# Patient Record
Sex: Female | Born: 1975
Health system: Western US, Academic
[De-identification: ages and names within clinical notes are randomized; demographics above are authoritative.]

## PROBLEM LIST (undated history)

## (undated) DIAGNOSIS — I8501 Esophageal varices with bleeding: Principal | ICD-10-CM

## (undated) DIAGNOSIS — Z5189 Encounter for other specified aftercare: Secondary | ICD-10-CM

## (undated) DIAGNOSIS — K219 Gastro-esophageal reflux disease without esophagitis: Secondary | ICD-10-CM

## (undated) DIAGNOSIS — Z09 Encounter for follow-up examination after completed treatment for conditions other than malignant neoplasm: Secondary | ICD-10-CM

## (undated) DIAGNOSIS — F32A Depression, unspecified: Secondary | ICD-10-CM

## (undated) DIAGNOSIS — F419 Anxiety disorder, unspecified: Secondary | ICD-10-CM

## (undated) DIAGNOSIS — F1020 Alcohol dependence, uncomplicated: Secondary | ICD-10-CM

## (undated) DIAGNOSIS — N920 Excessive and frequent menstruation with regular cycle: Secondary | ICD-10-CM

## (undated) DIAGNOSIS — F1010 Alcohol abuse, uncomplicated: Secondary | ICD-10-CM

## (undated) DIAGNOSIS — R011 Cardiac murmur, unspecified: Secondary | ICD-10-CM

## (undated) DIAGNOSIS — J189 Pneumonia, unspecified organism: Secondary | ICD-10-CM

## (undated) DIAGNOSIS — D649 Anemia, unspecified: Secondary | ICD-10-CM

## (undated) DIAGNOSIS — F329 Major depressive disorder, single episode, unspecified: Secondary | ICD-10-CM

## (undated) DIAGNOSIS — D61818 Other pancytopenia: Secondary | ICD-10-CM

## (undated) DIAGNOSIS — K922 Gastrointestinal hemorrhage, unspecified: Secondary | ICD-10-CM

## (undated) DIAGNOSIS — Z9289 Personal history of other medical treatment: Secondary | ICD-10-CM

## (undated) DIAGNOSIS — K766 Portal hypertension: Secondary | ICD-10-CM

## (undated) DIAGNOSIS — K7460 Unspecified cirrhosis of liver: Secondary | ICD-10-CM

## (undated) HISTORY — DX: Alcohol dependence, uncomplicated: F10.20

## (undated) HISTORY — DX: Encounter for other specified aftercare: Z51.89

## (undated) HISTORY — DX: Cardiac murmur, unspecified: R01.1

## (undated) HISTORY — DX: Major depressive disorder, single episode, unspecified: F32.9

## (undated) HISTORY — DX: Pneumonia, unspecified organism: J18.9

## (undated) HISTORY — DX: Anxiety disorder, unspecified: F41.9

## (undated) HISTORY — PX: CHOLECYSTECTOMY: SHX55

## (undated) HISTORY — DX: Depression, unspecified: F32.A

## (undated) NOTE — Progress Notes (Signed)
 Formatting of this note is different from the original. CASE MANAGEMENT ACUTE TRANSFER NOTE   Patient stable for transfer: Stable for ED Transfer?: Yes (++) Stabillity Determined by ED Physician (Time): 1537 Name of ED Physician: Dr. Hodges Diagnosis/PMH: Pneumonia Reason for ED Transfer: Reason for ED Transfer : Non-Member Type of bed requested: Type of Bed Requested: Telemetry Financial Counselor informed for payer identification: Artist Informed for Payer Identification: Yes (++) Health Plan Name and Phone Number: No insurance found according to the financial counselor   Patient has Medicare FFS?:  (No)    1727 Call to the financial counselor, spoke with Claud who communicated he has not been able to verify any insurance via [employer] site and nothing showed via pt's social security number.  Provided above information regarding patient stability and request for transfer.  Per Dr. Hodges  1730 Call to the [facility] at [phone] s/w coordinator #96 Elsie who communicated they are closed to transfers due to capacity.   1813 Call from the financial counselor who communicated pt has been confirmed homeless so will provide homeless medical assistance for pt.    Updated primary MD : Name:Dr. Boyarsky to above plan.   Please call case manager for any questions/changes in patient's status.   Case manager to remain available for any emerging needs/issues.   -------------------------------------------------------------------------------------------------------------------  In HOSPITAL CHART ONLY: Click here to launch flowsheet to view complete UM CM Assessment/Discharge Plan.  This link is not intended to be used in Chart Review.   Electronically signed by: OMAR GORMAN GRIFFIN RN Case Manager 01/06/2023 5:26 PM  Electronically signed by Griffin Omar GORMAN (R.N.), R.N. at 01/06/2023  6:14 PM PST
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
## (undated) NOTE — Progress Notes (Signed)
 Formatting of this note might be different from the original. SUMMARY OF PATIENT PROGRESS:  STATUS: Improving  Primary Problem: Covid PNE  Key findings related to primary problem: Pt is alert and oriented x 4   Other key findings: Pt ambulates around room independently   Pt is stating she feels fine without oxygen, 92% RA . Nurse provided education Pt will be d/c to family house , need for oxygen CM and MD working on d/c and making sure she has everything she needs  Discharge will have to be tomorrow due to her personal insurance being closed today    Problem: Adult Inpatient Plan of Care Goal: Plan of Care Review Outcome: Progressing Goal: Patient-Specific Goal (Individualized) Outcome: Progressing Goal: Absence of Hospital-Acquired Illness or Injury Outcome: Progressing Goal: Optimal Comfort and Wellbeing Outcome: Progressing  Problem: Alcohol  Withdrawal Goal: Alcohol  Withdrawal Symptom Control Outcome: Progressing Goal: Optimal Neurologic Function Outcome: Progressing Goal: Readiness for Change Identified Outcome: Progressing  Problem: Sepsis/Septic Shock Goal: Optimal Coping Outcome: Progressing Goal: Absence of Bleeding Outcome: Progressing Goal: Blood Glucose Level Within Targeted Range Outcome: Progressing Goal: Absence of Infection Signs and Symptoms Outcome: Progressing  Problem: Infection Goal: Absence of Infection Signs and Symptoms Outcome: Progressing  Problem: Fall Injury Risk Goal: Absence of Fall and Fall-Related Injury Outcome: Progressing  Problem: Confusion Acute Goal: Optimal Cognitive Function Outcome: Progressing  Problem: Pneumonia Goal: Fluid Balance Outcome: Progressing Goal: Resolution of Infection Signs and Symptoms Outcome: Progressing Goal: Effective Oxygenation and Ventilation Outcome: Progressing  Problem: HD Fall Risk Goal: Patient will remain as independent as possible. Patient will have lower fall risk, lower injury  risk, and remain safe from falls and injury Outcome: Progressing  Electronically signed by Fleeta Moulder, Shona HERO (R.N.), R.N. at 01/15/2023  4:30 PM PST Electronically signed by Fleeta Moulder, Tianna M (R.N.), R.N. at 01/15/2023  6:47 PM PST

## (undated) NOTE — ED Provider Notes (Signed)
 Associated Order(s): Paracentesis Formatting of this note is different from the original. HPI  Review of Systems  Physical Exam  Paracentesis  Date/Time: 01/26/2024 12:18 AM  Performed by: Siani, Avi Johnatan (M.D.), M.D. Authorized by: Siani, Avi Johnatan (M.D.), M.D.   Consent:    Consent obtained:  Verbal   Consent given by:  Patient   Risks discussed:  Bleeding, bowel perforation, infection and pain Universal protocol:    Patient identity confirmed:  Verbally with patient Pre-procedure details:    Procedure purpose:  Diagnostic   Preparation: Patient was prepped and draped in usual sterile fashion   Anesthesia:    Anesthesia method:  Local infiltration   Local anesthetic:  Lidocaine  1% w/o epi Procedure details:    Needle gauge:  22   Ultrasound guidance: yes     Puncture site:  R lower quadrant   Fluid appearance:  Serous   Dressing:  Adhesive bandage Post-procedure details:    Procedure completion:  Tolerated well, no immediate complications  ED Course as of 01/26/24 0018  Thu Jan 25, 2024  2010 ECG: NSR at 85 bmp, nonspecific ST abnormality, no STE or depressions , no signs of acute ischemia or life threatening arrhythmia [KB]  2150 CTA PULMONARY W CONTRAST Wet Read From the [employer] Application: Radiologist's Findings: No pulmonary embolism, consolidation, or pleural effusion. For findings regarding the abdomen, please refer to the CT abdomen and pelvis report. Radiologist: Lenton Lose [KB]  2151 CT ABD AND PELVIS W CONTRAST Wet Read From the [employer] Application: Radiologist's Findings: Cirrhotic liver. Moderate free fluid in the abdomen and pelvis. Recanalized paraumbilical vein. Left splenorenal shunt. Thickening of small bowel loops in the upper pelvis and lower abdomen. Inflammation versus findings related to hypoproteinemia. Radiologist: Lenton Lose [KB]  2152 Patient persistently hypoxic, on chart review has history of respiratory failure, no PE on CTA, possible  hypoxia secondary to worsening fluid overload [KB]  2250 US  ABD LTD IMPRESSION:  * HEPATOMEGALY.  * CIRRHOSIS.  * REVERSE FLOW IN THE MAIN PORTAL VEIN.  * THE COMMON BILE DUCT IS DILATED.  * ASCITES. Preliminary prepared by Oneil RAMAN. Fructuoso, DITSonographer-LA on  01/25/2024 10:29:06 PM. [KB]  2251 MD to MD: [place] Community: Dr. Melvern: accepts the patient [KB]  2311 MD to MD Dr. Saunders: accepts the patient [KB]  2316 [place] community has no beds so patient will be transferred to [facility] [KB]   ED Course User Index [KB] Badashova, Ksenya Konstantinovna (M.D.), M.D.    Electronically signed by Siani, Avi Johnatan (M.D.), M.D. at 01/26/2024 12:19 AM PST

## (undated) NOTE — Progress Notes (Signed)
 Formatting of this note might be different from the original. MID-SHIFT CARE PLAN  UPDATE:  Patient Problem: Covid PNA , chemical dependency  Key findings related to problem: Patient is calm, VSS, no distress noted throughout shift. Psychiatric consult completed via video. Patient is agreeable to rehabilatation. COVID isolation in place at this time. Patient is being monitored for CIWA scale, no need for ativan  throughout shift. Other medications on board to deal with the withdrawal symptoms. Will cont to monitor. Electronically signed by Alphonso Alm Kenning (R.N.), R.N. at 01/10/2023  4:21 PM PST

## (undated) NOTE — ED Notes (Signed)
 Formatting of this note might be different from the original. US  tech at pt's bedside Electronically signed by Mevelyn Pierce (R.N.), R.N. at 01/25/2024  9:34 PM PST
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
## (undated) NOTE — Progress Notes (Signed)
 Formatting of this note is different from the original. MEDICAL SOCIAL WORKER PROGRESS / FOLLOW-UP NOTE:  PRESENTING ISSUE:  30 year old female with past medical history of bipolar, depression,homelessness,history of etoh cirrhosis, esophageal varices, past pancreatitis, etoh abusepresents with etoh intoxication.  ASSESSMENT:  IA 01/09/2023.  INTERVENTION:  Phone call from [place] From [place], patient has [employer]. LCSW updated IP CM on coverage and for her to be aware as well.   PLAN: LCSW to follow   Jackolyn Kays, LCSW  Department of Social Medicine [facility] [place]  [phone]  Date: 01/10/2023 Time: 11:06 AM  Electronically signed by Kays Jackolyn Macintosh (L.C.S.W.), L.C.S.W at 01/10/2023 11:08 AM PST

## (undated) NOTE — Progress Notes (Signed)
 Formatting of this note might be different from the original. Pt axox4. CIWA, see flowsheet. On supp O2, see flowsheet. BPs soft, MD notified and aware, see orders, NS @ 100. K repleted w PO K. Call light within reach. Care continues Problem: Alcohol  Withdrawal Goal: Alcohol  Withdrawal Symptom Control Outcome: Progressing  Problem: Adult Inpatient Plan of Care Goal: Absence of Hospital-Acquired Illness or Injury Outcome: Progressing  Electronically signed by Roetta Maw (R.N.), R.N. at 01/11/2023  7:08 PM PST

## (undated) NOTE — ED Notes (Signed)
 Formatting of this note might be different from the original. PT leaving AMA; pt refused to sign the AMA form; left the room and walked out of the ER;  pt was instructed by Dr. Effie that it's dangerous for her to leave due to her chronic low oxygen rate; she was 86% when she came in; and she is on oxygen at home.  Electronically signed by Costella Quintin CROME (R.N.), R.N. at 04/05/2023 11:12 PM PDT
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
## (undated) NOTE — Progress Notes (Signed)
 Formatting of this note is different from the original. MEDICAL SOCIAL WORKER PROGRESS / FOLLOW-UP NOTE:  PRESENTING ISSUE: 88 year old female with past medical history of bipolar, depression,homelessness,history of etoh cirrhosis, esophageal varices, past pancreatitis, etoh abusepresents with etoh intoxication.   ASSESSMENT:  IA conducted 01/09/23. Patient is homeless with COVID and on 02. Spoke to sister re: dcp.   INTERVENTION: LCSW conferred with Team SW for prior interventions. Team SW to fu   PLAN: Soc med team to follow   Jackolyn Kays, LCSW  Department of Social Medicine [facility] [place]  [phone]  Date: 01/11/2023 Time: 9:15 AM  Electronically signed by Kays Jackolyn Macintosh (L.C.S.W.), L.C.S.W at 01/11/2023  9:15 AM PST

## (undated) NOTE — Progress Notes (Signed)
 Formatting of this note might be different from the original. Glucose 217 Electronically signed by Prescott Garre (R.N.), R.N. at 01/09/2023  6:55 PM PST
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
## (undated) NOTE — Discharge Summary (Signed)
 Formatting of this note is different from the original. Case Management Discharge Summary Note    DISCHARGE TO HOME  Team 11  Chart reviewed / case discussed with rounding MD.    Patient Address: [address] [place] [address] Phone:   Home Phone     [phone] Work Phone     Data Not Entered Address/Phone confirmed: Yes Transportation: Discharge Transportation: Other, specify [place]),   Medicare IM Letter Issued On: N/A   Discharge Plan: Disposition / Placement: Discharge Disposition Plan: Other, Specify (++)   Discharge Referrals: Discharge Referrals [facility]): Social Work, specify Infusion Center:   Discharge DME:    LONG VIEW OF CARE PLAN   Nutrition Resources:Nutrition: Yes (++)  Resource Provided:  ,  , Discharge Nutrition Resources: [employer] for [employer];Food Pantry,  . Details:     Exercise/Socialization: Exercise/Socialization: No Needs Identified Resource Provided:  ,  ,   Details:    Caregiver Resources: CareGiver: No Needs Identified  Resource Provided:  ,  ,   Details:    Transportation Resources: Transportation: No Needs Identified  Resource Provided:  ,  ,   Details:    AD/POLST: Advance Directive: No Needs Identified / POLST: No Needs Identified Resource Provided:   /   Details:   /    RESOURCES (other resources) :   Resource Provided:  ,  ,   Details:     CONCLUSION    01/16/2023-  CM called [employer] at [phone]. spoke with [employer] Supervisor Tania. Per Supervisor patient can self pay for oxygen.   CM spoke with patient who stated she will self pay for oxygen concentrator and portable tank.   CM provided patient with portable oxygen tank and concentrator after getting confirmation from [employer] that patient had paid co-pay.  Patient stated she will be discharging to her mother-in-law's home. Address [address] [place] [place] [address].  Discharge Disposition- Home via [facility] transportation  with DME.   Discharge plan developed with: Discharge plan developed with: Patient. Patient's post-discharge goals of care and treatment preferences considered in discharge plan.  Patient/family/caregiver in agreement and aware of plans.  Above plans communicated with DC plan communicated with: MD (++),       Name of MD: Dr. Theresa  . Case manager to remain available for any emerging discharge planning needs/issues. Please call case manager for any questions/changes in patient's status. ------------------------------------------------------------------------------------------------------------ In HOSPITAL CHART ONLY: Click here to launch flowsheet to view complete UM CM Assessment/Discharge Plan.  This link is not intended to be used in Chart Review.   Electronically signed by: RAGUEL PARKINSON RN Case Manager 01/16/2023 1:52 PM  Electronically signed by Parkinson Raguel (R.N.), R.N. at 01/16/2023  3:14 PM PST

## (undated) NOTE — Progress Notes (Signed)
 Formatting of this note might be different from the original. 1600: Called [employer] health plan [phone]) to follow up on oxygen DME orders. Confirmed patient has no out of network benefits.   Sherleen ID #: [id number] Dx code: Problem:COVID-19 PNEUMONIA [U07.1,J12.82] CPT Oxygen Codes. Procedure Description. Z8609. OXYGEN CONCENTRATOR. Z9575.   [employer] rep, Con  then assisted with getting patient an emergency inpatient authorization. Once emergency authorization has been given the oxygen can potentially be approved within 24-48 hrs once a nurse has reviewed clinicals.   Pending case #: [id number] Clincials and DMD orders faxed to: [phone]  Electronically signed by: KENNYTH LAMAR FLEETA DERECK RN Inpatient Case Manager Desk: [phone] Vocera: [phone] 01/10/2023 4:41 PM     Electronically signed by Fleeta Dereck, Kennyth Lamar (R.N.), R.N. at 01/10/2023  4:41 PM PST
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
## (undated) NOTE — Progress Notes (Signed)
 Formatting of this note might be different from the original. Glucose 173 Electronically signed by Prescott Garre (R.N.), R.N. at 01/09/2023  4:49 PM PST

## (undated) NOTE — Progress Notes (Signed)
 Formatting of this note might be different from the original. Discharge education given to patient. Questions answered. DME oxygen tank and concentrator delivered to bedside. PIV removed. Awaiting butterfly transportation   Electronically signed by Gerrit Longs (R.N.), R.N. at 01/16/2023  3:09 PM PST

## (undated) NOTE — Progress Notes (Signed)
 Formatting of this note might be different from the original. SUMMARY OF PATIENT PROGRESS:  STATUS: Improving  Primary Problem: Respiratory distress, Alcohol  Intoxication  Key findings related to primary problem: A&Ox4, VSS, afebrile, No resp distress noted, NSR, [place] 2L>94%, CIWA=4-5. Denies n/v/d/chest pain. Ambulating independently to/from bathroom, Tolerates PO regular diet and voiding spontaneously w/o difficulties.  Other key findings: Awaiting Physical therapy consultation, and DC to home.  Problem: Adult Inpatient Plan of Care Goal: Plan of Care Review Outcome: Progressing Goal: Patient-Specific Goal (Individualized) Outcome: Progressing Goal: Absence of Hospital-Acquired Illness or Injury Outcome: Progressing Goal: Optimal Comfort and Wellbeing Outcome: Progressing  Problem: Alcohol  Withdrawal Goal: Alcohol  Withdrawal Symptom Control Outcome: Progressing Goal: Optimal Neurologic Function Outcome: Progressing Goal: Readiness for Change Identified Outcome: Progressing  Problem: Sepsis/Septic Shock Goal: Optimal Coping Outcome: Progressing Goal: Absence of Bleeding Outcome: Progressing Goal: Blood Glucose Level Within Targeted Range Outcome: Progressing Goal: Absence of Infection Signs and Symptoms Outcome: Progressing  Problem: Infection Goal: Absence of Infection Signs and Symptoms Outcome: Progressing  Problem: Fall Injury Risk Goal: Absence of Fall and Fall-Related Injury Outcome: Progressing  Problem: Confusion Acute Goal: Optimal Cognitive Function Outcome: Progressing  Problem: Pneumonia Goal: Fluid Balance Outcome: Progressing Goal: Resolution of Infection Signs and Symptoms Outcome: Progressing Goal: Effective Oxygenation and Ventilation Outcome: Progressing  Problem: HD Fall Risk Goal: Patient will remain as independent as possible. Patient will have lower fall risk, lower injury risk, and remain safe from falls and injury Outcome:  Progressing  Electronically signed by Leontine Coy (R.N.), R.N. at 01/15/2023  6:35 AM PST

## (undated) NOTE — ED Provider Notes (Signed)
 Formatting of this note is different from the original. [facility] Emergency Department Physician Note  Please Note that this electronic medical record is being dictated and as such may contain transcription and typographical errors due to the limitations of the dictation/transcription software.  ASSESSMENT ALCOHOLIC CIRRHOSIS W ASCITES  (primary encounter diagnosis) GENERALIZED ABDOMINAL PAIN NAUSEA AND VOMITING SHORTNESS OF BREATH HYPOXIA  PLAN -Diagnostic Paracentesis -Pain management -O2 -Transfer  MDM 73 year old female with MHx severe alcohol  use disorder, cirrhosis, esophageal varices, HTN, thrombocytopenia, bipolar disorder, anxiety; presents with 2 weeks of worsening lower extremity and abdominal swelling and distension associated with intermittent daily emesis and exertional shortness of breath as well as worsening yellowing of skin and eyes. On exam patient with scleral icterus, jaundice, bilateral nonpitting lower extremity edema, abdomen is nondistended, soft nontender without rebound or guarding, no right upper quadrant tenderness, no CVA tenderness, cardiac and lung exam is nonfocal.  Basic labs were obtained and were remarkable for worsening direct and total bilirubin with elevation to 5.1 and 11.4 respectively. Elevated AST ALT and lipase which are stable when compared to prior. Findings are consistent with chronic alcohol  use. Patient has history of chronic respiratory failure and presents with worsening shortness of breath, here in the ED patient is hypoxic to 91% on room air, on chart review this is lower than her baseline, patient comes up on 2 L nasal cannula.  D-dimer was elevated however CTA showed no focal infiltrate, effusion or PE.  Given abdominal pain diagnostic paracentesis was performed.  Patient will require admission, Dr. Melvern from [facility] kindly accepts the patient.  Discussed with her in detail her diagnosis(es), required testing (if  needed), follow up appointments (if needed), treatment options, treatment plan and risks, benefits, and potential side effects of treatment options. ____________________________________________________________________________________  ED Course as of 01/25/24 2318  Thu Jan 25, 2024  2010 ECG: NSR at 85 bmp, nonspecific ST abnormality, no STE or depressions , no signs of acute ischemia or life threatening arrhythmia [KB]  2150 CTA PULMONARY W CONTRAST Wet Read From the [employer] Application: Radiologist's Findings: No pulmonary embolism, consolidation, or pleural effusion. For findings regarding the abdomen, please refer to the CT abdomen and pelvis report. Radiologist: Lenton Lose [KB]  2151 CT ABD AND PELVIS W CONTRAST Wet Read From the [employer] Application: Radiologist's Findings: Cirrhotic liver. Moderate free fluid in the abdomen and pelvis. Recanalized paraumbilical vein. Left splenorenal shunt. Thickening of small bowel loops in the upper pelvis and lower abdomen. Inflammation versus findings related to hypoproteinemia. Radiologist: Lenton Lose [KB]  2152 Patient persistently hypoxic, on chart review has history of respiratory failure, no PE on CTA, possible hypoxia secondary to worsening fluid overload [KB]  2250 US  ABD LTD IMPRESSION:  * HEPATOMEGALY.  * CIRRHOSIS.  * REVERSE FLOW IN THE MAIN PORTAL VEIN.  * THE COMMON BILE DUCT IS DILATED.  * ASCITES. Preliminary prepared by Oneil RAMAN. Fructuoso, DITSonographer-[place] on  01/25/2024 10:29:06 PM. [KB]  2251 MD to MD: [place] [facility]: Dr. Melvern: accepts the patient [KB]  2311 MD to MD Dr. Saunders: accepts the patient [KB]  2316 [place] [facility] has no beds so patient will be transferred to [facility] [KB]   ED Course User Index [KB] Badashova, Ksenya Konstantinovna (M.D.), M.D.   No results found. Procedures _______________________________________________________________________________________ SUBJECTIVE, OBJECTIVE, & PRESENTING INFORMATION    TRIAGE NURSE PRESENTING HISTORY Information provided by: Patient History of Present Illness: 23 y/o F here for eval for 2 weeks of chest  pain and abd distention w/ n/v, progressively getting worse, no blood in stool or emesis, afebrile.  Pt w/ low saturation, no resp distress. General Appearance-DistressLevel: MODERATE Industrial Related: NO   Sara [name] is a 74 year old female who presents with a chief complaint of CHEST PAIN and ABDOMINAL PAIN  History obtained from the patient.  If patient had a preferred language, a translator was offered If an EPRP transfer occured the note was reviewed. Any pertinent information was copied into this note.   6 year old female with MHx severe alcohol  use disorder, esophageal varices, HTN, thrombocytopenia, bipolar disorder, anxiety; presents with 2 weeks of worsening lower extremity and abdominal swelling and distension associated with intermittent daily emesis and exertional shortness of breath as well as worsening yellowing of skin and eyes. Denies hematemesis, melena, hematochezia. Denies chest pain. Denies URI symptoms, cough. Patient reports daily alcohol  use for 6 months and almost daily alcohol  use for years. Reports intermittent cocaine use when I can get it, last was weeks ago. Denies, fever, chills.   Patient Active Problem List  Diagnosis  ? SEVERE ALCOHOL  USE DISORDER  ? HX OF DEPRESSION  ? ESOPHAGEAL VARICES  ? BIPOLAR 1 DISORDER, UNSPECIFIED EPISODE  ? GENERALIZED ANXIETY DISORDER  ? HOMELESSNESS, UNSPECIFIED  ? PORTAL HTN  ? ELEVATED TRANSAMINASE  ? ALCOHOL  INTOXICATION  ? BIPOLAR 2 DISORDER, DEPRESSED EPISODE W ASSOCIATED FEATURES  ? HX OF THROMBOCYTOPENIA   I have reviewed the Past Medical History, past surgical history, medications, and social history as noted in this record.  ---------------- Review of Systems Vitals:   01/25/24 1942 01/25/24 1943 01/25/24 2024  BP: 117/69  110/79  BP Location: RA-RIGHT ARM   RA-RIGHT ARM  Pulse: 97  83  Resp: 18  17  Temp: 37.2 C (98.9 F)    SpO2: (!) 91% 92% 92%  Weight: 69.7 kg (153 lb 9.6 oz)    Height: 1.6 m (5' 3)     Physical Exam Vitals reviewed.  Constitutional:      General: She is not in acute distress.    Appearance: She is well-developed and normal weight. She is not ill-appearing, toxic-appearing or diaphoretic.  HENT:     Head: Atraumatic.  Eyes:     Extraocular Movements: Extraocular movements intact.     Pupils: Pupils are equal, round, and reactive to light.  Neck:     Vascular: No JVD.  Cardiovascular:     Rate and Rhythm: Normal rate and regular rhythm. No extrasystoles are present.    Heart sounds: Normal heart sounds. Heart sounds not distant. No murmur heard.    No friction rub. No gallop. No S3 or S4 sounds.  Pulmonary:     Effort: Pulmonary effort is normal. No tachypnea, accessory muscle usage or respiratory distress.     Breath sounds: No stridor. No decreased breath sounds, wheezing, rhonchi or rales.  Chest:     Chest wall: No tenderness or edema.  Abdominal:     Tenderness: There is no abdominal tenderness. There is no guarding or rebound.  Musculoskeletal:     Cervical back: Normal range of motion and neck supple.     Right lower leg: No tenderness. Edema (2+ bilat) present.     Left lower leg: No tenderness. Edema present.  Skin:    General: Skin is warm and dry.     Capillary Refill: Capillary refill takes less than 2 seconds.  Neurological:     General: No focal deficit present.  Mental Status: She is alert and oriented to person, place, and time.  Psychiatric:        Mood and Affect: Mood normal.        Behavior: Behavior normal.   ED COURSE Orders placed for this ED visit are as follows: Orders Placed This Encounter  ? XR CHEST 1 VIEW  ? US  ABD LTD  ? CTA PULMONARY W CONTRAST  ? CT ABD AND PELVIS W CONTRAST  ? CBC W AUTOMATED DIFFERENTIAL  ? ELECTROLYTE PANEL (NA, K, CL, CO2, ANION GAP)  ? BUN   ? CREATININE  ? GLUCOSE  ? TROPONIN I, HIGH SENSITIVITY  ? B-TYPE NATRIURETIC PEPTIDE (BNP)  ? LIPASE  ? ALT  ? AST  ? BILIRUBIN, TOTAL  ? BILIRUBIN, DIRECT  ? ALKALINE PHOSPHATASE  ? CALCIUM   ? MAGNESIUM   ? HCG, PREGNANCY, SERUM  ? URINALYSIS, AUTOMATED W REFLEX TO MICROSCOPY AND CULTURE  ? ALCOHOL  (ETOH) LEVEL, BLOOD  ? INR  ? WBC AUTO DIFF  ? D-DIMER FOR PULMONARY EMBOLISM, QUANTITATIVE  ? SARS-COV-2 (COVID-19), INFLUENZA A + B, MULTIPLEX NAA  ? LDH, PERITONEAL FLUID  ? AMYLASE, PERITONEAL FLUID  ? PROTEIN, PERITONEAL FLUID  ? CELL COUNT W DIFFERENTIAL, BODY FLUID  ? STERILE SITE CULTURE  ? GRAM STAIN  ? ALBUMIN , PERITONEAL FLUID  ? Iohexol  Inj 100 mL (OMNIPAQUE  350)  ? Lidocaine  10 mg/mL (1 %) Inj 50 mg (Xylocaine )  ? Morphine  Inj Syg 2 mg   RESULTS EKG: See progress notes CXR: See progress notes  Laboratory CBC Prior Results  Recent Labs    01/25/24 1948  WBC 4.8  NEUT 55.1  ANC 2.63  HGB 12.8  HCT 39.3  PLT 67*   WBC (x1000/mcL)  Date Value  01/25/2024 4.8  05/23/2023 3.1 (L)  04/05/2023 2.5 (L)  01/13/2023 4.2  01/12/2023 3.4 (L)   HGB (g/dL)  Date Value  96/93/7974 12.8  05/23/2023 12.5  04/05/2023 12.4  01/13/2023 13.0  01/12/2023 13.3    Electrolytes Prior Results  Recent Labs    01/25/24 1948  NA 132*  K 4.2  CL 104  CO2 20*  BUN <1  CR 0.63  GFR 111  RBS 102  CA 7.7*   NA (mEq/L)  Date Value  01/25/2024 132 (L)  05/23/2023 141  04/05/2023 140  01/13/2023 135  01/12/2023 137   K (mEq/L)  Date Value  01/25/2024 4.2  05/23/2023 3.0 (L)  04/05/2023 3.7  01/13/2023 3.9  01/12/2023 3.6   CR (mg/dL)  Date Value  96/93/7974 0.63  05/23/2023 0.60  04/05/2023 0.51  01/13/2023 0.52  01/12/2023 0.53    Liver Function Prior Results  Recent Labs    01/25/24 1948  TBILI 11.4*  ALKP 124  ALT 60*  AST 200*  LIPASE 101*   TBILI (mg/dL)  Date Value  96/93/7974 11.4 (H)  05/23/2023 6.7 (H)  01/07/2023 1.3 (H)   01/06/2023 1.1 (H)   ALT (U/L)  Date Value  01/25/2024 60 (H)  05/23/2023 80 (H)  01/07/2023 18  01/06/2023 25   AST (U/L)  Date Value  01/25/2024 200 (H)  05/23/2023 299 (H)  01/07/2023 34 (H)  01/06/2023 51 (H)   LIPASE (U/L)  Date Value  01/25/2024 101 (H)  05/23/2023 113 (H)  01/06/2023 75 (H)    Cardiac Testing Prior Results  Recent Labs    01/25/24 1948  TROP 4  BNP 49   TROP (pg/mL)  Date Value  01/25/2024 4  04/05/2023 <  2   BNP (pg/mL)  Date Value  01/25/2024 49  04/05/2023 28  01/06/2023 14     Other Prior Results  Recent Labs    01/25/24 1948  INR 1.6*   No results for input(s): LACTATE in the last 72 hours. Recent Labs    01/25/24 1948  DDIMER 3.16*   INR (no units)  Date Value  01/25/2024 1.6 (H)  05/23/2023 1.2  01/07/2023 1.2  01/06/2023 1.1    COVID  COVID19  Date Value Ref Range Status  01/25/2024 Not Detected Not Detected Final  05/23/2023 Not Detected Not Detected Final  04/05/2023 Not Detected Not Detected Final  01/06/2023 Detected (A) Not Detected Final    Other Radiologic Studies See progress notes  I have reviewed the labs and imaging with the patient and/or family they are aware of the results   Electronically signed by: MARLA MARLA BLEND MD 01/25/2024 10:57 PM  Electronically signed by Badashova, Ksenya Konstantinovna (M.D.), M.D. at 01/25/2024 10:57 PM PST Electronically signed by Badashova, Ksenya Konstantinovna (M.D.), M.D. at 01/26/2024  2:56 AM PST

## (undated) NOTE — Progress Notes (Signed)
 Formatting of this note might be different from the original. SUMMARY OF PATIENT PROGRESS:  STATUS: Improving  Primary Problem: Covid PNA, ETOH withdrawal   Key findings related to primary problem: COVID +, requiring oxygen supplement  Other key findings:  Hx of ETOH withdrawal, bipolar disorder, depression   Problem: Alcohol  Withdrawal Goal: Alcohol  Withdrawal Symptom Control Outcome: Progressing Intervention: Minimize or Manage Alcohol  Withdrawal Symptoms Flowsheets Taken 01/10/2023 0000 Aspiration Precautions:  upright posture maintained  awake/alert before oral intake Seizure Precautions:  emergency equipment at bedside  clutter-free environment maintained Taken 01/09/2023 2000 Sensory Stimulation Regulation: television on Goal: Optimal Neurologic Function Outcome: Progressing Intervention: Minimize or Manage Acute Neurologic Symptoms Flowsheets Taken 01/09/2023 2000 by Sharyl Needle (R.N.), R.N. Sensory Stimulation Regulation: television on Cerebral Perfusion Promotion: blood pressure monitored Taken 01/08/2023 0800 by Jose, Jinol (R.N.), R.N. Airway/Ventilation Management:  airway patency maintained  pulmonary hygiene promoted Intervention: Prevent Seizure-Related Injury Flowsheets Taken 01/10/2023 0000 by Sharyl Needle (R.N.), R.N. Seizure Precautions:  emergency equipment at bedside  clutter-free environment maintained Taken 01/08/2023 0800 by Jose, Jinol (R.N.), R.N. Aspiration Prevention During Seizure: positioned on side during seizure Goal: Readiness for Change Identified Outcome: Progressing Intervention: Promote Psychosocial Wellbeing Flowsheets (Taken 01/10/2023 0000) Family/Support System Care: presence promoted Intervention: Partner to Facilitate Behavior Change Flowsheets (Taken 01/10/2023 0000) Supportive Measures: active listening utilized  Problem: Fall Injury Risk Goal: Absence of Fall and Fall-Related Injury Outcome:  Progressing Intervention: Identify and Manage Contributors Flowsheets Taken 01/10/2023 0000 Medication Review/Management: medications reviewed Taken 01/09/2023 2000 Self-Care Promotion: independence encouraged Intervention: Promote Injury-Free Environment Flowsheets (Taken 01/10/2023 0000) Safety Promotion/Fall Prevention:  fall prevention program maintained  safety round/check completed  Problem: Pneumonia Goal: Fluid Balance Outcome: Progressing Intervention: Monitor and Manage Fluid Balance Flowsheets (Taken 01/09/2023 2000) Fluid/Electrolyte Management: electrolyte supplement initiated Goal: Resolution of Infection Signs and Symptoms Outcome: Progressing Intervention: Prevent Infection Progression Flowsheets Taken 01/10/2023 0000 Isolation: Enhanced Respiratory and Contact Precautions Infection Management: aseptic technique maintained Taken 01/09/2023 2000 Fever Reduction/Comfort Measures: lightweight bedding Goal: Effective Oxygenation and Ventilation Outcome: Progressing Intervention: Promote Airway Secretion Clearance Flowsheets Taken 01/09/2023 2000 by Sharyl Needle (R.N.), R.N. Cough And Deep Breathing: done independently per patient Taken 01/06/2023 2000 by Bertina Gilles Raoul Leandro (R.N.), R.N. Breathing Techniques/Airway Clearance: deep/controlled cough encouraged Intervention: Optimize Oxygenation and Ventilation Flowsheets Taken 01/10/2023 0000 by Sharyl Needle (R.N.), R.N. Head of Bed [facility]) Positioning: HOB at 30 degrees Taken 01/08/2023 0800 by Jose, Jinol (R.N.), R.N. Airway/Ventilation Management:  airway patency maintained  pulmonary hygiene promoted  Electronically signed by Sharyl Needle (R.N.), R.N. at 01/10/2023  1:32 AM PST

## (undated) NOTE — ED Notes (Signed)
 Formatting of this note might be different from the original. Patient eating at this time. No signs of nausea or vomiting. Still wanted ondansetron  which was given. Electronically signed by Arana, Norberto (R.N.), R.N. at 01/06/2023  2:52 PM PST
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
## (undated) NOTE — Progress Notes (Signed)
 Formatting of this note might be different from the original. Discharge Note:   Pt is non-kaiser and CM is waiting to hear back from her health plan about covering cost of oxygen, however the [place] office is closed today.   Conditional d/c will not yet be released until this is sorted  Electronically signed by Fleeta Moulder, Tianna M (R.N.), R.N. at 01/15/2023  4:12 PM PST

## (undated) NOTE — Progress Notes (Signed)
 Formatting of this note might be different from the original.  Problem: Adult Inpatient Plan of Care Goal: Plan of Care Review Outcome: Progressing Goal: Patient-Specific Goal (Individualized) Outcome: Progressing Goal: Absence of Hospital-Acquired Illness or Injury Outcome: Progressing Goal: Optimal Comfort and Wellbeing Outcome: Progressing  Problem: Alcohol  Withdrawal Goal: Alcohol  Withdrawal Symptom Control Outcome: Progressing Goal: Optimal Neurologic Function Outcome: Progressing Goal: Readiness for Change Identified Outcome: Progressing  Problem: Sepsis/Septic Shock Goal: Optimal Coping Outcome: Progressing Goal: Absence of Bleeding Outcome: Progressing Goal: Blood Glucose Level Within Targeted Range Outcome: Progressing Goal: Absence of Infection Signs and Symptoms Outcome: Progressing  Problem: Infection Goal: Absence of Infection Signs and Symptoms Outcome: Progressing  Problem: Fall Injury Risk Goal: Absence of Fall and Fall-Related Injury Outcome: Progressing  Problem: Confusion Acute Goal: Optimal Cognitive Function Outcome: Progressing  Problem: Pneumonia Goal: Fluid Balance Outcome: Progressing Goal: Resolution of Infection Signs and Symptoms Outcome: Progressing Goal: Effective Oxygenation and Ventilation Outcome: Progressing  Problem: HD Fall Risk Goal: Patient will remain as independent as possible. Patient will have lower fall risk, lower injury risk, and remain safe from falls and injury Outcome: Progressing  Electronically signed by Elberta Leach (R.N.), R.N. at 01/13/2023  7:01 AM PST

## (undated) NOTE — Progress Notes (Signed)
 Formatting of this note is different from the original. MEDICAL SOCIAL WORKER PROGRESS / FOLLOW-UP NOTE:  PRESENTING ISSUE:  Per chart, pt is a 23 year old female who was admitted on 01/06/2023 forCOVID-19 PNEUMONIA.  ASSESSMENT:  SW initial assessment completed on 01/09/2023.  INTERVENTION:  MSW conferred with clinical supervisor, Jacki S., regarding social work interventions provided and future interventions pt could benefit from. MSW will remain available as further needs arise.  PLAN: MSW will remain available as needed.  Sherran Bal, MSW  Department of Social Medicine [facility] [place]  [phone]  Date: 01/26/2023 Time: 11:50 AM  Electronically signed by Bal Sherran A (Msw) at 01/26/2023 11:52 AM PST

## (undated) NOTE — Progress Notes (Signed)
 Formatting of this note might be different from the original.  Problem: Adult Inpatient Plan of Care Goal: Plan of Care Review Outcome: Progressing Goal: Patient-Specific Goal (Individualized) Outcome: Progressing Goal: Absence of Hospital-Acquired Illness or Injury Outcome: Progressing Goal: Optimal Comfort and Wellbeing Outcome: Progressing  Problem: Sepsis/Septic Shock Goal: Optimal Coping Outcome: Progressing Goal: Absence of Bleeding Outcome: Progressing Goal: Blood Glucose Level Within Targeted Range Outcome: Progressing Goal: Absence of Infection Signs and Symptoms Outcome: Progressing  Problem: Infection Goal: Absence of Infection Signs and Symptoms  Electronically signed by Dichoso, Beverly (R.N.), R.N. at 01/13/2023  6:37 PM PST

## (undated) NOTE — Progress Notes (Signed)
 Formatting of this note is different from the original. MID-SHIFT CARE PLAN  UPDATE:  Patient Problem: COVID  Key findings related to problem:  0700-1200   Received report from [facility]. Skin check done. Please see the wound flow sheet.  Patient is OOB to chair and ambulated.  1200-1900  Patent desat to 85 when she walks and when she stop walking for a minutes saturation increased to 91.  Dr. Tina aware. Plan to keep her one more day before discharge home.  1700: Patient got anxious an wanted to go home, or go AMA. As per patent's sister patient is homeless and mother lives in [place]  and Mother visiting her now since she was visiting patient's aunt who is also admitted here in 2nd floor. Patient's sister Ihor said she want the patient to be placed in a rehab center if possible. Griselda's phone number added in the contact list.   Dr.Elperin talked to the patient and patient agreed to stay now.   0.5 mg ativan  given for anxiety.  Report given to [facility].   Problem: Adult Inpatient Plan of Care Goal: Plan of Care Review Outcome: Progressing Goal: Patient-Specific Goal (Individualized) Outcome: Progressing Goal: Absence of Hospital-Acquired Illness or Injury Outcome: Progressing Goal: Optimal Comfort and Wellbeing Outcome: Progressing Intervention: Monitor Pain and Promote Comfort Flowsheets (Taken 01/08/2023 1140) Pain Intervention: Positioning  Problem: Alcohol  Withdrawal Goal: Alcohol  Withdrawal Symptom Control Outcome: Progressing Goal: Optimal Neurologic Function Outcome: Progressing Intervention: Minimize or Manage Acute Neurologic Symptoms Flowsheets (Taken 01/08/2023 0800) Sensory Stimulation Regulation: care clustered Airway/Ventilation Management: ? airway patency maintained ? pulmonary hygiene promoted Cerebral Perfusion Promotion: blood pressure monitored Goal: Readiness for Change Identified Outcome: Progressing  Problem: Skin Injury Risk  Increased Goal: Skin Health and Integrity Outcome: Progressing  Problem: Sepsis/Septic Shock Goal: Optimal Coping Outcome: Progressing Goal: Absence of Bleeding Outcome: Progressing Goal: Blood Glucose Level Within Targeted Range Outcome: Progressing Goal: Absence of Infection Signs and Symptoms Outcome: Progressing Goal: Optimal Nutrition Intake Outcome: Progressing  Problem: Infection Goal: Absence of Infection Signs and Symptoms Outcome: Progressing  Problem: Fall Injury Risk Goal: Absence of Fall and Fall-Related Injury Outcome: Progressing Intervention: Promote Injury-Free Environment Flowsheets (Taken 01/08/2023 0800) Safety Promotion/Fall Prevention: ? muscle strengthening facilitated ? safety round/check completed  Problem: Confusion Acute Goal: Optimal Cognitive Function Outcome: Progressing  Problem: Pneumonia Goal: Fluid Balance Outcome: Progressing Intervention: Monitor and Manage Fluid Balance Flowsheets (Taken 01/08/2023 0800) Fluid/Electrolyte Management: fluids provided Goal: Resolution of Infection Signs and Symptoms Outcome: Progressing Goal: Effective Oxygenation and Ventilation Outcome: Progressing    Electronically signed by Jose, Jinol (R.N.), R.N. at 01/08/2023 11:41 AM PST Electronically signed by Jose, Jinol (R.N.), R.N. at 01/08/2023 11:45 AM PST Electronically signed by Jose, Jinol (R.N.), R.N. at 01/08/2023  6:34 PM PST Electronically signed by Jose, Jinol (R.N.), R.N. at 01/08/2023  6:35 PM PST Electronically signed by Jose, Jinol (R.N.), R.N. at 01/08/2023  7:29 PM PST

## (undated) NOTE — Progress Notes (Signed)
 Formatting of this note might be different from the original.  Problem: Adult Inpatient Plan of Care Goal: Plan of Care Review Outcome: Progressing Goal: Patient-Specific Goal (Individualized) Outcome: Progressing Goal: Absence of Hospital-Acquired Illness or Injury Outcome: Progressing Goal: Optimal Comfort and Wellbeing Outcome: Progressing  Problem: Alcohol  Withdrawal Goal: Alcohol  Withdrawal Symptom Control Outcome: Progressing Goal: Optimal Neurologic Function Outcome: Progressing Goal: Readiness for Change Identified Outcome: Progressing  Problem: Skin Injury Risk Increased Goal: Skin Health and Integrity Outcome: Progressing  Problem: Sepsis/Septic Shock Goal: Optimal Coping Outcome: Progressing Goal: Absence of Bleeding Outcome: Progressing Goal: Blood Glucose Level Within Targeted Range Outcome: Progressing Goal: Absence of Infection Signs and Symptoms Outcome: Progressing Goal: Optimal Nutrition Intake Outcome: Progressing  Problem: Infection Goal: Absence of Infection Signs and Symptoms Outcome: Progressing  Problem: Fall Injury Risk Goal: Absence of Fall and Fall-Related Injury Outcome: Progressing  Problem: Confusion Acute Goal: Optimal Cognitive Function Outcome: Progressing  Problem: Pneumonia Goal: Fluid Balance Outcome: Progressing Goal: Resolution of Infection Signs and Symptoms Outcome: Progressing Goal: Effective Oxygenation and Ventilation Outcome: Progressing  Problem: HD Fall Risk Goal: Patient will remain as independent as possible. Patient will have lower fall risk, lower injury risk, and remain safe from falls and injury Outcome: Progressing  Electronically signed by Prescott Garre (R.N.), R.N. at 01/09/2023 10:45 AM PST
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
## (undated) NOTE — ED Provider Notes (Signed)
 Formatting of this note is different from the original. According to established policy and procedure for this Emergency Department, I have performed a screening exam on this patient in Triage and have ordered: Orders Placed This Encounter   XR CHEST 1 VIEW   US  ABD LTD   CBC W AUTOMATED DIFFERENTIAL   ELECTROLYTE PANEL (NA, K, CL, CO2, ANION GAP)   BUN   CREATININE   GLUCOSE   TROPONIN I, HIGH SENSITIVITY   B-TYPE NATRIURETIC PEPTIDE (BNP)   LIPASE   ALT   AST   BILIRUBIN, TOTAL   BILIRUBIN, DIRECT   ALKALINE PHOSPHATASE   CALCIUM    MAGNESIUM    HCG, PREGNANCY, SERUM   URINALYSIS, AUTOMATED W REFLEX TO MICROSCOPY AND CULTURE   ALCOHOL  (ETOH) LEVEL, BLOOD   INR   These orders, and their associated results, are to be reviewed, documented, and acted upon by one of my colleagues currently working in the ED Treatment Area, who will be assigned to assess this patient hereafter as the formal evaluating and responsible physician.  CORDELLA DEWARD RUTH MD 01/25/2024 7:48 PM  Electronically signed by Ruth Cordella Deward (M.D.), M.D. at 01/25/2024  7:48 PM PST

## (undated) NOTE — Consults (Signed)
 Associated Order(s): INPATIENT SOCIAL SERVICES CONSULT Formatting of this note might be different from the original. Medical Social Worker Progress Note   Reason for Visit:  [employer]  Progress Note/Summary of Contact: SWer acknowledged consult, reviewed pt's chart and and will address consult after reviewing pt's chart.   Services/ Interventions Provided:  CSA Resources for homeless housing, food pantries and medical care provided to pt.  Plan of Care: MD and Nursing Staff will continue monitoring pt's progress.   Electronically signed by Lonna Ade B (Msw) at 01/07/2023  4:13 PM PST
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
## (undated) NOTE — Consults (Signed)
 Associated Order(s): INPATIENT WOUND THERAPY CONSULT Formatting of this note is different from the original. Images from the original note were not included.  [facility] Wound and Ostomy Nurse  WOUND Progress Notes  Admitting Diagnosis etoh intox, resp failure ?pna Room#: 3832/A  Code Status Code Status  Status Date  Full Code Fri Jan 06, 2023  4:40 PM   Length of Stay: LOS: 1 days    Reason for Consult redness on left buttock  Chart H&P  Past Medical History:  Diagnosis Date  ? HX OF DEPRESSION 04/30/2017   recurrent severe without psychosis  ? PORTAL HTN 11/07/2017  ? BIPOLAR 1 DISORDER, UNSPECIFIED EPISODE 04/19/2019  ? ESOPHAGEAL VARICES 11/07/2020  ? HOMELESSNESS, UNSPECIFIED 10/07/2022  ? SEVERE ALCOHOL  USE DISORDER 12/21/2022   Last Assessment & Plan:  Formatting of this note might be different from the original. Presented with alcohol  level over 300 Endorse drinking a bottle of vodka on 12/21/2022 Has history of withdrawal without seizures PAWSS score of 4.  S/p Librium  taper (finished on 2/5) and CIWA protocol with as needed Ativan  Daily B12 and Thiamine .  Add MVI and folic acid  daily for 5 days.  Elevated 78 and mildly  ? ACUTE RESPIRATORY FAILURE 01/06/2023  ? ELEVATED TRANSAMINASE 01/06/2023   Allergies  Allergen Reactions  ? Morphine      Problem List Principal Problem:   COVID-19 PNEUMONIA Active Problems:   SEVERE ALCOHOL  USE DISORDER     Overview: Last Assessment & Plan:      Formatting of this note might be different from the original.     Presented with alcohol  level over 300     Endorse drinking a bottle of vodka on 12/21/2022     Has history of withdrawal without seizures     PAWSS score of 4.  S/p Librium  taper (finished on 2/5) and CIWA protocol      with as needed Ativan      Daily B12 and Thiamine .      Add MVI and folic acid  daily for 5 days.      Elevated 78 and mildly confused per RN. Lactulose  10g once with improved      mentation.     Addiciton medicine  consulted and patient was started on Acamprosate  666 mg      three times a day.       Recommended the book, Alcohol  Explained by Elsie Candy.   HX OF DEPRESSION     Overview: recurrent severe without psychosis   ESOPHAGEAL VARICES   BIPOLAR 1 DISORDER, UNSPECIFIED EPISODE   HOMELESSNESS, UNSPECIFIED   PORTAL HTN   ACUTE RESPIRATORY FAILURE   ELEVATED TRANSAMINASE   ALCOHOL  INTOXICATION  Pertinent Labs  WBC'S AUTO   2.0 (L)      01/07/2023 HGB          11.1 (L)      01/07/2023 HCT AUTO     31.7 (L)      01/07/2023 PLT'S AUTO   64 (L)      01/07/2023  No results found for this basename:  HGA1C  No results found for this basename:  PREALB  ALB      3.5      01/07/2023   [place]  Sensory Perception: VERY LIMITED (01/07/23 0800) Moisture: OCCASIONALLY MOIST (01/07/23 0800) Activity: BEDFAST (01/07/23 0800) Mobility: SLIGHTLY LIMITED (01/07/23 0800) Nutrition: PROBABLY INADEQUATE (01/07/23 0800) Friction and Shear: POTENTIAL PROBLEM (01/07/23 0800) Braden Scale Total: 13 (01/07/23 0800)  Wound(s)     Media  Information  Document Information    Consulted for Left buttock erythema. Inappropriate consult. No significant wound issues seen. Left buttock not a pressure injury and erythema not on bony prominence. Erythema appears to be a rash or scratch. Right lateral foot bunion with blanchable erythema. Patient stated she is able to walk to the bathroom. Bilateral upper and lower extremities able to move without difficulty. Patient is alert, awake and oriented. No need for any wound care interventions at this time. Implement SKINN bundle prevention measures per policy.     Treatment    SKKIN Bundle Recommendations  S- Surface:    [x]  Ensure bed with low air loss therapy (has internal or external air pump) for microclimate control and pressure redistribution if patient has pressure injuries (any stage)   [x]  Use waffle seat cushion when patient is on chair   [x]   Protect patient?s skin against adverse effects of external mechanical forces such as friction, shear, and pressure. Use lift sheet or transfer device to move patient and prevent lateral sheer   [x]  Minimize layers of linen under the patient. Have no more than 2 layers of linen (excluding AirTap System slider)    K - Keep Turning:  [x]  Continue to reposition side-to-side at least every two hours (regardless of bed/ mattress type) and document refusal   [x]  Utilize 30-degree side-lying position alternating left side to right side. Position patient to avoid direct pressure over trochanter and / or directly over a pressure injury (if patient?s condition permits)   [x]  Keep HOB below 30 degrees unless contraindicated   K - Keep Protected:    [x]  Stabilize and position tubes to prevent them from creating pressure   [x]  Apply Mepilex border dressing  on sacrococcyx area, on bilateral heels and other patient-specific areas potential for skin breakdown and change every 3 days and PRN   [x]  Elevate heels off surfaces at all times, utilizing pillows to bridge calves or use TruVue boots   I - Incontinence Management:    [x]  Cleanse with EasiCleanse system wipe gently   [x]  Apply moisture barrier cream for minor/moderate skin irritation, use critic-aid clear ointment   [x]  Apply moisture barrier cream for severe skin irritation, use thin layer of Triad dressing   [x]  For worsening moisture issues, contact physician    N- Nutrition:    [x]  Encourage adequate nutrition and hydration   [x]  Consult nutritionist for wound healing as appropriate   Notification  Plan of care discussed with patient and verbalize understanding  Primary RN informed [x]  Yes RN Name_________________ Physician notified []  Yes             Name________________  Additional notes  Primary RN to report to physician if s/s wound infection are present and re-consult wound care nurse. Contact wound care RN with  questions/concerns/changes in wound condition via [facility] Wound Care Nurse    REMONIA BRIANT COWER RN [facility] 01/07/2023 3:51 PM  Electronically signed by Cower Remonia Briant (R.N.), R.N. at 01/07/2023  4:00 PM PST

## (undated) NOTE — Progress Notes (Signed)
 Formatting of this note is different from the original. CASE MANAGEMENT PROGRESS NOTE   Team: 11    Chart Reviewed/Case discussed with rounding MD.  DISCHARGE PLANNING   Updated Clinicals faxed to: [phone] with confirmation.  -------------------------------------------------------------------------------------------------------  In HOSPITAL CHART ONLY: Click here to launch flowsheet to view complete [facility] CM Assessment/Discharge Plan.  This link is not intended to be used in Chart Review.   Electronically signed by: ELROY HIGHLAND RN Case Manager 01/12/2023 8:42 PM   Electronically signed by Highland Elroy (R.N.), R.N. at 01/12/2023  9:06 PM PST

## (undated) NOTE — Initial Assessments (Signed)
 Formatting of this note is different from the original. MEDICAL NUTRITION THERAPY ASSESSMENT Malnutrition Evaluation:  Sara [name] does NOT qualify for malnutrition at this time.  . - Nutrition Focused Physical Exam:  - no significant fat or muscle loss to facial and upper body regions  Malnutrition Screening Tool Have you recently lost weight without trying?: Unsure MST Score: 2  Nutrition Recommendations:  --  Continue with current diet order  _________________________________________________________________________  NUTRITION ASSESSMENT: Reason for visit: MST score of 2  (2/19) Pt sleeping, did not wake upon visit. Chart reviewed, 11 y.o. F admitted for etoh intoxication. Pt his homeless per SW note. PO intakes are adequate (100% avg).   Admission Date:  01/06/2023 PMH: bipolar 1 disorder, esophageal varices, hx depression, severe etoh use disorder Food Allergies: No known food allergies, intolerances or religious restrictions   Nutrition-related medications: decadron , folic acid , multivitamin with folic acid , thiamine , s/p lasix  Skin: No pressure injuries noted in CWON note 2/17  GI:  Patient Vitals for the past 72 hrs:  Stool Characteristics  01/07/23 1100 Brown;Soft;Formed   Estimated body mass index is 29.09 kg/m as calculated from the following:   Height as of this encounter: 1.6 m (5' 3).   Weight as of this encounter: 74.5 kg (164 lb 3.9 oz). IBW: 52.3kg  Weight History:  Wt Readings from Last 20 Encounters:  01/06/23 74.5 kg (164 lb 3.9 oz)   Edema: 1 BLE Labs: reviewed   Current Diet Orders  (From admission to next 48h)       Start     Ordered   01/07/23 0945  Regular  NOW       Comments: WARNING: THIS IS THE COMMENTS SECTION. DIET ORDERS ARE NOT ACCEPTED IN THIS AREA.   01/07/23 0938     PO intake likely adequate  Patient Vitals for the past 72 hrs:  Diet % Taken  01/09/23 1400 100 %  01/09/23 0900 100 %  01/08/23 1800 100 %  01/08/23  1156 100 %  01/08/23 0800 100 %  01/07/23 1100 100 %  01/07/23 0900 100 %   Estimated daily nutrition needs based on ideal body weight  74.5kg Calories (Kcal/Day): 1300-1560   Calorie Based On: 25-30 KCAL/KG;IDEAL/DESIRED BODY WEIGHT Protein (Gm/Day): 63-78   Protein Based On: 1.2 - 1.5 GM/KG;IDEAL/DESIRED BODY WEIGHT Fluids (ML/Day): 1300-1560 or per MD\   Fluids Based On: 1 ML/KCAL    NUTRITION-RELATED DIAGNOSIS: No nutrition diagnosis at this time  NUTRITION INTERVENTION: Education: Not indicated at this time Coordination of care: s/w RN  MONITORING AND EVALUATION: Goal(s): 2/19 Oral intake > 50% of meal and/or tolerate supplement  Discharge planning: PO diet   Electronically signed by: Rosaline Acton, RD 01/09/2023 Ext: [id number]   Electronically signed by Acton Rosaline May (R.D.), R.D. at 01/09/2023  3:41 PM PST

## (undated) NOTE — Progress Notes (Signed)
 Formatting of this note might be different from the original. Called to bedside by RN, pt feeling anxious and wanting to leave.  Nurse notified me that pt desat to 80's when [place] removed, when [place] placed back on sats in the 90's.  Pt seen at bedside, NAD, breathing comfortably on [place], gets tearful when talking about her social situation. States she feels anxious. Agrees to ativan , prefers IV since feeling nauseas.  adivsed its not safe for her to go home since she is still requiring oxygen, she expressed understanding and willingness to stay.  Selinda Pu, DO Hospitalist [facility]    Electronically signed by Pu Selinda Sharper (D.O.), D.O. at 01/08/2023  5:27 PM PST
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
## (undated) NOTE — Progress Notes (Signed)
 Formatting of this note might be different from the original. Dr Jama here to see pt. Updated on status Electronically signed by Prescott Garre (R.N.), R.N. at 01/09/2023  1:21 PM PST
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
## (undated) NOTE — Progress Notes (Signed)
 Formatting of this note is different from the original. CASE MANAGEMENT PROGRESS NOTE   Team: 11  Chart Reviewed/Case discussed with rounding MD.   01/10/2023  Patient admitted on 01/06/2023 for Alcohol  intoxication and GI bleed, found to have Covid Pneumonia,  LOS 3 days  CM called [employer] at [phone] to inquire on obtaining portable oxygen tank and concentrator for patient.  Per staff member, Jude S. patient does not have any out of network benefits and oxygen cannot be authorized.   CM then transferred to Benefits Department, staff member informed CM patient has no out of network coverage and oxygen will not be covered.   CM met with patient at bedside to explore if she can afford to pay out of pocket for portable oxygen tank and oxygen concentrator. Patient states she currently has no income and no funds and is unable to pay out of pocket for oxygen equipment.  Patient states she is going to call her sister and explore if she can stay at her home upon hospital d/c.  Discharge Disposition- Back to community vs. Sister's home.   DISCHARGE PLANNING    Discharge Plan Status:    Explained transportation is not usually a covered benefit, unless determined medically necessary by a Physician or may be covered under your benefit package.  LONG VIEW OF CARE PLAN   Nutrition Resources:   Resource Provided:  ,  ,  ,   Details:     Exercise/Socialization:   Resource Provided:  ,  ,   Details:     Caregiver Resources:    Resource Provided:  ,  ,   Details:    Transportation Resources:    Resource Provided:  ,  ,   Details:    AD/POLST:   /   Resource Provided:   /   Details:   /    RESOURCES (other resources):   Resource Provided:  ,  ,   Details:     Medicare IM letter issued on: N/A   Discharge needs that require follow up:    CONCLUSION    Discharge plan developed with:  . Patient's post-discharge goals of care and treatment preferences considered in  discharge plan.  Patient/family/caregiver in agreement and aware of plans.  Above plans communicated with:  ,          .         Case manager to remain available for any emerging discharge planning needs/issues. Please call case manager for any questions/changes in patient's status.   -------------------------------------------------------------------------------------------------------  In HOSPITAL CHART ONLY: Click here to launch flowsheet to view complete [facility] CM Assessment/Discharge Plan.  This link is not intended to be used in Chart Review.   Electronically signed by: RAGUEL PARKINSON RN Case Manager 01/10/2023 3:25 PM  Electronically signed by Parkinson Raguel (R.N.), R.N. at 01/10/2023  4:30 PM PST

## (undated) NOTE — Progress Notes (Signed)
 Formatting of this note is different from the original. SUMMARY OF PATIENT PROGRESS:  STATUS: Improving  Primary Problem: refractory hypotension (resolved)  Key findings related to primary problem: Etoh intoxication, levophed  off 0500.   Other key findings: PMHx bipolar d/o depression,homelessness,history of etoh cirrhosis, esophageal varices, past pancreatitis, alcohol  abuse.  1100: independent with ADLs. Consumed 100% of breakfast and snack. Afebrile. CIWA-A 0  1130. Patient able to ambulate to and from [facility] with/o difficulty.  BP stable.  Patient has order for Step down.  Report given to J. McDuffie RN  Electronically signed by: DORRINE CLEOTIS LEOPARD RN 01/07/2023 11:32 AM    Electronically signed by Leopard Dorrine Cleotis (R.N.), R.N. at 01/07/2023  1:33 PM PST
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
## (undated) NOTE — ED Notes (Signed)
 Formatting of this note might be different from the original. Paged xray for cd Electronically signed by Lenox Ensign at 01/25/2024 11:11 PM PST

## (undated) NOTE — Progress Notes (Signed)
 Formatting of this note is different from the original. CASE MANAGEMENT PROGRESS NOTE   Team: 11   Chart Reviewed/Case discussed with rounding MD.  1537: Called [employer] health plan [phone]) to follow up on Emergency inpatient authorization and oxygen DME orders.   Sherleen ID #: [id number] Dx code: Problem:COVID-19 PNEUMONIA [U07.1,J12.82] CPT Oxygen Codes. Procedure Description. Z8609. OXYGEN CONCENTRATOR. Z9575.   [employer] rep, Levy R. stated clinicals received and emergency authorization is currently pending.  CM will continue to follow up with [employer] on status of request.   Pending case #: [id number]   Electronically signed by Hezzie Bleacher (R.N.), R.N. at 01/11/2023  3:39 PM PST Electronically signed by Hezzie Bleacher (R.N.), R.N. at 01/11/2023  3:48 PM PST

## (undated) NOTE — ED Notes (Signed)
 Formatting of this note might be different from the original. Report received by Lauren RN who will assume pt care to assigned hospital. Pt aox4, responsive. Shows no signs of distress. Equal chest rise and fall. Verbalizes no needs at this time. Pt aware of transfer to another facility. Plan of care ongoing.  Electronically signed by Melene Maryl Clarine Mardeen (R.N.), R.N. at 01/26/2024  1:03 AM PST

## (undated) NOTE — Progress Notes (Signed)
 Formatting of this note might be different from the original. Pt axox4. CIWA. On supp o2, see flowsheet. Chest x ray, see results. Isol precautions. Call light within reach. Care continues.  Problem: Alcohol  Withdrawal Goal: Optimal Neurologic Function Outcome: Progressing  Problem: Alcohol  Withdrawal Goal: Readiness for Change Identified Outcome: Progressing  Problem: Adult Inpatient Plan of Care Goal: Plan of Care Review Outcome: Progressing  Problem: Alcohol  Withdrawal Goal: Optimal Neurologic Function Outcome: Progressing Goal: Readiness for Change Identified Outcome: Progressing  Electronically signed by Roetta Maw (R.N.), R.N. at 01/12/2023  6:48 PM PST

## (undated) NOTE — Progress Notes (Signed)
 Formatting of this note is different from the original.   TRANSFER INFORMATION NOTE   Bed secured:    yes  Consent to transfer:    Yes  Accepting hospital name: [facility] Address: [address] [place] [address] Room/Bed #: [phone] Phone number for report: [phone]                   Accepting MD: Dr. Saunders               Ambulance:   ALS, cardiac monitor  arranged through: [facility] Phone #: [phone] (AUTH for transportation from [facility]a [phone])  Pick up time: 0130 Bed confirmed with: Steffan   CASE MANAGEMENT ACUTE TRANSFER NOTE   Patient stable for transfer: Stable for ED Transfer?: Yes (++) Stabillity Determined by ED Physician (Time): 2203 Name of ED Physician: Sheldon Garlon Plant (M.D. Diagnosis/PMH: Hypoxia, alcoholic cirrhosis, ascites  Reason for ED Transfer: Reason for ED Transfer : Non-Member Type of bed requested: Type of Bed Requested: Telemetry Financial Counselor informed for payer identification: Artist Informed for Payer Identification: Yes (++)        [facility] Medi-Cal; IPA: [facility]; Hosp: [facility]; Member ID: [id number] G  2205 Stable for transfer; financial clearance in progress.   2220 [facility] paged.    2250 Received call from [employer]; transfer acknowledged. Pending peer to peer with Dr. Clotilda.   2251 MD to MD: [place] [facility]: Dr. Melvern: accepts the patient .   2311 [facility] no longer has a female bed available per Steffan. Patient is [facility] being [facility] to [facility]; pending peer to peer.   2316 Dr. Saunders accepts at [facility].   2329 Patient agrees to transfer.   Please call case manager for any questions/changes in patient's status.  Case manager to remain available for any emerging needs/issues.  Case Manager discussed transfer to hospital with patient/family, and  agreement/consent to transfer granted.  Patient's goals of care and treatment preferences considered in transfer. Patient/family updated with above information. Transfer form signed and completed.  MD to MD communication done.  Above plans communicated with          . Per MD, patient remains stable for transfer at this time. Defer to assigned RN/ward clerk to provide copies of patient's medical records including imaging studies if appropriate.  RN advised to give RN report.  Please call Case Manager for any questions/changes in patient's status. Case manager to remain available for any emerging needs/issues.   -------------------------------------------------------------------------------------------------------------------  In HOSPITAL CHART ONLY: Click here to launch flowsheet to view complete [facility] CM Assessment/Discharge Plan.  This link is not intended to be used in Chart Review.   Electronically signed by: ALDEAN CHRISTELLA RHEA RN Case Manager 01/25/2024 10:04 PM     Electronically signed by Rhea Aldean CHRISTELLA (R.N.), R.N. at 01/26/2024 12:16 AM PST Electronically signed by Rhea Aldean CHRISTELLA (R.N.), R.N. at 01/26/2024 12:23 AM PST

## (undated) NOTE — ED Notes (Signed)
 Formatting of this note might be different from the original. Pt taken to CT by ERA Electronically signed by Mevelyn Pierce (R.N.), R.N. at 01/25/2024  9:17 PM PST

## (undated) NOTE — Progress Notes (Signed)
 Formatting of this note might be different from the original. MEDICAL SOCIAL WORKER PROGRESS / FOLLOW-UP NOTE:  PRESENTING ISSUE:  Per chart, pt is a 41 year old female who was admitted on 01/06/2023 for COVID-19 PNEUMONIA.  ASSESSMENT:  SW initial assessment completed on 01/09/2023.  INTERVENTION:  MSW received call from RN in order to confer on pt's case for discharge. Per RN, was informed pt was homeless but pt provided address to sister's home to be transported to. MSW informed RN that homeless discharge checklist is not needed if pt will be going back home with family. Per RN, denied additional SW needs at this time and appreciated efforts made. Per RN, pt will be discharged today and transportation will be arranged to address provided by patient.  PLAN: MSW will remain available as needed.  Sherran Bal, MSW  Department of Social Medicine [facility] [place]  [phone]  Date: 01/16/2023 Time: 4:04 PM  Electronically signed by Bal Sherran A (Msw) at 01/16/2023  4:12 PM PST

## (undated) NOTE — ED Provider Notes (Signed)
 Formatting of this note is different from the original. Images from the original note were not included.   [facility] EMERGENCY DEPARTMENT NOTE  Name: Sara [name] MRN: [id number] DOB: Dec 06, 1975 PCP:: No primary care provider on file.  CC: ABDOMINAL PAIN  Triage: History of Present Illness: Pt walk in c/o mid abd pain with diarrhea x5 days. denies vomiting. last alcohol  intake was last night  HISTORY OF PRESENT ILLNESS   Sara [name] is a 84 year old female EtOH cirrhosis c/b esophageal varices, AUD (last drinking last evening), portal HTN who presents for an evaluation for abdominal pain.   Mid abdominal pain with non-bloody non-dark diarrhea x 5 days. Non-radiating, no medications tried.  No fever, vomiting, urinary symptoms, back pain, vaginal bleeding/discharge.   ROS otherwise negative.   PAST MEDICAL HISTORY   Past Medical History: Past Medical History:  Diagnosis Date   HX OF DEPRESSION 04/30/2017   recurrent severe without psychosis   PORTAL HTN 11/07/2017   BIPOLAR 1 DISORDER, UNSPECIFIED EPISODE 04/19/2019   ESOPHAGEAL VARICES 11/07/2020   HOMELESSNESS, UNSPECIFIED 10/07/2022   SEVERE ALCOHOL  USE DISORDER 12/21/2022   Last Assessment & Plan:  Formatting of this note might be different from the original. Presented with alcohol  level over 300 Endorse drinking a bottle of vodka on 12/21/2022 Has history of withdrawal without seizures PAWSS score of 4.  S/p Librium  taper (finished on 2/5) and CIWA protocol with as needed Ativan  Daily B12 and Thiamine .  Add MVI and folic acid  daily for 5 days.  Elevated 78 and mildly   ACUTE RESPIRATORY FAILURE 01/06/2023   ELEVATED TRANSAMINASE 01/06/2023   COVID-19 PNEUMONIA 01/06/2023   ACUTE RESPIRATORY FAILURE 01/06/2023   Current Medications: Current Facility-Administered Medications  Medication Route Frequency Provider   chlordiazePOXIDE  Cap 50 mg Oral X1 Billah, Tausif B (M.D.), M.D.   Current Outpatient Medications   Medication Sig Dispense Refill   chlordiazePOXIDE  25 mg Oral Cap Take 2 capsules by mouth every 6 hours for 4 doses, then 1 capsule by mouth every 6 hours for 8 doses. Do not mix with alcohol . May reduce dose if too sedating 16 capsule 0   Dicyclomine (BENTYL) 20 mg Oral Tab Take 1 tablet by mouth 4 times a day as needed for abdominal pain or cramps 30 tablet 0   Ibuprofen  (MOTRIN ) 600 mg Oral Tab Take 1 tablet by mouth every 8 hours as needed for pain . Take with food 30 tablet 0   Acamprosate  (CAMPRAL ) 333 mg Oral TBEC DR Tab Take 2 tablets by mouth 3 times a day 180 tablet 0   Escitalopram  (LEXAPRO ) 10 mg Oral Tab Take 2 tablets by mouth daily 30 tablet 0   Gabapentin  (NEURONTIN ) 100 mg Oral Cap Take 4 capsules by mouth 3 times a day 360 capsule 0   Lactulose  10 gram/15 mL Oral Soln Take 15 mL by mouth 3 times a day as needed for constipation 90 mL 0   Topiramate  (TOPAMAX ) 25 mg Oral Tab Take 2 tablets by mouth 2 times a day 120 tablet 0   Multivitamin (DAILY-VITE) Oral Tab Take 1 tablet by mouth daily 100 tablet 0   traZODone  (DESYREL ) 50 mg Oral Tab Take one-half tablet by mouth at bedtime as needed for insomnia 30 tablet 0   Famotidine  (PEPCID ) 20 mg Oral Tab Take 2 tablets by mouth 2 times a day 100 tablet 0   Ibuprofen  (MOTRIN ) 400 mg Oral Tab Take 1 tablet by mouth every 6 hours  as needed for pain . Take with food 30 tablet 0   Famotidine  (PEPCID  AC MAXIMUM STRENGTH) 20 mg Oral Tab Take 2 tablets by mouth 2 times a day 100 tablet 0   thiamine  mononitrate, vit B1, 50 mg Oral Tab Take 100 mg by mouth daily     Albuterol  (PROAIR /PROVENTIL /VENTOLIN ) 90 mcg/actuation Inhl HFAA Inhale 2 Puffs by mouth every 6 hours as needed     Fludrocortisone  (FLORINEF ) 0.1 mg Oral Tab Take 1 tablet by mouth daily (Patient not taking: Reported on 01/06/2023)     FLUoxetine  (PROZAC ) 20 mg Oral Cap Take 1 capsule by mouth daily (Patient not taking: Reported on 01/06/2023) 30 capsule 0   Folic Acid  1 mg Oral Tab  Take 1 tablet by mouth daily (Patient not taking: Reported on 01/06/2023)     Midodrine  (PROAMATINE ) 10 mg Oral Tab Take 1 tablet by mouth 3 times a day (Patient not taking: Reported on 05/23/2023)     OLANZapine (ZYPREXA) 2.5 mg Oral Tab Take 1 tablet by mouth daily as needed     Pantoprazole  (PROTONIX ) 40 mg Oral TBEC DR Tab Take 1 tablet by mouth daily 30 minutes before breakfast (Patient not taking: Reported on 01/06/2023)     QUEtiapine  (SEROQUEL ) 100 mg Oral Tab Take 1 tablet by mouth every evening     Allergies: Allergies  Allergen Reactions   Morphine     Social History: Social History   Socioeconomic History   Marital status: Separated    Spouse name: No social history on file   Number of children: No social history on file   Years of education: No social history on file   Highest education level: No social history on file  Occupational History   No social history on file  Tobacco Use   Smoking status: No social history on file   Smokeless tobacco: No social history on file  Vaping Use   Vaping status: No social history on file  Substance and Sexual Activity   Alcohol  use: Yes    Comment: PER PT CHART. PT DRINKS DAYLI. UNKNOWN WHAT DRINKS   Drug use: No social history on file    Comment: uto. pt lethargic. non verbal. no family at the bedside.   Sexual activity: No social history on file    Comment: uto. pt lethargic. non verbal. no family at the bedside.  Other Topics Concern   No social history on file  Social History Narrative   No social history on file   REVIEW OF SYSTEMS   Review of Systems  Constitutional:  Negative for activity change, appetite change, chills and fever.  HENT:  Negative for ear pain, rhinorrhea, sore throat, tinnitus and voice change.   Eyes:  Negative for pain, discharge and visual disturbance.  Respiratory:  Negative for cough, chest tightness, shortness of breath and wheezing.   Cardiovascular:  Negative for chest pain, palpitations and  leg swelling.  Gastrointestinal:  Positive for abdominal pain and diarrhea. Negative for blood in stool, nausea and vomiting.  Genitourinary:  Negative for dysuria, flank pain, frequency and urgency.  Musculoskeletal:  Negative for back pain, neck pain and neck stiffness.  Skin:  Negative for color change, rash and wound.  Neurological:  Negative for dizziness, syncope, weakness, light-headedness, numbness and headaches.   PHYSICAL EXAM   Vitals:   07/03/24 0626  BP: 102/73  BP Location: RA-RIGHT ARM  Pulse: 99  Resp: 18  Temp: 36.7 C (98 F)  SpO2: 95%  Weight: 69.9  kg (154 lb)  Height: 1.6 m (5' 3)   Physical Exam Constitutional:      General: She is not in acute distress.    Appearance: She is not ill-appearing.  HENT:     Head: Normocephalic and atraumatic.     Right Ear: External ear normal.     Left Ear: External ear normal.     Nose: Nose normal.     Mouth/Throat:     Mouth: Mucous membranes are moist.     Pharynx: No oropharyngeal exudate or posterior oropharyngeal erythema.  Eyes:     General:        Right eye: No discharge.        Left eye: No discharge.     Extraocular Movements: Extraocular movements intact.     Pupils: Pupils are equal, round, and reactive to light.  Cardiovascular:     Rate and Rhythm: Normal rate and regular rhythm.     Pulses: Normal pulses.     Heart sounds: Normal heart sounds.  Pulmonary:     Effort: Pulmonary effort is normal. No respiratory distress.     Breath sounds: Normal breath sounds. No stridor. No wheezing, rhonchi or rales.  Abdominal:     General: Abdomen is flat. There is no distension.     Palpations: Abdomen is soft.     Tenderness: There is abdominal tenderness. There is no right CVA tenderness or left CVA tenderness.  Musculoskeletal:        General: No swelling, tenderness or deformity. Normal range of motion.     Cervical back: Normal range of motion and neck supple. No rigidity.     Right lower leg: No  edema.     Left lower leg: No edema.  Skin:    General: Skin is warm.     Capillary Refill: Capillary refill takes less than 2 seconds.     Findings: No lesion or rash.  Neurological:     General: No focal deficit present.     Mental Status: She is alert and oriented to person, place, and time.     Cranial Nerves: No cranial nerve deficit.     Sensory: No sensory deficit.     Motor: No weakness.     Coordination: Coordination normal.     Gait: Gait normal.  Psychiatric:        Mood and Affect: Mood normal.        Behavior: Behavior normal.        Thought Content: Thought content normal.     Procedures  DIAGNOSTIC TESTING   Orders Placed This Encounter   CT ABD AND PELVIS W CONTRAST   CBC W AUTOMATED DIFFERENTIAL   ELECTROLYTE PANEL (NA, K, CL, CO2, ANION GAP)   BUN   CREATININE   GLUCOSE   LIPASE   ALT   AST   ALKALINE PHOSPHATASE   BILIRUBIN, DIRECT   BILIRUBIN, TOTAL   HCG, PREGNANCY, SERUM   WBC AUTO DIFF   Ketorolac  Inj 15 mg (TORADOL )   Potassium Chloride  Oral Pack 40 mEq (K-LOR/KLOR-CON )   Iohexol  Inj 75 mL (OMNIPAQUE  300)   chlordiazePOXIDE  25 mg Oral Cap   Dicyclomine (BENTYL) 20 mg Oral Tab   chlordiazePOXIDE  Cap 50 mg   Labs: CBC Prior Results  Recent Labs    07/03/24 0630  WBC 5.4  NEUT 20.4  HGB 12.3  HCT 37.9  PLT 48*   WBC (x1000/mcL)  Date Value  07/03/2024 5.4  01/25/2024 4.8  05/23/2023  3.1 (L)  04/05/2023 2.5 (L)  01/13/2023 4.2   HGB (g/dL)  Date Value  91/86/7974 12.3  01/26/2024 10.4 (L)  01/25/2024 12.8  05/23/2023 12.5  04/05/2023 12.4    Electrolytes Prior Results  Recent Labs    07/03/24 0630  NA 141  K 3.0*  CL 111  CO2 22  BUN 4  CR 0.46  GFR 119  RBS 96   NA (mEq/L)  Date Value  07/03/2024 141  01/25/2024 132 (L)  05/23/2023 141  04/05/2023 140  01/13/2023 135   K (mEq/L)  Date Value  07/03/2024 3.0 (L)  01/25/2024 4.2  05/23/2023 3.0 (L)  04/05/2023 3.7  01/13/2023 3.9   CR (mg/dL)   Date Value  91/86/7974 0.46  01/25/2024 0.63  05/23/2023 0.60  04/05/2023 0.51  01/13/2023 0.52    Liver Function Prior Results  Recent Labs    07/03/24 0630  TBILI 4.7*  ALKP 134*  ALT 29  AST 87*  LIPASE 92*   TBILI (mg/dL)  Date Value  91/86/7974 4.7 (H)  01/25/2024 11.4 (H)  05/23/2023 6.7 (H)  01/07/2023 1.3 (H)  01/06/2023 1.1 (H)   ALT (U/L)  Date Value  07/03/2024 29  01/25/2024 60 (H)  05/23/2023 80 (H)  01/07/2023 18  01/06/2023 25   AST (U/L)  Date Value  07/03/2024 87 (H)  01/25/2024 200 (H)  05/23/2023 299 (H)  01/07/2023 34 (H)  01/06/2023 51 (H)   LIPASE (U/L)  Date Value  07/03/2024 92 (H)  01/25/2024 101 (H)  05/23/2023 113 (H)  01/06/2023 75 (H)    Cardiac Testing Prior Results  No results for input(s): TROP, BNP in the last 72 hours. TROP (pg/mL)  Date Value  01/25/2024 4  04/05/2023 <2   BNP (pg/mL)  Date Value  01/25/2024 49  04/05/2023 28  01/06/2023 14     Other Prior Results  No results for input(s): INR in the last 72 hours. No results for input(s): LACTATE in the last 72 hours. No results for input(s): DDIMER in the last 72 hours. INR (no units)  Date Value  01/25/2024 1.6 (H)  05/23/2023 1.2  01/07/2023 1.2  01/06/2023 1.1    Urinalysis  No results for input(s): USG, ULEUKESTER, UNITRITE, UPROTEIN in the last 72 hours. No results for input(s): UAGLU, UKET, UROBILINOGEN, UABILI in the last 72 hours. No results for input(s): UAHGB, UWBC, URBC, UEPITH, UBACT, UPH in the last 72 hours.   No data found.  Imaging: CT Abdomen Pelvis: IMPRESSION  1.  Cirrhosis and portal hypertension. No drainable ascites. 2.  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 3.  Prior cholecystectomy. 4.  Normal appendix.    This report electronically signed by ALM ALICE on 07/03/2024 7:56 AM  ED COURSE & MEDICAL  DECISION MAKING   56 year old female with abdominal pain in setting of alcohol  use complicated by cirrhosis and varices. No vomiting, only non-bloody, non-dark diarrhea. With abdominal TTP but no acute findings on CTAP, RUQ US  not obtained as patient has prior cholecystectomy. Elevated Bilirubin, AST, ALT, Alk Phos and Lipase baseline elevated, likely c/w patients known EtOH cirrhosis. Potassium of 3 repleted with oral potassium. Pt. Comfortable, well appearing after medications. Started on Chloradiazepoxide for AUD treatment. Instructed to follow-up PCP for EtOH management.    Sara [name] was evaluated for the possibility of Alcohol  Use Disorder. Based on DSM-5 Criteria, she has demonstrated the following Use Disorder characteristics: Tolerance, Withdrawal,  and Physical health problems from substance use.   I discussed concerns for harm resulting from continued alcohol  use, and we discussed the following treatment options: Prescription(s) given: Chlordiazepoxide . Follow up after Emergency Department discharge: Patient's Primary Care Provider.   Assessment & Plan: 1. GENERALIZED ABDOMINAL PAIN   2. MILD ALCOHOL  USE DISORDER    New Prescriptions   CHLORDIAZEPOXIDE  25 MG ORAL CAP    Take 2 capsules by mouth every 6 hours for 4 doses, then 1 capsule by mouth every 6 hours for 8 doses. Do not mix with alcohol . May reduce dose if too sedating   DICYCLOMINE (BENTYL) 20 MG ORAL TAB    Take 1 tablet by mouth 4 times a day as needed for abdominal pain or cramps    Electronically signed by: GEORGEANNE KATHEE REVERING MD 07/03/2024 8:11 AM Electronically signed by Revering Georgeanne KATHEE (M.D.), M.D. at 07/03/2024  8:11 AM PDT
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
## (undated) NOTE — ED Notes (Signed)
 Formatting of this note might be different from the original. Apple sauce and water to the patient  Electronically signed by Lucien Saltness (R.N.), R.N. at 04/05/2023  9:37 PM PDT

## (undated) NOTE — ED Notes (Signed)
 Formatting of this note might be different from the original. Ambulance arrived for pt transportation services to assigned facility. Pt aox4, responsive and ambulatory. Verbalizes no needs at this time. Equal chest rise and fall. Shows no signs of distress. EMS received report and transfer papers. Pt states belongings are present and are with them. Plan of care ongoing.  Electronically signed by Melene Maryl Clarine Mardeen (R.N.), R.N. at 01/26/2024  2:31 AM PST
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
## (undated) NOTE — Progress Notes (Signed)
 Formatting of this note might be different from the original. SUMMARY OF PATIENT PROGRESS:  STATUS: Improving  Primary Problem: covid-19, PNA, etoh withdrawal  A/O, ambulatory. On RA 92%, on 3L  >96%. CIWA. Possible D/C tomorrow. Continue plan of care.  1550: O2 readings have been >94% on RA. 1606: paged MD (Team 11) to update about pts current status on RA. Family is wondering about whether pt is being discharged today or not. 1609: Dr. Theresa returned page and stated that we want to monitor patient at night to see if her saturations remain above 90% because she desaturated to the 80s at night previously. Updated family, Griselda (sister), and patient on this.   Problem: Adult Inpatient Plan of Care Goal: Plan of Care Review Outcome: Progressing Goal: Patient-Specific Goal (Individualized) Outcome: Progressing Goal: Absence of Hospital-Acquired Illness or Injury Outcome: Progressing Goal: Optimal Comfort and Wellbeing Outcome: Progressing  Problem: Alcohol  Withdrawal Goal: Alcohol  Withdrawal Symptom Control Outcome: Progressing Goal: Optimal Neurologic Function Outcome: Progressing Goal: Readiness for Change Identified Outcome: Progressing  Problem: Sepsis/Septic Shock Goal: Optimal Coping Outcome: Progressing Goal: Absence of Bleeding Outcome: Progressing Goal: Blood Glucose Level Within Targeted Range Outcome: Progressing Goal: Absence of Infection Signs and Symptoms Outcome: Progressing  Problem: Infection Goal: Absence of Infection Signs and Symptoms Outcome: Progressing  Problem: Fall Injury Risk Goal: Absence of Fall and Fall-Related Injury Outcome: Progressing  Problem: Confusion Acute Goal: Optimal Cognitive Function Outcome: Progressing  Problem: Pneumonia Goal: Fluid Balance Outcome: Progressing Goal: Resolution of Infection Signs and Symptoms Outcome: Progressing Goal: Effective Oxygenation and Ventilation Outcome: Progressing  Problem: HD  Fall Risk Goal: Patient will remain as independent as possible. Patient will have lower fall risk, lower injury risk, and remain safe from falls and injury Outcome: Progressing  Electronically signed by Lima, Kristalyn C (R.N.), R.N. at 01/14/2023  6:29 PM PST
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
## (undated) NOTE — Progress Notes (Signed)
 Formatting of this note is different from the original. CASE MANAGEMENT PROGRESS NOTE   Team: 11  Chart Reviewed/Case discussed with rounding MD.   01/15/2023- CM called [employer] at [phone], left message for Case Manager Sarah at [phone] ext [phone]) to follow up on O2.Office closed for the weekend .CM left voicemail with call back information.  Updated clinicals faxed to [phone]  CM met with patient at bedside. Patient states she can discharge to her mother-in-law's house.   CM informed patient her Health plan is closed for today for follow up on O2 authorization.   Patient states she does not have the funds to pay for portable oxygen and concentrator.   Discharge Disposition- Family's home upon receiving DME.  DISCHARGE PLANNING    Discharge Plan Status:  Transition/Discharge Plan of Care Acute care required?: Yes (++) Patient requires continued acute care due to:: ETOH intoxication, [facility] Referrals/Interventions: Other, Specify;Social Medicine (addiction medicine) Transition or Discharge Plan Status: Anticipated Discharge Disposition Plan: Homeless Shelter (++) Discharge Referrals [facility]): Social Work, specify Discharge Transportation: Other, specify [place]) Potential Barriers to Discharge Planning: None Identified at this Time Patient Appealed?: No Discharge Needs that Require Follow-Up: homeless resources   Explained transportation is not usually a covered benefit, unless determined medically necessary by a Physician or may be covered under your benefit package.  LONG VIEW OF CARE PLAN   Nutrition Resources:Nutrition: Yes (++)  Resource Provided:  ,  , Discharge Nutrition Resources: [employer] for [employer];Programme Researcher, Broadcasting/film/video,   Details:     Exercise/Socialization: Exercise/Socialization: No Needs Identified Resource Provided:  ,  ,   Details:     Caregiver Resources: CareGiver: No Needs Identified  Resource Provided:  ,  ,   Details:     Transportation Resources: Transportation: No Needs Identified  Resource Provided:  ,  ,   Details:    AD/POLST: Advance Directive: No Needs Identified / POLST: No Needs Identified Resource Provided:   /   Details:   /    RESOURCES (other resources):   Resource Provided:  ,  ,   Details:     Medicare IM letter issued on:   Discharge needs that require follow up: Discharge Needs that Require Follow-Up: homeless resources  CONCLUSION    Discharge plan developed with: Discharge plan developed with: Patient. Patient's post-discharge goals of care and treatment preferences considered in discharge plan.  Patient/family/caregiver in agreement and aware of plans.  Above plans communicated with: DC plan communicated with: MD (++),       Name of MD: Dr. Brett  .         Case manager to remain available for any emerging discharge planning needs/issues. Please call case manager for any questions/changes in patient's status.   -------------------------------------------------------------------------------------------------------  In HOSPITAL CHART ONLY: Click here to launch flowsheet to view complete [facility] CM Assessment/Discharge Plan.  This link is not intended to be used in Chart Review.   Electronically signed by: RAGUEL PARKINSON RN Case Manager 01/15/2023 11:47 AM  Electronically signed by Parkinson Raguel (R.N.), R.N. at 01/15/2023  4:28 PM PST
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
## (undated) NOTE — ED Notes (Signed)
 Formatting of this note might be different from the original. Pt bib ems for shortness of breath; pt off o2 desaturated to 87%; now on 3 L O2 and at 97%; no apparent distress at this time. Electronically signed by Costella Quintin CROME (R.N.), R.N. at 04/05/2023  8:57 PM PDT
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
## (undated) NOTE — Progress Notes (Signed)
 Formatting of this note might be different from the original. Received call from [facility] inquiring about discharge date and reason for delayed dc. Informed her that patient needed oxygen, was able to pay copay for oxygen and discharged to mother in laws home. Updated Clinicals faxed to [phone] with confirmation. Electronically signed by Delaine Foreman (R.N.), R.N. at 01/18/2023  3:56 PM PST

## (undated) NOTE — Progress Notes (Signed)
 Formatting of this note is different from the original. CASE MANAGEMENT INITIAL ASSESSMENT / DISCHARGE PLANNING NOTE   TEAM : ICU  Chart reviewed/Case discussed with rounding MD.  Home Medical Center: Member Residence Area: Other, Specify (MFA) Admission Source: Admission Source: Homeless Diagnosis: etoh intox, resp failure ?pna Readmission:  ;   Readmission Score (LACE)     Readmission Score (LACE) Total:  4    L-LENGTH OF STAY SCORE:  1   A-EMERGENT ADMISSION SCORE:  3   C-CHARLSON COMORBIDITY SCORE:  0   E-NUMBER OF ED VISITS SCORE:  0    PATIENT INFORMATION   Sara [name] is a 76 year old female  Language: Preferred Spoken Language: [place] Manager Used: Interpreter Services Used: NO - Patient preferred to use own English skills,    Legal information: Legal Information: None  Address: [address]  [place] [address]  Phone:    Home Phone     [phone]  Work Phone     Data Not Entered  Information Source/ Informant (relationship, name, phone): Information provided by: Patient  BENEFITS/FINANCIAL INFO    Primary Insurance Coverage(s):Payor: MEDICAL FINANCIAL ASSISTANCE - 100 / Plan: [id number]-100% HOSPITAL AND PROFESSIONAL / Product Type: *No Product type* /   Secondary Insurance Coverage(s): None  Is the Patient a Veteran? Is the Patient a Veteran?: No,  ,     Insurance Issues: Insurance Issues: Yes, specify, Specific Insurance Issue: MFA Tefl Teacher) expiration, specify,  ,    MENTAL STATUS/ORIENTATION    Prior: Mental Status Level: Other, specify (intoxicated),    Current: Mental Status Level: Alert and Oriented, Alert and Oriented: Person;Time;Place;Event  FUNCTIONAL STATUS  Prior:   Prior Level of Functioning (ADLs): Prior ADLS Functioning: Independent  Prior ADLs Needs Assistance with:  ,    Prior DME:  Pre-admission DME: None  Prior IADLS Functioning: Prior IADLS Functioning: Independent  Prior  IADLs Needs Assistance with:    Prior Financial Concerns Identified: Financial Concerns Identified: Unable to pay rent/mortgage;Unable to pay for transportation;Unable to pay utilities;Unable to pay telephone bills;Unable to pay for medication;Unable to pay for food Current:  ? Current Level of Functioning (ADLs):  Current ADLS Functioning: Assistance ? Current ADLs Needs Assistance with: ADLS - Need Assistance With: Transferring;Dressing;Bathing,    ? Current IADLS Functioning: Current IADLS Functioning: Assistance ? Current IADLs Needs Assistance with: IADLS - Needs Assistance With: Medication Management  Current Financial Concerns Identified: Financial Concerns Identified: Unable to pay for medication;Unable to pay for food;Unable to pay rent/mortgage;Unable to pay telephone bills;Unable to pay for transportation;Unable to pay utilities  PSYCHOSOCIAL    Living Arrangements: Living Arrangements: homeless  Home Accessibility:    Lives with: Lives With: alone  PreAdmission NECTAR   Pre-admit Benefited Services:    Nutrition Resources  Prior to admission:  Nutrition Resources Prior to Admission: None  Meal assistance: Nutrition Resources Prior to Admission: None   Exercise and Socialization Resources  Prior to admission:  Exercise and Socialization Resources Prior to Admission: None   Caregiver Resources   Patient currently has:  Tefl Teacher Prior to Admission: None   Transportation Resources  Patient utilizes the following for transportationHealth Visitor Prior to Admission: None   AD/POLST  Advanced Directive Prior to admission: Advance Directives: NO  POLST: POLST: No      DISCHARGE PLANNING   DISCHARGE PLAN:      Discharge Plan Status:  ? Spoke to patient at bedside. Address on facesheet is that of  her mother?s in [place] . Pt flew out to [place]  2 days ago. Pt is homeless. ? At baseline patient independent and doesn?t use DME. ?  Offered financial counselor referral to see Patient can qualify for Medi-cal to get extra [facility] support at home. Daughter declined. ? Dispo: Pt expressed interest in shelter resources, SW consulted. Transition/Discharge Plan of Care Acute care required?: Yes (++) Patient requires continued acute care due to:: ETOH intoxication, [facility] Referrals/Interventions: Other, Specify;Social Medicine (addiction medicine) Transition or Discharge Plan Status: Anticipated Discharge Disposition Plan: Homeless Shelter (++) Discharge Referrals [facility]): Social Work, specify Discharge Transportation: Other, specify [place]) Potential Barriers to Discharge Planning: None Identified at this Time Patient Appealed?: No Discharge Needs that Require Follow-Up: homeless resources   Explained transportation is not usually a covered benefit, unless determined medically necessary by a Physician or may be covered under your benefit package.  LONG VIEW OF CARE PLAN   Nutrition Resources:Nutrition: Yes (++)  Resource Provided:  ,  , Discharge Nutrition Resources: [employer] for [employer];Food Pantry    Exercise/Socialization: Exercise/Socialization: No Needs Identified  Caregiver Resources: CareGiver: No Needs Identified   Transportation Resources: Transportation: No Needs Identified   AD/POLST: Advance Directive: No Needs Identified / POLST: No Needs Identified   CONCLUSION    Discharge plan developed with:Discharge plan developed with: Patient. Patient's post-discharge goals of care and treatment preferences considered in discharge plan.  Patient/family/caregiver in agreement and aware of plans.  Above plans communicated with DC plan communicated with: MD (++),       Name of MD: Dr. Brett  .     Case manager to remain available for any emerging discharge planning needs/issues. Please call case manager for any questions/changes in patient's status.    ---------------------------------------------------------------------------------------------------------------  In HOSPITAL CHART ONLY: Click here to launch flowsheet to view complete [facility] CM Assessment/Discharge Plan.  This link is not intended to be used in Chart Review.   Electronically signed by: DOMENICA CORONEL RN Case Manager 01/07/2023 3:14 PM Electronically signed by Delaine Foreman (R.N.), R.N. at 01/07/2023  3:19 PM PST

## (undated) NOTE — Progress Notes (Signed)
 Formatting of this note might be different from the original. Brief MOD cross cover note - rapid response  Notified by RN that patient was hypotensive.  Seen at bedside: vitals HR 93, BP 50's/30's (cuff was on top of clothing), Sara [name] lethargic but arousable I had RN call rapid response  Of note cuff was on multiple layers of clothing, clothing removed and rechecked BP, 80's/60's MAP 61 Sara [name] arousable to noxious stimuli, would awaken and become frustrated then fall back asleep  Sara [name] on 3L [place], PERRL, abdomen soft non tender to palpation, no ascites appreciated, obese. Equal chest rise. with RN chaperone, no blood noted on chucks.    Assessment:  Refractory hypotension could be in setting of cirrhosis and/or sepsis, less likely GI bleed - midodrine  was on old med list in care everywhere but on my initial ED assessment Sara [name] was not able to confirm this  Plan: - ABG, ammonia level, lactate, repeat CBC to check Hg  - already on antibiotics  - IV albumin  ordered, Sara [name] had 2 crysterolid bolus in ED, a 3rd infusing at time of rapid response - reminded RN to obtain urine for drug screen - transfer to ICU, TCC intensivist notified   Addendum Called lab, cbc not collected Asked to have electrolytes and cr added on  Selinda Pu, DO Hospitalist [facility]  Electronically signed by Pu Selinda Sharper (D.O.), D.O. at 01/06/2023  7:53 PM PST Electronically signed by Pu Selinda Sharper (D.O.), D.O. at 01/06/2023  7:54 PM PST Electronically signed by Pu Selinda Sharper (D.O.), D.O. at 01/06/2023  8:00 PM PST

---

## 1998-08-22 ENCOUNTER — Emergency Department (HOSPITAL_COMMUNITY): Admission: EM | Admit: 1998-08-22 | Discharge: 1998-08-22 | Payer: Self-pay | Admitting: Emergency Medicine

## 1998-08-24 ENCOUNTER — Ambulatory Visit (HOSPITAL_COMMUNITY): Admission: RE | Admit: 1998-08-24 | Discharge: 1998-08-24 | Payer: Self-pay | Admitting: Emergency Medicine

## 1998-08-24 ENCOUNTER — Encounter: Payer: Self-pay | Admitting: Emergency Medicine

## 2008-03-15 ENCOUNTER — Emergency Department (HOSPITAL_COMMUNITY): Admission: EM | Admit: 2008-03-15 | Discharge: 2008-03-15 | Payer: Self-pay | Admitting: Emergency Medicine

## 2008-07-07 ENCOUNTER — Encounter: Admission: RE | Admit: 2008-07-07 | Discharge: 2008-07-07 | Payer: Self-pay | Admitting: Obstetrics

## 2009-01-14 ENCOUNTER — Emergency Department (HOSPITAL_COMMUNITY): Admission: EM | Admit: 2009-01-14 | Discharge: 2009-01-15 | Payer: Self-pay | Admitting: Emergency Medicine

## 2009-03-04 ENCOUNTER — Emergency Department (HOSPITAL_COMMUNITY): Admission: EM | Admit: 2009-03-04 | Discharge: 2009-03-05 | Payer: Self-pay | Admitting: Emergency Medicine

## 2009-03-05 ENCOUNTER — Ambulatory Visit: Payer: Self-pay | Admitting: *Deleted

## 2009-03-05 ENCOUNTER — Inpatient Hospital Stay (HOSPITAL_COMMUNITY): Admission: RE | Admit: 2009-03-05 | Discharge: 2009-03-08 | Payer: Self-pay | Admitting: *Deleted

## 2010-05-11 ENCOUNTER — Emergency Department (HOSPITAL_COMMUNITY): Admission: EM | Admit: 2010-05-11 | Discharge: 2010-05-11 | Payer: Self-pay | Admitting: Emergency Medicine

## 2010-06-13 ENCOUNTER — Ambulatory Visit: Payer: Self-pay | Admitting: Diagnostic Radiology

## 2010-06-13 ENCOUNTER — Emergency Department (HOSPITAL_BASED_OUTPATIENT_CLINIC_OR_DEPARTMENT_OTHER): Admission: EM | Admit: 2010-06-13 | Discharge: 2010-06-13 | Payer: Self-pay | Admitting: Emergency Medicine

## 2010-12-07 ENCOUNTER — Encounter (INDEPENDENT_AMBULATORY_CARE_PROVIDER_SITE_OTHER): Payer: Self-pay | Admitting: *Deleted

## 2010-12-07 ENCOUNTER — Emergency Department (HOSPITAL_COMMUNITY)
Admission: EM | Admit: 2010-12-07 | Discharge: 2010-12-07 | Payer: Self-pay | Source: Home / Self Care | Admitting: Emergency Medicine

## 2010-12-08 ENCOUNTER — Encounter (INDEPENDENT_AMBULATORY_CARE_PROVIDER_SITE_OTHER): Payer: Self-pay | Admitting: *Deleted

## 2010-12-08 LAB — URINALYSIS, ROUTINE W REFLEX MICROSCOPIC
Bilirubin Urine: NEGATIVE
Hgb urine dipstick: NEGATIVE
Ketones, ur: 15 mg/dL — AB
Nitrite: NEGATIVE
Protein, ur: NEGATIVE mg/dL
Specific Gravity, Urine: 1.018 (ref 1.005–1.030)
Urine Glucose, Fasting: NEGATIVE mg/dL
Urobilinogen, UA: 0.2 mg/dL (ref 0.0–1.0)
pH: 5 (ref 5.0–8.0)

## 2010-12-08 LAB — CBC
HCT: 39.4 % (ref 36.0–46.0)
Hemoglobin: 14.2 g/dL (ref 12.0–15.0)
MCH: 34.3 pg — ABNORMAL HIGH (ref 26.0–34.0)
MCHC: 36 g/dL (ref 30.0–36.0)
MCV: 95.2 fL (ref 78.0–100.0)
Platelets: 219 10*3/uL (ref 150–400)
RBC: 4.14 MIL/uL (ref 3.87–5.11)
RDW: 12 % (ref 11.5–15.5)
WBC: 6 10*3/uL (ref 4.0–10.5)

## 2010-12-08 LAB — OCCULT BLOOD, POC DEVICE: Fecal Occult Bld: NEGATIVE

## 2010-12-08 LAB — DIFFERENTIAL
Basophils Absolute: 0 10*3/uL (ref 0.0–0.1)
Basophils Relative: 0 % (ref 0–1)
Eosinophils Absolute: 0 10*3/uL (ref 0.0–0.7)
Eosinophils Relative: 1 % (ref 0–5)
Lymphocytes Relative: 43 % (ref 12–46)
Lymphs Abs: 2.6 10*3/uL (ref 0.7–4.0)
Monocytes Absolute: 0.4 10*3/uL (ref 0.1–1.0)
Monocytes Relative: 7 % (ref 3–12)
Neutro Abs: 3 10*3/uL (ref 1.7–7.7)
Neutrophils Relative %: 49 % (ref 43–77)

## 2010-12-08 LAB — COMPREHENSIVE METABOLIC PANEL
ALT: 200 U/L — ABNORMAL HIGH (ref 0–35)
AST: 164 U/L — ABNORMAL HIGH (ref 0–37)
Albumin: 4.2 g/dL (ref 3.5–5.2)
Alkaline Phosphatase: 69 U/L (ref 39–117)
BUN: 9 mg/dL (ref 6–23)
CO2: 25 mEq/L (ref 19–32)
Calcium: 9.7 mg/dL (ref 8.4–10.5)
Chloride: 102 mEq/L (ref 96–112)
Creatinine, Ser: 0.68 mg/dL (ref 0.4–1.2)
GFR calc Af Amer: >60 mL/min (ref >60)
GFR calc non Af Amer: >60 mL/min (ref >60)
Glucose, Bld: 107 mg/dL — ABNORMAL HIGH (ref 70–99)
Potassium: 4.2 mEq/L (ref 3.5–5.1)
Sodium: 139 mEq/L (ref 135–145)
Total Bilirubin: 0.5 mg/dL (ref 0.3–1.2)
Total Protein: 7.9 g/dL (ref 6.0–8.3)

## 2010-12-08 LAB — LIPASE, BLOOD: Lipase: 32 U/L (ref 11–59)

## 2010-12-08 LAB — POCT PREGNANCY, URINE: Preg Test, Ur: NEGATIVE

## 2010-12-23 NOTE — Letter (Signed)
Summary: New Patient letter  [facility]  [address] [place], [place] [address]   Phone: [phone]  Fax: [phone]       12/08/2010 MRN: [id number]  Sara [name] [address] [place], [place]  [address]  Dear Ms. Sara [name],  Welcome to the Gastroenterology Division at [facility].    You are scheduled to see Dr.  Russella Dar on 01-13-11 at 9:30a.m. on the 3rd floor at [facility], [address]  We ask that you try to arrive at our office 15 minutes prior to your appointment time to allow for check-in.  We would like you to complete the enclosed self-administered evaluation form prior to your visit and bring it with you on the day of your appointment.  We will review it with you.  Also, please bring a complete list of all your medications or, if you prefer, bring the medication bottles and we will list them.  Please bring your insurance card so that we may make a copy of it.  If your insurance requires a referral to see a specialist, please bring your referral form from your primary care physician.  Co-payments are due at the time of your visit and may be paid by cash, check or credit card.     Your office visit will consist of a consult with your physician (includes a physical exam), any laboratory testing he/she may order, scheduling of any necessary diagnostic testing (e.g. x-ray, ultrasound, CT-scan), and scheduling of a procedure (e.g. Endoscopy, Colonoscopy) if required.  Please allow enough time on your schedule to allow for any/all of these possibilities.    If you cannot keep your appointment, please call [phone] to cancel or reschedule prior to your appointment date.  This allows [place] the opportunity to schedule an appointment for another patient in need of care.  If you do not cancel or reschedule by 5 p.m. the business day prior to your appointment date, you will be charged a $50.00 late cancellation/no-show fee.    Thank you for choosing  [facility] for your medical needs.  We appreciate the opportunity to care for you.  Please visit [place] at our website  to learn more about our practice.                     Sincerely,                                                             The Gastroenterology Division

## 2011-01-07 ENCOUNTER — Ambulatory Visit (INDEPENDENT_AMBULATORY_CARE_PROVIDER_SITE_OTHER): Payer: BC Managed Care – PPO | Admitting: Gastroenterology

## 2011-01-07 ENCOUNTER — Other Ambulatory Visit: Payer: Self-pay | Admitting: Gastroenterology

## 2011-01-07 ENCOUNTER — Encounter: Payer: Self-pay | Admitting: Gastroenterology

## 2011-01-07 DIAGNOSIS — R945 Abnormal results of liver function studies: Secondary | ICD-10-CM

## 2011-01-07 DIAGNOSIS — F1010 Alcohol abuse, uncomplicated: Secondary | ICD-10-CM | POA: Insufficient documentation

## 2011-01-07 DIAGNOSIS — R112 Nausea with vomiting, unspecified: Secondary | ICD-10-CM | POA: Insufficient documentation

## 2011-01-07 DIAGNOSIS — R7989 Other specified abnormal findings of blood chemistry: Secondary | ICD-10-CM

## 2011-01-11 ENCOUNTER — Encounter: Payer: Self-pay | Admitting: Gastroenterology

## 2011-01-11 ENCOUNTER — Other Ambulatory Visit (AMBULATORY_SURGERY_CENTER): Payer: BC Managed Care – PPO | Admitting: Gastroenterology

## 2011-01-11 ENCOUNTER — Other Ambulatory Visit: Payer: Self-pay | Admitting: Gastroenterology

## 2011-01-11 DIAGNOSIS — K2990 Gastroduodenitis, unspecified, without bleeding: Secondary | ICD-10-CM

## 2011-01-11 DIAGNOSIS — K2970 Gastritis, unspecified, without bleeding: Secondary | ICD-10-CM

## 2011-01-11 DIAGNOSIS — R112 Nausea with vomiting, unspecified: Secondary | ICD-10-CM

## 2011-01-11 DIAGNOSIS — K2940 Chronic atrophic gastritis without bleeding: Secondary | ICD-10-CM

## 2011-01-12 ENCOUNTER — Encounter: Payer: Self-pay | Admitting: Gastroenterology

## 2011-01-12 ENCOUNTER — Ambulatory Visit (HOSPITAL_COMMUNITY)
Admission: RE | Admit: 2011-01-12 | Discharge: 2011-01-12 | Disposition: A | Discharge status: Home / Self Care | Payer: BC Managed Care – PPO | Source: Ambulatory Visit | Attending: Gastroenterology | Admitting: Gastroenterology

## 2011-01-12 DIAGNOSIS — R160 Hepatomegaly, not elsewhere classified: Secondary | ICD-10-CM | POA: Insufficient documentation

## 2011-01-12 DIAGNOSIS — R7401 Elevation of levels of liver transaminase levels: Secondary | ICD-10-CM | POA: Insufficient documentation

## 2011-01-12 DIAGNOSIS — R7402 Elevation of levels of lactic acid dehydrogenase (LDH): Secondary | ICD-10-CM | POA: Insufficient documentation

## 2011-01-12 DIAGNOSIS — R109 Unspecified abdominal pain: Secondary | ICD-10-CM | POA: Insufficient documentation

## 2011-01-12 DIAGNOSIS — R7989 Other specified abnormal findings of blood chemistry: Secondary | ICD-10-CM

## 2011-01-12 DIAGNOSIS — Z9089 Acquired absence of other organs: Secondary | ICD-10-CM | POA: Insufficient documentation

## 2011-01-12 NOTE — Assessment & Plan Note (Signed)
Summary: ELEVATED LFTS'.[facility]//MEDLIST//CX POLICY ADVISED/...   History of Present Illness Visit Type: Initial Consult Primary GI MD: Melvia Heaps MD [facility] Primary Provider: Dr Ellamae Sia, Requesting Provider: n/a Chief Complaint: Patient here for further evaluation of increased LFT's. She also c/o years intermittent LUQ abdominal discomfort. She has also started having vomiting episodes over the last 2 months. History of Present Illness:   Ms. Sara [name]  is a 35 year old Hispanic female referred from the emergency room for evaluation of abnormal liver tests. For the past 2 months she has been complaining of anorexia , nausea and frequent vomiting. This often occurs in the early morning but  also occusr throughout the day. She has not lost weight. She is complaining of upper epigastric pain as well.  Patient has a history of heavy alcohol use and over the past few months has been drinking up to a bottle of wine or a pint of Vodka daily. She has been jittery she does not use alcohol. She began Celexa in September, 2011. In January, 2012 AST was 120 and ALT 142. Other liver tests were normal.   Liipase was 150.   GI Review of Systems    Reports abdominal pain, acid reflux, belching, bloating, heartburn, loss of appetite, nausea, vomiting, and  vomiting blood.     Location of  Abdominal pain: LUQ.    Denies chest pain, dysphagia with liquids, dysphagia with solids, weight loss, and  weight gain.        Denies anal fissure, black tarry stools, change in bowel habit, constipation, diarrhea, diverticulosis, fecal incontinence, heme positive stool, hemorrhoids, irritable bowel syndrome, jaundice, light color stool, liver problems, rectal bleeding, and  rectal pain. Preventive Screening-Counseling & Management  Alcohol-Tobacco     Smoking Status: quit      Drug Use:  no.      Current Medications (verified): 1)  Klonopin 0.5 Mg Tabs (Clonazepam) .... Take 1 Tablet By Mouth Once  Daily 2)  Celexa 40 Mg Tabs (Citalopram Hydrobromide) .... Take 1 Tablet By Mouth Once A Day  Allergies (verified): No Known Drug Allergies  Past History:  Past Medical History: Alcoholism Anxiety Depression Hx Pneumonia  Past Surgical History: Cholecystectomy  Family History: No FH of Colon Cancer: Family History of Colon Polyps:Mother  Social History: Occupation: IT consultant Patient is a former smoker. -stopped 10 years ago Alcohol Use - yes-about 1 pint daily Illicit Drug Use - no Smoking Status:  quit Drug Use:  no  Review of Systems       The patient complains of anxiety-new, fatigue, sleeping problems, and thirst - excessive.  The patient denies allergy/sinus, anemia, arthritis/joint pain, back pain, blood in urine, breast changes/lumps, change in vision, confusion, cough, coughing up blood, depression-new, fainting, fever, headaches-new, hearing problems, heart murmur, heart rhythm changes, itching, menstrual pain, muscle pains/cramps, night sweats, nosebleeds, pregnancy symptoms, shortness of breath, skin rash, sore throat, swelling of feet/legs, swollen lymph glands, thirst - excessive , urination - excessive , urination changes/pain, urine leakage, vision changes, and voice change.         All other systems were reviewed and were negative   Vital Signs:  Patient profile:   35 year old female Height:      64 inches Weight:      176.25 pounds BMI:     30.36 BSA:     1.86 Pulse rate:   84 / minute Pulse rhythm:   regular BP sitting:   122 / 90  (left  arm)  Vitals Entered By: Lamona Curl CMA Duncan Dull) (January 07, 2011 9:19 AM)  Physical Exam  Additional Exam:   On physical exam she is a mildly obese female   Physical Exam: General:   WDWN HEENT:   anicteric.  No pharyngeal abnormalities Neck:   No masses, thyroidmegaly Nodes:   No cervical, axillary, inguinal adenopathy Chest:    Clear to auscultation Cardiac:   No murmurs, gallops,  rubs Abdomen:   BS active.  No abd masses,  organomegaly;  There is mild tenderness to palpation in the midepigastrium Rectal:   Deferred Extremities:   No cyanosis, clubbing, edema Skeletal:   No deformities Neuro:   Alert, oriented x3.  No focal abnormalities     Impression & Recommendations:  Problem # 1:  NAUSEA WITH VOMITING (ICD-787.01)   Anorexia, nausea and vomiting may be due to her alcoholism. Ulcer or nonulcer dyspepsia are possibilities as well as drug effect from Celexa.   She also may have low-grade pancreatitis as evidenced by her elevated lipase , due  to alcohol use  Recommendations #1 begin omeprazole 20 mg daily #2 abstain from alcohol #3 upper endoscopy  #4 to consider holding Celexa pending results of the above  Orders: EGD (EGD) Ultrasound Abdomen (UAS)  Problem # 2:  NONSPECIFIC ABNORMAL RESULTS LIVR FUNCTION STUDY (ICD-794.8)   Liver function abnormalities are probably due to hepatic steatosis..  Alcoholic hepatitis is also a possibility.   She does not have risk factors for viral hepatitis.  Recommendations #1 abdominal ultrasound #2 patient will attempt to abstain from alcohol  #3 repeat LFTs in one month the patient does abstain from alcohol  Orders: EGD (EGD) Ultrasound Abdomen (UAS)  Problem # 3:  ALCOHOL ABUSE (ICD-305.00)   We had a lengthy discussion about her alcohol abuse and medical ramifications. Patient is willing to try to abstain from alcohol. She is fearful of alcohol withdrawal symptoms. Ativan will be prescribed  Orders: EGD (EGD) Ultrasound Abdomen (UAS) #1 patient was instructed to attempt to  Patient Instructions: 1)  Copy sent to : Dr Ellamae Sia, 2)   begin Ativan within 12 hours of stopping alcohol.   Use one tablet every 6 hours thereafter  for 2-3 days. If you develop jitteriness or agitation you may increase Ativan to 2 tablets at a time. Call the office or seek medical assistance if any questions arise  during the time that you have stopped your alcohol consumption. 3)  The medication list was reviewed and reconciled.  All changed / newly prescribed medications were explained.  A complete medication list was provided to the patient / caregiver. Prescriptions: PRILOSEC 20 MG CPDR (OMEPRAZOLE) take one tab before breakfast daily  #30 x 2   Entered by:   Lamona Curl CMA (AAMA)   Authorized by:   Louis Meckel MD   Signed by:   Lamona Curl CMA (AAMA) on 01/07/2011   Method used:   Reprint   RxID:   [id number] ATIVAN 1 MG TABS (LORAZEPAM) take one tab every  hours as needed  wall abstaining from alcohol  #30 x 1   Entered by:   Lamona Curl CMA (AAMA)   Authorized by:   Louis Meckel MD   Signed by:   Lamona Curl CMA (AAMA) on 01/07/2011   Method used:   Reprint   RxID:   [id number] ATIVAN 1 MG TABS (LORAZEPAM) take one tab every  hours as needed  wall abstaining from alcohol  #30 x  1   Entered and Authorized by:   Louis Meckel MD   Signed by:   Louis Meckel MD on 01/07/2011   Method used:   Print then Give to Patient   RxID:   [id number] PRILOSEC 20 MG CPDR (OMEPRAZOLE) take one tab before breakfast daily  #30 x 2   Entered and Authorized by:   Louis Meckel MD   Signed by:   Louis Meckel MD on 01/07/2011   Method used:   Print then Give to Patient   RxID:   [id number]

## 2011-01-12 NOTE — Letter (Signed)
Summary: EGD Instructions  [facility]  [address] [place], [place] [address]   Phone: [phone]  Fax: [phone]       Sara [name]    17-Mar-1976    MRN: [id number]       Procedure Day /Date:TUESDAY 01/11/2011     Arrival Time: 2PM     Procedure Time:3PM     Location of Procedure:                    X  [facility] (4th Floor)    PREPARATION FOR ENDOSCOPY   On 01/11/2011  THE DAY OF THE PROCEDURE:  1.   No solid foods, milk or milk products are allowed after midnight the night before your procedure.  2.   Do not drink anything colored red or purple.  Avoid juices with pulp.  No orange juice.  3.  You may drink clear liquids until1PM , which is 2 hours before your procedure.                                                                                                CLEAR LIQUIDS INCLUDE: Water Jello Ice Popsicles Tea (sugar ok, no milk/cream) Powdered fruit flavored drinks Coffee (sugar ok, no milk/cream) Gatorade Juice: apple, white grape, white cranberry  Lemonade Clear bullion, consomm, broth Carbonated beverages (any kind) Strained chicken noodle soup Hard Candy   MEDICATION INSTRUCTIONS  Unless otherwise instructed, you should take regular prescription medications with a small sip of water as early as possible the morning of your procedure           OTHER INSTRUCTIONS  You will need a responsible adult at least 34 years of age to accompany you and drive you home.   This person must remain in the waiting room during your procedure.  Wear loose fitting clothing that is easily removed.  Leave jewelry and other valuables at home.  However, you may wish to bring a book to read or an iPod/MP3 player to listen to music as you wait for your procedure to start.  Remove all body piercing jewelry and leave at home.  Total time from sign-in until discharge is approximately 2-3 hours.  You should go home directly after  your procedure and rest.  You can resume normal activities the day after your procedure.  The day of your procedure you should not:   Drive   Make legal decisions   Operate machinery   Drink alcohol   Return to work  You will receive specific instructions about eating, activities and medications before you leave.    The above instructions have been reviewed and explained to me by   _______________________    I fully understand and can verbalize these instructions _____________________________ Date _________

## 2011-01-13 ENCOUNTER — Ambulatory Visit: Payer: Self-pay | Admitting: Gastroenterology

## 2011-01-18 ENCOUNTER — Encounter: Payer: Self-pay | Admitting: Gastroenterology

## 2011-01-18 NOTE — Procedures (Addendum)
Summary: Upper Endoscopy  Patient: Sara [name] Note: All result statuses are Final unless otherwise noted.  Tests: (1) Upper Endoscopy (EGD)   EGD Upper Endoscopy       DONE (C)     [facility]     [address]     [place], [place]  [address]          ENDOSCOPY PROCEDURE REPORT          PATIENT:  Sara [name], Sara [name]  MR#:  [id number]     BIRTHDATE:  1976/08/22, 34 yrs. old  GENDER:  female          ENDOSCOPIST:  Barbette Hair. Arlyce Dice, MD     Referred by:  Ellamae Sia, M.D.          PROCEDURE DATE:  01/11/2011     PROCEDURE:  EGD with biopsy, [id number]     ASA CLASS:  Class II     INDICATIONS:  nausea and vomiting          MEDICATIONS:   Fentanyl 75 mcg IV, Versed 8 mg IV, Benadryl 50 mg     IV, glycopyrrolate (Robinal) 0.2 mg IV, 0.6cc simethancone 0.6 cc     PO     TOPICAL ANESTHETIC:  Exactacain Spray          DESCRIPTION OF PROCEDURE:   After the risks benefits and     alternatives of the procedure were thoroughly explained, informed     consent was obtained.  The [facility] [id number] endoscope was     introduced through the mouth and advanced to the third portion of     the duodenum, without limitations.  The instrument was slowly     withdrawn as the mucosa was fully examined.     <<PROCEDUREIMAGES>>          Moderate gastritis was found in the cardia. Areas of submucosal     hemorrhagic mucosa. Bxs taken (see image6).  Otherwise the     examination was normal (see image3, image4, image5, image10, and     image11).    Retroflexed views revealed no abnormalities.    The     scope was then withdrawn from the patient and the procedure     completed.          COMPLICATIONS:  None          ENDOSCOPIC IMPRESSION:     1) Moderate gastritis in the cardia     2) Otherwise normal examination     RECOMMENDATIONS:     1) Prilosec 20 mg po q am; phenergan  prn nausea     2) Call office next 2-3 days to schedule an office appointment     for 3-4 weeks     3) MAC  sedation for future procedures          REPEAT EXAM:  No          ______________________________     Barbette Hair. Arlyce Dice, MD          CC:          n.     REVISED:  01/11/2011 03:27 PM     eSIGNED:   Barbette Hair. Brayleigh Rybacki at 01/11/2011 03:27 PM          Marcos Eke, [id number]  Note: An exclamation mark (!) indicates a result that was not dispersed into the flowsheet. Document Creation Date: 01/11/2011 3:28 PM _______________________________________________________________________  (1) Order result status: Final Collection or observation  date-time: 01/11/2011 15:17 Requested date-time:  Receipt date-time:  Reported date-time:  Referring Physician:   Ordering Physician: Melvia Heaps [phone]) Specimen Source:  Source: Launa Grill Order Number: [id number] Lab site:

## 2011-01-18 NOTE — Miscellaneous (Signed)
  Clinical Lists Changes  Medications: Added new medication of PROMETHAZINE HCL 25 MG TABS (PROMETHAZINE HCL) take 1 tab q6h as needed nausea - Signed Rx of PROMETHAZINE HCL 25 MG TABS (PROMETHAZINE HCL) take 1 tab q6h as needed nausea;  #25 x 1;  Signed;  Entered by: Louis Meckel MD;  Authorized by: Louis Meckel MD;  Method used: Electronically to [facility] # [address], [place], [place]  [address], Ph: [phone], Fax: [phone]    Prescriptions: PROMETHAZINE HCL 25 MG TABS (PROMETHAZINE HCL) take 1 tab q6h as needed nausea  #25 x 1   Entered and Authorized by:   Louis Meckel MD   Signed by:   Louis Meckel MD on 01/11/2011   Method used:   Electronically to        [facility] # [address] (retail)       [address]       [place], [place]  [address]       Ph: [phone]       Fax: [phone]   RxID:   [id number]

## 2011-01-18 NOTE — Letter (Signed)
Summary: Appt Reminder 2  [facility]  [address]   [place], [place] [address]   Phone: [phone]  Fax: [phone]        January 12, 2011 MRN: [id number]    [facility] [address] [place], [place]  [address]    Dear [name],   You have a return appointment with Dr. Arlyce Dice on 02/14/11 at 3pm.  Please remember to bring a complete list of the medicines you are taking, your insurance card and your co-pay.  If you have to cancel or reschedule this appointment, please call before 5:00 pm the evening before to avoid a cancellation fee.  If you have any questions or concerns, please call [phone].    Sincerely,    Selinda Michaels RN  Appended Document: Appt Reminder 2 Letter is mailed to the patient's home address

## 2011-01-27 NOTE — Letter (Signed)
Summary: Results Letter  [facility]  [address] [place], [place] [address]   Phone: [phone]  Fax: [phone]        January 18, 2011 MRN: [id number]    Sara [name] [address] [place], [place]  [address]    Dear [name],   Your biopsies demonstrated inflammatory changes only.    Please follow the recommendations previously discussed.  Should you have any immediate concerns or questions, feel free to contact me at the office.    Sincerely,  Barbette Hair. Arlyce Dice, M.D., [facility]          Sincerely,  Louis Meckel MD  This letter has been electronically signed by your physician.  Appended Document: Results Letter letter mailed

## 2011-01-31 ENCOUNTER — Telehealth: Payer: Self-pay | Admitting: Gastroenterology

## 2011-02-05 LAB — CBC
HCT: 38.3 % (ref 36.0–46.0)
Hemoglobin: 13.3 g/dL (ref 12.0–15.0)
MCH: 33.7 pg (ref 26.0–34.0)
MCHC: 34.8 g/dL (ref 30.0–36.0)
MCV: 96.7 fL (ref 78.0–100.0)
Platelets: 247 10*3/uL (ref 150–400)
RBC: 3.96 MIL/uL (ref 3.87–5.11)
RDW: 12 % (ref 11.5–15.5)
WBC: 10.1 10*3/uL (ref 4.0–10.5)

## 2011-02-05 LAB — BASIC METABOLIC PANEL
BUN: 11 mg/dL (ref 6–23)
CO2: 21 mEq/L (ref 19–32)
Calcium: 10 mg/dL (ref 8.4–10.5)
Chloride: 107 mEq/L (ref 96–112)
Creatinine, Ser: 0.6 mg/dL (ref 0.4–1.2)
GFR calc Af Amer: >60 mL/min (ref >60)
GFR calc non Af Amer: >60 mL/min (ref >60)
Glucose, Bld: 114 mg/dL — ABNORMAL HIGH (ref 70–99)
Potassium: 3.9 mEq/L (ref 3.5–5.1)
Sodium: 144 mEq/L (ref 135–145)

## 2011-02-05 LAB — POCT CARDIAC MARKERS
CKMB, poc: <1 ng/mL — ABNORMAL LOW (ref 1.0–8.0)
Myoglobin, poc: 35.1 ng/mL (ref 12–200)
Troponin i, poc: <0.05 ng/mL (ref 0.00–0.09)

## 2011-02-05 LAB — GLUCOSE, CAPILLARY: Glucose-Capillary: 96 mg/dL (ref 70–99)

## 2011-02-05 LAB — D-DIMER, QUANTITATIVE: D-Dimer, Quant: <0.22 ug/mL-FEU (ref 0.00–0.48)

## 2011-02-06 LAB — DIFFERENTIAL
Basophils Absolute: 0.1 10*3/uL (ref 0.0–0.1)
Basophils Relative: 1 % (ref 0–1)
Lymphocytes Relative: 28 % (ref 12–46)
Monocytes Absolute: 0.4 10*3/uL (ref 0.1–1.0)
Monocytes Relative: 4 % (ref 3–12)
Neutro Abs: 6.1 10*3/uL (ref 1.7–7.7)
Neutrophils Relative %: 67 % (ref 43–77)

## 2011-02-06 LAB — HEPATIC FUNCTION PANEL
ALT: 34 U/L (ref 0–35)
AST: 29 U/L (ref 0–37)
Albumin: 4.3 g/dL (ref 3.5–5.2)

## 2011-02-06 LAB — BASIC METABOLIC PANEL
BUN: 12 mg/dL (ref 6–23)
CO2: 27 mEq/L (ref 19–32)
Calcium: 9.9 mg/dL (ref 8.4–10.5)
Chloride: 99 mEq/L (ref 96–112)
Creatinine, Ser: 0.73 mg/dL (ref 0.4–1.2)
GFR calc Af Amer: >60 mL/min (ref >60)
GFR calc non Af Amer: >60 mL/min (ref >60)
Glucose, Bld: 138 mg/dL — ABNORMAL HIGH (ref 70–99)
Potassium: 4.8 mEq/L (ref 3.5–5.1)
Sodium: 135 mEq/L (ref 135–145)

## 2011-02-06 LAB — CBC
Hemoglobin: 15.2 g/dL — ABNORMAL HIGH (ref 12.0–15.0)
MCHC: 35.1 g/dL (ref 30.0–36.0)
RBC: 4.42 MIL/uL (ref 3.87–5.11)
RDW: 12.6 % (ref 11.5–15.5)

## 2011-02-06 LAB — ETHANOL: Alcohol, Ethyl (B): <5 mg/dL (ref 0–10)

## 2011-02-09 ENCOUNTER — Other Ambulatory Visit: Payer: BC Managed Care – PPO

## 2011-02-14 ENCOUNTER — Ambulatory Visit: Payer: BC Managed Care – PPO | Admitting: Gastroenterology

## 2011-02-17 NOTE — Progress Notes (Signed)
Summary: Ultrasound results  Phone Note Call from Patient Call back at Home Phone [phone]   Call For: Dr Arlyce Dice Reason for Call: Lab or Test Results Initial call taken by: Leanor Kail [facility],  January 31, 2011 1:25 PM  Follow-up for Phone Call        Patient is calling asking about her ultrasound results. Informed patient that Dr. Arlyce Dice is out of the office and that I would check with him when he returns and get back with her regarding the results. Follow-up by: Selinda Michaels RN,  January 31, 2011 2:05 PM  Additional Follow-up for Phone Call Additional follow up Details #1::        Dr. Arlyce Dice patient would like to know her Ultrasound results, please advise. Additional Follow-up by: Selinda Michaels RN,  February 07, 2011 8:18 AM    Additional Follow-up for Phone Call Additional follow up Details #2::    Fatty liver which can be seen with Etoh use. Follow-up by: Louis Meckel MD,  February 07, 2011 9:41 AM  Additional Follow-up for Phone Call Additional follow up Details #3:: Details for Additional Follow-up Action Taken: Left message to call back Selinda Michaels RN  February 07, 2011 10:53 AM  Patient aware. Additional Follow-up by: Selinda Michaels RN,  February 07, 2011 12:06 PM

## 2011-03-02 LAB — CBC
HCT: 39.9 % (ref 36.0–46.0)
Hemoglobin: 14.3 g/dL (ref 12.0–15.0)
Hemoglobin: 14.6 g/dL (ref 12.0–15.0)
MCHC: 35.5 g/dL (ref 30.0–36.0)
MCV: 98.3 fL (ref 78.0–100.0)
Platelets: 203 10*3/uL (ref 150–400)
RBC: 4.1 MIL/uL (ref 3.87–5.11)
RBC: 4.1 MIL/uL (ref 3.87–5.11)
RDW: 12.3 % (ref 11.5–15.5)
WBC: 7.8 10*3/uL (ref 4.0–10.5)

## 2011-03-02 LAB — DIFFERENTIAL
Eosinophils Relative: 1 % (ref 0–5)
Lymphocytes Relative: 47 % — ABNORMAL HIGH (ref 12–46)
Lymphs Abs: 3.6 10*3/uL (ref 0.7–4.0)
Monocytes Relative: 5 % (ref 3–12)

## 2011-03-02 LAB — POCT I-STAT, CHEM 8
BUN: 10 mg/dL (ref 6–23)
Creatinine, Ser: 0.7 mg/dL (ref 0.4–1.2)
Glucose, Bld: 123 mg/dL — ABNORMAL HIGH (ref 70–99)
Sodium: 139 mEq/L (ref 135–145)
TCO2: 21 mmol/L (ref 0–100)

## 2011-03-02 LAB — HEPATIC FUNCTION PANEL
ALT: 337 U/L — ABNORMAL HIGH (ref 0–35)
Alkaline Phosphatase: 77 U/L (ref 39–117)
Bilirubin, Direct: 0.1 mg/dL (ref 0.0–0.3)
Indirect Bilirubin: 0.5 mg/dL (ref 0.3–0.9)
Total Bilirubin: 0.6 mg/dL (ref 0.3–1.2)

## 2011-03-02 LAB — ETHANOL: Alcohol, Ethyl (B): 164 mg/dL — ABNORMAL HIGH (ref 0–10)

## 2011-03-02 LAB — RAPID URINE DRUG SCREEN, HOSP PERFORMED
Barbiturates: NOT DETECTED
Benzodiazepines: NOT DETECTED

## 2011-03-08 LAB — CBC
HCT: 40.7 % (ref 36.0–46.0)
Hemoglobin: 14.5 g/dL (ref 12.0–15.0)
MCHC: 35.7 g/dL (ref 30.0–36.0)
MCV: 97.6 fL (ref 78.0–100.0)
Platelets: 223 10*3/uL (ref 150–400)
RBC: 4.17 MIL/uL (ref 3.87–5.11)
RDW: 11.9 % (ref 11.5–15.5)
WBC: 8.4 10*3/uL (ref 4.0–10.5)

## 2011-03-08 LAB — COMPREHENSIVE METABOLIC PANEL
ALT: 317 U/L — ABNORMAL HIGH (ref 0–35)
AST: 255 U/L — ABNORMAL HIGH (ref 0–37)
Albumin: 4.3 g/dL (ref 3.5–5.2)
Alkaline Phosphatase: 77 U/L (ref 39–117)
BUN: 10 mg/dL (ref 6–23)
CO2: 19 mEq/L (ref 19–32)
Calcium: 8.8 mg/dL (ref 8.4–10.5)
Chloride: 106 mEq/L (ref 96–112)
Creatinine, Ser: 0.64 mg/dL (ref 0.4–1.2)
GFR calc Af Amer: >60 mL/min (ref >60)
GFR calc non Af Amer: >60 mL/min (ref >60)
Glucose, Bld: 123 mg/dL — ABNORMAL HIGH (ref 70–99)
Potassium: 3.9 mEq/L (ref 3.5–5.1)
Sodium: 137 mEq/L (ref 135–145)
Total Bilirubin: 0.6 mg/dL (ref 0.3–1.2)
Total Protein: 7.8 g/dL (ref 6.0–8.3)

## 2011-03-08 LAB — DIFFERENTIAL
Eosinophils Relative: 1 % (ref 0–5)
Lymphocytes Relative: 54 % — ABNORMAL HIGH (ref 12–46)
Lymphs Abs: 4.5 10*3/uL — ABNORMAL HIGH (ref 0.7–4.0)
Monocytes Absolute: 0.4 10*3/uL (ref 0.1–1.0)

## 2011-03-08 LAB — RAPID URINE DRUG SCREEN, HOSP PERFORMED
Amphetamines: NOT DETECTED
Cocaine: NOT DETECTED
Opiates: NOT DETECTED
Tetrahydrocannabinol: NOT DETECTED

## 2011-03-08 LAB — ETHANOL: Alcohol, Ethyl (B): 282 mg/dL — ABNORMAL HIGH (ref 0–10)

## 2011-04-08 NOTE — Discharge Summary (Signed)
Sara [name], Sara [name] NO.:  [id number]   MEDICAL RECORD NO.:  [id number]          PATIENT TYPE:  IPS   LOCATION:  0303                          FACILITY:  [facility]   PHYSICIAN:  Jasmine Pang, M.D. DATE OF BIRTH:  [birth year]   DATE OF ADMISSION:  03/05/2009  DATE OF DISCHARGE:  03/08/2009                               DISCHARGE SUMMARY   IDENTIFICATION:  This is a 35 year old female who was admitted on a  voluntary basis on March 05, 2009.   HISTORY OF PRESENT ILLNESS:  The patient states she was drinking a lot  of alcohol, 1 to 2 bottles of wine, almost everyday, plus beer.  She had  been drinking daily for the past year.  She gets shaky and anxious when  she tries to quit.  She denies any suicidal thoughts.  For further  admission information, see psychiatric admission assessment.   PHYSICAL FINDING:  The patient's physical exam was done in the ED.  There were no acute physical or medical problems noted.   ADMISSION LABORATORIES:  Alcohol level was 164.   HOSPITAL COURSE:  Upon admission, the patient was started on Librium 25  mg p.o. q.4 h. p.r.n. withdrawal and thiamine 100 mg daily.  She was  also started on Librium detox protocol.  In individual sessions, the  patient presented as a casually dressed young woman with fair eye  contact.  There was psychomotor retardation, but speech was normal rate  and flow.  She was alert and oriented x4.  She was somewhat depressed  and anxious.  Affect consistent with mood.  There was no evidence of  psychosis or thought disorder.  She states she had been in therapy last  year, but stopped.  She has no psychiatrist now.  She has also been in  an outpatient rehab in 2003.  She says her husband is supportive.  She  has 2 children, ages 9 and 44 years old.  Mother and father are  supportive.  There is a positive family history of alcohol dependence.  As hospitalization progressed, mood improved somewhat though she still  felt block at times.  Her family visited on March 06, 2009, and this  had gone well.  She had a family session on March 08, 2009.  She  acknowledges she continues to struggle with alcohol abuse.  She states  she uses it to deal with her anxiety.  Husband acknowledges that there  was a lot of conflict in the relationship.  The patient asked husband  not to drink around her at all.  He agreed to this.  Counselor gave the  list of AL-Anon meetings to husband, who thought they would be very  helpful.  Husband does not feel the patient was a danger to herself or  anyone else.  They both felt she was ready for discharge.  She was  encouraged to participate in AA and follow up at the [facility].  The patient understands.  The patient was  recommended not to resume Klonopin and to seek SSRI treatment for  anxiety.  The  patient understands, but seems to have poor insight.  There was no suicidal or homicidal ideation.  No thoughts of self-  injurious behavior.  No psychosis.  No thought disorder.  It was felt  the patient was safe for discharge after the family session since this  went so well.   DISCHARGE DIAGNOSES:  Axis I:  Alcohol dependence, anxiety disorder, not  otherwise specified.  Axis II:  None.  Axis III:  No diagnosis.  Axis IV:  Moderate (problems with primary support group, burden of  chemical dependence and psychiatric illness).  Axis V:  Global assessment of functioning was 55 upon discharge.  GAF  was 49 upon admission.  GAF highest past year was 64.   DISCHARGE PLANS:  There was no specific activity level or dietary  restrictions.   POSTHOSPITAL CARE PLANS:  The patient will go to the [facility] for followup psychiatric treatment.   DISCHARGE MEDICATIONS:  The patient was continued on Librium detox  protocol so as to avoid any withdrawal seizures.  She was to take 1  capsule today at noon and 5:00 p.m., then tomorrow 1 at 8:00 a.m.  and  5:00 p.m., then the following day, 1 capsule at 2:00 p.m., then  discontinue and her detox protocol will be complete.      Jasmine Pang, M.D.  Electronically Signed     [facility]/MEDQ  D:  03/23/2009  T:  03/24/2009  Job:  [id number]

## 2011-08-16 LAB — DIFFERENTIAL
Basophils Absolute: 0
Basophils Relative: 1
Eosinophils Absolute: 0.1
Eosinophils Relative: 1
Lymphocytes Relative: 49 — ABNORMAL HIGH
Lymphs Abs: 4.1 — ABNORMAL HIGH
Monocytes Absolute: 0.3
Monocytes Relative: 4
Neutro Abs: 3.8
Neutrophils Relative %: 46

## 2011-08-16 LAB — URINALYSIS, ROUTINE W REFLEX MICROSCOPIC
Bilirubin Urine: NEGATIVE
Glucose, UA: NEGATIVE
Hgb urine dipstick: NEGATIVE
Nitrite: NEGATIVE
Protein, ur: NEGATIVE
Specific Gravity, Urine: 1.009
Urobilinogen, UA: 0.2
pH: 5

## 2011-08-16 LAB — ETHANOL: Alcohol, Ethyl (B): 196 — ABNORMAL HIGH

## 2011-08-16 LAB — COMPREHENSIVE METABOLIC PANEL
Albumin: 4.2
Alkaline Phosphatase: 67
BUN: 8
Calcium: 9.3
Creatinine, Ser: 0.65
Potassium: 3.6
Total Protein: 7.9

## 2011-08-16 LAB — RAPID URINE DRUG SCREEN, HOSP PERFORMED
Benzodiazepines: NOT DETECTED
Cocaine: NOT DETECTED

## 2011-08-16 LAB — CBC
HCT: 38.9
MCHC: 36.2 — ABNORMAL HIGH
Platelets: 270
RDW: 12.2

## 2011-11-10 ENCOUNTER — Emergency Department (HOSPITAL_COMMUNITY)
Admission: EM | Admit: 2011-11-10 | Discharge: 2011-11-11 | Discharge status: Against Medical Advice | Payer: BC Managed Care – PPO

## 2011-11-10 NOTE — ED Notes (Signed)
Pt left with family member. Advised [place] that she did not want to stay.

## 2011-12-01 ENCOUNTER — Ambulatory Visit (INDEPENDENT_AMBULATORY_CARE_PROVIDER_SITE_OTHER): Payer: BC Managed Care – PPO

## 2011-12-01 DIAGNOSIS — J019 Acute sinusitis, unspecified: Secondary | ICD-10-CM

## 2011-12-01 DIAGNOSIS — M542 Cervicalgia: Secondary | ICD-10-CM

## 2012-02-02 ENCOUNTER — Other Ambulatory Visit: Payer: Self-pay | Admitting: Family Medicine

## 2012-02-02 MED ORDER — CLONAZEPAM 0.5 MG PO TABS: ORAL_TABLET | ORAL | Status: DC

## 2012-06-10 ENCOUNTER — Other Ambulatory Visit: Payer: Self-pay | Admitting: Family Medicine

## 2012-06-10 MED ORDER — CLONAZEPAM 0.5 MG PO TABS: ORAL_TABLET | ORAL | Status: DC

## 2012-08-30 ENCOUNTER — Other Ambulatory Visit: Payer: Self-pay | Admitting: *Deleted

## 2012-08-30 MED ORDER — CLONAZEPAM 0.5 MG PO TABS: ORAL_TABLET | ORAL | Status: DC

## 2012-11-10 ENCOUNTER — Ambulatory Visit (INDEPENDENT_AMBULATORY_CARE_PROVIDER_SITE_OTHER): Payer: BC Managed Care – PPO | Admitting: Physician Assistant

## 2012-11-10 ENCOUNTER — Ambulatory Visit: Payer: BC Managed Care – PPO

## 2012-11-10 VITALS — BP 130/83 | HR 98 | Temp 98.4°F | Resp 18 | Ht 63.5 in | Wt 188.0 lb

## 2012-11-10 DIAGNOSIS — R400 Somnolence: Secondary | ICD-10-CM

## 2012-11-10 DIAGNOSIS — F411 Generalized anxiety disorder: Secondary | ICD-10-CM

## 2012-11-10 DIAGNOSIS — E669 Obesity, unspecified: Secondary | ICD-10-CM

## 2012-11-10 DIAGNOSIS — G4710 Hypersomnia, unspecified: Secondary | ICD-10-CM

## 2012-11-10 DIAGNOSIS — J329 Chronic sinusitis, unspecified: Secondary | ICD-10-CM

## 2012-11-10 MED ORDER — AMOXICILLIN 500 MG PO CAPS: ORAL_CAPSULE | ORAL | Status: DC

## 2012-11-10 MED ORDER — FLUTICASONE PROPIONATE 50 MCG/ACT NA SUSP: 2.0000 | Freq: Every day | NASAL | Status: DC

## 2012-11-10 MED ORDER — CLONAZEPAM 0.5 MG PO TABS: ORAL_TABLET | ORAL | Status: DC

## 2012-11-10 MED ORDER — ESCITALOPRAM OXALATE 10 MG PO TABS: 10.0000 mg | ORAL_TABLET | Freq: Every day | ORAL | Status: DC

## 2012-11-10 NOTE — Progress Notes (Signed)
  Subjective:    Patient ID: Sara [name], female    DOB: Jan 09, 1976, 36 y.o.   MRN: [id number]  HPI 36 yr old CF presents with 3 week history of sinus congestion and pain. Green mucus.  She is sleeping with a humidifier. She has not had fever.  She feels very congestion and hearing is muffled.  She is having anxiety and is stable with her clonazepam, but isn't sleeping a full nights sleep.  She would like to start lexapro.  She has taken it previously with success.  She is going through a separation from her husband.  She hasn't been taking the citalopram.  She does snore at night. She has never had sleep apnea testing. She experiences daytime somnolence.  Review of Systems  All other systems reviewed and are negative.       Objective:   Physical Exam  Nursing note and vitals reviewed. Constitutional: She is oriented to person, place, and time. She appears well-developed and well-nourished.  HENT:  Head: Normocephalic and atraumatic.  Right Ear: External ear normal.  Left Ear: External ear normal.  Mouth/Throat: Oropharynx is clear and moist. No oropharyngeal exudate (mallampati 3).       B TM full and congested, no erythema.  Neck: Normal range of motion. Neck supple.  Cardiovascular: Normal rate, regular rhythm and normal heart sounds.   Pulmonary/Chest: Effort normal and breath sounds normal.  Neurological: She is alert and oriented to person, place, and time.  Skin: Skin is warm and dry.  Psychiatric: She has a normal mood and affect. Her behavior is normal.      Assessment & Plan:  Sinusitis-add mucinex D, continue saline and humidifier. Likely sleep apnea-set up testing Anxiety/depression-restart lexapro.

## 2012-11-11 ENCOUNTER — Emergency Department (HOSPITAL_COMMUNITY): Payer: BC Managed Care – PPO

## 2012-11-11 ENCOUNTER — Emergency Department (HOSPITAL_COMMUNITY)
Admission: EM | Admit: 2012-11-11 | Discharge: 2012-11-11 | Disposition: A | Discharge status: Home / Self Care | Payer: BC Managed Care – PPO | Attending: Emergency Medicine | Admitting: Emergency Medicine

## 2012-11-11 ENCOUNTER — Encounter (HOSPITAL_COMMUNITY): Payer: Self-pay | Admitting: *Deleted

## 2012-11-11 DIAGNOSIS — Z87891 Personal history of nicotine dependence: Secondary | ICD-10-CM | POA: Insufficient documentation

## 2012-11-11 DIAGNOSIS — Y9289 Other specified places as the place of occurrence of the external cause: Secondary | ICD-10-CM | POA: Insufficient documentation

## 2012-11-11 DIAGNOSIS — Z23 Encounter for immunization: Secondary | ICD-10-CM | POA: Insufficient documentation

## 2012-11-11 DIAGNOSIS — Z8659 Personal history of other mental and behavioral disorders: Secondary | ICD-10-CM | POA: Insufficient documentation

## 2012-11-11 DIAGNOSIS — W010XXA Fall on same level from slipping, tripping and stumbling without subsequent striking against object, initial encounter: Secondary | ICD-10-CM | POA: Insufficient documentation

## 2012-11-11 DIAGNOSIS — S80212A Abrasion, left knee, initial encounter: Secondary | ICD-10-CM

## 2012-11-11 DIAGNOSIS — IMO0002 Reserved for concepts with insufficient information to code with codable children: Secondary | ICD-10-CM | POA: Insufficient documentation

## 2012-11-11 DIAGNOSIS — F329 Major depressive disorder, single episode, unspecified: Secondary | ICD-10-CM | POA: Insufficient documentation

## 2012-11-11 DIAGNOSIS — F1010 Alcohol abuse, uncomplicated: Secondary | ICD-10-CM | POA: Insufficient documentation

## 2012-11-11 DIAGNOSIS — F3289 Other specified depressive episodes: Secondary | ICD-10-CM | POA: Insufficient documentation

## 2012-11-11 DIAGNOSIS — Z8701 Personal history of pneumonia (recurrent): Secondary | ICD-10-CM | POA: Insufficient documentation

## 2012-11-11 DIAGNOSIS — F10929 Alcohol use, unspecified with intoxication, unspecified: Secondary | ICD-10-CM

## 2012-11-11 DIAGNOSIS — Y9389 Activity, other specified: Secondary | ICD-10-CM | POA: Insufficient documentation

## 2012-11-11 MED ORDER — TETANUS-DIPHTH-ACELL PERTUSSIS 5-2.5-18.5 LF-MCG/0.5 IM SUSP
0.5000 mL | Freq: Once | INTRAMUSCULAR | Status: AC
  Administered 2012-11-11: 0.5 mL via INTRAMUSCULAR
  Filled 2012-11-11: qty 0.5

## 2012-11-11 NOTE — ED Notes (Signed)
Writer cleaned pt knee with saline, dry gauze, bacitracin, and wrapped it up with kerlex.

## 2012-11-11 NOTE — ED Notes (Addendum)
Pt from home with c/o fall. Left knee abrasion and bilateral ankle swelling. Patient able to stand with assistance. Sts she was at a wedding and consuming ETOH when she tripped and fell. Bleeding controlled from abrasion to left knee. Pt able to move all extremities. Pulses palpable bilaterally.
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
## 2012-11-11 NOTE — ED Notes (Signed)
Pt is ambulatory.  

## 2012-12-01 ENCOUNTER — Ambulatory Visit (INDEPENDENT_AMBULATORY_CARE_PROVIDER_SITE_OTHER): Payer: BC Managed Care – PPO | Admitting: Emergency Medicine

## 2012-12-01 ENCOUNTER — Ambulatory Visit: Payer: BC Managed Care – PPO

## 2012-12-01 VITALS — BP 124/80 | HR 106 | Temp 97.9°F | Resp 18 | Ht 63.5 in | Wt 188.0 lb

## 2012-12-01 DIAGNOSIS — J018 Other acute sinusitis: Secondary | ICD-10-CM

## 2012-12-01 DIAGNOSIS — K2970 Gastritis, unspecified, without bleeding: Secondary | ICD-10-CM

## 2012-12-01 DIAGNOSIS — J209 Acute bronchitis, unspecified: Secondary | ICD-10-CM

## 2012-12-01 DIAGNOSIS — R05 Cough: Secondary | ICD-10-CM

## 2012-12-01 DIAGNOSIS — R059 Cough, unspecified: Secondary | ICD-10-CM

## 2012-12-01 MED ORDER — LANSOPRAZOLE 30 MG PO CPDR: 30.0000 mg | DELAYED_RELEASE_CAPSULE | Freq: Every day | ORAL | Status: DC

## 2012-12-01 MED ORDER — PSEUDOEPHEDRINE-GUAIFENESIN ER 60-600 MG PO TB12: 1.0000 | ORAL_TABLET | Freq: Two times a day (BID) | ORAL | Status: DC

## 2012-12-01 MED ORDER — LEVOFLOXACIN 500 MG PO TABS: 500.0000 mg | ORAL_TABLET | Freq: Every day | ORAL | Status: AC

## 2012-12-01 NOTE — Progress Notes (Signed)
  Subjective:    Patient ID: Sara [name], female    DOB: 01/08/1976, 37 y.o.   MRN: [id number]  HPI    Review of Systems     Objective:   Physical Exam        Assessment & Plan:    [facility] reading (PRIMARY) by  Dr. Dareen Piano.  Negative chest.

## 2012-12-01 NOTE — Progress Notes (Signed)
[facility] [address] [place] [address] [phone]  Date:  12/01/2012   Name:  Sara [name]   DOB:  [birth year]   MRN:  [id number]  PCP:  Tonye Pearson, MD    Chief Complaint: Cough, Sore Throat and Wheezing   History of Present Illness:  Sara [name] is a 37 y.o. very pleasant female patient who presents with the following:  Ill since before Christmas with cough that is largely non productive. Some clear nasal drainage and congestion.  No wheezing or shortness of breath.  No fever or chills. No nausea or vomiting. No improvement with OTC medications.  Says not experiencing symptoms suggestive of reflux and her gastritis symptoms are controlled largely with OTC zantac.  No hemoptysis or hematemesis.  Patient Active Problem List  Diagnosis  . ALCOHOL ABUSE  . NAUSEA WITH VOMITING  . NONSPECIFIC ABNORMAL RESULTS LIVR FUNCTION STUDY    Past Medical History  Diagnosis Date  . Alcoholism   . Anxiety   . Depression   . Pneumonia     Past Surgical History  Procedure Date  . Cholecystectomy     History  Substance Use Topics  . Smoking status: Former Games developer  . Smokeless tobacco: Not on file  . Alcohol Use: 0.0 oz/week    Family History  Problem Relation Age of Onset  . Colon polyps Mother   . Hypertension Mother   . Thyroid disease Mother     No Known Allergies  Medication list has been reviewed and updated.  Current Outpatient Prescriptions on File Prior to Visit  Medication Sig Dispense Refill  . escitalopram (LEXAPRO) 10 MG tablet Take 1 tablet (10 mg total) by mouth daily.  90 tablet  1  . etonogestrel-ethinyl estradiol (NUVARING) 0.12-0.015 MG/24HR vaginal ring Place 1 each vaginally every 28 (twenty-eight) days. Insert vaginally and leave in place for 3 consecutive weeks, then remove for 1 week.      . fluticasone (FLONASE) 50 MCG/ACT nasal spray Place 2 sprays into the nose daily.  16 g  6  . medroxyPROGESTERone  (DEPO-PROVERA) 150 MG/ML injection Inject 150 mg into the muscle every 3 (three) months.        Review of Systems:  As per HPI, otherwise negative.    Physical Examination: Filed Vitals:   12/01/12 1201  BP: 124/80  Pulse: 106  Temp: 97.9 F (36.6 C)  Resp: 18   Filed Vitals:   12/01/12 1201  Height: 5' 3.5" (1.613 m)  Weight: 188 lb (85.276 kg)   Body mass index is 32.78 kg/(m^2). Ideal Body Weight: Weight in (lb) to have BMI = 25: 143.1   GEN: WDWN, NAD, Non-toxic, A & O x 3 HEENT: Atraumatic, Normocephalic. Neck supple. No masses, No LAD. Ears and Nose: No external deformity.  Nasal congestion and inflammation CV: RRR, No M/G/R. No JVD. No thrill. No extra heart sounds. PULM: CTA B, no wheezes, crackles, rhonchi. No retractions. No resp. distress. No accessory muscle use. ABD: S, NT, ND, +BS. No rebound. No HSM. EXTR: No c/c/e NEURO Normal gait.  PSYCH: Normally interactive. Conversant. Not depressed or anxious appearing.  Calm demeanor.    Assessment and Plan: Sinusitis Bronchitis Gastritis levaquin mucinex d Prevacid Follow up as needed  Carmelina Dane, MD

## 2012-12-28 ENCOUNTER — Emergency Department (HOSPITAL_COMMUNITY)
Admission: EM | Admit: 2012-12-28 | Discharge: 2012-12-28 | Disposition: A | Discharge status: Home / Self Care | Payer: BC Managed Care – PPO | Attending: Emergency Medicine | Admitting: Emergency Medicine

## 2012-12-28 ENCOUNTER — Emergency Department (HOSPITAL_COMMUNITY): Payer: BC Managed Care – PPO

## 2012-12-28 ENCOUNTER — Encounter (HOSPITAL_COMMUNITY): Payer: Self-pay | Admitting: Emergency Medicine

## 2012-12-28 DIAGNOSIS — F411 Generalized anxiety disorder: Secondary | ICD-10-CM | POA: Insufficient documentation

## 2012-12-28 DIAGNOSIS — S6990XA Unspecified injury of unspecified wrist, hand and finger(s), initial encounter: Secondary | ICD-10-CM | POA: Insufficient documentation

## 2012-12-28 DIAGNOSIS — Z79899 Other long term (current) drug therapy: Secondary | ICD-10-CM | POA: Insufficient documentation

## 2012-12-28 DIAGNOSIS — S59919A Unspecified injury of unspecified forearm, initial encounter: Secondary | ICD-10-CM | POA: Insufficient documentation

## 2012-12-28 DIAGNOSIS — S298XXA Other specified injuries of thorax, initial encounter: Secondary | ICD-10-CM | POA: Insufficient documentation

## 2012-12-28 DIAGNOSIS — Z8701 Personal history of pneumonia (recurrent): Secondary | ICD-10-CM | POA: Insufficient documentation

## 2012-12-28 DIAGNOSIS — S4991XA Unspecified injury of right shoulder and upper arm, initial encounter: Secondary | ICD-10-CM

## 2012-12-28 DIAGNOSIS — F1020 Alcohol dependence, uncomplicated: Secondary | ICD-10-CM | POA: Insufficient documentation

## 2012-12-28 DIAGNOSIS — F329 Major depressive disorder, single episode, unspecified: Secondary | ICD-10-CM | POA: Insufficient documentation

## 2012-12-28 DIAGNOSIS — Z87891 Personal history of nicotine dependence: Secondary | ICD-10-CM | POA: Insufficient documentation

## 2012-12-28 DIAGNOSIS — F3289 Other specified depressive episodes: Secondary | ICD-10-CM | POA: Insufficient documentation

## 2012-12-28 DIAGNOSIS — S59909A Unspecified injury of unspecified elbow, initial encounter: Secondary | ICD-10-CM | POA: Insufficient documentation

## 2012-12-28 DIAGNOSIS — R0789 Other chest pain: Secondary | ICD-10-CM

## 2012-12-28 LAB — BASIC METABOLIC PANEL
BUN: 5 mg/dL — ABNORMAL LOW (ref 6–23)
Chloride: 101 mEq/L (ref 96–112)
Glucose, Bld: 118 mg/dL — ABNORMAL HIGH (ref 70–99)
Potassium: 3.3 mEq/L — ABNORMAL LOW (ref 3.5–5.1)

## 2012-12-28 LAB — CBC
HCT: 34.9 % — ABNORMAL LOW (ref 36.0–46.0)
Hemoglobin: 11.8 g/dL — ABNORMAL LOW (ref 12.0–15.0)
MCHC: 33.8 g/dL (ref 30.0–36.0)

## 2012-12-28 MED ORDER — OXYCODONE-ACETAMINOPHEN 5-325 MG PO TABS: 2.0000 | ORAL_TABLET | ORAL | Status: DC | PRN

## 2012-12-28 MED ORDER — HYDROCODONE-ACETAMINOPHEN 5-325 MG PO TABS
1.0000 | ORAL_TABLET | Freq: Once | ORAL | Status: AC
  Administered 2012-12-28: 1 via ORAL
  Filled 2012-12-28: qty 1

## 2012-12-28 NOTE — ED Notes (Signed)
Rx x 1.  Pt voiced understanding to f/u with PCP and return for worsening condition.  
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
## 2012-12-28 NOTE — ED Notes (Signed)
Patient transported to X-ray 
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
## 2013-01-07 ENCOUNTER — Telehealth: Payer: Self-pay | Admitting: *Deleted

## 2013-01-07 NOTE — Telephone Encounter (Signed)
[facility] requesting refill on clonazepam 0.5mg . Last fill on 11/10/12

## 2013-01-08 MED ORDER — CLONAZEPAM 0.5 MG PO TABS: ORAL_TABLET | ORAL | Status: DC

## 2013-01-08 NOTE — Telephone Encounter (Signed)
rx signed and ready to be faxed

## 2013-01-08 NOTE — Telephone Encounter (Signed)
Faxed,

## 2013-01-23 ENCOUNTER — Other Ambulatory Visit: Payer: Self-pay | Admitting: Otolaryngology

## 2013-02-16 ENCOUNTER — Encounter (HOSPITAL_COMMUNITY): Payer: Self-pay | Admitting: Emergency Medicine

## 2013-02-16 ENCOUNTER — Emergency Department (HOSPITAL_COMMUNITY): Payer: BC Managed Care – PPO

## 2013-02-16 ENCOUNTER — Emergency Department (HOSPITAL_COMMUNITY)
Admission: EM | Admit: 2013-02-16 | Discharge: 2013-02-16 | Disposition: A | Discharge status: Home / Self Care | Payer: BC Managed Care – PPO | Attending: Emergency Medicine | Admitting: Emergency Medicine

## 2013-02-16 DIAGNOSIS — Z3202 Encounter for pregnancy test, result negative: Secondary | ICD-10-CM | POA: Insufficient documentation

## 2013-02-16 DIAGNOSIS — K859 Acute pancreatitis without necrosis or infection, unspecified: Secondary | ICD-10-CM

## 2013-02-16 DIAGNOSIS — K7460 Unspecified cirrhosis of liver: Secondary | ICD-10-CM

## 2013-02-16 DIAGNOSIS — F329 Major depressive disorder, single episode, unspecified: Secondary | ICD-10-CM | POA: Insufficient documentation

## 2013-02-16 DIAGNOSIS — F1020 Alcohol dependence, uncomplicated: Secondary | ICD-10-CM

## 2013-02-16 DIAGNOSIS — F3289 Other specified depressive episodes: Secondary | ICD-10-CM | POA: Insufficient documentation

## 2013-02-16 DIAGNOSIS — Z79899 Other long term (current) drug therapy: Secondary | ICD-10-CM | POA: Insufficient documentation

## 2013-02-16 DIAGNOSIS — F411 Generalized anxiety disorder: Secondary | ICD-10-CM | POA: Insufficient documentation

## 2013-02-16 DIAGNOSIS — K7030 Alcoholic cirrhosis of liver without ascites: Secondary | ICD-10-CM | POA: Insufficient documentation

## 2013-02-16 DIAGNOSIS — Z87891 Personal history of nicotine dependence: Secondary | ICD-10-CM | POA: Insufficient documentation

## 2013-02-16 DIAGNOSIS — K219 Gastro-esophageal reflux disease without esophagitis: Secondary | ICD-10-CM | POA: Insufficient documentation

## 2013-02-16 DIAGNOSIS — Z8701 Personal history of pneumonia (recurrent): Secondary | ICD-10-CM | POA: Insufficient documentation

## 2013-02-16 DIAGNOSIS — R6883 Chills (without fever): Secondary | ICD-10-CM | POA: Insufficient documentation

## 2013-02-16 DIAGNOSIS — Z9089 Acquired absence of other organs: Secondary | ICD-10-CM | POA: Insufficient documentation

## 2013-02-16 HISTORY — DX: Gastro-esophageal reflux disease without esophagitis: K21.9

## 2013-02-16 LAB — URINALYSIS, ROUTINE W REFLEX MICROSCOPIC
Ketones, ur: NEGATIVE mg/dL
Protein, ur: 30 mg/dL — AB
Urobilinogen, UA: 1 mg/dL (ref 0.0–1.0)

## 2013-02-16 LAB — CBC WITH DIFFERENTIAL/PLATELET
Eosinophils Relative: 2 % (ref 0–5)
HCT: 31.1 % — ABNORMAL LOW (ref 36.0–46.0)
Hemoglobin: 10.3 g/dL — ABNORMAL LOW (ref 12.0–15.0)
Lymphocytes Relative: 34 % (ref 12–46)
Lymphs Abs: 2.3 10*3/uL (ref 0.7–4.0)
MCV: 96 fL (ref 78.0–100.0)
Monocytes Absolute: 0.4 10*3/uL (ref 0.1–1.0)
RBC: 3.24 MIL/uL — ABNORMAL LOW (ref 3.87–5.11)
WBC: 6.8 10*3/uL (ref 4.0–10.5)

## 2013-02-16 LAB — COMPREHENSIVE METABOLIC PANEL
ALT: 43 U/L — ABNORMAL HIGH (ref 0–35)
CO2: 22 mEq/L (ref 19–32)
Calcium: 8.4 mg/dL (ref 8.4–10.5)
Creatinine, Ser: 0.49 mg/dL — ABNORMAL LOW (ref 0.50–1.10)
GFR calc Af Amer: >90 mL/min (ref >90)
GFR calc non Af Amer: >90 mL/min (ref >90)
Glucose, Bld: 155 mg/dL — ABNORMAL HIGH (ref 70–99)
Total Bilirubin: 0.9 mg/dL (ref 0.3–1.2)

## 2013-02-16 LAB — URINE MICROSCOPIC-ADD ON

## 2013-02-16 MED ORDER — SODIUM CHLORIDE 0.9 % IV BOLUS (SEPSIS)
1000.0000 mL | Freq: Once | INTRAVENOUS | Status: AC
  Administered 2013-02-16: 1000 mL via INTRAVENOUS

## 2013-02-16 MED ORDER — OXYCODONE HCL 5 MG PO TABS
5.0000 mg | ORAL_TABLET | Freq: Once | ORAL | Status: AC
  Administered 2013-02-16: 5 mg via ORAL
  Filled 2013-02-16: qty 1

## 2013-02-16 MED ORDER — ONDANSETRON HCL 4 MG/2ML IJ SOLN
4.0000 mg | Freq: Once | INTRAMUSCULAR | Status: AC
  Administered 2013-02-16: 4 mg via INTRAVENOUS
  Filled 2013-02-16: qty 2

## 2013-02-16 MED ORDER — MORPHINE SULFATE 4 MG/ML IJ SOLN
4.0000 mg | Freq: Once | INTRAMUSCULAR | Status: AC
  Administered 2013-02-16: 4 mg via INTRAVENOUS
  Filled 2013-02-16: qty 1

## 2013-02-16 MED ORDER — IOHEXOL 300 MG/ML  SOLN
100.0000 mL | Freq: Once | INTRAMUSCULAR | Status: AC | PRN
  Administered 2013-02-16: 100 mL via INTRAVENOUS

## 2013-02-16 MED ORDER — OXYCODONE HCL 5 MG PO TABS: 5.0000 mg | ORAL_TABLET | ORAL | Status: DC | PRN

## 2013-02-16 MED ORDER — HYDROMORPHONE HCL PF 1 MG/ML IJ SOLN: 1.0000 mg | Freq: Once | INTRAMUSCULAR | Status: DC

## 2013-02-16 MED ORDER — IOHEXOL 300 MG/ML  SOLN
50.0000 mL | Freq: Once | INTRAMUSCULAR | Status: AC | PRN
  Administered 2013-02-16: 50 mL via ORAL

## 2013-02-16 MED ORDER — PANTOPRAZOLE SODIUM 40 MG IV SOLR
40.0000 mg | Freq: Once | INTRAVENOUS | Status: AC
  Administered 2013-02-16: 40 mg via INTRAVENOUS
  Filled 2013-02-16: qty 40

## 2013-02-16 MED ORDER — GI COCKTAIL ~~LOC~~
30.0000 mL | Freq: Once | ORAL | Status: AC
  Administered 2013-02-16: 30 mL via ORAL
  Filled 2013-02-16: qty 30

## 2013-02-16 NOTE — ED Provider Notes (Signed)
Medical screening examination/treatment/procedure(s) were performed by non-physician practitioner and as supervising physician I was immediately available for consultation/collaboration.  John-Adam Alby Schwabe, M.D.   John-Adam Diahann Guajardo, MD 02/16/13 0806 

## 2013-02-16 NOTE — ED Notes (Signed)
Report received, airway intact, no s/s's of distress-will continue to monitor 

## 2013-02-16 NOTE — ED Provider Notes (Signed)
History     CSN: [id number]  Arrival date & time 02/16/13  0214   First MD Initiated Contact with Patient 02/16/13 0222      Chief Complaint  Patient presents with  . Abdominal Pain   HPI  History provided by the patient and significant other. Patient is a 37 year old female with history of anxiety, depression, GERD and alcoholism who presents with complaints of persistent and worsening upper abdominal pain. Symptoms first began Thursday afternoon shortly after leaving her doctor's office. Patient states that she tried to how her pain has continued to have significant pain and discomfort. If symptoms have not been associated with any nausea, vomiting or diarrhea. She has continued to have a normal appetite his symptoms do not seem worse with eating or drinking. Pain is also unchanged with any movements. She denies any pleuritic or chest pain. No heart palpitations or shortness of breath. Denies any recent fever, chills or sweats. No diarrhea or constipation. No urinary complaints. No irregular menstrual bleeding. Patient is currently menstruating. No unusual vaginal discharge. She denies any other aggravating or alleviating factors. No other symptoms.     Past Medical History  Diagnosis Date  . Alcoholism   . Anxiety   . Depression   . Pneumonia   . GERD (gastroesophageal reflux disease)     Past Surgical History  Procedure Laterality Date  . Cholecystectomy      Family History  Problem Relation Age of Onset  . Colon polyps Mother   . Hypertension Mother   . Thyroid disease Mother     History  Substance Use Topics  . Smoking status: Former Games developer  . Smokeless tobacco: Not on file  . Alcohol Use: 0.0 oz/week    OB History   Grav Para Term Preterm Abortions TAB SAB Ect Mult Living                  Review of Systems  Constitutional: Positive for chills. Negative for fever and diaphoresis.  Respiratory: Negative for shortness of breath.   Gastrointestinal: Positive  for abdominal pain. Negative for nausea, vomiting, diarrhea and constipation.  All other systems reviewed and are negative.    Allergies  Review of patient's allergies indicates no known allergies.  Home Medications   Current Outpatient Rx  Name  Route  Sig  Dispense  Refill  . clonazePAM (KLONOPIN) 0.5 MG tablet      1 in am, 2 hs prn   90 tablet   0   . escitalopram (LEXAPRO) 10 MG tablet   Oral   Take 5 mg by mouth daily.         [name] etonogestrel-ethinyl estradiol (NUVARING) 0.12-0.015 MG/24HR vaginal ring   Vaginal   Place 1 each vaginally every 28 (twenty-eight) days. Insert vaginally and leave in place for 3 consecutive weeks, then remove for 1 week.         . lansoprazole (PREVACID) 30 MG capsule   Oral   Take 1 capsule (30 mg total) by mouth daily.   30 capsule   2   . oxyCODONE-acetaminophen (PERCOCET/ROXICET) 5-325 MG per tablet   Oral   Take 2 tablets by mouth every 4 (four) hours as needed for pain.   6 tablet   0   . pseudoephedrine-guaifenesin (MUCINEX D) 60-600 MG per tablet   Oral   Take 1 tablet by mouth 2 (two) times daily as needed. For chest congestion           BP  124/77  Pulse 100  Temp(Src) 99.4 F (37.4 C) (Oral)  Resp 18  SpO2 96%  Physical Exam  Nursing note and vitals reviewed. Constitutional: She is oriented to person, place, and time. She appears well-developed and well-nourished. No distress.  HENT:  Head: Normocephalic.  Cardiovascular: Normal rate and regular rhythm.   No murmur heard. Pulmonary/Chest: Effort normal and breath sounds normal. No respiratory distress.  Abdominal: Soft. She exhibits distension. There is hepatosplenomegaly. There is tenderness in the right upper quadrant, epigastric area and left upper quadrant. There is guarding. There is no rebound, no CVA tenderness and negative Murphy's sign.  Obese  Musculoskeletal: Normal range of motion. She exhibits no edema and no tenderness.  Neurological: She is  alert and oriented to person, place, and time.  Skin: Skin is warm and dry. No rash noted.  Psychiatric: She has a normal mood and affect. Her behavior is normal.    ED Course  Procedures   Results for orders placed during the hospital encounter of 02/16/13  CBC WITH DIFFERENTIAL      Result Value Range   WBC 6.8  4.0 - 10.5 K/uL   RBC 3.24 (*) 3.87 - 5.11 MIL/uL   Hemoglobin 10.3 (*) 12.0 - 15.0 g/dL   HCT 45.4 (*) 09.8 - 11.9 %   MCV 96.0  78.0 - 100.0 fL   MCH 31.8  26.0 - 34.0 pg   MCHC 33.1  30.0 - 36.0 g/dL   RDW 14.7  82.9 - 56.2 %   Platelets 131 (*) 150 - 400 K/uL   Neutrophils Relative 58  43 - 77 %   Neutro Abs 3.9  1.7 - 7.7 K/uL   Lymphocytes Relative 34  12 - 46 %   Lymphs Abs 2.3  0.7 - 4.0 K/uL   Monocytes Relative 6  3 - 12 %   Monocytes Absolute 0.4  0.1 - 1.0 K/uL   Eosinophils Relative 2  0 - 5 %   Eosinophils Absolute 0.1  0.0 - 0.7 K/uL   Basophils Relative 0  0 - 1 %   Basophils Absolute 0.0  0.0 - 0.1 K/uL  COMPREHENSIVE METABOLIC PANEL      Result Value Range   Sodium 134 (*) 135 - 145 mEq/L   Potassium 3.5  3.5 - 5.1 mEq/L   Chloride 100  96 - 112 mEq/L   CO2 22  19 - 32 mEq/L   Glucose, Bld 155 (*) 70 - 99 mg/dL   BUN 5 (*) 6 - 23 mg/dL   Creatinine, Ser 1.30 (*) 0.50 - 1.10 mg/dL   Calcium 8.4  8.4 - 86.5 mg/dL   Total Protein 8.1  6.0 - 8.3 g/dL   Albumin 2.6 (*) 3.5 - 5.2 g/dL   AST 784 (*) 0 - 37 U/L   ALT 43 (*) 0 - 35 U/L   Alkaline Phosphatase 125 (*) 39 - 117 U/L   Total Bilirubin 0.9  0.3 - 1.2 mg/dL   GFR calc non Af Amer >90  >90 mL/min   GFR calc Af Amer >90  >90 mL/min  LIPASE, BLOOD      Result Value Range   Lipase 173 (*) 11 - 59 U/L       Ct Abdomen Pelvis W Contrast  02/16/2013  *RADIOLOGY REPORT*  Clinical Data: Abdominal pain.  Evaluate for pancreatitis.  CT ABDOMEN AND PELVIS WITH CONTRAST  Technique:  Multidetector CT imaging of the abdomen and pelvis was performed following the  standard protocol during bolus  administration of intravenous contrast.  Contrast: 50mL OMNIPAQUE IOHEXOL 300 MG/ML  SOLN, OMNIPAQUE IOHEXOL 300 MG/ML  SOLN  Comparison: No priors.  Findings:  Lung Bases: Unremarkable.  Abdomen/Pelvis:  The liver is massively enlarged measuring approximately 27.8 cm in craniocaudal span.  The liver parenchyma is diffusely heterogeneous with multi focal ill-defined low attenuation areas, which are nonspecific. Liver contour is very nodular.  No definite focal hepatic mass is identified at this time. Recanalized paraumbilical vein. A prominent portosystemic collateral pathway is also noted between the greater curvature of the stomach, spleen and adjacent colonic vessels.  Status post cholecystectomy.  There is some haziness adjacent to the head of the pancreas.  Pancreatic parenchyma itself is normal in appearance.  Specifically, the pancreas is enhancing normally.  The spleen is enlarged measuring 14.3 x 7.2 x 15.5 cm.  Portal vein is mildly dilated measuring 16 mm.  The appearance of the adrenal glands and bilateral kidneys is unremarkable.  Normal appendix.  No significant volume of ascites.  No pneumoperitoneum.  No pathologic distension of small bowel.  No definite pathologic lymphadenopathy identified within the abdomen or pelvis on today's CT examination.  Uterus and bilateral ovaries are unremarkable in appearance.  Urinary bladder is normal in appearance.  Musculoskeletal: There are no aggressive appearing lytic or blastic lesions noted in the visualized portions of the skeleton.  IMPRESSION: 1.  Very subtle haziness adjacent to the head of the pancreas may suggest early pancreatitis.  Clinical correlation is recommended. 2.  Severe hepatomegaly with irregular liver contour and multi focal ill-defined low attenuation hepatic lesions likely to represent regenerative nodules in the setting of cirrhosis. 3.  Stigmata of portal hypertension, as above, including splenomegaly and portosystemic collateral  pathways.   Original Report Authenticated By: Trudie Reed, M.D.    Dg Abd Acute W/chest  02/16/2013  *RADIOLOGY REPORT*  Clinical Data: Upper abdominal pain.  Nausea.  ACUTE ABDOMEN SERIES (ABDOMEN 2 VIEW & CHEST 1 VIEW)  Comparison: Chest x-ray 12/28/2012.  Findings: Lung volumes are normal.  No consolidative airspace disease.  No pleural effusions.  No pneumothorax.  No pulmonary nodule or mass noted.  Pulmonary vasculature and the cardiomediastinal silhouette are within normal limits. Atherosclerosis in the thoracic aorta.  There is a paucity of bowel gas throughout the central abdomen. There does appear to be a small amount of colonic and rectal gas and stool.  A few nondilated loops of gas-filled small bowel are noted.  No gross evidence of pneumoperitoneum.  Surgical clips are present adjacent to the lumbar spine at the level of L3.  IMPRESSION: 1.  Nonspecific, nonobstructive bowel gas pattern, as above. 2.  No pneumoperitoneum. 3.  No radiographic evidence of acute cardiopulmonary disease.   Original Report Authenticated By: Trudie Reed, M.D.      1. Pancreatitis   2. Alcoholism   3. Cirrhosis       MDM  2:25 AM patient seen and evaluated. Patient appears in moderate discomfort but no acute distress.Given patient's history of daily alcohol use concerns for possible acute pancreatitis. Will begin workup with lab testing and symptomatic treatment. Patient also has significant history for GERD and peptic ulcer or even perforated ulcer may be possible consideration.  Patient feeling much better after medications. She did have a drop in her systolic blood pressure and IV fluid boluses are being given with improvements. CT without any complicating features of pancreatitis. I've discussed with patient and significant other findings on CT  scan and lab testing. They're comfortable with returning home for outpatient treatment. Patient does not wish to have any help with her alcoholism at this  time but she was given strong recommendations to seek help.    Patient discussed in sign out with Fayrene Helper PA-C. He will reevaluate patient after IV fluid treatments.      Date: 02/16/2013  Rate: 102  Rhythm: sinus tachycardia  QRS Axis: normal  Intervals: normal  ST/T Wave abnormalities: normal  Conduction Disutrbances:none  Narrative Interpretation:   Old EKG Reviewed: unchanged    Angus Seller, PA-C 02/16/13 [phone]

## 2013-02-16 NOTE — ED Provider Notes (Signed)
Patient report received that end of shift. Patient with history of alcoholism and presents with abdominal pain which was diagnosed as pancreatitis here in the ED. Patient has been receiving IV hydration and is able to tolerates by mouth. Her vital signs stable. She continues to endorse LUQ and epigastric abdominal pain.  On exam, pt appears to be in NAD, VSS, a febrile.  Heart S1 S2, Lung CTAB, abd soft, with evidence of hepatomegaly but without ascites or caput medusae.  Tenderness to epigastric and LUQ without rebound tenderness, extremities non edematous.  Hgb were found in her urine, likely secondary to her current menstruation.  BP 120/56  Pulse 90  Temp(Src) 99.4 F (37.4 C) (Oral)  Resp 16  SpO2 95%   Fayrene Helper, PA-C 02/16/13 0740

## 2013-02-16 NOTE — ED Provider Notes (Signed)
Medical screening examination/treatment/procedure(s) were performed by non-physician practitioner and as supervising physician I was immediately available for consultation/collaboration.  John-Adam Advaith Lamarque, M.D.   John-Adam Rosalba Totty, MD 02/16/13 0737 

## 2013-02-16 NOTE — ED Notes (Signed)
Pt reported feeling nauseated and lightheaded post morphine; vitals taken, BP low.  Pt placed in Trendelenburg and pressure bag applied to fluids.  Will continue to monitor BP and notify PA.

## 2013-02-16 NOTE — ED Notes (Signed)
Pt states chest and abdominal pain began Thursday after visiting her PCP.  Pt states she drinks heavily every day, and has a hx of GERD.  Pt took antacids without relief.

## 2013-02-16 NOTE — ED Notes (Signed)
PA aware of pt's drop in BP stated to hold any more pain medication and give another bolus.

## 2013-02-18 LAB — POCT PREGNANCY, URINE: Preg Test, Ur: NEGATIVE

## 2013-02-28 ENCOUNTER — Telehealth: Payer: Self-pay

## 2013-02-28 ENCOUNTER — Ambulatory Visit: Payer: BC Managed Care – PPO | Admitting: Obstetrics & Gynecology

## 2013-02-28 NOTE — Telephone Encounter (Signed)
Pharmacy requests RF on clonazepam 0.5 mg.

## 2013-03-02 MED ORDER — CLONAZEPAM 0.5 MG PO TABS: 0.5000 mg | ORAL_TABLET | Freq: Every evening | ORAL | Status: DC | PRN

## 2013-03-02 NOTE — Telephone Encounter (Signed)
Faxed

## 2013-03-02 NOTE — Telephone Encounter (Signed)
Rx  is ready to fax  

## 2013-03-11 ENCOUNTER — Encounter (HOSPITAL_COMMUNITY): Payer: Self-pay | Admitting: *Deleted

## 2013-03-11 ENCOUNTER — Emergency Department (HOSPITAL_COMMUNITY)
Admission: EM | Admit: 2013-03-11 | Discharge: 2013-03-12 | Disposition: A | Discharge status: Home / Self Care | Payer: BC Managed Care – PPO | Attending: Emergency Medicine | Admitting: Emergency Medicine

## 2013-03-11 DIAGNOSIS — F329 Major depressive disorder, single episode, unspecified: Secondary | ICD-10-CM | POA: Insufficient documentation

## 2013-03-11 DIAGNOSIS — K219 Gastro-esophageal reflux disease without esophagitis: Secondary | ICD-10-CM | POA: Insufficient documentation

## 2013-03-11 DIAGNOSIS — Z8701 Personal history of pneumonia (recurrent): Secondary | ICD-10-CM | POA: Insufficient documentation

## 2013-03-11 DIAGNOSIS — Z87891 Personal history of nicotine dependence: Secondary | ICD-10-CM | POA: Insufficient documentation

## 2013-03-11 DIAGNOSIS — D696 Thrombocytopenia, unspecified: Secondary | ICD-10-CM | POA: Insufficient documentation

## 2013-03-11 DIAGNOSIS — Z79899 Other long term (current) drug therapy: Secondary | ICD-10-CM | POA: Insufficient documentation

## 2013-03-11 DIAGNOSIS — F411 Generalized anxiety disorder: Secondary | ICD-10-CM | POA: Insufficient documentation

## 2013-03-11 DIAGNOSIS — D649 Anemia, unspecified: Secondary | ICD-10-CM | POA: Insufficient documentation

## 2013-03-11 DIAGNOSIS — F1020 Alcohol dependence, uncomplicated: Secondary | ICD-10-CM | POA: Insufficient documentation

## 2013-03-11 DIAGNOSIS — F1010 Alcohol abuse, uncomplicated: Secondary | ICD-10-CM

## 2013-03-11 DIAGNOSIS — F3289 Other specified depressive episodes: Secondary | ICD-10-CM | POA: Insufficient documentation

## 2013-03-11 LAB — CBC WITH DIFFERENTIAL/PLATELET
Basophils Absolute: 0 10*3/uL (ref 0.0–0.1)
Basophils Relative: 0 % (ref 0–1)
Eosinophils Relative: 2 % (ref 0–5)
HCT: 30.2 % — ABNORMAL LOW (ref 36.0–46.0)
Hemoglobin: 9.8 g/dL — ABNORMAL LOW (ref 12.0–15.0)
MCH: 30.8 pg (ref 26.0–34.0)
MCHC: 32.5 g/dL (ref 30.0–36.0)
MCV: 95 fL (ref 78.0–100.0)
Monocytes Absolute: 0.4 10*3/uL (ref 0.1–1.0)
Monocytes Relative: 4 % (ref 3–12)
Neutro Abs: 4.7 10*3/uL (ref 1.7–7.7)
RDW: 14.2 % (ref 11.5–15.5)

## 2013-03-11 LAB — PROTIME-INR
INR: 1.2 (ref 0.00–1.49)
Prothrombin Time: 15 seconds (ref 11.6–15.2)

## 2013-03-11 LAB — TYPE AND SCREEN

## 2013-03-11 LAB — COMPREHENSIVE METABOLIC PANEL
BUN: 6 mg/dL (ref 6–23)
CO2: 21 mEq/L (ref 19–32)
Calcium: 8.6 mg/dL (ref 8.4–10.5)
Chloride: 103 mEq/L (ref 96–112)
Creatinine, Ser: 0.43 mg/dL — ABNORMAL LOW (ref 0.50–1.10)
GFR calc Af Amer: >90 mL/min (ref >90)
GFR calc non Af Amer: >90 mL/min (ref >90)
Total Bilirubin: 0.7 mg/dL (ref 0.3–1.2)

## 2013-03-11 LAB — APTT: aPTT: 43 seconds — ABNORMAL HIGH (ref 24–37)

## 2013-03-11 MED ORDER — SODIUM CHLORIDE 0.9 % IV SOLN
1000.0000 mL | INTRAVENOUS | Status: DC
  Administered 2013-03-11: 1000 mL via INTRAVENOUS

## 2013-03-11 NOTE — ED Notes (Signed)
Pt admits to heavy daily etoh use

## 2013-03-11 NOTE — ED Notes (Signed)
Pt states all day she's had a noise bleed, then this evening her bottom lip started bleeding w/ no injury to lip, states has had nose bleeds before but not her lip bleeding, denies pain, denies being on blood thinners, denies dizziness/weakness or lightheadedness.

## 2013-03-11 NOTE — ED Notes (Signed)
Pt reports spontaneous nosebleed at 0500 today until around 1900- not currently bleeding from nose- pt reports that her bottom lip started bleeding around 4pm and is still bleeding- denies injury, states she is not on blood thinners and is not aware of any bleeding disorders- states "my nose bleeds all the time"

## 2013-03-11 NOTE — ED Provider Notes (Signed)
History     CSN: [id number] Arrival date & time 03/11/13  1909 First MD Initiated Contact with Patient 03/11/13 2115      Chief Complaint  Patient presents with  . Bleeding/Bruising    HPI Pt noticed bleeding from her nose today although she has had some episodes off and on over the last couple of weeks.  Pt then noticed some bleeding from her lips.  All of the symptoms have stopped at this point.  The patient became concerned so she decided to come to the emergency room. Currently she is not having any active bleeding. He denies feeling lightheaded or short of breath. Patient does history of alcoholism and recent pancreatitis. She was told that she has some liver inflammation but has never had issues with gastrointestinal bleeding. The patient does continue to drink alcohol though she is trying to cut back.  Past Medical History  Diagnosis Date  . Alcoholism   . Anxiety   . Depression   . Pneumonia   . GERD (gastroesophageal reflux disease)     Past Surgical History  Procedure Laterality Date  . Cholecystectomy      Family History  Problem Relation Age of Onset  . Colon polyps Mother   . Hypertension Mother   . Thyroid disease Mother     History  Substance Use Topics  . Smoking status: Former Games developer  . Smokeless tobacco: Never Used  . Alcohol Use: 0.0 oz/week     Comment: reports 1 bottle of wine and pint of or more of vodka daily    OB History   Grav Para Term Preterm Abortions TAB SAB Ect Mult Living                  Review of Systems  All other systems reviewed and are negative.    Allergies  Morphine and related  Home Medications   Current Outpatient Rx  Name  Route  Sig  Dispense  Refill  . clonazePAM (KLONOPIN) 0.5 MG tablet   Oral   Take 1 tablet (0.5 mg total) by mouth at bedtime as needed for anxiety. For sleep   30 tablet   0   . lansoprazole (PREVACID) 30 MG capsule   Oral   Take 1 capsule (30 mg total) by mouth daily.   30 capsule  2   . Multiple Vitamin (MULTIVITAMIN WITH MINERALS) TABS   Oral   Take 1 tablet by mouth daily.         . naproxen sodium (ANAPROX) 220 MG tablet   Oral   Take 220 mg by mouth 2 (two) times daily as needed (for pain).         [name] oxyCODONE (ROXICODONE) 5 MG immediate release tablet   Oral   Take 1 tablet (5 mg total) by mouth every 4 (four) hours as needed for pain.   30 tablet   0     BP 108/64  Pulse 101  Temp(Src) 98.4 F (36.9 C) (Oral)  Resp 18  SpO2 94%  LMP 03/11/2013  Physical Exam  Nursing note and vitals reviewed. Constitutional: She appears well-developed and well-nourished. No distress.  HENT:  Head: Normocephalic and atraumatic.  Right Ear: External ear normal.  Left Ear: External ear normal.  Eyes: Conjunctivae are normal. Right eye exhibits no discharge. Left eye exhibits no discharge. No scleral icterus.  Neck: Neck supple. No tracheal deviation present.  Cardiovascular: Normal rate, regular rhythm and intact distal pulses.   Pulmonary/Chest: Effort normal  and breath sounds normal. No stridor. No respiratory distress. She has no wheezes. She has no rales.  Abdominal: Soft. Bowel sounds are normal. She exhibits no distension. There is no tenderness. There is no rebound and no guarding.  Musculoskeletal: She exhibits no edema and no tenderness.  Neurological: She is alert. She has normal strength. No sensory deficit. Cranial nerve deficit:  no gross defecits noted. She exhibits normal muscle tone. She displays no seizure activity. Coordination normal.  Skin: Skin is warm and dry. No rash noted.  Spider telangectasias  Psychiatric: She has a normal mood and affect.    ED Course  Procedures (including critical care time)  Labs Reviewed  COMPREHENSIVE METABOLIC PANEL - Abnormal; Notable for the following:    Glucose, Bld 126 (*)    Creatinine, Ser 0.43 (*)    Total Protein 8.5 (*)    Albumin 3.2 (*)    AST 156 (*)    ALT 54 (*)    Alkaline  Phosphatase 126 (*)    All other components within normal limits  APTT - Abnormal; Notable for the following:    aPTT 43 (*)    All other components within normal limits  CBC WITH DIFFERENTIAL - Abnormal; Notable for the following:    RBC 3.18 (*)    Hemoglobin 9.8 (*)    HCT 30.2 (*)    Platelets 134 (*)    All other components within normal limits  ETHANOL - Abnormal; Notable for the following:    Alcohol, Ethyl (B) 195 (*)    All other components within normal limits  PROTIME-INR  TYPE AND SCREEN  ABO/RH   No results found.   1. ALCOHOL ABUSE   2. Anemia   3. Thrombocytopenia       MDM  Patient's labs appear to be at baseline. She has mild thrombocytopenia but this is no different than usual. Her hemoglobin is 9.8 which is not significantly lower than her most recent hemoglobin. The patient is not currently having any active bleeding. Once again counseled her on the dangers of her alcohol abuse. Patient understands at this time she is stable for discharge.        Celene Kras, MD 03/11/13 [phone]

## 2013-03-14 ENCOUNTER — Ambulatory Visit: Payer: BC Managed Care – PPO | Admitting: Obstetrics & Gynecology

## 2013-03-29 ENCOUNTER — Other Ambulatory Visit: Payer: Self-pay | Admitting: Emergency Medicine

## 2013-04-16 ENCOUNTER — Ambulatory Visit (INDEPENDENT_AMBULATORY_CARE_PROVIDER_SITE_OTHER): Payer: BC Managed Care – PPO | Admitting: Internal Medicine

## 2013-04-16 VITALS — BP 142/72 | HR 136 | Temp 98.3°F | Resp 20 | Ht 62.5 in | Wt 188.0 lb

## 2013-04-16 DIAGNOSIS — T783XXA Angioneurotic edema, initial encounter: Secondary | ICD-10-CM

## 2013-04-16 DIAGNOSIS — F411 Generalized anxiety disorder: Secondary | ICD-10-CM

## 2013-04-16 DIAGNOSIS — L500 Allergic urticaria: Secondary | ICD-10-CM

## 2013-04-16 MED ORDER — METHYLPREDNISOLONE SODIUM SUCC 125 MG IJ SOLR
125.0000 mg | Freq: Once | INTRAMUSCULAR | Status: AC
  Administered 2013-04-16: 125 mg via INTRAMUSCULAR

## 2013-04-16 MED ORDER — CETIRIZINE HCL 10 MG PO TABS: 10.0000 mg | ORAL_TABLET | Freq: Every day | ORAL | Status: DC

## 2013-04-16 MED ORDER — RANITIDINE HCL 300 MG PO TABS: 300.0000 mg | ORAL_TABLET | Freq: Every day | ORAL | Status: DC

## 2013-04-16 MED ORDER — CLONAZEPAM 0.5 MG PO TABS: 0.5000 mg | ORAL_TABLET | Freq: Two times a day (BID) | ORAL | Status: DC | PRN

## 2013-04-16 NOTE — Progress Notes (Signed)
   [address], [place] [address]   Phone [phone]  Subjective:    Patient ID: Sara [name], female    DOB: 1976/09/03, 37 y.o.   MRN: [id number]  HPI  Pt presents to clinic with swollen lips and hives that started this am when she was taking her daughter the school.  It was a normal morning for the patient.  She has changed no lotions, soaps or detergents.  No new foods.  She has has this several times in the past but this is the worst yet.  She has no SOB or tongue swelling.  She has h/o generalized anxiety d/o and been on several daily meds but none really help her - the last meds she was on was the Klonopin but she ran out.  She mainly used that to sleep.  Lately she has been under increased stress. She has not noticed that these episodes are related to increased stress.  Review of Systems  Skin: Positive for rash.       Objective:   Physical Exam  Vitals reviewed. Constitutional: She is oriented to person, place, and time. She appears well-developed and well-nourished.  HENT:  Head: Normocephalic and atraumatic.  Right Ear: External ear normal.  Left Ear: External ear normal.  Cardiovascular: Normal rate, regular rhythm and normal heart sounds.   No murmur heard. Pulmonary/Chest: Effort normal and breath sounds normal.  Neurological: She is alert and oriented to person, place, and time.  Skin: Skin is warm and dry.  Urticarial rash on back and arms.  Bottom lip is swollen.  Tongue is not swollen.    Psychiatric: She has a normal mood and affect. Her behavior is normal. Judgment and thought content normal.       Assessment & Plan:  Angioedema of lips, initial encounter - unsure of exact etiology of reaction but I question whether is may be partially a stress reaction.  - Plan: cetirizine (ZYRTEC) 10 MG tablet, ranitidine (ZANTAC) 300 MG tablet, Ambulatory referral to Allergy, methylPREDNISolone sodium succinate (SOLU-MEDROL) 125 mg/2 mL injection 125 mg (Zyrtec 10 mg  and Zantac 300mg  given in the office)  Anxiety state, unspecified -seems like patient might needs a daily medication - we will restarted her Klonopin and she will f/u with Dr Merla Riches to determine the next step.- Plan: clonazePAM (KLONOPIN) 0.5 MG tablet  Allergic urticaria - Plan: cetirizine (ZYRTEC) 10 MG tablet, ranitidine (ZANTAC) 300 MG tablet, Ambulatory referral to Allergy, methylPREDNISolone sodium succinate (SOLU-MEDROL) 125 mg/2 mL injection 125 mg  She is starts to have worsening symptoms esp tongue/throat swelling or SOB - To ED.  D/w Dr Perrin Maltese.   Benny Lennert PA-C 04/16/2013 1:53 PM

## 2013-04-16 NOTE — Patient Instructions (Addendum)
Pt to make an appt with Dr Merla Riches - take the meds for 5 days and then as needed for recurrent hives until she sees the allergist.  Take the klonopin for anxiety.

## 2013-05-27 ENCOUNTER — Emergency Department (HOSPITAL_COMMUNITY)
Admission: EM | Admit: 2013-05-27 | Discharge: 2013-05-27 | Disposition: A | Discharge status: Home / Self Care | Payer: BC Managed Care – PPO | Attending: Emergency Medicine | Admitting: Emergency Medicine

## 2013-05-27 ENCOUNTER — Encounter (HOSPITAL_COMMUNITY): Payer: Self-pay

## 2013-05-27 DIAGNOSIS — K219 Gastro-esophageal reflux disease without esophagitis: Secondary | ICD-10-CM | POA: Insufficient documentation

## 2013-05-27 DIAGNOSIS — F1010 Alcohol abuse, uncomplicated: Secondary | ICD-10-CM | POA: Insufficient documentation

## 2013-05-27 DIAGNOSIS — Z87891 Personal history of nicotine dependence: Secondary | ICD-10-CM | POA: Insufficient documentation

## 2013-05-27 DIAGNOSIS — Z8701 Personal history of pneumonia (recurrent): Secondary | ICD-10-CM | POA: Insufficient documentation

## 2013-05-27 DIAGNOSIS — K7460 Unspecified cirrhosis of liver: Secondary | ICD-10-CM

## 2013-05-27 DIAGNOSIS — N925 Other specified irregular menstruation: Secondary | ICD-10-CM | POA: Insufficient documentation

## 2013-05-27 DIAGNOSIS — F3289 Other specified depressive episodes: Secondary | ICD-10-CM | POA: Insufficient documentation

## 2013-05-27 DIAGNOSIS — N938 Other specified abnormal uterine and vaginal bleeding: Secondary | ICD-10-CM | POA: Insufficient documentation

## 2013-05-27 DIAGNOSIS — F329 Major depressive disorder, single episode, unspecified: Secondary | ICD-10-CM | POA: Insufficient documentation

## 2013-05-27 DIAGNOSIS — F411 Generalized anxiety disorder: Secondary | ICD-10-CM | POA: Insufficient documentation

## 2013-05-27 DIAGNOSIS — D696 Thrombocytopenia, unspecified: Secondary | ICD-10-CM

## 2013-05-27 DIAGNOSIS — K7030 Alcoholic cirrhosis of liver without ascites: Secondary | ICD-10-CM | POA: Insufficient documentation

## 2013-05-27 DIAGNOSIS — N949 Unspecified condition associated with female genital organs and menstrual cycle: Secondary | ICD-10-CM | POA: Insufficient documentation

## 2013-05-27 DIAGNOSIS — R110 Nausea: Secondary | ICD-10-CM | POA: Insufficient documentation

## 2013-05-27 DIAGNOSIS — Z3202 Encounter for pregnancy test, result negative: Secondary | ICD-10-CM | POA: Insufficient documentation

## 2013-05-27 LAB — COMPREHENSIVE METABOLIC PANEL
Alkaline Phosphatase: 112 U/L (ref 39–117)
BUN: 4 mg/dL — ABNORMAL LOW (ref 6–23)
GFR calc Af Amer: >90 mL/min (ref >90)
Glucose, Bld: 146 mg/dL — ABNORMAL HIGH (ref 70–99)
Potassium: 3.6 mEq/L (ref 3.5–5.1)
Total Protein: 8.5 g/dL — ABNORMAL HIGH (ref 6.0–8.3)

## 2013-05-27 LAB — CBC
HCT: 31.2 % — ABNORMAL LOW (ref 36.0–46.0)
MCH: 31.2 pg (ref 26.0–34.0)
MCV: 94.5 fL (ref 78.0–100.0)
RDW: 16.7 % — ABNORMAL HIGH (ref 11.5–15.5)
WBC: 6 10*3/uL (ref 4.0–10.5)

## 2013-05-27 LAB — WET PREP, GENITAL
Trich, Wet Prep: NONE SEEN
Yeast Wet Prep HPF POC: NONE SEEN

## 2013-05-27 LAB — POCT PREGNANCY, URINE: Preg Test, Ur: NEGATIVE

## 2013-05-27 LAB — LIPASE, BLOOD: Lipase: 77 U/L — ABNORMAL HIGH (ref 11–59)

## 2013-05-27 LAB — TYPE AND SCREEN: ABO/RH(D): A POS

## 2013-05-27 MED ORDER — SODIUM CHLORIDE 0.9 % IV BOLUS (SEPSIS)
1000.0000 mL | Freq: Once | INTRAVENOUS | Status: AC
  Administered 2013-05-27: 1000 mL via INTRAVENOUS

## 2013-05-27 MED ORDER — ONDANSETRON HCL 4 MG/2ML IJ SOLN
4.0000 mg | Freq: Once | INTRAMUSCULAR | Status: AC
  Administered 2013-05-27: 4 mg via INTRAVENOUS
  Filled 2013-05-27: qty 2

## 2013-05-27 MED ORDER — MEDROXYPROGESTERONE ACETATE 10 MG PO TABS: 10.0000 mg | ORAL_TABLET | Freq: Every day | ORAL | Status: DC

## 2013-05-27 NOTE — ED Notes (Signed)
Patient reports that she began having vaginal bleeding with clots and a nose bleed yesterday. Patient states she is still having vaginal bleeding and a small amount of blood from her nose when she blows. Patient denies any unusual bruising .

## 2013-05-27 NOTE — ED Provider Notes (Signed)
History    CSN: [id number] Arrival date & time 05/27/13  0718  First MD Initiated Contact with Patient 05/27/13 [phone]     Chief Complaint  Patient presents with  . Vaginal Bleeding   (Consider location/radiation/quality/duration/timing/severity/associated sxs/prior Treatment) The history is provided by the patient.  pt reports heavy vaginal bleeding since last night. Hx of DUB. Does not know  When last normal menstrual period was. Reports blood clots. No abd pain. Nausea without vomiting. No fever. No diarrhea. No vaginal complaints. No flank pain. No dysuria or urinary symptoms Past Medical History  Diagnosis Date  . Alcoholism   . Anxiety   . Depression   . Pneumonia   . GERD (gastroesophageal reflux disease)    Past Surgical History  Procedure Laterality Date  . Cholecystectomy     Family History  Problem Relation Age of Onset  . Colon polyps Mother   . Hypertension Mother   . Thyroid disease Mother    History  Substance Use Topics  . Smoking status: Former Games developer  . Smokeless tobacco: Never Used  . Alcohol Use: 0.0 oz/week     Comment: reports 1 bottle of wine and pint of or more of vodka daily   OB History   Grav Para Term Preterm Abortions TAB SAB Ect Mult Living                 Review of Systems  Genitourinary: Positive for vaginal bleeding.  All other systems reviewed and are negative.    Allergies  Morphine and related  Home Medications   Current Outpatient Rx  Name  Route  Sig  Dispense  Refill  . cetirizine (ZYRTEC) 10 MG tablet   Oral   Take 1 tablet (10 mg total) by mouth daily.   30 tablet   0   . Multiple Vitamin (MULTIVITAMIN WITH MINERALS) TABS   Oral   Take 1 tablet by mouth daily.         . ranitidine (ZANTAC) 300 MG tablet   Oral   Take 1 tablet (300 mg total) by mouth at bedtime.   30 tablet   0    BP 145/64  Pulse 105  Temp(Src) 98.9 F (37.2 C) (Oral)  Resp 16  SpO2 96%  LMP 05/27/2013 Physical Exam  Nursing  note and vitals reviewed. Constitutional: She is oriented to person, place, and time. She appears well-developed and well-nourished. No distress.  HENT:  Head: Normocephalic and atraumatic.  Stigmata of recent nosebleeds in the right nare without active bleeding.  No obvious posterior nosebleed noted.  No blood in posterior pharynx  Eyes: EOM are normal.  Neck: Normal range of motion.  Cardiovascular: Normal rate, regular rhythm and normal heart sounds.   Pulmonary/Chest: Effort normal and breath sounds normal.  Abdominal: Soft. She exhibits no distension. There is no tenderness.  Genitourinary:  Normal external genitalia.  Small amount of bleeding from her cervix.  No cervical lesions noted.  Small clots noted in her vaginal vault.  Musculoskeletal: Normal range of motion.  Neurological: She is alert and oriented to person, place, and time.  Skin: Skin is warm and dry.  Psychiatric: She has a normal mood and affect. Judgment normal.    ED Course  Procedures (including critical care time) Labs Reviewed  CBC - Abnormal; Notable for the following:    RBC 3.30 (*)    Hemoglobin 10.3 (*)    HCT 31.2 (*)    RDW 16.7 (*)  Platelets 129 (*)    All other components within normal limits  COMPREHENSIVE METABOLIC PANEL - Abnormal; Notable for the following:    Glucose, Bld 146 (*)    BUN 4 (*)    Creatinine, Ser 0.40 (*)    Total Protein 8.5 (*)    Albumin 3.2 (*)    AST 174 (*)    ALT 62 (*)    All other components within normal limits  LIPASE, BLOOD - Abnormal; Notable for the following:    Lipase 77 (*)    All other components within normal limits  GC/CHLAMYDIA PROBE AMP  WET PREP, GENITAL  POCT PREGNANCY, URINE  TYPE AND SCREEN   No results found. 1. DUB (dysfunctional uterine bleeding)   2. Alcohol abuse   3. Cirrhosis   4. Thrombocytopenia     MDM  This is dysfunctional uterine bleeding.  She be placed on a ten-day course of medroxyprogesterone acetate.  GYN  followup.  She understands to followup at [facility] hospital for worsening vaginal bleeding.  Her hemoglobin her vital signs are without significant abnormality here.  I along discussion with her regarding her alcohol use/abuse.  Her CT scan earlier this year demonstrated evidence of cirrhosis and portal hypertension.  I informed her that her ongoing alcohol use is likely exacerbating her symptoms given her thrombocytopenia and probably coagulopathy.  Outpatient resources for alcohol abuse were given.  She understands the importance of followup with her primary care physician and OB/GYN.  Lyanne Co, MD 05/27/13 [phone]

## 2013-05-28 LAB — GC/CHLAMYDIA PROBE AMP: CT Probe RNA: NEGATIVE

## 2013-05-31 ENCOUNTER — Ambulatory Visit (INDEPENDENT_AMBULATORY_CARE_PROVIDER_SITE_OTHER): Payer: BC Managed Care – PPO | Admitting: Obstetrics & Gynecology

## 2013-05-31 ENCOUNTER — Encounter: Payer: Self-pay | Admitting: Obstetrics & Gynecology

## 2013-05-31 VITALS — BP 104/72 | HR 105 | Temp 98.3°F | Ht 64.0 in | Wt 188.0 lb

## 2013-05-31 DIAGNOSIS — N939 Abnormal uterine and vaginal bleeding, unspecified: Secondary | ICD-10-CM

## 2013-05-31 DIAGNOSIS — Z8742 Personal history of other diseases of the female genital tract: Secondary | ICD-10-CM | POA: Insufficient documentation

## 2013-05-31 DIAGNOSIS — N926 Irregular menstruation, unspecified: Secondary | ICD-10-CM

## 2013-05-31 DIAGNOSIS — Z3202 Encounter for pregnancy test, result negative: Secondary | ICD-10-CM

## 2013-05-31 MED ORDER — ETONOGESTREL-ETHINYL ESTRADIOL 0.12-0.015 MG/24HR VA RING: VAGINAL_RING | VAGINAL | Status: DC

## 2013-05-31 MED ORDER — MEDROXYPROGESTERONE ACETATE 10 MG PO TABS: 20.0000 mg | ORAL_TABLET | Freq: Three times a day (TID) | ORAL | Status: DC

## 2013-05-31 NOTE — Progress Notes (Signed)
.   Subjective:     Sara [name] is a 37 y.o. female here for a problem exam.  Current complaints: She has been bleeding very heavy with large clots since 05/26/13.  Some days she is changing her pad every 30 minutes. She was using depo provera for a couple of months - last injection was in January 2014.  Personal health questionnaire reviewed: no.   Gynecologic History Patient's last menstrual period was 05/26/2013. Contraception: none Last Pap:05/2012. Results were: normal Last mammogram: 2010. Results were: normal  Obstetric History OB History   Grav Para Term Preterm Abortions TAB SAB Ect Mult Living                   The following portions of the patient's history were reviewed and updated as appropriate: allergies, current medications, past family history, past medical history, past social history, past surgical history and problem list.  Review of Systems Pertinent items are noted in HPI.    Objective:   General:  alert     Abdomen: soft, non-tender; bowel sounds normal; no masses,  no organomegaly   Vulva:  normal  Vagina: Moderate heme  Cervix:  no lesions  Corpus: normal size, contour, position, consistency, mobility, non-tender  Adnexa:  normal adnexa    Assessment:   Episode of prolonged, heavy bleeding in setting of Depo provera  Plan:   High dose provera orally x 7d-->Nuva ring Fe/VIt Keep menstrual calendar Return prn or in 3 mths

## 2013-05-31 NOTE — Patient Instructions (Addendum)
Abnormal Uterine Bleeding  Abnormal uterine bleeding can have many causes. Some cases are simply treated, while others are more serious. There are several kinds of bleeding that is considered abnormal, including:  · Bleeding between periods.  · Bleeding after sexual intercourse.  · Spotting anytime in the menstrual cycle.  · Bleeding heavier or more than normal.  · Bleeding after menopause.  CAUSES   There are many causes of abnormal uterine bleeding. It can be present in teenagers, pregnant women, women during their reproductive years, and women who have reached menopause. Your caregiver will look for the more common causes depending on your age, signs, symptoms and your particular circumstance. Most cases are not serious and can be treated. Even the more serious causes, like cancer of the female organs, can be treated adequately if found in the early stages. That is why all types of bleeding should be evaluated and treated as soon as possible.  DIAGNOSIS   Diagnosing the cause may take several kinds of tests. Your caregiver may:  · Take a complete history of the type of bleeding.  · Perform a complete physical exam and Pap smear.  · Take an ultrasound on the abdomen showing a picture of the female organs and the pelvis.  · Inject dye into the uterus and Fallopian tubes and X-ray them (hysterosalpingogram).  · Place fluid in the uterus and do an ultrasound (sonohysterogrqphy).  · Take a CT scan to examine the female organs and pelvis.  · Take an MRI to examine the female organs and pelvis. There is no X-ray involved with this procedure.  · Look inside the uterus with a telescope that has a light at the end (hysteroscopy).  · Scrap the inside of the uterus to get tissue to examine (Dilatation and Curettage, D&C).  · Look into the pelvis with a telescope that has a light at the end (laparoscopy). This is done through a very small cut (incision) in the abdomen.  TREATMENT   Treatment will depend on the cause of the  abnormal bleeding. It can include:  · Doing nothing to allow the problem to take care of itself over time.  · Hormone treatment.  · Birth control pills.  · Treating the medical condition causing the problem.  · Laparoscopy.  · Major or minor surgery  · Destroying the lining of the uterus with electrical currant, laser, freezing or heat (uterine ablation).  HOME CARE INSTRUCTIONS   · Follow your caregiver's recommendation on how to treat your problem.  · See your caregiver if you missed a menstrual period and think you may be pregnant.  · If you are bleeding heavily, count the number of pads/tampons you use and how often you have to change them. Tell this to your caregiver.  · Avoid sexual intercourse until the problem is controlled.  SEEK MEDICAL CARE IF:   · You have any kind of abnormal bleeding mentioned above.  · You feel dizzy at times.  · You are 37 years old and have not had a menstrual period yet.  SEEK IMMEDIATE MEDICAL CARE IF:   · You pass out.  · You are changing pads/tampons every 15 to 30 minutes.  · You have belly (abdominal) pain.  · You have a temperature of 100° F (37.8° C) or higher.  · You become sweaty or weak.  · You are passing large blood clots from the vagina.  · You start to feel sick to your stomach (nauseous) and throw up (vomit).    Document Released: 11/07/2005 Document Revised: 01/30/2012 Document Reviewed: 04/02/2009  ExitCare® Patient Information ©2014 [employer].

## 2013-06-30 ENCOUNTER — Inpatient Hospital Stay (HOSPITAL_COMMUNITY): Payer: BC Managed Care – PPO

## 2013-06-30 ENCOUNTER — Inpatient Hospital Stay (HOSPITAL_COMMUNITY)
Admission: AD | Admit: 2013-06-30 | Discharge: 2013-07-02 | DRG: 202 | Disposition: A | Discharge status: Home / Self Care | Payer: BC Managed Care – PPO | Source: Ambulatory Visit | Attending: Family Medicine | Admitting: Family Medicine

## 2013-06-30 ENCOUNTER — Ambulatory Visit (INDEPENDENT_AMBULATORY_CARE_PROVIDER_SITE_OTHER): Payer: BC Managed Care – PPO | Admitting: Family Medicine

## 2013-06-30 ENCOUNTER — Encounter (HOSPITAL_COMMUNITY): Payer: Self-pay | Admitting: Family Medicine

## 2013-06-30 VITALS — BP 112/68 | HR 106 | Temp 98.0°F | Resp 16 | Ht 64.0 in | Wt 189.0 lb

## 2013-06-30 DIAGNOSIS — R011 Cardiac murmur, unspecified: Secondary | ICD-10-CM

## 2013-06-30 DIAGNOSIS — F32A Depression, unspecified: Secondary | ICD-10-CM | POA: Diagnosis present

## 2013-06-30 DIAGNOSIS — F419 Anxiety disorder, unspecified: Secondary | ICD-10-CM | POA: Insufficient documentation

## 2013-06-30 DIAGNOSIS — R109 Unspecified abdominal pain: Secondary | ICD-10-CM

## 2013-06-30 DIAGNOSIS — F410 Panic disorder [episodic paroxysmal anxiety] without agoraphobia: Secondary | ICD-10-CM

## 2013-06-30 DIAGNOSIS — K859 Acute pancreatitis without necrosis or infection, unspecified: Secondary | ICD-10-CM

## 2013-06-30 DIAGNOSIS — R1013 Epigastric pain: Secondary | ICD-10-CM

## 2013-06-30 DIAGNOSIS — F411 Generalized anxiety disorder: Secondary | ICD-10-CM | POA: Diagnosis present

## 2013-06-30 DIAGNOSIS — R002 Palpitations: Secondary | ICD-10-CM | POA: Diagnosis present

## 2013-06-30 DIAGNOSIS — R000 Tachycardia, unspecified: Secondary | ICD-10-CM | POA: Diagnosis present

## 2013-06-30 DIAGNOSIS — D649 Anemia, unspecified: Secondary | ICD-10-CM | POA: Diagnosis present

## 2013-06-30 DIAGNOSIS — Z87891 Personal history of nicotine dependence: Secondary | ICD-10-CM

## 2013-06-30 DIAGNOSIS — F329 Major depressive disorder, single episode, unspecified: Secondary | ICD-10-CM | POA: Diagnosis present

## 2013-06-30 DIAGNOSIS — Z885 Allergy status to narcotic agent status: Secondary | ICD-10-CM

## 2013-06-30 DIAGNOSIS — R112 Nausea with vomiting, unspecified: Secondary | ICD-10-CM

## 2013-06-30 DIAGNOSIS — F3289 Other specified depressive episodes: Secondary | ICD-10-CM | POA: Diagnosis present

## 2013-06-30 DIAGNOSIS — Z9089 Acquired absence of other organs: Secondary | ICD-10-CM

## 2013-06-30 DIAGNOSIS — F1010 Alcohol abuse, uncomplicated: Secondary | ICD-10-CM

## 2013-06-30 DIAGNOSIS — K7030 Alcoholic cirrhosis of liver without ascites: Principal | ICD-10-CM | POA: Diagnosis present

## 2013-06-30 DIAGNOSIS — K219 Gastro-esophageal reflux disease without esophagitis: Secondary | ICD-10-CM | POA: Diagnosis present

## 2013-06-30 DIAGNOSIS — F1020 Alcohol dependence, uncomplicated: Secondary | ICD-10-CM | POA: Diagnosis present

## 2013-06-30 DIAGNOSIS — K766 Portal hypertension: Secondary | ICD-10-CM | POA: Diagnosis present

## 2013-06-30 HISTORY — DX: Unspecified cirrhosis of liver: K74.60

## 2013-06-30 HISTORY — DX: Portal hypertension: K76.6

## 2013-06-30 HISTORY — DX: Personal history of other medical treatment: Z92.89

## 2013-06-30 HISTORY — DX: Encounter for follow-up examination after completed treatment for conditions other than malignant neoplasm: Z09

## 2013-06-30 LAB — POCT CBC
Granulocyte percent: 64.7 %G (ref 37–80)
HCT, POC: 30.3 % — AB (ref 37.7–47.9)
Hemoglobin: 9 g/dL — AB (ref 12.2–16.2)
Lymph, poc: 1.7 (ref 0.6–3.4)
POC Granulocyte: 3.8 (ref 2–6.9)
RBC: 3.04 M/uL — AB (ref 4.04–5.48)

## 2013-06-30 LAB — RAPID URINE DRUG SCREEN, HOSP PERFORMED
Barbiturates: NOT DETECTED
Cocaine: NOT DETECTED
Opiates: NOT DETECTED

## 2013-06-30 LAB — COMPREHENSIVE METABOLIC PANEL
Albumin: 3.6 g/dL (ref 3.5–5.2)
CO2: 25 mEq/L (ref 19–32)
Chloride: 103 mEq/L (ref 96–112)
Glucose, Bld: 157 mg/dL — ABNORMAL HIGH (ref 70–99)
Potassium: 3.8 mEq/L (ref 3.5–5.3)
Sodium: 137 mEq/L (ref 135–145)
Total Protein: 8.1 g/dL (ref 6.0–8.3)

## 2013-06-30 LAB — LIPASE: Lipase: 54 U/L (ref 0–75)

## 2013-06-30 LAB — PROTIME-INR: INR: 1.33 (ref 0.00–1.49)

## 2013-06-30 MED ORDER — FOLIC ACID 1 MG PO TABS
1.0000 mg | ORAL_TABLET | Freq: Every day | ORAL | Status: DC
  Administered 2013-06-30 – 2013-07-02 (×3): 1 mg via ORAL
  Filled 2013-06-30 (×3): qty 1

## 2013-06-30 MED ORDER — LORAZEPAM 1 MG PO TABS
1.0000 mg | ORAL_TABLET | Freq: Four times a day (QID) | ORAL | Status: DC | PRN
  Administered 2013-07-02 (×2): 1 mg via ORAL
  Filled 2013-06-30 (×3): qty 1

## 2013-06-30 MED ORDER — SODIUM CHLORIDE 0.9 % IJ SOLN
3.0000 mL | Freq: Two times a day (BID) | INTRAMUSCULAR | Status: DC
  Administered 2013-06-30 – 2013-07-02 (×3): 3 mL via INTRAVENOUS

## 2013-06-30 MED ORDER — ADULT MULTIVITAMIN W/MINERALS CH
1.0000 | ORAL_TABLET | Freq: Every day | ORAL | Status: DC
  Administered 2013-07-01 – 2013-07-02 (×2): 1 via ORAL
  Filled 2013-06-30 (×2): qty 1

## 2013-06-30 MED ORDER — VITAMIN B-1 100 MG PO TABS
100.0000 mg | ORAL_TABLET | Freq: Every day | ORAL | Status: DC
  Administered 2013-06-30 – 2013-07-02 (×3): 100 mg via ORAL
  Filled 2013-06-30 (×3): qty 1

## 2013-06-30 MED ORDER — ALUM & MAG HYDROXIDE-SIMETH 200-200-20 MG/5ML PO SUSP: 30.0000 mL | Freq: Four times a day (QID) | ORAL | Status: DC | PRN

## 2013-06-30 MED ORDER — ONDANSETRON HCL 4 MG PO TABS: 4.0000 mg | ORAL_TABLET | Freq: Four times a day (QID) | ORAL | Status: DC | PRN

## 2013-06-30 MED ORDER — LORAZEPAM 1 MG PO TABS
1.0000 mg | ORAL_TABLET | Freq: Once | ORAL | Status: AC
  Administered 2013-06-30: 1 mg via ORAL

## 2013-06-30 MED ORDER — ONDANSETRON 4 MG PO TBDP
4.0000 mg | ORAL_TABLET | Freq: Once | ORAL | Status: AC
  Administered 2013-06-30: 4 mg via ORAL

## 2013-06-30 MED ORDER — THIAMINE HCL 100 MG/ML IJ SOLN
Freq: Once | INTRAVENOUS | Status: AC
  Administered 2013-06-30: 17:00:00 via INTRAVENOUS
  Filled 2013-06-30: qty 1000

## 2013-06-30 MED ORDER — ONDANSETRON HCL 4 MG/2ML IJ SOLN: 4.0000 mg | Freq: Four times a day (QID) | INTRAMUSCULAR | Status: DC | PRN

## 2013-06-30 MED ORDER — PANTOPRAZOLE SODIUM 40 MG IV SOLR
40.0000 mg | INTRAVENOUS | Status: DC
  Administered 2013-06-30 – 2013-07-01 (×2): 40 mg via INTRAVENOUS
  Filled 2013-06-30 (×3): qty 40

## 2013-06-30 MED ORDER — LORAZEPAM 2 MG/ML IJ SOLN
1.0000 mg | Freq: Four times a day (QID) | INTRAMUSCULAR | Status: DC | PRN
  Administered 2013-06-30 – 2013-07-02 (×6): 1 mg via INTRAVENOUS
  Filled 2013-06-30 (×7): qty 1

## 2013-06-30 NOTE — H&P (Signed)
Family Medicine Teaching [facility] Admission History and Physical Service Pager: [phone]  Patient name: Sara [name] Medical record number: [id number] Date of birth: Nov 08, 1976 Age: 37 y.o. Gender: female  Primary Care Provider: Tonye Pearson, MD Consultants: None Code Status: Full Code  Chief Complaint: Alcohol Abuse, Anxiety, Abdominal Pain   Assessment and Plan: Sara [name] is a 37 y.o. year old female presenting with alcohol abuse, anxiety, and abdominal pain . PMH is significant for alcohol abuse, cirrhosis, history of alcoholic pancreatitis in March 2013  #Abdominal Pain in patient with known alcoholic cirrhosis (portal hypertension also previously noted). Unclear etiology. Lipase not elevated. Known cirrhotic. Evaluate as below. Patient appears clinically well but exhibits guarding on exam. No rebound. Afebrile. No WBC elevation, confusion, fever to suggest SBP. No urinary symptoms to suggest UTI. S/p cholecystectomy. No vomiting and regular BM so doubt SBO. No KUB at this time. Diffuse pain unlikely to be GERD.  -NPO until u/s, consider mechanical soft diet afterwards -PPI IV for GERD [ ] f/u abdominal exams if pain worsens.  -as well appearing, hold off on pain medicine for now [ ]  f/u complete abdominal u/s (evaluate for ascites) -consider therapeutic paracentesis (although would prefer medical management) [ ]  f/u upreg [ ]  FOBT. But no hematochezia, melena, BRBPR to suggest esophageal varices.  -needs outpatient f/u with PCP regarding cirrhosis including appropriate vaccinations and HCC monitoring. Patient appears to have had EGD before but will likely need screening again in future for development of varices.  -transaminases appear stable from previous/slightly lower.  [ ] check PT/INR  #Alcohol abuse/Depression/anxiety -CIWA protocol with 1x ativan to start [ ]  obtain UDS d/t history alcohol abuse and dilated pupils [ ] f/u SW consult for alchol detox  (states doesn't want to go to behavioral health and has appt on Wednesday with pscyh so possible outpatient treatment) -will need additional support for anxiety through psychiatry regardless in order to be successful -could consider medication such as librium as bridge  -thiamine, folate, banana bag (100 ml/hr for now) [ ] f/u TSH [ ]  f/u alcohol level.  -last drink last night   # Tachycardia/previous palpitations -TSH as above { [ ]  EKG -appears mildly dehdyrate-banana bag x 1 for now at 100 cc/hr  #Anemia -monitor daily CBC -FOBT as above -seen 05/31/13 by ob/gyn for heavy menstrual bleeding-most likely etiology  -check pt/inr  FEN/GI: NPO for now, mechanical soft if no ascites. Hydrate at 100 ml/hr for now though with admission that if has ascites, may need to stop fluid and start diuresis Prophylaxis: SCDs due to anemia   Disposition: admit to inpatient Dr. Armen Pickup and Dr. Leveda Anna attending. Pending further workup   History of Present Illness: Sara [name] is a 37 y.o. year old female presenting with Alcohol Abuse, Anxiety, Abdominal Pain.   Patient states overall she just "hasn't been feeling well" for last month so went to [facility] today. States she has been more anxious over last month and tries to self medicate with alcohol drinking 2 bottles of wine per day and a pint of liquor (up from 1 bottle per day and sometimes with pint of liquor). States she will feel palpitations occasionally along with her feelings of anxiety. Anxiety has not been controlled for years. Has been on celexa and lexapro in the past. States she would like detox from alcohol but has been unsuccessful int he past and prefers not to go back to behavioral health. Does have psych appt on Wednesday arranged already.  States over last 4-5 days has developed diffuse abdominal fullness and pain though pain localizes most just left of umbilicus. Pain up to 6/10. No fevers/chills/vomiting. Does feel nauseous  during this time with decreased PO. Denies/dysuria/polyuria. Normal LMP within last month (though records show heavy period early July). Using nuvaring for birth control. No constipation (regular BM). No hematochezia or melena or BRBPR. Pain feels a lot like case of pancreatitis she has in march.     Review Of Systems: Per HPI  Otherwise 12 point review of systems was performed and was unremarkable.  Patient Active Problem List   Diagnosis Date Noted  . Anxiety state, unspecified 06/30/2013  . Panic attack 06/30/2013  . Cirrhosis with alcoholism 06/30/2013  . Alcoholism   . Depression   . GERD (gastroesophageal reflux disease)   . Portal hypertension   . Abnormal uterine bleeding 05/31/2013  . ALCOHOL ABUSE 01/07/2011   Past Medical History: Past Medical History  Diagnosis Date  . Alcoholism   . Anxiety   . Depression   . Pneumonia   . GERD (gastroesophageal reflux disease)   . Cirrhosis   . Portal hypertension   . S/P alcohol detoxification     2-3 days at behavioral health previously   Past Surgical History: Past Surgical History  Procedure Laterality Date  . Cholecystectomy     Social History: History  Substance Use Topics  . Smoking status: Former Games developer  . Smokeless tobacco: Never Used  . Alcohol Use: 0.0 oz/week     Comment: reports 1 bottle of wine and pint of or more of vodka daily. 2 bottles of wine currently and pint of vodka some days for at least 4-5 days.a month.     Additional social history:  History   Social History Narrative   Lives with husband and 2 children. 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  Please also refer to relevant sections of EMR.  Family History: Family History  Problem Relation Age of Onset  . Colon polyps Mother   . Hypertension Mother   . Thyroid disease Mother   . Alcoholism Mother   . Alcoholism Father    Allergies and Medications: Allergies  Allergen Reactions  . Morphine And Related     Slowed HR, lowered BP   No current  facility-administered medications on file prior to encounter.   Current Outpatient Prescriptions on File Prior to Encounter  PATIENT STATES ONLY USING NUVARING  Medication Sig Dispense Refill  . etonogestrel-ethinyl estradiol (NUVARING) 0.12-0.015 MG/24HR vaginal ring Insert vaginally and leave in place for 3 consecutive weeks, then remove for 1 week.  1 each  12  . Multiple Vitamin (MULTIVITAMIN WITH MINERALS) TABS Take 1 tablet by mouth daily.      . ranitidine (ZANTAC) 300 MG tablet Take 1 tablet (300 mg total) by mouth at bedtime.  30 tablet  0    Objective: BP 134/73  Pulse 109  Temp(Src) 98.7 F (37.1 C) (Oral)  Resp 16  Ht 5\' 4"  (1.626 m)  Wt 181 lb 14.4 oz (82.509 kg)  BMI 31.21 kg/m2  LMP 06/05/2013 Exam: General: sitting in chair looking out the window in no distress whatsoever when entering room, patient becomes tearful when discussing her history  HEENT: mildly dry mucus membranes, pupils nearly 10mm dilated but reactive and equal  Cardiovascular: slightly tachycardic, 2/6 SEM best heard RUSB worsened with lying flat Respiratory: CTAB Abdomen: protuberant abdomen but morbidly obese. Liver noted 4 fingerbreadth's below costal margin, diffusely tender to palpation but  localizes pain to left of umbilicus primarily. Patient does exhibit some voluntary guarding. No rebound. No pain with motion of the bed or walking. Abdominal exam is out of proportion to patient appearance.  Extremities: no edema Skin: warm, dry Neuro: grossly normal, moves all extremities, speech not pressured.   Labs and Imaging: CBC BMET   Recent Labs Lab 06/30/13 1050  WBC 5.8  HGB 9.0* (baseline 10)   HCT 30.3*    Recent Labs Lab 06/30/13 1047  NA 137  K 3.8  CL 103  CO2 25  BUN 4*  CREATININE 0.52  GLUCOSE 157*  CALCIUM 8.6     Results for orders placed in visit on 06/30/13 (from the past 24 hour(s))  COMPREHENSIVE METABOLIC PANEL     Status: Abnormal   Collection Time    06/30/13  10:47 AM      Result Value Range   Sodium 137  135 - 145 mEq/L   Potassium 3.8  3.5 - 5.3 mEq/L   Chloride 103  96 - 112 mEq/L   CO2 25  19 - 32 mEq/L   Glucose, Bld 157 (*) 70 - 99 mg/dL   BUN 4 (*) 6 - 23 mg/dL   Creat 6.21  3.08 - 6.57 mg/dL   Total Bilirubin 1.0  0.3 - 1.2 mg/dL   Alkaline Phosphatase 113  39 - 117 U/L   AST 136 (*) 0 - 37 U/L   ALT 40 (*) 0 - 35 U/L   Total Protein 8.1  6.0 - 8.3 g/dL   Albumin 3.6  3.5 - 5.2 g/dL   Calcium 8.6  8.4 - 84.6 mg/dL   Narrative:    Performed at:  [facility]                [address]                [place], [place] [address]  LIPASE     Status: None   Collection Time    06/30/13 10:47 AM      Result Value Range   Lipase 54  0 - 75 U/L   Narrative:    Performed at:  [facility]                [address], [address]                [place], [place] [address]  ETHANOL     Status: None   Collection Time    06/30/13 10:47 AM      Result Value Range   Alcohol, Ethyl (B)   pending 0 - 10 mg/dL   Narrative:    Performed at:  [facility]                [address], [address]                [place], [place] [address]   Pending abdominal ultrasound  Shelva Majestic, MD 06/30/2013, 2:03 PM PGY-3, [facility] Family Medicine FPTS Intern pager: [phone], text pages welcome

## 2013-06-30 NOTE — H&P (Signed)
Attending Addendum  I examined the patient and discussed the assessment and plan with Dr. Durene Cal. I have reviewed the note and agree.  Patient has 10 year history of alcohol abuse to self treat chronic anxiety. No history of alcohol withdrawal seizures. Has sequelae of alcohol abuse including history of hepatitis, pancreatitis, epistaxis, and upper chest teleangiectasia . I agree with CIWA protocol and ABUS to evaluate for ascites. Agree with TSH. Patient has outpatient psychiatry follow up scheduled and maybe safe to undertake a medically supervised withdrawal program.     Dessa Phi, MD FAMILY MEDICINE TEACHING SERVICE

## 2013-06-30 NOTE — Progress Notes (Signed)
Subjective:    Patient ID: Sara [name], female    DOB: 10/01/1976, 37 y.o.   MRN: [id number] Chief Complaint  Patient presents with  . Abdominal Pain    x 2 day  . Nausea  . panick attack    x 1 mth    HPI   For the past 2-3 wks, has been waking multiple times/night - often around 2-3 a.m. - wakes up with extreme fear, panic - her heart is racing - like she was having a nightmare - sometimes does remember her dreams but sometimes was having a nightmare. Since Thursday (4d) she has not been to work due to epigastric pain - h/o pancreatitis in March - treated in the ER with pain med and IV hydration -  and this feels similar - feels very bloated and now pain is keeping her from sleep.    Has been having some nausea - esp in the morning - but no vomiting. Has had normal BM. Appetite decreased.  Has not eaten anything today but did drink some water. Does not remember if she ate anything yesterday but did drink a fair amount of wine.  For many years has been drinking way to much alcohol in effort to treat her mood symptoms which medication really hasn't worked for.  Has never seen a psychiatrist before. Has panic attacks daily which take her family hours to calm her down after.  Has an appointment on Wed with [facility] - with Dr. Tomasa Rand who is a MD so will be able to hopefully rx her medication.  Has never considered treatment for alcoholism before - always thought it would resolve on it's own if her panic/anxiety/depression was more effectively treated.  She occ tries to stop drinking - last time was 2 wks ago - but whenever she does, she feels weak, shaky, low grade fever and then has to start drinking again to feel better. No h/o severe tremor or seizure during alcohol w/d.  She was on the klonopin in the past but was prescribed 1 in the morning and 2 at night but 2 left her feeling hung-over so was taking 1 tab bid but just stopped taking it - her husband felt like it was losing  it's effectiveness.  Doesn't really know why she stopped taking it - but just ran out and didn't come back to the doctor.  When she had the klonopin, she would sometime take it during the nighttime awakenings (after drinking alcohol at night to fall asleep.)  She would often drink alcohol on the same days as she would take the klonopin but not  Has been on various antidepressants in the past as well which didn't seem to help.     Past Medical History  Diagnosis Date  . Alcoholism   . Anxiety   . Depression   . Pneumonia   . GERD (gastroesophageal reflux disease)    Current Outpatient Prescriptions on File Prior to Visit  Medication Sig Dispense Refill  . cetirizine (ZYRTEC) 10 MG tablet Take 1 tablet (10 mg total) by mouth daily.  30 tablet  0  . etonogestrel-ethinyl estradiol (NUVARING) 0.12-0.015 MG/24HR vaginal ring Insert vaginally and leave in place for 3 consecutive weeks, then remove for 1 week.  1 each  12  . Multiple Vitamin (MULTIVITAMIN WITH MINERALS) TABS Take 1 tablet by mouth daily.      . ranitidine (ZANTAC) 300 MG tablet Take 1 tablet (300 mg total) by mouth at bedtime.  30  tablet  0  . medroxyPROGESTERone (PROVERA) 10 MG tablet Take 2 tablets (20 mg total) by mouth 3 (three) times daily.  42 tablet  0   No current facility-administered medications on file prior to visit.   Allergies  Allergen Reactions  . Morphine And Related     Slowed HR, lowered BP     Review of Systems  Constitutional: Positive for fever, activity change, appetite change and fatigue. Negative for chills, diaphoresis and unexpected weight change.  HENT: Positive for nosebleeds.   Respiratory: Negative for shortness of breath.   Cardiovascular: Positive for palpitations. Negative for chest pain and leg swelling.  Gastrointestinal: Positive for nausea, abdominal pain and abdominal distention. Negative for vomiting, diarrhea, constipation, blood in stool and anal bleeding.  Genitourinary: Negative  for dysuria, decreased urine volume and difficulty urinating.  Musculoskeletal: Negative for gait problem.  Skin: Negative for rash.  Neurological: Positive for tremors, weakness and light-headedness. Negative for seizures, syncope and headaches.  Hematological: Negative for adenopathy.  Psychiatric/Behavioral: Positive for sleep disturbance and dysphoric mood. The patient is nervous/anxious.       BP 112/68  Pulse 106  Temp(Src) 98 F (36.7 C) (Oral)  Resp 16  Ht 5\' 4"  (1.626 m)  Wt 189 lb (85.73 kg)  BMI 32.43 kg/m2  SpO2 96%  LMP 06/05/2013 Objective:   Physical Exam  Constitutional: She is oriented to person, place, and time. She appears well-developed and well-nourished. No distress.  HENT:  Head: Normocephalic and atraumatic.  Neck: Normal range of motion. Neck supple. No thyromegaly present.  Cardiovascular: Regular rhythm and intact distal pulses.  Tachycardia present.   Murmur heard.  Decrescendo systolic murmur is present with a grade of 2/6  Pulmonary/Chest: Effort normal and breath sounds normal. No respiratory distress.  Abdominal: She exhibits distension. Bowel sounds are increased. There is hepatomegaly. There is generalized tenderness. There is CVA tenderness. There is no rebound and no guarding.  Severe abdominal tenderness diffusely but worst in epigastric and RUQ with rebound, guarding, and peritoneal signs  Musculoskeletal: She exhibits no edema.  Lymphadenopathy:    She has no cervical adenopathy.  Neurological: She is alert and oriented to person, place, and time.  Skin: Skin is warm and dry. She is not diaphoretic. No erythema.  Psychiatric: She has a normal mood and affect. Her behavior is normal.      Results for orders placed in visit on 06/30/13  POCT CBC      Result Value Range   WBC 5.8  4.6 - 10.2 K/uL   Lymph, poc 1.7  0.6 - 3.4   POC LYMPH PERCENT 28.9  10 - 50 %L   MID (cbc) 0.4  0 - 0.9   POC MID % 6.4  0 - 12 %M   POC Granulocyte 3.8  2  - 6.9   Granulocyte percent 64.7  37 - 80 %G   RBC 3.04 (*) 4.04 - 5.48 M/uL   Hemoglobin 9.0 (*) 12.2 - 16.2 g/dL   HCT, POC 19.1 (*) 47.8 - 47.9 %   MCV 99.7 (*) 80 - 97 fL   MCH, POC 29.6  27 - 31.2 pg   MCHC 29.7 (*) 31.8 - 35.4 g/dL   RDW, POC 29.5     Platelet Count, POC 115 (*) 142 - 424 K/uL   MPV 8.4  0 - 99.8 fL    Assessment & Plan:  Pancreatitis, acute - Plan: POCT CBC, Comprehensive metabolic panel, Lipase, Ethanol - Unknown severity -  pt states pain much less then when treated for pancreatitis as an o/p in March - however, pain on exam much worse than reported sxs so rec direct inpt admission for serial labs and exams, and npo with IV hydration and pain medicine. Consider imaging.  Also very concerned that if pt is sent home she will continue to drink alcohol as in the past reports she was only able to stop when she had pain medicine for her pancreatitis - has not stopped in the past when prescribed benzos - just not used at the same time.  Will order stat labs now in hopes that they will be available upon pt arrival for hosp admission.  Anxiety state, unspecified  Panic attack - Suspect insomnia with awakenings is due to more alcohol withdrawal than panic. Hopefully is she is treated for alcohol w/d this should cover her panic/anxiety sxs as well.  Alcohol abuse - Plan: Ethanol - needs w/d protocol likely.  Consider transition to inpt behavioral health once stable for treatment of long-standing continuing alcohol abuse in the setting of known cirrhosis   Cirrhosis with alcoholism with portal HTN - Plan: Comprehensive metabolic panel, Lipase, Ethanol - Pt also with signs of coagulopathy due to thrombocytopenia, liver disease, and freq nose bleeds - likely needs coag studies in hosp  Abdominal pain, epigastric - Plan: POCT CBC, Comprehensive metabolic panel, Lipase  Undiagnosed cardiac murmurs - could be due to anemia and dehydration but if does not resolve after IVF consider  echo to look for alcoholic cardiomyopathy.  Case discussed with Dr. Verne Spurr who arranged direct admission for pt to [facility] on [address] - Admitting at the [facility] became pre-syncopal followed by dry-heaves after blood draw - very tremulous. HR 110, BP 114/70.  Drank a cup of water with 4mg  SL zofran with some relief.

## 2013-06-30 NOTE — Patient Instructions (Addendum)
We have arranged direct admission to [facility] through Dr. Annice Pih on the [facility].  Go to the [facility] for a room on [address] - try to be there about noon.

## 2013-07-01 DIAGNOSIS — D649 Anemia, unspecified: Secondary | ICD-10-CM | POA: Diagnosis present

## 2013-07-01 DIAGNOSIS — R109 Unspecified abdominal pain: Secondary | ICD-10-CM | POA: Diagnosis present

## 2013-07-01 LAB — COMPREHENSIVE METABOLIC PANEL
ALT: 32 U/L (ref 0–35)
AST: 107 U/L — ABNORMAL HIGH (ref 0–37)
CO2: 24 mEq/L (ref 19–32)
Calcium: 8.8 mg/dL (ref 8.4–10.5)
GFR calc non Af Amer: >90 mL/min (ref >90)
Sodium: 136 mEq/L (ref 135–145)

## 2013-07-01 LAB — CBC
HCT: 25.5 % — ABNORMAL LOW (ref 36.0–46.0)
Hemoglobin: 7.9 g/dL — ABNORMAL LOW (ref 12.0–15.0)
MCH: 30.4 pg (ref 26.0–34.0)
MCH: 31.2 pg (ref 26.0–34.0)
MCHC: 31 g/dL (ref 30.0–36.0)
MCHC: 32.3 g/dL (ref 30.0–36.0)
MCV: 98.1 fL (ref 78.0–100.0)
MCV: 96.6 fL (ref 78.0–100.0)
Platelets: 71 10*3/uL — ABNORMAL LOW (ref 150–400)
RDW: 16 % — ABNORMAL HIGH (ref 11.5–15.5)

## 2013-07-01 LAB — FERRITIN: Ferritin: 64 ng/mL (ref 10–291)

## 2013-07-01 LAB — RETICULOCYTES
Retic Count, Absolute: 89.4 10*3/uL (ref 19.0–186.0)
Retic Ct Pct: 3.4 % — ABNORMAL HIGH (ref 0.4–3.1)

## 2013-07-01 NOTE — Progress Notes (Signed)
Family Medicine Teaching Service Daily Progress Note Intern Pager: [phone]  Patient name: Sara [name] Medical record number: [id number] Date of birth: [birth year] Age: 37 y.o. Gender: female  Primary Care Provider: Tonye Pearson, MD Consultants: none  Code Status: Full   Pt Overview and Major Events to Date:  8/10 -  8/11 -  Assessment and Plan: Sara [name] is a 37 y.o. year old female presenting with alcohol abuse, anxiety, and abdominal pain . PMH is significant for alcohol abuse, cirrhosis, history of alcoholic pancreatitis in March 2013  #Abdominal Pain in patient with known alcoholic cirrhosis (portal hypertension also previously noted). Unclear etiology. Lipase not elevated. Known cirrhotic. Evaluate as below. Patient appears clinically well but exhibits guarding on exam. No rebound. Afebrile. No WBC elevation, confusion, fever to suggest SBP. No urinary symptoms to suggest UTI. S/p cholecystectomy. No vomiting and regular BM so doubt SBO. No KUB at this time. Diffuse pain unlikely to be GERD.  - consider mechanical soft diet afterwards  -PPI IV for GERD   -as well appearing, hold off on pain medicine for now  -consider therapeutic paracentesis (although would prefer medical management)  [ ]  FOBT. But no hematochezia, melena, BRBPR to suggest esophageal varices.  -needs outpatient f/u with PCP regarding cirrhosis including appropriate vaccinations and HCC monitoring. Patient appears to have had EGD before but will likely need screening again in future for development of varices.  -transaminases appear stable from previous/slightly lower.    #Alcohol abuse/Depression/anxiety  -CIWA protocol with 1x ativan to start  [ ] f/u SW consult for alchol detox (states doesn't want to go to behavioral health and has appt on Wednesday with pscyh so possible outpatient treatment)  -will need additional support for anxiety through psychiatry regardless in order to be successful   -could consider medication such as librium as bridge  -thiamine, folate, banana bag (100 ml/hr for now)  [ ]  f/u alcohol level.   # Tachycardia/previous palpitations  -TSH as above  -appears mildly dehdyrate-banana bag x 1 for now at 100 cc/hr   #Anemia  -monitor daily CBC  -FOBT as above  -seen 05/31/13 by ob/gyn for heavy menstrual bleeding-most likely etiology  -check pt/inr   FEN/GI: NPO for now, mechanical soft if no ascites. Hydrate at 100 ml/hr for now though with admission that if has ascites, may need to stop fluid and start diuresis  Prophylaxis: SCDs due to anemia   Disposition: admit to inpatient . Pending further workup   Subjective: Patient walking around in room. Had no events overnight. She thinks the ativan is helping with her anxiety. She didn't get much sleep last night. She doesn't like to discuss her feelings but is willing to open up today. She wants to stop drinking as it is adversely affecting her health and her social life. She feels an increased anxiety state for the last month. She doesn't attribute it to anything acute.   Objective: Temp:  [98.3 F (36.8 C)-98.7 F (37.1 C)] 98.3 F (36.8 C) (08/11 0615) Pulse Rate:  [101-109] 102 (08/11 0615) Resp:  [16-18] 18 (08/11 0615) BP: (116-134)/(64-73) 119/72 mmHg (08/11 0615) SpO2:  [96 %-97 %] 96 % (08/11 0615) Weight:  [181 lb 14.4 oz (82.509 kg)] 181 lb 14.4 oz (82.509 kg) (08/10 1313) Physical Exam: General: NAD, walking around in room, obese female  Cardiovascular: slightly tachycardic, 2/6 SEM  Respiratory: CTAB Abdomen: protuberant abdomen but morbidly obese. Liver noted 4 fingerbreadth's below costal margin, No rebound. No pain with  motion of the bed or walking. No pain on palpation. Soft, +BS   Extremities: no edema Neuro: grossly normal, moves all extremities, speech not pressured.   Laboratory:  Recent Labs Lab 06/30/13 1050 07/01/13 0643  WBC 5.8 4.1  HGB 9.0* 7.9*  HCT 30.3* 25.5*   PLT  --  80*    Recent Labs Lab 06/30/13 1047 07/01/13 0643  NA 137 136  K 3.8 3.5  CL 103 101  CO2 25 24  BUN 4* 6  CREATININE 0.52 0.48*  CALCIUM 8.6 8.8  PROT 8.1 7.3  BILITOT 1.0 2.0*  ALKPHOS 113 107  ALT 40* 32  AST 136* 107*  GLUCOSE 157* 125*   Lipase     Component Value Date/Time   LIPASE 54 06/30/2013 1047   Preg: negative UDS: negative  TSH: 3.704 nml INR: 1.33 nml Prothrombine 16.2 (15.0 normal)   GC/chlamydia: negative   Imaging/Diagnostic Tests: EKG:   Ab u/s: Comparison: 02/16/2013 CT IMPRESSION: No new acute finding. Echogenic enlarged liver suggestive of fatty liver with possible superimposed cirrhosis as per clinical history.  Clare Gandy, MD 07/01/2013, 9:49 AM PGY-1,  Family Medicine FPTS Intern pager: [phone], text pages welcome

## 2013-07-01 NOTE — Progress Notes (Signed)
I discussed with  Dr Schmitz.  I agree with their plans documented in their progress note for today. 

## 2013-07-02 DIAGNOSIS — K219 Gastro-esophageal reflux disease without esophagitis: Secondary | ICD-10-CM

## 2013-07-02 DIAGNOSIS — D649 Anemia, unspecified: Secondary | ICD-10-CM

## 2013-07-02 DIAGNOSIS — R109 Unspecified abdominal pain: Secondary | ICD-10-CM

## 2013-07-02 LAB — OCCULT BLOOD X 1 CARD TO LAB, STOOL: Fecal Occult Bld: NEGATIVE

## 2013-07-02 LAB — CBC
Hemoglobin: 8.9 g/dL — ABNORMAL LOW (ref 12.0–15.0)
MCH: 30.3 pg (ref 26.0–34.0)
MCHC: 31.2 g/dL (ref 30.0–36.0)

## 2013-07-02 MED ORDER — LORAZEPAM 1 MG PO TABS: 1.0000 mg | ORAL_TABLET | ORAL | Status: DC | PRN

## 2013-07-02 MED ORDER — PANTOPRAZOLE SODIUM 40 MG PO TBEC: 40.0000 mg | DELAYED_RELEASE_TABLET | Freq: Every day | ORAL | Status: DC

## 2013-07-02 MED ORDER — PANTOPRAZOLE SODIUM 40 MG PO TBEC
40.0000 mg | DELAYED_RELEASE_TABLET | Freq: Every day | ORAL | Status: DC
  Administered 2013-07-02: 40 mg via ORAL
  Filled 2013-07-02: qty 1

## 2013-07-02 MED ORDER — SENNOSIDES-DOCUSATE SODIUM 8.6-50 MG PO TABS
2.0000 | ORAL_TABLET | Freq: Once | ORAL | Status: AC
  Administered 2013-07-02: 2 via ORAL
  Filled 2013-07-02: qty 2

## 2013-07-02 MED ORDER — LORAZEPAM 1 MG PO TABS: 1.0000 mg | ORAL_TABLET | Freq: Three times a day (TID) | ORAL | Status: DC | PRN

## 2013-07-02 MED ORDER — SENNOSIDES-DOCUSATE SODIUM 8.6-50 MG PO TABS: 2.0000 | ORAL_TABLET | Freq: Every evening | ORAL | Status: DC | PRN

## 2013-07-02 NOTE — Progress Notes (Signed)
Family Medicine Teaching Service Daily Progress Note Intern Pager: [phone]  Patient name: Sara [name] Medical record number: [id number] Date of birth: 02-13-1976 Age: 37 y.o. Gender: female  Primary Care Provider: Tonye Pearson, MD Consultants: none  Code Status: Full   Pt Overview and Major Events to Date:    Assessment and Plan: Sara [name] is a 37 y.o. year old female presenting with alcohol abuse, anxiety, and abdominal pain . PMH is significant for alcohol abuse, cirrhosis, history of alcoholic pancreatitis in March 2013  #Abdominal Pain in patient with known alcoholic cirrhosis (portal hypertension also previously noted). Unclear etiology. Lipase not elevated. Known cirrhotic. Evaluate as below. Patient appears clinically well but exhibits guarding on exam. No rebound. Afebrile. No WBC elevation, confusion, fever to suggest SBP. No urinary symptoms to suggest UTI. S/p cholecystectomy. No vomiting and regular BM so doubt SBO. No KUB at this time. Diffuse pain unlikely to be GERD.  - consider mechanical soft diet afterwards  -PPI IV for GERD, will d/c with prescription  -as well appearing, hold off on pain medicine for now  -consider therapeutic paracentesis (although would prefer medical management)  [ ]  FOBT. But no hematochezia, melena, BRBPR to suggest esophageal varices.  -needs outpatient f/u with PCP regarding cirrhosis including appropriate vaccinations and HCC monitoring. Patient appears to have had EGD before but will likely need screening again in future for development of varices.  -transaminases appear stable from previous/slightly lower.    #Alcohol abuse/Depression/anxiety  -CIWA protocol with 1x ativan to start  [ ] f/u SW consult for alchol detox (states doesn't want to go to behavioral health and has appt on Wednesday with pscyh so possible outpatient treatment)  -will need additional support for anxiety through psychiatry regardless in order to be  successful  -could consider medication such as librium as bridge -thiamine, folate, banana bag (100 ml/hr for now)  [ ]  f/u alcohol level   # Tachycardia/previous palpitations - most likely caused by anxiety -TSH as above  -appears mildly dehdyrate-banana bag x 1 for now at 100 cc/hr   #Anemia, normocytic, normochromic. Hb: 8.9 and stable. Iron Panel (8/12): Iron: 42, TIBC:445, Ferritin: 64, Folate >20, B-12: 776. Retics high, which suggests possible bleeding cause. -monitor daily CBC  -FOBT (8/12): negative  -seen 05/31/13 by ob/gyn for heavy menstrual bleeding - INR WNL [ ]  set up outpatient follow-up with GI for possible bleed  FEN/GI: full liquid Prophylaxis: SCDs due to anemia   Disposition: pending CSW visit for possible options for continued detox outpatient  Subjective: No problems overnight. Has slight anxiety and irritability but otherwise no hallucinations, some nausea but no vomiting, no chest pain or shortness of breath.  Objective: Temp:  [98.3 F (36.8 C)-98.8 F (37.1 C)] 98.8 F (37.1 C) (08/12 1333) Pulse Rate:  [97-102] 102 (08/12 1333) Resp:  [18-20] 20 (08/12 1333) BP: (96-111)/(49-65) 96/49 mmHg (08/12 1333) SpO2:  [96 %-97 %] 97 % (08/12 1333)  Physical Exam: General: sitting in chair, in no acute distress Cardiovascular: tachycardic with regular rhythm Respiratory: CTAB, no wheezes Abdomen: obese, soft, non-tender   Extremities: no edema Neuro: grossly normal, moves all extremities, speech not pressured   Laboratory:  Recent Labs Lab 07/01/13 0643 07/01/13 1509 07/02/13 0912  WBC 4.1 4.1 4.6  HGB 7.9* 8.2* 8.9*  HCT 25.5* 25.4* 28.5*  PLT 80* 71* 81*    Recent Labs Lab 06/30/13 1047 07/01/13 0643  NA 137 136  K 3.8 3.5  CL 103 101  CO2 25 24  BUN 4* 6  CREATININE 0.52 0.48*  CALCIUM 8.6 8.8  PROT 8.1 7.3  BILITOT 1.0 2.0*  ALKPHOS 113 107  ALT 40* 32  AST 136* 107*  GLUCOSE 157* 125*   Lipase     Component Value  Date/Time   LIPASE 54 06/30/2013 1047   Preg: negative UDS: negative  TSH: 3.704 nml INR: 1.33 nml Prothrombine 16.2 (15.0 normal)   GC/chlamydia: negative   Imaging/Diagnostic Tests: EKG:   Ab u/s: Comparison: 02/16/2013 CT IMPRESSION: No new acute finding. Echogenic enlarged liver suggestive of fatty liver with possible superimposed cirrhosis as per clinical history.  Sara Hawking, MD 07/02/2013, 2:26 PM PGY-1, [facility] Family Medicine FPTS Intern pager: [phone], text pages welcome

## 2013-07-02 NOTE — Progress Notes (Signed)
I discussed with  Dr Nettey.  I agree with their plans documented in their progress note for today.  

## 2013-07-02 NOTE — Progress Notes (Signed)
Discharge instructions reviewed with patient and significant. Questions answered [id number] and timing of med administration. Verbalizes understanding. Reminded to call M.D. Offices in am to schedule f/u appointment. Prescription and printed AVS given to patient.

## 2013-07-02 NOTE — Clinical Social Work Note (Signed)
Clinical Social Work Department BRIEF PSYCHOSOCIAL ASSESSMENT 07/02/2013  Patient:  Sara [name], Sara [name]     Account Number:  [id number]     Admit date:  06/30/2013  Clinical Social Worker:  Hulan Fray  Date/Time:  07/02/2013 04:40 PM  Referred by:  Physician  Date Referred:  06/30/2013 Referred for  Substance Abuse   Other Referral:   Interview type:  Patient Other interview type:    PSYCHOSOCIAL DATA Living Status:  HUSBAND Admitted from facility:   Level of care:   Primary support name:  Sara [name] Primary support relationship to patient:  SPOUSE Degree of support available:   supportive    CURRENT CONCERNS Current Concerns  Substance Abuse   Other Concerns:    SOCIAL WORK ASSESSMENT / PLAN Clinical Social Worker received referral for patient needing outpatient substance abuse treatment facilities. CSW introduced self and explained reason for visit. Patient reported that she was interested in receiving resources. CSW provided list of substance abuse treatment facilities both inpatient and outpatient. Patient reported no further concerns.   Assessment/plan status:  No Further Intervention Required Other assessment/ plan:   Information/referral to community resources:   Substance abuse treatment facility list    PATIENT'S/FAMILY'S RESPONSE TO PLAN OF CARE: Patient was agreeable to receiving resource for inpatient and outpatient substance abuse treatment facilities. Patient was appreciative of resource given.

## 2013-07-03 NOTE — Discharge Summary (Signed)
Family Medicine Teaching [facility] Discharge Summary  Patient name: Sara [name] Medical record number: [id number] Date of birth: 08-Aug-1976 Age: 37 y.o. Gender: female Date of Admission: 06/30/2013  Date of Discharge: 07/02/13 Admitting Physician: Sanjuana Letters, MD  Primary Care Provider: Tonye Pearson, MD Consultants: none  Indication for Hospitalization:abdominal pain   Discharge Diagnoses/Problem List:  Abdominal pain  Alcoholic cirrhosis  Alcohol abuse  Depression  Anxiety  Anemia   Disposition: home   Discharge Condition: stable   Brief Hospital Course:  Sara [name] is a 37 y.o. year old female presenting with alcohol abuse, anxiety, and abdominal pain . PMH is significant for alcohol abuse, cirrhosis, history of alcoholic pancreatitis in March 2013   #Abdominal Pain in patient with known alcoholic cirrhosis (portal hypertension also previously noted). Unclear etiology. Lipase not elevated. Known cirrhotic. Evaluate as below. Patient appears clinically well but exhibits guarding on exam. No rebound. Afebrile. No WBC elevation, confusion, fever to suggest SBP. No urinary symptoms to suggest UTI. S/p cholecystectomy. No vomiting and regular BM so doubt SBO. No KUB at this time. Diffuse pain unlikely to be GERD.  -d/c with pantoprazole -->GERD  - But no hematochezia, melena, BRBPR to suggest esophageal varices.  -needs outpatient f/u with PCP regarding cirrhosis including appropriate vaccinations and HCC monitoring. Patient appears to have had EGD before but will likely need screening again in future for development of varices.  -transaminases appear stable from previous/slightly lower.   #Alcohol abuse/Depression/anxiety  - CSW provided list of substance abuse treatment facilities both inpatient and outpatient land has appt on Wednesday (8/13) with pscyh so possible outpatient treatment)  -will need additional support for anxiety through psychiatry  regardless in order to be successful   # Tachycardia/previous palpitations - most likely caused by anxiety  -TSH: 3.704  #Anemia, normocytic, normochromic. Hb: 8.9 and stable. Iron Panel (8/12): Iron: 42, TIBC:445, Ferritin: 64, Folate >20, B-12: 776. Retics high, which suggests possible bleeding cause.  -FOBT (8/12): negative  -seen 05/31/13 by ob/gyn for heavy menstrual bleeding  - INR WNL   Issues for Follow Up:  1. Cirrhosis: needs f/u for appropriate vaccinations and HCC monitoring. Reestablish with GI, likely needs screening again for development of varices 2. Alcohol abuse/Depression/Alnxiety: scheduled for appt on Wednesday, August 13 with Psych  3. Anemia: stable at d/c, previously seen by OB/GYN for heavy bleeding.   Significant Procedures:  Significant Labs and Imaging:   Recent Labs Lab 07/01/13 0643 07/01/13 1509 07/02/13 0912  WBC 4.1 4.1 4.6  HGB 7.9* 8.2* 8.9*  HCT 25.5* 25.4* 28.5*  PLT 80* 71* 81*    Recent Labs Lab 06/30/13 1047 07/01/13 0643  NA 137 136  K 3.8 3.5  CL 103 101  CO2 25 24  GLUCOSE 157* 125*  BUN 4* 6  CREATININE 0.52 0.48*  CALCIUM 8.6 8.8  ALKPHOS 113 107  AST 136* 107*  ALT 40* 32  ALBUMIN 3.6 2.8*     Outstanding Results: none   Discharge Medications:    Medication List    STOP taking these medications       cetirizine 10 MG tablet  Commonly known as:  ZYRTEC     ranitidine 300 MG tablet  Commonly known as:  ZANTAC      TAKE these medications       etonogestrel-ethinyl estradiol 0.12-0.015 MG/24HR vaginal ring  Commonly known as:  NUVARING  Insert vaginally and leave in place for 3 consecutive weeks, then remove for  1 week.     LORazepam 1 MG tablet  Commonly known as:  ATIVAN  Take 1 tablet (1 mg total) by mouth every 8 (eight) hours as needed for anxiety.     LORazepam 1 MG tablet  Commonly known as:  ATIVAN  Take 1 tablet (1 mg total) by mouth as needed for anxiety.  Start taking on:  07/04/2013      multivitamin with minerals Tabs tablet  Take 1 tablet by mouth daily.     pantoprazole 40 MG tablet  Commonly known as:  PROTONIX  Take 1 tablet (40 mg total) by mouth daily.        Discharge Instructions: Please refer to Patient Instructions section of EMR for full details.  Patient was counseled important signs and symptoms that should prompt return to medical care, changes in medications, dietary instructions, activity restrictions, and follow up appointments.   Follow-Up Appointments:     Follow-up Information   Follow up with DOOLITTLE, Harrel Lemon, MD. Schedule an appointment as soon as possible for a visit in 1 week. [facility] follow-up)    Specialty:  Pediatrics   Contact information:   [address] [place] [address] [phone]       Follow up with SCHOOLER,VINCENT C., MD. Schedule an appointment as soon as possible for a visit in 1 week. (Possible bleed)    Specialty:  Gastroenterology   Contact information:   [address], [address] Jaynie Crumble [place] [address] [phone]       Clare Gandy, MD 07/03/2013, 10:38 PM PGY-1, [facility]

## 2013-07-04 NOTE — Discharge Summary (Signed)
I discussed with  Dr Jordan Likes.  I agree with their plans documented in their discharge note for today.

## 2013-07-06 LAB — ETHANOL: Alcohol, Ethyl (B): 126 mg/dL — ABNORMAL HIGH (ref 0–10)

## 2013-08-20 ENCOUNTER — Ambulatory Visit (INDEPENDENT_AMBULATORY_CARE_PROVIDER_SITE_OTHER): Payer: BC Managed Care – PPO | Admitting: Emergency Medicine

## 2013-08-20 VITALS — BP 138/74 | HR 98 | Temp 98.3°F | Resp 18 | Ht 63.0 in | Wt 154.6 lb

## 2013-08-20 DIAGNOSIS — F1010 Alcohol abuse, uncomplicated: Secondary | ICD-10-CM

## 2013-08-20 DIAGNOSIS — K219 Gastro-esophageal reflux disease without esophagitis: Secondary | ICD-10-CM

## 2013-08-20 DIAGNOSIS — K7030 Alcoholic cirrhosis of liver without ascites: Secondary | ICD-10-CM

## 2013-08-20 DIAGNOSIS — F411 Generalized anxiety disorder: Secondary | ICD-10-CM

## 2013-08-20 LAB — LIPASE: Lipase: 43 U/L (ref 0–75)

## 2013-08-20 LAB — POCT CBC
Granulocyte percent: 68.6 %G (ref 37–80)
Hemoglobin: 10.1 g/dL — AB (ref 12.2–16.2)
MCH, POC: 28.9 pg (ref 27–31.2)
MPV: 7.7 fL (ref 0–99.8)
POC Granulocyte: 4 (ref 2–6.9)
POC MID %: 7.3 %M (ref 0–12)
RBC: 3.49 M/uL — AB (ref 4.04–5.48)
WBC: 5.8 10*3/uL (ref 4.6–10.2)

## 2013-08-20 LAB — COMPREHENSIVE METABOLIC PANEL
ALT: 33 U/L (ref 0–35)
Albumin: 3.7 g/dL (ref 3.5–5.2)
CO2: 25 mEq/L (ref 19–32)
Calcium: 9.3 mg/dL (ref 8.4–10.5)
Chloride: 97 mEq/L (ref 96–112)
Potassium: 3.8 mEq/L (ref 3.5–5.3)
Sodium: 132 mEq/L — ABNORMAL LOW (ref 135–145)
Total Protein: 8.3 g/dL (ref 6.0–8.3)

## 2013-08-20 LAB — AMYLASE: Amylase: 45 U/L (ref 0–105)

## 2013-08-20 LAB — LIPID PANEL: Cholesterol: 219 mg/dL — ABNORMAL HIGH (ref 0–200)

## 2013-08-20 MED ORDER — ESOMEPRAZOLE MAGNESIUM 40 MG PO CPDR: 40.0000 mg | DELAYED_RELEASE_CAPSULE | Freq: Every day | ORAL | Status: DC

## 2013-08-20 MED ORDER — SUCRALFATE 1 G PO TABS: ORAL_TABLET | ORAL | Status: DC

## 2013-08-20 NOTE — Progress Notes (Addendum)
[facility] [address] [place] [address] [phone]  Date:  08/20/2013   Name:  Sara [name]   DOB:  05-09-1976   MRN:  [id number]  PCP:  Tonye Pearson, MD    Chief Complaint: Sore Throat and Cough   History of Present Illness:  Sara [name] is a 37 y.o. very pleasant female patient who presents with the following:  History of cirrhosis and alcoholism. Is a daily drinker. Concerned that she has reflux and is taking zantac every other day for heartburn.  Has frequent waterbrash.  No hematemesis or melena.  No vomiting blood.  Has sore throat and not productive cough.  No fever or chills.  Food intolerance includes tomato sauce, spices, fried food.  Resists discussion of rehab or AA.  Says that she has severe panic that prevents her sobriety.  Says it is resistant to "every treatment".  Unemployed  No improvement with over the counter medications or other home remedies. Denies other complaint or health concern today.  Husband relates that she has been admitted to the hospital three times recently and refuses to consider AA or other alcohol rehab programs.  Has no motivation to stop drinking.  Patient Active Problem List   Diagnosis Date Noted  . Anemia 07/01/2013  . Abdominal  pain, other specified site 07/01/2013  . Anxiety state, unspecified 06/30/2013  . Panic attack 06/30/2013  . Cirrhosis with alcoholism 06/30/2013  . Alcoholism   . Depression   . GERD (gastroesophageal reflux disease)   . Portal hypertension   . Abnormal uterine bleeding 05/31/2013  . ALCOHOL ABUSE 01/07/2011    Past Medical History  Diagnosis Date  . Alcoholism   . Anxiety   . Depression   . Pneumonia   . GERD (gastroesophageal reflux disease)   . Cirrhosis   . Portal hypertension   . S/P alcohol detoxification     2-3 days at behavioral health previously    Past Surgical History  Procedure Laterality Date  . Cholecystectomy      History  Substance Use  Topics  . Smoking status: Former Games developer  . Smokeless tobacco: Never Used  . Alcohol Use: 0.0 oz/week     Comment: reports 1 bottle of wine and pint of or more of vodka daily. 2 bottles of wine currently and pint of vodka some days for at least 4-5 days.a month.      Family History  Problem Relation Age of Onset  . Colon polyps Mother   . Hypertension Mother   . Thyroid disease Mother   . Alcoholism Mother   . Alcoholism Father     Allergies  Allergen Reactions  . Morphine And Related     Slowed HR, lowered BP    Medication list has been reviewed and updated.  Current Outpatient Prescriptions on File Prior to Visit  Medication Sig Dispense Refill  . etonogestrel-ethinyl estradiol (NUVARING) 0.12-0.015 MG/24HR vaginal ring Insert vaginally and leave in place for 3 consecutive weeks, then remove for 1 week.  1 each  12  . Multiple Vitamin (MULTIVITAMIN WITH MINERALS) TABS Take 1 tablet by mouth daily.      [name] LORazepam (ATIVAN) 1 MG tablet Take 1 tablet (1 mg total) by mouth every 8 (eight) hours as needed for anxiety.  2 tablet  0  . LORazepam (ATIVAN) 1 MG tablet Take 1 tablet (1 mg total) by mouth as needed for anxiety.  1 tablet  0  .  pantoprazole (PROTONIX) 40 MG tablet Take 1 tablet (40 mg total) by mouth daily.  120 tablet  0   No current facility-administered medications on file prior to visit.    Review of Systems:  As per HPI, otherwise negative.    Physical Examination: Filed Vitals:   08/20/13 1242  BP: 138/74  Pulse: 98  Temp: 98.3 F (36.8 C)  Resp: 18   Filed Vitals:   08/20/13 1242  Height: 5\' 3"  (1.6 m)  Weight: 154 lb 9.6 oz (70.126 kg)   Body mass index is 27.39 kg/(m^2). Ideal Body Weight: Weight in (lb) to have BMI = 25: 140.8  GEN: obese, NAD, Non-toxic, A & O x 3 HEENT: Atraumatic, Normocephalic. Neck supple. No masses, No LAD. Ears and Nose: No external deformity. CV: RRR, No M/G/R. No JVD. No thrill. No extra heart sounds. PULM: CTA B,  no wheezes, crackles, rhonchi. No retractions. No resp. distress. No accessory muscle use. ABD: S, NT, ND, +BS. No rebound. Five finger liver. EXTR: No c/c/e NEURO Normal gait.  PSYCH: Normally interactive. Conversant. Not depressed or anxious appearing.  Calm demeanor.    Assessment and Plan: Alcoholic cirrhosis GERD Anxiety Labs AA STOP DRINKING  Signed,  Phillips Odor, MD

## 2013-08-20 NOTE — Patient Instructions (Signed)
Gastroesophageal Reflux Disease, Adult  Gastroesophageal reflux disease (GERD) happens when acid from your stomach flows up into the esophagus. When acid comes in contact with the esophagus, the acid causes soreness (inflammation) in the esophagus. Over time, GERD may create small holes (ulcers) in the lining of the esophagus.  CAUSES   · Increased body weight. This puts pressure on the stomach, making acid rise from the stomach into the esophagus.  · Smoking. This increases acid production in the stomach.  · Drinking alcohol. This causes decreased pressure in the lower esophageal sphincter (valve or ring of muscle between the esophagus and stomach), allowing acid from the stomach into the esophagus.  · Late evening meals and a full stomach. This increases pressure and acid production in the stomach.  · A malformed lower esophageal sphincter.  Sometimes, no cause is found.  SYMPTOMS   · Burning pain in the lower part of the mid-chest behind the breastbone and in the mid-stomach area. This may occur twice a week or more often.  · Trouble swallowing.  · Sore throat.  · Dry cough.  · Asthma-like symptoms including chest tightness, shortness of breath, or wheezing.  DIAGNOSIS   Your caregiver may be able to diagnose GERD based on your symptoms. In some cases, X-rays and other tests may be done to check for complications or to check the condition of your stomach and esophagus.  TREATMENT   Your caregiver may recommend over-the-counter or prescription medicines to help decrease acid production. Ask your caregiver before starting or adding any new medicines.   HOME CARE INSTRUCTIONS   · Change the factors that you can control. Ask your caregiver for guidance concerning weight loss, quitting smoking, and alcohol consumption.  · Avoid foods and drinks that make your symptoms worse, such as:  · Caffeine or alcoholic drinks.  · Chocolate.  · Peppermint or mint flavorings.  · Garlic and onions.  · Spicy foods.  · Citrus fruits,  such as oranges, lemons, or limes.  · Tomato-based foods such as sauce, chili, salsa, and pizza.  · Fried and fatty foods.  · Avoid lying down for the 3 hours prior to your bedtime or prior to taking a nap.  · Eat small, frequent meals instead of large meals.  · Wear loose-fitting clothing. Do not wear anything tight around your waist that causes pressure on your stomach.  · Raise the head of your bed 6 to 8 inches with wood blocks to help you sleep. Extra pillows will not help.  · Only take over-the-counter or prescription medicines for pain, discomfort, or fever as directed by your caregiver.  · Do not take aspirin, ibuprofen, or other nonsteroidal anti-inflammatory drugs (NSAIDs).  SEEK IMMEDIATE MEDICAL CARE IF:   · You have pain in your arms, neck, jaw, teeth, or back.  · Your pain increases or changes in intensity or duration.  · You develop nausea, vomiting, or sweating (diaphoresis).  · You develop shortness of breath, or you faint.  · Your vomit is green, yellow, black, or looks like coffee grounds or blood.  · Your stool is red, bloody, or black.  These symptoms could be signs of other problems, such as heart disease, gastric bleeding, or esophageal bleeding.  MAKE SURE YOU:   · Understand these instructions.  · Will watch your condition.  · Will get help right away if you are not doing well or get worse.  Document Released: 08/17/2005 Document Revised: 01/30/2012 Document Reviewed: 05/27/2011  Exit[employer]® Patient   Information ©2014 [employer].

## 2013-08-21 LAB — H. PYLORI ANTIBODY, IGG: H Pylori IgG: 0.81 {ISR}

## 2013-09-05 ENCOUNTER — Ambulatory Visit: Payer: BC Managed Care – PPO | Admitting: Obstetrics & Gynecology

## 2013-09-10 ENCOUNTER — Ambulatory Visit (HOSPITAL_COMMUNITY)
Admission: RE | Admit: 2013-09-10 | Discharge: 2013-09-10 | Disposition: A | Discharge status: Home / Self Care | Payer: BC Managed Care – PPO | Attending: Psychiatry | Admitting: Psychiatry
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
## 2013-09-11 ENCOUNTER — Encounter (HOSPITAL_COMMUNITY): Payer: Self-pay | Admitting: Psychology

## 2013-09-11 ENCOUNTER — Other Ambulatory Visit (HOSPITAL_COMMUNITY): Payer: BC Managed Care – PPO | Attending: Psychiatry | Admitting: Psychology

## 2013-09-11 DIAGNOSIS — F1010 Alcohol abuse, uncomplicated: Secondary | ICD-10-CM | POA: Insufficient documentation

## 2013-09-11 DIAGNOSIS — F32A Depression, unspecified: Secondary | ICD-10-CM

## 2013-09-11 DIAGNOSIS — F411 Generalized anxiety disorder: Secondary | ICD-10-CM

## 2013-09-11 DIAGNOSIS — F329 Major depressive disorder, single episode, unspecified: Secondary | ICD-10-CM

## 2013-09-11 DIAGNOSIS — F1020 Alcohol dependence, uncomplicated: Secondary | ICD-10-CM

## 2013-09-13 ENCOUNTER — Encounter (HOSPITAL_COMMUNITY): Payer: Self-pay | Admitting: Psychology

## 2013-09-13 ENCOUNTER — Other Ambulatory Visit (HOSPITAL_COMMUNITY): Payer: BC Managed Care – PPO | Admitting: Psychology

## 2013-09-13 DIAGNOSIS — F1020 Alcohol dependence, uncomplicated: Secondary | ICD-10-CM

## 2013-09-13 DIAGNOSIS — F32A Depression, unspecified: Secondary | ICD-10-CM

## 2013-09-13 DIAGNOSIS — F329 Major depressive disorder, single episode, unspecified: Secondary | ICD-10-CM

## 2013-09-13 DIAGNOSIS — F411 Generalized anxiety disorder: Secondary | ICD-10-CM

## 2013-09-13 DIAGNOSIS — K7030 Alcoholic cirrhosis of liver without ascites: Secondary | ICD-10-CM

## 2013-09-13 MED ORDER — SERTRALINE HCL 50 MG PO TABS: 50.0000 mg | ORAL_TABLET | Freq: Every day | ORAL | Status: DC

## 2013-09-13 MED ORDER — TRAZODONE HCL 50 MG PO TABS: 50.0000 mg | ORAL_TABLET | Freq: Every day | ORAL | Status: DC

## 2013-09-13 NOTE — Progress Notes (Signed)
    Daily Group Progress Note  Program: CD-IOP   Group Time: 1-2:30 pm  Participation Level: Minimal  Behavioral Response: Sharing  Type of Therapy: Process Group  Topic: Group Process; The first part of group was spent in process. Members shared about current issues and concerns. Two new group members were present and they shared a little about themselves. There was good disclosure and the new members welcomed by their new group members.  Group Time: 2:45- 4pm  Participation Level: Minimal  Behavioral Response: Sharing  Type of Therapy: Psycho-education Group  Topic: Psycho-Ed/Activity: first part of second half of group was spent discussing 'Denial". Included was a handout on this issue. Members shared reading the handout and provided examples of how they had sought or fell back into denial with families, friends, or themselves. In the second part of this session, members participated in "Family Sculpture". One member 'sculpted' her family and it proved very revealing and painful. Her Genogram was also drawn on the board and both she and her husband have the genetic predisposition on both sides. The session proved effective   The session proved effective and provided the group with a view of this member's life previously unseen or examined.   Summary: The patient was new to the group today. She checked-in, but shared little else. The other new group member noted they had worked together in their previous employment, but neither works there now or intends to return later. The patient shared a little more after being invited to tell the group about herself. She admitted she had been drinking for quite a long time, but it had increased and she had finally been compelled to do something about it. The patient admitted she had been diagnosed with cirrhosis and had pancreatitis. It had frightened her and her family. She seemed more than a little worried about how she would stop drinking after  having drank for so long. They group provided good feedback and I questioned her previous attempts at stopping and whether she had experienced withdrawal. The patient made some good comments in the family sculpture. Her sobriety date is today, 10/22. She was encouraged to attend the women's meeting tomorrow morning at the [facility].   Family Program: Family present? No   Name of family member(s):   UDS collected: No Results:   AA/NA attended?: No , patient new to the group  Sponsor?: No   Damiyah Ditmars, LCAS

## 2013-09-13 NOTE — Progress Notes (Unsigned)
Patient ID: Sara [name], female   DOB: 17-Dec-1975, 37 y.o.   MRN: [id number] Orientation to CD-IOP: The patient is a 37 yo married, Hispanic, female seeking treatment to address her alcohol dependence. The patient lives with her husband and two children in [place]. The patient reported a long history of alcohol use that first began in her teens. She has been diagnosed with Cirrhosis and reported she has been treated for pancreatitis in the past. The patient reported she has been instructed to schedule an appointment with a gastroenterologist, but had not yet done so. Despite her health complications, the patient has continued to drink. She reported drinking approximately 2 bottles of wine per day. The patient reported she had recently tried to stop drinking on her own and had reached 14 days of sobriety before relapsing. She insisted she had not experienced any withdrawal symptoms that were noteworthy. The patient admitted that her family is worried about her. Her husband and children, as well as her parents and siblings, of which 2 live in the area, are also very concerned about her drinking and health issues.  The patient reported she drinks to address her anxiety. In addition to anxiety, she also struggles with depression. The patient was [place] but spent most of her childhood in [place]. She reported she received treatment for her alcohol use when she was 37 yo. She has addiction on both sides of her family, including a paternal grandfather, uncle and aunt, along with her maternal grandfather. 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 She reported she recently quit her job and does not intend to return. Despite previous medications to address her depression and anxiety, the patient is currently taking only Nexium. The documentation was reviewed and the orientation completed accordingly. She will return later today and begin the  CD-IOP.

## 2013-09-16 ENCOUNTER — Encounter (HOSPITAL_COMMUNITY): Payer: Self-pay | Admitting: Psychology

## 2013-09-16 ENCOUNTER — Other Ambulatory Visit (HOSPITAL_COMMUNITY): Payer: BC Managed Care – PPO

## 2013-09-17 ENCOUNTER — Encounter (HOSPITAL_COMMUNITY): Payer: Self-pay | Admitting: Psychology

## 2013-09-17 ENCOUNTER — Telehealth (HOSPITAL_COMMUNITY): Payer: Self-pay | Admitting: Psychology

## 2013-09-17 NOTE — Progress Notes (Signed)
    Daily Group Progress Note  Program: CD-IOP   Group Time: 1-2:30 pm  Participation Level: Minimal  Behavioral Response: Sharing  Type of Therapy: Process Group  Topic:  Counselor opened the group session by having group members introduce themselves and share their sobriety dates.  Group members discussed a reading on having faith in their own recovery process.  They were then invited to share and several group members discussed relationship issues, specifically significant others who were not supportive or did not understand recovery.  Two new group members also shared about their personal histories.  The group helped one member process a recent relapse, which she disclosed on her daily inventory, and encouraged her to have a friend help her remove all remaining drugs from her home.  The session included good feedback and support among the group.   Group Time: 2:45- 4pm  Participation Level: Minimal  Behavioral Response: Resistant and closed  Type of Therapy: Psycho-education Group/Graduation  Topic: Counselor provided materials on family roles and dysfunctional families.  Group members shared about what their roles were growing up, and how those roles had changed in adulthood.  Several group members shared their family sculptures, in which they directed other group members to pose as their family members in a way that represented their relationships.  Counselors and group members offered reflections and interpretations of the family sculptures. At the end of the session, counselor led a graduation ceremony for a group member who has completed the program.  Summary: Patient reported a sobriety date of 10/22.  Counselor affirmed that she had remained sober since her orientation session, which patient had previously indicated would be difficult for her. She nodded and reported she had not drunk anything.  During the process group discussion on faith, she shared that caring for her  children gives her hope for herself.  During the psychoeducation group, she participated in several other members' family sculptures.  She presented as very reserved during the group session and reported that she felt anxious.   Family Program: Family present? No   Name of family member(s):   UDS collected: Yes Results: positive for alcohol  AA/NA attended?: No , but she is new to the group  Sponsor?: No   Lanisha Stepanian, LCAS

## 2013-09-17 NOTE — Progress Notes (Unsigned)
Patient ID: Sara [name], female   DOB: 07/17/1976, 37 y.o.   MRN: [id number] CD-IOP: Treatment Plan. I met with this patient this morning for our first individual session. I explained the need to identify her goals for treatment in the program. She was very cooperative during the session. When asked about the goal of continued sobriety, the patient was in agreement. She admitted she had drunk yesterday and previous to that, on Friday morning before group. The patient described the events that led up to her drinking yesterday. Despite her husband, 19 yo daughter, and 29 yo son being in the house the entire time, she still drank 2 bottles of wine. As she talked, it sounded as if the family is very distant and there is little to no real communication or disclosures among family members. She had not attended any AA meetings since beginning the program. We discussed the importance of changing old patterns and routines. She admitted it was 'afraid' of change, but I noted that if she continues on this path, she will eventually die a miserable death from cirrhosis. Support for her sobriety was also recognized as a treatment goal. The patient identified addressing her 'anxiety' and learning how to live a healthy daily life that allows her to keep her anxiety reduced or absent was also a goal she identified. The patient seemed very uncertain whether she could put any small changes into her daily life and it was confusing that she seemed to 'paralyzed' to institute change. I also emphasized the importance of her family becoming involved and supportive of her recovery and the patient seemed surprised by this. The documentation was completed and the patient stated she would return this afternoon for the group session.

## 2013-09-18 ENCOUNTER — Other Ambulatory Visit (HOSPITAL_COMMUNITY): Payer: BC Managed Care – PPO | Admitting: Psychology

## 2013-09-20 ENCOUNTER — Other Ambulatory Visit (HOSPITAL_COMMUNITY): Payer: BC Managed Care – PPO | Admitting: Psychology

## 2013-09-20 DIAGNOSIS — F411 Generalized anxiety disorder: Secondary | ICD-10-CM

## 2013-09-20 DIAGNOSIS — K7030 Alcoholic cirrhosis of liver without ascites: Secondary | ICD-10-CM

## 2013-09-20 DIAGNOSIS — F1020 Alcohol dependence, uncomplicated: Secondary | ICD-10-CM

## 2013-09-20 DIAGNOSIS — F329 Major depressive disorder, single episode, unspecified: Secondary | ICD-10-CM

## 2013-09-20 DIAGNOSIS — F32A Depression, unspecified: Secondary | ICD-10-CM

## 2013-09-20 MED ORDER — NALTREXONE HCL 50 MG PO TABS: 50.0000 mg | ORAL_TABLET | Freq: Every day | ORAL | Status: DC

## 2013-09-20 NOTE — Progress Notes (Signed)
Patient ID: [name], female   DOB: May 04, 1976, 37 y.o.   MRN: [id number]  Subjective: I met with Sara [name] to assess her progress. She reports that she feels less depressed than she was a week ago. She has noticed that she feels more motivated and less sad than she was. Although she continues to have some anxiety, she reports that it also is improving. She is sleeping better, and states that she goes to sleep around 10 PM and gets up around 5:30 AM. She does report that his sleep is somewhat broken, but does not want to increase the dose of trazodone as she needs to be over to wake up early and the trazodone already makes her feel somewhat groggy through the day. She reports that she has been sober since 1027, and she is attending meetings. She reports that she can't L. that the fall he is lifting somewhat. She does endorse frequent cravings for alcohol, and is open to the idea of taking an anti-craving medication.  Objective: Well-nourished well-developed Hispanic female in no acute distress, fully alert and oriented, good eye contact, mildly anxious with a congruent affect, cognitive functioning is within normal limits, memory and concentration are intact, speech is clear and coherent.  Assessment and Plan: Anxiety and depression improving. Patient experiencing cravings for alcohol.  Will initiate naltrexone 50 mg each morning for cravings. We'll give Zoloft more time to become effective, but if in 2 more weeks depression and anxiety have not improved significantly will increase Zoloft to 100 mg. Encourage patient to continue attending 12 step meetings and build her support network.

## 2013-09-21 ENCOUNTER — Encounter (HOSPITAL_COMMUNITY): Payer: Self-pay | Admitting: Psychology

## 2013-09-21 NOTE — Progress Notes (Signed)
    Daily Group Progress Note  Program: CD-IOP   Group Time: 1-2:30 pm  Participation Level: Active  Behavioral Response: Appropriate and Sharing  Type of Therapy: Activity Group  Topic:Mindfulness: first part of group was spent in an activity practicing mindfulness. Saltine crackers were provided to each group member and they were instructed to eat the cracker as slowly as possible and watch themselves and experience the taste, texture and entire cracker. Most members admitted they eat really fast - essentially, inhaling their food. Later in this session, the group practiced mindfulness with a walk around the [place]. Again, members admitted they felt awkward walking so slowly, but agreed that by walking so much slower than usual, they really "saw the trees" in their rich fall colors and were more aware of the sound of the wind blowing through the branches and leaves.    Group Time: 2:45- 4pm  Participation Level: Minimal  Behavioral Response: Sharing  Type of Therapy: Psycho-education Group  Topic: Family Sculpture/Graduation: Members were invited to 'sculpt' their families in this second half of group group. Three members engaged in this psycho-ed session and by sharing about their families, they revealed much more about themselves to the rest of the group. The session proved very compelling. It ended with a graduation for a departing member. He thanked the group and admitted he wished he had been more open from the beginning and hared more of himself. He expressed his gratitude and hopes for his fellow group members. Group members responded very positively and almost everyone noted they had seen a big change in him from the first day he entered the program. It was very powerful and heartfelt farewell.    Summary: The patient checked-in with a sobriety date of 10/27. She had not drunk anything since prior to the last group session. I noted she looked much better and more  relaxed. The patient admitted she is feeling much less anxiety and had slept much better last night. She had also attended the AA meeting this morning and noted she had gotten a lot of phone numbers and had enjoyed the meeting. She was quiet, but attentive in the mindfulness exercises. In the second half of group, the patient served in the family sculptures and made some good comments. She disclosed more today than she has in the past and she is getting more comfortable with the group. I will continue to invite her to share more of her self in the sessions ahead.    Family Program: Family present? No   Name of family member(s):   UDS collected: No Results:  AA/NA attended?: YesWednesday  Sponsor?: No, but she is very new to recovery and will need to attend some more meetings   Jahmez Bily, LCAS

## 2013-09-23 ENCOUNTER — Other Ambulatory Visit (HOSPITAL_COMMUNITY): Payer: BC Managed Care – PPO | Attending: Psychiatry

## 2013-09-24 ENCOUNTER — Encounter (HOSPITAL_COMMUNITY): Payer: Self-pay | Admitting: Psychology

## 2013-09-24 NOTE — Progress Notes (Signed)
    Daily Group Progress Note  Program: CD-IOP   Group Time: 1-2:30 pm  Participation Level: Active  Behavioral Response: Appropriate and Sharing  Type of Therapy: Process Group  Topic: Members introduced themselves and shared their sobriety dates, then shared one thing they had done since the previous group to support their recovery.  Counselor led group members in 5 minutes of Heart Math before starting the process time.  Two new group members and one  more established group member shared their stories about what brought them to the group, as well as what they wanted to gain from being a part of the program.  Other group members connected with them by pointing out things they had in common and providing encouragement.  At the end of group, counselor asked all group members to share their weekend plans to insure that everyone had safe activities to engage in over the holiday weekend.  Group Time: 2:45- 4pm  Participation Level: Minimal  Behavioral Response: Sharing  Type of Therapy: Psycho-education Group  Topic: Counselor provided a handout with information on the progression of relapse and early warning signs, highlighting that the decision to use drugs or drink is the completion of the relapse process, not the beginning.  Group members discussed changes in thoughts/attitudes, feelings/moods, and behaviors that might indicate movement toward relapse, and shared about their personal warning signs.  Group members also completed a "green light/yellow light/red light" activity, in which they discussed behaviors that indicate health and well-being (green light), behaviors that lead directly to relapse (red light), and warning sign behaviors that indicate movement toward relapse (yellow light).  They discussed the importance of recognizing the "yellow light" signs early and moving back toward "green light" behaviors  Summary: Patient reported a sobriety date of 10/27 and shared that she had  attended a women's AA meeting since the last group session.  She shared that she had enjoyed the meeting and got several phone numbers from other women in recovery.  During the process time she shared about her personal history, her drug use history, and what led her to seek treatment.  She emphasized her medical problems and her family's concern about her.  Group members connected with her about their own similar experiences.  During the psychoeducation group she was quiet but attentive, and identified anxiety as a "yellow light" sign. During part of this second session, the patient was out of the room meeting with the medical director. She made some good comments and members noted she looked more relaxed today. The patient responded well to this intervention.    Family Program: Family present? No   Name of family member(s):   UDS collected: No Results:   AA/NA attended?: Yes Wednesday morning  Sponsor?: No, she attended her first meeting today   Sible Straley, LCAS

## 2013-09-25 ENCOUNTER — Other Ambulatory Visit (HOSPITAL_COMMUNITY): Payer: BC Managed Care – PPO

## 2013-09-26 ENCOUNTER — Telehealth (HOSPITAL_COMMUNITY): Payer: Self-pay | Admitting: Psychology

## 2013-09-26 ENCOUNTER — Other Ambulatory Visit: Payer: Self-pay

## 2013-09-27 ENCOUNTER — Inpatient Hospital Stay (HOSPITAL_COMMUNITY)
Admission: EM | Admit: 2013-09-27 | Discharge: 2013-10-03 | DRG: 897 | Disposition: A | Discharge status: Home / Self Care | Payer: BC Managed Care – PPO | Source: Intra-hospital | Attending: Psychiatry | Admitting: Psychiatry

## 2013-09-27 ENCOUNTER — Encounter (HOSPITAL_COMMUNITY): Payer: Self-pay | Admitting: Emergency Medicine

## 2013-09-27 ENCOUNTER — Emergency Department (HOSPITAL_COMMUNITY)
Admission: EM | Admit: 2013-09-27 | Discharge: 2013-09-27 | Disposition: A | Discharge status: Other Acute Inpatient Hospital | Payer: BC Managed Care – PPO | Attending: Emergency Medicine | Admitting: Emergency Medicine

## 2013-09-27 ENCOUNTER — Encounter (HOSPITAL_COMMUNITY): Payer: Self-pay | Admitting: *Deleted

## 2013-09-27 ENCOUNTER — Other Ambulatory Visit (HOSPITAL_COMMUNITY): Payer: BC Managed Care – PPO

## 2013-09-27 DIAGNOSIS — N939 Abnormal uterine and vaginal bleeding, unspecified: Secondary | ICD-10-CM

## 2013-09-27 DIAGNOSIS — F329 Major depressive disorder, single episode, unspecified: Secondary | ICD-10-CM | POA: Diagnosis present

## 2013-09-27 DIAGNOSIS — F39 Unspecified mood [affective] disorder: Secondary | ICD-10-CM | POA: Insufficient documentation

## 2013-09-27 DIAGNOSIS — F10939 Alcohol use, unspecified with withdrawal, unspecified: Principal | ICD-10-CM

## 2013-09-27 DIAGNOSIS — F1020 Alcohol dependence, uncomplicated: Secondary | ICD-10-CM | POA: Insufficient documentation

## 2013-09-27 DIAGNOSIS — F411 Generalized anxiety disorder: Secondary | ICD-10-CM | POA: Insufficient documentation

## 2013-09-27 DIAGNOSIS — F1010 Alcohol abuse, uncomplicated: Secondary | ICD-10-CM

## 2013-09-27 DIAGNOSIS — K7030 Alcoholic cirrhosis of liver without ascites: Secondary | ICD-10-CM

## 2013-09-27 DIAGNOSIS — F1994 Other psychoactive substance use, unspecified with psychoactive substance-induced mood disorder: Secondary | ICD-10-CM

## 2013-09-27 DIAGNOSIS — F3289 Other specified depressive episodes: Secondary | ICD-10-CM | POA: Insufficient documentation

## 2013-09-27 DIAGNOSIS — Z8701 Personal history of pneumonia (recurrent): Secondary | ICD-10-CM | POA: Insufficient documentation

## 2013-09-27 DIAGNOSIS — F32A Depression, unspecified: Secondary | ICD-10-CM

## 2013-09-27 DIAGNOSIS — F410 Panic disorder [episodic paroxysmal anxiety] without agoraphobia: Secondary | ICD-10-CM

## 2013-09-27 DIAGNOSIS — Z87891 Personal history of nicotine dependence: Secondary | ICD-10-CM | POA: Insufficient documentation

## 2013-09-27 DIAGNOSIS — Z79899 Other long term (current) drug therapy: Secondary | ICD-10-CM

## 2013-09-27 DIAGNOSIS — Z3202 Encounter for pregnancy test, result negative: Secondary | ICD-10-CM | POA: Insufficient documentation

## 2013-09-27 DIAGNOSIS — F10239 Alcohol dependence with withdrawal, unspecified: Principal | ICD-10-CM | POA: Diagnosis present

## 2013-09-27 DIAGNOSIS — K219 Gastro-esophageal reflux disease without esophagitis: Secondary | ICD-10-CM | POA: Insufficient documentation

## 2013-09-27 DIAGNOSIS — K766 Portal hypertension: Secondary | ICD-10-CM | POA: Insufficient documentation

## 2013-09-27 DIAGNOSIS — F10129 Alcohol abuse with intoxication, unspecified: Secondary | ICD-10-CM | POA: Diagnosis present

## 2013-09-27 DIAGNOSIS — R110 Nausea: Secondary | ICD-10-CM | POA: Insufficient documentation

## 2013-09-27 DIAGNOSIS — K7460 Unspecified cirrhosis of liver: Secondary | ICD-10-CM | POA: Diagnosis present

## 2013-09-27 DIAGNOSIS — R109 Unspecified abdominal pain: Secondary | ICD-10-CM

## 2013-09-27 DIAGNOSIS — D649 Anemia, unspecified: Secondary | ICD-10-CM

## 2013-09-27 LAB — COMPREHENSIVE METABOLIC PANEL
ALT: 55 U/L — ABNORMAL HIGH (ref 0–35)
Albumin: 3.6 g/dL (ref 3.5–5.2)
Alkaline Phosphatase: 123 U/L — ABNORMAL HIGH (ref 39–117)
BUN: 6 mg/dL (ref 6–23)
Chloride: 101 mEq/L (ref 96–112)
Potassium: 3.9 mEq/L (ref 3.5–5.1)
Sodium: 136 mEq/L (ref 135–145)
Total Bilirubin: 0.8 mg/dL (ref 0.3–1.2)
Total Protein: 9.1 g/dL — ABNORMAL HIGH (ref 6.0–8.3)

## 2013-09-27 LAB — URINALYSIS, ROUTINE W REFLEX MICROSCOPIC
Glucose, UA: NEGATIVE mg/dL
Ketones, ur: NEGATIVE mg/dL
Nitrite: NEGATIVE
Protein, ur: NEGATIVE mg/dL
Urobilinogen, UA: 1 mg/dL (ref 0.0–1.0)

## 2013-09-27 LAB — CBC WITH DIFFERENTIAL/PLATELET
Basophils Relative: 1 % (ref 0–1)
Hemoglobin: 11.9 g/dL — ABNORMAL LOW (ref 12.0–15.0)
Lymphs Abs: 3.3 10*3/uL (ref 0.7–4.0)
Monocytes Relative: 5 % (ref 3–12)
Neutro Abs: 4.3 10*3/uL (ref 1.7–7.7)
Neutrophils Relative %: 53 % (ref 43–77)
Platelets: 138 10*3/uL — ABNORMAL LOW (ref 150–400)
RBC: 3.84 MIL/uL — ABNORMAL LOW (ref 3.87–5.11)

## 2013-09-27 LAB — POCT PREGNANCY, URINE: Preg Test, Ur: NEGATIVE

## 2013-09-27 LAB — URINE MICROSCOPIC-ADD ON

## 2013-09-27 LAB — ETHANOL: Alcohol, Ethyl (B): 254 mg/dL — ABNORMAL HIGH (ref 0–11)

## 2013-09-27 LAB — RAPID URINE DRUG SCREEN, HOSP PERFORMED
Amphetamines: NOT DETECTED
Barbiturates: NOT DETECTED
Benzodiazepines: NOT DETECTED
Tetrahydrocannabinol: NOT DETECTED

## 2013-09-27 MED ORDER — LORAZEPAM 1 MG PO TABS
0.0000 mg | ORAL_TABLET | Freq: Four times a day (QID) | ORAL | Status: DC
  Administered 2013-09-27 (×2): 1 mg via ORAL
  Filled 2013-09-27 (×2): qty 1

## 2013-09-27 MED ORDER — ACETAMINOPHEN 325 MG PO TABS: 650.0000 mg | ORAL_TABLET | Freq: Four times a day (QID) | ORAL | Status: DC | PRN

## 2013-09-27 MED ORDER — ALUM & MAG HYDROXIDE-SIMETH 200-200-20 MG/5ML PO SUSP: 30.0000 mL | ORAL | Status: DC | PRN

## 2013-09-27 MED ORDER — ONDANSETRON 4 MG PO TBDP
4.0000 mg | ORAL_TABLET | Freq: Once | ORAL | Status: AC
  Administered 2013-09-27: 4 mg via ORAL
  Filled 2013-09-27: qty 1

## 2013-09-27 MED ORDER — ONDANSETRON 4 MG PO TBDP
2.0000 mg | ORAL_TABLET | Freq: Once | ORAL | Status: AC
  Administered 2013-09-27: 2 mg via ORAL
  Filled 2013-09-27: qty 1

## 2013-09-27 MED ORDER — MAGNESIUM HYDROXIDE 400 MG/5ML PO SUSP: 30.0000 mL | Freq: Every day | ORAL | Status: DC | PRN

## 2013-09-27 MED ORDER — LORAZEPAM 1 MG PO TABS
0.0000 mg | ORAL_TABLET | Freq: Four times a day (QID) | ORAL | Status: DC
  Administered 2013-09-28: 1 mg via ORAL
  Filled 2013-09-27: qty 2
  Filled 2013-09-27: qty 1

## 2013-09-27 MED ORDER — SERTRALINE HCL 50 MG PO TABS
50.0000 mg | ORAL_TABLET | Freq: Every day | ORAL | Status: DC
  Administered 2013-09-27: 50 mg via ORAL
  Filled 2013-09-27: qty 1

## 2013-09-27 MED ORDER — LORAZEPAM 1 MG PO TABS: 0.0000 mg | ORAL_TABLET | Freq: Two times a day (BID) | ORAL | Status: DC

## 2013-09-27 MED ORDER — LORAZEPAM 1 MG PO TABS
0.0000 mg | ORAL_TABLET | Freq: Two times a day (BID) | ORAL | Status: DC
  Administered 2013-09-27: 2 mg via ORAL

## 2013-09-27 MED ORDER — SERTRALINE HCL 50 MG PO TABS
50.0000 mg | ORAL_TABLET | Freq: Every day | ORAL | Status: DC
  Administered 2013-09-28 – 2013-09-29 (×2): 50 mg via ORAL
  Filled 2013-09-27 (×4): qty 1

## 2013-09-27 NOTE — ED Notes (Signed)
Pt belongings placed into locker 38 x1 belongings bag

## 2013-09-27 NOTE — BH Assessment (Signed)
Consulted with Gladis Riffle who is requesting a TTS consult for patient who reports that patient is requesting Alcohol inpatient detox treatment due to excessive etoh consumption.  Notified CSW Belenda Cruise that patient needs a TTS consult. Belenda Cruise reports that she will consult with Extender to ensure pt is evaluated.   Glorious Peach, MS, LCASA Assessment Counselor

## 2013-09-27 NOTE — Progress Notes (Signed)
37 year old female pt admitted on voluntary basis. Pt is here for ETOH detox. Pt reports regular use and has been using for many years and feels that she needs help and needs to stop drinking. Pt does report that she goes to IOP and was recently started on Zoloft for depression. Pt reports that this past week she has not taken her medications as prescribed. Pt is married and states the relationship is good and will discharge back there. Pt denies any SI on admission and able to contract for safety on the unit.  Pt was oriented to the unit and safety maintained.

## 2013-09-27 NOTE — ED Provider Notes (Signed)
CSN: [id number]     Arrival date & time 09/27/13  1191 History   First MD Initiated Contact with Patient 09/27/13 1013     Chief Complaint  Patient presents with  . ETOH detox    (Consider location/radiation/quality/duration/timing/severity/associated sxs/prior Treatment) The history is provided by the patient. No language interpreter was used.  Arlyne Brandes is a 37 year-old female with past medical history of anxiety, depression, portal hypertension, alcohol abuse presenting to emergency department with request for alcohol detox. Patient reports that she was in intensive outpatient therapy regarding alcohol abuse, reports that she cannot stop drinking and wants medical detox. Patient reports she's been drinking alcohol for approximately 11 years. Reports that she has been drinking at least every day, approximately 3-4 bottles of wine every day. Patient denied cigarette use, marijuana, cocaine, heroin. Patient reports that she gets hot and sweaty, as well as in shaking when she does not drink. Patient reports that her last drink was this morning, approximately 7:00 AM, reported that she drank one bottle of wine. Reported mild nausea. Patient reports that her depression has been pretty stable, dysphoric mood-reported that she has not been experiencing suicidal ideation, homicidal ideation, hallucinations. Denied abdominal pain, chest pain, shortness of breath, difficulty breathing, diarrhea, vomiting, neck pain, neck stiffness, headaches, numbness, tingling, loss of sensation, blurred vision, visual distortions, weakness. PCP Dr. Merla Riches  Past Medical History  Diagnosis Date  . Alcoholism   . Anxiety   . Depression   . Pneumonia   . GERD (gastroesophageal reflux disease)   . Cirrhosis   . Portal hypertension   . S/P alcohol detoxification     2-3 days at behavioral health previously   Past Surgical History  Procedure Laterality Date  . Cholecystectomy     Family History  Problem  Relation Age of Onset  . Colon polyps Mother   . Hypertension Mother   . Thyroid disease Mother   . Alcoholism Mother   . Alcoholism Father   . Alcohol abuse Maternal Grandfather   . Alcohol abuse Paternal Grandfather   . Alcohol abuse Paternal Aunt   . Alcohol abuse Maternal Uncle    History  Substance Use Topics  . Smoking status: Former Games developer  . Smokeless tobacco: Never Used  . Alcohol Use: 42.0 oz/week    70 Glasses of wine per week     Comment: reports 1 bottle of wine and pint of or more of vodka daily. 2 bottles of wine currently and pint of vodka some days for at least 4-5 days.a month.     OB History   Grav Para Term Preterm Abortions TAB SAB Ect Mult Living                 Review of Systems  Constitutional: Negative for fever.  HENT: Negative for trouble swallowing.   Eyes: Negative for visual disturbance.  Respiratory: Negative for chest tightness and shortness of breath.   Cardiovascular: Negative for chest pain.  Gastrointestinal: Positive for nausea. Negative for vomiting, abdominal pain and diarrhea.  Neurological: Negative for dizziness, weakness and headaches.  Psychiatric/Behavioral: Positive for dysphoric mood.       Alcohol abuse  All other systems reviewed and are negative.    Allergies  Morphine and related  Home Medications   Current Outpatient Rx  Name  Route  Sig  Dispense  Refill  . cetirizine (ZYRTEC) 10 MG tablet   Oral   Take 10 mg by mouth daily as needed for allergies.         [name]  esomeprazole (NEXIUM) 40 MG capsule   Oral   Take 40 mg by mouth daily.         . Multiple Vitamin (MULTIVITAMIN WITH MINERALS) TABS   Oral   Take 1 tablet by mouth daily.         . sertraline (ZOLOFT) 50 MG tablet   Oral   Take 50 mg by mouth daily.         . traZODone (DESYREL) 50 MG tablet   Oral   Take 1 tablet (50 mg total) by mouth at bedtime.   30 tablet   0    LMP 05/27/2013 Physical Exam  Nursing note and vitals  reviewed. Constitutional: She is oriented to person, place, and time. She appears well-developed and well-nourished. No distress.  HENT:  Head: Normocephalic and atraumatic.  Mouth/Throat: Oropharynx is clear and moist. No oropharyngeal exudate.  Eyes: Conjunctivae and EOM are normal. Pupils are equal, round, and reactive to light. Right eye exhibits no discharge. Left eye exhibits no discharge.  Negative nystagmus  Neck: Normal range of motion. Neck supple.  Negative neck stiffness Negative nuchal rigidity Negative cervical lymphadenopathy Negative pain upon palpation to C-spine  Cardiovascular: Normal rate, regular rhythm and normal heart sounds.  Exam reveals no friction rub.   No murmur heard. Pulses:      Radial pulses are 2+ on the right side, and 2+ on the left side.  Pulmonary/Chest: Effort normal and breath sounds normal. No respiratory distress. She has no wheezes. She has no rales.  Abdominal: Soft. Bowel sounds are normal. There is no tenderness. There is no guarding.  Musculoskeletal: Normal range of motion.  Lymphadenopathy:    She has no cervical adenopathy.  Neurological: She is alert and oriented to person, place, and time. No cranial nerve deficit. She exhibits normal muscle tone. Coordination normal.  Cranial nerves III through XII grossly intact Strength 5+5+ upper and lower extremities bilaterally with resistance applied, equal distribution noted Negative asterixis  Skin: Skin is warm and dry. No rash noted. She is not diaphoretic. No erythema.  Psychiatric: Her behavior is normal. Thought content normal.  Flat affect Appears goal oriented    ED Course  Procedures (including critical care time) Labs Review Labs Reviewed  CBC WITH DIFFERENTIAL  COMPREHENSIVE METABOLIC PANEL  ETHANOL  URINE RAPID DRUG SCREEN (HOSP PERFORMED)  URINALYSIS, ROUTINE W REFLEX MICROSCOPIC   Imaging Review No results found.  EKG Interpretation   None       MDM  No  diagnosis found.  Filed Vitals:   09/27/13 1608 09/27/13 1737 09/27/13 1757 09/27/13 2009  BP: 135/77 148/78 158/89 145/83  Pulse: 109 88 114 108  Temp:   98 F (36.7 C) 98.1 F (36.7 C)  TempSrc:   Oral Oral  Resp:   16 18  SpO2:   97% 94%   Patient presenting to the ED with request for alcohol detox. Patient currently has been in intensive outpatient therapy for alcohol - not working for her, patient wants medical detox. Reported that she drinks at least 4-5 bottles of wine per day, last drink 4:00AM this morning with one bottle of wine. Reported that she is depressed - dysphoric mood that has been stable. Denied SI, HI, hallucinations. Alert and oriented. Flat affect. Heart rate and rhythm normal. Lungs clear to auscultation. Full ROM to upper and lower extremities bilaterally. Strength intact. Negative neurological deficits noted. Negative asterixis.  Urine pregnancy negative. Urine drug screen negative. UA noted large leukocytes, WBC  7-10, many squamous - unclean catch. CBC trend for low Hgb and Hct when compared to previous labs. CMP noted elevated AST and ALT and alk phos when compared to one month ago - doubt hepatitis at this time, but patient at risk for alcohol induced hepatitis. Elevated ethanol os 254. Patient not currently in DTs. Elevated alcohol, last drink this morning, a bottle of wine. Patient medically cleared. Psych orders placed. Patient move to psych ED. CIWA protocol ordered.   Raymon Mutton, PA-C 09/29/13 1510

## 2013-09-27 NOTE — BH Assessment (Signed)
Patient accepted to [facility] by [facility]. Attending MD Jonalagadda, bed assigned 500-1.

## 2013-09-27 NOTE — BH Assessment (Signed)
Consulted with Psych extender Julieanne Cotton who reports that she is making rounds and evaluating patients. Jospehine agreed that she would evaluate patient.   Glorious Peach, MS, LCASA Assessment Counselor

## 2013-09-27 NOTE — ED Notes (Signed)
Per pt has been drinking daily for 4 years-has been in outpatient program-states she can't stop drinking

## 2013-09-27 NOTE — Tx Team (Signed)
Initial Interdisciplinary Treatment Plan  PATIENT STRENGTHS: (choose at least two) Ability for insight Average or above average intelligence General fund of knowledge Supportive family/friends  PATIENT STRESSORS: Substance abuse   PROBLEM LIST: Problem List/Patient Goals Date to be addressed Date deferred Reason deferred Estimated date of resolution  ETOH abuse 09/27/13     Depression 09/27/13                                                DISCHARGE CRITERIA:  Ability to meet basic life and health needs Improved stabilization in mood, thinking, and/or behavior Verbal commitment to aftercare and medication compliance Withdrawal symptoms are absent or subacute and managed without 24-hour nursing intervention  PRELIMINARY DISCHARGE PLAN: Attend aftercare/continuing care group Return to previous living arrangement  PATIENT/FAMIILY INVOLVEMENT: This treatment plan has been presented to and reviewed with the patient, Sara [name], and/or family member, .  The patient and family have been given the opportunity to ask questions and make suggestions.  [name], [place] 09/27/2013, 10:30 PM

## 2013-09-27 NOTE — Consult Note (Signed)
[facility] Face-to-Face Psychiatry Consult   Reason for Consult:  ALCOHOL DEPENDENCE Referring Physician:   Yesika [name] is an 37 y.o. female.  Assessment: AXIS I:  alcohol dependence AXIS II:  Deferred AXIS III:   Past Medical History  Diagnosis Date  . Alcoholism   . Anxiety   . Depression   . Pneumonia   . GERD (gastroesophageal reflux disease)   . Cirrhosis   . Portal hypertension   . S/P alcohol detoxification     2-3 days at behavioral health previously   AXIS IV:  other psychosocial or environmental problems and problems related to social environment AXIS V:  41-50 serious symptoms  Plan:  Recommend psychiatric Inpatient admission when medically cleared.  Subjective:   Sara [name] is a 37 y.o. female patient admitted with Alcohol dependence, Recurrent Major depressive d/o.  HPI:  Patient is here with c/o alcohol dependence, unable to stop drinking alcohol.  She is a patient at our outpatient clinic and sees Rae Halsted for depression.  Patient was last admitted for detox in our inpatient Psychiatric unit 5 years ago.  Patient states she started drinking alcohol at age 6 but have been drinking heavily since then.  Patient drinks  Unknown amount of wine to get her intoxicated.  She also drinks 1/2 pint Vodka occassionally.  She reports drinking  Alcohol daily for 11 years.  She denies alcohol withdrawal symptoms but drinks occasionally until she passes out .  She reports poor sleep and appetite but states her weight remains steady.  She denies SI/SE/HI/AVH and she does not exhibits any paranoia.  Patient denies use of any illicit drug.  We have accepted patient to our inpatient chemical dependency unit for alcohol detox.  We will also resume her antidepressant Zoloft while in our unit. HPI Elements:   Location:  [facility]. Quality:  MODERATE. Severity:  MODERATE. Duration:  25 YEARS.  Past Psychiatric History: Past Medical History  Diagnosis Date  . Alcoholism   . Anxiety   .  Depression   . Pneumonia   . GERD (gastroesophageal reflux disease)   . Cirrhosis   . Portal hypertension   . S/P alcohol detoxification     2-3 days at behavioral health previously    reports that she has quit smoking. She has never used smokeless tobacco. She reports that she drinks about 42.0 ounces of alcohol per week. She reports that she does not use illicit drugs. Family History  Problem Relation Age of Onset  . Colon polyps Mother   . Hypertension Mother   . Thyroid disease Mother   . Alcoholism Mother   . Alcoholism Father   . Alcohol abuse Maternal Grandfather   . Alcohol abuse Paternal Grandfather   . Alcohol abuse Paternal Aunt   . Alcohol abuse Maternal Uncle            Allergies:   Allergies  Allergen Reactions  . Morphine And Related     Slowed HR, lowered BP    ACT Assessment Complete:  No:   Past Psychiatric History: Diagnosis:  Alcohol dependence  Hospitalizations:  5 years ago  Outpatient Care:  [facility]  Substance Abuse Care:  denies  Self-Mutilation:  denies  Suicidal Attempts:  denies  Homicidal Behaviors:  denies   Violent Behaviors:  denies   Place of Residence:  [place] Marital Status:  married Employed/Unemployed:  unemployed Education:  unknown Family Supports:  yes Objective: Blood pressure 129/76, pulse 102, temperature 98.7 F (37.1 C), temperature  source Oral, resp. rate 18, last menstrual period 05/27/2013, SpO2 96.00%.There is no weight on file to calculate BMI. Results for orders placed during the hospital encounter of 09/27/13 (from the past 72 hour(s))  CBC WITH DIFFERENTIAL     Status: Abnormal   Collection Time    09/27/13 10:15 AM      Result Value Range   WBC 8.1  4.0 - 10.5 K/uL   RBC 3.84 (*) 3.87 - 5.11 MIL/uL   Hemoglobin 11.9 (*) 12.0 - 15.0 g/dL   HCT 40.9 (*) 81.1 - 91.4 %   MCV 92.7  78.0 - 100.0 fL   MCH 31.0  26.0 - 34.0 pg   MCHC 33.4  30.0 - 36.0 g/dL   RDW 78.2 (*) 95.6 - 21.3 %    Platelets 138 (*) 150 - 400 K/uL   Neutrophils Relative % 53  43 - 77 %   Neutro Abs 4.3  1.7 - 7.7 K/uL   Lymphocytes Relative 41  12 - 46 %   Lymphs Abs 3.3  0.7 - 4.0 K/uL   Monocytes Relative 5  3 - 12 %   Monocytes Absolute 0.4  0.1 - 1.0 K/uL   Eosinophils Relative 1  0 - 5 %   Eosinophils Absolute 0.1  0.0 - 0.7 K/uL   Basophils Relative 1  0 - 1 %   Basophils Absolute 0.0  0.0 - 0.1 K/uL  COMPREHENSIVE METABOLIC PANEL     Status: Abnormal   Collection Time    09/27/13 10:15 AM      Result Value Range   Sodium 136  135 - 145 mEq/L   Potassium 3.9  3.5 - 5.1 mEq/L   Chloride 101  96 - 112 mEq/L   CO2 23  19 - 32 mEq/L   Glucose, Bld 143 (*) 70 - 99 mg/dL   BUN 6  6 - 23 mg/dL   Creatinine, Ser 0.86 (*) 0.50 - 1.10 mg/dL   Calcium 9.0  8.4 - 57.8 mg/dL   Total Protein 9.1 (*) 6.0 - 8.3 g/dL   Albumin 3.6  3.5 - 5.2 g/dL   AST 469 (*) 0 - 37 U/L   ALT 55 (*) 0 - 35 U/L   Alkaline Phosphatase 123 (*) 39 - 117 U/L   Total Bilirubin 0.8  0.3 - 1.2 mg/dL   GFR calc non Af Amer >90  >90 mL/min   GFR calc Af Amer >90  >90 mL/min   Comment: (NOTE)     The eGFR has been calculated using the CKD EPI equation.     This calculation has not been validated in all clinical situations.     eGFR's persistently <90 mL/min signify possible Chronic Kidney     Disease.  ETHANOL     Status: Abnormal   Collection Time    09/27/13 10:15 AM      Result Value Range   Alcohol, Ethyl (B) 254 (*) 0 - 11 mg/dL   Comment:            LOWEST DETECTABLE LIMIT FOR     SERUM ALCOHOL IS 11 mg/dL     FOR MEDICAL PURPOSES ONLY  URINE RAPID DRUG SCREEN (HOSP PERFORMED)     Status: None   Collection Time    09/27/13 11:20 AM      Result Value Range   Opiates NONE DETECTED  NONE DETECTED   Cocaine NONE DETECTED  NONE DETECTED   Benzodiazepines NONE DETECTED  NONE  DETECTED   Amphetamines NONE DETECTED  NONE DETECTED   Tetrahydrocannabinol NONE DETECTED  NONE DETECTED   Barbiturates NONE DETECTED   NONE DETECTED   Comment:            DRUG SCREEN FOR MEDICAL PURPOSES     ONLY.  IF CONFIRMATION IS NEEDED     FOR ANY PURPOSE, NOTIFY LAB     WITHIN 5 DAYS.                LOWEST DETECTABLE LIMITS     FOR URINE DRUG SCREEN     Drug Class       Cutoff (ng/mL)     Amphetamine      1000     Barbiturate      200     Benzodiazepine   200     Tricyclics       300     Opiates          300     Cocaine          300     THC              50  URINALYSIS, ROUTINE W REFLEX MICROSCOPIC     Status: Abnormal   Collection Time    09/27/13 11:20 AM      Result Value Range   Color, Urine YELLOW  YELLOW   APPearance CLOUDY (*) CLEAR   Specific Gravity, Urine 1.023  1.005 - 1.030   pH 5.5  5.0 - 8.0   Glucose, UA NEGATIVE  NEGATIVE mg/dL   Hgb urine dipstick NEGATIVE  NEGATIVE   Bilirubin Urine NEGATIVE  NEGATIVE   Ketones, ur NEGATIVE  NEGATIVE mg/dL   Protein, ur NEGATIVE  NEGATIVE mg/dL   Urobilinogen, UA 1.0  0.0 - 1.0 mg/dL   Nitrite NEGATIVE  NEGATIVE   Leukocytes, UA LARGE (*) NEGATIVE  URINE MICROSCOPIC-ADD ON     Status: Abnormal   Collection Time    09/27/13 11:20 AM      Result Value Range   Squamous Epithelial / LPF MANY (*) RARE   WBC, UA 7-10  <3 WBC/hpf   Bacteria, UA MANY (*) RARE   Urine-Other MUCOUS PRESENT    POCT PREGNANCY, URINE     Status: None   Collection Time    09/27/13 11:31 AM      Result Value Range   Preg Test, Ur NEGATIVE  NEGATIVE   Comment:            THE SENSITIVITY OF THIS     METHODOLOGY IS >24 mIU/mL   Labs are reviewed and are pertinent for Alcohol level 254,  Elevated LFT rest of labs are unremarkable..  Current Facility-Administered Medications  Medication Dose Route Frequency Provider Last Rate Last Dose  . LORazepam (ATIVAN) tablet 0-4 mg  0-4 mg Oral Q6H Marissa Sciacca, PA-C   1 mg at 09/27/13 1243   Followed by  . [START ON 09/29/2013] LORazepam (ATIVAN) tablet 0-4 mg  0-4 mg Oral Q12H Marissa Sciacca, PA-C       Current Outpatient  Prescriptions  Medication Sig Dispense Refill  . cetirizine (ZYRTEC) 10 MG tablet Take 10 mg by mouth daily as needed for allergies.      [name] esomeprazole (NEXIUM) 40 MG capsule Take 40 mg by mouth daily.      . Multiple Vitamin (MULTIVITAMIN WITH MINERALS) TABS Take 1 tablet by mouth daily.      . sertraline (ZOLOFT) 50  MG tablet Take 50 mg by mouth daily.      . traZODone (DESYREL) 50 MG tablet Take 1 tablet (50 mg total) by mouth at bedtime.  30 tablet  0    Psychiatric Specialty Exam:     Blood pressure 129/76, pulse 102, temperature 98.7 F (37.1 C), temperature source Oral, resp. rate 18, last menstrual period 05/27/2013, SpO2 96.00%.There is no weight on file to calculate BMI.  General Appearance: Casual  Eye Contact::  Good  Speech:  Clear and Coherent and Normal Rate  Volume:  Normal  Mood:  Anxious, Depressed and Dysphoric  Affect:  Congruent, Depressed, Flat and Labile  Thought Process:  Coherent, Goal Directed and Intact  Orientation:  Full (Time, Place, and Person)  Thought Content:  NA  Suicidal Thoughts:  No  Homicidal Thoughts:  No  Memory:  Immediate;   Good Recent;   Good Remote;   Good  Judgement:  Fair  Insight:  Fair  Psychomotor Activity:  Normal  Concentration:  Good  Recall:  NA  Akathisia:  NA  Handed:  Right  AIMS (if indicated):     Assets:  Desire for Improvement  Sleep:      Treatment Plan Summary:  Consult and face to face interview with Dr Tawni Carnes We have accepted patient for detox in our inpatient unit We will use our Ativan protocol due to her elevated LFT We will also resume her Zoloft for depression. Daily contact with patient to assess and evaluate symptoms and progress in treatment Medication management  Earney Navy   PMHNP-BC  09/27/2013 2:50 PM

## 2013-09-28 DIAGNOSIS — F1020 Alcohol dependence, uncomplicated: Secondary | ICD-10-CM | POA: Diagnosis present

## 2013-09-28 DIAGNOSIS — F10939 Alcohol use, unspecified with withdrawal, unspecified: Principal | ICD-10-CM | POA: Diagnosis present

## 2013-09-28 DIAGNOSIS — F10239 Alcohol dependence with withdrawal, unspecified: Principal | ICD-10-CM | POA: Diagnosis present

## 2013-09-28 DIAGNOSIS — F1994 Other psychoactive substance use, unspecified with psychoactive substance-induced mood disorder: Secondary | ICD-10-CM | POA: Diagnosis present

## 2013-09-28 DIAGNOSIS — F10129 Alcohol abuse with intoxication, unspecified: Secondary | ICD-10-CM | POA: Diagnosis present

## 2013-09-28 LAB — URINE CULTURE: Colony Count: 60000

## 2013-09-28 MED ORDER — ADULT MULTIVITAMIN W/MINERALS CH
1.0000 | ORAL_TABLET | Freq: Every day | ORAL | Status: DC
  Administered 2013-09-28 – 2013-10-03 (×6): 1 via ORAL
  Filled 2013-09-28 (×8): qty 1

## 2013-09-28 MED ORDER — CHLORDIAZEPOXIDE HCL 25 MG PO CAPS
25.0000 mg | ORAL_CAPSULE | Freq: Four times a day (QID) | ORAL | Status: AC
  Administered 2013-09-28 (×4): 25 mg via ORAL
  Filled 2013-09-28 (×4): qty 1

## 2013-09-28 MED ORDER — CHLORDIAZEPOXIDE HCL 25 MG PO CAPS
25.0000 mg | ORAL_CAPSULE | ORAL | Status: AC
  Administered 2013-09-29 – 2013-09-30 (×2): 25 mg via ORAL
  Filled 2013-09-28: qty 1

## 2013-09-28 MED ORDER — CHLORDIAZEPOXIDE HCL 25 MG PO CAPS
25.0000 mg | ORAL_CAPSULE | Freq: Every day | ORAL | Status: AC
  Administered 2013-10-01: 25 mg via ORAL
  Filled 2013-09-28 (×2): qty 1

## 2013-09-28 MED ORDER — CHLORDIAZEPOXIDE HCL 25 MG PO CAPS
25.0000 mg | ORAL_CAPSULE | Freq: Four times a day (QID) | ORAL | Status: AC | PRN
  Administered 2013-09-29 – 2013-09-30 (×3): 25 mg via ORAL
  Filled 2013-09-28 (×2): qty 1

## 2013-09-28 MED ORDER — TRAZODONE HCL 50 MG PO TABS
50.0000 mg | ORAL_TABLET | Freq: Every day | ORAL | Status: DC
  Administered 2013-09-28 – 2013-10-02 (×5): 50 mg via ORAL
  Filled 2013-09-28 (×7): qty 1

## 2013-09-28 MED ORDER — HYDROXYZINE HCL 25 MG PO TABS
25.0000 mg | ORAL_TABLET | Freq: Four times a day (QID) | ORAL | Status: AC | PRN
  Administered 2013-09-29 – 2013-09-30 (×5): 25 mg via ORAL
  Filled 2013-09-28 (×5): qty 1

## 2013-09-28 MED ORDER — VITAMIN B-1 100 MG PO TABS
100.0000 mg | ORAL_TABLET | Freq: Every day | ORAL | Status: DC
  Administered 2013-09-29 – 2013-10-03 (×5): 100 mg via ORAL
  Filled 2013-09-28 (×6): qty 1

## 2013-09-28 MED ORDER — CHLORDIAZEPOXIDE HCL 25 MG PO CAPS
25.0000 mg | ORAL_CAPSULE | Freq: Three times a day (TID) | ORAL | Status: AC
  Administered 2013-09-29 (×2): 25 mg via ORAL
  Filled 2013-09-28 (×3): qty 1

## 2013-09-28 MED ORDER — THIAMINE HCL 100 MG/ML IJ SOLN
100.0000 mg | Freq: Once | INTRAMUSCULAR | Status: AC
  Administered 2013-09-28: 100 mg via INTRAMUSCULAR
  Filled 2013-09-28: qty 2

## 2013-09-28 MED ORDER — LOPERAMIDE HCL 2 MG PO CAPS: 2.0000 mg | ORAL_CAPSULE | ORAL | Status: AC | PRN

## 2013-09-28 MED ORDER — ONDANSETRON 4 MG PO TBDP
4.0000 mg | ORAL_TABLET | Freq: Four times a day (QID) | ORAL | Status: AC | PRN
  Administered 2013-09-28: 4 mg via ORAL
  Filled 2013-09-28: qty 1

## 2013-09-28 NOTE — Progress Notes (Signed)
Psychoeducational Group Note  Date: 09/28/2013 Time:  1015  Group Topic/Focus:  Identifying Needs:   The focus of this group is to help patients identify their personal needs that have been historically problematic and identify healthy behaviors to address their needs.  Participation Level:  Active  Participation Quality:  Appropriate  Affect:  Appropriate  Cognitive:  Oriented  Insight:  Improving  Engagement in Group:  Engaged  Additional Comments:  Participated in the group.   Threasa Kinch A 

## 2013-09-28 NOTE — Progress Notes (Signed)
Report received from B. McNichol RN. Writer observed patient sitting in her room with sad affect. Patient reported that she felt nauseated and received ginger ale to sip b/c she had received zofran in the ED. Support offered, Clinical research associate assisted patient with use of the phone. Safety maintained on unit with 15 min checks.

## 2013-09-28 NOTE — H&P (Signed)
Psychiatric Admission Assessment Adult  Patient Identification:  Sara [name] Date of Evaluation:  09/28/2013 Chief Complaint:  Alcohol Dependence History of Present Illness: Patient admitted voluntarily, emergently from [facility] for substance induced mood disorder and alcohol intoxication, withdrawal and dependence. Reportedly she has failed to stop drinking alcohol due to withdrawal symptoms. She has been nauseated, shaky, tremors, vomiting, increased anxiety and heart racing and night sweats. She is a patient at our outpatient clinic and sees Rae Halsted for depression. Patient has interested in [facility] which she failed prior to admission due to continues to drinking in between the sessions. Patient was last admitted for detox in our inpatient psychiatric unit 5 years ago. Patient started drinking alcohol at age 37 but have been drinking heavily for the last five years. Patient drinks unknown amount of wine to get her intoxicated daily and has neglected personal health and care of the home. She also drinks 1/2 pint Vodka occassionally. She drinks occasionally until she passes out. She has denied public drinking and DWI's. She has poor sleep and appetite and no changes in her weight.  Patient denies use of any illicit drug. She is married, lives with her family, has two children (17,12) and husband who is supportive. Her sister drinks socially.   Elements:  Location:  [facility] adult unit. Quality:  depression, anxiety. Severity:  withdrawal symptoms'. Timing:  strained marriage, relationship and health problems. Duration:  four weeks. Context:  wants to be detoxed and sister is supportive.. Associated Signs/Synptoms: Depression Symptoms:  depressed mood, anhedonia, psychomotor retardation, fatigue, feelings of worthlessness/guilt, hopelessness, impaired memory, anxiety, disturbed sleep, decreased labido, decreased appetite, (Hypo) Manic Symptoms:  Impulsivity, Irritable Mood, Anxiety Symptoms:   Excessive Worry, Psychotic Symptoms:  denied PTSD Symptoms: NA  Psychiatric Specialty Exam: Physical Exam  ROS  Blood pressure 145/93, pulse 111, temperature 98.2 F (36.8 C), temperature source Oral, resp. rate 16, height 5\' 1"  (1.549 m), weight 81.647 kg (180 lb), last menstrual period 05/27/2013.Body mass index is 34.03 kg/(m^2).  General Appearance: Disheveled and Guarded  Eye Solicitor::  Fair  Speech:  Clear and Coherent, Normal Rate and Slow  Volume:  Decreased  Mood:  Anxious, Depressed, Hopeless and Worthless  Affect:  Depressed, Flat and Restricted  Thought Process:  Goal Directed and Intact  Orientation:  Full (Time, Place, and Person)  Thought Content:  WDL  Suicidal Thoughts:  No  Homicidal Thoughts:  No  Memory:  Immediate;   Fair  Judgement:  Impaired  Insight:  Lacking  Psychomotor Activity:  Psychomotor Retardation and Restlessness  Concentration:  Fair  Recall:  Fair  Akathisia:  NA  Handed:  Right  AIMS (if indicated):     Assets:  Communication Skills Desire for Improvement Financial Resources/Insurance Housing Resilience Social Support Transportation  Sleep:  Number of Hours: 3.25    Past Psychiatric History: Diagnosis: [facility] for depression and alcohol  Hospitalizations: [facility]  Outpatient Care: [facility]  Substance Abuse Care: [facility]IOP  Self-Mutilation: NO  Suicidal Attempts:NO  Violent Behaviors: NO   Past Medical History:   Past Medical History  Diagnosis Date  . Alcoholism   . Anxiety   . Depression   . Pneumonia   . GERD (gastroesophageal reflux disease)   . Cirrhosis   . Portal hypertension   . S/P alcohol detoxification     2-3 days at behavioral health previously   None. Allergies:   Allergies  Allergen Reactions  . Morphine And Related     Slowed HR, lowered BP  PTA Medications: Prescriptions prior to admission  Medication Sig Dispense Refill  . cetirizine (ZYRTEC) 10 MG tablet Take 10 mg by mouth daily as needed for  allergies.      [name] esomeprazole (NEXIUM) 40 MG capsule Take 40 mg by mouth daily.      . Multiple Vitamin (MULTIVITAMIN WITH MINERALS) TABS Take 1 tablet by mouth daily.      . sertraline (ZOLOFT) 50 MG tablet Take 50 mg by mouth daily.      . traZODone (DESYREL) 50 MG tablet Take 1 tablet (50 mg total) by mouth at bedtime.  30 tablet  0    Previous Psychotropic Medications:  Medication/Dose  zoloft  trazodone             Substance Abuse History in the last 12 months:  yes  Consequences of Substance Abuse: Medical Consequences:  cirrhosis Family Consequences:  strained relations with husband and children Withdrawal Symptoms:   Cramps Diaphoresis Nausea Tremors Vomiting  Social History:  reports that she has quit smoking. She has never used smokeless tobacco. She reports that she drinks about 42.0 ounces of alcohol per week. She reports that she does not use illicit drugs. Additional Social History:                      Current Place of Residence:   Place of Birth:   Family Members: Marital Status:  Married Children:  Sons:  Daughters: Relationships: Education:  Corporate treasurer Problems/Performance: Religious Beliefs/Practices: History of Abuse (Emotional/Phsycial/Sexual) Teacher, music History:  None. Legal History: Hobbies/Interests:  Family History:   Family History  Problem Relation Age of Onset  . Colon polyps Mother   . Hypertension Mother   . Thyroid disease Mother   . Alcoholism Mother   . Alcoholism Father   . Alcohol abuse Maternal Grandfather   . Alcohol abuse Paternal Grandfather   . Alcohol abuse Paternal Aunt   . Alcohol abuse Maternal Uncle     Results for orders placed during the hospital encounter of 09/27/13 (from the past 72 hour(s))  CBC WITH DIFFERENTIAL     Status: Abnormal   Collection Time    09/27/13 10:15 AM      Result Value Range   WBC 8.1  4.0 - 10.5 K/uL   RBC 3.84 (*) 3.87 - 5.11  MIL/uL   Hemoglobin 11.9 (*) 12.0 - 15.0 g/dL   HCT 16.1 (*) 09.6 - 04.5 %   MCV 92.7  78.0 - 100.0 fL   MCH 31.0  26.0 - 34.0 pg   MCHC 33.4  30.0 - 36.0 g/dL   RDW 40.9 (*) 81.1 - 91.4 %   Platelets 138 (*) 150 - 400 K/uL   Neutrophils Relative % 53  43 - 77 %   Neutro Abs 4.3  1.7 - 7.7 K/uL   Lymphocytes Relative 41  12 - 46 %   Lymphs Abs 3.3  0.7 - 4.0 K/uL   Monocytes Relative 5  3 - 12 %   Monocytes Absolute 0.4  0.1 - 1.0 K/uL   Eosinophils Relative 1  0 - 5 %   Eosinophils Absolute 0.1  0.0 - 0.7 K/uL   Basophils Relative 1  0 - 1 %   Basophils Absolute 0.0  0.0 - 0.1 K/uL  COMPREHENSIVE METABOLIC PANEL     Status: Abnormal   Collection Time    09/27/13 10:15 AM      Result Value Range   Sodium  136  135 - 145 mEq/L   Potassium 3.9  3.5 - 5.1 mEq/L   Chloride 101  96 - 112 mEq/L   CO2 23  19 - 32 mEq/L   Glucose, Bld 143 (*) 70 - 99 mg/dL   BUN 6  6 - 23 mg/dL   Creatinine, Ser 1.61 (*) 0.50 - 1.10 mg/dL   Calcium 9.0  8.4 - 09.6 mg/dL   Total Protein 9.1 (*) 6.0 - 8.3 g/dL   Albumin 3.6  3.5 - 5.2 g/dL   AST 045 (*) 0 - 37 U/L   ALT 55 (*) 0 - 35 U/L   Alkaline Phosphatase 123 (*) 39 - 117 U/L   Total Bilirubin 0.8  0.3 - 1.2 mg/dL   GFR calc non Af Amer >90  >90 mL/min   GFR calc Af Amer >90  >90 mL/min   Comment: (NOTE)     The eGFR has been calculated using the CKD EPI equation.     This calculation has not been validated in all clinical situations.     eGFR's persistently <90 mL/min signify possible Chronic Kidney     Disease.  ETHANOL     Status: Abnormal   Collection Time    09/27/13 10:15 AM      Result Value Range   Alcohol, Ethyl (B) 254 (*) 0 - 11 mg/dL   Comment:            LOWEST DETECTABLE LIMIT FOR     SERUM ALCOHOL IS 11 mg/dL     FOR MEDICAL PURPOSES ONLY  URINE RAPID DRUG SCREEN (HOSP PERFORMED)     Status: None   Collection Time    09/27/13 11:20 AM      Result Value Range   Opiates NONE DETECTED  NONE DETECTED   Cocaine NONE  DETECTED  NONE DETECTED   Benzodiazepines NONE DETECTED  NONE DETECTED   Amphetamines NONE DETECTED  NONE DETECTED   Tetrahydrocannabinol NONE DETECTED  NONE DETECTED   Barbiturates NONE DETECTED  NONE DETECTED   Comment:            DRUG SCREEN FOR MEDICAL PURPOSES     ONLY.  IF CONFIRMATION IS NEEDED     FOR ANY PURPOSE, NOTIFY LAB     WITHIN 5 DAYS.                LOWEST DETECTABLE LIMITS     FOR URINE DRUG SCREEN     Drug Class       Cutoff (ng/mL)     Amphetamine      1000     Barbiturate      200     Benzodiazepine   200     Tricyclics       300     Opiates          300     Cocaine          300     THC              50  URINALYSIS, ROUTINE W REFLEX MICROSCOPIC     Status: Abnormal   Collection Time    09/27/13 11:20 AM      Result Value Range   Color, Urine YELLOW  YELLOW   APPearance CLOUDY (*) CLEAR   Specific Gravity, Urine 1.023  1.005 - 1.030   pH 5.5  5.0 - 8.0   Glucose, UA NEGATIVE  NEGATIVE mg/dL   Hgb urine dipstick  NEGATIVE  NEGATIVE   Bilirubin Urine NEGATIVE  NEGATIVE   Ketones, ur NEGATIVE  NEGATIVE mg/dL   Protein, ur NEGATIVE  NEGATIVE mg/dL   Urobilinogen, UA 1.0  0.0 - 1.0 mg/dL   Nitrite NEGATIVE  NEGATIVE   Leukocytes, UA LARGE (*) NEGATIVE  URINE MICROSCOPIC-ADD ON     Status: Abnormal   Collection Time    09/27/13 11:20 AM      Result Value Range   Squamous Epithelial / LPF MANY (*) RARE   WBC, UA 7-10  <3 WBC/hpf   Bacteria, UA MANY (*) RARE   Urine-Other MUCOUS PRESENT    POCT PREGNANCY, URINE     Status: None   Collection Time    09/27/13 11:31 AM      Result Value Range   Preg Test, Ur NEGATIVE  NEGATIVE   Comment:            THE SENSITIVITY OF THIS     METHODOLOGY IS >24 mIU/mL   Psychological Evaluations:  Assessment:   DSM5:  Schizophrenia Disorders:   Obsessive-Compulsive Disorders:   Trauma-Stressor Disorders:   Substance/Addictive Disorders:  Alcohol Intoxication with Use Disorder - Severe (F10.229) and Alcohol  Withdrawal (291.81) Depressive Disorders:  Major Depressive Disorder - Unspecified (296.20)  AXIS I:  Substance Induced Mood Disorder and Alcohol intoxication, withdrawal and dependence AXIS II:  Deferred AXIS III:   Past Medical History  Diagnosis Date  . Alcoholism   . Anxiety   . Depression   . Pneumonia   . GERD (gastroesophageal reflux disease)   . Cirrhosis   . Portal hypertension   . S/P alcohol detoxification     2-3 days at behavioral health previously   AXIS IV:  occupational problems, other psychosocial or environmental problems, problems related to social environment and problems with primary support group AXIS V:  41-50 serious symptoms  Treatment Plan/Recommendations:  Admit for alcohol detox treatment.  Treatment Plan Summary: Daily contact with patient to assess and evaluate symptoms and progress in treatment Medication management Current Medications:  Current Facility-Administered Medications  Medication Dose Route Frequency Provider Last Rate Last Dose  . acetaminophen (TYLENOL) tablet 650 mg  650 mg Oral Q6H PRN Earney Navy, NP      . alum & mag hydroxide-simeth (MAALOX/MYLANTA) 200-200-20 MG/5ML suspension 30 mL  30 mL Oral Q4H PRN Earney Navy, NP      . LORazepam (ATIVAN) tablet 0-4 mg  0-4 mg Oral Q6H Earney Navy, NP   1 mg at 09/28/13 7829   Followed by  . [START ON 09/30/2013] LORazepam (ATIVAN) tablet 0-4 mg  0-4 mg Oral Q12H Earney Navy, NP   2 mg at 09/27/13 2311  . magnesium hydroxide (MILK OF MAGNESIA) suspension 30 mL  30 mL Oral Daily PRN Earney Navy, NP      . sertraline (ZOLOFT) tablet 50 mg  50 mg Oral Daily Earney Navy, NP   50 mg at 09/28/13 0825    Observation Level/Precautions:  15 minute checks  Laboratory:  Reviewed labs  Psychotherapy:  Substance abuse counseling, supportive therapy, group and milieu therapy  Medications:  Librium protocol, zoloft for depression  Consultations:  none   Discharge Concerns:  Withdrawal symptoms  Estimated LOS: 4 days  Other:     I certify that inpatient services furnished can reasonably be expected to improve the patient's condition.   Dennys Guin,JANARDHAHA R. 11/8/20148:45 AM

## 2013-09-28 NOTE — Progress Notes (Signed)
Adult Psychoeducational Group Note  Date:  09/28/2013 Time:  9:27 PM  Group Topic/Focus:  Wrap-Up Group:   The focus of this group is to help patients review their daily goal of treatment and discuss progress on daily workbooks.  Participation Level:  Active  Participation Quality:  Appropriate and Sharing  Affect:  Appropriate  Cognitive:  Appropriate  Insight: Good and Improving  Engagement in Group:  Engaged  Modes of Intervention:  Clarification, Discussion and Exploration  Additional Comments:    Lorin Mercy 09/28/2013, 9:27 PM

## 2013-09-28 NOTE — Progress Notes (Signed)
Writer observed patient sitting up in the dayroom watching tv and minimal interaction with peers. Patient reports that her parents and husband visited her today. Her affect is flat but brightens on approach. Patient reports that she plans to attend IOP here. Writer praised patient for her decision to attend outpatient. Patient currently denies si/hi/a/v hallucinations. Writer informed patient of her protocol change and what medications were scheduled for hs. Safety maintained with 15 min checks. Patient compliant with POC.

## 2013-09-28 NOTE — Progress Notes (Signed)
D:  Patient up and present in the milieu, but quiet with a flat affect.  She denies feelings of depression or hopelessness.  She also denies thoughts of self harm.  She does not that her anxiety level is quite high much of the time, but when she tells me this she is lying quietly on her bed reading a book.  She spoke with Dr. Elsie Saas who took her off of the Ativan protocol and started her on Librium.  States her plan it to attend IOP when she is discharged from here.  She also complained of mild nausea after lunch.   A:  Medications given as prescribed.  Explained the Librium protocol to her and how it is ordered.  Explained all other medications she has ordered as well.  She was given Zofran once for the nausea.  She was given support and encouragement.   R:  She has been pleasant today.  She is a bit sad, but very cooperative.  Verbalizes understanding of all medications.  Librium and Zofran have been effective thus far.  Safety is maintained with 15 minute checks.

## 2013-09-28 NOTE — [facility] Group Notes (Signed)
[facility] Group Notes:  (Clinical Social Work)  09/28/2013   3:00-4:00PM  Summary of Progress/Problems:   The main focus of today's process group was for the patient to identify ways in which they have sabotaged their own mental health wellness/recovery.  Motivational interviewing was used to explore the reasons they engage in this behavior, and reasons they may have for wanting to change.  The Stages of Change were explained to the group using a handout, and patients identified where they are with regard to changing self-defeating behaviors.  The patient expressed that she drinks to relieve her anxiety, but as a result has cirrhosis.  She is in the Contemplation Stage of Change.  Type of Therapy:  Process Group  Participation Level:  Active  Participation Quality:  Attentive  Affect:  Anxious, Blunted and Depressed  Cognitive:  Alert and Appropriate  Insight:  Developing/Improving  Engagement in Therapy:  Engaged  Modes of Intervention:  Education, Motivational Interviewing   Ambrose Mantle, LCSW 09/28/2013, 12:35 PM

## 2013-09-28 NOTE — Progress Notes (Signed)
 .  Psychoeducational Group Note    Date: 09/28/2013 Time:  0930  Goal Setting Purpose of Group: To be able to set a goal that is measurable and that can be accomplished in one day Participation Level:  Active  Participation Quality:  Attentive  Affect:  Appropriate  Cognitive:  Oriented  Insight:  Improving  Engagement in Group:  Engaged  Additional Comments:    Dione Housekeeper

## 2013-09-28 NOTE — [facility] Suicide Risk Assessment (Signed)
Suicide Risk Assessment  Admission Assessment     Nursing information obtained from:    Demographic factors:    Current Mental Status:    Loss Factors:    Historical Factors:    Risk Reduction Factors:     CLINICAL FACTORS:   Severe Anxiety and/or Agitation Depression:   Anhedonia Comorbid alcohol abuse/dependence Hopelessness Impulsivity Insomnia Recent sense of peace/wellbeing Severe Alcohol/Substance Abuse/Dependencies Unstable or Poor Therapeutic Relationship Previous Psychiatric Diagnoses and Treatments Medical Diagnoses and Treatments/Surgeries  COGNITIVE FEATURES THAT CONTRIBUTE TO RISK:  Closed-mindedness Loss of executive function Polarized thinking Thought constriction (tunnel vision)    SUICIDE RISK:   Moderate:  Frequent suicidal ideation with limited intensity, and duration, some specificity in terms of plans, no associated intent, good self-control, limited dysphoria/symptomatology, some risk factors present, and identifiable protective factors, including available and accessible social support.  PLAN OF CARE: Patient admitted voluntarily, emergently for alcohol detox treatment and substance induced depression.   I certify that inpatient services furnished can reasonably be expected to improve the patient's condition.  Shaletha Humble,JANARDHAHA R. 09/28/2013, 8:41 AM

## 2013-09-29 MED ORDER — SERTRALINE HCL 100 MG PO TABS
100.0000 mg | ORAL_TABLET | Freq: Every day | ORAL | Status: DC
  Administered 2013-09-30 – 2013-10-03 (×4): 100 mg via ORAL
  Filled 2013-09-29 (×5): qty 1

## 2013-09-29 NOTE — Progress Notes (Signed)
D   Pt is depressed and anxious  She reports sleeping well and appetite improving   She reports a low energy level and her ability to pay attention is good   She denies depression and her hopelessness is a 1  She does appear depressed but put N/A on her self inventory  She reports craving and agitaion and stomach cramps as some of her withdrawal symptoms  She plans to attend IOP and AA meetings to better take care of herself upon discharge   A   Verbal support given  Medications administered and effectiveness monitored   Q 15 min checks R   Pt safe at present

## 2013-09-29 NOTE — [facility] Group Notes (Signed)
[facility] Group Notes:  (Clinical Social Work)  09/29/2013   1:15-2:20PM  Summary of Progress/Problems:   The main focus of today's process group was to   identify the patient's current support system and decide on other supports that can be put in place.  The picture on workbook was used to discuss why additional supports are needed, then used to talk about how patients have given and received all different kinds of support.  An emphasis was placed on using counselor, doctor, therapy groups, 12-step groups, and problem-specific support groups to expand supports.  The patient expressed full comprehension of the concepts presented, and agreed that there is a need to add more supports.  The patient stated the current supports in place are her husband, mother, father, and sisters.  She stated she has gone several times to a women's AA group, and is willing to go back.  Type of Therapy:  Process Group  Participation Level:  Active  Participation Quality:  Attentive  Affect:  Anxious and Blunted  Cognitive:  Appropriate and Oriented  Insight:  Developing/Improving  Engagement in Therapy:  Engaged  Modes of Intervention:  Education,  Support and [employer], LCSW 09/29/2013, 4:00pm

## 2013-09-29 NOTE — Progress Notes (Signed)
  Psychoeducational Group Note  Date: 09/29/2013 Time:  0930  Group Topic/Focus:  Gratefulness:  The focus of this group is to help patients identify what two things they are most grateful for in their lives. What helps ground them and to center them on their work to their recovery.  Participation Level:  Active  Participation Quality:  Appropriate  Affect:  Appropriate  Cognitive:  Oriented  Insight:  Improving  Engagement in Group:  Engaged  Additional Comments:    Dione Housekeeper

## 2013-09-29 NOTE — Progress Notes (Signed)
Adult Psychoeducational Group Note  Date:  09/29/2013 Time:  8:00am  Group Topic/Focus:  Wrap-Up Group:   The focus of this group is to help patients review their daily goal of treatment and discuss progress on daily workbooks.  Participation Level:  Active  Participation Quality:  Appropriate and Attentive  Affect:  Appropriate  Cognitive:  Alert and Appropriate  Insight: Appropriate  Engagement in Group:  Engaged  Modes of Intervention:  Discussion and Education  Additional Comments:  Wrap up group we discussed 15 minute checks,enviromental checks, falls precautions and each patient checked in on how their day went. Pt stated she had a good day but is going thru withdrawals but she did get a visit from her children which made her day better.  Shelly Bombard D 09/29/2013, 10:06 PM

## 2013-09-29 NOTE — Progress Notes (Signed)
Adult Psychoeducational Group Note  Date:  09/29/2013 Time:  3:42 PM  Group Topic/Focus:  Therapeutic Activity  Participation Level:  Active  Participation Quality:  Appropriate  Affect:  Appropriate  Cognitive:  Appropriate  Insight: Appropriate  Engagement in Group:  Engaged  Modes of Intervention:  Activity  Additional Comments:  Pts played "Signs of Stress Bingo". Each pt was given a card with different signs and symptoms of stress on them (Loss of appetite, weight loss or gain etc) . Wining pts where rewarded with a piece of candy.   Tora Perches N 09/29/2013, 3:42 PM
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
## 2013-09-29 NOTE — Progress Notes (Signed)
Psychoeducational Group Note  Date:  09/29/2013 Time:  1015  Group Topic/Focus:  Making Healthy Choices:   The focus of this group is to help patients identify negative/unhealthy choices they were using prior to admission and identify positive/healthier coping strategies to replace them upon discharge.  Participation Level:  Active  Participation Quality:  Appropriate  Affect:  Appropriate  Cognitive:  Oriented  Insight:  Improving  Engagement in Group:  Engaged  Additional Comments:    Sara [name] A 09/29/2013 

## 2013-09-29 NOTE — Progress Notes (Signed)
[facility] MD Progress Note  09/29/2013 1:39 PM Sara [name]  MRN:  [id number]  Subjective:  Patient complained of feeling anxious, irritable, agitated and jumpy. She also has cramps in her stomach. She has been taking medications as ordered and has no side effects. She reportedly slept fine last night and appetite is poor and eating only half of the plate. She has contracted for safety.  She is able to attend groups and states they are helpful and learning cope with her stressed.   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 rate 18, height 5\' 1"  (1.549 m), weight 81.647 kg (180 lb), last menstrual period 05/27/2013.Body mass index is 34.03 kg/(m^2).  General Appearance: Fairly Groomed  Patent attorney::  Minimal  Speech:  Clear and Coherent and Slow  Volume:  Decreased  Mood:  Angry, Anxious, Depressed, Hopeless and Worthless  Affect:  Depressed and Flat  Thought Process:  Goal Directed and Intact  Orientation:  Full (Time, Place, and Person)  Thought Content:  WDL  Suicidal Thoughts:  No  Homicidal Thoughts:  No  Memory:  Immediate;   Fair  Judgement:  Impaired  Insight:  Lacking  Psychomotor Activity:  Psychomotor Retardation  Concentration:  Fair  Recall:  Fair  Akathisia:  NA  Handed:  Right  AIMS (if indicated):     Assets:  Communication Skills Desire for Improvement Financial  Resources/Insurance Housing Physical Health Resilience Social Support Transportation  Sleep:  Number of Hours: 3.25   Current Medications: Current Facility-Administered Medications  Medication Dose Route Frequency Provider Last Rate Last Dose  . chlordiazePOXIDE (LIBRIUM) capsule 25 mg  25 mg Oral Q6H PRN Nehemiah Settle, MD   25 mg at 09/29/13 0921  . chlordiazePOXIDE (LIBRIUM) capsule 25 mg  25 mg Oral TID Nehemiah Settle, MD   25 mg at 09/29/13 1157   Followed by  . [START ON 09/30/2013] chlordiazePOXIDE (LIBRIUM) capsule 25 mg  25 mg Oral BH-qamhs Nehemiah Settle, MD   25 mg at 09/29/13 0804   Followed by  . [START ON 10/01/2013] chlordiazePOXIDE (LIBRIUM) capsule 25 mg  25 mg Oral Daily Nehemiah Settle, MD      . hydrOXYzine (ATARAX/VISTARIL) tablet 25 mg  25 mg Oral Q6H PRN Nehemiah Settle, MD   25 mg at 09/29/13 0921  . loperamide (IMODIUM) capsule 2-4 mg  2-4 mg Oral PRN Nehemiah Settle, MD      . multivitamin with minerals tablet 1 tablet  1 tablet Oral Daily Nehemiah Settle, MD   1 tablet at 09/29/13 0804  . ondansetron (ZOFRAN-ODT) disintegrating tablet 4 mg  4 mg Oral Q6H PRN Nehemiah Settle, MD   4 mg at 09/28/13 1445  . [START ON 09/30/2013] sertraline (ZOLOFT) tablet 100 mg  100 mg Oral Daily Nehemiah Settle, MD      . thiamine (VITAMIN B-1) tablet 100 mg  100 mg Oral Daily Nehemiah Settle, MD   100 mg at  09/29/13 0804  . traZODone (DESYREL) tablet 50 mg  50 mg Oral QHS Nehemiah Settle, MD   50 mg at 09/28/13 2105    Lab Results: No results found for this or any previous visit (from the past 48 hour(s)).  Physical Findings: AIMS: Facial and Oral Movements Muscles of Facial Expression: None, normal Lips and Perioral Area: None, normal Jaw: None, normal Tongue: None, normal,Extremity Movements Upper (arms, wrists, hands, fingers): None, normal Lower (legs,  knees, ankles, toes): None, normal, Trunk Movements Neck, shoulders, hips: None, normal, Overall Severity Severity of abnormal movements (highest score from questions above): None, normal Incapacitation due to abnormal movements: None, normal Patient's awareness of abnormal movements (rate only patient's report): No Awareness, Dental Status Current problems with teeth and/or dentures?: No Does patient usually wear dentures?: No  CIWA:  CIWA-Ar Total: 2 COWS:     Treatment Plan Summary: Daily contact with patient to assess and evaluate symptoms and progress in treatment Medication management  Plan: Treatment Plan/Recommendations:   1. Admit for crisis management and stabilization. 2. Medication management to reduce current symptoms to base line and improve the patient's overall level of functioning. 3. Treat health problems as indicated. 4. Develop treatment plan to decrease risk of relapse upon discharge and to reduce the need for readmission. 5. Psycho-social education regarding relapse prevention and self care. 6. Health care follow up as needed for medical problems. 7. Restart home medications where appropriate. 8. Will discuss in am regarding disposition plans. May be discharged in three days if she gets detox treatment as planned and show clinical improvement.    Medical Decision Making Problem Points:  Established problem, worsening (2), Review of last therapy session (1) and Review of psycho-social stressors (1) Data Points:  Review or order clinical lab tests (1) Review or order medicine tests (1) Review of medication regiment & side effects (2) Review of new medications or change in dosage (2)  I certify that inpatient services furnished can reasonably be expected to improve the patient's condition.   Nehemiah Settle., MD 09/29/2013, 1:39 PM

## 2013-09-29 NOTE — Progress Notes (Signed)
Patient has been up and active on the unit. Patient attended group this evening and participated. Writer spoke with patient 1:1 and she c/o of feeling anxious and a little agitated. Patient was medicated for withdrawal symptoms. Writer encouraged patient to find something that would keep her active away from home in order to distract her from drinking. Patient reports that she normally drinks at home and has also drank at work. Patient was receptive. Safety maintained with 15 min checks. Patient denies si/hi/a/v hallucinations.

## 2013-09-30 ENCOUNTER — Other Ambulatory Visit (HOSPITAL_COMMUNITY): Payer: BC Managed Care – PPO

## 2013-09-30 NOTE — Progress Notes (Signed)
Patient ID: Sara [name], female   DOB: Mar 26, 1976, 37 y.o.   MRN: [id number] [facility] MD Progress Note  09/30/2013 12:37 PM Sara [name]  MRN:  [id number]  Subjective: Met with [name] 1:1 to discuss her progress. She states she is "Not doing well." But is rather vague about her symptoms. She reports chills, body aches, but states her tremors have resolved.  She also states her depression is low at 1/10 but her anxiety is 9/10.  Her CIWA is a 3.  She is taking her medication as noted and reports no side effects.  She states she is attending groups.  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 Negative for fever, weight loss, malaise/fatigue and diaphoresis.  HENT: Negative for congestion and sore throat.   Eyes: Negative for blurred vision, double vision and photophobia.  Respiratory: Negative for cough, shortness of breath and wheezing.   Cardiovascular: Negative for chest pain, palpitations and PND.  Gastrointestinal: Negative for heartburn, nausea, vomiting, abdominal pain, diarrhea and constipation.  Musculoskeletal: Negative for falls, joint pain and myalgias.  Neurological: Negative for dizziness, tingling, tremors, sensory change, speech change, focal weakness, seizures, loss of consciousness, weakness and headaches.  Endo/Heme/Allergies: Negative for polydipsia. Does not bruise/bleed easily.  Psychiatric/Behavioral: Negative for  depression, suicidal ideas, hallucinations, memory loss and substance abuse. The patient is not nervous/anxious and does not have insomnia.     Blood pressure 100/66, pulse 78, temperature 98.4 F (36.9 C), temperature source Oral, resp. rate 20, height 5\' 1"  (1.549 m), weight 81.647 kg (180 lb), last menstrual period 05/27/2013.Body mass index is 34.03 kg/(m^2).  General Appearance: Fairly Groomed  Patent attorney::  Minimal  Speech:  Clear and Coherent and Slow  Volume:  Decreased  Mood:  Angry, Anxious, Depressed, Hopeless and Worthless  Affect:  Depressed and Flat  Thought Process:  Goal Directed and Intact  Orientation:  Full (Time, Place, and Person)  Thought Content:  WDL  Suicidal Thoughts:  No  Homicidal Thoughts:  No  Memory:  Immediate;   Fair  Judgement:  Impaired  Insight:  Lacking  Psychomotor Activity:  Psychomotor Retardation  Concentration:  Fair  Recall:  Fair  Akathisia:  NA  Handed:  Right  AIMS (if indicated):     Assets:  Communication Skills Desire for Improvement Financial Resources/Insurance Housing Physical Health Resilience Social Support Transportation  Sleep:  Number of Hours: 6.75   Current Medications: Current Facility-Administered Medications  Medication Dose Route Frequency Provider Last Rate Last Dose  . chlordiazePOXIDE (LIBRIUM) capsule 25 mg  25 mg Oral Q6H PRN Nehemiah Settle, MD   25 mg at 09/29/13 2130  . [START ON 10/01/2013] chlordiazePOXIDE (LIBRIUM) capsule 25 mg  25 mg Oral Daily Nehemiah Settle, MD      . hydrOXYzine (ATARAX/VISTARIL) tablet 25 mg  25 mg Oral Q6H PRN Nehemiah Settle, MD   25 mg at 09/30/13 0835  . loperamide (IMODIUM) capsule 2-4 mg  2-4 mg Oral PRN Nehemiah Settle, MD      .  multivitamin with minerals tablet 1 tablet  1 tablet Oral Daily Nehemiah Settle, MD   1 tablet at 09/30/13 0831  . ondansetron (ZOFRAN-ODT) disintegrating tablet 4 mg  4 mg Oral Q6H PRN  Nehemiah Settle, MD   4 mg at 09/28/13 1445  . sertraline (ZOLOFT) tablet 100 mg  100 mg Oral Daily Nehemiah Settle, MD   100 mg at 09/30/13 0831  . thiamine (VITAMIN B-1) tablet 100 mg  100 mg Oral Daily Nehemiah Settle, MD   100 mg at 09/30/13 0831  . traZODone (DESYREL) tablet 50 mg  50 mg Oral QHS Nehemiah Settle, MD   50 mg at 09/29/13 2130    Lab Results: No results found for this or any previous visit (from the past 48 hour(s)).  Physical Findings: AIMS: Facial and Oral Movements Muscles of Facial Expression: None, normal Lips and Perioral Area: None, normal Jaw: None, normal Tongue: None, normal,Extremity Movements Upper (arms, wrists, hands, fingers): None, normal Lower (legs, knees, ankles, toes): None, normal, Trunk Movements Neck, shoulders, hips: None, normal, Overall Severity Severity of abnormal movements (highest score from questions above): None, normal Incapacitation due to abnormal movements: None, normal Patient's awareness of abnormal movements (rate only patient's report): No Awareness, Dental Status Current problems with teeth and/or dentures?: No Does patient usually wear dentures?: No  CIWA:  CIWA-Ar Total: 3 COWS:     Treatment Plan Summary: Daily contact with patient to assess and evaluate symptoms and progress in treatment Medication management  Plan: Treatment Plan/Recommendations:   1. Admit for crisis management and stabilization. 2. Medication management to reduce current symptoms to base line and improve the patient's overall level of functioning. 3. Treat health problems as indicated. 4. Develop treatment plan to decrease risk of relapse upon discharge and to reduce the need for readmission. 5. Psycho-social education regarding relapse prevention and self care. 6. Health care follow up as needed for medical problems. 7. Restart home medications where appropriate. 8. Will discuss in am regarding disposition  plans. May be discharged in three days if she gets detox treatment as planned and show clinical improvement.  9. ELOS: D/C in AM if clinically stable.  Medical Decision Making Problem Points:  Established problem, worsening (2), Review of last therapy session (1) and Review of psycho-social stressors (1) Data Points:  Review or order clinical lab tests (1) Review or order medicine tests (1) Review of medication regiment & side effects (2) Review of new medications or change in dosage (2)  I certify that inpatient services furnished can reasonably be expected to improve the patient's condition.  Rona Ravens. Mashburn RPAC 12:38 PM 09/30/2013  Reviewed the information documented and agree with the treatment plan.  Keyonda Bickle,JANARDHAHA R. 10/01/2013 4:31 PM

## 2013-09-30 NOTE — Progress Notes (Signed)
Adult Psychoeducational Group Note  Date:  09/30/2013 Time:  8:59 PM  Group Topic/Focus:  Wrap-Up Group:   The focus of this group is to help patients review their daily goal of treatment and discuss progress on daily workbooks.  Participation Level:  Active  Participation Quality:  Appropriate  Affect:  Appropriate  Cognitive:  Appropriate  Insight: Appropriate  Engagement in Group:  Engaged  Modes of Intervention:  Support  Additional Comments:  Pt stated that she has a good night rest and that her withdrawal symptoms are getting better and she got to see her younger sister who is also her support system. Pt was given support.   Aadil Sur 09/30/2013, 8:59 PM

## 2013-09-30 NOTE — Tx Team (Signed)
Interdisciplinary Treatment Plan Update (Adult)  Date: 09/30/2013  Time Reviewed:  9:45 AM  Progress in Treatment: Attending groups: Yes Participating in groups:  Yes Taking medication as prescribed:  Yes Tolerating medication:  Yes Family/Significant othe contact made: CSW assessing  Patient understands diagnosis:  Yes Discussing patient identified problems/goals with staff:  Yes Medical problems stabilized or resolved:  Yes Denies suicidal/homicidal ideation: Yes Issues/concerns per patient self-inventory:  Yes Other:  New problem(s) identified: N/A  Discharge Plan or Barriers: CSW assessing for appropriate referrals.  Reason for Continuation of Hospitalization: Anxiety Depression Detox Medication Stabilization  Comments: N/A  Estimated length of stay: 3-5 days  For review of initial/current patient goals, please see plan of care.  Attendees: Patient:    Family:     Physician:  Dr. Javier Glazier 09/30/2013 9:51 AM   Nursing:   Quintella Reichert, RN 09/30/2013 9:51 AM   Clinical Social Worker:  Reyes Ivan, LCSW 09/30/2013 9:51 AM   Other: Verne Spurr, PA 09/30/2013 9:51 AM   Other:  Frankey Shown, MA care coordination 09/30/2013 9:51 AM   Other:  Juline Patch, LCSW 09/30/2013 9:51 AM   Other:  Marzetta Board, RN 09/30/2013 9:51 AM   Other: Neill Loft, RN 09/30/2013 9:51 AM   Other:    Other:    Other:    Other:      Scribe for Treatment Team:   Carmina Miller, 09/30/2013 , 9:51 AM

## 2013-09-30 NOTE — [facility] Suicide Risk Assessment (Signed)
[facility] INPATIENT: Family/Significant Other Suicide Prevention Education  Suicide Prevention Education:  Education Completed; No one has been identified by the patient as the family member/significant other with whom the patient will be residing, and identified as the person(s) who will aid the patient in the event of a mental health crisis (suicidal ideations/suicide attempt). With written consent from the patient, the family member/significant other has been provided the following suicide prevention education, prior to the and/or following the discharge of the patient.  The suicide prevention education provided includes the following:  Suicide risk factors  Suicide prevention and interventions  National Suicide Hotline telephone number  [facility] assessment telephone number  [facility] Emergency Assistance 911  [facility] and/or Residential Mobile Crisis Unit telephone number Request made of family/significant other to:  Remove weapons (e.g., guns, rifles, knives), all items previously/currently identified as safety concern.  Remove drugs/medications (over-the-counter, prescriptions, illicit drugs), all items previously/currently identified as a safety concern. The family member/significant other verbalizes understanding of the suicide prevention education information provided. The family member/significant other agrees to remove the items of safety concern listed above. Pt did not c/o SI at admission, nor have they endorsed SI during their stay here. SPE not required.   Reyes Ivan, LCSW 09/30/2013  2:07 PM

## 2013-09-30 NOTE — Progress Notes (Signed)
Recreation Therapy Notes  Date: 11.10.2014 Time: 3:00pm Location: [address] Dayroom  Group Topic: Communication, Team Building, Problem Solving  Goal Area(s) Addresses:  Patient will effectively work with peer towards shared goal.  Patient will identify skill used to make activity successful.  Patient will identify how skills used during activity can be used to reach post d/c goals.   Behavioral Response: Engaged, Attentive, Appropriate  Intervention: Problem Solving Activitiy  Activity: Life Boat. Patients were given a scenario about being on a sinking yacht. Patients were informed the yacht included 15 guest, 8 of which could be placed on the life boat, along with all group members. Individuals on guest list were of varying socioeconomic classes such as a Education officer, museum, Materials engineer, Midwife, Tree surgeon.   Education: Customer service manager, Discharge Planning   Education Outcome: Acknowledges understanding  Clinical Observations/Feedback: Patient actively engaged in group activity, voicing her opinion and debating with peers appropriately. Patient made no contributions to group discussion, but appeared to actively listen as she maintained appropriate eye contact with speaker.   Marykay Lex Alvar Malinoski, LRT/CTRS  Phelan Goers L 09/30/2013 4:00 PM

## 2013-09-30 NOTE — [facility] Group Notes (Signed)
[facility] LCSW Aftercare Discharge Planning Group Note   09/30/2013 8:45 AM  Participation Quality:  Alert and Appropriate   Mood/Affect:  Appropriate, Fat and Depressed  Depression Rating:  1  Anxiety Rating:  8  Thoughts of Suicide:  Pt denies SI/HI  Will you contract for safety?   Yes  Current AVH:  Pt denies  Plan for Discharge/Comments:  Pt attended discharge planning group and actively participated in group.  CSW provided pt with today's workbook.  Pt states that she was in [facility] for 1.5 week before coming here.  Pt states that she feels she needs further inpatient treatment.  CSW will assess for appropriate referrals.  Pt lives in [place].  No further needs voiced by pt at this time.    Transportation Means: Pt reports access to transportation  Supports: No supports mentioned at this time  Reyes Ivan, LCSW 09/30/2013 9:27 AM

## 2013-09-30 NOTE — [facility] Group Notes (Signed)
[facility] LCSW Group Therapy  09/30/2013  1:15 PM   Type of Therapy:  Group Therapy  Participation Level:  Active  Participation Quality:  Appropriate and Attentive  Affect:  Appropriate, Flat and Depressed  Cognitive:  Alert and Appropriate  Insight:  Developing/Improving and Engaged  Engagement in Therapy:  Developing/Improving and Engaged  Modes of Intervention:  Clarification, Confrontation, Discussion, Education, Exploration, Limit-setting, Orientation, Problem-solving, Rapport Building, Dance movement psychotherapist, Socialization and Support  Summary of Progress/Problems: Pt identified obstacles faced currently and processed barriers involved in overcoming these obstacles. Pt identified steps necessary for overcoming these obstacles and explored motivation (internal and external) for facing these difficulties head on. Pt further identified one area of concern in their lives and chose a goal to focus on for today.  Pt shared that her biggest obstacle is overcoming her alcohol use.  Pt states that she was alone at home, not working, her kids were at school and her husband was at work and she would drink to cope.  Pt states that she's been drinking for 11 years and doesn't understand why she doesn't have the desire to stop drinking, despite having cirrhosis.  Pt states that she does want to stop drinking for her kids but knows she needs further inpatient treatment after this.  PT actively participated and was engaged in group discussion.    Sara Ivan, LCSW 09/30/2013 3:03 PM

## 2013-09-30 NOTE — Progress Notes (Signed)
Patient ID: Sara [name], female   DOB: [birth year], 37 y.o.   MRN: [id number]  D: Patient has flat affect upon approach and depressed mood. Patient denies pain but complains of anxiety and agitation related to patient's withdrawal symptoms. Patient denies SI/HI and A/V hallucinations. Patient rated hers depression and hopelessness at 1/10 (1-10/10 scale). Writer asked patient if she had ever detoxed from alcohol and patient stated no. Patient stated that she felt "jittery."  A: Encouragement and support were given to patient. Patient was given scheduled medication and PRN Vistaril. Patient is seen in the milieu and attending groups. Patient filled out daily pt inventory sheet.  R: Patient states that the Vistaril helped her in relation to her anxiety. Patient is pleasant and cooperative with Clinical research associate. Q15 minute safety checks are maintained and patient is safe at this time.

## 2013-09-30 NOTE — Consult Note (Signed)
Pt was interviewed with NP. Agree with above assessment and plan.  Ancil Linsey, MD

## 2013-10-01 MED ORDER — HYDROXYZINE HCL 50 MG PO TABS
50.0000 mg | ORAL_TABLET | Freq: Three times a day (TID) | ORAL | Status: AC | PRN
  Administered 2013-10-01 – 2013-10-03 (×5): 50 mg via ORAL
  Filled 2013-10-01 (×5): qty 1

## 2013-10-01 MED ORDER — ACAMPROSATE CALCIUM 333 MG PO TBEC
666.0000 mg | DELAYED_RELEASE_TABLET | Freq: Three times a day (TID) | ORAL | Status: DC
  Administered 2013-10-01 – 2013-10-03 (×6): 666 mg via ORAL
  Filled 2013-10-01 (×8): qty 2

## 2013-10-01 NOTE — Progress Notes (Signed)
Patient ID: Sara [name], female   DOB: [birth year], 37 y.o.   MRN: [id number] [facility] MD Progress Note  10/01/2013 3:01 PM Sara [name]  MRN:  [id number]  Subjective: Met with Sara [name] 1:1 to discuss her progress. Sara [name] has finished her Librium protocol but continues to note increased anxiety and alcohol cravings. She has never tried Campral. She is also stating that she is interested in residential treatment and [facility] in [place]. She states she has no SI/HI but her anxiety is a 9/10. She reports no depression and is reporting only anxiety and tremors for withdrawal symptoms.   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 Negative for fever, weight loss, malaise/fatigue and diaphoresis.  HENT: Negative for congestion and sore throat.   Eyes: Negative for blurred vision, double vision and photophobia.  Respiratory: Negative for cough, shortness of breath and wheezing.   Cardiovascular: Negative for chest pain, palpitations and PND.  Gastrointestinal: Negative for heartburn, nausea, vomiting, abdominal pain, diarrhea and constipation.  Musculoskeletal: Negative for falls, joint pain and myalgias.  Neurological: Negative for dizziness, tingling, tremors, sensory change, speech change, focal weakness, seizures, loss of consciousness, weakness and headaches.  Endo/Heme/Allergies: Negative for  polydipsia. Does not bruise/bleed easily.  Psychiatric/Behavioral: Negative for depression, suicidal ideas, hallucinations, memory loss and substance abuse. The patient is not nervous/anxious and does not have insomnia.     Blood pressure 101/66, pulse 88, temperature 98.2 F (36.8 C), temperature source Oral, resp. rate 16, height 5\' 1"  (1.549 m), weight 81.647 kg (180 lb), last menstrual period 05/27/2013.Body mass index is 34.03 kg/(m^2).  General Appearance: Fairly Groomed  Patent attorney::  Minimal  Speech:  Clear and Coherent and Slow  Volume:  Decreased  Mood:  Angry, Anxious, Depressed, Hopeless and Worthless  Affect:  Depressed and Flat  Thought Process:  Goal Directed and Intact  Orientation:  Full (Time, Place, and Person)  Thought Content:  WDL  Suicidal Thoughts:  No  Homicidal Thoughts:  No  Memory:  Immediate;   Fair  Judgement:  Impaired  Insight:  Lacking  Psychomotor Activity:  Psychomotor Retardation  Concentration:  Fair  Recall:  Fair  Akathisia:  NA  Handed:  Right  AIMS (if indicated):     Assets:  Communication Skills Desire for Improvement Financial Resources/Insurance Housing Physical Health Resilience Social Support Transportation  Sleep:  Number of Hours: 6.25   Current Medications: Current Facility-Administered Medications  Medication Dose Route Frequency Provider Last Rate Last Dose  . acamprosate (CAMPRAL) tablet 666 mg  666 mg Oral TID WC Verne Spurr, PA-C      . hydrOXYzine (ATARAX/VISTARIL) tablet 50 mg  50 mg Oral TID PRN Verne Spurr, PA-C      . multivitamin with minerals tablet 1 tablet  1 tablet Oral Daily Nehemiah Settle, MD   1 tablet at 10/01/13 0800  . sertraline (ZOLOFT) tablet 100 mg  100 mg Oral Daily Nehemiah Settle, MD   100 mg at 10/01/13 0800  .  thiamine (VITAMIN B-1) tablet 100 mg  100 mg Oral Daily Nehemiah Settle, MD   100 mg at 10/01/13 0800  . traZODone (DESYREL) tablet 50 mg  50 mg Oral  QHS Nehemiah Settle, MD   50 mg at 09/30/13 2123    Lab Results: No results found for this or any previous visit (from the past 48 hour(s)).  Physical Findings: AIMS: Facial and Oral Movements Muscles of Facial Expression: None, normal Lips and Perioral Area: None, normal Jaw: None, normal Tongue: None, normal,Extremity Movements Upper (arms, wrists, hands, fingers): None, normal Lower (legs, knees, ankles, toes): None, normal, Trunk Movements Neck, shoulders, hips: None, normal, Overall Severity Severity of abnormal movements (highest score from questions above): None, normal Incapacitation due to abnormal movements: None, normal Patient's awareness of abnormal movements (rate only patient's report): No Awareness, Dental Status Current problems with teeth and/or dentures?: No Does patient usually wear dentures?: No  CIWA:  CIWA-Ar Total: 1 COWS:     Treatment Plan Summary: Daily contact with patient to assess and evaluate symptoms and progress in treatment Medication management  Plan: Treatment Plan/Recommendations:   1.crisis management and stabilization. 2. Medication management to reduce current symptoms to base line and improve the patient's overall level of functioning. 3. Treat health problems as indicated. 4. Develop treatment plan to decrease risk of relapse upon discharge and to reduce the need for readmission. 5. Psycho-social education regarding relapse prevention and self care. 6. Health care follow up as needed for medical problems. 7. Restart home medications where appropriate. 8. Will discuss in am regarding disposition plans. May be discharged in three days if she gets detox treatment as planned and show clinical improvement.  9.l Added Campral 333mg  po TID for cravings. 10. Added Vistaril 50mg  po TID prn for 5 doses. [name] ELOS: D/C in AM if clinically stable.  Medical Decision Making Problem Points:  Established problem, worsening (2), Review of last  therapy session (1) and Review of psycho-social stressors (1) Data Points:  Review or order clinical lab tests (1) Review or order medicine tests (1) Review of medication regiment & side effects (2) Review of new medications or change in dosage (2)  I certify that inpatient services furnished can reasonably be expected to improve the patient's condition.  Rona Ravens. Mashburn RPAC 3:01 PM 10/01/2013 Agree with assessment and plan Madie Reno A. Dub Mikes, M.D.

## 2013-10-01 NOTE — Progress Notes (Signed)
Adult Psychoeducational Group Note  Date:  10/01/2013 Time:  11:00am Group Topic/Focus:  Recovery Goals:   The focus of this group is to identify appropriate goals for recovery and establish a plan to achieve them.  Participation Level:  Active  Participation Quality:  Appropriate and Attentive  Affect:  Appropriate  Cognitive:  Alert and Appropriate  Insight: Appropriate  Engagement in Group:  Engaged  Modes of Intervention:  Discussion and Education  Additional Comments:  Pt attended and participated in group. Discussion today was on Recovery. The question was asked what does recovery mean to you? Pt stated recovery means having goals and a positive support group.  Shelly Bombard D 10/01/2013, 12:55 PM

## 2013-10-01 NOTE — [facility] Group Notes (Signed)
[facility] LCSW Group Therapy  10/01/2013  1:15 PM   Type of Therapy:  Group Therapy  Participation Level:  Active  Participation Quality:  Appropriate and Attentive  Affect:  Appropriate  Cognitive:  Alert and Appropriate  Insight:  Developing/Improving and Engaged  Engagement in Therapy:  Developing/Improving and Engaged  Modes of Intervention:  Activity, Clarification, Confrontation, Discussion, Education, Exploration, Limit-setting, Orientation, Problem-solving, Rapport Building, Dance movement psychotherapist, Socialization and Support  Summary of Progress/Problems: MHA speaker did not come to group today. CSW educated patients about various community resources Conservator, museum/gallery, upcoming dental clinic, and [employer]. CSW reviewed various groups and services offered by [facility]. Pt was attentive to group discussion on how these support groups and resources are beneficial wrap around services, in addition to their follow up.  Pt invited to ask questions or discuss any experiences with these services and share this information with other group members.   Reyes Ivan, LCSW 10/01/2013 2:25 PM

## 2013-10-01 NOTE — Progress Notes (Addendum)
The focus of this group is to educate the patient on the purpose and policies of crisis stabilization and provide a format to answer questions about their admission.  The group details unit policies and expectations of patients while admitted.  Patient attended 0900 nurse education orientation group this morning.  Patient actively participated, appropriate affect, alert, appropriate insight and engagement.  Today patient will work on 3 goals for discharge.  

## 2013-10-01 NOTE — Progress Notes (Signed)
Adult Psychoeducational Group Note  Date:  10/01/2013 Time:  8:00pm  Group Topic/Focus:  Wrap-Up Group:   The focus of this group is to help patients review their daily goal of treatment and discuss progress on daily workbooks.  Participation Level:  Active  Participation Quality:  Appropriate and Sharing  Affect:  Appropriate  Cognitive:  Appropriate  Insight: Appropriate  Engagement in Group:  Engaged  Modes of Intervention:  Discussion, Education, Socialization and Support  Additional Comments:  Pt stated that she is in the hospital for alcohol detox. Pt stated that she is optimistic and detailed oriented.   Laural Benes, Garvey Westcott 10/01/2013, 11:57 PM

## 2013-10-01 NOTE — Progress Notes (Signed)
Recreation Therapy Notes  Date: 11.11.2014 Time: 2:45pm Location: [address] Dayroom  Group Topic: Animal Assisted Activities (AAA)  Behavioral Response: Did not attend.   Rhegan Trunnell L Scottie Stanish, LRT/CTRS  Ronica Vivian L 10/01/2013 5:19 PM 

## 2013-10-01 NOTE — Progress Notes (Signed)
D: Pt mood is depressed. Eye contact is fair. She has been attending groups and but interacts minimally with peers. A: Support given. Verbalization encouraged. Medications given as prescribed. Pt encouraged to come to nurse with any concerns.  R: Pt is receptive. No complaints of pain or discomfort at this time. Q15 min safety checks maintained. Will continue to monitor pt.

## 2013-10-01 NOTE — Progress Notes (Addendum)
D: Pt is appropriate in affect and mood. She reports some ongoing anxiety related to . Pt attended group this evening. Pt observed interacting appropriately within the milieu. Pt is reporting chills and anxiety related to her anxiety.  A: Writer administered scheduled and prn medications to pt. Writer informed about vistaril indications and the one relative to her regimen. Continued support and availability as needed was extended to this pt. Staff continue to monitor pt with q19min checks.  R: No adverse drug reactions noted. Pt receptive to treatment. Pt remains safe at this time.

## 2013-10-01 NOTE — Progress Notes (Signed)
D: Patient denies SI/HI and A/V hallucinations; patient reports sleep is well; reports appetite is improving ; reports energy level is normal ; reports ability to pay attention is poor; rates depression as -1/10; rates hopelessness 1/10; rates anxiety as 7/10; patient is reporting some tremors still  A: Monitored q 15 minutes; patient encouraged to attend groups; patient educated about medications; patient given medications per physician orders; patient encouraged to express feelings and/or concerns  R: Patient is flat in affect but appropriate to circumstances; patient's interaction with staff and peers is appropriate; patient was able to set goal to talk with staff 1:1 when having feelings of SI; patient is taking medications as prescribed and tolerating medications; patient is attending all groups

## 2013-10-02 ENCOUNTER — Other Ambulatory Visit (HOSPITAL_COMMUNITY): Payer: BC Managed Care – PPO

## 2013-10-02 NOTE — Progress Notes (Signed)
Adult Psychoeducational Group Note  Date:  10/02/2013 Time:  10:00am Group Topic/Focus:  Therapeutic Activity  Participation Level:  Minimal  Participation Quality:  Appropriate and Attentive  Affect:  Appropriate  Cognitive:  Alert and Appropriate  Insight: Appropriate and Good  Engagement in Group:  Engaged  Modes of Intervention:  Discussion and Education  Additional Comments:  Pt attended and participated in group. Discussion was on telling of a happy time in your life. Pt stated her happy time was a family vacation on [facility].  Shelly Bombard D 10/02/2013, 11:41 AM

## 2013-10-02 NOTE — BHH Group Notes (Signed)
[facility] LCSW Aftercare Discharge Planning Group Note   10/02/2013 8:45 AM  Participation Quality:  Alert and Appropriate   Mood/Affect:  Appropriate, Flat and Depressed  Depression Rating:  0  Anxiety Rating:  10  Thoughts of Suicide:  Pt denies SI/HI  Will you contract for safety?   Yes  Current AVH:  Pt denies  Plan for Discharge/Comments:  Pt attended discharge planning group and actively participated in group.  CSW provided pt with today's workbook.  Pt is still interested in going straight to inpatient treatment from here.  CSW is working on the referral to [facility], but a bed may not be available until 11/22.  Pt agreed to the referral to [facility] [place] for further inpatient referral.  CSW will make this referral today.  Pt admits that she is afraid if she goes home after here she will relapse.  No further needs voiced by pt at this time.    Transportation Means: Pt reports access to transportation  Supports: No supports mentioned at this time  Reyes Ivan, LCSW 10/02/2013 9:52 AM

## 2013-10-02 NOTE — Progress Notes (Signed)
Adult Psychoeducational Group Note  Date:  10/02/2013 Time:  2:07 PM  Group Topic/Focus:  Crisis Planning:   The purpose of this group is to help patients create a crisis plan for use upon discharge or in the future, as needed.  Participation Level:  Minimal  Participation Quality:  Appropriate and Attentive  Affect:  Flat  Cognitive:  Alert, Appropriate and Oriented  Insight: Appropriate  Engagement in Group:  Engaged and Supportive  Modes of Intervention:  Discussion and Education  Additional Comments:  Pt attended morning Psychoeducational skills group with peers. Goal of group was to outline the aspects of an effective crisis plan. Pt was able to identify need to have support in the event of a crisis to help her enact her safety plan.  Orma Render 10/02/2013, 2:07 PM

## 2013-10-02 NOTE — Progress Notes (Signed)
D: Patient denies SI/HI and A/V hallucinations; patient reports sleep is fair; reports appetite is improving ; reports energy level is normal; reports ability to pay attention is poor; rates depression as 1/10; rates hopelessness 1/10;   A: Monitored q 15 minutes; patient encouraged to attend groups; patient educated about medications; patient given medications per physician orders; patient encouraged to express feelings and/or concerns  R: Patient still having complaints of anxiety and reporting moderate relief; patient's interaction with staff and peers is appropriate; patient is quiet but assertive; patient was able to set goal to talk with staff 1:1 when having feelings of SI; patient is taking medications as prescribed and tolerating medications; patient is attending all groups and engaging

## 2013-10-02 NOTE — Progress Notes (Signed)
Adult Psychoeducational Group Note  Date:  10/02/2013 Time:  9:22 PM  Group Topic/Focus:  Wrap-Up Group:   The focus of this group is to help patients review their daily goal of treatment and discuss progress on daily workbooks.  Participation Level:  Active  Participation Quality:  Appropriate  Affect:  Appropriate  Cognitive:  Appropriate  Insight: Appropriate  Engagement in Group:  Engaged  Modes of Intervention:  Support  Additional Comments:  Pt stated that one good thing tthat happened today is that she learned that she is going home tomorrow and that she then [facility]. Pt was given support and encouragement.   Letecia Arps 10/02/2013, 9:22 PM

## 2013-10-02 NOTE — Tx Team (Signed)
Interdisciplinary Treatment Plan Update (Adult)  Date: 10/02/2013  Time Reviewed:  9:45 AM  Progress in Treatment: Attending groups: Yes Participating in groups:  Yes Taking medication as prescribed:  Yes Tolerating medication:  Yes Family/Significant othe contact made: No, n/a Patient understands diagnosis:  Yes Discussing patient identified problems/goals with staff:  Yes Medical problems stabilized or resolved:  Yes Denies suicidal/homicidal ideation: Yes Issues/concerns per patient self-inventory:  Yes Other:  New problem(s) identified: N/A  Discharge Plan or Barriers: CSW is working on referral for further inpatient treatment to [facility] or [facility] [place]  Reason for Continuation of Hospitalization: Anxiety Depression Medication Stabilization Referral  Comments: N/A  Estimated length of stay: 1-2 days  For review of initial/current patient goals, please see plan of care.  Attendees: Patient:     Family:     Physician:  Dr. Javier Glazier 10/02/2013 10:11 AM   Nursing:   Harold Barban, RN 10/02/2013 10:11 AM   Clinical Social Worker:  Reyes Ivan, LCSW 10/02/2013 10:11 AM   Other: Verne Spurr, PA 10/02/2013 10:11 AM   Other:  Frankey Shown, MA care coordination 10/02/2013 10:11 AM   Other:  Juline Patch, LCSW 10/02/2013 10:11 AM   Other:  Burnetta Sabin, RN 10/02/2013 10:11 AM   Other: Marzetta Board, RN 10/02/2013 10:11 AM   Other: Onnie Boer, RN case manager 10/02/2013 10:11 AM   Other:    Other:    Other:    Other:     Scribe for Treatment Team:   Carmina Miller, 10/02/2013 10:11 AM

## 2013-10-02 NOTE — Progress Notes (Signed)
Patient ID: Sara [name], female   DOB: 09-11-1976, 37 y.o.   MRN: [id number] [facility] MD Progress Note  10/02/2013 4:08 PM Terrel Manalo  MRN:  [id number]  Subjective: Evelena has complained feeling nervousness and anxious about going to the rehabilitation treatment and living her children and family members at home. Patient reported her family and herself wants to be in rehabilitation treatment center. Patient has successfully completed Librium protocol but continues to note increased anxiety and alcohol cravings. She is also stating that she is interested in residential treatment and would be [facility] in [place]. She has no SI/HI but her anxiety is a 9/10 and asking for medication to help. She reports no depression. 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 Negative for fever, weight loss, malaise/fatigue and diaphoresis.  HENT: Negative for congestion and sore throat.   Eyes: Negative for blurred vision, double vision and photophobia.  Respiratory: Negative for cough, shortness of breath and wheezing.   Cardiovascular: Negative for chest pain, palpitations and PND.  Gastrointestinal: Negative for heartburn, nausea, vomiting, abdominal pain, diarrhea and constipation.  Musculoskeletal: Negative for falls, joint pain and myalgias.  Neurological:  Negative for dizziness, tingling, tremors, sensory change, speech change, focal weakness, seizures, loss of consciousness, weakness and headaches.  Endo/Heme/Allergies: Negative for polydipsia. Does not bruise/bleed easily.  Psychiatric/Behavioral: Negative for depression, suicidal ideas, hallucinations, memory loss and substance abuse. The patient is not nervous/anxious and does not have insomnia.     Blood pressure 106/70, pulse 92, temperature 98 F (36.7 C), temperature source Oral, resp. rate 16, height 5\' 1"  (1.549 m), weight 81.647 kg (180 lb), last menstrual period 05/27/2013.Body mass index is 34.03 kg/(m^2).  General Appearance: Fairly Groomed  Patent attorney::  Minimal  Speech:  Clear and Coherent and Slow  Volume:  Decreased  Mood:  Angry, Anxious, Depressed, Hopeless and Worthless  Affect:  Depressed and Flat  Thought Process:  Goal Directed and Intact  Orientation:  Full (Time, Place, and Person)  Thought Content:  WDL  Suicidal Thoughts:  No  Homicidal Thoughts:  No  Memory:  Immediate;   Fair  Judgement:  Impaired  Insight:  Lacking  Psychomotor Activity:  Psychomotor Retardation  Concentration:  Fair  Recall:  Fair  Akathisia:  NA  Handed:  Right  AIMS (if indicated):     Assets:  Communication Skills Desire for Improvement Financial Resources/Insurance Housing Physical Health Resilience Social Support Transportation  Sleep:  Number of Hours: 6.25   Current Medications: Current Facility-Administered Medications  Medication Dose Route Frequency Provider Last Rate Last Dose  . acamprosate (CAMPRAL) tablet 666 mg  666 mg Oral TID WC Verne Spurr, PA-C   666 mg at 10/02/13 1201  . hydrOXYzine (ATARAX/VISTARIL) tablet 50 mg  50 mg Oral TID PRN Verne Spurr, PA-C   50 mg at 10/02/13 0810  . multivitamin with minerals tablet 1 tablet  1 tablet Oral Daily Nehemiah Settle, MD  1 tablet at 10/02/13 0808  . sertraline (ZOLOFT) tablet 100 mg  100 mg Oral Daily  Nehemiah Settle, MD   100 mg at 10/02/13 1610  . thiamine (VITAMIN B-1) tablet 100 mg  100 mg Oral Daily Nehemiah Settle, MD   100 mg at 10/02/13 9604  . traZODone (DESYREL) tablet 50 mg  50 mg Oral QHS Nehemiah Settle, MD   50 mg at 10/01/13 2116    Lab Results: No results found for this or any previous visit (from the past 48 hour(s)).  Physical Findings: AIMS: Facial and Oral Movements Muscles of Facial Expression: None, normal Lips and Perioral Area: None, normal Jaw: None, normal Tongue: None, normal,Extremity Movements Upper (arms, wrists, hands, fingers): None, normal Lower (legs, knees, ankles, toes): None, normal, Trunk Movements Neck, shoulders, hips: None, normal, Overall Severity Severity of abnormal movements (highest score from questions above): None, normal Incapacitation due to abnormal movements: None, normal Patient's awareness of abnormal movements (rate only patient's report): No Awareness, Dental Status Current problems with teeth and/or dentures?: No Does patient usually wear dentures?: No  CIWA:  CIWA-Ar Total: 0 COWS:     Treatment Plan Summary: Daily contact with patient to assess and evaluate symptoms and progress in treatment Medication management  Plan: Treatment Plan/Recommendations:  1. Crisis management and stabilization. 2. Medication management to reduce current symptoms to base line and improve the patient's overall level of functioning. 3. Treat health problems as indicated. 4. Develop treatment plan to decrease risk of relapse upon discharge and to reduce the need for readmission. 5. Psycho-social education regarding relapse prevention and self care. 6. Health care follow up as needed for medical problems. 7. Restart home medications where appropriate. 8. Will discuss in am regarding disposition plans. May be discharged in three days if she gets detox treatment as planned and show clinical improvement.  9.  continue Campral 333mg  po TID for cravings. 10. Continue Vistaril 50mg  po TID prn for 5 doses. 11. ELOS: D/[facility].  Medical Decision Making Problem Points:  Established problem, worsening (2), Review of last therapy session (1) and Review of psycho-social stressors (1) Data Points:  Review or order clinical lab tests (1) Review or order medicine tests (1) Review of medication regiment & side effects (2) Review of new medications or change in dosage (2)  I certify that inpatient services furnished can reasonably be expected to improve the patient's condition.    Ezekiah Massie,JANARDHAHA R. 10/02/2013 4:12 PM

## 2013-10-02 NOTE — Progress Notes (Signed)
Recreation Therapy Notes  Date: 11.12.2014 Time: 3:00pm Location: [address] Dayroom    Group Topic: Communication, Team Building, Problem Solving  Goal Area(s) Addresses:  Patient will effectively work with peer towards shared goal.  Patient will identify skill used to make activity successful.  Patient will identify how skills used during activity can be used to reach post d/c goals.   Behavioral Response: Observation  Intervention: Problem Solving Activity  Activity: Landing Pad. In teams patients were given 12 plastic drinking straws and a length of masking tape. Using the materials provided patients were asked to build a landing pad to catch a golf ball dropped from approximately 6 feet in the air.   Education: Special educational needs teacher, Team Work, Building control surveyor, Journalist, newspaper.   Education Outcome: Acknowledges understanding   Clinical Observations/Feedback: Patient actively engaged in group activity, offering suggestions and assisting with construction of team's landing pad. Patient identified team work as a Marketing executive used during activity, patient related team work to working within her support system post d/c. Patient additionally defined personal development as bettering ones self and related all three skills to making improvements in her life.   Marykay Lex Joplin Canty, LRT/CTRS  Jearl Klinefelter 10/02/2013 5:17 PM

## 2013-10-02 NOTE — B[facility] Group Notes (Signed)
[facility] LCSW Group Therapy  10/02/2013  1:15 PM   Type of Therapy:  Group Therapy  Participation Level:  Active  Participation Quality:  Appropriate and Attentive  Affect:  Appropriate, Flat and Depressed  Cognitive:  Alert and Appropriate  Insight:  Developing/Improving and Engaged  Engagement in Therapy:  Developing/Improving and Engaged  Modes of Intervention:  Clarification, Confrontation, Discussion, Education, Exploration, Limit-setting, Orientation, Problem-solving, Rapport Building, Dance movement psychotherapist, Socialization and Support  Summary of Progress/Problems: The topic for group today was emotional regulation.  This group focused on both positive and negative emotion identification and allowed group members to process ways to identify feelings, regulate negative emotions, and find healthy ways to manage internal/external emotions. Group members were asked to reflect on a time when their reaction to an emotion led to a negative outcome and explored how alternative responses using emotion regulation would have benefited them. Group members were also asked to discuss a time when emotion regulation was utilized when a negative emotion was experienced.  Pt shared that she drank alcohol to cope with depression and anxiety symptoms.  Pt states that she hears everything in groups and it makes sense to her but when she is at home she drinks anyways.  Pt is hopeful going to further treatment will help her stay clean.  Pt actively participated and was engaged in group discussion.    Reyes Ivan, LCSW 10/02/2013 2:07 PM

## 2013-10-03 DIAGNOSIS — F10239 Alcohol dependence with withdrawal, unspecified: Principal | ICD-10-CM

## 2013-10-03 DIAGNOSIS — F10229 Alcohol dependence with intoxication, unspecified: Secondary | ICD-10-CM

## 2013-10-03 DIAGNOSIS — F1020 Alcohol dependence, uncomplicated: Secondary | ICD-10-CM

## 2013-10-03 DIAGNOSIS — F10939 Alcohol use, unspecified with withdrawal, unspecified: Principal | ICD-10-CM

## 2013-10-03 DIAGNOSIS — F1994 Other psychoactive substance use, unspecified with psychoactive substance-induced mood disorder: Secondary | ICD-10-CM

## 2013-10-03 MED ORDER — ACAMPROSATE CALCIUM 333 MG PO TBEC: 666.0000 mg | DELAYED_RELEASE_TABLET | Freq: Three times a day (TID) | ORAL | Status: DC

## 2013-10-03 MED ORDER — TRAZODONE HCL 50 MG PO TABS: ORAL_TABLET | ORAL | Status: DC

## 2013-10-03 MED ORDER — HYDROXYZINE HCL 50 MG PO TABS
50.0000 mg | ORAL_TABLET | Freq: Once | ORAL | Status: AC
  Administered 2013-10-03: 50 mg via ORAL
  Filled 2013-10-03: qty 1

## 2013-10-03 MED ORDER — SERTRALINE HCL 100 MG PO TABS: ORAL_TABLET | ORAL | Status: DC

## 2013-10-03 MED ORDER — ESOMEPRAZOLE MAGNESIUM 40 MG PO CPDR: 40.0000 mg | DELAYED_RELEASE_CAPSULE | Freq: Every day | ORAL | Status: DC

## 2013-10-03 NOTE — BHH Suicide Risk Assessment (Signed)
Suicide Risk Assessment  Discharge Assessment     Demographic Factors:  Adolescent or young adult, Caucasian, Low socioeconomic status and Unemployed  Mental Status Per Nursing Assessment::   On Admission:     Current Mental Status by Physician: Patient is anxious and cooperative. She has normal psychomotor activity. Her stated mood is anxious and affect is constricted. She has normal speech and thought process. She has denied suicidal or homicidal ideation. She has fine insight, judgment and impulse control.   Loss Factors: Decline in physical health  Historical Factors: Family history of mental illness or substance abuse and Impulsivity  Risk Reduction Factors:   Responsible for children under 50 years of age, Sense of responsibility to family, Religious beliefs about death, Living with another person, especially a relative, Positive social support, Positive therapeutic relationship and Positive coping skills or problem solving skills  Continued Clinical Symptoms:  Alcohol/Substance Abuse/Dependencies  Cognitive Features That Contribute To Risk:  Polarized thinking    Suicide Risk:  Minimal: No identifiable suicidal ideation.  Patients presenting with no risk factors but with morbid ruminations; may be classified as minimal risk based on the severity of the depressive symptoms  Discharge Diagnoses:   AXIS I:  Substance Induced Mood Disorder and alcohol dependence AXIS II:  Deferred AXIS III:   Past Medical History  Diagnosis Date  . Alcoholism   . Anxiety   . Depression   . Pneumonia   . GERD (gastroesophageal reflux disease)   . Cirrhosis   . Portal hypertension   . S/P alcohol detoxification     2-3 days at behavioral health previously   AXIS IV:  occupational problems, other psychosocial or environmental problems, problems related to social environment and problems with primary support group AXIS V:  61-70 mild symptoms  Plan Of Care/Follow-up recommendations:   Activity:  As tolerated Diet:  Regular  Is patient on multiple antipsychotic therapies at discharge:  No   Has Patient had three or more failed trials of antipsychotic monotherapy by history:  No  Recommended Plan for Multiple Antipsychotic Therapies: NA  Matej Sappenfield,JANARDHAHA R., MD 10/03/2013, 9:59 AM

## 2013-10-03 NOTE — Progress Notes (Signed)
Patient ID: Sara [name], female   DOB: 17-Dec-1975, 37 y.o.   MRN: [id number] Discharge Note:  Patient discharged with family members who will take her to [facility].  Denied SI and HI.   Denied A/V hallucinations.  Denied pain.  Suicide prevention information given to patient and discussed with patient who stated she understood and had no questions.  Patient stated she received all her belongings, clothing, miscellaneous items, toiletries, prescriptions, pocketbook, 2 cellphones/charger, nook, pants with string.  Patient stated she appreciated all assistance received from [facility] staff.

## 2013-10-03 NOTE — Progress Notes (Signed)
D: Pt mood is depressed. She rates her depression a 4/10. She has been attending groups and interacts well with her peers. Pt is pleasant and cooperative. A: Support given. Verbalization encouraged. Medications given as prescribed. Pt encouraged to come to nurse with any concerns.  R: Pt is receptive. No complaints of pain or discomfort at this time. Q15 min safety checks maintained. Will continue to monitor pt.

## 2013-10-03 NOTE — Progress Notes (Signed)
[facility] Adult Case Management Discharge Plan :  Will you be returning to the same living situation after discharge: Yes,  can return home after treatment At discharge, do you have transportation home?:Yes,  family will pick pt up and transport to [facility] today Do you have the ability to pay for your medications:Yes,  access to meds  Release of information consent forms completed and in the chart;  Patient's signature needed at discharge.  Patient to Follow up at: Follow-up Information   Follow up with [facility] On 10/03/2013. (Arrive at 1:00 pm on this date for further inpatient treatment)    Contact information:   [address] [place], [place] [address] Phone: [phone] Fax: [phone]      Patient denies SI/HI:   Yes,  denies SI/HI    Safety Planning and Suicide Prevention discussed:  Yes,  discussed with pt, n/a to contact family or friends due to no SI on admission.  See suicide prevention education note.    Carmina Miller 10/03/2013, 9:07 AM

## 2013-10-03 NOTE — Discharge Summary (Signed)
Physician Discharge Summary Note  Patient:  Sara [name] is an 37 y.o., female MRN:  [id number] DOB:  [birth year] Patient phone:  [phone] (home)  Patient address:   [address] [place] [address],   Date of Admission:  09/27/2013 Date of Discharge: 10/03/2013   Reason for Admission:  Alcohol dependence with MDD  Discharge Diagnoses: Principal Problem:   Alcohol withdrawal Active Problems:   Substance induced mood disorder   Alcohol abuse with intoxication   Alcohol dependence  ROS  DSM5: Schizophrenia Disorders:  Obsessive-Compulsive Disorders:  Trauma-Stressor Disorders:  Substance/Addictive Disorders: Alcohol Intoxication with Use Disorder - Severe (F10.229) and Alcohol Withdrawal (291.81)  Depressive Disorders: Major Depressive Disorder - Unspecified (296.20)  AXIS I: Substance Induced Mood Disorder and Alcohol intoxication, withdrawal and dependence  AXIS II: Deferred  AXIS III:  Past Medical History   Diagnosis  Date   .  Alcoholism    .  Anxiety    .  Depression    .  Pneumonia    .  GERD (gastroesophageal reflux disease)    .  Cirrhosis    .  Portal hypertension    .  S/P alcohol detoxification      2-3 days at behavioral health previously    AXIS IV: occupational problems, other psychosocial or environmental problems, problems related to social environment and problems with primary support group  AXIS V: 41-50 serious symptoms   Level of Care:  RTC  Hospital Course:  Sara [name] is a 37 year old WF admitted voluntarily, emergently from [facility] for substance induced mood disorder and alcohol intoxication, withdrawal and dependence. Reportedly she has failed to stop drinking alcohol due to withdrawal symptoms. She has been nauseated, shaky, tremors, vomiting, increased anxiety and heart racing and night sweats.       She was treated with the standard Librium protocol for detox.  Medical problems were identified and treated.  Home medications was  restarted as appropriate.  Improvement was monitored by CIWA scores and patient's daily report of withdrawal symptom reduction.  Emotional and mental status was monitored by daily self inventory reports completed by the patient and clinical staff.        Sara [name] was evaluated by the treatment team for stability and plans for continued recovery upon discharge.  She was offered further treatment options upon discharge including Residential, Intensive Outpatient and Outpatient treatment.        The patient's motivation was an integral factor for scheduling further treatment.  She chose to go to [facility] and a bed was available on the day of discharge.  Sara [name] responded well to medication management and a supportive therapeutic milieu. She was motivated to continue her recovery and eager to pursue the next phase of care.        Upon completion of detox the patient was both and mentally and medically stable for discharge.     Consults:  psychiatry  Significant Diagnostic Studies:    Discharge Vitals:   Blood pressure 100/67, pulse 97, temperature 98.5 F (36.9 C), temperature source Oral, resp. rate 16, height 5\' 1"  (1.549 m), weight 81.647 kg (180 lb), last menstrual period 05/27/2013. Body mass index is 34.03 kg/(m^2). Lab Results:   No results found for this or any previous visit (from the past 72 hour(s)).  Physical Findings: AIMS: Facial and Oral Movements Muscles of Facial Expression: None, normal Lips and Perioral Area: None, normal Jaw: None, normal Tongue: None, normal,Extremity Movements Upper (arms, wrists, hands, fingers): None, normal  Lower (legs, knees, ankles, toes): None, normal, Trunk Movements Neck, shoulders, hips: None, normal, Overall Severity Severity of abnormal movements (highest score from questions above): None, normal Incapacitation due to abnormal movements: None, normal Patient's awareness of abnormal movements (rate only patient's report): No Awareness, Dental  Status Current problems with teeth and/or dentures?: No Does patient usually wear dentures?: No  CIWA:  CIWA-Ar Total: 0 COWS:     Psychiatric Specialty Exam: See Psychiatric Specialty Exam and Suicide Risk Assessment completed by Attending Physician prior to discharge.  Discharge destination:  RTC  Is patient on multiple antipsychotic therapies at discharge:  No   Has Patient had three or more failed trials of antipsychotic monotherapy by history:  No  Recommended Plan for Multiple Antipsychotic Therapies: NA  Discharge Orders   Future Appointments Provider Department Dept Phone   10/28/2013 10:45 AM Antionette Char, MD [facility] [phone]   Future Orders Complete By Expires   Diet - low sodium heart healthy  As directed    Discharge instructions  As directed    Comments:     Take all of your medications as directed. Be sure to keep all of your follow up appointments.  If you are unable to keep your follow up appointment, call your Doctor's office to let them know, and reschedule.  Make sure that you have enough medication to last until your appointment. Be sure to get plenty of rest. Going to bed at the same time each night will help. Try to avoid sleeping during the day.  Increase your activity as tolerated. Regular exercise will help you to sleep better and improve your mental health. Eating a heart healthy diet is recommended. Try to avoid salty or fried foods. Be sure to avoid all alcohol and illegal drugs.   Increase activity slowly  As directed        Medication List    STOP taking these medications       multivitamin with minerals Tabs tablet      TAKE these medications     Indication   acamprosate 333 MG tablet  Commonly known as:  CAMPRAL  Take 2 tablets (666 mg total) by mouth 3 (three) times daily with meals. For alcohol cravings.   Indication:  Excessive Use of Alcohol     cetirizine 10 MG tablet  Commonly known as:  ZYRTEC  Take 10 mg by  mouth daily as needed for allergies.      esomeprazole 40 MG capsule  Commonly known as:  NEXIUM  Take 1 capsule (40 mg total) by mouth daily. For esophagitis   Indication:  Gastroesophageal Reflux Disease with Current Symptoms     sertraline 100 MG tablet  Commonly known as:  ZOLOFT  Take one tablet (100mg ) a day for anxiety and depression.   Indication:  Major Depressive Disorder     traZODone 50 MG tablet  Commonly known as:  DESYREL  Take one tablet by mouth (50mg ) at bedtime for insomnia.   Indication:  Trouble Sleeping           Follow-up Information   Follow up with [facility] On 10/03/2013. (Arrive at 1:00 pm on this date for further inpatient treatment)    Contact information:   [address] [place], [place] [address] Phone: [phone] Fax: [phone]      Follow-up recommendations:   Activities: Resume activity as tolerated. Diet: Heart healthy low sodium diet Tests: Follow up testing will be determined by your out patient provider. Comments:  Total Discharge Time:  Greater than 30 minutes.  Signed: Rona Ravens. Mashburn RPAC 10:29 AM 10/03/2013  This patient was seen personally for psychiatric evaluation, suicide risk assessment, made treatment plan and case discussed with the treatment team and physician extender. Patient was discharged with the discharge plan.Reviewed the information documented and agree with the treatment plan.   Sara [name],JANARDHAHA R. 10/03/2013 5:49 PM

## 2013-10-03 NOTE — Progress Notes (Signed)
D:  Patient's self inventory sheet, patient has poor sleep, good appetite, normal energy level, poor attention span.  Rated depression and hopeless #1.  Denied withdrawals.  Denied SI.  Denied physical problems.  Worst pain #1.   "[facility] after [facility].  Very nervous and anxious."  Does have discharge plans.  No problems taking meds after discharge. A:  Medications administered per MD orders.  Emotional support and encouragement given patient. R:  Denied SI and HI.  Denied A/V hallucinations.  Denied pain.  Will continue 15 minute checks for safety.  Safety maintained.

## 2013-10-03 NOTE — Progress Notes (Signed)
The focus of this group is to educate the patient on the purpose and policies of crisis stabilization and provide a format to answer questions about their admission.  The group details unit policies and expectations of patients while admitted.  Patient attended 0900 nurse education orientation group this morning.  Patient listened, appropriate affect, alert, appropriate insight and engagement.  Today patient will work on goals for discharge.

## 2013-10-04 ENCOUNTER — Other Ambulatory Visit (HOSPITAL_COMMUNITY): Payer: BC Managed Care – PPO

## 2013-10-06 NOTE — ED Provider Notes (Signed)
Medical screening examination/treatment/procedure(s) were performed by non-physician practitioner and as supervising physician I was immediately available for consultation/collaboration.  EKG Interpretation   None        Henry Demeritt T Yoon Barca, MD 10/06/13 2123 

## 2013-10-07 ENCOUNTER — Other Ambulatory Visit (HOSPITAL_COMMUNITY): Payer: BC Managed Care – PPO

## 2013-10-08 ENCOUNTER — Other Ambulatory Visit: Payer: Self-pay | Admitting: *Deleted

## 2013-10-08 ENCOUNTER — Ambulatory Visit
Admission: RE | Admit: 2013-10-08 | Discharge: 2013-10-08 | Disposition: A | Discharge status: Home / Self Care | Payer: No Typology Code available for payment source | Source: Ambulatory Visit | Attending: *Deleted | Admitting: *Deleted

## 2013-10-08 DIAGNOSIS — R7611 Nonspecific reaction to tuberculin skin test without active tuberculosis: Secondary | ICD-10-CM

## 2013-10-08 NOTE — Progress Notes (Signed)
Patient Discharge Instructions:  After Visit Summary (AVS):   Faxed to:  10/08/13 Discharge Summary Note:   Faxed to:  10/08/13 Psychiatric Admission Assessment Note:   Faxed to:  10/08/13 Suicide Risk Assessment - Discharge Assessment:   Faxed to:  10/08/13 Faxed/Sent to the Next Level Care provider:  10/08/13 Faxed to [facility] @ [phone]  Jerelene Redden, 10/08/2013, 4:11 PM

## 2013-10-09 ENCOUNTER — Other Ambulatory Visit (HOSPITAL_COMMUNITY): Payer: BC Managed Care – PPO

## 2013-10-11 ENCOUNTER — Other Ambulatory Visit (HOSPITAL_COMMUNITY): Payer: BC Managed Care – PPO

## 2013-10-14 ENCOUNTER — Other Ambulatory Visit (HOSPITAL_COMMUNITY): Payer: BC Managed Care – PPO

## 2013-10-16 ENCOUNTER — Other Ambulatory Visit (HOSPITAL_COMMUNITY): Payer: BC Managed Care – PPO

## 2013-10-18 ENCOUNTER — Other Ambulatory Visit (HOSPITAL_COMMUNITY): Payer: BC Managed Care – PPO

## 2013-10-21 ENCOUNTER — Other Ambulatory Visit (HOSPITAL_COMMUNITY): Payer: BC Managed Care – PPO | Attending: Psychiatry

## 2013-10-23 ENCOUNTER — Other Ambulatory Visit (HOSPITAL_COMMUNITY): Payer: BC Managed Care – PPO

## 2013-10-25 ENCOUNTER — Other Ambulatory Visit (HOSPITAL_COMMUNITY): Payer: BC Managed Care – PPO

## 2013-10-28 ENCOUNTER — Other Ambulatory Visit (HOSPITAL_COMMUNITY): Payer: BC Managed Care – PPO

## 2013-10-28 ENCOUNTER — Ambulatory Visit: Payer: BC Managed Care – PPO | Admitting: Obstetrics & Gynecology

## 2013-10-30 ENCOUNTER — Other Ambulatory Visit (HOSPITAL_COMMUNITY): Payer: BC Managed Care – PPO

## 2013-11-01 ENCOUNTER — Other Ambulatory Visit (HOSPITAL_COMMUNITY): Payer: BC Managed Care – PPO

## 2013-11-04 ENCOUNTER — Other Ambulatory Visit (HOSPITAL_COMMUNITY): Payer: BC Managed Care – PPO

## 2013-11-06 ENCOUNTER — Other Ambulatory Visit (HOSPITAL_COMMUNITY): Payer: BC Managed Care – PPO

## 2013-11-08 ENCOUNTER — Other Ambulatory Visit (HOSPITAL_COMMUNITY): Payer: BC Managed Care – PPO | Attending: Psychiatry

## 2013-11-08 ENCOUNTER — Other Ambulatory Visit (HOSPITAL_COMMUNITY): Payer: BC Managed Care – PPO

## 2013-11-11 ENCOUNTER — Other Ambulatory Visit (HOSPITAL_COMMUNITY): Payer: BC Managed Care – PPO

## 2013-11-11 ENCOUNTER — Encounter (HOSPITAL_COMMUNITY): Payer: Self-pay | Admitting: Psychology

## 2013-11-13 ENCOUNTER — Other Ambulatory Visit (HOSPITAL_COMMUNITY): Payer: BC Managed Care – PPO

## 2013-11-15 ENCOUNTER — Other Ambulatory Visit (HOSPITAL_COMMUNITY): Payer: BC Managed Care – PPO

## 2013-11-18 ENCOUNTER — Other Ambulatory Visit (HOSPITAL_COMMUNITY): Payer: BC Managed Care – PPO

## 2013-11-20 ENCOUNTER — Other Ambulatory Visit (HOSPITAL_COMMUNITY): Payer: BC Managed Care – PPO

## 2013-11-22 ENCOUNTER — Other Ambulatory Visit (HOSPITAL_COMMUNITY): Payer: BC Managed Care – PPO | Attending: Psychiatry

## 2013-11-25 ENCOUNTER — Other Ambulatory Visit (HOSPITAL_COMMUNITY): Payer: BC Managed Care – PPO

## 2013-11-27 ENCOUNTER — Other Ambulatory Visit (HOSPITAL_COMMUNITY): Payer: BC Managed Care – PPO

## 2013-11-27 NOTE — Progress Notes (Unsigned)
Patient ID: Sara [name], female   DOB: May 25, 1976, 38 y.o.   MRN: [id number] CD-IOP. The patient was scheduled to appear on Friday, the 19th, to begin the CD-IOP program. She had been discharged from [facility] earlier in the week and was recommended to enter a step-down program. She did not appear for group on Friday, nor did she return phone messages. She did not phone or appear for group today. This patient never followed up with the referral to this program. There is no official discharge since she never began the program.

## 2013-11-29 ENCOUNTER — Other Ambulatory Visit (HOSPITAL_COMMUNITY): Payer: BC Managed Care – PPO

## 2013-12-02 ENCOUNTER — Other Ambulatory Visit (HOSPITAL_COMMUNITY): Payer: BC Managed Care – PPO

## 2013-12-04 ENCOUNTER — Other Ambulatory Visit (HOSPITAL_COMMUNITY): Payer: BC Managed Care – PPO

## 2013-12-06 ENCOUNTER — Other Ambulatory Visit (HOSPITAL_COMMUNITY): Payer: BC Managed Care – PPO

## 2013-12-09 ENCOUNTER — Other Ambulatory Visit (HOSPITAL_COMMUNITY): Payer: BC Managed Care – PPO

## 2013-12-11 ENCOUNTER — Other Ambulatory Visit (HOSPITAL_COMMUNITY): Payer: BC Managed Care – PPO

## 2013-12-13 ENCOUNTER — Other Ambulatory Visit (HOSPITAL_COMMUNITY): Payer: BC Managed Care – PPO

## 2013-12-16 ENCOUNTER — Other Ambulatory Visit (HOSPITAL_COMMUNITY): Payer: BC Managed Care – PPO

## 2013-12-18 ENCOUNTER — Other Ambulatory Visit (HOSPITAL_COMMUNITY): Payer: BC Managed Care – PPO

## 2013-12-20 ENCOUNTER — Other Ambulatory Visit (HOSPITAL_COMMUNITY): Payer: BC Managed Care – PPO

## 2013-12-23 ENCOUNTER — Other Ambulatory Visit (HOSPITAL_COMMUNITY): Payer: BC Managed Care – PPO

## 2013-12-25 ENCOUNTER — Other Ambulatory Visit (HOSPITAL_COMMUNITY): Payer: BC Managed Care – PPO

## 2013-12-27 ENCOUNTER — Other Ambulatory Visit (HOSPITAL_COMMUNITY): Payer: BC Managed Care – PPO

## 2013-12-30 ENCOUNTER — Other Ambulatory Visit (HOSPITAL_COMMUNITY): Payer: BC Managed Care – PPO

## 2014-01-01 ENCOUNTER — Other Ambulatory Visit (HOSPITAL_COMMUNITY): Payer: BC Managed Care – PPO

## 2014-01-03 ENCOUNTER — Other Ambulatory Visit (HOSPITAL_COMMUNITY): Payer: BC Managed Care – PPO

## 2014-01-06 ENCOUNTER — Other Ambulatory Visit (HOSPITAL_COMMUNITY): Payer: BC Managed Care – PPO

## 2014-01-08 ENCOUNTER — Other Ambulatory Visit (HOSPITAL_COMMUNITY): Payer: BC Managed Care – PPO

## 2014-01-10 ENCOUNTER — Other Ambulatory Visit (HOSPITAL_COMMUNITY): Payer: BC Managed Care – PPO

## 2014-01-13 ENCOUNTER — Other Ambulatory Visit (HOSPITAL_COMMUNITY): Payer: BC Managed Care – PPO

## 2014-01-14 ENCOUNTER — Encounter (HOSPITAL_COMMUNITY): Payer: Self-pay | Admitting: Emergency Medicine

## 2014-01-14 ENCOUNTER — Inpatient Hospital Stay (HOSPITAL_COMMUNITY)
Admission: EM | Admit: 2014-01-14 | Discharge: 2014-01-16 | DRG: 438 | Disposition: A | Discharge status: Home / Self Care | Payer: BC Managed Care – PPO | Attending: Internal Medicine | Admitting: Internal Medicine

## 2014-01-14 DIAGNOSIS — F10129 Alcohol abuse with intoxication, unspecified: Secondary | ICD-10-CM | POA: Diagnosis present

## 2014-01-14 DIAGNOSIS — K766 Portal hypertension: Secondary | ICD-10-CM

## 2014-01-14 DIAGNOSIS — F411 Generalized anxiety disorder: Secondary | ICD-10-CM

## 2014-01-14 DIAGNOSIS — R7402 Elevation of levels of lactic acid dehydrogenase (LDH): Secondary | ICD-10-CM | POA: Diagnosis present

## 2014-01-14 DIAGNOSIS — D649 Anemia, unspecified: Secondary | ICD-10-CM

## 2014-01-14 DIAGNOSIS — K219 Gastro-esophageal reflux disease without esophagitis: Secondary | ICD-10-CM

## 2014-01-14 DIAGNOSIS — R740 Nonspecific elevation of levels of transaminase and lactic acid dehydrogenase [LDH]: Secondary | ICD-10-CM

## 2014-01-14 DIAGNOSIS — R748 Abnormal levels of other serum enzymes: Secondary | ICD-10-CM | POA: Diagnosis present

## 2014-01-14 DIAGNOSIS — F1020 Alcohol dependence, uncomplicated: Secondary | ICD-10-CM

## 2014-01-14 DIAGNOSIS — Z8249 Family history of ischemic heart disease and other diseases of the circulatory system: Secondary | ICD-10-CM

## 2014-01-14 DIAGNOSIS — F329 Major depressive disorder, single episode, unspecified: Secondary | ICD-10-CM | POA: Diagnosis present

## 2014-01-14 DIAGNOSIS — N926 Irregular menstruation, unspecified: Secondary | ICD-10-CM

## 2014-01-14 DIAGNOSIS — F10939 Alcohol use, unspecified with withdrawal, unspecified: Secondary | ICD-10-CM

## 2014-01-14 DIAGNOSIS — K852 Alcohol induced acute pancreatitis without necrosis or infection: Secondary | ICD-10-CM

## 2014-01-14 DIAGNOSIS — Z87891 Personal history of nicotine dependence: Secondary | ICD-10-CM

## 2014-01-14 DIAGNOSIS — D696 Thrombocytopenia, unspecified: Secondary | ICD-10-CM

## 2014-01-14 DIAGNOSIS — R1013 Epigastric pain: Secondary | ICD-10-CM | POA: Diagnosis present

## 2014-01-14 DIAGNOSIS — K7030 Alcoholic cirrhosis of liver without ascites: Secondary | ICD-10-CM

## 2014-01-14 DIAGNOSIS — F10229 Alcohol dependence with intoxication, unspecified: Secondary | ICD-10-CM | POA: Diagnosis present

## 2014-01-14 DIAGNOSIS — N939 Abnormal uterine and vaginal bleeding, unspecified: Secondary | ICD-10-CM

## 2014-01-14 DIAGNOSIS — F1010 Alcohol abuse, uncomplicated: Secondary | ICD-10-CM

## 2014-01-14 DIAGNOSIS — K859 Acute pancreatitis without necrosis or infection, unspecified: Principal | ICD-10-CM

## 2014-01-14 DIAGNOSIS — R7401 Elevation of levels of liver transaminase levels: Secondary | ICD-10-CM | POA: Diagnosis present

## 2014-01-14 DIAGNOSIS — F10929 Alcohol use, unspecified with intoxication, unspecified: Secondary | ICD-10-CM

## 2014-01-14 DIAGNOSIS — D61818 Other pancytopenia: Secondary | ICD-10-CM

## 2014-01-14 DIAGNOSIS — F410 Panic disorder [episodic paroxysmal anxiety] without agoraphobia: Secondary | ICD-10-CM

## 2014-01-14 DIAGNOSIS — F3289 Other specified depressive episodes: Secondary | ICD-10-CM | POA: Diagnosis present

## 2014-01-14 DIAGNOSIS — G9340 Encephalopathy, unspecified: Secondary | ICD-10-CM | POA: Diagnosis present

## 2014-01-14 DIAGNOSIS — R109 Unspecified abdominal pain: Secondary | ICD-10-CM

## 2014-01-14 DIAGNOSIS — F10239 Alcohol dependence with withdrawal, unspecified: Secondary | ICD-10-CM

## 2014-01-14 DIAGNOSIS — F32A Depression, unspecified: Secondary | ICD-10-CM | POA: Diagnosis present

## 2014-01-14 DIAGNOSIS — K2920 Alcoholic gastritis without bleeding: Secondary | ICD-10-CM | POA: Diagnosis present

## 2014-01-14 DIAGNOSIS — K7010 Alcoholic hepatitis without ascites: Secondary | ICD-10-CM

## 2014-01-14 DIAGNOSIS — F419 Anxiety disorder, unspecified: Secondary | ICD-10-CM | POA: Diagnosis present

## 2014-01-14 DIAGNOSIS — F1994 Other psychoactive substance use, unspecified with psychoactive substance-induced mood disorder: Secondary | ICD-10-CM

## 2014-01-14 LAB — BASIC METABOLIC PANEL
BUN: 5 mg/dL — AB (ref 6–23)
CO2: 26 mEq/L (ref 19–32)
CREATININE: 0.43 mg/dL — AB (ref 0.50–1.10)
Calcium: 9.3 mg/dL (ref 8.4–10.5)
Chloride: 100 mEq/L (ref 96–112)
Glucose, Bld: 108 mg/dL — ABNORMAL HIGH (ref 70–99)
Potassium: 3.9 mEq/L (ref 3.7–5.3)
Sodium: 141 mEq/L (ref 137–147)

## 2014-01-14 LAB — HEPATIC FUNCTION PANEL
ALT: 51 U/L — ABNORMAL HIGH (ref 0–35)
AST: 207 U/L — ABNORMAL HIGH (ref 0–37)
Albumin: 3.4 g/dL — ABNORMAL LOW (ref 3.5–5.2)
Alkaline Phosphatase: 118 U/L — ABNORMAL HIGH (ref 39–117)
BILIRUBIN DIRECT: 0.7 mg/dL — AB (ref 0.0–0.3)
BILIRUBIN TOTAL: 1.6 mg/dL — AB (ref 0.3–1.2)
Indirect Bilirubin: 0.9 mg/dL (ref 0.3–0.9)
Total Protein: 7.8 g/dL (ref 6.0–8.3)

## 2014-01-14 LAB — I-STAT TROPONIN, ED: Troponin i, poc: 0 ng/mL (ref 0.00–0.08)

## 2014-01-14 LAB — CBC
HEMATOCRIT: 32.7 % — AB (ref 36.0–46.0)
Hemoglobin: 10.5 g/dL — ABNORMAL LOW (ref 12.0–15.0)
MCH: 29.7 pg (ref 26.0–34.0)
MCHC: 32.1 g/dL (ref 30.0–36.0)
MCV: 92.4 fL (ref 78.0–100.0)
PLATELETS: 63 10*3/uL — AB (ref 150–400)
RBC: 3.54 MIL/uL — ABNORMAL LOW (ref 3.87–5.11)
RDW: 17.6 % — ABNORMAL HIGH (ref 11.5–15.5)
WBC: 3.4 10*3/uL — ABNORMAL LOW (ref 4.0–10.5)

## 2014-01-14 LAB — RAPID URINE DRUG SCREEN, HOSP PERFORMED
Amphetamines: NOT DETECTED
Barbiturates: POSITIVE — AB
Benzodiazepines: NOT DETECTED
Cocaine: NOT DETECTED
OPIATES: NOT DETECTED
Tetrahydrocannabinol: NOT DETECTED

## 2014-01-14 LAB — ETHANOL: ALCOHOL ETHYL (B): 200 mg/dL — AB (ref 0–11)

## 2014-01-14 LAB — LIPASE, BLOOD: LIPASE: 136 U/L — AB (ref 11–59)

## 2014-01-14 MED ORDER — SODIUM CHLORIDE 0.9 % IV SOLN
1000.0000 mL | Freq: Once | INTRAVENOUS | Status: AC
  Administered 2014-01-14: 1000 mL via INTRAVENOUS

## 2014-01-14 MED ORDER — SODIUM CHLORIDE 0.9 % IV SOLN
1000.0000 mL | INTRAVENOUS | Status: DC
  Administered 2014-01-14: 1000 mL via INTRAVENOUS

## 2014-01-14 MED ORDER — FENTANYL CITRATE 0.05 MG/ML IJ SOLN
50.0000 ug | Freq: Once | INTRAMUSCULAR | Status: AC
  Administered 2014-01-14: 50 ug via INTRAVENOUS
  Filled 2014-01-14: qty 2

## 2014-01-14 MED ORDER — ONDANSETRON HCL 4 MG/2ML IJ SOLN
4.0000 mg | Freq: Once | INTRAMUSCULAR | Status: AC
  Administered 2014-01-14: 4 mg via INTRAVENOUS
  Filled 2014-01-14: qty 2

## 2014-01-14 NOTE — ED Notes (Signed)
Pt is unable to void at this time.  

## 2014-01-14 NOTE — H&P (Signed)
[facility] History and Physical  Sara [name] QJJ:[id number] DOB: 17-Apr-1976 DOA: 01/14/2014  Referring physician: ED physician PCP: Leandrew Koyanagi, MD   Chief Complaint: altered mental status   HPI:  Pt is 38 yo female with history of alcohol abuse who [facility], started to drink about 2 bottles of wine daily for two weeks now. Currently with epigastric abdominal pain, radiating to the back, 10/10 in severity, constant and sharp, no specific alleviating factors, associated with nausea and non blood vomiting, poor oral intake. She denies chest pain, no fevers, chills, no shortness of breath.  In ED, pt intoxicated, alcohol level > 200, elevated lipase. TR[facility] asked to admit to SDU for further evaluation.   Assessment and Plan: Active Problems: Acute encephalopathy - secondary to alcohol intoxication - will also ask for UDS - admit to SDU - place on IVF, supportive care with antiemetics as needed - keep NPO Acute alcohol intoxication - IVF as noted above and repeat alcohol level in AM - monitor for withdrawal symptoms - place on CIWA  - provide consultation on cessation once medically more stable Acute pancreatitis - secondary to alcohol use - will keep NPO, IVF, analgesia and antiemetics as needed - repeat lipase in AM Transaminitis - alcoholic hepatitis - repeat CMET In AM Pancytopenia - possibly alcohol induced bone marrow damage - no signs of active bleeding - CBC in AM   Code Status: Full Family Communication: Pt at bedside Disposition Plan: Admit to SDU    Review of Systems:  Unable to obtain due to AMS     Past Medical History  Diagnosis Date  . Alcoholism   . Anxiety   . Depression   . Pneumonia   . GERD (gastroesophageal reflux disease)   . Cirrhosis   . Portal hypertension   . S/P alcohol detoxification     2-3 days at behavioral health previously    Past Surgical History  Procedure Laterality Date   . Cholecystectomy      Social History:  reports that she has quit smoking. She has never used smokeless tobacco. She reports that she drinks about 42.0 ounces of alcohol per week. She reports that she does not use illicit drugs.  Allergies  Allergen Reactions  . Morphine And Related     Slowed HR, lowered BP    Family History  Problem Relation Age of Onset  . Colon polyps Mother   . Hypertension Mother   . Thyroid disease Mother   . Alcoholism Mother   . Alcoholism Father   . Alcohol abuse Maternal Grandfather   . Alcohol abuse Paternal Grandfather   . Alcohol abuse Paternal Aunt   . Alcohol abuse Maternal Uncle     Prior to Admission medications   Medication Sig Start Date End Date Taking? Authorizing Provider  Alum & Mag Hydroxide-Simeth (GI COCKTAIL) SUSP suspension Take 30 mLs by mouth 2 (two) times daily as needed for indigestion. Shake well.   Yes Historical Provider, MD  ibuprofen (ADVIL,MOTRIN) 200 MG tablet Take 400 mg by mouth every 6 (six) hours as needed for moderate pain.   Yes Historical Provider, MD  ranitidine (ZANTAC) 150 MG tablet Take 300 mg by mouth 2 (two) times daily.   Yes Historical Provider, MD    Physical Exam: Filed Vitals:   01/14/14 1831 01/14/14 2238 01/14/14 2334  BP: 131/71 126/74 117/69  Pulse: 78 85 80  Temp: 98.7 F (37.1 C) 98.5 F (36.9 C)  TempSrc: Oral    Resp: 20 18   SpO2: 96% 96% 95%    Physical Exam  Constitutional: Appears confused and intoxicated No distress.  HENT: Normocephalic. External right and left ear normal. Dry MM Eyes: Conjunctivae and EOM are normal. PERRLA, no scleral icterus.  Neck: Normal ROM. Neck supple. No JVD. No tracheal deviation. No thyromegaly.  CVS: RRR, S1/S2 +, no murmurs, no gallops, no carotid bruit.  Pulmonary: Effort and breath sounds normal, no stridor, rhonchi, wheezes, rales.  Abdominal: Soft. BS +,  no distension, tenderness in upper abd quadrants, no rebound or guarding.   Musculoskeletal: Normal range of motion. No edema and no tenderness.  Lymphadenopathy: No lymphadenopathy noted, cervical, inguinal. Neuro: Alert. Normal reflexes, muscle tone coordination. No cranial nerve deficit. Skin: Skin is warm and dry. No rash noted. Not diaphoretic. No erythema. No pallor.  Psychiatric: Unable to assess due to AMS   Labs on Admission:  Basic Metabolic Panel:  Recent Labs Lab 01/14/14 1921  NA 141  K 3.9  CL 100  CO2 26  GLUCOSE 108*  BUN 5*  CREATININE 0.43*  CALCIUM 9.3   Liver Function Tests:  Recent Labs Lab 01/14/14 2233  AST 207*  ALT 51*  ALKPHOS 118*  BILITOT 1.6*  PROT 7.8  ALBUMIN 3.4*    Recent Labs Lab 01/14/14 1921  LIPASE 136*   CBC:  Recent Labs Lab 01/14/14 1921  WBC 3.4*  HGB 10.5*  HCT 32.7*  MCV 92.4  PLT 63*   Radiological Exams on Admission: No results found.  EKG: Normal sinus rhythm, no ST/T wave changes  Faye Ramsay, MD  [facility] Pager [phone]  If 7PM-7AM, please contact night-coverage [email or web address] Password [facility] 01/14/2014, 11:53 PM

## 2014-01-14 NOTE — ED Notes (Signed)
Pt c/o central chest pain that started yesterday that radiates to back, along with nausea. Pt also states she has blurred vision.

## 2014-01-14 NOTE — ED Provider Notes (Signed)
CSN: [id number]     Arrival date & time 01/14/14  1827 History   First MD Initiated Contact with Patient 01/14/14 2059     Chief Complaint  Patient presents with  . Chest Pain     (Consider location/radiation/quality/duration/timing/severity/associated sxs/prior Treatment) HPI Patient reports about 2 days ago she started getting epigastric abdominal pain that radiates into her back. She states the pain is constant. She has had nausea without vomiting, fever or diarrhea. She describes the pain as grabbing. She states nothing she does makes it feel worse, nothing she does makes it feel better. She states she was seen at an urgent care this morning and they gave her a GI cocktail without improvement of her pain. She was advised to come to the ER if she did not improve. She reports she had this pain before last March when she had pancreatitis. Patient states she spent a month and [facility] and got out the middle of December. She started restarting drinking about one to 2 weeks ago. She is currently drinking 2 bottles of wine a day. She states she did also drink today "to help the pain"  PCP Dr Laney Pastor  Past Medical History  Diagnosis Date  . Alcoholism   . Anxiety   . Depression   . Pneumonia   . GERD (gastroesophageal reflux disease)   . Cirrhosis   . Portal hypertension   . S/P alcohol detoxification     2-3 days at behavioral health previously   Past Surgical History  Procedure Laterality Date  . Cholecystectomy     Family History  Problem Relation Age of Onset  . Colon polyps Mother   . Hypertension Mother   . Thyroid disease Mother   . Alcoholism Mother   . Alcoholism Father   . Alcohol abuse Maternal Grandfather   . Alcohol abuse Paternal Grandfather   . Alcohol abuse Paternal Aunt   . Alcohol abuse Maternal Uncle    History  Substance Use Topics  . Smoking status: Former Research scientist (life sciences)  . Smokeless tobacco: Never Used  . Alcohol Use: 42.0 oz/week    70 Glasses of  wine per week     Comment: reports 1 bottle of wine and pint of or more of vodka daily. 2 bottles of wine currently and pint of vodka some days for at least 4-5 days.a month.     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      Allergies  Morphine and related  Home Medications   Current Outpatient Rx  Name  Route  Sig  Dispense  Refill  . Alum & Mag Hydroxide-Simeth (GI COCKTAIL) SUSP suspension   Oral   Take 30 mLs by mouth 2 (two) times daily as needed for indigestion. Shake well.         . ibuprofen (ADVIL,MOTRIN) 200 MG tablet   Oral   Take 400 mg by mouth every 6 (six) hours as needed for moderate pain.         . ranitidine (ZANTAC) 150 MG tablet   Oral   Take 300 mg by mouth 2 (two) times daily.          BP 131/71  Pulse 78  Temp(Src) 98.7 F (37.1 C) (Oral)  Resp 20  SpO2 96%  LMP 09/23/2013  Vital signs normal   Physical Exam  Nursing note and vitals reviewed. Constitutional: She is oriented to person, place, and time. She appears well-developed and well-nourished.  Non-toxic appearance. She does not appear ill. No distress.  HENT:  Head: Normocephalic and atraumatic.  Right Ear: External ear normal.  Left Ear: External ear normal.  Nose: Nose normal. No mucosal edema or rhinorrhea.  Mouth/Throat: Oropharynx is clear and moist and mucous membranes are normal. No dental abscesses or uvula swelling.  Eyes: Conjunctivae and EOM are normal. Pupils are equal, round, and reactive to light.  Neck: Normal range of motion and full passive range of motion without pain. Neck supple.  Cardiovascular: Normal rate, regular rhythm and normal heart sounds.  Exam reveals no gallop and no friction rub.   No murmur heard. Pulmonary/Chest: Effort normal and breath sounds normal. No respiratory distress.  She has no wheezes. She has no rhonchi. She has no rales. She exhibits no tenderness and no crepitus.  Abdominal: Soft. Normal appearance and bowel sounds are normal. She exhibits no distension. There is tenderness. There is no rebound and no guarding.    Musculoskeletal: Normal range of motion. She exhibits no edema and no tenderness.  Moves all extremities well.   Neurological: She is alert and oriented to person, place, and time. She has normal strength. No cranial nerve deficit.  Skin: Skin is warm, dry and intact. No rash noted. No erythema. No pallor.  Psychiatric: She has a normal mood and affect. Her speech is normal and behavior is normal. Her mood appears not anxious.    ED Course  Procedures (including critical care time)  Medications  0.9 %  sodium chloride infusion (0 mLs Intravenous Stopped 01/14/14 2334)    Followed by  0.9 %  sodium chloride infusion (0 mLs Intravenous Stopped 01/14/14 2231)    Followed by  0.9 %  sodium chloride infusion (1,000 mLs Intravenous New Bag/Given 01/14/14 2334)  fentaNYL (SUBLIMAZE) injection 50 mcg (50 mcg Intravenous Given 01/14/14 2218)  ondansetron (ZOFRAN) injection 4 mg (4 mg Intravenous Given 01/14/14 2218)  fentaNYL (SUBLIMAZE) injection 50 mcg (50 mcg Intravenous Given 01/14/14 2332)   Discussed results with patient. She is agreeable to hospital medical admission  23:33 Dr Garwin Brothers, admit to step down, team 8  Labs Review Results for orders placed during the hospital encounter of 01/14/14  CBC      Result Value Ref Range   WBC 3.4 (*) 4.0 - 10.5 K/uL   RBC 3.54 (*) 3.87 - 5.11 MIL/uL   Hemoglobin 10.5 (*) 12.0 - 15.0 g/dL   HCT 32.7 (*) 36.0 - 46.0 %   MCV 92.4  78.0 - 100.0 fL   MCH 29.7  26.0 - 34.0 pg   MCHC 32.1  30.0 - 36.0 g/dL   RDW 17.6 (*) 11.5 - 15.5 %   Platelets 63 (*) 150 - 400 K/uL  BASIC METABOLIC PANEL      Result Value Ref Range   Sodium 141  137 - 147 mEq/L   Potassium 3.9  3.7 - 5.3 mEq/L   Chloride 100  96  - 112 mEq/L   CO2 26  19 - 32 mEq/L   Glucose, Bld 108 (*) 70 - 99 mg/dL   BUN 5 (*) 6 - 23 mg/dL   Creatinine, Ser 0.43 (*) 0.50 - 1.10 mg/dL   Calcium 9.3  8.4 - 10.5 mg/dL   GFR calc non  Af Amer >90  >90 mL/min   GFR calc Af Amer >90  >90 mL/min  LIPASE, BLOOD      Result Value Ref Range   Lipase 136 (*) 11 - 59 U/L  HEPATIC FUNCTION PANEL      Result Value Ref Range   Total Protein 7.8  6.0 - 8.3 g/dL   Albumin 3.4 (*) 3.5 - 5.2 g/dL   AST 207 (*) 0 - 37 U/L   ALT 51 (*) 0 - 35 U/L   Alkaline Phosphatase 118 (*) 39 - 117 U/L   Total Bilirubin 1.6 (*) 0.3 - 1.2 mg/dL   Bilirubin, Direct 0.7 (*) 0.0 - 0.3 mg/dL   Indirect Bilirubin 0.9  0.3 - 0.9 mg/dL  ETHANOL      Result Value Ref Range   Alcohol, Ethyl (B) 200 (*) 0 - 11 mg/dL  URINE RAPID DRUG SCREEN (HOSP PERFORMED)      Result Value Ref Range   Opiates NONE DETECTED  NONE DETECTED   Cocaine NONE DETECTED  NONE DETECTED   Benzodiazepines NONE DETECTED  NONE DETECTED   Amphetamines NONE DETECTED  NONE DETECTED   Tetrahydrocannabinol NONE DETECTED  NONE DETECTED   Barbiturates POSITIVE (*) NONE DETECTED  I-STAT TROPOININ, ED      Result Value Ref Range   Troponin i, poc 0.00  0.00 - 0.08 ng/mL   Comment 3            Laboratory interpretation all normal except elevated lipase consistent with pancreatitis, anemia, alcohol intoxication    Imaging Review No results found.  EKG Interpretation    Date/Time:  Tuesday January 14 2014 18:35:26 EST Ventricular Rate:  72 PR Interval:  173 QRS Duration: 88 QT Interval:  444 QTC Calculation: 486 R Axis:   48 Text Interpretation:  Sinus rhythm Borderline prolonged QT interval Baseline wander in lead(s) V5 V6 No significant change since last tracing 30 Jun 2013 Confirmed by [facility]  MD-I, Talyssa Gibas ([id number]) on 01/14/2014 9:12:18 PM            MDM   patient is status post cholecystectomy. Patient has history of alcoholism. She presents today with pain consistent with  alcoholic pancreatitis with alcoholic hepatitis and thrombocytopenia. Patient is being admitted for medical treatment and then she can have psychiatric evaluation to decide if she needs inpatient or outpatient psychiatric treatment for her alcoholism.    Final diagnoses:  Alcoholism  Alcoholic pancreatitis  Alcoholic hepatitis  Thrombocytopenia    Plan admission   Rolland Porter, MD, Alanson Aly, MD 01/14/14 [phone]

## 2014-01-15 ENCOUNTER — Other Ambulatory Visit (HOSPITAL_COMMUNITY): Payer: BC Managed Care – PPO

## 2014-01-15 DIAGNOSIS — Z8719 Personal history of other diseases of the digestive system: Secondary | ICD-10-CM | POA: Insufficient documentation

## 2014-01-15 DIAGNOSIS — F411 Generalized anxiety disorder: Secondary | ICD-10-CM

## 2014-01-15 DIAGNOSIS — D61818 Other pancytopenia: Secondary | ICD-10-CM

## 2014-01-15 DIAGNOSIS — F10929 Alcohol use, unspecified with intoxication, unspecified: Secondary | ICD-10-CM | POA: Insufficient documentation

## 2014-01-15 DIAGNOSIS — R109 Unspecified abdominal pain: Secondary | ICD-10-CM | POA: Diagnosis present

## 2014-01-15 DIAGNOSIS — F10229 Alcohol dependence with intoxication, unspecified: Secondary | ICD-10-CM

## 2014-01-15 DIAGNOSIS — K859 Acute pancreatitis without necrosis or infection, unspecified: Secondary | ICD-10-CM | POA: Diagnosis present

## 2014-01-15 HISTORY — DX: Other pancytopenia: D61.818

## 2014-01-15 LAB — CBC
HCT: 28.8 % — ABNORMAL LOW (ref 36.0–46.0)
Hemoglobin: 9.3 g/dL — ABNORMAL LOW (ref 12.0–15.0)
MCH: 30.2 pg (ref 26.0–34.0)
MCHC: 32.3 g/dL (ref 30.0–36.0)
MCV: 93.5 fL (ref 78.0–100.0)
PLATELETS: 50 10*3/uL — AB (ref 150–400)
RBC: 3.08 MIL/uL — AB (ref 3.87–5.11)
RDW: 17.9 % — AB (ref 11.5–15.5)
WBC: 3.5 10*3/uL — ABNORMAL LOW (ref 4.0–10.5)

## 2014-01-15 LAB — COMPREHENSIVE METABOLIC PANEL
ALK PHOS: 111 U/L (ref 39–117)
ALT: 49 U/L — ABNORMAL HIGH (ref 0–35)
AST: 203 U/L — ABNORMAL HIGH (ref 0–37)
Albumin: 3.3 g/dL — ABNORMAL LOW (ref 3.5–5.2)
BILIRUBIN TOTAL: 1.6 mg/dL — AB (ref 0.3–1.2)
BUN: 4 mg/dL — AB (ref 6–23)
CHLORIDE: 101 meq/L (ref 96–112)
CO2: 21 meq/L (ref 19–32)
Calcium: 7.8 mg/dL — ABNORMAL LOW (ref 8.4–10.5)
Creatinine, Ser: 0.36 mg/dL — ABNORMAL LOW (ref 0.50–1.10)
GFR calc non Af Amer: >90 mL/min (ref >90)
GLUCOSE: 124 mg/dL — AB (ref 70–99)
POTASSIUM: 3.9 meq/L (ref 3.7–5.3)
Sodium: 138 mEq/L (ref 137–147)
Total Protein: 7.8 g/dL (ref 6.0–8.3)

## 2014-01-15 LAB — ETHANOL: Alcohol, Ethyl (B): 74 mg/dL — ABNORMAL HIGH (ref 0–11)

## 2014-01-15 LAB — PHOSPHORUS: Phosphorus: 3.3 mg/dL (ref 2.3–4.6)

## 2014-01-15 LAB — PROTIME-INR
INR: 1.27 (ref 0.00–1.49)
Prothrombin Time: 15.6 seconds — ABNORMAL HIGH (ref 11.6–15.2)

## 2014-01-15 LAB — LIPASE, BLOOD: Lipase: 85 U/L — ABNORMAL HIGH (ref 11–59)

## 2014-01-15 LAB — TSH: TSH: 2.374 u[IU]/mL (ref 0.350–4.500)

## 2014-01-15 LAB — MAGNESIUM: Magnesium: 1.5 mg/dL (ref 1.5–2.5)

## 2014-01-15 MED ORDER — LORAZEPAM 2 MG/ML IJ SOLN: 0.0000 mg | Freq: Two times a day (BID) | INTRAMUSCULAR | Status: DC

## 2014-01-15 MED ORDER — PANTOPRAZOLE SODIUM 40 MG IV SOLR
40.0000 mg | Freq: Two times a day (BID) | INTRAVENOUS | Status: DC
  Administered 2014-01-15 (×2): 40 mg via INTRAVENOUS
  Filled 2014-01-15 (×4): qty 40

## 2014-01-15 MED ORDER — LORAZEPAM 2 MG/ML IJ SOLN
1.0000 mg | Freq: Three times a day (TID) | INTRAMUSCULAR | Status: DC
  Administered 2014-01-15: 1 mg via INTRAVENOUS
  Filled 2014-01-15: qty 1

## 2014-01-15 MED ORDER — HYDROMORPHONE HCL PF 1 MG/ML IJ SOLN
0.5000 mg | INTRAMUSCULAR | Status: DC | PRN
  Administered 2014-01-15 – 2014-01-16 (×2): 0.5 mg via INTRAVENOUS
  Filled 2014-01-15 (×4): qty 1

## 2014-01-15 MED ORDER — VITAMIN B-1 100 MG PO TABS
100.0000 mg | ORAL_TABLET | Freq: Every day | ORAL | Status: DC
  Filled 2014-01-15: qty 1

## 2014-01-15 MED ORDER — ACETAMINOPHEN 325 MG PO TABS: 650.0000 mg | ORAL_TABLET | Freq: Four times a day (QID) | ORAL | Status: DC | PRN

## 2014-01-15 MED ORDER — ONDANSETRON HCL 4 MG PO TABS: 4.0000 mg | ORAL_TABLET | Freq: Four times a day (QID) | ORAL | Status: DC | PRN

## 2014-01-15 MED ORDER — LORAZEPAM 1 MG PO TABS
0.0000 mg | ORAL_TABLET | Freq: Two times a day (BID) | ORAL | Status: DC
  Filled 2014-01-15: qty 2

## 2014-01-15 MED ORDER — LORAZEPAM 1 MG PO TABS
1.0000 mg | ORAL_TABLET | Freq: Four times a day (QID) | ORAL | Status: DC | PRN
  Administered 2014-01-15: 1 mg via ORAL
  Filled 2014-01-15: qty 1

## 2014-01-15 MED ORDER — LORAZEPAM 2 MG/ML IJ SOLN: 0.0000 mg | Freq: Four times a day (QID) | INTRAMUSCULAR | Status: DC

## 2014-01-15 MED ORDER — FOLIC ACID 1 MG PO TABS
1.0000 mg | ORAL_TABLET | Freq: Every day | ORAL | Status: DC
  Administered 2014-01-15: 1 mg via ORAL
  Filled 2014-01-15 (×2): qty 1

## 2014-01-15 MED ORDER — ONDANSETRON HCL 4 MG/2ML IJ SOLN
4.0000 mg | Freq: Four times a day (QID) | INTRAMUSCULAR | Status: DC | PRN
  Administered 2014-01-15 (×3): 4 mg via INTRAVENOUS
  Filled 2014-01-15 (×3): qty 2

## 2014-01-15 MED ORDER — SODIUM CHLORIDE 0.9 % IV SOLN
INTRAVENOUS | Status: DC
  Administered 2014-01-15 – 2014-01-16 (×4): via INTRAVENOUS

## 2014-01-15 MED ORDER — LORAZEPAM 2 MG/ML IJ SOLN
1.0000 mg | Freq: Four times a day (QID) | INTRAMUSCULAR | Status: DC | PRN
  Administered 2014-01-15 – 2014-01-16 (×2): 1 mg via INTRAVENOUS
  Filled 2014-01-15 (×3): qty 1

## 2014-01-15 MED ORDER — PROMETHAZINE HCL 25 MG/ML IJ SOLN
12.5000 mg | Freq: Four times a day (QID) | INTRAMUSCULAR | Status: DC | PRN
  Administered 2014-01-15: 12.5 mg via INTRAVENOUS
  Filled 2014-01-15: qty 1

## 2014-01-15 MED ORDER — LORAZEPAM 1 MG PO TABS
0.0000 mg | ORAL_TABLET | Freq: Four times a day (QID) | ORAL | Status: DC
  Administered 2014-01-15: 1 mg via ORAL
  Administered 2014-01-15: 2 mg via ORAL
  Filled 2014-01-15: qty 1

## 2014-01-15 MED ORDER — VITAMIN B-1 100 MG PO TABS
100.0000 mg | ORAL_TABLET | Freq: Every day | ORAL | Status: DC
  Administered 2014-01-15: 100 mg via ORAL
  Filled 2014-01-15: qty 1

## 2014-01-15 MED ORDER — OXYCODONE HCL 5 MG PO TABS
5.0000 mg | ORAL_TABLET | ORAL | Status: DC | PRN
  Administered 2014-01-15 (×3): 5 mg via ORAL
  Filled 2014-01-15 (×3): qty 1

## 2014-01-15 MED ORDER — THIAMINE HCL 100 MG/ML IJ SOLN
100.0000 mg | Freq: Every day | INTRAMUSCULAR | Status: DC
  Filled 2014-01-15: qty 1

## 2014-01-15 MED ORDER — ADULT MULTIVITAMIN W/MINERALS CH
1.0000 | ORAL_TABLET | Freq: Every day | ORAL | Status: DC
  Administered 2014-01-15: 1 via ORAL
  Filled 2014-01-15 (×2): qty 1

## 2014-01-15 NOTE — Progress Notes (Signed)
Ur completed     

## 2014-01-15 NOTE — Progress Notes (Addendum)
PROGRESS NOTE    Sara [name] CHE:[id number] DOB: [birth year] DOA: 01/14/2014 PCP: Leandrew Koyanagi, MD  HPI/Brief narrative 38 year old female with history of alcohol dependence, recently admitted to [facility] in November 2014 for detox, has been drinking 2 bottles of white wine daily for approximately 2 months, presented with epigastric abdominal pain radiating to back, nausea and nonbloody emesis associated with poor oral intake. She was seen in an urgent care and apparently abdominal pain improved after GI cocktail. In the ED, blood alcohol levels greater than 200, mildly elevated lipase. Hospitalist admission requested.   Assessment/Plan:  1. Abdominal pain: DD-alcohol related gastritis/esophagitis/GERD/alcoholic hepatitis/?? Pancreatitis-seems less likely given minimally elevated lipase. Start on oral clear liquids and advance diet as tolerated. IV PPI and when necessary antiemetics. Counseled extensively regarding abstinence from alcohol abuse. She denies NSAID use but is listed on home medication list. She states that she underwent EGD by Dr. Erskine Emery approximately 3 years ago and was told to have alcohol-related gastritis. If not improving, may consider GI consultation. Check urine pregnancy test. 2. Acute alcoholic intoxication/alcohol dependence/at risk for severe withdrawal: Admit to telemetry. Apparently had some altered mental status earlier during admission which has since resolved. No overt features of withdrawal currently. Continue Ativan by CIWA protocol and vitamins. Patient states that she drinks alcohol to relieve anxiety. Denies depression, suicidal or homicidal ideations. 3. Pancytopenia: Likely secondary to alcohol abuse. No bleeding. Follow CBCs. INR: 1.27 4. Anxiety and? Depression: Patient denies feeling sad or suicidal or homicidal ideations. May consider psychiatric consultation at some point. 5. Possible alcoholic cirrhosis: By ultrasound  06/30/2013   Code Status: Full Family Communication: None at bedside Disposition Plan: Home when medically stable.   Consultants:  None  Procedures:  None  Antibiotics:  None   Subjective: Nausea and episode of nonbloody emesis this morning. Epigastric/upper abdominal pain. No diarrhea. Denies chest pain or dyspnea. Feels about the same compared to last night.  Objective: Filed Vitals:   01/15/14 0558 01/15/14 0600 01/15/14 0630 01/15/14 1007  BP: 108/58 122/79 122/77 127/66  Pulse: 105 94 103 98  Temp:    98.9 F (37.2 C)  TempSrc:    Oral  Resp:  23 18 18   SpO2:  97% 95% 96%    Intake/Output Summary (Last 24 hours) at 01/15/14 1029 Last data filed at 01/14/14 2231  Gross per 24 hour  Intake   1000 ml  Output      0 ml  Net   1000 ml   There were no vitals filed for this visit.   Exam:  General exam: Moderately built and obese female lying comfortably supine on the gurney. Respiratory system: Clear. No increased work of breathing. Cardiovascular system: S1 & S2 heard, RRR. No JVD, murmurs, gallops, clicks or pedal edema. Gastrointestinal system: Abdomen is nondistended, soft and mild tenderness in the epigastric region without peritoneal signs. Normal bowel sounds heard. Central nervous system: Alert and oriented. No focal neurological deficits. Extremities: Symmetric 5 x 5 power. Not tremulous or anxious at this time. Psychiatry: Pleasant and cooperative. Does not appear anxious   Data Reviewed: Basic Metabolic Panel:  Recent Labs Lab 01/14/14 1921 01/15/14 0430  NA 141 138  K 3.9 3.9  CL 100 101  CO2 26 21  GLUCOSE 108* 124*  BUN 5* 4*  CREATININE 0.43* 0.36*  CALCIUM 9.3 7.8*  MG  --  1.5  PHOS  --  3.3   Liver Function Tests:  Recent Labs Lab 01/14/14  2233 01/15/14 0430  AST 207* 203*  ALT 51* 49*  ALKPHOS 118* 111  BILITOT 1.6* 1.6*  PROT 7.8 7.8  ALBUMIN 3.4* 3.3*    Recent Labs Lab 01/14/14 1921 01/15/14 0430  LIPASE  136* 85*   No results found for this basename: AMMONIA,  in the last 168 hours CBC:  Recent Labs Lab 01/14/14 1921 01/15/14 0430  WBC 3.4* 3.5*  HGB 10.5* 9.3*  HCT 32.7* 28.8*  MCV 92.4 93.5  PLT 63* 50*   Cardiac Enzymes: No results found for this basename: CKTOTAL, CKMB, CKMBINDEX, TROPONINI,  in the last 168 hours BNP (last 3 results) No results found for this basename: PROBNP,  in the last 8760 hours CBG: No results found for this basename: GLUCAP,  in the last 168 hours  No results found for this or any previous visit (from the past 240 hour(s)).    Additional labs: 1. TSH: 2.374 2. UDS: Positive for barbiturates. 3. Blood alcohol level: 200 > 74     Studies: No results found.      Scheduled Meds: . folic acid  1 mg Oral Daily  . LORazepam  0-4 mg Intravenous 4 times per day  . LORazepam  0-4 mg Intravenous Q12H  . LORazepam  0-4 mg Oral 4 times per day  . LORazepam  0-4 mg Oral Q12H  . multivitamin with minerals  1 tablet Oral Daily  . thiamine  100 mg Intravenous Daily  . thiamine  100 mg Oral Daily  . thiamine  100 mg Oral Daily   Continuous Infusions: . sodium chloride 75 mL/hr at 01/15/14 0438    Active Problems:   Alcohol intoxication   Pancreatitis    Time spent: 57 minutes    Constantino Starace, MD, FACP, FHM. [facility] Pager [phone]  If 7PM-7AM, please contact night-coverage [email or web address] Password [email or web address] 01/15/2014, 10:29 AM    LOS: 1 day

## 2014-01-15 NOTE — ED Notes (Signed)
Hold lab draw at this time per RN. 

## 2014-01-15 NOTE — Progress Notes (Signed)
Agree with the previous RN's assessment. Will continue to monitor patient.Sara [name], [name]  

## 2014-01-15 NOTE — Care Management Note (Addendum)
    Page 1 of 1   01/15/2014     1:16:06 PM   CARE MANAGEMENT NOTE 01/15/2014  Patient:  Sara [name], Sara [name]   Account Number:  [id number]  Date Initiated:  01/15/2014  Documentation initiated by:  Dessa Phi  Subjective/Objective Assessment:   38 Y/O F ADMITTED W/ETOH INTOXICATION.SV:XBLT ABUSE.     Action/Plan:   FROM HOME.HAS PCP,PHARMACY.   Anticipated DC Date:  01/17/2014   Anticipated DC Plan:  [place]  CM consult      Choice offered to / List presented to:             Status of service:  In process, will continue to follow Medicare Important Message given?   (If response is "NO", the following Medicare IM given date fields will be blank) Date Medicare IM given:   Date Additional Medicare IM given:    Discharge Disposition:    Per UR Regulation:  Reviewed for med. necessity/level of care/duration of stay  If discussed at [place] of Stay Meetings, dates discussed:    Comments:  01/15/14 [facility] RN,BSN NCM [phone]

## 2014-01-15 NOTE — Progress Notes (Signed)
Addendum  Patient apparently continued to vomit despite when necessary IV Zofran. Added low dose IV Phenergan when necessary for intractable nausea and vomiting. Also added scheduled Ativan 1 mg IV 3 times a day for alcohol withdrawal which can be tapered down or stopped once she starts improving.  Vernell Leep, MD, FACP, FHM. [facility] Pager [phone]  If 7PM-7AM, please contact night-coverage [email or web address] Password [facility] 01/15/2014, 6:39 PM

## 2014-01-16 DIAGNOSIS — F1010 Alcohol abuse, uncomplicated: Secondary | ICD-10-CM

## 2014-01-16 LAB — COMPREHENSIVE METABOLIC PANEL
ALT: 40 U/L — AB (ref 0–35)
AST: 143 U/L — ABNORMAL HIGH (ref 0–37)
Albumin: 3.1 g/dL — ABNORMAL LOW (ref 3.5–5.2)
Alkaline Phosphatase: 108 U/L (ref 39–117)
BILIRUBIN TOTAL: 2.9 mg/dL — AB (ref 0.3–1.2)
BUN: 7 mg/dL (ref 6–23)
CALCIUM: 8.8 mg/dL (ref 8.4–10.5)
CHLORIDE: 97 meq/L (ref 96–112)
CO2: 25 meq/L (ref 19–32)
CREATININE: 0.48 mg/dL — AB (ref 0.50–1.10)
GFR calc Af Amer: >90 mL/min (ref >90)
GLUCOSE: 81 mg/dL (ref 70–99)
Potassium: 3.4 mEq/L — ABNORMAL LOW (ref 3.7–5.3)
Sodium: 135 mEq/L — ABNORMAL LOW (ref 137–147)
Total Protein: 7.2 g/dL (ref 6.0–8.3)

## 2014-01-16 LAB — CBC
HCT: 28.8 % — ABNORMAL LOW (ref 36.0–46.0)
HEMOGLOBIN: 9.1 g/dL — AB (ref 12.0–15.0)
MCH: 29.6 pg (ref 26.0–34.0)
MCHC: 31.6 g/dL (ref 30.0–36.0)
MCV: 93.8 fL (ref 78.0–100.0)
Platelets: 40 10*3/uL — ABNORMAL LOW (ref 150–400)
RBC: 3.07 MIL/uL — AB (ref 3.87–5.11)
RDW: 17.2 % — ABNORMAL HIGH (ref 11.5–15.5)
WBC: 3.1 10*3/uL — AB (ref 4.0–10.5)

## 2014-01-16 LAB — LIPASE, BLOOD: Lipase: 60 U/L — ABNORMAL HIGH (ref 11–59)

## 2014-01-16 MED ORDER — ADULT MULTIVITAMIN W/MINERALS CH: 1.0000 | ORAL_TABLET | Freq: Every day | ORAL | Status: DC

## 2014-01-16 MED ORDER — OXYCODONE HCL 5 MG PO TABS: 5.0000 mg | ORAL_TABLET | ORAL | Status: DC | PRN

## 2014-01-16 MED ORDER — PANTOPRAZOLE SODIUM 40 MG PO TBEC: 40.0000 mg | DELAYED_RELEASE_TABLET | Freq: Every day | ORAL | Status: DC

## 2014-01-16 MED ORDER — THIAMINE HCL 100 MG PO TABS: 100.0000 mg | ORAL_TABLET | Freq: Every day | ORAL | Status: DC

## 2014-01-16 MED ORDER — FOLIC ACID 1 MG PO TABS: 1.0000 mg | ORAL_TABLET | Freq: Every day | ORAL | Status: DC

## 2014-01-16 MED ORDER — PROMETHAZINE HCL 25 MG/ML IJ SOLN: 6.2500 mg | Freq: Four times a day (QID) | INTRAMUSCULAR | Status: DC | PRN

## 2014-01-16 MED ORDER — ONDANSETRON HCL 4 MG PO TABS: 4.0000 mg | ORAL_TABLET | Freq: Four times a day (QID) | ORAL | Status: DC | PRN

## 2014-01-16 NOTE — Discharge Summary (Signed)
Physician Discharge Summary  Sara [name] VHQ:[id number] DOB: 19-May-1976 DOA: 01/14/2014  PCP: Leandrew Koyanagi, MD  Admit date: 01/14/2014 Discharge date: 01/16/2014  Time spent: 45 minutes  Recommendations for Outpatient Follow-up:  -Will be discharged home today in stable condition. -Advised to followup with PCP in 2 weeks.   Discharge Diagnoses:  Principal Problem:   Abdominal pain Active Problems:   Anxiety state, unspecified   Depression   GERD (gastroesophageal reflux disease)   Alcohol abuse with intoxication   Pancreatitis   Pancytopenia   Discharge Condition: Stable and improved  Filed Weights   01/15/14 1210  Weight: 84 kg (185 lb 3 oz)    History of present illness:  Pt is 38 yo female with history of alcohol abuse [facility], started to drink about 2 bottles of wine daily for two weeks now. Currently with epigastric abdominal pain, radiating to the back, 10/10 in severity, constant and sharp, no specific alleviating factors, associated with nausea and non blood vomiting, poor oral intake. She denies chest pain, no fevers, chills, no shortness of breath.  In ED, pt intoxicated, alcohol level > 200, elevated lipase. Hospitalist admission was requested.   Hospital Course:   Abdominal Pain/N/V -Resolved. -?mild acute pancreatitis vs alcoholic gastritis. -Tolerating solid diet. -Continue daily PPI.  Acute Alcohol Intoxication -She drinks about 2 bottles of wine every night. -No signs of DTs. -Has a substance abuse counselor in the OP setting. -Not interested in further ETOH resources at this time.  Pancytopenia -2/2 ETOH abuse.  Transaminitis -2/2 ETOH abuse. -Typical AST:ALT ration.  Procedures:  none   Consultations:  None  Discharge Instructions  Discharge Orders   Future Orders Complete By Expires   Discontinue IV  As directed    Increase activity slowly  As directed        Medication List     STOP taking these medications       ibuprofen 200 MG tablet  Commonly known as:  ADVIL,MOTRIN     ranitidine 150 MG tablet  Commonly known as:  ZANTAC      TAKE these medications       folic acid 1 MG tablet  Commonly known as:  FOLVITE  Take 1 tablet (1 mg total) by mouth daily.     gi cocktail Susp suspension  Take 30 mLs by mouth 2 (two) times daily as needed for indigestion. Shake well.     multivitamin with minerals Tabs tablet  Take 1 tablet by mouth daily.     ondansetron 4 MG tablet  Commonly known as:  ZOFRAN  Take 1 tablet (4 mg total) by mouth every 6 (six) hours as needed for nausea.     oxyCODONE 5 MG immediate release tablet  Commonly known as:  Oxy IR/ROXICODONE  Take 1 tablet (5 mg total) by mouth every 4 (four) hours as needed for moderate pain.     pantoprazole 40 MG tablet  Commonly known as:  PROTONIX  Take 1 tablet (40 mg total) by mouth daily.     thiamine 100 MG tablet  Take 1 tablet (100 mg total) by mouth daily.       Allergies  Allergen Reactions  . Morphine And Related     Slowed HR, lowered BP       Follow-up Information   Follow up with DOOLITTLE, Linton Ham, MD. Schedule an appointment as soon as possible for a visit in 2 weeks.   Specialties:  Internal Medicine, Adolescent Medicine   Contact information:   [place] [address] [phone]        The results of significant diagnostics from this hospitalization (including imaging, microbiology, ancillary and laboratory) are listed below for reference.    Significant Diagnostic Studies: No results found.  Microbiology: No results found for this or any previous visit (from the past 240 hour(s)).   Labs: Basic Metabolic Panel:  Recent Labs Lab 01/14/14 1921 01/15/14 0430 01/16/14 0430  NA 141 138 135*  K 3.9 3.9 3.4*  CL 100 101 97  CO2 26 21 25   GLUCOSE 108* 124* 81  BUN 5* 4* 7  CREATININE 0.43* 0.36* 0.48*  CALCIUM 9.3 7.8* 8.8  MG  --  1.5   --   PHOS  --  3.3  --    Liver Function Tests:  Recent Labs Lab 01/14/14 2233 01/15/14 0430 01/16/14 0430  AST 207* 203* 143*  ALT 51* 49* 40*  ALKPHOS 118* 111 108  BILITOT 1.6* 1.6* 2.9*  PROT 7.8 7.8 7.2  ALBUMIN 3.4* 3.3* 3.1*    Recent Labs Lab 01/14/14 1921 01/15/14 0430 01/16/14 0430  LIPASE 136* 85* 60*   No results found for this basename: AMMONIA,  in the last 168 hours CBC:  Recent Labs Lab 01/14/14 1921 01/15/14 0430 01/16/14 0430  WBC 3.4* 3.5* 3.1*  HGB 10.5* 9.3* 9.1*  HCT 32.7* 28.8* 28.8*  MCV 92.4 93.5 93.8  PLT 63* 50* 40*   Cardiac Enzymes: No results found for this basename: CKTOTAL, CKMB, CKMBINDEX, TROPONINI,  in the last 168 hours BNP: BNP (last 3 results) No results found for this basename: PROBNP,  in the last 8760 hours CBG: No results found for this basename: GLUCAP,  in the last 168 hours     Signed:  Lelon Frohlich  [facility] Pager: [phone] 01/16/2014, 10:16 AM

## 2014-02-01 ENCOUNTER — Emergency Department (HOSPITAL_COMMUNITY)
Admission: EM | Admit: 2014-02-01 | Discharge: 2014-02-02 | Disposition: A | Discharge status: Other Acute Inpatient Hospital | Payer: BC Managed Care – PPO | Attending: Emergency Medicine | Admitting: Emergency Medicine

## 2014-02-01 ENCOUNTER — Encounter (HOSPITAL_COMMUNITY): Payer: Self-pay | Admitting: Emergency Medicine

## 2014-02-01 ENCOUNTER — Emergency Department (HOSPITAL_COMMUNITY)
Admission: EM | Admit: 2014-02-01 | Discharge: 2014-02-01 | Disposition: A | Discharge status: Home / Self Care | Payer: BC Managed Care – PPO | Attending: Emergency Medicine | Admitting: Emergency Medicine

## 2014-02-01 DIAGNOSIS — F1020 Alcohol dependence, uncomplicated: Secondary | ICD-10-CM | POA: Diagnosis present

## 2014-02-01 DIAGNOSIS — Z87891 Personal history of nicotine dependence: Secondary | ICD-10-CM | POA: Insufficient documentation

## 2014-02-01 DIAGNOSIS — F10929 Alcohol use, unspecified with intoxication, unspecified: Secondary | ICD-10-CM

## 2014-02-01 DIAGNOSIS — G479 Sleep disorder, unspecified: Secondary | ICD-10-CM

## 2014-02-01 DIAGNOSIS — F3289 Other specified depressive episodes: Secondary | ICD-10-CM | POA: Insufficient documentation

## 2014-02-01 DIAGNOSIS — K219 Gastro-esophageal reflux disease without esophagitis: Secondary | ICD-10-CM | POA: Insufficient documentation

## 2014-02-01 DIAGNOSIS — F411 Generalized anxiety disorder: Secondary | ICD-10-CM | POA: Insufficient documentation

## 2014-02-01 DIAGNOSIS — Z0289 Encounter for other administrative examinations: Secondary | ICD-10-CM | POA: Insufficient documentation

## 2014-02-01 DIAGNOSIS — D696 Thrombocytopenia, unspecified: Secondary | ICD-10-CM

## 2014-02-01 DIAGNOSIS — K766 Portal hypertension: Secondary | ICD-10-CM | POA: Insufficient documentation

## 2014-02-01 DIAGNOSIS — F10229 Alcohol dependence with intoxication, unspecified: Secondary | ICD-10-CM | POA: Insufficient documentation

## 2014-02-01 DIAGNOSIS — Z79899 Other long term (current) drug therapy: Secondary | ICD-10-CM | POA: Insufficient documentation

## 2014-02-01 DIAGNOSIS — F329 Major depressive disorder, single episode, unspecified: Secondary | ICD-10-CM | POA: Insufficient documentation

## 2014-02-01 DIAGNOSIS — K7460 Unspecified cirrhosis of liver: Secondary | ICD-10-CM | POA: Insufficient documentation

## 2014-02-01 DIAGNOSIS — F4310 Post-traumatic stress disorder, unspecified: Secondary | ICD-10-CM | POA: Diagnosis present

## 2014-02-01 DIAGNOSIS — F341 Dysthymic disorder: Secondary | ICD-10-CM

## 2014-02-01 LAB — COMPREHENSIVE METABOLIC PANEL
ALBUMIN: 3.5 g/dL (ref 3.5–5.2)
ALK PHOS: 124 U/L — AB (ref 39–117)
ALT: 55 U/L — AB (ref 0–35)
AST: 232 U/L — ABNORMAL HIGH (ref 0–37)
BUN: 5 mg/dL — AB (ref 6–23)
CO2: 23 mEq/L (ref 19–32)
Calcium: 8.7 mg/dL (ref 8.4–10.5)
Chloride: 97 mEq/L (ref 96–112)
Creatinine, Ser: 0.4 mg/dL — ABNORMAL LOW (ref 0.50–1.10)
GFR calc Af Amer: >90 mL/min (ref >90)
GFR calc non Af Amer: >90 mL/min (ref >90)
Glucose, Bld: 103 mg/dL — ABNORMAL HIGH (ref 70–99)
POTASSIUM: 4 meq/L (ref 3.7–5.3)
SODIUM: 137 meq/L (ref 137–147)
TOTAL PROTEIN: 8.5 g/dL — AB (ref 6.0–8.3)
Total Bilirubin: 1.6 mg/dL — ABNORMAL HIGH (ref 0.3–1.2)

## 2014-02-01 LAB — URINALYSIS, ROUTINE W REFLEX MICROSCOPIC
BILIRUBIN URINE: NEGATIVE
GLUCOSE, UA: NEGATIVE mg/dL
Hgb urine dipstick: NEGATIVE
KETONES UR: NEGATIVE mg/dL
NITRITE: NEGATIVE
Protein, ur: NEGATIVE mg/dL
Specific Gravity, Urine: 1.01 (ref 1.005–1.030)
Urobilinogen, UA: 1 mg/dL (ref 0.0–1.0)
pH: 6 (ref 5.0–8.0)

## 2014-02-01 LAB — CBC
HCT: 31.7 % — ABNORMAL LOW (ref 36.0–46.0)
Hemoglobin: 10.4 g/dL — ABNORMAL LOW (ref 12.0–15.0)
MCH: 30.6 pg (ref 26.0–34.0)
MCHC: 32.8 g/dL (ref 30.0–36.0)
MCV: 93.2 fL (ref 78.0–100.0)
Platelets: 56 10*3/uL — ABNORMAL LOW (ref 150–400)
RBC: 3.4 MIL/uL — ABNORMAL LOW (ref 3.87–5.11)
RDW: 17.6 % — AB (ref 11.5–15.5)
WBC: 5.6 10*3/uL (ref 4.0–10.5)

## 2014-02-01 LAB — SALICYLATE LEVEL: Salicylate Lvl: <2 mg/dL — ABNORMAL LOW (ref 2.8–20.0)

## 2014-02-01 LAB — URINE MICROSCOPIC-ADD ON

## 2014-02-01 LAB — RAPID URINE DRUG SCREEN, HOSP PERFORMED
Amphetamines: NOT DETECTED
BARBITURATES: NOT DETECTED
BENZODIAZEPINES: NOT DETECTED
Cocaine: POSITIVE — AB
OPIATES: NOT DETECTED
Tetrahydrocannabinol: NOT DETECTED

## 2014-02-01 LAB — ETHANOL: ALCOHOL ETHYL (B): 325 mg/dL — AB (ref 0–11)

## 2014-02-01 LAB — ACETAMINOPHEN LEVEL

## 2014-02-01 MED ORDER — THIAMINE HCL 100 MG/ML IJ SOLN: 100.0000 mg | Freq: Every day | INTRAMUSCULAR | Status: DC

## 2014-02-01 MED ORDER — ZOLPIDEM TARTRATE 5 MG PO TABS: 5.0000 mg | ORAL_TABLET | Freq: Every evening | ORAL | Status: DC | PRN

## 2014-02-01 MED ORDER — LORAZEPAM 1 MG PO TABS
0.0000 mg | ORAL_TABLET | Freq: Four times a day (QID) | ORAL | Status: DC
  Administered 2014-02-02: 2 mg via ORAL
  Filled 2014-02-01: qty 2

## 2014-02-01 MED ORDER — LORAZEPAM 1 MG PO TABS
1.0000 mg | ORAL_TABLET | Freq: Once | ORAL | Status: AC
  Administered 2014-02-01: 1 mg via ORAL
  Filled 2014-02-01: qty 1

## 2014-02-01 MED ORDER — ONDANSETRON HCL 4 MG PO TABS: 4.0000 mg | ORAL_TABLET | Freq: Three times a day (TID) | ORAL | Status: DC | PRN

## 2014-02-01 MED ORDER — LORAZEPAM 1 MG PO TABS: 0.0000 mg | ORAL_TABLET | Freq: Two times a day (BID) | ORAL | Status: DC

## 2014-02-01 MED ORDER — IBUPROFEN 200 MG PO TABS: 600.0000 mg | ORAL_TABLET | Freq: Three times a day (TID) | ORAL | Status: DC | PRN

## 2014-02-01 MED ORDER — NICOTINE 21 MG/24HR TD PT24: 21.0000 mg | MEDICATED_PATCH | Freq: Every day | TRANSDERMAL | Status: DC

## 2014-02-01 MED ORDER — VITAMIN B-1 100 MG PO TABS: 100.0000 mg | ORAL_TABLET | Freq: Every day | ORAL | Status: DC

## 2014-02-01 NOTE — ED Notes (Signed)
Pt states she wants detox from alcohol,  [facility] went back into the environment she was living and began drinking heavily again,  Patient states she has been drinking for 12 years,  Her family moved her out from her husband and kids today into their house,  Also admits to depression and says she is working with a therapist for depression presently,  Pt is very evasive when ask about SI and HI but states no .

## 2014-02-01 NOTE — ED Provider Notes (Signed)
Medical screening examination/treatment/procedure(s) were performed by non-physician practitioner and as supervising physician I was immediately available for consultation/collaboration.   EKG Interpretation None       Ojas Coone M Shadow Schedler, MD 02/01/14 0724 

## 2014-02-01 NOTE — Discharge Instructions (Signed)
Alcohol Intoxication °Alcohol intoxication occurs when the amount of alcohol that a person has consumed impairs his or her ability to mentally and physically function. Alcohol directly impairs the normal chemical activity of the brain. Drinking large amounts of alcohol can lead to changes in mental function and behavior, and it can cause many physical effects that can be harmful.  °Alcohol intoxication can range in severity from mild to very severe. Various factors can affect the level of intoxication that occurs, such as the person's age, gender, weight, frequency of alcohol consumption, and the presence of other medical conditions (such as diabetes, seizures, or heart conditions). Dangerous levels of alcohol intoxication may occur when people drink large amounts of alcohol in a short period (binge drinking). Alcohol can also be especially dangerous when combined with certain prescription medicines or "recreational" drugs. °SIGNS AND SYMPTOMS °Some common signs and symptoms of mild alcohol intoxication include: °· Loss of coordination. °· Changes in mood and behavior. °· Impaired judgment. °· Slurred speech. °As alcohol intoxication progresses to more severe levels, other signs and symptoms will appear. These may include: °· Vomiting. °· Confusion and impaired memory. °· Slowed breathing. °· Seizures. °· Loss of consciousness. °DIAGNOSIS  °Your health care provider will take a medical history and perform a physical exam. You will be asked about the amount and type of alcohol you have consumed. Blood tests will be done to measure the concentration of alcohol in your blood. In many places, your blood alcohol level must be lower than 80 mg/dL (0.08%) to legally drive. However, many dangerous effects of alcohol can occur at much lower levels.  °TREATMENT  °People with alcohol intoxication often do not require treatment. Most of the effects of alcohol intoxication are temporary, and they go away as the alcohol naturally  leaves the body. Your health care provider will monitor your condition until you are stable enough to go home. Fluids are sometimes given through an IV access tube to help prevent dehydration.  °HOME CARE INSTRUCTIONS °· Do not drive after drinking alcohol. °· Stay hydrated. Drink enough water and fluids to keep your urine clear or pale yellow. Avoid caffeine.   °· Only take over-the-counter or prescription medicines as directed by your health care provider.   °SEEK MEDICAL CARE IF:  °· You have persistent vomiting.   °· You do not feel better after a few days. °· You have frequent alcohol intoxication. Your health care provider can help determine if you should see a substance use treatment counselor. °SEEK IMMEDIATE MEDICAL CARE IF:  °· You become shaky or tremble when you try to stop drinking.   °· You shake uncontrollably (seizure).   °· You throw up (vomit) blood. This may be bright red or may look like black coffee grounds.   °· You have blood in your stool. This may be bright red or may appear as a black, tarry, bad smelling stool.   °· You become lightheaded or faint.   °MAKE SURE YOU:  °· Understand these instructions. °· Will watch your condition. °· Will get help right away if you are not doing well or get worse. °Document Released: 08/17/2005 Document Revised: 07/10/2013 Document Reviewed: 04/12/2013 °[employer]® Patient Information ©2014 [employer]. ° °
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
## 2014-02-01 NOTE — BH Assessment (Signed)
Contacted Dr. Vanita Panda who reported PA has seen pt. Riki Rusk, PA to gather report.  Assessment to be initiated.   Fredonia Highland, MSW, LCSW Triage Specialist [phone]

## 2014-02-01 NOTE — ED Notes (Signed)
Pt arrived to unit, drowsy but able to carry on conversation. Pt denies SI/HI and states she is here for detox for alcohol. No distress noted.
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
## 2014-02-01 NOTE — ED Provider Notes (Signed)
CSN: [id number]     Arrival date & time 02/01/14  1932 History  This chart was scribed for non-physician practitioner Hazel Sams, PA-C working with Carmin Muskrat, MD by Eston Mould, ED Scribe. This patient was seen in room WTR4/WLPT4 and the patient's care was started at 9:28 PM .   Chief Complaint  Patient presents with  . Medical Clearance   The history is provided by the patient. No language interpreter was used.  HPI Comments: Sara [name] is a 38 y.o. female with a hx of alcoholism, anxiety, depression, cirrhosis of the liver, and easy bruising who presents to the Emergency Department requesting alcohol detox. Pt was here in the ED this morning (1am) due to intoxication and is now requesting detox. Pt states she has been an alcohol drinker for the past 12 years. She states she drinks about 2-3 bottles of wine. Pt states the last time she drank was about 6 hours ago. She states she is slightly depressed. Denies any other alcohol use such as beer or alcohols. She reports using cocaine last night but denies this being normal. Pt reports having allergies with morphine (BP). Pt denies SI and HI. Pt denies feeling violent. Pt denies fever, chills, cough, and CP.   Past Medical History  Diagnosis Date  . Alcoholism   . Anxiety   . Depression   . Pneumonia   . GERD (gastroesophageal reflux disease)   . Cirrhosis   . Portal hypertension   . S/P alcohol detoxification     2-3 days at behavioral health previously   Past Surgical History  Procedure Laterality Date  . Cholecystectomy     Family History  Problem Relation Age of Onset  . Colon polyps Mother   . Hypertension Mother   . Thyroid disease Mother   . Alcoholism Mother   . Alcoholism Father   . Alcohol abuse Maternal Grandfather   . Alcohol abuse Paternal Grandfather   . Alcohol abuse Paternal Aunt   . Alcohol abuse Maternal Uncle    History  Substance Use Topics  . Smoking status: Former Research scientist (life sciences)  .  Smokeless tobacco: Never Used  . Alcohol Use: 42.0 oz/week    70 Glasses of wine per week     Comment: reports 3 bottle of wine and pint of or more of vodka daily. 2 bottles of wine currently and pint of vodka some days for at least 4-5 days.a month.     OB History   Grav Para Term Preterm Abortions TAB SAB Ect Mult Living            2     Review of Systems  Constitutional: Negative for fever and chills.  Respiratory: Negative for cough.   Cardiovascular: Negative for chest pain.  Psychiatric/Behavioral: Negative for suicidal ideas.    Allergies  Morphine and related  Home Medications   Current Outpatient Rx  Name  Route  Sig  Dispense  Refill  . folic acid (FOLVITE) 1 MG tablet   Oral   Take 1 tablet (1 mg total) by mouth daily.         . Multiple Vitamin (MULTIVITAMIN WITH MINERALS) TABS tablet   Oral   Take 1 tablet by mouth daily.         . ondansetron (ZOFRAN) 4 MG tablet   Oral   Take 1 tablet (4 mg total) by mouth every 6 (six) hours as needed for nausea.   20 tablet   0   . oxyCODONE (OXY  IR/ROXICODONE) 5 MG immediate release tablet   Oral   Take 1 tablet (5 mg total) by mouth every 4 (four) hours as needed for moderate pain.   30 tablet   0   . pantoprazole (PROTONIX) 40 MG tablet   Oral   Take 1 tablet (40 mg total) by mouth daily.   30 tablet   1    BP 124/81  Pulse 93  Temp(Src) 98.4 F (36.9 C)  Resp 20  SpO2 94%  LMP 01/22/2014  Physical Exam  Nursing note and vitals reviewed. Constitutional: She is oriented to person, place, and time. She appears well-developed and well-nourished. No distress.  HENT:  Head: Normocephalic and atraumatic.  Eyes: Pupils are equal, round, and reactive to light.  Neck: Normal range of motion.  Cardiovascular: Normal rate, regular rhythm and normal heart sounds.   Pulmonary/Chest: Effort normal and breath sounds normal. No respiratory distress.  Abdominal: Soft. There is no tenderness.   Musculoskeletal: Normal range of motion.  Neurological: She is alert and oriented to person, place, and time.  Skin: Skin is warm and dry.  Psychiatric: She has a normal mood and affect. Her behavior is normal.    ED Course  Procedures  DIAGNOSTIC STUDIES: Oxygen Saturation is 94% on RA, adequate by my interpretation.    COORDINATION OF CARE: 9:33 PM-patient seen and evaluated. Patient well-appearing no acute distress. Denies SI or HI. Discussed treatment plan which includes medical screening labs and TTS consult. Pt agreed to plan.   Psychiatric holding orders in place.  TTS consult placed. The patient is medically cleared for further evaluation.  11:00 PM patient has been accepted at [facility] pending her UDS drug screen and alcohol level less than 200. Will plan to recheck alcohol level at 3 AM.  4:40 AM alcohol level now below 200 and patient medically cleared for further addiction treatment at [facility]. Dr. Sabra Heck is excepting physician.   Results for orders placed during the hospital encounter of 02/01/14  ACETAMINOPHEN LEVEL      Result Value Ref Range   Acetaminophen (Tylenol), Serum <15.0  10 - 30 ug/mL  CBC      Result Value Ref Range   WBC 5.6  4.0 - 10.5 K/uL   RBC 3.40 (*) 3.87 - 5.11 MIL/uL   Hemoglobin 10.4 (*) 12.0 - 15.0 g/dL   HCT 31.7 (*) 36.0 - 46.0 %   MCV 93.2  78.0 - 100.0 fL   MCH 30.6  26.0 - 34.0 pg   MCHC 32.8  30.0 - 36.0 g/dL   RDW 17.6 (*) 11.5 - 15.5 %   Platelets 56 (*) 150 - 400 K/uL  COMPREHENSIVE METABOLIC PANEL      Result Value Ref Range   Sodium 137  137 - 147 mEq/L   Potassium 4.0  3.7 - 5.3 mEq/L   Chloride 97  96 - 112 mEq/L   CO2 23  19 - 32 mEq/L   Glucose, Bld 103 (*) 70 - 99 mg/dL   BUN 5 (*) 6 - 23 mg/dL   Creatinine, Ser 0.40 (*) 0.50 - 1.10 mg/dL   Calcium 8.7  8.4 - 10.5 mg/dL   Total Protein 8.5 (*) 6.0 - 8.3 g/dL   Albumin 3.5  3.5 - 5.2 g/dL   AST 232 (*) 0 - 37 U/L   ALT 55 (*) 0 - 35 U/L   Alkaline Phosphatase 124 (*) 39 -  117 U/L   Total Bilirubin 1.6 (*) 0.3 - 1.2  mg/dL   GFR calc non Af Amer >90  >90 mL/min   GFR calc Af Amer >90  >90 mL/min  ETHANOL      Result Value Ref Range   Alcohol, Ethyl (B) 325 (*) 0 - 11 mg/dL  SALICYLATE LEVEL      Result Value Ref Range   Salicylate Lvl <4.9 (*) 2.8 - 20.0 mg/dL  URINE RAPID DRUG SCREEN (HOSP PERFORMED)      Result Value Ref Range   Opiates NONE DETECTED  NONE DETECTED   Cocaine POSITIVE (*) NONE DETECTED   Benzodiazepines NONE DETECTED  NONE DETECTED   Amphetamines NONE DETECTED  NONE DETECTED   Tetrahydrocannabinol NONE DETECTED  NONE DETECTED   Barbiturates NONE DETECTED  NONE DETECTED  URINALYSIS, ROUTINE W REFLEX MICROSCOPIC      Result Value Ref Range   Color, Urine YELLOW  YELLOW   APPearance CLEAR  CLEAR   Specific Gravity, Urine 1.010  1.005 - 1.030   pH 6.0  5.0 - 8.0   Glucose, UA NEGATIVE  NEGATIVE mg/dL   Hgb urine dipstick NEGATIVE  NEGATIVE   Bilirubin Urine NEGATIVE  NEGATIVE   Ketones, ur NEGATIVE  NEGATIVE mg/dL   Protein, ur NEGATIVE  NEGATIVE mg/dL   Urobilinogen, UA 1.0  0.0 - 1.0 mg/dL   Nitrite NEGATIVE  NEGATIVE   Leukocytes, UA MODERATE (*) NEGATIVE  ETHANOL      Result Value Ref Range   Alcohol, Ethyl (B) 148 (*) 0 - 11 mg/dL  URINE MICROSCOPIC-ADD ON      Result Value Ref Range   Squamous Epithelial / LPF FEW (*) RARE   WBC, UA 3-6  <3 WBC/hpf   RBC / HPF 0-2  <3 RBC/hpf   Bacteria, UA RARE  RARE    MDM   Final diagnoses:  Alcohol dependence  Alcohol intoxication  Thrombocytopenia   I personally performed the services described in this documentation, which was scribed in my presence. The recorded information has been reviewed and is accurate.      Martie Lee, PA-C 02/02/14 [phone]
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
## 2014-02-01 NOTE — BH Assessment (Signed)
Clinician consulted with Darlyne Russian, PA who recommends pt for inpatient detox pending medical clearance.  Pt BAL at 325 at 2043. Per Juanda Crumble pt will be available for transport after 0500 on 02/02/14. UDS also needed for medical clearance. Maudie Mercury, [facility] reported available bed at [facility].  Pt assigned to bed 301-1 to Dr. Sabra Heck.  Fredonia Highland, MSW, LCSW Triage Specialist [phone]

## 2014-02-01 NOTE — ED Notes (Signed)
Colletta Maryland, scales (sister)  [phone]
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
## 2014-02-02 ENCOUNTER — Inpatient Hospital Stay (HOSPITAL_COMMUNITY)
Admission: AD | Admit: 2014-02-02 | Discharge: 2014-02-05 | DRG: 897 | Disposition: A | Discharge status: Home / Self Care | Payer: BC Managed Care – PPO | Source: Intra-hospital | Attending: Psychiatry | Admitting: Psychiatry

## 2014-02-02 ENCOUNTER — Encounter (HOSPITAL_COMMUNITY): Payer: Self-pay

## 2014-02-02 DIAGNOSIS — F32A Depression, unspecified: Secondary | ICD-10-CM

## 2014-02-02 DIAGNOSIS — K7460 Unspecified cirrhosis of liver: Secondary | ICD-10-CM | POA: Diagnosis present

## 2014-02-02 DIAGNOSIS — K219 Gastro-esophageal reflux disease without esophagitis: Secondary | ICD-10-CM | POA: Diagnosis present

## 2014-02-02 DIAGNOSIS — Z87891 Personal history of nicotine dependence: Secondary | ICD-10-CM

## 2014-02-02 DIAGNOSIS — F329 Major depressive disorder, single episode, unspecified: Secondary | ICD-10-CM | POA: Diagnosis present

## 2014-02-02 DIAGNOSIS — F411 Generalized anxiety disorder: Secondary | ICD-10-CM | POA: Diagnosis present

## 2014-02-02 DIAGNOSIS — Z83719 Family history of colon polyps, unspecified: Secondary | ICD-10-CM

## 2014-02-02 DIAGNOSIS — F1020 Alcohol dependence, uncomplicated: Secondary | ICD-10-CM | POA: Diagnosis present

## 2014-02-02 DIAGNOSIS — F4310 Post-traumatic stress disorder, unspecified: Secondary | ICD-10-CM | POA: Diagnosis present

## 2014-02-02 DIAGNOSIS — F419 Anxiety disorder, unspecified: Secondary | ICD-10-CM | POA: Diagnosis present

## 2014-02-02 DIAGNOSIS — Z8371 Family history of colonic polyps: Secondary | ICD-10-CM

## 2014-02-02 DIAGNOSIS — Z8249 Family history of ischemic heart disease and other diseases of the circulatory system: Secondary | ICD-10-CM

## 2014-02-02 DIAGNOSIS — F1994 Other psychoactive substance use, unspecified with psychoactive substance-induced mood disorder: Secondary | ICD-10-CM | POA: Diagnosis present

## 2014-02-02 LAB — ETHANOL: Alcohol, Ethyl (B): 148 mg/dL — ABNORMAL HIGH (ref 0–11)

## 2014-02-02 MED ORDER — FOLIC ACID 1 MG PO TABS
1.0000 mg | ORAL_TABLET | Freq: Every day | ORAL | Status: DC
  Administered 2014-02-02 – 2014-02-05 (×4): 1 mg via ORAL
  Filled 2014-02-02 (×5): qty 1

## 2014-02-02 MED ORDER — ALUM & MAG HYDROXIDE-SIMETH 200-200-20 MG/5ML PO SUSP: 30.0000 mL | ORAL | Status: DC | PRN

## 2014-02-02 MED ORDER — ADULT MULTIVITAMIN W/MINERALS CH
1.0000 | ORAL_TABLET | Freq: Every day | ORAL | Status: DC
  Administered 2014-02-02 – 2014-02-05 (×4): 1 via ORAL
  Filled 2014-02-02 (×5): qty 1

## 2014-02-02 MED ORDER — FLUOXETINE HCL 20 MG PO CAPS
20.0000 mg | ORAL_CAPSULE | Freq: Every day | ORAL | Status: DC
  Administered 2014-02-02 – 2014-02-03 (×2): 20 mg via ORAL
  Filled 2014-02-02 (×4): qty 1

## 2014-02-02 MED ORDER — ADULT MULTIVITAMIN W/MINERALS CH
1.0000 | ORAL_TABLET | Freq: Every day | ORAL | Status: DC
  Filled 2014-02-02: qty 1

## 2014-02-02 MED ORDER — HYDROXYZINE HCL 50 MG PO TABS
50.0000 mg | ORAL_TABLET | Freq: Every evening | ORAL | Status: DC | PRN
  Administered 2014-02-02 – 2014-02-04 (×3): 50 mg via ORAL
  Filled 2014-02-02 (×3): qty 1

## 2014-02-02 MED ORDER — THIAMINE HCL 100 MG/ML IJ SOLN: 100.0000 mg | Freq: Once | INTRAMUSCULAR | Status: DC

## 2014-02-02 MED ORDER — IBUPROFEN 200 MG PO TABS
200.0000 mg | ORAL_TABLET | Freq: Four times a day (QID) | ORAL | Status: DC | PRN
  Administered 2014-02-03 (×2): 200 mg via ORAL
  Filled 2014-02-02 (×2): qty 1

## 2014-02-02 MED ORDER — HYDROXYZINE HCL 25 MG PO TABS
25.0000 mg | ORAL_TABLET | Freq: Four times a day (QID) | ORAL | Status: AC | PRN
  Administered 2014-02-02 (×2): 25 mg via ORAL
  Filled 2014-02-02: qty 1

## 2014-02-02 MED ORDER — ACETAMINOPHEN 325 MG PO TABS: 650.0000 mg | ORAL_TABLET | Freq: Four times a day (QID) | ORAL | Status: DC | PRN

## 2014-02-02 MED ORDER — LORAZEPAM 1 MG PO TABS
1.0000 mg | ORAL_TABLET | Freq: Four times a day (QID) | ORAL | Status: DC | PRN
  Administered 2014-02-03: 1 mg via ORAL
  Filled 2014-02-02: qty 1

## 2014-02-02 MED ORDER — PANTOPRAZOLE SODIUM 40 MG PO TBEC
40.0000 mg | DELAYED_RELEASE_TABLET | Freq: Every day | ORAL | Status: DC
  Administered 2014-02-02 – 2014-02-05 (×4): 40 mg via ORAL
  Filled 2014-02-02 (×6): qty 1

## 2014-02-02 MED ORDER — LOPERAMIDE HCL 2 MG PO CAPS: 2.0000 mg | ORAL_CAPSULE | ORAL | Status: AC | PRN

## 2014-02-02 MED ORDER — MAGNESIUM HYDROXIDE 400 MG/5ML PO SUSP: 30.0000 mL | Freq: Every day | ORAL | Status: DC | PRN

## 2014-02-02 MED ORDER — LORAZEPAM 1 MG PO TABS
2.0000 mg | ORAL_TABLET | Freq: Four times a day (QID) | ORAL | Status: DC | PRN
  Administered 2014-02-02 – 2014-02-03 (×3): 2 mg via ORAL
  Filled 2014-02-02 (×2): qty 2

## 2014-02-02 MED ORDER — LORAZEPAM 1 MG PO TABS
ORAL_TABLET | ORAL | Status: AC
  Filled 2014-02-02: qty 2

## 2014-02-02 MED ORDER — VITAMIN B-1 100 MG PO TABS
100.0000 mg | ORAL_TABLET | Freq: Every day | ORAL | Status: DC
  Administered 2014-02-03 – 2014-02-05 (×3): 100 mg via ORAL
  Filled 2014-02-02 (×4): qty 1

## 2014-02-02 MED ORDER — HYDROXYZINE HCL 25 MG PO TABS
ORAL_TABLET | ORAL | Status: AC
  Filled 2014-02-02: qty 1

## 2014-02-02 MED ORDER — ONDANSETRON 4 MG PO TBDP
8.0000 mg | ORAL_TABLET | Freq: Three times a day (TID) | ORAL | Status: DC | PRN
  Administered 2014-02-02 – 2014-02-04 (×3): 8 mg via ORAL
  Filled 2014-02-02 (×4): qty 2

## 2014-02-02 NOTE — ED Provider Notes (Signed)
Medical screening examination/treatment/procedure(s) were performed by non-physician practitioner and as supervising physician I was immediately available for consultation/collaboration.  Carmin Muskrat, MD 02/02/14 [phone]
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
## 2014-02-02 NOTE — Progress Notes (Signed)
[facility] Group Notes:  (Nursing/MHT/Case Management/Adjunct)  Date:  02/02/2014  Time:  6:18 PM  Type of Therapy:  Psychoeducational Skills  Participation Level:  Did Not Attend  Summary of Progress/Problems: Pt was in bed at the time of group.  Henya Aguallo C 02/02/2014, 6:18 PM 

## 2014-02-02 NOTE — BH Assessment (Signed)
Clinician consulted with Darlyne Russian, PA to review pt's UDS . He reported pt could be transported to [facility] after BAL is under 200. Lurlean Horns, RN to update.  Fredonia Highland, MSW, LCSW Triage Specialist [phone]
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
## 2014-02-02 NOTE — Progress Notes (Signed)
D: Pt denies SI/HI/AVH. Pt is pleasant and cooperative. Pt stated she feels better today than she felt yesterday. Pt spent a lot of time in the bed/room.  A: Pt was offered support and encouragement. Pt was given scheduled medications. Pt was encourage to attend groups. Q 15 minute checks were done for safety.   R:Pt attends groups and interacts well with peers and staff. Pt is taking medication. Pt has no complaints at this time.Pt receptive to treatment and safety maintained on unit.
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
## 2014-02-02 NOTE — BH Assessment (Signed)
D: Pt has been very flat and depressed on the unit today, she has been very isolative and has been in the bed all day. Pt reported that she was having severe withdrawal symptoms, prn medication given. Pt was also seen by Mickel Baas NP, new orders noted. Pt did not attend any groups and has not engaged in treatment. Pt reported being negative SI/HI, no AH/VH noted. A: 15 min checks continued for patient safety. R: Pt safety maintained.
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
## 2014-02-02 NOTE — Progress Notes (Signed)
Patient did attend the evening speaker AA meeting.  

## 2014-02-02 NOTE — BHH Group Notes (Signed)
[facility] Group Notes: (Clinical Social Work)   02/02/2014      Type of Therapy:  Group Therapy   Participation Level:  Did Not Attend    Selmer Dominion, LCSW 02/02/2014, 12:27 PM

## 2014-02-02 NOTE — BHH Group Notes (Signed)
Psychoeducational Group Note  Date:  02/02/2014 Time:  0900  Group Topic/Focus:  Orientation:   The focus of this group is to educate the patient on the purpose and policies of crisis stabilization and provide a format to answer questions about their admission.  The group details unit policies and expectations of patients while admitted.  Participation Level: Did Not Attend  Participation Quality:  Not Applicable  Affect:  Not Applicable  Cognitive:  Not Applicable  Insight:  Not Applicable  Engagement in Group: Not Applicable  Additional Comments:    Karel Jarvis 02/02/2014, 10:35 AM

## 2014-02-03 DIAGNOSIS — F411 Generalized anxiety disorder: Secondary | ICD-10-CM

## 2014-02-03 MED ORDER — GABAPENTIN 100 MG PO CAPS
100.0000 mg | ORAL_CAPSULE | Freq: Every day | ORAL | Status: DC
  Administered 2014-02-03 – 2014-02-04 (×2): 100 mg via ORAL
  Filled 2014-02-03 (×4): qty 1

## 2014-02-03 MED ORDER — GABAPENTIN 100 MG PO CAPS
100.0000 mg | ORAL_CAPSULE | Freq: Three times a day (TID) | ORAL | Status: DC
  Administered 2014-02-03 – 2014-02-05 (×7): 100 mg via ORAL
  Filled 2014-02-03 (×11): qty 1

## 2014-02-03 MED ORDER — ESCITALOPRAM OXALATE 10 MG PO TABS
10.0000 mg | ORAL_TABLET | Freq: Every day | ORAL | Status: DC
  Administered 2014-02-03 – 2014-02-05 (×3): 10 mg via ORAL
  Filled 2014-02-03 (×5): qty 1

## 2014-02-03 NOTE — BHH Group Notes (Signed)
[facility] LCSW Aftercare Discharge Planning Group Note   02/03/2014 10:29 AM  Participation Quality:  Appropriate   Mood/Affect:  Flat  Depression Rating:  0  Anxiety Rating:  4  Thoughts of Suicide:  No Will you contract for safety?   NA  Current AVH:  No  Plan for Discharge/Comments:  Pt reports that she plans to return home with her husband. She attended [facility] in November 2014 and stayed sober two months before relasping. Pt stated that she is set up with an addiction specialist and plans to continue with this service. CSW assessing. Pt reports that she does not have a psychiatrist and was not taking medications. Pt reports moderate withdrawals today.   Transportation Means: husband   Supports: husband/family supports identified.   [employer]
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
## 2014-02-03 NOTE — BHH Group Notes (Signed)
[place] LCSW Group Therapy  02/03/2014 2:55 PM  Type of Therapy:  Group Therapy  Participation Level:  Minimal  Participation Quality:  Attentive  Affect:  Depressed and Flat  Cognitive:  Oriented  Insight:  Limited  Engagement in Therapy:  Limited  Modes of Intervention:  Confrontation, Discussion, Education, Exploration, Problem-solving, Rapport Building, Socialization and Support  Summary of Progress/Problems:Today's Topic: Overcoming Obstacles. Pt identified obstacles faced currently and processed barriers involved in overcoming these obstacles. Pt identified steps necessary for overcoming these obstacles and explored motivation (internal and external) for facing these difficulties head on. Pt further identified one area of concern in their lives and chose a skill of focus pulled from their "toolbox." Janeisha shared that her biggest obstacle involves following through with plans and remaining sober. Tennyson actively listened during group but only participated when called upon by CSW. Angy stated that her family is now looking into a treatment center in [place], where pt is from originally. She shared that she is interested in this program as well. CSW encouraged pt to have family call CSW to work together on this plan. Rodnesha shows some progress in the group setting AEB her ability to actively listen and remain on topic/problem solve with CSW when called upon, but demonstrates some anxiety in the group setting.    Smart, [name] LCSWA  02/03/2014, 2:55 PM

## 2014-02-03 NOTE — Progress Notes (Signed)
Patient ID: Sara [name], female   DOB: [birth year], 39 y.o.   MRN: [id number] She has been up and to groups interacting a little with staff and peers tends to keep to herself. She did not fill out her self inventory today.  Has requested and received PRN if zofran and motrin today that has been effective.

## 2014-02-03 NOTE — Tx Team (Signed)
Interdisciplinary Treatment Plan Update (Adult)  Date: 02/03/2014   Time Reviewed: 10:32 AM  Progress in Treatment:  Attending groups: Yes  Participating in groups: Yes   Taking medication as prescribed: Yes  Tolerating medication: Yes  Family/Significant othe contact made: No, SPE not required for this pt.   Patient understands diagnosis: Yes, AEB seeking treatment for ETOH detox, cocaine abuse, mood stabilization/anxiety.  Discussing patient identified problems/goals with staff: Yes  Medical problems stabilized or resolved: Yes  Denies suicidal/homicidal ideation: Yes during admission/self report/group.  Patient has not harmed self or Others: Yes  New problem(s) identified:  Discharge Plan or Barriers: Pt plans to return home and follow up with her current addiction specialist. Pt reports that she does not take daily mental health meds and does not have psychiatrist. CSW assessing for appropriate referrals.  Additional comments: Sara [name] is a 38 year old female who presented voluntarily to [facility] requesting alcohol detox. She reports symptoms of depression, anxiety, and anxiety attacks. Sara [name] also has not been sleeping or eating well. Her UDS is positive for cocaine reporting that she recently started using illicit drugs. Patient states  during her psychiatric assessment "I was going to [place] groups. Doing pretty well. The groups brought up some of my past trauma. I guess I started drinking again to cope. I have been drinking two to three bottles of wine per day. I really want to stop. I have a substance abuse counselor to help me learn new coping skills.I know that my behavior negatively effects my family." She reports that her withdrawal symptoms improved after receiving a prn dose of ativan. [name] does not admit to recent cocaine use when asked about illicit drug use but drug screen tested positive for cocaine upon admission.  Reason for Continuation of Hospitalization: Medication  management Mood stabilization Withdrawals-not on detox protocol at this time.  Estimated length of stay: 2-3 days  For review of initial/current patient goals, please see plan of care.  Attendees:  Patient:    Family:    Physician: Carlton Adam MD 02/03/2014 10:31 AM   Nursing: Butch Penny RN 02/03/2014 10:31 AM   Clinical Social Worker [place], [place]  02/03/2014 10:31 AM   Other: Hardie Pulley. PA  02/03/2014 10:31 AM   Other:    Other: Gerline Legacy Nurse CM  02/03/2014 10:31 AM   Other:    Scribe for Treatment Team:  [place] LCSWA 02/03/2014 10:31 AM

## 2014-02-03 NOTE — Progress Notes (Signed)
Adult Psychoeducational Group Note  Date:  02/03/2014 Time:  10:00 am  Group Topic/Focus:  Wellness Toolbox:   The focus of this group is to discuss various aspects of wellness, balancing those aspects and exploring ways to increase the ability to experience wellness.  Patients will create a wellness toolbox for use upon discharge.  Participation Level:  Active  Participation Quality:  Appropriate, Sharing and Supportive  Affect:  Appropriate  Cognitive:  Appropriate  Insight: Appropriate  Engagement in Group:  Engaged  Modes of Intervention:  Discussion, Education, Socialization and Support  Additional Comments:  Pt stated that if she goes to [employer] and gets a sponsor that she can promote health and wellness in her personal life. Pt stated that isolation and seeing things out in the community that trigger her to use alcohol and drugs are the barrier to her progress.   Sadonna Kotara 02/03/2014, 10:38 AM

## 2014-02-03 NOTE — Progress Notes (Signed)
Adult Psychoeducational Group Note  Date:  02/03/2014 Time:  8:00PM Group Topic/Focus:  Wrap-Up Group:   The focus of this group is to help patients review their daily goal of treatment and discuss progress on daily workbooks.  Participation Level:  Active  Participation Quality:  Appropriate  Affect:  Appropriate  Cognitive:  Appropriate  Insight: Appropriate  Engagement in Group:  Engaged  Modes of Intervention:  Discussion  Additional Comments:  Pt attended AA  Theodoro Grist D 02/03/2014, 8:09 PM

## 2014-02-03 NOTE — Progress Notes (Signed)
NUTRITION ASSESSMENT  Pt identified as at risk on the Malnutrition Screen Tool  INTERVENTION: 1. Educated patient on the importance of nutrition and encouraged intake of food and beverages.  Discussed importance of regular meals, and high iron choices.  Provided handout on anemia and diet and sobriety nutrition. 2. Discussed weight goals. 3. Supplements: MVI, thiamine and folic acid daily  NUTRITION DIAGNOSIS: Unintentional weight loss related to sub-optimal intake as evidenced by pt report.   Goal: Pt to meet >/= 90% of their estimated nutrition needs.  Monitor:  PO intake  Assessment:  Patient admitted with etoh and cocaine abuse.  Major depression and anxiety disorder.  Patient reports a poor appetite for the past month secondary to anxiety.  States that she would just eat a sandwich when she got hungry.  Patient is concerned about her low iron.  Reports a recent 8 lb weight loss in 2 weeks secondary to a GI illness.  38 y.o. female  Height: Ht Readings from Last 1 Encounters:  02/02/14 5\' 3"  (1.6 m)    Weight: Wt Readings from Last 1 Encounters:  02/02/14 177 lb (80.287 kg)    Weight Hx: Wt Readings from Last 10 Encounters:  02/02/14 177 lb (80.287 kg)  01/15/14 185 lb 3 oz (84 kg)  09/27/13 180 lb (81.647 kg)  08/20/13 154 lb 9.6 oz (70.126 kg)  06/30/13 181 lb 14.4 oz (82.509 kg)  06/30/13 189 lb (85.73 kg)  05/31/13 188 lb (85.276 kg)  04/16/13 188 lb (85.276 kg)  12/01/12 188 lb (85.276 kg)  11/11/12 185 lb (83.915 kg)    BMI:  Body mass index is 31.36 kg/(m^2). Pt meets criteria for obesity grade 1 based on current BMI.  Estimated Nutritional Needs: Kcal: 25-30 kcal/kg Protein: > 1 gram protein/kg Fluid: 1 ml/kcal  Diet Order: General Pt is also offered choice of unit snacks mid-morning and mid-afternoon.  Pt is eating as desired.   Lab results and medications reviewed.   Antonieta Iba, RD, LDN Clinical Inpatient Dietitian Pager:  [phone] Weekend  and after hours pager:  [phone]

## 2014-02-03 NOTE — BHH Suicide Risk Assessment (Signed)
[facility] INPATIENT: Family/Significant Other Suicide Prevention Education  Suicide Prevention Education:  Education Completed; No one has been identified by the patient as the family member/significant other with whom the patient will be residing, and identified as the person(s) who will aid the patient in the event of a mental health crisis (suicidal ideations/suicide attempt).   Pt did not c/o SI at admission, nor have they endorsed SI during their stay here. SPE not required. SPI pamphlet provided to pt and she was encouraged to share information with support network, ask questions, and talk about any concerns relating to SPE.  [place], LCSWA 02/03/2014 1:01 PM

## 2014-02-04 NOTE — Progress Notes (Signed)
Patient ID: Sara [name], female   DOB: [birth year], 38 y.o.   MRN: [id number] She has been up a nd to more groups today and has been interacting with peers and staff more. He affect is brighter. Self inventory:depression 1, hopelessness 1,denies SI thoughts. She said that at night she would often hear her mothers voice and she would talk back to her. She said that she was feeling better today.

## 2014-02-04 NOTE — BHH Suicide Risk Assessment (Signed)
Suicide Risk Assessment  Discharge Assessment     Demographic Factors:  NA  Total Time spent with patient: 45 minutes  Psychiatric Specialty Exam:     Blood pressure 106/74, pulse 105, temperature 98.1 F (36.7 C), temperature source Oral, resp. rate 18, height 5\' 3"  (1.6 m), weight 80.287 kg (177 lb), last menstrual period 01/22/2014.Body mass index is 31.36 kg/(m^2).  General Appearance: Fairly Groomed  Engineer, water::  Fair  Speech:  Clear and Coherent  Volume:  Normal  Mood:  Euthymic  Affect:  Appropriate  Thought Process:  Coherent and Goal Directed  Orientation:  Full (Time, Place, and Person)  Thought Content:  relapse prevention plan  Suicidal Thoughts:  No  Homicidal Thoughts:  No  Memory:  Immediate;   Fair Recent;   Fair Remote;   Fair  Judgement:  Fair  Insight:  Present  Psychomotor Activity:  Normal  Concentration:  Fair  Recall:  [employer] of [place]: Fair  Akathisia:  No  Handed:    AIMS (if indicated):     Assets:  Desire for Improvement Housing Resilience Social Support  Sleep:  Number of Hours: 5.25    Musculoskeletal: Strength & Muscle Tone: within normal limits Gait & Station: normal Patient leans: N/A   Mental Status Per Nursing Assessment::   On Admission:  NA  Current Mental Status by Physician: In full contact with reality. There are no active S/S of withdrawal   Loss Factors: NA  Historical Factors: NA  Risk Reduction Factors:   Positive social support  Continued Clinical Symptoms:  Depression:   Comorbid alcohol abuse/dependence Alcohol/Substance Abuse/Dependencies  Cognitive Features That Contribute To Risk:  Closed-mindedness Polarized thinking Thought constriction (tunnel vision)    Suicide Risk:  Minimal: No identifiable suicidal ideation.  Patients presenting with no risk factors but with morbid ruminations; may be classified as minimal risk based on the severity of the depressive  symptoms  Discharge Diagnoses:   AXIS I:  Alcohol dependence, PTSD, Depressive Disorder NOS AXIS II:  Deferred AXIS III:   Past Medical History  Diagnosis Date  . Alcoholism   . Anxiety   . Depression   . Pneumonia   . GERD (gastroesophageal reflux disease)   . Cirrhosis   . Portal hypertension   . S/P alcohol detoxification     2-3 days at behavioral health previously   AXIS IV:  other psychosocial or environmental problems AXIS V:  61-70 mild symptoms  Plan Of Care/Follow-up recommendations:  Activity:  as tolerated Diet:  regular Follow up [facility] in [place] Is patient on multiple antipsychotic therapies at discharge:  No   Has Patient had three or more failed trials of antipsychotic monotherapy by history:  No  Recommended Plan for Multiple Antipsychotic Therapies: NA    Merced Hanners A 02/04/2014, 9:59 PM

## 2014-02-04 NOTE — Progress Notes (Signed)
[facility] MD Progress Note  02/04/2014 9:46 PM Sara [name]  MRN:  [id number] Subjective:  Sara [name] continues to get more stable. She is aware of the concerns with her liver. She is committed to abstinence. Her family is arranging for her to go to [place] to a treatment center. It is a 52 day program where she can address the alcohol as well as the PTSD. She is still having problems with sleep but does not think she will be able to sleep in this environment.  Diagnosis:   DSM5: Schizophrenia Disorders:  none Obsessive-Compulsive Disorders:  none Trauma-Stressor Disorders:  Posttraumatic Stress Disorder (309.81) Substance/Addictive Disorders:  Alcohol Related Disorder - Severe (303.90) Depressive Disorders:  Major Depressive Disorder - Moderate (296.22) Total Time spent with patient: 30 minutes  Axis I: Generalized Anxiety Disorder  ADL's:  Intact  Sleep: Poor  Appetite:  Fair  Suicidal Ideation:  Plan:  denies Intent:  denies Means:  denies Homicidal Ideation:  Plan:  denies Intent:  denies Means:  denies AEB (as evidenced by):  Psychiatric Specialty Exam: Physical Exam  Review of Systems  Constitutional: Negative.   HENT: Negative.   Eyes: Negative.   Respiratory: Negative.   Cardiovascular: Negative.   Gastrointestinal: Negative.   Genitourinary: Negative.   Musculoskeletal: Negative.   Skin: Negative.   Neurological: Negative.   Endo/Heme/Allergies: Negative.   Psychiatric/Behavioral: Positive for substance abuse. The patient is nervous/anxious and has insomnia.     Blood pressure 106/74, pulse 105, temperature 98.1 F (36.7 C), temperature source Oral, resp. rate 18, height 5\' 3"  (1.6 m), weight 80.287 kg (177 lb), last menstrual period 01/22/2014.Body mass index is 31.36 kg/(m^2).  General Appearance: Fairly Groomed  Engineer, water::  Fair  Speech:  Clear and Coherent  Volume:  Normal  Mood:  Anxious and worried  Affect:  anxious  Thought Process:  Coherent and  Goal Directed  Orientation:  Full (Time, Place, and Person)  Thought Content:  sympotms, worries, concerns  Suicidal Thoughts:  No  Homicidal Thoughts:  No  Memory:  Immediate;   Fair Recent;   Fair Remote;   Fair  Judgement:  Fair  Insight:  Present  Psychomotor Activity:  Normal  Concentration:  Fair  Recall:  [employer] of Knowledge:NA  Language: Fair  Akathisia:  No  Handed:    AIMS (if indicated):     Assets:  Desire for Improvement  Sleep:  Number of Hours: 5.25   Musculoskeletal: Strength & Muscle Tone: within normal limits Gait & Station: normal Patient leans: N/A  Current Medications: Current Facility-Administered Medications  Medication Dose Route Frequency Provider Last Rate Last Dose  . alum & mag hydroxide-simeth (MAALOX/MYLANTA) 200-200-20 MG/5ML suspension 30 mL  30 mL Oral Q4H PRN Dara Hoyer, PA-C      . escitalopram (LEXAPRO) tablet 10 mg  10 mg Oral Daily Nicholaus Bloom, MD   10 mg at 02/04/14 0827  . folic acid (FOLVITE) tablet 1 mg  1 mg Oral Daily Dara Hoyer, PA-C   1 mg at 02/04/14 0827  . gabapentin (NEURONTIN) capsule 100 mg  100 mg Oral TID Nicholaus Bloom, MD   100 mg at 02/04/14 1659  . gabapentin (NEURONTIN) capsule 100 mg  100 mg Oral QHS Nicholaus Bloom, MD   100 mg at 02/04/14 2113  . hydrOXYzine (ATARAX/VISTARIL) tablet 25 mg  25 mg Oral Q6H PRN Dara Hoyer, PA-C   25 mg at 02/02/14 1701  . hydrOXYzine (ATARAX/VISTARIL) tablet  50 mg  50 mg Oral QHS PRN,MR X 1 Dara Hoyer, PA-C   50 mg at 02/04/14 2114  . ibuprofen (ADVIL,MOTRIN) tablet 200 mg  200 mg Oral Q6H PRN Mojeed Akintayo   200 mg at 02/03/14 2326  . loperamide (IMODIUM) capsule 2-4 mg  2-4 mg Oral PRN Dara Hoyer, PA-C      . LORazepam (ATIVAN) tablet 1 mg  1 mg Oral Q6H PRN Dara Hoyer, PA-C   1 mg at 02/03/14 2135  . magnesium hydroxide (MILK OF MAGNESIA) suspension 30 mL  30 mL Oral Daily PRN Dara Hoyer, PA-C      . multivitamin with minerals tablet 1 tablet   1 tablet Oral Daily Dara Hoyer, PA-C   1 tablet at 02/04/14 0827  . ondansetron (ZOFRAN-ODT) disintegrating tablet 8 mg  8 mg Oral Q8H PRN Dara Hoyer, PA-C   8 mg at 02/04/14 2115  . pantoprazole (PROTONIX) EC tablet 40 mg  40 mg Oral Daily Dara Hoyer, PA-C   40 mg at 02/04/14 3154  . thiamine (B-1) injection 100 mg  100 mg Intramuscular Once Dara Hoyer, PA-C      . thiamine (VITAMIN B-1) tablet 100 mg  100 mg Oral Daily Dara Hoyer, PA-C   100 mg at 02/04/14 0086    Lab Results: No results found for this or any previous visit (from the past 41 hour(s)).  Physical Findings: AIMS:  , ,  ,  ,    CIWA:  CIWA-Ar Total: 0 COWS:     Treatment Plan Summary: Daily contact with patient to assess and evaluate symptoms and progress in treatment Medication management  Plan: Supportive approach/coping skills/relapse prevention           CBT;mindfulness           Continue medications  Medical Decision Making Problem Points:  Review of psycho-social stressors (1) Data Points:  Review of medication regiment & side effects (2)  I certify that inpatient services furnished can reasonably be expected to improve the patient's condition.   Sara [name] A 02/04/2014, 9:46 PM

## 2014-02-04 NOTE — Progress Notes (Signed)
D   Pt is depressed and sad   She isolates but is active in group  She complains of headache and trouble sleeping which she received medications for    A   Verbal support given   Medications administered and effectiveness monitored   Q 15 min checks R   Pt safe at present

## 2014-02-04 NOTE — [facility] Group Notes (Signed)
[facility] LCSW Group Therapy  02/04/2014 2:55 PM  Type of Therapy:  Group Therapy  Participation Level:  Minimal  Participation Quality:  Drowsy  Affect:  Depressed, Flat and Lethargic  Cognitive:  Lacking  Insight:  Limited  Engagement in Therapy:  Limited  Modes of Intervention:  Discussion, Education, Exploration, Problem-solving, Rapport Building, Socialization and Support  Summary of Progress/Problems: [facility] Speaker came to talk about his personal journey with substance abuse and addiction. The pt processed ways by which to relate to the speaker. [place] speaker provided handouts and educational information pertaining to groups and services offered by the [facility]. Machele was minimally engaged throughout today's therapy session. She appeared lethargic during group and had difficulty remaining attentive due to drowsiness. At this time, Kalley appears to be making minimal progress in the group setting due to drowsiness.     Smart, Devery Odwyer LCSWA  02/04/2014, 2:55 PM

## 2014-02-04 NOTE — [facility] Group Notes (Signed)
Adult Psychoeducational Group Note  Date:  02/04/2014 Time:  0900am  Group Topic/Focus:  Goals Group:   The focus of this group is to help patients establish daily goals to achieve during treatment and discuss how the patient can incorporate goal setting into their daily lives to aide in recovery. Orientation:   The focus of this group is to educate the patient on the purpose and policies of crisis stabilization and provide a format to answer questions about their admission.  The group details unit policies and expectations of patients while admitted.  Participation Level:  Minimal  Participation Quality:  Appropriate  Affect:  Flat  Cognitive:  Appropriate  Insight: Appropriate  Engagement in Group:  Engaged  Modes of Intervention:  Discussion, Education and Support  Additional Comments:  Pt sat quietly and attentively during group however did not share verbally during group other than to introduce herself.  Wynn Banker 02/04/2014, 9:30 AM

## 2014-02-04 NOTE — Progress Notes (Signed)
Patient resting quietly with eyes closed. Respirations even and unlabored. No distress noted . Q 15 minute check continues as ordered to maintain safety. 

## 2014-02-04 NOTE — Progress Notes (Signed)
Adult Psychoeducational Group Note  Date:  02/04/2014 Time:  10:00 am  Group Topic/Focus:  Recovery Goals:   The focus of this group is to identify appropriate goals for recovery and establish a plan to achieve them.  Participation Level:  Active  Participation Quality:  Appropriate, Sharing and Supportive  Affect:  Appropriate  Cognitive:  Appropriate  Insight: Appropriate  Engagement in Group:  Engaged  Modes of Intervention:  Discussion, Education, Socialization and Support  Additional Comments:  Pt stated that she wants to complete a 60 day inpatient program for her short term goal. Pt identified her long term goal is to work in a [employer].   Sara [name] 02/04/2014, 1:28 PM

## 2014-02-04 NOTE — Progress Notes (Signed)
Recreation Therapy Notes  Animal-Assisted Activity/Therapy (AAA/T) Program Checklist/Progress Notes Patient Eligibility Criteria Checklist & Daily Group note for Rec Tx Intervention  Date: 03.17.2015 Time: 2:45am Location: [address]   AAA/T Program Assumption of Risk Form signed by Patient/ or Parent Legal Guardian yes  Patient is free of allergies or sever asthma yes  Patient reports no fear of animals yes  Patient reports no history of cruelty to animals yes   Patient understands his/her participation is voluntary yes  Patient washes hands before animal contact yes  Patient washes hands after animal contact yes  Behavioral Response: Engaged, Appropriate   Education: Contractor, Appropriate Animal Interaction   Education Outcome: Acknowledges understanding   Clinical Observations/Feedback: Patient interacted appropriately with therapy dog team.   Lane Hacker, LRT/CTRS  Sho Salguero L 02/04/2014 4:16 PM

## 2014-02-05 DIAGNOSIS — F1994 Other psychoactive substance use, unspecified with psychoactive substance-induced mood disorder: Secondary | ICD-10-CM

## 2014-02-05 MED ORDER — ESCITALOPRAM OXALATE 10 MG PO TABS: 10.0000 mg | ORAL_TABLET | Freq: Every day | ORAL | Status: DC

## 2014-02-05 MED ORDER — FOLIC ACID 1 MG PO TABS: 1.0000 mg | ORAL_TABLET | Freq: Every day | ORAL | Status: DC

## 2014-02-05 MED ORDER — ADULT MULTIVITAMIN W/MINERALS CH: 1.0000 | ORAL_TABLET | Freq: Every day | ORAL | Status: DC

## 2014-02-05 MED ORDER — ONDANSETRON HCL 4 MG PO TABS: 4.0000 mg | ORAL_TABLET | Freq: Four times a day (QID) | ORAL | Status: DC | PRN

## 2014-02-05 MED ORDER — GABAPENTIN 100 MG PO CAPS: ORAL_CAPSULE | ORAL | Status: DC

## 2014-02-05 MED ORDER — PANTOPRAZOLE SODIUM 40 MG PO TBEC: 40.0000 mg | DELAYED_RELEASE_TABLET | Freq: Every day | ORAL | Status: DC

## 2014-02-05 NOTE — Discharge Summary (Signed)
Physician Discharge Summary Note  Patient:  Sara [name] is an 38 y.o., female MRN:  [id number] DOB:  09/23/1976 Patient phone:  [phone] (home)  Patient address:   [address] [place] [address],  Total Time spent with patient: Greater than 30 minutes  Date of Admission:  02/02/2014 Date of Discharge: 02/05/14  Reason for Admission:  Alcohol detox  Discharge Diagnoses: Principal Problem:   Alcohol dependence syndrome Active Problems:   Anxiety state, unspecified   Psychiatric Specialty Exam: Physical Exam  Constitutional: She is oriented to person, place, and time. She appears well-developed.  HENT:  Head: Normocephalic.  Eyes: Pupils are equal, round, and reactive to light.  Neck: Normal range of motion.  Cardiovascular: Normal rate.   Respiratory: Effort normal.  GI: Soft.  Genitourinary:  Denies any issues in this areas  Musculoskeletal: Normal range of motion.  Neurological: She is alert and oriented to person, place, and time.  Skin: Skin is warm and dry.  Psychiatric: Her speech is normal and behavior is normal. Judgment and thought content normal. Her mood appears not anxious. Her affect is not angry, not blunt, not labile and not inappropriate. Cognition and memory are normal. She does not exhibit a depressed mood.    Review of Systems  Constitutional: Negative.   HENT: Negative.   Eyes: Negative.   Respiratory: Negative.   Cardiovascular: Negative.   Gastrointestinal: Negative.   Genitourinary: Negative.   Musculoskeletal: Negative.   Skin: Negative.   Neurological: Negative.   Endo/Heme/Allergies: Negative.   Psychiatric/Behavioral: Positive for depression (Stabilized with medication prior to discharge) and substance abuse (Alcoholism, chronic). Negative for suicidal ideas, hallucinations and memory loss. The patient is not nervous/anxious and does not have insomnia.     Blood pressure 110/73, pulse 92, temperature 98.7 F (37.1 C),  temperature source Oral, resp. rate 18, height 5' 3" (1.6 m), weight 80.287 kg (177 lb), last menstrual period 01/22/2014.Body mass index is 31.36 kg/(m^2).  General Appearance: Casual and Fairly Groomed  Eye Contact::  Good  Speech:  Clear and Coherent  Volume:  Normal  Mood:  Stable  Affect:  Appropriate and Congruent  Thought Process:  Coherent and Goal Directed  Orientation:  Full (Time, Place, and Person)  Thought Content:  Denies any psychotic symptoms  Suicidal Thoughts:  No  Homicidal Thoughts:  No  Memory:  Immediate;   Good Recent;   Good Remote;   Good  Judgement:  Good  Insight:  Present  Psychomotor Activity:  Normal  Concentration:  Good  Recall:  Good  Fund of Knowledge:Fair  Language: Good  Akathisia:  No  Handed:  Right  AIMS (if indicated):     Assets:  Desire for Improvement  Sleep:  Number of Hours: 6    Past Psychiatric History: Diagnosis: Alcohol Related Disorder - Severe (303.90), Substance induced mood disorder  Hospitalizations: [facility] Adult unit  Outpatient Care: [facility]  Substance Abuse Care: [facility]  Self-Mutilation: NA  Suicidal Attempts: NA  Violent Behaviors: NA   Musculoskeletal: Strength & Muscle Tone: within normal limits Gait & Station: normal Patient leans: N/A  DSM5:  Schizophrenia Disorders:  NA Obsessive-Compulsive Disorders:  NA Trauma-Stressor Disorders: PTSD Substance/Addictive Disorders:  Alcohol Related Disorder - Severe (303.90) Depressive Disorders: Substance induced mood disorder  Axis Diagnosis:   AXIS I:  Alcohol Related Disorder - Severe (303.90), Substance induced mood disorder AXIS II:  Deferred AXIS III:   Past Medical History  Diagnosis Date  .  Alcoholism   . Anxiety   . Depression   . Pneumonia   . GERD (gastroesophageal reflux disease)   . Cirrhosis   . Portal hypertension   . S/P alcohol detoxification     2-3 days at behavioral health previously   AXIS IV:   Alcoholism, chronic AXIS V:  52  Level of Care:  RTC  Hospital Course:  Sara [name] is a 37 year old female who presented voluntarily to [facility] requesting alcohol detox. She reports symptoms of depression, anxiety, and anxiety attacks. Sara [name] also has not been sleeping or eating well. Her UDS is positive for cocaine reporting that she recently started using illicit drugs. Patient states today during her psychiatric assessment "I was going to AA groups. Doing pretty well. The groups brought up some of my past trauma. I guess I started drinking again to cope. I have been drinking two to three bottles of wine per day. I really want to stop.  Sara [name] was admitted the hospital with blood alcohol level of 325 per toxicology reports and UDS positive for cocaine. She was intoxicated needing detox treatment. She also has elevated liver functions. As a result, unable to detox using Librium detoxification treatment protocols. She was then started on lorazepam (Ativan) 1 mg treatment protocol on a tapering dose for her alcohol detoxification. This was chosen in place of Librium treatment protocol because her liver has already be compromised from alcoholism.  Sara [name] is receiving a much cleaner detoxification treatment without endangering her liver functions any further.  Besides the detoxification treatment protocol, Sara [name] also received medication management for her mood symptoms and alcoholism. She was ordered and received Lexapro 10 mg daily for depression and Gabapentin 100 mg three times daily for alcohol related withdrawal syndrome. She was also enrolled in group counseling sessions and activities to learn coping skills that should help her after discharge to cope better and manage her substance abuse issues for a much sustainable sobriety.   Sara [name] was also enrolled/attended AA/NA meetings being offered and held on this unit. She tolerated her detoxification treatment regimen without any significant adverse effects  and or reactions reported. Patient attended treatment team meeting this am and met with the treatment team members. Her reason for admission, substance abuse issues, response to treatment and discharge plans discussed. Patient endorsed that she is doing well and stable for discharge to pursue the next phase of her substance abuse treatment. She will continue treatment at the [facility] in [place].   Upon discharge, patient adamantly denies suicidal, homicidal ideations, auditory, visual hallucinations, delusional thinking and or withdrawal symptoms. Patient left [facility] with all personal belongings in no apparent distress. She received 2 weeks worth supply samples of her discharge medications. Transportation to [place] per airplane. Her sister will meet her at the airport.  Consults:  psychiatry  Significant Diagnostic Studies:  labs: CBC with diff, CMP, UDS, Toxicology tests, U/A  Discharge Vitals:   Blood pressure 110/73, pulse 92, temperature 98.7 F (37.1 C), temperature source Oral, resp. rate 18, height 5' 3" (1.6 m), weight 80.287 kg (177 lb), last menstrual period 01/22/2014. Body mass index is 31.36 kg/(m^2). Lab Results:   No results found for this or any previous visit (from the past 72 hour(s)).  Physical Findings: AIMS:  , ,  ,  ,    CIWA:  CIWA-Ar Total: 0 COWS:     Psychiatric Specialty Exam: See Psychiatric Specialty Exam and Suicide Risk Assessment completed by Attending Physician prior to discharge.    Discharge destination:  RTC  Is patient on multiple antipsychotic therapies at discharge:  No   Has Patient had three or more failed trials of antipsychotic monotherapy by history:  No  Recommended Plan for Multiple Antipsychotic Therapies: NA     Medication List    STOP taking these medications       oxyCODONE 5 MG immediate release tablet  Commonly known as:  Oxy IR/ROXICODONE      TAKE these medications     Indication   escitalopram 10  MG tablet  Commonly known as:  LEXAPRO  Take 1 tablet (10 mg total) by mouth daily. For depression   Indication:  Depression     folic acid 1 MG tablet  Commonly known as:  FOLVITE  Take 1 tablet (1 mg total) by mouth daily. For low folate   Indication:  Deficiency of Folic Acid in the Diet     gabapentin 100 MG capsule  Commonly known as:  NEURONTIN  Take 1 capsule (100 mg) three times daily and at Bedtime: For substance withdrawal syndrome   Indication:  Alcohol Withdrawal Syndrome, Pain     multivitamin with minerals Tabs tablet  Take 1 tablet by mouth daily. For low vitamin   Indication:  Vitamin supplement     ondansetron 4 MG tablet  Commonly known as:  ZOFRAN  Take 1 tablet (4 mg total) by mouth every 6 (six) hours as needed for nausea.   Indication:  For Nausea     pantoprazole 40 MG tablet  Commonly known as:  PROTONIX  Take 1 tablet (40 mg total) by mouth daily. For acid reflux   Indication:  Gastroesophageal Reflux Disease       Follow-up Information   Follow up with [facility]  On 02/05/2014. (Arrive this evening for admission. )    Contact information:   [address] [place] [address] Phone: [phone] Fax: [phone]     Follow-up recommendations:  Activity:  As tolerated Diet: As recommended by your primary care doctor. Keep all scheduled follow-up appointments as recommended.  Comments: Take all your medications as prescribed by your mental healthcare provider. Report any adverse effects and or reactions from your medicines to your outpatient provider promptly. Patient is instructed and cautioned to not engage in alcohol and or illegal drug use while on prescription medicines. In the event of worsening symptoms, patient is instructed to call the crisis hotline, 911 and or go to the nearest ED for appropriate evaluation and treatment of symptoms. Follow-up with your primary care provider for your other medical issues, concerns and or  health care needs.    Total Discharge Time:  Greater than 30 minutes.  Signed: Nwoko, Agnes I, PMHNP-BC 02/05/2014, 11:39 AM  Patient was seen face to face for psych evaluation including suicide risk assessment, case discussed with physician extender and made disposition plan. Reviewed the information documented and agree with the treatment plan.  ,JANARDHAHA R. 02/07/2014 6:27 PM 

## 2014-02-05 NOTE — Progress Notes (Signed)
[facility] Adult Case Management Discharge Plan :  Will you be returning to the same living situation after discharge: No.Flight to [place].  At discharge, do you have transportation home?:Yes,  sister taking pt to airport. Treatment center picking up pt when she arrives in [place] this evening.  Do you have the ability to pay for your medications:Yes,  BCBS insurance  Release of information consent forms completed and submitted to Medical Records by CSW. Patient to Follow up at: Follow-up Information   Follow up with [facility]  On 02/05/2014. (Arrive this evening for admission. )    Contact information:   [place] [address] Phone: [phone] Fax: [phone]      Patient denies SI/HI:   Yes,  during admission, group, and self report.     Safety Planning and Suicide Prevention discussed:  Yes,  SPE not required for this pt. SPI pamphlet provided to pt and she was encouraged to share information with support network, ask questions, and talk about any concerns relating to SPE.  Smart, Dali Kraner LCSWA  02/05/2014, 10:36 AM
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
## 2014-02-05 NOTE — Progress Notes (Signed)
Patient ID: Sara [name], female   DOB: 1976/01/10, 38 y.o.   MRN: [id number] She has been up and about and to groups interacting with peers and staff. She spoke of leaving today and going to [place] for long term treatment.  Self inventory: depressed and hopeless at 1, denies SI  Thoughts. Denies withdrawal  systomes  And discomfort.

## 2014-02-05 NOTE — Progress Notes (Signed)
Patient ID: Sara [name], female   DOB: [birth year], 39 y.o.   MRN: [id number] She has been discharged home and was picked up be her daughter. She voiced understanding of discharge instruction and of the follow up plan.  She denies thoughts of SI and all her belonging taken home with her. Stated she was going home to pack and go to treatment in [place].

## 2014-02-05 NOTE — BHH Group Notes (Signed)
[facility] LCSW Aftercare Discharge Planning Group Note   02/05/2014 10:32 AM  Participation Quality:  Appropriate   Mood/Affect:  Appropriate  Depression Rating:  0  Anxiety Rating:  5  Thoughts of Suicide:  No Will you contract for safety?   NA  Current AVH:  No  Plan for Discharge/Comments:  Pt reports that she was accepted into facility in [place]. Pt has flight booked for 6pm today and will be admitted this evening. Pt family to transport pt to airport.   Transportation Means: family   Supports: Estate agent, Research officer, trade union

## 2014-02-05 NOTE — Tx Team (Signed)
Interdisciplinary Treatment Plan Update (Adult)  Date: 02/05/2014   Time Reviewed: 10:38 AM  Progress in Treatment:  Attending groups: Yes  Participating in groups: Yes   Taking medication as prescribed: Yes  Tolerating medication: Yes  Family/Significant othe contact made: No, SPE not required for this pt.   Patient understands diagnosis: Yes, AEB seeking treatment for ETOH detox, cocaine abuse, mood stabilization/anxiety.  Discussing patient identified problems/goals with staff: Yes  Medical problems stabilized or resolved: Yes  Denies suicidal/homicidal ideation: Yes during admission/self report/group.  Patient has not harmed self or Others: Yes  New problem(s) identified:  Discharge Plan or Barriers: Pt accepted into [facility] in [place]. Flight leaves at 6pm this evening. 3 month program.  Additional comments: n/a Reason for Continuation of Hospitalization: d/c today Estimated length of stay: d/c today For review of initial/current patient goals, please see plan of care.  Attendees:  Patient:    Family:    Physician: none  02/05/2014 10:38 AM   Nursing: Butch Penny RN 02/05/2014 10:38 AM   Clinical Social Worker [place], [place]  02/05/2014 10:38 AM   Other: Hardie Pulley. PA  02/05/2014 10:38 AM   Other: Adonis Huguenin RN 02/05/2014 10:39 AM   Other: Gerline Legacy Nurse CM  02/05/2014 10:38 AM   Other:    Scribe for Treatment Team:  [place] LCSWA 02/05/2014 10:38 AM

## 2014-02-06 NOTE — Consult Note (Signed)
Agree with plan 

## 2014-02-07 NOTE — Progress Notes (Addendum)
Patient Discharge Instructions:  After Visit Summary (AVS):   Faxed to:  02/11/14 Discharge Summary Note:   Faxed to:  02/11/14 Psychiatric Admission Assessment Note:   Faxed to:  02/11/14 Suicide Risk Assessment - Discharge Assessment:   Faxed to:  02/11/14 Faxed/Sent to the Next Level Care provider:  02/11/14 Faxed to [place] @ [phone] No documentation was faxed on 02/07/14 to [facility] treatment for HBIPs.  Unable to confirm fax number or address.  Patsey Berthold, 02/07/2014, 4:23 PM

## 2014-04-18 ENCOUNTER — Encounter: Payer: BC Managed Care – PPO | Admitting: Family Medicine

## 2014-04-21 ENCOUNTER — Encounter (HOSPITAL_COMMUNITY): Payer: Self-pay | Admitting: Emergency Medicine

## 2014-04-21 ENCOUNTER — Emergency Department (HOSPITAL_COMMUNITY)
Admission: EM | Admit: 2014-04-21 | Discharge: 2014-04-22 | Disposition: A | Discharge status: Home / Self Care | Payer: PRIVATE HEALTH INSURANCE | Attending: Emergency Medicine | Admitting: Emergency Medicine

## 2014-04-21 DIAGNOSIS — Z79899 Other long term (current) drug therapy: Secondary | ICD-10-CM | POA: Insufficient documentation

## 2014-04-21 DIAGNOSIS — F10929 Alcohol use, unspecified with intoxication, unspecified: Secondary | ICD-10-CM

## 2014-04-21 DIAGNOSIS — K219 Gastro-esophageal reflux disease without esophagitis: Secondary | ICD-10-CM | POA: Insufficient documentation

## 2014-04-21 DIAGNOSIS — F411 Generalized anxiety disorder: Secondary | ICD-10-CM | POA: Insufficient documentation

## 2014-04-21 DIAGNOSIS — F1010 Alcohol abuse, uncomplicated: Secondary | ICD-10-CM | POA: Insufficient documentation

## 2014-04-21 NOTE — ED Notes (Signed)
Bed: WA17 Expected date:  Expected time:  Means of arrival:  Comments: EMS/Combative

## 2014-04-21 NOTE — ED Notes (Signed)
Per EMS, Patient family came home and found patient to be intoxicated, non cooperative and non compliant. Patient has hx of bipolar and alcoholism. Patient conscious and alert, combative, flailing feet, attempting to get off stretcher. Patient arrives with EMS and [employer]. Patient handcuffed with legs restrained on arrival. Patient refuses to answer questions related to orientation, states "all I want to do is sleep" repeatedly. Patient reports drinking less than one bottle of wine tonight, typical intake 2-3 bottles of wine per night. Patient is able to follow commands and answer questions r/t med hx.

## 2014-04-22 ENCOUNTER — Encounter (HOSPITAL_COMMUNITY): Payer: Self-pay

## 2014-04-22 LAB — COMPREHENSIVE METABOLIC PANEL
ALT: 22 U/L (ref 0–35)
AST: 53 U/L — ABNORMAL HIGH (ref 0–37)
Albumin: 3.5 g/dL (ref 3.5–5.2)
Alkaline Phosphatase: 109 U/L (ref 39–117)
BUN: 7 mg/dL (ref 6–23)
CALCIUM: 8.5 mg/dL (ref 8.4–10.5)
CO2: 23 meq/L (ref 19–32)
Chloride: 102 mEq/L (ref 96–112)
Creatinine, Ser: 0.34 mg/dL — ABNORMAL LOW (ref 0.50–1.10)
Glucose, Bld: 103 mg/dL — ABNORMAL HIGH (ref 70–99)
Potassium: 3.5 mEq/L — ABNORMAL LOW (ref 3.7–5.3)
Sodium: 140 mEq/L (ref 137–147)
Total Bilirubin: 1 mg/dL (ref 0.3–1.2)
Total Protein: 7.9 g/dL (ref 6.0–8.3)

## 2014-04-22 LAB — CBC
HEMATOCRIT: 32.8 % — AB (ref 36.0–46.0)
Hemoglobin: 11.2 g/dL — ABNORMAL LOW (ref 12.0–15.0)
MCH: 30.9 pg (ref 26.0–34.0)
MCHC: 34.1 g/dL (ref 30.0–36.0)
MCV: 90.6 fL (ref 78.0–100.0)
Platelets: 72 10*3/uL — ABNORMAL LOW (ref 150–400)
RBC: 3.62 MIL/uL — ABNORMAL LOW (ref 3.87–5.11)
RDW: 19.6 % — AB (ref 11.5–15.5)
WBC: 6.8 10*3/uL (ref 4.0–10.5)

## 2014-04-22 LAB — ETHANOL: ALCOHOL ETHYL (B): 289 mg/dL — AB (ref 0–11)

## 2014-04-22 NOTE — ED Provider Notes (Signed)
CSN: [id number]     Arrival date & time 04/21/14  2348 History   First MD Initiated Contact with Patient 04/22/14 0020     Chief Complaint  Patient presents with  . Alcohol Intoxication      HPI Patient presents emergency department after being found intoxicated and a non-cooperative by family.  Patient has a history of bipolar disorder and alcoholism.  Patient was agitated on arrival of emergency department and required restraints on arrival.  By the time I saw and evaluated the patient she is doing much better.  She agreed to drinking alcohol tonight.  She has no suicidal or homicidal thoughts.  She states she was just drinking to feel the effects of alcohol.  No other complaints   Past Medical History  Diagnosis Date  . Alcoholism   . Anxiety   . GERD (gastroesophageal reflux disease)    Past Surgical History  Procedure Laterality Date  . Cholecystectomy     No family history on file. History  Substance Use Topics  . Smoking status: Never Smoker   . Smokeless tobacco: Not on file  . Alcohol Use: Yes     Comment: 2-3 bottles of wine per night   OB History   Grav Para Term Preterm Abortions TAB SAB Ect Mult Living                 Review of Systems  All other systems reviewed and are negative.     Allergies  Morphine and related  Home Medications   Prior to Admission medications   Medication Sig Start Date End Date Taking? Authorizing Provider  escitalopram (LEXAPRO) 10 MG tablet Take 10 mg by mouth daily.   Yes Historical Provider, MD  folic acid (FOLVITE) 1 MG tablet Take 1 mg by mouth daily.   Yes Historical Provider, MD  Multiple Vitamin (MULTIVITAMIN WITH MINERALS) TABS tablet Take 1 tablet by mouth daily.   Yes Historical Provider, MD  pantoprazole (PROTONIX) 40 MG tablet Take 40 mg by mouth daily.   Yes Historical Provider, MD  QUEtiapine (SEROQUEL) 50 MG tablet Take 50-150 mg by mouth at bedtime.   Yes Historical Provider, MD   BP 98/58  Pulse 97   Temp(Src) 97.9 F (36.6 C) (Oral)  Resp 18  SpO2 96%  LMP 11/22/2013 Physical Exam  Nursing note and vitals reviewed. Constitutional: She is oriented to person, place, and time. She appears well-developed and well-nourished. No distress.  HENT:  Head: Normocephalic and atraumatic.  Eyes: EOM are normal.  Neck: Normal range of motion.  Cardiovascular: Normal rate, regular rhythm and normal heart sounds.   Pulmonary/Chest: Effort normal and breath sounds normal.  Abdominal: Soft. She exhibits no distension. There is no tenderness.  Musculoskeletal: Normal range of motion.  Neurological: She is alert and oriented to person, place, and time.  Skin: Skin is warm and dry.  Psychiatric: She has a normal mood and affect. Judgment normal.    ED Course  Procedures (including critical care time) Labs Review Labs Reviewed  CBC - Abnormal; Notable for the following:    RBC 3.62 (*)    Hemoglobin 11.2 (*)    HCT 32.8 (*)    RDW 19.6 (*)    Platelets 72 (*)    All other components within normal limits  ETHANOL - Abnormal; Notable for the following:    Alcohol, Ethyl (B) 289 (*)    All other components within normal limits  COMPREHENSIVE METABOLIC PANEL - Abnormal; Notable for  the following:    Potassium 3.5 (*)    Glucose, Bld 103 (*)    Creatinine, Ser 0.34 (*)    AST 53 (*)    All other components within normal limits    Imaging Review No results found.   EKG Interpretation None      MDM   Final diagnoses:  Alcohol intoxication    Patient allowed to sober in the emergency department.  She is doing much better.  She's been ambulatory in ER.  Discharge home in good condition.  Alcohol intoxication.    Hoy Morn, MD 04/22/14 [phone]

## 2014-04-22 NOTE — ED Notes (Signed)
Patient states tonight she was drinking wine "not much", reports she routinely drinks 2-3 bottles of wine per night. Patient states tonight she was feeling sad. Patient states tonight she just wanted to sleep, family was concerned and called 911. Patient repeatedly denies SI. States she is sad about life, and the way things have turned out.

## 2014-04-22 NOTE — ED Notes (Signed)
Patient states she wants to leave. Patient was advised she will need a sober ride home before being discharged. Patient gave permission to call her husband "Sara [name]" at the phone number listed on her emergency contact sheet. Called [phone] as listed in chart, a female answered the phone and states that it is a wrong number.

## 2014-04-22 NOTE — ED Notes (Signed)
Patient with scattered bruises to bilateral arms at discharge.

## 2014-04-22 NOTE — Discharge Instructions (Signed)
Alcohol Intoxication °Alcohol intoxication occurs when the amount of alcohol that a person has consumed impairs his or her ability to mentally and physically function. Alcohol directly impairs the normal chemical activity of the brain. Drinking large amounts of alcohol can lead to changes in mental function and behavior, and it can cause many physical effects that can be harmful.  °Alcohol intoxication can range in severity from mild to very severe. Various factors can affect the level of intoxication that occurs, such as the person's age, gender, weight, frequency of alcohol consumption, and the presence of other medical conditions (such as diabetes, seizures, or heart conditions). Dangerous levels of alcohol intoxication may occur when people drink large amounts of alcohol in a short period (binge drinking). Alcohol can also be especially dangerous when combined with certain prescription medicines or "recreational" drugs. °SIGNS AND SYMPTOMS °Some common signs and symptoms of mild alcohol intoxication include: °· Loss of coordination. °· Changes in mood and behavior. °· Impaired judgment. °· Slurred speech. °As alcohol intoxication progresses to more severe levels, other signs and symptoms will appear. These may include: °· Vomiting. °· Confusion and impaired memory. °· Slowed breathing. °· Seizures. °· Loss of consciousness. °DIAGNOSIS  °Your health care provider will take a medical history and perform a physical exam. You will be asked about the amount and type of alcohol you have consumed. Blood tests will be done to measure the concentration of alcohol in your blood. In many places, your blood alcohol level must be lower than 80 mg/dL (0.08%) to legally drive. However, many dangerous effects of alcohol can occur at much lower levels.  °TREATMENT  °People with alcohol intoxication often do not require treatment. Most of the effects of alcohol intoxication are temporary, and they go away as the alcohol naturally  leaves the body. Your health care provider will monitor your condition until you are stable enough to go home. Fluids are sometimes given through an IV access tube to help prevent dehydration.  °HOME CARE INSTRUCTIONS °· Do not drive after drinking alcohol. °· Stay hydrated. Drink enough water and fluids to keep your urine clear or pale yellow. Avoid caffeine.   °· Only take over-the-counter or prescription medicines as directed by your health care provider.   °SEEK MEDICAL CARE IF:  °· You have persistent vomiting.   °· You do not feel better after a few days. °· You have frequent alcohol intoxication. Your health care provider can help determine if you should see a substance use treatment counselor. °SEEK IMMEDIATE MEDICAL CARE IF:  °· You become shaky or tremble when you try to stop drinking.   °· You shake uncontrollably (seizure).   °· You throw up (vomit) blood. This may be bright red or may look like black coffee grounds.   °· You have blood in your stool. This may be bright red or may appear as a black, tarry, bad smelling stool.   °· You become lightheaded or faint.   °MAKE SURE YOU:  °· Understand these instructions. °· Will watch your condition. °· Will get help right away if you are not doing well or get worse. °Document Released: 08/17/2005 Document Revised: 07/10/2013 Document Reviewed: 04/12/2013 °[employer]® Patient Information ©2014 [employer]. ° °

## 2014-04-22 NOTE — ED Notes (Signed)
Patient resting quietly, laying on her side, eyes closed. Denies needs at this time.

## 2014-04-22 NOTE — ED Notes (Signed)
EMT Santiago Glad and I attempted to help Pt use a bed pan after she said she had to use the bathroom. She refused the bed pan and said she would like to speak to the doctor because she wants to leave AMA. EMT's told Pt we would notify the nurse. RN Amie and RN Terri aware of Pt's requests.

## 2014-04-22 NOTE — ED Notes (Signed)
Spoke to patient husband [name]) at [phone], states he is unable to pick up the patient. He suggests the patient's parents may be able to pick her up, their number is [phone]. Will request permission from patient prior to calling.

## 2014-04-22 NOTE — ED Notes (Signed)
Patient declines to have her parents called at this time. Patient agrees to remain until she is clinically sober. Patient ambulated to restroom independently. Patient given ice water. Patient agrees to blood draw at this time.

## 2014-05-10 ENCOUNTER — Encounter (HOSPITAL_COMMUNITY): Payer: Self-pay | Admitting: Emergency Medicine

## 2014-05-10 ENCOUNTER — Emergency Department (HOSPITAL_COMMUNITY): Payer: 59

## 2014-05-10 ENCOUNTER — Emergency Department (HOSPITAL_COMMUNITY)
Admission: EM | Admit: 2014-05-10 | Discharge: 2014-05-10 | Disposition: A | Discharge status: Home / Self Care | Payer: 59 | Attending: Emergency Medicine | Admitting: Emergency Medicine

## 2014-05-10 DIAGNOSIS — W108XXA Fall (on) (from) other stairs and steps, initial encounter: Secondary | ICD-10-CM | POA: Diagnosis not present

## 2014-05-10 DIAGNOSIS — S59919A Unspecified injury of unspecified forearm, initial encounter: Secondary | ICD-10-CM | POA: Diagnosis present

## 2014-05-10 DIAGNOSIS — S63501A Unspecified sprain of right wrist, initial encounter: Secondary | ICD-10-CM

## 2014-05-10 DIAGNOSIS — Y939 Activity, unspecified: Secondary | ICD-10-CM | POA: Insufficient documentation

## 2014-05-10 DIAGNOSIS — Z8701 Personal history of pneumonia (recurrent): Secondary | ICD-10-CM | POA: Insufficient documentation

## 2014-05-10 DIAGNOSIS — Y929 Unspecified place or not applicable: Secondary | ICD-10-CM | POA: Insufficient documentation

## 2014-05-10 DIAGNOSIS — S63509A Unspecified sprain of unspecified wrist, initial encounter: Secondary | ICD-10-CM | POA: Diagnosis not present

## 2014-05-10 DIAGNOSIS — Z791 Long term (current) use of non-steroidal anti-inflammatories (NSAID): Secondary | ICD-10-CM | POA: Diagnosis not present

## 2014-05-10 DIAGNOSIS — S60219A Contusion of unspecified wrist, initial encounter: Secondary | ICD-10-CM | POA: Insufficient documentation

## 2014-05-10 DIAGNOSIS — Z8719 Personal history of other diseases of the digestive system: Secondary | ICD-10-CM | POA: Insufficient documentation

## 2014-05-10 DIAGNOSIS — Z8659 Personal history of other mental and behavioral disorders: Secondary | ICD-10-CM | POA: Insufficient documentation

## 2014-05-10 DIAGNOSIS — S6990XA Unspecified injury of unspecified wrist, hand and finger(s), initial encounter: Secondary | ICD-10-CM | POA: Diagnosis present

## 2014-05-10 DIAGNOSIS — S59909A Unspecified injury of unspecified elbow, initial encounter: Secondary | ICD-10-CM | POA: Diagnosis present

## 2014-05-10 MED ORDER — IBUPROFEN 800 MG PO TABS: 800.0000 mg | ORAL_TABLET | Freq: Three times a day (TID) | ORAL | Status: DC

## 2014-05-10 MED ORDER — IBUPROFEN 800 MG PO TABS
800.0000 mg | ORAL_TABLET | Freq: Once | ORAL | Status: AC
  Administered 2014-05-10: 800 mg via ORAL
  Filled 2014-05-10: qty 1

## 2014-05-10 NOTE — ED Provider Notes (Signed)
CSN: [id number]     Arrival date & time 05/10/14  0008 History   First MD Initiated Contact with Patient 05/10/14 0053     Chief Complaint  Patient presents with  . Wrist Pain     (Consider location/radiation/quality/duration/timing/severity/associated sxs/prior Treatment) Patient is a 38 y.o. female presenting with wrist pain. The history is provided by the patient and medical records. No language interpreter was used.  Wrist Pain Associated symptoms include arthralgias and joint swelling. Pertinent negatives include no chills, fever, nausea, neck pain, numbness or vomiting.    Sara [name] is a 38 y.o. female  with a hx of depression, cirrhosis, EtOH abuse presents to the Emergency Department c/o acute, persistent right wrist pain onset 7pm after reportedly falling down the stairs today.  She reports she was able to catch herself but her right wrist has been hurting since that time.  Associated symptoms include swelling to the right wrist.  Pt reports no treatments PTA.  .  No alleviating factors, movement and palpation makes it worse.  Pt denies fever, chills, neck pain, CP, SOB, back pain, numbness, tingling.  Pt reports no EtOH today.      Past Medical History  Diagnosis Date  . Depression   . Pneumonia   . Cirrhosis   . Portal hypertension   . S/P alcohol detoxification     2-3 days at behavioral health previously  . Alcoholism   . Anxiety   . GERD (gastroesophageal reflux disease)    Past Surgical History  Procedure Laterality Date  . Cholecystectomy     Family History  Problem Relation Age of Onset  . Colon polyps Mother   . Hypertension Mother   . Thyroid disease Mother   . Alcoholism Mother   . Alcoholism Father   . Alcohol abuse Maternal Grandfather   . Alcohol abuse Paternal Grandfather   . Alcohol abuse Paternal Aunt   . Alcohol abuse Maternal Uncle    History  Substance Use Topics  . Smoking status: Never Smoker   . Smokeless tobacco: Not on file  .  Alcohol Use: 42.0 oz/week    70 Glasses of wine per week     Comment: reports 3 bottle of wine and pint of or more of vodka daily. 2 bottles of wine currently and pint of vodka some days for at least 4-5 days.a month.     OB History   Grav Para Term Preterm Abortions TAB SAB Ect Mult Living            2     Review of Systems  Constitutional: Negative for fever and chills.  Gastrointestinal: Negative for nausea and vomiting.  Musculoskeletal: Positive for arthralgias and joint swelling. Negative for back pain, neck pain and neck stiffness.  Skin: Negative for wound.  Neurological: Negative for numbness.  Hematological: Does not bruise/bleed easily.  Psychiatric/Behavioral: The patient is not nervous/anxious.   All other systems reviewed and are negative.     Allergies  Morphine and related and Morphine and related  Home Medications   Prior to Admission medications   Medication Sig Start Date End Date Taking? Authorizing Provider  Multiple Vitamin (MULTIVITAMIN WITH MINERALS) TABS tablet Take 1 tablet by mouth daily. For low vitamin 02/05/14  Yes Encarnacion Slates, NP  ibuprofen (ADVIL,MOTRIN) 800 MG tablet Take 1 tablet (800 mg total) by mouth 3 (three) times daily. 05/10/14   Hannah Muthersbaugh, PA-C   BP 135/75  Pulse 100  Temp(Src) 97.9 F (36.6 C) (  Oral)  Resp 16  SpO2 96%  LMP 11/22/2013 Physical Exam  Nursing note and vitals reviewed. Constitutional: She appears well-developed and well-nourished. No distress.  HENT:  Head: Normocephalic and atraumatic.  Eyes: Conjunctivae are normal.  Neck: Normal range of motion.  Cardiovascular: Normal rate, regular rhythm, normal heart sounds and intact distal pulses.   No murmur heard. Pulses:      Radial pulses are 2+ on the right side, and 2+ on the left side.  Capillary refill <3 sec  Pulmonary/Chest: Effort normal and breath sounds normal.  Musculoskeletal: She exhibits tenderness. She exhibits no edema.       Right wrist:  She exhibits tenderness.  ROM: Full ROM of the right shoulder, right elbow and all fingers of the right hand.    Mildly limited extension of the right wrist 2/2 pain; mild contusion noted to the ulnar side of the wrist with minimal pain to palpation.    Neurological: She is alert. Coordination normal.  Sensation sensation intact to dull and sharp Strength 5/5 in the RUE including resisted flexion and extension of the wrist  Skin: Skin is warm and dry. She is not diaphoretic. No erythema.  No tenting of the skin  Psychiatric: She has a normal mood and affect.    ED Course  Procedures (including critical care time) Labs Review Labs Reviewed - No data to display  Imaging Review Dg Wrist Complete Right  05/10/2014   CLINICAL DATA:  Fall, wrist pain  EXAM: RIGHT WRIST - COMPLETE 3+ VIEW  COMPARISON:  None.  FINDINGS: No distal radius or ulnar fracture. Radiocarpal joint is intact. No carpal fracture. No soft tissue abnormality.  IMPRESSION: No acute osseous abnormality.   Electronically Signed   By: Suzy Bouchard M.D.   On: 05/10/2014 01:34     EKG Interpretation None      MDM   Final diagnoses:  Right wrist sprain, initial encounter    Sara [name] presents with right wrist pain after fall.  Patient X-Ray negative for obvious fracture or dislocation. Pain managed in ED. Pt advised to follow up with orthopedics if symptoms persist for possibility of missed fracture diagnosis. Patient given brace while in ED, conservative therapy recommended and discussed. Patient will be dc home & is agreeable with above plan.      Jarrett Soho Muthersbaugh, PA-C 05/10/14 0225

## 2014-05-10 NOTE — Discharge Instructions (Signed)
1. Medications: ibuprofen, usual home medications 2. Treatment: rest, drink plenty of fluids, use splint, elevate, ice 3. Follow Up: Please followup with orthopedics for further evaluation of pain persists  Wrist Sprain with Rehab A sprain is an injury in which a ligament that maintains the proper alignment of a joint is partially or completely torn. The ligaments of the wrist are susceptible to sprains. Sprains are classified into three categories. Grade 1 sprains cause pain, but the tendon is not lengthened. Grade 2 sprains include a lengthened ligament because the ligament is stretched or partially ruptured. With grade 2 sprains there is still function, although the function may be diminished. Grade 3 sprains are characterized by a complete tear of the tendon or muscle, and function is usually impaired. SYMPTOMS   Pain tenderness, inflammation, and/or bruising (contusion) of the injury.  A "pop" or tear felt and/or heard at the time of injury.  Decreased wrist function. CAUSES  A wrist sprain occurs when a force is placed on one or more ligaments that is greater than it/they can withstand. Common mechanisms of injury include:  Catching a ball with you hands.  Repetitive and/ or strenuous extension or flexion of the wrist. RISK INCREASES WITH:  Previous wrist injury.  Contact sports (boxing or wrestling).  Activities in which falling is common.  Poor strength and flexibility.  Improperly fitted or padded protective equipment. PREVENTION  Warm up and stretch properly before activity.  Allow for adequate recovery between workouts.  Maintain physical fitness:  Strength, flexibility, and endurance.  Cardiovascular fitness.  Protect the wrist joint by limiting its motion with the use of taping, braces, or splints.  Protect the wrist after injury for 6 to 12 months. PROGNOSIS  The prognosis for wrist sprains depends on the degree of injury. Grade 1 sprains require 2 to 6  weeks of treatment. Grade 2 sprains require 6 to 8 weeks of treatment, and grade 3 sprains require up to 12 weeks.  RELATED COMPLICATIONS   Prolonged healing time, if improperly treated or re-injured.  Recurrent symptoms that result in a chronic problem.  Injury to nearby structures (bone, cartilage, nerves, or tendons).  Arthritis of the wrist.  Inability to compete in athletics at a high level.  Wrist stiffness or weakness.  Progression to a complete rupture of the ligament. TREATMENT  Treatment initially involves resting from any activities that aggravate the symptoms, and the use of ice and medications to help reduce pain and inflammation. Your caregiver may recommend immobilizing the wrist for a period of time in order to reduce stress on the ligament and allow for healing. After immobilization it is important to perform strengthening and stretching exercises to help regain strength and a full range of motion. These exercises may be completed at home or with a therapist. Surgery is not usually required for wrist sprains, unless the ligament has been ruptured (grade 3 sprain). MEDICATION   If pain medication is necessary, then nonsteroidal anti-inflammatory medications, such as aspirin and ibuprofen, or other minor pain relievers, such as acetaminophen, are often recommended.  Do not take pain medication for 7 days before surgery.  Prescription pain relievers may be given if deemed necessary by your caregiver. Use only as directed and only as much as you need. HEAT AND COLD  Cold treatment (icing) relieves pain and reduces inflammation. Cold treatment should be applied for 10 to 15 minutes every 2 to 3 hours for inflammation and pain and immediately after any activity that aggravates your symptoms.  Use ice packs or massage the area with a piece of ice (ice massage).  Heat treatment may be used prior to performing the stretching and strengthening activities prescribed by your  caregiver, physical therapist, or athletic trainer. Use a heat pack or soak your injury in warm water. SEEK MEDICAL CARE IF:  Treatment seems to offer no benefit, or the condition worsens.  Any medications produce adverse side effects. EXERCISES RANGE OF MOTION (ROM) AND STRETCHING EXERCISES - Wrist Sprain  These exercises may help you when beginning to rehabilitate your injury. Your symptoms may resolve with or without further involvement from your physician, physical therapist or athletic trainer. While completing these exercises, remember:   Restoring tissue flexibility helps normal motion to return to the joints. This allows healthier, less painful movement and activity.  An effective stretch should be held for at least 30 seconds.  A stretch should never be painful. You should only feel a gentle lengthening or release in the stretched tissue. RANGE OF MOTION - Wrist Flexion, Active-Assisted  Extend your right / left elbow with your fingers pointing down.*  Gently pull the back of your hand towards you until you feel a gentle stretch on the top of your forearm.  Hold this position for __________ seconds. Repeat __________ times. Complete this exercise __________ times per day.  *If directed by your physician, physical therapist or athletic trainer, complete this stretch with your elbow bent rather than extended. RANGE OF MOTION - Wrist Extension, Active-Assisted  Extend your right / left elbow and turn your palm upwards.*  Gently pull your palm/fingertips back so your wrist extends and your fingers point more toward the ground.  You should feel a gentle stretch on the inside of your forearm.  Hold this position for __________ seconds. Repeat __________ times. Complete this exercise __________ times per day. *If directed by your physician, physical therapist or athletic trainer, complete this stretch with your elbow bent, rather than extended. RANGE OF MOTION - Supination,  Active  Stand or sit with your elbows at your side. Bend your right / left elbow to 90 degrees.  Turn your palm upward until you feel a gentle stretch on the inside of your forearm.  Hold this position for __________ seconds. Slowly release and return to the starting position. Repeat __________ times. Complete this stretch __________ times per day.  RANGE OF MOTION - Pronation, Active  Stand or sit with your elbows at your side. Bend your right / left elbow to 90 degrees.  Turn your palm downward until you feel a gentle stretch on the top of your forearm.  Hold this position for __________ seconds. Slowly release and return to the starting position. Repeat __________ times. Complete this stretch __________ times per day.  STRETCH - Wrist Flexion  Place the back of your right / left hand on a tabletop leaving your elbow slightly bent. Your fingers should point away from your body.  Gently press the back of your hand down onto the table by straightening your elbow. You should feel a stretch on the top of your forearm.  Hold this position for __________ seconds. Repeat __________ times. Complete this stretch __________ times per day.  STRETCH - Wrist Extension  Place your right / left fingertips on a tabletop leaving your elbow slightly bent. Your fingers should point backwards.  Gently press your fingers and palm down onto the table by straightening your elbow. You should feel a stretch on the inside of your forearm.  Hold this  position for __________ seconds. Repeat __________ times. Complete this stretch __________ times per day.  STRENGTHENING EXERCISES - Wrist Sprain These exercises may help you when beginning to rehabilitate your injury. They may resolve your symptoms with or without further involvement from your physician, physical therapist or athletic trainer. While completing these exercises, remember:   Muscles can gain both the endurance and the strength needed for  everyday activities through controlled exercises.  Complete these exercises as instructed by your physician, physical therapist or athletic trainer. Progress with the resistance and repetition exercises only as your caregiver advises. STRENGTH - Wrist Flexors  Sit with your right / left forearm palm-up and fully supported. Your elbow should be resting below the height of your shoulder. Allow your wrist to extend over the edge of the surface.  Loosely holding a __________ weight or a piece of rubber exercise band/tubing, slowly curl your hand up toward your forearm.  Hold this position for __________ seconds. Slowly lower the wrist back to the starting position in a controlled manner. Repeat __________ times. Complete this exercise __________ times per day.  STRENGTH - Wrist Extensors  Sit with your right / left forearm palm-down and fully supported. Your elbow should be resting below the height of your shoulder. Allow your wrist to extend over the edge of the surface.  Loosely holding a __________ weight or a piece of rubber exercise band/tubing, slowly curl your hand up toward your forearm.  Hold this position for __________ seconds. Slowly lower the wrist back to the starting position in a controlled manner. Repeat __________ times. Complete this exercise __________ times per day.  STRENGTH - Ulnar Deviators  Stand with a ____________________ weight in your right / left hand, or sit holding on to the rubber exercise band/tubing with your opposite arm supported.  Move your wrist so that your pinkie travels toward your forearm and your thumb moves away from your forearm.  Hold this position for __________ seconds and then slowly lower the wrist back to the starting position. Repeat __________ times. Complete this exercise __________ times per day STRENGTH - Radial Deviators  Stand with a ____________________ weight in your  right / left hand, or sit holding on to the rubber exercise  band/tubing with your arm supported.  Raise your hand upward in front of you or pull up on the rubber tubing.  Hold this position for __________ seconds and then slowly lower the wrist back to the starting position. Repeat __________ times. Complete this exercise __________ times per day. STRENGTH - Forearm Supinators  Sit with your right / left forearm supported on a table, keeping your elbow below shoulder height. Rest your hand over the edge, palm down.  Gently grip a hammer or a soup ladle.  Without moving your elbow, slowly turn your palm and hand upward to a "thumbs-up" position.  Hold this position for __________ seconds. Slowly return to the starting position. Repeat __________ times. Complete this exercise __________ times per day.  STRENGTH - Forearm Pronators  Sit with your right / left forearm supported on a table, keeping your elbow below shoulder height. Rest your hand over the edge, palm up.  Gently grip a hammer or a soup ladle.  Without moving your elbow, slowly turn your palm and hand upward to a "thumbs-up" position.  Hold this position for __________ seconds. Slowly return to the starting position. Repeat __________ times. Complete this exercise __________ times per day.  STRENGTH - Grip  Grasp a tennis ball, a dense  sponge, or a large, rolled sock in your hand.  Squeeze as hard as you can without increasing any pain.  Hold this position for __________ seconds. Release your grip slowly. Repeat __________ times. Complete this exercise __________ times per day.  Document Released: 11/07/2005 Document Revised: 01/30/2012 Document Reviewed: 02/19/2009 [facility] Patient Information 2015 [place], [place]. This information is not intended to replace advice given to you by your health care provider. Make sure you discuss any questions you have with your health care provider.

## 2014-05-10 NOTE — ED Notes (Signed)
Patient states that she feel down that stairs today and she is hurt. The patient has pain to the right wrist.

## 2014-05-10 NOTE — ED Notes (Signed)
Bed: WA02 Expected date:  Expected time:  Means of arrival:  Comments: 

## 2014-05-10 NOTE — ED Provider Notes (Signed)
Medical screening examination/treatment/procedure(s) were conducted as a shared visit with non-physician practitioner(s) or resident  and myself.  I personally evaluated the patient during the encounter and agree with the findings and plan unless otherwise indicated.    I have personally reviewed any xrays and/ or EKG's with the provider and I agree with interpretation.   Patient with acute right wrist pain after falling down a few stairs and catching herself. Patient denies head injury loss of consciousness or other injuries. Pain with range of motion. X-ray no acute fracture and patient well-appearing otherwise. Patient has mild lateral wrist pain with palpation specifically ulnar styloid, no scaphoid tenderness, full range of motion with mild discomfort. Followup outpatient.  Mariea Clonts, MD 05/10/14 [phone]

## 2014-06-08 NOTE — Progress Notes (Signed)
This encounter was created in error - please disregard.

## 2014-06-10 ENCOUNTER — Emergency Department (HOSPITAL_COMMUNITY)
Admission: EM | Admit: 2014-06-10 | Discharge: 2014-06-10 | Discharge status: Against Medical Advice | Payer: 59 | Attending: Emergency Medicine | Admitting: Emergency Medicine

## 2014-06-10 ENCOUNTER — Encounter (HOSPITAL_COMMUNITY): Payer: Self-pay | Admitting: Emergency Medicine

## 2014-06-10 DIAGNOSIS — F1010 Alcohol abuse, uncomplicated: Secondary | ICD-10-CM | POA: Diagnosis present

## 2014-06-10 NOTE — ED Notes (Signed)
Patient reports hx of vomiting blood for several days and back pain and requesting detox from alcohol. Reports last drink this AM and drinks approx 3 bottles of wine per day.

## 2014-06-10 NOTE — ED Notes (Signed)
Husband came to desk and states they have a family emergency and must leave. He states they will come back later. Encouraged pt to stay for medical screening but states they must leave now

## 2014-06-11 ENCOUNTER — Encounter (HOSPITAL_COMMUNITY): Payer: Self-pay | Admitting: Emergency Medicine

## 2014-06-11 ENCOUNTER — Emergency Department (HOSPITAL_COMMUNITY): Payer: 59

## 2014-06-11 ENCOUNTER — Inpatient Hospital Stay (HOSPITAL_COMMUNITY)
Admission: EM | Admit: 2014-06-11 | Discharge: 2014-06-16 | DRG: 368 | Disposition: A | Discharge status: Home / Self Care | Payer: 59 | Attending: Internal Medicine | Admitting: Internal Medicine

## 2014-06-11 DIAGNOSIS — F3289 Other specified depressive episodes: Secondary | ICD-10-CM | POA: Diagnosis present

## 2014-06-11 DIAGNOSIS — Z8249 Family history of ischemic heart disease and other diseases of the circulatory system: Secondary | ICD-10-CM

## 2014-06-11 DIAGNOSIS — F1020 Alcohol dependence, uncomplicated: Secondary | ICD-10-CM | POA: Diagnosis present

## 2014-06-11 DIAGNOSIS — K861 Other chronic pancreatitis: Secondary | ICD-10-CM | POA: Diagnosis present

## 2014-06-11 DIAGNOSIS — R079 Chest pain, unspecified: Secondary | ICD-10-CM | POA: Diagnosis present

## 2014-06-11 DIAGNOSIS — K7030 Alcoholic cirrhosis of liver without ascites: Secondary | ICD-10-CM | POA: Diagnosis present

## 2014-06-11 DIAGNOSIS — Z8371 Family history of colonic polyps: Secondary | ICD-10-CM

## 2014-06-11 DIAGNOSIS — I8501 Esophageal varices with bleeding: Secondary | ICD-10-CM | POA: Diagnosis not present

## 2014-06-11 DIAGNOSIS — R7402 Elevation of levels of lactic acid dehydrogenase (LDH): Secondary | ICD-10-CM | POA: Diagnosis present

## 2014-06-11 DIAGNOSIS — R1013 Epigastric pain: Secondary | ICD-10-CM | POA: Diagnosis not present

## 2014-06-11 DIAGNOSIS — K2920 Alcoholic gastritis without bleeding: Secondary | ICD-10-CM | POA: Diagnosis present

## 2014-06-11 DIAGNOSIS — F411 Generalized anxiety disorder: Secondary | ICD-10-CM | POA: Diagnosis present

## 2014-06-11 DIAGNOSIS — D62 Acute posthemorrhagic anemia: Secondary | ICD-10-CM | POA: Diagnosis present

## 2014-06-11 DIAGNOSIS — F419 Anxiety disorder, unspecified: Secondary | ICD-10-CM | POA: Diagnosis present

## 2014-06-11 DIAGNOSIS — K766 Portal hypertension: Secondary | ICD-10-CM | POA: Diagnosis present

## 2014-06-11 DIAGNOSIS — Z83719 Family history of colon polyps, unspecified: Secondary | ICD-10-CM

## 2014-06-11 DIAGNOSIS — K922 Gastrointestinal hemorrhage, unspecified: Secondary | ICD-10-CM

## 2014-06-11 DIAGNOSIS — K7010 Alcoholic hepatitis without ascites: Secondary | ICD-10-CM | POA: Diagnosis present

## 2014-06-11 DIAGNOSIS — K859 Acute pancreatitis without necrosis or infection, unspecified: Secondary | ICD-10-CM | POA: Diagnosis present

## 2014-06-11 DIAGNOSIS — R7401 Elevation of levels of liver transaminase levels: Secondary | ICD-10-CM | POA: Diagnosis present

## 2014-06-11 DIAGNOSIS — F1010 Alcohol abuse, uncomplicated: Secondary | ICD-10-CM

## 2014-06-11 DIAGNOSIS — Z6379 Other stressful life events affecting family and household: Secondary | ICD-10-CM

## 2014-06-11 DIAGNOSIS — F329 Major depressive disorder, single episode, unspecified: Secondary | ICD-10-CM | POA: Diagnosis present

## 2014-06-11 DIAGNOSIS — K219 Gastro-esophageal reflux disease without esophagitis: Secondary | ICD-10-CM | POA: Diagnosis present

## 2014-06-11 DIAGNOSIS — R161 Splenomegaly, not elsewhere classified: Secondary | ICD-10-CM | POA: Diagnosis present

## 2014-06-11 DIAGNOSIS — K920 Hematemesis: Secondary | ICD-10-CM

## 2014-06-11 DIAGNOSIS — D696 Thrombocytopenia, unspecified: Secondary | ICD-10-CM | POA: Diagnosis present

## 2014-06-11 DIAGNOSIS — R109 Unspecified abdominal pain: Secondary | ICD-10-CM

## 2014-06-11 DIAGNOSIS — F10129 Alcohol abuse with intoxication, unspecified: Secondary | ICD-10-CM

## 2014-06-11 DIAGNOSIS — D500 Iron deficiency anemia secondary to blood loss (chronic): Secondary | ICD-10-CM | POA: Diagnosis present

## 2014-06-11 DIAGNOSIS — D509 Iron deficiency anemia, unspecified: Secondary | ICD-10-CM

## 2014-06-11 DIAGNOSIS — E440 Moderate protein-calorie malnutrition: Secondary | ICD-10-CM | POA: Diagnosis present

## 2014-06-11 DIAGNOSIS — D649 Anemia, unspecified: Secondary | ICD-10-CM | POA: Diagnosis present

## 2014-06-11 DIAGNOSIS — F1994 Other psychoactive substance use, unspecified with psychoactive substance-induced mood disorder: Secondary | ICD-10-CM | POA: Diagnosis present

## 2014-06-11 DIAGNOSIS — R740 Nonspecific elevation of levels of transaminase and lactic acid dehydrogenase [LDH]: Secondary | ICD-10-CM

## 2014-06-11 HISTORY — DX: Esophageal varices with bleeding: I85.01

## 2014-06-11 LAB — URINALYSIS, ROUTINE W REFLEX MICROSCOPIC
BILIRUBIN URINE: NEGATIVE
Glucose, UA: NEGATIVE mg/dL
Hgb urine dipstick: NEGATIVE
Ketones, ur: NEGATIVE mg/dL
Leukocytes, UA: NEGATIVE
Nitrite: NEGATIVE
PROTEIN: NEGATIVE mg/dL
Specific Gravity, Urine: 1.005 (ref 1.005–1.030)
UROBILINOGEN UA: 1 mg/dL (ref 0.0–1.0)
pH: 6.5 (ref 5.0–8.0)

## 2014-06-11 LAB — CBC
HCT: 35.9 % — ABNORMAL LOW (ref 36.0–46.0)
Hemoglobin: 11.6 g/dL — ABNORMAL LOW (ref 12.0–15.0)
MCH: 31.1 pg (ref 26.0–34.0)
MCHC: 32.3 g/dL (ref 30.0–36.0)
MCV: 96.2 fL (ref 78.0–100.0)
Platelets: 59 10*3/uL — ABNORMAL LOW (ref 150–400)
RBC: 3.73 MIL/uL — AB (ref 3.87–5.11)
RDW: 14.3 % (ref 11.5–15.5)
WBC: 6.1 10*3/uL (ref 4.0–10.5)

## 2014-06-11 LAB — RAPID URINE DRUG SCREEN, HOSP PERFORMED
AMPHETAMINES: NOT DETECTED
Barbiturates: NOT DETECTED
Benzodiazepines: NOT DETECTED
Cocaine: NOT DETECTED
Opiates: NOT DETECTED
TETRAHYDROCANNABINOL: NOT DETECTED

## 2014-06-11 LAB — BASIC METABOLIC PANEL
ANION GAP: 16 — AB (ref 5–15)
BUN: 5 mg/dL — AB (ref 6–23)
CO2: 24 meq/L (ref 19–32)
CREATININE: 0.55 mg/dL (ref 0.50–1.10)
Calcium: 8.9 mg/dL (ref 8.4–10.5)
Chloride: 100 mEq/L (ref 96–112)
GFR calc Af Amer: >90 mL/min (ref >90)
GFR calc non Af Amer: >90 mL/min (ref >90)
Glucose, Bld: 139 mg/dL — ABNORMAL HIGH (ref 70–99)
Potassium: 4.3 mEq/L (ref 3.7–5.3)
Sodium: 140 mEq/L (ref 137–147)

## 2014-06-11 LAB — HEPATIC FUNCTION PANEL
ALBUMIN: 3.5 g/dL (ref 3.5–5.2)
ALK PHOS: 137 U/L — AB (ref 39–117)
ALT: 53 U/L — ABNORMAL HIGH (ref 0–35)
AST: 190 U/L — AB (ref 0–37)
Bilirubin, Direct: 0.6 mg/dL — ABNORMAL HIGH (ref 0.0–0.3)
Indirect Bilirubin: 0.7 mg/dL (ref 0.3–0.9)
Total Bilirubin: 1.3 mg/dL — ABNORMAL HIGH (ref 0.3–1.2)
Total Protein: 8.5 g/dL — ABNORMAL HIGH (ref 6.0–8.3)

## 2014-06-11 LAB — POC URINE PREG, ED: PREG TEST UR: NEGATIVE

## 2014-06-11 LAB — POC OCCULT BLOOD, ED: FECAL OCCULT BLD: NEGATIVE

## 2014-06-11 LAB — I-STAT TROPONIN, ED: Troponin i, poc: 0 ng/mL (ref 0.00–0.08)

## 2014-06-11 LAB — PRO B NATRIURETIC PEPTIDE: Pro B Natriuretic peptide (BNP): 5 pg/mL (ref 0–125)

## 2014-06-11 LAB — ETHANOL: Alcohol, Ethyl (B): 356 mg/dL — ABNORMAL HIGH (ref 0–11)

## 2014-06-11 LAB — I-STAT CG4 LACTIC ACID, ED: LACTIC ACID, VENOUS: 1.46 mmol/L (ref 0.5–2.2)

## 2014-06-11 MED ORDER — LORAZEPAM 2 MG/ML IJ SOLN
1.0000 mg | Freq: Once | INTRAMUSCULAR | Status: AC
  Administered 2014-06-11: 1 mg via INTRAVENOUS
  Filled 2014-06-11: qty 1

## 2014-06-11 MED ORDER — ASPIRIN 325 MG PO TABS
325.0000 mg | ORAL_TABLET | ORAL | Status: AC
  Administered 2014-06-11: 325 mg via ORAL
  Filled 2014-06-11: qty 1

## 2014-06-11 NOTE — ED Notes (Signed)
Pt states she has been having lower central chest pain. Pt states it started yesterday and has been intermittent. Denies any n/v/or diaphoresis but has had weakness and dizziness. Pt also an alcoholic and is requesting detox. Drank two bottles of wine today. Pt is A&O x4 and calm/cooperative.

## 2014-06-11 NOTE — ED Notes (Signed)
PA at bedside.

## 2014-06-11 NOTE — ED Provider Notes (Signed)
CSN: [id number]     Arrival date & time 06/11/14  2139 History   First MD Initiated Contact with Patient 06/11/14 2223     Chief Complaint  Patient presents with  . Alcohol Intoxication  . Chest Pain     (Consider location/radiation/quality/duration/timing/severity/associated sxs/prior Treatment) HPI Comments: Patient is a 38 year old female with history of alcoholism, portal hypertension, cirrhosis, depression, anxiety, esophageal varices who presents today for chest pain and hematemesis. She reports that she has been vomiting bright red blood for the past 2 days. She describes her chest pain is sharp and crushing. Her chest pain radiates to her back. When the pain comes on and off for seconds at a time. Nothing seems to trigger this pain. Nothing improves the pain. She denies any melena or hematochezia. She was diagnosed with esophageal varices during an endoscopy 2 years ago. She is unsure who her gastroenterologist was. She has not followed up with them in the past 2 years. She continues to drink alcohol. She drinks approximately 2 bottles of wine a day which is down from 3 bottles of wine a day. Her last drink was at 7 PM tonight. She denies any other drug use. She reports last using cocaine in February. She denies any history of prior heart disease no family history of early heart disease. She does not smoke cigarettes. She has recently tried detox at behavioral health [facility]. She reports that soon after leaving the hospital she began drinking again. She did not use her outpatient support system.  Patient is a 38 y.o. female presenting with intoxication and chest pain. The history is provided by the patient. No language interpreter was used.  Alcohol Intoxication Associated symptoms include abdominal pain, chest pain, nausea and vomiting. Pertinent negatives include no chills or fever.  Chest Pain Associated symptoms: abdominal pain, nausea, shortness of breath and vomiting   Associated  symptoms: no fever     Past Medical History  Diagnosis Date  . Depression   . Pneumonia   . Cirrhosis   . Portal hypertension   . S/P alcohol detoxification     2-3 days at behavioral health previously  . Alcoholism   . Anxiety   . GERD (gastroesophageal reflux disease)    Past Surgical History  Procedure Laterality Date  . Cholecystectomy     Family History  Problem Relation Age of Onset  . Colon polyps Mother   . Hypertension Mother   . Thyroid disease Mother   . Alcoholism Mother   . Alcoholism Father   . Alcohol abuse Maternal Grandfather   . Alcohol abuse Paternal Grandfather   . Alcohol abuse Paternal Aunt   . Alcohol abuse Maternal Uncle    History  Substance Use Topics  . Smoking status: Never Smoker   . Smokeless tobacco: Never Used  . Alcohol Use: 42.0 oz/week    70 Glasses of wine per week     Comment: reports 3 bottle of wine and pint of or more of vodka daily. 2 bottles of wine currently and pint of vodka some days for at least 4-5 days.a month.     OB History   Grav Para Term Preterm Abortions TAB SAB Ect Mult Living            2     Review of Systems  Constitutional: Negative for fever and chills.  Respiratory: Positive for shortness of breath.   Cardiovascular: Positive for chest pain. Negative for leg swelling.  Gastrointestinal: Positive for nausea, vomiting and  abdominal pain. Negative for diarrhea, constipation, blood in stool and anal bleeding.  All other systems reviewed and are negative.     Allergies  Morphine and related and Morphine and related  Home Medications   Prior to Admission medications   Medication Sig Start Date End Date Taking? Authorizing Provider  folic acid (FOLVITE) 1 MG tablet Take 1 mg by mouth daily.   Yes Historical Provider, MD  ibuprofen (ADVIL,MOTRIN) 800 MG tablet Take 1 tablet (800 mg total) by mouth 3 (three) times daily. 05/10/14  Yes Latesha Chesney Muthersbaugh, PA-C  Multiple Vitamin (MULTIVITAMIN WITH  MINERALS) TABS tablet Take 1 tablet by mouth daily. For low vitamin 02/05/14  Yes Encarnacion Slates, NP  pantoprazole (PROTONIX) 40 MG tablet Take 40 mg by mouth daily.   Yes Historical Provider, MD   BP 109/61  Pulse 84  Temp(Src) 98.6 F (37 C) (Oral)  Resp 13  Ht 5\' 4"  (1.626 m)  Wt 170 lb (77.111 kg)  BMI 29.17 kg/m2  SpO2 92% Physical Exam  Nursing note and vitals reviewed. Constitutional: She is oriented to person, place, and time. She appears well-developed and well-nourished. No distress.  HENT:  Head: Normocephalic and atraumatic.  Right Ear: External ear normal.  Left Ear: External ear normal.  Nose: Nose normal.  Mouth/Throat: Oropharynx is clear and moist.  Eyes: Conjunctivae are normal.  Neck: Normal range of motion.  Cardiovascular: Normal rate, regular rhythm, normal heart sounds, intact distal pulses and normal pulses.   Pulses:      Radial pulses are 2+ on the right side, and 2+ on the left side.       Posterior tibial pulses are 2+ on the right side, and 2+ on the left side.  Pulmonary/Chest: Effort normal and breath sounds normal. No stridor. No respiratory distress. She has no wheezes. She has no rales.  Abdominal: Soft. She exhibits no distension. There is tenderness in the epigastric area.  Mild tenderness to palpation  Musculoskeletal: Normal range of motion.  Neurological: She is alert and oriented to person, place, and time. She has normal strength.  Skin: Skin is warm and dry. She is not diaphoretic. No erythema.  Psychiatric: She has a normal mood and affect. Her behavior is normal.    ED Course  Procedures (including critical care time) Labs Review Labs Reviewed  CBC - Abnormal; Notable for the following:    RBC 3.73 (*)    Hemoglobin 11.6 (*)    HCT 35.9 (*)    Platelets 59 (*)    All other components within normal limits  BASIC METABOLIC PANEL - Abnormal; Notable for the following:    Glucose, Bld 139 (*)    BUN 5 (*)    Anion gap 16 (*)     All other components within normal limits  HEPATIC FUNCTION PANEL - Abnormal; Notable for the following:    Total Protein 8.5 (*)    AST 190 (*)    ALT 53 (*)    Alkaline Phosphatase 137 (*)    Total Bilirubin 1.3 (*)    Bilirubin, Direct 0.6 (*)    All other components within normal limits  ETHANOL - Abnormal; Notable for the following:    Alcohol, Ethyl (B) 356 (*)    All other components within normal limits  URINE CULTURE  URINALYSIS, ROUTINE W REFLEX MICROSCOPIC  URINE RAPID DRUG SCREEN (HOSP PERFORMED)  PRO B NATRIURETIC PEPTIDE  HEMOGLOBIN AND HEMATOCRIT, BLOOD  HEMOGLOBIN AND HEMATOCRIT, BLOOD  HEMOGLOBIN AND  HEMATOCRIT, BLOOD  HEMOGLOBIN AND HEMATOCRIT, BLOOD  PROTIME-INR  LIPASE, BLOOD  I-STAT TROPOININ, ED  POC URINE PREG, ED  I-STAT CG4 LACTIC ACID, ED  POC OCCULT BLOOD, ED  TYPE AND SCREEN    Imaging Review Dg Chest Port 1 View  06/11/2014   CLINICAL DATA:  Chest pain, radiating to the back.  EXAM: PORTABLE CHEST - 1 VIEW  COMPARISON:  Chest radiograph performed 10/08/2013  FINDINGS: The lungs are well-aerated. Pulmonary vascularity is at the upper limits of normal. There is no evidence of focal opacification, pleural effusion or pneumothorax.  The cardiomediastinal silhouette is borderline enlarged. No acute osseous abnormalities are seen.  IMPRESSION: Borderline cardiomegaly.  No acute cardiopulmonary process seen.   Electronically Signed   By: Garald Balding M.D.   On: 06/11/2014 22:40     EKG Interpretation None        MDM   Final diagnoses:  UGI bleed  Abdominal pain, other specified site  Alcohol abuse with intoxication  Iron deficiency anemia  Alcoholic cirrhosis of liver without ascites  Portal hypertension  Substance induced mood disorder    1:10 AM Discussed case with Dr. Amedeo Plenty of [facility] who is aware of patient. He will consult in am, or sooner should patient begin to become unstable.   Patient presents to ED with complaints of  hematemesis, alcohol intoxication, and chest pain. Patient with alcohol of 356 in ED. Given hx of hematemesis and esophageal varices I believe it is in the best interest of the patient to be seen by GI and observed overnight. Discussed case with Dr. Candiss Norse who agrees to admission. Dr. Amedeo Plenty was consulted and will see patient in am unless patient starts to decompensate. Admission and consultation are appreciated. Patient would like detox. Given this information I cannot medically clear her until she is seen by GI and hematemesis has resolved. Patient is currently hemodynamically stable. Discussed case with Dr. Sharol Given who agrees with plan. Patient / Family / Caregiver informed of clinical course, understand medical decision-making process, and agree with plan.    Elwyn Lade, PA-C 06/12/14 [place], PA-C 06/12/14 [phone]

## 2014-06-12 ENCOUNTER — Encounter (HOSPITAL_COMMUNITY)
Admission: EM | Disposition: A | Discharge status: Home / Self Care | Payer: Self-pay | Source: Home / Self Care | Attending: Internal Medicine

## 2014-06-12 DIAGNOSIS — F1994 Other psychoactive substance use, unspecified with psychoactive substance-induced mood disorder: Secondary | ICD-10-CM

## 2014-06-12 DIAGNOSIS — Z6379 Other stressful life events affecting family and household: Secondary | ICD-10-CM | POA: Diagnosis not present

## 2014-06-12 DIAGNOSIS — K7030 Alcoholic cirrhosis of liver without ascites: Secondary | ICD-10-CM

## 2014-06-12 DIAGNOSIS — D62 Acute posthemorrhagic anemia: Secondary | ICD-10-CM | POA: Diagnosis present

## 2014-06-12 DIAGNOSIS — Z8249 Family history of ischemic heart disease and other diseases of the circulatory system: Secondary | ICD-10-CM | POA: Diagnosis not present

## 2014-06-12 DIAGNOSIS — E440 Moderate protein-calorie malnutrition: Secondary | ICD-10-CM | POA: Diagnosis present

## 2014-06-12 DIAGNOSIS — F1020 Alcohol dependence, uncomplicated: Secondary | ICD-10-CM | POA: Diagnosis present

## 2014-06-12 DIAGNOSIS — F3289 Other specified depressive episodes: Secondary | ICD-10-CM | POA: Diagnosis present

## 2014-06-12 DIAGNOSIS — R7402 Elevation of levels of lactic acid dehydrogenase (LDH): Secondary | ICD-10-CM | POA: Diagnosis present

## 2014-06-12 DIAGNOSIS — R079 Chest pain, unspecified: Secondary | ICD-10-CM | POA: Diagnosis present

## 2014-06-12 DIAGNOSIS — I8501 Esophageal varices with bleeding: Secondary | ICD-10-CM | POA: Diagnosis present

## 2014-06-12 DIAGNOSIS — D509 Iron deficiency anemia, unspecified: Secondary | ICD-10-CM

## 2014-06-12 DIAGNOSIS — K922 Gastrointestinal hemorrhage, unspecified: Secondary | ICD-10-CM

## 2014-06-12 DIAGNOSIS — K920 Hematemesis: Secondary | ICD-10-CM

## 2014-06-12 DIAGNOSIS — K766 Portal hypertension: Secondary | ICD-10-CM | POA: Diagnosis present

## 2014-06-12 DIAGNOSIS — R161 Splenomegaly, not elsewhere classified: Secondary | ICD-10-CM | POA: Diagnosis present

## 2014-06-12 DIAGNOSIS — R110 Nausea: Secondary | ICD-10-CM

## 2014-06-12 DIAGNOSIS — R7401 Elevation of levels of liver transaminase levels: Secondary | ICD-10-CM | POA: Diagnosis present

## 2014-06-12 DIAGNOSIS — K2920 Alcoholic gastritis without bleeding: Secondary | ICD-10-CM | POA: Diagnosis present

## 2014-06-12 DIAGNOSIS — K7010 Alcoholic hepatitis without ascites: Secondary | ICD-10-CM | POA: Diagnosis present

## 2014-06-12 DIAGNOSIS — K219 Gastro-esophageal reflux disease without esophagitis: Secondary | ICD-10-CM | POA: Diagnosis present

## 2014-06-12 DIAGNOSIS — D696 Thrombocytopenia, unspecified: Secondary | ICD-10-CM | POA: Diagnosis present

## 2014-06-12 DIAGNOSIS — D500 Iron deficiency anemia secondary to blood loss (chronic): Secondary | ICD-10-CM | POA: Diagnosis present

## 2014-06-12 DIAGNOSIS — K861 Other chronic pancreatitis: Secondary | ICD-10-CM | POA: Diagnosis present

## 2014-06-12 DIAGNOSIS — F329 Major depressive disorder, single episode, unspecified: Secondary | ICD-10-CM | POA: Diagnosis present

## 2014-06-12 DIAGNOSIS — F411 Generalized anxiety disorder: Secondary | ICD-10-CM | POA: Diagnosis present

## 2014-06-12 DIAGNOSIS — R109 Unspecified abdominal pain: Secondary | ICD-10-CM

## 2014-06-12 DIAGNOSIS — R1013 Epigastric pain: Secondary | ICD-10-CM | POA: Diagnosis present

## 2014-06-12 DIAGNOSIS — K859 Acute pancreatitis without necrosis or infection, unspecified: Secondary | ICD-10-CM | POA: Diagnosis present

## 2014-06-12 DIAGNOSIS — Z8371 Family history of colonic polyps: Secondary | ICD-10-CM | POA: Diagnosis not present

## 2014-06-12 HISTORY — PX: ESOPHAGOGASTRODUODENOSCOPY: SHX5428

## 2014-06-12 HISTORY — DX: Gastrointestinal hemorrhage, unspecified: K92.2

## 2014-06-12 LAB — MRSA PCR SCREENING: MRSA by PCR: NEGATIVE

## 2014-06-12 LAB — BASIC METABOLIC PANEL
Anion gap: 13 (ref 5–15)
BUN: 8 mg/dL (ref 6–23)
CO2: 26 mEq/L (ref 19–32)
CREATININE: 0.52 mg/dL (ref 0.50–1.10)
Calcium: 8.2 mg/dL — ABNORMAL LOW (ref 8.4–10.5)
Chloride: 105 mEq/L (ref 96–112)
GFR calc Af Amer: >90 mL/min (ref >90)
Glucose, Bld: 102 mg/dL — ABNORMAL HIGH (ref 70–99)
POTASSIUM: 3.8 meq/L (ref 3.7–5.3)
Sodium: 144 mEq/L (ref 137–147)

## 2014-06-12 LAB — HEMOGLOBIN AND HEMATOCRIT, BLOOD
HCT: 33.2 % — ABNORMAL LOW (ref 36.0–46.0)
HCT: 32.1 % — ABNORMAL LOW (ref 36.0–46.0)
HCT: 32.2 % — ABNORMAL LOW (ref 36.0–46.0)
HEMOGLOBIN: 10.5 g/dL — AB (ref 12.0–15.0)
Hemoglobin: 10.8 g/dL — ABNORMAL LOW (ref 12.0–15.0)
Hemoglobin: 10.6 g/dL — ABNORMAL LOW (ref 12.0–15.0)

## 2014-06-12 LAB — PROTIME-INR
INR: 1.25 (ref 0.00–1.49)
Prothrombin Time: 15.7 seconds — ABNORMAL HIGH (ref 11.6–15.2)

## 2014-06-12 LAB — CBC
HCT: 30.2 % — ABNORMAL LOW (ref 36.0–46.0)
Hemoglobin: 9.8 g/dL — ABNORMAL LOW (ref 12.0–15.0)
MCH: 31.5 pg (ref 26.0–34.0)
MCHC: 32.5 g/dL (ref 30.0–36.0)
MCV: 97.1 fL (ref 78.0–100.0)
PLATELETS: 48 10*3/uL — AB (ref 150–400)
RBC: 3.11 MIL/uL — AB (ref 3.87–5.11)
RDW: 14.5 % (ref 11.5–15.5)
WBC: 3.7 10*3/uL — ABNORMAL LOW (ref 4.0–10.5)

## 2014-06-12 LAB — TYPE AND SCREEN
ABO/RH(D): A POS
Antibody Screen: NEGATIVE

## 2014-06-12 LAB — LIPASE, BLOOD: Lipase: 150 U/L — ABNORMAL HIGH (ref 11–59)

## 2014-06-12 SURGERY — EGD (ESOPHAGOGASTRODUODENOSCOPY)
Anesthesia: Moderate Sedation

## 2014-06-12 MED ORDER — LORAZEPAM 2 MG/ML IJ SOLN
1.0000 mg | Freq: Once | INTRAMUSCULAR | Status: AC
  Administered 2014-06-12: 1 mg via INTRAVENOUS
  Filled 2014-06-12: qty 1

## 2014-06-12 MED ORDER — LORAZEPAM 2 MG/ML IJ SOLN
1.0000 mg | Freq: Four times a day (QID) | INTRAMUSCULAR | Status: AC | PRN
  Administered 2014-06-12 – 2014-06-13 (×2): 1 mg via INTRAVENOUS
  Filled 2014-06-12 (×2): qty 1

## 2014-06-12 MED ORDER — FENTANYL CITRATE 0.05 MG/ML IJ SOLN
INTRAMUSCULAR | Status: AC
  Filled 2014-06-12: qty 2

## 2014-06-12 MED ORDER — MIDAZOLAM HCL 10 MG/2ML IJ SOLN
INTRAMUSCULAR | Status: DC | PRN
  Administered 2014-06-12 (×6): 2 mg via INTRAVENOUS

## 2014-06-12 MED ORDER — ONDANSETRON 8 MG/NS 50 ML IVPB
8.0000 mg | Freq: Four times a day (QID) | INTRAVENOUS | Status: DC
  Administered 2014-06-12 – 2014-06-16 (×16): 8 mg via INTRAVENOUS
  Filled 2014-06-12 (×17): qty 8

## 2014-06-12 MED ORDER — GUAIFENESIN-DM 100-10 MG/5ML PO SYRP: 5.0000 mL | ORAL_SOLUTION | ORAL | Status: DC | PRN

## 2014-06-12 MED ORDER — VITAMIN B-1 100 MG PO TABS
100.0000 mg | ORAL_TABLET | Freq: Every day | ORAL | Status: DC
Start: 2014-06-12 — End: 2014-06-12

## 2014-06-12 MED ORDER — MIDAZOLAM HCL 2 MG/2ML IJ SOLN
INTRAMUSCULAR | Status: AC
  Filled 2014-06-12: qty 2

## 2014-06-12 MED ORDER — SODIUM CHLORIDE 0.9 % IV SOLN
INTRAVENOUS | Status: AC
  Administered 2014-06-12: 01:00:00 via INTRAVENOUS

## 2014-06-12 MED ORDER — FOLIC ACID 1 MG PO TABS
1.0000 mg | ORAL_TABLET | Freq: Every day | ORAL | Status: DC
  Administered 2014-06-13 – 2014-06-16 (×4): 1 mg via ORAL
  Filled 2014-06-12 (×4): qty 1

## 2014-06-12 MED ORDER — SODIUM CHLORIDE 0.9 % IV SOLN
25.0000 ug/h | INTRAVENOUS | Status: DC
  Administered 2014-06-12: 50 ug/h via INTRAVENOUS
  Administered 2014-06-12 – 2014-06-16 (×5): 25 ug/h via INTRAVENOUS
  Filled 2014-06-12 (×15): qty 1

## 2014-06-12 MED ORDER — POLYETHYLENE GLYCOL 3350 17 G PO PACK
17.0000 g | PACK | Freq: Every day | ORAL | Status: DC | PRN
  Filled 2014-06-12: qty 1

## 2014-06-12 MED ORDER — MIDAZOLAM HCL 10 MG/2ML IJ SOLN
INTRAMUSCULAR | Status: AC
  Filled 2014-06-12: qty 2

## 2014-06-12 MED ORDER — DEXTROSE 5 % IV SOLN
2.0000 g | INTRAVENOUS | Status: DC
  Administered 2014-06-12 – 2014-06-15 (×4): 2 g via INTRAVENOUS
  Filled 2014-06-12 (×6): qty 2

## 2014-06-12 MED ORDER — METOCLOPRAMIDE HCL 5 MG/ML IJ SOLN
5.0000 mg | Freq: Four times a day (QID) | INTRAMUSCULAR | Status: DC
  Administered 2014-06-12 – 2014-06-16 (×16): 5 mg via INTRAVENOUS
  Filled 2014-06-12: qty 1
  Filled 2014-06-12: qty 2
  Filled 2014-06-12 (×7): qty 1
  Filled 2014-06-12: qty 2
  Filled 2014-06-12: qty 1
  Filled 2014-06-12 (×3): qty 2
  Filled 2014-06-12 (×2): qty 1
  Filled 2014-06-12 (×2): qty 2
  Filled 2014-06-12: qty 1

## 2014-06-12 MED ORDER — BUTAMBEN-TETRACAINE-BENZOCAINE 2-2-14 % EX AERO
INHALATION_SPRAY | CUTANEOUS | Status: DC | PRN
  Administered 2014-06-12: 2 via TOPICAL

## 2014-06-12 MED ORDER — LORAZEPAM 2 MG/ML IJ SOLN
0.0000 mg | Freq: Four times a day (QID) | INTRAMUSCULAR | Status: AC
  Administered 2014-06-12 – 2014-06-13 (×3): 2 mg via INTRAVENOUS
  Administered 2014-06-13: 1 mg via INTRAVENOUS
  Filled 2014-06-12 (×4): qty 1

## 2014-06-12 MED ORDER — DIPHENHYDRAMINE HCL 50 MG/ML IJ SOLN
INTRAMUSCULAR | Status: AC
  Filled 2014-06-12: qty 1

## 2014-06-12 MED ORDER — ALUM & MAG HYDROXIDE-SIMETH 200-200-20 MG/5ML PO SUSP: 30.0000 mL | Freq: Four times a day (QID) | ORAL | Status: DC | PRN

## 2014-06-12 MED ORDER — SODIUM CHLORIDE 0.9 % IV SOLN
80.0000 mg | Freq: Once | INTRAVENOUS | Status: AC
  Administered 2014-06-12: 80 mg via INTRAVENOUS
  Filled 2014-06-12: qty 80

## 2014-06-12 MED ORDER — OCTREOTIDE LOAD VIA INFUSION
50.0000 ug | Freq: Once | INTRAVENOUS | Status: AC
  Administered 2014-06-12: 50 ug via INTRAVENOUS
  Filled 2014-06-12: qty 25

## 2014-06-12 MED ORDER — PANTOPRAZOLE SODIUM 40 MG IV SOLR
40.0000 mg | INTRAVENOUS | Status: DC
  Administered 2014-06-12 – 2014-06-15 (×4): 40 mg via INTRAVENOUS
  Filled 2014-06-12 (×5): qty 40

## 2014-06-12 MED ORDER — SODIUM CHLORIDE 0.9 % IJ SOLN
3.0000 mL | Freq: Two times a day (BID) | INTRAMUSCULAR | Status: DC
  Administered 2014-06-12 – 2014-06-16 (×5): 3 mL via INTRAVENOUS

## 2014-06-12 MED ORDER — ONDANSETRON HCL 4 MG/2ML IJ SOLN
4.0000 mg | Freq: Four times a day (QID) | INTRAMUSCULAR | Status: DC | PRN
  Administered 2014-06-12: 4 mg via INTRAVENOUS
  Filled 2014-06-12: qty 2

## 2014-06-12 MED ORDER — DIPHENHYDRAMINE HCL 50 MG/ML IJ SOLN
INTRAMUSCULAR | Status: DC | PRN
  Administered 2014-06-12 (×2): 25 mg via INTRAVENOUS

## 2014-06-12 MED ORDER — PANTOPRAZOLE SODIUM 40 MG IV SOLR
40.0000 mg | Freq: Two times a day (BID) | INTRAVENOUS | Status: DC
  Administered 2014-06-12: 40 mg via INTRAVENOUS
  Filled 2014-06-12: qty 40

## 2014-06-12 MED ORDER — LORAZEPAM 1 MG PO TABS: 0.0000 mg | ORAL_TABLET | Freq: Two times a day (BID) | ORAL | Status: DC

## 2014-06-12 MED ORDER — FENTANYL CITRATE 0.05 MG/ML IJ SOLN
INTRAMUSCULAR | Status: DC | PRN
  Administered 2014-06-12 (×5): 25 ug via INTRAVENOUS

## 2014-06-12 MED ORDER — VITAMIN B-1 100 MG PO TABS
100.0000 mg | ORAL_TABLET | Freq: Every day | ORAL | Status: DC
  Administered 2014-06-13 – 2014-06-16 (×4): 100 mg via ORAL
  Filled 2014-06-12 (×4): qty 1

## 2014-06-12 MED ORDER — CHLORHEXIDINE GLUCONATE 0.12 % MT SOLN
15.0000 mL | Freq: Two times a day (BID) | OROMUCOSAL | Status: DC
  Administered 2014-06-12 – 2014-06-16 (×7): 15 mL via OROMUCOSAL
  Filled 2014-06-12 (×10): qty 15

## 2014-06-12 MED ORDER — LORAZEPAM 1 MG PO TABS: 0.0000 mg | ORAL_TABLET | Freq: Four times a day (QID) | ORAL | Status: DC

## 2014-06-12 MED ORDER — LORAZEPAM 2 MG/ML IJ SOLN: 0.0000 mg | Freq: Four times a day (QID) | INTRAMUSCULAR | Status: DC

## 2014-06-12 MED ORDER — SODIUM CHLORIDE 0.9 % IV SOLN: INTRAVENOUS | Status: DC

## 2014-06-12 MED ORDER — LORAZEPAM 2 MG/ML IJ SOLN: 0.0000 mg | Freq: Two times a day (BID) | INTRAMUSCULAR | Status: DC

## 2014-06-12 MED ORDER — LORAZEPAM 1 MG PO TABS: 1.0000 mg | ORAL_TABLET | Freq: Four times a day (QID) | ORAL | Status: AC | PRN

## 2014-06-12 MED ORDER — LORAZEPAM 2 MG/ML IJ SOLN: 0.0000 mg | Freq: Two times a day (BID) | INTRAMUSCULAR | Status: AC

## 2014-06-12 MED ORDER — MORPHINE SULFATE 2 MG/ML IJ SOLN
2.0000 mg | INTRAMUSCULAR | Status: DC | PRN
  Filled 2014-06-12: qty 1

## 2014-06-12 MED ORDER — THIAMINE HCL 100 MG/ML IJ SOLN
100.0000 mg | Freq: Every day | INTRAMUSCULAR | Status: DC
  Administered 2014-06-12: 100 mg via INTRAVENOUS
  Filled 2014-06-12 (×2): qty 1
  Filled 2014-06-12: qty 2
  Filled 2014-06-12: qty 1
  Filled 2014-06-12: qty 2

## 2014-06-12 MED ORDER — ONDANSETRON HCL 4 MG PO TABS: 4.0000 mg | ORAL_TABLET | Freq: Four times a day (QID) | ORAL | Status: DC | PRN

## 2014-06-12 MED ORDER — ADULT MULTIVITAMIN W/MINERALS CH
1.0000 | ORAL_TABLET | Freq: Every day | ORAL | Status: DC
  Administered 2014-06-13 – 2014-06-16 (×4): 1 via ORAL
  Filled 2014-06-12 (×4): qty 1

## 2014-06-12 MED ORDER — CHLORDIAZEPOXIDE HCL 10 MG PO CAPS
10.0000 mg | ORAL_CAPSULE | Freq: Three times a day (TID) | ORAL | Status: DC
  Administered 2014-06-12 – 2014-06-16 (×11): 10 mg via ORAL
  Filled 2014-06-12 (×11): qty 1

## 2014-06-12 MED ORDER — ADULT MULTIVITAMIN W/MINERALS CH: 1.0000 | ORAL_TABLET | Freq: Every day | ORAL | Status: DC

## 2014-06-12 MED ORDER — SODIUM CHLORIDE 0.9 % IV SOLN
8.0000 mg/h | INTRAVENOUS | Status: DC
  Administered 2014-06-12: 8 mg/h via INTRAVENOUS
  Filled 2014-06-12 (×4): qty 80

## 2014-06-12 MED ORDER — THIAMINE HCL 100 MG/ML IJ SOLN: 100.0000 mg | Freq: Every day | INTRAMUSCULAR | Status: DC

## 2014-06-12 MED ORDER — BIOTENE DRY MOUTH MT LIQD
15.0000 mL | Freq: Two times a day (BID) | OROMUCOSAL | Status: DC
  Administered 2014-06-12 – 2014-06-16 (×9): 15 mL via OROMUCOSAL

## 2014-06-12 NOTE — ED Provider Notes (Signed)
Medical screening examination/treatment/procedure(s) were performed by non-physician practitioner and as supervising physician I was immediately available for consultation/collaboration.   EKG Interpretation None       Kalman Drape, MD 06/12/14 [phone]

## 2014-06-12 NOTE — Progress Notes (Signed)
Two episodes of red vomit with blood clots between 0700 and 1 pm.  Notified Alonza Bogus, PA, for GI.

## 2014-06-12 NOTE — Progress Notes (Signed)
Patient ID: Sara [name], female   DOB: 1976/08/08, 38 y.o.   MRN: [id number]  [facility] PROGRESS NOTE  Sara [name] ELF:[id number] DOB: [birth year] DOA: 06/11/2014 PCP: No PCP Per Patient  Brief narrative: 38 y.o. female, history of ongoing alcohol abuse counseled to quit, alcoholic cirrhosis with evidence of portal hypertension on recent CT scan, history of gastritis with upper GI bleed noted on EGD in 2012, chronic pancreatitis, portal hypertension induced thrombocytopenia, anxiety and depression, GERD who quit alcohol but started drinking again a few months ago presented to the ER after being on alcoholic binge for the last 2 days consuming multiple bottles of wine. She started to experience epigastric abdominal pain radiating to her back about 36 hours ago, she also started to experience nausea and vomiting with 2 episodes of emesis last one having some blood mixed in her gastric contents, she in fact never had chest pain pain is mostly epigastric and some heartburn after her emesis.   Principal Problem:   UGI bleed - still with vomiting this AM - GI consulted and will follow upon recommendations - continue to keep NPO, continue protonix drip  Active Problems:   Anxiety state, unspecified - clinically stable this AM   Cirrhosis with alcoholism - will provide alcohol cessation counseling once pt is more medically stable   GERD (gastroesophageal reflux disease) - Protonix drip for now    Portal hypertension - octreotide infusion    Acute on chronic blood loss anemia - secondary to vomiting and alcoholic gastritis most likely - follow upon GI recommendations - no indication for transfusion at this time - repeat CBC in AM   Pancreatitis, alcoholic - symptomatic management, analgesia and antiemetics as needed - repeat lipase in AM   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  No acute cardiopulmonary process seen.     Antibiotics:  None   Code Status: Full Family Communication: Pt at bedside Disposition Plan: Remains inpatient   HPI/Subjective: No events overnight.   Objective: Filed Vitals:   06/12/14 0948 06/12/14 1000 06/12/14 1100 06/12/14 1145  BP:  122/74 122/67   Pulse: 100 96 93   Temp:    98.2 F (36.8 C)  TempSrc:    Oral  Resp: 19 22 16    Height:      Weight:      SpO2: 95% 92% 96%     Intake/Output Summary (Last 24 hours) at 06/12/14 1225 Last data filed at 06/12/14 1100  Gross per 24 hour  Intake 842.08 ml  Output     45 ml  Net 797.08 ml    Exam:   General:  Pt is alert, follows commands appropriately, not in acute distress  Cardiovascular: Regular rate and rhythm, S1/S2, no murmurs, no rubs, no gallops  Respiratory: Clear to auscultation bilaterally, no wheezing, no crackles, no rhonchi  Abdomen: Soft, tender in epigastric area, non distended, bowel sounds present, no guarding  Data Reviewed: Basic Metabolic Panel:  Recent Labs Lab 06/11/14 2218 06/12/14 0530  NA 140 144  K 4.3 3.8  CL 100 105  CO2 24 26  GLUCOSE 139* 102*  BUN 5* 8  CREATININE  0.55 0.52  CALCIUM 8.9 8.2*   Liver Function Tests:  Recent Labs Lab 06/11/14 2314  AST 190*  ALT 53*  ALKPHOS 137*  BILITOT 1.3*  PROT 8.5*  ALBUMIN 3.5    Recent Labs Lab 06/12/14 0048  LIPASE 150*   CBC:  Recent Labs Lab 06/11/14 2218 06/12/14 0047 06/12/14 0530 06/12/14 0810  WBC 6.1  --  3.7*  --   HGB 11.6* 10.8* 9.8* 10.6*  HCT 35.9* 33.2* 30.2* 32.1*  MCV 96.2  --  97.1  --   PLT 59*  --  48*  --      Recent Results (from the past 240 hour(s))  MRSA PCR SCREENING     Status: None   Collection Time     06/12/14  2:26 AM      Result Value Ref Range Status   MRSA by PCR NEGATIVE  NEGATIVE Final   Comment:            The GeneXpert MRSA Assay (FDA     approved for NASAL specimens     only), is one component of a     comprehensive MRSA colonization     surveillance program. It is not     intended to diagnose MRSA     infection nor to guide or     monitor treatment for     MRSA infections.     Scheduled Meds: . chlordiazePOXIDE  10 mg Oral TID  . folic acid  1 mg Oral Daily  . LORazepam  0-4 mg Intravenous Q6H   Followed by  . [START ON 06/14/2014] LORazepam  0-4 mg Intravenous Q12H  . multivitamin with minerals  1 tablet Oral Daily  . octreotide  50 mcg Intravenous Once  . sodium chloride  3 mL Intravenous Q12H  . thiamine  100 mg Oral Daily   Or  . thiamine  100 mg Intravenous Daily   Continuous Infusions: . sodium chloride 100 mL/hr at 06/12/14 0121  . octreotide (SANDOSTATIN) infusion    . pantoprozole (PROTONIX) infusion 8 mg/hr (06/12/14 1100)   Faye Ramsay, MD  [facility] Pager [phone]  If 7PM-7AM, please contact night-coverage [email or web address] Password [facility] 06/12/2014, 12:25 PM   LOS: 1 day

## 2014-06-12 NOTE — Progress Notes (Signed)
Vanleer, RN, BSN, CCM  [phone]  Chart Reviewed for discharge and hospital needs.  Discharge needs at time of review: None present will follow for needs.  Review of patient progress due on [id number].

## 2014-06-12 NOTE — Consult Note (Signed)
Referring Provider: No ref. provider found Primary Care Physician:  No PCP Per Patient Primary Gastroenterologist:  Dr. Deatra Ina  Reason for Consultation:  GI bleed  HPI: Sara [name] is a 38 y.o. female with history of ongoing alcohol abuse, alcoholic cirrhosis with evidence of portal hypertension on CT scan last year, anxiety and depression, and GERD.  She says that she went through 2 months of treatment but started drinking again about one month ago.  She presented to the ER last night after being on alcoholic binge for the last 2 days consuming multiple bottles of wine. She started to experience epigastric abdominal pain radiating to her back, as well as nausea and vomiting with 2 episodes of emesis last one having some blood mixed in her gastric contents.  Hgb has been stable with 10.6 gram Hgb this morning.  BUN not elevated.  Lipase slightly elevated at 150.  INR 1.25.  FOBT was negative in the ED.  Platelets 48.  She was placed on PPI gtt.  This morning she vomited 45 cc of frothy frank red blood.  Still has some epigastric abdominal pain but describes it as just being sore.  EGD 12/2010 by Dr. Deatra Ina showed only moderate gastritis.   Past Medical History  Diagnosis Date  . Depression   . Pneumonia   . Cirrhosis   . Portal hypertension   . S/P alcohol detoxification     2-3 days at behavioral health previously  . Alcoholism   . Anxiety   . GERD (gastroesophageal reflux disease)     Past Surgical History  Procedure Laterality Date  . Cholecystectomy      Prior to Admission medications   Medication Sig Start Date End Date Taking? Authorizing Provider  folic acid (FOLVITE) 1 MG tablet Take 1 mg by mouth daily.   Yes Historical Provider, MD  ibuprofen (ADVIL,MOTRIN) 800 MG tablet Take 1 tablet (800 mg total) by mouth 3 (three) times daily. 05/10/14  Yes Hannah Muthersbaugh, PA-C  Multiple Vitamin (MULTIVITAMIN WITH MINERALS) TABS tablet Take 1 tablet by mouth daily. For low  vitamin 02/05/14  Yes Encarnacion Slates, NP  pantoprazole (PROTONIX) 40 MG tablet Take 40 mg by mouth daily.   Yes Historical Provider, MD    Current Facility-Administered Medications  Medication Dose Route Frequency Provider Last Rate Last Dose  . 0.9 %  sodium chloride infusion   Intravenous Continuous Thurnell Lose, MD 100 mL/hr at 06/12/14 0121    . alum & mag hydroxide-simeth (MAALOX/MYLANTA) 200-200-20 MG/5ML suspension 30 mL  30 mL Oral Q6H PRN Thurnell Lose, MD      . chlordiazePOXIDE (LIBRIUM) capsule 10 mg  10 mg Oral TID Thurnell Lose, MD      . folic acid (FOLVITE) tablet 1 mg  1 mg Oral Daily Thurnell Lose, MD      . guaiFENesin-dextromethorphan (ROBITUSSIN DM) 100-10 MG/5ML syrup 5 mL  5 mL Oral Q4H PRN Thurnell Lose, MD      . LORazepam (ATIVAN) injection 0-4 mg  0-4 mg Intravenous Q6H Thurnell Lose, MD       Followed by  . [START ON 06/14/2014] LORazepam (ATIVAN) injection 0-4 mg  0-4 mg Intravenous Q12H Thurnell Lose, MD      . LORazepam (ATIVAN) tablet 1 mg  1 mg Oral Q6H PRN Thurnell Lose, MD       Or  . LORazepam (ATIVAN) injection 1 mg  1 mg Intravenous Q6H PRN Prashant K  Candiss Norse, MD      . morphine 2 MG/ML injection 2 mg  2 mg Intravenous Q4H PRN Thurnell Lose, MD      . multivitamin with minerals tablet 1 tablet  1 tablet Oral Daily Thurnell Lose, MD      . ondansetron (ZOFRAN) tablet 4 mg  4 mg Oral Q6H PRN Thurnell Lose, MD       Or  . ondansetron (ZOFRAN) injection 4 mg  4 mg Intravenous Q6H PRN Thurnell Lose, MD   4 mg at 06/12/14 0921  . pantoprazole (PROTONIX) 80 mg in sodium chloride 0.9 % 250 mL infusion  8 mg/hr Intravenous Continuous Thurnell Lose, MD 25 mL/hr at 06/12/14 0900 8 mg/hr at 06/12/14 0900  . polyethylene glycol (MIRALAX / GLYCOLAX) packet 17 g  17 g Oral Daily PRN Thurnell Lose, MD      . sodium chloride 0.9 % injection 3 mL  3 mL Intravenous Q12H Thurnell Lose, MD   3 mL at 06/12/14 0924  . thiamine (VITAMIN  B-1) tablet 100 mg  100 mg Oral Daily Thurnell Lose, MD       Or  . thiamine (B-1) injection 100 mg  100 mg Intravenous Daily Thurnell Lose, MD   100 mg at 06/12/14 0920    Allergies as of 06/11/2014 - Review Complete 06/11/2014  Allergen Reaction Noted  . Morphine and related  03/11/2013  . Morphine and related Other (See Comments) 04/22/2014    Family History  Problem Relation Age of Onset  . Colon polyps Mother   . Hypertension Mother   . Thyroid disease Mother   . Alcoholism Mother   . Alcoholism Father   . Alcohol abuse Maternal Grandfather   . Alcohol abuse Paternal Grandfather   . Alcohol abuse Paternal Aunt   . Alcohol abuse Maternal Uncle     History   Social History  . Marital Status: Married    Spouse Name: N/A    Number of Children: N/A  . Years of Education: N/A   Occupational History  . paralegal    Social History Main Topics  . Smoking status: Never Smoker   . Smokeless tobacco: Never Used  . Alcohol Use: 42.0 oz/week    70 Glasses of wine per week     Comment: reports 3 bottle of wine and pint of or more of vodka daily. 2 bottles of wine currently and pint of vodka some days for at least 4-5 days.a month.    . Drug Use: Yes    Special: Cocaine, Marijuana   Social History Narrative   ** Merged History Encounter **       Lives with husband and 2 children. Works at a [employer].    Review of Systems: Ten point ROS is O/W negative except as mentioned in HPI.  Physical Exam: Vital signs in last 24 hours: Temp:  [97.7 F (36.5 C)-98.6 F (37 C)] 98.2 F (36.8 C) (07/23 0750) Pulse Rate:  [74-148] 100 (07/23 0948) Resp:  [13-24] 19 (07/23 0948) BP: (103-135)/(59-79) 135/79 mmHg (07/23 0943) SpO2:  [89 %-99 %] 95 % (07/23 0948) Weight:  [170 lb (77.111 kg)] 170 lb (77.111 kg) (07/22 2156)   General:  Alert, Well-developed, well-nourished, pleasant and cooperative in NAD; tearful and anxious Head:  Normocephalic and atraumatic. Eyes:   Sclera clear, no icterus.   Conjunctiva pink. Ears:  Normal auditory acuity. Mouth:  No deformity or lesions.  Lungs:  Clear throughout to auscultation.   No wheezes, crackles, or rhonchi.  Heart:  Regular rate and rhythm; no murmurs, clicks, rubs, or gallops. Abdomen:  Soft, non-distended.  BS present.  Mild epigastric TTP without R/R/G.   Rectal:  Deferred.  Heme negative upon arrival.  Msk:  Symmetrical without gross deformities. Pulses:  Normal pulses noted. Extremities:  Without clubbing or edema. Neurologic:  Alert and oriented x4;  grossly normal neurologically. Skin:  Intact without significant lesions or rashes. Psych:  Alert and cooperative.  Tearful.  Intake/Output from previous day: 07/22 0701 - 07/23 0700 In: 442.1 [I.V.:442.1] Out: -  Intake/Output this shift: Total I/O In: 250 [I.V.:250] Out: 45 [Emesis/NG output:45]  Lab Results:  Recent Labs  06/11/14 2218 06/12/14 0047 06/12/14 0530 06/12/14 0810  WBC 6.1  --  3.7*  --   HGB 11.6* 10.8* 9.8* 10.6*  HCT 35.9* 33.2* 30.2* 32.1*  PLT 59*  --  48*  --    BMET  Recent Labs  06/11/14 2218 06/12/14 0530  NA 140 144  K 4.3 3.8  CL 100 105  CO2 24 26  GLUCOSE 139* 102*  BUN 5* 8  CREATININE 0.55 0.52  CALCIUM 8.9 8.2*   LFT  Recent Labs  06/11/14 2314  PROT 8.5*  ALBUMIN 3.5  AST 190*  ALT 53*  ALKPHOS 137*  BILITOT 1.3*  BILIDIR 0.6*  IBILI 0.7   PT/INR  Recent Labs  06/12/14 0047  LABPROT 15.7*  INR 1.25   Studies/Results: Dg Chest Port 1 View  06/11/2014   CLINICAL DATA:  Chest pain, radiating to the back.  EXAM: PORTABLE CHEST - 1 VIEW  COMPARISON:  Chest radiograph performed 10/08/2013  FINDINGS: The lungs are well-aerated. Pulmonary vascularity is at the upper limits of normal. There is no evidence of focal opacification, pleural effusion or pneumothorax.  The cardiomediastinal silhouette is borderline enlarged. No acute osseous abnormalities are seen.  IMPRESSION: Borderline  cardiomegaly.  No acute cardiopulmonary process seen.   Electronically Signed   By: Garald Balding M.D.   On: 06/11/2014 22:40    IMPRESSION/PLAN:  -Hematemesis:  Vomited 45 cc of frank red blood this AM.  Rule out esophageal varices vs MWT from recent retching/vomiting at home PTA vs ulcer disease vs portal hypertensive gastropathy, etc.  She is on PPI gtt and I will start octreotide gtt for now as well.  EGD later today.  Monitor Hgb. -ETOH cirrhosis:  With thrombocytopenia and splenomegaly/portosystemic collateral pathways indicating portal HTN on imaging last year.  LFT's elevated in ETOH hepatitis pattern. -ETOH abuse   ZEHR, JESSICA D.  06/12/2014, 10:25 AM  Pager number [phone]  [facility] Attending  I have also seen and assessed the patient and agree with the above note. Seriously ill with alcoholic liver disease, hematemesis. Will evaluate with egd  Gatha Mayer, MD, [facility] [phone] (pager) 06/12/2014 4:22 PM

## 2014-06-12 NOTE — Brief Op Note (Signed)
06/11/2014 - 06/12/2014  4:41 PM  PATIENT:  Sara [name]  38 y.o. female  PRE-OPERATIVE DIAGNOSIS:  Upper GI bleeding  POST-OPERATIVE DIAGNOSIS:  Esophageal varices with bleeding  PROCEDURE:  Procedure(s): ESOPHAGOGASTRODUODENOSCOPY (EGD) (N/A)  SURGEON:  Surgeon(s) and Role:    * Gatha Mayer, MD - Primary  ANESTHESIA:   IV sedation fentanyl 125 mcg, Versed 12 mg, Benadryl 50 mg IV  EBL:  Total I/O In: 1050 [I.V.:1050] Out: 61 [Emesis/NG output:45]   LOCAL MEDICATIONS USED:  Topical cetacaine  1) 2 columns of esophageal varices with fresh blood in the esophagus and stomach 2) limited views of stomach and duodenum due to blood and retching - sedation not very effective  3) 4 bands placed on esophageal varices   Plan:  1) Octreotide 2) dc pantoprazole infusion 3)  IV ceftriaxone  4) running anti-emetic 5) NPO except sips

## 2014-06-12 NOTE — H&P (Signed)
Patient Demographics  Sara [name], is a 38 y.o. female  MRN: [id number]   DOB - Sep 16, 1976  Admit Date - 06/11/2014  Outpatient Primary MD for the patient is No PCP Per Patient   With History of -  Past Medical History  Diagnosis Date  . Depression   . Pneumonia   . Cirrhosis   . Portal hypertension   . S/P alcohol detoxification     2-3 days at behavioral health previously  . Alcoholism   . Anxiety   . GERD (gastroesophageal reflux disease)       Past Surgical History  Procedure Laterality Date  . Cholecystectomy      in for   Chief Complaint  Patient presents with  . Alcohol Intoxication  . Chest Pain     HPI  Sara [name]  is a 38 y.o. female, history of ongoing alcohol abuse counseled to quit, alcoholic cirrhosis with evidence of portal hypertension on recent CT scan, history of gastritis with upper GI bleed noted on EGD in 2012, chronic pancreatitis, portal hypertension induced thrombocytopenia, anxiety and depression, GERD who quit alcohol but started drinking again a few months ago presents to the ER after being on alcoholic binge for the last 2 days consuming multiple bottles of wine. She started to experience epigastric abdominal pain radiating to her back about 36 hours ago, she also started to experience nausea and vomiting with 2 episodes of emesis last one having some blood mixed in her gastric contents, she in fact never had chest pain pain is mostly epigastric and some heartburn after her emesis.   Came to the ER where workup was consistent with possible upper GI bleed, patient also wished to get detoxed, I was called to admit the patient. Patient currently having mild epigastric pain radiating to her back, worse by eating food better with bowel rest, associated with nausea vomiting, last  emesis about 20 hours ago, denies any palpitations cough phlegm shortness of breath, no headache fever chills, no blood in stool or urine, no dysuria. She does have some spontaneous bruising from time to time, no focal weakness.    Review of Systems    In addition to the HPI above,   No Fever-chills, No Headache, No changes with Vision or hearing, No problems swallowing food or Liquids, No Chest pain, Cough or Shortness of Breath, + Abdominal pain, + Nausea & Vommitting, Bowel movements are regular, No Blood in stool or Urine, No dysuria, No new skin rashes . + bruises, No new joints pains-aches,  No new weakness, tingling, numbness in any extremity, No recent weight gain or loss, No polyuria, polydypsia or polyphagia, No significant Mental Stressors.  A full 10 point Review of Systems was done, except as stated above, all other Review of Systems were negative.   Social History History  Substance Use Topics  . Smoking status: Never Smoker   . Smokeless tobacco: Never Used  . Alcohol Use: 42.0  oz/week    70 Glasses of wine per week     Comment: reports 3 bottle of wine and pint of or more of vodka daily. 2 bottles of wine currently and pint of vodka some days for at least 4-5 days.a month.        Family History Family History  Problem Relation Age of Onset  . Colon polyps Mother   . Hypertension Mother   . Thyroid disease Mother   . Alcoholism Mother   . Alcoholism Father   . Alcohol abuse Maternal Grandfather   . Alcohol abuse Paternal Grandfather   . Alcohol abuse Paternal Aunt   . Alcohol abuse Maternal Uncle       Prior to Admission medications   Medication Sig Start Date End Date Taking? Authorizing Provider  folic acid (FOLVITE) 1 MG tablet Take 1 mg by mouth daily.   Yes Historical Provider, MD  ibuprofen (ADVIL,MOTRIN) 800 MG tablet Take 1 tablet (800 mg total) by mouth 3 (three) times daily. 05/10/14  Yes Hannah Muthersbaugh, PA-C  Multiple Vitamin  (MULTIVITAMIN WITH MINERALS) TABS tablet Take 1 tablet by mouth daily. For low vitamin 02/05/14  Yes Encarnacion Slates, NP  pantoprazole (PROTONIX) 40 MG tablet Take 40 mg by mouth daily.   Yes Historical Provider, MD    Allergies  Allergen Reactions  . Morphine And Related     Slowed HR, lowered BP  . Morphine And Related Other (See Comments)    hypotension    Physical Exam  Vitals  Blood pressure 103/59, pulse 90, temperature 97.7 F (36.5 C), temperature source Oral, resp. rate 18, height 5\' 4"  (1.626 m), weight 77.111 kg (170 lb), SpO2 94.00%.   1. General middle-aged obese Hispanic female lying in bed in NAD,     2. Normal affect and insight, Not Suicidal or Homicidal, Awake Alert, Oriented X 3.  3. No F.N deficits, ALL C.Nerves Intact, Strength 5/5 all 4 extremities, Sensation intact all 4 extremities, Plantars down going.  4. Ears and Eyes appear Normal, Conjunctivae clear, PERRLA. Moist Oral Mucosa.  5. Supple Neck, No JVD, No cervical lymphadenopathy appriciated, No Carotid Bruits.  6. Symmetrical Chest wall movement, Good air movement bilaterally, CTAB.  7. RRR, No Gallops, Rubs or Murmurs, No Parasternal Heave.  8. Positive Bowel Sounds, Abdomen Soft, mild epigastric tenderness, No organomegaly appriciated,No rebound -guarding or rigidity.  9.  No Cyanosis, Normal Skin Turgor, No Skin Rash , few lower extremity bruises noted  10. Good muscle tone,  joints appear normal , no effusions, Normal ROM.  11. No Palpable Lymph Nodes in Neck or Axillae     Data Review  CBC  Recent Labs Lab 06/11/14 2218  WBC 6.1  HGB 11.6*  HCT 35.9*  PLT 59*  MCV 96.2  MCH 31.1  MCHC 32.3  RDW 14.3   ------------------------------------------------------------------------------------------------------------------  Chemistries   Recent Labs Lab 06/11/14 2218 06/11/14 2314  NA 140  --   K 4.3  --   CL 100  --   CO2 24  --   GLUCOSE 139*  --   BUN 5*  --     CREATININE 0.55  --   CALCIUM 8.9  --   AST  --  190*  ALT  --  53*  ALKPHOS  --  137*  BILITOT  --  1.3*   ------------------------------------------------------------------------------------------------------------------ estimated creatinine clearance is 96.8 ml/min (by C-G formula based on Cr of 0.55). ------------------------------------------------------------------------------------------------------------------ No results found for this basename: TSH,  Cherlynn Perches, T3FREE, THYROIDAB,  in the last 72 hours  Results for LATANGELA, MCCOMAS (MRN [id number]) as of 06/12/2014 00:50  Ref. Range 06/11/2014 22:27  Troponin i, poc Latest Range: 0.00-0.08 ng/mL 0.00    Coagulation profile No results found for this basename: INR, PROTIME,  in the last 168 hours ------------------------------------------------------------------------------------------------------------------- No results found for this basename: DDIMER,  in the last 72 hours -------------------------------------------------------------------------------------------------------------------  Cardiac Enzymes No results found for this basename: CK, CKMB, TROPONINI, MYOGLOBIN,  in the last 168 hours ------------------------------------------------------------------------------------------------------------------ No components found with this basename: POCBNP,    ---------------------------------------------------------------------------------------------------------------  Urinalysis    Component Value Date/Time   COLORURINE YELLOW 06/11/2014 2248   APPEARANCEUR CLEAR 06/11/2014 2248   LABSPEC 1.005 06/11/2014 2248   PHURINE 6.5 06/11/2014 2248   GLUCOSEU NEGATIVE 06/11/2014 2248   HGBUR NEGATIVE 06/11/2014 2248   BILIRUBINUR NEGATIVE 06/11/2014 2248   KETONESUR NEGATIVE 06/11/2014 2248   PROTEINUR NEGATIVE 06/11/2014 2248   UROBILINOGEN 1.0 06/11/2014 2248   NITRITE NEGATIVE 06/11/2014 2248   LEUKOCYTESUR NEGATIVE 06/11/2014 2248     ----------------------------------------------------------------------------------------------------------------  Imaging results:   Dg Chest Port 1 View  06/11/2014   CLINICAL DATA:  Chest pain, radiating to the back.  EXAM: PORTABLE CHEST - 1 VIEW  COMPARISON:  Chest radiograph performed 10/08/2013  FINDINGS: The lungs are well-aerated. Pulmonary vascularity is at the upper limits of normal. There is no evidence of focal opacification, pleural effusion or pneumothorax.  The cardiomediastinal silhouette is borderline enlarged. No acute osseous abnormalities are seen.  IMPRESSION: Borderline cardiomegaly.  No acute cardiopulmonary process seen.   Electronically Signed   By: Garald Balding M.D.   On: 06/11/2014 22:40    My personal review of EKG: Rhythm NSR, Rate  88 /min,  no Acute ST changes    Assessment & Plan  1. Epigastric abdominal pain associated with nausea vomiting and upper GI bleed. She appears fairly stable at this point, however she will require close monitoring in step down unit as there is potential of upper GI bleed and severe DTs.  Will put her in step down, n.p.o. except medications, H&H currently stable will monitor H&H, type and screen, IV PPI drip as she has history of gastritis, her upper GI bleed could be due to gastritis versus Mallory-Weiss tear, there is evidence of portal hypertension on CT scan which was done one year ago however EGD from 2012 did not reveal any esophageal varices, [facility] will be informed by ER they have scoped the patient in the past. Will check baseline INR.  I will check a lipase as there is a strong possibility that her pain is coming from alcoholic pancreatitis versus gastritis. Last emesis was around 20 hours ago. Currently stable from nausea vomiting and upper GI standpoint.    2. Alcohol abuse with currently high alcohol levels. Counseled quit, placed on CIWA protocol, will place on scheduled Librium, folic acid thymine per  protocol.    3. Alcoholic cirrhosis with history of portal hypertension & thrombocytopenia. Avoid antiplatelet agents and blood thinners, there is some spontaneous bruising intermittently over the last several years but nothing acute. Monitor CBC closely. GI to follow.    4. Anxiety and depression. No acute issues currently not suicidal homicidal. Can follow with PCP and primary psychiatrist post discharge.     DVT Prophylaxis  SCDs    AM Labs Ordered, also please review Full Orders  Family Communication: Admission, patients condition and plan of care including tests being ordered have been  discussed with the patient who indicates understanding and agree with the plan and Code Status.  Code Status Full  Likely DC to  TBD  Condition GUARDED    Time spent in minutes : 35    Rakan Soffer K M.D on 06/12/2014 at 12:49 AM  Between 7am to 7pm - Pager - [phone]  After 7pm go to [email or web address] - password [email or web address]  And look for the night coverage person covering me after hours  [facility] Office  [phone]   **Disclaimer: This note may have been dictated with voice recognition software. Similar sounding words can inadvertently be transcribed and this note may contain transcription errors which may not have been corrected upon publication of note.**

## 2014-06-13 ENCOUNTER — Encounter (HOSPITAL_COMMUNITY): Payer: Self-pay | Admitting: Internal Medicine

## 2014-06-13 DIAGNOSIS — I8501 Esophageal varices with bleeding: Principal | ICD-10-CM

## 2014-06-13 HISTORY — DX: Esophageal varices with bleeding: I85.01

## 2014-06-13 LAB — COMPREHENSIVE METABOLIC PANEL
ALK PHOS: 114 U/L (ref 39–117)
ALT: 42 U/L — ABNORMAL HIGH (ref 0–35)
ANION GAP: 12 (ref 5–15)
AST: 146 U/L — ABNORMAL HIGH (ref 0–37)
Albumin: 3.2 g/dL — ABNORMAL LOW (ref 3.5–5.2)
BILIRUBIN TOTAL: 2.3 mg/dL — AB (ref 0.3–1.2)
BUN: 6 mg/dL (ref 6–23)
CHLORIDE: 98 meq/L (ref 96–112)
CO2: 26 meq/L (ref 19–32)
Calcium: 8.3 mg/dL — ABNORMAL LOW (ref 8.4–10.5)
Creatinine, Ser: 0.47 mg/dL — ABNORMAL LOW (ref 0.50–1.10)
GLUCOSE: 101 mg/dL — AB (ref 70–99)
POTASSIUM: 3.8 meq/L (ref 3.7–5.3)
Sodium: 136 mEq/L — ABNORMAL LOW (ref 137–147)
Total Protein: 7.5 g/dL (ref 6.0–8.3)

## 2014-06-13 LAB — URINE CULTURE
Colony Count: NO GROWTH
Culture: NO GROWTH

## 2014-06-13 LAB — CBC
HCT: 31.3 % — ABNORMAL LOW (ref 36.0–46.0)
Hemoglobin: 9.9 g/dL — ABNORMAL LOW (ref 12.0–15.0)
MCH: 30.9 pg (ref 26.0–34.0)
MCHC: 31.6 g/dL (ref 30.0–36.0)
MCV: 97.8 fL (ref 78.0–100.0)
Platelets: 40 10*3/uL — ABNORMAL LOW (ref 150–400)
RBC: 3.2 MIL/uL — ABNORMAL LOW (ref 3.87–5.11)
RDW: 14.2 % (ref 11.5–15.5)
WBC: 3 10*3/uL — AB (ref 4.0–10.5)

## 2014-06-13 LAB — LIPASE, BLOOD: Lipase: 82 U/L — ABNORMAL HIGH (ref 11–59)

## 2014-06-13 MED ORDER — LORAZEPAM 2 MG/ML IJ SOLN: 1.0000 mg | Freq: Four times a day (QID) | INTRAMUSCULAR | Status: DC | PRN

## 2014-06-13 MED ORDER — HYDROCODONE-ACETAMINOPHEN 5-325 MG PO TABS
1.0000 | ORAL_TABLET | ORAL | Status: DC | PRN
  Administered 2014-06-13: 1 via ORAL
  Filled 2014-06-13 (×2): qty 1

## 2014-06-13 NOTE — Progress Notes (Signed)
Received report from ED Nurse, Pt arrived unit accompanied by family. Alert and oriented, No distress, will continue with current plan of care.

## 2014-06-13 NOTE — Progress Notes (Signed)
Patient ID: Sara [name], female   DOB: [birth year], 38 y.o.   MRN: [id number]  [facility] PROGRESS NOTE  Langley Flatley ZLD:[id number] DOB: 22-Mar-1976 DOA: 06/11/2014 PCP: No PCP Per Patient  Brief narrative:  38 y.o. female, history of ongoing alcohol abuse counseled to quit, alcoholic cirrhosis with evidence of portal hypertension on recent CT scan, history of gastritis with upper GI bleed noted on EGD in 2012, chronic pancreatitis, portal hypertension induced thrombocytopenia, anxiety and depression, GERD who quit alcohol but started drinking again a few months ago presented to the ER after being on alcoholic binge for the last 2 days consuming multiple bottles of wine. She started to experience epigastric abdominal pain radiating to her back about 36 hours ago, she also started to experience nausea and vomiting with 2 episodes of emesis last one having some blood mixed in her gastric contents, she in fact never had chest pain pain is mostly epigastric and some heartburn after her emesis.   Principal Problem:  UGI bleed  - status post EGD --> 2 columns of esophageal varices with fresh blood in esophagus/stomach, 4 bands placed on esophageal varices  - continue Octreotide x 72 hours - placed on IV Rocephin 7/23, antiemetics as needed - continue Protonix 40 mg BID IV - Will need to be started on nadolol prior to discharge.  - advance diet to soft  Active Problems:  Anxiety state, unspecified  - clinically stable this AM  - transfer to medical floor  Cirrhosis with alcoholism  - will provide alcohol cessation counseling once pt is more medically stable  GERD (gastroesophageal reflux disease)  - Protonix IV BID Portal hypertension  - octreotide infusion x 72 hours  Acute on chronic blood loss anemia  - secondary to esophageal varices  - stable Hg and no sings of active bleeding  - repeat CBC in AM  Pancreatitis, alcoholic  - symptomatic management, analgesia and antiemetics as  needed  - lipase trending down 150 --> 82  Thrombocytopenia  - from alcohol induced bone marrow damage  - close monitoring of CBC  - SCD for DVT prophylaxis  Moderate malnutrition  - from acute illness and choric alcohol abuse  - advance diet as pt able to tolerate  Transaminitis  - from alcohol abuse  - LFT's trending down  - repeat CMET in AM  Consultants:  GI Procedures/Studies:  Dg Chest Port 1 View 06/11/2014 Borderline cardiomegaly. No acute cardiopulmonary process seen.  EGD 06/12/2014  Antibiotics:  None   Code Status: Full  Family Communication: Pt at bedside  Disposition Plan: Transfer to medical   HPI/Subjective: No events overnight.   Objective: Filed Vitals:   06/13/14 0100 06/13/14 0200 06/13/14 0300 06/13/14 0400  BP: 104/53 125/84 126/70 120/81  Pulse: 85 95 90 86  Temp:    98.5 F (36.9 C)  TempSrc:    Oral  Resp: 25 23 27 22   Height:      Weight:    81.8 kg (180 lb 5.4 oz)  SpO2: 93% 95% 93% 95%    Intake/Output Summary (Last 24 hours) at 06/13/14 0743 Last data filed at 06/13/14 0500  Gross per 24 hour  Intake   2200 ml  Output   1245 ml  Net    955 ml    Exam:   General:  Pt is alert, follows commands appropriately, not in acute distress  Cardiovascular: Regular rate and rhythm, S1/S2, no murmurs, no rubs, no gallops  Respiratory: Clear to auscultation bilaterally, no  wheezing, no crackles, no rhonchi  Abdomen: Soft, non tender, non distended, bowel sounds present, no guarding  Extremities: No edema, pulses DP and PT palpable bilaterally  Neuro: Grossly nonfocal  Data Reviewed: Basic Metabolic Panel:  Recent Labs Lab 06/11/14 2218 06/12/14 0530 06/13/14 0040  NA 140 144 136*  K 4.3 3.8 3.8  CL 100 105 98  CO2 24 26 26   GLUCOSE 139* 102* 101*  BUN 5* 8 6  CREATININE 0.55 0.52 0.47*  CALCIUM 8.9 8.2* 8.3*   Liver Function Tests:  Recent Labs Lab 06/11/14 2314 06/13/14 0040  AST 190* 146*  ALT 53* 42*  ALKPHOS  137* 114  BILITOT 1.3* 2.3*  PROT 8.5* 7.5  ALBUMIN 3.5 3.2*    Recent Labs Lab 06/12/14 0048 06/13/14 0040  LIPASE 150* 82*   CBC:  Recent Labs Lab 06/11/14 2218 06/12/14 0047 06/12/14 0530 06/12/14 0810 06/12/14 1211 06/13/14 0040  WBC 6.1  --  3.7*  --   --  3.0*  HGB 11.6* 10.8* 9.8* 10.6* 10.5* 9.9*  HCT 35.9* 33.2* 30.2* 32.1* 32.2* 31.3*  MCV 96.2  --  97.1  --   --  97.8  PLT 59*  --  48*  --   --  40*     Recent Results (from the past 240 hour(s))  URINE CULTURE     Status: None   Collection Time    06/11/14 10:48 PM      Result Value Ref Range Status   Specimen Description URINE, CLEAN CATCH   Final   Special Requests NONE   Final   Culture  Setup Time     Final   Value: 06/12/2014 04:47     Performed at [place]     Final   Value: NO GROWTH     Performed at [facility]   Culture     Final   Value: NO GROWTH     Performed at [facility]   Report Status 06/13/2014 FINAL   Final  MRSA PCR SCREENING     Status: None   Collection Time    06/12/14  2:26 AM      Result Value Ref Range Status   MRSA by PCR NEGATIVE  NEGATIVE Final   Comment:            The GeneXpert MRSA Assay (FDA     approved for NASAL specimens     only), is one component of a     comprehensive MRSA colonization     surveillance program. It is not     intended to diagnose MRSA     infection nor to guide or     monitor treatment for     MRSA infections.     Scheduled Meds: . antiseptic oral rinse  15 mL Mouth Rinse q12n4p  . cefTRIAXone (ROCEPHIN)  IV  2 g Intravenous Q24H  . chlordiazePOXIDE  10 mg Oral TID  . chlorhexidine  15 mL Mouth Rinse BID  . folic acid  1 mg Oral Daily  . LORazepam  0-4 mg Intravenous Q6H   Followed by  . [START ON 06/14/2014] LORazepam  0-4 mg Intravenous Q12H  . metoCLOPramide (REGLAN) injection  5 mg Intravenous 4 times per day  . multivitamin with minerals  1 tablet Oral Daily  . ondansetron  (ZOFRAN) IV  8 mg Intravenous 4 times per day  . pantoprazole (PROTONIX) IV  40 mg Intravenous Q24H  .  sodium chloride  3 mL Intravenous Q12H  . thiamine  100 mg Oral Daily   Or  . thiamine  100 mg Intravenous Daily   Continuous Infusions: . sodium chloride    . sodium chloride    . octreotide (SANDOSTATIN) infusion 25 mcg/hr (06/13/14 0500)    Faye Ramsay, MD  [facility] Pager [phone]  If 7PM-7AM, please contact night-coverage [email or web address] Password [facility] 06/13/2014, 7:43 AM   LOS: 2 days

## 2014-06-13 NOTE — Progress Notes (Signed)
     [facility] Progress Note  Subjective:  No more vomiting blood.  No BM since procedure.  Still with some epigastric abdominal pain.  Objective:  Vital signs in last 24 hours: Temp:  [98.2 F (36.8 C)-98.8 F (37.1 C)] 98.8 F (37.1 C) (07/24 0800) Pulse Rate:  [84-115] 84 (07/24 0800) Resp:  [16-28] 22 (07/24 0800) BP: (97-134)/(49-84) 104/59 mmHg (07/24 0800) SpO2:  [92 %-98 %] 96 % (07/24 0800) Weight:  [180 lb 5.4 oz (81.8 kg)] 180 lb 5.4 oz (81.8 kg) (07/24 0400)   General:  Alert, Well-developed, in NAD Heart:  Regular rate and rhythm; no murmurs Pulm:  CTAB.  No W/R/R. Abdomen:  Soft, non-distended. Normal bowel sounds.  Mild epigastric TTP without R/R/G. Extremities:  Without edema. Neurologic:  Alert and  oriented x4;  grossly normal neurologically. Psych:  Alert and cooperative. Normal mood and affect.  Lab Results:  Recent Labs  06/11/14 2218  06/12/14 0530 06/12/14 0810 06/12/14 1211 06/13/14 0040  WBC 6.1  --  3.7*  --   --  3.0*  HGB 11.6*  < > 9.8* 10.6* 10.5* 9.9*  HCT 35.9*  < > 30.2* 32.1* 32.2* 31.3*  PLT 59*  --  48*  --   --  40*  < > = values in this interval not displayed. BMET  Recent Labs  06/11/14 2218 06/12/14 0530 06/13/14 0040  NA 140 144 136*  K 4.3 3.8 3.8  CL 100 105 98  CO2 24 26 26   GLUCOSE 139* 102* 101*  BUN 5* 8 6  CREATININE 0.55 0.52 0.47*  CALCIUM 8.9 8.2* 8.3*   LFT  Recent Labs  06/11/14 2314 06/13/14 0040  PROT 8.5* 7.5  ALBUMIN 3.5 3.2*  AST 190* 146*  ALT 53* 42*  ALKPHOS 137* 114  BILITOT 1.3* 2.3*  BILIDIR 0.6*  --   IBILI 0.7  --     Assessment / Plan: -Esophageal varices, 2 columns with fresh blood and 4 bands placed during EGD 7/24 -UGI bleeding secondary to above.  Hgb fairly stable this morning. -ETOH cirrhosis: With thrombocytopenia, esophageal varices, splenomegaly (portal HTN).  LFT's elevated in ETOH hepatitis pattern.  -ETOH abuse  *Needs ETOH abstinence. *Continue  octreotide x 72 hours.  Pantoprazole 40 mg IV BID.  IV Rocephin 2 grams daily.  Will need to be started on nadolol prior to discharge. *Monitor labs.  Transfuse prn. *Ok for soft diet today.   LOS: 2 days   ZEHR, JESSICA D.  06/13/2014, 9:49 AM  Pager number [phone]   Agree w/ Ms. Alphia Kava note and plans. Dr. Benson Norway will check her in next 1-2 days.  Gatha Mayer, MD, [facility] [phone] (pager) 06/13/2014 3:32 PM

## 2014-06-14 LAB — COMPREHENSIVE METABOLIC PANEL
ALT: 40 U/L — AB (ref 0–35)
AST: 137 U/L — AB (ref 0–37)
Albumin: 3.3 g/dL — ABNORMAL LOW (ref 3.5–5.2)
Alkaline Phosphatase: 126 U/L — ABNORMAL HIGH (ref 39–117)
Anion gap: 12 (ref 5–15)
BUN: 7 mg/dL (ref 6–23)
CALCIUM: 8.8 mg/dL (ref 8.4–10.5)
CO2: 26 mEq/L (ref 19–32)
Chloride: 96 mEq/L (ref 96–112)
Creatinine, Ser: 0.55 mg/dL (ref 0.50–1.10)
GFR calc Af Amer: >90 mL/min (ref >90)
GFR calc non Af Amer: >90 mL/min (ref >90)
Glucose, Bld: 111 mg/dL — ABNORMAL HIGH (ref 70–99)
Potassium: 3.9 mEq/L (ref 3.7–5.3)
SODIUM: 134 meq/L — AB (ref 137–147)
TOTAL PROTEIN: 8 g/dL (ref 6.0–8.3)
Total Bilirubin: 2.1 mg/dL — ABNORMAL HIGH (ref 0.3–1.2)

## 2014-06-14 LAB — CBC
HCT: 34.7 % — ABNORMAL LOW (ref 36.0–46.0)
Hemoglobin: 11.5 g/dL — ABNORMAL LOW (ref 12.0–15.0)
MCH: 31.8 pg (ref 26.0–34.0)
MCHC: 33.1 g/dL (ref 30.0–36.0)
MCV: 95.9 fL (ref 78.0–100.0)
PLATELETS: 45 10*3/uL — AB (ref 150–400)
RBC: 3.62 MIL/uL — ABNORMAL LOW (ref 3.87–5.11)
RDW: 13.7 % (ref 11.5–15.5)
WBC: 3.6 10*3/uL — ABNORMAL LOW (ref 4.0–10.5)

## 2014-06-14 MED ORDER — ZOLPIDEM TARTRATE 5 MG PO TABS
5.0000 mg | ORAL_TABLET | Freq: Every evening | ORAL | Status: DC | PRN
  Administered 2014-06-15: 5 mg via ORAL
  Filled 2014-06-14: qty 1

## 2014-06-14 MED ORDER — HYDROMORPHONE HCL PF 1 MG/ML IJ SOLN
0.5000 mg | INTRAMUSCULAR | Status: DC | PRN
  Administered 2014-06-14 (×3): 0.5 mg via INTRAVENOUS
  Filled 2014-06-14 (×2): qty 1

## 2014-06-14 NOTE — Progress Notes (Signed)
Subjective: Feeling well.  No complaints.  Objective: Vital signs in last 24 hours: Temp:  [98.6 F (37 C)-99.1 F (37.3 C)] 98.6 F (37 C) (07/25 0516) Pulse Rate:  [83-94] 83 (07/25 0516) Resp:  [16-22] 18 (07/25 0516) BP: (104-124)/(49-79) 124/79 mmHg (07/25 0516) SpO2:  [93 %-100 %] 100 % (07/25 0516) Weight:  [171 lb 3.2 oz (77.656 kg)] 171 lb 3.2 oz (77.656 kg) (07/25 0724)    Intake/Output from previous day: 07/24 0701 - 07/25 0700 In: 370 [P.O.:120; I.V.:150; IV Piggyback:100] Out: 800 [Urine:800] Intake/Output this shift:    General appearance: alert and no distress GI: soft, non-tender; bowel sounds normal; no masses,  no organomegaly  Lab Results:  Recent Labs  06/12/14 0530  06/12/14 1211 06/13/14 0040 06/14/14 0547  WBC 3.7*  --   --  3.0* 3.6*  HGB 9.8*  < > 10.5* 9.9* 11.5*  HCT 30.2*  < > 32.2* 31.3* 34.7*  PLT 48*  --   --  40* 45*  < > = values in this interval not displayed. BMET  Recent Labs  06/12/14 0530 06/13/14 0040 06/14/14 0547  NA 144 136* 134*  K 3.8 3.8 3.9  CL 105 98 96  CO2 26 26 26   GLUCOSE 102* 101* 111*  BUN 8 6 7   CREATININE 0.52 0.47* 0.55  CALCIUM 8.2* 8.3* 8.8   LFT  Recent Labs  06/11/14 2314  06/14/14 0547  PROT 8.5*  < > 8.0  ALBUMIN 3.5  < > 3.3*  AST 190*  < > 137*  ALT 53*  < > 40*  ALKPHOS 137*  < > 126*  BILITOT 1.3*  < > 2.1*  BILIDIR 0.6*  --   --   IBILI 0.7  --   --   < > = values in this interval not displayed. PT/INR  Recent Labs  06/12/14 0047  LABPROT 15.7*  INR 1.25   Hepatitis Panel No results found for this basename: HEPBSAG, HCVAB, HEPAIGM, HEPBIGM,  in the last 72 hours C-Diff No results found for this basename: CDIFFTOX,  in the last 72 hours Fecal Lactopherrin No results found for this basename: FECLLACTOFRN,  in the last 72 hours  Studies/Results: No results found.  Medications:  Scheduled: . antiseptic oral rinse  15 mL Mouth Rinse q12n4p  . cefTRIAXone (ROCEPHIN)   IV  2 g Intravenous Q24H  . chlordiazePOXIDE  10 mg Oral TID  . chlorhexidine  15 mL Mouth Rinse BID  . folic acid  1 mg Oral Daily  . LORazepam  0-4 mg Intravenous Q12H  . metoCLOPramide (REGLAN) injection  5 mg Intravenous 4 times per day  . multivitamin with minerals  1 tablet Oral Daily  . ondansetron (ZOFRAN) IV  8 mg Intravenous 4 times per day  . pantoprazole (PROTONIX) IV  40 mg Intravenous Q24H  . sodium chloride  3 mL Intravenous Q12H  . thiamine  100 mg Oral Daily   Or  . thiamine  100 mg Intravenous Daily   Continuous: . octreotide (SANDOSTATIN) infusion 25 mcg/hr (06/14/14 5643)    Assessment/Plan: 1) S/p esophageal variceal bleed. 2) ETOH cirrhosis.   She is stable.  Complains of a tight sensation in her chest, which is expected from the banding.  She underwent four bands for bleeding esophageal varices.   Plan: 1) Continue with ceftriaxone and octreotide.  2) Monitor for withdrawal. 3) Follow HGB.  LOS: 3 days   Sara [name] 06/14/2014, 7:42 AM

## 2014-06-14 NOTE — Progress Notes (Signed)
Patient ID: Sara [name], female   DOB: [birth year], 38 y.o.   MRN: [id number]  [facility] PROGRESS NOTE  Sara [name] VXB:[id number] DOB: Feb 11, 1976 DOA: 06/11/2014 PCP: No PCP Per Patient  Brief narrative:  38 y.o. female, history of ongoing alcohol abuse counseled to quit, alcoholic cirrhosis with evidence of portal hypertension on recent CT scan, history of gastritis with upper GI bleed noted on EGD in 2012, chronic pancreatitis, portal hypertension induced thrombocytopenia, anxiety and depression, GERD who quit alcohol but started drinking again a few months ago presented to the ER after being on alcoholic binge for the last 2 days consuming multiple bottles of wine. She started to experience epigastric abdominal pain radiating to her back about 36 hours ago, she also started to experience nausea and vomiting with 2 episodes of emesis last one having some blood mixed in her gastric contents, she in fact never had chest pain pain is mostly epigastric and some heartburn after her emesis.   Principal Problem:  UGI bleed  - status post EGD --> 2 columns of esophageal varices with fresh blood in esophagus/stomach, 4 bands placed on esophageal varices  - continue Octreotide as per GI recommendations  - placed on IV Rocephin 7/23, antiemetics as needed  - continue Protonix 40 mg BID IV  - Will need to be started on nadolol prior to discharge.  Active Problems:  Anxiety state, unspecified  - clinically stable this AM  Cirrhosis with alcoholism  - will provide alcohol cessation counseling once pt is more medically stable  GERD (gastroesophageal reflux disease)  - Protonix IV BID  Portal hypertension  - octreotide infusion x 72 hours  Acute on chronic blood loss anemia  - secondary to esophageal varices  - stable Hg and no sings of active bleeding  - repeat CBC in AM  Pancreatitis, alcoholic  - symptomatic management, analgesia and antiemetics as needed  - lipase trending  down Thrombocytopenia  - from alcohol induced bone marrow damage  - close monitoring of CBC  - SCD for DVT prophylaxis  Moderate malnutrition  - from acute illness and choric alcohol abuse  - advance diet as pt able to tolerate  Transaminitis  - from alcohol abuse  - LFT's trending down  - repeat CMET in AM   Consultants:  GI Procedures/Studies:  Dg Chest Port 1 View 06/11/2014 Borderline cardiomegaly. No acute cardiopulmonary process seen.  EGD 06/12/2014  Antibiotics:  None   Code Status: Full  Family Communication: Pt at bedside  Disposition Plan: Remains inpatient    HPI/Subjective: No events overnight.   Objective: Filed Vitals:   06/13/14 2059 06/14/14 0516 06/14/14 0724 06/14/14 1226  BP: 105/70 124/79  114/80  Pulse: 87 83  85  Temp: 99.1 F (37.3 C) 98.6 F (37 C)  98.2 F (36.8 C)  TempSrc: Oral Oral  Oral  Resp: 18 18  16   Height:      Weight:   77.656 kg (171 lb 3.2 oz)   SpO2: 98% 100%  95%    Intake/Output Summary (Last 24 hours) at 06/14/14 1518 Last data filed at 06/13/14 1841  Gross per 24 hour  Intake    195 ml  Output      0 ml  Net    195 ml    Exam:   General:  Pt is alert, follows commands appropriately, not in acute distress  Cardiovascular: Regular rate and rhythm, S1/S2, no murmurs, no rubs, no gallops  Respiratory: Clear to auscultation bilaterally, no  wheezing, no crackles, no rhonchi  Abdomen: Soft, bowel sounds present, no guarding  Extremities: No edema, pulses DP and PT palpable bilaterally  Neuro: Grossly nonfocal  Data Reviewed: Basic Metabolic Panel:  Recent Labs Lab 06/11/14 2218 06/12/14 0530 06/13/14 0040 06/14/14 0547  NA 140 144 136* 134*  K 4.3 3.8 3.8 3.9  CL 100 105 98 96  CO2 24 26 26 26   GLUCOSE 139* 102* 101* 111*  BUN 5* 8 6 7   CREATININE 0.55 0.52 0.47* 0.55  CALCIUM 8.9 8.2* 8.3* 8.8   Liver Function Tests:  Recent Labs Lab 06/11/14 2314 06/13/14 0040 06/14/14 0547  AST 190* 146*  137*  ALT 53* 42* 40*  ALKPHOS 137* 114 126*  BILITOT 1.3* 2.3* 2.1*  PROT 8.5* 7.5 8.0  ALBUMIN 3.5 3.2* 3.3*    Recent Labs Lab 06/12/14 0048 06/13/14 0040  LIPASE 150* 82*   No results found for this basename: AMMONIA,  in the last 168 hours CBC:  Recent Labs Lab 06/11/14 2218  06/12/14 0530 06/12/14 0810 06/12/14 1211 06/13/14 0040 06/14/14 0547  WBC 6.1  --  3.7*  --   --  3.0* 3.6*  HGB 11.6*  < > 9.8* 10.6* 10.5* 9.9* 11.5*  HCT 35.9*  < > 30.2* 32.1* 32.2* 31.3* 34.7*  MCV 96.2  --  97.1  --   --  97.8 95.9  PLT 59*  --  48*  --   --  40* 45*  < > = values in this interval not displayed. Cardiac Enzymes: No results found for this basename: CKTOTAL, CKMB, CKMBINDEX, TROPONINI,  in the last 168 hours BNP: No components found with this basename: POCBNP,  CBG: No results found for this basename: GLUCAP,  in the last 168 hours  Recent Results (from the past 240 hour(s))  URINE CULTURE     Status: None   Collection Time    06/11/14 10:48 PM      Result Value Ref Range Status   Specimen Description URINE, CLEAN CATCH   Final   Special Requests NONE   Final   Culture  Setup Time     Final   Value: 06/12/2014 04:47     Performed at [facility]     Final   Value: NO GROWTH     Performed at [facility]   Culture     Final   Value: NO GROWTH     Performed at [facility]   Report Status 06/13/2014 FINAL   Final  MRSA PCR SCREENING     Status: None   Collection Time    06/12/14  2:26 AM      Result Value Ref Range Status   MRSA by PCR NEGATIVE  NEGATIVE Final   Comment:            The GeneXpert MRSA Assay (FDA     approved for NASAL specimens     only), is one component of a     comprehensive MRSA colonization     surveillance program. It is not     intended to diagnose MRSA     infection nor to guide or     monitor treatment for     MRSA infections.     Scheduled Meds: . antiseptic oral rinse  15 mL Mouth  Rinse q12n4p  . cefTRIAXone (ROCEPHIN)  IV  2 g Intravenous Q24H  . chlordiazePOXIDE  10 mg Oral TID  . chlorhexidine  15 mL Mouth Rinse BID  . folic acid  1 mg Oral Daily  . LORazepam  0-4 mg Intravenous Q12H  . metoCLOPramide (REGLAN) injection  5 mg Intravenous 4 times per day  . multivitamin with minerals  1 tablet Oral Daily  . ondansetron (ZOFRAN) IV  8 mg Intravenous 4 times per day  . pantoprazole (PROTONIX) IV  40 mg Intravenous Q24H  . sodium chloride  3 mL Intravenous Q12H  . thiamine  100 mg Oral Daily   Or  . thiamine  100 mg Intravenous Daily   Continuous Infusions: . octreotide (SANDOSTATIN) infusion 25 mcg/hr (06/14/14 0629)   Faye Ramsay, MD  [facility] Pager [phone]  If 7PM-7AM, please contact night-coverage [email or web address] Password [email or web address] 06/14/2014, 3:18 PM   LOS: 3 days

## 2014-06-15 LAB — COMPREHENSIVE METABOLIC PANEL
ALK PHOS: 116 U/L (ref 39–117)
ALT: 37 U/L — ABNORMAL HIGH (ref 0–35)
ANION GAP: 12 (ref 5–15)
AST: 121 U/L — ABNORMAL HIGH (ref 0–37)
Albumin: 3.2 g/dL — ABNORMAL LOW (ref 3.5–5.2)
BUN: 7 mg/dL (ref 6–23)
CALCIUM: 8.6 mg/dL (ref 8.4–10.5)
CO2: 26 meq/L (ref 19–32)
Chloride: 96 mEq/L (ref 96–112)
Creatinine, Ser: 0.56 mg/dL (ref 0.50–1.10)
GLUCOSE: 101 mg/dL — AB (ref 70–99)
Potassium: 3.7 mEq/L (ref 3.7–5.3)
SODIUM: 134 meq/L — AB (ref 137–147)
Total Bilirubin: 1.8 mg/dL — ABNORMAL HIGH (ref 0.3–1.2)
Total Protein: 7.6 g/dL (ref 6.0–8.3)

## 2014-06-15 LAB — CBC
HCT: 33.4 % — ABNORMAL LOW (ref 36.0–46.0)
HEMOGLOBIN: 11 g/dL — AB (ref 12.0–15.0)
MCH: 31.3 pg (ref 26.0–34.0)
MCHC: 32.9 g/dL (ref 30.0–36.0)
MCV: 94.9 fL (ref 78.0–100.0)
Platelets: 46 10*3/uL — ABNORMAL LOW (ref 150–400)
RBC: 3.52 MIL/uL — AB (ref 3.87–5.11)
RDW: 13.7 % (ref 11.5–15.5)
WBC: 4.1 10*3/uL (ref 4.0–10.5)

## 2014-06-15 LAB — LIPASE, BLOOD: Lipase: 44 U/L (ref 11–59)

## 2014-06-15 NOTE — Progress Notes (Signed)
Subjective: No acute events.  Feeling well.  Objective: Vital signs in last 24 hours: Temp:  [98.2 F (36.8 C)-98.5 F (36.9 C)] 98.3 F (36.8 C) (07/26 0530) Pulse Rate:  [72-85] 73 (07/26 0530) Resp:  [16] 16 (07/26 0530) BP: (107-128)/(64-80) 107/64 mmHg (07/26 0530) SpO2:  [95 %-99 %] 97 % (07/26 0530) Weight:  [170 lb 9.6 oz (77.384 kg)] 170 lb 9.6 oz (77.384 kg) (07/26 0609)    Intake/Output from previous day: 07/25 0701 - 07/26 0700 In: 720 [P.O.:720] Out: -  Intake/Output this shift:    General appearance: alert and no distress GI: soft, non-tender; bowel sounds normal; no masses,  no organomegaly  Lab Results:  Recent Labs  06/13/14 0040 06/14/14 0547 06/15/14 0500  WBC 3.0* 3.6* 4.1  HGB 9.9* 11.5* 11.0*  HCT 31.3* 34.7* 33.4*  PLT 40* 45* 46*   BMET  Recent Labs  06/13/14 0040 06/14/14 0547 06/15/14 0500  NA 136* 134* 134*  K 3.8 3.9 3.7  CL 98 96 96  CO2 26 26 26   GLUCOSE 101* 111* 101*  BUN 6 7 7   CREATININE 0.47* 0.55 0.56  CALCIUM 8.3* 8.8 8.6   LFT  Recent Labs  06/15/14 0500  PROT 7.6  ALBUMIN 3.2*  AST 121*  ALT 37*  ALKPHOS 116  BILITOT 1.8*   PT/INR No results found for this basename: LABPROT, INR,  in the last 72 hours Hepatitis Panel No results found for this basename: HEPBSAG, HCVAB, HEPAIGM, HEPBIGM,  in the last 72 hours C-Diff No results found for this basename: CDIFFTOX,  in the last 72 hours Fecal Lactopherrin No results found for this basename: FECLLACTOFRN,  in the last 72 hours  Studies/Results: No results found.  Medications:  Scheduled: . antiseptic oral rinse  15 mL Mouth Rinse q12n4p  . cefTRIAXone (ROCEPHIN)  IV  2 g Intravenous Q24H  . chlordiazePOXIDE  10 mg Oral TID  . chlorhexidine  15 mL Mouth Rinse BID  . folic acid  1 mg Oral Daily  . LORazepam  0-4 mg Intravenous Q12H  . metoCLOPramide (REGLAN) injection  5 mg Intravenous 4 times per day  . multivitamin with minerals  1 tablet Oral Daily   . ondansetron (ZOFRAN) IV  8 mg Intravenous 4 times per day  . pantoprazole (PROTONIX) IV  40 mg Intravenous Q24H  . sodium chloride  3 mL Intravenous Q12H  . thiamine  100 mg Oral Daily   Or  . thiamine  100 mg Intravenous Daily   Continuous: . octreotide (SANDOSTATIN) infusion 25 mcg/hr (06/14/14 1194)    Assessment/Plan: 1) S/p esophageal variceal bleed with banding. 2) ETOH cirrhosis. 3) ETOH abuse.   She remains stable.  HGB increased without any blood transfusion.  No evidence of ETOH withdrawal.  Plan: 1) Continue with octreotide for through Monday for a total of 5 days. 2) Continue with ceftriaxone. 3) [facility] to resume care in AM.  LOS: 4 days   Sara [name] D 06/15/2014, 10:36 AM

## 2014-06-15 NOTE — Progress Notes (Signed)
Patient ID: Sara [name], female   DOB: [birth year], 38 y.o.   MRN: [id number]  [facility] PROGRESS NOTE  Amarilys Lyles ION:[id number] DOB: 08/19/1976 DOA: 06/11/2014 PCP: No PCP Per Patient  Brief narrative:  38 y.o. female, history of ongoing alcohol abuse counseled to quit, alcoholic cirrhosis with evidence of portal hypertension on recent CT scan, history of gastritis with upper GI bleed noted on EGD in 2012, chronic pancreatitis, portal hypertension induced thrombocytopenia, anxiety and depression, GERD who quit alcohol but started drinking again a few months ago presented to the ER after being on alcoholic binge for the last 2 days consuming multiple bottles of wine. She started to experience epigastric abdominal pain radiating to her back about 36 hours ago, she also started to experience nausea and vomiting with 2 episodes of emesis last one having some blood mixed in her gastric contents, she in fact never had chest pain pain is mostly epigastric and some heartburn after her emesis.   Principal Problem:  UGI bleed  - status post EGD --> 2 columns of esophageal varices with fresh blood in esophagus/stomach, 4 bands placed on esophageal varices  - continue Octreotide as per GI recommendations  - placed on IV Rocephin 7/23, antiemetics as needed  - continue Protonix 40 mg BID IV  - Will need to be started on nadolol prior to discharge per GI recommendations  Active Problems:  Anxiety state, unspecified  - clinically stable this AM  - still on CIWA protocol  Cirrhosis with alcoholism  - provided alcohol cessation counseling  GERD (gastroesophageal reflux disease)  - Protonix IV BID  Portal hypertension  - octreotide infusion per GI recommendations Acute on chronic blood loss anemia  - secondary to esophageal varices  - stable Hg and no sings of active bleeding  - repeat CBC in AM  Pancreatitis, alcoholic  - symptomatic management, analgesia and antiemetics as needed  - lipase  trending down and is WNL this AM Thrombocytopenia  - from alcohol induced bone marrow damage  - close monitoring of CBC  - SCD for DVT prophylaxis  Moderate malnutrition  - from acute illness and choric alcohol abuse  - advance diet as pt able to tolerate  Transaminitis  - from alcohol abuse  - LFT's trending down  - repeat CMET in AM   Consultants:  GI Procedures/Studies:  Dg Chest Port 1 View 06/11/2014 Borderline cardiomegaly. No acute cardiopulmonary process seen.  EGD 06/12/2014  Antibiotics:  Rocephin 7/23 -->  Code Status: Full  Family Communication: Pt at bedside  Disposition Plan: Remains inpatient, possible d/c in 1-2 days   HPI/Subjective: No events overnight.   Objective: Filed Vitals:   06/14/14 2111 06/15/14 0006 06/15/14 0530 06/15/14 0609  BP: 114/67 128/70 107/64   Pulse: 72 75 73   Temp: 98.3 F (36.8 C) 98.4 F (36.9 C) 98.3 F (36.8 C)   TempSrc: Oral Oral Oral   Resp: 16 16 16    Height:      Weight:    77.384 kg (170 lb 9.6 oz)  SpO2: 99% 96% 97%     Intake/Output Summary (Last 24 hours) at 06/15/14 0730 Last data filed at 06/14/14 1700  Gross per 24 hour  Intake    720 ml  Output      0 ml  Net    720 ml    Exam:   General:  Pt is alert, follows commands appropriately, not in acute distress  Cardiovascular: Regular rate and rhythm, S1/S2, no murmurs,  no rubs, no gallops  Respiratory: Clear to auscultation bilaterally, no wheezing, no crackles, no rhonchi  Abdomen: Soft, non tender, non distended, bowel sounds present, no guarding  Extremities: No edema, pulses DP and PT palpable bilaterally  Neuro: Grossly nonfocal  Data Reviewed: Basic Metabolic Panel:  Recent Labs Lab 06/11/14 2218 06/12/14 0530 06/13/14 0040 06/14/14 0547 06/15/14 0500  NA 140 144 136* 134* 134*  K 4.3 3.8 3.8 3.9 3.7  CL 100 105 98 96 96  CO2 24 26 26 26 26   GLUCOSE 139* 102* 101* 111* 101*  BUN 5* 8 6 7 7   CREATININE 0.55 0.52 0.47* 0.55 0.56   CALCIUM 8.9 8.2* 8.3* 8.8 8.6   Liver Function Tests:  Recent Labs Lab 06/11/14 2314 06/13/14 0040 06/14/14 0547 06/15/14 0500  AST 190* 146* 137* 121*  ALT 53* 42* 40* 37*  ALKPHOS 137* 114 126* 116  BILITOT 1.3* 2.3* 2.1* 1.8*  PROT 8.5* 7.5 8.0 7.6  ALBUMIN 3.5 3.2* 3.3* 3.2*    Recent Labs Lab 06/12/14 0048 06/13/14 0040 06/15/14 0500  LIPASE 150* 82* 44   CBC:  Recent Labs Lab 06/11/14 2218  06/12/14 0530 06/12/14 0810 06/12/14 1211 06/13/14 0040 06/14/14 0547 06/15/14 0500  WBC 6.1  --  3.7*  --   --  3.0* 3.6* 4.1  HGB 11.6*  < > 9.8* 10.6* 10.5* 9.9* 11.5* 11.0*  HCT 35.9*  < > 30.2* 32.1* 32.2* 31.3* 34.7* 33.4*  MCV 96.2  --  97.1  --   --  97.8 95.9 94.9  PLT 59*  --  48*  --   --  40* 45* 46*  < > = values in this interval not displayed.   Recent Results (from the past 240 hour(s))  URINE CULTURE     Status: None   Collection Time    06/11/14 10:48 PM      Result Value Ref Range Status   Specimen Description URINE, CLEAN CATCH   Final   Special Requests NONE   Final   Culture  Setup Time     Final   Value: 06/12/2014 04:47     Performed at [facility]     Final   Value: NO GROWTH     Performed at [facility]   Culture     Final   Value: NO GROWTH     Performed at [facility]   Report Status 06/13/2014 FINAL   Final  MRSA PCR SCREENING     Status: None   Collection Time    06/12/14  2:26 AM      Result Value Ref Range Status   MRSA by PCR NEGATIVE  NEGATIVE Final   Comment:            The GeneXpert MRSA Assay (FDA     approved for NASAL specimens     only), is one component of a     comprehensive MRSA colonization     surveillance program. It is not     intended to diagnose MRSA     infection nor to guide or     monitor treatment for     MRSA infections.     Scheduled Meds: . antiseptic oral rinse  15 mL Mouth Rinse q12n4p  . cefTRIAXone (ROCEPHIN)  IV  2 g Intravenous Q24H  .  chlordiazePOXIDE  10 mg Oral TID  . chlorhexidine  15 mL Mouth Rinse BID  . folic acid  1 mg Oral Daily  . LORazepam  0-4 mg Intravenous Q12H  . metoCLOPramide (REGLAN) injection  5 mg Intravenous 4 times per day  . multivitamin with minerals  1 tablet Oral Daily  . ondansetron (ZOFRAN) IV  8 mg Intravenous 4 times per day  . pantoprazole (PROTONIX) IV  40 mg Intravenous Q24H  . sodium chloride  3 mL Intravenous Q12H  . thiamine  100 mg Oral Daily   Or  . thiamine  100 mg Intravenous Daily   Continuous Infusions: . octreotide (SANDOSTATIN) infusion 25 mcg/hr (06/14/14 0629)     Faye Ramsay, MD  [facility] Pager [phone]  If 7PM-7AM, please contact night-coverage [email or web address] Password [email or web address] 06/15/2014, 7:30 AM   LOS: 4 days

## 2014-06-16 ENCOUNTER — Encounter: Payer: Self-pay | Admitting: Gastroenterology

## 2014-06-16 DIAGNOSIS — F1010 Alcohol abuse, uncomplicated: Secondary | ICD-10-CM

## 2014-06-16 LAB — COMPREHENSIVE METABOLIC PANEL
ALBUMIN: 3.1 g/dL — AB (ref 3.5–5.2)
ALT: 34 U/L (ref 0–35)
AST: 101 U/L — ABNORMAL HIGH (ref 0–37)
Alkaline Phosphatase: 111 U/L (ref 39–117)
Anion gap: 14 (ref 5–15)
BUN: 8 mg/dL (ref 6–23)
CO2: 24 mEq/L (ref 19–32)
CREATININE: 0.56 mg/dL (ref 0.50–1.10)
Calcium: 8.6 mg/dL (ref 8.4–10.5)
Chloride: 97 mEq/L (ref 96–112)
GFR calc Af Amer: >90 mL/min (ref >90)
GFR calc non Af Amer: >90 mL/min (ref >90)
Glucose, Bld: 96 mg/dL (ref 70–99)
Potassium: 3.7 mEq/L (ref 3.7–5.3)
Sodium: 135 mEq/L — ABNORMAL LOW (ref 137–147)
TOTAL PROTEIN: 7.3 g/dL (ref 6.0–8.3)
Total Bilirubin: 1.4 mg/dL — ABNORMAL HIGH (ref 0.3–1.2)

## 2014-06-16 LAB — CBC
HCT: 32.6 % — ABNORMAL LOW (ref 36.0–46.0)
Hemoglobin: 10.6 g/dL — ABNORMAL LOW (ref 12.0–15.0)
MCH: 31.5 pg (ref 26.0–34.0)
MCHC: 32.5 g/dL (ref 30.0–36.0)
MCV: 96.7 fL (ref 78.0–100.0)
PLATELETS: 63 10*3/uL — AB (ref 150–400)
RBC: 3.37 MIL/uL — ABNORMAL LOW (ref 3.87–5.11)
RDW: 13.9 % (ref 11.5–15.5)
WBC: 4.6 10*3/uL (ref 4.0–10.5)

## 2014-06-16 MED ORDER — SUCRALFATE 1 GM/10ML PO SUSP
1.0000 g | Freq: Three times a day (TID) | ORAL | Status: DC
  Administered 2014-06-16: 1 g via ORAL
  Filled 2014-06-16 (×4): qty 10

## 2014-06-16 MED ORDER — SUCRALFATE 1 GM/10ML PO SUSP: 1.0000 g | Freq: Three times a day (TID) | ORAL | Status: DC

## 2014-06-16 MED ORDER — ALUM & MAG HYDROXIDE-SIMETH 200-200-20 MG/5ML PO SUSP: 30.0000 mL | Freq: Four times a day (QID) | ORAL | Status: DC | PRN

## 2014-06-16 MED ORDER — NADOLOL 40 MG PO TABS: 40.0000 mg | ORAL_TABLET | Freq: Every day | ORAL | Status: DC

## 2014-06-16 MED ORDER — CIPROFLOXACIN HCL 500 MG PO TABS: 500.0000 mg | ORAL_TABLET | Freq: Two times a day (BID) | ORAL | Status: AC

## 2014-06-16 MED ORDER — PANTOPRAZOLE SODIUM 40 MG PO TBEC
40.0000 mg | DELAYED_RELEASE_TABLET | Freq: Every morning | ORAL | Status: DC
  Administered 2014-06-16: 40 mg via ORAL
  Filled 2014-06-16 (×2): qty 1

## 2014-06-16 NOTE — Progress Notes (Signed)
Clinical Social Work Department BRIEF PSYCHOSOCIAL ASSESSMENT 06/16/2014  Patient:  Sara [name], Sara [name]     Account Number:  [id number]     Admit date:  06/11/2014  Clinical Social Worker:  Earlie Server  Date/Time:  06/16/2014 12:00 N  Referred by:  Physician  Date Referred:  06/16/2014 Referred for  Substance Abuse   Other Referral:   Interview type:  Patient Other interview type:    PSYCHOSOCIAL DATA Living Status:  FAMILY Admitted from facility:   Level of care:   Primary support name:  [name] Primary support relationship to patient:  SPOUSE Degree of support available:   Strong    CURRENT CONCERNS Current Concerns  Substance Abuse   Other Concerns:    SOCIAL WORK ASSESSMENT / PLAN CSW received referral to complete psychosocial assessment due to substance use. CSW reviewed chart and met with patient at bedside. CSW introduced myself and explained role.    Patient reports she lives at home with her husband and 71 and 32 year old children. Patient stopped working in August 2014 because she wanted to get sober and live a healthier life. Patient reports she has been to [facility] and spent 2 months at a rehab facility in [place] but they were not helpful. Patient reports she was sober for about 2 months but relapsed. Patient states that alcohol has affected her life and family wants to her quit drinking. Patient reports she has anxiety problems and learned to cope with feelings by drinking alcohol. Patient does not see a psychiatrist and is not interested in any outpatient follow up. Patient currently goes to weekly dance/drama therapy with an addiction specialist and reports it has been helpful. Patient reports she is not interested in any further treatment and does not feel that she needs further support. Patient reports with family support that she will stay sober.    CSW agreeable to continue to follow in order to offer assistance.   Assessment/plan status:  Psychosocial  Support/Ongoing Assessment of Needs Other assessment/ plan:   SBIRT   Information/referral to community resources:   Patient politely declined [facility] meetings, outpatient, intensive outpatient, and inpatient resources for SA treatment    PATIENT'S/FAMILY'S RESPONSE TO PLAN OF CARE: Patient alert and oriented. Patient reports she is fully aware of her alcohol consumption and need for treatment. Patient is upset with herself for relapsing but wants to stay sober for her family. Patient is hopeful to stay sober and be able to work again in order to contribute to the household needs. Patient reports she is trying to abide by therapy goals and stay busy so that she does not drink alcohol. Patient thanked CSW for visit but reports she does not want any further resources.       [place], [place] [phone]

## 2014-06-16 NOTE — Discharge Instructions (Addendum)
Finding Treatment for Alcohol and Drug Addiction It can be hard to find the right place to get professional treatment. Here are some important things to consider:  There are different types of treatment to choose from.  Some programs are live-in (residential) while others are not (outpatient). Sometimes a combination is offered.  No single type of program is right for everyone.  Most treatment programs involve a combination of education, counseling, and a 12-step, spiritually-based approach.  There are non-spiritually based programs (not 12-step).  Some treatment programs are government sponsored. They are geared for patients without private insurance.  Treatment programs can vary in many respects such as:  Cost and types of insurance accepted.  Types of on-site medical services offered.  Length of stay, setting, and size.  Overall philosophy of treatment. A person may need specialized treatment or have needs not addressed by all programs. For example, adolescents need treatment appropriate for their age. Other people have secondary disorders that must be managed as well. Secondary conditions can include mental illness, such as depression or diabetes. Often, a period of detoxification from alcohol or drugs is needed. This requires medical supervision and not all programs offer this. THINGS TO CONSIDER WHEN SELECTING A TREATMENT PROGRAM   Is the program certified by the appropriate government agency? Even private programs must be certified and employ certified professionals.  Does the program accept your insurance? If not, can a payment plan be set up?  Is the facility clean, organized, and well run? Do they allow you to speak with graduates who can share their treatment experience with you? Can you tour the facility? Can you meet with staff?  Does the program meet the full range of individual needs?  Does the treatment program address sexual orientation and physical disabilities?  Do they provide age, gender, and culturally appropriate treatment services?  Is treatment available in languages other than English?  Is long-term aftercare support or guidance encouraged and provided?  Is assessment of an individual's treatment plan ongoing to ensure it meets changing needs?  Does the program use strategies to encourage reluctant patients to remain in treatment long enough to increase the likelihood of success?  Does the program offer counseling (individual or group) and other behavioral therapies?  Does the program offer medicine as part of the treatment regimen, if needed?  Is there ongoing monitoring of possible relapse? Is there a defined relapse prevention program? Are services or referrals offered to family members to ensure they understand addiction and the recovery process? This would help them support the recovering individual.  Are 12-step meetings held at the center or is transport available for patients to attend outside meetings? In countries outside of the [place] and [place], [employer], [employer] for contact information for services in your area. Document Released: 10/06/2005 Document Revised: 01/30/2012 Document Reviewed: 04/17/2008 [facility] Patient Information 2015 [place], [place]. This information is not intended to replace advice given to you by your health care provider. Make sure you discuss any questions you have with your health care provider.   Gastrointestinal Bleeding Gastrointestinal bleeding is bleeding somewhere along the path that food travels through the body (digestive tract). This path is anywhere between the mouth and the opening of the butt (anus). You may have blood in your throw up (vomit) or in your poop (stools). If there is a lot of bleeding, you may need to stay in the hospital. [place]  Only take medicine as told by your doctor.  Eat foods with fiber such  as whole grains, fruits, and vegetables. You can also try eating 1 to 3  prunes a day.  Drink enough fluids to keep your pee (urine) clear or pale yellow. GET HELP RIGHT AWAY IF:   Your bleeding gets worse.  You feel dizzy, weak, or you pass out (faint).  You have bad cramps in your back or belly (abdomen).  You have large blood clumps (clots) in your poop.  Your problems are getting worse. MAKE SURE YOU:   Understand these instructions.  Will watch your condition.  Will get help right away if you are not doing well or get worse. Document Released: 08/16/2008 Document Revised: 10/24/2012 Document Reviewed: 10/17/2011 [facility] Patient Information 2015 [place], [place]. This information is not intended to replace advice given to you by your health care provider. Make sure you discuss any questions you have with your health care provider.

## 2014-06-16 NOTE — Discharge Summary (Signed)
Physician Discharge Summary  Sara [name] QTM:[id number] DOB: [birth year] DOA: 06/11/2014  PCP: Tami Lin,, MD  Admit date: 06/11/2014 Discharge date: 06/16/2014  Time spent: 40  minutes  Recommendations for Outpatient Follow-up:  1. Discharge home with outpt PCP and GI follow up 2.  patient will be discharged on oral ciprofloxacin to complete 7 days of antibiotics n 06/18/2014. Needs h&H monitored in 1 week after discharge. Has follow up with GI in 1 week. Patient will be scheduled for repeat EGD in 4 weeks.  Discharge Diagnoses:  Principal Problem:   Esophageal varices with bleeding  Active Problems:   Hematemesis   UGI bleed   Anxiety state, unspecified   Cirrhosis with alcoholism   GERD (gastroesophageal reflux disease)   Portal hypertension   Anemia   Pancreatitis   Alcohol dependence syndrome thrombocytopenia   Discharge Condition: fair  Diet recommendation: soft diet, advance to regular in 1-2 days  Filed Weights   06/14/14 0724 06/15/14 0609 06/16/14 0524  Weight: 77.656 kg (171 lb 3.2 oz) 77.384 kg (170 lb 9.6 oz) 77.656 kg (171 lb 3.2 oz)    History of present illness:  Please refer to admission H&P for details, but in brief, 38 y.o. female, history of ongoing alcohol abuse counseled to quit, alcoholic cirrhosis with evidence of portal hypertension on recent CT scan, history of gastritis with upper GI bleed noted on EGD in 2012, chronic pancreatitis, portal hypertension,  thrombocytopenia, anxiety and depression, GERD who quit alcohol but started drinking again a few months ago presented to the ER after being on alcoholic binge for the last 2 days consuming multiple bottles of wine. She started to experience epigastric abdominal pain radiating to her back about 36 hours prior to presentation.  she also started to experience nausea and vomiting with 2 episodes of emesis last one having some blood mixed in her gastric contents. Patient admitted to hospitalist  service for hematemesis and [facility] GI consulted. She was started on 5 days of octeotride drip and had EGD on 7/22.  Hospital Course:  UGI bleed due to  bleeding esophageal varices - started on octreotide drip. status post EGD on 7/22 showing 2 columns of esophageal varices with fresh blood in esophagus/stomach, 4 bands placed on esophageal varices . - completed 5 days of Octreotide drip today. No further hematemesis and hb stable at 10.6 today. - placed on IV Rocephin 7/23 for SBP prophylaxis, ( day 4) will discharge on oral ciprofloxacin to complete a 7 day course.  - continue Protonix 40 mg daily - added Carafate given symptoms of epigastric discomfort , possibly from banding. on nadolol  -will discharge her on nadolol 40 mg daily for portal hypertension. -counseled strongly on etoh cessation and also avoiding NSAIDs which she was taking for headaches as outpt -pt seen by GI and cleared for discharge. Has follow up on 06/24/2014. She will need repeat EGD in 4 weeks.  Active Problems:  Anxiety state, unspecified  -stable with no withdrawal symptoms. Does  not need further librium or BZD  Cirrhosis with alcoholism  - provided alcohol cessation counseling . SW provided community resources. She plans on quitting etoh  GERD (gastroesophageal reflux disease)  - continue daily PPI. added carafate   Portal hypertension  . discharge home on nadolol 40 mg daily  Acute on chronic blood loss anemia  - secondary to esophageal varices  - stable Hg and no sings of active bleeding  -f/up as outpt  Pancreatitis, alcoholic  - symptomatic management with  analgesia and antiemetics as needed . Now resolved. - lipase normal   Thrombocytopenia  -alcohol induced  -  monitor as outpt  Moderate malnutrition  - from acute illness and chroric alcohol abuse  -tolerating advanced diet  Transaminitis  - from alcohol abuse  - LFT's trending down , AST of 101 today, normal ALT and ALK  P   Consultants:  [facility]   Procedures/Studies:  EGD 06/12/2014   Antibiotics:  Rocephin 7/23 -->7/27, oral cipro until 7/29   Code Status: Full  Family Communication: none at bedside  Disposition Plan: d/c home with outpt follow up     Discharge Exam: Filed Vitals:   06/16/14 0524  BP: 102/61  Pulse: 70  Temp: 98 F (36.7 C)  Resp: 16    General: middle aged female in NAD HEENT:  pallor+, moist oral mucosa, no icterus Cardiovascular: NS1&S2, no Murmurs Chest: clear b/l  abd: soft, NT, ND, BS+ Ext: warm, no tremors CNS: alert and oreinted   Discharge Instructions You were cared for by a hospitalist during your hospital stay. If you have any questions about your discharge medications or the care you received while you were in the hospital after you are discharged, you can call the unit and asked to speak with the hospitalist on call if the hospitalist that took care of you is not available. Once you are discharged, your primary care physician will handle any further medical issues. Please note that NO REFILLS for any discharge medications will be authorized once you are discharged, as it is imperative that you return to your primary care physician (or establish a relationship with a primary care physician if you do not have one) for your aftercare needs so that they can reassess your need for medications and monitor your lab values.     Medication List    STOP taking these medications       ibuprofen 800 MG tablet  Commonly known as:  ADVIL,MOTRIN      TAKE these medications       alum & mag hydroxide-simeth 200-200-20 MG/5ML suspension  Commonly known as:  MAALOX/MYLANTA  Take 30 mLs by mouth every 6 (six) hours as needed for indigestion or heartburn (dyspepsia).     ciprofloxacin 500 MG tablet  Commonly known as:  CIPRO  Take 1 tablet (500 mg total) by mouth 2 (two) times daily. Until 6/96/2952     folic acid 1 MG tablet  Commonly known as:  FOLVITE   Take 1 mg by mouth daily.     multivitamin with minerals Tabs tablet  Take 1 tablet by mouth daily. For low vitamin     nadolol 40 MG tablet  Commonly known as:  CORGARD  Take 1 tablet (40 mg total) by mouth daily.     pantoprazole 40 MG tablet  Commonly known as:  PROTONIX  Take 40 mg by mouth daily.     sucralfate 1 GM/10ML suspension  Commonly known as:  CARAFATE  Take 10 mLs (1 g total) by mouth 4 (four) times daily -  with meals and at bedtime.       Allergies  Allergen Reactions  . Morphine And Related     Slowed HR, lowered BP  . Morphine And Related Other (See Comments)    hypotension       Follow-up Information   Follow up with Tye Savoy, NP On 06/27/2014. (at 10:00am)    Specialty:  Nurse Practitioner   Contact information:  [address] [place] [address] [phone]        The results of significant diagnostics from this hospitalization (including imaging, microbiology, ancillary and laboratory) are listed below for reference.    Significant Diagnostic Studies: Dg Chest Port 1 View  06/11/2014   CLINICAL DATA:  Chest pain, radiating to the back.  EXAM: PORTABLE CHEST - 1 VIEW  COMPARISON:  Chest radiograph performed 10/08/2013  FINDINGS: The lungs are well-aerated. Pulmonary vascularity is at the upper limits of normal. There is no evidence of focal opacification, pleural effusion or pneumothorax.  The cardiomediastinal silhouette is borderline enlarged. No acute osseous abnormalities are seen.  IMPRESSION: Borderline cardiomegaly.  No acute cardiopulmonary process seen.   Electronically Signed   By: Garald Balding M.D.   On: 06/11/2014 22:40    Microbiology: Recent Results (from the past 240 hour(s))  URINE CULTURE     Status: None   Collection Time    06/11/14 10:48 PM      Result Value Ref Range Status   Specimen Description URINE, CLEAN CATCH   Final   Special Requests NONE   Final   Culture  Setup Time     Final   Value:  06/12/2014 04:47     Performed at [facility]     Final   Value: NO GROWTH     Performed at [facility]   Culture     Final   Value: NO GROWTH     Performed at [facility]   Report Status 06/13/2014 FINAL   Final  MRSA PCR SCREENING     Status: None   Collection Time    06/12/14  2:26 AM      Result Value Ref Range Status   MRSA by PCR NEGATIVE  NEGATIVE Final   Comment:            The GeneXpert MRSA Assay (FDA     approved for NASAL specimens     only), is one component of a     comprehensive MRSA colonization     surveillance program. It is not     intended to diagnose MRSA     infection nor to guide or     monitor treatment for     MRSA infections.     Labs: Basic Metabolic Panel:  Recent Labs Lab 06/12/14 0530 06/13/14 0040 06/14/14 0547 06/15/14 0500 06/16/14 0432  NA 144 136* 134* 134* 135*  K 3.8 3.8 3.9 3.7 3.7  CL 105 98 96 96 97  CO2 '26 26 26 26 24  ' GLUCOSE 102* 101* 111* 101* 96  BUN '8 6 7 7 8  ' CREATININE 0.52 0.47* 0.55 0.56 0.56  CALCIUM 8.2* 8.3* 8.8 8.6 8.6   Liver Function Tests:  Recent Labs Lab 06/11/14 2314 06/13/14 0040 06/14/14 0547 06/15/14 0500 06/16/14 0432  AST 190* 146* 137* 121* 101*  ALT 53* 42* 40* 37* 34  ALKPHOS 137* 114 126* 116 111  BILITOT 1.3* 2.3* 2.1* 1.8* 1.4*  PROT 8.5* 7.5 8.0 7.6 7.3  ALBUMIN 3.5 3.2* 3.3* 3.2* 3.1*    Recent Labs Lab 06/12/14 0048 06/13/14 0040 06/15/14 0500  LIPASE 150* 82* 44   No results found for this basename: AMMONIA,  in the last 168 hours CBC:  Recent Labs Lab 06/12/14 0530  06/12/14 1211 06/13/14 0040 06/14/14 0547 06/15/14 0500 06/16/14 0432  WBC 3.7*  --   --  3.0* 3.6* 4.1 4.6  HGB 9.8*  < > 10.5* 9.9* 11.5* 11.0* 10.6*  HCT 30.2*  < > 32.2* 31.3* 34.7* 33.4* 32.6*  MCV 97.1  --   --  97.8 95.9 94.9 96.7  PLT 48*  --   --  40* 45* 46* 63*  < > = values in this interval not displayed. Cardiac Enzymes: No results found  for this basename: CKTOTAL, CKMB, CKMBINDEX, TROPONINI,  in the last 168 hours BNP: BNP (last 3 results)  Recent Labs  06/11/14 2315  PROBNP 5.0   CBG: No results found for this basename: GLUCAP,  in the last 168 hours     Signed:  Louellen Molder  [facility] 06/16/2014, 12:54 PM

## 2014-06-16 NOTE — Progress Notes (Signed)
    Progress Note   Subjective  intermittent chest discomfort, especially with meals.    Objective   Vital signs in last 24 hours: Temp:  [98 F (36.7 C)-98.9 F (37.2 C)] 98 F (36.7 C) (07/27 0524) Pulse Rate:  [70-78] 70 (07/27 0524) Resp:  [16-18] 16 (07/27 0524) BP: (102-119)/(50-68) 102/61 mmHg (07/27 0524) SpO2:  [96 %-99 %] 97 % (07/27 0524) Weight:  [171 lb 3.2 oz (77.656 kg)] 171 lb 3.2 oz (77.656 kg) (07/27 0524)   General:    Pleasant Hispanic female in NAD Heart:  Regular rate and rhythm Abdomen:  Soft, nontender and nondistended. Normal bowel sounds. Extremities:  Without edema. Neurologic:  Alert and oriented,  grossly normal neurologically. Psych:  Cooperative. Normal mood and affect.  Lab Results:  Recent Labs  06/14/14 0547 06/15/14 0500 06/16/14 0432  WBC 3.6* 4.1 4.6  HGB 11.5* 11.0* 10.6*  HCT 34.7* 33.4* 32.6*  PLT 45* 46* 63*   BMET  Recent Labs  06/14/14 0547 06/15/14 0500 06/16/14 0432  NA 134* 134* 135*  K 3.9 3.7 3.7  CL 96 96 97  CO2 26 26 24   GLUCOSE 111* 101* 96  BUN 7 7 8   CREATININE 0.55 0.56 0.56  CALCIUM 8.8 8.6 8.6   LFT  Recent Labs  06/16/14 0432  PROT 7.3  ALBUMIN 3.1*  AST 101*  ALT 34  ALKPHOS 111  BILITOT 1.4*     Assessment / Plan:   57. 38 year old Hispanic female with decompensated ETOH cirrhosis with variceal bleed, s/p banding 7/23. Will need repeat EGD with banding in a few weeks with Dr. Deatra Ina. On 4th day of Rocephin, should complete 7 days of antibiotics for SBP prophylaxis. Will d/c Octreotide (completed 3 days). Home on daily PPI. She continues to complain of chest discomfort with meals, probably related to banding. Will add TID Carafate. Follow up office appointment made with me for 06/24/14. She   2. Anemia of acute blood loss. No transfusion required.  Hgb stable at 10.6.    LOS: 5 days   Tye Savoy  06/16/2014, 8:12 AM      Attending physician's note   I have taken an interval  history, reviewed the chart and examined the patient. I agree with the Advanced Practitioner's note, impression and recommendations.  DC octreotide and plan for discharge. Outpatient follow up with Dr. Erskine Emery.  Pricilla Riffle. Fuller Plan, MD [facility]

## 2014-06-16 NOTE — Evaluation (Signed)
Physical Therapy One Time Evaluation Patient Details Name: Sara [name] MRN: [id number] DOB: 07-10-1976 Today's Date: 06/16/2014   History of Present Illness  38 y.o. female, history of ongoing alcohol abuse, alcoholic cirrhosis, portal hypertension, chronic pancreatitis, anxiety and depression, GERD admitted 06/11/14 with upper GI bleed status post EGD: 2 columns of esophageal varices with fresh blood in esophagus/stomach, 4 bands placed on esophageal varices    Clinical Impression  Patient evaluated by Physical Therapy with no further acute PT needs identified. All education has been completed and the patient has no further questions. See below for any follow-up Physial Therapy or equipment needs. PT is signing off. Thank you for this referral.     Follow Up Recommendations No PT follow up    Equipment Recommendations  None recommended by PT    Recommendations for Other Services       Precautions / Restrictions Precautions Precautions: Fall      Mobility  Bed Mobility               General bed mobility comments: sitting up in chair on arrival  Transfers Overall transfer level: Modified independent                  Ambulation/Gait Ambulation/Gait assistance: Modified independent (Device/Increase time) Ambulation Distance (Feet): 350 Feet Assistive device: None Gait Pattern/deviations: WFL(Within Functional Limits)     General Gait Details: no gait abnormalities observed, no unsteadiness or LOB observed, pt feels at baseline  Stairs            Wheelchair Mobility    Modified Rankin (Stroke Patients Only)       Balance                                             Pertinent Vitals/Pain No pain reported    Home Living Family/patient expects to be discharged to:: Private residence Living Arrangements: Spouse/significant other Available Help at Discharge: Family   Home Access: Stairs to enter   Technical brewer of  Steps:  a few          Prior Function Level of Independence: Independent         Comments: reports no difficulty with ADLs or mobility at home     Hand Dominance        Extremity/Trunk Assessment               Lower Extremity Assessment: Overall WFL for tasks assessed         Communication   Communication: No difficulties  Cognition Arousal/Alertness: Awake/alert Behavior During Therapy: WFL for tasks assessed/performed Overall Cognitive Status: Within Functional Limits for tasks assessed                      General Comments      Exercises        Assessment/Plan    PT Assessment Patent does not need any further PT services  PT Diagnosis     PT Problem List    PT Treatment Interventions     PT Goals (Current goals can be found in the Care Plan section) Acute Rehab PT Goals PT Goal Formulation: No goals set, d/c therapy    Frequency     Barriers to discharge        Co-evaluation  End of Session   Activity Tolerance: Patient tolerated treatment well Patient left: in chair;with call bell/phone within reach           Time: 2956-2130 PT Time Calculation (min): 4 min   Charges:   PT Evaluation $Initial PT Evaluation Tier I: 1 Procedure     PT G Codes:          Domanic Matusek,KATHrine E 06/16/2014, 12:34 PM Carmelia Bake, PT, DPT 06/16/2014 Pager: [phone]

## 2014-06-18 NOTE — Op Note (Signed)
[facility] [place], [address]   ENDOSCOPY PROCEDURE REPORT  PATIENT: Sara [name], Sara [name]  MR#: [id number] BIRTHDATE: [birth year] , 38  yrs. old GENDER: Female ENDOSCOPIST: Gatha Mayer, MD, [facility] PROCEDURE DATE:  06/12/2014 PROCEDURE:  EGD w/ band ligation of varices ASA CLASS:     Class III INDICATIONS:  Hematemesis. MEDICATIONS: Benadryl 50 mg IV, Fentanyl 125 mcg IV, and Versed 12 mg IV TOPICAL ANESTHETIC: Cetacaine Spray  DESCRIPTION OF PROCEDURE: After the risks benefits and alternatives of the procedure were thoroughly explained, informed consent was obtained.  The    endoscope was introduced through the mouth and advanced to the second portion of the duodenum. Without limitations.  The instrument was slowly withdrawn as the mucosa was fully examined.    ESOPHAGUS: There were 2 columns of medium size varices in the distal and middle third of the esophagus.  The varices were actively bleeding.  Band ligation x 4 was performed.  STOMACH: Blood was found in the stomach in the entire examined stomach.  The remainder of the upper endoscopy exam was otherwise normal. Retroflexed views revealed as above.     The scope was then withdrawn from the patient and the procedure completed.  COMPLICATIONS: There were no complications. ENDOSCOPIC IMPRESSION: 1.   There were 2 columns of medium size varices in the distal and middle third of the esophagus; The varices were; band ligation x 4 was performed 2.   Blood was found in the stomach in the entire examined stomach 3.   The remainder of the upper endoscopy exam was otherwise normal  RECOMMENDATIONS: support octreotide x 48 hrs-72 hrs antiemetics Ceftriaxone prophylaxis repeat EGD 2-4 weeks - Deatra Ina  eSigned:  Gatha Mayer, MD, [facility] 06/13/2014 2:36 PM

## 2014-06-27 ENCOUNTER — Ambulatory Visit: Payer: Self-pay | Admitting: Nurse Practitioner

## 2014-07-09 ENCOUNTER — Encounter: Payer: Self-pay | Admitting: Nurse Practitioner

## 2014-07-09 ENCOUNTER — Other Ambulatory Visit (INDEPENDENT_AMBULATORY_CARE_PROVIDER_SITE_OTHER): Payer: PRIVATE HEALTH INSURANCE

## 2014-07-09 ENCOUNTER — Ambulatory Visit (INDEPENDENT_AMBULATORY_CARE_PROVIDER_SITE_OTHER): Payer: PRIVATE HEALTH INSURANCE | Admitting: Nurse Practitioner

## 2014-07-09 VITALS — BP 108/68 | HR 80 | Ht 63.75 in | Wt 174.0 lb

## 2014-07-09 DIAGNOSIS — I8500 Esophageal varices without bleeding: Secondary | ICD-10-CM

## 2014-07-09 DIAGNOSIS — K7030 Alcoholic cirrhosis of liver without ascites: Secondary | ICD-10-CM

## 2014-07-09 LAB — CBC WITH DIFFERENTIAL/PLATELET
Basophils Absolute: 0 10*3/uL (ref 0.0–0.1)
Basophils Relative: 0.2 % (ref 0.0–3.0)
EOS ABS: 0.1 10*3/uL (ref 0.0–0.7)
Eosinophils Relative: 1.3 % (ref 0.0–5.0)
HCT: 37 % (ref 36.0–46.0)
HEMOGLOBIN: 12.3 g/dL (ref 12.0–15.0)
LYMPHS PCT: 47.4 % — AB (ref 12.0–46.0)
Lymphs Abs: 2.8 10*3/uL (ref 0.7–4.0)
MCHC: 33.3 g/dL (ref 30.0–36.0)
MCV: 92 fl (ref 78.0–100.0)
Monocytes Absolute: 0.3 10*3/uL (ref 0.1–1.0)
Monocytes Relative: 5.8 % (ref 3.0–12.0)
NEUTROS ABS: 2.7 10*3/uL (ref 1.4–7.7)
NEUTROS PCT: 45.3 % (ref 43.0–77.0)
Platelets: 133 10*3/uL — ABNORMAL LOW (ref 150.0–400.0)
RBC: 4.02 Mil/uL (ref 3.87–5.11)
RDW: 14.2 % (ref 11.5–15.5)
WBC: 5.9 10*3/uL (ref 4.0–10.5)

## 2014-07-09 LAB — PROTIME-INR
INR: 1.2 ratio — ABNORMAL HIGH (ref 0.8–1.0)
Prothrombin Time: 13 s (ref 9.6–13.1)

## 2014-07-09 LAB — HEPATITIS B SURFACE ANTIGEN: Hepatitis B Surface Ag: NEGATIVE

## 2014-07-09 LAB — COMPREHENSIVE METABOLIC PANEL
ALT: 49 U/L — AB (ref 0–35)
AST: 89 U/L — ABNORMAL HIGH (ref 0–37)
Albumin: 3.7 g/dL (ref 3.5–5.2)
Alkaline Phosphatase: 103 U/L (ref 39–117)
BUN: 5 mg/dL — ABNORMAL LOW (ref 6–23)
CALCIUM: 9 mg/dL (ref 8.4–10.5)
CHLORIDE: 108 meq/L (ref 96–112)
CO2: 24 mEq/L (ref 19–32)
Creatinine, Ser: 0.5 mg/dL (ref 0.4–1.2)
GFR: 147.4 mL/min (ref >60.00)
Glucose, Bld: 111 mg/dL — ABNORMAL HIGH (ref 70–99)
Potassium: 3.8 mEq/L (ref 3.5–5.1)
Sodium: 140 mEq/L (ref 135–145)
Total Bilirubin: 1.4 mg/dL — ABNORMAL HIGH (ref 0.2–1.2)
Total Protein: 8.4 g/dL — ABNORMAL HIGH (ref 6.0–8.3)

## 2014-07-09 LAB — HEPATITIS A ANTIBODY, TOTAL: Hep A Total Ab: NONREACTIVE

## 2014-07-09 LAB — FERRITIN: Ferritin: 17 ng/mL (ref 10.0–291.0)

## 2014-07-09 LAB — HEPATITIS C ANTIBODY: HCV AB: NEGATIVE

## 2014-07-09 LAB — HEPATITIS B SURFACE ANTIBODY,QUALITATIVE: HEP B S AB: NEGATIVE

## 2014-07-09 NOTE — Patient Instructions (Addendum)
Your physician has requested that you go to the basement for lab work before leaving today  Decrease your Corgard to 20mg .   Let us know if you cannot tolerate this.   You have been scheduled for an endoscopy with banding at [facility]; Please follow written instructions given to you at your visit today. If you use inhalers (even only as needed), please bring them with you on the day of your procedure.

## 2014-07-09 NOTE — Progress Notes (Signed)
     History of Present Illness:  Patient is a 38 year old female who was evaluated by [place] in 2012 for abnormal liver function studies in the setting of alcohol abuse. Patient Continued to drink alcohol, she did not return for followup  Patient was hospitalized in March of this year at which time a CT scan raised suspicion of early pancreatitis as well as cirrhosis. No gastrointestinal consult was called during that admission. Patient continued to drink and was readmitted a few weeks ago with decompensated cirrhosis/ variceal bleeding for which she underwent banding. She comes in today with husband for a hospital followup.  Overall Antonette feels okay. No overt gastrointestinal bleeding. No significant abdominal pain. Unfortunately patient continues to drink alcohol though it does sound like she has cut back the amount. Corgard is causing dizziness, slow heart rate. BP 90/50 when checked at [employer] [facility].   Current Medications, Allergies, Past Medical History, Past Surgical History, Family History and Social History were reviewed in [employer] record.  Physical Exam: General: Pleasant, well developed , white female in no acute distress Head: Normocephalic and atraumatic Eyes:  sclerae anicteric, conjunctiva pink  Ears: Normal auditory acuity Lungs: Clear throughout to auscultation Heart: Regular rate and rhythm Abdomen: Soft, non distended, non-tender. No masses, no hepatomegaly. Normal bowel sounds Musculoskeletal: Symmetrical with no gross deformities  Extremities: No edema  Neurological: Alert oriented x 4, grossly nonfocal Psychological:  Alert and cooperative. Normal mood and affect  Assessment and Recommendations:   11. 38 year old female with cirrhosis, recently decompensated with variceal bleed.   Though not impossible, it is unusual that she has ETOH cirrhosis at only 38 year of age. I do not see where other causes of liver disease have been excluded. To  start with will check viral hepatitis studies, iron studies for hemochromatosis, and an ANA and ASMA.   May require vaccinations for hepatitis A and B. depending on lab results.   Decrease nadolol to 20mg  daily as she is not tolerating 40 mg. .  Schedule for repeat EGD with possible banding.   Stop alcohol. Lengthy discussion with patient and husband about the detrimental effects of alcohol on the liver, especially in the setting of cirrhosis. Patient sees a Social worker, she has seen a psychiatrist in the past. I strongly advised she follow up with a psychiatrist to get help with anxiety   2. Alcohol abuse. Patient drinks to cope with her anxiety. I recommended she get back in touch with psychiatrist for help. Patient is young enough that she will likely need a liver transplant at some point. She knows that abstinence is paramount in order to be a transplant candidate

## 2014-07-10 ENCOUNTER — Encounter: Payer: Self-pay | Admitting: Nurse Practitioner

## 2014-07-10 LAB — ANA: Anti Nuclear Antibody(ANA): NEGATIVE

## 2014-07-10 LAB — ANTI-SMOOTH MUSCLE ANTIBODY, IGG: Smooth Muscle Ab: 15 U (ref <20)

## 2014-07-11 LAB — CERULOPLASMIN: Ceruloplasmin: 29 mg/dL (ref 18–53)

## 2014-07-11 NOTE — Progress Notes (Signed)
Reviewed and agree with management. Robert D. Kaplan, M.D., FACG  

## 2014-07-12 ENCOUNTER — Emergency Department (HOSPITAL_COMMUNITY)
Admission: EM | Admit: 2014-07-12 | Discharge: 2014-07-12 | Discharge status: Against Medical Advice | Payer: 59 | Attending: Emergency Medicine | Admitting: Emergency Medicine

## 2014-07-12 ENCOUNTER — Encounter (HOSPITAL_COMMUNITY): Payer: Self-pay | Admitting: Emergency Medicine

## 2014-07-12 DIAGNOSIS — F1020 Alcohol dependence, uncomplicated: Secondary | ICD-10-CM | POA: Insufficient documentation

## 2014-07-12 DIAGNOSIS — Z8701 Personal history of pneumonia (recurrent): Secondary | ICD-10-CM | POA: Diagnosis not present

## 2014-07-12 DIAGNOSIS — R079 Chest pain, unspecified: Secondary | ICD-10-CM | POA: Diagnosis present

## 2014-07-12 DIAGNOSIS — F411 Generalized anxiety disorder: Secondary | ICD-10-CM | POA: Insufficient documentation

## 2014-07-12 DIAGNOSIS — K219 Gastro-esophageal reflux disease without esophagitis: Secondary | ICD-10-CM | POA: Diagnosis not present

## 2014-07-12 DIAGNOSIS — Z3202 Encounter for pregnancy test, result negative: Secondary | ICD-10-CM | POA: Insufficient documentation

## 2014-07-12 DIAGNOSIS — Z79899 Other long term (current) drug therapy: Secondary | ICD-10-CM | POA: Diagnosis not present

## 2014-07-12 DIAGNOSIS — IMO0001 Reserved for inherently not codable concepts without codable children: Secondary | ICD-10-CM

## 2014-07-12 LAB — COMPREHENSIVE METABOLIC PANEL
ALBUMIN: 3.7 g/dL (ref 3.5–5.2)
ALK PHOS: 114 U/L (ref 39–117)
ALT: 49 U/L — ABNORMAL HIGH (ref 0–35)
ANION GAP: 17 — AB (ref 5–15)
AST: 108 U/L — ABNORMAL HIGH (ref 0–37)
BUN: 4 mg/dL — AB (ref 6–23)
CO2: 21 mEq/L (ref 19–32)
Calcium: 9.1 mg/dL (ref 8.4–10.5)
Chloride: 107 mEq/L (ref 96–112)
Creatinine, Ser: 0.43 mg/dL — ABNORMAL LOW (ref 0.50–1.10)
GFR calc Af Amer: >90 mL/min (ref >90)
GFR calc non Af Amer: >90 mL/min (ref >90)
Glucose, Bld: 114 mg/dL — ABNORMAL HIGH (ref 70–99)
Potassium: 4 mEq/L (ref 3.7–5.3)
SODIUM: 145 meq/L (ref 137–147)
TOTAL PROTEIN: 8.7 g/dL — AB (ref 6.0–8.3)
Total Bilirubin: 1.1 mg/dL (ref 0.3–1.2)

## 2014-07-12 LAB — RAPID URINE DRUG SCREEN, HOSP PERFORMED
AMPHETAMINES: NOT DETECTED
BARBITURATES: NOT DETECTED
Benzodiazepines: NOT DETECTED
COCAINE: NOT DETECTED
OPIATES: NOT DETECTED
TETRAHYDROCANNABINOL: NOT DETECTED

## 2014-07-12 LAB — CBC
HCT: 34.6 % — ABNORMAL LOW (ref 36.0–46.0)
HEMOGLOBIN: 11.6 g/dL — AB (ref 12.0–15.0)
MCH: 30.4 pg (ref 26.0–34.0)
MCHC: 33.5 g/dL (ref 30.0–36.0)
MCV: 90.6 fL (ref 78.0–100.0)
Platelets: 145 10*3/uL — ABNORMAL LOW (ref 150–400)
RBC: 3.82 MIL/uL — ABNORMAL LOW (ref 3.87–5.11)
RDW: 14 % (ref 11.5–15.5)
WBC: 9 10*3/uL (ref 4.0–10.5)

## 2014-07-12 LAB — I-STAT TROPONIN, ED: TROPONIN I, POC: 0 ng/mL (ref 0.00–0.08)

## 2014-07-12 LAB — POC URINE PREG, ED: Preg Test, Ur: NEGATIVE

## 2014-07-12 LAB — ETHANOL: Alcohol, Ethyl (B): 309 mg/dL — ABNORMAL HIGH (ref 0–11)

## 2014-07-12 MED ORDER — LOPERAMIDE HCL 2 MG PO CAPS: 2.0000 mg | ORAL_CAPSULE | ORAL | Status: DC | PRN

## 2014-07-12 MED ORDER — CHLORDIAZEPOXIDE HCL 25 MG PO CAPS: 25.0000 mg | ORAL_CAPSULE | ORAL | Status: DC

## 2014-07-12 MED ORDER — THIAMINE HCL 100 MG/ML IJ SOLN
100.0000 mg | Freq: Once | INTRAMUSCULAR | Status: AC
  Administered 2014-07-12: 100 mg via INTRAMUSCULAR
  Filled 2014-07-12: qty 2

## 2014-07-12 MED ORDER — HYDROXYZINE HCL 25 MG PO TABS: 25.0000 mg | ORAL_TABLET | Freq: Four times a day (QID) | ORAL | Status: DC | PRN

## 2014-07-12 MED ORDER — CHLORDIAZEPOXIDE HCL 25 MG PO CAPS: 25.0000 mg | ORAL_CAPSULE | Freq: Four times a day (QID) | ORAL | Status: DC | PRN

## 2014-07-12 MED ORDER — VITAMIN B-1 100 MG PO TABS: 100.0000 mg | ORAL_TABLET | Freq: Every day | ORAL | Status: DC

## 2014-07-12 MED ORDER — ONDANSETRON 4 MG PO TBDP: 4.0000 mg | ORAL_TABLET | Freq: Four times a day (QID) | ORAL | Status: DC | PRN

## 2014-07-12 MED ORDER — ADULT MULTIVITAMIN W/MINERALS CH: 1.0000 | ORAL_TABLET | Freq: Every day | ORAL | Status: DC

## 2014-07-12 MED ORDER — CHLORDIAZEPOXIDE HCL 25 MG PO CAPS: 25.0000 mg | ORAL_CAPSULE | Freq: Every day | ORAL | Status: DC

## 2014-07-12 MED ORDER — THIAMINE HCL 100 MG/ML IJ SOLN: 100.0000 mg | Freq: Every day | INTRAMUSCULAR | Status: DC

## 2014-07-12 MED ORDER — CHLORDIAZEPOXIDE HCL 25 MG PO CAPS
25.0000 mg | ORAL_CAPSULE | Freq: Four times a day (QID) | ORAL | Status: DC
  Filled 2014-07-12: qty 1

## 2014-07-12 MED ORDER — CHLORDIAZEPOXIDE HCL 25 MG PO CAPS: 25.0000 mg | ORAL_CAPSULE | Freq: Three times a day (TID) | ORAL | Status: DC

## 2014-07-12 NOTE — ED Notes (Signed)
Pt has not been in room for over an hour, believed to have left the hospital

## 2014-07-12 NOTE — ED Provider Notes (Signed)
CSN: [id number]     Arrival date & time 07/12/14  1732 History   First MD Initiated Contact with Patient 07/12/14 2024     Chief Complaint  Patient presents with  . Chest Pain  . Alcohol Problem     (Consider location/radiation/quality/duration/timing/severity/associated sxs/prior Treatment) HPI 38 year old female with alcoholism cirrhosis history of esophageal varices with esophageal variceal bleed within the last 2 months now presents with request for alcohol detox with no suicidal ideation no homicidal ideation no hallucinations the patient's last alcohol intake early this afternoon but the patient already feels jittery and shaky without vomiting without sweats without confusion and she has no vomiting no abdominal pain no bloody stools but for the last 5 days has had a very minimal intermittent lower chest discomfort lasting about an hour time nonexertional nonpleuritic pain without radiation or associated symptoms such as cough shortness of breath lightheadedness or other concerns. She is taking Protonix. Her last admission for detox was less than a year ago and she was sober for a few months but has now been drinking for a few months again. She states she did have some hallucinations when she had withdrawal previously but does not recall having a seizure.  Past Medical History  Diagnosis Date  . Depression   . Pneumonia   . Cirrhosis   . Portal hypertension   . S/P alcohol detoxification     2-3 days at behavioral health previously  . Alcoholism   . Anxiety   . GERD (gastroesophageal reflux disease)   . Esophageal varices with bleeding(456.0) 06/13/2014   Past Surgical History  Procedure Laterality Date  . Cholecystectomy    . Esophagogastroduodenoscopy N/A 06/12/2014    Procedure: ESOPHAGOGASTRODUODENOSCOPY (EGD);  Surgeon: Gatha Mayer, MD;  Location: [facility];  Service: Endoscopy;  Laterality: N/A;   Family History  Problem Relation Age of Onset  . Colon polyps Mother    . Hypertension Mother   . Thyroid disease Mother   . Alcoholism Mother   . Alcoholism Father   . Alcohol abuse Maternal Grandfather   . Alcohol abuse Paternal Grandfather   . Alcohol abuse Paternal Aunt   . Alcohol abuse Maternal Uncle    History  Substance Use Topics  . Smoking status: Never Smoker   . Smokeless tobacco: Never Used  . Alcohol Use: 42.0 oz/week    70 Glasses of wine per week     Comment: reports 3 bottle of wine and pint of or more of vodka daily. 2 bottles of wine currently and pint of vodka some days for at least 4-5 days.a month.     OB History   Grav Para Term Preterm Abortions TAB SAB Ect Mult Living            2     Review of Systems 10 Systems reviewed and are negative for acute change except as noted in the HPI.   Allergies  Morphine and related; Morphine and related; and Nsaids  Home Medications   Prior to Admission medications   Medication Sig Start Date End Date Taking? Authorizing Provider  folic acid (FOLVITE) 1 MG tablet Take 1 mg by mouth daily.   Yes Historical Provider, MD  Multiple Vitamin (MULTIVITAMIN WITH MINERALS) TABS tablet Take 1 tablet by mouth daily. For low vitamin 02/05/14  Yes Encarnacion Slates, NP  nadolol (CORGARD) 40 MG tablet Take 1 tablet (40 mg total) by mouth daily. 06/16/14  Yes Nishant Dhungel, MD  pantoprazole (PROTONIX) 40 MG tablet  Take 40 mg by mouth daily.   Yes Historical Provider, MD   BP 105/62  Pulse 78  Temp(Src) 99 F (37.2 C) (Oral)  Resp 16  SpO2 96% Physical Exam  Nursing note and vitals reviewed. Constitutional:  Awake, alert, nontoxic appearance.  HENT:  Head: Atraumatic.  Eyes: Right eye exhibits no discharge. Left eye exhibits no discharge.  Neck: Neck supple.  Cardiovascular: Normal rate and regular rhythm.   No murmur heard. Pulmonary/Chest: Effort normal and breath sounds normal. No respiratory distress. She has no wheezes. She has no rales. She exhibits no tenderness.  Abdominal: Soft.  Bowel sounds are normal. She exhibits no distension and no mass. There is no tenderness. There is no rebound and no guarding.  Musculoskeletal: She exhibits no tenderness.  Baseline ROM, no obvious new focal weakness.  Neurological: She is alert.  Mental status and motor strength appears baseline for patient and situation.  Skin: No rash noted.  Psychiatric: She has a normal mood and affect.    ED Course  Procedures (including critical care time) Patient clear speech upon arrival and does not appear intoxicated clinically. TTS recs inpatient psych detox; looking for Psych placement.  RN reports Pt eloped. [facility] Reviewed  COMPREHENSIVE METABOLIC PANEL - Abnormal; Notable for the following:    Glucose, Bld 114 (*)    BUN 4 (*)    Creatinine, Ser 0.43 (*)    Total Protein 8.7 (*)    AST 108 (*)    ALT 49 (*)    Anion gap 17 (*)    All other components within normal limits  CBC - Abnormal; Notable for the following:    RBC 3.82 (*)    Hemoglobin 11.6 (*)    HCT 34.6 (*)    Platelets 145 (*)    All other components within normal limits  ETHANOL - Abnormal; Notable for the following:    Alcohol, Ethyl (B) 309 (*)    All other components within normal limits  URINE RAPID DRUG SCREEN (HOSP PERFORMED)  I-STAT TROPOININ, ED  POC URINE PREG, ED    Imaging Review No results found.   EKG Interpretation   Date/Time:  Saturday July 12 2014 17:45:40 EDT Ventricular Rate:  93 PR Interval:  168 QRS Duration: 89 QT Interval:  399 QTC Calculation: 496 R Axis:   32 Text Interpretation:  Sinus rhythm Borderline prolonged QT interval ED  PHYSICIAN INTERPRETATION AVAILABLE IN [facility] Confirmed by TEST,  Record ([id number]) on 07/14/2014 7:29:32 AM      MDM   Final diagnoses:  Alcoholism /alcohol abuse   Pt eloped.    Babette Relic, MD 07/25/14 [phone]
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
## 2014-07-12 NOTE — ED Notes (Signed)
Per pt, has hx of esophageal varices and cirrhosis of liver.  Pt has large etoh hx.  Pt here today with chest discomfort in epigastric region.  Pt states no vomiting.  No black stools.  No abdominal pain.  Pt wants help with alcohol also.  Last drink today.  States she drinks 3 bottles of wine per day and has drank heavy for years.

## 2014-07-17 ENCOUNTER — Encounter: Payer: Self-pay | Admitting: *Deleted

## 2014-07-18 ENCOUNTER — Encounter (HOSPITAL_COMMUNITY): Payer: Self-pay | Admitting: Emergency Medicine

## 2014-07-18 ENCOUNTER — Emergency Department (HOSPITAL_COMMUNITY): Payer: PRIVATE HEALTH INSURANCE

## 2014-07-18 ENCOUNTER — Emergency Department (HOSPITAL_COMMUNITY)
Admission: EM | Admit: 2014-07-18 | Discharge: 2014-07-18 | Disposition: A | Discharge status: Home / Self Care | Payer: Self-pay | Attending: Emergency Medicine | Admitting: Emergency Medicine

## 2014-07-18 DIAGNOSIS — Y939 Activity, unspecified: Secondary | ICD-10-CM | POA: Insufficient documentation

## 2014-07-18 DIAGNOSIS — Z79899 Other long term (current) drug therapy: Secondary | ICD-10-CM | POA: Insufficient documentation

## 2014-07-18 DIAGNOSIS — Z8701 Personal history of pneumonia (recurrent): Secondary | ICD-10-CM | POA: Insufficient documentation

## 2014-07-18 DIAGNOSIS — D696 Thrombocytopenia, unspecified: Secondary | ICD-10-CM | POA: Insufficient documentation

## 2014-07-18 DIAGNOSIS — Y9289 Other specified places as the place of occurrence of the external cause: Secondary | ICD-10-CM | POA: Insufficient documentation

## 2014-07-18 DIAGNOSIS — R296 Repeated falls: Secondary | ICD-10-CM | POA: Insufficient documentation

## 2014-07-18 DIAGNOSIS — W19XXXA Unspecified fall, initial encounter: Secondary | ICD-10-CM

## 2014-07-18 DIAGNOSIS — R404 Transient alteration of awareness: Secondary | ICD-10-CM | POA: Insufficient documentation

## 2014-07-18 DIAGNOSIS — F411 Generalized anxiety disorder: Secondary | ICD-10-CM | POA: Insufficient documentation

## 2014-07-18 DIAGNOSIS — F10229 Alcohol dependence with intoxication, unspecified: Secondary | ICD-10-CM | POA: Insufficient documentation

## 2014-07-18 DIAGNOSIS — K219 Gastro-esophageal reflux disease without esophagitis: Secondary | ICD-10-CM | POA: Insufficient documentation

## 2014-07-18 DIAGNOSIS — Z8679 Personal history of other diseases of the circulatory system: Secondary | ICD-10-CM | POA: Insufficient documentation

## 2014-07-18 LAB — CBC WITH DIFFERENTIAL/PLATELET
Basophils Absolute: 0 10*3/uL (ref 0.0–0.1)
Basophils Relative: 0 % (ref 0–1)
Eosinophils Absolute: 0 10*3/uL (ref 0.0–0.7)
Eosinophils Relative: 0 % (ref 0–5)
HCT: 31.1 % — ABNORMAL LOW (ref 36.0–46.0)
Hemoglobin: 10.3 g/dL — ABNORMAL LOW (ref 12.0–15.0)
Lymphocytes Relative: 28 % (ref 12–46)
Lymphs Abs: 2.6 10*3/uL (ref 0.7–4.0)
MCH: 31.1 pg (ref 26.0–34.0)
MCHC: 33.1 g/dL (ref 30.0–36.0)
MCV: 94 fL (ref 78.0–100.0)
Monocytes Absolute: 0.6 10*3/uL (ref 0.1–1.0)
Monocytes Relative: 7 % (ref 3–12)
Neutro Abs: 5.8 10*3/uL (ref 1.7–7.7)
Neutrophils Relative %: 64 % (ref 43–77)
Platelets: 64 10*3/uL — ABNORMAL LOW (ref 150–400)
RBC: 3.31 MIL/uL — ABNORMAL LOW (ref 3.87–5.11)
RDW: 15.5 % (ref 11.5–15.5)
WBC: 9 10*3/uL (ref 4.0–10.5)

## 2014-07-18 LAB — URINALYSIS, ROUTINE W REFLEX MICROSCOPIC
Bilirubin Urine: NEGATIVE
Glucose, UA: NEGATIVE mg/dL
Ketones, ur: NEGATIVE mg/dL
Leukocytes, UA: NEGATIVE
Nitrite: NEGATIVE
Protein, ur: NEGATIVE mg/dL
Specific Gravity, Urine: 1.008 (ref 1.005–1.030)
Urobilinogen, UA: 1 mg/dL (ref 0.0–1.0)
pH: 6 (ref 5.0–8.0)

## 2014-07-18 LAB — COMPREHENSIVE METABOLIC PANEL
ALT: 33 U/L (ref 0–35)
AST: 84 U/L — ABNORMAL HIGH (ref 0–37)
Albumin: 3.3 g/dL — ABNORMAL LOW (ref 3.5–5.2)
Alkaline Phosphatase: 100 U/L (ref 39–117)
Anion gap: 15 (ref 5–15)
BUN: 4 mg/dL — ABNORMAL LOW (ref 6–23)
CO2: 22 mEq/L (ref 19–32)
Calcium: 8.2 mg/dL — ABNORMAL LOW (ref 8.4–10.5)
Chloride: 106 mEq/L (ref 96–112)
Creatinine, Ser: 0.37 mg/dL — ABNORMAL LOW (ref 0.50–1.10)
GFR calc Af Amer: >90 mL/min (ref >90)
GFR calc non Af Amer: >90 mL/min (ref >90)
Glucose, Bld: 105 mg/dL — ABNORMAL HIGH (ref 70–99)
Potassium: 3.9 mEq/L (ref 3.7–5.3)
Sodium: 143 mEq/L (ref 137–147)
Total Bilirubin: 1 mg/dL (ref 0.3–1.2)
Total Protein: 7.3 g/dL (ref 6.0–8.3)

## 2014-07-18 LAB — URINE MICROSCOPIC-ADD ON

## 2014-07-18 LAB — PROTIME-INR
INR: 1.37 (ref 0.00–1.49)
Prothrombin Time: 16.9 seconds — ABNORMAL HIGH (ref 11.6–15.2)

## 2014-07-18 MED ORDER — FENTANYL CITRATE 0.05 MG/ML IJ SOLN
25.0000 ug | Freq: Once | INTRAMUSCULAR | Status: AC
  Administered 2014-07-18: 25 ug via INTRAVENOUS
  Filled 2014-07-18: qty 2

## 2014-07-18 MED ORDER — SODIUM CHLORIDE 0.9 % IV BOLUS (SEPSIS)
1000.0000 mL | Freq: Once | INTRAVENOUS | Status: AC
  Administered 2014-07-18: 1000 mL via INTRAVENOUS

## 2014-07-18 MED ORDER — ONDANSETRON HCL 4 MG/2ML IJ SOLN
4.0000 mg | Freq: Once | INTRAMUSCULAR | Status: AC
  Administered 2014-07-18: 4 mg via INTRAVENOUS
  Filled 2014-07-18: qty 2

## 2014-07-18 NOTE — ED Notes (Signed)
Pt to ED via [facility] after reported being found on floor by her mother.  Pt has dried blood in left nostril.  No active bleeding at this time,  Pt admits to a bottle of wine today.  Pt denies any pain or injuries

## 2014-07-18 NOTE — Discharge Instructions (Signed)
Alcohol Intoxication °Alcohol intoxication occurs when the amount of alcohol that a person has consumed impairs his or her ability to mentally and physically function. Alcohol directly impairs the normal chemical activity of the brain. Drinking large amounts of alcohol can lead to changes in mental function and behavior, and it can cause many physical effects that can be harmful.  °Alcohol intoxication can range in severity from mild to very severe. Various factors can affect the level of intoxication that occurs, such as the person's age, gender, weight, frequency of alcohol consumption, and the presence of other medical conditions (such as diabetes, seizures, or heart conditions). Dangerous levels of alcohol intoxication may occur when people drink large amounts of alcohol in a short period (binge drinking). Alcohol can also be especially dangerous when combined with certain prescription medicines or "recreational" drugs. °SIGNS AND SYMPTOMS °Some common signs and symptoms of mild alcohol intoxication include: °· Loss of coordination. °· Changes in mood and behavior. °· Impaired judgment. °· Slurred speech. °As alcohol intoxication progresses to more severe levels, other signs and symptoms will appear. These may include: °· Vomiting. °· Confusion and impaired memory. °· Slowed breathing. °· Seizures. °· Loss of consciousness. °DIAGNOSIS  °Your health care provider will take a medical history and perform a physical exam. You will be asked about the amount and type of alcohol you have consumed. Blood tests will be done to measure the concentration of alcohol in your blood. In many places, your blood alcohol level must be lower than 80 mg/dL (0.08%) to legally drive. However, many dangerous effects of alcohol can occur at much lower levels.  °TREATMENT  °People with alcohol intoxication often do not require treatment. Most of the effects of alcohol intoxication are temporary, and they go away as the alcohol naturally  leaves the body. Your health care provider will monitor your condition until you are stable enough to go home. Fluids are sometimes given through an IV access tube to help prevent dehydration.  °HOME CARE INSTRUCTIONS °· Do not drive after drinking alcohol. °· Stay hydrated. Drink enough water and fluids to keep your urine clear or pale yellow. Avoid caffeine.   °· Only take over-the-counter or prescription medicines as directed by your health care provider.   °SEEK MEDICAL CARE IF:  °· You have persistent vomiting.   °· You do not feel better after a few days. °· You have frequent alcohol intoxication. Your health care provider can help determine if you should see a substance use treatment counselor. °SEEK IMMEDIATE MEDICAL CARE IF:  °· You become shaky or tremble when you try to stop drinking.   °· You shake uncontrollably (seizure).   °· You throw up (vomit) blood. This may be bright red or may look like black coffee grounds.   °· You have blood in your stool. This may be bright red or may appear as a black, tarry, bad smelling stool.   °· You become lightheaded or faint.   °MAKE SURE YOU:  °· Understand these instructions. °· Will watch your condition. °· Will get help right away if you are not doing well or get worse. °Document Released: 08/17/2005 Document Revised: 07/10/2013 Document Reviewed: 04/12/2013 °ExitCare® Patient Information ©2015 [employer]. This information is not intended to replace advice given to you by your health care provider. Make sure you discuss any questions you have with your health care provider. ° °Alcohol Use Disorder °Alcohol use disorder is a mental disorder. It is not a one-time incident of heavy drinking. Alcohol use disorder is the excessive and   uncontrollable use of alcohol over time that leads to problems with functioning in one or more areas of daily living. People with this disorder risk harming themselves and others when they drink to excess. Alcohol use disorder also  can cause other mental disorders, such as mood and anxiety disorders, and serious physical problems. People with alcohol use disorder often misuse other drugs.  Alcohol use disorder is common and widespread. Some people with this disorder drink alcohol to cope with or escape from negative life events. Others drink to relieve chronic pain or symptoms of mental illness. People with a family history of alcohol use disorder are at higher risk of losing control and using alcohol to excess.  SYMPTOMS  Signs and symptoms of alcohol use disorder may include the following:   Consumption ofalcohol inlarger amounts or over a longer period of time than intended.  Multiple unsuccessful attempts to cutdown or control alcohol use.   A great deal of time spent obtaining alcohol, using alcohol, or recovering from the effects of alcohol (hangover).  A strong desire or urge to use alcohol (cravings).   Continued use of alcohol despite problems at work, school, or home because of alcohol use.   Continued use of alcohol despite problems in relationships because of alcohol use.  Continued use of alcohol in situations when it is physically hazardous, such as driving a car.  Continued use of alcohol despite awareness of a physical or psychological problem that is likely related to alcohol use. Physical problems related to alcohol use can involve the brain, heart, liver, stomach, and intestines. Psychological problems related to alcohol use include intoxication, depression, anxiety, psychosis, delirium, and dementia.   The need for increased amounts of alcohol to achieve the same desired effect, or a decreased effect from the consumption of the same amount of alcohol (tolerance).  Withdrawal symptoms upon reducing or stopping alcohol use, or alcohol use to reduce or avoid withdrawal symptoms. Withdrawal symptoms include:  Racing heart.  Hand tremor.  Difficulty  sleeping.  Nausea.  Vomiting.  Hallucinations.  Restlessness.  Seizures. DIAGNOSIS Alcohol use disorder is diagnosed through an assessment by your health care provider. Your health care provider may start by asking three or four questions to screen for excessive or problematic alcohol use. To confirm a diagnosis of alcohol use disorder, at least two symptoms must be present within a 106-monthperiod. The severity of alcohol use disorder depends on the number of symptoms:  Mild--two or three.  Moderate--four or five.  Severe--six or more. Your health care provider may perform a physical exam or use results from lab tests to see if you have physical problems resulting from alcohol use. Your health care provider may refer you to a mental health professional for evaluation. TREATMENT  Some people with alcohol use disorder are able to reduce their alcohol use to low-risk levels. Some people with alcohol use disorder need to quit drinking alcohol. When necessary, mental health professionals with specialized training in substance use treatment can help. Your health care provider can help you decide how severe your alcohol use disorder is and what type of treatment you need. The following forms of treatment are available:   Detoxification. Detoxification involves the use of prescription medicines to prevent alcohol withdrawal symptoms in the first week after quitting. This is important for people with a history of symptoms of withdrawal and for heavy drinkers who are likely to have withdrawal symptoms. Alcohol withdrawal can be dangerous and, in severe cases, cause death. Detoxification  is usually provided in a hospital or in-patient substance use treatment facility.  Counseling or talk therapy. Talk therapy is provided by substance use treatment counselors. It addresses the reasons people use alcohol and ways to keep them from drinking again. The goals of talk therapy are to help people with alcohol  use disorder find healthy activities and ways to cope with life stress, to identify and avoid triggers for alcohol use, and to handle cravings, which can cause relapse.  Medicines.Different medicines can help treat alcohol use disorder through the following actions:  Decrease alcohol cravings.  Decrease the positive reward response felt from alcohol use.  Produce an uncomfortable physical reaction when alcohol is used (aversion therapy).  Support groups. Support groups are run by people who have quit drinking. They provide emotional support, advice, and guidance. These forms of treatment are often combined. Some people with alcohol use disorder benefit from intensive combination treatment provided by specialized substance use treatment centers. Both inpatient and outpatient treatment programs are available. Document Released: 12/15/2004 Document Revised: 03/24/2014 Document Reviewed: 02/14/2013 [facility] Patient Information 2015 [place], [place]. This information is not intended to replace advice given to you by your health care provider. Make sure you discuss any questions you have with your health care provider.  Thrombocytopenia Thrombocytopenia is a condition in which there is an abnormally small number of platelets in your blood. Platelets are also called thrombocytes. Platelets are needed for blood clotting. CAUSES Thrombocytopenia is caused by:   Decreased production of platelets. This can be caused by:  Aplastic anemia in which your bone marrow quits making blood cells.  Cancer in the bone marrow.  Use of certain medicines, including chemotherapy.  Infection in the bone marrow.  Heavy alcohol consumption.  Increased destruction of platelets. This can be caused by:  Certain immune diseases.  Use of certain drugs.  Certain blood clotting disorders.  Certain inherited disorders.  Certain bleeding disorders.  Pregnancy.  Having an enlarged spleen (hypersplenism). In  hypersplenism, the spleen gathers up platelets from circulation. This means the platelets are not available to help with blood clotting. The spleen can enlarge due to cirrhosis or other conditions. SYMPTOMS  The symptoms of thrombocytopenia are side effects of poor blood clotting. Some of these are:  Abnormal bleeding.  Nosebleeds.  Heavy menstrual periods.  Blood in the urine or stools.  Purpura. This is a purplish discoloration in the skin produced by small bleeding vessels near the surface of the skin.  Bruising.  A rash that may be petechial. This looks like pinpoint, purplish-red spots on the skin and mucous membranes. It is caused by bleeding from small blood vessels (capillaries). DIAGNOSIS  Your caregiver will make this diagnosis based on your exam and blood tests. Sometimes, a bone marrow study is done to look for the original cells (megakaryocytes) that make platelets. TREATMENT  Treatment depends on the cause of the condition.  Medicines may be given to help protect your platelets from being destroyed.  In some cases, a replacement (transfusion) of platelets may be required to stop or prevent bleeding.  Sometimes, the spleen must be surgically removed. HOME CARE INSTRUCTIONS   Check the skin and linings inside your mouth for bruising or bleeding as directed by your caregiver.  Check your sputum, urine, and stool for blood as directed by your caregiver.  Do not return to any activities that could cause bumps or bruises until your caregiver says it is okay.  Take extra care not to cut yourself when  shaving or when using scissors, needles, knives, and other tools.  Take extra care not to burn yourself when ironing or cooking.  Ask your caregiver if it is okay for you to drink alcohol.  Only take over-the-counter or prescription medicines as directed by your caregiver.  Notify all your caregivers, including dentists and eye doctors, about your condition. SEEK  IMMEDIATE MEDICAL CARE IF:   You develop active bleeding from anywhere in your body.  You develop unexplained bruising or bleeding.  You have blood in your sputum, urine, or stool. MAKE SURE YOU:  Understand these instructions.  Will watch your condition.  Will get help right away if you are not doing well or get worse. Document Released: 11/07/2005 Document Revised: 01/30/2012 Document Reviewed: 09/09/2011 [facility] Patient Information 2015 [place], [place]. This information is not intended to replace advice given to you by your health care provider. Make sure you discuss any questions you have with your health care provider.   Emergency Department Resource Guide 1) Find a Doctor and Pay Out of Pocket Although you won't have to find out who is covered by your insurance plan, it is a good idea to ask around and get recommendations. You will then need to call the office and see if the doctor you have chosen will accept you as a new patient and what types of options they offer for patients who are self-pay. Some doctors offer discounts or will set up payment plans for their patients who do not have insurance, but you will need to ask so you aren't surprised when you get to your appointment.  2) Contact Your Local Health Department Not all health departments have doctors that can see patients for sick visits, but many do, so it is worth a call to see if yours does. If you don't know where your local health department is, you can check in your phone book. The CDC also has a tool to help you locate your state's health department, and many state websites also have listings of all of their local health departments.  3) Find a House Clinic If your illness is not likely to be very severe or complicated, you may want to try a walk in clinic. These are popping up all over the country in pharmacies, drugstores, and shopping centers. They're usually staffed by nurse practitioners or physician assistants  that have been trained to treat common illnesses and complaints. They're usually fairly quick and inexpensive. However, if you have serious medical issues or chronic medical problems, these are probably not your best option.  No Primary Care Doctor: - Call Health Connect at  [phone] - they can help you locate a primary care doctor that  accepts your insurance, provides certain services, etc. - Physician Referral Service- [phone]  Chronic Pain Problems: Organization         Address  Phone   Notes  [facility]  [phone] Patients need to be referred by their primary care doctor.   Medication Assistance: Organization         Address  Phone   Notes  [facility] Medication [facility] [address] [place], [place] [address] [phone] --Must be a resident of [facility] -- Must have NO insurance coverage whatsoever (no Medicaid/ Medicare, etc.) -- The pt. MUST have a primary care doctor that directs their care regularly and follows them in the community   MedAssist  [phone]   [address]  [phone]  Agencies that provide inexpensive medical care: Organization         Address  Phone   Notes  [place]  [phone]   [facility]    [phone]   [facility] [place], [place] [address] [phone]   [place] [address] [place] [phone]   Planned Parenthood    [phone]   [facility]    [phone]   [place] [address], [place] Phone:  [phone], Fax:  [phone] Hours of Operation:  9 am - 6 pm, M-F.  Also accepts Medicaid/Medicare and self-pay.  [facility] for [place] [address], [address], [place] Phone: [phone], Fax: [phone]. Hours of Operation:  8:30 am - 5:30 pm, M-F.  Also accepts Medicaid  and self-pay.  [facility] [address], [place] Phone: [phone]   [place], [place], [place] [phone], Ext. [phone] Mondays & Thursdays: 7-9 AM.  First 15 patients are seen on a first come, first serve basis.    [place] Providers:  Organization         Address  Phone   Notes  [facility] [address], [place] [phone] Also accepts self-pay patients.  [facility] [address], [place]  [phone]   [place], [address], [place] [phone]   [facility] [address], [place] [phone]   Lucianne Lei [address], [place]   [phone] Only accepts [place] Access [place] patients after they have their name applied to their card.   Self-Pay (no insurance) in [facility]:  Organization         Address  Phone   Notes  Sickle Cell Patients, [facility] Internal Medicine [place] [phone]   [facility] Urgent Care [place] [phone]   [facility] [place]  [place], [place], [place] [phone]   [facility]/Dr. Osei-Bonsu  [address] [place] or [address], [place] [phone] Phone number for both [place] and [place] locations is the same.  [facility] [address] [place] [phone]   [facility] [address], [place] or [address] [phone]   [facility] [address] [place] [phone], phone; [phone], fax Sees patients 1st and 3rd Saturday of every month.  Must not qualify for public or private insurance (i.e. Medicaid, Medicare, [place] Health Choice, Veterans' Benefits)  Household income  should be no more than 200% of the poverty level The clinic cannot treat you if you are pregnant or think you are pregnant  Sexually transmitted diseases are not treated at the clinic.    Dental Care: Organization         Address  Phone  Notes  [facility] Department of [facility] [place] [phone] Accepts children up to age 64 who are enrolled in [place] or [place]; pregnant women with a Medicaid card; and children who have applied for Medicaid or  [place] Health Choice, but were declined, whose parents can pay a reduced fee at time of service.  [facility] Department of [facility]  [address], [place] [phone] Accepts children up to age 63 who are enrolled in [place] or [place]; pregnant women with a Medicaid card; and children who have applied for Medicaid or [place] Health Choice, but were declined, whose parents can pay a reduced fee at time of service.  [place] Adult Dental Access PROGRAM  [place] [phone] Patients are seen by appointment only. Walk-ins are not accepted. [place] will see patients 88 years of age and older. Monday - Tuesday (8am-5pm) Most Wednesdays (8:30-5pm) $30 per visit, cash only  [facility] Adult Dental Access PROGRAM  [address], [facility] [phone] Patients are seen by appointment only. Walk-ins are not accepted. [place] will see patients 57 years of age and older. One Wednesday Evening (Monthly: Volunteer Based).  $30 per visit, cash only  [place]  [phone] for adults; Children under age 32, call Graduate Pediatric Dentistry at [phone]. Children aged 32-14, please call [phone] to request a pediatric application.  Dental services are provided in all areas of dental care including fillings, crowns and bridges, complete and partial dentures, implants, gum treatment,  root canals, and extractions. Preventive care is also provided. Treatment is provided to both adults and children. Patients are selected via a lottery and there is often a waiting list.   [facility] [address] [place]  [phone] [email or web address]   [facility] [address] [place], [place] [phone], Ext. [phone] Second and Fourth Thursday of each month, opens at 6:30 AM; Clinic ends at 9 AM.  Patients are seen on a first-come first-served basis, and a limited number are seen during each clinic.   [facility]  [address] Hillard Danker [place], [place] [phone]   Eligibility Requirements You must have lived in [place], [place], or [place] counties for at least the last three months.   You cannot be eligible for state or federal sponsored [employer], including [employer], [place], or [employer].   You generally cannot be eligible for healthcare insurance through your employer.    How to apply: Eligibility screenings are held every Tuesday and Wednesday afternoon from 1:00 pm until 4:00 pm. You do not need an appointment for the interview!  [facility] [address] [place], [place]   [place]  [place] Department  [place]  [phone]    Behavioral Health Resources in the Community: Intensive Outpatient Programs Organization         Address  Phone  Notes  [place] [address], [place], [place] [phone]   [facility] Outpatient [address], [place], [place]   ADS: Alcohol [facility] [address] [place], [place]   [place] [address],  [place], [place] or [phone]   Substance Abuse Resources Organization         Address  Phone  Notes  Alcohol and Drug Services   [phone]   [facility]  [phone]   [facility] [place]  [phone]   [place]a Pester  [phone]   Residential & Outpatient Substance Abuse Program  [phone]  Psychological Services Organization         Address  Phone  Notes  [facility] [place]  [place]  [phone]   [place] [address] [place] or [phone]    Mobile Crisis Teams Organization         Address  Phone  Notes  [facility], Mobile Crisis Care Unit  [phone]   [facility]  [address] [place], [place]   [place] [address] [place] [phone]    Self-Help/Support Groups Organization         Address  Phone             Notes  [place] of [place] - variety of support groups  [place] Call for more information  Narcotics Anonymous (NA), [facility] [address], [employer] Welcome  2 meetings at this location   Special educational needs teacher         Address  Phone  Notes  [facility] [place],    [place]  [phone]   [facility]  [address] [place] [address]0109, [place], [place]   [place], [place] [phone] Admissions: 8am-3pm M-F  Incentives Substance [place] [address]    [place], [place] [phone]   [facility] [address] [place], [place], [place]   [facility] [address]  [place], [place]   [facility] - Intensive Outpatient [address]een [address], [place], [place]   [facility].,  [place], [place] [phone] or [phone]   [facility] ([facility]) [address] [place], [place] Accepts Medicaid  [place] [address]  [place] [phone] Substance Abuse/Addiction Treatment   [facility] Organization         Address  Phone  Notes  [facility]  [phone]   Domenic Schwab, PhD [address] Arlis [place], [place]   [phone] or [phone]   [place], [place] [phone]   [facility] [address] [place], [place] [phone] Insurance/Medicaid/sponsorship through [facility] and Families [address], [address] [place]                                    [place], [place] [phone] [place] [address], [place] [phone]    Dr. Adele Schilder  [phone]   [facility] Dept. 1) [address] [place] 2) [place] 3)  [place] [address], [place] [phone]  [phone]   [place] [phone] or [phone] (After Hours)

## 2014-07-18 NOTE — ED Notes (Signed)
Pt to CT at this time.

## 2014-07-18 NOTE — ED Notes (Signed)
Went into pt's room to give discharge papers, pt and husband were not there.

## 2014-07-23 NOTE — ED Provider Notes (Signed)
CSN: [id number]     Arrival date & time 07/18/14  1653 History   First MD Initiated Contact with Patient 07/18/14 1712     Chief Complaint  Patient presents with  . Loss of Consciousness     (Consider location/radiation/quality/duration/timing/severity/associated sxs/prior Treatment) HPI  38 year old female presenting after she was found laying on the floor by her family. She states that she lost her balance and fell. She has a history of heavy alcohol abuse. She is currently intoxicated. Complications a frontal abuse including cirrhosis and esophageal varices. Denies any hematemesis, bright red blood per rectum or melena. No abdominal pain. No vomiting. She's not interested in seeking help her a whole abuse at this time. She is requesting pain medication.  Past Medical History  Diagnosis Date  . Depression   . Pneumonia   . Cirrhosis   . Portal hypertension   . S/P alcohol detoxification     2-3 days at behavioral health previously  . Alcoholism   . Anxiety   . GERD (gastroesophageal reflux disease)   . Esophageal varices with bleeding(456.0) 06/13/2014   Past Surgical History  Procedure Laterality Date  . Cholecystectomy    . Esophagogastroduodenoscopy N/A 06/12/2014    Procedure: ESOPHAGOGASTRODUODENOSCOPY (EGD);  Surgeon: Gatha Mayer, MD;  Location: [facility];  Service: Endoscopy;  Laterality: N/A;   Family History  Problem Relation Age of Onset  . Colon polyps Mother   . Hypertension Mother   . Thyroid disease Mother   . Alcoholism Mother   . Alcoholism Father   . Alcohol abuse Maternal Grandfather   . Alcohol abuse Paternal Grandfather   . Alcohol abuse Paternal Aunt   . Alcohol abuse Maternal Uncle    History  Substance Use Topics  . Smoking status: Never Smoker   . Smokeless tobacco: Never Used  . Alcohol Use: 42.0 oz/week    70 Glasses of wine per week     Comment: reports 3 bottle of wine and pint of or more of vodka daily. 2 bottles of wine currently  and pint of vodka some days for at least 4-5 days.a month.     OB History   Grav Para Term Preterm Abortions TAB SAB Ect Mult Living            2     Review of Systems  All systems reviewed and negative, other than as noted in HPI.   Allergies  Morphine and related; Morphine and related; and Nsaids  Home Medications   Prior to Admission medications   Medication Sig Start Date End Date Taking? Authorizing Provider  folic acid (FOLVITE) 1 MG tablet Take 1 mg by mouth daily.   Yes Historical Provider, MD  Multiple Vitamin (MULTIVITAMIN WITH MINERALS) TABS tablet Take 1 tablet by mouth daily. For low vitamin 02/05/14  Yes Encarnacion Slates, NP  nadolol (CORGARD) 40 MG tablet Take 1 tablet (40 mg total) by mouth daily. 06/16/14  Yes Nishant Dhungel, MD  pantoprazole (PROTONIX) 40 MG tablet Take 40 mg by mouth daily.   Yes Historical Provider, MD   BP 100/60  Pulse 115  Resp 18  SpO2 97%  LMP 07/18/2014 Physical Exam  Nursing note and vitals reviewed. Constitutional: She appears well-developed and well-nourished. No distress.  HENT:  Head: Normocephalic and atraumatic.  No external signs of acute trauma.  Eyes: EOM are normal. Pupils are equal, round, and reactive to light. Right eye exhibits no discharge. Left eye exhibits no discharge.  Bilateral conjunctival injection.  No icterus.  Neck: Neck supple.  Cardiovascular: Normal rate, regular rhythm and normal heart sounds.  Exam reveals no gallop and no friction rub.   No murmur heard. Pulmonary/Chest: Effort normal and breath sounds normal. No respiratory distress.  Abdominal: Soft. She exhibits no distension. There is no tenderness.  Musculoskeletal: She exhibits no edema and no tenderness.  Neurological:  Patient is somewhat drowsy and appears intoxicated. Her speech is understandable though. She is responding to questions appropriately. She follows commands. Cranial nerves intact. Strength is 5 bilateral upper lower extremities.   Skin: Skin is warm and dry.  Psychiatric: She has a normal mood and affect. Her behavior is normal. Thought content normal.    ED Course  Procedures (including critical care time) Labs Review Labs Reviewed  CBC WITH DIFFERENTIAL - Abnormal; Notable for the following:    RBC 3.31 (*)    Hemoglobin 10.3 (*)    HCT 31.1 (*)    Platelets 64 (*)    All other components within normal limits  COMPREHENSIVE METABOLIC PANEL - Abnormal; Notable for the following:    Glucose, Bld 105 (*)    BUN 4 (*)    Creatinine, Ser 0.37 (*)    Calcium 8.2 (*)    Albumin 3.3 (*)    AST 84 (*)    All other components within normal limits  PROTIME-INR - Abnormal; Notable for the following:    Prothrombin Time 16.9 (*)    All other components within normal limits  URINALYSIS, ROUTINE W REFLEX MICROSCOPIC - Abnormal; Notable for the following:    Hgb urine dipstick LARGE (*)    All other components within normal limits  URINE MICROSCOPIC-ADD ON    Imaging Review No results found.   EKG Interpretation None      MDM   Final diagnoses:  Fall, initial encounter  Alcohol dependence with intoxication with complication  Thrombocytopenia    38 year old female with alcohol intoxication/dependence. Some irregularities on workup. Mild bump in AST and thrombocytopenia likely secondary to alcohol abuse. History of varices although no evidence of active variceal bleeding currently. Blood pressure is fine. Ambulated without assistance prior to discharge. CT did not show any acute abnormality. Encouraged to get help for alcohol abuse. Patient reports that they have attempted several times. Patient is not currently interested.    Virgel Manifold, MD 07/23/14 [phone]

## 2014-07-25 ENCOUNTER — Telehealth: Payer: Self-pay

## 2014-07-25 NOTE — Progress Notes (Signed)
07-25-14 1130 Spoke with "Vaughan Basta" of Dr. Kel[facility] Fam office- unable to make contact with pt , no instructions or history done preprocedure. Pt. Not returning calls- left message x5, now voice mail full.

## 2014-07-25 NOTE — Telephone Encounter (Signed)
Received call from [place] from [facility], states they have been unable to get in touch with pt regarding her upcoming procedure. States they are not sure if the pt will show for the appt. Dr. Deatra Ina notified.

## 2014-07-28 NOTE — Anesthesia Preprocedure Evaluation (Addendum)
Anesthesia Evaluation  Patient identified by MRN, date of birth, ID band Patient awake    Reviewed: Allergy & Precautions, H&P , NPO status , Patient's Chart, lab work & pertinent test results  History of Anesthesia Complications Negative for: history of anesthetic complications  Airway Mallampati: II TM Distance: >3 FB Neck ROM: Full    Dental no notable dental hx.    Pulmonary pneumonia -, resolved,  breath sounds clear to auscultation  Pulmonary exam normal       Cardiovascular Exercise Tolerance: Good negative cardio ROS  Rhythm:Regular Rate:Normal     Neuro/Psych Anxiety Depression Hx of ETOH abuse, denies any additional substance abusenegative neurological ROS     GI/Hepatic GERD-  Medicated and Controlled,(+) Cirrhosis -  Esophageal Varices    , Denies any current GERD symptoms   Endo/Other  negative endocrine ROS  Renal/GU negative Renal ROS  negative genitourinary   Musculoskeletal negative musculoskeletal ROS (+)   Abdominal   Peds negative pediatric ROS (+)  Hematology  (+) anemia , thrombocytopenia   Anesthesia Other Findings Patient reports that she is anxious. Her last ETOH was on Sunday, but she denies feeling any withdrawal symptoms. She is hemodynamically stable, no tachycardia.   Reproductive/Obstetrics negative OB ROS                          Anesthesia Physical Anesthesia Plan  ASA: III  Anesthesia Plan: MAC   Post-op Pain Management:    Induction: Intravenous  Airway Management Planned:   Additional Equipment:   Intra-op Plan:   Post-operative Plan:   Informed Consent: I have reviewed the patients History and Physical, chart, labs and discussed the procedure including the risks, benefits and alternatives for the proposed anesthesia with the patient or authorized representative who has indicated his/her understanding and acceptance.   Dental advisory  given  Plan Discussed with: CRNA  Anesthesia Plan Comments:         Anesthesia Quick Evaluation

## 2014-07-29 ENCOUNTER — Encounter (HOSPITAL_COMMUNITY)
Admission: RE | Disposition: A | Discharge status: Home / Self Care | Payer: Self-pay | Source: Ambulatory Visit | Attending: Gastroenterology

## 2014-07-29 ENCOUNTER — Encounter (HOSPITAL_COMMUNITY): Payer: 59 | Admitting: Anesthesiology

## 2014-07-29 ENCOUNTER — Encounter (HOSPITAL_COMMUNITY): Payer: Self-pay | Admitting: Registered Nurse

## 2014-07-29 ENCOUNTER — Ambulatory Visit (HOSPITAL_COMMUNITY): Payer: 59 | Admitting: Anesthesiology

## 2014-07-29 ENCOUNTER — Ambulatory Visit (HOSPITAL_COMMUNITY)
Admission: RE | Admit: 2014-07-29 | Discharge: 2014-07-29 | Disposition: A | Discharge status: Home / Self Care | Payer: 59 | Source: Ambulatory Visit | Attending: Gastroenterology | Admitting: Gastroenterology

## 2014-07-29 DIAGNOSIS — F411 Generalized anxiety disorder: Secondary | ICD-10-CM | POA: Diagnosis not present

## 2014-07-29 DIAGNOSIS — F3289 Other specified depressive episodes: Secondary | ICD-10-CM | POA: Insufficient documentation

## 2014-07-29 DIAGNOSIS — K7460 Unspecified cirrhosis of liver: Secondary | ICD-10-CM | POA: Diagnosis not present

## 2014-07-29 DIAGNOSIS — Z0389 Encounter for observation for other suspected diseases and conditions ruled out: Secondary | ICD-10-CM | POA: Diagnosis present

## 2014-07-29 DIAGNOSIS — I8500 Esophageal varices without bleeding: Secondary | ICD-10-CM

## 2014-07-29 DIAGNOSIS — K219 Gastro-esophageal reflux disease without esophagitis: Secondary | ICD-10-CM | POA: Diagnosis not present

## 2014-07-29 DIAGNOSIS — F329 Major depressive disorder, single episode, unspecified: Secondary | ICD-10-CM | POA: Insufficient documentation

## 2014-07-29 HISTORY — PX: ESOPHAGOGASTRODUODENOSCOPY (EGD) WITH PROPOFOL: SHX5813

## 2014-07-29 SURGERY — ESOPHAGOGASTRODUODENOSCOPY (EGD) WITH PROPOFOL
Anesthesia: Monitor Anesthesia Care

## 2014-07-29 MED ORDER — SODIUM CHLORIDE 0.9 % IV SOLN: INTRAVENOUS | Status: DC

## 2014-07-29 MED ORDER — LACTATED RINGERS IV SOLN
INTRAVENOUS | Status: DC
  Administered 2014-07-29: 1000 mL via INTRAVENOUS

## 2014-07-29 MED ORDER — GLYCOPYRROLATE 0.2 MG/ML IJ SOLN
INTRAMUSCULAR | Status: AC
  Filled 2014-07-29: qty 1

## 2014-07-29 MED ORDER — PROPOFOL 10 MG/ML IV BOLUS
INTRAVENOUS | Status: AC
  Filled 2014-07-29: qty 20

## 2014-07-29 MED ORDER — PANTOPRAZOLE SODIUM 40 MG IV SOLR
40.0000 mg | Freq: Once | INTRAVENOUS | Status: AC
  Administered 2014-07-29: 40 mg via INTRAVENOUS
  Filled 2014-07-29: qty 40

## 2014-07-29 MED ORDER — PROPOFOL INFUSION 10 MG/ML OPTIME
INTRAVENOUS | Status: DC | PRN
  Administered 2014-07-29: 200 ug/kg/min via INTRAVENOUS

## 2014-07-29 MED ORDER — LIDOCAINE HCL (CARDIAC) 20 MG/ML IV SOLN
INTRAVENOUS | Status: AC
  Filled 2014-07-29: qty 5

## 2014-07-29 MED ORDER — ONDANSETRON HCL 4 MG/2ML IJ SOLN
INTRAMUSCULAR | Status: DC | PRN
  Administered 2014-07-29: 4 mg via INTRAVENOUS

## 2014-07-29 MED ORDER — ONDANSETRON HCL 4 MG/2ML IJ SOLN
INTRAMUSCULAR | Status: AC
  Filled 2014-07-29: qty 2

## 2014-07-29 MED ORDER — MIDAZOLAM HCL 2 MG/2ML IJ SOLN
INTRAMUSCULAR | Status: AC
  Filled 2014-07-29: qty 2

## 2014-07-29 MED ORDER — KETAMINE HCL 10 MG/ML IJ SOLN: INTRAMUSCULAR | Status: DC | PRN

## 2014-07-29 MED ORDER — GLYCOPYRROLATE 0.2 MG/ML IJ SOLN
INTRAMUSCULAR | Status: DC | PRN
  Administered 2014-07-29: 0.2 mg via INTRAVENOUS

## 2014-07-29 MED ORDER — BUTAMBEN-TETRACAINE-BENZOCAINE 2-2-14 % EX AERO
INHALATION_SPRAY | CUTANEOUS | Status: DC | PRN
  Administered 2014-07-29: 2 via TOPICAL

## 2014-07-29 MED ORDER — SODIUM CHLORIDE 0.9 % IJ SOLN
INTRAMUSCULAR | Status: AC
  Filled 2014-07-29: qty 10

## 2014-07-29 MED ORDER — PROMETHAZINE HCL 25 MG/ML IJ SOLN: 6.2500 mg | INTRAMUSCULAR | Status: DC | PRN

## 2014-07-29 MED ORDER — MIDAZOLAM HCL 5 MG/5ML IJ SOLN
INTRAMUSCULAR | Status: DC | PRN
  Administered 2014-07-29: 1 mg via INTRAVENOUS
  Administered 2014-07-29: 2 mg via INTRAVENOUS

## 2014-07-29 SURGICAL SUPPLY — 15 items

## 2014-07-29 NOTE — Anesthesia Postprocedure Evaluation (Signed)
  Anesthesia Post-op Note  Patient: Sara [name]  Procedure(s) Performed: Procedure(s) (LRB): ESOPHAGOGASTRODUODENOSCOPY (EGD) WITH PROPOFOL (N/A)  Patient Location: PACU  Anesthesia Type: MAC  Level of Consciousness: awake and alert   Airway and Oxygen Therapy: Patient Spontanous Breathing  Post-op Pain: mild  Post-op Assessment: Post-op Vital signs reviewed, Patient's Cardiovascular Status Stable, Respiratory Function Stable, Patent Airway and No signs of Nausea or vomiting  Last Vitals:  Filed Vitals:   07/29/14 1200  BP:   Pulse:   Temp:   Resp: 24    Post-op Vital Signs: stable   Complications: No apparent anesthesia complications

## 2014-07-29 NOTE — Op Note (Signed)
[facility] [place], [address]   ENDOSCOPY PROCEDURE REPORT  PATIENT: Sara [name], Sara [name]  MR#: [id number] BIRTHDATE: 04/06/1976 , 38  yrs. old GENDER: Female ENDOSCOPIST: Inda Castle, MD REFERRED BY: PROCEDURE DATE:  07/29/2014 PROCEDURE:  EGD, diagnostic ASA CLASS:     Class III INDICATIONS:  Screening for varices.  status post band ligation of bleeding esophageal varices July, 2015 MEDICATIONS: MAC sedation, administered by CRNA TOPICAL ANESTHETIC:  DESCRIPTION OF PROCEDURE: After the risks benefits and alternatives of the procedure were thoroughly explained, informed consent was obtained.  The Pentax Gastroscope [id number] endoscope was introduced through the mouth and advanced to the third portion of the duodenum. Without limitations.  The instrument was slowly withdrawn as the mucosa was fully examined.      The upper, middle and distal third of the esophagus were carefully inspected and no abnormalities were noted.  The z-line was well seen at the GEJ.  The endoscope was pushed into the fundus which was normal including a retroflexed view.  The antrum, gastric body, first and second part of the duodenum were unremarkable. Retroflexed views revealed no abnormalities.     The scope was then withdrawn from the patient and the procedure completed.  COMPLICATIONS: There were no complications. ENDOSCOPIC IMPRESSION: Normal EGD  RECOMMENDATIONS: 1.  continue nadolol  REPEAT EXAM: 6 months  eSigned:  Inda Castle, MD 07/29/2014 11:17 AM   CC:

## 2014-07-29 NOTE — Transfer of Care (Signed)
Immediate Anesthesia Transfer of Care Note  Patient: Sara [name]  Procedure(s) Performed: Procedure(s): ESOPHAGOGASTRODUODENOSCOPY (EGD) WITH PROPOFOL (N/A)  Patient Location: PACU  Anesthesia Type:MAC  Level of Consciousness: awake, alert , oriented and patient cooperative  Airway & Oxygen Therapy: Patient Spontanous Breathing  Post-op Assessment: Report given to PACU RN, Post -op Vital signs reviewed and stable and Patient moving all extremities X 4  Post vital signs: stable  Complications: No apparent anesthesia complications

## 2014-07-29 NOTE — Addendum Note (Signed)
Addendum created 07/29/14 1515 by Lissa Morales, CRNA   Modules edited: Anesthesia Events

## 2014-07-29 NOTE — Interval H&P Note (Signed)
History and Physical Interval Note:  07/29/2014 10:13 AM  Sara [name]  has presented today for surgery, with the diagnosis of Esphageal varices  The various methods of treatment have been discussed with the patient and family. After consideration of risks, benefits and other options for treatment, the patient has consented to  Procedure(s): ESOPHAGOGASTRODUODENOSCOPY (EGD) WITH PROPOFOL (N/A) ESOPHAGEAL BANDING (N/A) as a surgical intervention .  The patient's history has been reviewed, patient examined, no change in status, stable for surgery.  I have reviewed the patient's chart and labs.  Questions were answered to the patient's satisfaction.     The recent H&P (dated **07/11/14*) was reviewed, the patient was examined and there is no change in the patients condition since that H&P was completed.   Erskine Emery  07/29/2014, 10:20 AM    Erskine Emery

## 2014-07-29 NOTE — H&P (View-Only) (Signed)
Reviewed and agree with management. Robert D. Kaplan, M.D., FACG  

## 2014-07-29 NOTE — Discharge Instructions (Signed)
Esophagogastroduodenoscopy °Care After °Refer to this sheet in the next few weeks. These instructions provide you with information on caring for yourself after your procedure. Your caregiver may also give you more specific instructions. Your treatment has been planned according to current medical practices, but problems sometimes occur. Call your caregiver if you have any problems or questions after your procedure.  °HOME CARE INSTRUCTIONS °· Do not eat or drink anything until the numbing medicine (local anesthetic) has worn off and your gag reflex has returned. You will know that the local anesthetic has worn off when you can swallow comfortably. °· Do not drive for 12 hours after the procedure or as directed by your caregiver. °· Only take medicines as directed by your caregiver. °SEEK MEDICAL CARE IF:  °· You cannot stop coughing. °· You are not urinating at all or less than usual. °SEEK IMMEDIATE MEDICAL CARE IF: °· You have difficulty swallowing. °· You cannot eat or drink. °· You have worsening throat or chest pain. °· You have dizziness, lightheadedness, or you faint. °· You have nausea or vomiting. °· You have chills. °· You have a fever. °· You have severe abdominal pain. °· You have black, tarry, or bloody stools. °Document Released: 10/24/2012 Document Reviewed: 10/24/2012 °ExitCare® Patient Information ©2015 [employer]. This information is not intended to replace advice given to you by your health care provider. Make sure you discuss any questions you have with your health care provider. ° ° °Conscious Sedation, Adult, Care After °Refer to this sheet in the next few weeks. These instructions provide you with information on caring for yourself after your procedure. Your health care provider may also give you more specific instructions. Your treatment has been planned according to current medical practices, but problems sometimes occur. Call your health care provider if you have any problems or  questions after your procedure. °WHAT TO EXPECT AFTER THE PROCEDURE  °After your procedure: °· You may feel sleepy, clumsy, and have poor balance for several hours. °· Vomiting may occur if you eat too soon after the procedure. °HOME CARE INSTRUCTIONS °· Do not participate in any activities where you could become injured for at least 24 hours. Do not: °¨ Drive. °¨ Swim. °¨ Ride a bicycle. °¨ Operate heavy machinery. °¨ Cook. °¨ Use power tools. °¨ Climb ladders. °¨ Work from a high place. °· Do not make important decisions or sign legal documents until you are improved. °· If you vomit, drink water, juice, or soup when you can drink without vomiting. Make sure you have little or no nausea before eating solid foods. °· Only take over-the-counter or prescription medicines for pain, discomfort, or fever as directed by your health care provider. °· Make sure you and your family fully understand everything about the medicines given to you, including what side effects may occur. °· You should not drink alcohol, take sleeping pills, or take medicines that cause drowsiness for at least 24 hours. °· If you smoke, do not smoke without supervision. °· If you are feeling better, you may resume normal activities 24 hours after you were sedated. °· Keep all appointments with your health care provider. °SEEK MEDICAL CARE IF: °· Your skin is pale or bluish in color. °· You continue to feel nauseous or vomit. °· Your pain is getting worse and is not helped by medicine. °· You have bleeding or swelling. °· You are still sleepy or feeling clumsy after 24 hours. °SEEK IMMEDIATE MEDICAL CARE IF: °· You develop a   rash. °· You have difficulty breathing. °· You develop any type of allergic problem. °· You have a fever. °MAKE SURE YOU: °· Understand these instructions. °· Will watch your condition. °· Will get help right away if you are not doing well or get worse. °Document Released: 08/28/2013 Document Reviewed: 08/28/2013 °ExitCare®  Patient Information ©2015 [employer]. This information is not intended to replace advice given to you by your health care provider. Make sure you discuss any questions you have with your health care provider. ° °

## 2014-07-30 ENCOUNTER — Encounter (HOSPITAL_COMMUNITY): Payer: Self-pay | Admitting: Gastroenterology

## 2014-08-14 ENCOUNTER — Telehealth: Payer: Self-pay | Admitting: *Deleted

## 2014-08-14 NOTE — Telephone Encounter (Signed)
This patient did not answer message left on phone and letter was mailed to patient requesting she call to schedule injections. She has not responded to this either.

## 2014-08-14 NOTE — Telephone Encounter (Signed)
Message copied by Hulan Saas on Thu Aug 14, 2014  9:15 AM ------      Message from: Hulan Saas      Created: Thu Jul 17, 2014  9:57 AM       Did patient call to set up hept A and B injections. ------

## 2014-08-18 ENCOUNTER — Encounter (HOSPITAL_BASED_OUTPATIENT_CLINIC_OR_DEPARTMENT_OTHER): Payer: Self-pay | Admitting: Emergency Medicine

## 2014-08-18 ENCOUNTER — Emergency Department (HOSPITAL_BASED_OUTPATIENT_CLINIC_OR_DEPARTMENT_OTHER)
Admission: EM | Admit: 2014-08-18 | Discharge: 2014-08-18 | Disposition: A | Discharge status: Home / Self Care | Payer: 59 | Attending: Emergency Medicine | Admitting: Emergency Medicine

## 2014-08-18 DIAGNOSIS — G8911 Acute pain due to trauma: Secondary | ICD-10-CM | POA: Insufficient documentation

## 2014-08-18 DIAGNOSIS — I8501 Esophageal varices with bleeding: Secondary | ICD-10-CM | POA: Insufficient documentation

## 2014-08-18 DIAGNOSIS — Z79899 Other long term (current) drug therapy: Secondary | ICD-10-CM | POA: Insufficient documentation

## 2014-08-18 DIAGNOSIS — K219 Gastro-esophageal reflux disease without esophagitis: Secondary | ICD-10-CM | POA: Diagnosis not present

## 2014-08-18 DIAGNOSIS — M545 Low back pain, unspecified: Secondary | ICD-10-CM | POA: Diagnosis not present

## 2014-08-18 DIAGNOSIS — F411 Generalized anxiety disorder: Secondary | ICD-10-CM | POA: Insufficient documentation

## 2014-08-18 DIAGNOSIS — Z8701 Personal history of pneumonia (recurrent): Secondary | ICD-10-CM | POA: Insufficient documentation

## 2014-08-18 MED ORDER — METHOCARBAMOL 500 MG PO TABS: 500.0000 mg | ORAL_TABLET | Freq: Two times a day (BID) | ORAL | Status: DC

## 2014-08-18 NOTE — ED Notes (Signed)
She tripped and fell down stairs a couple of weeks ago. C.o pain in her lower back.

## 2014-08-18 NOTE — ED Provider Notes (Signed)
CSN: [id number]     Arrival date & time 08/18/14  2113 History   First MD Initiated Contact with Patient 08/18/14 2143     Chief Complaint  Patient presents with  . Back Pain     (Consider location/radiation/quality/duration/timing/severity/associated sxs/prior Treatment) Patient is a 38 y.o. female presenting with back pain. The history is provided by the patient. No language interpreter was used.  Back Pain Location:  Lumbar spine Quality:  Aching Associated symptoms: no fever, no headaches and no numbness   Associated symptoms comment:  She complains of lower back pain since a fall 10 days ago. She was walking down a flight of stairs and fell backward to a sitting position. She was seen afterward and reports negative x-rays. She was given Skelaxin at the time of the initial evaluation but has been unable to fill the prescription due to insurance reasons. No new injury. No abdominal pain, urinary/bowel incontinence, or pain that radiates into lower extremities.    Past Medical History  Diagnosis Date  . Depression   . Pneumonia   . Cirrhosis   . Portal hypertension   . S/P alcohol detoxification     2-3 days at behavioral health previously  . Alcoholism   . Anxiety   . GERD (gastroesophageal reflux disease)   . Esophageal varices with bleeding(456.0) 06/13/2014   Past Surgical History  Procedure Laterality Date  . Cholecystectomy    . Esophagogastroduodenoscopy N/A 06/12/2014    Procedure: ESOPHAGOGASTRODUODENOSCOPY (EGD);  Surgeon: Gatha Mayer, MD;  Location: [facility];  Service: Endoscopy;  Laterality: N/A;  . Esophagogastroduodenoscopy (egd) with propofol N/A 07/29/2014    Procedure: ESOPHAGOGASTRODUODENOSCOPY (EGD) WITH PROPOFOL;  Surgeon: Inda Castle, MD;  Location: [facility];  Service: Endoscopy;  Laterality: N/A;   Family History  Problem Relation Age of Onset  . Colon polyps Mother   . Hypertension Mother   . Thyroid disease Mother   . Alcoholism Mother    . Alcoholism Father   . Alcohol abuse Maternal Grandfather   . Alcohol abuse Paternal Grandfather   . Alcohol abuse Paternal Aunt   . Alcohol abuse Maternal Uncle    History  Substance Use Topics  . Smoking status: Never Smoker   . Smokeless tobacco: Never Used  . Alcohol Use: 42.0 oz/week    70 Glasses of wine per week     Comment: reports 3 bottle of wine and pint of or more of vodka daily. 2 bottles of wine currently and pint of vodka some days for at least 4-5 days.a month.     OB History   Grav Para Term Preterm Abortions TAB SAB Ect Mult Living            2     Review of Systems  Constitutional: Negative for fever and chills.  Respiratory: Negative.   Cardiovascular: Negative.   Gastrointestinal: Negative.  Negative for nausea.  Genitourinary: Negative.  Negative for difficulty urinating.  Musculoskeletal: Positive for back pain.  Skin: Negative.   Neurological: Negative.  Negative for numbness and headaches.      Allergies  Morphine and related; Morphine and related; and Nsaids  Home Medications   Prior to Admission medications   Medication Sig Start Date End Date Taking? Authorizing Provider  folic acid (FOLVITE) 1 MG tablet Take 1 mg by mouth daily.    Historical Provider, MD  Multiple Vitamin (MULTIVITAMIN WITH MINERALS) TABS tablet Take 1 tablet by mouth daily. For low vitamin 02/05/14   Loleta Dicker  [place], NP  nadolol (CORGARD) 40 MG tablet Take 1 tablet (40 mg total) by mouth daily. 06/16/14   Nishant Dhungel, MD  pantoprazole (PROTONIX) 40 MG tablet Take 40 mg by mouth daily.    Historical Provider, MD   BP 117/61  Pulse 88  Temp(Src) 99 F (37.2 C) (Oral)  Resp 16  Ht 5' 3.5" (1.613 m)  Wt 170 lb (77.111 kg)  BMI 29.64 kg/m2  SpO2 98%  LMP 08/04/2014 Physical Exam  Constitutional: She is oriented to person, place, and time. She appears well-developed and well-nourished.  HENT:  Head: Normocephalic.  Neck: Normal range of motion. Neck supple.   Cardiovascular: Normal rate, regular rhythm and intact distal pulses.   Pulmonary/Chest: Effort normal and breath sounds normal.  Abdominal: Soft. Bowel sounds are normal. There is no tenderness. There is no rebound and no guarding.  Musculoskeletal: Normal range of motion.  Mild lumbar and paralumbar tenderness without swelling or discoloration.  Neurological: She is alert and oriented to person, place, and time. She has normal reflexes. She exhibits normal muscle tone. Coordination normal.  Skin: Skin is warm and dry. No rash noted.  Psychiatric: She has a normal mood and affect.    ED Course  Procedures (including critical care time) Labs Review Labs Reviewed - No data to display  Imaging Review No results found.   EKG Interpretation None      MDM   Final diagnoses:  None    1. Low back pain  She reports negative imaging at the time of the injury. Do not feel that repeat imaging would provide any benefit. Will provide rx for Robaxin as alternative muscle relaxer to Skelaxin. Encouraged PCP follow up this week for recheck.     Dewaine Oats, PA-C 08/18/14 2253

## 2014-08-18 NOTE — Discharge Instructions (Signed)
Back Injury Prevention Back injuries can be extremely painful and difficult to heal. After having one back injury, you are much more likely to experience another later on. It is important to learn how to avoid injuring or re-injuring your back. The following tips can help you to prevent a back injury. PHYSICAL FITNESS  Exercise regularly and try to develop good tone in your abdominal muscles. Your abdominal muscles provide a lot of the support needed by your back.  Do aerobic exercises (walking, jogging, biking, swimming) regularly.  Do exercises that increase balance and strength (tai chi, yoga) regularly. This can decrease your risk of falling and injuring your back.  Stretch before and after exercising.  Maintain a healthy weight. The more you weigh, the more stress is placed on your back. For every pound of weight, 10 times that amount of pressure is placed on the back. DIET  Talk to your caregiver about how much calcium and vitamin D you need per day. These nutrients help to prevent weakening of the bones (osteoporosis). Osteoporosis can cause broken (fractured) bones that lead to back pain.  Include good sources of calcium in your diet, such as dairy products, green, leafy vegetables, and products with calcium added (fortified).  Include good sources of vitamin D in your diet, such as milk and foods that are fortified with vitamin D.  Consider taking a nutritional supplement or a multivitamin if needed.  Stop smoking if you smoke. POSTURE  Sit and stand up straight. Avoid leaning forward when you sit or hunching over when you stand.  Choose chairs with good low back (lumbar) support.  If you work at a desk, sit close to your work so you do not need to lean over. Keep your chin tucked in. Keep your neck drawn back and elbows bent at a right angle. Your arms should look like the letter "L."  Sit high and close to the steering wheel when you drive. Add a lumbar support to your car  seat if needed.  Avoid sitting or standing in one position for too long. Take breaks to get up, stretch, and walk around at least once every hour. Take breaks if you are driving for long periods of time.  Sleep on your side with your knees slightly bent, or sleep on your back with a pillow under your knees. Do not sleep on your stomach. LIFTING, TWISTING, AND REACHING  Avoid heavy lifting, especially repetitive lifting. If you must do heavy lifting:  Stretch before lifting.  Work slowly.  Rest between lifts.  Use carts and dollies to move objects when possible.  Make several small trips instead of carrying 1 heavy load.  Ask for help when you need it.  Ask for help when moving big, awkward objects.  Follow these steps when lifting:  Stand with your feet shoulder-width apart.  Get as close to the object as you can. Do not try to pick up heavy objects that are far from your body.  Use handles or lifting straps if they are available.  Bend at your knees. Squat down, but keep your heels off the floor.  Keep your shoulders pulled back, your chin tucked in, and your back straight.  Lift the object slowly, tightening the muscles in your legs, abdomen, and buttocks. Keep the object as close to the center of your body as possible.  When you put a load down, use these same guidelines in reverse.  Do not:  Lift the object above your waist.  Twist at the waist while lifting or carrying a load. Move your feet if you need to turn, not your waist.  Bend over without bending at your knees.  Avoid reaching over your head, across a table, or for an object on a high surface. OTHER TIPS  Avoid wet floors and keep sidewalks clear of ice to prevent falls.  Do not sleep on a mattress that is too soft or too hard.  Keep items that are used frequently within easy reach.  Put heavier objects on shelves at waist level and lighter objects on lower or higher shelves.  Find ways to  decrease your stress, such as exercise, massage, or relaxation techniques. Stress can build up in your muscles. Tense muscles are more vulnerable to injury.  Seek treatment for depression or anxiety if needed. These conditions can increase your risk of developing back pain. SEEK MEDICAL CARE IF:  You injure your back.  You have questions about diet, exercise, or other ways to prevent back injuries. MAKE SURE YOU:  Understand these instructions.  Will watch your condition.  Will get help right away if you are not doing well or get worse. Document Released: 12/15/2004 Document Revised: 01/30/2012 Document Reviewed: 12/19/2011 Exit[employer] Patient Information 2015 [employer]. This information is not intended to replace advice given to you by your health care provider. Make sure you discuss any questions you have with your health care provider.  Back Pain, Adult Low back pain is very common. About 1 in 5 people have back pain.The cause of low back pain is rarely dangerous. The pain often gets better over time.About half of people with a sudden onset of back pain feel better in just 2 weeks. About 8 in 10 people feel better by 6 weeks.  CAUSES Some common causes of back pain include:  Strain of the muscles or ligaments supporting the spine.  Wear and tear (degeneration) of the spinal discs.  Arthritis.  Direct injury to the back. DIAGNOSIS Most of the time, the direct cause of low back pain is not known.However, back pain can be treated effectively even when the exact cause of the pain is unknown.Answering your caregiver's questions about your overall health and symptoms is one of the most accurate ways to make sure the cause of your pain is not dangerous. If your caregiver needs more information, he or she may order lab work or imaging tests (X-rays or MRIs).However, even if imaging tests show changes in your back, this usually does not require surgery. HOME CARE INSTRUCTIONS For  many people, back pain returns.Since low back pain is rarely dangerous, it is often a condition that people can learn to manageon their own.   Remain active. It is stressful on the back to sit or stand in one place. Do not sit, drive, or stand in one place for more than 30 minutes at a time. Take short walks on level surfaces as soon as pain allows.Try to increase the length of time you walk each day.  Do not stay in bed.Resting more than 1 or 2 days can delay your recovery.  Do not avoid exercise or work.Your body is made to move.It is not dangerous to be active, even though your back may hurt.Your back will likely heal faster if you return to being active before your pain is gone.  Pay attention to your body when you bend and lift. Many people have less discomfortwhen lifting if they bend their knees, keep the load close to their bodies,and   avoid twisting. Often, the most comfortable positions are those that put less stress on your recovering back.  Find a comfortable position to sleep. Use a firm mattress and lie on your side with your knees slightly bent. If you lie on your back, put a pillow under your knees.  Only take over-the-counter or prescription medicines as directed by your caregiver. Over-the-counter medicines to reduce pain and inflammation are often the most helpful.Your caregiver may prescribe muscle relaxant drugs.These medicines help dull your pain so you can more quickly return to your normal activities and healthy exercise.  Put ice on the injured area.  Put ice in a plastic bag.  Place a towel between your skin and the bag.  Leave the ice on for 15-20 minutes, 03-04 times a day for the first 2 to 3 days. After that, ice and heat may be alternated to reduce pain and spasms.  Ask your caregiver about trying back exercises and gentle massage. This may be of some benefit.  Avoid feeling anxious or stressed.Stress increases muscle tension and can worsen back  pain.It is important to recognize when you are anxious or stressed and learn ways to manage it.Exercise is a great option. SEEK MEDICAL CARE IF:  You have pain that is not relieved with rest or medicine.  You have pain that does not improve in 1 week.  You have new symptoms.  You are generally not feeling well. SEEK IMMEDIATE MEDICAL CARE IF:   You have pain that radiates from your back into your legs.  You develop new bowel or bladder control problems.  You have unusual weakness or numbness in your arms or legs.  You develop nausea or vomiting.  You develop abdominal pain.  You feel faint. Document Released: 11/07/2005 Document Revised: 05/08/2012 Document Reviewed: 03/11/2014 [employer] Patient Information 2015 [employer]. This information is not intended to replace advice given to you by your health care provider. Make sure you discuss any questions you have with your health care provider.   

## 2014-08-19 NOTE — ED Provider Notes (Signed)
Medical screening examination/treatment/procedure(s) were performed by non-physician practitioner and as supervising physician I was immediately available for consultation/collaboration.   EKG Interpretation None        Pamella Pert, MD 08/19/14 2306

## 2014-09-12 ENCOUNTER — Inpatient Hospital Stay (HOSPITAL_COMMUNITY)
Admission: EM | Admit: 2014-09-12 | Discharge: 2014-09-14 | DRG: 811 | Disposition: A | Discharge status: Home / Self Care | Payer: 59 | Attending: Internal Medicine | Admitting: Internal Medicine

## 2014-09-12 ENCOUNTER — Encounter (HOSPITAL_COMMUNITY): Payer: Self-pay | Admitting: Emergency Medicine

## 2014-09-12 DIAGNOSIS — E876 Hypokalemia: Secondary | ICD-10-CM | POA: Diagnosis present

## 2014-09-12 DIAGNOSIS — K219 Gastro-esophageal reflux disease without esophagitis: Secondary | ICD-10-CM | POA: Diagnosis present

## 2014-09-12 DIAGNOSIS — F10229 Alcohol dependence with intoxication, unspecified: Secondary | ICD-10-CM | POA: Diagnosis present

## 2014-09-12 DIAGNOSIS — D509 Iron deficiency anemia, unspecified: Principal | ICD-10-CM | POA: Diagnosis present

## 2014-09-12 DIAGNOSIS — R1010 Upper abdominal pain, unspecified: Secondary | ICD-10-CM

## 2014-09-12 DIAGNOSIS — K7030 Alcoholic cirrhosis of liver without ascites: Secondary | ICD-10-CM | POA: Diagnosis present

## 2014-09-12 DIAGNOSIS — K766 Portal hypertension: Secondary | ICD-10-CM | POA: Diagnosis present

## 2014-09-12 DIAGNOSIS — F329 Major depressive disorder, single episode, unspecified: Secondary | ICD-10-CM | POA: Diagnosis present

## 2014-09-12 DIAGNOSIS — I8500 Esophageal varices without bleeding: Secondary | ICD-10-CM | POA: Diagnosis present

## 2014-09-12 DIAGNOSIS — D72819 Decreased white blood cell count, unspecified: Secondary | ICD-10-CM | POA: Diagnosis present

## 2014-09-12 DIAGNOSIS — F419 Anxiety disorder, unspecified: Secondary | ICD-10-CM | POA: Diagnosis present

## 2014-09-12 DIAGNOSIS — F10920 Alcohol use, unspecified with intoxication, uncomplicated: Secondary | ICD-10-CM

## 2014-09-12 DIAGNOSIS — K7010 Alcoholic hepatitis without ascites: Secondary | ICD-10-CM | POA: Diagnosis present

## 2014-09-12 DIAGNOSIS — R109 Unspecified abdominal pain: Secondary | ICD-10-CM | POA: Diagnosis present

## 2014-09-12 DIAGNOSIS — K859 Acute pancreatitis, unspecified: Secondary | ICD-10-CM | POA: Diagnosis present

## 2014-09-12 DIAGNOSIS — Z79899 Other long term (current) drug therapy: Secondary | ICD-10-CM

## 2014-09-12 DIAGNOSIS — F10230 Alcohol dependence with withdrawal, uncomplicated: Secondary | ICD-10-CM

## 2014-09-12 DIAGNOSIS — K852 Alcohol induced acute pancreatitis without necrosis or infection: Secondary | ICD-10-CM

## 2014-09-12 DIAGNOSIS — F1994 Other psychoactive substance use, unspecified with psychoactive substance-induced mood disorder: Secondary | ICD-10-CM

## 2014-09-12 DIAGNOSIS — F1020 Alcohol dependence, uncomplicated: Secondary | ICD-10-CM | POA: Diagnosis present

## 2014-09-12 DIAGNOSIS — D696 Thrombocytopenia, unspecified: Secondary | ICD-10-CM | POA: Diagnosis present

## 2014-09-12 DIAGNOSIS — F1029 Alcohol dependence with unspecified alcohol-induced disorder: Secondary | ICD-10-CM

## 2014-09-12 DIAGNOSIS — D649 Anemia, unspecified: Secondary | ICD-10-CM

## 2014-09-12 DIAGNOSIS — D500 Iron deficiency anemia secondary to blood loss (chronic): Secondary | ICD-10-CM

## 2014-09-12 DIAGNOSIS — F1010 Alcohol abuse, uncomplicated: Secondary | ICD-10-CM

## 2014-09-12 HISTORY — DX: Gastrointestinal hemorrhage, unspecified: K92.2

## 2014-09-12 HISTORY — DX: Excessive and frequent menstruation with regular cycle: N92.0

## 2014-09-12 HISTORY — DX: Other pancytopenia: D61.818

## 2014-09-12 LAB — RETICULOCYTES
RBC.: 2.92 MIL/uL — AB (ref 3.87–5.11)
Retic Count, Absolute: 70.1 10*3/uL (ref 19.0–186.0)
Retic Ct Pct: 2.4 % (ref 0.4–3.1)

## 2014-09-12 LAB — URINALYSIS, ROUTINE W REFLEX MICROSCOPIC
Bilirubin Urine: NEGATIVE
GLUCOSE, UA: NEGATIVE mg/dL
Hgb urine dipstick: NEGATIVE
Ketones, ur: NEGATIVE mg/dL
LEUKOCYTES UA: NEGATIVE
NITRITE: NEGATIVE
PROTEIN: NEGATIVE mg/dL
Specific Gravity, Urine: 1.008 (ref 1.005–1.030)
UROBILINOGEN UA: 1 mg/dL (ref 0.0–1.0)
pH: 5.5 (ref 5.0–8.0)

## 2014-09-12 LAB — BASIC METABOLIC PANEL
Anion gap: 14 (ref 5–15)
BUN: 8 mg/dL (ref 6–23)
CALCIUM: 8.7 mg/dL (ref 8.4–10.5)
CO2: 22 mEq/L (ref 19–32)
Chloride: 103 mEq/L (ref 96–112)
Creatinine, Ser: 0.45 mg/dL — ABNORMAL LOW (ref 0.50–1.10)
GFR calc Af Amer: >90 mL/min (ref >90)
GFR calc non Af Amer: >90 mL/min (ref >90)
Glucose, Bld: 101 mg/dL — ABNORMAL HIGH (ref 70–99)
POTASSIUM: 4 meq/L (ref 3.7–5.3)
SODIUM: 139 meq/L (ref 137–147)

## 2014-09-12 LAB — CBC
HEMATOCRIT: 25.5 % — AB (ref 36.0–46.0)
Hemoglobin: 7.3 g/dL — ABNORMAL LOW (ref 12.0–15.0)
MCH: 24.6 pg — ABNORMAL LOW (ref 26.0–34.0)
MCHC: 28.6 g/dL — AB (ref 30.0–36.0)
MCV: 85.9 fL (ref 78.0–100.0)
Platelets: 56 10*3/uL — ABNORMAL LOW (ref 150–400)
RBC: 2.97 MIL/uL — ABNORMAL LOW (ref 3.87–5.11)
RDW: 18.1 % — AB (ref 11.5–15.5)
WBC: 4.2 10*3/uL (ref 4.0–10.5)

## 2014-09-12 LAB — HEMOGLOBIN AND HEMATOCRIT, BLOOD
HCT: 24.2 % — ABNORMAL LOW (ref 36.0–46.0)
Hemoglobin: 7 g/dL — ABNORMAL LOW (ref 12.0–15.0)

## 2014-09-12 LAB — I-STAT TROPONIN, ED: Troponin i, poc: 0 ng/mL (ref 0.00–0.08)

## 2014-09-12 LAB — ETHANOL: ALCOHOL ETHYL (B): 306 mg/dL — AB (ref 0–11)

## 2014-09-12 LAB — RAPID URINE DRUG SCREEN, HOSP PERFORMED
AMPHETAMINES: NOT DETECTED
BARBITURATES: NOT DETECTED
Benzodiazepines: NOT DETECTED
Cocaine: NOT DETECTED
Opiates: NOT DETECTED
Tetrahydrocannabinol: NOT DETECTED

## 2014-09-12 LAB — HEPATIC FUNCTION PANEL
ALT: 38 U/L — AB (ref 0–35)
AST: 151 U/L — AB (ref 0–37)
Albumin: 3.3 g/dL — ABNORMAL LOW (ref 3.5–5.2)
Alkaline Phosphatase: 105 U/L (ref 39–117)
BILIRUBIN INDIRECT: 0.5 mg/dL (ref 0.3–0.9)
BILIRUBIN TOTAL: 1.1 mg/dL (ref 0.3–1.2)
Bilirubin, Direct: 0.6 mg/dL — ABNORMAL HIGH (ref 0.0–0.3)
Total Protein: 7.9 g/dL (ref 6.0–8.3)

## 2014-09-12 LAB — PROTIME-INR
INR: 1.19 (ref 0.00–1.49)
Prothrombin Time: 15.3 seconds — ABNORMAL HIGH (ref 11.6–15.2)

## 2014-09-12 LAB — APTT: aPTT: 39 seconds — ABNORMAL HIGH (ref 24–37)

## 2014-09-12 LAB — IRON AND TIBC
Iron: 12 ug/dL — ABNORMAL LOW (ref 42–135)
Saturation Ratios: 2 % — ABNORMAL LOW (ref 20–55)
TIBC: 554 ug/dL — ABNORMAL HIGH (ref 250–470)
UIBC: 542 ug/dL — ABNORMAL HIGH (ref 125–400)

## 2014-09-12 LAB — LIPASE, BLOOD: Lipase: 138 U/L — ABNORMAL HIGH (ref 11–59)

## 2014-09-12 LAB — POC OCCULT BLOOD, ED: Fecal Occult Bld: NEGATIVE

## 2014-09-12 LAB — PREGNANCY, URINE: Preg Test, Ur: NEGATIVE

## 2014-09-12 MED ORDER — LORAZEPAM 2 MG/ML IJ SOLN: 0.0000 mg | Freq: Two times a day (BID) | INTRAMUSCULAR | Status: DC

## 2014-09-12 MED ORDER — SODIUM CHLORIDE 0.9 % IV SOLN
INTRAVENOUS | Status: AC
  Administered 2014-09-12: 20:00:00 via INTRAVENOUS

## 2014-09-12 MED ORDER — SODIUM CHLORIDE 0.9 % IV BOLUS (SEPSIS)
1000.0000 mL | Freq: Once | INTRAVENOUS | Status: AC
  Administered 2014-09-12: 1000 mL via INTRAVENOUS

## 2014-09-12 MED ORDER — LORAZEPAM 1 MG PO TABS
0.0000 mg | ORAL_TABLET | Freq: Four times a day (QID) | ORAL | Status: DC
  Administered 2014-09-12: 2 mg via ORAL
  Administered 2014-09-12 – 2014-09-13 (×2): 1 mg via ORAL
  Administered 2014-09-13: 2 mg via ORAL
  Administered 2014-09-13: 1 mg via ORAL
  Administered 2014-09-14: 2 mg via ORAL
  Administered 2014-09-14: 1 mg via ORAL
  Filled 2014-09-12: qty 1
  Filled 2014-09-12: qty 2
  Filled 2014-09-12 (×2): qty 1
  Filled 2014-09-12 (×3): qty 2

## 2014-09-12 MED ORDER — VITAMIN B-1 100 MG PO TABS
100.0000 mg | ORAL_TABLET | Freq: Every day | ORAL | Status: DC
  Administered 2014-09-13 – 2014-09-14 (×2): 100 mg via ORAL
  Filled 2014-09-12 (×2): qty 1

## 2014-09-12 MED ORDER — THIAMINE HCL 100 MG/ML IJ SOLN
100.0000 mg | Freq: Every day | INTRAMUSCULAR | Status: DC
  Filled 2014-09-12 (×2): qty 1

## 2014-09-12 MED ORDER — POLYETHYLENE GLYCOL 3350 17 G PO PACK
17.0000 g | PACK | Freq: Every day | ORAL | Status: DC | PRN
  Filled 2014-09-12: qty 1

## 2014-09-12 MED ORDER — ONDANSETRON HCL 4 MG/2ML IJ SOLN
4.0000 mg | Freq: Four times a day (QID) | INTRAMUSCULAR | Status: DC | PRN
  Administered 2014-09-13 (×2): 4 mg via INTRAVENOUS
  Filled 2014-09-12 (×2): qty 2

## 2014-09-12 MED ORDER — PANTOPRAZOLE SODIUM 40 MG PO TBEC
40.0000 mg | DELAYED_RELEASE_TABLET | Freq: Every day | ORAL | Status: DC
  Administered 2014-09-13 – 2014-09-14 (×2): 40 mg via ORAL
  Filled 2014-09-12 (×2): qty 1

## 2014-09-12 MED ORDER — LORAZEPAM 1 MG PO TABS: 0.0000 mg | ORAL_TABLET | Freq: Two times a day (BID) | ORAL | Status: DC

## 2014-09-12 MED ORDER — LORAZEPAM 2 MG/ML IJ SOLN: 0.0000 mg | Freq: Four times a day (QID) | INTRAMUSCULAR | Status: DC

## 2014-09-12 MED ORDER — ONDANSETRON HCL 4 MG/2ML IJ SOLN: 4.0000 mg | Freq: Three times a day (TID) | INTRAMUSCULAR | Status: DC | PRN

## 2014-09-12 MED ORDER — ADULT MULTIVITAMIN W/MINERALS CH
1.0000 | ORAL_TABLET | Freq: Every day | ORAL | Status: DC
  Administered 2014-09-13 – 2014-09-14 (×2): 1 via ORAL
  Filled 2014-09-12 (×2): qty 1

## 2014-09-12 MED ORDER — LORAZEPAM 1 MG PO TABS
0.0000 mg | ORAL_TABLET | Freq: Two times a day (BID) | ORAL | Status: DC
Start: 2014-09-12 — End: 2014-09-12

## 2014-09-12 MED ORDER — GI COCKTAIL ~~LOC~~
30.0000 mL | Freq: Once | ORAL | Status: AC
  Administered 2014-09-12: 30 mL via ORAL
  Filled 2014-09-12: qty 30

## 2014-09-12 MED ORDER — ONDANSETRON HCL 4 MG PO TABS
4.0000 mg | ORAL_TABLET | Freq: Four times a day (QID) | ORAL | Status: DC | PRN
Start: 2014-09-12 — End: 2014-09-14
  Filled 2014-09-12: qty 1

## 2014-09-12 MED ORDER — NADOLOL 40 MG PO TABS
40.0000 mg | ORAL_TABLET | Freq: Every day | ORAL | Status: DC
  Administered 2014-09-13 – 2014-09-14 (×2): 40 mg via ORAL
  Filled 2014-09-12 (×2): qty 1

## 2014-09-12 MED ORDER — THIAMINE HCL 100 MG/ML IJ SOLN
100.0000 mg | Freq: Every day | INTRAMUSCULAR | Status: DC
  Filled 2014-09-12: qty 1

## 2014-09-12 MED ORDER — SODIUM CHLORIDE 0.9 % IV SOLN
INTRAVENOUS | Status: DC
  Administered 2014-09-13 (×2): via INTRAVENOUS

## 2014-09-12 MED ORDER — ALUM & MAG HYDROXIDE-SIMETH 200-200-20 MG/5ML PO SUSP: 30.0000 mL | Freq: Four times a day (QID) | ORAL | Status: DC | PRN

## 2014-09-12 MED ORDER — VITAMIN B-1 100 MG PO TABS
100.0000 mg | ORAL_TABLET | Freq: Every day | ORAL | Status: DC
  Administered 2014-09-12: 100 mg via ORAL
  Filled 2014-09-12: qty 1

## 2014-09-12 MED ORDER — PANTOPRAZOLE SODIUM 40 MG PO TBEC
40.0000 mg | DELAYED_RELEASE_TABLET | Freq: Every day | ORAL | Status: DC
  Administered 2014-09-12: 40 mg via ORAL
  Filled 2014-09-12: qty 1

## 2014-09-12 MED ORDER — PANTOPRAZOLE SODIUM 40 MG IV SOLR
40.0000 mg | Freq: Once | INTRAVENOUS | Status: AC
  Administered 2014-09-12: 40 mg via INTRAVENOUS
  Filled 2014-09-12: qty 40

## 2014-09-12 NOTE — ED Notes (Signed)
Pt in waiting room.  Called 2x no answer.

## 2014-09-12 NOTE — ED Provider Notes (Signed)
CSN: [id number]     Arrival date & time 09/12/14  1258 History   First MD Initiated Contact with Patient 09/12/14 1537     Chief Complaint  Patient presents with  . ETOH detox   . Chest Pain    r/t anxiety per patient     (Consider location/radiation/quality/duration/timing/severity/associated sxs/prior Treatment) HPI Sara [name] is a 38 y.o. female who presents to emergency department requesting alcohol detox and complaining of epigastric abdominal pain. Patient reports daily drinking, for "many many years." States he drinks up to 3 bottles of wine a day. States epigastric pain started 3 days ago. Patient with history of liver cirrhosis, portal hypertension, and bleeding esophageal varices, with banding in July 2015. Patient denies any nausea or vomiting at this time. She has not seen any blood in her emesis she has not had any emesis. She denies any dark or black stools. No bright red blood in her stools. She currently has no complaints. She states her last drink was this morning.  Past Medical History  Diagnosis Date  . Depression   . Pneumonia   . Cirrhosis   . Portal hypertension   . S/P alcohol detoxification     2-3 days at behavioral health previously  . Alcoholism   . Anxiety   . GERD (gastroesophageal reflux disease)   . Esophageal varices with bleeding(456.0) 06/13/2014   Past Surgical History  Procedure Laterality Date  . Cholecystectomy    . Esophagogastroduodenoscopy N/A 06/12/2014    Procedure: ESOPHAGOGASTRODUODENOSCOPY (EGD);  Surgeon: Gatha Mayer, MD;  Location: [facility];  Service: Endoscopy;  Laterality: N/A;  . Esophagogastroduodenoscopy (egd) with propofol N/A 07/29/2014    Procedure: ESOPHAGOGASTRODUODENOSCOPY (EGD) WITH PROPOFOL;  Surgeon: Inda Castle, MD;  Location: [facility];  Service: Endoscopy;  Laterality: N/A;   Family History  Problem Relation Age of Onset  . Colon polyps Mother   . Hypertension Mother   . Thyroid disease Mother   .  Alcoholism Mother   . Alcoholism Father   . Alcohol abuse Maternal Grandfather   . Alcohol abuse Paternal Grandfather   . Alcohol abuse Paternal Aunt   . Alcohol abuse Maternal Uncle    History  Substance Use Topics  . Smoking status: Never Smoker   . Smokeless tobacco: Never Used  . Alcohol Use: 42.0 oz/week    70 Glasses of wine per week     Comment: reports 3 bottle of wine and pint of or more of vodka daily. 2 bottles of wine currently and pint of vodka some days for at least 4-5 days.a month.     OB History   Grav Para Term Preterm Abortions TAB SAB Ect Mult Living            2     Review of Systems  Constitutional: Negative for fever and chills.  Respiratory: Negative for cough, chest tightness and shortness of breath.   Cardiovascular: Negative for chest pain, palpitations and leg swelling.  Gastrointestinal: Positive for abdominal pain. Negative for nausea, vomiting, diarrhea and blood in stool.  Genitourinary: Negative for dysuria, flank pain, vaginal bleeding, vaginal discharge, vaginal pain and pelvic pain.  Musculoskeletal: Negative for myalgias, neck pain and neck stiffness.  Skin: Negative for rash.  Neurological: Negative for dizziness, weakness and headaches.  All other systems reviewed and are negative.     Allergies  Morphine and related; Morphine and related; and Nsaids  Home Medications   Prior to Admission medications   Medication Sig Start  Date End Date Taking? Authorizing Provider  folic acid (FOLVITE) 1 MG tablet Take 1 mg by mouth daily.    Historical Provider, MD  methocarbamol (ROBAXIN) 500 MG tablet Take 1 tablet (500 mg total) by mouth 2 (two) times daily. 08/18/14   Shari A Upstill, PA-C  Multiple Vitamin (MULTIVITAMIN WITH MINERALS) TABS tablet Take 1 tablet by mouth daily. For low vitamin 02/05/14   Encarnacion Slates, NP  nadolol (CORGARD) 40 MG tablet Take 1 tablet (40 mg total) by mouth daily. 06/16/14   Nishant Dhungel, MD  pantoprazole  (PROTONIX) 40 MG tablet Take 40 mg by mouth daily.    Historical Provider, MD   BP 109/65  Pulse 82  Temp(Src) 98.7 F (37.1 C) (Oral)  Resp 17  SpO2 98%  LMP 08/04/2014 Physical Exam  Nursing note and vitals reviewed. Constitutional: She appears well-developed and well-nourished. No distress.  HENT:  Head: Normocephalic.  Eyes: Conjunctivae are normal.  Neck: Neck supple.  Cardiovascular: Normal rate, regular rhythm and normal heart sounds.   Pulmonary/Chest: Effort normal and breath sounds normal. No respiratory distress. She has no wheezes. She has no rales.  Abdominal: Soft. Bowel sounds are normal. She exhibits no distension. There is tenderness. There is no rebound.  Epigastric tenderness  Musculoskeletal: She exhibits no edema.  Neurological: She is alert.  Skin: Skin is warm and dry.  Psychiatric: She has a normal mood and affect. Her behavior is normal.    ED Course  Procedures (including critical care time) Labs Review Labs Reviewed  CBC - Abnormal; Notable for the following:    RBC 2.97 (*)    Hemoglobin 7.3 (*)    HCT 25.5 (*)    MCH 24.6 (*)    MCHC 28.6 (*)    RDW 18.1 (*)    Platelets 56 (*)    All other components within normal limits  BASIC METABOLIC PANEL - Abnormal; Notable for the following:    Glucose, Bld 101 (*)    Creatinine, Ser 0.45 (*)    All other components within normal limits  ETHANOL - Abnormal; Notable for the following:    Alcohol, Ethyl (B) 306 (*)    All other components within normal limits  URINE RAPID DRUG SCREEN (HOSP PERFORMED)  URINALYSIS, ROUTINE W REFLEX MICROSCOPIC  PREGNANCY, URINE  I-STAT TROPOININ, ED  POC OCCULT BLOOD, ED    Imaging Review No results found.   EKG Interpretation   Date/Time:  Friday September 12 2014 13:12:15 EDT Ventricular Rate:  83 PR Interval:  180 QRS Duration: 88 QT Interval:  402 QTC Calculation: 472 R Axis:   33 Text Interpretation:  Normal sinus rhythm No significant change since  last  tracing Confirmed by [place] ([id number]) on 09/12/2014 3:38:00 PM      MDM   Final diagnoses:  Anemia, unspecified anemia type  Alcohol abuse  Alcohol intoxication, uncomplicated  Thrombocytopenia    Patient with alcoholism, history of portal hypertension, cirrhosis, and esophageal varices, here for alcohol detox. She did report several episodes of epigastric pain in the last 3 days. She denies any complaints at this time. Last drink was this morning. Medical clearance labs already obtained the time of triage, patient's hemoglobin is significantly lower from her baseline. She does have history of esophageal varices, with banding in July 2015. She also had a normal EGD one month ago. She denies seeing blood in her stools, she denies dark or tarry stools, she denies any hematemesis. Rectal exam  performed, Hemoccult negative. Patient does admit to have vaginal bleeding but she has not had a period in the last 2 months. Urine pregnancy test is negative.  Patient's vital signs are normal. No acute abdomen, no concern for perforated gastric ulcer. Given history of bleeding esophageal varices, and dropping hemoglobin, will admit for monitoring. Protonic, GI cocktail ordered. IV fluids ordered. Type and screen ordered.  At this time, no evidence of DTs or active bleeding.    Discussed with triad hospitalist who will admit patient.  Filed Vitals:   09/12/14 1645  BP: 109/65  Pulse: 82  Temp: 98.7 F (37.1 C)  TempSrc: Oral  Resp: 17  SpO2: 98%      Renold Genta, PA-C 09/12/14 1731  Renold Genta, PA-C 09/12/14 1731

## 2014-09-12 NOTE — Progress Notes (Signed)
Received report from Leodis Liverpool at [phone].  Pt arrived on 5E at 1850.  VSS.  Pt denies pain.  No signs of distress.  Irven Baltimore, RN

## 2014-09-12 NOTE — ED Notes (Addendum)
Per pt, states she drinks daily-drinks 3 bottles of wine a day-states central chest pain for 3 days-thinks it is related to anxiety-states last drink was at 1100-no history of seizures-2 months sobriety in March

## 2014-09-12 NOTE — Progress Notes (Signed)
MD informed of pt BP 92/48. Pt resting, asymptomatic, pulse 87. Will continue to monitor.

## 2014-09-12 NOTE — H&P (Addendum)
History and Physical:    Sara [name] NFA:[id number] DOB: 1976-05-02 DOA: 09/12/2014  Referring physician: Dr. Sherwood Gambler PCP: Leandrew Koyanagi, MD   Chief Complaint: Requesting detox.  History of Present Illness:   Sara [name] is an 38 y.o. female with a PMH of alcoholism, cirrhosis of the liver diagnosed 01/2014, portal hypertension on Nadolol, esophageal variceal bleeding status post banding 05/2014 who presented to the ER today requesting detox.  Her last alcoholic beverage was at 46:96 a.m.  (2 bottles of wine).  She has tried to D/C ETOH in the past, and has gone through withdrawal but no frank DTs or seizures.  Upon initial evaluation in the ED, the patient was noted to have a hemoglobin of 7.3 mg/dL. No reports of black or bloody stools.  She denies any hematemesis or coffee ground emesis.  She does endorse some abdominal and mid chest pain with no aggravating or alleviating factors.  She takes a PPI daily.  No ASA or NSAID use.  The patient had a normal EGD 07/29/14 done by Dr. Deatra Ina, which was done to assess her esophagus status post banding.  ROS:   Constitutional: + fever, + chills (2 days ago);  Appetite diminished; No weight loss, no weight gain, + fatigue.  HEENT: No blurry vision, no diplopia, no pharyngitis, no dysphagia, +nasal congestion CV: + mid chest pain, + palpitations, + PND, + 3 pillow orthopnea, no edema.  Resp: + SOB, no cough, no pleuritic pain. GI: + nausea, + vomiting, no diarrhea, no melena, no hematochezia, no constipation, + abdominal pain.  GU: No dysuria, no hematuria, no frequency, no urgency. MSK: no myalgias, + back arthralgias.  Neuro:  No headache, no focal neurological deficits, no history of seizures.  Psych: No depression, + anxiety.  Endo: No heat intolerance, no cold intolerance, no polyuria, no polydipsia, LMP 2-3 months ago  Skin: No rashes, no skin lesions.  Heme: + easy bruising.  Travel history: Traveled to [place] in March.   Past Medical  History:   Past Medical History  Diagnosis Date  . Depression   . Pneumonia   . Cirrhosis   . Portal hypertension   . S/P alcohol detoxification     2-3 days at behavioral health previously  . Alcoholism   . Anxiety   . GERD (gastroesophageal reflux disease)   . 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 Menorrhagia   . Pancytopenia 01/15/2014  . UGI bleed 06/12/2014    Past Surgical History:   Past Surgical History  Procedure Laterality Date  . Cholecystectomy    . Esophagogastroduodenoscopy N/A 06/12/2014    Procedure: ESOPHAGOGASTRODUODENOSCOPY (EGD);  Surgeon: Gatha Mayer, MD;  Location: [facility];  Service: Endoscopy;  Laterality: N/A;  . Esophagogastroduodenoscopy (egd) with propofol N/A 07/29/2014    Procedure: ESOPHAGOGASTRODUODENOSCOPY (EGD) WITH PROPOFOL;  Surgeon: Inda Castle, MD;  Location: [facility];  Service: Endoscopy;  Laterality: N/A;    Social History:   History   Social History  . Marital Status: Married    Spouse Name: N/A    Number of Children: 2  . Years of Education: N/A   Occupational History  . Paralegal    Social History Main Topics  . Smoking status: Never Smoker   . Smokeless tobacco: Never Used  . Alcohol Use: 42.0 oz/week    70 Glasses of wine per week     Comment: reports 3 bottle of wine and pint of or more of vodka daily. 2 bottles  of wine currently and pint of vodka some days for at least 4-5 days.a month.    . Drug Use: Yes    Special: Cocaine, Marijuana  . Sexual Activity: Not on file   Other Topics Concern  . Not on file   Social History Narrative   Lives with husband and 2 children. 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    Family history:   Family History  Problem Relation Age of Onset  . Colon polyps Mother   . Hypertension Mother   . Thyroid disease Mother   . Alcoholism Mother   . Alcoholism Father   . Alcohol abuse Maternal Grandfather   . Alcohol abuse Paternal  Grandfather   . Alcohol abuse Paternal Aunt   . Alcohol abuse Maternal Uncle     Allergies   Morphine and related; Morphine and related; and Nsaids  Current Medications:   Prior to Admission medications   Medication Sig Start Date End Date Taking? Authorizing Provider  folic acid (FOLVITE) 1 MG tablet Take 1 mg by mouth daily.    Historical Provider, MD  methocarbamol (ROBAXIN) 500 MG tablet Take 1 tablet (500 mg total) by mouth 2 (two) times daily. 08/18/14   Shari A Upstill, PA-C  Multiple Vitamin (MULTIVITAMIN WITH MINERALS) TABS tablet Take 1 tablet by mouth daily. For low vitamin 02/05/14   Encarnacion Slates, NP  nadolol (CORGARD) 40 MG tablet Take 1 tablet (40 mg total) by mouth daily. 06/16/14   Nishant Dhungel, MD  pantoprazole (PROTONIX) 40 MG tablet Take 40 mg by mouth daily.    Historical Provider, MD    Physical Exam:   Filed Vitals:   09/12/14 1645  BP: 109/65  Pulse: 82  Temp: 98.7 F (37.1 C)  TempSrc: Oral  Resp: 17  SpO2: 98%     Physical Exam: Blood pressure 109/65, pulse 82, temperature 98.7 F (37.1 C), temperature source Oral, resp. rate 17, last menstrual period 08/04/2014, SpO2 98.00%. Gen: No acute distress. Head: Normocephalic, atraumatic. Eyes: PERRL, EOMI, sclerae nonicteric. Mouth: Oropharynx clear with white coating to tongue. Neck: Supple, no thyromegaly, no lymphadenopathy, no jugular venous distention. Chest: Lungs clear to auscultation bilaterally.  CV: Heart sounds are regular and without murmur, rubs, or gallops. Abdomen: Soft, nontender, nondistended with normal active bowel sounds. Extremities: Extremities are without clubbing, edema, or cyanosis. Skin: Warm and dry. Neuro: Alert and oriented times 3; cranial nerves II through XII grossly intact. Psych: Mood and affect mildly anxious.   Data Review:    Labs: Basic Metabolic Panel:  Recent Labs Lab 09/12/14 1453  NA 139  K 4.0  CL 103  CO2 22  GLUCOSE 101*  BUN 8    CREATININE 0.45*  CALCIUM 8.7   Liver Function Tests: No results found for this basename: AST, ALT, ALKPHOS, BILITOT, PROT, ALBUMIN,  in the last 168 hours No results found for this basename: LIPASE, AMYLASE,  in the last 168 hours No results found for this basename: AMMONIA,  in the last 168 hours CBC:  Recent Labs Lab 09/12/14 1453  WBC 4.2  HGB 7.3*  HCT 25.5*  MCV 85.9  PLT 56*   Cardiac Enzymes: No results found for this basename: CKTOTAL, CKMB, CKMBINDEX, TROPONINI,  in the last 168 hours  BNP (last 3 results)  Recent Labs  06/11/14 2315  PROBNP 5.0   CBG: No results found for this basename: GLUCAP,  in the last 168 hours  Radiographic Studies: No results  found.  EKG: Independently reviewed. Sinus rhythm at 83 beats per minute with no ischemic changes.   Assessment/Plan:   Principal Problem:   Anemia in a patient with a history of bleeding esophageal varices and portal hypertension  The patient denies any recent GI bleeding, and has not had her menses in 2-3 months.  Check an anemia panel with reticulocyte count.  Serial hemoglobin checks with a low threshold to initiate a blood transfusion.  Continue GI protection with Protonix and nadolol to reduce risk of recurrent variceal bleeding.  Active Problems:   Cirrhosis with alcoholism / alcohol dependence syndrome  The patient was counseled about the importance of discontinuing alcohol use.  Check LFTs. Check coagulation studies.    GERD (gastroesophageal reflux disease)  Continue PPI therapy.    Abdominal pain  Maybe secondary to alcohol induced gastritis or recurrent pancreatitis.  Check lipase.  PPI therapy ordered.    Thrombocytopenia  Secondary to alcoholism.  Monitor for bleeding.    DVT prophylaxis  High risk of bleeding so SCDs ordered.   Code Status: Full. Family Communication: Husband Leonore Frankson ([phone]) is her emergency contact. Disposition Plan: Home when  stable.  Time spent: 65 minutes.  RAMA,CHRISTINA [facility] Pager [phone] Cell: [phone]   If 7PM-7AM, please contact night-coverage [email or web address] Password [facility] 09/12/2014, 5:43 PM

## 2014-09-12 NOTE — ED Notes (Signed)
Pt ambulatory to hall bed with steady independent gait.

## 2014-09-13 DIAGNOSIS — F1994 Other psychoactive substance use, unspecified with psychoactive substance-induced mood disorder: Secondary | ICD-10-CM

## 2014-09-13 DIAGNOSIS — K852 Alcohol induced acute pancreatitis: Secondary | ICD-10-CM

## 2014-09-13 DIAGNOSIS — D500 Iron deficiency anemia secondary to blood loss (chronic): Secondary | ICD-10-CM

## 2014-09-13 DIAGNOSIS — F10230 Alcohol dependence with withdrawal, uncomplicated: Secondary | ICD-10-CM

## 2014-09-13 LAB — BASIC METABOLIC PANEL
Anion gap: 11 (ref 5–15)
BUN: 9 mg/dL (ref 6–23)
CO2: 24 meq/L (ref 19–32)
CREATININE: 0.4 mg/dL — AB (ref 0.50–1.10)
Calcium: 8.1 mg/dL — ABNORMAL LOW (ref 8.4–10.5)
Chloride: 104 mEq/L (ref 96–112)
GFR calc Af Amer: >90 mL/min (ref >90)
GFR calc non Af Amer: >90 mL/min (ref >90)
Glucose, Bld: 101 mg/dL — ABNORMAL HIGH (ref 70–99)
Potassium: 3.6 mEq/L — ABNORMAL LOW (ref 3.7–5.3)
SODIUM: 139 meq/L (ref 137–147)

## 2014-09-13 LAB — CBC
HEMATOCRIT: 21.4 % — AB (ref 36.0–46.0)
Hemoglobin: 6.2 g/dL — CL (ref 12.0–15.0)
MCH: 24.9 pg — ABNORMAL LOW (ref 26.0–34.0)
MCHC: 29 g/dL — ABNORMAL LOW (ref 30.0–36.0)
MCV: 85.9 fL (ref 78.0–100.0)
Platelets: 41 10*3/uL — ABNORMAL LOW (ref 150–400)
RBC: 2.49 MIL/uL — AB (ref 3.87–5.11)
RDW: 18.1 % — ABNORMAL HIGH (ref 11.5–15.5)
WBC: 2.5 10*3/uL — AB (ref 4.0–10.5)

## 2014-09-13 LAB — VITAMIN B12: Vitamin B-12: 843 pg/mL (ref 211–911)

## 2014-09-13 LAB — FERRITIN: Ferritin: 14 ng/mL (ref 10–291)

## 2014-09-13 LAB — PREPARE RBC (CROSSMATCH)

## 2014-09-13 LAB — FOLATE: FOLATE: 15.8 ng/mL

## 2014-09-13 LAB — HEMOGLOBIN AND HEMATOCRIT, BLOOD
HCT: 25.9 % — ABNORMAL LOW (ref 36.0–46.0)
HEMOGLOBIN: 7.6 g/dL — AB (ref 12.0–15.0)

## 2014-09-13 MED ORDER — SODIUM CHLORIDE 0.9 % IV SOLN
125.0000 mg | Freq: Once | INTRAVENOUS | Status: AC
  Administered 2014-09-13: 125 mg via INTRAVENOUS
  Filled 2014-09-13: qty 10

## 2014-09-13 MED ORDER — LORAZEPAM 0.5 MG PO TABS
0.5000 mg | ORAL_TABLET | Freq: Once | ORAL | Status: AC
  Administered 2014-09-13: 0.5 mg via ORAL
  Filled 2014-09-13: qty 1

## 2014-09-13 MED ORDER — SODIUM CHLORIDE 0.9 % IV SOLN: 25.0000 mg | Freq: Once | INTRAVENOUS | Status: DC

## 2014-09-13 MED ORDER — SODIUM CHLORIDE 0.9 % IV SOLN: Freq: Once | INTRAVENOUS | Status: DC

## 2014-09-13 MED ORDER — POTASSIUM CHLORIDE CRYS ER 20 MEQ PO TBCR
40.0000 meq | EXTENDED_RELEASE_TABLET | Freq: Two times a day (BID) | ORAL | Status: DC
  Administered 2014-09-13 – 2014-09-14 (×3): 40 meq via ORAL
  Filled 2014-09-13 (×6): qty 2

## 2014-09-13 NOTE — Progress Notes (Addendum)
MEDICATION RELATED CONSULT NOTE - INITIAL   Pharmacy Consult for IV iron Indication: iron replacement  Allergies  Allergen Reactions  . Morphine And Related Other (See Comments)    Slowed HR, lowered BP  . Morphine And Related Other (See Comments)    hypotension  . Nsaids     bleeding    Patient Measurements: Height: 5\' 3"  (160 cm) Weight: 181 lb 12.8 oz (82.464 kg) IBW/kg (Calculated) : 52.4  Vital Signs: Temp: 98.4 F (36.9 C) (10/24 1530) Temp Source: Oral (10/24 1530) BP: 113/67 mmHg (10/24 1530) Pulse Rate: 71 (10/24 1530) Intake/Output from previous day: 10/23 0701 - 10/24 0700 In: 1550.4 [I.V.:1550.4] Out: -  Intake/Output from this shift: Total I/O In: 575 [P.O.:240; Blood:335] Out: -   Labs:  Recent Labs  09/12/14 1453 09/12/14 1805 09/13/14 0522 09/13/14 1610  WBC 4.2  --  2.5*  --   HGB 7.3* 7.0* 6.2* 7.6*  HCT 25.5* 24.2* 21.4* 25.9*  PLT 56*  --  41*  --   APTT  --  39*  --   --   CREATININE 0.45*  --  0.40*  --   ALBUMIN  --  3.3*  --   --   PROT  --  7.9  --   --   AST  --  151*  --   --   ALT  --  38*  --   --   ALKPHOS  --  105  --   --   BILITOT  --  1.1  --   --   BILIDIR  --  0.6*  --   --   IBILI  --  0.5  --   --    Estimated Creatinine Clearance: 97.9 ml/min (by C-G formula based on Cr of 0.4).   Microbiology: No results found for this or any previous visit (from the past 720 hour(s)).  Medical History: Past Medical History  Diagnosis Date  . Depression   . Pneumonia   . Cirrhosis   . Portal hypertension   . S/P alcohol detoxification     2-3 days at behavioral health previously  . Alcoholism   . Anxiety   . GERD (gastroesophageal reflux disease)   . 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 Menorrhagia   . Pancytopenia 01/15/2014  . UGI bleed 06/12/2014    Assessment: 10 yoF with PMH of alcoholism, cirrhosis of the liver, esophageal variceal bleeding s/p banding 05/2014 presenting for detox and admitted  for anemia with Hgb 7.3 mg/dL.  Anemia panel showed an iron of 12, TIBC of 554, UIBC of 542 and a ferritin of 14 consistent with iron deficiency.  Pharmacy consulted to dose IV iron.  Goal of Therapy:  Iron replacement  Plan:  1.  Nulecit (Ferric gluconate) 125 mg IV x 1. 2.  Consider ordering Ferrous sulfate 325 mg PO TID following IV replacement.  May also need bowel regimen with start of PO iron.  Hershal Coria 09/13/2014,5:07 PM

## 2014-09-13 NOTE — Progress Notes (Signed)
Progress Note   Sara [name] CBS:[id number] DOB: 01/21/1976 DOA: 09/12/2014 PCP: Leandrew Koyanagi, MD   Brief Narrative:   Sara [name] is an 38 y.o. female with a PMH of alcoholism, cirrhosis of the liver diagnosed 01/2014, portal hypertension on Nadolol, esophageal variceal bleeding status post banding 05/2014 who was admitted 09/12/14 with a chief complaint of requesting detox and she was incidentally found to be anemic with a hemoglobin of 7.3 mg/dL.    Assessment/Plan:   Principal Problem:  Anemia in a patient with a history of bleeding esophageal varices and portal hypertension  The patient denies any recent GI bleeding, and has not had her menses in 2-3 months.  Anemia panel showed an iron of 12, TIBC of 554, UIBC of 542 and a ferritin of 14 consistent with iron deficiency. Received a blood transfusion this morning secondary to drop in hemoglobin to 6.2, decreased to 7.6 post transfusion. We'll need a GI consultation if she continues to drop. Will give IV iron.  Continue GI protection with Protonix and nadolol to reduce risk of recurrent variceal bleeding.  Active Problems: Hypokalemia  Repleting.   Cirrhosis with alcoholism / alcohol dependence syndrome  The patient was counseled about the importance of discontinuing alcohol use.  Continue detox with CIWA protocol. LFTs show AST greater than ALT elevation, consistent with alcohol-induced hepatitis.  GERD (gastroesophageal reflux disease)  Continue PPI therapy.  Abdominal pain/pancreatitis  Maybe secondary to alcohol induced gastritis or recurrent pancreatitis.  Lipase elevation consistent with alcohol-induced pancreatitis. Bowel rest if she continues to have significant abdominal pain in the setting of discontinuation of alcohol  PPI therapy ordered.  Thrombocytopenia  Secondary to alcoholism.  Monitor for bleeding.  DVT prophylaxis  High risk of bleeding so SCDs ordered.  Code Status: Full.  Family  Communication: Husband Kamorah Nevils ([phone]) is her emergency contact.  Disposition Plan: Home when stable.    IV Access:    Peripheral IV   Procedures and diagnostic studies:    None.   Medical Consultants:    None.  Anti-Infectives:    None.  Subjective:   Sara [name] has had some nervousness, anxiety, and vomiting but no hematemesis.  No pain.  Objective:    Filed Vitals:   09/12/14 2204 09/12/14 2322 09/13/14 0427 09/13/14 0616  BP: 92/48 119/52 122/63 108/59  Pulse: 87 98 90 95  Temp: 98.7 F (37.1 C)   98.6 F (37 C)  TempSrc: Oral   Oral  Resp: 16   16  SpO2: 96%   98%    Intake/Output Summary (Last 24 hours) at 09/13/14 3570 Last data filed at 09/13/14 0648  Gross per 24 hour  Intake 1550.42 ml  Output      0 ml  Net 1550.42 ml    Exam: Gen:  NAD Cardiovascular:  RRR, No M/R/G Respiratory:  Lungs CTAB Gastrointestinal:  Abdomen soft, NT/ND, + BS Extremities:  No C/E/C   Data Reviewed:    Labs: Basic Metabolic Panel:  Recent Labs Lab 09/12/14 1453 09/13/14 0522  NA 139 139  K 4.0 3.6*  CL 103 104  CO2 22 24  GLUCOSE 101* 101*  BUN 8 9  CREATININE 0.45* 0.40*  CALCIUM 8.7 8.1*   GFR The CrCl is unknown because both a height and weight (above a minimum accepted value) are required for this calculation. Liver Function Tests:  Recent Labs Lab 09/12/14 1805  AST 151*  ALT 38*  ALKPHOS 105  BILITOT 1.1  PROT 7.9  ALBUMIN 3.3*    Recent Labs Lab 09/12/14 1805  LIPASE 138*   Coagulation profile  Recent Labs Lab 09/12/14 1805  INR 1.19    CBC:  Recent Labs Lab 09/12/14 1453 09/12/14 1805 09/13/14 0522  WBC 4.2  --  2.5*  HGB 7.3* 7.0* 6.2*  HCT 25.5* 24.2* 21.4*  MCV 85.9  --  85.9  PLT 56*  --  41*   Cardiac Enzymes: No results found for this basename: CKTOTAL, CKMB, CKMBINDEX, TROPONINI,  in the last 168 hours BNP (last 3 results)  Recent Labs  06/11/14 2315  PROBNP 5.0   Anemia  work up:  Recent Labs  09/12/14 1805  VITAMINB12 843  FOLATE 15.8  FERRITIN 14  TIBC 554*  IRON 12*  RETICCTPCT 2.4   Sepsis Labs:  Recent Labs Lab 09/12/14 1453 09/13/14 0522  WBC 4.2 2.5*   Microbiology No results found for this or any previous visit (from the past 240 hour(s)).   Medications:   . sodium chloride   Intravenous Once  . [START ON 09/14/2014] LORazepam  0-4 mg Intravenous 4 times per day  . [START ON 09/14/2014] LORazepam  0-4 mg Intravenous Q12H  . LORazepam  0-4 mg Oral 4 times per day  . [START ON 09/14/2014] LORazepam  0-4 mg Oral Q12H  . multivitamin with minerals  1 tablet Oral Daily  . nadolol  40 mg Oral Daily  . pantoprazole  40 mg Oral Daily  . potassium chloride  40 mEq Oral BID  . thiamine  100 mg Oral Daily   Or  . thiamine IV  100 mg Intravenous Daily   Continuous Infusions: . sodium chloride 75 mL/hr at 09/13/14 0426    Time spent: 25 minutes.   LOS: 1 day   Keiko Myricks  [facility] Pager [phone]. If unable to reach me by pager, please call my cell phone at [phone].  *Please refer to [email or web address], password [email or web address] to get updated schedule on who will round on this patient, as hospitalists switch teams weekly. If 7PM-7AM, please contact night-coverage at [email or web address], password [email or web address] for any overnight needs.  09/13/2014, 8:22 AM

## 2014-09-13 NOTE — Progress Notes (Signed)
CRITICAL VALUE ALERT  Critical value received:  hgb 6.2  Date of notification:  10/24  Time of notification:  0650  Critical value read back:Yes.    Nurse who received alert:  Tequan Redmon rn  MD notified (1st page):  york  Time of first page:  0654  MD notified (2nd page):  Time of second page:  Responding MD:  Allean Found  Time MD responded:  [phone]

## 2014-09-14 LAB — BASIC METABOLIC PANEL
Anion gap: 13 (ref 5–15)
BUN: 6 mg/dL (ref 6–23)
CO2: 23 meq/L (ref 19–32)
Calcium: 9.2 mg/dL (ref 8.4–10.5)
Chloride: 98 mEq/L (ref 96–112)
Creatinine, Ser: 0.45 mg/dL — ABNORMAL LOW (ref 0.50–1.10)
GFR calc Af Amer: >90 mL/min (ref >90)
GFR calc non Af Amer: >90 mL/min (ref >90)
GLUCOSE: 98 mg/dL (ref 70–99)
POTASSIUM: 4.3 meq/L (ref 3.7–5.3)
SODIUM: 134 meq/L — AB (ref 137–147)

## 2014-09-14 LAB — CBC
HEMATOCRIT: 27.2 % — AB (ref 36.0–46.0)
Hemoglobin: 8.1 g/dL — ABNORMAL LOW (ref 12.0–15.0)
MCH: 25.2 pg — ABNORMAL LOW (ref 26.0–34.0)
MCHC: 29.8 g/dL — ABNORMAL LOW (ref 30.0–36.0)
MCV: 84.7 fL (ref 78.0–100.0)
Platelets: 47 10*3/uL — ABNORMAL LOW (ref 150–400)
RBC: 3.21 MIL/uL — AB (ref 3.87–5.11)
RDW: 17.2 % — ABNORMAL HIGH (ref 11.5–15.5)
WBC: 3.2 10*3/uL — ABNORMAL LOW (ref 4.0–10.5)

## 2014-09-14 LAB — LIPASE, BLOOD: LIPASE: 116 U/L — AB (ref 11–59)

## 2014-09-14 LAB — MAGNESIUM: MAGNESIUM: 2 mg/dL (ref 1.5–2.5)

## 2014-09-14 NOTE — Discharge Summary (Signed)
Physician Discharge Summary  Sara [name] ZJI:[id number] DOB: [birth year] DOA: 09/12/2014  PCP: Leandrew Koyanagi, MD  Admit date: 09/12/2014 Discharge date: 09/14/2014   Recommendations for Outpatient Follow-Up:   1. F/U CBC in 2 weeks.  Given IV iron while in the hospital.   Discharge Diagnosis:   Principal Problem:    Iron deficiency anemia Active Problems:    Cirrhosis with alcoholism    GERD (gastroesophageal reflux disease)    Portal hypertension    Abdominal pain    Alcohol dependence syndrome    Esophageal varices    Thrombocytopenia    Leukopenia   Discharge Condition: Improved.  Diet recommendation:  Regular.   History of Present Illness:   Sara [name] is an 38 y.o. female with a PMH of alcoholism, cirrhosis of the liver diagnosed 01/2014, portal hypertension on Nadolol, esophageal variceal bleeding status post banding 05/2014 who was admitted 09/12/14 with a chief complaint of requesting detox and she was incidentally found to be anemic with a hemoglobin of 7.3 mg/dL.   Hospital Course by Problem:   Principal Problem:  Iron deficiency anemia in a patient with a history of bleeding esophageal varices and portal hypertension   The patient denied any recent GI bleeding, and had not had her menses in 2-3 months.   Anemia panel showed an iron of 12, TIBC of 554, UIBC of 542 and a ferritin of 14 consistent with iron deficiency.   Received 1 unit of PRBCs 09/13/14 secondary to a drop in her hemoglobin to 6.2, increased to 7.6 post transfusion and is 8.1 today.   Given IV iron replacement with ferric gluconate 125 mg 09/13/14.   Continue GI protection with Protonix and nadolol to reduce risk of recurrent variceal bleeding.  Active Problems:  Hypokalemia   Repleted.  Cirrhosis with alcoholism / alcohol dependence syndrome   The patient was counseled about the importance of discontinuing alcohol use.   She completed an Ativan detox with CIWA  protocol.   LFTs showed AST greater than ALT elevation, consistent with alcohol-induced hepatitis.  The patient plans to follow up with an outpatient alcohol rehab program post-discharge.  GERD (gastroesophageal reflux disease)   Continue PPI therapy.  Abdominal pain/pancreatitis   Pain secondary to alcohol induced gastritis or recurrent pancreatitis.   Lipase elevation consistent with alcohol-induced pancreatitis. Bowel rest if she continues to have significant abdominal pain in the setting of discontinuation of alcohol.  No pain at discharge, tolerating diet.  PPI therapy ordered.  Thrombocytopenia / leukopenia   Secondary to alcohol suppression of bone marrow.  No evidence of significant GI bleeding.   Medical Consultants:    None.   Discharge Exam:   Filed Vitals:   09/14/14 0537  BP: 104/52  Pulse: 84  Temp: 98.6 F (37 C)  Resp: 18   Filed Vitals:   09/13/14 1530 09/13/14 2140 09/14/14 0043 09/14/14 0537  BP: 113/67 113/55 107/66 104/52  Pulse: 71 75 75 84  Temp: 98.4 F (36.9 C) 98.7 F (37.1 C)  98.6 F (37 C)  TempSrc: Oral Oral  Oral  Resp: 16 16  18   Height: 5\' 3"  (1.6 m)     Weight: 82.464 kg (181 lb 12.8 oz)     SpO2: 98% 98%  95%    Gen:  NAD Cardiovascular:  RRR, No M/R/G Respiratory: Lungs CTAB Gastrointestinal: Abdomen soft, NT/ND with normal active bowel sounds. Extremities: No C/E/C   The results of significant diagnostics from this hospitalization (including imaging, microbiology,  ancillary and laboratory) are listed below for reference.     Procedures and Diagnostic Studies:    None.   Labs:   Basic Metabolic Panel:  Recent Labs Lab 09/12/14 1453 09/13/14 0522 09/14/14 0605  NA 139 139 134*  K 4.0 3.6* 4.3  CL 103 104 98  CO2 22 24 23   GLUCOSE 101* 101* 98  BUN 8 9 6   CREATININE 0.45* 0.40* 0.45*  CALCIUM 8.7 8.1* 9.2  MG  --   --  2.0   GFR Estimated Creatinine Clearance: 97.9 ml/min (by C-G formula  based on Cr of 0.45). Liver Function Tests:  Recent Labs Lab 09/12/14 1805  AST 151*  ALT 38*  ALKPHOS 105  BILITOT 1.1  PROT 7.9  ALBUMIN 3.3*    Recent Labs Lab 09/12/14 1805 09/14/14 0605  LIPASE 138* 116*   Coagulation profile  Recent Labs Lab 09/12/14 1805  INR 1.19    CBC:  Recent Labs Lab 09/12/14 1453 09/12/14 1805 09/13/14 0522 09/13/14 1610 09/14/14 0605  WBC 4.2  --  2.5*  --  3.2*  HGB 7.3* 7.0* 6.2* 7.6* 8.1*  HCT 25.5* 24.2* 21.4* 25.9* 27.2*  MCV 85.9  --  85.9  --  84.7  PLT 56*  --  41*  --  47*   Anemia work up  Recent Labs  09/12/14 1805  VITAMINB12 843  FOLATE 15.8  FERRITIN 14  TIBC 554*  IRON 12*  RETICCTPCT 2.4   Microbiology No results found for this or any previous visit (from the past 240 hour(s)).   Discharge Instructions:   Discharge Instructions   Call MD for:  extreme fatigue    Complete by:  As directed      Call MD for:  persistant dizziness or light-headedness    Complete by:  As directed      Call MD for:    Complete by:  As directed   Black or bloody stools.     Diet general    Complete by:  As directed      Discharge instructions    Complete by:  As directed   It is very important that you avoid any further alcohol use, as further use will increase your risk of further permanent liver damage and liver failure.  You were cared for by Dr. Jacquelynn Cree  (a hospitalist) during your hospital stay. If you have any questions about your discharge medications or the care you received while you were in the hospital after you are discharged, you can call the unit and ask to speak with the hospitalist on call if the hospitalist that took care of you is not available. Once you are discharged, your primary care physician will handle any further medical issues. Please note that NO REFILLS for any discharge medications will be authorized once you are discharged, as it is imperative that you return to your primary care  physician (or establish a relationship with a primary care physician if you do not have one) for your aftercare needs so that they can reassess your need for medications and monitor your lab values.  Any outstanding tests can be reviewed by your PCP at your follow up visit.  It is also important to review any medicine changes with your PCP.  Please bring these d/c instructions with you to your next visit so your physician can review these changes with you.     Increase activity slowly    Complete by:  As directed  Medication List         folic acid 1 MG tablet  Commonly known as:  FOLVITE  Take 1 mg by mouth daily.     multivitamin with minerals Tabs tablet  Take 1 tablet by mouth daily. For low vitamin     nadolol 40 MG tablet  Commonly known as:  CORGARD  Take 1 tablet (40 mg total) by mouth daily.     pantoprazole 40 MG tablet  Commonly known as:  PROTONIX  Take 40 mg by mouth daily.     VISINE OP  Apply 1 drop to eye 2 (two) times daily. Both eyes           Follow-up Information   Follow up with DOOLITTLE, Linton Ham, MD In 2 weeks. (To re-check your blood counts.)    Specialties:  Internal Medicine, Adolescent Medicine   Contact information:   [place] [address] [phone]        Time coordinating discharge: 25 minutes.  Signed:  RAMA,CHRISTINA  Pager [phone] [facility] 09/14/2014, 11:53 AM

## 2014-09-14 NOTE — Progress Notes (Signed)
Discharge instructions and medications reviewed with patient.  Patient verbalizes understanding and has no questions at this time. Patient confirms she has all personal belongings in her possession. Patient discharged home with family via private vehicle.

## 2014-09-14 NOTE — Progress Notes (Signed)
Pharmacy - S/P IV Iron  Assessment:  80 yoF with PMH of alcoholism, cirrhosis of the liver, esophageal variceal bleeding s/p banding admitted for detox with Hgb 7.3 mg/dL. Anemia panel consistent with iron deficiency. Pharmacy consulted to dose IV iron.   Plan:  Patient now s/p 125 mg IV iron x 1  MD - Please consider adding PO iron as appropriate or re-consulting for IV iron administration; hematological changes may not be evident for >1 wk.  Also, consider scheduled bowel regimen if giving PO iron  Will sign off  Thank you for this consult, Reuel Boom, PharmD Pager: [phone] 09/14/2014, 9:41 AM

## 2014-09-14 NOTE — Discharge Instructions (Signed)
Finding Treatment for Alcohol and Drug Addiction It can be hard to find the right place to get professional treatment. Here are some important things to consider:  There are different types of treatment to choose from.  Some programs are live-in (residential) while others are not (outpatient). Sometimes a combination is offered.  No single type of program is right for everyone.  Most treatment programs involve a combination of education, counseling, and a 12-step, spiritually-based approach.  There are non-spiritually based programs (not 12-step).  Some treatment programs are government sponsored. They are geared for patients without private insurance.  Treatment programs can vary in many respects such as:  Cost and types of insurance accepted.  Types of on-site medical services offered.  Length of stay, setting, and size.  Overall philosophy of treatment. A person may need specialized treatment or have needs not addressed by all programs. For example, adolescents need treatment appropriate for their age. Other people have secondary disorders that must be managed as well. Secondary conditions can include mental illness, such as depression or diabetes. Often, a period of detoxification from alcohol or drugs is needed. This requires medical supervision and not all programs offer this. THINGS TO CONSIDER WHEN SELECTING A TREATMENT PROGRAM   Is the program certified by the appropriate government agency? Even private programs must be certified and employ certified professionals.  Does the program accept your insurance? If not, can a payment plan be set up?  Is the facility clean, organized, and well run? Do they allow you to speak with graduates who can share their treatment experience with you? Can you tour the facility? Can you meet with staff?  Does the program meet the full range of individual needs?  Does the treatment program address sexual orientation and physical disabilities?  Do they provide age, gender, and culturally appropriate treatment services?  Is treatment available in languages other than English?  Is long-term aftercare support or guidance encouraged and provided?  Is assessment of an individual's treatment plan ongoing to ensure it meets changing needs?  Does the program use strategies to encourage reluctant patients to remain in treatment long enough to increase the likelihood of success?  Does the program offer counseling (individual or group) and other behavioral therapies?  Does the program offer medicine as part of the treatment regimen, if needed?  Is there ongoing monitoring of possible relapse? Is there a defined relapse prevention program? Are services or referrals offered to family members to ensure they understand addiction and the recovery process? This would help them support the recovering individual.  Are 12-step meetings held at the center or is transport available for patients to attend outside meetings? In countries outside of the [place], see local directories for contact information for services in your area. Document Released: 10/06/2005 Document Revised: 01/30/2012 Document Reviewed: 04/17/2008 ExitCare Patient Information 2015 [employer]. This information is not intended to replace advice given to you by your health care provider. Make sure you discuss any questions you have with your health care provider.  

## 2014-09-14 NOTE — ED Provider Notes (Signed)
Medical screening examination/treatment/procedure(s) were performed by non-physician practitioner and as supervising physician I was immediately available for consultation/collaboration.   EKG Interpretation   Date/Time:  Friday September 12 2014 13:12:15 EDT Ventricular Rate:  83 PR Interval:  180 QRS Duration: 88 QT Interval:  402 QTC Calculation: 472 R Axis:   33 Text Interpretation:  Normal sinus rhythm No significant change since last  tracing Confirmed by Luisfernando Brightwell  MD, Dashia Caldeira ([id number]) on 09/12/2014 3:38:00 PM        Ephraim Hamburger, MD 09/14/14 [id number]

## 2014-09-15 LAB — TYPE AND SCREEN
ABO/RH(D): A POS
Antibody Screen: NEGATIVE
UNIT DIVISION: 0

## 2014-09-22 ENCOUNTER — Other Ambulatory Visit: Payer: Self-pay | Admitting: Internal Medicine

## 2014-09-23 ENCOUNTER — Telehealth: Payer: Self-pay | Admitting: *Deleted

## 2014-09-23 NOTE — Telephone Encounter (Signed)
Fax from pharmacy requesting refill of Nadolol 40mg  daily #30

## 2014-09-25 ENCOUNTER — Other Ambulatory Visit: Payer: Self-pay | Admitting: Internal Medicine

## 2014-09-28 NOTE — Telephone Encounter (Signed)
Needs to be refilled by primary care giver.  Can offer 1 refill if she doesn't have a providerr

## 2014-10-03 ENCOUNTER — Encounter (HOSPITAL_COMMUNITY): Payer: Self-pay | Admitting: Emergency Medicine

## 2014-10-03 ENCOUNTER — Emergency Department (HOSPITAL_COMMUNITY)
Admission: EM | Admit: 2014-10-03 | Discharge: 2014-10-04 | Disposition: A | Discharge status: Home / Self Care | Payer: Commercial Managed Care - PPO | Attending: Emergency Medicine | Admitting: Emergency Medicine

## 2014-10-03 DIAGNOSIS — Z8742 Personal history of other diseases of the female genital tract: Secondary | ICD-10-CM | POA: Diagnosis not present

## 2014-10-03 DIAGNOSIS — K219 Gastro-esophageal reflux disease without esophagitis: Secondary | ICD-10-CM | POA: Insufficient documentation

## 2014-10-03 DIAGNOSIS — F1994 Other psychoactive substance use, unspecified with psychoactive substance-induced mood disorder: Secondary | ICD-10-CM

## 2014-10-03 DIAGNOSIS — Z8679 Personal history of other diseases of the circulatory system: Secondary | ICD-10-CM | POA: Diagnosis not present

## 2014-10-03 DIAGNOSIS — R45851 Suicidal ideations: Secondary | ICD-10-CM | POA: Diagnosis present

## 2014-10-03 DIAGNOSIS — Z79899 Other long term (current) drug therapy: Secondary | ICD-10-CM | POA: Insufficient documentation

## 2014-10-03 DIAGNOSIS — F1914 Other psychoactive substance abuse with psychoactive substance-induced mood disorder: Secondary | ICD-10-CM | POA: Insufficient documentation

## 2014-10-03 DIAGNOSIS — F10220 Alcohol dependence with intoxication, uncomplicated: Secondary | ICD-10-CM | POA: Insufficient documentation

## 2014-10-03 DIAGNOSIS — Z8701 Personal history of pneumonia (recurrent): Secondary | ICD-10-CM | POA: Diagnosis not present

## 2014-10-03 DIAGNOSIS — Z3202 Encounter for pregnancy test, result negative: Secondary | ICD-10-CM | POA: Insufficient documentation

## 2014-10-03 DIAGNOSIS — Z862 Personal history of diseases of the blood and blood-forming organs and certain disorders involving the immune mechanism: Secondary | ICD-10-CM | POA: Insufficient documentation

## 2014-10-03 DIAGNOSIS — F419 Anxiety disorder, unspecified: Secondary | ICD-10-CM | POA: Diagnosis not present

## 2014-10-03 LAB — ACETAMINOPHEN LEVEL: Acetaminophen (Tylenol), Serum: <15 ug/mL (ref 10–30)

## 2014-10-03 LAB — RAPID URINE DRUG SCREEN, HOSP PERFORMED
AMPHETAMINES: NOT DETECTED
BARBITURATES: NOT DETECTED
BENZODIAZEPINES: NOT DETECTED
Cocaine: NOT DETECTED
Opiates: NOT DETECTED
Tetrahydrocannabinol: NOT DETECTED

## 2014-10-03 LAB — COMPREHENSIVE METABOLIC PANEL
ALT: 64 U/L — AB (ref 0–35)
AST: 173 U/L — AB (ref 0–37)
Albumin: 3.6 g/dL (ref 3.5–5.2)
Alkaline Phosphatase: 113 U/L (ref 39–117)
Anion gap: 18 — ABNORMAL HIGH (ref 5–15)
BUN: 7 mg/dL (ref 6–23)
CO2: 22 meq/L (ref 19–32)
Calcium: 8.9 mg/dL (ref 8.4–10.5)
Chloride: 105 mEq/L (ref 96–112)
Creatinine, Ser: 0.58 mg/dL (ref 0.50–1.10)
GFR calc Af Amer: >90 mL/min (ref >90)
Glucose, Bld: 120 mg/dL — ABNORMAL HIGH (ref 70–99)
Potassium: 4.1 mEq/L (ref 3.7–5.3)
Sodium: 145 mEq/L (ref 137–147)
Total Bilirubin: 1.4 mg/dL — ABNORMAL HIGH (ref 0.3–1.2)
Total Protein: 8.9 g/dL — ABNORMAL HIGH (ref 6.0–8.3)

## 2014-10-03 LAB — ETHANOL: Alcohol, Ethyl (B): 378 mg/dL — ABNORMAL HIGH (ref 0–11)

## 2014-10-03 LAB — CBC
HCT: 33.8 % — ABNORMAL LOW (ref 36.0–46.0)
HEMOGLOBIN: 10.1 g/dL — AB (ref 12.0–15.0)
MCH: 25.6 pg — ABNORMAL LOW (ref 26.0–34.0)
MCHC: 29.9 g/dL — ABNORMAL LOW (ref 30.0–36.0)
MCV: 85.6 fL (ref 78.0–100.0)
PLATELETS: 129 10*3/uL — AB (ref 150–400)
RBC: 3.95 MIL/uL (ref 3.87–5.11)
RDW: 18.5 % — ABNORMAL HIGH (ref 11.5–15.5)
WBC: 6 10*3/uL (ref 4.0–10.5)

## 2014-10-03 LAB — SALICYLATE LEVEL: Salicylate Lvl: <2 mg/dL — ABNORMAL LOW (ref 2.8–20.0)

## 2014-10-03 LAB — PREGNANCY, URINE: PREG TEST UR: NEGATIVE

## 2014-10-03 MED ORDER — ALUM & MAG HYDROXIDE-SIMETH 200-200-20 MG/5ML PO SUSP
30.0000 mL | ORAL | Status: DC | PRN
Start: 2014-10-03 — End: 2014-10-04

## 2014-10-03 MED ORDER — LOPERAMIDE HCL 2 MG PO CAPS
2.0000 mg | ORAL_CAPSULE | ORAL | Status: DC | PRN
Start: 2014-10-03 — End: 2014-10-04

## 2014-10-03 MED ORDER — ONDANSETRON 4 MG PO TBDP
4.0000 mg | ORAL_TABLET | Freq: Four times a day (QID) | ORAL | Status: DC | PRN
  Administered 2014-10-04 (×2): 4 mg via ORAL
  Filled 2014-10-03 (×2): qty 1

## 2014-10-03 MED ORDER — LORAZEPAM 1 MG PO TABS
1.0000 mg | ORAL_TABLET | Freq: Once | ORAL | Status: AC
  Administered 2014-10-03: 1 mg via ORAL
  Filled 2014-10-03: qty 1

## 2014-10-03 MED ORDER — ONDANSETRON HCL 4 MG PO TABS: 4.0000 mg | ORAL_TABLET | Freq: Three times a day (TID) | ORAL | Status: DC | PRN

## 2014-10-03 MED ORDER — ADULT MULTIVITAMIN W/MINERALS CH
1.0000 | ORAL_TABLET | Freq: Every day | ORAL | Status: DC
  Administered 2014-10-04: 1 via ORAL
  Filled 2014-10-03: qty 1

## 2014-10-03 MED ORDER — THIAMINE HCL 100 MG/ML IJ SOLN: 100.0000 mg | Freq: Once | INTRAMUSCULAR | Status: AC

## 2014-10-03 MED ORDER — NICOTINE 21 MG/24HR TD PT24: 21.0000 mg | MEDICATED_PATCH | Freq: Every day | TRANSDERMAL | Status: DC

## 2014-10-03 MED ORDER — HYDROXYZINE HCL 25 MG PO TABS
25.0000 mg | ORAL_TABLET | Freq: Four times a day (QID) | ORAL | Status: DC | PRN
  Administered 2014-10-03 – 2014-10-04 (×2): 25 mg via ORAL
  Filled 2014-10-03 (×2): qty 1

## 2014-10-03 MED ORDER — LORAZEPAM 1 MG PO TABS
1.0000 mg | ORAL_TABLET | Freq: Four times a day (QID) | ORAL | Status: DC | PRN
  Administered 2014-10-03 – 2014-10-04 (×2): 1 mg via ORAL
  Filled 2014-10-03 (×2): qty 1

## 2014-10-03 MED ORDER — VITAMIN B-1 100 MG PO TABS
100.0000 mg | ORAL_TABLET | Freq: Every day | ORAL | Status: DC
  Administered 2014-10-04: 100 mg via ORAL
  Filled 2014-10-03: qty 1

## 2014-10-03 NOTE — ED Notes (Signed)
MD at bedside. 

## 2014-10-03 NOTE — ED Notes (Signed)
Bed: [facility] Expected date:  Expected time:  Means of arrival:  Comments: EMS-SI/ETOH

## 2014-10-03 NOTE — ED Provider Notes (Signed)
CSN: [id number]     Arrival date & time 10/03/14  1405 History   First MD Initiated Contact with Patient 10/03/14 1539     Chief Complaint  Patient presents with  . ETOH/sucide      (Consider location/radiation/quality/duration/timing/severity/associated sxs/prior Treatment) HPI  This is a 38 y/o female with a pmh of depression, chronic alcoholism, cirrhosis, portal htn, previous etoh detox who presents for depression, SI, and etoh abuse. The patient states that she drinks 2-3 bottles of wine daily. She states that she has been very depressed and is having thoughts of passive suicidal ideation without a plan. She has audio hallucinations when she has been drinking heavily. She denies HI. She has not used any other substances. Last etoh intake was this morning. She does get the shakes when she does not drink but denies having seizures.   Past Medical History  Diagnosis Date  . Depression   . Pneumonia   . Cirrhosis   . Portal hypertension   . S/P alcohol detoxification     2-3 days at behavioral health previously  . Alcoholism   . Anxiety   . GERD (gastroesophageal reflux disease)   . 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 Menorrhagia   . Pancytopenia 01/15/2014  . UGI bleed 06/12/2014   Past Surgical History  Procedure Laterality Date  . Cholecystectomy    . Esophagogastroduodenoscopy N/A 06/12/2014    Procedure: ESOPHAGOGASTRODUODENOSCOPY (EGD);  Surgeon: Gatha Mayer, MD;  Location: [facility];  Service: Endoscopy;  Laterality: N/A;  . Esophagogastroduodenoscopy (egd) with propofol N/A 07/29/2014    Procedure: ESOPHAGOGASTRODUODENOSCOPY (EGD) WITH PROPOFOL;  Surgeon: Inda Castle, MD;  Location: [facility];  Service: Endoscopy;  Laterality: N/A;   Family History  Problem Relation Age of Onset  . Colon polyps Mother   . Hypertension Mother   . Thyroid disease Mother   . Alcoholism Mother   . Alcoholism Father   . Alcohol abuse Maternal Grandfather    . Alcohol abuse Paternal Grandfather   . Alcohol abuse Paternal Aunt   . Alcohol abuse Maternal Uncle    History  Substance Use Topics  . Smoking status: Never Smoker   . Smokeless tobacco: Never Used  . Alcohol Use: 42.0 oz/week    70 Glasses of wine per week     Comment: reports 3 bottle of wine and pint of or more of vodka daily. 2 bottles of wine currently and pint of vodka some days for at least 4-5 days.a month.     OB History    Gravida Para Term Preterm AB TAB SAB Ectopic Multiple Living            2     Review of Systems  Ten systems reviewed and are negative for acute change, except as noted in the HPI.    Allergies  Morphine and related; Morphine and related; and Nsaids  Home Medications   Prior to Admission medications   Medication Sig Start Date End Date Taking? Authorizing Provider  folic acid (FOLVITE) 1 MG tablet Take 1 mg by mouth daily.   Yes Historical Provider, MD  Multiple Vitamin (MULTIVITAMIN WITH MINERALS) TABS tablet Take 1 tablet by mouth daily. For low vitamin 02/05/14  Yes Encarnacion Slates, NP  nadolol (CORGARD) 40 MG tablet Take 1 tablet (40 mg total) by mouth daily. 06/16/14  Yes Nishant Dhungel, MD  pantoprazole (PROTONIX) 40 MG tablet Take 40 mg by mouth daily.   Yes Historical Provider, MD  Tetrahydrozoline  HCl (VISINE OP) Place 1 drop into both eyes 2 (two) times daily.    Yes Historical Provider, MD   BP 126/75 mmHg  Pulse 83  Temp(Src) 98.2 F (36.8 C) (Oral)  Resp 20  SpO2 94%  LMP 08/20/2014 Physical Exam  Constitutional: She is oriented to person, place, and time. She appears well-developed and well-nourished. No distress.  Moon facies with telangectasia  HENT:  Head: Normocephalic and atraumatic.  Eyes: Conjunctivae are normal. No scleral icterus.  Neck: Normal range of motion.  Cardiovascular: Normal rate, regular rhythm and normal heart sounds.  Exam reveals no gallop and no friction rub.   No murmur heard. Pulmonary/Chest:  Effort normal and breath sounds normal. No respiratory distress.  Abdominal: Soft. Bowel sounds are normal. She exhibits distension. She exhibits no mass. There is no tenderness. There is no guarding.  Protuberant abdomen  Neurological: She is alert and oriented to person, place, and time.  Skin: Skin is warm and dry. She is not diaphoretic.  Nursing note and vitals reviewed.   ED Course  Procedures (including critical care time) Labs Review Labs Reviewed  CBC - Abnormal; Notable for the following:    Hemoglobin 10.1 (*)    HCT 33.8 (*)    MCH 25.6 (*)    MCHC 29.9 (*)    RDW 18.5 (*)    Platelets 129 (*)    All other components within normal limits  ACETAMINOPHEN LEVEL  COMPREHENSIVE METABOLIC PANEL  ETHANOL  SALICYLATE LEVEL  URINE RAPID DRUG SCREEN (HOSP PERFORMED)  PREGNANCY, URINE    Imaging Review No results found.   EKG Interpretation None      MDM   Final diagnoses:  Substance induced mood disorder  Alcohol dependence with acute alcoholic intoxication, uncomplicated    7:78 PM BP 126/75 mmHg  Pulse 83  Temp(Src) 98.2 F (36.8 C) (Oral)  Resp 20  SpO2 94%  LMP 08/20/2014 Patient with ETOH abuse, passive SI seeking detox. She appears stable. No tremors. Lab work pending.  5:44 PM BP 126/75 mmHg  Pulse 83  Temp(Src) 98.2 F (36.8 C) (Oral)  Resp 20  SpO2 94%  LMP 08/20/2014 Patient is clinically sober with ETOH of 378. I have ordered PO ativan   Patient stable on CIWA TTS consult complete and reccomends IP treatment.    Margarita Mail, PA-C 10/04/14 Pablo Pena, MD 10/04/14 [phone]

## 2014-10-03 NOTE — ED Notes (Signed)
[facility] RN MADE AWARE OF ETOH LEVEL 380 AND CIWA. ABIGAL EDPA AWARE OK FOR TRANSPORT
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
## 2014-10-03 NOTE — ED Notes (Signed)
Per EMS pt reported to have called husband while he was at work and saying things to him like "nobody loves me" and "i am just going to end it". Husband called 72 because wife sounded drunk and was threatening suicide. When EMS arrived she denied SI but after talking with husband she decided to go to the hospital. Pt is dening SI with EMS.

## 2014-10-03 NOTE — BH Assessment (Signed)
Assessment completed. Consulted Waylan Boga, NP who recommended inpatient treatment. No available beds at [facility]. TTS will contact other facilities for placement. Margarita Mail, PA-C has been informed of the recommendation.

## 2014-10-04 ENCOUNTER — Inpatient Hospital Stay (HOSPITAL_COMMUNITY): Admission: AD | Admit: 2014-10-04 | Payer: PRIVATE HEALTH INSURANCE | Source: Intra-hospital | Admitting: Psychiatry

## 2014-10-04 DIAGNOSIS — F1024 Alcohol dependence with alcohol-induced mood disorder: Secondary | ICD-10-CM | POA: Insufficient documentation

## 2014-10-04 MED ORDER — LORAZEPAM 1 MG PO TABS: 1.0000 mg | ORAL_TABLET | Freq: Three times a day (TID) | ORAL | Status: DC

## 2014-10-04 MED ORDER — ESCITALOPRAM OXALATE 5 MG PO TABS: 10.0000 mg | ORAL_TABLET | Freq: Every day | ORAL | Status: DC

## 2014-10-04 MED ORDER — LORAZEPAM 1 MG PO TABS
1.0000 mg | ORAL_TABLET | Freq: Four times a day (QID) | ORAL | Status: DC
  Administered 2014-10-04 (×2): 1 mg via ORAL
  Filled 2014-10-04 (×2): qty 1

## 2014-10-04 MED ORDER — ADULT MULTIVITAMIN W/MINERALS CH: 1.0000 | ORAL_TABLET | Freq: Every day | ORAL | Status: DC

## 2014-10-04 MED ORDER — ESCITALOPRAM OXALATE 10 MG PO TABS
5.0000 mg | ORAL_TABLET | Freq: Every day | ORAL | Status: DC
  Administered 2014-10-04: 5 mg via ORAL
  Filled 2014-10-04: qty 1

## 2014-10-04 MED ORDER — LORAZEPAM 1 MG PO TABS: 1.0000 mg | ORAL_TABLET | Freq: Every day | ORAL | Status: DC

## 2014-10-04 MED ORDER — LORAZEPAM 1 MG PO TABS: 1.0000 mg | ORAL_TABLET | Freq: Two times a day (BID) | ORAL | Status: DC
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
## 2014-10-04 NOTE — [facility] Suicide Risk Assessment (Signed)
Suicide Risk Assessment  Discharge Assessment     Demographic Factors:  NA  Total Time spent with patient: 30 minutes Psychiatric Specialty Exam:     Blood pressure 128/72, pulse 61, temperature 98.2 F (36.8 C), temperature source Oral, resp. rate 16, height 5\' 3"  (1.6 m), weight 82.101 kg (181 lb), last menstrual period 08/20/2014, SpO2 99 %.Body mass index is 32.07 kg/(m^2).  General Appearance: Fairly Groomed  Engineer, water:: Good  Speech: Normal Rate  Volume: Decreased  Mood: Depressed  Affect: Constricted  Thought Process: Goal Directed and Linear  Orientation: Other: fully alert and attentive  Thought Content: no hallucinations, no delusions  Suicidal Thoughts: No At this time denies any suicidal plan or intent and contracts for safety   Homicidal Thoughts: No  Memory: Recent and Remote grossly intact  Judgement: Fair  Insight: Fair  Psychomotor Activity: Normal  Concentration: Good  Recall: Good  Fund of Knowledge:Good  Language: Good  Akathisia: Negative  Handed: Right  AIMS (if indicated):    Assets: Desire for Improvement Housing Resilience  Sleep:     Musculoskeletal: Strength & Muscle Tone: within normal limits- vitals stable , mild distal tremors noted, not agitated  Gait & Station:gait not examined Patient leans: N/A     Mental Status Per Nursing Assessment::   On Admission:   acutely intoxicated bac 378  States SI thoughts   Current Mental Status by Physician: denies suicidal ideation now that she is sober.  states she just wants to go home and try sobriety  Loss Factors: NA  Historical Factors: NA  Risk Reduction Factors:   Sense of responsibility to family, Positive therapeutic relationship and Positive coping skills or problem solving skills  Continued Clinical Symptoms:  Alcohol/Substance Abuse/Dependencies  Cognitive Features That Contribute To Risk:  none  Suicide Risk:   Minimal: No identifiable suicidal ideation.  Patients presenting with no risk factors but with morbid ruminations; may be classified as minimal risk based on the severity of the depressive symptoms  Discharge Diagnoses:   AXIS I:  Substance Induced Mood Disorder AXIS II:  Deferred AXIS III:   Past Medical History  Diagnosis Date  . Depression   . Pneumonia   . Cirrhosis   . Portal hypertension   . S/P alcohol detoxification     2-3 days at behavioral health previously  . Alcoholism   . Anxiety   . GERD (gastroesophageal reflux disease)   . 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 Menorrhagia   . Pancytopenia 01/15/2014  . UGI bleed 06/12/2014   AXIS IV:  other psychosocial or environmental problems AXIS V:  61-70 mild symptoms  Plan Of Care/Follow-up recommendations:  Activity:  as tolerated Diet:  heart healthy well balanced meals  Is patient on multiple antipsychotic therapies at discharge:  No   Has Patient had three or more failed trials of antipsychotic monotherapy by history:  No  Recommended Plan for Multiple Antipsychotic Therapies: NA    Kennedy Bucker PMHNP-BC 10/04/2014, 2:00 PM   Patient discussed with me as above .

## 2014-10-04 NOTE — Progress Notes (Signed)
Per Shalita patient was accepted to [facility] 502-1.  CSW informed nursing staff of the updated disposition and call report to [phone].    Chesley Noon, MSW, [place] Clinical Social Worker [phone]

## 2014-10-04 NOTE — ED Notes (Signed)
D/C instructions and new prescription for Lexapro given and explained. Stated understanding. Encouraged to go straight to [facility] as she states this is her plan. Husband is picking her up to transport. Explained the seriousness of ETOH withdrawal and the importance of going for treatment today. States understanding. Denies pain. No complaints voiced. Gave Ativan 1 mg right before D/C per Darrick Penna NP recommendation. CIWA 2. Ambulatory without difficulty. Denies SI, HI, AVH. Belongings bag x1 returned. Escorted by mental health tech to front of hospital.

## 2014-10-04 NOTE — Consult Note (Signed)
  Sara [name],  Is a 38 year old african [place] female who came in acutely intoxicated.  Patient denies current suicidal ideation, homicidal ideation.  Denies Auditory or visual hallucinations.  Patient states that she no long wants to complete detoxification and would like to try to obtain sobriety on an outpatient basis.  Explained the risks of withdrawal to include the risk of seizures, dt's and hallucinations.  Patient is aware of these con sequences and requests to discharge.  As she is not currently suicidal, homicidal we have no grounds to retain her under IVC.  Encouraged her to promptly return to the emergency department is she experiences worsening depression or withdrawal symptoms.    Patient seen and evaluated with NP as above

## 2014-10-04 NOTE — ED Notes (Signed)
Patient complains of nausea. Respirations equal and unlabored, skin warm and dry, No acute distress noted. Patient will be medicated per MAR. Will continue to monitor patient.

## 2014-10-06 ENCOUNTER — Other Ambulatory Visit: Payer: Self-pay | Admitting: *Deleted

## 2014-10-06 DIAGNOSIS — K766 Portal hypertension: Secondary | ICD-10-CM

## 2014-10-06 MED ORDER — NADOLOL 40 MG PO TABS: 40.0000 mg | ORAL_TABLET | Freq: Every day | ORAL | Status: DC

## 2014-10-06 NOTE — Telephone Encounter (Signed)
Spoke with patient - she does want the refill and she will follow up with her primary care.

## 2014-11-04 ENCOUNTER — Emergency Department (HOSPITAL_COMMUNITY)
Admission: EM | Admit: 2014-11-04 | Discharge: 2014-11-05 | Disposition: A | Discharge status: Other Acute Inpatient Hospital | Payer: 59 | Attending: Emergency Medicine | Admitting: Emergency Medicine

## 2014-11-04 ENCOUNTER — Encounter (HOSPITAL_COMMUNITY): Payer: Self-pay | Admitting: Emergency Medicine

## 2014-11-04 ENCOUNTER — Emergency Department (HOSPITAL_COMMUNITY): Payer: 59

## 2014-11-04 DIAGNOSIS — Z8701 Personal history of pneumonia (recurrent): Secondary | ICD-10-CM | POA: Insufficient documentation

## 2014-11-04 DIAGNOSIS — Z862 Personal history of diseases of the blood and blood-forming organs and certain disorders involving the immune mechanism: Secondary | ICD-10-CM | POA: Diagnosis not present

## 2014-11-04 DIAGNOSIS — Z79899 Other long term (current) drug therapy: Secondary | ICD-10-CM | POA: Diagnosis not present

## 2014-11-04 DIAGNOSIS — F1024 Alcohol dependence with alcohol-induced mood disorder: Secondary | ICD-10-CM | POA: Diagnosis not present

## 2014-11-04 DIAGNOSIS — F419 Anxiety disorder, unspecified: Secondary | ICD-10-CM | POA: Diagnosis not present

## 2014-11-04 DIAGNOSIS — Z8719 Personal history of other diseases of the digestive system: Secondary | ICD-10-CM | POA: Insufficient documentation

## 2014-11-04 DIAGNOSIS — K219 Gastro-esophageal reflux disease without esophagitis: Secondary | ICD-10-CM | POA: Insufficient documentation

## 2014-11-04 DIAGNOSIS — F1010 Alcohol abuse, uncomplicated: Secondary | ICD-10-CM | POA: Diagnosis present

## 2014-11-04 DIAGNOSIS — R079 Chest pain, unspecified: Secondary | ICD-10-CM | POA: Diagnosis not present

## 2014-11-04 DIAGNOSIS — Z87448 Personal history of other diseases of urinary system: Secondary | ICD-10-CM | POA: Diagnosis not present

## 2014-11-04 LAB — COMPREHENSIVE METABOLIC PANEL
ALK PHOS: 115 U/L (ref 39–117)
ALT: 46 U/L — ABNORMAL HIGH (ref 0–35)
AST: 179 U/L — AB (ref 0–37)
Albumin: 3.5 g/dL (ref 3.5–5.2)
Anion gap: 15 (ref 5–15)
BILIRUBIN TOTAL: 1.3 mg/dL — AB (ref 0.3–1.2)
BUN: 6 mg/dL (ref 6–23)
CHLORIDE: 102 meq/L (ref 96–112)
CO2: 23 mEq/L (ref 19–32)
Calcium: 8.8 mg/dL (ref 8.4–10.5)
Creatinine, Ser: 0.39 mg/dL — ABNORMAL LOW (ref 0.50–1.10)
GFR calc Af Amer: >90 mL/min (ref >90)
GFR calc non Af Amer: >90 mL/min (ref >90)
Glucose, Bld: 111 mg/dL — ABNORMAL HIGH (ref 70–99)
POTASSIUM: 4.2 meq/L (ref 3.7–5.3)
Sodium: 140 mEq/L (ref 137–147)
TOTAL PROTEIN: 8.5 g/dL — AB (ref 6.0–8.3)

## 2014-11-04 LAB — CBC
HCT: 32.9 % — ABNORMAL LOW (ref 36.0–46.0)
Hemoglobin: 10 g/dL — ABNORMAL LOW (ref 12.0–15.0)
MCH: 27.2 pg (ref 26.0–34.0)
MCHC: 30.4 g/dL (ref 30.0–36.0)
MCV: 89.4 fL (ref 78.0–100.0)
PLATELETS: 53 10*3/uL — AB (ref 150–400)
RBC: 3.68 MIL/uL — ABNORMAL LOW (ref 3.87–5.11)
RDW: 20.5 % — AB (ref 11.5–15.5)
WBC: 4.9 10*3/uL (ref 4.0–10.5)

## 2014-11-04 LAB — RAPID URINE DRUG SCREEN, HOSP PERFORMED
Amphetamines: NOT DETECTED
BARBITURATES: NOT DETECTED
BENZODIAZEPINES: NOT DETECTED
COCAINE: NOT DETECTED
Opiates: NOT DETECTED
Tetrahydrocannabinol: NOT DETECTED

## 2014-11-04 LAB — ETHANOL: ALCOHOL ETHYL (B): 372 mg/dL — AB (ref 0–11)

## 2014-11-04 LAB — TROPONIN I: Troponin I: <0.3 ng/mL (ref <0.30)

## 2014-11-04 LAB — SALICYLATE LEVEL: Salicylate Lvl: <2 mg/dL — ABNORMAL LOW (ref 2.8–20.0)

## 2014-11-04 LAB — ACETAMINOPHEN LEVEL

## 2014-11-04 MED ORDER — TRAZODONE HCL 50 MG PO TABS
50.0000 mg | ORAL_TABLET | Freq: Every evening | ORAL | Status: DC | PRN
  Administered 2014-11-04: 50 mg via ORAL
  Filled 2014-11-04: qty 1

## 2014-11-04 MED ORDER — VITAMIN B-1 100 MG PO TABS
100.0000 mg | ORAL_TABLET | Freq: Every day | ORAL | Status: DC
Start: 2014-11-04 — End: 2014-11-05
  Administered 2014-11-04 – 2014-11-05 (×2): 100 mg via ORAL
  Filled 2014-11-04 (×2): qty 1

## 2014-11-04 MED ORDER — ONDANSETRON 4 MG PO TBDP
4.0000 mg | ORAL_TABLET | Freq: Three times a day (TID) | ORAL | Status: DC | PRN
  Administered 2014-11-04 – 2014-11-05 (×3): 4 mg via ORAL
  Filled 2014-11-04 (×3): qty 1

## 2014-11-04 MED ORDER — ZOLPIDEM TARTRATE 5 MG PO TABS: 5.0000 mg | ORAL_TABLET | Freq: Every evening | ORAL | Status: DC | PRN

## 2014-11-04 MED ORDER — LORAZEPAM 1 MG PO TABS
0.0000 mg | ORAL_TABLET | Freq: Four times a day (QID) | ORAL | Status: DC
  Administered 2014-11-04: 2 mg via ORAL
  Administered 2014-11-04 – 2014-11-05 (×3): 1 mg via ORAL
  Filled 2014-11-04: qty 1
  Filled 2014-11-04: qty 2
  Filled 2014-11-04 (×2): qty 1

## 2014-11-04 MED ORDER — THIAMINE HCL 100 MG/ML IJ SOLN: 100.0000 mg | Freq: Every day | INTRAMUSCULAR | Status: DC

## 2014-11-04 MED ORDER — LORAZEPAM 1 MG PO TABS: 0.0000 mg | ORAL_TABLET | Freq: Two times a day (BID) | ORAL | Status: DC

## 2014-11-04 NOTE — ED Notes (Signed)
Patient admits to drinking 2 bottles of wine today around 12pm and 1 mg of Kolonapin

## 2014-11-04 NOTE — ED Notes (Signed)
Patient and belongings have been wanded, belongings stored at nurses desk.

## 2014-11-04 NOTE — ED Notes (Signed)
Attempt to call report to Psych ED, RN is unavailable will contact triage when ready.

## 2014-11-04 NOTE — ED Notes (Signed)
Patient denies SI, HI, AVH. Rates feelings of anxiety and depression 8/10. Patient reports increasing anxiety and nausea. States last drink was approximately 1500 today.   Encouragement offered. Gatorade, Ativan, B1 given.  Q 15 safety checks in place on the unit.

## 2014-11-04 NOTE — ED Provider Notes (Signed)
CSN: [id number]     Arrival date & time 11/04/14  1650 History   First MD Initiated Contact with Patient 11/04/14 1738     Chief Complaint  Patient presents with  . Drug / Alcohol Assessment     (Consider location/radiation/quality/duration/timing/severity/associated sxs/prior Treatment) HPI  38 year old female presents with wanting detox from alcohol. She states she has been drinking daily for several months. She was sober for 4 months this year but then relapsed. She's been having a lot of issues from alcohol including cirrhosis and a history of esophageal varices with bleeding. Denies any recent illness including no hematemesis or melena or hematochezia. She drinks 2-3 bottles 1 per day. Has been waking up with tremors and shakes they go away when she drinks each morning. For the past 4 days has also been waking up with chest pain that she describes as a sharp stabbing pain. This also goes away with drinking alcohol. She denies any this pain now. No shortness of breath or cough. No fevers. She denies any SI or HI.  Past Medical History  Diagnosis Date  . Depression   . Pneumonia   . Cirrhosis   . Portal hypertension   . S/P alcohol detoxification     2-3 days at behavioral health previously  . Alcoholism   . Anxiety   . GERD (gastroesophageal reflux disease)   . 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 Menorrhagia   . Pancytopenia 01/15/2014  . UGI bleed 06/12/2014   Past Surgical History  Procedure Laterality Date  . Cholecystectomy    . Esophagogastroduodenoscopy N/A 06/12/2014    Procedure: ESOPHAGOGASTRODUODENOSCOPY (EGD);  Surgeon: Gatha Mayer, MD;  Location: [facility];  Service: Endoscopy;  Laterality: N/A;  . Esophagogastroduodenoscopy (egd) with propofol N/A 07/29/2014    Procedure: ESOPHAGOGASTRODUODENOSCOPY (EGD) WITH PROPOFOL;  Surgeon: Inda Castle, MD;  Location: [facility];  Service: Endoscopy;  Laterality: N/A;   Family History  Problem  Relation Age of Onset  . Colon polyps Mother   . Hypertension Mother   . Thyroid disease Mother   . Alcoholism Mother   . Alcoholism Father   . Alcohol abuse Maternal Grandfather   . Alcohol abuse Paternal Grandfather   . Alcohol abuse Paternal Aunt   . Alcohol abuse Maternal Uncle    History  Substance Use Topics  . Smoking status: Never Smoker   . Smokeless tobacco: Never Used  . Alcohol Use: 42.0 oz/week    70 Glasses of wine per week     Comment: reports 3 bottle of wine and pint of or more of vodka daily. 2 bottles of wine currently and pint of vodka some days for at least 4-5 days.a month.     OB History    Gravida Para Term Preterm AB TAB SAB Ectopic Multiple Living            2     Review of Systems  Respiratory: Negative for shortness of breath.   Cardiovascular: Positive for chest pain. Negative for leg swelling.  Gastrointestinal: Negative for vomiting and blood in stool.  Neurological: Positive for tremors.  Psychiatric/Behavioral: Negative for suicidal ideas and self-injury.  All other systems reviewed and are negative.     Allergies  Morphine and related; Morphine and related; and Nsaids  Home Medications   Prior to Admission medications   Medication Sig Start Date End Date Taking? Authorizing Provider  escitalopram (LEXAPRO) 5 MG tablet Take 2 tablets (10 mg total) by mouth daily. 10/04/14  Kennedy Bucker, NP  folic acid (FOLVITE) 1 MG tablet Take 1 mg by mouth daily.    Historical Provider, MD  Multiple Vitamin (MULTIVITAMIN WITH MINERALS) TABS tablet Take 1 tablet by mouth daily. For low vitamin 10/04/14   Kennedy Bucker, NP  nadolol (CORGARD) 40 MG tablet Take 1 tablet (40 mg total) by mouth daily. 10/06/14   Lahoma Crocker, MD  pantoprazole (PROTONIX) 40 MG tablet Take 40 mg by mouth daily.    Historical Provider, MD  Tetrahydrozoline HCl (VISINE OP) Place 1 drop into both eyes 2 (two) times daily.     Historical Provider, MD   BP 111/70 mmHg   Pulse 97  Temp(Src) 98.4 F (36.9 C) (Oral)  Resp 22  SpO2 95%  LMP 08/05/2014 (Approximate) Physical Exam  Constitutional: She is oriented to person, place, and time. She appears well-developed and well-nourished.  HENT:  Head: Normocephalic and atraumatic.  Right Ear: External ear normal.  Left Ear: External ear normal.  Nose: Nose normal.  Eyes: Right eye exhibits no discharge. Left eye exhibits no discharge.  Cardiovascular: Normal rate, regular rhythm and normal heart sounds.   Pulmonary/Chest: Effort normal and breath sounds normal. She exhibits no tenderness.  Abdominal: Soft. There is no tenderness.  Neurological: She is alert and oriented to person, place, and time. She displays no tremor.  Skin: Skin is warm and dry.  Psychiatric: Her mood appears not anxious. She is not agitated. She expresses no homicidal and no suicidal ideation.  Nursing note and vitals reviewed.   ED Course  Procedures (including critical care time) Labs Review Labs Reviewed  CBC - Abnormal; Notable for the following:    RBC 3.68 (*)    Hemoglobin 10.0 (*)    HCT 32.9 (*)    RDW 20.5 (*)    Platelets 53 (*)    All other components within normal limits  COMPREHENSIVE METABOLIC PANEL - Abnormal; Notable for the following:    Glucose, Bld 111 (*)    Creatinine, Ser 0.39 (*)    Total Protein 8.5 (*)    AST 179 (*)    ALT 46 (*)    Total Bilirubin 1.3 (*)    All other components within normal limits  ETHANOL - Abnormal; Notable for the following:    Alcohol, Ethyl (B) 372 (*)    All other components within normal limits  SALICYLATE LEVEL - Abnormal; Notable for the following:    Salicylate Lvl <8.0 (*)    All other components within normal limits  ACETAMINOPHEN LEVEL  URINE RAPID DRUG SCREEN (HOSP PERFORMED)  TROPONIN I  TROPONIN I    Imaging Review Dg Chest 2 View  11/04/2014   CLINICAL DATA:  Acute chest pain.  EXAM: CHEST  2 VIEW  COMPARISON:  June 11, 2014.  FINDINGS: The heart  size and mediastinal contours are within normal limits. Both lungs are clear. No pneumothorax or pleural effusion is noted. The visualized skeletal structures are unremarkable.  IMPRESSION: No acute cardiopulmonary abnormality seen.   Electronically Signed   By: Sabino Dick M.D.   On: 11/04/2014 19:16     EKG Interpretation   Date/Time:  Tuesday November 04 2014 17:07:37 EST Ventricular Rate:  94 PR Interval:  178 QRS Duration: 88 QT Interval:  377 QTC Calculation: 471 R Axis:   34 Text Interpretation:  Sinus rhythm No significant change since last  tracing Confirmed by HARRISON  MD, FORREST ([id number]) on 11/04/2014 5:14:28 PM      MDM  Final diagnoses:  Alcohol dependence with alcohol-induced mood disorder    Patient with alcohol abuse wanting voluntary detox. No suicidal or homicidal thoughts. Patient doesn't worse atypical chest pain that only occurs in the morning and goes away with taking alcohol. I have low suspicion this is related to PE, ACS, or dissection. EKG is unremarkable. Will do a delta troponin and consult psychiatry.    Ephraim Hamburger, MD 11/04/14 [id number]
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
## 2014-11-04 NOTE — ED Notes (Signed)
Patient reports anxiety and irritability along with nausea. Request medication to help her relax and get sleep.  Q 15 safety checks continue.

## 2014-11-05 ENCOUNTER — Encounter (HOSPITAL_COMMUNITY): Payer: Self-pay | Admitting: *Deleted

## 2014-11-05 ENCOUNTER — Emergency Department (HOSPITAL_COMMUNITY): Admit: 2014-11-05 | Discharge: 2014-11-12 | Discharge status: Absent without leave | Payer: 59

## 2014-11-05 ENCOUNTER — Observation Stay (HOSPITAL_COMMUNITY)
Admission: AD | Admit: 2014-11-05 | Discharge: 2014-11-06 | Disposition: A | Discharge status: Other Acute Inpatient Hospital | Payer: 59 | Source: Intra-hospital | Attending: Nurse Practitioner | Admitting: Nurse Practitioner

## 2014-11-05 DIAGNOSIS — D61818 Other pancytopenia: Secondary | ICD-10-CM | POA: Diagnosis present

## 2014-11-05 DIAGNOSIS — F1020 Alcohol dependence, uncomplicated: Secondary | ICD-10-CM | POA: Diagnosis not present

## 2014-11-05 DIAGNOSIS — K920 Hematemesis: Principal | ICD-10-CM | POA: Insufficient documentation

## 2014-11-05 DIAGNOSIS — Z79899 Other long term (current) drug therapy: Secondary | ICD-10-CM | POA: Insufficient documentation

## 2014-11-05 DIAGNOSIS — D696 Thrombocytopenia, unspecified: Secondary | ICD-10-CM | POA: Diagnosis present

## 2014-11-05 DIAGNOSIS — K7030 Alcoholic cirrhosis of liver without ascites: Secondary | ICD-10-CM

## 2014-11-05 DIAGNOSIS — F4310 Post-traumatic stress disorder, unspecified: Secondary | ICD-10-CM | POA: Diagnosis present

## 2014-11-05 DIAGNOSIS — F1010 Alcohol abuse, uncomplicated: Secondary | ICD-10-CM

## 2014-11-05 DIAGNOSIS — K210 Gastro-esophageal reflux disease with esophagitis: Secondary | ICD-10-CM

## 2014-11-05 DIAGNOSIS — K766 Portal hypertension: Secondary | ICD-10-CM | POA: Diagnosis not present

## 2014-11-05 DIAGNOSIS — I8500 Esophageal varices without bleeding: Secondary | ICD-10-CM | POA: Insufficient documentation

## 2014-11-05 DIAGNOSIS — F419 Anxiety disorder, unspecified: Secondary | ICD-10-CM | POA: Diagnosis not present

## 2014-11-05 DIAGNOSIS — F329 Major depressive disorder, single episode, unspecified: Secondary | ICD-10-CM | POA: Diagnosis not present

## 2014-11-05 DIAGNOSIS — Z886 Allergy status to analgesic agent status: Secondary | ICD-10-CM | POA: Insufficient documentation

## 2014-11-05 DIAGNOSIS — F1994 Other psychoactive substance use, unspecified with psychoactive substance-induced mood disorder: Secondary | ICD-10-CM | POA: Diagnosis present

## 2014-11-05 DIAGNOSIS — F332 Major depressive disorder, recurrent severe without psychotic features: Secondary | ICD-10-CM

## 2014-11-05 DIAGNOSIS — F32A Depression, unspecified: Secondary | ICD-10-CM | POA: Diagnosis present

## 2014-11-05 DIAGNOSIS — F1024 Alcohol dependence with alcohol-induced mood disorder: Secondary | ICD-10-CM

## 2014-11-05 DIAGNOSIS — D649 Anemia, unspecified: Secondary | ICD-10-CM | POA: Diagnosis present

## 2014-11-05 DIAGNOSIS — Z885 Allergy status to narcotic agent status: Secondary | ICD-10-CM | POA: Insufficient documentation

## 2014-11-05 DIAGNOSIS — K922 Gastrointestinal hemorrhage, unspecified: Secondary | ICD-10-CM | POA: Diagnosis present

## 2014-11-05 DIAGNOSIS — K219 Gastro-esophageal reflux disease without esophagitis: Secondary | ICD-10-CM | POA: Insufficient documentation

## 2014-11-05 DIAGNOSIS — K7460 Unspecified cirrhosis of liver: Secondary | ICD-10-CM | POA: Insufficient documentation

## 2014-11-05 DIAGNOSIS — R110 Nausea: Secondary | ICD-10-CM

## 2014-11-05 DIAGNOSIS — F411 Generalized anxiety disorder: Secondary | ICD-10-CM | POA: Diagnosis present

## 2014-11-05 LAB — BASIC METABOLIC PANEL
ANION GAP: 12 (ref 5–15)
BUN: 6 mg/dL (ref 6–23)
CHLORIDE: 95 meq/L — AB (ref 96–112)
CO2: 25 meq/L (ref 19–32)
CREATININE: 0.45 mg/dL — AB (ref 0.50–1.10)
Calcium: 9.2 mg/dL (ref 8.4–10.5)
GFR calc non Af Amer: >90 mL/min (ref >90)
Glucose, Bld: 122 mg/dL — ABNORMAL HIGH (ref 70–99)
Potassium: 3.7 mEq/L (ref 3.7–5.3)
Sodium: 132 mEq/L — ABNORMAL LOW (ref 137–147)

## 2014-11-05 LAB — CBC WITH DIFFERENTIAL/PLATELET
BASOS ABS: 0 10*3/uL (ref 0.0–0.1)
BASOS PCT: 0 % (ref 0–1)
Eosinophils Absolute: 0 10*3/uL (ref 0.0–0.7)
Eosinophils Relative: 0 % (ref 0–5)
HEMATOCRIT: 29.1 % — AB (ref 36.0–46.0)
Hemoglobin: 8.7 g/dL — ABNORMAL LOW (ref 12.0–15.0)
LYMPHS PCT: 30 % (ref 12–46)
Lymphs Abs: 0.8 10*3/uL (ref 0.7–4.0)
MCH: 26.9 pg (ref 26.0–34.0)
MCHC: 29.9 g/dL — AB (ref 30.0–36.0)
MCV: 89.8 fL (ref 78.0–100.0)
MONO ABS: 0.3 10*3/uL (ref 0.1–1.0)
Monocytes Relative: 11 % (ref 3–12)
NEUTROS ABS: 1.6 10*3/uL — AB (ref 1.7–7.7)
Neutrophils Relative %: 58 % (ref 43–77)
PLATELETS: 33 10*3/uL — AB (ref 150–400)
RBC: 3.24 MIL/uL — ABNORMAL LOW (ref 3.87–5.11)
RDW: 20.4 % — ABNORMAL HIGH (ref 11.5–15.5)
WBC: 2.7 10*3/uL — ABNORMAL LOW (ref 4.0–10.5)

## 2014-11-05 LAB — POC OCCULT BLOOD, ED: FECAL OCCULT BLD: NEGATIVE

## 2014-11-05 MED ORDER — LORAZEPAM 1 MG PO TABS
ORAL_TABLET | ORAL | Status: AC
Start: 2014-11-05 — End: 2014-11-05
  Administered 2014-11-05: 1 mg
  Filled 2014-11-05: qty 1

## 2014-11-05 MED ORDER — LORAZEPAM 1 MG PO TABS
1.0000 mg | ORAL_TABLET | ORAL | Status: AC
  Administered 2014-11-05: 1 mg via ORAL

## 2014-11-05 MED ORDER — ONDANSETRON 4 MG PO TBDP
4.0000 mg | ORAL_TABLET | Freq: Once | ORAL | Status: AC
  Administered 2014-11-05: 4 mg via ORAL

## 2014-11-05 MED ORDER — LORAZEPAM 1 MG PO TABS: 1.0000 mg | ORAL_TABLET | Freq: Three times a day (TID) | ORAL | Status: DC

## 2014-11-05 MED ORDER — TRAZODONE HCL 100 MG PO TABS
100.0000 mg | ORAL_TABLET | Freq: Every evening | ORAL | Status: DC | PRN
  Administered 2014-11-05: 100 mg via ORAL
  Filled 2014-11-05 (×6): qty 1

## 2014-11-05 MED ORDER — ESCITALOPRAM OXALATE 10 MG PO TABS
10.0000 mg | ORAL_TABLET | Freq: Every day | ORAL | Status: DC
  Filled 2014-11-05 (×2): qty 1

## 2014-11-05 MED ORDER — LORAZEPAM 1 MG PO TABS: 1.0000 mg | ORAL_TABLET | Freq: Two times a day (BID) | ORAL | Status: DC

## 2014-11-05 MED ORDER — ONDANSETRON 4 MG PO TBDP
4.0000 mg | ORAL_TABLET | Freq: Four times a day (QID) | ORAL | Status: DC | PRN
  Filled 2014-11-05: qty 1

## 2014-11-05 MED ORDER — VITAMIN B-1 100 MG PO TABS
100.0000 mg | ORAL_TABLET | Freq: Every day | ORAL | Status: DC
  Filled 2014-11-05 (×2): qty 1

## 2014-11-05 MED ORDER — VITAMIN B-1 100 MG PO TABS: 100.0000 mg | ORAL_TABLET | Freq: Every day | ORAL | Status: DC

## 2014-11-05 MED ORDER — LORAZEPAM 1 MG PO TABS: 0.0000 mg | ORAL_TABLET | Freq: Four times a day (QID) | ORAL | Status: DC

## 2014-11-05 MED ORDER — BUSPIRONE HCL 10 MG PO TABS
10.0000 mg | ORAL_TABLET | Freq: Two times a day (BID) | ORAL | Status: DC
Start: 2014-11-05 — End: 2014-11-06
  Filled 2014-11-05 (×4): qty 1

## 2014-11-05 MED ORDER — LORAZEPAM 1 MG PO TABS: 0.0000 mg | ORAL_TABLET | Freq: Two times a day (BID) | ORAL | Status: DC

## 2014-11-05 MED ORDER — ALUM & MAG HYDROXIDE-SIMETH 200-200-20 MG/5ML PO SUSP: 30.0000 mL | ORAL | Status: DC | PRN

## 2014-11-05 MED ORDER — MAGNESIUM HYDROXIDE 400 MG/5ML PO SUSP: 30.0000 mL | Freq: Every day | ORAL | Status: DC | PRN

## 2014-11-05 MED ORDER — LORAZEPAM 1 MG PO TABS
1.0000 mg | ORAL_TABLET | Freq: Four times a day (QID) | ORAL | Status: DC
  Administered 2014-11-05: 1 mg via ORAL
  Filled 2014-11-05: qty 1

## 2014-11-05 MED ORDER — ESCITALOPRAM OXALATE 10 MG PO TABS
20.0000 mg | ORAL_TABLET | Freq: Every day | ORAL | Status: DC
  Filled 2014-11-05 (×2): qty 1

## 2014-11-05 MED ORDER — THIAMINE HCL 100 MG/ML IJ SOLN: 100.0000 mg | Freq: Every day | INTRAMUSCULAR | Status: DC

## 2014-11-05 MED ORDER — ADULT MULTIVITAMIN W/MINERALS CH
1.0000 | ORAL_TABLET | Freq: Every day | ORAL | Status: DC
  Filled 2014-11-05 (×3): qty 1

## 2014-11-05 MED ORDER — ONDANSETRON 4 MG PO TBDP: 4.0000 mg | ORAL_TABLET | Freq: Three times a day (TID) | ORAL | Status: DC | PRN

## 2014-11-05 MED ORDER — ESCITALOPRAM OXALATE 10 MG PO TABS
20.0000 mg | ORAL_TABLET | Freq: Every day | ORAL | Status: DC
  Administered 2014-11-05: 20 mg via ORAL
  Filled 2014-11-05: qty 2

## 2014-11-05 MED ORDER — LORAZEPAM 1 MG PO TABS: 1.0000 mg | ORAL_TABLET | Freq: Every day | ORAL | Status: DC

## 2014-11-05 MED ORDER — TRAZODONE HCL 100 MG PO TABS: 100.0000 mg | ORAL_TABLET | Freq: Every evening | ORAL | Status: DC | PRN

## 2014-11-05 NOTE — Progress Notes (Signed)
[place] INPATIENT:  Family/Significant Other Suicide Prevention Education  Suicide Prevention Education:  Patient Refusal for Family/Significant Other Suicide Prevention Education: The patient Sara [name] has refused to provide written consent for family/significant other to be provided Family/Significant Other Suicide Prevention Education during admission and/or prior to discharge.  Physician notified.  Davina Poke 11/05/2014, 2:50 PM

## 2014-11-05 NOTE — ED Notes (Signed)
Pt BIB EMS. Pt has a room at [facility]. Pt was just d/c'ed from ED today at 1300 and then sent to ED. Pt ate lunch and had one episode of vomiting. Staff noticed some bright red blood in vomitus and called EMS. Pt has no pain. Pt alert, no acute distress. Skin warm and dry.

## 2014-11-05 NOTE — H&P (Signed)
Psychiatric Admission Assessment Adult  Patient Identification:  Sara [name] Date of Evaluation:  11/05/2014 Chief Complaint:  ALCOHOL USE DISORDER  History of Present Illness:: Ms Sara [name] is a 38 year old [place] female who presented to the observation unit from [facility] for Alcohol dependency and desire for detoxification and substance abuse recovery.  Patient has a long history of alcohol use and reports that she has been hospitalized for substance use in the past.  Last inpatient stay was at behavioral health in October of 2014.  From there patient completed substance recovery treatment at [facility] before attending a long term residential program in [place].  Patient reports that she completed her recovery program in May and maintained her sobriety for 2 1/2 months.  Patient notes escalation of use over the past few months, with current daily consumption between 3 - 4 bottles of wine per day. Patient reports that this is causing a strain on her marriage and worries that it has also had repercussion on her health.  Patient was hospitalized for a GI bleed in August 2015.  Patient Endorses many symptoms of depression including increased sadness, anhedonia, low energy, insomnia, and feelings of guilt with regard to her drinking.  Patient denies current suicidal ideation, denies homicidal ideation, or auditory or visual hallucinations.  Patient does not attend to internal stimuli.  NO evidence of delusions.  Patient is noted to be tremulous as we talk with a fine motor tremor noted to hands bilaterally.  Patient c/o nausea and then vomited during the assessment.  Noted to have small quantity approximately 4 quarter size spots oblood in emesis.  Patient sent to [facility] to further evaluate medical needs.  Patient HCT is noted to be 32.0 Hemoglobin is 10.7  BAC at time of admission was 372.    Elements:  Location:  generalized . Quality:  acute. Severity:  severe. Timing:   constant. Duration:  acute exacerbation of a chronic probelm. Context:  relationship difficlties and health problems . Associated Signs/Synptoms: Depression Symptoms:  depressed mood, anhedonia, insomnia, fatigue, feelings of worthlessness/guilt, anxiety, (Hypo) Manic Symptoms:  Denies  Anxiety Symptoms:  Excessive Worry, Psychotic Symptoms: denies  PTSD Symptoms: NA Total Time spent with patient: 45 minutes  Psychiatric Specialty Exam: Physical Exam  Review of Systems  Constitutional: Positive for malaise/fatigue.  HENT: Negative.   Eyes: Negative.   Respiratory: Negative.   Cardiovascular: Negative.   Gastrointestinal: Positive for nausea and vomiting.  Genitourinary: Negative.   Musculoskeletal: Negative.   Skin: Negative.   Neurological: Positive for tremors.  Endo/Heme/Allergies: Negative.   Psychiatric/Behavioral: Positive for depression and substance abuse. Negative for suicidal ideas. The patient is nervous/anxious and has insomnia.     Blood pressure 134/67, pulse 87, temperature 98.5 F (36.9 C), temperature source Oral, resp. rate 16, height _0  (1.6 m), weight 77.111 kg (170 lb), last menstrual period 08/05/2014.Body mass index is 30.12 kg/(m^2).  General Appearance: Casual  Eye Contact::  Good  Speech:  Clear and Coherent  Volume:  Normal  Mood:  Anxious and Depressed  Affect:  Congruent  Thought Process:  Coherent and Goal Directed  Orientation:  Full (Time, Place, and Person)  Thought Content:  Rumination  Suicidal Thoughts:  No  Homicidal Thoughts:  No  Memory:  Immediate;   Good Recent;   Good Remote;   Good  Judgement:  Impaired makes independent detrimental decisions   Insight:  Fair  Psychomotor Activity:  Normal  Concentration:  Fair  Recall:  Roel Cluck of Knowledge:Good  Language: Good  Akathisia:  No  Handed:  Right  AIMS (if indicated):     Assets:  Communication Skills Desire for Improvement Housing Social Support  Sleep:        Musculoskeletal: Strength & Muscle Tone: within normal limits Gait & Station: normal Patient leans: N/A  Past Psychiatric History: Diagnosis:  Depression   Hospitalizations: [facility] 2014   Outpatient Care:  Substance Abuse Care: [facility]   Self-Mutilation:  Suicidal Attempts: denies   Violent Behaviors: denies    Past Medical History:   Past Medical History  Diagnosis Date  . Depression   . Pneumonia   . Cirrhosis   . Portal hypertension   . S/P alcohol detoxification     2-3 days at behavioral health previously  . Alcoholism   . Anxiety   . GERD (gastroesophageal reflux disease)   . 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 Menorrhagia   . Pancytopenia 01/15/2014  . UGI bleed 06/12/2014   None. Allergies:   Allergies  Allergen Reactions  . Morphine And Related Other (See Comments)    Slowed HR, lowered BP  . Morphine And Related Other (See Comments)    hypotension  . Nsaids Other (See Comments)    bleeding   PTA Medications: Prescriptions prior to admission  Medication Sig Dispense Refill Last Dose  . escitalopram (LEXAPRO) 5 MG tablet Take 2 tablets (10 mg total) by mouth daily. (Patient not taking: Reported on 11/04/2014) 10 tablet 0 Not Taking at Unknown time  . folic acid (FOLVITE) 1 MG tablet Take 1 mg by mouth daily.   11/04/2014 at Unknown time  . Multiple Vitamin (MULTIVITAMIN WITH MINERALS) TABS tablet Take 1 tablet by mouth daily. For low vitamin   11/04/2014 at Unknown time  . nadolol (CORGARD) 40 MG tablet Take 1 tablet (40 mg total) by mouth daily. (Patient not taking: Reported on 11/04/2014) 30 tablet 0 Not Taking at Unknown time  . pantoprazole (PROTONIX) 40 MG tablet Take 40 mg by mouth daily.   11/04/2014 at Unknown time  . Tetrahydrozoline HCl (VISINE OP) Place 1 drop into both eyes 2 (two) times daily as needed (dry eyes).    11/03/2014 at Unknown time    Previous Psychotropic  Medications:  Medication/Dose  Zoloft, Lexapro, Prozac                Substance Abuse History in the last 12 months:  Yes.    Consequences of Substance Abuse: Medical Consequences:  history of cirrhosis, GI Bleed Family Consequences:  having relationship difficulties with husband  Withdrawal Symptoms:   Nausea Tremors Vomiting  Social History:  reports that she has never smoked. She has never used smokeless tobacco. She reports that she drinks about 42.0 oz of alcohol per week. She reports that she uses illicit drugs (Cocaine and Marijuana). Additional Social History: Pain Medications: Pt denies abuse.  Prescriptions: Pt denies abuse. Over the Counter: Pt denies abuse.  History of alcohol / drug use?: Yes Negative Consequences of Use: Personal relationships Withdrawal Symptoms: Anorexia, Tremors Name of Substance 1: Alcohol  1 - Age of First Use: 27 1 - Amount (size/oz): "2-3 bottles of wine" 1 - Frequency: daily  1 - Duration: ongoing  1 - Last Use / Amount: 11/04/14-2 bottles of wine                  Current Place of Residence:   Place of  Birth:   Family Members: Marital Status:  Married Patient reports she is currently unemployed as she stopped working to attend rehabilitation last august.    Family History:   Family History  Problem Relation Age of Onset  . Colon polyps Mother   . Hypertension Mother   . Thyroid disease Mother   . Alcoholism Mother   . Alcoholism Father   . Alcohol abuse Maternal Grandfather   . Alcohol abuse Paternal Grandfather   . Alcohol abuse Paternal Aunt   . Alcohol abuse Maternal Uncle     Results for orders placed or performed during the hospital encounter of 11/04/14 (from the past 72 hour(s))  Urine Drug Screen     Status: None   Collection Time: 11/04/14  5:27 PM  Result Value Ref Range   Opiates NONE DETECTED NONE DETECTED   Cocaine NONE DETECTED NONE DETECTED   Benzodiazepines NONE DETECTED NONE DETECTED    Amphetamines NONE DETECTED NONE DETECTED   Tetrahydrocannabinol NONE DETECTED NONE DETECTED   Barbiturates NONE DETECTED NONE DETECTED    Comment:        DRUG SCREEN FOR MEDICAL PURPOSES ONLY.  IF CONFIRMATION IS NEEDED FOR ANY PURPOSE, NOTIFY LAB WITHIN 5 DAYS.        LOWEST DETECTABLE LIMITS FOR URINE DRUG SCREEN Drug Class       Cutoff (ng/mL) Amphetamine      1000 Barbiturate      200 Benzodiazepine   794 Tricyclics       327 Opiates          300 Cocaine          300 THC              50   Acetaminophen level     Status: None   Collection Time: 11/04/14  5:48 PM  Result Value Ref Range   Acetaminophen (Tylenol), Serum <15.0 10 - 30 ug/mL    Comment:        THERAPEUTIC CONCENTRATIONS VARY SIGNIFICANTLY. A RANGE OF 10-30 ug/mL MAY BE AN EFFECTIVE CONCENTRATION FOR MANY PATIENTS. HOWEVER, SOME ARE BEST TREATED AT CONCENTRATIONS OUTSIDE THIS RANGE. ACETAMINOPHEN CONCENTRATIONS >150 ug/mL AT 4 HOURS AFTER INGESTION AND >50 ug/mL AT 12 HOURS AFTER INGESTION ARE OFTEN ASSOCIATED WITH TOXIC REACTIONS.   CBC     Status: Abnormal   Collection Time: 11/04/14  5:48 PM  Result Value Ref Range   WBC 4.9 4.0 - 10.5 K/uL   RBC 3.68 (L) 3.87 - 5.11 MIL/uL   Hemoglobin 10.0 (L) 12.0 - 15.0 g/dL   HCT 32.9 (L) 36.0 - 46.0 %   MCV 89.4 78.0 - 100.0 fL   MCH 27.2 26.0 - 34.0 pg   MCHC 30.4 30.0 - 36.0 g/dL   RDW 20.5 (H) 11.5 - 15.5 %   Platelets 53 (L) 150 - 400 K/uL    Comment: REPEATED TO VERIFY SPECIMEN CHECKED FOR CLOTS PLATELET COUNT CONFIRMED BY SMEAR   Comprehensive metabolic panel     Status: Abnormal   Collection Time: 11/04/14  5:48 PM  Result Value Ref Range   Sodium 140 137 - 147 mEq/L   Potassium 4.2 3.7 - 5.3 mEq/L   Chloride 102 96 - 112 mEq/L   CO2 23 19 - 32 mEq/L   Glucose, Bld 111 (H) 70 - 99 mg/dL   BUN 6 6 - 23 mg/dL   Creatinine, Ser 0.39 (L) 0.50 - 1.10 mg/dL   Calcium 8.8 8.4 - 10.5 mg/dL  Total Protein 8.5 (H) 6.0 - 8.3 g/dL   Albumin 3.5  3.5 - 5.2 g/dL   AST 179 (H) 0 - 37 U/L   ALT 46 (H) 0 - 35 U/L   Alkaline Phosphatase 115 39 - 117 U/L   Total Bilirubin 1.3 (H) 0.3 - 1.2 mg/dL   GFR calc non Af Amer >90 >90 mL/min   GFR calc Af Amer >90 >90 mL/min    Comment: (NOTE) The eGFR has been calculated using the CKD EPI equation. This calculation has not been validated in all clinical situations. eGFR's persistently <90 mL/min signify possible Chronic Kidney Disease.    Anion gap 15 5 - 15  Ethanol (ETOH)     Status: Abnormal   Collection Time: 11/04/14  5:48 PM  Result Value Ref Range   Alcohol, Ethyl (B) 372 (H) 0 - 11 mg/dL    Comment:        LOWEST DETECTABLE LIMIT FOR SERUM ALCOHOL IS 11 mg/dL FOR MEDICAL PURPOSES ONLY   Salicylate level     Status: Abnormal   Collection Time: 11/04/14  5:48 PM  Result Value Ref Range   Salicylate Lvl <0.9 (L) 2.8 - 20.0 mg/dL  Troponin I     Status: None   Collection Time: 11/04/14  6:14 PM  Result Value Ref Range   Troponin I <0.30 <0.30 ng/mL    Comment:        Due to the release kinetics of cTnI, a negative result within the first hours of the onset of symptoms does not rule out myocardial infarction with certainty. If myocardial infarction is still suspected, repeat the test at appropriate intervals.    Psychological Evaluations:  Assessment:   DSM5: Schizophrenia:  none Obsessive-Compulsive Disorders:  None  Trauma-Stressor Disorders:  None  Substance/Addictive Disorders:  Alcohol Related Disorder - Severe (303.90) Depressive Disorders:  Major Depressive Disorder - Moderate (296.22)  Versus Substance induced mood disorder   AXIS I:  Major Depression, Recurrent severe AXIS II:  Deferred AXIS III:   Past Medical History  Diagnosis Date  . Depression   . Pneumonia   . Cirrhosis   . Portal hypertension   . S/P alcohol detoxification     2-3 days at behavioral health previously  . Alcoholism   . Anxiety   . GERD (gastroesophageal reflux disease)   .  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 Menorrhagia   . Pancytopenia 01/15/2014  . UGI bleed 06/12/2014   AXIS IV:  other psychosocial or environmental problems and problems with primary support group AXIS V:  41-50 serious symptoms  Treatment Plan/Recommendations:  Given patient's change in medical status will have patient return to the emergency department to stabilize.  Will continue with ativan detox protocol.  ER triage called and apprised of patient transfer    [facility] ativan detox protocol.  Current Medications:  Current Facility-Administered Medications  Medication Dose Route Frequency Provider Last Rate Last Dose  . alum & mag hydroxide-simeth (MAALOX/MYLANTA) 200-200-20 MG/5ML suspension 30 mL  30 mL Oral Q4H PRN Delfin Gant, NP      . busPIRone (BUSPAR) tablet 10 mg  10 mg Oral BID Delfin Gant, NP      . escitalopram (LEXAPRO) tablet 10 mg  10 mg Oral Daily Delfin Gant, NP   10 mg at 11/05/14 1518  . [START ON 11/06/2014] escitalopram (LEXAPRO) tablet 20 mg  20 mg Oral Daily Delfin Gant, NP      . LORazepam (  ATIVAN) tablet 1 mg  1 mg Oral QID Delfin Gant, NP       Followed by  . [START ON 11/06/2014] LORazepam (ATIVAN) tablet 1 mg  1 mg Oral TID Delfin Gant, NP       Followed by  . [START ON 11/07/2014] LORazepam (ATIVAN) tablet 1 mg  1 mg Oral BID Delfin Gant, NP       Followed by  . [START ON 11/08/2014] LORazepam (ATIVAN) tablet 1 mg  1 mg Oral Daily Delfin Gant, NP      . LORazepam (ATIVAN) tablet 1 mg  1 mg Oral NOW Kennedy Bucker, NP      . magnesium hydroxide (MILK OF MAGNESIA) suspension 30 mL  30 mL Oral Daily PRN Delfin Gant, NP      . multivitamin with minerals tablet 1 tablet  1 tablet Oral Daily Delfin Gant, NP      . ondansetron (ZOFRAN-ODT) disintegrating tablet 4 mg  4 mg Oral Q6H PRN Delfin Gant, NP      . Derrill Memo ON 11/06/2014] thiamine (VITAMIN B-1) tablet 100 mg  100 mg  Oral Daily Delfin Gant, NP      . traZODone (DESYREL) tablet 100 mg  100 mg Oral QHS,MR X 1 Delfin Gant, NP         Kennedy Bucker PMH-NP  12/16/20153:32 PM   Case discussed with me as above

## 2014-11-05 NOTE — ED Notes (Signed)
Reports anxiety at 7/10 with nausea.  Q 15 safety checks continue.

## 2014-11-05 NOTE — ED Provider Notes (Signed)
CSN: [id number]     Arrival date & time 11/05/14  1633 History   First MD Initiated Contact with Patient 11/05/14 1639     Chief Complaint  Patient presents with  . Hematemesis     (Consider location/radiation/quality/duration/timing/severity/associated sxs/prior Treatment) Patient is a 38 y.o. female presenting with vomiting.  Emesis Severity:  Mild Duration: one time about two hours ago. Timing: once. Quality:  Stomach contents (a few small red spots totalling about to area of her hand) Progression:  Resolved Chronicity: pt has a history of varices.  Most recent EGD a few months ago showed normal esophagus without visible varices. Context comment:  Pt was evaluated in the ED yesterday for alcohol abuse and admitted to [facility] for detox today.  She ate lunch.  Later, she began to get nauseated, causing her to vomit once.  Relieved by:  Nothing Worsened by:  Nothing tried Associated symptoms: no diarrhea and no fever   Associated symptoms comment:  Nausea   Past Medical History  Diagnosis Date  . Depression   . Pneumonia   . Cirrhosis   . Portal hypertension   . S/P alcohol detoxification     2-3 days at behavioral health previously  . Alcoholism   . Anxiety   . GERD (gastroesophageal reflux disease)   . 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 Menorrhagia   . Pancytopenia 01/15/2014  . UGI bleed 06/12/2014   Past Surgical History  Procedure Laterality Date  . Cholecystectomy    . Esophagogastroduodenoscopy N/A 06/12/2014    Procedure: ESOPHAGOGASTRODUODENOSCOPY (EGD);  Surgeon: Gatha Mayer, MD;  Location: [facility];  Service: Endoscopy;  Laterality: N/A;  . Esophagogastroduodenoscopy (egd) with propofol N/A 07/29/2014    Procedure: ESOPHAGOGASTRODUODENOSCOPY (EGD) WITH PROPOFOL;  Surgeon: Inda Castle, MD;  Location: [facility];  Service: Endoscopy;  Laterality: N/A;   Family History  Problem Relation Age of Onset  . Colon polyps Mother   .  Hypertension Mother   . Thyroid disease Mother   . Alcoholism Mother   . Alcoholism Father   . Alcohol abuse Maternal Grandfather   . Alcohol abuse Paternal Grandfather   . Alcohol abuse Paternal Aunt   . Alcohol abuse Maternal Uncle    History  Substance Use Topics  . Smoking status: Never Smoker   . Smokeless tobacco: Never Used  . Alcohol Use: 42.0 oz/week    70 Glasses of wine per week     Comment: reports 3 bottle of wine and pint of or more of vodka daily. 2 bottles of wine currently and pint of vodka some days for at least 4-5 days.a month.     OB History    Gravida Para Term Preterm AB TAB SAB Ectopic Multiple Living            2     Review of Systems  Gastrointestinal: Positive for vomiting. Negative for diarrhea.  All other systems reviewed and are negative.     Allergies  Morphine and related; Morphine and related; and Nsaids  Home Medications   Prior to Admission medications   Medication Sig Start Date End Date Taking? Authorizing Provider  escitalopram (LEXAPRO) 5 MG tablet Take 2 tablets (10 mg total) by mouth daily. Patient not taking: Reported on 11/04/2014 10/04/14   Kennedy Bucker, NP  folic acid (FOLVITE) 1 MG tablet Take 1 mg by mouth daily.    Historical Provider, MD  Multiple Vitamin (MULTIVITAMIN WITH MINERALS) TABS tablet Take 1 tablet by mouth daily.  For low vitamin 10/04/14   Kennedy Bucker, NP  nadolol (CORGARD) 40 MG tablet Take 1 tablet (40 mg total) by mouth daily. Patient not taking: Reported on 11/04/2014 10/06/14   Lahoma Crocker, MD  pantoprazole (PROTONIX) 40 MG tablet Take 40 mg by mouth daily.    Historical Provider, MD  Tetrahydrozoline HCl (VISINE OP) Place 1 drop into both eyes 2 (two) times daily as needed (dry eyes).     Historical Provider, MD   BP 120/63 mmHg  Pulse 81  Temp(Src) 98.7 F (37.1 C) (Oral)  Resp 16  Ht 5\' 3"  (1.6 m)  Wt 170 lb (77.111 kg)  BMI 30.12 kg/m2  SpO2 98%  LMP 08/05/2014  (Approximate) Physical Exam  Constitutional: She is oriented to person, place, and time. She appears well-developed and well-nourished. No distress.  HENT:  Head: Normocephalic and atraumatic.  Mouth/Throat: Oropharynx is clear and moist.  Eyes: Conjunctivae are normal. Pupils are equal, round, and reactive to light. No scleral icterus.  Neck: Neck supple.  Cardiovascular: Normal rate, regular rhythm, normal heart sounds and intact distal pulses.   No murmur heard. Pulmonary/Chest: Effort normal and breath sounds normal. No stridor. No respiratory distress. She has no rales.  Abdominal: Soft. Bowel sounds are normal. She exhibits no distension. There is no tenderness.  Genitourinary: Rectal exam shows no external hemorrhoid, no mass, no tenderness and anal tone normal. Guaiac stool: brown stool.  Musculoskeletal: Normal range of motion.  Neurological: She is alert and oriented to person, place, and time.  Skin: Skin is warm and dry. No rash noted.  Psychiatric: She has a normal mood and affect. Her behavior is normal.  Nursing note and vitals reviewed.   ED Course  Procedures (including critical care time) Labs Review Labs Reviewed  CBC WITH DIFFERENTIAL - Abnormal; Notable for the following:    WBC 2.7 (*)    RBC 3.24 (*)    Hemoglobin 8.7 (*)    HCT 29.1 (*)    MCHC 29.9 (*)    RDW 20.4 (*)    Platelets 33 (*)    Neutro Abs 1.6 (*)    All other components within normal limits  BASIC METABOLIC PANEL - Abnormal; Notable for the following:    Sodium 132 (*)    Chloride 95 (*)    Glucose, Bld 122 (*)    Creatinine, Ser 0.45 (*)    All other components within normal limits  CULTURE, BLOOD (ROUTINE X 2)  CULTURE, BLOOD (ROUTINE X 2)  CBC  CBC  CBC  CBC  PROTIME-INR  APTT  COMPREHENSIVE METABOLIC PANEL  PROTIME-INR  LACTIC ACID, PLASMA  POC OCCULT BLOOD, ED  TYPE AND SCREEN    Imaging Review Dg Chest 2 View  11/04/2014   CLINICAL DATA:  Acute chest pain.  EXAM:  CHEST  2 VIEW  COMPARISON:  June 11, 2014.  FINDINGS: The heart size and mediastinal contours are within normal limits. Both lungs are clear. No pneumothorax or pleural effusion is noted. The visualized skeletal structures are unremarkable.  IMPRESSION: No acute cardiopulmonary abnormality seen.   Electronically Signed   By: Sabino Dick M.D.   On: 11/04/2014 19:16  .   EKG Interpretation None      MDM   Final diagnoses:  Alcohol dependence with alcohol-induced mood disorder  Hematemesis with nausea    38 yo female presenting from [facility] where she was in detox from alcohol for an episode of hematemesis.  She has a history of  esophageal varices, one of which was banded earlier this year.  Hg dropped 1.3 points in 24 hours.  She also has low platelets.  Discussed with GI and Internal Medicine for admission to medical unit.      Artis Delay, MD 11/06/14 [phone]

## 2014-11-05 NOTE — BH Assessment (Signed)
[place] Assessment Progress Note  Pt accepted to [facility] Observation Unit by Corena Pilgrim, MD.  Per [name], RN, [facility], pt is assigned to Bed 6.  Pt has signed Voluntary Admission and Consent for Treatment, as well as Consent to Release Information to her husband.  Signed forms have been faxed to [facility].  Pt's nurse has been notified.  She agrees to send original paperwork with the pt via [employer], and to call report to [phone] or [phone].  Jalene Mullet, MA Triage Specialist 11/05/2014 @ 15:43

## 2014-11-05 NOTE — Progress Notes (Signed)
Patient ID: Sara [name], female   DOB: 14-Nov-1976, 38 y.o.   MRN: [id number] [facility] Eric K. Notified Probation officer that client would be going across the street to [facility] for evaluation of a GI bleed. 911 called for non emergency transport. She called her husband to let him know of the events. Shelly NP called the ED to notify them of her impending arrival.EMS transported to [facility]. Property left here in locker #13 until know if she is going to be staying across the street.

## 2014-11-05 NOTE — ED Notes (Signed)
Bed: WA06 Expected date:  Expected time:  Means of arrival:  Comments: EMS-vomiting blood
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
## 2014-11-05 NOTE — Progress Notes (Signed)
Patient ID: Sara [name], female   DOB: October 25, 1976, 38 y.o.   MRN: [id number] Being evaluated by Darrick Penna NP when she ran to bathroom to vomit. Showed writer contents in toilet and there were red tinged mucous among the food contents.She has a history of a GI bleed in Aug 2015 and is scared she has another one. Consulted with the NP and she ordered a hosptialist to come and evaluate this concern. Client given Ativan 1mg  now and ginger ale to sip on.

## 2014-11-05 NOTE — Progress Notes (Signed)
Patient ID: Sara [name], female   DOB: [birth year], 38 y.o.   MRN: [id number] Admission Note-Sent over to observation from [facility] to continue detox. She is interested in rehab after the holidays. She is pleasant and cooperative with the admission process. She is soft spoken and minimal verbally.She denies any other drug use. She denies being suicidal or homicidal. She has been drinking since her early 20's and is aware she has liver problems related to it.She has gastritis but generally in good health.She lives with her husband and her 67 yo son. She is not employed. She acknowledges she has been a bit depressed and is currently anxious. She reports a history of poor sleep and a decreased appetite which has been present for months probably related to drinking alcohol. Her alcohol level on admission to the ED was 372. UDS negative. Her only complaint of withdrawal sx now is tremors and some nausea. She had Zofran in the ED just before transferring her to OBS. Property locked up. Used phone to notify family of her where abouts. Offered and given water to drink once settled in.

## 2014-11-06 ENCOUNTER — Other Ambulatory Visit: Payer: Self-pay

## 2014-11-06 ENCOUNTER — Inpatient Hospital Stay (HOSPITAL_COMMUNITY)
Admission: EM | Admit: 2014-11-06 | Discharge: 2014-11-06 | DRG: 378 | Discharge status: Against Medical Advice | Payer: Commercial Managed Care - PPO | Source: Ambulatory Visit | Attending: Internal Medicine | Admitting: Internal Medicine

## 2014-11-06 ENCOUNTER — Encounter (HOSPITAL_COMMUNITY): Payer: Self-pay | Admitting: *Deleted

## 2014-11-06 DIAGNOSIS — D6959 Other secondary thrombocytopenia: Secondary | ICD-10-CM | POA: Diagnosis present

## 2014-11-06 DIAGNOSIS — D61818 Other pancytopenia: Secondary | ICD-10-CM | POA: Diagnosis present

## 2014-11-06 DIAGNOSIS — I8500 Esophageal varices without bleeding: Secondary | ICD-10-CM | POA: Diagnosis present

## 2014-11-06 DIAGNOSIS — F1020 Alcohol dependence, uncomplicated: Secondary | ICD-10-CM | POA: Diagnosis present

## 2014-11-06 DIAGNOSIS — F4310 Post-traumatic stress disorder, unspecified: Secondary | ICD-10-CM | POA: Diagnosis present

## 2014-11-06 DIAGNOSIS — F32A Depression, unspecified: Secondary | ICD-10-CM | POA: Diagnosis present

## 2014-11-06 DIAGNOSIS — K766 Portal hypertension: Secondary | ICD-10-CM | POA: Diagnosis present

## 2014-11-06 DIAGNOSIS — K7030 Alcoholic cirrhosis of liver without ascites: Secondary | ICD-10-CM | POA: Diagnosis present

## 2014-11-06 DIAGNOSIS — F419 Anxiety disorder, unspecified: Secondary | ICD-10-CM | POA: Diagnosis present

## 2014-11-06 DIAGNOSIS — Z79899 Other long term (current) drug therapy: Secondary | ICD-10-CM | POA: Diagnosis not present

## 2014-11-06 DIAGNOSIS — K219 Gastro-esophageal reflux disease without esophagitis: Secondary | ICD-10-CM | POA: Diagnosis present

## 2014-11-06 DIAGNOSIS — K2920 Alcoholic gastritis without bleeding: Secondary | ICD-10-CM | POA: Diagnosis present

## 2014-11-06 DIAGNOSIS — F411 Generalized anxiety disorder: Secondary | ICD-10-CM | POA: Diagnosis present

## 2014-11-06 DIAGNOSIS — I959 Hypotension, unspecified: Secondary | ICD-10-CM | POA: Diagnosis present

## 2014-11-06 DIAGNOSIS — F329 Major depressive disorder, single episode, unspecified: Secondary | ICD-10-CM | POA: Diagnosis present

## 2014-11-06 DIAGNOSIS — K920 Hematemesis: Secondary | ICD-10-CM | POA: Diagnosis present

## 2014-11-06 LAB — CBC
HCT: 27.9 % — ABNORMAL LOW (ref 36.0–46.0)
HCT: 28 % — ABNORMAL LOW (ref 36.0–46.0)
Hemoglobin: 8.5 g/dL — ABNORMAL LOW (ref 12.0–15.0)
Hemoglobin: 8.6 g/dL — ABNORMAL LOW (ref 12.0–15.0)
MCH: 27.4 pg (ref 26.0–34.0)
MCH: 27.7 pg (ref 26.0–34.0)
MCHC: 30.5 g/dL (ref 30.0–36.0)
MCHC: 30.7 g/dL (ref 30.0–36.0)
MCV: 90 fL (ref 78.0–100.0)
MCV: 90 fL (ref 78.0–100.0)
Platelets: 32 10*3/uL — ABNORMAL LOW (ref 150–400)
Platelets: 28 10*3/uL — CL (ref 150–400)
RBC: 3.1 MIL/uL — ABNORMAL LOW (ref 3.87–5.11)
RBC: 3.11 MIL/uL — ABNORMAL LOW (ref 3.87–5.11)
RDW: 20.4 % — AB (ref 11.5–15.5)
RDW: 20.4 % — ABNORMAL HIGH (ref 11.5–15.5)
WBC: 2.7 10*3/uL — ABNORMAL LOW (ref 4.0–10.5)
WBC: 2.3 10*3/uL — ABNORMAL LOW (ref 4.0–10.5)

## 2014-11-06 LAB — COMPREHENSIVE METABOLIC PANEL
ALBUMIN: 3 g/dL — AB (ref 3.5–5.2)
ALK PHOS: 93 U/L (ref 39–117)
ALT: 34 U/L (ref 0–35)
ANION GAP: 13 (ref 5–15)
AST: 124 U/L — ABNORMAL HIGH (ref 0–37)
BILIRUBIN TOTAL: 2.2 mg/dL — AB (ref 0.3–1.2)
BUN: 6 mg/dL (ref 6–23)
CHLORIDE: 98 meq/L (ref 96–112)
CO2: 25 meq/L (ref 19–32)
Calcium: 9.2 mg/dL (ref 8.4–10.5)
Creatinine, Ser: 0.49 mg/dL — ABNORMAL LOW (ref 0.50–1.10)
GFR calc Af Amer: >90 mL/min (ref >90)
GFR calc non Af Amer: >90 mL/min (ref >90)
GLUCOSE: 98 mg/dL (ref 70–99)
POTASSIUM: 3.5 meq/L — AB (ref 3.7–5.3)
Sodium: 136 mEq/L — ABNORMAL LOW (ref 137–147)
Total Protein: 7.5 g/dL (ref 6.0–8.3)

## 2014-11-06 LAB — GLUCOSE, CAPILLARY: Glucose-Capillary: 102 mg/dL — ABNORMAL HIGH (ref 70–99)

## 2014-11-06 LAB — PROTIME-INR
INR: 1.3 (ref 0.00–1.49)
PROTHROMBIN TIME: 16.4 s — AB (ref 11.6–15.2)

## 2014-11-06 LAB — APTT: aPTT: 40 seconds — ABNORMAL HIGH (ref 24–37)

## 2014-11-06 LAB — TROPONIN I: Troponin I: <0.3 ng/mL (ref <0.30)

## 2014-11-06 LAB — TYPE AND SCREEN
ABO/RH(D): A POS
Antibody Screen: NEGATIVE

## 2014-11-06 MED ORDER — TETRAHYDROZOLINE HCL 0.05 % OP SOLN: 1.0000 [drp] | Freq: Two times a day (BID) | OPHTHALMIC | Status: DC | PRN

## 2014-11-06 MED ORDER — ADULT MULTIVITAMIN W/MINERALS CH: 1.0000 | ORAL_TABLET | Freq: Every day | ORAL | Status: DC

## 2014-11-06 MED ORDER — LORAZEPAM 2 MG/ML IJ SOLN: 0.0000 mg | Freq: Four times a day (QID) | INTRAMUSCULAR | Status: DC

## 2014-11-06 MED ORDER — MAGNESIUM HYDROXIDE 400 MG/5ML PO SUSP: 30.0000 mL | Freq: Every day | ORAL | Status: DC | PRN

## 2014-11-06 MED ORDER — FOLIC ACID 1 MG PO TABS
1.0000 mg | ORAL_TABLET | Freq: Every day | ORAL | Status: DC
  Administered 2014-11-06: 1 mg via ORAL
  Filled 2014-11-06: qty 1

## 2014-11-06 MED ORDER — NADOLOL 40 MG PO TABS: 40.0000 mg | ORAL_TABLET | Freq: Every day | ORAL | Status: DC

## 2014-11-06 MED ORDER — TRAZODONE HCL 50 MG PO TABS
50.0000 mg | ORAL_TABLET | Freq: Every day | ORAL | Status: DC
  Filled 2014-11-06: qty 1

## 2014-11-06 MED ORDER — CEFTRIAXONE SODIUM IN DEXTROSE 20 MG/ML IV SOLN
1.0000 g | INTRAVENOUS | Status: DC
  Administered 2014-11-06: 1 g via INTRAVENOUS
  Filled 2014-11-06 (×2): qty 50

## 2014-11-06 MED ORDER — THIAMINE HCL 100 MG/ML IJ SOLN: 100.0000 mg | Freq: Every day | INTRAMUSCULAR | Status: DC

## 2014-11-06 MED ORDER — LORAZEPAM 2 MG/ML IJ SOLN: 0.0000 mg | Freq: Two times a day (BID) | INTRAMUSCULAR | Status: DC

## 2014-11-06 MED ORDER — VITAMIN B-1 100 MG PO TABS
100.0000 mg | ORAL_TABLET | Freq: Every day | ORAL | Status: DC
  Administered 2014-11-06: 100 mg via ORAL
  Filled 2014-11-06: qty 1

## 2014-11-06 MED ORDER — SODIUM CHLORIDE 0.9 % IJ SOLN
3.0000 mL | Freq: Two times a day (BID) | INTRAMUSCULAR | Status: DC
  Administered 2014-11-06: 3 mL via INTRAVENOUS

## 2014-11-06 MED ORDER — LORAZEPAM 1 MG PO TABS
1.0000 mg | ORAL_TABLET | Freq: Four times a day (QID) | ORAL | Status: DC
Start: 2014-11-06 — End: 2014-11-06

## 2014-11-06 MED ORDER — DEXTROSE 5 % IV SOLN: 1.0000 g | INTRAVENOUS | Status: DC

## 2014-11-06 MED ORDER — FOLIC ACID 1 MG PO TABS: 1.0000 mg | ORAL_TABLET | Freq: Every day | ORAL | Status: DC

## 2014-11-06 MED ORDER — BUSPIRONE HCL 15 MG PO TABS
7.5000 mg | ORAL_TABLET | Freq: Two times a day (BID) | ORAL | Status: DC
  Administered 2014-11-06 (×2): 7.5 mg via ORAL
  Filled 2014-11-06 (×3): qty 1

## 2014-11-06 MED ORDER — LORAZEPAM 2 MG/ML IJ SOLN
0.0000 mg | Freq: Two times a day (BID) | INTRAMUSCULAR | Status: DC
Start: 2014-11-08 — End: 2014-11-06

## 2014-11-06 MED ORDER — SODIUM CHLORIDE 0.9 % IJ SOLN: 3.0000 mL | Freq: Two times a day (BID) | INTRAMUSCULAR | Status: DC

## 2014-11-06 MED ORDER — LORAZEPAM 1 MG PO TABS: 1.0000 mg | ORAL_TABLET | Freq: Every day | ORAL | Status: DC

## 2014-11-06 MED ORDER — LORAZEPAM 2 MG/ML IJ SOLN
0.0000 mg | Freq: Four times a day (QID) | INTRAMUSCULAR | Status: DC
  Administered 2014-11-06 (×2): 2 mg via INTRAVENOUS
  Filled 2014-11-06 (×2): qty 1

## 2014-11-06 MED ORDER — ESCITALOPRAM OXALATE 10 MG PO TABS
10.0000 mg | ORAL_TABLET | Freq: Every day | ORAL | Status: DC
  Administered 2014-11-06: 10 mg via ORAL
  Filled 2014-11-06: qty 1

## 2014-11-06 MED ORDER — TRAZODONE HCL 100 MG PO TABS: 100.0000 mg | ORAL_TABLET | Freq: Every evening | ORAL | Status: DC | PRN

## 2014-11-06 MED ORDER — ESCITALOPRAM OXALATE 10 MG PO TABS: 20.0000 mg | ORAL_TABLET | Freq: Every day | ORAL | Status: DC

## 2014-11-06 MED ORDER — ONDANSETRON HCL 4 MG/2ML IJ SOLN
4.0000 mg | Freq: Three times a day (TID) | INTRAMUSCULAR | Status: DC | PRN
  Administered 2014-11-06: 4 mg via INTRAVENOUS
  Filled 2014-11-06: qty 2

## 2014-11-06 MED ORDER — THIAMINE HCL 100 MG/ML IJ SOLN
100.0000 mg | Freq: Every day | INTRAMUSCULAR | Status: DC
  Filled 2014-11-06: qty 1

## 2014-11-06 MED ORDER — SODIUM CHLORIDE 0.9 % IV SOLN: INTRAVENOUS | Status: DC

## 2014-11-06 MED ORDER — ONDANSETRON 4 MG PO TBDP: 4.0000 mg | ORAL_TABLET | Freq: Four times a day (QID) | ORAL | Status: DC | PRN

## 2014-11-06 MED ORDER — LORAZEPAM 2 MG/ML IJ SOLN: 1.0000 mg | Freq: Four times a day (QID) | INTRAMUSCULAR | Status: DC | PRN

## 2014-11-06 MED ORDER — ONDANSETRON HCL 4 MG/2ML IJ SOLN: 4.0000 mg | Freq: Three times a day (TID) | INTRAMUSCULAR | Status: DC | PRN

## 2014-11-06 MED ORDER — LORAZEPAM 1 MG PO TABS: 1.0000 mg | ORAL_TABLET | Freq: Three times a day (TID) | ORAL | Status: DC

## 2014-11-06 MED ORDER — LORAZEPAM 1 MG PO TABS: 1.0000 mg | ORAL_TABLET | Freq: Four times a day (QID) | ORAL | Status: DC | PRN

## 2014-11-06 MED ORDER — PANTOPRAZOLE SODIUM 40 MG IV SOLR
40.0000 mg | Freq: Two times a day (BID) | INTRAVENOUS | Status: DC
  Administered 2014-11-06: 40 mg via INTRAVENOUS
  Filled 2014-11-06 (×3): qty 40

## 2014-11-06 MED ORDER — VITAMIN B-1 100 MG PO TABS: 100.0000 mg | ORAL_TABLET | Freq: Every day | ORAL | Status: DC

## 2014-11-06 MED ORDER — NADOLOL 40 MG PO TABS
40.0000 mg | ORAL_TABLET | Freq: Every day | ORAL | Status: DC
  Administered 2014-11-06: 40 mg via ORAL
  Filled 2014-11-06: qty 1

## 2014-11-06 MED ORDER — ADULT MULTIVITAMIN W/MINERALS CH
1.0000 | ORAL_TABLET | Freq: Every day | ORAL | Status: DC
  Administered 2014-11-06: 1 via ORAL
  Filled 2014-11-06: qty 1

## 2014-11-06 MED ORDER — ESCITALOPRAM OXALATE 10 MG PO TABS: 10.0000 mg | ORAL_TABLET | Freq: Every day | ORAL | Status: DC

## 2014-11-06 MED ORDER — LORAZEPAM 1 MG PO TABS: 1.0000 mg | ORAL_TABLET | Freq: Two times a day (BID) | ORAL | Status: DC

## 2014-11-06 MED ORDER — ALUM & MAG HYDROXIDE-SIMETH 200-200-20 MG/5ML PO SUSP: 30.0000 mL | ORAL | Status: DC | PRN

## 2014-11-06 MED ORDER — PANTOPRAZOLE SODIUM 40 MG IV SOLR: 40.0000 mg | Freq: Two times a day (BID) | INTRAVENOUS | Status: DC

## 2014-11-06 MED ORDER — BUSPIRONE HCL 10 MG PO TABS: 10.0000 mg | ORAL_TABLET | Freq: Two times a day (BID) | ORAL | Status: DC

## 2014-11-06 MED ORDER — PANTOPRAZOLE SODIUM 40 MG PO TBEC: 40.0000 mg | DELAYED_RELEASE_TABLET | Freq: Every day | ORAL | Status: DC

## 2014-11-06 MED ORDER — SODIUM CHLORIDE 0.9 % IV SOLN
INTRAVENOUS | Status: DC
  Administered 2014-11-06: 02:00:00 via INTRAVENOUS

## 2014-11-06 NOTE — Progress Notes (Signed)
Clinical Social Work  Patient left AMA prior to CSW completing assessment.  [place], [place] [phone]

## 2014-11-06 NOTE — Discharge Instructions (Signed)
Hematemesis °This condition is the vomiting of blood. °CAUSES  °This can happen if you have a peptic ulcer or an irritation of the throat, stomach, or small bowel. Vomiting over and over again or swallowing blood from a nosebleed, coughing or facial injury can also result in bloody vomit. Anti-inflammatory pain medicines are a common cause of this potentially dangerous condition. The most serious causes of vomiting blood include: °· Ulcers (a bacteria called H. pylori is common cause of ulcers). °· Clotting problems. °· Alcoholism. °· Cirrhosis. °TREATMENT  °Treatment depends on the cause and the severity of the bleeding. Small amounts of blood streaks in the vomit is not the same as vomiting large amounts of bloody or dark, coffee grounds-like material. Weakness, fainting, dehydration, anemia, and continued alcohol or drug use increase the risk. Examination may include blood, vomit, or stool tests. The presence of bloody or dark stool that tests positive for blood (Hemoccult) means the bleeding has been going on for some time. Endoscopy and imaging studies may be done. Emergency treatment may include: °· IV medicines or fluids. °· Blood transfusions. °· Surgery. °Hospital care is required for high risk patients or when IV fluids or blood is needed. Upper GI bleeding can cause shock and death if not controlled. °HOME CARE INSTRUCTIONS  °· Your treatment does not require hospital care at this time. °· Remain at rest until your condition improves. °· Drink clear liquids as tolerated. °· Avoid: °¨ Alcohol. °¨ Nicotine. °¨ Aspirin. °¨ Any other anti-inflammatory medicine (ibuprofen, naproxen, and many others). °· Medications to suppress stomach acid or vomiting may be needed. Take all your medicine as prescribed. °· Be sure to see your caregiver for follow-up as recommended. °SEEK IMMEDIATE MEDICAL CARE IF:  °· You have repeated vomiting, dehydration, fainting, or extreme weakness. °· You are vomiting large amounts of  bloody or dark material. °· You pass large, dark or bloody stools. °Document Released: 12/15/2004 Document Revised: 01/30/2012 Document Reviewed: 12/31/2008 °Exit[employer]® Patient Information ©2015 [employer]. This information is not intended to replace advice given to you by your health care provider. Make sure you discuss any questions you have with your health care provider. ° °

## 2014-11-06 NOTE — Progress Notes (Signed)
Abnormal ekg (asymptomatic).  On call aware, troponin levels ordered

## 2014-11-06 NOTE — Consult Note (Signed)
Consultation  Referring Provider:   [facility]  Primary Care Physician:  Leandrew Koyanagi, MD Primary Gastroenterologist:  Erskine Emery, MD        Reason for Consultation: hematemesis             HPI:   Sara [name] is a 38 y.o. female with a history of ETOH cirrhosis complicated by portal HTN. She was admitted in the summer for variceal bleeding and underwent EGD with banding. Follow up EGD in Sept was normal. Patient was unable to tolerate Nadolol secondary to bradycardia / hypotension.   Unfortunately [name] has continued to drink though has been trying to cut back. She checked herself in to [facility] on Monday for detox. While there she had an episode of vomiting (non-bloody), followed later back some pink tinged emesis after eating some tomatoes. No NSAID use. No abdominal pain. No melena  Past Medical History  Diagnosis Date  . Depression   . Pneumonia   . Cirrhosis   . Portal hypertension   . S/P alcohol detoxification     2-3 days at behavioral health previously  . Alcoholism   . Anxiety   . GERD (gastroesophageal reflux disease)   . 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 Menorrhagia   . Pancytopenia 01/15/2014  . UGI bleed 06/12/2014    Past Surgical History  Procedure Laterality Date  . Cholecystectomy    . Esophagogastroduodenoscopy N/A 06/12/2014    Procedure: ESOPHAGOGASTRODUODENOSCOPY (EGD);  Surgeon: Gatha Mayer, MD;  Location: [facility];  Service: Endoscopy;  Laterality: N/A;  . Esophagogastroduodenoscopy (egd) with propofol N/A 07/29/2014    Procedure: ESOPHAGOGASTRODUODENOSCOPY (EGD) WITH PROPOFOL;  Surgeon: Inda Castle, MD;  Location: [facility];  Service: Endoscopy;  Laterality: N/A;    Family History  Problem Relation Age of Onset  . Colon polyps Mother   . Hypertension Mother   . Thyroid disease Mother   . Alcoholism Mother   . Alcoholism Father   . Alcohol abuse Maternal Grandfather   . Alcohol abuse  Paternal Grandfather   . Alcohol abuse Paternal Aunt   . Alcohol abuse Maternal Uncle      History  Substance Use Topics  . Smoking status: Never Smoker   . Smokeless tobacco: Never Used  . Alcohol Use: 42.0 oz/week    70 Glasses of wine per week     Comment: reports 3 bottle of wine and pint of or more of vodka daily. 2 bottles of wine currently and pint of vodka some days for at least 4-5 days.a month.      Prior to Admission medications   Medication Sig Start Date End Date Taking? Authorizing Provider  folic acid (FOLVITE) 1 MG tablet Take 1 mg by mouth daily.   Yes Historical Provider, MD  Multiple Vitamin (MULTIVITAMIN WITH MINERALS) TABS tablet Take 1 tablet by mouth daily. For low vitamin 10/04/14  Yes Kennedy Bucker, NP  pantoprazole (PROTONIX) 40 MG tablet Take 40 mg by mouth daily.   Yes Historical Provider, MD    Current Facility-Administered Medications  Medication Dose Route Frequency Provider Last Rate Last Dose  . 0.9 %  sodium chloride infusion   Intravenous Continuous Ivor Costa, MD 100 mL/hr at 11/06/14 0149    . busPIRone (BUSPAR) tablet 7.5 mg  7.5 mg Oral BID Ivor Costa, MD   7.5 mg at 11/06/14 0150  . cefTRIAXone (ROCEPHIN) 1 g in dextrose 5 % 50 mL IVPB - Premix  1 g  Intravenous Q24H Ivor Costa, MD   1 g at 11/06/14 0200  . escitalopram (LEXAPRO) tablet 10 mg  10 mg Oral Daily Ivor Costa, MD      . folic acid (FOLVITE) tablet 1 mg  1 mg Oral Daily Ivor Costa, MD      . LORazepam (ATIVAN) injection 0-4 mg  0-4 mg Intravenous Q6H Ivor Costa, MD   2 mg at 11/06/14 7846   Followed by  . [START ON 11/08/2014] LORazepam (ATIVAN) injection 0-4 mg  0-4 mg Intravenous Q12H Ivor Costa, MD      . LORazepam (ATIVAN) tablet 1 mg  1 mg Oral Q6H PRN Ivor Costa, MD       Or  . LORazepam (ATIVAN) injection 1 mg  1 mg Intravenous Q6H PRN Ivor Costa, MD      . multivitamin with minerals tablet 1 tablet  1 tablet Oral Daily Ivor Costa, MD      . nadolol (CORGARD) tablet 40 mg  40 mg Oral  Daily Ivor Costa, MD      . ondansetron [facility]) injection 4 mg  4 mg Intravenous Q8H PRN Ivor Costa, MD   4 mg at 11/06/14 0819  . pantoprazole (PROTONIX) injection 40 mg  40 mg Intravenous Q12H Ivor Costa, MD   40 mg at 11/06/14 0150  . sodium chloride 0.9 % injection 3 mL  3 mL Intravenous Q12H Ivor Costa, MD   3 mL at 11/06/14 0153  . thiamine (VITAMIN B-1) tablet 100 mg  100 mg Oral Daily Ivor Costa, MD       Or  . thiamine (B-1) injection 100 mg  100 mg Intravenous Daily Ivor Costa, MD      . traZODone (DESYREL) tablet 50 mg  50 mg Oral QHS Ivor Costa, MD   50 mg at 11/06/14 0152    Allergies as of 11/05/2014 - Review Complete 11/05/2014  Allergen Reaction Noted  . Morphine and related Other (See Comments) 03/11/2013  . Morphine and related Other (See Comments) 04/22/2014  . Nsaids Other (See Comments) 07/18/2014    Review of Systems:    All systems reviewed and negative except where noted in HPI.    Physical Exam:  Vital signs in last 24 hours: Temp:  [98.4 F (36.9 C)-98.7 F (37.1 C)] 98.6 F (37 C) (12/17 0530) Pulse Rate:  [77-101] 77 (12/17 0530) Resp:  [16-18] 16 (12/17 0530) BP: (105-127)/(59-66) 105/63 mmHg (12/17 0530) SpO2:  [97 %-100 %] 97 % (12/17 0530) Weight:  [179 lb 10.8 oz (81.5 kg)] 179 lb 10.8 oz (81.5 kg) (12/17 0020) Last BM Date: 11/05/14 General:   Pleasant Hispanic female in NAD Head:  Normocephalic and atraumatic. Eyes:   No icterus.   Conjunctiva pink. Ears:  Normal auditory acuity. Neck:  Supple; no masses felt Lungs:  Respirations even and unlabored. Lungs clear to auscultation bilaterally.     Heart:  Regular rate and rhythm Abdomen:  Soft, nondistended, nontender. Normal bowel sounds. No appreciable masses or hepatomegaly.  Msk:  Symmetrical without gross deformities.  Extremities:  Without edema. Neurologic:  Alert and  oriented x4;  grossly normal neurologically. Skin:  Intact without significant lesions or rashes. Cervical Nodes:  No  significant cervical adenopathy. Psych:  Alert and cooperative. Normal affect.  LAB RESULTS:  Recent Labs  11/05/14 1712 11/06/14 0150 11/06/14 0710  WBC 2.7* 2.7* 2.3*  HGB 8.7* 8.5* 8.6*  HCT 29.1* 27.9* 28.0*  PLT 33* 32* 28*   BMET  Recent  Labs  11/04/14 1748 11/05/14 1712 11/06/14 0150  NA 140 132* 136*  K 4.2 3.7 3.5*  CL 102 95* 98  CO2 23 25 25   GLUCOSE 111* 122* 98  BUN 6 6 6   CREATININE 0.39* 0.45* 0.49*  CALCIUM 8.8 9.2 9.2   LFT  Recent Labs  11/06/14 0150  PROT 7.5  ALBUMIN 3.0*  AST 124*  ALT 34  ALKPHOS 93  BILITOT 2.2*   PT/INR  Recent Labs  11/06/14 0150  LABPROT 16.4*  INR 1.30    STUDIES: Dg Chest 2 View  11/04/2014   CLINICAL DATA:  Acute chest pain.  EXAM: CHEST  2 VIEW  COMPARISON:  June 11, 2014.  FINDINGS: The heart size and mediastinal contours are within normal limits. Both lungs are clear. No pneumothorax or pleural effusion is noted. The visualized skeletal structures are unremarkable.  IMPRESSION: No acute cardiopulmonary abnormality seen.   Electronically Signed   By: Sabino Dick M.D.   On: 11/04/2014 19:16    PREVIOUS ENDOSCOPIES:            Last EGD Sept 2015 was normal (post-banding follow up)   Impression / Plan:   9. 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 Mildly coagulopathic. Currently admitted from [facility] for vomiting x2. Second episode pinkish color, possibly blood tinged material but had eaten tomatoes. If true hematemesis then seems to be low volume bleed. BUN normal. Hgb at about baseline.   Continue bid PPI  Continue Rocephin for precautionary measures against SBP  Monitor for further vomiting   Monitor hgb  Fine to continue nadolol as ordered though may not tolerate it  +/- EGD. Last one in Sept was normal but she has continued to drink and not on nadolol at home.   Keep NPO for now.  2. ETOH abuse, she was trying to detox when acute events occurred.   3.  Pancytopenia with significant thrombocytopenia secondary to cirrhosis. Platelets in the 20's.   Thanks   LOS: 0 days   Tye Savoy  11/06/2014, 9:14 AM

## 2014-11-06 NOTE — Progress Notes (Signed)
CRITICAL VALUE ALERT  Critical value received:  Platelets 28   Date of notification:  11/06/14  Time of notification:  0109  Critical value read back:Yes.    Nurse who received alert:  C. Young  MD notified (1st page):  Dhungel  Time of first page:  0729  MD notified (2nd page):  Time of second page:  Responding MD:    Time MD responded:

## 2014-11-06 NOTE — H&P (Signed)
[facility] History and Physical  Adilen Pavelko HYW:[id number] DOB: 12-23-1975 DOA: 11/05/2014  Referring physician: ED physician PCP: Leandrew Koyanagi, MD  Specialists:   Chief Complaint: Hematemesis  HPI: Sara [name] is a 38 y.o. female with past medical history of alcohol abuse, alcohol induced cirrhosis, portal hypertension, history of GI bleeding (s/p of banding of esophageal varices in July), depression, anxiety, who presents with hematemesis.  Patient is heavy alcohol drinker. She drinks everyday, 3 bottles of wine daily. Patient was evaluated in the ED yesterday for alcohol abuse and admitted to [facility] for detox today. After having lunch today, she began to get nauseated, causing her to vomit once with bright red blood in the vomitus. She reports feeling anxious, but denies fever, chills, dizziness, headaches, cough, chest pain, SOB, abdominal pain, diarrhea, constipation, dysuria, urgency, frequency, hematuria, skin rashes, joint pain or leg swelling. Of note, most recent EGD on 07/29/14 showed normal esophagus without visible varices.  Work up in the ED demonstrates hgb dropped from 10.0 on 11/04/14 to 8.7 on this admission. She had negative troponin, negative UDS, negative pregnancy test yesterday. Her alcohol level was 372 yesterday. Patient has negative hepatitis panel on 07/04/14. Patient is admitted to inpatient for further evaluation and treatment. GI was consulted by ED.  Review of Systems: As presented in the history of presenting illness, rest negative.  Where does patient live?  At home Can patient participate in ADLs? Yes  Allergy:   Allergies   Allergen  Reactions   .  Morphine And Related  Other (See Comments)       Slowed HR, lowered BP   .  Morphine And Related  Other (See Comments)       hypotension   .  Nsaids  Other (See Comments)       bleeding       Past Medical History   Diagnosis  Date   .  Depression     .  Pneumonia     .  Cirrhosis     .   Portal hypertension     .  S/P alcohol detoxification         2-3 days at behavioral health previously   .  Alcoholism     .  Anxiety     .  GERD (gastroesophageal reflux disease)     .  Esophageal varices with bleeding(456.0)  06/13/2014   .  Menorrhagia     .  Pancytopenia  01/15/2014   .  UGI bleed  06/12/2014       Past Surgical History   Procedure  Laterality  Date   .  Cholecystectomy       .  Esophagogastroduodenoscopy  N/A  06/12/2014       Procedure: ESOPHAGOGASTRODUODENOSCOPY (EGD);  Surgeon: Gatha Mayer, MD;  Location: [facility];  Service: Endoscopy;  Laterality: N/A;   .  Esophagogastroduodenoscopy (egd) with propofol  N/A  07/29/2014       Procedure: ESOPHAGOGASTRODUODENOSCOPY (EGD) WITH PROPOFOL;  Surgeon: Inda Castle, MD;  Location: [facility];  Service: Endoscopy;  Laterality: N/A;     Social History:  reports that she has never smoked. She has never used smokeless tobacco. She reports that she drinks about 42.0 oz of alcohol per week. She reports that she uses illicit drugs (Cocaine and Marijuana).  Family History:  Family History   Problem  Relation  Age of Onset   .  Colon polyps  Mother     .  Hypertension  Mother     .  Thyroid disease  Mother     .  Alcoholism  Mother     .  Alcoholism  Father     .  Alcohol abuse  Maternal Grandfather     .  Alcohol abuse  Paternal Grandfather     .  Alcohol abuse  Paternal Aunt     .  Alcohol abuse  Maternal Uncle          Prior to Admission medications    Medication  Sig  Start Date  End Date  Taking?  Authorizing Provider   escitalopram (LEXAPRO) 5 MG tablet  Take 2 tablets (10 mg total) by mouth daily.  Patient not taking: Reported on 11/04/2014  10/04/14      Kennedy Bucker, NP   folic acid (FOLVITE) 1 MG tablet  Take 1 mg by mouth daily.        Historical Provider, MD   Multiple Vitamin (MULTIVITAMIN WITH MINERALS) TABS tablet  Take 1 tablet by mouth daily. For low vitamin  10/04/14      Kennedy Bucker, NP    nadolol (CORGARD) 40 MG tablet  Take 1 tablet (40 mg total) by mouth daily.  Patient not taking: Reported on 11/04/2014  10/06/14      Lahoma Crocker, MD   pantoprazole (PROTONIX) 40 MG tablet  Take 40 mg by mouth daily.        Historical Provider, MD   Tetrahydrozoline HCl (VISINE OP)  Place 1 drop into both eyes 2 (two) times daily as needed (dry eyes).         Historical Provider, MD     Physical Exam: Filed Vitals:     11/05/14 1423  11/05/14 1647   BP:  134/67  120/63   Pulse:  87  81   Temp:  98.5 F (36.9 C)  98.7 F (37.1 C)   TempSrc:  Oral  Oral   Resp:  16  16   Height:  5\' 3"  (1.6 m)     Weight:  77.111 kg (170 lb)     SpO2:    98%    General: Not in acute distress. Anxious, mildly tremulous. HEENT:       Eyes: PERRL, EOMI, no scleral icterus       ENT: No discharge from the ears and nose, no pharynx injection, no tonsillar enlargement.         Neck: No JVD, no bruit, no mass felt. Cardiac: S1/S2, RRR, No murmurs, No gallops or rubs Pulm: Good air movement bilaterally. Clear to auscultation bilaterally. No rales, wheezing, rhonchi or rubs. Abd: Soft, nondistended, nontender, no rebound pain, no organomegaly, BS present Ext: No edema bilaterally. 2+DP/PT pulse bilaterally Musculoskeletal: No joint deformities, erythema, or stiffness, ROM full Skin: No rashes.   Neuro: Alert and oriented X3, cranial nerves II-XII grossly intact, muscle strength 5/5 in all extremeties, sensation to light touch intact. Brachial reflex 2+ bilaterally. Knee reflex 1+ bilaterally. Negative Babinski's sign. Normal finger to nose test. Psych: Patient is not psychotic, no suicidal or hemocidal ideation.  Labs on Admission:  Basic Metabolic Panel:  Last Labs      Recent Labs Lab  11/04/14 1748  11/05/14 1712   NA  140  132*   K  4.2  3.7   CL  102  95*   CO2  23  25   GLUCOSE  111*  122*   BUN  6  6  CREATININE  0.39*  0.45*   CALCIUM  8.8  9.2      Liver Function Tests:   Last Labs      Recent Labs Lab  11/04/14 1748   AST  179*   ALT  46*   ALKPHOS  115   BILITOT  1.3*   PROT  8.5*   ALBUMIN  3.5       Last Labs     No results for input(s): LIPASE, AMYLASE in the last 168 hours.    Last Labs     No results for input(s): AMMONIA in the last 168 hours.   CBC:  Last Labs      Recent Labs Lab  11/04/14 1748  11/05/14 1712   WBC  4.9  2.7*   NEUTROABS   --   1.6*   HGB  10.0*  8.7*   HCT  32.9*  29.1*   MCV  89.4  89.8   PLT  53*  33*      Cardiac Enzymes:  Last Labs      Recent Labs Lab  11/04/14 1814   TROPONINI  <0.30       BNP (last 3 results)  Recent Labs (within last 365 days)     Recent Labs   06/11/14 2315   PROBNP  5.0      CBG:  Last Labs     No results for input(s): GLUCAP in the last 168 hours.    Radiological Exams on Admission:  Imaging Results (Last 48 hours)    Dg Chest 2 View  11/04/2014   CLINICAL DATA:  Acute chest pain.  EXAM: CHEST  2 VIEW  COMPARISON:  June 11, 2014.  FINDINGS: The heart size and mediastinal contours are within normal limits. Both lungs are clear. No pneumothorax or pleural effusion is noted. The visualized skeletal structures are unremarkable.  IMPRESSION: No acute cardiopulmonary abnormality seen.   Electronically Signed   By: Sabino Dick M.D.   On: 11/04/2014 19:16      EKG: not done yet, will get one.  Assessment/Plan Principal Problem:   Hematemesis Active Problems:   Anxiety state   Cirrhosis with alcoholism   Depression   GERD (gastroesophageal reflux disease)   Portal hypertension   Anemia   Substance induced mood disorder   Post traumatic stress disorder (PTSD)   Esophageal varices   Thrombocytopenia   Alcohol dependence  Hematemesis: DD include alcoholic gastritis and esophageal varices bleeding. patient had banding of esophageal varus is in July, but  most recent EGD on 07/29/14 showed normal esophagus without visible varices, making alcoholic gastritis more  likely the diagnosis. - will admit to tele bed - GI consulted by Ed - NPO for possible EGD - NS at 100 mL/hr - Start IV pantoprazole 40 mg bib - Zofran IV for nausea - IV Rocephin 1 g X 7 days for PPx   - Avoid NSAIDs and SQ heparin - Monitor closely and follow q6h cbc, transfuse as necessary. - LaB: INR, PTT, Lactate, FOBT  Alcoholic cirrhosis and esophageal varices -start IV rocephin for SBP ppx in the setting of acute GIB. -continue Nadolol  Pancytopenia: Likely due to alcohol induced bone marrow suppression. Anemia panel showed iron deficiency on 09/12/14. -cbc q6h -may start Iron supplement after stabilized at discharge  Alcohol abuse: -Started detoxification yesterday, may go back to detx at discharge -CIWA  Pychiatric issues: -continue buspirone, Lexapro, trazodone which was started yesterday by psych  Abnormal EKG:  Patient initial EKG did not have significant abnormalities. The repeated EKG in the morning showed early R-wave progression with very high voltage. Unclear etiology. patient has no chest pain.  -will get stat trop and followed by trop q6h   DVT ppx: scd  Code Status: Full code Family Communication: None at bed side.  Disposition Plan: Admit to inpatient   Date of Service 11/05/2014     Ivor Costa [facility] Pager [phone]  If 7PM-7AM, please contact night-coverage [email or web address] Password [email or web address] 11/05/2014, 7:17 PM

## 2014-11-06 NOTE — Discharge Summary (Signed)
Physician Discharge Summary  Claude Swendsen NWG:[id number] DOB: 15-Dec-1975 DOA: 11/06/2014  PCP: Leandrew Koyanagi, MD  Admit date: 11/06/2014 Discharge date: 11/06/2014  Time spent: 30 minutes  Recommendations for Outpatient Follow-up:  1. Signed out AMA 2. Patient plans to go to [place] for 2-3 months tomorrow morning. Also plans on checking in to alcohol rehab program there. 3. Follow up with GI as scheduled  Discharge Diagnoses:  Principal Problem:   Hematemesis  Active Problems:   Anxiety state   Cirrhosis with alcoholism   Depression   GERD (gastroesophageal reflux disease)   Portal hypertension   Alcohol dependence syndrome   Esophageal varices   Pancytopenia   Discharge Condition: signed out AMA  Diet recommendation: regular  Filed Weights   11/06/14 0020  Weight: 81.5 kg (179 lb 10.8 oz)    History of present illness:  38 y.o. female with past medical history of alcohol abuse, alcoholic cirrhosis, portal hypertension, history of GI bleeding (s/p of banding of esophageal varices in July), depression, anxiety, who presented to ED  with hematemesis.  Patient had been sober for few months but again picked up heavy drinking. She drinks 3 bottles of wine daily. Patient was evaluated in the ED yesterday for alcohol abuse and admitted to [facility] for detox After having lunch on 12/16,  she was nauseous with one episode of hematemesis containing bright red blood. She reported feeling anxious, but denied fever, chills, dizziness, headaches, cough, chest pain, SOB, abdominal pain, diarrhea, constipation, dysuria, urgency, frequency, hematuria, skin rashes, joint pain or leg swelling. Of note, most recent EGD on 07/29/14 showed normal esophagus without visible varices.  Work up in the ED demonstrates hgb dropped from 10.0 on 11/04/14 to 8.7 . She had negative troponin, negative UDS, negative pregnancy test .Her alcohol level was 372 on 12/15. admitted to inpatient for further  evaluation and treatment.  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 patient had banding of esophageal varus is in July, but most recent EGD on 07/29/14 showed normal esophagus without visible varices, making alcoholic gastritis more likely GI consulted, who were planning on repeat EGD. Agreed with keeping NPO, empiric rocephin for SBP prophylaxis, nadolol and BID PPI. -H&h stable and no further hematemesis. -patient during rounds did not wish to stay further and wanted to go home. She stated she was flying out to [place] tomorrow morning on vacation. She would be staying [place] for 2-3 months and planned on checking into rehab there ( she reports going to rehab there in the past) .  i told her she was not stable to be discharged home and needed further monitoring and w/up. Patient however did not wish to stay back and wanted to sign herself out.  pt explained in detail of the  health risk including death on signing herself out and understands it well.  i have encouraged her to continue taking nadolol and PPI. Written Prescriptions given for both with refills.   Alcoholic cirrhosis and esophageal varices -started on  IV rocephin for SBP ppx in the setting of acute GIB. 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 Anemia panel showed iron deficiency. -low Hb and platelets . No need vfor trasnfusion  Alcohol abuse: -started o. detox few days back. No signs of withdrawal. counseled on cessation. i strongly feel she is goignt o be drinking again.  Anxiety and depression -started  on  buspirone, Lexapro, trazodone by psych on 12/15. i will not write any prescriptions for her at this time as she is not going to see a psychiatrist for next 2-3 months at least.  Abnormal EKG:  showed early R-wave progression with very high voltage. Unclear  etiology. No chest pain, stable on monitor. Serial troponins are negative.    Code Status: Full code Family Communication: None at bed side.  Disposition Plan: signed out AMA    Discharge Exam: Filed Vitals:   11/06/14 0530  BP: 105/63  Pulse: 77  Temp: 98.6 F (37 C)  Resp: 16    General: middle aged female in NAD HEENT: pallor+, moist mucosa, no icterus Chest: clear b/l  CVS: NS1&S2, no murmurs  abd: soft, NT, ND BS+ Ext: warm, no edema  CNS: AAOX3, no tremors   Discharge Instructions You were cared for by a hospitalist during your hospital stay. If you have any questions about your discharge medications or the care you received while you were in the hospital after you are discharged, you can call the unit and asked to speak with the hospitalist on call if the hospitalist that took care of you is not available. Once you are discharged, your primary care physician will handle any further medical issues. Please note that NO REFILLS for any discharge medications will be authorized once you are discharged, as it is imperative that you return to your primary care physician (or establish a relationship with a primary care physician if you do not have one) for your aftercare needs so that they can reassess your need for medications and monitor your lab values.   Current Discharge Medication List    CONTINUE these medications which have CHANGED   Details  nadolol (CORGARD) 40 MG tablet Take 1 tablet (40 mg total) by mouth daily. Qty: 30 tablet, Refills: 2    pantoprazole (PROTONIX) 40 MG tablet Take 1 tablet (40 mg total) by mouth daily. Qty: 30 tablet, Refills: 2      CONTINUE these medications which have NOT CHANGED   Details  folic acid (FOLVITE) 1 MG tablet Take 1 mg by mouth daily.    Multiple Vitamin (MULTIVITAMIN WITH MINERALS) TABS tablet Take 1 tablet by mouth daily. For low vitamin      STOP taking these medications     escitalopram (LEXAPRO) 5 MG tablet         Allergies  Allergen Reactions  . Morphine And Related Other (See Comments)    Slowed HR, lowered BP  . Morphine And Related Other (See Comments)    hypotension  . Nsaids Other (See Comments)    bleeding      The results of significant diagnostics from this hospitalization (including imaging, microbiology, ancillary and laboratory) are listed below for reference.    Significant Diagnostic Studies: Dg Chest 2 View  11/04/2014   CLINICAL DATA:  Acute chest pain.  EXAM: CHEST  2 VIEW  COMPARISON:  June 11, 2014.  FINDINGS: The heart size and mediastinal contours are within normal limits. Both lungs are clear. No pneumothorax or pleural effusion is noted. The visualized skeletal structures are unremarkable.  IMPRESSION: No acute cardiopulmonary abnormality seen.   Electronically Signed   By: Sabino Dick M.D.   On: 11/04/2014 19:16    Microbiology: No results found for this or any previous visit (from the past 240 hour(s)).   Labs: Basic Metabolic Panel:  Recent Labs Lab 11/04/14 1748 11/05/14 1712 11/06/14 0150  NA  140 132* 136*  K 4.2 3.7 3.5*  CL 102 95* 98  CO2 23 25 25   GLUCOSE 111* 122* 98  BUN 6 6 6   CREATININE 0.39* 0.45* 0.49*  CALCIUM 8.8 9.2 9.2   Liver Function Tests:  Recent Labs Lab 11/04/14 1748 11/06/14 0150  AST 179* 124*  ALT 46* 34  ALKPHOS 115 93  BILITOT 1.3* 2.2*  PROT 8.5* 7.5  ALBUMIN 3.5 3.0*   No results for input(s): LIPASE, AMYLASE in the last 168 hours. No results for input(s): AMMONIA in the last 168 hours. CBC:  Recent Labs Lab 11/04/14 1748 11/05/14 1712 11/06/14 0150 11/06/14 0710  WBC 4.9 2.7* 2.7* 2.3*  NEUTROABS  --  1.6*  --   --   HGB 10.0* 8.7* 8.5* 8.6*  HCT 32.9* 29.1* 27.9* 28.0*  MCV 89.4 89.8 90.0 90.0  PLT 53* 33* 32* 28*   Cardiac Enzymes:  Recent Labs Lab 11/04/14 1814 11/06/14 0710  TROPONINI <0.30 <0.30   BNP: BNP (last 3 results)  Recent Labs  06/11/14 2315  PROBNP 5.0    CBG:  Recent Labs Lab 11/06/14 0905  GLUCAP 102*       Signed:  Maiya Kates  [facility] 11/06/2014, 1:14 PM

## 2014-11-08 ENCOUNTER — Ambulatory Visit (INDEPENDENT_AMBULATORY_CARE_PROVIDER_SITE_OTHER): Payer: 59 | Admitting: Family Medicine

## 2014-11-08 ENCOUNTER — Ambulatory Visit (INDEPENDENT_AMBULATORY_CARE_PROVIDER_SITE_OTHER): Payer: 59

## 2014-11-08 VITALS — BP 94/56 | HR 82 | Temp 98.2°F | Resp 16 | Ht 63.5 in | Wt 176.0 lb

## 2014-11-08 DIAGNOSIS — M79671 Pain in right foot: Secondary | ICD-10-CM

## 2014-11-08 DIAGNOSIS — W108XXA Fall (on) (from) other stairs and steps, initial encounter: Secondary | ICD-10-CM

## 2014-11-08 NOTE — Patient Instructions (Signed)
Wear Cam Walker for the next week or 2.   Minimize time on that foot  Ice the heel for 5 times daily for about 10 or 15 minutes  Tylenol 500 mg 2 pills maximum 3 times daily but after 3 or 4 days decreased to only twice daily  Return if not improving

## 2014-11-08 NOTE — Progress Notes (Signed)
Subjective: 38 year old lady who fell approximately a week ago down a couple of stairs. Apparently her dog bumped her and helped to cause fall. Since that time she was hospitalized for couple of days with alcohol-related issues, but denies that she was intoxicated at the time of the fall. She has not had any major foot problems in the past. She has been having trouble walking on the right heel. She also has been doing some exercise program of late with [employer]. She wears ordinary shoes, some padded cold weather shoes currently.  Objective: Tender in the calcaneus of the right heel. It seems to hurt more on the sole of the calcaneus toward the distal portion, but not quite as far forward as typical with plantar fasciitis. She has fair range of motion of the ankle, and dorsi flexion or plantar flexion did not seem to bring on the bad pain. Patient walks on her tiptoes.  Assessment: Fall on stairs Right heel and foot pain  Plan: X-rays right heel  [facility] reading (PRIMARY) by  Dr. Linna Darner Normal heel  Short Cam Walker  Tylenol for pain.

## 2014-11-10 ENCOUNTER — Telehealth: Payer: Self-pay

## 2014-11-10 ENCOUNTER — Other Ambulatory Visit: Payer: Self-pay

## 2014-11-10 NOTE — Telephone Encounter (Signed)
Letter mailed

## 2014-11-10 NOTE — Telephone Encounter (Signed)
-----   Message from Inda Castle, MD sent at 11/10/2014  9:15 AM EST ----- Please discharge this pt from our practice. ----- Message -----    From: Ladene Artist, MD    Sent: 11/06/2014   3:06 PM      To: Inda Castle, MD  Patient left AMA from [place] saw her in hospital. Linna Hoff didn't see her before she signed out. We leave to you to decide about discharge from our practice.

## 2014-11-12 LAB — CULTURE, BLOOD (ROUTINE X 2)
CULTURE: NO GROWTH
Culture: NO GROWTH

## 2014-11-12 NOTE — Progress Notes (Signed)
  CARE MANAGEMENT ED NOTE 11/12/2014  Patient:  CHONTE, RICKE   Account Number:  [id number]  Date Initiated:  11/12/2014  Documentation initiated by:  Jackelyn Poling  Subjective/Objective Assessment:   38 yr old [place] [facility] pt came through [facility] ED on 11/05/14 presenting from [facility] where she was in detox from alcohol and had an episode of hematemesis, therefore sent to [facility] ED GI consult ordered. never seen by GI r/t AMA     Subjective/Objective Assessment Detail:   pt left ama from [facility]E on 11/06/14  pcp dr Tami Lin     Action/Plan:   pt with 6 ED visits and 4 admissions in last 6 months for etoh and emesis _ noncompliance issue noted left AMA on 11/06/14 prior to GI consult after bloody emesis -completed [facility] stay before last ED visit for bloody emesis   Action/Plan Detail:   Anticipated DC Date:  11/06/2014     Status Recommendation to Physician:   Result of Recommendation:    Other ED [place]  Other  Outpatient Services - Pt will follow up  PCP issues    Choice offered to / List presented to:            Status of service:  Completed, signed off  ED Comments:   ED Comments Detail:

## 2014-11-17 ENCOUNTER — Encounter: Payer: Self-pay | Admitting: *Deleted

## 2014-11-27 DIAGNOSIS — F1010 Alcohol abuse, uncomplicated: Secondary | ICD-10-CM | POA: Insufficient documentation

## 2014-12-20 ENCOUNTER — Encounter (HOSPITAL_COMMUNITY): Payer: Self-pay

## 2014-12-20 ENCOUNTER — Emergency Department (HOSPITAL_COMMUNITY)
Admission: EM | Admit: 2014-12-20 | Discharge: 2014-12-22 | Disposition: A | Discharge status: Other Acute Inpatient Hospital | Payer: 59 | Attending: Emergency Medicine | Admitting: Emergency Medicine

## 2014-12-20 DIAGNOSIS — F1010 Alcohol abuse, uncomplicated: Secondary | ICD-10-CM | POA: Diagnosis present

## 2014-12-20 DIAGNOSIS — Z8701 Personal history of pneumonia (recurrent): Secondary | ICD-10-CM | POA: Diagnosis not present

## 2014-12-20 DIAGNOSIS — F1024 Alcohol dependence with alcohol-induced mood disorder: Secondary | ICD-10-CM | POA: Diagnosis not present

## 2014-12-20 DIAGNOSIS — Z862 Personal history of diseases of the blood and blood-forming organs and certain disorders involving the immune mechanism: Secondary | ICD-10-CM | POA: Insufficient documentation

## 2014-12-20 DIAGNOSIS — Z8719 Personal history of other diseases of the digestive system: Secondary | ICD-10-CM | POA: Diagnosis not present

## 2014-12-20 DIAGNOSIS — F1310 Sedative, hypnotic or anxiolytic abuse, uncomplicated: Secondary | ICD-10-CM | POA: Insufficient documentation

## 2014-12-20 DIAGNOSIS — F10230 Alcohol dependence with withdrawal, uncomplicated: Secondary | ICD-10-CM | POA: Diagnosis not present

## 2014-12-20 DIAGNOSIS — Z8679 Personal history of other diseases of the circulatory system: Secondary | ICD-10-CM | POA: Diagnosis not present

## 2014-12-20 DIAGNOSIS — Z87448 Personal history of other diseases of urinary system: Secondary | ICD-10-CM | POA: Diagnosis not present

## 2014-12-20 DIAGNOSIS — F1020 Alcohol dependence, uncomplicated: Secondary | ICD-10-CM | POA: Diagnosis present

## 2014-12-20 DIAGNOSIS — F10239 Alcohol dependence with withdrawal, unspecified: Secondary | ICD-10-CM | POA: Diagnosis not present

## 2014-12-20 LAB — CBC WITH DIFFERENTIAL/PLATELET
BASOS ABS: 0 10*3/uL (ref 0.0–0.1)
BASOS PCT: 0 % (ref 0–1)
Eosinophils Absolute: 0.1 10*3/uL (ref 0.0–0.7)
Eosinophils Relative: 1 % (ref 0–5)
HEMATOCRIT: 31.5 % — AB (ref 36.0–46.0)
Hemoglobin: 9.7 g/dL — ABNORMAL LOW (ref 12.0–15.0)
Lymphocytes Relative: 62 % — ABNORMAL HIGH (ref 12–46)
Lymphs Abs: 3.3 10*3/uL (ref 0.7–4.0)
MCH: 27.9 pg (ref 26.0–34.0)
MCHC: 30.8 g/dL (ref 30.0–36.0)
MCV: 90.5 fL (ref 78.0–100.0)
MONO ABS: 0.2 10*3/uL (ref 0.1–1.0)
Monocytes Relative: 4 % (ref 3–12)
Neutro Abs: 1.8 10*3/uL (ref 1.7–7.7)
Neutrophils Relative %: 33 % — ABNORMAL LOW (ref 43–77)
PLATELETS: 73 10*3/uL — AB (ref 150–400)
RBC: 3.48 MIL/uL — ABNORMAL LOW (ref 3.87–5.11)
RDW: 20 % — ABNORMAL HIGH (ref 11.5–15.5)
WBC: 5.4 10*3/uL (ref 4.0–10.5)

## 2014-12-20 LAB — COMPREHENSIVE METABOLIC PANEL
ALK PHOS: 105 U/L (ref 39–117)
ALT: 39 U/L — ABNORMAL HIGH (ref 0–35)
ANION GAP: 11 (ref 5–15)
AST: 127 U/L — ABNORMAL HIGH (ref 0–37)
Albumin: 3.5 g/dL (ref 3.5–5.2)
BILIRUBIN TOTAL: 2 mg/dL — AB (ref 0.3–1.2)
BUN: <5 mg/dL — ABNORMAL LOW (ref 6–23)
CALCIUM: 8.6 mg/dL (ref 8.4–10.5)
CO2: 24 mmol/L (ref 19–32)
Chloride: 108 mmol/L (ref 96–112)
Creatinine, Ser: 0.39 mg/dL — ABNORMAL LOW (ref 0.50–1.10)
GFR calc Af Amer: >90 mL/min (ref >90)
GFR calc non Af Amer: >90 mL/min (ref >90)
GLUCOSE: 111 mg/dL — AB (ref 70–99)
Potassium: 3.8 mmol/L (ref 3.5–5.1)
SODIUM: 143 mmol/L (ref 135–145)
Total Protein: 8.1 g/dL (ref 6.0–8.3)

## 2014-12-20 LAB — PROTIME-INR
INR: 1.29 (ref 0.00–1.49)
Prothrombin Time: 16.2 seconds — ABNORMAL HIGH (ref 11.6–15.2)

## 2014-12-20 LAB — LIPASE, BLOOD: LIPASE: 58 U/L (ref 11–59)

## 2014-12-20 LAB — TYPE AND SCREEN
ABO/RH(D): A POS
Antibody Screen: NEGATIVE

## 2014-12-20 LAB — ETHANOL: ALCOHOL ETHYL (B): 420 mg/dL — AB (ref 0–9)

## 2014-12-20 LAB — APTT: aPTT: 42 seconds — ABNORMAL HIGH (ref 24–37)

## 2014-12-20 MED ORDER — LORAZEPAM 1 MG PO TABS
0.0000 mg | ORAL_TABLET | Freq: Four times a day (QID) | ORAL | Status: DC
  Administered 2014-12-20 – 2014-12-21 (×3): 1 mg via ORAL
  Filled 2014-12-20 (×3): qty 1

## 2014-12-20 MED ORDER — LORAZEPAM 1 MG PO TABS: 0.0000 mg | ORAL_TABLET | Freq: Two times a day (BID) | ORAL | Status: DC

## 2014-12-20 MED ORDER — ACETAMINOPHEN 325 MG PO TABS: 650.0000 mg | ORAL_TABLET | ORAL | Status: DC | PRN

## 2014-12-20 MED ORDER — LORAZEPAM 1 MG PO TABS
2.0000 mg | ORAL_TABLET | Freq: Once | ORAL | Status: AC
  Administered 2014-12-20: 2 mg via ORAL
  Filled 2014-12-20: qty 2

## 2014-12-20 MED ORDER — SODIUM CHLORIDE 0.9 % IV SOLN: INTRAVENOUS | Status: DC

## 2014-12-20 MED ORDER — ONDANSETRON HCL 4 MG PO TABS
8.0000 mg | ORAL_TABLET | Freq: Three times a day (TID) | ORAL | Status: DC | PRN
  Administered 2014-12-20 – 2014-12-21 (×2): 8 mg via ORAL
  Filled 2014-12-20 (×2): qty 2

## 2014-12-20 NOTE — ED Notes (Signed)
Pt. To [facility] from ED ambulatory without difficulty, to room 37 . Report from [employer]. Pt. Is alert and oriented, warm and dry in no distress. Pt. Denies SI, HI, and AVH. Pt. Calm and cooperative. Pt. Made aware of security cameras and Q15 minute rounds. Pt. Encouraged to let Nursing staff know of any concerns or needs. Pt. C/o nausea.

## 2014-12-20 NOTE — ED Provider Notes (Signed)
CSN: [id number]     Arrival date & time 12/20/14  1621 History   First MD Initiated Contact with Patient 12/20/14 1650     Chief Complaint  Patient presents with  . alcohol detox   . Epistaxis     (Consider location/radiation/quality/duration/timing/severity/associated sxs/prior Treatment) HPI Comments: Patient here requesting detox from alcohol. States that for the last month she has used approximately 3 bottles of wine per day. Denies any suicidal homicidal ideations. Has had some nosebleeds which were controlled with direct pressure. Review the old records show that patient does have a history of thrombocytopenia as well as anemia. She was admitted to the hospital for possible gastric bleeding but left AMA. Patient just completed according to her an inpatient rehabilitation stay in [place] within the last month. Denies any current abdominal pain. No black stools. No sick appearance and be. Last use of alcohol was today. No command hallucinations.  Patient is a 39 y.o. female presenting with nosebleeds. The history is provided by the patient.  Epistaxis   Past Medical History  Diagnosis Date  . Depression   . Pneumonia   . Cirrhosis   . Portal hypertension   . S/P alcohol detoxification     2-3 days at behavioral health previously  . Alcoholism   . Anxiety   . GERD (gastroesophageal reflux disease)   . 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 Menorrhagia   . Pancytopenia 01/15/2014  . UGI bleed 06/12/2014   Past Surgical History  Procedure Laterality Date  . Cholecystectomy    . Esophagogastroduodenoscopy N/A 06/12/2014    Procedure: ESOPHAGOGASTRODUODENOSCOPY (EGD);  Surgeon: Gatha Mayer, MD;  Location: [facility];  Service: Endoscopy;  Laterality: N/A;  . Esophagogastroduodenoscopy (egd) with propofol N/A 07/29/2014    Procedure: ESOPHAGOGASTRODUODENOSCOPY (EGD) WITH PROPOFOL;  Surgeon: Inda Castle, MD;  Location: [facility] END[facility]COPY;  Service: Endoscopy;   Laterality: N/A;   Family History  Problem Relation Age of Onset  . Colon polyps Mother   . Hypertension Mother   . Thyroid disease Mother   . Alcoholism Mother   . Alcoholism Father   . Alcohol abuse Maternal Grandfather   . Alcohol abuse Paternal Grandfather   . Alcohol abuse Paternal Aunt   . Alcohol abuse Maternal Uncle    History  Substance Use Topics  . Smoking status: Never Smoker   . Smokeless tobacco: Never Used  . Alcohol Use: 42.0 oz/week    70 Glasses of wine per week     Comment: reports 3 bottle of wine and pint of or more of vodka daily. 2 bottles of wine currently and pint of vodka some days for at least 4-5 days.a month.     OB History    Gravida Para Term Preterm AB TAB SAB Ectopic Multiple Living            2     Review of Systems  HENT: Positive for nosebleeds.   All other systems reviewed and are negative.     Allergies  Morphine and related; Morphine and related; and Nsaids  Home Medications   Prior to Admission medications   Medication Sig Start Date End Date Taking? Authorizing Provider  folic acid (FOLVITE) 1 MG tablet Take 1 mg by mouth daily.    Historical Provider, MD  Multiple Vitamin (MULTIVITAMIN WITH MINERALS) TABS tablet Take 1 tablet by mouth daily. For low vitamin 10/04/14   Kennedy Bucker, NP  nadolol (CORGARD) 40 MG tablet Take 1 tablet (40 mg total)  by mouth daily. 11/06/14   Nishant Dhungel, MD  pantoprazole (PROTONIX) 40 MG tablet Take 1 tablet (40 mg total) by mouth daily. 11/06/14   Nishant Dhungel, MD   BP 130/71 mmHg  Pulse 96  Temp(Src) 98 F (36.7 C) (Oral)  Resp 16  SpO2 95%  LMP 08/20/2014 Physical Exam  Constitutional: She is oriented to person, place, and time. She appears well-developed and well-nourished.  Non-toxic appearance. No distress.  HENT:  Head: Normocephalic and atraumatic.  Dry blood noted in both nares without active bleeding  Eyes: Conjunctivae, EOM and lids are normal. Pupils are equal, round,  and reactive to light.  Neck: Normal range of motion. Neck supple. No tracheal deviation present. No thyroid mass present.  Cardiovascular: Normal rate, regular rhythm and normal heart sounds.  Exam reveals no gallop.   No murmur heard. Pulmonary/Chest: Effort normal and breath sounds normal. No stridor. No respiratory distress. She has no decreased breath sounds. She has no wheezes. She has no rhonchi. She has no rales.  Abdominal: Soft. Normal appearance and bowel sounds are normal. She exhibits no distension. There is no tenderness. There is no rebound and no CVA tenderness.  Musculoskeletal: Normal range of motion. She exhibits no edema or tenderness.  Neurological: She is alert and oriented to person, place, and time. She has normal strength. No cranial nerve deficit or sensory deficit. GCS eye subscore is 4. GCS verbal subscore is 5. GCS motor subscore is 6.  Skin: Skin is warm and dry. No abrasion and no rash noted.  Psychiatric: She has a normal mood and affect. Her speech is normal and behavior is normal. She expresses no suicidal plans and no homicidal plans.  Nursing note and vitals reviewed.   ED Course  Procedures (including critical care time) Labs Review Labs Reviewed  CBC WITH DIFFERENTIAL/PLATELET  COMPREHENSIVE METABOLIC PANEL  ETHANOL  URINE RAPID DRUG SCREEN (HOSP PERFORMED)  PROTIME-INR  APTT  LIPASE, BLOOD  TYPE AND SCREEN    Imaging Review No results found.   EKG Interpretation None      MDM   Final diagnoses:  None    Pt without evidence of GI bleeding, medically cleared for disposition by [facility]    Leota Jacobsen, MD 12/26/14 1032

## 2014-12-20 NOTE — ED Notes (Signed)
Patient is made aware that an urine sample is needed. Patient states that she is unable to void at this time. Patient is encouraged to void when able.
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
## 2014-12-20 NOTE — ED Notes (Signed)
Patient reports that she has been having nosebleeds lasting a long time with clots and perfuse bleeding. Patient is also requesting detox from alcohol. Patient states she last drank at 1130 this AM. Patient states she drank 1/2 bottle wine at that time. Patient denies Si/HI or visual hallucinations. Patient states she is hearing voices and feels like she is dreaming.

## 2014-12-21 DIAGNOSIS — F10239 Alcohol dependence with withdrawal, unspecified: Secondary | ICD-10-CM

## 2014-12-21 DIAGNOSIS — F1020 Alcohol dependence, uncomplicated: Secondary | ICD-10-CM | POA: Diagnosis present

## 2014-12-21 LAB — RAPID URINE DRUG SCREEN, HOSP PERFORMED
AMPHETAMINES: NOT DETECTED
BARBITURATES: NOT DETECTED
Benzodiazepines: POSITIVE — AB
Cocaine: NOT DETECTED
Opiates: NOT DETECTED
TETRAHYDROCANNABINOL: NOT DETECTED

## 2014-12-21 MED ORDER — LORAZEPAM 1 MG PO TABS: 0.0000 mg | ORAL_TABLET | Freq: Two times a day (BID) | ORAL | Status: DC

## 2014-12-21 MED ORDER — LORAZEPAM 1 MG PO TABS
1.0000 mg | ORAL_TABLET | Freq: Once | ORAL | Status: AC
  Administered 2014-12-21: 1 mg via ORAL
  Filled 2014-12-21: qty 1

## 2014-12-21 MED ORDER — FOLIC ACID 1 MG PO TABS
1.0000 mg | ORAL_TABLET | Freq: Every day | ORAL | Status: DC
  Administered 2014-12-21: 1 mg via ORAL
  Filled 2014-12-21: qty 1

## 2014-12-21 MED ORDER — LORAZEPAM 1 MG PO TABS
0.0000 mg | ORAL_TABLET | Freq: Four times a day (QID) | ORAL | Status: DC
  Administered 2014-12-21 (×2): 1 mg via ORAL
  Filled 2014-12-21 (×2): qty 1

## 2014-12-21 MED ORDER — FOLIC ACID 1 MG PO TABS: 1.0000 mg | ORAL_TABLET | Freq: Every day | ORAL | Status: DC

## 2014-12-21 MED ORDER — ADULT MULTIVITAMIN W/MINERALS CH
1.0000 | ORAL_TABLET | Freq: Every day | ORAL | Status: DC
  Administered 2014-12-21: 1 via ORAL
  Filled 2014-12-21: qty 1

## 2014-12-21 MED ORDER — NADOLOL 40 MG PO TABS
40.0000 mg | ORAL_TABLET | Freq: Every day | ORAL | Status: DC
  Filled 2014-12-21: qty 1

## 2014-12-21 MED ORDER — LORAZEPAM 1 MG PO TABS: 1.0000 mg | ORAL_TABLET | Freq: Four times a day (QID) | ORAL | Status: DC | PRN

## 2014-12-21 MED ORDER — LORAZEPAM 2 MG/ML IJ SOLN: 1.0000 mg | Freq: Four times a day (QID) | INTRAMUSCULAR | Status: DC | PRN

## 2014-12-21 MED ORDER — VITAMIN B-1 100 MG PO TABS
100.0000 mg | ORAL_TABLET | Freq: Every day | ORAL | Status: DC
  Administered 2014-12-21: 100 mg via ORAL
  Filled 2014-12-21: qty 1

## 2014-12-21 MED ORDER — THIAMINE HCL 100 MG/ML IJ SOLN: 100.0000 mg | Freq: Every day | INTRAMUSCULAR | Status: DC

## 2014-12-21 NOTE — ED Notes (Signed)
Pt up to the bathroom and nose started bleeding again.   Pt back to room and instructed to hold pressure and not to blow her nose.

## 2014-12-21 NOTE — ED Notes (Signed)
Pt reports that she does not take corgard. NP aware, discontinue Ocie Doyne NP

## 2014-12-21 NOTE — ED Notes (Signed)
Reading in room, feeling better

## 2014-12-21 NOTE — ED Notes (Signed)
Husband at bedside.  

## 2014-12-21 NOTE — ED Notes (Signed)
Report received from Janie Rambo RN. Pt. Alert and oriented in no distress denies SI, HI, AVH and pain.  Pt. Instructed to come to me with problems or concerns.Will continue to monitor for safety via security cameras and Q 15 minute checks. 

## 2014-12-21 NOTE — Progress Notes (Signed)
Per Otila Kluver, [facility] patient accepted to [facility] [id number] and can be transferred after 7pm.  CSW informed nursing staff of the updated disposition.      Chesley Noon, MSW, LCSW, LCAS-A Evening Clinical Social Worker [phone]

## 2014-12-21 NOTE — ED Notes (Signed)
Up to the bathroom, feeling better

## 2014-12-21 NOTE — ED Notes (Addendum)
Sitting on the bed eating, bleeding almost resolved, still w/ some drainage

## 2014-12-21 NOTE — ED Notes (Signed)
Sitting on the bed, PO fluids encouraged, no bleeding noted

## 2014-12-21 NOTE — ED Notes (Signed)
TTS into see 

## 2014-12-21 NOTE — ED Notes (Signed)
Pt has been accepted to [facility] and can transport after 1900 per Otila Kluver AC-room [id number]

## 2014-12-21 NOTE — ED Notes (Signed)
Pt's husband into see 

## 2014-12-21 NOTE — ED Notes (Signed)
Dr C in w/ pt

## 2014-12-21 NOTE — ED Notes (Signed)
On the phone 
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
## 2014-12-22 ENCOUNTER — Inpatient Hospital Stay (HOSPITAL_COMMUNITY)
Admission: AD | Admit: 2014-12-22 | Discharge: 2014-12-25 | DRG: 897 | Disposition: A | Discharge status: Home / Self Care | Payer: Commercial Managed Care - PPO | Source: Intra-hospital | Attending: Psychiatry | Admitting: Psychiatry

## 2014-12-22 ENCOUNTER — Encounter (HOSPITAL_COMMUNITY): Payer: Self-pay | Admitting: *Deleted

## 2014-12-22 DIAGNOSIS — F332 Major depressive disorder, recurrent severe without psychotic features: Secondary | ICD-10-CM | POA: Diagnosis present

## 2014-12-22 DIAGNOSIS — G4700 Insomnia, unspecified: Secondary | ICD-10-CM | POA: Diagnosis present

## 2014-12-22 DIAGNOSIS — K7030 Alcoholic cirrhosis of liver without ascites: Secondary | ICD-10-CM | POA: Diagnosis present

## 2014-12-22 DIAGNOSIS — F10239 Alcohol dependence with withdrawal, unspecified: Secondary | ICD-10-CM | POA: Diagnosis present

## 2014-12-22 DIAGNOSIS — E611 Iron deficiency: Secondary | ICD-10-CM | POA: Diagnosis present

## 2014-12-22 DIAGNOSIS — D649 Anemia, unspecified: Secondary | ICD-10-CM | POA: Diagnosis present

## 2014-12-22 DIAGNOSIS — K219 Gastro-esophageal reflux disease without esophagitis: Secondary | ICD-10-CM | POA: Diagnosis present

## 2014-12-22 DIAGNOSIS — F1020 Alcohol dependence, uncomplicated: Secondary | ICD-10-CM | POA: Diagnosis present

## 2014-12-22 DIAGNOSIS — Y908 Blood alcohol level of 240 mg/100 ml or more: Secondary | ICD-10-CM | POA: Diagnosis present

## 2014-12-22 DIAGNOSIS — F4310 Post-traumatic stress disorder, unspecified: Secondary | ICD-10-CM | POA: Diagnosis present

## 2014-12-22 DIAGNOSIS — Z8249 Family history of ischemic heart disease and other diseases of the circulatory system: Secondary | ICD-10-CM | POA: Diagnosis not present

## 2014-12-22 DIAGNOSIS — K766 Portal hypertension: Secondary | ICD-10-CM | POA: Diagnosis present

## 2014-12-22 DIAGNOSIS — Z811 Family history of alcohol abuse and dependence: Secondary | ICD-10-CM | POA: Diagnosis not present

## 2014-12-22 MED ORDER — VITAMIN B-1 100 MG PO TABS
100.0000 mg | ORAL_TABLET | Freq: Every day | ORAL | Status: DC
  Administered 2014-12-22 – 2014-12-25 (×4): 100 mg via ORAL
  Filled 2014-12-22 (×7): qty 1

## 2014-12-22 MED ORDER — LORAZEPAM 1 MG PO TABS
1.0000 mg | ORAL_TABLET | Freq: Two times a day (BID) | ORAL | Status: AC
  Administered 2014-12-24 (×2): 1 mg via ORAL
  Filled 2014-12-22 (×2): qty 1

## 2014-12-22 MED ORDER — MAGNESIUM HYDROXIDE 400 MG/5ML PO SUSP: 30.0000 mL | Freq: Every day | ORAL | Status: DC | PRN

## 2014-12-22 MED ORDER — ONDANSETRON 4 MG PO TBDP: 4.0000 mg | ORAL_TABLET | Freq: Four times a day (QID) | ORAL | Status: AC | PRN

## 2014-12-22 MED ORDER — SENNOSIDES-DOCUSATE SODIUM 8.6-50 MG PO TABS
1.0000 | ORAL_TABLET | Freq: Two times a day (BID) | ORAL | Status: DC
  Administered 2014-12-22 – 2014-12-25 (×6): 1 via ORAL
  Filled 2014-12-22 (×10): qty 1

## 2014-12-22 MED ORDER — LORAZEPAM 1 MG PO TABS
0.0000 mg | ORAL_TABLET | Freq: Two times a day (BID) | ORAL | Status: DC
Start: 2014-12-23 — End: 2014-12-22

## 2014-12-22 MED ORDER — LOPERAMIDE HCL 2 MG PO CAPS: 2.0000 mg | ORAL_CAPSULE | ORAL | Status: AC | PRN

## 2014-12-22 MED ORDER — LORAZEPAM 1 MG PO TABS
1.0000 mg | ORAL_TABLET | Freq: Every day | ORAL | Status: AC
  Administered 2014-12-25: 1 mg via ORAL
  Filled 2014-12-22: qty 1

## 2014-12-22 MED ORDER — THIAMINE HCL 100 MG/ML IJ SOLN: 100.0000 mg | Freq: Every day | INTRAMUSCULAR | Status: DC

## 2014-12-22 MED ORDER — LORAZEPAM 1 MG PO TABS
1.0000 mg | ORAL_TABLET | Freq: Three times a day (TID) | ORAL | Status: AC
  Administered 2014-12-23 (×3): 1 mg via ORAL
  Filled 2014-12-22 (×3): qty 1

## 2014-12-22 MED ORDER — LORAZEPAM 1 MG PO TABS: 1.0000 mg | ORAL_TABLET | Freq: Four times a day (QID) | ORAL | Status: DC | PRN

## 2014-12-22 MED ORDER — THIAMINE HCL 100 MG/ML IJ SOLN: 100.0000 mg | Freq: Once | INTRAMUSCULAR | Status: DC

## 2014-12-22 MED ORDER — PANTOPRAZOLE SODIUM 40 MG PO TBEC
40.0000 mg | DELAYED_RELEASE_TABLET | Freq: Two times a day (BID) | ORAL | Status: DC
  Administered 2014-12-22 – 2014-12-25 (×7): 40 mg via ORAL
  Filled 2014-12-22 (×12): qty 1

## 2014-12-22 MED ORDER — LORAZEPAM 1 MG PO TABS
1.0000 mg | ORAL_TABLET | Freq: Four times a day (QID) | ORAL | Status: AC | PRN
  Administered 2014-12-23 – 2014-12-24 (×2): 1 mg via ORAL
  Filled 2014-12-22 (×2): qty 1

## 2014-12-22 MED ORDER — ALUM & MAG HYDROXIDE-SIMETH 200-200-20 MG/5ML PO SUSP
30.0000 mL | ORAL | Status: DC | PRN
  Administered 2014-12-22: 30 mL via ORAL
  Filled 2014-12-22: qty 30

## 2014-12-22 MED ORDER — TRAZODONE HCL 50 MG PO TABS
50.0000 mg | ORAL_TABLET | Freq: Every evening | ORAL | Status: DC | PRN
  Administered 2014-12-22 – 2014-12-24 (×4): 50 mg via ORAL
  Filled 2014-12-22: qty 1
  Filled 2014-12-22: qty 28
  Filled 2014-12-22 (×2): qty 1

## 2014-12-22 MED ORDER — LORAZEPAM 1 MG PO TABS: 0.0000 mg | ORAL_TABLET | Freq: Four times a day (QID) | ORAL | Status: DC

## 2014-12-22 MED ORDER — TRAZODONE HCL 50 MG PO TABS
ORAL_TABLET | ORAL | Status: AC
  Filled 2014-12-22: qty 1

## 2014-12-22 MED ORDER — ACETAMINOPHEN 325 MG PO TABS: 650.0000 mg | ORAL_TABLET | Freq: Four times a day (QID) | ORAL | Status: DC | PRN

## 2014-12-22 MED ORDER — ADULT MULTIVITAMIN W/MINERALS CH
1.0000 | ORAL_TABLET | Freq: Every day | ORAL | Status: DC
Start: 2014-12-22 — End: 2014-12-23
  Filled 2014-12-22 (×2): qty 1

## 2014-12-22 MED ORDER — HYDROXYZINE HCL 25 MG PO TABS: 25.0000 mg | ORAL_TABLET | Freq: Four times a day (QID) | ORAL | Status: AC | PRN

## 2014-12-22 MED ORDER — VITAMIN B-1 100 MG PO TABS
100.0000 mg | ORAL_TABLET | Freq: Every day | ORAL | Status: DC
  Filled 2014-12-22 (×2): qty 1

## 2014-12-22 MED ORDER — ADULT MULTIVITAMIN W/MINERALS CH
1.0000 | ORAL_TABLET | Freq: Every day | ORAL | Status: DC
  Administered 2014-12-22 – 2014-12-25 (×4): 1 via ORAL
  Filled 2014-12-22 (×7): qty 1

## 2014-12-22 MED ORDER — LORAZEPAM 1 MG PO TABS
1.0000 mg | ORAL_TABLET | Freq: Four times a day (QID) | ORAL | Status: AC
  Administered 2014-12-22 (×2): 1 mg via ORAL
  Filled 2014-12-22 (×2): qty 1

## 2014-12-22 MED ORDER — ONDANSETRON HCL 4 MG PO TABS: 8.0000 mg | ORAL_TABLET | Freq: Three times a day (TID) | ORAL | Status: DC | PRN

## 2014-12-22 MED ORDER — FOLIC ACID 1 MG PO TABS
1.0000 mg | ORAL_TABLET | Freq: Every day | ORAL | Status: DC
  Administered 2014-12-22 – 2014-12-25 (×4): 1 mg via ORAL
  Filled 2014-12-22: qty 14
  Filled 2014-12-22 (×6): qty 1

## 2014-12-22 MED ORDER — FERROUS SULFATE 325 (65 FE) MG PO TABS
325.0000 mg | ORAL_TABLET | Freq: Two times a day (BID) | ORAL | Status: DC
  Administered 2014-12-22 – 2014-12-23 (×3): 325 mg via ORAL
  Filled 2014-12-22 (×6): qty 1

## 2014-12-22 NOTE — Tx Team (Signed)
Initial Interdisciplinary Treatment Plan   PATIENT STRESSORS: Medication change or noncompliance Substance abuse   PATIENT STRENGTHS: Ability for insight Average or above average intelligence Capable of independent living Motivation for treatment/growth Supportive family/friends   PROBLEM LIST: Problem List/Patient Goals Date to be addressed Date deferred Reason deferred Estimated date of resolution  Anxiety 12/22/14     Substance abuse 12/22/14     " I want to work on my drinking and anxiety 12/22/14                                          DISCHARGE CRITERIA:  Ability to meet basic life and health needs Improved stabilization in mood, thinking, and/or behavior Verbal commitment to aftercare and medication compliance Withdrawal symptoms are absent or subacute and managed without 24-hour nursing intervention  PRELIMINARY DISCHARGE PLAN: Attend aftercare/continuing care group Return to previous living arrangement  PATIENT/FAMIILY INVOLVEMENT: This treatment plan has been presented to and reviewed with the patient, Sara [name], and/or family member, .  The patient and family have been given the opportunity to ask questions and make suggestions.  [place], [place] 12/22/2014, 1:43 AM

## 2014-12-22 NOTE — [facility] Group Notes (Signed)
[facility] LCSW Group Therapy 12/22/2014  1:15 pm  Type of Therapy: Group Therapy Participation Level: Active  Participation Quality: Attentive, Sharing and Supportive  Affect: Appropriate  Cognitive: Alert and Oriented  Insight: Developing/Improving and Engaged  Engagement in Therapy: Developing/Improving and Engaged  Modes of Intervention: Clarification, Confrontation, Discussion, Education, Exploration,  Limit-setting, Orientation, Problem-solving, Rapport Building, Art therapist, Socialization and Support  Summary of Progress/Problems: Pt identified obstacles faced currently and processed barriers involved in overcoming these obstacles. Pt identified steps necessary for overcoming these obstacles and explored motivation (internal and external) for facing these difficulties head on. Pt further identified one area of concern in their lives and chose a goal to focus on for today. Patient identified relapse as an obstacle. Patient discussed that she needs to attend AA meetings, rely on her positive social supports, and engaging in meaningful activities as relapse prevention. CSW and other group members provided emotional support and encouragement.  Tilden Fossa, MSW, [place] Worker [facility] [phone]

## 2014-12-22 NOTE — Clinical Social Work Note (Signed)
CSW made referral to [facility] for residential treatment- file [phone]. [facility] to verify patient's insurance and contact CSW back to discuss possible admission.   Tilden Fossa, MSW, [place] Worker [facility] [phone]

## 2014-12-22 NOTE — [facility] Group Notes (Signed)
   [facility] LCSW Aftercare Discharge Planning Group Note  12/22/2014  8:45 AM   Participation Quality: Alert, Appropriate and Oriented  Mood/Affect: Anxious  Depression Rating: 2  Anxiety Rating: 7  Thoughts of Suicide: Pt denies SI/HI  Will you contract for safety? Yes  Current AVH: Pt denies  Plan for Discharge/Comments: Pt attended discharge planning group and actively participated in group. CSW provided pt with today's workbook. Patient reports feeling "a little foggy" today. She reports being hospitalized due to alcohol abuse, reports that she is drinking 3-4 bottles of wine a day. She has been to [facility] and [facility] previously for residential treatment and is requesting further residential treatment again at discharge.   Transportation Means: Pt reports access to transportation  Supports: No supports mentioned at this time  Tilden Fossa, MSW, [place] Social Worker [employer] [phone]

## 2014-12-22 NOTE — [facility] Suicide Risk Assessment (Signed)
[facility] Admission Suicide Risk Assessment   Nursing information obtained from:    Demographic factors:   39 year old female, married, currently not employed, lives at home with family Current Mental Status:   See below Loss Factors:   difficulty functioning in daily activities related to ETOH dependence Historical Factors:   ETOH Dependence  Risk Reduction Factors:   sense of responsibility to family Total Time spent with patient: 30 minutes Principal Problem: Alcohol Dependence  Diagnosis:   Patient Active Problem List   Diagnosis Date Noted  . Alcohol dependence with withdrawal with complication [R51.884] 16/60/6301  . Alcohol dependence [F10.20] 11/05/2014  . Hematemesis [K92.0] 11/05/2014  . GIB (gastrointestinal bleeding) [K92.2] 11/05/2014  . Pancytopenia [D61.818] 11/05/2014  . Alcohol dependence with alcohol-induced mood disorder [F10.24]   . Esophageal varices [I85.00] 09/12/2014  . Thrombocytopenia [D69.6] 09/12/2014  . Alcohol dependence syndrome [F10.20] 02/01/2014  . Post traumatic stress disorder (PTSD) [F43.10] 02/01/2014  . Pancreatitis [K85.9] 01/15/2014  . Abdominal pain [R10.9] 01/15/2014  . Substance induced mood disorder [F19.94] 09/28/2013  . Anemia [D64.9] 07/01/2013  . Anxiety state [F41.1] 06/30/2013  . 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 Depression [F32.9]   . GERD (gastroesophageal reflux disease) [K21.9]   . Portal hypertension [K76.6]   . Abnormal uterine bleeding [N93.9] 05/31/2013     Continued Clinical Symptoms:  Alcohol Use Disorder Identification Test Final Score (AUDIT): 29 The "Alcohol Use Disorders Identification Test", Guidelines for Use in Primary Care, Second Edition.  World Pharmacologist [facility]). Score between 0-7:  no or low risk or alcohol related problems. Score between 8-15:  moderate risk of alcohol related problems. Score between 16-19:  high risk of alcohol related problems. Score 20 or above:  warrants further  diagnostic evaluation for alcohol dependence and treatment.   CLINICAL FACTORS:  History of Alcohol Dependence- has been drinking heavily and daily over recent weeks to months.    Musculoskeletal: Strength & Muscle Tone: within normal limits- some tremulousness, mild facial flushing, no psychomotor restlessness  Gait & Station: normal Patient leans: N/A  Psychiatric Specialty Exam: Physical Exam  Review of Systems  Constitutional: Negative for fever and chills.  Respiratory: Negative for cough and shortness of breath.   Cardiovascular: Negative for chest pain.  Gastrointestinal: Negative for vomiting, blood in stool and melena.  Genitourinary: Negative for dysuria, urgency and frequency.  Skin: Negative for rash.  Neurological: Negative for seizures and headaches.  Psychiatric/Behavioral: Positive for substance abuse.    Blood pressure 122/65, pulse 96, temperature 99.1 F (37.3 C), temperature source Oral, resp. rate 16, height 5\' 2"  (1.575 m), weight 177 lb (80.287 kg), last menstrual period 08/20/2014.Body mass index is 32.37 kg/(m^2).  General Appearance: Fairly Groomed  Engineer, water::  Good  Speech:  Normal Rate  Volume:  Normal  Mood:  milldy depressed, but affect reactive  Affect:  Appropriate  Thought Process:  Goal Directed and Linear  Orientation:  Full (Time, Place, and Person)  Thought Content:  denies hallucinations, no delusions, does not appear internally preoccupied   Suicidal Thoughts:  No at this time denies any thoughts of hurting self and  contracts for safety on unit   Homicidal Thoughts:  No  Memory:  Recent and Remote grossly intact  Judgement:  Fair  Insight:  Present  Psychomotor Activity:  Tremor- mild   Concentration:  Good  Recall:  Good  Fund of Knowledge:Good  Language: Good  Akathisia:  Negative  Handed:  Right  AIMS (if indicated):  Assets:  Desire for Improvement Housing Social Support  Sleep:  Number of Hours: 4  Cognition: WNL   ADL's:  fair     COGNITIVE FEATURES THAT CONTRIBUTE TO RISK:  Closed-mindedness    SUICIDE RISK:   Moderate:  Frequent suicidal ideation with limited intensity, and duration, some specificity in terms of plans, no associated intent, good self-control, limited dysphoria/symptomatology, some risk factors present, and identifiable protective factors, including available and accessible social support.  PLAN OF CARE: Patient will be admitted to inpatient psychiatric unit for stabilization and safety. Will provide and encourage milieu participation. Provide medication management and maked adjustments as needed. Will also provide medication management to minimize risk of alcohol withdrawal. Will follow daily.    Medical Decision Making:  Review of Psycho-Social Stressors (1), Review or order clinical lab tests (1), Established Problem, Worsening (2) and Review of Medication Regimen & Side Effects (2)  I certify that inpatient services furnished can reasonably be expected to improve the patient's condition.   COBOS, FERNANDO 12/22/2014, 1:37 PM

## 2014-12-22 NOTE — Progress Notes (Signed)
39 year old female pt admitted for alcohol detox. Pt reports that she drinks at least 3 bottles of wine daily and that she drinks because her anxiety is not under control. Pt reports that she has been on klonopin and lexapro in the past and reports that it helped with her anxiety but when she ran out of medications she did not get a refill. Pt denies any SI and is able to contract for safety on the unit. Pt reports that she would like to get into [facility] when she discharges from here. On admission, pt is sweaty and tremulous and appears to be in active withdrawal at this time. Pt was oriented to the unit and safety maintained.

## 2014-12-22 NOTE — H&P (Signed)
Psychiatric Admission Assessment Adult  Patient Identification: Sara [name] MRN:  [id number] Date of Evaluation:  12/22/2014 Chief Complaint:  ANXIETY DISORDER NOS MAJOR DEPRESSION,RECURRENT SEVERE ALCOHOL INTOXICATION WITH MODERATE OR SEVERE USE DISORDER Principal Diagnosis: <principal problem not specified> Diagnosis:   Patient Active Problem List   Diagnosis Date Noted  . Alcohol dependence with withdrawal with complication [N39.767] 34/19/3790  . Alcohol dependence [F10.20] 11/05/2014  . Hematemesis [K92.0] 11/05/2014  . GIB (gastrointestinal bleeding) [K92.2] 11/05/2014  . Pancytopenia [D61.818] 11/05/2014  . Alcohol dependence with alcohol-induced mood disorder [F10.24]   . Esophageal varices [I85.00] 09/12/2014  . Thrombocytopenia [D69.6] 09/12/2014  . Alcohol dependence syndrome [F10.20] 02/01/2014  . Post traumatic stress disorder (PTSD) [F43.10] 02/01/2014  . Pancreatitis [K85.9] 01/15/2014  . Abdominal pain [R10.9] 01/15/2014  . Substance induced mood disorder [F19.94] 09/28/2013  . Anemia [D64.9] 07/01/2013  . Anxiety state [F41.1] 06/30/2013  . 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 Depression [F32.9]   . GERD (gastroesophageal reflux disease) [K21.9]   . Portal hypertension [K76.6]   . Abnormal uterine bleeding [N93.9] 05/31/2013   History of Present Illness: Sara [name] is a 39 year old female. Admitted from the [facility] because of alcoholism. She reports, "I have been drinking heavily x 6 weeks (a month & half), 2-3 bottles of wine daily. I realized that my drinking is getting out of hand, I decided to seek help. I'm tired of drinking too much. I'm not depressed, rather saddened at a time when I left [place] . I'm no longer sad. I have no hallucinations or psychosis. I'm not suicidal. I have been abusing alcohol off & on x many years. My longest sobriety was 65 days. I had been to a rehab center in the past. I have not experienced withdrawal  seizures and or black-outs. I have had no DUIs. No drug use or abuse, no weed. I had used Klonopin in the past due to my high anxiety. I have no psychiatrist, no suicide attempts. I had depression during my teen years. I was tried on Zoloft, Lexapro, Prozac. I don't smoke. I'm interested in a rehab treatment after discharge"  Elements:  Location:  Alcohol dependence. Quality:  Guilt, Insomnia, high anxiety, excessive drinking. Severity:  Severe, "drinking 2-3 bottles of wine daily x 6 weeks". Timing:  Been drinking heavily x a month & 1/2. Duration:  Chronic. Context:  "I drink excessively because of high anxiety, low self esteem, I solate myself from others because I drink too much".  Associated Signs/Symptoms:  Depression Symptoms:  depressed mood, insomnia, feelings of worthlessness/guilt,  (Hypo) Manic Symptoms:  Denies   Anxiety Symptoms:  Excessive Worry,  Psychotic Symptoms:  Denies  PTSD Symptoms: NA  Total Time spent with patient: 1 hour  Past Medical History:  Past Medical History  Diagnosis Date  . Depression   . Pneumonia   . Cirrhosis   . Portal hypertension   . S/P alcohol detoxification     2-3 days at behavioral health previously  . Alcoholism   . Anxiety   . GERD (gastroesophageal reflux disease)   . 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 Menorrhagia   . Pancytopenia 01/15/2014  . UGI bleed 06/12/2014    Past Surgical History  Procedure Laterality Date  . Cholecystectomy    . Esophagogastroduodenoscopy N/A 06/12/2014    Procedure: ESOPHAGOGASTRODUODENOSCOPY (EGD);  Surgeon: Gatha Mayer, MD;  Location: [facility];  Service: Endoscopy;  Laterality: N/A;  . Esophagogastroduodenoscopy (egd) with propofol N/A 07/29/2014  Procedure: ESOPHAGOGASTRODUODENOSCOPY (EGD) WITH PROPOFOL;  Surgeon: Inda Castle, MD;  Location: [facility];  Service: Endoscopy;  Laterality: N/A;   Family History:  Family History  Problem Relation Age of Onset   . Colon polyps Mother   . Hypertension Mother   . Thyroid disease Mother   . Alcoholism Mother   . Alcoholism Father   . Alcohol abuse Maternal Grandfather   . Alcohol abuse Paternal Grandfather   . Alcohol abuse Paternal Aunt   . Alcohol abuse Maternal Uncle    Social History:  History  Alcohol Use  . 42.0 oz/week  . 70 Glasses of wine per week    Comment: reports 3 bottle of wine and pint of or more of vodka daily. 2 bottles of wine currently and pint of vodka some days for at least 4-5 days.a month.       History  Drug Use No    Comment: 11/05/14 - pt states she did this approx. March 2014    History   Social History  . Marital Status: Married    Spouse Name: N/A    Number of Children: 2  . Years of Education: N/A   Occupational History  . Paralegal    Social History Main Topics  . Smoking status: Never Smoker   . Smokeless tobacco: Never Used  . Alcohol Use: 42.0 oz/week    70 Glasses of wine per week     Comment: reports 3 bottle of wine and pint of or more of vodka daily. 2 bottles of wine currently and pint of vodka some days for at least 4-5 days.a month.    . Drug Use: No     Comment: 11/05/14 - pt states she did this approx. March 2014  . Sexual Activity: None   Other Topics Concern  . None   Social History Narrative   Lives with husband and 2 children. 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   Additional Social History:  Musculoskeletal: Strength & Muscle Tone: within normal limits Gait & Station: normal Patient leans: N/A  Psychiatric Specialty Exam: Physical Exam  Constitutional: She is oriented to person, place, and time. She appears well-developed.  HENT:  Head: Normocephalic.  Eyes: Pupils are equal, round, and reactive to light.  Neck: Normal range of motion.  Cardiovascular: Normal rate.   Respiratory: Effort normal.  GI: Soft.  Genitourinary:  Denies any issues  Musculoskeletal: Normal range of motion.   Neurological: She is alert and oriented to person, place, and time.  Skin: Skin is warm and dry.  Psychiatric: Her speech is normal and behavior is normal. Thought content normal. Her mood appears anxious. Her affect is not angry, not blunt, not labile and not inappropriate. Cognition and memory are normal. She expresses impulsivity. She exhibits a depressed mood.    Review of Systems  Constitutional: Positive for chills and diaphoresis.  HENT: Negative.   Eyes: Negative.   Respiratory: Negative.   Cardiovascular: Negative.   Gastrointestinal: Negative.   Genitourinary: Negative.   Musculoskeletal: Negative.   Skin: Negative.   Neurological: Positive for tremors.  Endo/Heme/Allergies: Negative.   Psychiatric/Behavioral: Positive for depression and substance abuse (Alcoholism, chronic). Negative for suicidal ideas, hallucinations and memory loss. The patient is nervous/anxious and has insomnia.     Blood pressure 116/82, pulse 86, temperature 99.1 F (37.3 C), temperature source Oral, resp. rate 16, height '5\' 2"'  (1.575 m), weight 80.287 kg (177 lb), last menstrual period 08/20/2014.Body mass index is  32.37 kg/(m^2).  General Appearance: Casual  Eye Contact::  Good  Speech:  Clear and Coherent  Volume:  Normal  Mood:  Anxious, Depressed and guilt  Affect:  Congruent and Flat  Thought Process:  Coherent and Intact  Orientation:  Full (Time, Place, and Person)  Thought Content:  Denies any hallucinations and or psychosis  Suicidal Thoughts:  No  Homicidal Thoughts:  No  Memory:  Immediate;   Good Recent;   Good Remote;   Good  Judgement:  Fair  Insight:  Present  Psychomotor Activity:  Mild tremors  Concentration:  Good  Recall:  Good  Fund of Knowledge:Fair  Language: Fair  Akathisia:  No  Handed:  Right  AIMS (if indicated):     Assets:  Desire for Improvement  ADL's:  Intact  Cognition: WNL  Sleep:  Number of Hours: 4   Risk to Self: Is patient at risk for suicide?:  No  Risk to Others: No   Prior Inpatient Therapy: No  Prior Outpatient Therapy: Yes  Alcohol Screening: 1. How often do you have a drink containing alcohol?: 4 or more times a week 2. How many drinks containing alcohol do you have on a typical day when you are drinking?: 10 or more 3. How often do you have six or more drinks on one occasion?: Daily or almost daily Preliminary Score: 8 4. How often during the last year have you found that you were not able to stop drinking once you had started?: Weekly 5. How often during the last year have you failed to do what was normally expected from you becasue of drinking?: Less than monthly 6. How often during the last year have you needed a first drink in the morning to get yourself going after a heavy drinking session?: Daily or almost daily 7. How often during the last year have you had a feeling of guilt of remorse after drinking?: Weekly 8. How often during the last year have you been unable to remember what happened the night before because you had been drinking?: Never 9. Have you or someone else been injured as a result of your drinking?: Yes, but not in the last year 10. Has a relative or friend or a doctor or another health worker been concerned about your drinking or suggested you cut down?: Yes, during the last year Alcohol Use Disorder Identification Test Final Score (AUDIT): 29 Brief Intervention: Yes  Allergies:   Allergies  Allergen Reactions  . Morphine And Related Other (See Comments)    Slowed HR, lowered BP  . Morphine And Related Other (See Comments)    hypotension  . Nsaids Other (See Comments)    bleeding   Lab Results:  Results for orders placed or performed during the hospital encounter of 12/20/14 (from the past 48 hour(s))  Type and screen     Status: None   Collection Time: 12/20/14  5:11 PM  Result Value Ref Range   ABO/RH(D) A POS    Antibody Screen NEG    Sample Expiration 12/23/2014   CBC with  Differential/Platelet     Status: Abnormal   Collection Time: 12/20/14  5:11 PM  Result Value Ref Range   WBC 5.4 4.0 - 10.5 K/uL   RBC 3.48 (L) 3.87 - 5.11 MIL/uL   Hemoglobin 9.7 (L) 12.0 - 15.0 g/dL   HCT 31.5 (L) 36.0 - 46.0 %   MCV 90.5 78.0 - 100.0 fL   MCH 27.9 26.0 - 34.0 pg  MCHC 30.8 30.0 - 36.0 g/dL   RDW 20.0 (H) 11.5 - 15.5 %   Platelets 73 (L) 150 - 400 K/uL    Comment: REPEATED TO VERIFY SPECIMEN CHECKED FOR CLOTS PLATELET COUNT CONFIRMED BY SMEAR    Neutrophils Relative % 33 (L) 43 - 77 %   Lymphocytes Relative 62 (H) 12 - 46 %   Monocytes Relative 4 3 - 12 %   Eosinophils Relative 1 0 - 5 %   Basophils Relative 0 0 - 1 %   Neutro Abs 1.8 1.7 - 7.7 K/uL   Lymphs Abs 3.3 0.7 - 4.0 K/uL   Monocytes Absolute 0.2 0.1 - 1.0 K/uL   Eosinophils Absolute 0.1 0.0 - 0.7 K/uL   Basophils Absolute 0.0 0.0 - 0.1 K/uL   Smear Review MORPHOLOGY UNREMARKABLE   Comprehensive metabolic panel     Status: Abnormal   Collection Time: 12/20/14  5:11 PM  Result Value Ref Range   Sodium 143 135 - 145 mmol/L   Potassium 3.8 3.5 - 5.1 mmol/L   Chloride 108 96 - 112 mmol/L   CO2 24 19 - 32 mmol/L   Glucose, Bld 111 (H) 70 - 99 mg/dL   BUN <5 (L) 6 - 23 mg/dL   Creatinine, Ser 0.39 (L) 0.50 - 1.10 mg/dL   Calcium 8.6 8.4 - 10.5 mg/dL   Total Protein 8.1 6.0 - 8.3 g/dL   Albumin 3.5 3.5 - 5.2 g/dL   AST 127 (H) 0 - 37 U/L   ALT 39 (H) 0 - 35 U/L   Alkaline Phosphatase 105 39 - 117 U/L   Total Bilirubin 2.0 (H) 0.3 - 1.2 mg/dL   GFR calc non Af Amer >90 >90 mL/min   GFR calc Af Amer >90 >90 mL/min    Comment: (NOTE) The eGFR has been calculated using the CKD EPI equation. This calculation has not been validated in all clinical situations. eGFR's persistently <90 mL/min signify possible Chronic Kidney Disease.    Anion gap 11 5 - 15  Ethanol     Status: Abnormal   Collection Time: 12/20/14  5:11 PM  Result Value Ref Range   Alcohol, Ethyl (B) 420 (HH) 0 - 9 mg/dL     Comment: CRITICAL RESULT CALLED TO, READ BACK BY AND VERIFIED WITH: BINGHAM,A AT 5:55PM ON 12/20/14 BY FESTERMAN,C        LOWEST DETECTABLE LIMIT FOR SERUM ALCOHOL IS 11 mg/dL FOR MEDICAL PURPOSES ONLY   Protime-INR     Status: Abnormal   Collection Time: 12/20/14  5:11 PM  Result Value Ref Range   Prothrombin Time 16.2 (H) 11.6 - 15.2 seconds   INR 1.29 0.00 - 1.49  APTT     Status: Abnormal   Collection Time: 12/20/14  5:11 PM  Result Value Ref Range   aPTT 42 (H) 24 - 37 seconds    Comment:        IF BASELINE aPTT IS ELEVATED, SUGGEST PATIENT RISK ASSESSMENT BE USED TO DETERMINE APPROPRIATE ANTICOAGULANT THERAPY.   Lipase, blood     Status: None   Collection Time: 12/20/14  5:11 PM  Result Value Ref Range   Lipase 58 11 - 59 U/L  Urine rapid drug screen (hosp performed)     Status: Abnormal   Collection Time: 12/21/14  6:41 AM  Result Value Ref Range   Opiates NONE DETECTED NONE DETECTED   Cocaine NONE DETECTED NONE DETECTED   Benzodiazepines POSITIVE (A) NONE DETECTED   Amphetamines  NONE DETECTED NONE DETECTED   Tetrahydrocannabinol NONE DETECTED NONE DETECTED   Barbiturates NONE DETECTED NONE DETECTED    Comment:        DRUG SCREEN FOR MEDICAL PURPOSES ONLY.  IF CONFIRMATION IS NEEDED FOR ANY PURPOSE, NOTIFY LAB WITHIN 5 DAYS.        LOWEST DETECTABLE LIMITS FOR URINE DRUG SCREEN Drug Class       Cutoff (ng/mL) Amphetamine      1000 Barbiturate      200 Benzodiazepine   945 Tricyclics       038 Opiates          300 Cocaine          300 THC              50    Current Medications: Current Facility-Administered Medications  Medication Dose Route Frequency Provider Last Rate Last Dose  . acetaminophen (TYLENOL) tablet 650 mg  650 mg Oral Q6H PRN Waylan Boga, NP      . alum & mag hydroxide-simeth (MAALOX/MYLANTA) 200-200-20 MG/5ML suspension 30 mL  30 mL Oral Q4H PRN Waylan Boga, NP      . folic acid (FOLVITE) tablet 1 mg  1 mg Oral Daily Waylan Boga, NP    1 mg at 12/22/14 0746  . LORazepam (ATIVAN) tablet 0-4 mg  0-4 mg Oral Q6H Waylan Boga, NP   0 mg at 12/22/14 8828   Followed by  . [START ON 12/23/2014] LORazepam (ATIVAN) tablet 0-4 mg  0-4 mg Oral Q12H Waylan Boga, NP      . LORazepam (ATIVAN) tablet 1 mg  1 mg Oral Q6H PRN Waylan Boga, NP      . magnesium hydroxide (MILK OF MAGNESIA) suspension 30 mL  30 mL Oral Daily PRN Waylan Boga, NP      . multivitamin with minerals tablet 1 tablet  1 tablet Oral Daily Waylan Boga, NP   1 tablet at 12/22/14 0746  . ondansetron (ZOFRAN) tablet 8 mg  8 mg Oral Q8H PRN Waylan Boga, NP      . pantoprazole (PROTONIX) EC tablet 40 mg  40 mg Oral BID AC Waylan Boga, NP   40 mg at 12/22/14 0700  . thiamine (VITAMIN B-1) tablet 100 mg  100 mg Oral Daily Waylan Boga, NP   100 mg at 12/22/14 0034   Or  . thiamine (B-1) injection 100 mg  100 mg Intravenous Daily Waylan Boga, NP      . traZODone (DESYREL) tablet 50 mg  50 mg Oral QHS PRN,MR X 1 Evanna Cori Burkett, NP   50 mg at 12/22/14 0115   PTA Medications: Prescriptions prior to admission  Medication Sig Dispense Refill Last Dose  . folic acid (FOLVITE) 1 MG tablet Take 1 mg by mouth daily.   12/19/2014 at Unknown time  . Multiple Vitamin (MULTIVITAMIN WITH MINERALS) TABS tablet Take 1 tablet by mouth daily. For low vitamin   12/19/2014 at Unknown time  . nadolol (CORGARD) 40 MG tablet Take 1 tablet (40 mg total) by mouth daily. (Patient not taking: Reported on 12/20/2014) 30 tablet 2 Completed Course at Unknown time  . pantoprazole (PROTONIX) 40 MG tablet Take 1 tablet (40 mg total) by mouth daily. 30 tablet 2 12/19/2014 at Unknown time   Previous Psychotropic Medications: No   Substance Abuse History in the last 12 months:  Yes.    Consequences of Substance Abuse: Medical Consequences:  Liver damage, Possible death by overdose Legal Consequences:  Arrests, jail time, Loss of driving privilege. Family Consequences:  Family discord, divorce and or  separation.  Results for orders placed or performed during the hospital encounter of 12/20/14 (from the past 72 hour(s))  Type and screen     Status: None   Collection Time: 12/20/14  5:11 PM  Result Value Ref Range   ABO/RH(D) A POS    Antibody Screen NEG    Sample Expiration 12/23/2014   CBC with Differential/Platelet     Status: Abnormal   Collection Time: 12/20/14  5:11 PM  Result Value Ref Range   WBC 5.4 4.0 - 10.5 K/uL   RBC 3.48 (L) 3.87 - 5.11 MIL/uL   Hemoglobin 9.7 (L) 12.0 - 15.0 g/dL   HCT 31.5 (L) 36.0 - 46.0 %   MCV 90.5 78.0 - 100.0 fL   MCH 27.9 26.0 - 34.0 pg   MCHC 30.8 30.0 - 36.0 g/dL   RDW 20.0 (H) 11.5 - 15.5 %   Platelets 73 (L) 150 - 400 K/uL    Comment: REPEATED TO VERIFY SPECIMEN CHECKED FOR CLOTS PLATELET COUNT CONFIRMED BY SMEAR    Neutrophils Relative % 33 (L) 43 - 77 %   Lymphocytes Relative 62 (H) 12 - 46 %   Monocytes Relative 4 3 - 12 %   Eosinophils Relative 1 0 - 5 %   Basophils Relative 0 0 - 1 %   Neutro Abs 1.8 1.7 - 7.7 K/uL   Lymphs Abs 3.3 0.7 - 4.0 K/uL   Monocytes Absolute 0.2 0.1 - 1.0 K/uL   Eosinophils Absolute 0.1 0.0 - 0.7 K/uL   Basophils Absolute 0.0 0.0 - 0.1 K/uL   Smear Review MORPHOLOGY UNREMARKABLE   Comprehensive metabolic panel     Status: Abnormal   Collection Time: 12/20/14  5:11 PM  Result Value Ref Range   Sodium 143 135 - 145 mmol/L   Potassium 3.8 3.5 - 5.1 mmol/L   Chloride 108 96 - 112 mmol/L   CO2 24 19 - 32 mmol/L   Glucose, Bld 111 (H) 70 - 99 mg/dL   BUN <5 (L) 6 - 23 mg/dL   Creatinine, Ser 0.39 (L) 0.50 - 1.10 mg/dL   Calcium 8.6 8.4 - 10.5 mg/dL   Total Protein 8.1 6.0 - 8.3 g/dL   Albumin 3.5 3.5 - 5.2 g/dL   AST 127 (H) 0 - 37 U/L   ALT 39 (H) 0 - 35 U/L   Alkaline Phosphatase 105 39 - 117 U/L   Total Bilirubin 2.0 (H) 0.3 - 1.2 mg/dL   GFR calc non Af Amer >90 >90 mL/min   GFR calc Af Amer >90 >90 mL/min    Comment: (NOTE) The eGFR has been calculated using the CKD EPI equation. This  calculation has not been validated in all clinical situations. eGFR's persistently <90 mL/min signify possible Chronic Kidney Disease.    Anion gap 11 5 - 15  Ethanol     Status: Abnormal   Collection Time: 12/20/14  5:11 PM  Result Value Ref Range   Alcohol, Ethyl (B) 420 (HH) 0 - 9 mg/dL    Comment: CRITICAL RESULT CALLED TO, READ BACK BY AND VERIFIED WITH: BINGHAM,A AT 5:55PM ON 12/20/14 BY FESTERMAN,C        LOWEST DETECTABLE LIMIT FOR SERUM ALCOHOL IS 11 mg/dL FOR MEDICAL PURPOSES ONLY   Protime-INR     Status: Abnormal   Collection Time: 12/20/14  5:11 PM  Result Value Ref Range   Prothrombin  Time 16.2 (H) 11.6 - 15.2 seconds   INR 1.29 0.00 - 1.49  APTT     Status: Abnormal   Collection Time: 12/20/14  5:11 PM  Result Value Ref Range   aPTT 42 (H) 24 - 37 seconds    Comment:        IF BASELINE aPTT IS ELEVATED, SUGGEST PATIENT RISK ASSESSMENT BE USED TO DETERMINE APPROPRIATE ANTICOAGULANT THERAPY.   Lipase, blood     Status: None   Collection Time: 12/20/14  5:11 PM  Result Value Ref Range   Lipase 58 11 - 59 U/L  Urine rapid drug screen (hosp performed)     Status: Abnormal   Collection Time: 12/21/14  6:41 AM  Result Value Ref Range   Opiates NONE DETECTED NONE DETECTED   Cocaine NONE DETECTED NONE DETECTED   Benzodiazepines POSITIVE (A) NONE DETECTED   Amphetamines NONE DETECTED NONE DETECTED   Tetrahydrocannabinol NONE DETECTED NONE DETECTED   Barbiturates NONE DETECTED NONE DETECTED    Comment:        DRUG SCREEN FOR MEDICAL PURPOSES ONLY.  IF CONFIRMATION IS NEEDED FOR ANY PURPOSE, NOTIFY LAB WITHIN 5 DAYS.        LOWEST DETECTABLE LIMITS FOR URINE DRUG SCREEN Drug Class       Cutoff (ng/mL) Amphetamine      1000 Barbiturate      200 Benzodiazepine   629 Tricyclics       528 Opiates          300 Cocaine          300 THC              50     Observation Level/Precautions:  15 minute checks  Laboratory:  Per ED  Psychotherapy: Group sessions,  AA/NA meetings   Medications: See medication lists   Consultations: As needed   Discharge Concerns:  Maintaining sobriety  Estimated LOS: 2-4 days  Other:     Psychological Evaluations: Yes   Treatment Plan Summary: Daily contact with patient to assess and evaluate symptoms and progress in treatment and Medication management: Treatment Plan/Recommendations: 1. Admit for crisis management and stabilization, estimated length of stay 3-5 days.  2. Medication management to reduce current symptoms to base line and improve the patient's overall level of functioning; continue Ativan detox regimen.  3. Treat health problems as indicated.  4. Develop treatment plan to decrease risk of relapse upon discharge and the need for readmission.  5. Psycho-social education regarding relapse prevention and self care.  6. Health care follow up as needed for medical problems.  7. Review, reconcile, and reinstate any pertinent home medications for other health issues where appropriate. 8. Call for consults with hospitalist for any additional specialty patient care services as needed.  Medical Decision Making:  New problem, with additional work up planned, Review of Psycho-Social Stressors (1), Review or order clinical lab tests (1), Review and summation of old records (2), Review of Medication Regimen & Side Effects (2) and Review of New Medication or Change in Dosage (2)  I certify that inpatient services furnished can reasonably be expected to improve the patient's condition.   Sara [name], PMHNP-BC 2/1/201612:31 PM   I have discussed patient case as above with NP and have met with patient. Agree with NP's Note, Assessment, Plan. Patient is a 39 year old married female, long history of alcohol dependence. She has been drinking daily and heavily, up to two bottles of wine per day.  States she is tired of drinking and wants help to stop. At this time vitals are stable, but does present with some tremors,  slight facial flushing. She is not endorsing any history of withdrawal seizures. She reports mild depression and anxiety, but no SI. No psychotic symptoms. Of note, she is chronically anemic.  Will manage with standing CIWA detox protocol.

## 2014-12-22 NOTE — [facility] Group Notes (Signed)
Adult Psychoeducational Group Note  Date:  12/22/2014 Time:  10:06 PM  Group Topic/Focus:  Wrap-Up Group:   The focus of this group is to help patients review their daily goal of treatment and discuss progress on daily workbooks.  Participation Level:  Minimal  Participation Quality:  Appropriate  Affect:  Appropriate  Cognitive:  Appropriate  Insight: Good  Engagement in Group:  Limited  Modes of Intervention:  Discussion  Additional Comments:  Sara [name] stated her day was all right.  She was tired and nauseous.  Her goal was to eat because she expressed she hadn't been eating much.  She has no discharge plans yet but is looking forward to going to a 30 day placement and seeing her kids.  [name] A 12/22/2014, 10:06 PM

## 2014-12-22 NOTE — Progress Notes (Signed)
Patient ID: Sara [name], female   DOB: 05-23-1976, 39 y.o.   MRN: [id number]  DAR: Pt. Denies SI/HI and A/V Hallucinations. Patient reports pain 5/10 and received a heat pack for this. Patient reports she slept poorly last night, appetite is poor, energy level is normal, and concentration is good. Patient reports depression 0/10, hopelessness 3/10, and anxiety is 8/10. Support and encouragement provided to the patient. Scheduled medications administered to patient per physician's orders. Due to patient's level of CIWA she does not require Ativan at this time. Will continue to evaluate withdrawal symptoms. Patient is receptive and cooperative but minimal. Q15 minute checks are maintained for safety.

## 2014-12-22 NOTE — Progress Notes (Signed)
D: client visible on the unit, in dayroom watching TV. Client reports decreases tremors, some nausea and fatigue throughout the day. Goal: "eat more" notes goal met "I ate today". A: Writer introduced self to client provided emotional support, reviewed medications administered as ordered. Staff will monitor q38mn for safety. R: Client currently denies nausea, refused medication for nausea. Client is safe on the unit, attended group.

## 2014-12-23 LAB — IRON AND TIBC
IRON: 314 ug/dL — AB (ref 42–145)
SATURATION RATIOS: 67 % — AB (ref 20–55)
TIBC: 466 ug/dL (ref 250–470)
UIBC: 152 ug/dL (ref 125–400)

## 2014-12-23 LAB — FERRITIN: Ferritin: 52 ng/mL (ref 10–291)

## 2014-12-23 LAB — OCCULT BLOOD X 1 CARD TO LAB, STOOL: Fecal Occult Bld: NEGATIVE

## 2014-12-23 MED ORDER — ACAMPROSATE CALCIUM 333 MG PO TBEC
666.0000 mg | DELAYED_RELEASE_TABLET | Freq: Three times a day (TID) | ORAL | Status: DC
  Administered 2014-12-23 – 2014-12-25 (×7): 666 mg via ORAL
  Filled 2014-12-23 (×4): qty 2
  Filled 2014-12-23: qty 84
  Filled 2014-12-23 (×4): qty 2
  Filled 2014-12-23 (×2): qty 84
  Filled 2014-12-23 (×2): qty 2

## 2014-12-23 MED ORDER — FERROUS SULFATE 325 (65 FE) MG PO TABS
325.0000 mg | ORAL_TABLET | Freq: Every day | ORAL | Status: DC
  Administered 2014-12-24 – 2014-12-25 (×2): 325 mg via ORAL
  Filled 2014-12-23 (×3): qty 1
  Filled 2014-12-23: qty 14

## 2014-12-23 NOTE — Progress Notes (Signed)
Patient ID: Sara [name], female   DOB: [birth year], 39 y.o.   MRN: [id number]  DAR: Pt. Denies SI/HI and A/V Hallucinations to this Probation officer. Patient reports mild withdrawal symptoms and reports she feels fatigued today. Patient does not report any pain or discomfort at this time. Patient gave stool sample and occult test was completed and lab processed this. NP Deloria Lair. Notified of the results. Support and encouragement provided to the patient to come to writer with questions or concerns. Scheduled medications administered to patient per physician's orders. Patient is receptive and cooperative with Probation officer but minimal. Patient is seen in the milieu speaking with peers and staff. Patient is also attending groups. Q15 minute checks are maintained for safety.

## 2014-12-23 NOTE — BHH Group Notes (Signed)
[facility]00 Nursing Group  The focus of this group is to educate the patient on the purpose and policies of crisis stabilization and provide a format to answer questions about their admission.  The group details unit policies and expectations of patients while admitted.  Patient was invited to group but came late. Patient was engaged after arriving to group and had no questions to address in this group.

## 2014-12-23 NOTE — Progress Notes (Signed)
Patient ID: Sara [name], female   DOB: [birth year], 39 y.o.   MRN: [id number] D: Client reports feeling "better" rates depression as "5" of 10, noted decreased tremors. Client discharge plans is to program at [facility] treatment center. Client reports she wants to complete treatment to better self and family, she has an 50 yo daughter and a 9 yo son. Client reports ready to complete program. A: Writer provided emotional support, administered medications as ordered. Staff will monitor q37min for safety. R: Client is safe on the unit, attended group.

## 2014-12-23 NOTE — BHH Suicide Risk Assessment (Signed)
[place] INPATIENT:  Family/Significant Other Suicide Prevention Education  Suicide Prevention Education:  Patient Refusal for Family/Significant Other Suicide Prevention Education: The patient Sara [name] has refused to provide written consent for family/significant other to be provided Family/Significant Other Suicide Prevention Education during admission and/or prior to discharge.  Physician notified. SPE reviewed with patient and brochure provided.  Tema Alire, Casimiro Needle 12/23/2014, 10:10 AM

## 2014-12-23 NOTE — Plan of Care (Signed)
Problem: Alteration in mood & ability to function due to Goal: STG: Patient verbalizes decreases in signs of withdrawal Outcome: Progressing Client report "tremors decreased"

## 2014-12-23 NOTE — Tx Team (Signed)
Interdisciplinary Treatment Plan Update (Adult) Date: 12/23/2014   Time Reviewed: 9:30 AM  Progress in Treatment: Attending groups: Yes Participating in groups: Yes Taking medication as prescribed: Yes Tolerating medication: Yes Family/Significant other contact made: No, CSW continuing to assess for appropriate contacts Patient understands diagnosis: Yes Discussing patient identified problems/goals with staff: Yes Medical problems stabilized or resolved: Yes Denies suicidal/homicidal ideation: Yes Issues/concerns per patient self-inventory: Yes Other:  New problem(s) identified: N/A  Discharge Plan or Barriers:   2/2: Patient requesting residential treatment at discharge. Referral has been made to [facility]. Patient has a telephone interview at 58 am with admissions staff.  Reason for Continuation of Hospitalization:  Depression Anxiety Medication Stabilization   Comments: N/A  Estimated length of stay: 2-3 days  For review of initial/current patient goals, please see plan of care. Patient is a 39 year old female admitted for alcohol detox. She reports experiencing symptoms of depression and anxiety. Patient lives in [place] with her husband and 2 year old child. She has been to previous residential treatment programs in the past and is requesting to go to treatment center at discharge. She identifies her family as supportive. Patient will benefit from crisis stabilization, medication evaluation, group therapy, and psycho education in addition to case management for discharge planning. Patient and CSW reviewed pt's identified goals and treatment plan. Pt verbalized understanding and agreed to treatment plan.   Attendees: Patient:    Family:    Physician: Dr. Parke Poisson; Dr. Sabra Heck 12/23/2014 9:30 AM  Nursing:Beverly Danelle Earthly; Desma Paganini; [place], RN 12/23/2014 9:30 AM  Clinical Social Worker: Erasmo Downer Aloura Matsuoka,  [place] 12/23/2014 9:30 AM  Other: Joette Catching, LCSW  12/23/2014 9:30 AM  Other: Lucinda Dell, Beverly Sessions Liaison 12/23/2014 9:30 AM  Other: Lars Pinks, Case Manager 12/23/2014 9:30 AM  Other: Agustina Caroli, NP 12/23/2014 9:30 AM  Other:  Maxie Better, LCSWA 12/23/2014 9:30 AM  Other:    Other:    Other:    Other:      Scribe for Treatment Team:  Tilden Fossa, MSW, [employer] [phone]

## 2014-12-23 NOTE — Plan of Care (Signed)
Problem: Ineffective individual coping Goal: STG: Patient will remain free from self harm Outcome: Completed/Met Date Met:  12/23/14 Patient remains free from self harm. Goal met.
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
## 2014-12-23 NOTE — Progress Notes (Signed)
[facility] MD Progress Note  12/23/2014 4:42 PM Sara [name]  MRN:  [id number] Subjective: patient states she is feeling better, compared to her admission status. She does state she continues to feel somewhat jittery and shaky, but not to the degree she had been feeling. Denies any medication side effects. Objective: I have discussed case with treatment team and have met with patient. Patient is partially improved compared to initial presentation. Mood seems improved, withj improved range of affect. Less tremulous, not diaphoretic, and presenting calm, not restless or in any acute distress. Of note, patient has had chronic anemia, thrombocytopenia,  and in reviewing prior CBC s ( prior to this admission) , also had decreased WBCs. ( Now improved). Patient states she has been aware of being anemic and states " when I stop drinking it gets better". Of note, she denies any GI bleed or heavy menses. A guaiac was reported negative. She states she is motivated in maintaining sobriety and that she is aware of the negative impact that alcohol has had on her physical health. We discussed medication options to decrease risk of relapse such as naltrexone, campral, antabuse. She expressed interest in Campral. Denies medication side effects. No behavioral problems on unit, visible in groups, day room.  Principal Problem: Alcohol Dependence, Alcohol Withdrawal Diagnosis:   Patient Active Problem List   Diagnosis Date Noted  . Alcohol dependence with withdrawal with complication [Y86.578] 46/96/2952  . Alcohol dependence [F10.20] 11/05/2014  . Hematemesis [K92.0] 11/05/2014  . GIB (gastrointestinal bleeding) [K92.2] 11/05/2014  . Pancytopenia [D61.818] 11/05/2014  . Alcohol dependence with alcohol-induced mood disorder [F10.24]   . Esophageal varices [I85.00] 09/12/2014  . Thrombocytopenia [D69.6] 09/12/2014  . Alcohol dependence syndrome [F10.20] 02/01/2014  . Post traumatic stress disorder (PTSD) [F43.10]  02/01/2014  . Pancreatitis [K85.9] 01/15/2014  . Abdominal pain [R10.9] 01/15/2014  . Substance induced mood disorder [F19.94] 09/28/2013  . Anemia [D64.9] 07/01/2013  . Anxiety state [F41.1] 06/30/2013  . 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 Depression [F32.9]   . GERD (gastroesophageal reflux disease) [K21.9]   . Portal hypertension [K76.6]   . Abnormal uterine bleeding [N93.9] 05/31/2013   Total Time spent with patient: 25 minutes    Past Medical History:  Past Medical History  Diagnosis Date  . Depression   . Pneumonia   . Cirrhosis   . Portal hypertension   . S/P alcohol detoxification     2-3 days at behavioral health previously  . Alcoholism   . Anxiety   . GERD (gastroesophageal reflux disease)   . 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 Menorrhagia   . Pancytopenia 01/15/2014  . UGI bleed 06/12/2014    Past Surgical History  Procedure Laterality Date  . Cholecystectomy    . Esophagogastroduodenoscopy N/A 06/12/2014    Procedure: ESOPHAGOGASTRODUODENOSCOPY (EGD);  Surgeon: Gatha Mayer, MD;  Location: [facility];  Service: Endoscopy;  Laterality: N/A;  . Esophagogastroduodenoscopy (egd) with propofol N/A 07/29/2014    Procedure: ESOPHAGOGASTRODUODENOSCOPY (EGD) WITH PROPOFOL;  Surgeon: Inda Castle, MD;  Location: [facility];  Service: Endoscopy;  Laterality: N/A;   Family History:  Family History  Problem Relation Age of Onset  . Colon polyps Mother   . Hypertension Mother   . Thyroid disease Mother   . Alcoholism Mother   . Alcoholism Father   . Alcohol abuse Maternal Grandfather   . Alcohol abuse Paternal Grandfather   . Alcohol abuse Paternal Aunt   . Alcohol abuse Maternal Uncle  Social History:  History  Alcohol Use  . 42.0 oz/week  . 70 Glasses of wine per week    Comment: reports 3 bottle of wine and pint of or more of vodka daily. 2 bottles of wine currently and pint of vodka some days for at least 4-5 days.a  month.       History  Drug Use No    Comment: 11/05/14 - pt states she did this approx. March 2014    History   Social History  . Marital Status: Married    Spouse Name: N/A    Number of Children: 2  . Years of Education: N/A   Occupational History  . Paralegal    Social History Main Topics  . Smoking status: Never Smoker   . Smokeless tobacco: Never Used  . Alcohol Use: 42.0 oz/week    70 Glasses of wine per week     Comment: reports 3 bottle of wine and pint of or more of vodka daily. 2 bottles of wine currently and pint of vodka some days for at least 4-5 days.a month.    . Drug Use: No     Comment: 11/05/14 - pt states she did this approx. March 2014  . Sexual Activity: None   Other Topics Concern  . None   Social History Narrative   Lives with husband and 2 children. 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   Additional History:    Sleep: improved  Appetite:  Improved    Assessment:   Musculoskeletal: Strength & Muscle Tone: within normal limits- less tremulous  Gait & Station: normal Patient leans: N/A   Psychiatric Specialty Exam: Physical Exam  Review of Systems  Constitutional: Negative for fever and chills.  Respiratory: Negative for cough and shortness of breath.   Cardiovascular: Negative for chest pain.  Gastrointestinal: Negative for vomiting and blood in stool.  Skin: Negative for rash.  Neurological: Positive for headaches.  Psychiatric/Behavioral: Positive for substance abuse. Negative for suicidal ideas.    Blood pressure 111/74, pulse 97, temperature 98.6 F (37 C), temperature source Oral, resp. rate 16, height '5\' 2"'  (1.575 m), weight 177 lb (80.287 kg), last menstrual period 08/20/2014.Body mass index is 32.37 kg/(m^2).  General Appearance: Fairly Groomed  Engineer, water::  Good  Speech:  Normal Rate  Volume:  Normal  Mood:  Mildly depressed, but states mood is better  Affect:  Constricted and but fully reactive    Thought Process:  Goal Directed and Linear  Orientation:  Full (Time, Place, and Person)- no delirium/confusion  Thought Content:  no hallucinations, no delusions  Suicidal Thoughts:  No  Homicidal Thoughts:  No  Memory:  recent and remote grossly intact   Judgement:  Fair  Insight:  Present  Psychomotor Activity:  Normal- not restless or agitated at this time  Concentration:  Good  Recall:  Good  Fund of Knowledge:Good  Language: Good  Akathisia:  Negative  Handed:  Right  AIMS (if indicated):     Assets:  Communication Skills Housing Resilience  ADL's:  Fair   Cognition: WNL  Sleep:  Number of Hours: 6     Current Medications: Current Facility-Administered Medications  Medication Dose Route Frequency Provider Last Rate Last Dose  . acamprosate (CAMPRAL) tablet 666 mg  666 mg Oral TID WC Jenne Campus, MD   666 mg at 12/23/14 1215  . acetaminophen (TYLENOL) tablet 650 mg  650 mg Oral Q6H PRN Waylan Boga, NP      .  alum & mag hydroxide-simeth (MAALOX/MYLANTA) 200-200-20 MG/5ML suspension 30 mL  30 mL Oral Q4H PRN Waylan Boga, NP   30 mL at 12/22/14 1634  . ferrous sulfate tablet 325 mg  325 mg Oral BID WC Encarnacion Slates, NP   325 mg at 12/23/14 0754  . folic acid (FOLVITE) tablet 1 mg  1 mg Oral Daily Waylan Boga, NP   1 mg at 12/23/14 0753  . hydrOXYzine (ATARAX/VISTARIL) tablet 25 mg  25 mg Oral Q6H PRN Encarnacion Slates, NP      . loperamide (IMODIUM) capsule 2-4 mg  2-4 mg Oral PRN Encarnacion Slates, NP      . LORazepam (ATIVAN) tablet 1 mg  1 mg Oral Q6H PRN Encarnacion Slates, NP      . LORazepam (ATIVAN) tablet 1 mg  1 mg Oral TID Encarnacion Slates, NP   1 mg at 12/23/14 1215   Followed by  . [START ON 12/24/2014] LORazepam (ATIVAN) tablet 1 mg  1 mg Oral BID Encarnacion Slates, NP       Followed by  . [START ON 12/25/2014] LORazepam (ATIVAN) tablet 1 mg  1 mg Oral Daily Encarnacion Slates, NP      . magnesium hydroxide (MILK OF MAGNESIA) suspension 30 mL  30 mL Oral Daily PRN Waylan Boga,  NP      . multivitamin with minerals tablet 1 tablet  1 tablet Oral Daily Waylan Boga, NP   1 tablet at 12/23/14 0753  . ondansetron (ZOFRAN) tablet 8 mg  8 mg Oral Q8H PRN Waylan Boga, NP      . ondansetron (ZOFRAN-ODT) disintegrating tablet 4 mg  4 mg Oral Q6H PRN Encarnacion Slates, NP      . pantoprazole (PROTONIX) EC tablet 40 mg  40 mg Oral BID AC Waylan Boga, NP   40 mg at 12/23/14 0630  . senna-docusate (Senokot-S) tablet 1 tablet  1 tablet Oral BID Encarnacion Slates, NP   1 tablet at 12/23/14 0753  . thiamine (VITAMIN B-1) tablet 100 mg  100 mg Oral Daily Waylan Boga, NP   100 mg at 12/23/14 2637   Or  . thiamine (B-1) injection 100 mg  100 mg Intravenous Daily Waylan Boga, NP      . thiamine (B-1) injection 100 mg  100 mg Intramuscular Once Encarnacion Slates, NP   100 mg at 12/22/14 1352  . traZODone (DESYREL) tablet 50 mg  50 mg Oral QHS PRN,MR X 1 Evanna Glenda Chroman, NP   50 mg at 12/22/14 2149    Lab Results:  Results for orders placed or performed during the hospital encounter of 12/22/14 (from the past 48 hour(s))  Iron and TIBC     Status: Abnormal   Collection Time: 12/23/14  6:16 AM  Result Value Ref Range   Iron 314 (H) 42 - 145 ug/dL   TIBC 466 250 - 470 ug/dL   Saturation Ratios 67 (H) 20 - 55 %   UIBC 152 125 - 400 ug/dL    Comment: Performed at [facility]  Ferritin     Status: None   Collection Time: 12/23/14  6:16 AM  Result Value Ref Range   Ferritin 52 10 - 291 ng/mL    Comment: Performed at [facility]  Occult blood card to lab, stool RN will collect     Status: None   Collection Time: 12/23/14 10:52 AM  Result Value Ref Range  Fecal Occult Bld NEGATIVE NEGATIVE    Comment: Performed at [facility]    Physical Findings: AIMS: Facial and Oral Movements Muscles of Facial Expression: None, normal Lips and Perioral Area: None, normal Jaw: None, normal Tongue: None, normal,Extremity Movements Upper (arms, wrists, hands,  fingers): None, normal Lower (legs, knees, ankles, toes): None, normal, Trunk Movements Neck, shoulders, hips: None, normal, Overall Severity Severity of abnormal movements (highest score from questions above): None, normal Incapacitation due to abnormal movements: None, normal Patient's awareness of abnormal movements (rate only patient's report): No Awareness, Dental Status Current problems with teeth and/or dentures?: No Does patient usually wear dentures?: No  CIWA:  CIWA-Ar Total: 2 COWS:      Assessment- at this time patient is partially improved compared to admission. Mood and affect are improved, and symptoms of alcohol withdrawal have abated. Still somewhat tremulous, but no diaphoresis and vitals stable. Tolerating BZD taper well. Of note, chronic anemia, thrombocytopenia, improved WBC- consider alcohol induced .   Treatment Plan Summary: Daily contact with patient to assess and evaluate symptoms and progress in treatment, Medication management, Plan continue inpatient treatment and continue medications as below  Ativan detox protocol Start Campral 666 mgrs TID Patient interested in going to inpatient Rehab after discharge    Medical Decision Making:  Established Problem, Stable/Improving (1), Review of Psycho-Social Stressors (1), Review or order clinical lab tests (1), Review of Medication Regimen & Side Effects (2) and Review of New Medication or Change in Dosage (2)     Khai Arrona 12/23/2014, 4:42 PM

## 2014-12-23 NOTE — BHH Group Notes (Signed)
Adult Psychoeducational Group Note  Date:  12/23/2014 Time:  9:48 PM  Group Topic/Focus:  Wrap-Up Group:   The focus of this group is to help patients review their daily goal of treatment and discuss progress on daily workbooks.  Participation Level:  Minimal  Participation Quality:  Attentive  Affect:  Flat  Cognitive:  Appropriate  Insight: Limited  Engagement in Group:  Limited  Modes of Intervention:  Discussion  Additional Comments:  Rian said her day was good.  She expressed she slept well last night and spoke with her son today.  She is still waiting on discharge plans.  [name] A 12/23/2014, 9:48 PM

## 2014-12-24 NOTE — Plan of Care (Signed)
Problem: Ineffective individual coping Goal: STG-Increase in ability to manage activities of daily living Outcome: Completed/Met Date Met:  12/24/14 Patient able to do ADL's as necessary

## 2014-12-24 NOTE — Progress Notes (Signed)
Pt did attend NA group.

## 2014-12-24 NOTE — Progress Notes (Signed)
D   Pt is appropriate and pleasant   She interacts well with others and is compliant with treatment   Pt reports she is ready for discharge and is looking forward to treatment in [place]   Verbal support given   Medications administered and effectiveness monitored   Q 15 min checks R  Pt safe at present

## 2014-12-24 NOTE — Progress Notes (Signed)
Patient ID: Sara [name], female   DOB: Nov 01, 1976, 39 y.o.   MRN: [id number] [facility] MD Progress Note  12/24/2014 11:23 AM Sara [name]  MRN:  [id number] Subjective: Patient is better than upon admission. She is starting to focus more on discharge. She states she is wanting to go to [facility] in [place].  She is not currently endorsing significant tremors or diaphoresis.. Objective: I have discussed case with treatment team and have met with patient. Although improved compared to admission, she does continue to present with some milder symptoms of withdrawal. Not grossly tremulous or diaphoretic. She continues to present with slight tachycardia but is not hypertensive at present. She is denying any cravings . She was started on Campral yesterday and has had no side effects thus far. She minimizes depression or neuro-vegetative symptoms of depression. She does present slightly constricted in affect. She is visible on unit and going to groups.  Principal Problem: Alcohol Dependence, Alcohol Withdrawal Diagnosis:   Patient Active Problem List   Diagnosis Date Noted  . Alcohol dependence with withdrawal with complication [Z66.063] 01/60/1093  . Alcohol dependence [F10.20] 11/05/2014  . Hematemesis [K92.0] 11/05/2014  . GIB (gastrointestinal bleeding) [K92.2] 11/05/2014  . Pancytopenia [D61.818] 11/05/2014  . Alcohol dependence with alcohol-induced mood disorder [F10.24]   . Esophageal varices [I85.00] 09/12/2014  . Thrombocytopenia [D69.6] 09/12/2014  . Alcohol dependence syndrome [F10.20] 02/01/2014  . Post traumatic stress disorder (PTSD) [F43.10] 02/01/2014  . Pancreatitis [K85.9] 01/15/2014  . Abdominal pain [R10.9] 01/15/2014  . Substance induced mood disorder [F19.94] 09/28/2013  . Anemia [D64.9] 07/01/2013  . Anxiety state [F41.1] 06/30/2013  . 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 Depression [F32.9]   . GERD (gastroesophageal reflux disease) [K21.9]   . Portal  hypertension [K76.6]   . Abnormal uterine bleeding [N93.9] 05/31/2013   Total Time spent with patient: 25 minutes    Past Medical History:  Past Medical History  Diagnosis Date  . Depression   . Pneumonia   . Cirrhosis   . Portal hypertension   . S/P alcohol detoxification     2-3 days at behavioral health previously  . Alcoholism   . Anxiety   . GERD (gastroesophageal reflux disease)   . 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 Menorrhagia   . Pancytopenia 01/15/2014  . UGI bleed 06/12/2014    Past Surgical History  Procedure Laterality Date  . Cholecystectomy    . Esophagogastroduodenoscopy N/A 06/12/2014    Procedure: ESOPHAGOGASTRODUODENOSCOPY (EGD);  Surgeon: Gatha Mayer, MD;  Location: [facility];  Service: Endoscopy;  Laterality: N/A;  . Esophagogastroduodenoscopy (egd) with propofol N/A 07/29/2014    Procedure: ESOPHAGOGASTRODUODENOSCOPY (EGD) WITH PROPOFOL;  Surgeon: Inda Castle, MD;  Location: [facility];  Service: Endoscopy;  Laterality: N/A;   Family History:  Family History  Problem Relation Age of Onset  . Colon polyps Mother   . Hypertension Mother   . Thyroid disease Mother   . Alcoholism Mother   . Alcoholism Father   . Alcohol abuse Maternal Grandfather   . Alcohol abuse Paternal Grandfather   . Alcohol abuse Paternal Aunt   . Alcohol abuse Maternal Uncle    Social History:  History  Alcohol Use  . 42.0 oz/week  . 70 Glasses of wine per week    Comment: reports 3 bottle of wine and pint of or more of vodka daily. 2 bottles of wine currently and pint of vodka some days for at least 4-5 days.a month.  History  Drug Use No    Comment: 11/05/14 - pt states she did this approx. March 2014    History   Social History  . Marital Status: Married    Spouse Name: N/A    Number of Children: 2  . Years of Education: N/A   Occupational History  . Paralegal    Social History Main Topics  . Smoking status: Never Smoker   .  Smokeless tobacco: Never Used  . Alcohol Use: 42.0 oz/week    70 Glasses of wine per week     Comment: reports 3 bottle of wine and pint of or more of vodka daily. 2 bottles of wine currently and pint of vodka some days for at least 4-5 days.a month.    . Drug Use: No     Comment: 11/05/14 - pt states she did this approx. March 2014  . Sexual Activity: None   Other Topics Concern  . None   Social History Narrative   Lives with husband and 2 children. 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   Additional History:    Sleep: improved  Appetite:  Improved    Assessment:   Musculoskeletal: Strength & Muscle Tone: within normal limits- less tremulous  Gait & Station: normal Patient leans: N/A   Psychiatric Specialty Exam: Physical Exam  Review of Systems  Constitutional: Negative for fever and chills.  Respiratory: Negative for cough and shortness of breath.   Cardiovascular: Negative for chest pain.  Gastrointestinal: Negative for vomiting and blood in stool.  Skin: Negative for rash.  Neurological: Negative for seizures and loss of consciousness.  Psychiatric/Behavioral: Positive for substance abuse.    Blood pressure 104/62, pulse 101, temperature 98.3 F (36.8 C), temperature source Oral, resp. rate 16, height '5\' 2"'  (1.575 m), weight 177 lb (80.287 kg), last menstrual period 08/20/2014.Body mass index is 32.37 kg/(m^2).  General Appearance: Fairly Groomed and improved grooming  Eye Contact::  Good  Speech:  Normal Rate  Volume:  Normal  Mood:  Appears mildly depressed but denies depression  Affect:  more reactive  Thought Process:  Goal Directed and Linear  Orientation:  Full (Time, Place, and Person)-   Thought Content:  no hallucinations, no delusions  Suicidal Thoughts:  No  Homicidal Thoughts:  No  Memory:  recent and remote grossly intact   Judgement:  Fair  Insight:  Present  Psychomotor Activity:  Normal  Concentration:  Good   Recall:  Good  Fund of Knowledge:Good  Language: Good  Akathisia:  Negative  Handed:  Right  AIMS (if indicated):     Assets:  Communication Skills Housing Resilience  ADL's:  Fair   Cognition: WNL  Sleep:  Number of Hours: 6.75     Current Medications: Current Facility-Administered Medications  Medication Dose Route Frequency Provider Last Rate Last Dose  . acamprosate (CAMPRAL) tablet 666 mg  666 mg Oral TID WC Jenne Campus, MD   666 mg at 12/24/14 0620  . acetaminophen (TYLENOL) tablet 650 mg  650 mg Oral Q6H PRN Waylan Boga, NP      . alum & mag hydroxide-simeth (MAALOX/MYLANTA) 200-200-20 MG/5ML suspension 30 mL  30 mL Oral Q4H PRN Waylan Boga, NP   30 mL at 12/22/14 1634  . ferrous sulfate tablet 325 mg  325 mg Oral Daily Encarnacion Slates, NP   325 mg at 12/24/14 0813  . folic acid (FOLVITE) tablet 1 mg  1 mg Oral Daily Waylan Boga,  NP   1 mg at 12/24/14 0813  . hydrOXYzine (ATARAX/VISTARIL) tablet 25 mg  25 mg Oral Q6H PRN Encarnacion Slates, NP      . loperamide (IMODIUM) capsule 2-4 mg  2-4 mg Oral PRN Encarnacion Slates, NP      . LORazepam (ATIVAN) tablet 1 mg  1 mg Oral Q6H PRN Encarnacion Slates, NP   1 mg at 12/23/14 2139  . LORazepam (ATIVAN) tablet 1 mg  1 mg Oral BID Encarnacion Slates, NP   1 mg at 12/24/14 0813   Followed by  . [START ON 12/25/2014] LORazepam (ATIVAN) tablet 1 mg  1 mg Oral Daily Encarnacion Slates, NP      . magnesium hydroxide (MILK OF MAGNESIA) suspension 30 mL  30 mL Oral Daily PRN Waylan Boga, NP      . multivitamin with minerals tablet 1 tablet  1 tablet Oral Daily Waylan Boga, NP   1 tablet at 12/24/14 0813  . ondansetron (ZOFRAN) tablet 8 mg  8 mg Oral Q8H PRN Waylan Boga, NP      . ondansetron (ZOFRAN-ODT) disintegrating tablet 4 mg  4 mg Oral Q6H PRN Encarnacion Slates, NP      . pantoprazole (PROTONIX) EC tablet 40 mg  40 mg Oral BID AC Waylan Boga, NP   40 mg at 12/24/14 0620  . senna-docusate (Senokot-S) tablet 1 tablet  1 tablet Oral BID Encarnacion Slates, NP    1 tablet at 12/24/14 0813  . thiamine (VITAMIN B-1) tablet 100 mg  100 mg Oral Daily Waylan Boga, NP   100 mg at 12/24/14 0813   Or  . thiamine (B-1) injection 100 mg  100 mg Intravenous Daily Waylan Boga, NP      . thiamine (B-1) injection 100 mg  100 mg Intramuscular Once Encarnacion Slates, NP   100 mg at 12/22/14 1352  . traZODone (DESYREL) tablet 50 mg  50 mg Oral QHS PRN,MR X 1 Evanna Glenda Chroman, NP   50 mg at 12/23/14 2137    Lab Results:  Results for orders placed or performed during the hospital encounter of 12/22/14 (from the past 48 hour(s))  Iron and TIBC     Status: Abnormal   Collection Time: 12/23/14  6:16 AM  Result Value Ref Range   Iron 314 (H) 42 - 145 ug/dL   TIBC 466 250 - 470 ug/dL   Saturation Ratios 67 (H) 20 - 55 %   UIBC 152 125 - 400 ug/dL    Comment: Performed at [facility]  Ferritin     Status: None   Collection Time: 12/23/14  6:16 AM  Result Value Ref Range   Ferritin 52 10 - 291 ng/mL    Comment: Performed at [facility]  Occult blood card to lab, stool RN will collect     Status: None   Collection Time: 12/23/14 10:52 AM  Result Value Ref Range   Fecal Occult Bld NEGATIVE NEGATIVE    Comment: Performed at [facility]    Physical Findings: AIMS: Facial and Oral Movements Muscles of Facial Expression: None, normal Lips and Perioral Area: None, normal Jaw: None, normal Tongue: None, normal,Extremity Movements Upper (arms, wrists, hands, fingers): None, normal Lower (legs, knees, ankles, toes): None, normal, Trunk Movements Neck, shoulders, hips: None, normal, Overall Severity Severity of abnormal movements (highest score from questions above): None, normal Incapacitation due to abnormal movements: None, normal Patient's awareness of abnormal  movements (rate only patient's report): No Awareness, Dental Status Current problems with teeth and/or dentures?: No Does patient usually wear dentures?: No  CIWA:   CIWA-Ar Total: 1 COWS:      Assessment-  Patient is improving and today has mild/residual symptoms of alcohol withdrawal but is not in any acute distress. She is planning to go to a Rehab setting upon discharge. Although presents slightly constricted in affect, denies any depression at present. She is gradually gaining insight regarding the negative impact that alcohol has had on her physical health. Motivated to work on recovery, at this time. .   Treatment Plan Summary: Daily contact with patient to assess and evaluate symptoms and progress in treatment, Medication management, Plan continue inpatient treatment and continue medications as below  Completes Ativan Detox protocol tomorrow Continue Campral 666 mgrs TID Patient  Planning to go to [place] for ongoing treatment after discharge.  Medical Decision Making:  Established Problem, Stable/Improving (1), Review of Psycho-Social Stressors (1), Review or order clinical lab tests (1) and Review of Medication Regimen & Side Effects (2)     COBOS, FERNANDO 12/24/2014, 11:23 AM

## 2014-12-24 NOTE — BHH Group Notes (Signed)
   [facility] LCSW Aftercare Discharge Planning Group Note  12/24/2014  8:45 AM   Participation Quality: Alert, Appropriate and Oriented  Mood/Affect: Appropriate  Depression Rating: 0  Anxiety Rating: 3  Thoughts of Suicide: Pt denies SI/HI  Will you contract for safety? Yes  Current AVH: Pt denies  Plan for Discharge/Comments: Pt attended discharge planning group and actively participated in group. CSW provided pt with today's workbook. Patient reports feeling "good" today and plans to discharge to [facility] to continue treatment.  Transportation Means: Pt reports access to transportation  Supports: Patient has identified her family as supportive.  Tilden Fossa, MSW, [place] Worker [facility] [phone]

## 2014-12-24 NOTE — BHH Group Notes (Signed)
[place] LCSW Group Therapy 12/24/2014  1:15 PM Type of Therapy: Group Therapy Participation Level: Active  Participation Quality: Attentive, Sharing and Supportive  Affect: Depressed and Flat, tearful  Cognitive: Alert and Oriented  Insight: Developing/Improving and Engaged  Engagement in Therapy: Developing/Improving and Engaged  Modes of Intervention: Clarification, Confrontation, Discussion, Education, Exploration, Limit-setting, Orientation, Problem-solving, Rapport Building, Art therapist, Socialization and Support  Summary of Progress/Problems: The topic for group today was emotional regulation. This group focused on both positive and negative emotion identification and allowed group members to process ways to identify feelings, regulate negative emotions, and find healthy ways to manage internal/external emotions. Group members were asked to reflect on a time when their reaction to an emotion led to a negative outcome and explored how alternative responses using emotion regulation would have benefited them. Group members were also asked to discuss a time when emotion regulation was utilized when a negative emotion was experienced. Patient reports experiencing anxiety and fear related to being away from her family to go to treatment. Patient became tearful during discussion and CSW assisted patient to reframe her transition to treatment as a positive experience. CSW and other group members provided emotional support and encouragement.   Tilden Fossa, MSW, [place] Worker [facility] [phone]

## 2014-12-24 NOTE — Progress Notes (Signed)
Patient ID: Sara [name], female   DOB: May 20, 1976, 39 y.o.   MRN: [id number]  DAR: Pt. Denies SI/HI and A/V Hallucinations. Patient reports sleep is good, appetite is good, energy level is normal and concentration is good. Patient rates depression 1/10, hopelessness 0/10, and anxiety 2/10 for the day. Patient does not report any side effects today other than very mild tremors. Patient reports she is tolerating her medication well. Patient does not report any pain or discomfort at this time. Support and encouragement provided to the patient. Patient requested handout for Campral and this was provided. Scheduled medications administered to patient per physician's orders. Patient is receptive and cooperative with Probation officer but minimal. Patient is seen in the milieu and is attending groups. Q15 minute checks are maintained for safety.

## 2014-12-24 NOTE — Progress Notes (Signed)
Recreation Therapy Notes   Animal-Assisted Activity/Therapy (AAA/T) Program Checklist/Progress Notes Patient Eligibility Criteria Checklist & Daily Group note for Rec Tx Intervention  Date: 02.02.2016 Time: 2:45pm Location: [address]   AAA/T Program Assumption of Risk Form signed by Patient/ or Parent Legal Guardian yes  Patient is free of allergies or sever asthma yes  Patient reports no fear of animals yes  Patient reports no history of cruelty to animals yes  Patient understands his/her participation is voluntary yes  Patient washes hands before animal contact yes  Patient washes hands after animal contact yes  Behavioral Response: Appropriate   Education: Hand Washing, Appropriate Animal Interaction   Education Outcome: Acknowledges education.   Clinical Observations/Feedback: Patient interacted appropriately with therapy dog and peers during session.   Laureen Ochs Jeremiah Curci, LRT/CTRS  Rana Hochstein L 12/24/2014 7:51 AM

## 2014-12-25 ENCOUNTER — Encounter (HOSPITAL_COMMUNITY): Payer: Self-pay | Admitting: Registered Nurse

## 2014-12-25 MED ORDER — TRAZODONE HCL 50 MG PO TABS: 50.0000 mg | ORAL_TABLET | Freq: Every evening | ORAL | Status: DC | PRN

## 2014-12-25 MED ORDER — HYDROXYZINE HCL 25 MG PO TABS
25.0000 mg | ORAL_TABLET | Freq: Three times a day (TID) | ORAL | Status: DC | PRN
  Filled 2014-12-25: qty 20

## 2014-12-25 MED ORDER — NADOLOL 40 MG PO TABS: 40.0000 mg | ORAL_TABLET | Freq: Every day | ORAL | Status: DC

## 2014-12-25 MED ORDER — PANTOPRAZOLE SODIUM 40 MG PO TBEC
40.0000 mg | DELAYED_RELEASE_TABLET | Freq: Every day | ORAL | Status: DC
  Filled 2014-12-25: qty 1

## 2014-12-25 MED ORDER — HYDROXYZINE HCL 25 MG PO TABS: 25.0000 mg | ORAL_TABLET | Freq: Three times a day (TID) | ORAL | Status: DC | PRN

## 2014-12-25 MED ORDER — FERROUS SULFATE 325 (65 FE) MG PO TABS: 325.0000 mg | ORAL_TABLET | Freq: Every day | ORAL | Status: DC

## 2014-12-25 MED ORDER — ADULT MULTIVITAMIN W/MINERALS CH: 1.0000 | ORAL_TABLET | Freq: Every day | ORAL | Status: DC

## 2014-12-25 MED ORDER — ACAMPROSATE CALCIUM 333 MG PO TBEC: 666.0000 mg | DELAYED_RELEASE_TABLET | Freq: Three times a day (TID) | ORAL | Status: DC

## 2014-12-25 MED ORDER — FOLIC ACID 1 MG PO TABS: 1.0000 mg | ORAL_TABLET | Freq: Every day | ORAL | Status: DC

## 2014-12-25 MED ORDER — PANTOPRAZOLE SODIUM 40 MG PO TBEC: 40.0000 mg | DELAYED_RELEASE_TABLET | Freq: Every day | ORAL | Status: DC

## 2014-12-25 NOTE — BHH Group Notes (Signed)
[place] LCSW Group Therapy 12/25/2014 1:15 PM Type of Therapy: Group Therapy Participation Level: Active  Participation Quality: Attentive, Sharing and Supportive  Affect: Appropriate  Cognitive: Alert and Oriented  Insight: Developing/Improving and Engaged  Engagement in Therapy: Developing/Improving and Engaged  Modes of Intervention: Activity, Clarification, Confrontation, Discussion, Education, Exploration, Limit-setting, Orientation, Problem-solving, Rapport Building, Art therapist, Socialization and Support  Summary of Progress/Problems: Patient was attentive and engaged with speaker from [place]. Patient was attentive to speaker while they shared their story of dealing with mental health and overcoming it. Patient expressed interest in their programs and services and received information on their agency. Patient processed ways they can relate to the speaker.   Tilden Fossa, MSW, [place] Worker [facility] [phone]

## 2014-12-25 NOTE — BHH Suicide Risk Assessment (Addendum)
[facility] Discharge Suicide Risk Assessment   Demographic Factors:  39 year old married female   Total Time spent with patient: 30 minutes  Musculoskeletal: Strength & Muscle Tone: within normal limits- at this time no significant tremors or restlessness  Gait & Station: normal Patient leans: N/A  Psychiatric Specialty Exam: Physical Exam  ROS  Blood pressure 106/67, pulse 97, temperature 98.1 F (36.7 C), temperature source Oral, resp. rate 16, height 5\' 2"  (1.575 m), weight 177 lb (80.287 kg), last menstrual period 08/20/2014.Body mass index is 32.37 kg/(m^2).  General Appearance: Well Groomed  Engineer, water::  Good  Speech:  Normal Rate  Volume:  Normal  Mood:  improved, denies depression at present  Affect:  more reactive, less constricted  Thought Process:  Goal Directed and Linear  Orientation:  Full (Time, Place, and Person)  Thought Content:  no hallucinations,no delusions  Suicidal Thoughts:  No  Homicidal Thoughts:  No  Memory:  Recent and Remote grossly intact  Judgement:  Other:  improved   Insight:  Present  Psychomotor Activity:  Normal  Concentration:  Good  Recall:  Good  Fund of Knowledge:Good  Language: Good  Akathisia:  Negative  Handed:  Right  AIMS (if indicated):     Assets:  Communication Skills Desire for Improvement Resilience Social Support  Sleep:  Number of Hours: 6  Cognition: WNL  ADL's:  Improved    Have you used any form of tobacco in the last 30 days? (Cigarettes, Smokeless Tobacco, Cigars, and/or Pipes): No  Has this patient used any form of tobacco in the last 30 days? (Cigarettes, Smokeless Tobacco, Cigars, and/or Pipes) No  Mental Status Per Nursing Assessment::   On Admission:     Current Mental Status by Physician: At this time patient much improved compared to admission- she is not currently presenting with any ongoing withdrawal symptoms. No tremors, no diaphoresis, no acute distress or agitation. She is reporting improved mood,  denies depression, and affect is less constricted. She is not thought disordered, no SI or HI, no psychosis, future oriented    Loss Factors: Alcohol related family tension/stress   Historical Factors: Long history of alcohol dependence  Risk Reduction Factors:   Responsible for children under 26 years of age, Sense of responsibility to family, Positive social support and Positive coping skills or problem solving skills  Continued Clinical Symptoms:  As noted above, at this time much improved, with full range of affect. No SI or HI, no psychotic symptoms. She is future oriented and motivated in recovery/ relapse prevention efforts . No withdrawal symptoms at this time.  Cognitive Features That Contribute To Risk:  Fully alert and attentive , oriented x 3   Suicide Risk:  Mild:  Suicidal ideation of limited frequency, intensity, duration, and specificity.  There are no identifiable plans, no associated intent, mild dysphoria and related symptoms, good self-control (both objective and subjective assessment), few other risk factors, and identifiable protective factors, including available and accessible social support.  Principal Problem: Alcohol dependence with withdrawal with complication Discharge Diagnoses:  Patient Active Problem List   Diagnosis Date Noted  . Alcohol dependence with withdrawal with complication [H47.425] 95/63/8756  . Alcohol dependence [F10.20] 11/05/2014  . Hematemesis [K92.0] 11/05/2014  . GIB (gastrointestinal bleeding) [K92.2] 11/05/2014  . Pancytopenia [D61.818] 11/05/2014  . Alcohol dependence with alcohol-induced mood disorder [F10.24]   . Esophageal varices [I85.00] 09/12/2014  . Thrombocytopenia [D69.6] 09/12/2014  . Alcohol dependence syndrome [F10.20] 02/01/2014  . Post traumatic stress  disorder (PTSD) [F43.10] 02/01/2014  . Pancreatitis [K85.9] 01/15/2014  . Abdominal pain [R10.9] 01/15/2014  . Substance induced mood disorder [F19.94] 09/28/2013   . Anemia [D64.9] 07/01/2013  . Anxiety state [F41.1] 06/30/2013  . 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 Depression [F32.9]   . GERD (gastroesophageal reflux disease) [K21.9]   . Portal hypertension [K76.6]   . Abnormal uterine bleeding [N93.9] 05/31/2013    Follow-up Information    Follow up with [facility] On 12/25/2014.   Why:  Please present to [facility] for continued treatment by 9 pm. Please call if you need to reschedule.    Contact information:   [address] [place] [address] [phone]      Plan Of Care/Follow-up recommendations:  Activity:  As tolerated Diet:  Regular Tests:  NA Other:  see below  Is patient on multiple antipsychotic therapies at discharge:  No   Has Patient had three or more failed trials of antipsychotic monotherapy by history:  No  Recommended Plan for Multiple Antipsychotic Therapies: NA  Patient is being discharged in good spirits. Reports high degree of motivation in continuing to work on recovery/relapse prevention efforts . Plans to follow up at [facility]) which is a residential rehabilitation setting. Patient encouraged to follow up with her PCP for ongoing management /monitoring of CBC/Anemia   COBOS, FERNANDO 12/25/2014, 2:03 PM

## 2014-12-25 NOTE — Progress Notes (Signed)
Pt was discharged today to follow-up at [facility] [place]. She denied any S/I H/I or A/V hallucinations. She was given f/u appointment, rx, sample medications, and hotline info booklet. She voiced understanding to all instructions provided. She declined the need for smoking cessation materials.

## 2014-12-25 NOTE — Progress Notes (Signed)
  [facility] Adult Case Management Discharge Plan :  Will you be returning to the same living situation after discharge:  No. Patient plans to discharge to [facility]. At discharge, do you have transportation home?: Yes,  patient reports access to transportation via family Do you have the ability to pay for your medications: Yes,  patient will be provided with necessary prescriptions at discharge and verbalizes her ability to obtain medications  Release of information consent forms completed and in the chart;  Patient's signature needed at discharge.  Patient to Follow up at: Follow-up Information    Follow up with [facility] On 12/25/2014.   Why:  Please present to [facility] [place] for continued treatment by 9 pm. Please call if you need to reschedule.    Contact information:   [address] [place] [address] [phone]      Patient denies SI/HI: Yes,  denies    Safety Planning and Suicide Prevention discussed: Yes,  with patient  Has patient been referred to the Quitline?: N/A patient is not a smoker  Sara [name], Casimiro Needle 12/25/2014, 9:51 AM

## 2014-12-25 NOTE — BHH Group Notes (Signed)
0900 nursing orientation group  The focus of this group is to educate the patient on the purpose and policies of crisis stabilization and provide a format to answer questions about their admission.  The group details unit policies and expectations of patients while admitted.  Pt was an active participant and appropriate in sharing with the group.

## 2014-12-25 NOTE — Discharge Summary (Signed)
Physician Discharge Summary Note  Patient:  Sara [name] is an 39 y.o., female MRN:  [id number] DOB:  August 23, 1976 Patient phone:  [phone] (home)  Patient address:   [address] [place] [address],  Total Time spent with patient: Greater than 30 minutes  Date of Admission:  12/22/2014 Date of Discharge: 12/25/2014  Reason for Admission:  Sara [name] is a 39 year old female. Admitted from the [facility] because of alcoholism. She reports, "I have been drinking heavily x 6 weeks (a month & half), 2-3 bottles of wine daily. I realized that my drinking is getting out of hand, I decided to seek help. I'm tired of drinking too much. I'm not depressed, rather saddened at a time when I left [place] . I'm no longer sad. I have no hallucinations or psychosis. I'm not suicidal. I have been abusing alcohol off & on x many years. My longest sobriety was 65 days. I had been to a rehab center in the past. I have not experienced withdrawal seizures and or black-outs. I have had no DUIs. No drug use or abuse, no weed. I had used Klonopin in the past due to my high anxiety. I have no psychiatrist, no suicide attempts. I had depression during my teen years. I was tried on Zoloft, Lexapro, Prozac. I don't smoke. I'm interested in a rehab treatment after discharge"   Principal Problem: Alcohol dependence with withdrawal with complication Discharge Diagnoses: Patient Active Problem List   Diagnosis Date Noted  . Alcohol dependence with withdrawal with complication [C62.376] 28/31/5176  . Alcohol dependence [F10.20] 11/05/2014  . Hematemesis [K92.0] 11/05/2014  . GIB (gastrointestinal bleeding) [K92.2] 11/05/2014  . Pancytopenia [D61.818] 11/05/2014  . Alcohol dependence with alcohol-induced mood disorder [F10.24]   . Esophageal varices [I85.00] 09/12/2014  . Thrombocytopenia [D69.6] 09/12/2014  . Alcohol dependence syndrome [F10.20] 02/01/2014  . Post traumatic stress disorder (PTSD)  [F43.10] 02/01/2014  . Pancreatitis [K85.9] 01/15/2014  . Abdominal pain [R10.9] 01/15/2014  . Substance induced mood disorder [F19.94] 09/28/2013  . Anemia [D64.9] 07/01/2013  . Anxiety state [F41.1] 06/30/2013  . 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 Depression [F32.9]   . GERD (gastroesophageal reflux disease) [K21.9]   . Portal hypertension [K76.6]   . Abnormal uterine bleeding [N93.9] 05/31/2013    Musculoskeletal: Strength & Muscle Tone: within normal limits Gait & Station: normal Patient leans: N/A  Psychiatric Specialty Exam:  See Suicide Risk Assessment Physical Exam  Review of Systems  Psychiatric/Behavioral: Positive for substance abuse. Negative for suicidal ideas, hallucinations and memory loss. Depression: Stable. Nervous/anxious: Stable. Insomnia: Stable.     Blood pressure 106/67, pulse 97, temperature 98.1 F (36.7 C), temperature source Oral, resp. rate 16, height 5\' 2"  (1.575 m), weight 80.287 kg (177 lb), last menstrual period 08/20/2014.Body mass index is 32.37 kg/(m^2).   Past Medical History:  Past Medical History  Diagnosis Date  . Depression   . Pneumonia   . Cirrhosis   . Portal hypertension   . S/P alcohol detoxification     2-3 days at behavioral health previously  . Alcoholism   . Anxiety   . GERD (gastroesophageal reflux disease)   . 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 Menorrhagia   . Pancytopenia 01/15/2014  . UGI bleed 06/12/2014    Past Surgical History  Procedure Laterality Date  . Cholecystectomy    . Esophagogastroduodenoscopy N/A 06/12/2014    Procedure: ESOPHAGOGASTRODUODENOSCOPY (EGD);  Surgeon: Gatha Mayer, MD;  Location: [facility];  Service: Endoscopy;  Laterality: N/A;  . Esophagogastroduodenoscopy (egd) with propofol N/A 07/29/2014    Procedure: ESOPHAGOGASTRODUODENOSCOPY (EGD) WITH PROPOFOL;  Surgeon: Inda Castle, MD;  Location: [facility];  Service: Endoscopy;  Laterality: N/A;   Family  History:  Family History  Problem Relation Age of Onset  . Colon polyps Mother   . Hypertension Mother   . Thyroid disease Mother   . Alcoholism Mother   . Alcoholism Father   . Alcohol abuse Maternal Grandfather   . Alcohol abuse Paternal Grandfather   . Alcohol abuse Paternal Aunt   . Alcohol abuse Maternal Uncle    Social History:  History  Alcohol Use  . 42.0 oz/week  . 70 Glasses of wine per week    Comment: reports 3 bottle of wine and pint of or more of vodka daily. 2 bottles of wine currently and pint of vodka some days for at least 4-5 days.a month.       History  Drug Use No    Comment: 11/05/14 - pt states she did this approx. March 2014    History   Social History  . Marital Status: Married    Spouse Name: N/A    Number of Children: 2  . Years of Education: N/A   Occupational History  . Paralegal    Social History Main Topics  . Smoking status: Never Smoker   . Smokeless tobacco: Never Used  . Alcohol Use: 42.0 oz/week    70 Glasses of wine per week     Comment: reports 3 bottle of wine and pint of or more of vodka daily. 2 bottles of wine currently and pint of vodka some days for at least 4-5 days.a month.    . Drug Use: No     Comment: 11/05/14 - pt states she did this approx. March 2014  . Sexual Activity: None   Other Topics Concern  . None   Social History Narrative   Lives with husband and 2 children. 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    Past Psychiatric History: Diagnosis: Alcohol Related Disorder - Severe (303.90), Substance induced mood disorder  Hospitalizations: [facility]  Outpatient Care: [facility]  Substance Abuse Care: [place]  Self-Mutilation: NA  Suicidal Attempts: NA  Violent Behaviors: NA     Risk to Self: Is patient at risk for suicide?: No What has been your use of drugs/alcohol within the last 12 months?: Drinking daily- approximately 2-4  bottles of wine Risk to Others:   Prior Inpatient Therapy:   Prior Outpatient Therapy:    Level of Care:  OP  Hospital Course:   Sara [name] was admitted for Alcohol dependence with withdrawal with complication and crisis management.  She was treated with Campral for alcoholism; Librium protocol for alcohol detox, Vistaril for anxiety; Trazodone for insomnia.  Medical problems were identified and treated as needed.  Home medications were restarted as appropriate.  Improvement was monitored by observation and Madison Hickman daily report of symptom reduction.  Emotional and mental status was monitored by daily self inventory reports completed by Madison Hickman and clinical staff.         Sara [name] was evaluated by the treatment team for stability and plans for continued recovery upon discharge.  She was offered further treatment options upon discharge including Residential, Intensive Outpatient and Outpatient treatment. She will follow up with [facility] inpatient rehab services.   Madison Hickman  motivation was an integral factor for scheduling further treatment.  Employment, transportation, bed availability, health status, family support, and any pending legal issues were also considered during her hospital stay.  Upon completion of this admission the patient was both mentally and medically stable for discharge denying suicidal/homicidal ideation, auditory/visual/tactile hallucinations, delusional thoughts and paranoia.      Consults:  psychiatry  Significant Diagnostic Studies:  labs: USD, ETOH, CBC/Diff, CMET, Urinialysis  Discharge Vitals:   Blood pressure 106/67, pulse 97, temperature 98.1 F (36.7 C), temperature source Oral, resp. rate 16, height 5\' 2"  (1.575 m), weight 80.287 kg (177 lb), last menstrual period 08/20/2014. Body mass index is 32.37 kg/(m^2). Lab Results:   Results for orders placed or performed during the hospital encounter of 12/22/14 (from the past 72  hour(s))  Iron and TIBC     Status: Abnormal   Collection Time: 12/23/14  6:16 AM  Result Value Ref Range   Iron 314 (H) 42 - 145 ug/dL   TIBC 466 250 - 470 ug/dL   Saturation Ratios 67 (H) 20 - 55 %   UIBC 152 125 - 400 ug/dL    Comment: Performed at [facility]  Ferritin     Status: None   Collection Time: 12/23/14  6:16 AM  Result Value Ref Range   Ferritin 52 10 - 291 ng/mL    Comment: Performed at [facility]  Occult blood card to lab, stool RN will collect     Status: None   Collection Time: 12/23/14 10:52 AM  Result Value Ref Range   Fecal Occult Bld NEGATIVE NEGATIVE    Comment: Performed at [facility]    Physical Findings: AIMS: Facial and Oral Movements Muscles of Facial Expression: None, normal Lips and Perioral Area: None, normal Jaw: None, normal Tongue: None, normal,Extremity Movements Upper (arms, wrists, hands, fingers): None, normal Lower (legs, knees, ankles, toes): None, normal, Trunk Movements Neck, shoulders, hips: None, normal, Overall Severity Severity of abnormal movements (highest score from questions above): None, normal Incapacitation due to abnormal movements: None, normal Patient's awareness of abnormal movements (rate only patient's report): No Awareness, Dental Status Current problems with teeth and/or dentures?: No Does patient usually wear dentures?: No  CIWA:  CIWA-Ar Total: 1 COWS:      See Psychiatric Specialty Exam and Suicide Risk Assessment completed by Attending Physician prior to discharge.  Discharge destination:  Home  Is patient on multiple antipsychotic therapies at discharge:  No   Has Patient had three or more failed trials of antipsychotic monotherapy by history:  No    Recommended Plan for Multiple Antipsychotic Therapies: NA  Discharge Instructions    Discharge instructions    Complete by:  As directed   Take all of you medications as prescribed by your mental healthcare  provider.  Report any adverse effects and reactions from your medications to your outpatient provider promptly. Do not engage in alcohol and or illegal drug use while on prescription medicines. In the event of worsening symptoms call the crisis hotline, 911, and or go to the nearest emergency department for appropriate evaluation and treatment of symptoms. Follow-up with your primary care provider for your medical issues, concerns and or health care needs.   Keep all scheduled appointments.  If you are unable to keep an appointment call to reschedule.  Let the nurse know if you will need medications before next scheduled appointment.            Medication List  TAKE these medications      Indication   acamprosate 333 MG tablet  Commonly known as:  CAMPRAL  Take 2 tablets (666 mg total) by mouth 3 (three) times daily with meals. For alcoholism   Indication:  Excessive Use of Alcohol     ferrous sulfate 325 (65 FE) MG tablet  Take 1 tablet (325 mg total) by mouth daily. For iron deficiency   Indication:  Iron Deficiency     folic acid 1 MG tablet  Commonly known as:  FOLVITE  Take 1 tablet (1 mg total) by mouth daily. For folate deficiency   Indication:  Deficiency of Folic Acid in the Diet     hydrOXYzine 25 MG tablet  Commonly known as:  ATARAX/VISTARIL  Take 1 tablet (25 mg total) by mouth 3 (three) times daily as needed for anxiety.   Indication:  anxiety     multivitamin with minerals Tabs tablet  Take 1 tablet by mouth daily. For nutritional supplement   Indication:  Vitamin supplement for nutritional supplementation     nadolol 40 MG tablet  Commonly known as:  CORGARD  Take 1 tablet (40 mg total) by mouth daily. For hypertension   Indication:  Hypertension in the Portal Veins of the Liver     pantoprazole 40 MG tablet  Commonly known as:  PROTONIX  Take 1 tablet (40 mg total) by mouth daily. For reflux      traZODone 50 MG tablet  Commonly known as:  DESYREL   Take 1 tablet (50 mg total) by mouth at bedtime as needed and may repeat dose one time if needed for sleep.   Indication:  Trouble Sleeping           Follow-up Information    Follow up with [facility] On 12/25/2014.   Why:  Please present to [facility] for continued treatment by 9 pm. Please call if you need to reschedule.    Contact information:   [address] [place] [address] [phone]      Follow-up recommendations:  Activity:  As tolerated Diet:  AS tolerated  Comments:   Patient has been instructed to take medications as prescribed; and report adverse effects to outpatient provider.  Follow up with primary doctor for any medical issues and If symptoms recur report to nearest emergency or crisis hot line.    Total Discharge Time: Greater than 30 minutes  Signed: Earleen Newport, FNP-BC 12/25/2014, 10:35 AM   Patient seen, Suicide Assessment Completed.  Disposition Plan Reviewed

## 2014-12-30 NOTE — Progress Notes (Signed)
Patient Discharge Instructions:  After Visit Summary (AVS):   Faxed to:  12/30/14 Discharge Summary Note:   Faxed to:  12/30/14 Psychiatric Admission Assessment Note:   Faxed to:  12/30/14 Suicide Risk Assessment - Discharge Assessment:   Faxed to:  12/30/14 Faxed/Sent to the Next Level Care provider:  12/30/14 Faxed to [facility] @ [place], 12/30/2014, 3:45 PM

## 2015-02-10 ENCOUNTER — Encounter (HOSPITAL_COMMUNITY): Payer: Self-pay | Admitting: Family Medicine

## 2015-02-10 ENCOUNTER — Emergency Department (HOSPITAL_COMMUNITY)
Admission: EM | Admit: 2015-02-10 | Discharge: 2015-02-11 | Disposition: A | Discharge status: Home / Self Care | Payer: 59 | Attending: Emergency Medicine | Admitting: Emergency Medicine

## 2015-02-10 DIAGNOSIS — Z3202 Encounter for pregnancy test, result negative: Secondary | ICD-10-CM | POA: Insufficient documentation

## 2015-02-10 DIAGNOSIS — F329 Major depressive disorder, single episode, unspecified: Secondary | ICD-10-CM | POA: Diagnosis not present

## 2015-02-10 DIAGNOSIS — Z8679 Personal history of other diseases of the circulatory system: Secondary | ICD-10-CM | POA: Diagnosis not present

## 2015-02-10 DIAGNOSIS — R109 Unspecified abdominal pain: Secondary | ICD-10-CM | POA: Diagnosis present

## 2015-02-10 DIAGNOSIS — Z8701 Personal history of pneumonia (recurrent): Secondary | ICD-10-CM | POA: Diagnosis not present

## 2015-02-10 DIAGNOSIS — R1013 Epigastric pain: Secondary | ICD-10-CM | POA: Diagnosis not present

## 2015-02-10 DIAGNOSIS — D61818 Other pancytopenia: Secondary | ICD-10-CM | POA: Insufficient documentation

## 2015-02-10 DIAGNOSIS — F419 Anxiety disorder, unspecified: Secondary | ICD-10-CM | POA: Diagnosis not present

## 2015-02-10 DIAGNOSIS — F10129 Alcohol abuse with intoxication, unspecified: Secondary | ICD-10-CM | POA: Insufficient documentation

## 2015-02-10 DIAGNOSIS — K219 Gastro-esophageal reflux disease without esophagitis: Secondary | ICD-10-CM | POA: Diagnosis not present

## 2015-02-10 DIAGNOSIS — Z79899 Other long term (current) drug therapy: Secondary | ICD-10-CM | POA: Insufficient documentation

## 2015-02-10 DIAGNOSIS — R011 Cardiac murmur, unspecified: Secondary | ICD-10-CM | POA: Insufficient documentation

## 2015-02-10 DIAGNOSIS — F1010 Alcohol abuse, uncomplicated: Secondary | ICD-10-CM

## 2015-02-10 LAB — COMPREHENSIVE METABOLIC PANEL
ALBUMIN: 3.5 g/dL (ref 3.5–5.2)
ALT: 35 U/L (ref 0–35)
ANION GAP: 13 (ref 5–15)
AST: 138 U/L — ABNORMAL HIGH (ref 0–37)
Alkaline Phosphatase: 116 U/L (ref 39–117)
BUN: 10 mg/dL (ref 6–23)
CHLORIDE: 103 mmol/L (ref 96–112)
CO2: 23 mmol/L (ref 19–32)
CREATININE: 0.42 mg/dL — AB (ref 0.50–1.10)
Calcium: 8.1 mg/dL — ABNORMAL LOW (ref 8.4–10.5)
GFR calc Af Amer: >90 mL/min (ref >90)
GFR calc non Af Amer: >90 mL/min (ref >90)
Glucose, Bld: 132 mg/dL — ABNORMAL HIGH (ref 70–99)
Potassium: 3.5 mmol/L (ref 3.5–5.1)
Sodium: 139 mmol/L (ref 135–145)
TOTAL PROTEIN: 8 g/dL (ref 6.0–8.3)
Total Bilirubin: 1.6 mg/dL — ABNORMAL HIGH (ref 0.3–1.2)

## 2015-02-10 LAB — CBC WITH DIFFERENTIAL/PLATELET
Basophils Absolute: 0 10*3/uL (ref 0.0–0.1)
Basophils Relative: 0 % (ref 0–1)
Eosinophils Absolute: 0 10*3/uL (ref 0.0–0.7)
Eosinophils Relative: 0 % (ref 0–5)
HCT: 33.9 % — ABNORMAL LOW (ref 36.0–46.0)
HEMOGLOBIN: 10.7 g/dL — AB (ref 12.0–15.0)
LYMPHS PCT: 55 % — AB (ref 12–46)
Lymphs Abs: 2.8 10*3/uL (ref 0.7–4.0)
MCH: 31.4 pg (ref 26.0–34.0)
MCHC: 31.6 g/dL (ref 30.0–36.0)
MCV: 99.4 fL (ref 78.0–100.0)
MONO ABS: 0.2 10*3/uL (ref 0.1–1.0)
MONOS PCT: 3 % (ref 3–12)
Neutro Abs: 2.1 10*3/uL (ref 1.7–7.7)
Neutrophils Relative %: 41 % — ABNORMAL LOW (ref 43–77)
PLATELETS: 41 10*3/uL — AB (ref 150–400)
RBC: 3.41 MIL/uL — AB (ref 3.87–5.11)
RDW: 17.5 % — ABNORMAL HIGH (ref 11.5–15.5)
WBC: 5.1 10*3/uL (ref 4.0–10.5)

## 2015-02-10 LAB — URINALYSIS, ROUTINE W REFLEX MICROSCOPIC
GLUCOSE, UA: NEGATIVE mg/dL
Hgb urine dipstick: NEGATIVE
Ketones, ur: 15 mg/dL — AB
Leukocytes, UA: NEGATIVE
Nitrite: NEGATIVE
PH: 6 (ref 5.0–8.0)
Protein, ur: 100 mg/dL — AB
Specific Gravity, Urine: 1.028 (ref 1.005–1.030)
Urobilinogen, UA: 1 mg/dL (ref 0.0–1.0)

## 2015-02-10 LAB — PREGNANCY, URINE: Preg Test, Ur: NEGATIVE

## 2015-02-10 LAB — URINE MICROSCOPIC-ADD ON

## 2015-02-10 LAB — ETHANOL: Alcohol, Ethyl (B): 385 mg/dL — ABNORMAL HIGH (ref 0–9)

## 2015-02-10 LAB — LIPASE, BLOOD: LIPASE: 80 U/L — AB (ref 11–59)

## 2015-02-10 MED ORDER — FAMOTIDINE 20 MG PO TABS: 20.0000 mg | ORAL_TABLET | Freq: Two times a day (BID) | ORAL | Status: DC

## 2015-02-10 MED ORDER — SODIUM CHLORIDE 0.9 % IV BOLUS (SEPSIS)
1000.0000 mL | Freq: Once | INTRAVENOUS | Status: AC
  Administered 2015-02-10: 1000 mL via INTRAVENOUS

## 2015-02-10 MED ORDER — GI COCKTAIL ~~LOC~~
30.0000 mL | Freq: Once | ORAL | Status: AC
  Administered 2015-02-10: 30 mL via ORAL
  Filled 2015-02-10: qty 30

## 2015-02-10 NOTE — Discharge Instructions (Signed)
Please call your doctor for a followup appointment within 24-48 hours. When you talk to your doctor please let them know that you were seen in the emergency department and have them acquire all of your records so that they can discuss the findings with you and formulate a treatment plan to fully care for your new and ongoing problems. ° ° °Emergency Department Resource Guide °1) Find a Doctor and Pay Out of Pocket °Although you won't have to find out who is covered by your insurance plan, it is a good idea to ask around and get recommendations. You will then need to call the office and see if the doctor you have chosen will accept you as a new patient and what types of options they offer for patients who are self-pay. Some doctors offer discounts or will set up payment plans for their patients who do not have insurance, but you will need to ask so you aren't surprised when you get to your appointment. ° °2) Contact Your Local Health Department °Not all health departments have doctors that can see patients for sick visits, but many do, so it is worth a call to see if yours does. If you don't know where your local health department is, you can check in your phone book. The CDC also has a tool to help you locate your state's health department, and many state websites also have listings of all of their local health departments. ° °3) Find a Walk-in Clinic °If your illness is not likely to be very severe or complicated, you may want to try a walk in clinic. These are popping up all over the country in pharmacies, drugstores, and shopping centers. They're usually staffed by nurse practitioners or physician assistants that have been trained to treat common illnesses and complaints. They're usually fairly quick and inexpensive. However, if you have serious medical issues or chronic medical problems, these are probably not your best option. ° °No Primary Care Doctor: °- Call Health Connect at  [phone] - they can help you  locate a primary care doctor that  accepts your insurance, provides certain services, etc. °- Physician Referral Service- 1-800-533-3463 ° °Chronic Pain Problems: °Organization         Address  Phone   Notes  °[facility]  ([phone] Patients need to be referred by their primary care doctor.  ° °Medication Assistance: °Organization         Address  Phone   Notes  °[place] [facility] [address] °[place], [place] [address] [phone]) [phone] --Must be a resident of [place] °-- Must have NO insurance coverage whatsoever (no Medicaid/ Medicare, etc.) °-- The pt. MUST have a primary care doctor that directs their care regularly and follows them in the community °  °[facility]  (866) 331-1348   °[employer]  (888) 892-1162   ° °Agencies that provide inexpensive medical care: °Organization         Address  Phone   Notes  °[facility]  ([phone]) [phone]   °[facility]    ([phone]) [phone]   °[facility] [address] °[place], [place] [address] ([phone]) [phone]   °[facility] [address], °[place] ([phone]) [phone]   °Planned Parenthood    (336) 373-0678   °[facility]    (336) 272-1050   °[facility] ° [address], [place] Phone:  ([phone])   [phone], Fax:  [phone] Hours of Operation:  9 am - 6 pm, M-F.  Also accepts Medicaid/Medicare and self-pay.  [facility] for [place] [place], [address], [place] Phone: [phone], Fax: [phone]. Hours of Operation:  8:30 am - 5:30 pm, M-F.  Also accepts Medicaid and self-pay.  [facility] [place] [address], [place] Phone: [phone]   [place], [place], [place] [phone], Ext. [phone] Mondays & Thursdays: 7-9 AM.  First 15 patients are seen on a first come, first serve basis.    [facility]  Providers:  Organization         Address  Phone   Notes  [facility] [address], [place] [phone] Also accepts self-pay patients.  [facility] [address], [place]  [phone]   [place], [address], [place] [phone]   [facility] [address] [place] [phone]   Lucianne Lei [address], [place]   [phone] Only accepts [place] Access [place] patients after they have their name applied to their card.   Self-Pay (no insurance) in [facility]:  Organization         Address  Phone   Notes  Sickle Cell Patients, [facility] [place] [phone]   [facility] [place] [phone]   [facility] [place]  [place], [place], [place] [phone]   [facility]/Dr. Osei-Bonsu  [address], [place] or [address], [place] [phone] Phone number for both [place] and [place] locations is the same.  [facility] [address], [place] [phone]   [facility] [address] [place] or [address] [phone]   [facility] [address], [place] [phone], phone; [phone], fax Sees patients 1st and 3rd Saturday of every month.  Must not qualify for public or private insurance (i.e. Medicaid, Medicare, [place] Health Choice, Veterans' Benefits)  Household income should be no more than 200% of the poverty level The clinic cannot treat you if you are pregnant or think you are pregnant  Sexually transmitted diseases are not treated at the clinic.    Dental Care: Organization         Address  Phone  Notes  [facility] [phone] Accepts children up to age 64 who are enrolled in [place] or [place]; pregnant women with a Medicaid card; and children who have applied for Medicaid or [place] Health Choice, but were declined, whose parents can pay a reduced fee at time of service.  [facility]  [address], [place] [phone] Accepts children up to age 31 who are enrolled in [place] or [place]; pregnant women with a Medicaid card; and children who have applied for Medicaid or [place] Health Choice, but were declined, whose parents can pay a reduced fee at time of service.  [facility]  [place] [phone] Patients are seen by appointment only. Walk-ins are  not accepted. [place] will see patients 62 years of age and older. Monday - Tuesday (8am-5pm) Most Wednesdays (8:30-5pm) $30 per visit, cash only  [facility] Adult Dental Access PRO[facility]  [address], [facility] [phone] Patients are seen by appointment only. Walk-ins are not accepted. [place] will see patients 62 years of age and older. One Wednesday Evening (Monthly: Volunteer Based).  $30 per visit, cash only  [place]  [phone] for adults; Children under age 67, call Graduate Pediatric Dentistry at [phone]. Children aged 11-14, please call [phone] to request a pediatric application.  Dental services are provided in all areas of dental care including fillings, crowns and bridges, complete and partial dentures, implants, gum treatment, root canals, and extractions. Preventive care is also provided. Treatment is provided to both adults and children. Patients are selected via a lottery and there is often a waiting list.   [facility] [address] [place]  [phone] [email or web address]   [facility]  [address] [place], [place] [phone], Ext. [phone] Second and Fourth Thursday of each month, opens at 6:30 AM; Clinic ends at 9 AM.  Patients are seen on a first-come first-served basis, and a limited number are seen during each clinic.   [facility]  [address] [place] D[place]er [place], [place] [phone]   Eligibility Requirements You must have lived in [place], [place], or [place] counties for at least the last three months.   You cannot be eligible for state or federal sponsored [employer], including [employer], [place], or [employer].   You generally cannot be eligible for healthcare insurance through your employer.    How to apply: Eligibility screenings are held every Tuesday and Wednesday afternoon from 1:00 pm until 4:00 pm. You do not need an appointment for the interview!  [facility] [address], [place], [place]   [place]  [place] Department  [place]  [phone]    Behavioral Health Resources in the Community: Intensive Outpatient Programs Organization         Address  Phone  Notes  [place]. [address] [place], [place] [phone]   [facility] Outpatient [address], [place], [place]   ADS: Alcohol & Drug Svcs [address], [place], [place]   [place] [address]  [place], [place] or [phone]   Substance Abuse Resources Organization         Address  Phone  Notes  Alcohol and Drug Services  [phone]   [place]  [phone]   [facility] [place]   Chinita Pester  [phone]   Residential & Outpatient Substance Abuse Program  [phone]   Psychological Services Organization         Address  Phone  Notes  [facility]  [place]  [phone]   [place] [address], [place] [phone] or [phone]    Mobile Crisis Teams Organization         Address  Phone  Notes  [facility], Mobile Crisis Care Unit  ([phone]   [facility]  [address] [place], [facility]   [facility] [address], [place] [phone]  Self-Help/Support Groups Organization         Address  Phone             Notes  [facility] of [place] - variety of support groups  [place] Call for more information  Narcotics Anonymous (NA), [facility] [address], [employer] [place]  2 meetings at this location   Special educational needs teacher         Address  Phone  Notes  [facility] Residential Treatment [place],    [place]  [phone]   [facility]  [address], [place] [phone], [place], [place]   [place], [place] [phone] Admissions: 8am-3pm M-F  Incentives Substance [place] [address]    [place], [place] [phone]   [facility] [address] [place], [place], [place]   [facility] [address]  [place], [place]   [facility] - Intensive Outpatient [address], [place], [place]   [facility] ([place].,  [place], [place] [phone] or [phone]   [facility] (RTS) [address], [place], [place] Accepts Medicaid  [facility] [address],  [place] [phone] Substance Abuse/Addiction Treatment   [facility] Organization         Address  Phone  Notes  [facility]  [phone]   Domenic Schwab, PhD [address] Arlis Porta [place], [place]   [phone] or [phone]    [place], [place] [phone]   [facility] [address], [place], [place] [phone] Insurance/Medicaid/sponsorship through [facility] [address], [address]                                    [place], [place] [phone] [place] [address] [place] [address], [place] [phone]    Dr. Adele Schilder  [phone]   [facility] Dept. 1) [address] [place] 2) [place] 3)  [place] [address], [place] [phone]  [phone]   [place] [phone] or [phone] (After Hours)

## 2015-02-10 NOTE — ED Notes (Signed)
Bed: WA06 Expected date:  Expected time:  Means of arrival:  Comments: EMS 27F AMS, ETOH

## 2015-02-10 NOTE — ED Notes (Signed)
Patient has not vomited since initial assessment. Pt is requesting something to eat and drink. Informed patient she could have any food and drink with prior episodes of vomiting.

## 2015-02-10 NOTE — ED Provider Notes (Signed)
CSN: [id number]     Arrival date & time 02/10/15  1943 History   First MD Initiated Contact with Patient 02/10/15 1952     Chief Complaint  Patient presents with  . Alcohol Intoxication  . Abdominal Pain     (Consider location/radiation/quality/duration/timing/severity/associated sxs/prior Treatment) HPI Comments: The patient is a 39 year old female, she presents from home by ambulance after she asked her husband to call the ambulance because of alcohol intoxication and abdominal pain. The patient is not a good historian, she is heavily intoxicated, the husband reports that she drank a full bottle of wine, she then became sick on her stomach. The patient was found on her couch, drowsy, alcohol on her breath, history of heavy alcohol use. The patient has not had vomiting diarrhea rashes swelling and no other complaints according to the paramedics.  Patient is a 39 y.o. female presenting with intoxication and abdominal pain. The history is provided by the patient.  Alcohol Intoxication Associated symptoms include abdominal pain.  Abdominal Pain   Past Medical History  Diagnosis Date  . Depression   . Pneumonia   . Cirrhosis   . Portal hypertension   . S/P alcohol detoxification     2-3 days at behavioral health previously  . Alcoholism   . Anxiety   . GERD (gastroesophageal reflux disease)   . 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 Menorrhagia   . Pancytopenia 01/15/2014  . UGI bleed 06/12/2014   Past Surgical History  Procedure Laterality Date  . Cholecystectomy    . Esophagogastroduodenoscopy N/A 06/12/2014    Procedure: ESOPHAGOGASTRODUODENOSCOPY (EGD);  Surgeon: Gatha Mayer, MD;  [facility];  Service: Endoscopy;  Laterality: N/A;  . Esophagogastroduodenoscopy (egd) with propofol N/A 07/29/2014    Procedure: ESOPHAGOGASTRODUODENOSCOPY (EGD) WITH PROPOFOL;  Surgeon: Inda Castle, MD;  Location[facility];  Service: Endoscopy;  Laterality: N/A;    Family History  Problem Relation Age of Onset  . Colon polyps Mother   . Hypertension Mother   . Thyroid disease Mother   . Alcoholism Mother   . Alcoholism Father   . Alcohol abuse Maternal Grandfather   . Alcohol abuse Paternal Grandfather   . Alcohol abuse Paternal Aunt   . Alcohol abuse Maternal Uncle    History  Substance Use Topics  . Smoking status: Never Smoker   . Smokeless tobacco: Never Used  . Alcohol Use: Yes     Comment: Reports drinking 3 times a week. Usually drinks one bottle of wine when drinking.    OB History    Gravida Para Term Preterm AB TAB SAB Ectopic Multiple Living            2     Review of Systems  Unable to perform ROS: Mental status change  Gastrointestinal: Positive for abdominal pain.      Allergies  Morphine and related; Morphine and related; and Nsaids  Home Medications   Prior to Admission medications   Medication Sig Start Date End Date Taking? Authorizing Provider  acamprosate (CAMPRAL) 333 MG tablet Take 2 tablets (666 mg total) by mouth 3 (three) times daily with meals. For alcoholism 12/25/14  Yes Shuvon B Rankin, NP  folic acid (FOLVITE) 1 MG tablet Take 1 tablet (1 mg total) by mouth daily. For folate deficiency 12/25/14  Yes Shuvon B Rankin, NP  Multiple Vitamin (MULTIVITAMIN WITH MINERALS) TABS tablet Take 1 tablet by mouth daily. For nutritional supplement 12/25/14  Yes Shuvon B Rankin, NP  pantoprazole (PROTONIX)  40 MG tablet Take 1 tablet (40 mg total) by mouth daily. For reflux 12/25/14  Yes Shuvon B Rankin, NP  famotidine (PEPCID) 20 MG tablet Take 1 tablet (20 mg total) by mouth 2 (two) times daily. 02/10/15   Noemi Chapel, MD  ferrous sulfate 325 (65 FE) MG tablet Take 1 tablet (325 mg total) by mouth daily. For iron deficiency Patient not taking: Reported on 02/10/2015 12/25/14   Shuvon B Rankin, NP  hydrOXYzine (ATARAX/VISTARIL) 25 MG tablet Take 1 tablet (25 mg total) by mouth 3 (three) times daily as needed for  anxiety. Patient not taking: Reported on 02/10/2015 12/25/14   Shuvon B Rankin, NP  nadolol (CORGARD) 40 MG tablet Take 1 tablet (40 mg total) by mouth daily. For hypertension Patient not taking: Reported on 02/10/2015 12/25/14   Shuvon B Rankin, NP  traZODone (DESYREL) 50 MG tablet Take 1 tablet (50 mg total) by mouth at bedtime as needed and may repeat dose one time if needed for sleep. Patient not taking: Reported on 02/10/2015 12/25/14   Shuvon B Rankin, NP   BP 102/59 mmHg  Pulse 98  Temp(Src) 98.2 F (36.8 C) (Oral)  Resp 18  Ht 5\' 4"  (1.626 m)  Wt 177 lb (80.287 kg)  BMI 30.37 kg/m2  SpO2 93%  LMP 02/02/2015 Physical Exam  Constitutional: She appears well-developed and well-nourished. No distress.  HENT:  Head: Normocephalic and atraumatic.  Mouth/Throat: Oropharynx is clear and moist. No oropharyngeal exudate.  Eyes: Conjunctivae and EOM are normal. Pupils are equal, round, and reactive to light. Right eye exhibits no discharge. Left eye exhibits no discharge. No scleral icterus.  Neck: Normal range of motion. Neck supple. No JVD present. No thyromegaly present.  Cardiovascular: Normal rate, regular rhythm, normal heart sounds and intact distal pulses.  Exam reveals no gallop and no friction rub.   No murmur heard. Pulmonary/Chest: Effort normal and breath sounds normal. No respiratory distress. She has no wheezes. She has no rales.  Abdominal: Soft. Bowel sounds are normal. She exhibits no distension and no mass. There is tenderness (minimal epigastric tenderness, no guarding, no Murphy sign, no tenderness at McBurney's point, no peritoneal signs).  Musculoskeletal: Normal range of motion. She exhibits no edema or tenderness.  Lymphadenopathy:    She has no cervical adenopathy.  Neurological: She is alert. Coordination normal.  Speech is slurred but understandable, follows all commands, normal coordination, able to sit up by herself in bed and take her coat off by herself.  Skin:  Skin is warm and dry. No rash noted. No erythema.  Psychiatric: She has a normal mood and affect. Her behavior is normal.  Nursing note and vitals reviewed.   ED Course  Procedures (including critical care time) Labs Review Labs Reviewed  ETHANOL - Abnormal; Notable for the following:    Alcohol, Ethyl (B) 385 (*)    All other components within normal limits  COMPREHENSIVE METABOLIC PANEL - Abnormal; Notable for the following:    Glucose, Bld 132 (*)    Creatinine, Ser 0.42 (*)    Calcium 8.1 (*)    AST 138 (*)    Total Bilirubin 1.6 (*)    All other components within normal limits  LIPASE, BLOOD - Abnormal; Notable for the following:    Lipase 80 (*)    All other components within normal limits  CBC WITH DIFFERENTIAL/PLATELET - Abnormal; Notable for the following:    RBC 3.41 (*)    Hemoglobin 10.7 (*)  HCT 33.9 (*)    RDW 17.5 (*)    Platelets 41 (*)    Neutrophils Relative % 41 (*)    Lymphocytes Relative 55 (*)    All other components within normal limits  URINALYSIS, ROUTINE W REFLEX MICROSCOPIC - Abnormal; Notable for the following:    Color, Urine AMBER (*)    Bilirubin Urine SMALL (*)    Ketones, ur 15 (*)    Protein, ur 100 (*)    All other components within normal limits  URINE MICROSCOPIC-ADD ON - Abnormal; Notable for the following:    Casts HYALINE CASTS (*)    All other components within normal limits  PREGNANCY, URINE    Imaging Review No results found.    MDM   Final diagnoses:  Alcohol abuse  Epigastric pain    The patient appears heavily intoxicated, she is sleepy but responsive to voice and follows commands, will need evaluation for possible pancreatitis, alcoholic gastritis, doubt surgical emergency, labs and vital signs, observation in the ED.  Pt reevaluated at 11:30 - minimal epigastric abd pain - no vomiting while here, fluids given, GI cocktail given, pt reassured that this is likely ETOH related - doubt pancreatitis - likely  alcoholic gastritic, pt states she doesn't want referral for detox =- resource list given.  Meds given in ED:  Medications  gi cocktail (Maalox,Lidocaine,Donnatal) (not administered)  sodium chloride 0.9 % bolus 1,000 mL (0 mLs Intravenous Stopped 02/10/15 2242)    New Prescriptions   FAMOTIDINE (PEPCID) 20 MG TABLET    Take 1 tablet (20 mg total) by mouth 2 (two) times daily.      Noemi Chapel, MD 02/10/15 2352

## 2015-02-10 NOTE — ED Notes (Signed)
Per EMS, patient's spouse returned home around 18:30-18:45 to find patient altered LOC. She wanted her spouse to call EMS since she felt sick. On arrival of EMS, patient was on the couch, drowsy, and ETOH on breath. Pt has as history of ETOH abuse. Today she drunk a bottle of wine and complaining of abd pain. She reported vomiting to EMS but would not answer how many times she had vomited.

## 2015-02-11 ENCOUNTER — Ambulatory Visit (INDEPENDENT_AMBULATORY_CARE_PROVIDER_SITE_OTHER): Payer: PRIVATE HEALTH INSURANCE | Admitting: Medical

## 2015-02-11 ENCOUNTER — Encounter: Payer: Self-pay | Admitting: Medical

## 2015-02-11 VITALS — BP 134/85 | HR 100 | Temp 98.2°F | Ht 63.0 in | Wt 178.8 lb

## 2015-02-11 DIAGNOSIS — K7030 Alcoholic cirrhosis of liver without ascites: Secondary | ICD-10-CM | POA: Diagnosis not present

## 2015-02-11 DIAGNOSIS — G4700 Insomnia, unspecified: Secondary | ICD-10-CM | POA: Insufficient documentation

## 2015-02-11 DIAGNOSIS — F32A Depression, unspecified: Secondary | ICD-10-CM

## 2015-02-11 DIAGNOSIS — F329 Major depressive disorder, single episode, unspecified: Secondary | ICD-10-CM | POA: Diagnosis not present

## 2015-02-11 DIAGNOSIS — F411 Generalized anxiety disorder: Secondary | ICD-10-CM | POA: Diagnosis not present

## 2015-02-11 MED ORDER — MIRTAZAPINE 7.5 MG PO TABS
7.5000 mg | ORAL_TABLET | Freq: Every day | ORAL | Status: DC
Start: 2015-02-11 — End: 2015-02-13

## 2015-02-11 MED ORDER — CLONAZEPAM 0.5 MG PO TABS: 0.5000 mg | ORAL_TABLET | Freq: Every day | ORAL | Status: DC

## 2015-02-11 NOTE — Assessment & Plan Note (Signed)
Pt will stop trazadone. Will rx Remeron.

## 2015-02-11 NOTE — Assessment & Plan Note (Signed)
Will get cmp,cbc, ggt, lipase today. Attend AA, see counselor card was given, and list of psychiatrist was also given. Please make appointments.

## 2015-02-11 NOTE — ED Notes (Signed)
Patient was awaiting for ride that she called while obtaining vital signs. When going back to bedside to check on patient she was no in the room. Went to lobby but didn't see patient. Pt is alert, oriented x 4, and appeared in distress.
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
## 2015-02-11 NOTE — Progress Notes (Signed)
Pre visit review using our clinic review tool, if applicable. No additional management support is needed unless otherwise documented below in the visit note. 

## 2015-02-11 NOTE — Patient Instructions (Addendum)
Cirrhosis with alcoholism Will get cmp,cbc, ggt, lipase today. Attend AA, see counselor card was given, and list of psychiatrist was also given. Please make appointments.   Depression Depression with anxiety and ptsd. Pt failed various meds.  Will rx very low dose clonzapam #4 only.(Anxiety at night or insomina.)  Will defer depression med tx to pshyciatrist.   Anxiety state Providing low dose and low number clonazapam. To use if needed for anxiety at night or insomnia.    Insomnia Pt will stop trazadone. Will rx Remeron.     Follow up in 2 weeks or as needed  Pt did not tell me she was in ED yesterday. Noticed this after she left reviewed notes. I stressed to her critical to stop alcohol. He pancrease could get more inflamed and she may end up being hospitlized. And the counseled again on liver failure as possible if she continue drinking alchohol.

## 2015-02-11 NOTE — Assessment & Plan Note (Signed)
Providing low dose and low number clonazapam. To use if needed for anxiety at night or insomnia.

## 2015-02-11 NOTE — Assessment & Plan Note (Signed)
Depression with anxiety and ptsd. Pt failed various meds.  Will rx very low dose clonzapam #4 only.(Anxiety at night or insomina.)  Will defer depression med tx to pshyciatrist.

## 2015-02-12 ENCOUNTER — Other Ambulatory Visit: Payer: Self-pay | Admitting: Family

## 2015-02-12 ENCOUNTER — Telehealth: Payer: Self-pay | Admitting: Hematology & Oncology

## 2015-02-12 ENCOUNTER — Telehealth: Payer: Self-pay | Admitting: Medical

## 2015-02-12 DIAGNOSIS — D696 Thrombocytopenia, unspecified: Secondary | ICD-10-CM

## 2015-02-12 DIAGNOSIS — K7030 Alcoholic cirrhosis of liver without ascites: Secondary | ICD-10-CM

## 2015-02-12 LAB — CBC WITH DIFFERENTIAL/PLATELET
BASOS PCT: 0.4 % (ref 0.0–3.0)
Basophils Absolute: 0 10*3/uL (ref 0.0–0.1)
Eosinophils Absolute: 0 10*3/uL (ref 0.0–0.7)
Eosinophils Relative: 0.4 % (ref 0.0–5.0)
HCT: 32.4 % — ABNORMAL LOW (ref 36.0–46.0)
HEMOGLOBIN: 10.9 g/dL — AB (ref 12.0–15.0)
LYMPHS PCT: 44.8 % (ref 12.0–46.0)
Lymphs Abs: 1.8 10*3/uL (ref 0.7–4.0)
MCHC: 33.7 g/dL (ref 30.0–36.0)
MCV: 94 fl (ref 78.0–100.0)
Monocytes Absolute: 0.2 10*3/uL (ref 0.1–1.0)
Monocytes Relative: 3.9 % (ref 3.0–12.0)
Neutro Abs: 2 10*3/uL (ref 1.4–7.7)
Neutrophils Relative %: 50.5 % (ref 43.0–77.0)
Platelets: 45 10*3/uL — CL (ref 150.0–400.0)
RBC: 3.45 Mil/uL — AB (ref 3.87–5.11)
RDW: 18.7 % — ABNORMAL HIGH (ref 11.5–15.5)
WBC: 4 10*3/uL (ref 4.0–10.5)

## 2015-02-12 LAB — COMPREHENSIVE METABOLIC PANEL
ALT: 34 U/L (ref 0–35)
AST: 145 U/L — ABNORMAL HIGH (ref 0–37)
Albumin: 3.6 g/dL (ref 3.5–5.2)
Alkaline Phosphatase: 109 U/L (ref 39–117)
BUN: 5 mg/dL — ABNORMAL LOW (ref 6–23)
CHLORIDE: 104 meq/L (ref 96–112)
CO2: 25 meq/L (ref 19–32)
Calcium: 8.6 mg/dL (ref 8.4–10.5)
Creatinine, Ser: 0.44 mg/dL (ref 0.40–1.20)
GFR: 170.28 mL/min (ref >60.00)
GLUCOSE: 94 mg/dL (ref 70–99)
Potassium: 3.9 mEq/L (ref 3.5–5.1)
Sodium: 139 mEq/L (ref 135–145)
Total Bilirubin: 2.1 mg/dL — ABNORMAL HIGH (ref 0.2–1.2)
Total Protein: 8.5 g/dL — ABNORMAL HIGH (ref 6.0–8.3)

## 2015-02-12 LAB — LIPASE: Lipase: 63 U/L — ABNORMAL HIGH (ref 11.0–59.0)

## 2015-02-12 LAB — GAMMA GT: GGT: 474 U/L — ABNORMAL HIGH (ref 7–51)

## 2015-02-12 NOTE — Telephone Encounter (Signed)
Patient called and was give apt date and time with instructions to bring medication bottles and current insurance

## 2015-02-12 NOTE — Telephone Encounter (Signed)
Claiborne Billings and I  both spoke w Sara [name] today to remind them of their appointment with Dr. Marin Olp. Also, advised them to bring all medication bottles and insurance card information.

## 2015-02-12 NOTE — Telephone Encounter (Signed)
Refer to Dr. Jonette Eva

## 2015-02-13 ENCOUNTER — Ambulatory Visit (HOSPITAL_BASED_OUTPATIENT_CLINIC_OR_DEPARTMENT_OTHER): Payer: PRIVATE HEALTH INSURANCE | Admitting: Family

## 2015-02-13 ENCOUNTER — Ambulatory Visit: Payer: PRIVATE HEALTH INSURANCE

## 2015-02-13 ENCOUNTER — Other Ambulatory Visit (HOSPITAL_BASED_OUTPATIENT_CLINIC_OR_DEPARTMENT_OTHER): Payer: PRIVATE HEALTH INSURANCE

## 2015-02-13 ENCOUNTER — Encounter: Payer: Self-pay | Admitting: Family

## 2015-02-13 VITALS — BP 133/80 | HR 99 | Temp 98.3°F | Resp 16 | Ht 63.0 in | Wt 183.0 lb

## 2015-02-13 DIAGNOSIS — K7030 Alcoholic cirrhosis of liver without ascites: Secondary | ICD-10-CM

## 2015-02-13 DIAGNOSIS — D6959 Other secondary thrombocytopenia: Secondary | ICD-10-CM | POA: Diagnosis not present

## 2015-02-13 DIAGNOSIS — D696 Thrombocytopenia, unspecified: Secondary | ICD-10-CM

## 2015-02-13 LAB — CBC WITH DIFFERENTIAL (CANCER CENTER ONLY)
BASO#: 0 10*3/uL (ref 0.0–0.2)
BASO%: 0.4 % (ref 0.0–2.0)
EOS%: 2.3 % (ref 0.0–7.0)
Eosinophils Absolute: 0.1 10*3/uL (ref 0.0–0.5)
HCT: 32.1 % — ABNORMAL LOW (ref 34.8–46.6)
HGB: 10.5 g/dL — ABNORMAL LOW (ref 11.6–15.9)
LYMPH#: 1.4 10*3/uL (ref 0.9–3.3)
LYMPH%: 50.8 % — ABNORMAL HIGH (ref 14.0–48.0)
MCH: 31.8 pg (ref 26.0–34.0)
MCHC: 32.7 g/dL (ref 32.0–36.0)
MCV: 97 fL (ref 81–101)
MONO#: 0.2 10*3/uL (ref 0.1–0.9)
MONO%: 7.1 % (ref 0.0–13.0)
NEUT#: 1.1 10*3/uL — ABNORMAL LOW (ref 1.5–6.5)
NEUT%: 39.4 % — ABNORMAL LOW (ref 39.6–80.0)
RBC: 3.3 10*6/uL — ABNORMAL LOW (ref 3.70–5.32)
RDW: 16.9 % — AB (ref 11.1–15.7)
WBC: 2.7 10*3/uL — AB (ref 3.9–10.0)

## 2015-02-13 LAB — LACTATE DEHYDROGENASE: LDH: 253 U/L — ABNORMAL HIGH (ref 94–250)

## 2015-02-13 LAB — CHCC SATELLITE - SMEAR

## 2015-02-13 MED ORDER — PHYTONADIONE 5 MG PO TABS: 2.5000 mg | ORAL_TABLET | Freq: Every day | ORAL | Status: DC
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
## 2015-02-19 ENCOUNTER — Telehealth: Payer: Self-pay | Admitting: *Deleted

## 2015-02-19 NOTE — Telephone Encounter (Signed)
PT SCHEDULED TO SEE DR Deatra Ina ON 5/27   IN THE OFFICE FIRST

## 2015-02-25 ENCOUNTER — Ambulatory Visit: Payer: PRIVATE HEALTH INSURANCE | Admitting: Medical

## 2015-02-25 DIAGNOSIS — Z0289 Encounter for other administrative examinations: Secondary | ICD-10-CM

## 2015-03-02 ENCOUNTER — Telehealth: Payer: Self-pay | Admitting: Medical

## 2015-03-02 ENCOUNTER — Encounter: Payer: Self-pay | Admitting: Medical

## 2015-03-02 NOTE — Telephone Encounter (Signed)
Pt was no show for appt on 02/25/15- letter sent- charge?

## 2015-03-02 NOTE — Telephone Encounter (Signed)
Can you call and see if has valid reason. Otherwise charge her.

## 2015-03-13 ENCOUNTER — Ambulatory Visit: Payer: PRIVATE HEALTH INSURANCE | Admitting: Hematology & Oncology

## 2015-03-13 ENCOUNTER — Other Ambulatory Visit: Payer: Self-pay

## 2015-03-16 ENCOUNTER — Telehealth: Payer: Self-pay | Admitting: Hematology & Oncology

## 2015-03-16 NOTE — Telephone Encounter (Signed)
Called to r/s 4-22 no show no answer and voice mail is full. I left message on husbands phone to have pt call.

## 2015-04-16 ENCOUNTER — Encounter: Payer: Self-pay | Admitting: Gastroenterology

## 2015-04-16 ENCOUNTER — Ambulatory Visit (INDEPENDENT_AMBULATORY_CARE_PROVIDER_SITE_OTHER): Payer: PRIVATE HEALTH INSURANCE | Admitting: Gastroenterology

## 2015-04-16 VITALS — BP 110/64 | HR 74 | Ht 63.0 in | Wt 164.8 lb

## 2015-04-16 DIAGNOSIS — Z23 Encounter for immunization: Secondary | ICD-10-CM | POA: Diagnosis not present

## 2015-04-16 DIAGNOSIS — I8500 Esophageal varices without bleeding: Secondary | ICD-10-CM | POA: Diagnosis not present

## 2015-04-16 DIAGNOSIS — K7030 Alcoholic cirrhosis of liver without ascites: Secondary | ICD-10-CM | POA: Diagnosis not present

## 2015-04-16 MED ORDER — PANTOPRAZOLE SODIUM 40 MG PO TBEC: 40.0000 mg | DELAYED_RELEASE_TABLET | Freq: Every day | ORAL | Status: DC

## 2015-04-16 NOTE — Patient Instructions (Signed)
You have been scheduled for your Endoscopy at [facility] instructions have been given  You was given your first Hep A/Hep B injection today You are scheduled for your 7 day injection on 04/23/2015 at 10am with a Nurse They will schedule you for your 21-30 day injection at that time

## 2015-04-16 NOTE — Assessment & Plan Note (Signed)
Patient has well established cirrhosis with hepatomegaly and portal hypertension.  She is alcohol-free for 60 days.  Plan on vaccinating for hepatitis a and B.

## 2015-04-16 NOTE — Progress Notes (Signed)
      History of Present Illness:  Sara [name] as returned for follow-up of cirrhosis and portal hypertension.  Last endoscopy in September, 2015 no varices were seen.  She went to alcohol rehabilitation in [place] but was dismissed because she had so diazepines in her system.  She has been alcohol-free for 60 days.  She has no GI complaints.    Review of Systems: Pertinent positive and negative review of systems were noted in the above HPI section. All other review of systems were otherwise negative.    Current Medications, Allergies, Past Medical History, Past Surgical History, Family History and Social History were reviewed in [facility] record  Vital signs were reviewed in today's medical record. Physical Exam: General: Well developed , well nourished, no acute distress Skin: anicteric.  There are multiple spiders on her chest Head: Normocephalic and atraumatic Eyes:  sclerae anicteric, EOMI Ears: Normal auditory acuity Mouth: No deformity or lesions Lymph Nodes: no lymphadenopathy Lungs: Clear throughout to auscultation Heart: Regular rate and rhythm; no murmurs, rubs or brui: Gastroinestinal:  Soft, non tender and non distended. No masses,hernias noted. Normal Bowel sounds.  Liver is palpable 5 fingerbreadths below the right costal margin Rectal:deferred Musculoskeletal: Symmetrical with no gross deformities  Pulses:  Normal pulses noted Extremities: No clubbing, cyanosis, edema or deformities noted Neurological: Alert oriented x 4, grossly nonfocal Psychological:  Alert and cooperative. Normal mood and affect  See Assessment and Plan under Problem List

## 2015-04-16 NOTE — Assessment & Plan Note (Signed)
Plan follow-up endoscopy.  The patient did not tolerate Corgard and discontinue this.

## 2015-04-23 ENCOUNTER — Ambulatory Visit (INDEPENDENT_AMBULATORY_CARE_PROVIDER_SITE_OTHER): Payer: PRIVATE HEALTH INSURANCE | Admitting: Gastroenterology

## 2015-04-23 DIAGNOSIS — I8500 Esophageal varices without bleeding: Secondary | ICD-10-CM | POA: Diagnosis not present

## 2015-04-23 DIAGNOSIS — Z23 Encounter for immunization: Secondary | ICD-10-CM | POA: Diagnosis not present

## 2015-04-23 DIAGNOSIS — K7030 Alcoholic cirrhosis of liver without ascites: Secondary | ICD-10-CM | POA: Diagnosis not present

## 2015-05-05 ENCOUNTER — Telehealth: Payer: Self-pay | Admitting: Gastroenterology

## 2015-05-05 NOTE — Telephone Encounter (Signed)
She is an accelarated schedule. Her first injection was 5/27, second injection was 6/2. Advised she is due her third injection no earlier than 6/16 and no later than 6/26. Patient is scheduled for 6/16.

## 2015-05-07 ENCOUNTER — Ambulatory Visit (INDEPENDENT_AMBULATORY_CARE_PROVIDER_SITE_OTHER): Payer: PRIVATE HEALTH INSURANCE | Admitting: Gastroenterology

## 2015-05-07 DIAGNOSIS — I8500 Esophageal varices without bleeding: Secondary | ICD-10-CM | POA: Diagnosis not present

## 2015-05-07 DIAGNOSIS — Z23 Encounter for immunization: Secondary | ICD-10-CM

## 2015-05-07 DIAGNOSIS — K7030 Alcoholic cirrhosis of liver without ascites: Secondary | ICD-10-CM | POA: Diagnosis not present

## 2015-05-08 ENCOUNTER — Ambulatory Visit (INDEPENDENT_AMBULATORY_CARE_PROVIDER_SITE_OTHER): Payer: PRIVATE HEALTH INSURANCE | Admitting: Medical

## 2015-05-08 ENCOUNTER — Other Ambulatory Visit (HOSPITAL_COMMUNITY)
Admission: RE | Admit: 2015-05-08 | Discharge: 2015-05-08 | Disposition: A | Discharge status: Home / Self Care | Payer: 59 | Source: Ambulatory Visit | Attending: Family Medicine | Admitting: Family Medicine

## 2015-05-08 ENCOUNTER — Encounter: Payer: Self-pay | Admitting: Medical

## 2015-05-08 VITALS — BP 123/73 | HR 91 | Temp 98.7°F | Ht 63.0 in | Wt 169.0 lb

## 2015-05-08 DIAGNOSIS — R82998 Other abnormal findings in urine: Secondary | ICD-10-CM

## 2015-05-08 DIAGNOSIS — Z124 Encounter for screening for malignant neoplasm of cervix: Secondary | ICD-10-CM

## 2015-05-08 DIAGNOSIS — Z87898 Personal history of other specified conditions: Secondary | ICD-10-CM | POA: Diagnosis not present

## 2015-05-08 DIAGNOSIS — N390 Urinary tract infection, site not specified: Secondary | ICD-10-CM

## 2015-05-08 DIAGNOSIS — Z0000 Encounter for general adult medical examination without abnormal findings: Secondary | ICD-10-CM

## 2015-05-08 DIAGNOSIS — Z01419 Encounter for gynecological examination (general) (routine) without abnormal findings: Secondary | ICD-10-CM | POA: Diagnosis present

## 2015-05-08 LAB — POCT URINALYSIS DIPSTICK
Bilirubin, UA: NEGATIVE
Glucose, UA: NEGATIVE
Ketones, UA: NEGATIVE
Nitrite, UA: NEGATIVE
Protein, UA: NEGATIVE
RBC UA: NEGATIVE
Spec Grav, UA: <=1.005
UROBILINOGEN UA: 0.2
pH, UA: 6.5

## 2015-05-08 LAB — COMPREHENSIVE METABOLIC PANEL
ALT: 25 U/L (ref 0–35)
AST: 75 U/L — ABNORMAL HIGH (ref 0–37)
Albumin: 3.8 g/dL (ref 3.5–5.2)
Alkaline Phosphatase: 132 U/L — ABNORMAL HIGH (ref 39–117)
BILIRUBIN TOTAL: 1.4 mg/dL — AB (ref 0.2–1.2)
BUN: 7 mg/dL (ref 6–23)
CO2: 26 meq/L (ref 19–32)
CREATININE: 0.38 mg/dL — AB (ref 0.40–1.20)
Calcium: 8.8 mg/dL (ref 8.4–10.5)
Chloride: 105 mEq/L (ref 96–112)
GFR: 201.42 mL/min (ref >60.00)
Glucose, Bld: 105 mg/dL — ABNORMAL HIGH (ref 70–99)
Potassium: 3.8 mEq/L (ref 3.5–5.1)
Sodium: 138 mEq/L (ref 135–145)
Total Protein: 8 g/dL (ref 6.0–8.3)

## 2015-05-08 LAB — CBC WITH DIFFERENTIAL/PLATELET
BASOS ABS: 0 10*3/uL (ref 0.0–0.1)
Basophils Relative: 0.6 % (ref 0.0–3.0)
Eosinophils Absolute: 0 10*3/uL (ref 0.0–0.7)
Eosinophils Relative: 0.6 % (ref 0.0–5.0)
HCT: 31.4 % — ABNORMAL LOW (ref 36.0–46.0)
Hemoglobin: 10.5 g/dL — ABNORMAL LOW (ref 12.0–15.0)
LYMPHS ABS: 1.6 10*3/uL (ref 0.7–4.0)
Lymphocytes Relative: 50.1 % — ABNORMAL HIGH (ref 12.0–46.0)
MCHC: 33.5 g/dL (ref 30.0–36.0)
MCV: 90.5 fl (ref 78.0–100.0)
Monocytes Absolute: 0.2 10*3/uL (ref 0.1–1.0)
Monocytes Relative: 7 % (ref 3.0–12.0)
NEUTROS ABS: 1.3 10*3/uL — AB (ref 1.4–7.7)
NEUTROS PCT: 41.7 % — AB (ref 43.0–77.0)
PLATELETS: 64 10*3/uL — AB (ref 150.0–400.0)
RBC: 3.48 Mil/uL — ABNORMAL LOW (ref 3.87–5.11)
RDW: 19.2 % — ABNORMAL HIGH (ref 11.5–15.5)
WBC: 3.2 10*3/uL — AB (ref 4.0–10.5)

## 2015-05-08 LAB — LIPID PANEL
CHOLESTEROL: 219 mg/dL — AB (ref 0–200)
HDL: 60.4 mg/dL (ref >39.00)
LDL CALC: 123 mg/dL — AB (ref 0–99)
NonHDL: 158.6
TRIGLYCERIDES: 176 mg/dL — AB (ref 0.0–149.0)
Total CHOL/HDL Ratio: 4
VLDL: 35.2 mg/dL (ref 0.0–40.0)

## 2015-05-08 LAB — TSH: TSH: 1.51 u[IU]/mL (ref 0.35–4.50)

## 2015-05-08 MED ORDER — FLUTICASONE PROPIONATE 50 MCG/ACT NA SUSP: 2.0000 | Freq: Every day | NASAL | Status: DC

## 2015-05-08 NOTE — Patient Instructions (Addendum)
Wellness examination Cbc, cmp, tsh, lipid, ua, papsmear done today. Future order mammogram for November.  For general health please stop drinking any alcohohol at all.    For your nasal congestion I will rx flonase since may have recent allergies. On ros. Regarding her faint subjective weakness of rt lower ext. I don't appreciate this on exam. No back pain associated. Would like you monitor this and schedule appointment for 3 wks or as needed if worsens prior.Any new associated and severe symptoms then ED evaluate. Decided not to address today for 2 reasons. Clinically hard to appreciate and second reason wellness exam today.  Follow up in 3 wk or as needed.  Preventive Care for Adults A healthy lifestyle and preventive care can promote health and wellness. Preventive health guidelines for women include the following key practices.  A routine yearly physical is a good way to check with your health care provider about your health and preventive screening. It is a chance to share any concerns and updates on your health and to receive a thorough exam.  Visit your dentist for a routine exam and preventive care every 6 months. Brush your teeth twice a day and floss once a day. Good oral hygiene prevents tooth decay and gum disease.  The frequency of eye exams is based on your age, health, family medical history, use of contact lenses, and other factors. Follow your health care provider's recommendations for frequency of eye exams.  Eat a healthy diet. Foods like vegetables, fruits, whole grains, low-fat dairy products, and lean protein foods contain the nutrients you need without too many calories. Decrease your intake of foods high in solid fats, added sugars, and salt. Eat the right amount of calories for you.Get information about a proper diet from your health care provider, if necessary.  Regular physical exercise is one of the most important things you can do for your health. Most adults  should get at least 150 minutes of moderate-intensity exercise (any activity that increases your heart rate and causes you to sweat) each week. In addition, most adults need muscle-strengthening exercises on 2 or more days a week.  Maintain a healthy weight. The body mass index (BMI) is a screening tool to identify possible weight problems. It provides an estimate of body fat based on height and weight. Your health care provider can find your BMI and can help you achieve or maintain a healthy weight.For adults 20 years and older:  A BMI below 18.5 is considered underweight.  A BMI of 18.5 to 24.9 is normal.  A BMI of 25 to 29.9 is considered overweight.  A BMI of 30 and above is considered obese.  Maintain normal blood lipids and cholesterol levels by exercising and minimizing your intake of saturated fat. Eat a balanced diet with plenty of fruit and vegetables. Blood tests for lipids and cholesterol should begin at age 53 and be repeated every 5 years. If your lipid or cholesterol levels are high, you are over 50, or you are at high risk for heart disease, you may need your cholesterol levels checked more frequently.Ongoing high lipid and cholesterol levels should be treated with medicines if diet and exercise are not working.  If you smoke, find out from your health care provider how to quit. If you do not use tobacco, do not start.  Lung cancer screening is recommended for adults aged 70-80 years who are at high risk for developing lung cancer because of a history of smoking. A yearly low-dose  CT scan of the lungs is recommended for people who have at least a 30-pack-year history of smoking and are a current smoker or have quit within the past 15 years. A pack year of smoking is smoking an average of 1 pack of cigarettes a day for 1 year (for example: 1 pack a day for 30 years or 2 packs a day for 15 years). Yearly screening should continue until the smoker has stopped smoking for at least 15  years. Yearly screening should be stopped for people who develop a health problem that would prevent them from having lung cancer treatment.  If you are pregnant, do not drink alcohol. If you are breastfeeding, be very cautious about drinking alcohol. If you are not pregnant and choose to drink alcohol, do not have more than 1 drink per day. One drink is considered to be 12 ounces (355 mL) of beer, 5 ounces (148 mL) of wine, or 1.5 ounces (44 mL) of liquor.  Avoid use of street drugs. Do not share needles with anyone. Ask for help if you need support or instructions about stopping the use of drugs.  High blood pressure causes heart disease and increases the risk of stroke. Your blood pressure should be checked at least every 1 to 2 years. Ongoing high blood pressure should be treated with medicines if weight loss and exercise do not work.  If you are 20-45 years old, ask your health care provider if you should take aspirin to prevent strokes.  Diabetes screening involves taking a blood sample to check your fasting blood sugar level. This should be done once every 3 years, after age 41, if you are within normal weight and without risk factors for diabetes. Testing should be considered at a younger age or be carried out more frequently if you are overweight and have at least 1 risk factor for diabetes.  Breast cancer screening is essential preventive care for women. You should practice "breast self-awareness." This means understanding the normal appearance and feel of your breasts and may include breast self-examination. Any changes detected, no matter how small, should be reported to a health care provider. Women in their 67s and 30s should have a clinical breast exam (CBE) by a health care provider as part of a regular health exam every 1 to 3 years. After age 77, women should have a CBE every year. Starting at age 92, women should consider having a mammogram (breast X-ray test) every year. Women who  have a family history of breast cancer should talk to their health care provider about genetic screening. Women at a high risk of breast cancer should talk to their health care providers about having an MRI and a mammogram every year.  Breast cancer gene (BRCA)-related cancer risk assessment is recommended for women who have family members with BRCA-related cancers. BRCA-related cancers include breast, ovarian, tubal, and peritoneal cancers. Having family members with these cancers may be associated with an increased risk for harmful changes (mutations) in the breast cancer genes BRCA1 and BRCA2. Results of the assessment will determine the need for genetic counseling and BRCA1 and BRCA2 testing.  Routine pelvic exams to screen for cancer are no longer recommended for nonpregnant women who are considered low risk for cancer of the pelvic organs (ovaries, uterus, and vagina) and who do not have symptoms. Ask your health care provider if a screening pelvic exam is right for you.  If you have had past treatment for cervical cancer or a condition that  could lead to cancer, you need Pap tests and screening for cancer for at least 20 years after your treatment. If Pap tests have been discontinued, your risk factors (such as having a new sexual partner) need to be reassessed to determine if screening should be resumed. Some women have medical problems that increase the chance of getting cervical cancer. In these cases, your health care provider may recommend more frequent screening and Pap tests.  The HPV test is an additional test that may be used for cervical cancer screening. The HPV test looks for the virus that can cause the cell changes on the cervix. The cells collected during the Pap test can be tested for HPV. The HPV test could be used to screen women aged 59 years and older, and should be used in women of any age who have unclear Pap test results. After the age of 27, women should have HPV testing at the  same frequency as a Pap test.  Colorectal cancer can be detected and often prevented. Most routine colorectal cancer screening begins at the age of 64 years and continues through age 29 years. However, your health care provider may recommend screening at an earlier age if you have risk factors for colon cancer. On a yearly basis, your health care provider may provide home test kits to check for hidden blood in the stool. Use of a small camera at the end of a tube, to directly examine the colon (sigmoidoscopy or colonoscopy), can detect the earliest forms of colorectal cancer. Talk to your health care provider about this at age 29, when routine screening begins. Direct exam of the colon should be repeated every 5-10 years through age 103 years, unless early forms of pre-cancerous polyps or small growths are found.  People who are at an increased risk for hepatitis B should be screened for this virus. You are considered at high risk for hepatitis B if:  You were born in a country where hepatitis B occurs often. Talk with your health care provider about which countries are considered high risk.  Your parents were born in a high-risk country and you have not received a shot to protect against hepatitis B (hepatitis B vaccine).  You have HIV or AIDS.  You use needles to inject street drugs.  You live with, or have sex with, someone who has hepatitis B.  You get hemodialysis treatment.  You take certain medicines for conditions like cancer, organ transplantation, and autoimmune conditions.  Hepatitis C blood testing is recommended for all people born from [birth year] through 1965 and any individual with known risks for hepatitis C.  Practice safe sex. Use condoms and avoid high-risk sexual practices to reduce the spread of sexually transmitted infections (STIs). STIs include gonorrhea, chlamydia, syphilis, trichomonas, herpes, HPV, and human immunodeficiency virus (HIV). Herpes, HIV, and HPV are viral  illnesses that have no cure. They can result in disability, cancer, and death.  You should be screened for sexually transmitted illnesses (STIs) including gonorrhea and chlamydia if:  You are sexually active and are younger than 24 years.  You are older than 24 years and your health care provider tells you that you are at risk for this type of infection.  Your sexual activity has changed since you were last screened and you are at an increased risk for chlamydia or gonorrhea. Ask your health care provider if you are at risk.  If you are at risk of being infected with HIV, it is recommended that you  take a prescription medicine daily to prevent HIV infection. This is called preexposure prophylaxis (PrEP). You are considered at risk if:  You are a heterosexual woman, are sexually active, and are at increased risk for HIV infection.  You take drugs by injection.  You are sexually active with a partner who has HIV.  Talk with your health care provider about whether you are at high risk of being infected with HIV. If you choose to begin PrEP, you should first be tested for HIV. You should then be tested every 3 months for as long as you are taking PrEP.  Osteoporosis is a disease in which the bones lose minerals and strength with aging. This can result in serious bone fractures or breaks. The risk of osteoporosis can be identified using a bone density scan. Women ages 39 years and over and women at risk for fractures or osteoporosis should discuss screening with their health care providers. Ask your health care provider whether you should take a calcium supplement or vitamin D to reduce the rate of osteoporosis.  Menopause can be associated with physical symptoms and risks. Hormone replacement therapy is available to decrease symptoms and risks. You should talk to your health care provider about whether hormone replacement therapy is right for you.  Use sunscreen. Apply sunscreen liberally and  repeatedly throughout the day. You should seek shade when your shadow is shorter than you. Protect yourself by wearing long sleeves, pants, a wide-brimmed hat, and sunglasses year round, whenever you are outdoors.  Once a month, do a whole body skin exam, using a mirror to look at the skin on your back. Tell your health care provider of new moles, moles that have irregular borders, moles that are larger than a pencil eraser, or moles that have changed in shape or color.  Stay current with required vaccines (immunizations).  Influenza vaccine. All adults should be immunized every year.  Tetanus, diphtheria, and acellular pertussis (Td, Tdap) vaccine. Pregnant women should receive 1 dose of Tdap vaccine during each pregnancy. The dose should be obtained regardless of the length of time since the last dose. Immunization is preferred during the 27th-36th week of gestation. An adult who has not previously received Tdap or who does not know her vaccine status should receive 1 dose of Tdap. This initial dose should be followed by tetanus and diphtheria toxoids (Td) booster doses every 10 years. Adults with an unknown or incomplete history of completing a 3-dose immunization series with Td-containing vaccines should begin or complete a primary immunization series including a Tdap dose. Adults should receive a Td booster every 10 years.  Varicella vaccine. An adult without evidence of immunity to varicella should receive 2 doses or a second dose if she has previously received 1 dose. Pregnant females who do not have evidence of immunity should receive the first dose after pregnancy. This first dose should be obtained before leaving the health care facility. The second dose should be obtained 4-8 weeks after the first dose.  Human papillomavirus (HPV) vaccine. Females aged 13-26 years who have not received the vaccine previously should obtain the 3-dose series. The vaccine is not recommended for use in pregnant  females. However, pregnancy testing is not needed before receiving a dose. If a female is found to be pregnant after receiving a dose, no treatment is needed. In that case, the remaining doses should be delayed until after the pregnancy. Immunization is recommended for any person with an immunocompromised condition through the age of  26 years if she did not get any or all doses earlier. During the 3-dose series, the second dose should be obtained 4-8 weeks after the first dose. The third dose should be obtained 24 weeks after the first dose and 16 weeks after the second dose.  Zoster vaccine. One dose is recommended for adults aged 61 years or older unless certain conditions are present.  Measles, mumps, and rubella (MMR) vaccine. Adults born before 18 generally are considered immune to measles and mumps. Adults born in [birth year] or later should have 1 or more doses of MMR vaccine unless there is a contraindication to the vaccine or there is laboratory evidence of immunity to each of the three diseases. A routine second dose of MMR vaccine should be obtained at least 28 days after the first dose for students attending postsecondary schools, health care workers, or international travelers. People who received inactivated measles vaccine or an unknown type of measles vaccine during 1963-1967 should receive 2 doses of MMR vaccine. People who received inactivated mumps vaccine or an unknown type of mumps vaccine before 1979 and are at high risk for mumps infection should consider immunization with 2 doses of MMR vaccine. For females of childbearing age, rubella immunity should be determined. If there is no evidence of immunity, females who are not pregnant should be vaccinated. If there is no evidence of immunity, females who are pregnant should delay immunization until after pregnancy. Unvaccinated health care workers born before [birth year] who lack laboratory evidence of measles, mumps, or rubella immunity or laboratory  confirmation of disease should consider measles and mumps immunization with 2 doses of MMR vaccine or rubella immunization with 1 dose of MMR vaccine.  Pneumococcal 13-valent conjugate (PCV13) vaccine. When indicated, a person who is uncertain of her immunization history and has no record of immunization should receive the PCV13 vaccine. An adult aged 46 years or older who has certain medical conditions and has not been previously immunized should receive 1 dose of PCV13 vaccine. This PCV13 should be followed with a dose of pneumococcal polysaccharide (PPSV23) vaccine. The PPSV23 vaccine dose should be obtained at least 8 weeks after the dose of PCV13 vaccine. An adult aged 43 years or older who has certain medical conditions and previously received 1 or more doses of PPSV23 vaccine should receive 1 dose of PCV13. The PCV13 vaccine dose should be obtained 1 or more years after the last PPSV23 vaccine dose.  Pneumococcal polysaccharide (PPSV23) vaccine. When PCV13 is also indicated, PCV13 should be obtained first. All adults aged 8 years and older should be immunized. An adult younger than age 44 years who has certain medical conditions should be immunized. Any person who resides in a nursing home or long-term care facility should be immunized. An adult smoker should be immunized. People with an immunocompromised condition and certain other conditions should receive both PCV13 and PPSV23 vaccines. People with human immunodeficiency virus (HIV) infection should be immunized as soon as possible after diagnosis. Immunization during chemotherapy or radiation therapy should be avoided. Routine use of PPSV23 vaccine is not recommended for American Indians, [place] Natives, or people younger than 65 years unless there are medical conditions that require PPSV23 vaccine. When indicated, people who have unknown immunization and have no record of immunization should receive PPSV23 vaccine. One-time revaccination 5 years  after the first dose of PPSV23 is recommended for people aged 19-64 years who have chronic kidney failure, nephrotic syndrome, asplenia, or immunocompromised conditions. People who received 1-2 doses of  PPSV23 before age 65 years should receive another dose of PPSV23 vaccine at age 35 years or later if at least 5 years have passed since the previous dose. Doses of PPSV23 are not needed for people immunized with PPSV23 at or after age 11 years.  Meningococcal vaccine. Adults with asplenia or persistent complement component deficiencies should receive 2 doses of quadrivalent meningococcal conjugate (MenACWY-D) vaccine. The doses should be obtained at least 2 months apart. Microbiologists working with certain meningococcal bacteria, [employer] recruits, people at risk during an outbreak, and people who travel to or live in countries with a high rate of meningitis should be immunized. A first-year college student up through age 22 years who is living in a residence hall should receive a dose if she did not receive a dose on or after her 16th birthday. Adults who have certain high-risk conditions should receive one or more doses of vaccine.  Hepatitis A vaccine. Adults who wish to be protected from this disease, have certain high-risk conditions, work with hepatitis A-infected animals, work in hepatitis A research labs, or travel to or work in countries with a high rate of hepatitis A should be immunized. Adults who were previously unvaccinated and who anticipate close contact with an international adoptee during the first 60 days after arrival in the [place] from a country with a high rate of hepatitis A should be immunized.  Hepatitis B vaccine. Adults who wish to be protected from this disease, have certain high-risk conditions, may be exposed to blood or other infectious body fluids, are household contacts or sex partners of hepatitis B positive people, are clients or workers in certain care facilities, or  travel to or work in countries with a high rate of hepatitis B should be immunized.  Haemophilus influenzae type b (Hib) vaccine. A previously unvaccinated person with asplenia or sickle cell disease or having a scheduled splenectomy should receive 1 dose of Hib vaccine. Regardless of previous immunization, a recipient of a hematopoietic stem cell transplant should receive a 3-dose series 6-12 months after her successful transplant. Hib vaccine is not recommended for adults with HIV infection. Preventive Services / Frequency Ages 80 to 10 years  Blood pressure check.** / Every 1 to 2 years.  Lipid and cholesterol check.** / Every 5 years beginning at age 38.  Clinical breast exam.** / Every 3 years for women in their 20s and 33s.  BRCA-related cancer risk assessment.** / For women who have family members with a BRCA-related cancer (breast, ovarian, tubal, or peritoneal cancers).  Pap test.** / Every 2 years from ages 56 through 55. Every 3 years starting at age 56 through age 33 or 43 with a history of 3 consecutive normal Pap tests.  HPV screening.** / Every 3 years from ages 61 through ages 40 to 53 with a history of 3 consecutive normal Pap tests.  Hepatitis C blood test.** / For any individual with known risks for hepatitis C.  Skin self-exam. / Monthly.  Influenza vaccine. / Every year.  Tetanus, diphtheria, and acellular pertussis (Tdap, Td) vaccine.** / Consult your health care provider. Pregnant women should receive 1 dose of Tdap vaccine during each pregnancy. 1 dose of Td every 10 years.  Varicella vaccine.** / Consult your health care provider. Pregnant females who do not have evidence of immunity should receive the first dose after pregnancy.  HPV vaccine. / 3 doses over 6 months, if 48 and younger. The vaccine is not recommended for use in pregnant females. However,  pregnancy testing is not needed before receiving a dose.  Measles, mumps, rubella (MMR) vaccine.** / You need  at least 1 dose of MMR if you were born in 1957 or later. You may also need a 2nd dose. For females of childbearing age, rubella immunity should be determined. If there is no evidence of immunity, females who are not pregnant should be vaccinated. If there is no evidence of immunity, females who are pregnant should delay immunization until after pregnancy.  Pneumococcal 13-valent conjugate (PCV13) vaccine.** / Consult your health care provider.  Pneumococcal polysaccharide (PPSV23) vaccine.** / 1 to 2 doses if you smoke cigarettes or if you have certain conditions.  Meningococcal vaccine.** / 1 dose if you are age 73 to 50 years and a Market researcher living in a residence hall, or have one of several medical conditions, you need to get vaccinated against meningococcal disease. You may also need additional booster doses.  Hepatitis A vaccine.** / Consult your health care provider.  Hepatitis B vaccine.** / Consult your health care provider.  Haemophilus influenzae type b (Hib) vaccine.** / Consult your health care provider. Ages 37 to 82 years  Blood pressure check.** / Every 1 to 2 years.  Lipid and cholesterol check.** / Every 5 years beginning at age 28 years.  Lung cancer screening. / Every year if you are aged 66-80 years and have a 30-pack-year history of smoking and currently smoke or have quit within the past 15 years. Yearly screening is stopped once you have quit smoking for at least 15 years or develop a health problem that would prevent you from having lung cancer treatment.  Clinical breast exam.** / Every year after age 27 years.  BRCA-related cancer risk assessment.** / For women who have family members with a BRCA-related cancer (breast, ovarian, tubal, or peritoneal cancers).  Mammogram.** / Every year beginning at age 38 years and continuing for as long as you are in good health. Consult with your health care provider.  Pap test.** / Every 3 years starting at  age 33 years through age 26 or 14 years with a history of 3 consecutive normal Pap tests.  HPV screening.** / Every 3 years from ages 2 years through ages 1 to 19 years with a history of 3 consecutive normal Pap tests.  Fecal occult blood test (FOBT) of stool. / Every year beginning at age 27 years and continuing until age 30 years. You may not need to do this test if you get a colonoscopy every 10 years.  Flexible sigmoidoscopy or colonoscopy.** / Every 5 years for a flexible sigmoidoscopy or every 10 years for a colonoscopy beginning at age 73 years and continuing until age 19 years.  Hepatitis C blood test.** / For all people born from [birth year] through 1964 and any individual with known risks for hepatitis C.  Skin self-exam. / Monthly.  Influenza vaccine. / Every year.  Tetanus, diphtheria, and acellular pertussis (Tdap/Td) vaccine.** / Consult your health care provider. Pregnant women should receive 1 dose of Tdap vaccine during each pregnancy. 1 dose of Td every 10 years.  Varicella vaccine.** / Consult your health care provider. Pregnant females who do not have evidence of immunity should receive the first dose after pregnancy.  Zoster vaccine.** / 1 dose for adults aged 75 years or older.  Measles, mumps, rubella (MMR) vaccine.** / You need at least 1 dose of MMR if you were born in 1957 or later. You may also need a 2nd dose. For  females of childbearing age, rubella immunity should be determined. If there is no evidence of immunity, females who are not pregnant should be vaccinated. If there is no evidence of immunity, females who are pregnant should delay immunization until after pregnancy.  Pneumococcal 13-valent conjugate (PCV13) vaccine.** / Consult your health care provider.  Pneumococcal polysaccharide (PPSV23) vaccine.** / 1 to 2 doses if you smoke cigarettes or if you have certain conditions.  Meningococcal vaccine.** / Consult your health care provider.  Hepatitis A  vaccine.** / Consult your health care provider.  Hepatitis B vaccine.** / Consult your health care provider.  Haemophilus influenzae type b (Hib) vaccine.** / Consult your health care provider. Ages 85 years and over  Blood pressure check.** / Every 1 to 2 years.  Lipid and cholesterol check.** / Every 5 years beginning at age 34 years.  Lung cancer screening. / Every year if you are aged 27-80 years and have a 30-pack-year history of smoking and currently smoke or have quit within the past 15 years. Yearly screening is stopped once you have quit smoking for at least 15 years or develop a health problem that would prevent you from having lung cancer treatment.  Clinical breast exam.** / Every year after age 36 years.  BRCA-related cancer risk assessment.** / For women who have family members with a BRCA-related cancer (breast, ovarian, tubal, or peritoneal cancers).  Mammogram.** / Every year beginning at age 4 years and continuing for as long as you are in good health. Consult with your health care provider.  Pap test.** / Every 3 years starting at age 46 years through age 55 or 6 years with 3 consecutive normal Pap tests. Testing can be stopped between 65 and 70 years with 3 consecutive normal Pap tests and no abnormal Pap or HPV tests in the past 10 years.  HPV screening.** / Every 3 years from ages 53 years through ages 56 or 41 years with a history of 3 consecutive normal Pap tests. Testing can be stopped between 65 and 70 years with 3 consecutive normal Pap tests and no abnormal Pap or HPV tests in the past 10 years.  Fecal occult blood test (FOBT) of stool. / Every year beginning at age 72 years and continuing until age 66 years. You may not need to do this test if you get a colonoscopy every 10 years.  Flexible sigmoidoscopy or colonoscopy.** / Every 5 years for a flexible sigmoidoscopy or every 10 years for a colonoscopy beginning at age 94 years and continuing until age 45  years.  Hepatitis C blood test.** / For all people born from [birth year] through 1964 and any individual with known risks for hepatitis C.  Osteoporosis screening.** / A one-time screening for women ages 70 years and over and women at risk for fractures or osteoporosis.  Skin self-exam. / Monthly.  Influenza vaccine. / Every year.  Tetanus, diphtheria, and acellular pertussis (Tdap/Td) vaccine.** / 1 dose of Td every 10 years.  Varicella vaccine.** / Consult your health care provider.  Zoster vaccine.** / 1 dose for adults aged 45 years or older.  Pneumococcal 13-valent conjugate (PCV13) vaccine.** / Consult your health care provider.  Pneumococcal polysaccharide (PPSV23) vaccine.** / 1 dose for all adults aged 95 years and older.  Meningococcal vaccine.** / Consult your health care provider.  Hepatitis A vaccine.** / Consult your health care provider.  Hepatitis B vaccine.** / Consult your health care provider.  Haemophilus influenzae type b (Hib) vaccine.** / Consult your health  your health care provider. ** Family history and personal history of risk and conditions may change your health care provider's recommendations. Document Released: 01/03/2002 Document Revised: 03/24/2014 Document Reviewed: 04/04/2011 ExitCare Patient Information 2015 [employer]. This information is not intended to replace advice given to you by your health care provider. Make sure you discuss any questions you have with your health care provider.   

## 2015-05-08 NOTE — Progress Notes (Signed)
Subjective:    Patient ID: Sara [name], female    DOB: Feb 22, 1976, 39 y.o.   MRN: [id number]  HPI  I have reviewed pt PMH, PSH, FH, Social History and Surgical History  Pt had stopped drinking for short time but she admits that she started again.  Pt has been doing some Yoga and Pilates except not recently when she had pnd., NO caffeine beverages.   Pt has had pap smear. She states less than 3 years but then also states it always come back abnormal. Pt ok with it getting done today.  Tdap. Up to date.  Pt recently had Hep A and Hep B vaccine.  No family history of breast cancer at young age. Pt had mammogram done May and biospy was done. Told to repeat that in 6 months.   Pt is fasting.  LMP- Apr 06, 2015. Indicates no chance of pregnancy.    Review of Systems  Constitutional: Negative for fever, chills and fatigue.  HENT: Positive for congestion and postnasal drip. Negative for ear discharge, ear pain, sinus pressure, sneezing, sore throat and trouble swallowing.   Eyes: Negative for pain, discharge and redness.  Respiratory: Positive for cough. Negative for chest tightness, shortness of breath and wheezing.   Cardiovascular: Negative for chest pain and palpitations.  Gastrointestinal: Negative for abdominal pain.  Musculoskeletal: Negative for back pain and neck pain.       States mild occasional sensation of weakness of rt lower leg. 3 days ago. No lower back pain.  Neurological: Negative for dizziness, speech difficulty, weakness and light-headedness.  Hematological: Negative for adenopathy. Does not bruise/bleed easily.       No report of any bleeding.  Psychiatric/Behavioral: Negative for suicidal ideas, behavioral problems, dysphoric mood and decreased concentration. The patient is not nervous/anxious.     Past Medical History  Diagnosis Date  . Depression   . Pneumonia   . Cirrhosis   . Portal hypertension   . S/P alcohol detoxification     2-3 days at  behavioral health previously  . Alcoholism   . Anxiety   . GERD (gastroesophageal reflux disease)   . 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 Menorrhagia   . Pancytopenia 01/15/2014  . UGI bleed 06/12/2014  . Blood transfusion without reported diagnosis   . Heart murmur     Patient states she may have    History   Social History  . Marital Status: Married    Spouse Name: N/A  . Number of Children: 2  . Years of Education: N/A   Occupational History  . Paralegal    Social History Main Topics  . Smoking status: Former Smoker -- 0.25 packs/day for .5 years    Types: Cigarettes  . Smokeless tobacco: Never Used     Comment: smokes 3 times a week  . Alcohol Use: 0.0 oz/week    0 Standard drinks or equivalent per week     Comment: Reports drinking 4 times a week. Usually drinks 2 bottle of wine when drinking.   . Drug Use: No     Comment: 11/05/14 - pt states she did this approx. March 2014  . Sexual Activity: Not on file   Other Topics Concern  . Not on file   Social History Narrative   Lives with husband and 2 children. 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    Past Surgical History  Procedure Laterality Date  . Cholecystectomy    . Esophagogastroduodenoscopy  N/A 06/12/2014    Procedure: ESOPHAGOGASTRODUODENOSCOPY (EGD);  Surgeon: Gatha Mayer, MD;  Location: [facility];  Service: Endoscopy;  Laterality: N/A;  . Esophagogastroduodenoscopy (egd) with propofol N/A 07/29/2014    Procedure: ESOPHAGOGASTRODUODENOSCOPY (EGD) WITH PROPOFOL;  Surgeon: Inda Castle, MD;  Location: [facility];  Service: Endoscopy;  Laterality: N/A;    Family History  Problem Relation Age of Onset  . Colon polyps Mother   . Hypertension Mother   . Thyroid disease Mother   . Alcoholism Mother   . Alcoholism Father   . Alcohol abuse Maternal Grandfather   . Alcohol abuse Paternal Grandfather   . Alcohol abuse Paternal Aunt   . Alcohol abuse Maternal  Uncle     Allergies  Allergen Reactions  . Morphine And Related Other (See Comments)    Slowed HR, lowered BP  . Morphine And Related Other (See Comments)    hypotension  . Nsaids Other (See Comments)    bleeding    Current Outpatient Prescriptions on File Prior to Visit  Medication Sig Dispense Refill  . clonazePAM (KLONOPIN) 0.5 MG tablet Take 1 tablet (0.5 mg total) by mouth at bedtime. 4 tablet 0  . folic acid (FOLVITE) 1 MG tablet Take 1 tablet (1 mg total) by mouth daily. For folate deficiency    . Multiple Vitamin (MULTIVITAMIN WITH MINERALS) TABS tablet Take 1 tablet by mouth daily. For nutritional supplement    . pantoprazole (PROTONIX) 40 MG tablet Take 1 tablet (40 mg total) by mouth daily. For reflux 30 tablet 6   No current facility-administered medications on file prior to visit.    BP 123/73 mmHg  Pulse 91  Temp(Src) 98.7 F (37.1 C) (Oral)  Ht 5\' 3"  (1.6 m)  Wt 169 lb (76.658 kg)  BMI 29.94 kg/m2  SpO2 96%  LMP 04/06/2015       Objective:   Physical Exam  General   Mental Status- Alert.  Orientation-Oriented x3. Build and Nutrition Well Nourished and Well Developed.  Skin General: Normal.  Color- Normal color. Moisture- Dry.Temperature warm. Lesions: No suspicious lesions  Head, Eyes, Ears, Nose, Thoat Ears-Normal. Auditory Canal-Bilateral-Normal. Tympanic Membrane- Bilateral-Normal. Eyes Fundi- Bilateral-Normal. Pupil- Bilateral- Direct reaction to light normal. Nose & Sinuses- Normal. Only boggy turbinates and some pnd. No sinus pressure. Nostril- Bilateral-Normal.  Neck Neck- No Bruits or Masses. Thyroid- Normal. No thyromegaly or nodules.  Breast No recent breast lumps reported since may studies. Pt defers exam today.  Chest and Lung Exam  Percussion: Quality and Intensity:-Percussion normal. Percussion of chest reveals- No Dullness. Palpation of the chest reveals- Non-tender. Auscultation: Breath sounds-Normal. Adventitious   Sounds:No adventitious   Vaginal External: Labia majora and minora normal/no lesions. Pelvic/Bimanual exam: Cervical OS not red or friable. No discharge. No cervical motion tenderness. No masses felt on palpation of adnexal regions. Cardiovascular Inspection: No Heaves. Auscultation: Heart Sounds- Normal sinus rhythm without murmur or gallop, S1 WNL and S2 WNL.  Abdomen Inspection:- Inspection Normal. Inspection of abdomen reveals- No Hernias. Palpation/Percussion: Palpation and Percussion of the abdomen reveal- Non Tender and No Palpable masses. Liver: Other Characteristics- No Hepatmegaly Spleen:Other Characteristics- No Splenomegaly. Auscultation: Auscultation of the abdomen reveals-Bowel sounds normal and No Abdominal bruits.    Neurologic Mental Status- Normal Cranial Nerves- Normal Bilaterally, Motor- Normal. Strength:5/5 normal muscle strength- All Muscles.  Gait- Normal. Meningeal Signs- None.  Musculoskeletal Global Assessment General- Joints show full range of motion without obvious deformity and Normal muscle mass. Strength 5/5 in upper and lower  extremities.  Lymphatic General lymphatics Description-No Generalized lymphadenopathy.         Assessment & Plan:

## 2015-05-08 NOTE — Progress Notes (Signed)
Pre visit review using our clinic review tool, if applicable. No additional management support is needed unless otherwise documented below in the visit note. 

## 2015-05-08 NOTE — Addendum Note (Signed)
Addended by: Bunnie Domino on: 05/08/2015 09:20 AM   Modules accepted: Orders

## 2015-05-08 NOTE — Assessment & Plan Note (Signed)
Cbc, cmp, tsh, lipid, ua, papsmear done today. Future order mammogram for November.  For general health please stop drinking any alcohohol at all.

## 2015-05-09 LAB — URINE CULTURE: Colony Count: >=100000

## 2015-05-10 ENCOUNTER — Telehealth: Payer: Self-pay | Admitting: Medical

## 2015-05-10 MED ORDER — CIPROFLOXACIN HCL 500 MG PO TABS: 500.0000 mg | ORAL_TABLET | Freq: Two times a day (BID) | ORAL | Status: DC

## 2015-05-10 NOTE — Telephone Encounter (Signed)
Uti. Call in rx.

## 2015-05-11 LAB — CYTOLOGY - PAP

## 2015-05-14 ENCOUNTER — Telehealth: Payer: Self-pay | Admitting: Gastroenterology

## 2015-05-14 ENCOUNTER — Other Ambulatory Visit: Payer: Self-pay

## 2015-05-14 NOTE — Telephone Encounter (Signed)
I have spoken with the patient and have her rescheduled to the next available endoscopy opening at [facility].

## 2015-06-02 ENCOUNTER — Ambulatory Visit (HOSPITAL_COMMUNITY)
Admission: RE | Admit: 2015-06-02 | Payer: PRIVATE HEALTH INSURANCE | Source: Ambulatory Visit | Admitting: Gastroenterology

## 2015-06-02 ENCOUNTER — Encounter (HOSPITAL_COMMUNITY): Admission: RE | Payer: Self-pay | Source: Ambulatory Visit

## 2015-06-02 SURGERY — ESOPHAGOGASTRODUODENOSCOPY (EGD) WITH PROPOFOL
Anesthesia: Monitor Anesthesia Care

## 2015-06-22 ENCOUNTER — Encounter (HOSPITAL_COMMUNITY): Payer: Self-pay | Admitting: Certified Registered Nurse Anesthetist

## 2015-06-22 NOTE — Progress Notes (Signed)
06-22-15 12 noon - spoke with "Dottie" of Dr. Kelby Fam office, unable to make contact with pt per phone- she would relay message to "Vaughan Basta and Shirlean Mylar".

## 2015-06-23 ENCOUNTER — Ambulatory Visit (HOSPITAL_COMMUNITY)
Admission: RE | Admit: 2015-06-23 | Payer: PRIVATE HEALTH INSURANCE | Source: Ambulatory Visit | Admitting: Gastroenterology

## 2015-06-23 ENCOUNTER — Encounter (HOSPITAL_COMMUNITY): Payer: Self-pay | Admitting: Anesthesiology

## 2015-06-23 ENCOUNTER — Encounter (HOSPITAL_COMMUNITY): Admission: RE | Payer: Self-pay | Source: Ambulatory Visit

## 2015-06-23 SURGERY — ESOPHAGOGASTRODUODENOSCOPY (EGD) WITH PROPOFOL
Anesthesia: Monitor Anesthesia Care

## 2015-06-23 MED ORDER — METOCLOPRAMIDE HCL 5 MG/ML IJ SOLN
INTRAMUSCULAR | Status: AC
  Filled 2015-06-23: qty 2

## 2015-06-23 MED ORDER — PROPOFOL 10 MG/ML IV BOLUS
INTRAVENOUS | Status: AC
  Filled 2015-06-23: qty 20

## 2015-06-23 MED ORDER — LIDOCAINE HCL 1 % IJ SOLN
INTRAMUSCULAR | Status: AC
  Filled 2015-06-23: qty 20

## 2015-06-23 MED ORDER — ONDANSETRON HCL 4 MG/2ML IJ SOLN
INTRAMUSCULAR | Status: AC
  Filled 2015-06-23: qty 2

## 2015-06-23 NOTE — Anesthesia Preprocedure Evaluation (Deleted)
Anesthesia Evaluation  Patient identified by MRN, date of birth, ID band Patient awake    Reviewed: Allergy & Precautions, NPO status , Patient's Chart, lab work & pertinent test results  Airway        Dental   Pulmonary pneumonia -, resolved, former smoker,          Cardiovascular negative cardio ROS  + Valvular Problems/Murmurs     Neuro/Psych PSYCHIATRIC DISORDERS Anxiety Depression negative neurological ROS     GI/Hepatic GERD-  Medicated,(+) Cirrhosis -  Esophageal Varices  substance abuse  alcohol use,   Endo/Other  negative endocrine ROS  Renal/GU negative Renal ROS  negative genitourinary   Musculoskeletal negative musculoskeletal ROS (+)   Abdominal   Peds negative pediatric ROS (+)  Hematology  (+) anemia ,   Anesthesia Other Findings   Reproductive/Obstetrics negative OB ROS                             Anesthesia Physical Anesthesia Plan Anesthesia Quick Evaluation

## 2015-07-21 ENCOUNTER — Encounter (HOSPITAL_COMMUNITY): Payer: Self-pay | Admitting: Psychology

## 2015-07-22 ENCOUNTER — Other Ambulatory Visit (HOSPITAL_COMMUNITY): Payer: PRIVATE HEALTH INSURANCE | Attending: Psychiatry

## 2015-07-23 ENCOUNTER — Telehealth (HOSPITAL_COMMUNITY): Payer: Self-pay | Admitting: Psychology

## 2015-07-23 ENCOUNTER — Other Ambulatory Visit (HOSPITAL_COMMUNITY): Payer: 59 | Attending: Psychiatry | Admitting: Licensed Clinical Social Worker

## 2015-07-23 DIAGNOSIS — F1020 Alcohol dependence, uncomplicated: Secondary | ICD-10-CM | POA: Diagnosis present
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
## 2015-07-24 ENCOUNTER — Other Ambulatory Visit (HOSPITAL_COMMUNITY): Payer: 59

## 2015-07-24 ENCOUNTER — Encounter (HOSPITAL_COMMUNITY): Payer: Self-pay | Admitting: Licensed Clinical Social Worker

## 2015-07-24 NOTE — Progress Notes (Signed)
    Daily Group Progress Note  Program: CD-IOP   Group Time: 1-2:30  Participation Level: Minimal  Behavioral Response: Appropriate  Type of Therapy: Process Group  Topic:  The first part of group was spent in process where group members shared about obstacles and challenges in early recovery. There was good disclosure and feedback among group members. Today is the beginning of a holiday weekend and group members shared their weekend plans of recovery. A new group member joined the program today. She was very tearful and reported she was feeling overwhelmed. She left the group shortly after beginning to share about herself. She eventually came back to the group but was quiet and tearful the rest of the group.   Group Time: 2:45-4  Participation Level: Minimal  Behavioral Response: Appropriate  Type of Therapy: Psycho-education Group  Topic: The second half of group focused on self-esteem. The last session which addressed core beliefs, today group members learned how to move forward from their negative core beliefs to working on their self-esteem. All group members shared their self-esteem was very low. Group members participated in a self-esteem guided meditation. Everyone said they enjoyed the meditation as well as the added positive affirmations they were able to repeat to themselves.      Summary: Today is the patient's first day in the program. Pt reported she went to a meeting this morning. She was very tearful and shared minimally. She left the group during process and said she was feeling overwhelmed. She eventually came back to group but was quiet and tearful. The pt was just completed 35 days of residential treatment and relapsed yesterday. Pt reported tearfully she has many roles: wife, mother, daughter but she really does not know who she is.  She did not participate in any of the intervention. Her sobriety date is today 9/1.       Family Program: Family present? No    Name of family member(s):   UDS collected: No Results:   AA/NA attended?: YesThursday  Sponsor?: No   MACKENZIE,LISBETH S, Licensed Cli

## 2015-07-28 ENCOUNTER — Encounter (HOSPITAL_COMMUNITY): Payer: Self-pay | Admitting: Emergency Medicine

## 2015-07-28 ENCOUNTER — Emergency Department (HOSPITAL_COMMUNITY): Payer: 59

## 2015-07-28 ENCOUNTER — Emergency Department (HOSPITAL_COMMUNITY)
Admission: EM | Admit: 2015-07-28 | Discharge: 2015-07-28 | Disposition: A | Discharge status: Home / Self Care | Payer: 59 | Attending: Emergency Medicine | Admitting: Emergency Medicine

## 2015-07-28 ENCOUNTER — Other Ambulatory Visit (HOSPITAL_COMMUNITY): Payer: 59

## 2015-07-28 DIAGNOSIS — Z3202 Encounter for pregnancy test, result negative: Secondary | ICD-10-CM | POA: Insufficient documentation

## 2015-07-28 DIAGNOSIS — F10929 Alcohol use, unspecified with intoxication, unspecified: Secondary | ICD-10-CM

## 2015-07-28 DIAGNOSIS — Z862 Personal history of diseases of the blood and blood-forming organs and certain disorders involving the immune mechanism: Secondary | ICD-10-CM | POA: Insufficient documentation

## 2015-07-28 DIAGNOSIS — Z79899 Other long term (current) drug therapy: Secondary | ICD-10-CM | POA: Insufficient documentation

## 2015-07-28 DIAGNOSIS — Z8742 Personal history of other diseases of the female genital tract: Secondary | ICD-10-CM | POA: Insufficient documentation

## 2015-07-28 DIAGNOSIS — F329 Major depressive disorder, single episode, unspecified: Secondary | ICD-10-CM | POA: Insufficient documentation

## 2015-07-28 DIAGNOSIS — F419 Anxiety disorder, unspecified: Secondary | ICD-10-CM | POA: Insufficient documentation

## 2015-07-28 DIAGNOSIS — Z7951 Long term (current) use of inhaled steroids: Secondary | ICD-10-CM | POA: Insufficient documentation

## 2015-07-28 DIAGNOSIS — Z8701 Personal history of pneumonia (recurrent): Secondary | ICD-10-CM | POA: Insufficient documentation

## 2015-07-28 DIAGNOSIS — F10129 Alcohol abuse with intoxication, unspecified: Secondary | ICD-10-CM | POA: Insufficient documentation

## 2015-07-28 DIAGNOSIS — K219 Gastro-esophageal reflux disease without esophagitis: Secondary | ICD-10-CM | POA: Insufficient documentation

## 2015-07-28 DIAGNOSIS — Z87891 Personal history of nicotine dependence: Secondary | ICD-10-CM | POA: Insufficient documentation

## 2015-07-28 DIAGNOSIS — R011 Cardiac murmur, unspecified: Secondary | ICD-10-CM | POA: Insufficient documentation

## 2015-07-28 DIAGNOSIS — I10 Essential (primary) hypertension: Secondary | ICD-10-CM | POA: Insufficient documentation

## 2015-07-28 LAB — RAPID URINE DRUG SCREEN, HOSP PERFORMED
AMPHETAMINES: NOT DETECTED
Barbiturates: NOT DETECTED
Benzodiazepines: NOT DETECTED
COCAINE: NOT DETECTED
Opiates: NOT DETECTED
TETRAHYDROCANNABINOL: NOT DETECTED

## 2015-07-28 LAB — COMPREHENSIVE METABOLIC PANEL
ALBUMIN: 3.6 g/dL (ref 3.5–5.0)
ALK PHOS: 132 U/L — AB (ref 38–126)
ALT: 42 U/L (ref 14–54)
AST: 95 U/L — AB (ref 15–41)
Anion gap: 6 (ref 5–15)
BILIRUBIN TOTAL: 1.7 mg/dL — AB (ref 0.3–1.2)
BUN: 5 mg/dL — AB (ref 6–20)
CALCIUM: 9.2 mg/dL (ref 8.9–10.3)
CO2: 28 mmol/L (ref 22–32)
Chloride: 111 mmol/L (ref 101–111)
Creatinine, Ser: 0.43 mg/dL — ABNORMAL LOW (ref 0.44–1.00)
GFR calc Af Amer: >60 mL/min (ref >60)
GFR calc non Af Amer: >60 mL/min (ref >60)
GLUCOSE: 97 mg/dL (ref 65–99)
Potassium: 3.5 mmol/L (ref 3.5–5.1)
SODIUM: 145 mmol/L (ref 135–145)
Total Protein: 8.2 g/dL — ABNORMAL HIGH (ref 6.5–8.1)

## 2015-07-28 LAB — CBC WITH DIFFERENTIAL/PLATELET
BASOS PCT: 0 % (ref 0–1)
Basophils Absolute: 0 10*3/uL (ref 0.0–0.1)
Eosinophils Absolute: 0 10*3/uL (ref 0.0–0.7)
Eosinophils Relative: 0 % (ref 0–5)
HEMATOCRIT: 37 % (ref 36.0–46.0)
Hemoglobin: 12.4 g/dL (ref 12.0–15.0)
LYMPHS PCT: 61 % — AB (ref 12–46)
Lymphs Abs: 3.1 10*3/uL (ref 0.7–4.0)
MCH: 30.5 pg (ref 26.0–34.0)
MCHC: 33.5 g/dL (ref 30.0–36.0)
MCV: 90.9 fL (ref 78.0–100.0)
MONOS PCT: 4 % (ref 3–12)
Monocytes Absolute: 0.2 10*3/uL (ref 0.1–1.0)
Neutro Abs: 1.8 10*3/uL (ref 1.7–7.7)
Neutrophils Relative %: 35 % — ABNORMAL LOW (ref 43–77)
PLATELETS: 71 10*3/uL — AB (ref 150–400)
RBC: 4.07 MIL/uL (ref 3.87–5.11)
RDW: 18.3 % — AB (ref 11.5–15.5)
WBC: 5.1 10*3/uL (ref 4.0–10.5)

## 2015-07-28 LAB — I-STAT CHEM 8, ED
CALCIUM ION: 1.14 mmol/L (ref 1.12–1.23)
CHLORIDE: 108 mmol/L (ref 101–111)
Creatinine, Ser: 0.9 mg/dL (ref 0.44–1.00)
Glucose, Bld: 95 mg/dL (ref 65–99)
HEMATOCRIT: 39 % (ref 36.0–46.0)
Hemoglobin: 13.3 g/dL (ref 12.0–15.0)
POTASSIUM: 3.6 mmol/L (ref 3.5–5.1)
SODIUM: 148 mmol/L — AB (ref 135–145)
TCO2: 23 mmol/L (ref 0–100)

## 2015-07-28 LAB — I-STAT CG4 LACTIC ACID, ED
Lactic Acid, Venous: 2.62 mmol/L (ref 0.5–2.0)
Lactic Acid, Venous: 2.46 mmol/L (ref 0.5–2.0)
Lactic Acid, Venous: 2.16 mmol/L (ref 0.5–2.0)

## 2015-07-28 LAB — AMMONIA: AMMONIA: 69 umol/L — AB (ref 9–35)

## 2015-07-28 LAB — URINALYSIS, ROUTINE W REFLEX MICROSCOPIC
BILIRUBIN URINE: NEGATIVE
Glucose, UA: NEGATIVE mg/dL
Hgb urine dipstick: NEGATIVE
Ketones, ur: NEGATIVE mg/dL
LEUKOCYTES UA: NEGATIVE
Nitrite: NEGATIVE
PH: 7.5 (ref 5.0–8.0)
Protein, ur: NEGATIVE mg/dL
Specific Gravity, Urine: 1.006 (ref 1.005–1.030)
UROBILINOGEN UA: 0.2 mg/dL (ref 0.0–1.0)

## 2015-07-28 LAB — ETHANOL: Alcohol, Ethyl (B): 321 mg/dL (ref <5)

## 2015-07-28 LAB — I-STAT BETA HCG BLOOD, ED (MC, WL, AP ONLY): I-stat hCG, quantitative: <5 m[IU]/mL (ref <5)

## 2015-07-28 LAB — CBG MONITORING, ED: Glucose-Capillary: 90 mg/dL (ref 65–99)

## 2015-07-28 MED ORDER — KETOROLAC TROMETHAMINE 60 MG/2ML IM SOLN
60.0000 mg | Freq: Once | INTRAMUSCULAR | Status: AC
  Administered 2015-07-28: 60 mg via INTRAMUSCULAR
  Filled 2015-07-28: qty 2

## 2015-07-28 MED ORDER — SODIUM CHLORIDE 0.9 % IV BOLUS (SEPSIS)
2000.0000 mL | Freq: Once | INTRAVENOUS | Status: AC
  Administered 2015-07-28: 2000 mL via INTRAVENOUS

## 2015-07-28 MED ORDER — VITAMIN B-1 100 MG PO TABS
100.0000 mg | ORAL_TABLET | Freq: Once | ORAL | Status: AC
  Administered 2015-07-28: 100 mg via ORAL
  Filled 2015-07-28: qty 1

## 2015-07-28 MED ORDER — IOHEXOL 300 MG/ML  SOLN
100.0000 mL | Freq: Once | INTRAMUSCULAR | Status: AC | PRN
  Administered 2015-07-28: 100 mL via INTRAVENOUS

## 2015-07-28 MED ORDER — SODIUM CHLORIDE 0.9 % IV SOLN
1000.0000 mL | Freq: Once | INTRAVENOUS | Status: AC
  Administered 2015-07-28: 1000 mL via INTRAVENOUS

## 2015-07-28 MED ORDER — IOHEXOL 300 MG/ML  SOLN
25.0000 mL | Freq: Once | INTRAMUSCULAR | Status: AC | PRN
  Administered 2015-07-28: 25 mL via ORAL

## 2015-07-28 NOTE — Discharge Instructions (Signed)
Alcohol Intoxication °Alcohol intoxication occurs when you drink enough alcohol that it affects your ability to function. It can be mild or very severe. Drinking a lot of alcohol in a short time is called binge drinking. This can be very harmful. Drinking alcohol can also be more dangerous if you are taking medicines or other drugs. Some of the effects caused by alcohol may include: °· Loss of coordination. °· Changes in mood and behavior. °· Unclear thinking. °· Trouble talking (slurred speech). °· Throwing up (vomiting). °· Confusion. °· Slowed breathing. °· Twitching and shaking (seizures). °· Loss of consciousness. °HOME CARE °· Do not drive after drinking alcohol. °· Drink enough water and fluids to keep your pee (urine) clear or pale yellow. Avoid caffeine. °· Only take medicine as told by your doctor. °GET HELP IF: °· You throw up (vomit) many times. °· You do not feel better after a few days. °· You frequently have alcohol intoxication. Your doctor can help decide if you should see a substance use treatment counselor. °GET HELP RIGHT AWAY IF: °· You become shaky when you stop drinking. °· You have twitching and shaking. °· You throw up blood. It may look bright red or like coffee grounds. °· You notice blood in your poop (bowel movements). °· You become lightheaded or pass out (faint). °MAKE SURE YOU:  °· Understand these instructions. °· Will watch your condition. °· Will get help right away if you are not doing well or get worse. °Document Released: 04/25/2008 Document Revised: 07/10/2013 Document Reviewed: 04/12/2013 °ExitCare® Patient Information ©2015 [employer]. This information is not intended to replace advice given to you by your health care provider. Make sure you discuss any questions you have with your health care provider. ° °

## 2015-07-28 NOTE — ED Notes (Signed)
Notified MD of pt's reported 8/10 pain.

## 2015-07-28 NOTE — ED Notes (Signed)
Per EMS-lower back pain and right into her hip-new, dull pain-no dysuria-no trauma

## 2015-07-28 NOTE — ED Notes (Signed)
Notified MD of pt's pain

## 2015-07-28 NOTE — ED Notes (Signed)
Bed: WA20 Expected date:  Expected time:  Means of arrival:  Comments: Triage 

## 2015-07-28 NOTE — ED Notes (Signed)
Per pt, states she is having problems urinating-patient is slow to repsond to questions-eyes ar red

## 2015-07-28 NOTE — ED Provider Notes (Signed)
CSN: [id number]     Arrival date & time 07/28/15  1225 History   First MD Initiated Contact with Patient 07/28/15 1534     Chief Complaint  Patient presents with  . Back Pain     (Consider location/radiation/quality/duration/timing/severity/associated sxs/prior Treatment) Patient is a 39 y.o. female presenting with back pain. The history is provided by the patient.  Back Pain Location:  Lumbar spine Quality:  Aching Radiates to:  Does not radiate Pain severity:  Moderate Pain is:  Same all the time Onset quality:  Gradual Duration:  2 days Timing:  Constant Progression:  Worsening Chronicity:  New Relieved by:  Nothing Worsened by:  Nothing tried Ineffective treatments:  None tried Associated symptoms: no chest pain, no dysuria, no fever and no headaches   Risk factors: lack of exercise    39 yo F the chief complaint of right sacroiliac pain. This been going on for couple days. Patient has tried patches without relief. Patient denies injury. Denies falls denies accidents. Patient denies fevers. Denies chills. Denies dysuria.  Past Medical History  Diagnosis Date  . Depression   . Pneumonia   . Cirrhosis   . Portal hypertension   . S/P alcohol detoxification     2-3 days at behavioral health previously  . Alcoholism   . Anxiety   . GERD (gastroesophageal reflux disease)   . 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 Menorrhagia   . Pancytopenia 01/15/2014  . UGI bleed 06/12/2014  . Blood transfusion without reported diagnosis   . Heart murmur     Patient states she may have   Past Surgical History  Procedure Laterality Date  . Cholecystectomy    . Esophagogastroduodenoscopy N/A 06/12/2014    Procedure: ESOPHAGOGASTRODUODENOSCOPY (EGD);  Surgeon: Gatha Mayer, MD;  Location: [facility];  Service: Endoscopy;  Laterality: N/A;  . Esophagogastroduodenoscopy (egd) with propofol N/A 07/29/2014    Procedure: ESOPHAGOGASTRODUODENOSCOPY (EGD) WITH PROPOFOL;   Surgeon: Inda Castle, MD;  Location: [facility];  Service: Endoscopy;  Laterality: N/A;   Family History  Problem Relation Age of Onset  . Colon polyps Mother   . Hypertension Mother   . Thyroid disease Mother   . Alcoholism Mother   . Alcoholism Father   . Alcohol abuse Maternal Grandfather   . Alcohol abuse Paternal Grandfather   . Alcohol abuse Paternal Aunt   . Alcohol abuse Maternal Uncle    Social History  Substance Use Topics  . Smoking status: Former Smoker -- 0.25 packs/day for .5 years    Types: Cigarettes  . Smokeless tobacco: Never Used     Comment: smokes 3 times a week  . Alcohol Use: 0.0 oz/week    0 Standard drinks or equivalent per week     Comment: Reports drinking 4 times a week. Usually drinks 2 bottle of wine when drinking.    OB History    Gravida Para Term Preterm AB TAB SAB Ectopic Multiple Living            2     Review of Systems  Constitutional: Negative for fever and chills.  HENT: Negative for congestion and rhinorrhea.   Eyes: Negative for redness and visual disturbance.  Respiratory: Negative for shortness of breath and wheezing.   Cardiovascular: Negative for chest pain and palpitations.  Gastrointestinal: Negative for nausea and vomiting.  Genitourinary: Negative for dysuria and urgency.  Musculoskeletal: Positive for myalgias and back pain. Negative for arthralgias.  Skin: Negative for pallor and wound.  Neurological: Negative for dizziness and headaches.      Allergies  Morphine and related; Morphine and related; and Nsaids  Home Medications   Prior to Admission medications   Medication Sig Start Date End Date Taking? Authorizing Provider  fluticasone (FLONASE) 50 MCG/ACT nasal spray Place 2 sprays into both nostrils daily. 05/08/15  Yes Edward Saguier, PA-C  Multiple Vitamin (MULTIVITAMIN WITH MINERALS) TABS tablet Take 1 tablet by mouth daily. For nutritional supplement 12/25/14  Yes Shuvon B Rankin, NP  Tetrahydrozoline HCl  (VISINE OP) Apply 1-2 drops to eye daily as needed (dry eyes.).   Yes Historical Provider, MD  ciprofloxacin (CIPRO) 500 MG tablet Take 1 tablet (500 mg total) by mouth 2 (two) times daily. Patient not taking: Reported on 06/05/2015 05/10/15   Mackie Pai, PA-C  clonazePAM (KLONOPIN) 0.5 MG tablet Take 1 tablet (0.5 mg total) by mouth at bedtime. Patient not taking: Reported on 05/13/2015 02/11/15   Mackie Pai, PA-C  folic acid (FOLVITE) 1 MG tablet Take 1 tablet (1 mg total) by mouth daily. For folate deficiency Patient not taking: Reported on 05/13/2015 12/25/14   Shuvon B Rankin, NP  pantoprazole (PROTONIX) 40 MG tablet Take 1 tablet (40 mg total) by mouth daily. For reflux Patient not taking: Reported on 05/13/2015 04/16/15   Inda Castle, MD   BP 108/64 mmHg  Pulse 62  Temp(Src) 98.6 F (37 C) (Oral)  Resp 18  SpO2 95%  LMP  Physical Exam  Constitutional: She is oriented to person, place, and time. She appears well-developed and well-nourished. No distress.  HENT:  Head: Normocephalic and atraumatic.  Eyes: EOM are normal. Pupils are equal, round, and reactive to light.  Neck: Normal range of motion. Neck supple.  Cardiovascular: Normal rate and regular rhythm.  Exam reveals no gallop and no friction rub.   No murmur heard. Pulmonary/Chest: Effort normal. She has no wheezes. She has no rales.  Abdominal: Soft. She exhibits no distension. There is no tenderness. There is no rebound.  Musculoskeletal: She exhibits tenderness (mild right SI joint). She exhibits no edema.  Neurological: She is alert and oriented to person, place, and time. She displays no Babinski's sign on the right side. She displays no Babinski's sign on the left side.  Reflex Scores:      Patellar reflexes are 2+ on the right side and 2+ on the left side.      Achilles reflexes are 2+ on the right side and 2+ on the left side. Noted clonus, able to ambulate without difficulty. Able to stand on her tiptoes.  Sensation intact in all nerve distributions distally. Pulses and motor intact. No noted weakness. Negative straight leg raise test bilaterally  Skin: Skin is warm and dry. She is not diaphoretic.  Psychiatric: She has a normal mood and affect. Her behavior is normal.    ED Course  Procedures (including critical care time) Labs Review Labs Reviewed  AMMONIA - Abnormal; Notable for the following:    Ammonia 69 (*)    All other components within normal limits  COMPREHENSIVE METABOLIC PANEL - Abnormal; Notable for the following:    BUN 5 (*)    Creatinine, Ser 0.43 (*)    Total Protein 8.2 (*)    AST 95 (*)    Alkaline Phosphatase 132 (*)    Total Bilirubin 1.7 (*)    All other components within normal limits  CBC WITH DIFFERENTIAL/PLATELET - Abnormal; Notable for the following:    RDW 18.3 (*)  Platelets 71 (*)    Neutrophils Relative % 35 (*)    Lymphocytes Relative 61 (*)    All other components within normal limits  ETHANOL - Abnormal; Notable for the following:    Alcohol, Ethyl (B) 321 (*)    All other components within normal limits  I-STAT CHEM 8, ED - Abnormal; Notable for the following:    Sodium 148 (*)    BUN <3 (*)    All other components within normal limits  I-STAT CG4 LACTIC ACID, ED - Abnormal; Notable for the following:    Lactic Acid, Venous 2.62 (*)    All other components within normal limits  I-STAT CG4 LACTIC ACID, ED - Abnormal; Notable for the following:    Lactic Acid, Venous 2.46 (*)    All other components within normal limits  I-STAT CG4 LACTIC ACID, ED - Abnormal; Notable for the following:    Lactic Acid, Venous 2.16 (*)    All other components within normal limits  URINE CULTURE  URINALYSIS, ROUTINE W REFLEX MICROSCOPIC (NOT AT [facility])  URINE RAPID DRUG SCREEN, HOSP PERFORMED  CBG MONITORING, ED  I-STAT BETA HCG BLOOD, ED ([facility], [facility], [facility] ONLY)  I-STAT CG4 LACTIC ACID, ED    Imaging Review Ct Head Wo Contrast  07/28/2015   CLINICAL DATA:  Back  pain, depression, anxiety  EXAM: CT HEAD WITHOUT CONTRAST  TECHNIQUE: Contiguous axial images were obtained from the base of the skull through the vertex without intravenous contrast.  COMPARISON:  07/18/2014  FINDINGS: Numerous intracranial calcified nodule stable. No hemorrhage or extra-axial fluid. No evidence of acute vascular territory infarct. No hydrocephalus. Stable atrophy. Calvarium intact. No significant sinus inflammatory change.  IMPRESSION: Numerous stable calcified dystrophic calcifications involving the brain as previously described. Possibility of neurocysticercosis not excluded.   Electronically Signed   By: Skipper Cliche M.D.   On: 07/28/2015 17:57   Ct Abdomen Pelvis W Contrast  07/28/2015   CLINICAL DATA:  Difficulty urinating and low back pain  EXAM: CT ABDOMEN AND PELVIS WITH CONTRAST  TECHNIQUE: Multidetector CT imaging of the abdomen and pelvis was performed using the standard protocol following bolus administration of intravenous contrast.  CONTRAST:  83mL OMNIPAQUE IOHEXOL 300 MG/ML SOLN, 151mL OMNIPAQUE IOHEXOL 300 MG/ML SOLN  COMPARISON:  02/16/2013  FINDINGS: Lung bases are free of acute infiltrate or sizable effusion.  The liver demonstrates mild nodularity similar to that seen on the prior exam. The multifocal small hypodense nodules are not well appreciated on this exam. There again noted changes of portal hypertension with recanalization of the paraumbilical vein and multiple abdominal wall varices. Multiple perisplenic varices are noted as well. Decompression into a large splenorenal shunt is noted. This is stable from the prior exam. The gallbladder has been surgically removed. Central biliary ductal dilatation is noted. The spleen, adrenal glands and pancreas are otherwise within normal limits.  The kidneys are well visualized bilaterally without renal calculi or urinary tract obstructive changes.  The appendix is within normal limits. The bladder is well distended. No pelvic  mass lesion is seen. No free pelvic fluid is noted. Mild cystic changes in the ovaries are noted bilaterally and stable from the prior exam. No acute bony abnormality is noted.  IMPRESSION: Changes consistent with portal hypertension with multiple portosystemic collaterals. Mild changes of hepatic cirrhosis are noted. The degree of hepatic enlargement has decreased slightly in the interval from the prior exam.  No new focal abnormality is seen.  No acute abnormality noted.  Electronically Signed   By: Inez Catalina M.D.   On: 07/28/2015 19:23   I have personally reviewed and evaluated these images and lab results as part of my medical decision-making.   EKG Interpretation None      MDM   Final diagnoses:  Alcohol intoxication, with unspecified complication    39 yo F with a chief complaint of right sacroiliac pain. Patient with no noted neuro deficits. No red flags. Patient however is somewhat withdrawn on exam. Has a history of significant alcohol use. Patient denies this. Able to walk without difficulty and so appears to be clinically sober. Shivering on exam as well. States that she is cold. Afebrile. No noted midline tenderness. Will treat with Toradol.  Approach via staff member told that patient was actually found wandering around the ICU was loss confused. Concern for altered mental status and was sent down to the emerge department. With this new history will obtain a laboratory evaluation CT of the head chest x-ray.   Alcohol level in the 300s. Patient observed in the ED for a few hours and given 3 L of fluid. Improvement of lactic acidosis. Patient clinically sober on reevaluation. CT of abdomen and pelvis was unremarkable for acute pathology. Ammonia was elevated at 69. With patient's improvement of mental status feel unlikely to be hepatic encephalopathy. Was likely secondary to alcohol intoxication.   I have discussed the diagnosis/risks/treatment options with the patient and believe  the pt to be eligible for discharge home to follow-up with PCP. We also discussed returning to the ED immediately if new or worsening sx occur. We discussed the sx which are most concerning (e.g., sudden worsening symptoms) that necessitate immediate return. Medications administered to the patient during their visit and any new prescriptions provided to the patient are listed below.  Medications given during this visit Medications  ketorolac (TORADOL) injection 60 mg (60 mg Intramuscular Given 07/28/15 1558)  0.9 %  sodium chloride infusion (0 mLs Intravenous Stopped 07/28/15 1830)  thiamine (VITAMIN B-1) tablet 100 mg (100 mg Oral Given 07/28/15 1915)  iohexol (OMNIPAQUE) 300 MG/ML solution 25 mL (25 mLs Oral Contrast Given 07/28/15 1740)  iohexol (OMNIPAQUE) 300 MG/ML solution 100 mL (100 mLs Intravenous Contrast Given 07/28/15 1859)  sodium chloride 0.9 % bolus 2,000 mL (0 mLs Intravenous Stopped 07/28/15 2258)    Discharge Medication List as of 07/28/2015 11:11 PM       The patient appears reasonably screen and/or stabilized for discharge and I doubt any other medical condition or other [facility] requiring further screening, evaluation, or treatment in the ED at this time prior to discharge.    Deno Etienne, DO 07/29/15 0004

## 2015-07-28 NOTE — ED Notes (Signed)
Critical  Lactic reported to RN and EDP

## 2015-07-28 NOTE — ED Notes (Signed)
Notified MD of pt's pain.

## 2015-07-28 NOTE — ED Notes (Signed)
LACTIC RESULTED, EDP FLYOD NOTIFIED.

## 2015-07-28 NOTE — ED Notes (Signed)
Tyrone Nine, DO made aware of patient CG4 Lactic result.

## 2015-07-29 ENCOUNTER — Other Ambulatory Visit (HOSPITAL_COMMUNITY): Payer: 59

## 2015-07-29 LAB — URINE CULTURE: Culture: NO GROWTH

## 2015-07-30 ENCOUNTER — Other Ambulatory Visit (HOSPITAL_COMMUNITY): Payer: 59

## 2015-07-31 ENCOUNTER — Other Ambulatory Visit (HOSPITAL_COMMUNITY): Payer: 59

## 2015-07-31 ENCOUNTER — Telehealth (HOSPITAL_COMMUNITY): Payer: Self-pay | Admitting: Psychology

## 2015-08-03 ENCOUNTER — Encounter (HOSPITAL_COMMUNITY): Payer: Self-pay | Admitting: Psychology

## 2015-08-03 ENCOUNTER — Other Ambulatory Visit (HOSPITAL_COMMUNITY): Payer: 59

## 2015-08-04 ENCOUNTER — Other Ambulatory Visit (HOSPITAL_COMMUNITY): Payer: 59

## 2015-08-05 ENCOUNTER — Other Ambulatory Visit (HOSPITAL_COMMUNITY): Payer: 59

## 2015-08-06 ENCOUNTER — Encounter (HOSPITAL_COMMUNITY): Payer: Self-pay | Admitting: *Deleted

## 2015-08-06 ENCOUNTER — Other Ambulatory Visit (HOSPITAL_COMMUNITY): Payer: 59

## 2015-08-06 ENCOUNTER — Emergency Department (HOSPITAL_COMMUNITY)
Admission: EM | Admit: 2015-08-06 | Discharge: 2015-08-06 | Disposition: A | Discharge status: Home / Self Care | Payer: 59 | Attending: Emergency Medicine | Admitting: Emergency Medicine

## 2015-08-06 DIAGNOSIS — S7012XA Contusion of left thigh, initial encounter: Secondary | ICD-10-CM | POA: Insufficient documentation

## 2015-08-06 DIAGNOSIS — F329 Major depressive disorder, single episode, unspecified: Secondary | ICD-10-CM | POA: Insufficient documentation

## 2015-08-06 DIAGNOSIS — Y998 Other external cause status: Secondary | ICD-10-CM | POA: Insufficient documentation

## 2015-08-06 DIAGNOSIS — K219 Gastro-esophageal reflux disease without esophagitis: Secondary | ICD-10-CM | POA: Insufficient documentation

## 2015-08-06 DIAGNOSIS — Z8742 Personal history of other diseases of the female genital tract: Secondary | ICD-10-CM | POA: Insufficient documentation

## 2015-08-06 DIAGNOSIS — F419 Anxiety disorder, unspecified: Secondary | ICD-10-CM | POA: Insufficient documentation

## 2015-08-06 DIAGNOSIS — IMO0002 Reserved for concepts with insufficient information to code with codable children: Secondary | ICD-10-CM

## 2015-08-06 DIAGNOSIS — F1310 Sedative, hypnotic or anxiolytic abuse, uncomplicated: Secondary | ICD-10-CM | POA: Insufficient documentation

## 2015-08-06 DIAGNOSIS — Z79899 Other long term (current) drug therapy: Secondary | ICD-10-CM | POA: Insufficient documentation

## 2015-08-06 DIAGNOSIS — Z862 Personal history of diseases of the blood and blood-forming organs and certain disorders involving the immune mechanism: Secondary | ICD-10-CM | POA: Insufficient documentation

## 2015-08-06 DIAGNOSIS — Z87891 Personal history of nicotine dependence: Secondary | ICD-10-CM | POA: Insufficient documentation

## 2015-08-06 DIAGNOSIS — S299XXA Unspecified injury of thorax, initial encounter: Secondary | ICD-10-CM | POA: Insufficient documentation

## 2015-08-06 DIAGNOSIS — Y9389 Activity, other specified: Secondary | ICD-10-CM | POA: Insufficient documentation

## 2015-08-06 DIAGNOSIS — S5012XA Contusion of left forearm, initial encounter: Secondary | ICD-10-CM | POA: Insufficient documentation

## 2015-08-06 DIAGNOSIS — I10 Essential (primary) hypertension: Secondary | ICD-10-CM | POA: Insufficient documentation

## 2015-08-06 DIAGNOSIS — Y9289 Other specified places as the place of occurrence of the external cause: Secondary | ICD-10-CM | POA: Insufficient documentation

## 2015-08-06 DIAGNOSIS — Z8701 Personal history of pneumonia (recurrent): Secondary | ICD-10-CM | POA: Insufficient documentation

## 2015-08-06 DIAGNOSIS — R011 Cardiac murmur, unspecified: Secondary | ICD-10-CM | POA: Insufficient documentation

## 2015-08-06 DIAGNOSIS — Z7951 Long term (current) use of inhaled steroids: Secondary | ICD-10-CM | POA: Insufficient documentation

## 2015-08-06 LAB — COMPREHENSIVE METABOLIC PANEL
ALBUMIN: 3.8 g/dL (ref 3.5–5.0)
ALT: 43 U/L (ref 14–54)
AST: 108 U/L — AB (ref 15–41)
Alkaline Phosphatase: 119 U/L (ref 38–126)
Anion gap: 14 (ref 5–15)
BUN: 10 mg/dL (ref 6–20)
CHLORIDE: 102 mmol/L (ref 101–111)
CO2: 22 mmol/L (ref 22–32)
Calcium: 8.9 mg/dL (ref 8.9–10.3)
Creatinine, Ser: 0.38 mg/dL — ABNORMAL LOW (ref 0.44–1.00)
GFR calc Af Amer: >60 mL/min (ref >60)
GFR calc non Af Amer: >60 mL/min (ref >60)
GLUCOSE: 148 mg/dL — AB (ref 65–99)
POTASSIUM: 3.7 mmol/L (ref 3.5–5.1)
SODIUM: 138 mmol/L (ref 135–145)
Total Bilirubin: 2.4 mg/dL — ABNORMAL HIGH (ref 0.3–1.2)
Total Protein: 8.5 g/dL — ABNORMAL HIGH (ref 6.5–8.1)

## 2015-08-06 LAB — CBC WITH DIFFERENTIAL/PLATELET
Basophils Absolute: 0 10*3/uL (ref 0.0–0.1)
Basophils Relative: 0 %
EOS PCT: 0 %
Eosinophils Absolute: 0 10*3/uL (ref 0.0–0.7)
HEMATOCRIT: 35.6 % — AB (ref 36.0–46.0)
HEMOGLOBIN: 11.7 g/dL — AB (ref 12.0–15.0)
LYMPHS ABS: 1.1 10*3/uL (ref 0.7–4.0)
LYMPHS PCT: 37 %
MCH: 30.7 pg (ref 26.0–34.0)
MCHC: 32.9 g/dL (ref 30.0–36.0)
MCV: 93.4 fL (ref 78.0–100.0)
MONO ABS: 0.2 10*3/uL (ref 0.1–1.0)
Monocytes Relative: 6 %
Neutro Abs: 1.7 10*3/uL (ref 1.7–7.7)
Neutrophils Relative %: 57 %
Platelets: 40 10*3/uL — ABNORMAL LOW (ref 150–400)
RBC: 3.81 MIL/uL — AB (ref 3.87–5.11)
RDW: 19.2 % — AB (ref 11.5–15.5)
WBC: 3 10*3/uL — AB (ref 4.0–10.5)

## 2015-08-06 LAB — ETHANOL: Alcohol, Ethyl (B): 268 mg/dL — ABNORMAL HIGH (ref <5)

## 2015-08-06 LAB — RAPID URINE DRUG SCREEN, HOSP PERFORMED
AMPHETAMINES: NOT DETECTED
BARBITURATES: NOT DETECTED
Benzodiazepines: POSITIVE — AB
COCAINE: NOT DETECTED
Opiates: NOT DETECTED
TETRAHYDROCANNABINOL: NOT DETECTED
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
## 2015-08-06 NOTE — ED Notes (Signed)
Pt out to desk requesting paperwork to be d/c, MD Jacubowitz made aware

## 2015-08-07 ENCOUNTER — Other Ambulatory Visit (HOSPITAL_COMMUNITY): Payer: 59

## 2015-08-10 ENCOUNTER — Other Ambulatory Visit (HOSPITAL_COMMUNITY): Payer: 59

## 2015-08-10 NOTE — Progress Notes (Signed)
Sara [name] is a 39 y.o. female patient. CD-IOP: Discharge, unsuccessful. Patient has not returned after first group session on September 1. Phone calls made by this counselor, inquiring about her well-being have not been received due to full voice mail.  She appears to lack motivation to do the work and make changes to support an abstinence-based lifestyle. The patient is discharged from the CD-IOP.         EVANS,ANN, LCAS

## 2015-08-11 ENCOUNTER — Other Ambulatory Visit (HOSPITAL_COMMUNITY): Payer: 59

## 2015-08-12 ENCOUNTER — Other Ambulatory Visit (HOSPITAL_COMMUNITY): Payer: 59

## 2015-08-13 ENCOUNTER — Other Ambulatory Visit (HOSPITAL_COMMUNITY): Payer: 59

## 2015-08-14 ENCOUNTER — Other Ambulatory Visit (HOSPITAL_COMMUNITY): Payer: 59

## 2015-08-17 ENCOUNTER — Other Ambulatory Visit (HOSPITAL_COMMUNITY): Payer: 59

## 2015-08-18 ENCOUNTER — Other Ambulatory Visit (HOSPITAL_COMMUNITY): Payer: 59

## 2015-08-19 ENCOUNTER — Other Ambulatory Visit (HOSPITAL_COMMUNITY): Payer: 59

## 2015-08-20 ENCOUNTER — Other Ambulatory Visit (HOSPITAL_COMMUNITY): Payer: 59

## 2015-08-21 ENCOUNTER — Other Ambulatory Visit (HOSPITAL_COMMUNITY): Payer: 59

## 2015-08-24 ENCOUNTER — Other Ambulatory Visit (HOSPITAL_COMMUNITY): Payer: PRIVATE HEALTH INSURANCE

## 2015-08-25 ENCOUNTER — Other Ambulatory Visit (HOSPITAL_COMMUNITY): Payer: PRIVATE HEALTH INSURANCE

## 2015-08-26 ENCOUNTER — Other Ambulatory Visit (HOSPITAL_COMMUNITY): Payer: PRIVATE HEALTH INSURANCE

## 2015-08-27 ENCOUNTER — Other Ambulatory Visit (HOSPITAL_COMMUNITY): Payer: PRIVATE HEALTH INSURANCE

## 2015-08-28 ENCOUNTER — Other Ambulatory Visit (HOSPITAL_COMMUNITY): Payer: PRIVATE HEALTH INSURANCE

## 2015-08-31 ENCOUNTER — Other Ambulatory Visit (HOSPITAL_COMMUNITY): Payer: PRIVATE HEALTH INSURANCE

## 2015-09-01 ENCOUNTER — Other Ambulatory Visit (HOSPITAL_COMMUNITY): Payer: PRIVATE HEALTH INSURANCE

## 2015-09-02 ENCOUNTER — Other Ambulatory Visit (HOSPITAL_COMMUNITY): Payer: PRIVATE HEALTH INSURANCE

## 2015-09-03 ENCOUNTER — Other Ambulatory Visit (HOSPITAL_COMMUNITY): Payer: PRIVATE HEALTH INSURANCE

## 2015-09-04 ENCOUNTER — Other Ambulatory Visit (HOSPITAL_COMMUNITY): Payer: PRIVATE HEALTH INSURANCE

## 2015-09-07 ENCOUNTER — Other Ambulatory Visit (HOSPITAL_COMMUNITY): Payer: PRIVATE HEALTH INSURANCE

## 2015-09-08 ENCOUNTER — Other Ambulatory Visit (HOSPITAL_COMMUNITY): Payer: PRIVATE HEALTH INSURANCE

## 2015-09-09 ENCOUNTER — Other Ambulatory Visit (HOSPITAL_COMMUNITY): Payer: PRIVATE HEALTH INSURANCE

## 2015-09-10 ENCOUNTER — Other Ambulatory Visit (HOSPITAL_COMMUNITY): Payer: PRIVATE HEALTH INSURANCE

## 2015-09-11 ENCOUNTER — Other Ambulatory Visit (HOSPITAL_COMMUNITY): Payer: PRIVATE HEALTH INSURANCE

## 2015-09-14 ENCOUNTER — Other Ambulatory Visit (HOSPITAL_COMMUNITY): Payer: PRIVATE HEALTH INSURANCE

## 2015-09-15 ENCOUNTER — Other Ambulatory Visit (HOSPITAL_COMMUNITY): Payer: PRIVATE HEALTH INSURANCE

## 2015-09-21 ENCOUNTER — Other Ambulatory Visit (HOSPITAL_COMMUNITY): Payer: PRIVATE HEALTH INSURANCE | Attending: Medical

## 2015-09-21 ENCOUNTER — Encounter (HOSPITAL_COMMUNITY): Payer: Self-pay | Admitting: Psychology

## 2015-09-21 ENCOUNTER — Other Ambulatory Visit (HOSPITAL_COMMUNITY): Payer: 59 | Attending: Psychiatry | Admitting: Psychology

## 2015-09-21 DIAGNOSIS — F1020 Alcohol dependence, uncomplicated: Secondary | ICD-10-CM | POA: Insufficient documentation

## 2015-09-21 DIAGNOSIS — F418 Other specified anxiety disorders: Secondary | ICD-10-CM | POA: Insufficient documentation

## 2015-09-21 DIAGNOSIS — F4312 Post-traumatic stress disorder, chronic: Secondary | ICD-10-CM | POA: Insufficient documentation

## 2015-09-21 DIAGNOSIS — K7030 Alcoholic cirrhosis of liver without ascites: Secondary | ICD-10-CM | POA: Insufficient documentation

## 2015-09-21 DIAGNOSIS — F1029 Alcohol dependence with unspecified alcohol-induced disorder: Secondary | ICD-10-CM

## 2015-09-21 DIAGNOSIS — I8500 Esophageal varices without bleeding: Secondary | ICD-10-CM

## 2015-09-22 ENCOUNTER — Other Ambulatory Visit (HOSPITAL_COMMUNITY): Payer: 59

## 2015-09-22 ENCOUNTER — Encounter (HOSPITAL_COMMUNITY): Payer: Self-pay | Admitting: Psychology

## 2015-09-22 DIAGNOSIS — K7030 Alcoholic cirrhosis of liver without ascites: Secondary | ICD-10-CM | POA: Insufficient documentation

## 2015-09-22 DIAGNOSIS — K7460 Unspecified cirrhosis of liver: Secondary | ICD-10-CM | POA: Insufficient documentation

## 2015-09-22 DIAGNOSIS — F4312 Post-traumatic stress disorder, chronic: Secondary | ICD-10-CM | POA: Insufficient documentation

## 2015-09-22 DIAGNOSIS — F419 Anxiety disorder, unspecified: Secondary | ICD-10-CM | POA: Insufficient documentation

## 2015-09-22 DIAGNOSIS — Z91410 Personal history of adult physical and sexual abuse: Secondary | ICD-10-CM | POA: Insufficient documentation

## 2015-09-22 DIAGNOSIS — F329 Major depressive disorder, single episode, unspecified: Secondary | ICD-10-CM | POA: Insufficient documentation

## 2015-09-22 DIAGNOSIS — F1020 Alcohol dependence, uncomplicated: Secondary | ICD-10-CM | POA: Insufficient documentation

## 2015-09-22 DIAGNOSIS — K219 Gastro-esophageal reflux disease without esophagitis: Secondary | ICD-10-CM | POA: Insufficient documentation

## 2015-09-23 ENCOUNTER — Encounter (HOSPITAL_COMMUNITY): Payer: Self-pay | Admitting: Medical

## 2015-09-23 ENCOUNTER — Encounter (HOSPITAL_COMMUNITY): Payer: Self-pay | Admitting: Psychology

## 2015-09-23 ENCOUNTER — Ambulatory Visit: Payer: Self-pay | Admitting: Medical

## 2015-09-23 ENCOUNTER — Other Ambulatory Visit (HOSPITAL_COMMUNITY): Payer: 59 | Attending: Medical | Admitting: Psychology

## 2015-09-23 ENCOUNTER — Other Ambulatory Visit (HOSPITAL_COMMUNITY): Payer: Self-pay | Admitting: Medical

## 2015-09-23 VITALS — BP 117/74 | HR 71 | Ht 63.5 in | Wt 157.8 lb

## 2015-09-23 DIAGNOSIS — I8500 Esophageal varices without bleeding: Secondary | ICD-10-CM

## 2015-09-23 DIAGNOSIS — K7030 Alcoholic cirrhosis of liver without ascites: Secondary | ICD-10-CM

## 2015-09-23 DIAGNOSIS — F10239 Alcohol dependence with withdrawal, unspecified: Secondary | ICD-10-CM

## 2015-09-23 DIAGNOSIS — IMO0002 Reserved for concepts with insufficient information to code with codable children: Secondary | ICD-10-CM

## 2015-09-23 DIAGNOSIS — F418 Other specified anxiety disorders: Secondary | ICD-10-CM

## 2015-09-23 DIAGNOSIS — F4312 Post-traumatic stress disorder, chronic: Secondary | ICD-10-CM
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
## 2015-09-23 NOTE — Progress Notes (Signed)
Sara [name] is a 39 y.o. female patient. CD-IOP: Treatment Planning Session. Counselor met with the patient this afternoon for her first individual session. She reported she had attended the 10 am AA meeting this morning. Counselor explained the importance of identifying goals for treatment. The patient agreed that staying sober was her primary goal of treatment. She also agreed that she cannot do this alone and understands that she must reach out to others and build a support network. When asked about what else she might want to work on while in this program, the patient reported she would like to become healthier and find a purpose or more intention in her daily life. When asked how she would go about achieving this goal, the patient identified staying sober, meeting other women in recovery, and improving her diet. The patient also identified getting enough sleep and going to the gym as additional ways to become healthier in her daily life. She is taking Seroquel at bedtime and has Vistaril as needed. On October 17th the patient received her first injection of Vivitrol and she returned from treatment in [place] with prescriptions for 1 year and was provided with directions to the medical center where she would be able to secure the injections. The patient admitted that she is not experiencing nearly the thoughts or preoccupation with alcohol as she has in the past. The patient displayed a degree of motivation and intention lacking in the times she has enrolled in this program in the past. Each time, she has dropped out after only a few sessions. The documentation was reviewed, signed and the treatment plan completed accordingly. This session proved effective in identifying goals and her degree of determination. We will continue to follow her closely in the days ahead. The patient's sobriety date remains 9/16.           Cadell Gabrielson, LCAS

## 2015-09-23 NOTE — Progress Notes (Signed)
Daily Group Progress Note  Program: CD-IOP   Group Time: 1-2:30 pm  Participation Level: Active  Behavioral Response: Appropriate and Sharing  Type of Therapy: Process Group  Topic: Process: The first half of group was spent in process. Prior to check-in, everyone present engaged in a 3 minute meditation. The meditation was led by a computer program called "calm.com". All of the members agreed that it was relaxing for them. During this half of group, group members shared about the past weekend and the various things they did to support and strengthen their recovery. Also present today was a new member. She introduced herself and shared about her struggles with addiction and recent return home from a residential treatment center in [place]. Random drug tests were collected from a few members.   Group Time: 2:45- 4pm  Participation Level: Active  Behavioral Response: Appropriate  Type of Therapy: Psycho-education Group  Topic: Walking Meditation/Psycho-ed; the second half of group started with a walking meditation outside in the [employer]. Members followed counselor around the garden, walking slowly in a line and not speaking to anyone. The group spent 10 minutes processing their experience and then returned to group room. A handout on Boredom was provided and members discussed some of the challenges in early recovery, including boredom. The handout included listing some activities that members were considering and these were discussed at length as they shared them with the group.    Summary: The patient was new to the program today, but shared openly and was engaged in the session. She agreed that the 3 minute meditation was relaxing. In process, she introduced herself and talked about her treatments for alcoholism. Most recently, the patient explained, she had returned on Saturday from [place] and 37 days in a residential treatment facility. Her family had welcomed her home and her  children and husband seemed pleased that she was back.  The patient reported there had been group homes at the facility and that one of her roommates had overdosed and died. It had really brought the dangers of addiction home to her and she admitted she hadn't really ever known anybody that had died, especially like that. The patient admitted that it was the first time she had ever flown sober and it seemed as if the flight was shorter than usual. When asked what she wanted to get from the program, the patient reported that she wants to be present for her family. Her father was recently diagnosed with ALS and her mother will need a lot of help in caring for him. The patient reported she also wanted to be there for her children and husband. She recognized she would have to regain his trust and that it would take some time. She is journaling, which she was taught in treatment, and making a gratitude list. In the second half of group, the patient identified hearing the birds sing and the wind in the trees as very clear to her. During the psycho-ed on Boredom, the patient identified the gym and getting back into photography as things she would like to do. She also admitted painting her office was something she had thought about. The patient was actively engaged in this first session and it is likely that having come directly from intensive residential treatment has taught her to share and disclose her feelings. She responded well to this intervention and her sobriety date is 9/16.  Family Program: Family present? No   Name of family member(s):   UDS  collected: No Results:   AA/NA attended?: No  Sponsor?: No   Marcus Schwandt, LCAS
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
## 2015-09-24 ENCOUNTER — Other Ambulatory Visit (HOSPITAL_COMMUNITY): Payer: 59 | Admitting: Licensed Clinical Social Worker

## 2015-09-24 DIAGNOSIS — F1020 Alcohol dependence, uncomplicated: Secondary | ICD-10-CM | POA: Diagnosis not present

## 2015-09-24 DIAGNOSIS — F10239 Alcohol dependence with withdrawal, unspecified: Secondary | ICD-10-CM

## 2015-09-24 DIAGNOSIS — F4312 Post-traumatic stress disorder, chronic: Secondary | ICD-10-CM | POA: Diagnosis not present

## 2015-09-24 DIAGNOSIS — F329 Major depressive disorder, single episode, unspecified: Secondary | ICD-10-CM | POA: Diagnosis not present

## 2015-09-24 DIAGNOSIS — K7460 Unspecified cirrhosis of liver: Secondary | ICD-10-CM | POA: Diagnosis not present

## 2015-09-24 DIAGNOSIS — K7030 Alcoholic cirrhosis of liver without ascites: Secondary | ICD-10-CM | POA: Diagnosis not present

## 2015-09-24 DIAGNOSIS — K219 Gastro-esophageal reflux disease without esophagitis: Secondary | ICD-10-CM | POA: Diagnosis not present

## 2015-09-24 DIAGNOSIS — Z91410 Personal history of adult physical and sexual abuse: Secondary | ICD-10-CM | POA: Diagnosis not present

## 2015-09-24 DIAGNOSIS — F419 Anxiety disorder, unspecified: Secondary | ICD-10-CM | POA: Diagnosis not present

## 2015-09-25 ENCOUNTER — Encounter (HOSPITAL_COMMUNITY): Payer: Self-pay | Admitting: Licensed Clinical Social Worker

## 2015-09-25 ENCOUNTER — Other Ambulatory Visit (HOSPITAL_COMMUNITY): Payer: 59

## 2015-09-25 NOTE — Progress Notes (Signed)
    Daily Group Progress Note  Program: CD-IOP   Group Time: 1-2:30  Participation Level: Active  Behavioral Response: Appropriate and Sharing  Type of Therapy: Process Group  Topic: The first part of group was spent in process where group members shared about obstacles and challenges since the last group. There was good disclosure and feedback among group members. Group members also shared any urges or cravings they had experienced. One patient had relapsed. The relapse was strategically discussed beginning with the trigger to the use. Patients also gave feedback and suggestions to the patient on alternatives to drinking. A passage was read by a group member on denial.   Group Time: 2:45-4  Participation Level: Active  Behavioral Response: Appropriate and Sharing  Type of Therapy: Psycho-education Group  Topic: The second part of group focused on "grieving the loss of addiction." Group members shared what they grieve in recovery. Patients also discussed the 5 stages of grief outlined by Anastasio Auerbach. Patents were actively engaged in the intervention.     Summary: Patient reported she did not go to a meeting last night but plans to go tonight. She went to [place] today and liked it. Patient talked a lot about her denial that she was an alcoholic. She has been in 5 treatment facilities in the last 2 years. She reports she is serious this time about her sobriety. She was actively engaged in the intervention today on grief and loss. She identified she is grieving the sense of calmness and relaxation from her use of alcohol. She now has to learn new techniques for centering herself through relaxation. Patient is new in the program. Her sobriety date is 9/16.    Family Program: Family present? No   Name of family member(s):   UDS collected: No Results:   AA/NA attended?: No  Sponsor?: No   Saraiya Kozma S, Licensed Cli

## 2015-09-28 ENCOUNTER — Encounter (HOSPITAL_COMMUNITY): Payer: Self-pay | Admitting: Psychology

## 2015-09-28 ENCOUNTER — Other Ambulatory Visit (HOSPITAL_COMMUNITY): Payer: Self-pay | Admitting: Medical

## 2015-09-28 ENCOUNTER — Other Ambulatory Visit (HOSPITAL_COMMUNITY): Payer: 59 | Admitting: Psychology

## 2015-09-28 DIAGNOSIS — F4312 Post-traumatic stress disorder, chronic: Secondary | ICD-10-CM

## 2015-09-28 DIAGNOSIS — F1020 Alcohol dependence, uncomplicated: Secondary | ICD-10-CM | POA: Diagnosis not present

## 2015-09-28 DIAGNOSIS — I8500 Esophageal varices without bleeding: Secondary | ICD-10-CM

## 2015-09-28 DIAGNOSIS — K7030 Alcoholic cirrhosis of liver without ascites: Secondary | ICD-10-CM

## 2015-09-28 DIAGNOSIS — F10239 Alcohol dependence with withdrawal, unspecified: Secondary | ICD-10-CM

## 2015-09-28 NOTE — Addendum Note (Signed)
Addended byAmalia Hailey, Ethelda Deangelo on: 09/28/2015 11:07 AM   Modules accepted: Medications

## 2015-09-28 NOTE — Progress Notes (Incomplete)
    Daily Group Progress Note  Program: CD-IOP   Group Time: 1-2:30 pm  Participation Level: {CHL AMB BH Group Participation:21022742}  Behavioral Response: {CHL AMB BH Group Behavior:21022743}  Type of Therapy: {CHL AMB BH Type of Therapy:21022741}  Topic: ***     Group Time: ***  Participation Level: {CHL AMB BH Group Participation:21022742}  Behavioral Response: {CHL AMB BH Group Behavior:21022743}  Type of Therapy: {CHL AMB BH Type of Therapy:21022741}  Topic: ***   Summary: ***   Family Program: Family present? {BHH YES OR NO:22294}   Name of family member(s): ***  UDS collected: {BHH YES OR NO:22294} Results: {Findings; urine drug screen:60936}  AA/NA attended?: {BHH YES OR NO:22294}{DAYS OF DXAJ:28786}  Sponsor?: {BHH YES OR NO:22294}   Jazleen Robeck, LCAS

## 2015-09-29 ENCOUNTER — Other Ambulatory Visit (HOSPITAL_COMMUNITY): Payer: 59

## 2015-09-29 ENCOUNTER — Encounter (HOSPITAL_COMMUNITY): Payer: Self-pay | Admitting: Psychology

## 2015-09-29 LAB — TOXASSURE SELECT,+ANTIDEPR,UR: PDF: 0

## 2015-09-30 ENCOUNTER — Other Ambulatory Visit (HOSPITAL_COMMUNITY): Payer: 59 | Admitting: Psychology

## 2015-09-30 ENCOUNTER — Encounter (HOSPITAL_COMMUNITY): Payer: Self-pay | Admitting: Psychology

## 2015-09-30 DIAGNOSIS — F10239 Alcohol dependence with withdrawal, unspecified: Secondary | ICD-10-CM

## 2015-09-30 DIAGNOSIS — F4312 Post-traumatic stress disorder, chronic: Secondary | ICD-10-CM

## 2015-09-30 DIAGNOSIS — F1020 Alcohol dependence, uncomplicated: Secondary | ICD-10-CM | POA: Diagnosis not present

## 2015-09-30 DIAGNOSIS — K7030 Alcoholic cirrhosis of liver without ascites: Secondary | ICD-10-CM

## 2015-09-30 NOTE — Progress Notes (Signed)
    Daily Group Progress Note  Program: CD-IOP   Group Time: 1-2:30 pm  Participation Level: Active  Behavioral Response: Appropriate and Sharing  Type of Therapy: Process Group  Topic: Counselor met with patient for group process session. Patients presented as active and engaged. Counselor linked patients' experiences to foster universality and a sense of hope. Patients shared about their journey in recovery and commitments to change behaviors. Counselor encouraged open sharing and dialogue. Drug tests were collected from each patient.   Group Time: 2:45- 4pm  Participation Level: Active  Behavioral Response: Sharing  Type of Therapy: Psycho-education Group  Topic: Counselor met with patient for group psycho-ed session. Counselor continued a discussion on "Grieving the Loss of Your Addiction" and asked patients to share about what they are giving up with sobriety. Counselor encouraged patients to explore their feelings of grief to help the transition to a sober lifestyle. Some patients met with the Program Director to discuss medication management.   Summary: Patient was open and sharing during group today. She reported that she is adjusting to being back in "normal" life after returning from in-patient treatment recently. She attended 4 AA meetings over the break and shows significant motivation to maintain her sobriety. Patient also reported that she secured an [place] sponsor and is in contact with her daily. Patient became tearful during session and reported that over the course of the weekend she did a lot of driving around town.  Her memories included thinking about all of the places that she had driven to while drinking. She felt emotional remembering that "drinking and driving felt like normal behavior". Counselor encouraged her for getting in touch with her grief and not minimizing her sadness. The patient was active and engaged in her recovery over the weekend and she responded well  to this intervention.  The patient's sobriety remains 9/16.   Family Program: Family present? No   Name of family member(s):   UDS collected: Yes Results: pending  AA/NA attended?: YesFriday, Saturday and Sunday  Sponsor?: Yes, she secured a sponsor over the weekend.    Joann Jorge, LCAS

## 2015-10-01 ENCOUNTER — Other Ambulatory Visit (HOSPITAL_COMMUNITY): Payer: 59 | Admitting: Licensed Clinical Social Worker

## 2015-10-01 ENCOUNTER — Encounter (HOSPITAL_COMMUNITY): Payer: Self-pay | Admitting: Psychology

## 2015-10-01 ENCOUNTER — Encounter (HOSPITAL_COMMUNITY): Payer: Self-pay | Admitting: Licensed Clinical Social Worker

## 2015-10-01 DIAGNOSIS — F1020 Alcohol dependence, uncomplicated: Secondary | ICD-10-CM | POA: Diagnosis not present

## 2015-10-01 DIAGNOSIS — F4312 Post-traumatic stress disorder, chronic: Secondary | ICD-10-CM

## 2015-10-01 DIAGNOSIS — F10239 Alcohol dependence with withdrawal, unspecified: Secondary | ICD-10-CM

## 2015-10-01 NOTE — Progress Notes (Signed)
    Daily Group Progress Note  Program: CD-IOP   Group Time: 1-2:30  Participation Level: Active  Behavioral Response: Appropriate and Sharing  Type of Therapy: Process Group  Topic: The first part of group was spent in process where group members shared about recovery-related activities since the last group. There was good disclosure and feedback among group members. Group members also shared any urges or cravings they had experienced.     Group Time: 2:45-4  Participation Level: Active  Behavioral Response: Appropriate and Sharing  Type of Therapy: Psycho-education Group  Topic: The second part of group focused on self-esteem and core beliefs. Patients participated in a "mask" activity where they wrote feelings they present on the front side of the mask and feelings they hide on the inside of the mask. They also participated in another activity where they taped index cards on their back and other group members wrote discretely how the patient presents in group. Also, a power point was presented on core beliefs and self esteem.     Summary: : Patient reported she did not go to a meeting last night. She did go to the gym and spoke to her sponsor. Patient was engaged in the intervention and participated in all activities. Patient reported her feelings on the outside of the mask: Confident, happy and quiet. On the inside of her mask she hides that she feels hopeless, inadequate and stupid. Patient reported her low self-esteem has negatively affected her life. Patient is opening up and becoming transparent in the group. Her sobriety date remains 9/16.   Family Program: Family present? No   Name of family member(s):   UDS collected: No Results:   AA/NA attended?: No  Sponsor?: No   Erin Obando S, Licensed Cli

## 2015-10-01 NOTE — Progress Notes (Signed)
    Daily Group Progress Note  Program: CD-IOP   Group Time: 1-2:30 pm  Participation Level: Active  Behavioral Response: Sharing  Type of Therapy: Psycho-education Group  Topic: Psycho-ed; the first part of group was spent in a psycho-ed with a visitor from the Chaplaincy program. Langley Adie arrived with the topic being "meditation". The group spent 10 minutes in a meditative practice, having been given a "mantra' to focus on while breathing diaphragmatically. Upon completion, members shared about their experiences during the meditation. The session proved very engaging with each member sharing openly about their experiences.   Group Time: 2:45-4 pm  Participation Level: Active  Behavioral Response: Appropriate  Type of Therapy: Process Group  Topic: Process: the second half of group was spent in process. Members completed the check-in and shared what they had done to support their recovery since the last session. All but one member had attended at least 1 AA meeting. The group shared about the struggles and challenges they were experiencing as well as the positive changes and behaviors they had embraced. As the session ended, members shared about their plans for the evening ahead.  Summary: The patient was attentive and engaged in group today. When asked to disclose her experience of the meditation, the patient reported she had found it relaxing. She admitted that at first, the ticking clock was disruptive to the process, but she just focused on the word give to her and she found it very helpful. When asked about acknowledging her progress in recovery, the patient admitted that she has a hard time feeling good about herself because she is afraid it may cause her to be complacent and overly confident. Her inability to applaud her accomplishments was discussed and other members agreed that this is also their struggle. In process, the patient reported she had attended 3 AA meetings and spoken  with her sponsor every day. She also went to the gym and took a sauna at the conclusion of her workout. The counselor praised her for these new and healthy behaviors she is engaging in and pointed out that her day is far different now than a few months back when she was drinking. The patient reported she feels much better and is sleeping well. She continues to practice new behaviors and is gradually extinquishing the old unhealthy behaviors. The patient responded well to this intervention and her sobriety date remains 9/16.    Family Program: Family present? No   Name of family member(s):   UDS collected: No Results:   AA/NA attended?: YesMonday, Tuesday and Wednesday  Sponsor?: No   Atheena Spano, LCAS

## 2015-10-02 ENCOUNTER — Other Ambulatory Visit (HOSPITAL_COMMUNITY): Payer: 59
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
## 2015-10-05 ENCOUNTER — Other Ambulatory Visit (HOSPITAL_COMMUNITY): Payer: 59 | Admitting: Psychology

## 2015-10-05 DIAGNOSIS — F10239 Alcohol dependence with withdrawal, unspecified: Secondary | ICD-10-CM

## 2015-10-05 DIAGNOSIS — F4312 Post-traumatic stress disorder, chronic: Secondary | ICD-10-CM

## 2015-10-05 DIAGNOSIS — K7030 Alcoholic cirrhosis of liver without ascites: Secondary | ICD-10-CM

## 2015-10-05 DIAGNOSIS — F1020 Alcohol dependence, uncomplicated: Secondary | ICD-10-CM | POA: Diagnosis not present

## 2015-10-06 ENCOUNTER — Telehealth: Payer: Self-pay | Admitting: Medical

## 2015-10-06 ENCOUNTER — Encounter (HOSPITAL_COMMUNITY): Payer: Self-pay | Admitting: Psychology

## 2015-10-06 ENCOUNTER — Ambulatory Visit: Payer: 59 | Admitting: Medical

## 2015-10-06 ENCOUNTER — Other Ambulatory Visit (HOSPITAL_COMMUNITY): Payer: 59

## 2015-10-06 LAB — TOXASSURE SELECT,+ANTIDEPR,UR: PDF: 0

## 2015-10-07 ENCOUNTER — Other Ambulatory Visit (HOSPITAL_COMMUNITY): Payer: Self-pay | Admitting: Medical

## 2015-10-07 ENCOUNTER — Other Ambulatory Visit (HOSPITAL_COMMUNITY): Payer: 59 | Admitting: Psychology

## 2015-10-07 DIAGNOSIS — F10239 Alcohol dependence with withdrawal, unspecified: Secondary | ICD-10-CM

## 2015-10-07 DIAGNOSIS — K7030 Alcoholic cirrhosis of liver without ascites: Secondary | ICD-10-CM

## 2015-10-07 DIAGNOSIS — F1020 Alcohol dependence, uncomplicated: Secondary | ICD-10-CM | POA: Diagnosis not present

## 2015-10-07 DIAGNOSIS — IMO0002 Reserved for concepts with insufficient information to code with codable children: Secondary | ICD-10-CM

## 2015-10-07 MED ORDER — QUETIAPINE FUMARATE ER 50 MG PO TB24: 50.0000 mg | ORAL_TABLET | Freq: Every day | ORAL | Status: DC

## 2015-10-07 NOTE — Telephone Encounter (Signed)
Disregard. Provider out of office.

## 2015-10-07 NOTE — Telephone Encounter (Signed)
Pt was no show 10/06/15 11:00am for follow up appt, pt has not rescheduled, charge or no charge?

## 2015-10-08 ENCOUNTER — Other Ambulatory Visit (HOSPITAL_COMMUNITY): Payer: 59 | Admitting: Licensed Clinical Social Worker

## 2015-10-08 DIAGNOSIS — F10239 Alcohol dependence with withdrawal, unspecified: Secondary | ICD-10-CM

## 2015-10-08 DIAGNOSIS — F1020 Alcohol dependence, uncomplicated: Secondary | ICD-10-CM | POA: Diagnosis not present

## 2015-10-08 DIAGNOSIS — F4312 Post-traumatic stress disorder, chronic: Secondary | ICD-10-CM

## 2015-10-08 NOTE — Telephone Encounter (Signed)
No charge. 

## 2015-10-09 ENCOUNTER — Encounter (HOSPITAL_COMMUNITY): Payer: Self-pay | Admitting: Licensed Clinical Social Worker

## 2015-10-09 ENCOUNTER — Encounter (HOSPITAL_COMMUNITY): Payer: Self-pay | Admitting: Psychology

## 2015-10-09 ENCOUNTER — Other Ambulatory Visit (HOSPITAL_COMMUNITY): Payer: 59

## 2015-10-09 NOTE — Progress Notes (Signed)
    Daily Group Progress Note  Program: CD-IOP   Group Time: 1-2:30  Participation Level: Active  Behavioral Response: Appropriate and Sharing  Type of Therapy: Process Group  Topic: The first part of group was spent in process where group members shared about recovery-related activities since the last group. There was good disclosure and feedback among group members. Group members also shared any thoughts or cravings they had experienced. A guided meditation was read to assist the patients in mindfulness. Patients were also given a calendar to identify 12 step meetings they will attend in the next week during the Thanksgiving holiday.    Group Time: 2:45-4  Participation Level: Active  Behavioral Response: Appropriate and Sharing  Type of Therapy: Process Group  Topic:The second part of group focused on pros and cons of addiction. Each patient participated in an activity "What do I enjoy/hate about my addiction." Patients identified specific examples of how their addiction affected them emotionally and physically. Patients were actively engaged in the intervention.       Summary: Patient reported she did not go to her normal meeting this morning. She slept in but is going to a 6:30 meeting tonight. Patient calls her sponsor daily. Patient was actively engaged in the intervention. She identified how alcohol helped her "escape from my life." She reports: "I was emotionally unavailable to my family and feelings." Patient is working hard on her recovery and making good progress. She reported no triggers or thoughts to use alcohol. Her sobriety date remains 9/16.   Family Program: Family present? No   Name of family member(s):   UDS collected: No Results:   AA/NA attended?: No  Sponsor?: Yes   MACKENZIE,LISBETH S, Licensed Cli
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
## 2015-10-12 ENCOUNTER — Telehealth (HOSPITAL_COMMUNITY): Payer: Self-pay | Admitting: Psychology

## 2015-10-12 ENCOUNTER — Encounter (HOSPITAL_COMMUNITY): Payer: Self-pay | Admitting: Psychology

## 2015-10-12 ENCOUNTER — Other Ambulatory Visit (HOSPITAL_COMMUNITY): Payer: 59

## 2015-10-12 NOTE — Progress Notes (Signed)
Sara [name] is a 39 y.o. female patient. CD-IOP: The patient did not appear for group today nor did she phone to explain her absence. A message  to her voice mail was left inquiring about her and asking her to contact me. Will wait to hear from the patient.        Rozena Fierro, LCAS

## 2015-10-13 ENCOUNTER — Other Ambulatory Visit (HOSPITAL_COMMUNITY): Payer: 59

## 2015-10-14 ENCOUNTER — Other Ambulatory Visit (HOSPITAL_COMMUNITY): Payer: 59 | Admitting: Medical

## 2015-10-14 ENCOUNTER — Encounter (HOSPITAL_COMMUNITY): Payer: Self-pay | Admitting: Medical

## 2015-10-14 DIAGNOSIS — Z62810 Personal history of physical and sexual abuse in childhood: Secondary | ICD-10-CM | POA: Insufficient documentation

## 2015-10-14 LAB — TOXASSURE SELECT,+ANTIDEPR,UR: PDF: 0
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
## 2015-10-16 ENCOUNTER — Other Ambulatory Visit (HOSPITAL_COMMUNITY): Payer: 59

## 2015-10-19 ENCOUNTER — Other Ambulatory Visit (HOSPITAL_COMMUNITY): Payer: 59

## 2015-10-19 ENCOUNTER — Telehealth (HOSPITAL_COMMUNITY): Payer: Self-pay | Admitting: Licensed Clinical Social Worker

## 2015-10-19 ENCOUNTER — Encounter (HOSPITAL_COMMUNITY): Payer: Self-pay | Admitting: Psychology

## 2015-10-19 NOTE — Telephone Encounter (Signed)
Called pt and pt's husband. N/A and vm full. Unable to leave a msg.

## 2015-10-20 ENCOUNTER — Other Ambulatory Visit (HOSPITAL_COMMUNITY): Payer: 59

## 2015-10-20 NOTE — Progress Notes (Signed)
Sara [name] is a 39 y.o. female patient. CD-IOP: Unsuccessful Discharge. The patient has not returned to group nor has she phoned to explalin her absence. She had made excellent progress in her recovery, including having picked up a 60-day chip, prior to relapsing on Sunday, November 20th. Despite her insistence that she would return to the program, the patient has not been seen again nor has her phone accepted any voice mail messages. Her husband's phone is the same. She is discharged from the program unsuccessful.         Mattia Liford, LCAS

## 2015-10-21 ENCOUNTER — Other Ambulatory Visit (HOSPITAL_COMMUNITY): Payer: 59

## 2015-10-22 ENCOUNTER — Other Ambulatory Visit (HOSPITAL_COMMUNITY): Payer: PRIVATE HEALTH INSURANCE

## 2015-10-23 ENCOUNTER — Other Ambulatory Visit (HOSPITAL_COMMUNITY): Payer: PRIVATE HEALTH INSURANCE

## 2015-10-24 ENCOUNTER — Emergency Department (HOSPITAL_COMMUNITY): Payer: No Typology Code available for payment source

## 2015-10-24 ENCOUNTER — Emergency Department (HOSPITAL_COMMUNITY)
Admission: EM | Admit: 2015-10-24 | Discharge: 2015-10-24 | Disposition: A | Discharge status: Home / Self Care | Payer: No Typology Code available for payment source | Attending: Emergency Medicine | Admitting: Emergency Medicine

## 2015-10-24 ENCOUNTER — Encounter (HOSPITAL_COMMUNITY): Payer: Self-pay | Admitting: Emergency Medicine

## 2015-10-24 DIAGNOSIS — R410 Disorientation, unspecified: Secondary | ICD-10-CM | POA: Insufficient documentation

## 2015-10-24 DIAGNOSIS — Z87891 Personal history of nicotine dependence: Secondary | ICD-10-CM | POA: Insufficient documentation

## 2015-10-24 DIAGNOSIS — R079 Chest pain, unspecified: Secondary | ICD-10-CM | POA: Diagnosis not present

## 2015-10-24 DIAGNOSIS — F419 Anxiety disorder, unspecified: Secondary | ICD-10-CM | POA: Insufficient documentation

## 2015-10-24 DIAGNOSIS — K219 Gastro-esophageal reflux disease without esophagitis: Secondary | ICD-10-CM | POA: Insufficient documentation

## 2015-10-24 DIAGNOSIS — Z8679 Personal history of other diseases of the circulatory system: Secondary | ICD-10-CM | POA: Insufficient documentation

## 2015-10-24 DIAGNOSIS — F329 Major depressive disorder, single episode, unspecified: Secondary | ICD-10-CM | POA: Diagnosis not present

## 2015-10-24 DIAGNOSIS — Z79899 Other long term (current) drug therapy: Secondary | ICD-10-CM | POA: Diagnosis not present

## 2015-10-24 DIAGNOSIS — R05 Cough: Secondary | ICD-10-CM | POA: Diagnosis not present

## 2015-10-24 DIAGNOSIS — Z8701 Personal history of pneumonia (recurrent): Secondary | ICD-10-CM | POA: Diagnosis not present

## 2015-10-24 DIAGNOSIS — F10120 Alcohol abuse with intoxication, uncomplicated: Secondary | ICD-10-CM | POA: Insufficient documentation

## 2015-10-24 DIAGNOSIS — Z3202 Encounter for pregnancy test, result negative: Secondary | ICD-10-CM | POA: Diagnosis not present

## 2015-10-24 DIAGNOSIS — Z862 Personal history of diseases of the blood and blood-forming organs and certain disorders involving the immune mechanism: Secondary | ICD-10-CM | POA: Diagnosis not present

## 2015-10-24 DIAGNOSIS — K921 Melena: Secondary | ICD-10-CM | POA: Insufficient documentation

## 2015-10-24 DIAGNOSIS — R4182 Altered mental status, unspecified: Secondary | ICD-10-CM | POA: Diagnosis present

## 2015-10-24 DIAGNOSIS — M549 Dorsalgia, unspecified: Secondary | ICD-10-CM | POA: Diagnosis not present

## 2015-10-24 DIAGNOSIS — F10920 Alcohol use, unspecified with intoxication, uncomplicated: Secondary | ICD-10-CM

## 2015-10-24 DIAGNOSIS — Z8742 Personal history of other diseases of the female genital tract: Secondary | ICD-10-CM | POA: Diagnosis not present

## 2015-10-24 DIAGNOSIS — R0602 Shortness of breath: Secondary | ICD-10-CM | POA: Insufficient documentation

## 2015-10-24 LAB — URINE MICROSCOPIC-ADD ON

## 2015-10-24 LAB — CBC
HCT: 37 % (ref 36.0–46.0)
Hemoglobin: 12.6 g/dL (ref 12.0–15.0)
MCH: 32.4 pg (ref 26.0–34.0)
MCHC: 34.1 g/dL (ref 30.0–36.0)
MCV: 95.1 fL (ref 78.0–100.0)
PLATELETS: 53 10*3/uL — AB (ref 150–400)
RBC: 3.89 MIL/uL (ref 3.87–5.11)
RDW: 16 % — AB (ref 11.5–15.5)
WBC: 3.8 10*3/uL — AB (ref 4.0–10.5)

## 2015-10-24 LAB — POC URINE PREG, ED: PREG TEST UR: NEGATIVE

## 2015-10-24 LAB — URINALYSIS, ROUTINE W REFLEX MICROSCOPIC
BILIRUBIN URINE: NEGATIVE
Glucose, UA: NEGATIVE mg/dL
HGB URINE DIPSTICK: NEGATIVE
KETONES UR: 15 mg/dL — AB
Nitrite: NEGATIVE
PH: 6.5 (ref 5.0–8.0)
PROTEIN: NEGATIVE mg/dL
Specific Gravity, Urine: 1.021 (ref 1.005–1.030)

## 2015-10-24 LAB — I-STAT TROPONIN, ED: TROPONIN I, POC: 0 ng/mL (ref 0.00–0.08)

## 2015-10-24 LAB — COMPREHENSIVE METABOLIC PANEL
ALBUMIN: 3.8 g/dL (ref 3.5–5.0)
ALT: 37 U/L (ref 14–54)
AST: 83 U/L — AB (ref 15–41)
Alkaline Phosphatase: 145 U/L — ABNORMAL HIGH (ref 38–126)
Anion gap: 11 (ref 5–15)
BILIRUBIN TOTAL: 2.3 mg/dL — AB (ref 0.3–1.2)
BUN: 9 mg/dL (ref 6–20)
CHLORIDE: 106 mmol/L (ref 101–111)
CO2: 23 mmol/L (ref 22–32)
CREATININE: 0.42 mg/dL — AB (ref 0.44–1.00)
Calcium: 8.5 mg/dL — ABNORMAL LOW (ref 8.9–10.3)
GFR calc Af Amer: >60 mL/min (ref >60)
GLUCOSE: 101 mg/dL — AB (ref 65–99)
Potassium: 3.7 mmol/L (ref 3.5–5.1)
Sodium: 140 mmol/L (ref 135–145)
Total Protein: 8.1 g/dL (ref 6.5–8.1)

## 2015-10-24 LAB — RAPID URINE DRUG SCREEN, HOSP PERFORMED
Amphetamines: NOT DETECTED
BARBITURATES: NOT DETECTED
Benzodiazepines: NOT DETECTED
Cocaine: NOT DETECTED
Opiates: NOT DETECTED
TETRAHYDROCANNABINOL: NOT DETECTED

## 2015-10-24 LAB — ACETAMINOPHEN LEVEL: Acetaminophen (Tylenol), Serum: <10 ug/mL — ABNORMAL LOW (ref 10–30)

## 2015-10-24 LAB — ETHANOL: Alcohol, Ethyl (B): 288 mg/dL — ABNORMAL HIGH (ref <5)

## 2015-10-24 LAB — SALICYLATE LEVEL

## 2015-10-24 LAB — CBG MONITORING, ED: GLUCOSE-CAPILLARY: 96 mg/dL (ref 65–99)

## 2015-10-24 LAB — LIPASE, BLOOD: LIPASE: 66 U/L — AB (ref 11–51)

## 2015-10-24 LAB — AMMONIA: Ammonia: 51 umol/L — ABNORMAL HIGH (ref 9–35)

## 2015-10-24 MED ORDER — ACETAMINOPHEN 500 MG PO TABS: 1000.0000 mg | ORAL_TABLET | Freq: Once | ORAL | Status: DC

## 2015-10-24 MED ORDER — SODIUM CHLORIDE 0.9 % IV BOLUS (SEPSIS)
1000.0000 mL | Freq: Once | INTRAVENOUS | Status: AC
  Administered 2015-10-24: 1000 mL via INTRAVENOUS

## 2015-10-24 MED ORDER — LORAZEPAM 1 MG PO TABS: 1.0000 mg | ORAL_TABLET | Freq: Four times a day (QID) | ORAL | Status: DC | PRN

## 2015-10-24 MED ORDER — THIAMINE HCL 100 MG/ML IJ SOLN
100.0000 mg | Freq: Once | INTRAMUSCULAR | Status: AC
  Administered 2015-10-24: 100 mg via INTRAVENOUS
  Filled 2015-10-24: qty 2

## 2015-10-24 MED ORDER — MULTI-VITAMIN/MINERALS PO TABS: 1.0000 | ORAL_TABLET | Freq: Every day | ORAL | Status: DC

## 2015-10-24 MED ORDER — LORAZEPAM 2 MG/ML IJ SOLN
1.0000 mg | Freq: Once | INTRAMUSCULAR | Status: AC
  Administered 2015-10-24: 1 mg via INTRAVENOUS
  Filled 2015-10-24: qty 1

## 2015-10-24 NOTE — ED Notes (Signed)
Per pt, states last drink was prior to coming to ED

## 2015-10-24 NOTE — ED Provider Notes (Signed)
CSN: [id number]     Arrival date & time 10/24/15  1544 History   First MD Initiated Contact with Patient 10/24/15 1713     Chief Complaint  Patient presents with  . Altered Mental Status     (Consider location/radiation/quality/duration/timing/severity/associated sxs/prior Treatment) HPI   Sara [name] is a 39 y.o. female with PMH significant for cirrhosis, portal HTN, alcoholism, anxiety, GERD, esophageal varices, pancytopenia who presents with gradual onset, moderate, constant altered mental status for the past couple of days.  She states she "feels like she's not able to think and that she has had periods of difficulty speaking like the words are mixed up", she also states "she feels shaky".  Denies fall or trauma.  Her last drink was this morning.  She reports she drinks 0.5 bottles of wine per day.    Past Medical History  Diagnosis Date  . Depression   . Pneumonia   . Cirrhosis ([place])   . Portal hypertension ([place])   . S/P alcohol detoxification     2-3 days at behavioral health previously  . Alcoholism ([place])   . Anxiety   . GERD (gastroesophageal reflux disease)   . 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 Menorrhagia   . Pancytopenia ([place]) 01/15/2014  . UGI bleed 06/12/2014  . Blood transfusion without reported diagnosis   . Heart murmur     Patient states she may have   Past Surgical History  Procedure Laterality Date  . Cholecystectomy    . Esophagogastroduodenoscopy N/A 06/12/2014    Procedure: ESOPHAGOGASTRODUODENOSCOPY (EGD);  Surgeon: Gatha Mayer, MD;  Location: [facility];  Service: Endoscopy;  Laterality: N/A;  . Esophagogastroduodenoscopy (egd) with propofol N/A 07/29/2014    Procedure: ESOPHAGOGASTRODUODENOSCOPY (EGD) WITH PROPOFOL;  Surgeon: Inda Castle, MD;  Location: [facility];  Service: Endoscopy;  Laterality: N/A;   Family History  Problem Relation Age of Onset  . Colon polyps Mother   . Hypertension Mother   . Thyroid disease  Mother   . Alcoholism Mother   . Alcoholism Father   . Alcohol abuse Maternal Grandfather   . Alcohol abuse Paternal Grandfather   . Alcohol abuse Paternal Aunt   . Alcohol abuse Maternal Uncle    Social History  Substance Use Topics  . Smoking status: Former Smoker -- 0.25 packs/day for .5 years    Types: Cigarettes  . Smokeless tobacco: Never Used     Comment: smokes 3 times a week  . Alcohol Use: 60.0 oz/week    0 Standard drinks or equivalent, 60 Glasses of wine, 40 Shots of liquor per week     Comment: Reports drinking 4 times a week. Usually drinks 2 bottle of wine when drinking.    OB History    Gravida Para Term Preterm AB TAB SAB Ectopic Multiple Living            2     Review of Systems  Constitutional: Negative for fever and chills.  Respiratory: Positive for cough and shortness of breath.   Cardiovascular: Positive for chest pain. Negative for palpitations.  Gastrointestinal: Positive for blood in stool. Negative for nausea, vomiting, abdominal pain, diarrhea and rectal pain.  Genitourinary: Negative for dysuria, frequency and hematuria.  Musculoskeletal: Positive for back pain.  Neurological: Negative for dizziness and headaches.  Psychiatric/Behavioral: Positive for confusion.  All other systems reviewed and are negative.  All other systems negative unless otherwise stated in HPI    Allergies  Morphine and related; Morphine and related;  and Nsaids  Home Medications   Prior to Admission medications   Medication Sig Start Date End Date Taking? Authorizing Provider  Naltrexone (VIVITROL) 380 MG SUSR Inject 380 mg into the muscle every 30 (thirty) days. 09/07/15  Yes Historical Provider, MD  ciprofloxacin (CIPRO) 500 MG tablet Take 1 tablet (500 mg total) by mouth 2 (two) times daily. Patient not taking: Reported on 06/05/2015 05/10/15   Mackie Pai, PA-C  clonazePAM (KLONOPIN) 0.5 MG tablet Take 1 tablet (0.5 mg total) by mouth at bedtime. Patient not  taking: Reported on 05/13/2015 02/11/15   Mackie Pai, PA-C  fluticasone [facility]) 50 MCG/ACT nasal spray Place 2 sprays into both nostrils daily. Patient not taking: Reported on 10/24/2015 05/08/15   Mackie Pai, PA-C  folic acid (FOLVITE) 1 MG tablet Take 1 tablet (1 mg total) by mouth daily. For folate deficiency Patient not taking: Reported on 05/13/2015 12/25/14   Shuvon B Rankin, NP  LORazepam (ATIVAN) 1 MG tablet Take 1 tablet (1 mg total) by mouth every 6 (six) hours as needed for anxiety (agitation and withdrawal symptoms). 10/24/15   Gloriann Loan, PA-C  Multiple Vitamin (MULTIVITAMIN WITH MINERALS) TABS tablet Take 1 tablet by mouth daily. For nutritional supplement Patient not taking: Reported on 10/24/2015 12/25/14   Shuvon B Rankin, NP  Multiple Vitamins-Minerals (MULTIVITAMIN WITH MINERALS) tablet Take 1 tablet by mouth daily. 10/24/15   Gloriann Loan, PA-C  pantoprazole (PROTONIX) 40 MG tablet Take 1 tablet (40 mg total) by mouth daily. For reflux Patient not taking: Reported on 05/13/2015 04/16/15   Inda Castle, MD  QUEtiapine (SEROQUEL XR) 50 MG TB24 24 hr tablet Take 1 tablet (50 mg total) by mouth daily. Patient not taking: Reported on 10/24/2015 10/07/15   Dara Hoyer, PA-C   BP 124/80 mmHg  Pulse 82  Temp(Src) 98.4 F (36.9 C) (Oral)  Resp 16  SpO2 98%  LMP 09/10/2015 Physical Exam  Constitutional: She is oriented to person, place, and time. She appears well-developed and well-nourished.  Disshelved appearing female  HENT:  Head: Normocephalic and atraumatic.  Mouth/Throat: Oropharynx is clear and moist.  Eyes: Conjunctivae are normal. Pupils are equal, round, and reactive to light. No scleral icterus.  Neck: Normal range of motion. Neck supple.  Cardiovascular: Normal rate, regular rhythm and normal heart sounds.   No murmur heard. Pulmonary/Chest: Effort normal and breath sounds normal. No accessory muscle usage or stridor. No respiratory distress. She has no wheezes.  She has no rhonchi. She has no rales.  Abdominal: Soft. Bowel sounds are normal. She exhibits no distension. There is no tenderness. There is no rebound and no guarding.  Musculoskeletal: Normal range of motion.  Lymphadenopathy:    She has no cervical adenopathy.  Neurological: She is alert and oriented to person, place, and time.  Speech clear without dysarthria.  Strength and sensation intact bilaterally throughout upper and lower extremities.  No asterixis.   Skin: Skin is warm and dry.  Psychiatric: She has a normal mood and affect. Her behavior is normal.    ED Course  Procedures (including critical care time) Labs Review Labs Reviewed  LIPASE, BLOOD - Abnormal; Notable for the following:    Lipase 66 (*)    All other components within normal limits  COMPREHENSIVE METABOLIC PANEL - Abnormal; Notable for the following:    Glucose, Bld 101 (*)    Creatinine, Ser 0.42 (*)    Calcium 8.5 (*)    AST 83 (*)    Alkaline  Phosphatase 145 (*)    Total Bilirubin 2.3 (*)    All other components within normal limits  CBC - Abnormal; Notable for the following:    WBC 3.8 (*)    RDW 16.0 (*)    Platelets 53 (*)    All other components within normal limits  URINALYSIS, ROUTINE W REFLEX MICROSCOPIC (NOT AT [facility]) - Abnormal; Notable for the following:    Color, Urine AMBER (*)    APPearance CLOUDY (*)    Ketones, ur 15 (*)    Leukocytes, UA SMALL (*)    All other components within normal limits  AMMONIA - Abnormal; Notable for the following:    Ammonia 51 (*)    All other components within normal limits  ACETAMINOPHEN LEVEL - Abnormal; Notable for the following:    Acetaminophen (Tylenol), Serum <10 (*)    All other components within normal limits  ETHANOL - Abnormal; Notable for the following:    Alcohol, Ethyl (B) 288 (*)    All other components within normal limits  URINE MICROSCOPIC-ADD ON - Abnormal; Notable for the following:    Squamous Epithelial / LPF 6-30 (*)     Bacteria, UA FEW (*)    All other components within normal limits  URINE RAPID DRUG SCREEN, HOSP PERFORMED  SALICYLATE LEVEL  POC URINE PREG, ED  CBG MONITORING, ED  Randolm Idol, ED    Imaging Review Dg Chest 2 View  10/24/2015  CLINICAL DATA:  Altered mental status EXAM: CHEST  2 VIEW COMPARISON:  10/2014 fifth chest radiograph. FINDINGS: Stable cardiomediastinal silhouette with normal heart size. No pneumothorax. No pleural effusion. Clear lungs, with no focal lung consolidation and no pulmonary edema. Right upper quadrant cholecystectomy clips. IMPRESSION: No active cardiopulmonary disease. Electronically Signed   By: Ilona Sorrel M.D.   On: 10/24/2015 18:45   I have personally reviewed and evaluated these images and lab results as part of my medical decision-making.   EKG Interpretation   Date/Time:  Saturday October 24 2015 18:19:27 EST Ventricular Rate:  73 PR Interval:  152 QRS Duration: 91 QT Interval:  462 QTC Calculation: 509 R Axis:   65 Text Interpretation:  Sinus rhythm Borderline prolonged QT interval No  significant change since last tracing Confirmed by NGUYEN, EMILY ([id number])  on 10/24/2015 6:24:42 PM      MDM   Final diagnoses:  Alcohol intoxication, uncomplicated ([place])  Confusion    Patient presents with confusion and "not being able to think" over the past couple of days.  Last ETOH intake today.  VSS, NAD.  On exam, heart RRR, lungs CTAB, abdomen soft and benign.  No neurological deficits.  No asterixis.  Will obtain labs.  EKG shows sinus rhythm, no acute changes.  Troponin 0.00. Lipase 66.  CMP shows ALP 145, AST 83, tbili 2.3 these appear to baseline.  Ammonia 51. CBC shows WBC 3.8 and PLT 53, which appear to be baseline Ethanol [id number], salicylate and acetaminophen levels unremarkable.  Suspect alcohol intoxication.  Doubt stroke.  Doubt trauma.  Doubt other drug intoxication.  Doubt infectious etiology.  Doubt cardiac etiology. CXR  unremarkable.  Will d/c home with ativan and multivitamin.  Patient has strong support group at home with her husband and will follow up with PCP on Monday.  Evaluation does not show pathology requring ongoing emergent intervention or admission. Pt is hemodynamically stable and mentating appropriately. Discussed findings/results and plan with patient/guardian, who agrees with plan. All questions answered. Return precautions discussed and  outpatient follow up given.   Case has been discussed with and seen by Dr. Alfonse Spruce who agrees with the above plan for discharge.     Gloriann Loan, PA-C 10/24/15 2257  Harvel Quale, MD 10/25/15 [phone]

## 2015-10-24 NOTE — Discharge Instructions (Signed)
Alcohol Withdrawal  Alcohol withdrawal is a group of symptoms that can develop when a person who drinks heavily and regularly stops drinking or drinks less.  CAUSES  Heavy and regular drinking can cause chemicals that send signals from the brain to the body (neurotransmitters) to deactivate. Alcohol withdrawal develops when deactivated neurotransmitters reactivate because a person stops drinking or drinks less.  RISK FACTORS  The more a person drinks and the longer he or she drinks, the greater the risk of alcohol withdrawal. Severe withdrawal is more likely to develop in someone who:  Had severe alcohol withdrawal in the past.  Had a seizure during a previous episode of alcohol withdrawal.  Is elderly.  Is pregnant.  Has been abusing drugs.  Has other medical problems, including:  Infection.  Heart, lung, or liver disease.  Seizures.  Mental health problems. SYMPTOMS  Symptoms of this condition can be mild to moderate, or they can be severe.  Mild to moderate symptoms may include:  Fatigue.  Nightmares.  Trouble sleeping.  Depression.  Anxiety.  Inability to think clearly.  Mood swings.  Irritability.  Loss of appetite.  Nausea or vomiting.  Clammy skin.  Extreme sweating.  Rapid heartbeat.  Shakiness.  Uncontrollable shaking (tremor). Severe symptoms may include:  Fever.  Seizures.  Severe confusion.  Feeling or seeing things that are not there (hallucinations). Symptoms usually begin within eight hours after a person stops drinking or drinks less. They can last for weeks.  DIAGNOSIS  Alcohol withdrawal is diagnosed with a medical history and physical exam. Sometimes, urine and blood tests are also done.  TREATMENT  Treatment may involve:  Monitoring blood pressure, pulse, and breathing.  Getting fluids through an IV tube.  Medicine to reduce anxiety.  Medicine to prevent or control seizures.  Multivitamins and B vitamins.  Having a health care provider check on you  daily. If symptoms are moderate to severe or if there is a risk of severe withdrawal, treatment may be done at a hospital or treatment center.  HOME CARE INSTRUCTIONS  Take medicines and vitamin supplements only as directed by your health care provider.  Do not drink alcohol.  Have someone stay with you or be available if you need help.  Drink enough fluid to keep your urine clear or pale yellow.  Consider joining a 12-step program or another alcohol support group. SEEK MEDICAL CARE IF:  Your symptoms get worse or do not go away.  You cannot keep food or water in your stomach.  You are struggling with not drinking alcohol.  You cannot stop drinking alcohol. SEEK IMMEDIATE MEDICAL CARE IF:  You have an irregular heartbeat.  You have chest pain.  You have trouble breathing.  You have symptoms of severe withdrawal, such as:  A fever.  Seizures.  Severe confusion.  Hallucinations. This information is not intended to replace advice given to you by your health care provider. Make sure you discuss any questions you have with your health care provider.  Document Released: 08/17/2005 Document Revised: 11/28/2014 Document Reviewed: 08/26/2014  Elsevier Interactive Patient Education 2016 [employer].    Emergency Department Resource Guide 1) Find a Doctor and Pay Out of Pocket Although you won't have to find out who is covered by your insurance plan, it is a good idea to ask around and get recommendations. You will then need to call the office and see if the doctor you have chosen will accept you as a new patient and what types of  options they offer for patients who are self-pay. Some doctors offer discounts or will set up payment plans for their patients who do not have insurance, but you will need to ask so you aren't surprised when you get to your appointment.  2) Contact Your Local Health Department Not all health departments have doctors that can see patients for sick visits, but many do,  so it is worth a call to see if yours does. If you don't know where your local health department is, you can check in your phone book. The CDC also has a tool to help you locate your state's health department, and many state websites also have listings of all of their local health departments.  3) Find a [facility] If your illness is not likely to be very severe or complicated, you may want to try a walk in clinic. These are popping up all over the country in pharmacies, drugstores, and shopping centers. They're usually staffed by nurse practitioners or physician assistants that have been trained to treat common illnesses and complaints. They're usually fairly quick and inexpensive. However, if you have serious medical issues or chronic medical problems, these are probably not your best option.  No Primary Care Doctor: - Call Health Connect at  [phone] - they can help you locate a primary care doctor that  accepts your insurance, provides certain services, etc. - Physician Referral Service- [phone]  Chronic Pain Problems: Organization         Address  Phone   Notes  [facility]  [phone] Patients need to be referred by their primary care doctor.   Medication Assistance: Organization         Address  Phone   Notes  [facility] [address] [place], [place] [address] [phone] --Must be a resident of [facility] -- Must have NO insurance coverage whatsoever (no Medicaid/ Medicare, etc.) -- The pt. MUST have a primary care doctor that directs their care regularly and follows them in the community   [facility]  [phone]   [employer]  [phone]    Agencies that provide inexpensive medical care: Organization         Address  Phone   Notes  [place]  [phone]   [facility]    [phone]   [facility] [place], [place] [address] [phone]   [place] [address] [place] [phone]   Planned Parenthood    [phone]   [facility]    [phone]   [address] [address], [place] Phone:  [phone], Fax:  [phone] Hours of Operation:  9 am - 6 pm, M-F.  Also accepts Medicaid/Medicare and self-pay.  [facility] [place], [address], [place] Phone: [phone], Fax: [phone]. Hours of Operation:  8:30 am - 5:30 pm, M-F.  Also accepts Medicaid and self-pay.  [facility] [place] [address], [place] Phone: [phone]   [place], [place], [place] [phone], Ext. [phone] Mondays & Thursdays: 7-9 AM.  First 15 patients are seen on a first come, first serve basis.    [place] Providers:  Organization         Address  Phone   Notes  [facility] [address], [place] [phone] Also accepts self-pay patients.  [facility] [id number] [place], [place]  [phone]   [place], [address], [place] [phone]   [facility] [address] [place] [phone]   Lucianne Lei [address], [place]   [phone] Only accepts [place] Access [place] patients after they have their name applied to their card.   Self-Pay (no insurance) in [facility]:  Organization         Address  Phone   Notes  Sickle Cell Patients, [facility] [place] [phone]   [facility] [place] [phone]   [facility] [place]  [place], [place], [place] [phone]   [facility]/Dr. Osei-Bonsu  [address], [place] or  [address], [place] [phone] Phone number for both [place] and [place] locations is the same.  Urgent Medical and [facility] [address] [place] [phone]   [facility] [address], [place] or [address] [phone]   [facility] [address] [place] [phone], phone; [phone], fax Sees patients 1st and 3rd Saturday of every month.  Must not qualify for public or private insurance (i.e. Medicaid, Medicare, [place] Health Choice, Veterans' Benefits)  Household income should be no more than 200% of the poverty level The clinic cannot treat you if you are pregnant or think you are pregnant  Sexually transmitted diseases are not treated at the clinic.    Dental Care: Organization         Address  Phone  Notes  [facility] Department of [facility] [place] [phone] Accepts children up to age 46 who are enrolled in [place] or [place]; pregnant women with a Medicaid card; and children who have applied for Medicaid or [place] Health Choice, but were declined, whose parents can pay a reduced fee at time of service.  [name] Department of [facility]  [address], [place] [phone] Accepts children up to age 8 who are enrolled in [place] or [place]; pregnant women with a Medicaid card; and children who have applied for Medicaid or [place] Health Choice, but were declined, whose parents can pay a reduced fee at time of service.  [place] Adult Dental Access PROGRAM  [employer] [phone] Patients are seen by appointment only. Walk-ins are not accepted. [place] will see patients 27 years of age and older. Monday - Tuesday (8am-5pm) Most Wednesdays (8:30-5pm) $30 per visit, cash only  [facility] Adult Dental Access PROGRAM  [address], [facility] [phone] Patients are seen by appointment only. Walk-ins are not accepted. [employer] will see patients 50 years of age and older. One Wednesday Evening (Monthly: Volunteer Based).  $30 per visit, cash only  [place]  [phone] for adults; Children under age 54, call Graduate Pediatric Dentistry at [phone]. Children aged 65-14, please call [phone] to request a pediatric application.  Dental services are provided in all areas of dental care including fillings, crowns and bridges, complete and partial  dentures, implants, gum treatment, root canals, and extractions. Preventive care is also provided. Treatment is provided to both adults and children. Patients are selected via a lottery and there is often a waiting list.   [facility] [address], [place]  [phone] [email or web address]   [facility] [address] [place], [place] [phone], Ext. [phone] Second and Fourth Thursday of each month, opens at 6:30 AM; Clinic ends at 9 AM.  Patients are seen on a first-come first-served basis, and a limited number are seen during each clinic.   [facility]  [address] Hillard Danker [place], [place] [phone]   Eligibility Requirements You must have lived in [place], [place], or [place] counties for at least the last three months.   You cannot be eligible for state or federal sponsored [employer], including [employer], [place], or [employer].   You generally cannot be eligible for healthcare insurance through your employer.    How to apply: Eligibility screenings are held every Tuesday and Wednesday afternoon from 1:00 pm until 4:00 pm. You do not need an appointment for the interview!  [facility] [address], [place], [place]   [place]  [place] Department  [place]  [phone]    Behavioral Health Resources in the Community: Intensive Outpatient Programs Organization         Address  Phone  Notes  [place]. [address] [place], [place] [phone]   [facility] [address] [place], [place]   [facility]: [facility] [address], [place], [place]   [place] [address]  [place], [place] or [phone]   Substance Abuse Resources Organization         Address  Phone  Notes  [facility] Drug [facility]  [phone]   [place]  [phone]   [facility] [place]   Chinita Pester  [phone]   Residential & Outpatient Substance Abuse Program  [phone]   Psychological Services Organization         Address  Phone  Notes  [facility] [place]  [place]  [phone]   [place] [address] [place] or [phone]    Mobile Crisis Teams Organization         Address  Phone  Notes  [facility], Mobile Crisis Care Unit  [phone]   [facility]  [address] [place], [place]   [place] [address] [place] [phone]    Self-Help/Support Groups Organization         Address  Phone             Notes  [place]. of [place] - variety of support groups  [place] Call for more information  Narcotics Anonymous (NA), [facility] [address], [employer] [place]  2 meetings at this location   Special educational needs teacher         Address  Phone  Notes  [facility] [place],    [place]  [place]  [address] [place] [id number], [place], [place]   [place], [place] [phone]  Admissions: 8am-3pm M-F  [facility] [address]    °[place], [place] [phone]   °[facility] [address], [place], [place] [phone]   °[facility] [address]  °[place], [place] [phone]   °[facility] - Intensive Outpatient [address], [place], [place] [phone]   °[facility] ([facility] [address]  °[place], [employer] [employer] [phone] or [phone]   °[facility] ([facility]) [address] [place], [place] [phone] Accepts Medicaid  °[facility] [address]  °[place] 1-800-659-3381 Substance Abuse/Addiction Treatment  ° °[place] Behavioral Health Resources °Organization         Address  Phone  Notes  °[facility]  ([phone]   °Julie Brannon, PhD [address] [place], [place]   ([phone]) [phone] or (336) [phone]   °[facility]   [address] °[place], [place] ([phone]   °[facility] [address], [place], [place] [phone]) [phone] Insurance/Medicaid/sponsorship through [facility]  °[facility] [address]                                    [place], [place] [phone] Therapy/tele-psych/case  °[facility] [address]  ° , [place] ([phone]) [phone]    °Dr. Arfeen  ([phone]) [phone]   °[facility] [place]  [employer] [place] [facility] 1) [address],  °2) [address], [place] °3)  [address], [place] ([phone]) [phone] °(336) [phone] ° °(336) [phone]   °[place] Child Abuse Hotline (336) 342-1394 or (336) [phone] (After Hours)    ° ° ° ° °

## 2015-10-24 NOTE — ED Notes (Signed)
Per husband, states disoriented and confused for a couple of days

## 2015-10-24 NOTE — ED Notes (Signed)
Pt made aware of needed urine specimen  

## 2015-10-26 ENCOUNTER — Other Ambulatory Visit (HOSPITAL_COMMUNITY): Payer: PRIVATE HEALTH INSURANCE

## 2015-10-27 ENCOUNTER — Other Ambulatory Visit (HOSPITAL_COMMUNITY): Payer: PRIVATE HEALTH INSURANCE

## 2015-10-28 ENCOUNTER — Other Ambulatory Visit (HOSPITAL_COMMUNITY): Payer: PRIVATE HEALTH INSURANCE

## 2015-10-29 ENCOUNTER — Other Ambulatory Visit (HOSPITAL_COMMUNITY): Payer: PRIVATE HEALTH INSURANCE

## 2015-10-30 ENCOUNTER — Other Ambulatory Visit (HOSPITAL_COMMUNITY): Payer: PRIVATE HEALTH INSURANCE

## 2015-11-02 ENCOUNTER — Other Ambulatory Visit (HOSPITAL_COMMUNITY): Payer: PRIVATE HEALTH INSURANCE

## 2015-11-02 NOTE — Telephone Encounter (Signed)
Only opened up to send note to MA. Pt has been seen various times by the ED but has never followed up here afterwards. Wanted to see how she is. Since not seen by me in 6 months clarify that our office is her pcp. If we are still pcp offer appointment to follow up on most recent ED visit.Give her 30 minute slot.

## 2015-11-02 NOTE — Telephone Encounter (Signed)
Spoke with patient and she has an appointment scheduled for 11/04/15.

## 2015-11-03 ENCOUNTER — Other Ambulatory Visit (HOSPITAL_COMMUNITY): Payer: PRIVATE HEALTH INSURANCE

## 2015-11-04 ENCOUNTER — Other Ambulatory Visit (HOSPITAL_COMMUNITY): Payer: PRIVATE HEALTH INSURANCE

## 2015-11-04 ENCOUNTER — Encounter: Payer: PRIVATE HEALTH INSURANCE | Admitting: Medical

## 2015-11-04 NOTE — Progress Notes (Signed)
This encounter was created in error - please disregard.

## 2015-11-05 ENCOUNTER — Other Ambulatory Visit (HOSPITAL_COMMUNITY): Payer: PRIVATE HEALTH INSURANCE

## 2015-11-05 ENCOUNTER — Encounter (HOSPITAL_COMMUNITY): Payer: Self-pay | Admitting: Emergency Medicine

## 2015-11-05 ENCOUNTER — Emergency Department (HOSPITAL_COMMUNITY)
Admission: EM | Admit: 2015-11-05 | Discharge: 2015-11-05 | Disposition: A | Discharge status: Home / Self Care | Payer: No Typology Code available for payment source | Attending: Emergency Medicine | Admitting: Emergency Medicine

## 2015-11-05 DIAGNOSIS — F419 Anxiety disorder, unspecified: Secondary | ICD-10-CM | POA: Diagnosis not present

## 2015-11-05 DIAGNOSIS — F1010 Alcohol abuse, uncomplicated: Secondary | ICD-10-CM | POA: Diagnosis present

## 2015-11-05 DIAGNOSIS — Z8679 Personal history of other diseases of the circulatory system: Secondary | ICD-10-CM | POA: Diagnosis not present

## 2015-11-05 DIAGNOSIS — Z8701 Personal history of pneumonia (recurrent): Secondary | ICD-10-CM | POA: Diagnosis not present

## 2015-11-05 DIAGNOSIS — Z87891 Personal history of nicotine dependence: Secondary | ICD-10-CM | POA: Diagnosis not present

## 2015-11-05 DIAGNOSIS — R160 Hepatomegaly, not elsewhere classified: Secondary | ICD-10-CM | POA: Diagnosis not present

## 2015-11-05 DIAGNOSIS — Z8719 Personal history of other diseases of the digestive system: Secondary | ICD-10-CM | POA: Diagnosis not present

## 2015-11-05 DIAGNOSIS — Z862 Personal history of diseases of the blood and blood-forming organs and certain disorders involving the immune mechanism: Secondary | ICD-10-CM | POA: Insufficient documentation

## 2015-11-05 DIAGNOSIS — Z8742 Personal history of other diseases of the female genital tract: Secondary | ICD-10-CM | POA: Diagnosis not present

## 2015-11-05 DIAGNOSIS — Z79899 Other long term (current) drug therapy: Secondary | ICD-10-CM | POA: Insufficient documentation

## 2015-11-05 HISTORY — DX: Anemia, unspecified: D64.9

## 2015-11-05 LAB — CBC WITH DIFFERENTIAL/PLATELET
Basophils Absolute: 0 10*3/uL (ref 0.0–0.1)
Basophils Relative: 0 %
Eosinophils Absolute: 0 10*3/uL (ref 0.0–0.7)
Eosinophils Relative: 0 %
HCT: 36.1 % (ref 36.0–46.0)
Hemoglobin: 12.1 g/dL (ref 12.0–15.0)
Lymphocytes Relative: 61 %
Lymphs Abs: 2.8 10*3/uL (ref 0.7–4.0)
MCH: 32.7 pg (ref 26.0–34.0)
MCHC: 33.5 g/dL (ref 30.0–36.0)
MCV: 97.6 fL (ref 78.0–100.0)
Monocytes Absolute: 0.3 10*3/uL (ref 0.1–1.0)
Monocytes Relative: 7 %
Neutro Abs: 1.5 10*3/uL — ABNORMAL LOW (ref 1.7–7.7)
Neutrophils Relative %: 32 %
Platelets: 39 10*3/uL — ABNORMAL LOW (ref 150–400)
RBC: 3.7 MIL/uL — ABNORMAL LOW (ref 3.87–5.11)
RDW: 16.3 % — ABNORMAL HIGH (ref 11.5–15.5)
WBC: 4.6 10*3/uL (ref 4.0–10.5)

## 2015-11-05 LAB — COMPREHENSIVE METABOLIC PANEL
ALT: 58 U/L — ABNORMAL HIGH (ref 14–54)
AST: 164 U/L — ABNORMAL HIGH (ref 15–41)
Albumin: 4.1 g/dL (ref 3.5–5.0)
Alkaline Phosphatase: 162 U/L — ABNORMAL HIGH (ref 38–126)
Anion gap: 11 (ref 5–15)
BUN: 10 mg/dL (ref 6–20)
CO2: 24 mmol/L (ref 22–32)
Calcium: 8.6 mg/dL — ABNORMAL LOW (ref 8.9–10.3)
Chloride: 108 mmol/L (ref 101–111)
Creatinine, Ser: 0.41 mg/dL — ABNORMAL LOW (ref 0.44–1.00)
GFR calc Af Amer: >60 mL/min (ref >60)
GFR calc non Af Amer: >60 mL/min (ref >60)
Glucose, Bld: 103 mg/dL — ABNORMAL HIGH (ref 65–99)
Potassium: 4 mmol/L (ref 3.5–5.1)
Sodium: 143 mmol/L (ref 135–145)
Total Bilirubin: 2.2 mg/dL — ABNORMAL HIGH (ref 0.3–1.2)
Total Protein: 8.6 g/dL — ABNORMAL HIGH (ref 6.5–8.1)

## 2015-11-05 LAB — ETHANOL: Alcohol, Ethyl (B): 483 mg/dL (ref <5)

## 2015-11-05 MED ORDER — CHLORDIAZEPOXIDE HCL 10 MG PO CAPS: 10.0000 mg | ORAL_CAPSULE | Freq: Three times a day (TID) | ORAL | Status: DC | PRN

## 2015-11-05 MED ORDER — BACLOFEN 10 MG PO TABS: 10.0000 mg | ORAL_TABLET | Freq: Three times a day (TID) | ORAL | Status: DC

## 2015-11-05 NOTE — Discharge Instructions (Signed)
Return here as needed.  Follow-up with an outpatient facility as needed

## 2015-11-05 NOTE — ED Notes (Signed)
Pt c/o rt sided chest pain; pt did not advise this earlier; pt states that she fell down the stairs and has had discomfort; pt c/o pressure to chest at present; pt with no obvious injury or deformity

## 2015-11-05 NOTE — ED Provider Notes (Signed)
CSN: [id number]     Arrival date & time 11/05/15  1911 History   First MD Initiated Contact with Patient 11/05/15 1934     Chief Complaint  Patient presents with  . detox    HPI   Patient is a 39 y/o F here because she states her "doctor sent her here for detox".  Her DOC is alcohol.  Her last detox was in October in [place].  She drinks 3-4 bottles of wine a day. States she hasn't had anything to drink since she had a bottle of wine yesterday.  She also shares she has been coughing up bright red blood x3-4 days. When asked to quantify she says "a lot".  Endorses confusion, jitteriness, and "hearing weird sounds".  Denies hearing voices, fever, headaches, chest pain, shortness of breath, weakness, dizziness, headache, blurred vision, back pain, neck pain, fever, dysuria, incontinence, near syncope or syncope.    Past Medical History  Diagnosis Date  . Depression   . Pneumonia   . Cirrhosis ([place])   . Portal hypertension ([place])   . S/P alcohol detoxification     2-3 days at behavioral health previously  . Alcoholism ([place])   . Anxiety   . GERD (gastroesophageal reflux disease)   . 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 Menorrhagia   . Pancytopenia ([place]) 01/15/2014  . UGI bleed 06/12/2014  . Blood transfusion without reported diagnosis   . Heart murmur     Patient states she may have  . Anemia    Past Surgical History  Procedure Laterality Date  . Cholecystectomy    . Esophagogastroduodenoscopy N/A 06/12/2014    Procedure: ESOPHAGOGASTRODUODENOSCOPY (EGD);  Surgeon: Gatha Mayer, MD;  [facility];  Service: Endoscopy;  Laterality: N/A;  . Esophagogastroduodenoscopy (egd) with propofol N/A 07/29/2014    Procedure: ESOPHAGOGASTRODUODENOSCOPY (EGD) WITH PROPOFOL;  Surgeon: Inda Castle, MD;  Location: [facility];  Service: Endoscopy;  Laterality: N/A;   Family History  Problem Relation Age of Onset  . Colon polyps Mother   . Hypertension Mother   .  Thyroid disease Mother   . Alcoholism Mother   . Alcoholism Father   . Alcohol abuse Maternal Grandfather   . Alcohol abuse Paternal Grandfather   . Alcohol abuse Paternal Aunt   . Alcohol abuse Maternal Uncle    Social History  Substance Use Topics  . Smoking status: Former Smoker -- 0.25 packs/day for .5 years    Types: Cigarettes  . Smokeless tobacco: Never Used     Comment: smokes 3 times a week  . Alcohol Use: 0.0 oz/week    0 Standard drinks or equivalent per week     Comment: . Usually drinks 2 bottle of wine when drinking.    OB History    Gravida Para Term Preterm AB TAB SAB Ectopic Multiple Living            2     Review of Systems All other systems negative except as documented in the HPI. All pertinent positives and negatives as reviewed in the HPI.    Allergies  Morphine and related; Morphine and related; and Nsaids  Home Medications   Prior to Admission medications   Medication Sig Start Date End Date Taking? Authorizing Provider  Multiple Vitamins-Minerals (MULTIVITAMIN WITH MINERALS) tablet Take 1 tablet by mouth daily. 10/24/15  Yes Gloriann Loan, PA-C  ciprofloxacin (CIPRO) 500 MG tablet Take 1 tablet (500 mg total) by mouth 2 (two) times daily. Patient not taking:  Reported on 06/05/2015 05/10/15   Mackie Pai, PA-C  clonazePAM (KLONOPIN) 0.5 MG tablet Take 1 tablet (0.5 mg total) by mouth at bedtime. Patient not taking: Reported on 05/13/2015 02/11/15   Mackie Pai, PA-C  fluticasone [facility]) 50 MCG/ACT nasal spray Place 2 sprays into both nostrils daily. Patient not taking: Reported on 10/24/2015 05/08/15   Mackie Pai, PA-C  folic acid (FOLVITE) 1 MG tablet Take 1 tablet (1 mg total) by mouth daily. For folate deficiency Patient not taking: Reported on 05/13/2015 12/25/14   Shuvon B Rankin, NP  LORazepam (ATIVAN) 1 MG tablet Take 1 tablet (1 mg total) by mouth every 6 (six) hours as needed for anxiety (agitation and withdrawal symptoms). 10/24/15   Gloriann Loan, PA-C  Multiple Vitamin (MULTIVITAMIN WITH MINERALS) TABS tablet Take 1 tablet by mouth daily. For nutritional supplement Patient not taking: Reported on 10/24/2015 12/25/14   Shuvon B Rankin, NP  Naltrexone (VIVITROL) 380 MG SUSR Inject 380 mg into the muscle every 30 (thirty) days. 09/07/15   Historical Provider, MD  pantoprazole (PROTONIX) 40 MG tablet Take 1 tablet (40 mg total) by mouth daily. For reflux Patient not taking: Reported on 05/13/2015 04/16/15   Inda Castle, MD  QUEtiapine (SEROQUEL XR) 50 MG TB24 24 hr tablet Take 1 tablet (50 mg total) by mouth daily. Patient not taking: Reported on 10/24/2015 10/07/15   Dara Hoyer, PA-C   BP 115/71 mmHg  Pulse 80  Temp(Src) 98.2 F (36.8 C) (Oral)  Resp 18  SpO2 95%  LMP 09/10/2015 Physical Exam  Constitutional: She appears well-developed and well-nourished. No distress.  HENT:  Head: Normocephalic and atraumatic.  Mouth/Throat: Oropharynx is clear and moist.  Eyes: Conjunctivae are normal. Pupils are equal, round, and reactive to light. Right eye exhibits no nystagmus. Left eye exhibits no nystagmus.  Uncooperative to EOMs   Pupils with delayed constriction to light bilaterally   Neck: Normal range of motion. Neck supple.  Cardiovascular: Normal rate, regular rhythm and normal heart sounds.   Pulmonary/Chest: Effort normal and breath sounds normal.  Abdominal: Soft. Normal appearance and bowel sounds are normal. She exhibits no distension and no ascites. There is hepatomegaly. There is no tenderness.  Musculoskeletal: She exhibits no edema.  Neurological: She is alert. She exhibits normal muscle tone. Coordination normal.  Skin: Skin is warm and dry. No rash noted. No erythema.  Psychiatric: She has a normal mood and affect. Her behavior is normal. Thought content normal.  Nursing note and vitals reviewed.   ED Course  Procedures (including critical care time) Labs Review Labs Reviewed  ETHANOL  COMPREHENSIVE  METABOLIC PANEL  CBC WITH DIFFERENTIAL/PLATELET  URINALYSIS, ROUTINE W REFLEX MICROSCOPIC (NOT AT [facility])  URINE RAPID DRUG SCREEN, HOSP PERFORMED    Imaging Review No results found. I have personally reviewed and evaluated these images and lab results as part of my medical decision-making.   EKG Interpretation None     Patient states she does not want detox she would like to go home and detox.  The patient is advised return here as needed.  Patient is showing no signs of DTs at this time.  She is walking about the room, talking on her cell phone     Dalia Heading, PA-C 11/05/15 2102  Leo Grosser, MD 11/06/15 1356

## 2015-11-05 NOTE — ED Notes (Signed)
Pt states she his here for alcohol detox  Pt states she saw her dr earlier today and was referred here for alcohol detox  Pt states she just started drinking about a week ago and drinks approximately 2 bottles of wine per day   Last time she drank was yesterday

## 2015-11-05 NOTE — ED Notes (Signed)
ETOH - [phone]  Notified primary nurse

## 2015-11-05 NOTE — ED Notes (Signed)
EKG normal per Irena Cords, PA; OK to discharge home

## 2015-11-06 ENCOUNTER — Other Ambulatory Visit (HOSPITAL_COMMUNITY): Payer: PRIVATE HEALTH INSURANCE

## 2015-11-09 ENCOUNTER — Telehealth: Payer: Self-pay | Admitting: Medical

## 2015-11-09 NOTE — Telephone Encounter (Signed)
Pt was no show 11/04/15 10:15am, er follow up appt, called pt and lm to reschedule, pt was in ER 11/05/15, 2nd no show, charge or no charge?

## 2015-11-11 NOTE — Telephone Encounter (Signed)
Charge or no charge? °

## 2015-11-11 NOTE — Telephone Encounter (Signed)
Charge. 

## 2015-11-12 ENCOUNTER — Telehealth: Payer: Self-pay | Admitting: Gastroenterology

## 2015-11-12 NOTE — Telephone Encounter (Signed)
She needs an appointment. She needs to establish with a new doctor.

## 2015-11-12 NOTE — Telephone Encounter (Signed)
OK to schedule follow up with either Dr. Havery Moros or Dr. Silverio Decamp to discuss EGD. Last ov with Dr. Deatra Ina was 03/2015. I spoke to patient and she will callback around the first of January to schedule an appointment for March.

## 2015-11-13 NOTE — Telephone Encounter (Signed)
charge 

## 2015-12-03 ENCOUNTER — Emergency Department (HOSPITAL_COMMUNITY)
Admission: EM | Admit: 2015-12-03 | Discharge: 2015-12-03 | Disposition: A | Discharge status: Home / Self Care | Payer: No Typology Code available for payment source | Attending: Emergency Medicine | Admitting: Emergency Medicine

## 2015-12-03 ENCOUNTER — Encounter (HOSPITAL_COMMUNITY): Payer: Self-pay

## 2015-12-03 DIAGNOSIS — R112 Nausea with vomiting, unspecified: Secondary | ICD-10-CM

## 2015-12-03 DIAGNOSIS — Z8742 Personal history of other diseases of the female genital tract: Secondary | ICD-10-CM | POA: Diagnosis not present

## 2015-12-03 DIAGNOSIS — Z7951 Long term (current) use of inhaled steroids: Secondary | ICD-10-CM | POA: Diagnosis not present

## 2015-12-03 DIAGNOSIS — R1013 Epigastric pain: Secondary | ICD-10-CM | POA: Insufficient documentation

## 2015-12-03 DIAGNOSIS — F1010 Alcohol abuse, uncomplicated: Secondary | ICD-10-CM | POA: Diagnosis not present

## 2015-12-03 DIAGNOSIS — Z8701 Personal history of pneumonia (recurrent): Secondary | ICD-10-CM | POA: Diagnosis not present

## 2015-12-03 DIAGNOSIS — F329 Major depressive disorder, single episode, unspecified: Secondary | ICD-10-CM | POA: Insufficient documentation

## 2015-12-03 DIAGNOSIS — I10 Essential (primary) hypertension: Secondary | ICD-10-CM | POA: Diagnosis not present

## 2015-12-03 DIAGNOSIS — Z79899 Other long term (current) drug therapy: Secondary | ICD-10-CM | POA: Diagnosis not present

## 2015-12-03 DIAGNOSIS — D649 Anemia, unspecified: Secondary | ICD-10-CM | POA: Diagnosis not present

## 2015-12-03 DIAGNOSIS — R011 Cardiac murmur, unspecified: Secondary | ICD-10-CM | POA: Insufficient documentation

## 2015-12-03 DIAGNOSIS — F419 Anxiety disorder, unspecified: Secondary | ICD-10-CM | POA: Insufficient documentation

## 2015-12-03 DIAGNOSIS — Z87891 Personal history of nicotine dependence: Secondary | ICD-10-CM | POA: Diagnosis not present

## 2015-12-03 DIAGNOSIS — K219 Gastro-esophageal reflux disease without esophagitis: Secondary | ICD-10-CM | POA: Diagnosis not present

## 2015-12-03 LAB — CBC WITH DIFFERENTIAL/PLATELET
Basophils Absolute: 0 10*3/uL (ref 0.0–0.1)
Basophils Relative: 1 %
Eosinophils Absolute: 0 10*3/uL (ref 0.0–0.7)
Eosinophils Relative: 0 %
HCT: 35 % — ABNORMAL LOW (ref 36.0–46.0)
Hemoglobin: 11.6 g/dL — ABNORMAL LOW (ref 12.0–15.0)
Lymphocytes Relative: 29 %
Lymphs Abs: 1.1 10*3/uL (ref 0.7–4.0)
MCH: 32.1 pg (ref 26.0–34.0)
MCHC: 33.1 g/dL (ref 30.0–36.0)
MCV: 97 fL (ref 78.0–100.0)
Monocytes Absolute: 0.3 10*3/uL (ref 0.1–1.0)
Monocytes Relative: 7 %
Neutro Abs: 2.4 10*3/uL (ref 1.7–7.7)
Neutrophils Relative %: 63 %
Platelets: 72 10*3/uL — ABNORMAL LOW (ref 150–400)
RBC: 3.61 MIL/uL — ABNORMAL LOW (ref 3.87–5.11)
RDW: 15.1 % (ref 11.5–15.5)
WBC: 3.7 10*3/uL — ABNORMAL LOW (ref 4.0–10.5)

## 2015-12-03 LAB — COMPREHENSIVE METABOLIC PANEL
ALT: 50 U/L (ref 14–54)
AST: 113 U/L — ABNORMAL HIGH (ref 15–41)
Albumin: 3.6 g/dL (ref 3.5–5.0)
Alkaline Phosphatase: 125 U/L (ref 38–126)
Anion gap: 20 — ABNORMAL HIGH (ref 5–15)
BUN: 6 mg/dL (ref 6–20)
CO2: 19 mmol/L — ABNORMAL LOW (ref 22–32)
Calcium: 8.8 mg/dL — ABNORMAL LOW (ref 8.9–10.3)
Chloride: 104 mmol/L (ref 101–111)
Creatinine, Ser: 0.37 mg/dL — ABNORMAL LOW (ref 0.44–1.00)
GFR calc Af Amer: >60 mL/min (ref >60)
GFR calc non Af Amer: >60 mL/min (ref >60)
Glucose, Bld: 91 mg/dL (ref 65–99)
Potassium: 3.3 mmol/L — ABNORMAL LOW (ref 3.5–5.1)
Sodium: 143 mmol/L (ref 135–145)
Total Bilirubin: 2.5 mg/dL — ABNORMAL HIGH (ref 0.3–1.2)
Total Protein: 7.4 g/dL (ref 6.5–8.1)

## 2015-12-03 LAB — LIPASE, BLOOD: Lipase: 50 U/L (ref 11–51)

## 2015-12-03 MED ORDER — SODIUM CHLORIDE 0.9 % IV BOLUS (SEPSIS)
1000.0000 mL | Freq: Once | INTRAVENOUS | Status: AC
  Administered 2015-12-03: 1000 mL via INTRAVENOUS

## 2015-12-03 MED ORDER — PROMETHAZINE HCL 25 MG/ML IJ SOLN
12.5000 mg | Freq: Once | INTRAMUSCULAR | Status: AC
  Administered 2015-12-03: 12.5 mg via INTRAVENOUS
  Filled 2015-12-03: qty 1

## 2015-12-03 MED ORDER — DIAZEPAM 5 MG/ML IJ SOLN
5.0000 mg | Freq: Once | INTRAMUSCULAR | Status: AC
  Administered 2015-12-03: 5 mg via INTRAVENOUS
  Filled 2015-12-03: qty 2

## 2015-12-03 MED ORDER — ONDANSETRON HCL 4 MG PO TABS: 4.0000 mg | ORAL_TABLET | Freq: Four times a day (QID) | ORAL | Status: DC

## 2015-12-03 NOTE — ED Provider Notes (Signed)
CSN: [id number]     Arrival date & time 12/03/15  1026 History   First MD Initiated Contact with Patient 12/03/15 1045     Chief Complaint  Patient presents with  . Emesis     (Consider location/radiation/quality/duration/timing/severity/associated sxs/prior Treatment) HPI   40 year old female with history of alcohol abuse for approximately 14 years, alcoholic cirrhosis with h/o esophageal varices and GI bleed. Last EGD appears to have been almost exactly 1 y ago at [facility].  "only revealed a variceal banding scar but no active bleeding." She began taking Glucotrol yesterday for alcohol abuse. Last night she began having nausea and vomiting. She did notice some streaking of bright red blood. She felt that she may be withdrawing so she subsequently began to drink alcohol. No further emesis since then. Generally feels achy/poor. She feels like she is going through withdrawal symptoms.  Past Medical History  Diagnosis Date  . Depression   . Pneumonia   . Cirrhosis ([place])   . Portal hypertension ([place])   . S/P alcohol detoxification     2-3 days at behavioral health previously  . Alcoholism ([place])   . Anxiety   . GERD (gastroesophageal reflux disease)   . 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 Menorrhagia   . Pancytopenia ([place]) 01/15/2014  . UGI bleed 06/12/2014  . Blood transfusion without reported diagnosis   . Heart murmur     Patient states she may have  . Anemia    Past Surgical History  Procedure Laterality Date  . Cholecystectomy    . Esophagogastroduodenoscopy N/A 06/12/2014    Procedure: ESOPHAGOGASTRODUODENOSCOPY (EGD);  Surgeon: Gatha Mayer, MD;  Location: [facility];  Service: Endoscopy;  Laterality: N/A;  . Esophagogastroduodenoscopy (egd) with propofol N/A 07/29/2014    Procedure: ESOPHAGOGASTRODUODENOSCOPY (EGD) WITH PROPOFOL;  Surgeon: Inda Castle, MD;  Location: [facility];  Service: Endoscopy;  Laterality: N/A;   Family History  Problem  Relation Age of Onset  . Colon polyps Mother   . Hypertension Mother   . Thyroid disease Mother   . Alcoholism Mother   . Alcoholism Father   . Alcohol abuse Maternal Grandfather   . Alcohol abuse Paternal Grandfather   . Alcohol abuse Paternal Aunt   . Alcohol abuse Maternal Uncle    Social History  Substance Use Topics  . Smoking status: Former Smoker -- 0.25 packs/day for .5 years    Types: Cigarettes  . Smokeless tobacco: Never Used     Comment: smokes 3 times a week  . Alcohol Use: 0.0 oz/week    0 Standard drinks or equivalent per week     Comment: . Usually drinks 2 bottle of wine when drinking.    OB History    Gravida Para Term Preterm AB TAB SAB Ectopic Multiple Living            2     Review of Systems  All systems reviewed and negative, other than as noted in HPI.   Allergies  Morphine and related; Morphine and related; and Nsaids  Home Medications   Prior to Admission medications   Medication Sig Start Date End Date Taking? Authorizing Provider  baclofen (LIORESAL) 10 MG tablet Take 1 tablet (10 mg total) by mouth 3 (three) times daily. 11/05/15   Dalia Heading, PA-C  chlordiazePOXIDE (LIBRIUM) 10 MG capsule Take 1 capsule (10 mg total) by mouth 3 (three) times daily as needed for anxiety. 11/05/15   Dalia Heading, PA-C  ciprofloxacin (CIPRO) 500 MG tablet  Take 1 tablet (500 mg total) by mouth 2 (two) times daily. Patient not taking: Reported on 06/05/2015 05/10/15   Mackie Pai, PA-C  clonazePAM (KLONOPIN) 0.5 MG tablet Take 1 tablet (0.5 mg total) by mouth at bedtime. Patient not taking: Reported on 05/13/2015 02/11/15   Mackie Pai, PA-C  fluticasone [facility]) 50 MCG/ACT nasal spray Place 2 sprays into both nostrils daily. Patient not taking: Reported on 10/24/2015 05/08/15   Mackie Pai, PA-C  folic acid (FOLVITE) 1 MG tablet Take 1 tablet (1 mg total) by mouth daily. For folate deficiency Patient not taking: Reported on 05/13/2015 12/25/14    Shuvon B Rankin, NP  LORazepam (ATIVAN) 1 MG tablet Take 1 tablet (1 mg total) by mouth every 6 (six) hours as needed for anxiety (agitation and withdrawal symptoms). 10/24/15   Gloriann Loan, PA-C  Multiple Vitamin (MULTIVITAMIN WITH MINERALS) TABS tablet Take 1 tablet by mouth daily. For nutritional supplement Patient not taking: Reported on 10/24/2015 12/25/14   Shuvon B Rankin, NP  Multiple Vitamins-Minerals (MULTIVITAMIN WITH MINERALS) tablet Take 1 tablet by mouth daily. 10/24/15   Gloriann Loan, PA-C  Naltrexone (VIVITROL) 380 MG SUSR Inject 380 mg into the muscle every 30 (thirty) days. 09/07/15   Historical Provider, MD  pantoprazole (PROTONIX) 40 MG tablet Take 1 tablet (40 mg total) by mouth daily. For reflux Patient not taking: Reported on 05/13/2015 04/16/15   Inda Castle, MD  QUEtiapine (SEROQUEL XR) 50 MG TB24 24 hr tablet Take 1 tablet (50 mg total) by mouth daily. Patient not taking: Reported on 10/24/2015 10/07/15   Dara Hoyer, PA-C   BP 105/59 mmHg  Pulse 66  Temp(Src) 98.2 F (36.8 C) (Oral)  Resp 17  SpO2 99% Physical Exam  Constitutional: She appears well-developed and well-nourished. No distress.  HENT:  Head: Normocephalic and atraumatic.  Oropharynx is clear. No obvious blood or dark appearing material  Eyes: Conjunctivae are normal. Right eye exhibits no discharge. Left eye exhibits no discharge.  Neck: Neck supple.  Cardiovascular: Normal rate, regular rhythm and normal heart sounds.  Exam reveals no gallop and no friction rub.   No murmur heard. Pulmonary/Chest: Effort normal and breath sounds normal. No respiratory distress.  Abdominal: Soft. She exhibits no distension. There is tenderness.  Mild epigastric tenderness.  Musculoskeletal: She exhibits no edema or tenderness.  Neurological: She is alert.  Skin: Skin is warm and dry.  Psychiatric: She has a normal mood and affect. Her behavior is normal. Thought content normal.  Nursing note and vitals  reviewed.   ED Course  Procedures (including critical care time) Labs Review Labs Reviewed - No data to display  Imaging Review No results found. I have personally reviewed and evaluated these images and lab results as part of my medical decision-making.   EKG Interpretation None      MDM   Final diagnoses:  Non-intractable vomiting with nausea, vomiting of unspecified type  Alcohol abuse    40 year old female with nausea and vomiting in setting of alcohol abuse. She reports a small amount of blood in her emesis earlier. She does have a history of known varices. She has had no further emesis while in the emergency room. The amount she describes earlier sounds relatively minimal. Her H&H is stable from recent labs. 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 Her platelets are actually higher than recently though. Although she is at high risk I feel she is appropriate for discharge at this time. She continues to  complain that she feels like she is withdrawing and requesting medications for this. She is not displaying any overtly increased autonomic symptoms such as tremor, tachycardia or hypertension. She was given dose of Valium in the emergency room but further treatment was deferred.  Virgel Manifold, MD 12/16/15 1256

## 2015-12-03 NOTE — ED Notes (Signed)
Bed: [id number] Expected date:  Expected time:  Means of arrival:  Comments: EMS- 40yo F, allergic reaction/emesis

## 2015-12-03 NOTE — ED Notes (Signed)
Per EMS, pt from home.  Pt c/o n/v.  Pt was given Vivitrol yesterday for ETOH abuse.  Pt started having n/v last night. No abdominal pain or fever.  Some diarrhea this morning.  Bright red blood in emesis per family.  No emesis in route.  IV lt hand 20 g.  zofran 4 mg in route.  Vitals:  106/62, hr 64, resp 18, 100% ra.  cbg 112.

## 2016-02-20 ENCOUNTER — Telehealth: Payer: Self-pay | Admitting: Medical

## 2016-02-20 NOTE — Telephone Encounter (Signed)
I saw pt 04-2015. She has been to ED various time but she has yet to ever see me for follow up. Will you call her and see does she have intention of ever following up. Encourage to follow up. Let me know her response. If does follow up needs 30 minutes.

## 2016-02-22 NOTE — Telephone Encounter (Signed)
Left message for pt to call back and set up a ER follow up in the next week.

## 2016-02-23 NOTE — Telephone Encounter (Signed)
Letter mailed to patient 02/23/16.

## 2016-03-02 ENCOUNTER — Encounter: Payer: No Typology Code available for payment source | Admitting: Medical

## 2016-03-02 ENCOUNTER — Telehealth: Payer: Self-pay | Admitting: Medical

## 2016-03-02 DIAGNOSIS — Z0289 Encounter for other administrative examinations: Secondary | ICD-10-CM

## 2016-03-02 NOTE — Progress Notes (Signed)
This encounter was created in error - please disregard.

## 2016-03-03 ENCOUNTER — Encounter: Payer: Self-pay | Admitting: Medical

## 2016-03-03 NOTE — Telephone Encounter (Signed)
Marked to charge and mailing no show letter °

## 2016-03-03 NOTE — Telephone Encounter (Signed)
Pt was no show 03/02/16 9:00am for f/u appt, pt has not rescheduled, 3rd no show w/in 12 months, charge or no charge?

## 2016-03-03 NOTE — Telephone Encounter (Signed)
charge 

## 2016-03-07 ENCOUNTER — Telehealth: Payer: Self-pay | Admitting: Medical

## 2016-03-07 NOTE — Telephone Encounter (Signed)
Patient dismissed from [facility] by Ila Mcgill PA-C, effective April 12. 2017. Dismissal letter sent out by certified / registered mail.  [facility]  Certified dismissal letter returned as undeliverable, unclaimed, return to sender after three attempts by [place]. Letter placed in another envelope and resent as 1st class mail which does not require a signature. Returned certified mail scanned under media tab 04/11/16 DAJ

## 2016-03-08 ENCOUNTER — Emergency Department (HOSPITAL_COMMUNITY)
Admission: EM | Admit: 2016-03-08 | Discharge: 2016-03-08 | Disposition: A | Discharge status: Home / Self Care | Payer: No Typology Code available for payment source | Attending: Emergency Medicine | Admitting: Emergency Medicine

## 2016-03-08 ENCOUNTER — Encounter (HOSPITAL_COMMUNITY): Payer: Self-pay

## 2016-03-08 DIAGNOSIS — F419 Anxiety disorder, unspecified: Secondary | ICD-10-CM | POA: Diagnosis not present

## 2016-03-08 DIAGNOSIS — Z8701 Personal history of pneumonia (recurrent): Secondary | ICD-10-CM | POA: Insufficient documentation

## 2016-03-08 DIAGNOSIS — D649 Anemia, unspecified: Secondary | ICD-10-CM | POA: Insufficient documentation

## 2016-03-08 DIAGNOSIS — M545 Low back pain: Secondary | ICD-10-CM | POA: Diagnosis present

## 2016-03-08 DIAGNOSIS — R011 Cardiac murmur, unspecified: Secondary | ICD-10-CM | POA: Insufficient documentation

## 2016-03-08 DIAGNOSIS — Z87891 Personal history of nicotine dependence: Secondary | ICD-10-CM | POA: Diagnosis not present

## 2016-03-08 DIAGNOSIS — Z8719 Personal history of other diseases of the digestive system: Secondary | ICD-10-CM | POA: Diagnosis not present

## 2016-03-08 DIAGNOSIS — M5416 Radiculopathy, lumbar region: Secondary | ICD-10-CM | POA: Diagnosis not present

## 2016-03-08 DIAGNOSIS — Z8742 Personal history of other diseases of the female genital tract: Secondary | ICD-10-CM | POA: Diagnosis not present

## 2016-03-08 DIAGNOSIS — F329 Major depressive disorder, single episode, unspecified: Secondary | ICD-10-CM | POA: Insufficient documentation

## 2016-03-08 MED ORDER — METHOCARBAMOL 500 MG PO TABS
500.0000 mg | ORAL_TABLET | Freq: Once | ORAL | Status: AC
  Administered 2016-03-08: 500 mg via ORAL
  Filled 2016-03-08: qty 1

## 2016-03-08 MED ORDER — DICLOFENAC SODIUM 1 % TD GEL: 2.0000 g | Freq: Four times a day (QID) | TRANSDERMAL | Status: DC

## 2016-03-08 MED ORDER — METHOCARBAMOL 500 MG PO TABS: 500.0000 mg | ORAL_TABLET | Freq: Two times a day (BID) | ORAL | Status: DC

## 2016-03-08 NOTE — ED Provider Notes (Signed)
CSN: [id number]     Arrival date & time 03/08/16  1732 History   First MD Initiated Contact with Patient 03/08/16 1742     No chief complaint on file.    (Consider location/radiation/quality/duration/timing/severity/associated sxs/prior Treatment) HPI  Blood pressure 125/89, pulse 96, temperature 97.9 F (36.6 C), temperature source Oral, resp. rate 18, SpO2 97 %.  Sara [name] is a 40 y.o. female complaining of Low back pain onset 2 days ago radiating down the right leg to the toes, exacerbated by movement, palpation and certain positions. Patient is been taking Aleve at home with little relief. No prior history of trauma or back pain. Denies fever, chills, change in bowel or bladder habits, h/o IDVU or cancer, numbness or weakness.    Past Medical History  Diagnosis Date  . Depression   . Pneumonia   . Cirrhosis ([place])   . Portal hypertension ([place])   . S/P alcohol detoxification     2-3 days at behavioral health previously  . Alcoholism ([place])   . Anxiety   . GERD (gastroesophageal reflux disease)   . 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 Menorrhagia   . Pancytopenia ([place]) 01/15/2014  . UGI bleed 06/12/2014  . Blood transfusion without reported diagnosis   . Heart murmur     Patient states she may have  . Anemia    Past Surgical History  Procedure Laterality Date  . Cholecystectomy    . Esophagogastroduodenoscopy N/A 06/12/2014    Procedure: ESOPHAGOGASTRODUODENOSCOPY (EGD);  Surgeon: Gatha Mayer, MD;  Location: [facility];  Service: Endoscopy;  Laterality: N/A;  . Esophagogastroduodenoscopy (egd) with propofol N/A 07/29/2014    Procedure: ESOPHAGOGASTRODUODENOSCOPY (EGD) WITH PROPOFOL;  Surgeon: Inda Castle, MD;  Location: [facility];  Service: Endoscopy;  Laterality: N/A;   Family History  Problem Relation Age of Onset  . Colon polyps Mother   . Hypertension Mother   . Thyroid disease Mother   . Alcoholism Mother   . Alcoholism Father   .  Alcohol abuse Maternal Grandfather   . Alcohol abuse Paternal Grandfather   . Alcohol abuse Paternal Aunt   . Alcohol abuse Maternal Uncle    Social History  Substance Use Topics  . Smoking status: Former Smoker -- 0.25 packs/day for .5 years    Types: Cigarettes  . Smokeless tobacco: Never Used     Comment: smokes 3 times a week  . Alcohol Use: 0.0 oz/week    0 Standard drinks or equivalent per week     Comment: . Usually drinks 2 bottle of wine when drinking.    OB History    Gravida Para Term Preterm AB TAB SAB Ectopic Multiple Living            2     Review of Systems  10 systems reviewed and found to be negative, except as noted in the HPI.  Allergies  Morphine and related; Morphine and related; and Nsaids  Home Medications   Prior to Admission medications   Medication Sig Start Date End Date Taking? Authorizing Provider  baclofen (LIORESAL) 10 MG tablet Take 1 tablet (10 mg total) by mouth 3 (three) times daily. Patient not taking: Reported on 03/08/2016 11/05/15   Dalia Heading, PA-C  chlordiazePOXIDE (LIBRIUM) 10 MG capsule Take 1 capsule (10 mg total) by mouth 3 (three) times daily as needed for anxiety. Patient not taking: Reported on 03/08/2016 11/05/15   Dalia Heading, PA-C  clonazePAM (KLONOPIN) 0.5 MG tablet Take 1 tablet (0.5 mg  total) by mouth at bedtime. Patient not taking: Reported on 05/13/2015 02/11/15   Mackie Pai, PA-C  diclofenac sodium (VOLTAREN) 1 % GEL Apply 2 g topically 4 (four) times daily. 03/08/16   Azariah Latendresse, PA-C  fluticasone (FLONASE) 50 MCG/ACT nasal spray Place 2 sprays into both nostrils daily. Patient not taking: Reported on 10/24/2015 05/08/15   Mackie Pai, PA-C  folic acid (FOLVITE) 1 MG tablet Take 1 tablet (1 mg total) by mouth daily. For folate deficiency Patient not taking: Reported on 03/08/2016 12/25/14   Shuvon B Rankin, NP  LORazepam (ATIVAN) 1 MG tablet Take 1 tablet (1 mg total) by mouth every 6 (six) hours as  needed for anxiety (agitation and withdrawal symptoms). Patient not taking: Reported on 03/08/2016 10/24/15   Gloriann Loan, PA-C  methocarbamol (ROBAXIN) 500 MG tablet Take 1 tablet (500 mg total) by mouth 2 (two) times daily. 03/08/16   Ailea Rhatigan, PA-C  Multiple Vitamins-Minerals (MULTIVITAMIN WITH MINERALS) tablet Take 1 tablet by mouth daily. Patient not taking: Reported on 03/08/2016 10/24/15   Gloriann Loan, PA-C  ondansetron (ZOFRAN) 4 MG tablet Take 1 tablet (4 mg total) by mouth every 6 (six) hours. Patient not taking: Reported on 03/08/2016 12/03/15   Virgel Manifold, MD  pantoprazole (PROTONIX) 40 MG tablet Take 1 tablet (40 mg total) by mouth daily. For reflux Patient not taking: Reported on 05/13/2015 04/16/15   Inda Castle, MD  QUEtiapine (SEROQUEL XR) 50 MG TB24 24 hr tablet Take 1 tablet (50 mg total) by mouth daily. Patient not taking: Reported on 10/24/2015 10/07/15   Dara Hoyer, PA-C   BP 125/89 mmHg  Pulse 96  Temp(Src) 97.9 F (36.6 C) (Oral)  Resp 18  SpO2 97% Physical Exam  Constitutional: She appears well-developed and well-nourished.  HENT:  Head: Normocephalic.  Eyes: Conjunctivae are normal.  Neck: Normal range of motion.  Cardiovascular: Normal rate, regular rhythm and intact distal pulses.   Pulmonary/Chest: Effort normal.  Abdominal: Soft. There is no tenderness.  Neurological: She is alert.  No point tenderness to percussion of lumbar spinal processes.  No TTP or paraspinal muscular spasm. Strength is 5 out of 5 to bilateral lower extremities at hip and knee; extensor hallucis longus 5 out of 5. Ankle strength 5 out of 5, no clonus, neurovascularly intact. No saddle anaesthesia. Patellar reflexes are 2+ bilaterally.    Trait leg raise is positive on the right at 45, negative on the left.   Psychiatric: She has a normal mood and affect.  Nursing note and vitals reviewed.   ED Course  Procedures (including critical care time) Labs Review Labs  Reviewed - No data to display  Imaging Review No results found. I have personally reviewed and evaluated these images and lab results as part of my medical decision-making.   EKG Interpretation None      MDM   Final diagnoses:  Lumbar radiculopathy, acute    Filed Vitals:   03/08/16 1735  BP: 125/89  Pulse: 96  Temp: 97.9 F (36.6 C)  TempSrc: Oral  Resp: 18  SpO2: 97%    Medications  methocarbamol (ROBAXIN) tablet 500 mg (not administered)    Sara [name] is 40 y.o. female presenting with  back pain.  No neurological deficits and normal neuro exam.  Patient can walk but states is painful.  No loss of bowel or bladder control.  No concern for cauda equina.  No fever, night sweats, weight loss, h/o cancer, IVDU. Extensive discussion of options  for pain control, patient has history of GI bleed requiring transfusions, alcoholic cirrhosis, has history of hypotensive reactions to narcotics. Will try Voltaren gel and Robaxin. Patient will follow with primary care.  Evaluation does not show pathology that would require ongoing emergent intervention or inpatient treatment. Pt is hemodynamically stable and mentating appropriately. Discussed findings and plan with patient/guardian, who agrees with care plan. All questions answered. Return precautions discussed and outpatient follow up given.   New Prescriptions   DICLOFENAC SODIUM (VOLTAREN) 1 % GEL    Apply 2 g topically 4 (four) times daily.   METHOCARBAMOL (ROBAXIN) 500 MG TABLET    Take 1 tablet (500 mg total) by mouth 2 (two) times daily.         Monico Blitz, PA-C 03/08/16 [place], MD 03/08/16 [phone]

## 2016-03-08 NOTE — ED Notes (Signed)
Per family and previous providers, pt has hx of ETOH abuse with cirrhosis of the liver.  Pt transferred to acute side.  Family member states pt has probably been drinking today.

## 2016-03-08 NOTE — Discharge Instructions (Signed)
For breakthrough pain you may take Robaxin. Do not drink alcohol, drive or operate heavy machinery when taking Robaxin.  Please follow with your primary care doctor in the next 2 days for a check-up. They must obtain records for further management.   Do not hesitate to return to the Emergency Department for any new, worsening or concerning symptoms.    Lumbosacral Radiculopathy Lumbosacral radiculopathy is a condition that involves the spinal nerves and nerve roots in the low back and bottom of the spine. The condition develops when these nerves and nerve roots move out of place or become inflamed and cause symptoms. CAUSES This condition may be caused by:  Pressure from a disk that bulges out of place (herniated disk). A disk is a plate of cartilage that separates bones in the spine.  Disk degeneration.  A narrowing of the bones of the lower back (spinal stenosis).  A tumor.  An infection.  An injury that places sudden pressure on the disks that cushion the bones of your lower spine. RISK FACTORS This condition is more likely to develop in:  Males aged 30-50 years.  Females aged 29-60 years.  People who lift improperly.  People who are overweight or live a sedentary lifestyle.  People who smoke.  People who perform repetitive activities that strain the spine. SYMPTOMS Symptoms of this condition include:  Pain that goes down from the back into the legs (sciatica). This is the most common symptom. The pain may be worse with sitting, coughing, or sneezing.  Pain and numbness in the arms and legs.  Muscle weakness.  Tingling.  Loss of bladder control or bowel control. DIAGNOSIS This condition is diagnosed with a physical exam and medical history. If the pain is lasting, you may have tests, such as:  MRI scan.  X-ray.  CT scan.  Myelogram.  Nerve conduction study. TREATMENT This condition is often treated with:  Hot packs and ice applied to affected  areas.  Stretches to improve flexibility.  Exercises to strengthen back muscles.  Physical therapy.  Pain medicine.  A steroid injection in the spine. In some cases, no treatment is needed. If the condition is long-lasting (chronic), or if symptoms are severe, treatment may involve surgery or lifestyle changes, such as following a weight loss plan. HOME CARE INSTRUCTIONS Medicines  Take medicines only as directed by your health care provider.  Do not drive or operate heavy machinery while taking pain medicine. Injury Care  Apply a heat pack to the injured area as directed by your health care provider.  Apply ice to the affected area:  Put ice in a plastic bag.  Place a towel between your skin and the bag.  Leave the ice on for 20-30 minutes, every 2 hours while you are awake or as needed. Or, leave the ice on for as long as directed by your health care provider. Other Instructions  If you were shown how to do any exercises or stretches, do them as directed by your health care provider.  If your health care provider prescribed a diet or exercise program, follow it as directed.  Keep all follow-up visits as directed by your health care provider. This is important. SEEK MEDICAL CARE IF:  Your pain does not improve over time even when taking pain medicines. SEEK IMMEDIATE MEDICAL CARE IF:  Your develop severe pain.  Your pain suddenly gets worse.  You develop increasing weakness in your legs.  You lose the ability to control your bladder or bowel.  You have difficulty walking or balancing.  You have a fever.   This information is not intended to replace advice given to you by your health care provider. Make sure you discuss any questions you have with your health care provider.   Document Released: 11/07/2005 Document Revised: 03/24/2015 Document Reviewed: 11/03/2014 Elsevier Interactive Patient Education [employer].

## 2016-03-08 NOTE — ED Notes (Signed)
Pt states back radiating down right leg.  Started x 2 days ago.  No fall or injury.  No change in urination or increased physical activity.

## 2016-03-08 NOTE — ED Notes (Signed)
Patient was alert, oriented and stable upon discharge. RN went over AVS and patient had no further questions.  

## 2016-03-15 ENCOUNTER — Emergency Department (HOSPITAL_BASED_OUTPATIENT_CLINIC_OR_DEPARTMENT_OTHER)
Admission: EM | Admit: 2016-03-15 | Discharge: 2016-03-15 | Disposition: A | Discharge status: Home / Self Care | Payer: No Typology Code available for payment source | Attending: Emergency Medicine | Admitting: Emergency Medicine

## 2016-03-15 ENCOUNTER — Encounter (HOSPITAL_BASED_OUTPATIENT_CLINIC_OR_DEPARTMENT_OTHER): Payer: Self-pay | Admitting: *Deleted

## 2016-03-15 DIAGNOSIS — Z87891 Personal history of nicotine dependence: Secondary | ICD-10-CM | POA: Insufficient documentation

## 2016-03-15 DIAGNOSIS — Z862 Personal history of diseases of the blood and blood-forming organs and certain disorders involving the immune mechanism: Secondary | ICD-10-CM | POA: Diagnosis not present

## 2016-03-15 DIAGNOSIS — Z8719 Personal history of other diseases of the digestive system: Secondary | ICD-10-CM | POA: Diagnosis not present

## 2016-03-15 DIAGNOSIS — Z8742 Personal history of other diseases of the female genital tract: Secondary | ICD-10-CM | POA: Insufficient documentation

## 2016-03-15 DIAGNOSIS — F10120 Alcohol abuse with intoxication, uncomplicated: Secondary | ICD-10-CM | POA: Diagnosis not present

## 2016-03-15 DIAGNOSIS — Z791 Long term (current) use of non-steroidal anti-inflammatories (NSAID): Secondary | ICD-10-CM | POA: Insufficient documentation

## 2016-03-15 DIAGNOSIS — R000 Tachycardia, unspecified: Secondary | ICD-10-CM | POA: Diagnosis not present

## 2016-03-15 DIAGNOSIS — F10920 Alcohol use, unspecified with intoxication, uncomplicated: Secondary | ICD-10-CM

## 2016-03-15 DIAGNOSIS — Z8701 Personal history of pneumonia (recurrent): Secondary | ICD-10-CM | POA: Insufficient documentation

## 2016-03-15 DIAGNOSIS — R011 Cardiac murmur, unspecified: Secondary | ICD-10-CM | POA: Insufficient documentation

## 2016-03-15 DIAGNOSIS — F10129 Alcohol abuse with intoxication, unspecified: Secondary | ICD-10-CM | POA: Diagnosis present

## 2016-03-15 DIAGNOSIS — Z79899 Other long term (current) drug therapy: Secondary | ICD-10-CM | POA: Diagnosis not present

## 2016-03-15 DIAGNOSIS — R140 Abdominal distension (gaseous): Secondary | ICD-10-CM | POA: Diagnosis not present

## 2016-03-15 LAB — COMPREHENSIVE METABOLIC PANEL
ALK PHOS: 134 U/L — AB (ref 38–126)
ALT: 84 U/L — ABNORMAL HIGH (ref 14–54)
AST: 202 U/L — AB (ref 15–41)
Albumin: 4.2 g/dL (ref 3.5–5.0)
Anion gap: 16 — ABNORMAL HIGH (ref 5–15)
BILIRUBIN TOTAL: 2.4 mg/dL — AB (ref 0.3–1.2)
BUN: 6 mg/dL (ref 6–20)
CALCIUM: 8.5 mg/dL — AB (ref 8.9–10.3)
CO2: 18 mmol/L — ABNORMAL LOW (ref 22–32)
CREATININE: 0.37 mg/dL — AB (ref 0.44–1.00)
Chloride: 106 mmol/L (ref 101–111)
GFR calc Af Amer: >60 mL/min (ref >60)
GLUCOSE: 110 mg/dL — AB (ref 65–99)
POTASSIUM: 3.7 mmol/L (ref 3.5–5.1)
Sodium: 140 mmol/L (ref 135–145)
TOTAL PROTEIN: 8.6 g/dL — AB (ref 6.5–8.1)

## 2016-03-15 LAB — CBC WITH DIFFERENTIAL/PLATELET
BASOS ABS: 0 10*3/uL (ref 0.0–0.1)
BASOS PCT: 0 %
Eosinophils Absolute: 0 10*3/uL (ref 0.0–0.7)
Eosinophils Relative: 0 %
HEMATOCRIT: 38.3 % (ref 36.0–46.0)
HEMOGLOBIN: 13.5 g/dL (ref 12.0–15.0)
LYMPHS ABS: 2.4 10*3/uL (ref 0.7–4.0)
LYMPHS PCT: 47 %
MCH: 33.1 pg (ref 26.0–34.0)
MCHC: 35.2 g/dL (ref 30.0–36.0)
MCV: 93.9 fL (ref 78.0–100.0)
MONOS PCT: 6 %
Monocytes Absolute: 0.3 10*3/uL (ref 0.1–1.0)
NEUTROS ABS: 2.4 10*3/uL (ref 1.7–7.7)
Neutrophils Relative %: 47 %
Platelets: 42 10*3/uL — ABNORMAL LOW (ref 150–400)
RBC: 4.08 MIL/uL (ref 3.87–5.11)
RDW: 16 % — AB (ref 11.5–15.5)
WBC: 5.1 10*3/uL (ref 4.0–10.5)

## 2016-03-15 LAB — SALICYLATE LEVEL: Salicylate Lvl: <4 mg/dL (ref 2.8–30.0)

## 2016-03-15 LAB — ETHANOL: ALCOHOL ETHYL (B): 444 mg/dL — AB (ref <5)

## 2016-03-15 LAB — ACETAMINOPHEN LEVEL: Acetaminophen (Tylenol), Serum: <10 ug/mL — ABNORMAL LOW (ref 10–30)

## 2016-03-15 MED ORDER — ZIPRASIDONE MESYLATE 20 MG IM SOLR
20.0000 mg | Freq: Once | INTRAMUSCULAR | Status: AC
  Administered 2016-03-15: 20 mg via INTRAMUSCULAR
  Filled 2016-03-15: qty 20

## 2016-03-15 MED ORDER — CHLORPROMAZINE HCL 25 MG PO TABS: 25.0000 mg | ORAL_TABLET | Freq: Once | ORAL | Status: DC

## 2016-03-15 MED ORDER — METOCLOPRAMIDE HCL 5 MG/ML IJ SOLN: 10.0000 mg | Freq: Once | INTRAMUSCULAR | Status: DC

## 2016-03-15 MED ORDER — HALOPERIDOL LACTATE 5 MG/ML IJ SOLN: 5.0000 mg | Freq: Once | INTRAMUSCULAR | Status: DC

## 2016-03-15 NOTE — ED Notes (Addendum)
Pt taken to room 14 via wheelchair with staff x 4 and assisted to stretcher. Pt stating that she "wants to leave". When asked why she was brought to ED pt states "for detox". Pt smells strongly of etoh. Explained to pt she could not leave the ED at this time for her safety due to intoxication. Pt's family not present in ED at this time. Pt denies SI/HI when asked. Dr Maryan Rued at bedside to eval

## 2016-03-15 NOTE — ED Notes (Signed)
States she wants to detox from alcohol. Her speech is slurred. Unable to stand. She is sleeping and unable to communicate verbally to give hx.

## 2016-03-15 NOTE — ED Notes (Signed)
Sitter at bedside for safety.

## 2016-03-15 NOTE — ED Notes (Signed)
Patient is resting. Sitter at bedside for safety

## 2016-03-15 NOTE — ED Notes (Signed)
While triaging pt she jumped up out of the wheelchair and states she is not staying. Security was called and pt was assisted back into the w/c by staff and taken to treatment room.

## 2016-03-15 NOTE — ED Notes (Addendum)
When pt was informed that the doctor had ordered a dose of haldol to help her rest she stated "that doesn't work for me but there is another medicine that does". [place] notified

## 2016-03-15 NOTE — ED Notes (Signed)
Pt is repeatedly attempting to climb over bed rails to get out of bed, but is easily redirected and calmed. Kayla EMT and Security officer sitting at bedside for pt safety.

## 2016-03-15 NOTE — ED Notes (Signed)
Pt gave verbal consent and allowed blood draw for labs

## 2016-03-15 NOTE — ED Notes (Signed)
Pt ambulated around dept with steady gait with stand-by assist. Pt able to sit up in bed and eat. Phone given to pt to arrange for transportation home, states her husband will come get her.

## 2016-03-15 NOTE — Discharge Instructions (Signed)
Alcohol Intoxication Alcohol intoxication occurs when you drink enough alcohol that it affects your ability to function. It can be mild or very severe. Drinking a lot of alcohol in a short time is called binge drinking. This can be very harmful. Drinking alcohol can also be more dangerous if you are taking medicines or other drugs. Some of the effects caused by alcohol may include:  Loss of coordination.  Changes in mood and behavior.  Unclear thinking.  Trouble talking (slurred speech).  Throwing up (vomiting).  Confusion.  Slowed breathing.  Twitching and shaking (seizures).  Loss of consciousness. HOME CARE  Do not drive after drinking alcohol.  Drink enough water and fluids to keep your pee (urine) clear or pale yellow. Avoid caffeine.  Only take medicine as told by your doctor. GET HELP IF:  You throw up (vomit) many times.  You do not feel better after a few days.  You frequently have alcohol intoxication. Your doctor can help decide if you should see a substance use treatment counselor. GET HELP RIGHT AWAY IF:  You become shaky when you stop drinking.  You have twitching and shaking.  You throw up blood. It may look bright red or like coffee grounds.  You notice blood in your poop (bowel movements).  You become lightheaded or pass out (faint). MAKE SURE YOU:   Understand these instructions.  Will watch your condition.  Will get help right away if you are not doing well or get worse.   This information is not intended to replace advice given to you by your health care provider. Make sure you discuss any questions you have with your health care provider.   Document Released: 04/25/2008 Document Revised: 07/10/2013 Document Reviewed: 04/12/2013 Elsevier Interactive Patient Education 2016 [facility]/Residential  Substance Abuse Treatment Adults The United Ways 211 is a great source of  information about community services available.  Access by dialing 2-1-1 from anywhere in [place], or by website -  CustodianSupply.fi.   (Updated 11/2015)  Crisis Assistance 24 hours a day   [place]  24-hour crisis assistance: [place], [place]   [facility]  24-hour crisis [place], [place]   24-hour crisis assistance: [place], [place] Access to Care Line  24-hour crisis assistance; [phone] All   [facility]  24-hour crisis response line: [phone] All   Other Local Resources (Updated 11/2015)  Inpatient Behavioral Health/Residential Substance Abuse Treatment Programs   Services      Address and Phone Number  [place] ([place])   14-day residential rehabilitation  [phone] [address] [place], [place] ([place])    Detox - private pay only  14-day residential rehabilitation -  Medicaid, insurance, private pay only [phone], or [place], [place], [place] [address]   [place] only  Multiple facilities [phone] admissions   [facility] ([place])   90-day program  Must be homeless to participate  [phone], or [place], [place] only [phone], or  [place], [place] [address]  [place]     Must make an appointment  Transportation is offered from [place] on [employer].  Accepts private pay, [place] [facility] [phone]  [place]  [address] [place], [place] [address]   [employer]  Females only  Associated with the [place] [phone] [place], [place]  [address]  [facility]   Private insurance only [phone], or [phone] [address] [place], [id number]  [place]    Detox  Residential rehabilitation  Private insurance only  Multiple locations [phone] admissions  [place] of [place] pay  Private insurance [phone] [address] [place] [address]  [facility]    Males only  Fee required at time of admission [place], [place] [address]  [facility] [place] pay only  [phone] [address] [place], [place] (Residential Treatment Services)    Detox - private pay, Medicaid  Residential rehabilitation for males  - Medicare, Medicaid, insurance, private pay [phone] [place], [place] interviews Monday - Saturday from 8 am - 4 pm  Individuals with legal charges are not eligible [phone] [address] [place], [place] [address]  [facility]   Must be willing to work  Must attend Alcoholics Anonymous meetings [phone] [place], [place] program  Private pay only [phone] [place], [place] Counseling/Substance Abuse Adult The United Ways 211 is a great source of information about community services available.  Access by dialing 2-1-1 from anywhere in [place], or by website -  CustodianSupply.fi.   Other Local Resources (Updated 11/2015)  [facility] [facility]  Crisis Hotline, available 24 hours a day, 7 days a week: [place], [place]   [facility]  Crisis Hotline, available 24 hours a day, 7 days a week: [place], [place]  [facility]  Suicide Prevention Hotline, available 24 hours a day, 7 days a week:  [place], [place], available 24 hours a day, 7 days a week: [place], [place] Access to [employer], available 24 hours a day, 7 days a week: [phone] All   [facility]  Crisis Hotline, available 24 hours a day, 7 days a week: [phone] All   Other Local Resources (Updated 11/2015)  Outpatient Counseling/ Substance Abuse Programs  Services     Address and Phone Number  ADS (Alcohol and Drug Services)   Options include Individual counseling, group counseling, intensive outpatient program (several hours a day, several days a week)  Offers depression assessments  Provides methadone maintenance program [phone] [address], [place], [place] partial hospitalization/day treatment and DUI/DWI programs  [employer], private insurance [phone] [phone] [place], [place] [address]  [place] include intensive outpatient program (several hours a day, several days a week), outpatient treatment, DUI/DWI services, family education  Also has some services specifically for [employer] transitional housing  [phone] [address] [place], [place] [address]     [place] Medicare, private pay, and private insurance [phone] [address] [place], [place] [address]  [facility]  Services include individual counseling, substance abuse intensive outpatient program (several hours a day, several days a  week), day treatment  Accepts Medicare, Medicaid, private insurance [phone] [address], [place], [place] [address]  [facility]   Offers substance abuse intensive outpatient program (several hours a day, several days a week), partial  hospitalization program [phone] [address] [place], [place] [address]  [phone] [address] [place], [place] [address]  [phone] [place], [place] [address]  [phone], [place], [place] [address]  [facility]  Individual counseling only  Accepts private insurance only [phone] [address], [place], [place] [address]  [facility]: Methadone Clinic  Methadone maintenance program [phone] N. [place], [place] [address]  [facility] providing substance abuse and mental health counseling  Accepts Medicaid, Medicare, private insurance  Offers sliding scale for uninsured [phone] [place], [place] in [place] individual counseling, and intensive in-home services [phone] [address], [place], [place] [address]  [facility] [place] individual counseling, family counseling, group therapy, domestic violence counseling, consumer credit counseling  Accepts Medicare, Medicaid, private insurance  Offers sliding scale for uninsured [phone] [address] [place], [place] [address]  [phone] [facility], [address] [place], [place]  [facility]  Offers individual, family and group counseling  3 locations - [place], [place], and [place]  [place] [address] [place], [place] [address]  [address] [place], [place] [address]  [place], [place] [address]  [facility] [place] [facility]    Offers psychiatric assessment, 8-week Intensive Outpatient Program (several hours a day, several times a week, daytime or evenings), early recovery group, family Program, medication management  Private pay or private insurance only [phone], or  [phone] [address] [place], [place] [address]  [facility]  Offers  individual, couples and family counseling  Accepts Medicaid, private insurance, and sliding scale for uninsured [phone] [address] [place], [place] [address]  Launa Flight, MD  Individual counseling  Private insurance [phone] [place], [place] [address]  [facility]   Offers assessment, substance abuse treatment, and behavioral health treatment [phone] N. [place], [place] [address]  [place]  Individual counseling  Accepts private insurance [phone] [place], [place] [address]  [facility]  Individual counseling  Accepts Medicare, private insurance [phone] [place], [place] [address]  [place]    Offers intensive outpatient program (several hours a day, several times a week)  Private pay, private insurance [place], [place]  Individual counseling  Medicare, private insurance [phone] [address], [place], [place] [address]  [place]    Offers intensive outpatient program (several hours a day, several times a week) and partial hospitalization program [phone] [place], [place] [address]  Letta Moynahan, MD  Individual counseling [phone] [address] [place], [place] [address]  [place] counseling to individuals, couples, and families  Accepts Medicare and private insurance; offers sliding scale for uninsured [phone] [place], [place] [address]  [facility]  Christian counseling [phone] [address], [place],  [address]  [facility] [employer] crisis counseling, individual counseling, group therapy, in-home therapy, domestic violence  services,  day treatment, DWI services, Conservation officer, nature (CST), Assertive Community Treatment Team (ACTT), substance abuse Intensive Outpatient Program (several hours a day, several times a week)  2 locations - [place] and [place], [place] [address]  [phone] [address] [place], [place] [address]  [place] counseling and group therapy  [employer], [place], [place] [phone] [address] [place], [place]  [facility]  Offers individual and family counseling  Offers LGBTQ services  Accepts private insurance and private pay [phone] [place], [place] [address]  [facility]    Offers individual counseling, group therapy, and outpatient detox  Accepts private insurance [phone] [place], [place] Medicare, private insurance [phone] [address] [place], [place] [address]  [employer]  Individual counseling  Accepts Medicare, private insurance [phone] 2716 [place], [place] [address]  [facility]per[facility] Sheets [place] substance abuse Intensive Outpatient Program (several hours a day, several times a week) [phone], or [phone] [place], [place]

## 2016-03-15 NOTE — ED Notes (Signed)
Walked Pt. Around [employer].  Pt. Did not hiccup at all and no hiccup once Pt. Sitting up in the bed.

## 2016-03-15 NOTE — ED Provider Notes (Signed)
CSN: [id number]     Arrival date & time 03/15/16  1332 History   First MD Initiated Contact with Patient 03/15/16 1351     Chief Complaint  Patient presents with  . Medical Clearance     (Consider location/radiation/quality/duration/timing/severity/associated sxs/prior Treatment) HPI Comments: Patient is a 40 year old female with a history of alcohol abuse, cirrhosis, depression, esophageal varices and anemia presenting today intoxicated. No one is with the patient so is hard to obtain a history but her daughter dropped her off at the front door and left. Patient admits to drinking today but cannot quantify. She has no complaints at this time. She denies any drug use or trauma. She has no pain.  The history is provided by the patient. The history is limited by the condition of the patient.    Past Medical History  Diagnosis Date  . Depression   . Pneumonia   . Cirrhosis ([place])   . Portal hypertension ([place])   . S/P alcohol detoxification     2-3 days at behavioral health previously  . Alcoholism ([place])   . Anxiety   . GERD (gastroesophageal reflux disease)   . 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 Menorrhagia   . Pancytopenia ([place]) 01/15/2014  . UGI bleed 06/12/2014  . Blood transfusion without reported diagnosis   . Heart murmur     Patient states she may have  . Anemia    Past Surgical History  Procedure Laterality Date  . Cholecystectomy    . Esophagogastroduodenoscopy N/A 06/12/2014    Procedure: ESOPHAGOGASTRODUODENOSCOPY (EGD);  Surgeon: Gatha Mayer, MD;  [facility];  Service: Endoscopy;  Laterality: N/A;  . Esophagogastroduodenoscopy (egd) with propofol N/A 07/29/2014    Procedure: ESOPHAGOGASTRODUODENOSCOPY (EGD) WITH PROPOFOL;  Surgeon: Inda Castle, MD;  Location: [facility];  Service: Endoscopy;  Laterality: N/A;   Family History  Problem Relation Age of Onset  . Colon polyps Mother   . Hypertension Mother   . Thyroid disease Mother    . Alcoholism Mother   . Alcoholism Father   . Alcohol abuse Maternal Grandfather   . Alcohol abuse Paternal Grandfather   . Alcohol abuse Paternal Aunt   . Alcohol abuse Maternal Uncle    Social History  Substance Use Topics  . Smoking status: Former Smoker -- 0.25 packs/day for .5 years    Types: Cigarettes  . Smokeless tobacco: Never Used     Comment: smokes 3 times a week  . Alcohol Use: 0.0 oz/week    0 Standard drinks or equivalent per week     Comment: . Usually drinks 2 bottle of wine when drinking.    OB History    Gravida Para Term Preterm AB TAB SAB Ectopic Multiple Living            2     Review of Systems  Unable to perform ROS: Mental status change      Allergies  Morphine and related; Morphine and related; and Nsaids  Home Medications   Prior to Admission medications   Medication Sig Start Date End Date Taking? Authorizing Provider  baclofen (LIORESAL) 10 MG tablet Take 1 tablet (10 mg total) by mouth 3 (three) times daily. Patient not taking: Reported on 03/08/2016 11/05/15   Dalia Heading, PA-C  chlordiazePOXIDE (LIBRIUM) 10 MG capsule Take 1 capsule (10 mg total) by mouth 3 (three) times daily as needed for anxiety. Patient not taking: Reported on 03/08/2016 11/05/15   Dalia Heading, PA-C  clonazePAM [place]bye [place]) 0.5  MG tablet Take 1 tablet (0.5 mg total) by mouth at bedtime. Patient not taking: Reported on 05/13/2015 02/11/15   Mackie Pai, PA-C  diclofenac sodium (VOLTAREN) 1 % GEL Apply 2 g topically 4 (four) times daily. 03/08/16   Nicole Pisciotta, PA-C  fluticasone (FLONASE) 50 MCG/ACT nasal spray Place 2 sprays into both nostrils daily. Patient not taking: Reported on 10/24/2015 05/08/15   Mackie Pai, PA-C  folic acid (FOLVITE) 1 MG tablet Take 1 tablet (1 mg total) by mouth daily. For folate deficiency Patient not taking: Reported on 03/08/2016 12/25/14   Shuvon B Rankin, NP  LORazepam (ATIVAN) 1 MG tablet Take 1 tablet (1 mg total) by  mouth every 6 (six) hours as needed for anxiety (agitation and withdrawal symptoms). Patient not taking: Reported on 03/08/2016 10/24/15   Gloriann Loan, PA-C  methocarbamol (ROBAXIN) 500 MG tablet Take 1 tablet (500 mg total) by mouth 2 (two) times daily. 03/08/16   Nicole Pisciotta, PA-C  Multiple Vitamins-Minerals (MULTIVITAMIN WITH MINERALS) tablet Take 1 tablet by mouth daily. Patient not taking: Reported on 03/08/2016 10/24/15   Gloriann Loan, PA-C  ondansetron (ZOFRAN) 4 MG tablet Take 1 tablet (4 mg total) by mouth every 6 (six) hours. Patient not taking: Reported on 03/08/2016 12/03/15   Virgel Manifold, MD  pantoprazole (PROTONIX) 40 MG tablet Take 1 tablet (40 mg total) by mouth daily. For reflux Patient not taking: Reported on 05/13/2015 04/16/15   Inda Castle, MD  QUEtiapine (SEROQUEL XR) 50 MG TB24 24 hr tablet Take 1 tablet (50 mg total) by mouth daily. Patient not taking: Reported on 10/24/2015 10/07/15   Dara Hoyer, PA-C   BP 133/80 mmHg  Pulse 114  Temp(Src)   Resp 16  Wt 157 lb (71.215 kg)  SpO2 100% Physical Exam  Constitutional: She appears well-developed and well-nourished. No distress.  Smells of alcohol  HENT:  Head: Normocephalic and atraumatic.  Mouth/Throat: Oropharynx is clear and moist.  No evidence of head trauma.    Eyes: Conjunctivae and EOM are normal. Pupils are equal, round, and reactive to light.  Neck: Normal range of motion. Neck supple.  Cardiovascular: Regular rhythm and intact distal pulses.  Tachycardia present.   No murmur heard. Pulmonary/Chest: Effort normal and breath sounds normal. No respiratory distress. She has no wheezes. She has no rales.  Abdominal: Soft. She exhibits distension. There is no tenderness. There is no rebound and no guarding.  Musculoskeletal: Normal range of motion. She exhibits no edema or tenderness.  Neurological:  Groggy but can answer some questions.  Slurred speech. Staggering with ataxia and unable to ambulate  unassisted.  Skin: Skin is warm and dry. No rash noted. No erythema.  Psychiatric:  intoxicated  Nursing note and vitals reviewed.   ED Course  Procedures (including critical care time) Labs Review Labs Reviewed  CBC WITH DIFFERENTIAL/PLATELET  COMPREHENSIVE METABOLIC PANEL  ETHANOL  ACETAMINOPHEN LEVEL  SALICYLATE LEVEL    Imaging Review No results found. I have personally reviewed and evaluated these images and lab results as part of my medical decision-making.   EKG Interpretation None      MDM   Final diagnoses:  None     Patient presenting today after her daughter dropped her off in the waiting room with alcohol intoxication. Unclear why patient is here but she is obviously intoxicated. She is unable to stand independently and is slurring her words and smells of alcohol. She has no obvious signs of trauma. Patient is initially requesting  detox however she is so intoxicated at this time she will need to sober up. She is not displaying any signs of stroke. There is no sign of head trauma. She has a long history of alcohol abuse and cirrhosis. Currently hemodynamically stable but is tachycardic. She denies any drug use and does not have a history of similar. She currently denies SI or HI.  Medical clearance labs pending. Because patient would not stay in the bed and was a danger to herself with concern for a fall she was given Geodon.    Blanchie Dessert, MD 03/19/16 1642

## 2016-04-02 ENCOUNTER — Ambulatory Visit (INDEPENDENT_AMBULATORY_CARE_PROVIDER_SITE_OTHER): Payer: No Typology Code available for payment source | Admitting: Internal Medicine

## 2016-04-02 VITALS — BP 98/62 | HR 79 | Temp 98.2°F | Resp 16 | Ht 63.5 in | Wt 156.2 lb

## 2016-04-02 DIAGNOSIS — F418 Other specified anxiety disorders: Secondary | ICD-10-CM

## 2016-04-02 DIAGNOSIS — F329 Major depressive disorder, single episode, unspecified: Secondary | ICD-10-CM

## 2016-04-02 DIAGNOSIS — F5102 Adjustment insomnia: Secondary | ICD-10-CM | POA: Diagnosis not present

## 2016-04-02 DIAGNOSIS — F419 Anxiety disorder, unspecified: Secondary | ICD-10-CM

## 2016-04-02 DIAGNOSIS — F32A Depression, unspecified: Secondary | ICD-10-CM

## 2016-04-02 MED ORDER — CLONAZEPAM 0.5 MG PO TABS: 0.5000 mg | ORAL_TABLET | Freq: Every day | ORAL | Status: DC
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
## 2016-04-02 NOTE — Patient Instructions (Addendum)
  Please keep the therapy appointment that you have next week. Please also Contact your primary care provider on Monday to schedule follow-up there.   IF you received an x-ray today, you will receive an invoice from [facility]. Please contact [facility] at [phone] with questions or concerns regarding your invoice.   IF you received labwork today, you will receive an invoice from [employer]. Please contact [facility] at [phone] with questions or concerns regarding your invoice.   Our billing staff will not be able to assist you with questions regarding bills from these companies.  You will be contacted with the lab results as soon as they are available. The fastest way to get your results is to activate your My Chart account. Instructions are located on the last page of this paperwork. If you have not heard from [place] regarding the results in 2 weeks, please contact this office.

## 2016-09-15 IMAGING — CT CT ABD-PELV W/ CM
2 of 4 series · 16 of 46 positions shown, 18 images · IV contrast (OMNIPAQUE 300)
Comparison: 02/16/2013

CLINICAL DATA: Difficulty urinating and low back pain

EXAM:
CT ABDOMEN AND PELVIS WITH CONTRAST
TECHNIQUE: Multidetector CT imaging of the abdomen and pelvis was performed
using the standard protocol following bolus administration of
intravenous contrast.
CONTRAST:  25mL OMNIPAQUE IOHEXOL 300 MG/ML SOLN, 100mL OMNIPAQUE
IOHEXOL 300 MG/ML SOLN

[Series 2: abd/pel with · axial · 0.74mm/px · z∈[-672,-292]mm · 13 of 84 slices shown, 15 images]
[im 4/84  soft-tissue]
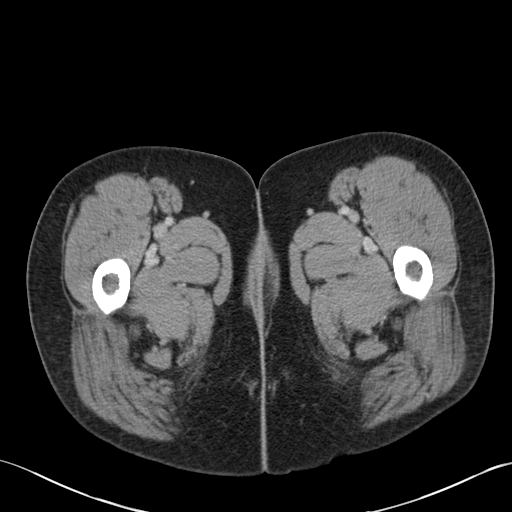
[im 4/84  bone]
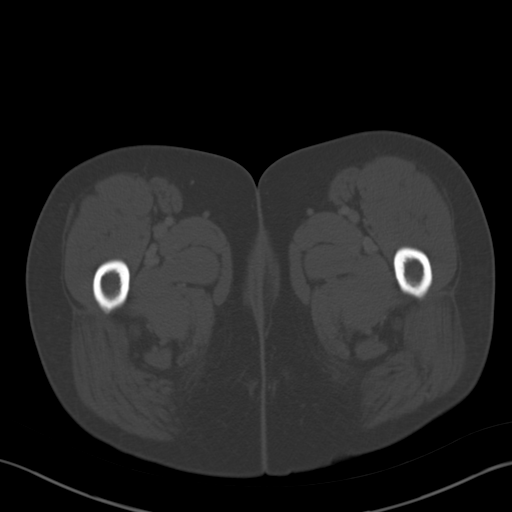
[im 10/84  soft-tissue]
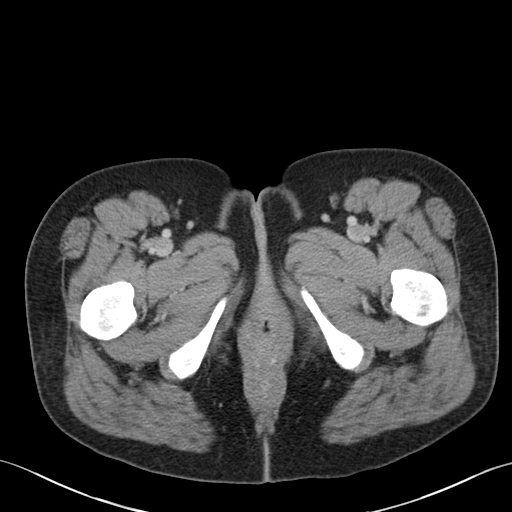
[im 16/84  soft-tissue]
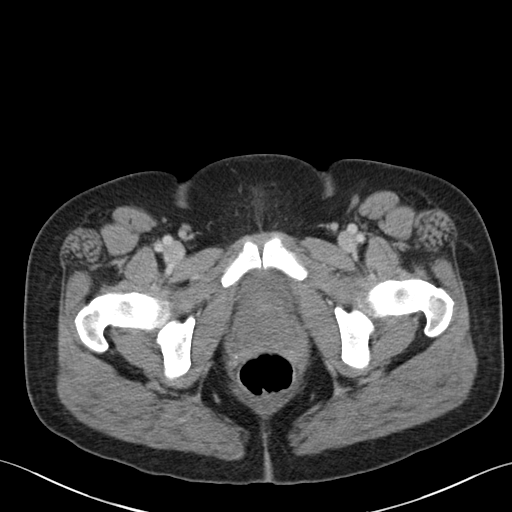
[im 23/84  soft-tissue]
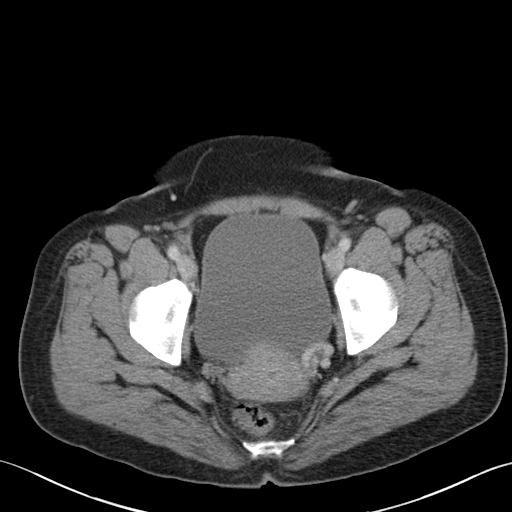
[im 29/84  soft-tissue]
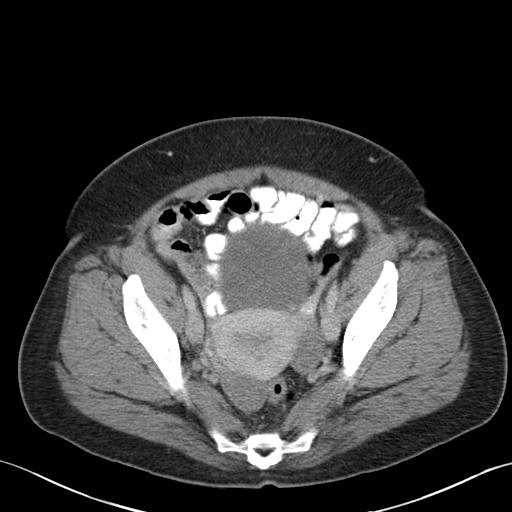
[im 36/84  soft-tissue]
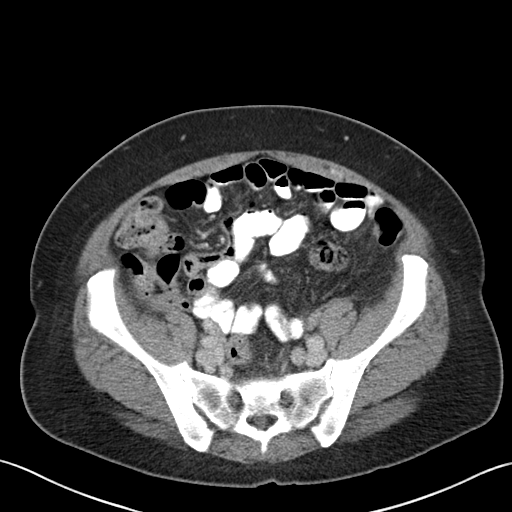
[im 42/84  soft-tissue]
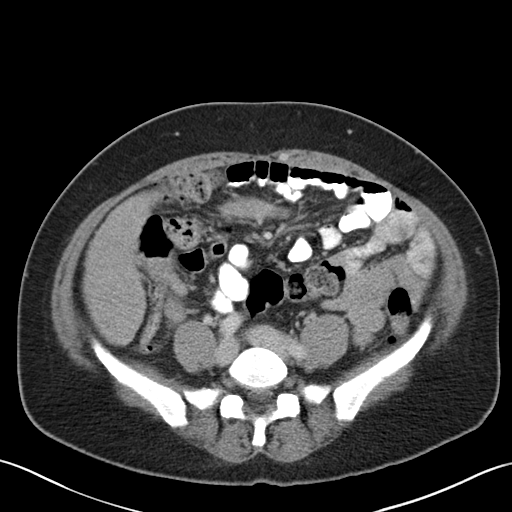
[im 48/84  soft-tissue]
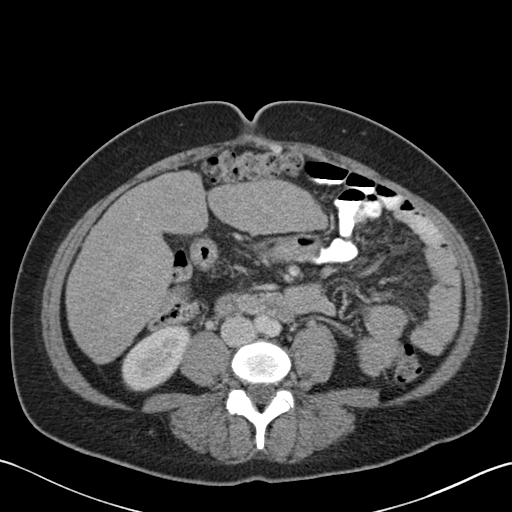
[im 55/84  soft-tissue]
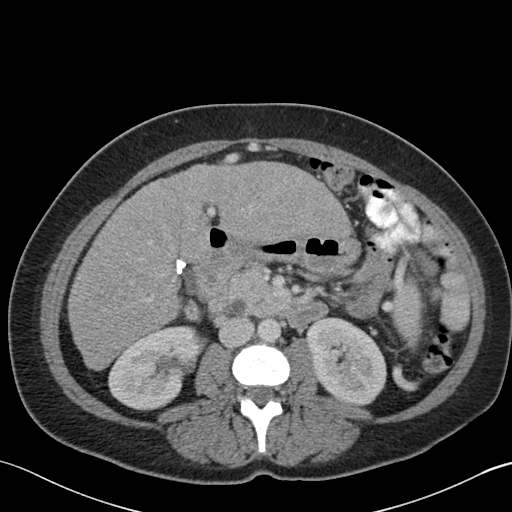
[im 55/84  bone]
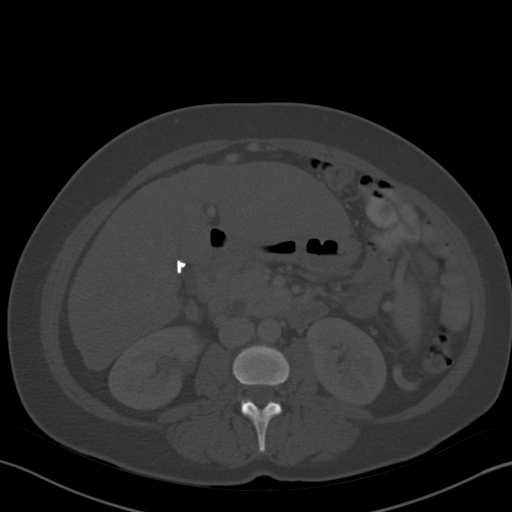
[im 61/84  soft-tissue]
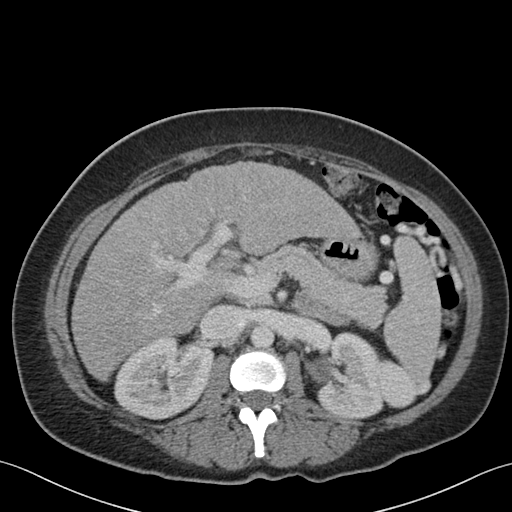
[im 68/84  soft-tissue]
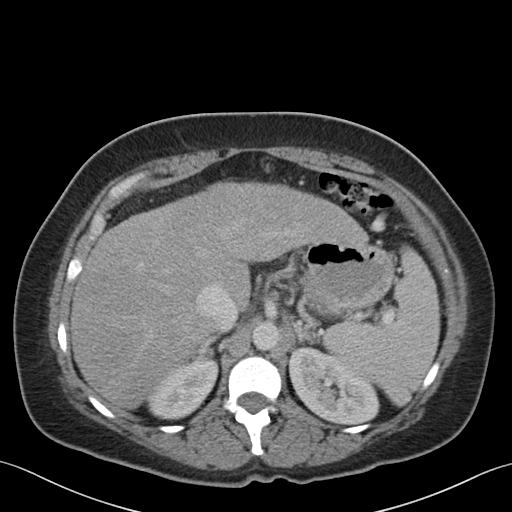
[im 74/84  soft-tissue]
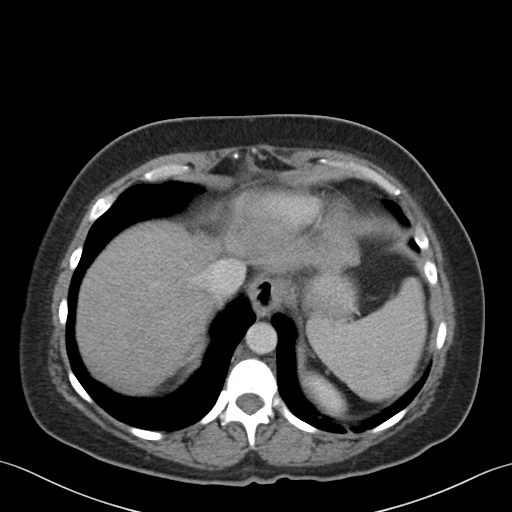
[im 80/84  soft-tissue]
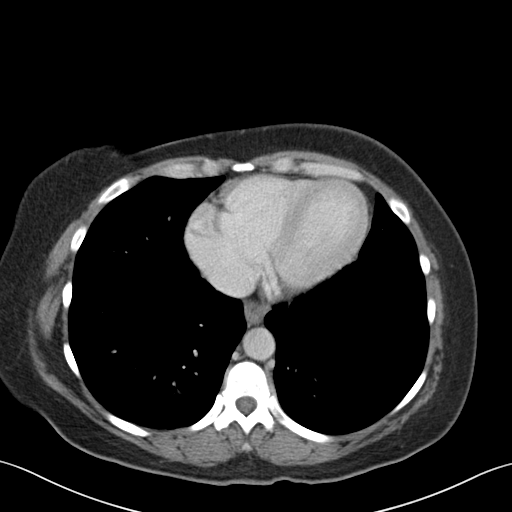

[Series 5: coronal a/|p · coronal · 0.73mm/px · 3 of 97 slices shown]
[im 33/97  soft-tissue]
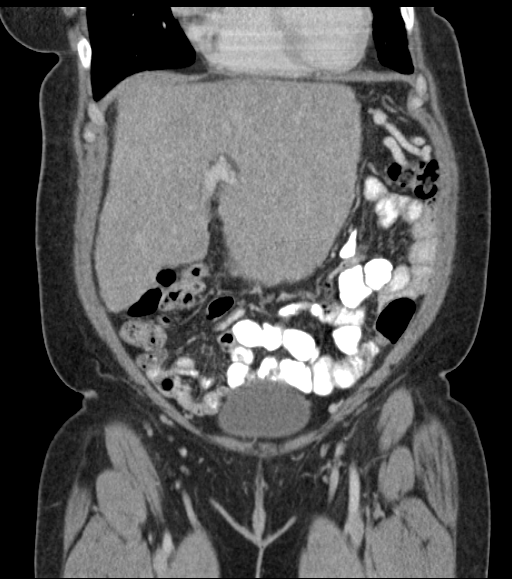
[im 43/97  soft-tissue]
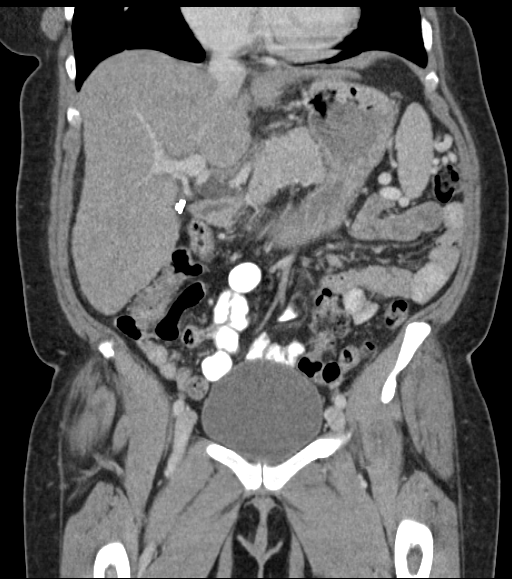
[im 54/97  soft-tissue]
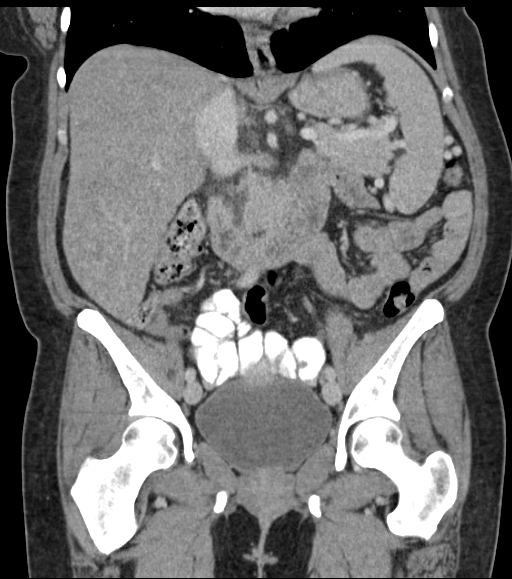

[16 of 46 positions shown; findings below may reference images not displayed]

FINDINGS: Lung bases are free of acute infiltrate or sizable effusion.

The liver demonstrates mild nodularity similar to that seen on the
prior exam. The multifocal small hypodense nodules are not well
appreciated on this exam. There again noted changes of portal
hypertension with recanalization of the paraumbilical vein and
multiple abdominal wall varices. Multiple perisplenic varices are
noted as well. Decompression into a large splenorenal shunt is
noted. This is stable from the prior exam. The gallbladder has been
surgically removed. Central biliary ductal dilatation is noted. The
spleen, adrenal glands and pancreas are otherwise within normal
limits.

The kidneys are well visualized bilaterally without renal calculi or
urinary tract obstructive changes.

The appendix is within normal limits. The bladder is well distended.
No pelvic mass lesion is seen. No free pelvic fluid is noted. Mild
cystic changes in the ovaries are noted bilaterally and stable from
the prior exam. No acute bony abnormality is noted.
IMPRESSION: Changes consistent with portal hypertension with multiple
portosystemic collaterals. Mild changes of hepatic cirrhosis are
noted. The degree of hepatic enlargement has decreased slightly in
the interval from the prior exam.

No new focal abnormality is seen.  No acute abnormality noted.

## 2017-01-13 DIAGNOSIS — I781 Nevus, non-neoplastic: Secondary | ICD-10-CM | POA: Insufficient documentation

## 2017-01-25 ENCOUNTER — Emergency Department (HOSPITAL_COMMUNITY): Payer: Self-pay

## 2017-01-25 ENCOUNTER — Encounter (HOSPITAL_COMMUNITY): Payer: Self-pay

## 2017-01-25 ENCOUNTER — Emergency Department (HOSPITAL_COMMUNITY)
Admission: EM | Admit: 2017-01-25 | Discharge: 2017-01-27 | Disposition: A | Discharge status: Home / Self Care | Payer: PRIVATE HEALTH INSURANCE

## 2017-01-25 DIAGNOSIS — F1020 Alcohol dependence, uncomplicated: Secondary | ICD-10-CM | POA: Diagnosis present

## 2017-01-25 DIAGNOSIS — F1024 Alcohol dependence with alcohol-induced mood disorder: Secondary | ICD-10-CM | POA: Diagnosis present

## 2017-01-25 DIAGNOSIS — W19XXXA Unspecified fall, initial encounter: Secondary | ICD-10-CM | POA: Insufficient documentation

## 2017-01-25 DIAGNOSIS — Y939 Activity, unspecified: Secondary | ICD-10-CM | POA: Insufficient documentation

## 2017-01-25 DIAGNOSIS — Y929 Unspecified place or not applicable: Secondary | ICD-10-CM | POA: Insufficient documentation

## 2017-01-25 DIAGNOSIS — M545 Low back pain: Secondary | ICD-10-CM | POA: Insufficient documentation

## 2017-01-25 DIAGNOSIS — F339 Major depressive disorder, recurrent, unspecified: Secondary | ICD-10-CM

## 2017-01-25 DIAGNOSIS — F10239 Alcohol dependence with withdrawal, unspecified: Secondary | ICD-10-CM

## 2017-01-25 DIAGNOSIS — Z87891 Personal history of nicotine dependence: Secondary | ICD-10-CM | POA: Insufficient documentation

## 2017-01-25 DIAGNOSIS — F1010 Alcohol abuse, uncomplicated: Secondary | ICD-10-CM

## 2017-01-25 DIAGNOSIS — R45851 Suicidal ideations: Secondary | ICD-10-CM | POA: Insufficient documentation

## 2017-01-25 DIAGNOSIS — Z79899 Other long term (current) drug therapy: Secondary | ICD-10-CM | POA: Insufficient documentation

## 2017-01-25 DIAGNOSIS — Y999 Unspecified external cause status: Secondary | ICD-10-CM | POA: Insufficient documentation

## 2017-01-25 LAB — CBC
HEMATOCRIT: 34.1 % — AB (ref 36.0–46.0)
Hemoglobin: 10.8 g/dL — ABNORMAL LOW (ref 12.0–15.0)
MCH: 27.6 pg (ref 26.0–34.0)
MCHC: 31.7 g/dL (ref 30.0–36.0)
MCV: 87 fL (ref 78.0–100.0)
PLATELETS: 46 10*3/uL — AB (ref 150–400)
RBC: 3.92 MIL/uL (ref 3.87–5.11)
RDW: 19.5 % — AB (ref 11.5–15.5)
WBC: 4.3 10*3/uL (ref 4.0–10.5)

## 2017-01-25 LAB — COMPREHENSIVE METABOLIC PANEL
ALBUMIN: 3.8 g/dL (ref 3.5–5.0)
ALT: 56 U/L — ABNORMAL HIGH (ref 14–54)
ANION GAP: 8 (ref 5–15)
AST: 175 U/L — ABNORMAL HIGH (ref 15–41)
Alkaline Phosphatase: 138 U/L — ABNORMAL HIGH (ref 38–126)
BILIRUBIN TOTAL: 1.6 mg/dL — AB (ref 0.3–1.2)
BUN: 10 mg/dL (ref 6–20)
CALCIUM: 8.9 mg/dL (ref 8.9–10.3)
CO2: 30 mmol/L (ref 22–32)
Chloride: 110 mmol/L (ref 101–111)
Creatinine, Ser: 0.51 mg/dL (ref 0.44–1.00)
GFR calc Af Amer: >60 mL/min (ref >60)
GLUCOSE: 105 mg/dL — AB (ref 65–99)
POTASSIUM: 3.8 mmol/L (ref 3.5–5.1)
Sodium: 148 mmol/L — ABNORMAL HIGH (ref 135–145)
TOTAL PROTEIN: 8.5 g/dL — AB (ref 6.5–8.1)

## 2017-01-25 LAB — RAPID URINE DRUG SCREEN, HOSP PERFORMED
Amphetamines: NOT DETECTED
BARBITURATES: NOT DETECTED
Benzodiazepines: POSITIVE — AB
Cocaine: NOT DETECTED
Opiates: NOT DETECTED
Tetrahydrocannabinol: NOT DETECTED

## 2017-01-25 LAB — ACETAMINOPHEN LEVEL

## 2017-01-25 LAB — PREGNANCY, URINE: PREG TEST UR: NEGATIVE

## 2017-01-25 LAB — SALICYLATE LEVEL: Salicylate Lvl: <7 mg/dL (ref 2.8–30.0)

## 2017-01-25 LAB — ETHANOL: ALCOHOL ETHYL (B): 398 mg/dL — AB (ref <5)

## 2017-01-25 MED ORDER — ACETAMINOPHEN 325 MG PO TABS
650.0000 mg | ORAL_TABLET | ORAL | Status: DC | PRN
  Administered 2017-01-25 – 2017-01-26 (×2): 650 mg via ORAL
  Filled 2017-01-25 (×2): qty 2

## 2017-01-25 MED ORDER — LORAZEPAM 1 MG PO TABS: 0.0000 mg | ORAL_TABLET | Freq: Two times a day (BID) | ORAL | Status: DC

## 2017-01-25 MED ORDER — LORAZEPAM 1 MG PO TABS
0.0000 mg | ORAL_TABLET | Freq: Four times a day (QID) | ORAL | Status: DC
  Administered 2017-01-25 – 2017-01-26 (×3): 1 mg via ORAL
  Administered 2017-01-26: 2 mg via ORAL
  Administered 2017-01-26 (×3): 1 mg via ORAL
  Filled 2017-01-25: qty 1
  Filled 2017-01-25: qty 2
  Filled 2017-01-25: qty 1
  Filled 2017-01-25: qty 2
  Filled 2017-01-25 (×2): qty 1
  Filled 2017-01-25: qty 2
  Filled 2017-01-25: qty 1

## 2017-01-25 NOTE — BH Assessment (Signed)
Assessment completed. Consulted Patriciaann Clan, [facility] who recommended inpatient treatment. TTS to seek placement.

## 2017-01-25 NOTE — ED Triage Notes (Addendum)
Pt with alcohol problem.  Drinks 3-4 bottles of wine a day.  When she stops drinking she goes into Detox tremors and has seizures.  Bib sister.  Wanting inpatient treatment.  At end of interview, pt is stating she does want to hurt herself and wants to die.
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
## 2017-01-26 DIAGNOSIS — F339 Major depressive disorder, recurrent, unspecified: Secondary | ICD-10-CM

## 2017-01-26 DIAGNOSIS — Z87891 Personal history of nicotine dependence: Secondary | ICD-10-CM | POA: Diagnosis not present

## 2017-01-26 DIAGNOSIS — R45851 Suicidal ideations: Secondary | ICD-10-CM | POA: Diagnosis not present

## 2017-01-26 DIAGNOSIS — Z888 Allergy status to other drugs, medicaments and biological substances status: Secondary | ICD-10-CM

## 2017-01-26 DIAGNOSIS — Z79899 Other long term (current) drug therapy: Secondary | ICD-10-CM | POA: Diagnosis not present

## 2017-01-26 DIAGNOSIS — Z811 Family history of alcohol abuse and dependence: Secondary | ICD-10-CM | POA: Diagnosis not present

## 2017-01-26 MED ORDER — ONDANSETRON 4 MG PO TBDP
4.0000 mg | ORAL_TABLET | Freq: Three times a day (TID) | ORAL | Status: DC | PRN
  Administered 2017-01-26 – 2017-01-27 (×2): 4 mg via ORAL
  Filled 2017-01-26 (×2): qty 1

## 2017-01-26 MED ORDER — QUETIAPINE FUMARATE 50 MG PO TABS: 50.0000 mg | ORAL_TABLET | Freq: Every evening | ORAL | Status: DC | PRN

## 2017-01-26 MED ORDER — TRAZODONE HCL 100 MG PO TABS
100.0000 mg | ORAL_TABLET | Freq: Every day | ORAL | Status: DC
  Administered 2017-01-26: 100 mg via ORAL
  Filled 2017-01-26: qty 1

## 2017-01-26 MED ORDER — ONDANSETRON 4 MG PO TBDP
4.0000 mg | ORAL_TABLET | Freq: Once | ORAL | Status: AC
  Administered 2017-01-26: 4 mg via ORAL
  Filled 2017-01-26: qty 1

## 2017-01-26 MED ORDER — SODIUM CHLORIDE 0.9 % IV BOLUS (SEPSIS)
2000.0000 mL | Freq: Once | INTRAVENOUS | Status: AC
  Administered 2017-01-26: 2000 mL via INTRAVENOUS
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
## 2017-01-26 NOTE — ED Notes (Signed)
Sitter at bedside.

## 2017-01-26 NOTE — ED Notes (Signed)
Pt with sitter and visitor at the bedside.  Pt states that she is not feeling well.  Pt is nauseated and has dry heaves, reports some headache.  Will medicate per CIWA

## 2017-01-26 NOTE — ED Notes (Signed)
Pt denies SI/HI.  Pt stated "drink 3-4 bottles of wine every day.  I've drank for 15 years.  I'm a paralegal."  Pt is oriented to date, time, place.

## 2017-01-26 NOTE — ED Notes (Signed)
Pt reports nausea 

## 2017-01-26 NOTE — ED Notes (Addendum)
Since I met pt at noon I noted that she has a nosebleed.  Light bleeding.  Pt has tissues.  EDP Dr. Roderic Palau notified of pt nosebleed and he came to bedside to see pt.  Per pt she has had this nosebleed intermittently her whole life.

## 2017-01-26 NOTE — BH Assessment (Signed)
Pt has been faxed to the following inpt facilities for review: [facility], [facility], [facility], [facility], [facility], [facility], [facility], [facility].  Lind Covert, MSW, Latanya Presser

## 2017-01-26 NOTE — ED Notes (Signed)
Bed: WA28 Expected date:  Expected time:  Means of arrival:  Comments: 

## 2017-01-26 NOTE — BH Assessment (Signed)
[place] Assessment Progress Note  Per Corena Pilgrim, MD, this pt will require psychiatric hospitalization once she is medically cleared.  This Probation officer spoke to EDP Milton Ferguson, MD, who reports that pt is, in fact, medically cleared at this time.  I reported this to Leonia Reader, RN, [facility], who has additional concerns about pt's medical stability, and further adds they [facility] does not have an appropriate bed to accommodate the pt at this time.  This was staffed with Waylan Boga, DNP, who recommends that pt remain in ED overnight for further observation, with a view toward completing disposition tomorrow, 01/27/2017.  She notes that pt is currently under voluntary status, and that she does not meet criteria for IVC.  Dr Roderic Palau agrees to keep pt in the ED overnight, as long as pt concurs with this plan.  Jalene Mullet, [employer] Triage Specialist [phone]

## 2017-01-26 NOTE — ED Notes (Signed)
SBAR Report received from previous nurse. Pt received calm and visible on unit. Pt denies current SI/ HI, A/V H, depression, anxiety, or pain at this time, and appears otherwise stable and free of distress. Pt reminded of camera surveillance, q 15 min rounds, and rules of the milieu. Will continue to assess. 

## 2017-01-27 NOTE — ED Notes (Signed)
Pt is requesting to be discharged and follow up outpatient. She denies si and hi. Pt reports that she needs to return to work and is not interested in IP treatment.

## 2017-01-27 NOTE — ED Notes (Signed)
Pt d/c from the hospital.  All items returned. D/C instructions given. Pt denies si and hi.  

## 2017-01-27 NOTE — BH Assessment (Signed)
Reassessment:   Writer met with patient face to face to complete a reassessment. She sts, "I feel much better than I did when I came here". She explains feeling depressed due to the recent death of her father. She admits that she tried to self medicate by drinking an extensive amount of alcohol. Sts, "I just had a bed day". She admits to suicidal thoughts but denies ever having a plan or intent. Patient has no history of suicide. No HI. No AVH's. She does admit that she has a drinking problem and has sought out help on several occasions. She received INPT treatment at a facility in January. She is currently participating in services with AA. Patient is calm and cooperative. Dressed in scrubs. Speech is normal. Affect is appropriate.

## 2017-01-27 NOTE — Discharge Instructions (Signed)
To help you maintain a sober lifestyle, a substance abuse treatment program may be beneficial to you.  Contact [facility] at your earliest opportunity to ask about enrolling in their program: ° °     [facility] ([facility] °     [address] °     [place], [place] [address] °     [phone] °     New patients are seen at the walk-in clinic every Tuesday from 9:00 am - 12:00 pm. °

## 2017-01-27 NOTE — ED Notes (Signed)
Pt is alert in her room and c/o nausea this morning not eating her breakfast. Gave prn zofran and a cup of Gingerale. Pt is pleasant and cooperative. She reports that her longest sobriety has been 4-5 months. She first started having a problem with alcohol at age 41. Pt denies si and hi. Safety maintained on the unit.

## 2017-02-10 ENCOUNTER — Encounter (HOSPITAL_COMMUNITY): Payer: Self-pay | Admitting: *Deleted

## 2017-02-10 ENCOUNTER — Emergency Department (HOSPITAL_COMMUNITY)
Admission: EM | Admit: 2017-02-10 | Discharge: 2017-02-10 | Disposition: A | Discharge status: Home / Self Care | Payer: No Typology Code available for payment source | Attending: Emergency Medicine | Admitting: Emergency Medicine

## 2017-02-10 DIAGNOSIS — F10220 Alcohol dependence with intoxication, uncomplicated: Secondary | ICD-10-CM | POA: Insufficient documentation

## 2017-02-10 DIAGNOSIS — Z5181 Encounter for therapeutic drug level monitoring: Secondary | ICD-10-CM | POA: Insufficient documentation

## 2017-02-10 DIAGNOSIS — F10920 Alcohol use, unspecified with intoxication, uncomplicated: Secondary | ICD-10-CM

## 2017-02-10 DIAGNOSIS — Z87891 Personal history of nicotine dependence: Secondary | ICD-10-CM | POA: Insufficient documentation

## 2017-02-10 LAB — I-STAT BETA HCG BLOOD, ED (MC, WL, AP ONLY)

## 2017-02-10 LAB — COMPREHENSIVE METABOLIC PANEL
ALBUMIN: 3.6 g/dL (ref 3.5–5.0)
ALT: 77 U/L — AB (ref 14–54)
AST: 270 U/L — AB (ref 15–41)
Alkaline Phosphatase: 135 U/L — ABNORMAL HIGH (ref 38–126)
Anion gap: 10 (ref 5–15)
BUN: 7 mg/dL (ref 6–20)
CHLORIDE: 105 mmol/L (ref 101–111)
CO2: 24 mmol/L (ref 22–32)
CREATININE: 0.44 mg/dL (ref 0.44–1.00)
Calcium: 8.3 mg/dL — ABNORMAL LOW (ref 8.9–10.3)
GFR calc Af Amer: >60 mL/min (ref >60)
GLUCOSE: 112 mg/dL — AB (ref 65–99)
Potassium: 3.7 mmol/L (ref 3.5–5.1)
SODIUM: 139 mmol/L (ref 135–145)
Total Bilirubin: 2.1 mg/dL — ABNORMAL HIGH (ref 0.3–1.2)
Total Protein: 8.1 g/dL (ref 6.5–8.1)

## 2017-02-10 LAB — CBC
HEMATOCRIT: 31.7 % — AB (ref 36.0–46.0)
Hemoglobin: 10.1 g/dL — ABNORMAL LOW (ref 12.0–15.0)
MCH: 27.7 pg (ref 26.0–34.0)
MCHC: 31.9 g/dL (ref 30.0–36.0)
MCV: 87.1 fL (ref 78.0–100.0)
Platelets: 35 10*3/uL — ABNORMAL LOW (ref 150–400)
RBC: 3.64 MIL/uL — ABNORMAL LOW (ref 3.87–5.11)
RDW: 19.9 % — ABNORMAL HIGH (ref 11.5–15.5)
WBC: 9.2 10*3/uL (ref 4.0–10.5)

## 2017-02-10 LAB — DIFFERENTIAL
Basophils Absolute: 0 10*3/uL (ref 0.0–0.1)
Basophils Relative: 0 %
EOS ABS: 0 10*3/uL (ref 0.0–0.7)
EOS PCT: 0 %
LYMPHS ABS: 2.2 10*3/uL (ref 0.7–4.0)
LYMPHS PCT: 24 %
MONOS PCT: 4 %
Monocytes Absolute: 0.4 10*3/uL (ref 0.1–1.0)
Neutro Abs: 6.6 10*3/uL (ref 1.7–7.7)
Neutrophils Relative %: 71 %

## 2017-02-10 LAB — RAPID URINE DRUG SCREEN, HOSP PERFORMED
Amphetamines: NOT DETECTED
BARBITURATES: NOT DETECTED
Benzodiazepines: NOT DETECTED
Cocaine: NOT DETECTED
Opiates: NOT DETECTED
TETRAHYDROCANNABINOL: NOT DETECTED

## 2017-02-10 LAB — ETHANOL: Alcohol, Ethyl (B): 474 mg/dL (ref <5)

## 2017-02-10 LAB — PROTIME-INR
INR: 1.23
PROTHROMBIN TIME: 15.6 s — AB (ref 11.4–15.2)

## 2017-02-10 LAB — POC OCCULT BLOOD, ED: Fecal Occult Bld: NEGATIVE

## 2017-02-10 MED ORDER — SODIUM CHLORIDE 0.9 % IV BOLUS (SEPSIS)
1000.0000 mL | Freq: Once | INTRAVENOUS | Status: AC
  Administered 2017-02-10: 1000 mL via INTRAVENOUS

## 2017-02-10 MED ORDER — LORAZEPAM 2 MG/ML IJ SOLN: 0.0000 mg | Freq: Two times a day (BID) | INTRAMUSCULAR | Status: DC

## 2017-02-10 MED ORDER — FOLIC ACID 1 MG PO TABS: 1.0000 mg | ORAL_TABLET | Freq: Every day | ORAL | Status: DC

## 2017-02-10 MED ORDER — METOCLOPRAMIDE HCL 5 MG/ML IJ SOLN
10.0000 mg | Freq: Once | INTRAMUSCULAR | Status: AC
  Administered 2017-02-10: 10 mg via INTRAVENOUS
  Filled 2017-02-10: qty 2

## 2017-02-10 MED ORDER — CHLORDIAZEPOXIDE HCL 25 MG PO CAPS: ORAL_CAPSULE | ORAL | 0 refills | Status: DC

## 2017-02-10 MED ORDER — THIAMINE HCL 100 MG/ML IJ SOLN: 100.0000 mg | Freq: Every day | INTRAMUSCULAR | Status: DC

## 2017-02-10 MED ORDER — LORAZEPAM 2 MG/ML IJ SOLN: 0.0000 mg | Freq: Four times a day (QID) | INTRAMUSCULAR | Status: DC

## 2017-02-10 MED ORDER — ADULT MULTIVITAMIN W/MINERALS CH: 1.0000 | ORAL_TABLET | Freq: Every day | ORAL | Status: DC

## 2017-02-10 MED ORDER — VITAMIN B-1 100 MG PO TABS: 100.0000 mg | ORAL_TABLET | Freq: Every day | ORAL | Status: DC

## 2017-02-10 NOTE — Discharge Instructions (Signed)
Some detox resources are provided. You can take librium as you are coming down off of alcohol.   Return for worsening symptoms, including seizures, intractable vomiting, concerns about withdrawal or any other symptoms concerning to you.

## 2017-02-10 NOTE — ED Triage Notes (Signed)
Pt states she is going through alcohol withdrawal and would like detox. Pt states she drinks 3-4 bottles of wine a day. Pt last drank alcohol yesterday. Pt states she has been vomiting since yesterday.   Pt states she noticed bleeding from her nose and mouth yesterday. Pt has hx of esophogeal varices.
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
## 2017-04-18 ENCOUNTER — Encounter (HOSPITAL_COMMUNITY): Payer: Self-pay

## 2017-04-18 ENCOUNTER — Emergency Department (HOSPITAL_COMMUNITY)
Admission: EM | Admit: 2017-04-18 | Discharge: 2017-04-18 | Disposition: A | Discharge status: Home / Self Care | Payer: Self-pay | Attending: Emergency Medicine | Admitting: Emergency Medicine

## 2017-04-18 DIAGNOSIS — I10 Essential (primary) hypertension: Secondary | ICD-10-CM | POA: Insufficient documentation

## 2017-04-18 DIAGNOSIS — K047 Periapical abscess without sinus: Secondary | ICD-10-CM

## 2017-04-18 DIAGNOSIS — Z87891 Personal history of nicotine dependence: Secondary | ICD-10-CM | POA: Insufficient documentation

## 2017-04-18 DIAGNOSIS — K0889 Other specified disorders of teeth and supporting structures: Secondary | ICD-10-CM | POA: Insufficient documentation

## 2017-04-18 MED ORDER — NAPROXEN 500 MG PO TABS: 500.0000 mg | ORAL_TABLET | Freq: Two times a day (BID) | ORAL | 0 refills | Status: DC

## 2017-04-18 MED ORDER — PENICILLIN V POTASSIUM 500 MG PO TABS: 500.0000 mg | ORAL_TABLET | Freq: Four times a day (QID) | ORAL | 0 refills | Status: AC

## 2017-04-18 MED ORDER — HYDROCODONE-ACETAMINOPHEN 5-325 MG PO TABS
1.0000 | ORAL_TABLET | Freq: Once | ORAL | Status: AC
  Administered 2017-04-18: 1 via ORAL
  Filled 2017-04-18: qty 1

## 2017-04-18 MED ORDER — NAPROXEN 500 MG PO TABS
500.0000 mg | ORAL_TABLET | Freq: Once | ORAL | Status: AC
  Administered 2017-04-18: 500 mg via ORAL
  Filled 2017-04-18: qty 1

## 2017-04-18 NOTE — ED Triage Notes (Signed)
Pt has had swelling in throat, pain on left lower side of face for the past hour. Pt had a root canal done on the side in February. Pt is feeling like able to breathe but not getting full breaths.

## 2017-04-18 NOTE — ED Notes (Signed)
Patient was eating soups and was not really eating food. Patient had an onset of pain and having difficulty swelling. Patient has some swelling on the inside of left jaw.

## 2017-04-18 NOTE — Discharge Instructions (Signed)
You was seen today for dental pain. ED to follow-up with a dentist. You will be given antibiotics. If you develop increased swelling, pain, difficulty swallowing or any new or worsening symptoms she needs to be reevaluated.
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
## 2017-05-01 ENCOUNTER — Emergency Department (HOSPITAL_COMMUNITY)
Admission: EM | Admit: 2017-05-01 | Discharge: 2017-05-02 | Disposition: A | Discharge status: Other Acute Inpatient Hospital | Payer: Self-pay | Attending: Emergency Medicine | Admitting: Emergency Medicine

## 2017-05-01 ENCOUNTER — Encounter (HOSPITAL_COMMUNITY): Payer: Self-pay | Admitting: Emergency Medicine

## 2017-05-01 DIAGNOSIS — Z87891 Personal history of nicotine dependence: Secondary | ICD-10-CM | POA: Insufficient documentation

## 2017-05-01 DIAGNOSIS — F1024 Alcohol dependence with alcohol-induced mood disorder: Secondary | ICD-10-CM | POA: Insufficient documentation

## 2017-05-01 DIAGNOSIS — R45851 Suicidal ideations: Secondary | ICD-10-CM

## 2017-05-01 DIAGNOSIS — F10239 Alcohol dependence with withdrawal, unspecified: Secondary | ICD-10-CM

## 2017-05-01 DIAGNOSIS — F339 Major depressive disorder, recurrent, unspecified: Secondary | ICD-10-CM | POA: Diagnosis present

## 2017-05-01 DIAGNOSIS — F332 Major depressive disorder, recurrent severe without psychotic features: Secondary | ICD-10-CM | POA: Insufficient documentation

## 2017-05-01 DIAGNOSIS — Z79899 Other long term (current) drug therapy: Secondary | ICD-10-CM | POA: Insufficient documentation

## 2017-05-01 DIAGNOSIS — F1020 Alcohol dependence, uncomplicated: Secondary | ICD-10-CM | POA: Diagnosis present

## 2017-05-01 LAB — RAPID URINE DRUG SCREEN, HOSP PERFORMED
Amphetamines: NOT DETECTED
BARBITURATES: NOT DETECTED
BENZODIAZEPINES: NOT DETECTED
COCAINE: NOT DETECTED
Opiates: NOT DETECTED
TETRAHYDROCANNABINOL: NOT DETECTED

## 2017-05-01 LAB — COMPREHENSIVE METABOLIC PANEL
ALBUMIN: 3.6 g/dL (ref 3.5–5.0)
ALK PHOS: 101 U/L (ref 38–126)
ALT: 32 U/L (ref 14–54)
AST: 119 U/L — ABNORMAL HIGH (ref 15–41)
Anion gap: 6 (ref 5–15)
BUN: <5 mg/dL — ABNORMAL LOW (ref 6–20)
CALCIUM: 8.5 mg/dL — AB (ref 8.9–10.3)
CO2: 26 mmol/L (ref 22–32)
Chloride: 114 mmol/L — ABNORMAL HIGH (ref 101–111)
Creatinine, Ser: 0.36 mg/dL — ABNORMAL LOW (ref 0.44–1.00)
GFR calc Af Amer: >60 mL/min (ref >60)
GFR calc non Af Amer: >60 mL/min (ref >60)
GLUCOSE: 101 mg/dL — AB (ref 65–99)
POTASSIUM: 3.8 mmol/L (ref 3.5–5.1)
SODIUM: 146 mmol/L — AB (ref 135–145)
Total Bilirubin: 1.4 mg/dL — ABNORMAL HIGH (ref 0.3–1.2)
Total Protein: 8 g/dL (ref 6.5–8.1)

## 2017-05-01 LAB — CBC WITH DIFFERENTIAL/PLATELET
BASOS ABS: 0 10*3/uL (ref 0.0–0.1)
BASOS PCT: 1 %
EOS ABS: 0 10*3/uL (ref 0.0–0.7)
EOS PCT: 0 %
HCT: 33.7 % — ABNORMAL LOW (ref 36.0–46.0)
Hemoglobin: 10.6 g/dL — ABNORMAL LOW (ref 12.0–15.0)
Lymphocytes Relative: 57 %
Lymphs Abs: 2.4 10*3/uL (ref 0.7–4.0)
MCH: 28.2 pg (ref 26.0–34.0)
MCHC: 31.5 g/dL (ref 30.0–36.0)
MCV: 89.6 fL (ref 78.0–100.0)
MONO ABS: 0.1 10*3/uL (ref 0.1–1.0)
Monocytes Relative: 2 %
Neutro Abs: 1.7 10*3/uL (ref 1.7–7.7)
Neutrophils Relative %: 41 %
PLATELETS: 54 10*3/uL — AB (ref 150–400)
RBC: 3.76 MIL/uL — ABNORMAL LOW (ref 3.87–5.11)
RDW: 19.4 % — ABNORMAL HIGH (ref 11.5–15.5)
WBC: 4.2 10*3/uL (ref 4.0–10.5)

## 2017-05-01 LAB — ETHANOL: Alcohol, Ethyl (B): 498 mg/dL (ref <5)

## 2017-05-01 LAB — I-STAT BETA HCG BLOOD, ED (MC, WL, AP ONLY): I-stat hCG, quantitative: <5 m[IU]/mL (ref <5)

## 2017-05-01 MED ORDER — LORAZEPAM 1 MG PO TABS: 0.0000 mg | ORAL_TABLET | Freq: Two times a day (BID) | ORAL | Status: DC

## 2017-05-01 MED ORDER — LORAZEPAM 1 MG PO TABS
0.0000 mg | ORAL_TABLET | Freq: Four times a day (QID) | ORAL | Status: DC
  Administered 2017-05-02: 1 mg via ORAL
  Filled 2017-05-01: qty 1

## 2017-05-01 MED ORDER — LORAZEPAM 2 MG/ML IJ SOLN: 0.0000 mg | Freq: Four times a day (QID) | INTRAMUSCULAR | Status: DC

## 2017-05-01 MED ORDER — ONDANSETRON 4 MG PO TBDP
ORAL_TABLET | ORAL | Status: AC
  Filled 2017-05-01: qty 1

## 2017-05-01 MED ORDER — LORAZEPAM 2 MG/ML IJ SOLN: 0.0000 mg | Freq: Two times a day (BID) | INTRAMUSCULAR | Status: DC

## 2017-05-01 MED ORDER — VITAMIN B-1 100 MG PO TABS
100.0000 mg | ORAL_TABLET | Freq: Every day | ORAL | Status: DC
  Administered 2017-05-01 – 2017-05-02 (×2): 100 mg via ORAL
  Filled 2017-05-01 (×2): qty 1

## 2017-05-01 MED ORDER — THIAMINE HCL 100 MG/ML IJ SOLN: 100.0000 mg | Freq: Every day | INTRAMUSCULAR | Status: DC

## 2017-05-01 NOTE — ED Notes (Signed)
TTS at bedside. 

## 2017-05-01 NOTE — ED Notes (Signed)
Pt ambulatory to restroom.  Sitter at bedside

## 2017-05-01 NOTE — ED Triage Notes (Signed)
Per pt, states she has been drinking today-states her father passed away yesterday-states having suicidal thoughts, no plan-patient is intoxicated-patient has a history of esophageal varices
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
## 2017-05-01 NOTE — Progress Notes (Signed)
Per Patriciaann Clan PA meets inpatient criteria All[facility] Napoleon. Nash Shearer, LPC-A, [facility]  Counselor 05/01/2017 10:44 PM

## 2017-05-01 NOTE — ED Notes (Signed)
Date and time results received: 05/01/17 8:23 PM  (use smartphrase ".now" to insert current time)  Test: ETOH Critical Value: 498  Name of Provider Notified: DELO  Orders Received? Or Actions Taken?:

## 2017-05-01 NOTE — ED Notes (Signed)
ED Provider at bedside. 

## 2017-05-02 ENCOUNTER — Inpatient Hospital Stay (HOSPITAL_COMMUNITY)
Admission: AD | Admit: 2017-05-02 | Discharge: 2017-05-08 | DRG: 885 | Disposition: A | Discharge status: Home / Self Care | Payer: No Typology Code available for payment source | Source: Intra-hospital | Attending: Psychiatry | Admitting: Psychiatry

## 2017-05-02 ENCOUNTER — Encounter (HOSPITAL_COMMUNITY): Payer: Self-pay | Admitting: Physician Assistant

## 2017-05-02 ENCOUNTER — Encounter (HOSPITAL_COMMUNITY): Payer: Self-pay

## 2017-05-02 DIAGNOSIS — F332 Major depressive disorder, recurrent severe without psychotic features: Principal | ICD-10-CM | POA: Diagnosis present

## 2017-05-02 DIAGNOSIS — Z886 Allergy status to analgesic agent status: Secondary | ICD-10-CM

## 2017-05-02 DIAGNOSIS — Z62810 Personal history of physical and sexual abuse in childhood: Secondary | ICD-10-CM | POA: Diagnosis present

## 2017-05-02 DIAGNOSIS — R45851 Suicidal ideations: Secondary | ICD-10-CM

## 2017-05-02 DIAGNOSIS — Z885 Allergy status to narcotic agent status: Secondary | ICD-10-CM | POA: Diagnosis not present

## 2017-05-02 DIAGNOSIS — Z813 Family history of other psychoactive substance abuse and dependence: Secondary | ICD-10-CM

## 2017-05-02 DIAGNOSIS — F4310 Post-traumatic stress disorder, unspecified: Secondary | ICD-10-CM | POA: Diagnosis present

## 2017-05-02 DIAGNOSIS — Z811 Family history of alcohol abuse and dependence: Secondary | ICD-10-CM

## 2017-05-02 DIAGNOSIS — Z87891 Personal history of nicotine dependence: Secondary | ICD-10-CM | POA: Diagnosis not present

## 2017-05-02 DIAGNOSIS — F1990 Other psychoactive substance use, unspecified, uncomplicated: Secondary | ICD-10-CM | POA: Diagnosis not present

## 2017-05-02 DIAGNOSIS — F39 Unspecified mood [affective] disorder: Secondary | ICD-10-CM | POA: Diagnosis not present

## 2017-05-02 DIAGNOSIS — Z79899 Other long term (current) drug therapy: Secondary | ICD-10-CM

## 2017-05-02 DIAGNOSIS — Y908 Blood alcohol level of 240 mg/100 ml or more: Secondary | ICD-10-CM | POA: Diagnosis present

## 2017-05-02 DIAGNOSIS — F411 Generalized anxiety disorder: Secondary | ICD-10-CM | POA: Diagnosis present

## 2017-05-02 DIAGNOSIS — D649 Anemia, unspecified: Secondary | ICD-10-CM | POA: Diagnosis present

## 2017-05-02 DIAGNOSIS — F1094 Alcohol use, unspecified with alcohol-induced mood disorder: Secondary | ICD-10-CM | POA: Diagnosis not present

## 2017-05-02 DIAGNOSIS — G4700 Insomnia, unspecified: Secondary | ICD-10-CM | POA: Diagnosis not present

## 2017-05-02 DIAGNOSIS — K766 Portal hypertension: Secondary | ICD-10-CM | POA: Diagnosis present

## 2017-05-02 DIAGNOSIS — F10239 Alcohol dependence with withdrawal, unspecified: Secondary | ICD-10-CM | POA: Diagnosis present

## 2017-05-02 DIAGNOSIS — Y909 Presence of alcohol in blood, level not specified: Secondary | ICD-10-CM

## 2017-05-02 DIAGNOSIS — F1099 Alcohol use, unspecified with unspecified alcohol-induced disorder: Secondary | ICD-10-CM

## 2017-05-02 DIAGNOSIS — F1024 Alcohol dependence with alcohol-induced mood disorder: Secondary | ICD-10-CM | POA: Diagnosis present

## 2017-05-02 DIAGNOSIS — Z889 Allergy status to unspecified drugs, medicaments and biological substances status: Secondary | ICD-10-CM

## 2017-05-02 DIAGNOSIS — R197 Diarrhea, unspecified: Secondary | ICD-10-CM | POA: Diagnosis not present

## 2017-05-02 DIAGNOSIS — R110 Nausea: Secondary | ICD-10-CM | POA: Diagnosis not present

## 2017-05-02 LAB — ETHANOL
Alcohol, Ethyl (B): 216 mg/dL — ABNORMAL HIGH (ref <5)
Alcohol, Ethyl (B): 138 mg/dL — ABNORMAL HIGH (ref <5)

## 2017-05-02 MED ORDER — LORAZEPAM 1 MG PO TABS
0.0000 mg | ORAL_TABLET | Freq: Four times a day (QID) | ORAL | Status: DC
  Administered 2017-05-02: 1 mg via ORAL

## 2017-05-02 MED ORDER — THIAMINE HCL 100 MG/ML IJ SOLN
100.0000 mg | Freq: Every day | INTRAMUSCULAR | Status: DC
  Administered 2017-05-05: 100 mg via INTRAVENOUS
  Filled 2017-05-02: qty 2

## 2017-05-02 MED ORDER — THIAMINE HCL 100 MG/ML IJ SOLN: 100.0000 mg | Freq: Once | INTRAMUSCULAR | Status: DC

## 2017-05-02 MED ORDER — LOPERAMIDE HCL 2 MG PO CAPS: 2.0000 mg | ORAL_CAPSULE | ORAL | Status: AC | PRN

## 2017-05-02 MED ORDER — TOPIRAMATE 100 MG PO TABS
100.0000 mg | ORAL_TABLET | Freq: Two times a day (BID) | ORAL | Status: DC
  Administered 2017-05-02: 100 mg via ORAL
  Filled 2017-05-02: qty 1

## 2017-05-02 MED ORDER — VITAMIN B-1 100 MG PO TABS: 100.0000 mg | ORAL_TABLET | Freq: Every day | ORAL | Status: DC

## 2017-05-02 MED ORDER — CITALOPRAM HYDROBROMIDE 10 MG PO TABS
20.0000 mg | ORAL_TABLET | Freq: Every day | ORAL | Status: DC
  Administered 2017-05-02: 20 mg via ORAL
  Filled 2017-05-02: qty 2

## 2017-05-02 MED ORDER — ADULT MULTIVITAMIN W/MINERALS CH
1.0000 | ORAL_TABLET | Freq: Every day | ORAL | Status: DC
  Administered 2017-05-02 – 2017-05-08 (×7): 1 via ORAL
  Filled 2017-05-02 (×10): qty 1

## 2017-05-02 MED ORDER — MAGNESIUM HYDROXIDE 400 MG/5ML PO SUSP: 30.0000 mL | Freq: Every day | ORAL | Status: DC | PRN

## 2017-05-02 MED ORDER — CITALOPRAM HYDROBROMIDE 20 MG PO TABS
20.0000 mg | ORAL_TABLET | Freq: Every day | ORAL | Status: DC
  Administered 2017-05-03 – 2017-05-04 (×2): 20 mg via ORAL
  Filled 2017-05-02 (×4): qty 1

## 2017-05-02 MED ORDER — LORAZEPAM 1 MG PO TABS
1.0000 mg | ORAL_TABLET | Freq: Three times a day (TID) | ORAL | Status: AC
  Administered 2017-05-03 – 2017-05-04 (×3): 1 mg via ORAL
  Filled 2017-05-02 (×3): qty 1

## 2017-05-02 MED ORDER — LORAZEPAM 1 MG PO TABS
1.0000 mg | ORAL_TABLET | Freq: Four times a day (QID) | ORAL | Status: DC | PRN
  Filled 2017-05-02: qty 1

## 2017-05-02 MED ORDER — TRAZODONE HCL 100 MG PO TABS
100.0000 mg | ORAL_TABLET | Freq: Every day | ORAL | Status: DC
  Administered 2017-05-02 – 2017-05-03 (×2): 100 mg via ORAL
  Filled 2017-05-02 (×5): qty 1

## 2017-05-02 MED ORDER — LORAZEPAM 1 MG PO TABS
1.0000 mg | ORAL_TABLET | Freq: Four times a day (QID) | ORAL | Status: AC | PRN
  Administered 2017-05-02: 1 mg via ORAL
  Filled 2017-05-02: qty 1

## 2017-05-02 MED ORDER — ADULT MULTIVITAMIN W/MINERALS CH
1.0000 | ORAL_TABLET | Freq: Every day | ORAL | Status: DC
  Filled 2017-05-02: qty 1

## 2017-05-02 MED ORDER — THIAMINE HCL 100 MG/ML IJ SOLN: 100.0000 mg | Freq: Every day | INTRAMUSCULAR | Status: DC

## 2017-05-02 MED ORDER — VITAMIN B-1 100 MG PO TABS
100.0000 mg | ORAL_TABLET | Freq: Every day | ORAL | Status: DC
Start: 2017-05-03 — End: 2017-05-08
  Administered 2017-05-03 – 2017-05-08 (×6): 100 mg via ORAL
  Filled 2017-05-02 (×8): qty 1

## 2017-05-02 MED ORDER — FOLIC ACID 1 MG PO TABS
1.0000 mg | ORAL_TABLET | Freq: Every day | ORAL | Status: DC
  Administered 2017-05-03 – 2017-05-08 (×6): 1 mg via ORAL
  Filled 2017-05-02 (×8): qty 1

## 2017-05-02 MED ORDER — LORAZEPAM 1 MG PO TABS
1.0000 mg | ORAL_TABLET | Freq: Two times a day (BID) | ORAL | Status: AC
  Administered 2017-05-04 – 2017-05-05 (×2): 1 mg via ORAL
  Filled 2017-05-02 (×2): qty 1

## 2017-05-02 MED ORDER — LORAZEPAM 2 MG/ML IJ SOLN: 1.0000 mg | Freq: Four times a day (QID) | INTRAMUSCULAR | Status: DC | PRN

## 2017-05-02 MED ORDER — LORAZEPAM 1 MG PO TABS
1.0000 mg | ORAL_TABLET | Freq: Four times a day (QID) | ORAL | Status: AC
  Administered 2017-05-02 – 2017-05-03 (×4): 1 mg via ORAL
  Filled 2017-05-02 (×4): qty 1

## 2017-05-02 MED ORDER — LORAZEPAM 1 MG PO TABS: 0.0000 mg | ORAL_TABLET | Freq: Two times a day (BID) | ORAL | Status: DC

## 2017-05-02 MED ORDER — ALUM & MAG HYDROXIDE-SIMETH 200-200-20 MG/5ML PO SUSP
30.0000 mL | ORAL | Status: DC | PRN
  Administered 2017-05-07: 30 mL via ORAL
  Filled 2017-05-02: qty 30

## 2017-05-02 MED ORDER — ADULT MULTIVITAMIN W/MINERALS CH
1.0000 | ORAL_TABLET | Freq: Every day | ORAL | Status: DC
  Administered 2017-05-02: 1 via ORAL
  Filled 2017-05-02: qty 1

## 2017-05-02 MED ORDER — VITAMIN B-1 100 MG PO TABS
100.0000 mg | ORAL_TABLET | Freq: Every day | ORAL | Status: DC
Start: 2017-05-02 — End: 2017-05-02
  Filled 2017-05-02 (×2): qty 1

## 2017-05-02 MED ORDER — HYDROXYZINE HCL 25 MG PO TABS: 25.0000 mg | ORAL_TABLET | Freq: Four times a day (QID) | ORAL | Status: AC | PRN

## 2017-05-02 MED ORDER — ONDANSETRON 4 MG PO TBDP
4.0000 mg | ORAL_TABLET | Freq: Four times a day (QID) | ORAL | Status: AC | PRN
  Administered 2017-05-02 – 2017-05-03 (×2): 4 mg via ORAL
  Filled 2017-05-02 (×2): qty 1

## 2017-05-02 MED ORDER — FOLIC ACID 1 MG PO TABS
1.0000 mg | ORAL_TABLET | Freq: Every day | ORAL | Status: DC
  Administered 2017-05-02: 1 mg via ORAL
  Filled 2017-05-02: qty 1

## 2017-05-02 MED ORDER — LORAZEPAM 1 MG PO TABS
1.0000 mg | ORAL_TABLET | Freq: Every day | ORAL | Status: AC
  Administered 2017-05-06: 1 mg via ORAL
  Filled 2017-05-02: qty 1

## 2017-05-02 MED ORDER — LORAZEPAM 1 MG PO TABS: 1.0000 mg | ORAL_TABLET | Freq: Four times a day (QID) | ORAL | Status: DC | PRN

## 2017-05-02 MED ORDER — ACETAMINOPHEN 325 MG PO TABS: 650.0000 mg | ORAL_TABLET | Freq: Four times a day (QID) | ORAL | Status: DC | PRN

## 2017-05-02 MED ORDER — ONDANSETRON 4 MG PO TBDP
4.0000 mg | ORAL_TABLET | Freq: Once | ORAL | Status: AC
  Administered 2017-05-01: 21:00:00 via ORAL
  Filled 2017-05-02: qty 1

## 2017-05-02 MED ORDER — TOPIRAMATE 100 MG PO TABS
100.0000 mg | ORAL_TABLET | Freq: Two times a day (BID) | ORAL | Status: DC
  Administered 2017-05-02 – 2017-05-04 (×5): 100 mg via ORAL
  Filled 2017-05-02 (×9): qty 1

## 2017-05-02 NOTE — Progress Notes (Signed)
Pt reports no more emesis since admission. She said she was able to go to the cafeteria for dinner but was not able to eat anything substantial.

## 2017-05-02 NOTE — Tx Team (Signed)
Initial Treatment Plan 05/02/2017 3:46 PM Madison Hickman [id number]    PATIENT STRESSORS: Health problems Substance abuse   PATIENT STRENGTHS: Capable of independent living Communication skills Motivation for treatment/growth Physical Health   PATIENT IDENTIFIED PROBLEMS: Depression  Suicidal ideation  Substance abuse  "I want help with my depression"  "I want to get clean from alcohol"             DISCHARGE CRITERIA:  Improved stabilization in mood, thinking, and/or behavior Verbal commitment to aftercare and medication compliance Withdrawal symptoms are absent or subacute and managed without 24-hour nursing intervention  PRELIMINARY DISCHARGE PLAN: Outpatient therapy Medication management  PATIENT/FAMILY INVOLVEMENT: This treatment plan has been presented to and reviewed with the patient, Sara [name].  The patient and family have been given the opportunity to ask questions and make suggestions.  Windell Moment, RN 05/02/2017, 3:46 PM

## 2017-05-02 NOTE — Progress Notes (Signed)
Sara [name] is a 41 year old female being [facility] from [facility].  She came to the Ed via ambulance for alcohol intoxication and suicidal ideation.  She reported drinking a bottle of wine daily.  She has history of cirrhosis, portal HTN and GI bleeding.  She denied HI or A/V hallucinations.  During [facility] admission, she denied SI/HI or A/V hallucinations.  She denied any pain or discomfort.  She did report that she has been taking Pen VK 500mg  qid for tooth infection.  During admission she became nauseated and was vomiting.  Oriented her to the unit.  Admission paperwork completed and signed.  Belongings searched and secured in locker # 7.  Skin assessment completed and noted rash area on face/arms/back (pt reported no treatment per dermatologist).  Q 15 minute checks initiated for safety.  We will monitor the progress towards her goals.

## 2017-05-02 NOTE — Progress Notes (Signed)
D    Pt reports some mild nausea and sweats and chills   And increased anxiety    She is pleasant on approach and cooperative   She is appropriate with others but has limited interaction A    Verbal support and encouragement given   Medications administered and effectiveness monitored    Q 15 min checks    Will continue to monitor for withdrawal R    Pt is safe at present time

## 2017-05-02 NOTE — BH Assessment (Signed)
[facility] Assessment Progress Note  Per Corena Pilgrim, MD, this pt requires psychiatric hospitalization at this time.  [name] RN, [facility] has assigned pt to [facility] Rm 306-2; they will be ready to receive pt at 13:00.  Pt has signed Voluntary Admission and Consent for Treatment, as well as Consent to Release Information to no one, and signed forms have been faxed to [facility].  Pt's nurse, Lala Lund, has been notified, and agrees to send original paperwork along with pt via [place] [place], and to call report to [phone].  Jalene Mullet, [facility] Triage Specialist [phone]

## 2017-05-02 NOTE — ED Notes (Signed)
Patient A&O, sitting on the side of the bed laughing and talking to sitter. Ambulated to the bathroom without difficulty.
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
## 2017-05-02 NOTE — Progress Notes (Signed)
Recreation Therapy Notes  Animal-Assisted Activity (AAA) Program Checklist/Progress Notes Patient Eligibility Criteria Checklist & Daily Group note for Rec TxIntervention  Date: 05/02/2017 Time: 2:55pm Location: 68 hall dayroom  AAA/T Program Assumption of Risk Form signed by Patient/ or Parent Legal Guardian Yes  Patient signed the AAA/T Program Assumption of Risk form, but declined the offer to participate in Animal Assisted Activity upon admission.  Donovan Kail, Recreation Therapy Intern

## 2017-05-02 NOTE — Progress Notes (Signed)
Introduced self to pt after receiving report from [employer]. Pt verbalized feelings of nausea, tremors, headache, and anxiety. She appeared ill at ease and dysphoric but was pleasant. PRN Zofran, Ativan and scheduled multivitamin given. She denied SI, HI, and AVH. She said she had vomited three times. She contracted for safety. Pt remains safe with 15-minute checks and hourly checks in place. Will continue to monitor for needs/safety.

## 2017-05-03 DIAGNOSIS — F332 Major depressive disorder, recurrent severe without psychotic features: Principal | ICD-10-CM

## 2017-05-03 DIAGNOSIS — Z87891 Personal history of nicotine dependence: Secondary | ICD-10-CM

## 2017-05-03 DIAGNOSIS — R197 Diarrhea, unspecified: Secondary | ICD-10-CM

## 2017-05-03 DIAGNOSIS — F1990 Other psychoactive substance use, unspecified, uncomplicated: Secondary | ICD-10-CM

## 2017-05-03 DIAGNOSIS — F1024 Alcohol dependence with alcohol-induced mood disorder: Secondary | ICD-10-CM

## 2017-05-03 DIAGNOSIS — R110 Nausea: Secondary | ICD-10-CM

## 2017-05-03 DIAGNOSIS — Z811 Family history of alcohol abuse and dependence: Secondary | ICD-10-CM
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
## 2017-05-03 NOTE — Progress Notes (Signed)
Recreation Therapy Notes  Date: 05/03/17 Time: 0930 Location: 300 Hall Dayroom  Group Topic: Stress Management  Goal Area(s) Addresses:  Patient will verbalize importance of using healthy stress management.  Patient will identify positive emotions associated with healthy stress management.   Intervention: Stress Managemen  Activity :  LRT introduced the stress management technique of guided imagery.  LRT read a script to allow patients to engage in the activity.  Patients were to follow along as LRT read script to participate in activity.  Education:  Stress Management, Discharge Planning.   Education Outcome: Acknowledges edcuation/In group clarification offered/Needs additional education  Clinical Observations/Feedback: Pt did not attend group.   [name], LRT/CTRS         [name] A 05/03/2017 2:36 PM

## 2017-05-03 NOTE — Progress Notes (Signed)
Adult Psychoeducational Group Note  Date:  05/03/2017 Time:  10:30 AM  Group Topic/Focus:  Personal Choices and Values:   The focus of this group is to help patients assess and explore the importance of values in their lives, how their values affect their decisions, how they express their values and what opposes their expression.  Participation Level:  Active  Participation Quality:  Appropriate  Affect:  Appropriate  Cognitive:  Alert  Insight: Good  Engagement in Group:  Engaged  Modes of Intervention:  Discussion and Education  Additional Comments:  Pt attended group and participated well.  Netanel Yannuzzi E 05/03/2017, 10:30 AM
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
## 2017-05-03 NOTE — Plan of Care (Signed)
Problem: Medication: Goal: Compliance with prescribed medication regimen will improve Outcome: Progressing Pt is taking meds as prescribed.    

## 2017-05-03 NOTE — Progress Notes (Signed)
D: Spoke with patient 1:1 who is quiet, cautious. Experiencing withdrawal symptoms - sweats, nausea, tremors, anxiety, crawling skin. Patient's affect flat, sad and anxious with congruent mood. Rating depression at a 0/10, hopelessness at a 0/10 and anxiety at a 7/10. Rates sleep as fair, appetite as poor, energy as low and concentration as poor. States goal for today is to "try to increase my appetite and eat a little more." Denies pain.   A: Medicated per orders, prn zofran given this AM. Emotional support offered and self inventory reviewed. Encouraged completion of Suicide Safety Plan as patient did report SI in the ED. Discussed POC with MD, SW.  R: Patient verbalizes understanding of POC. On reassess, patient reports some decrease in nausea. Was able to eat some lunch. Patient denies SI/HI and remains safe on level III obs. Will continue POC, encourage programming participation.

## 2017-05-03 NOTE — [facility] Suicide Risk Assessment (Signed)
[name] INPATIENT:  Family/Significant Other Suicide Prevention Education  Suicide Prevention Education:  Education Completed; Sara [name] (pt's husband) [phone] has been identified by the patient as the family member/significant other with whom the patient will be residing, and identified as the person(s) who will aid the patient in the event of a mental health crisis (suicidal ideations/suicide attempt).  With written consent from the patient, the family member/significant other has been provided the following suicide prevention education, prior to the and/or following the discharge of the patient.  The suicide prevention education provided includes the following:  Suicide risk factors  Suicide prevention and interventions  National Suicide Hotline telephone number  [facility] assessment telephone number  [facility] Emergency Assistance [place] and/or Residential Mobile Crisis Unit telephone number  Request made of family/significant other to:  Remove weapons (e.g., guns, rifles, knives), all items previously/currently identified as safety concern.    Remove drugs/medications (over-the-counter, prescriptions, illicit drugs), all items previously/currently identified as a safety concern.  The family member/significant other verbalizes understanding of the suicide prevention education information provided.  The family member/significant other agrees to remove the items of safety concern listed above.  Pt's husband sahred that pt's bipolar symptoms have intensified over the past several months. He reports that she is rapidly cycling--every 2-3 weeks between manic highs and lows. He states that she is resistant to taking psychiatric medication and attending AA. Patient drinks heavily regardless of what mood she is in. No guns/weapons in the home. No concerns regarding SI/HI per pt's husband.   Sara [name] Smart LCSW 05/03/2017, 3:48 PM

## 2017-05-03 NOTE — Progress Notes (Signed)
  DATA ACTION RESPONSE  Objective- Pt. is visible in the dayroom, seen eating a snack quietly. Presents with a flat/depressed/anxious   affect and mood. Mild tremors in bilat. hands. Pt. appears not satisfied with sleep regimen. Was encourage to talk to MD tomorrow. Pt. states vistaril has the opposite effect for managing anxiety.  Subjective- Denies having any SI/HI/AVH/Pain at this time. Is cooperative and remain safe on the unit.  1:1 interaction in private to establish rapport. Encouragement, education, & support given from staff.     Safety maintained with Q 15 checks. Continue with POC.

## 2017-05-03 NOTE — Plan of Care (Signed)
Problem: Safety: Goal: Periods of time without injury will increase Outcome: Progressing Patient has not engaged in self harm, denies SI.  Problem: Medication: Goal: Compliance with prescribed medication regimen will improve Outcome: Progressing Patient is med compliant.

## 2017-05-03 NOTE — Tx Team (Signed)
Interdisciplinary Treatment and Diagnostic Plan Update  05/03/2017 Time of Session: [place] MRN: [id number]  Principal Diagnosis: Bipolar Disorder  Secondary Diagnoses: Active Problems:   Major depressive disorder, recurrent severe without psychotic features ([place])   Current Medications:  Current Facility-Administered Medications  Medication Dose Route Frequency Provider Last Rate Last Dose  . acetaminophen (TYLENOL) tablet 650 mg  650 mg Oral Q6H PRN Patrecia Pour, NP      . alum & mag hydroxide-simeth (MAALOX/MYLANTA) 200-200-20 MG/5ML suspension 30 mL  30 mL Oral Q4H PRN Patrecia Pour, NP      . citalopram (CELEXA) tablet 20 mg  20 mg Oral Daily Patrecia Pour, NP   20 mg at 05/03/17 0757  . folic acid (FOLVITE) tablet 1 mg  1 mg Oral Daily Patrecia Pour, NP   1 mg at 05/03/17 0757  . hydrOXYzine (ATARAX/VISTARIL) tablet 25 mg  25 mg Oral Q6H PRN Lindell Spar I, NP      . loperamide (IMODIUM) capsule 2-4 mg  2-4 mg Oral PRN Lindell Spar I, NP      . LORazepam (ATIVAN) tablet 1 mg  1 mg Oral Q6H PRN Lindell Spar I, NP   1 mg at 05/02/17 1456  . LORazepam (ATIVAN) tablet 1 mg  1 mg Oral QID Lindell Spar I, NP   1 mg at 05/03/17 0757   Followed by  . LORazepam (ATIVAN) tablet 1 mg  1 mg Oral TID Lindell Spar I, NP       Followed by  . [START ON 05/04/2017] LORazepam (ATIVAN) tablet 1 mg  1 mg Oral BID Lindell Spar I, NP       Followed by  . [START ON 05/06/2017] LORazepam (ATIVAN) tablet 1 mg  1 mg Oral Daily Nwoko, Agnes I, NP      . magnesium hydroxide (MILK OF MAGNESIA) suspension 30 mL  30 mL Oral Daily PRN Patrecia Pour, NP      . multivitamin with minerals tablet 1 tablet  1 tablet Oral Daily Lindell Spar I, NP   1 tablet at 05/03/17 0757  . ondansetron (ZOFRAN-ODT) disintegrating tablet 4 mg  4 mg Oral Q6H PRN Lindell Spar I, NP   4 mg at 05/03/17 0757  . thiamine (B-1) injection 100 mg  100 mg Intravenous Daily Waylan Boga Y, NP      . thiamine (VITAMIN B-1)  tablet 100 mg  100 mg Oral Daily Lindell Spar I, NP   100 mg at 05/03/17 0757  . topiramate (TOPAMAX) tablet 100 mg  100 mg Oral BID Patrecia Pour, NP   100 mg at 05/03/17 0757  . traZODone (DESYREL) tablet 100 mg  100 mg Oral QHS Lindell Spar I, NP   100 mg at 05/02/17 2147   PTA Medications: Prescriptions Prior to Admission  Medication Sig Dispense Refill Last Dose  . naproxen (NAPROSYN) 500 MG tablet Take 1 tablet (500 mg total) by mouth 2 (two) times daily. (Patient not taking: Reported on 05/01/2017) 30 tablet 0 Completed Course at Unknown time  . penicillin v potassium (VEETID) 500 MG tablet Take 500 mg by mouth 4 (four) times daily.   Unknown at Unknown time    Patient Stressors: Health problems Substance abuse  Patient Strengths: Capable of independent living Agricultural engineer for treatment/growth Physical Health  Treatment Modalities: Medication Management, Group therapy, Case management,  1 to 1 session with clinician, Psychoeducation, Recreational therapy.   Physician Treatment Plan for Primary  Diagnosis: Bipolar Disorder  Medication Management: Evaluate patient's response, side effects, and tolerance of medication regimen.  Therapeutic Interventions: 1 to 1 sessions, Unit Group sessions and Medication administration.  Evaluation of Outcomes: Progressing  Physician Treatment Plan for Secondary Diagnosis: Active Problems:   Major depressive disorder, recurrent severe without psychotic features ([place])  Long Term Goal(s):     Short Term Goals:       Medication Management: Evaluate patient's response, side effects, and tolerance of medication regimen.  Therapeutic Interventions: 1 to 1 sessions, Unit Group sessions and Medication administration.  Evaluation of Outcomes: Progressing   RN Treatment Plan for Primary Diagnosis: Bipolar Disorder Long Term Goal(s): Knowledge of disease and therapeutic regimen to maintain health will improve  Short Term  Goals: Ability to remain free from injury will improve, Ability to verbalize feelings will improve and Ability to disclose and discuss suicidal ideas  Medication Management: RN will administer medications as ordered by provider, will assess and evaluate patient's response and provide education to patient for prescribed medication. RN will report any adverse and/or side effects to prescribing provider.  Therapeutic Interventions: 1 on 1 counseling sessions, Psychoeducation, Medication administration, Evaluate responses to treatment, Monitor vital signs and CBGs as ordered, Perform/monitor CIWA, COWS, AIMS and Fall Risk screenings as ordered, Perform wound care treatments as ordered.  Evaluation of Outcomes: Progressing   LCSW Treatment Plan for Primary Diagnosis: Bipolar Disorder Long Term Goal(s): Safe transition to appropriate next level of care at discharge, Engage patient in therapeutic group addressing interpersonal concerns.  Short Term Goals: Engage patient in aftercare planning with referrals and resources, Facilitate patient progression through stages of change regarding substance use diagnoses and concerns and Identify triggers associated with mental health/substance abuse issues  Therapeutic Interventions: Assess for all discharge needs, 1 to 1 time with Social worker, Explore available resources and support systems, Assess for adequacy in community support network, Educate family and significant other(s) on suicide prevention, Complete Psychosocial Assessment, Interpersonal group therapy.  Evaluation of Outcomes: Progressing   Progress in Treatment: Attending groups: Yes. Participating in groups: Yes. Taking medication as prescribed: Yes. Toleration medication: Yes. Family/Significant other contact made: No, will contact:  pt's husband Patient understands diagnosis: Yes. Discussing patient identified problems/goals with staff: Yes. Medical problems stabilized or resolved:  Yes. Denies suicidal/homicidal ideation: Yes. Issues/concerns per patient self-inventory: No. Other: n/a   New problem(s) identified: No, Describe:  n/a  New Short Term/Long Term Goal(s): alcohol detox; medication stabilization, development of comprehensive mental wellness/sobriety plan.   Discharge Plan or Barriers: CSW assessing--pt plans to return home; follow[facility]up at [facility] for medication management. [facility] information provided. AA list provided and pt is active in [place].   Reason for Continuation of Hospitalization: Depression Medication stabilization Withdrawal symptoms  Estimated Length of Stay: 3-5 days   Attendees: Patient: 05/03/2017 11:22 AM  Physician: Dr. Parke Poisson MD 05/03/2017 11:22 AM  Nursing: Samul Dada RN 05/03/2017 11:22 AM  RN Care Manager: Lars Pinks CM 05/03/2017 11:22 AM  Social Worker: Press photographer, LCSW 05/03/2017 11:22 AM  Recreational Therapist: x 05/03/2017 11:22 AM  Other: Lindell Spar NP 05/03/2017 11:22 AM  Other:  05/03/2017 11:22 AM  Other: 05/03/2017 11:22 AM    Scribe for Treatment Team: Kimber Relic Smart, LCSW 05/03/2017 11:22 AM

## 2017-05-03 NOTE — BHH Group Notes (Signed)
[address] LCSW Group Therapy 05/03/2017 1:15 PM  Type of Therapy: Group Therapy- Emotion Regulation  Participation Level: Reserved  Participation Quality:  Appropriate  Affect: Appropriate  Cognitive: Alert and Oriented   Insight:  Developing/Improving  Engagement in Therapy: Developing/Improving and Engaged   Modes of Intervention: Clarification, Confrontation, Discussion, Education, Exploration, Limit-setting, Orientation, Problem-solving, Rapport Building, Art therapist, Socialization and Support  Summary of Progress/Problems: The topic for group today was emotional regulation. This group focused on both positive and negative emotion identification and allowed group members to process ways to identify feelings, regulate negative emotions, and find healthy ways to manage internal/external emotions. Group members were asked to reflect on a time when their reaction to an emotion led to a negative outcome and explored how alternative responses using emotion regulation would have benefited them. Group members were also asked to discuss a time when emotion regulation was utilized when a negative emotion was experienced. Pt participated appropriately, identifying grief as an emotion she has difficulty regulating. Pt reports that she often self-medicates with alcohol. Pt reports that she often takes a walk or calls her friends to help regulate sadness and grief.   Adriana Reams, LCSW 05/03/2017 3:04 PM
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
## 2017-05-04 DIAGNOSIS — G4700 Insomnia, unspecified: Secondary | ICD-10-CM

## 2017-05-04 LAB — LIPID PANEL
CHOL/HDL RATIO: 3 ratio
CHOLESTEROL: 188 mg/dL (ref 0–200)
HDL: 63 mg/dL (ref >40)
LDL Cholesterol: 110 mg/dL — ABNORMAL HIGH (ref 0–99)
Triglycerides: 73 mg/dL (ref <150)
VLDL: 15 mg/dL (ref 0–40)

## 2017-05-04 LAB — PROTIME-INR
INR: 1.28
Prothrombin Time: 16 seconds — ABNORMAL HIGH (ref 11.4–15.2)

## 2017-05-04 LAB — AMMONIA: Ammonia: 54 umol/L — ABNORMAL HIGH (ref 9–35)

## 2017-05-04 MED ORDER — ESCITALOPRAM OXALATE 5 MG PO TABS
5.0000 mg | ORAL_TABLET | Freq: Every day | ORAL | Status: DC
  Administered 2017-05-05 – 2017-05-08 (×4): 5 mg via ORAL
  Filled 2017-05-04: qty 1
  Filled 2017-05-04: qty 7
  Filled 2017-05-04 (×4): qty 1

## 2017-05-04 MED ORDER — TRAZODONE HCL 50 MG PO TABS
50.0000 mg | ORAL_TABLET | Freq: Every evening | ORAL | Status: DC | PRN
  Administered 2017-05-04: 50 mg via ORAL
  Filled 2017-05-04: qty 1

## 2017-05-04 MED ORDER — ACAMPROSATE CALCIUM 333 MG PO TBEC
666.0000 mg | DELAYED_RELEASE_TABLET | Freq: Three times a day (TID) | ORAL | Status: DC
Start: 2017-05-05 — End: 2017-05-08
  Administered 2017-05-05 – 2017-05-08 (×11): 666 mg via ORAL
  Filled 2017-05-04 (×2): qty 2
  Filled 2017-05-04: qty 42
  Filled 2017-05-04 (×5): qty 2
  Filled 2017-05-04: qty 42
  Filled 2017-05-04 (×2): qty 2
  Filled 2017-05-04: qty 42
  Filled 2017-05-04 (×5): qty 2

## 2017-05-04 MED ORDER — TOPIRAMATE 25 MG PO TABS
25.0000 mg | ORAL_TABLET | Freq: Two times a day (BID) | ORAL | Status: DC
  Administered 2017-05-05 – 2017-05-08 (×7): 25 mg via ORAL
  Filled 2017-05-04 (×9): qty 1
  Filled 2017-05-04: qty 14
  Filled 2017-05-04 (×2): qty 1
  Filled 2017-05-04: qty 14

## 2017-05-04 MED ORDER — CITALOPRAM HYDROBROMIDE 10 MG PO TABS
10.0000 mg | ORAL_TABLET | Freq: Every day | ORAL | Status: DC
  Filled 2017-05-04: qty 1

## 2017-05-04 NOTE — Progress Notes (Signed)
Patient attended NA group meeting. 
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
## 2017-05-05 LAB — HEMOGLOBIN A1C
Hgb A1c MFr Bld: 4.5 % — ABNORMAL LOW (ref 4.8–5.6)
Mean Plasma Glucose: 82 mg/dL

## 2017-05-05 LAB — PROLACTIN: Prolactin: 28.7 ng/mL — ABNORMAL HIGH (ref 4.8–23.3)

## 2017-05-05 MED ORDER — QUETIAPINE FUMARATE 25 MG PO TABS
25.0000 mg | ORAL_TABLET | Freq: Every evening | ORAL | Status: DC | PRN
  Administered 2017-05-05 – 2017-05-07 (×3): 25 mg via ORAL
  Filled 2017-05-05 (×3): qty 1
  Filled 2017-05-05: qty 7

## 2017-05-05 NOTE — [facility] Group Notes (Signed)
[facility] LCSW Group Therapy  05/05/2017 1:26 PM  Type of Therapy:  Group Therapy  Participation Level:  Minimal  Participation Quality:  Attentive  Affect:  Blunted  Cognitive:  Oriented  Insight:  Limited  Engagement in Therapy:  Limited  Modes of Intervention:  Confrontation, Discussion, Education, Problem-solving, Socialization and Support  Summary of Progress/Problems: Feelings around Relapse. Group members discussed the meaning of relapse and shared personal stories of relapse, how it affected them and others, and how they perceived themselves during this time. Group members were encouraged to identify triggers, warning signs and coping skills used when facing the possibility of relapse. Social supports were discussed and explored in detail. Post Acute Withdrawal Syndrome (handout provided) was introduced and examined. Pt's were encouraged to ask questions, talk about key points associated with PAWS, and process this information in terms of relapse prevention.   Demetris Meinhardt N Smart LCSW 05/05/2017, 1:26 PM

## 2017-05-05 NOTE — Progress Notes (Signed)
D: Pt was in the hallway upon initial approach.  Pt presents with depressed affect and mood.  Her goal is to "eat a little bit more."  Pt reports she met goal.  Pt denies SI/HI, denies hallucinations, denies pain, reports withdrawal symptom of tremor and anxiety.  Pt has been visible in milieu interacting with peers and staff appropriately.  Pt attended evening group.    A: Introduced self to pt.  Actively listened to pt and offered support and encouragement. PRN medication administered for sleep.  Q15 minute safety checks maintained.  R: Pt is safe on the unit.  Pt is compliant with medication.  Pt verbally contracts for safety.  Will continue to monitor and assess.

## 2017-05-05 NOTE — Plan of Care (Signed)
Problem: Education: Goal: Knowledge of the prescribed therapeutic regimen will improve Outcome: Progressing Patient has taken her medications as prescribed this shift.
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
## 2017-05-05 NOTE — Progress Notes (Signed)
Data. Patient denies SI/HI/AVH. Patient interacting minimally with staff and other patients. Patient has been sitting in the day room, but by herself and only interacts when others initiate. Affect is flat, but does brighten minimally with interaction. Patient hs had very low BPs this shift. Fluids have been pushed. BP by 5pm is WDL. Patient's goal for today is, "Being more active." Action. Emotional support and encouragement offered. Education provided on medication, indications and side effect. Q 15 minute checks done for safety. Response. Safety on the unit maintained through 15 minute checks.  Medications taken as prescribed. Attended groups. Remained calm and appropriate through out shift.

## 2017-05-05 NOTE — Progress Notes (Signed)
Patient attended AA group meeting.  

## 2017-05-06 DIAGNOSIS — F1094 Alcohol use, unspecified with alcohol-induced mood disorder: Secondary | ICD-10-CM

## 2017-05-06 NOTE — Progress Notes (Signed)
Data. Patient denies SI/HI/AVH. Patient interacting well with staff and other patients, but she is very quiet in the day room and does not initiate interactions. Patient only gives very minimal responses to questions.  On her self assessment patient reports her goal as, "Steady my pace and stance." Action. Emotional support and encouragement offered. Education provided on medication, indications and side effect. Q 15 minute checks done for safety. Response. Safety on the unit maintained through 15 minute checks.  Medications taken as prescribed. Attended groups. Remained calm and appropriate through out shift.

## 2017-05-06 NOTE — Progress Notes (Signed)
D: Pt was in the dayroom upon initial approach.  Pt presents with depressed affect and mood.  Her goal is to "sleep well without waking up."  Pt denies SI/HI, denies hallucinations, denies pain, reports withdrawal symptom of tremor.  Pt has been visible in milieu with few peer interactions.  She forwards little information to Probation officer.  Pt attended evening group.    A:  Actively listened to pt and offered support and encouragement.  PRN medication administered for sleep.  Q15 minute safety checks maintained.  R: Pt is safe on the unit.  Pt is compliant with medication.  Pt verbally contracts for safety.  Will continue to monitor and assess.

## 2017-05-06 NOTE — [facility] Group Notes (Signed)
[facility] LCSW Group Therapy Note  05/06/2017  and  10:15 AM  Type of Therapy and Topic:  Group Therapy: Avoiding Self-Sabotaging and Enabling Behaviors  Participation Level:  Did Not Attend; invited to participate yet did not despite overhead announcement and encouragement by staff  Sheilah Pigeon, LCSW

## 2017-05-06 NOTE — [facility] Group Notes (Signed)
[facility] Group Notes:  (Nursing/MHT/Case Management/Adjunct)  Date:  05/06/2017  Time:  4:29 PM  Type of Therapy:  Nurse Education  Participation Level:  Active  Participation Quality:  Appropriate  Affect:  Appropriate  Cognitive:  Appropriate  Insight:  Appropriate  Engagement in Group:  Engaged  Modes of Intervention:  Activity  Summary of Progress/Problems:  Patients wrote positive attributes on pertaining to their peers, then accepted positive attributes written about them.   Cheri Kearns 05/06/2017, 4:29 PM

## 2017-05-06 NOTE — Progress Notes (Signed)
Patient did attend the evening speaker AA meeting.  
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
## 2017-05-07 DIAGNOSIS — F39 Unspecified mood [affective] disorder: Secondary | ICD-10-CM
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
## 2017-05-07 NOTE — Plan of Care (Signed)
Problem: Coping: Goal: Ability to demonstrate self-control will improve Outcome: Progressing Pt has maintained control of her behavior tonight.

## 2017-05-07 NOTE — Progress Notes (Signed)
D    Pt is pleasant on approach   She attends and participates in groups   She reports much improvement in her mood since hospitalization   She denies any withdrawal symptoms  A    Verbal support given    Medications administered and effectiveness monitored    Q 15 min checks  R  Pt is safe at present time

## 2017-05-07 NOTE — BHH Group Notes (Signed)
[facility] Group Notes:  (Nursing/MHT/Case Management/Adjunct)  Date:  05/07/2017  Time:  4:43 PM  Type of Therapy:  Nurse Education  Participation Level:  Active  Participation Quality:  Appropriate  Affect:  Appropriate  Cognitive:  Appropriate  Insight:  Appropriate  Engagement in Group:  Engaged  Modes of Intervention:  Activity, Discussion and Education  Summary of Progress/Problems:  Group was focused on changing negative self talk to positive ans choosing an active, vs passive recovery.   Cheri Kearns 05/07/2017, 4:43 PM

## 2017-05-07 NOTE — Plan of Care (Signed)
Problem: Safety: Goal: Periods of time without injury will increase Outcome: Progressing Patient remained safe on the unit, this shift.

## 2017-05-07 NOTE — BHH Group Notes (Signed)
[facility] LCSW Group Therapy  05/07/2017 10 AM  Type of Therapy:  Group Therapy  Participation Level:  Active  Participation Quality:  Attentive and Sharing  Affect:  Appropriate  Cognitive:  Alert and Oriented  Insight:  Limited  Engagement in Therapy:  Limited  Modes of Intervention:  Discussion, Education, Exploration, Problem-solving, Socialization and Support  Summary of Progress/Problems: Topic for today was thoughts and feelings regarding discharge. We discussed fears of upcoming changes including judgements, expectations and stigma of mental health issues. We then discussed supports: what constitutes a supportive framework, identification of supports and what to do when others are not supportive. Patients then discussed a specific coping tool to use when others are not available. Patient shared she will add AA groups to her current list of supports.    Sheilah Pigeon, LCSW

## 2017-05-07 NOTE — Progress Notes (Signed)
D: Pt was in the dayroom upon initial approach.  Pt presents with depressed affect and mood.  She cautiously interacts with others, but has been more visible in milieu tonight compared to past 2 nights.  Her goal was to "raise my blood pressure by drinking more water and not sleeping as much."  She reports she has been interacting more with peers and going to groups.  Pt denies SI/HI, denies hallucinations, denies pain.  Pt attended evening group.    A: Actively listened to pt and offered support and encouragement. PO fluids encouraged.  PRN medication administered for sleep.  Q15 minute safety checks maintained.  R: Pt is safe on the unit.  Pt is compliant with medication.  Pt verbally contracts for safety.  Will continue to monitor and assess.

## 2017-05-07 NOTE — Progress Notes (Signed)
Data. Patient denies SI/HI/AVH.   Patient interacting well with staff and other patients.  Action. Emotional support and encouragement offered. Education provided on medication, indications and side effect. Q 15 minute checks done for safety. Response. Safety on the unit maintained through 15 minute checks.  Medications taken as prescribed. Attended groups. Remained calm and appropriate through out shift.

## 2017-05-08 MED ORDER — ACAMPROSATE CALCIUM 333 MG PO TBEC: 666.0000 mg | DELAYED_RELEASE_TABLET | Freq: Three times a day (TID) | ORAL | 0 refills | Status: DC

## 2017-05-08 MED ORDER — FOLIC ACID 1 MG PO TABS: 1.0000 mg | ORAL_TABLET | Freq: Every day | ORAL | Status: DC

## 2017-05-08 MED ORDER — QUETIAPINE FUMARATE 25 MG PO TABS: 25.0000 mg | ORAL_TABLET | Freq: Every evening | ORAL | 0 refills | Status: DC | PRN

## 2017-05-08 MED ORDER — TOPIRAMATE 25 MG PO TABS: 25.0000 mg | ORAL_TABLET | Freq: Two times a day (BID) | ORAL | 0 refills | Status: DC

## 2017-05-08 MED ORDER — ESCITALOPRAM OXALATE 5 MG PO TABS: 5.0000 mg | ORAL_TABLET | Freq: Every day | ORAL | 0 refills | Status: DC

## 2017-05-08 NOTE — Progress Notes (Signed)
Pt discharged home with her husband. Pt was ambulatory, stable and appreciative at that time. All papers and prescriptions were given and valuables returned. Verbal understanding expressed. Denies SI/HI and A/VH. Pt given opportunity to express concerns and ask questions.  

## 2017-05-08 NOTE — Progress Notes (Signed)
  [facility] Adult Case Management Discharge Plan :  Will you be returning to the same living situation after discharge:  Yes,  home At discharge, do you have transportation home?: Yes,  husband Do you have the ability to pay for your medications: Yes,  mental health  Release of information consent forms completed and submitted to medical records by CSW.  Patient to Follow up at: Follow-up Information    [facility] Follow up.   Specialty:  Behavioral Health Why:  Please use [facility] to establish for medications management and therapy services, hours are Mon-Fri 8:30-3.  Arrive early for prompt service.   Contact information: [place] [address] [phone]           Next level of care provider has access to [place] and Suicide Prevention discussed: Yes,  SPE completed with both pt and her husband. SPI pamphlet and Mobile Crisis information provided to pt.  Have you used any form of tobacco in the last 30 days? (Cigarettes, Smokeless Tobacco, Cigars, and/or Pipes): Yes  Has patient been referred to the Quitline?: Patient refused referral  Patient has been referred for addiction treatment: Yes  Khadar Monger N Smart LCSW 05/08/2017, 9:34 AM

## 2017-05-08 NOTE — Progress Notes (Signed)
Recreation Therapy Notes  Date: 05/08/17 Time: 0930 Location: 300 Hall Group Room  Group Topic: Stress Management  Goal Area(s) Addresses:  Patient will verbalize importance of using healthy stress management.  Patient will identify positive emotions associated with healthy stress management.   Intervention: Stress Management  Activity :  Guided Imagery.  LRT introduced the stress management technique of guided imagery.  LRT read a script to allow patients to engage in the technique.  Patients were to follow along as the script was read to fully participate.  Education:  Stress Management, Discharge Planning.   Education Outcome: Acknowledges edcuation/In group clarification offered/Needs additional education  Clinical Observations/Feedback: Pt did not attend group.   [name], LRT/CTRS         Ria Comment, Tenesha Garza A 05/08/2017 2:28 PM
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
## 2017-05-08 NOTE — Tx Team (Signed)
Interdisciplinary Treatment and Diagnostic Plan Update  05/08/2017 Time of Session: [facility] MRN: [id number]  Principal Diagnosis: Bipolar Disorder  Secondary Diagnoses: Active Problems:   Major depressive disorder, recurrent severe without psychotic features ([place])   Current Medications:  Current Facility-Administered Medications  Medication Dose Route Frequency Provider Last Rate Last Dose  . acamprosate (CAMPRAL) tablet 666 mg  666 mg Oral TID WC Cobos, Myer Peer, MD   666 mg at 05/08/17 3329  . alum & mag hydroxide-simeth (MAALOX/MYLANTA) 200-200-20 MG/5ML suspension 30 mL  30 mL Oral Q4H PRN Patrecia Pour, NP   30 mL at 05/07/17 2208  . escitalopram (LEXAPRO) tablet 5 mg  5 mg Oral Daily Cobos, Myer Peer, MD   5 mg at 05/08/17 0818  . folic acid (FOLVITE) tablet 1 mg  1 mg Oral Daily Patrecia Pour, NP   1 mg at 05/08/17 0818  . magnesium hydroxide (MILK OF MAGNESIA) suspension 30 mL  30 mL Oral Daily PRN Patrecia Pour, NP      . multivitamin with minerals tablet 1 tablet  1 tablet Oral Daily Lindell Spar I, NP   1 tablet at 05/08/17 0818  . QUEtiapine (SEROQUEL) tablet 25 mg  25 mg Oral QHS PRN Cobos, Myer Peer, MD   25 mg at 05/07/17 2207  . thiamine (B-1) injection 100 mg  100 mg Intravenous Daily Patrecia Pour, NP   100 mg at 05/05/17 1211  . thiamine (VITAMIN B-1) tablet 100 mg  100 mg Oral Daily Lindell Spar I, NP   100 mg at 05/08/17 0818  . topiramate (TOPAMAX) tablet 25 mg  25 mg Oral BID Cobos, Myer Peer, MD   25 mg at 05/08/17 0818   PTA Medications: Prescriptions Prior to Admission  Medication Sig Dispense Refill Last Dose  . naproxen (NAPROSYN) 500 MG tablet Take 1 tablet (500 mg total) by mouth 2 (two) times daily. (Patient not taking: Reported on 05/01/2017) 30 tablet 0 Completed Course at Unknown time  . penicillin v potassium (VEETID) 500 MG tablet Take 500 mg by mouth 4 (four) times daily.   Unknown at Unknown time    Patient Stressors: Health  problems Substance abuse  Patient Strengths: Capable of independent living Agricultural engineer for treatment/growth Physical Health  Treatment Modalities: Medication Management, Group therapy, Case management,  1 to 1 session with clinician, Psychoeducation, Recreational therapy.   Physician Treatment Plan for Primary Diagnosis: Bipolar Disorder  Medication Management: Evaluate patient's response, side effects, and tolerance of medication regimen.  Therapeutic Interventions: 1 to 1 sessions, Unit Group sessions and Medication administration.  Evaluation of Outcomes: Met  Physician Treatment Plan for Secondary Diagnosis: Active Problems:   Major depressive disorder, recurrent severe without psychotic features ([place])  Long Term Goal(s): Improvement in symptoms so as ready for discharge Improvement in symptoms so as ready for discharge   Short Term Goals: Ability to identify triggers associated with substance abuse/mental health issues will improve Ability to verbalize feelings will improve Ability to disclose and discuss suicidal ideas Ability to demonstrate self-control will improve Ability to identify and develop effective coping behaviors will improve Ability to maintain clinical measurements within normal limits will improve     Medication Management: Evaluate patient's response, side effects, and tolerance of medication regimen.  Therapeutic Interventions: 1 to 1 sessions, Unit Group sessions and Medication administration.  Evaluation of Outcomes: Met   RN Treatment Plan for Primary Diagnosis: Bipolar Disorder Long Term Goal(s): Knowledge of disease and  therapeutic regimen to maintain health will improve  Short Term Goals: Ability to remain free from injury will improve, Ability to verbalize feelings will improve and Ability to disclose and discuss suicidal ideas  Medication Management: RN will administer medications as ordered by provider, will assess and  evaluate patient's response and provide education to patient for prescribed medication. RN will report any adverse and/or side effects to prescribing provider.  Therapeutic Interventions: 1 on 1 counseling sessions, Psychoeducation, Medication administration, Evaluate responses to treatment, Monitor vital signs and CBGs as ordered, Perform/monitor CIWA, COWS, AIMS and Fall Risk screenings as ordered, Perform wound care treatments as ordered.  Evaluation of Outcomes: Met   LCSW Treatment Plan for Primary Diagnosis: Bipolar Disorder Long Term Goal(s): Safe transition to appropriate next level of care at discharge, Engage patient in therapeutic group addressing interpersonal concerns.  Short Term Goals: Engage patient in aftercare planning with referrals and resources, Facilitate patient progression through stages of change regarding substance use diagnoses and concerns and Identify triggers associated with mental health/substance abuse issues  Therapeutic Interventions: Assess for all discharge needs, 1 to 1 time with Social worker, Explore available resources and support systems, Assess for adequacy in community support network, Educate family and significant other(s) on suicide prevention, Complete Psychosocial Assessment, Interpersonal group therapy.  Evaluation of Outcomes: Met   Progress in Treatment: Attending groups: Yes. Participating in groups: Yes. Taking medication as prescribed: Yes. Toleration medication: Yes. Family/Significant other contact made:SPE completed with pt's husband.  Patient understands diagnosis: Yes. Discussing patient identified problems/goals with staff: Yes. Medical problems stabilized or resolved: Yes. Denies suicidal/homicidal ideation: Yes. Issues/concerns per patient self-inventory: No. Other: n/a   New problem(s) identified: No, Describe:  n/a  New Short Term/Long Term Goal(s): alcohol detox; medication stabilization, development of comprehensive  mental wellness/sobriety plan.   Discharge Plan or Barriers: CSW assessing--pt plans to return home; follow-up at [facility] for medication management. [facility] information provided. AA list provided and pt is active in [place].   Reason for Continuation of Hospitalization: none  Estimated Length of Stay: discharge today   Attendees: Patient: 05/08/2017 9:35 AM  Physician: Dr. Parke Poisson MD 05/08/2017 9:35 AM  Nursing: Jonni Sanger RN 05/08/2017 9:35 AM  RN Care Manager: Lars Pinks CM 05/08/2017 9:35 AM  Social Worker: Maxie Better, LCSW 05/08/2017 9:35 AM  Recreational Therapist: x 05/08/2017 9:35 AM  Other: Lindell Spar NP; Ricky Ala NP 05/08/2017 9:35 AM  Other:  05/08/2017 9:35 AM  Other: 05/08/2017 9:35 AM    Scribe for Treatment Team: [place], LCSW 05/08/2017 9:35 AM
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
## 2017-07-31 ENCOUNTER — Encounter: Payer: Self-pay | Admitting: Hematology and Oncology

## 2017-07-31 ENCOUNTER — Telehealth: Payer: Self-pay | Admitting: Hematology and Oncology

## 2017-07-31 NOTE — Progress Notes (Signed)
Pt called with billing questions because she no longer has health ins.  I informed her for self-pay pts she will automatically receive a 55% discount and after that she can set up payment arrangements with the billing department.  She verbalized understanding and will keep her appt on 08/15/17.

## 2017-07-31 NOTE — Telephone Encounter (Signed)
Appt has been scheduled for the pt to see Dr. Lindi Adie on 9/25 at 1pm. Pt aware to arrive 30 minutes early. Gave the patient the phone to a financial counselor since she is self-pay. Letter mailed to the pt.

## 2017-08-15 ENCOUNTER — Ambulatory Visit (HOSPITAL_BASED_OUTPATIENT_CLINIC_OR_DEPARTMENT_OTHER): Payer: Self-pay

## 2017-08-15 ENCOUNTER — Ambulatory Visit (HOSPITAL_BASED_OUTPATIENT_CLINIC_OR_DEPARTMENT_OTHER): Payer: Self-pay | Admitting: Hematology and Oncology

## 2017-08-15 ENCOUNTER — Other Ambulatory Visit (HOSPITAL_COMMUNITY)
Admission: RE | Admit: 2017-08-15 | Discharge: 2017-08-15 | Disposition: A | Discharge status: Home / Self Care | Payer: Self-pay | Source: Ambulatory Visit | Attending: Hematology and Oncology | Admitting: Hematology and Oncology

## 2017-08-15 ENCOUNTER — Telehealth: Payer: Self-pay

## 2017-08-15 VITALS — BP 104/59 | HR 65 | Temp 98.5°F | Resp 18 | Ht 64.0 in | Wt 151.1 lb

## 2017-08-15 DIAGNOSIS — D709 Neutropenia, unspecified: Secondary | ICD-10-CM

## 2017-08-15 DIAGNOSIS — D61818 Other pancytopenia: Secondary | ICD-10-CM

## 2017-08-15 DIAGNOSIS — D696 Thrombocytopenia, unspecified: Secondary | ICD-10-CM

## 2017-08-15 DIAGNOSIS — D649 Anemia, unspecified: Secondary | ICD-10-CM

## 2017-08-15 LAB — CBC WITH DIFFERENTIAL/PLATELET
BASO%: 0.2 % (ref 0.0–2.0)
BASOS ABS: 0 10*3/uL (ref 0.0–0.1)
EOS%: 1 % (ref 0.0–7.0)
Eosinophils Absolute: 0 10*3/uL (ref 0.0–0.5)
HEMATOCRIT: 30.2 % — AB (ref 34.8–46.6)
HEMOGLOBIN: 10 g/dL — AB (ref 11.6–15.9)
LYMPH%: 34.6 % (ref 14.0–49.7)
MCH: 30.1 pg (ref 25.1–34.0)
MCHC: 33.1 g/dL (ref 31.5–36.0)
MCV: 90.9 fL (ref 79.5–101.0)
MONO#: 0.2 10*3/uL (ref 0.1–0.9)
MONO%: 6.5 % (ref 0.0–14.0)
NEUT#: 1.5 10*3/uL (ref 1.5–6.5)
NEUT%: 57.7 % (ref 38.4–76.8)
Platelets: 69 10*3/uL — ABNORMAL LOW (ref 145–400)
RBC: 3.32 10*6/uL — ABNORMAL LOW (ref 3.70–5.45)
RDW: 17.7 % — AB (ref 11.2–14.5)
WBC: 2.7 10*3/uL — ABNORMAL LOW (ref 3.9–10.3)
lymph#: 0.9 10*3/uL (ref 0.9–3.3)

## 2017-08-15 LAB — LACTATE DEHYDROGENASE: LDH: 146 U/L (ref 125–245)

## 2017-08-15 NOTE — Assessment & Plan Note (Signed)
Most likely related to splenic sequestration versus decreased production of the result of prior alcohol abuse. Her platelet count has improved compared to previous levels. I suspect that that is related to decrease in alcohol use. Ultrasound of the liver and spleen will be ordered.

## 2017-08-15 NOTE — Telephone Encounter (Signed)
Printed avs and calender for upcoming appointment. Per 9/25 los

## 2017-08-15 NOTE — Assessment & Plan Note (Signed)
Pancytopenia multifactorial etiology Leukopenia: Primarily neutropenia Differential diagnosis: 1. Medications 2. viral illnesses 3. Autoimmune conditions like lupus 4. J-15 or folic acid deficiencies 5. Bone marrow disorders 6. Cyclical neutropenia 7. Ethnicity related    Recommendation: 1. Recheck CBC with differential 2. N-53 and folic acid levels 3. ANA 4. Flow cytometry  If she has persistent leukopenia and all above tests are normal then we may have to perform a bone marrow biopsy.

## 2017-08-15 NOTE — Progress Notes (Signed)
[facility] CONSULT NOTE  Patient Care Team: Adams-Doolittle, [name], MD as PCP - General (Family Medicine) Leandrew Koyanagi, MD (Inactive) (Family Medicine)  CHIEF COMPLAINTS/PURPOSE OF CONSULTATION:  Pancytopenia  HISTORY OF PRESENTING ILLNESS:  Sara [name] 41 y.o. female is here because of recent diagnosis of pancytopenia. Patient was sent to [place] for evaluation of chronic pancytopenia. Patient had long-standing history of thrombocytopenia going back to at least 2014. This is most likely related to cirrhosis of the liver and portal hypertension. For the course of several years of platelet function had went up and down. Most recent platelet count was 76. This is much better than before. Seen previously. She was also noted to have file mild anemia. Her baseline hemoglobin is 10 g. Patient denies any fevers or chills. Denies any nausea or vomiting.  I reviewed her records extensively and collaborated the history with the patient.  MEDICAL HISTORY:  Past Medical History:  Diagnosis Date  . Alcoholism ([place])   . Anemia   . Anxiety   . Blood transfusion without reported diagnosis   . Cirrhosis ([place])   . Depression   . 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 GERD (gastroesophageal reflux disease)   . Heart murmur    Patient states she may have  . Menorrhagia   . Pancytopenia ([place]) 01/15/2014  . Pneumonia   . Portal hypertension ([place])   . S/P alcohol detoxification    2-3 days at behavioral health previously  . UGI bleed 06/12/2014    SURGICAL HISTORY: Past Surgical History:  Procedure Laterality Date  . CHOLECYSTECTOMY    . ESOPHAGOGASTRODUODENOSCOPY N/A 06/12/2014   Procedure: ESOPHAGOGASTRODUODENOSCOPY (EGD);  Surgeon: Gatha Mayer, MD;  Location: [facility];  Service: Endoscopy;  Laterality: N/A;  . ESOPHAGOGASTRODUODENOSCOPY (EGD) WITH PROPOFOL N/A 07/29/2014   Procedure: ESOPHAGOGASTRODUODENOSCOPY (EGD) WITH PROPOFOL;  Surgeon: Inda Castle, MD;  Location: [facility];  Service: Endoscopy;  Laterality: N/A;    SOCIAL HISTORY: Social History   Social History  . Marital status: Married    Spouse name: [name]  . Number of children: 2  . Years of education: Associates   Occupational History  . Paralegal    Social History Main Topics  . Smoking status: Former Smoker    Packs/day: 0.25    Years: 0.50    Types: Cigarettes  . Smokeless tobacco: Never Used  . Alcohol use 0.0 oz/week     Comment: Usually drinks 2-3 bottles of wine daily when drinking.   . Drug use: No     Comment: 11/05/14 - pt states she did this approx. March 2014  . Sexual activity: Not on file   Other Topics Concern  . Not on file   Social History Narrative   Lives with husband and son. Daughter lives nearby. 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    FAMILY HISTORY: Family History  Problem Relation Age of Onset  . Colon polyps Mother   . Hypertension Mother   . Thyroid disease Mother   . Alcoholism Mother   . Alcoholism Father   . Alcohol abuse Maternal Grandfather   . Alcohol abuse Paternal Grandfather   . Alcohol abuse Paternal Aunt   . Alcohol abuse Maternal Uncle     ALLERGIES:  is allergic to morphine and related; morphine and related; and nsaids.  MEDICATIONS:  Current Outpatient Prescriptions  Medication Sig Dispense Refill  . acamprosate (CAMPRAL) 333 MG tablet Take 2 tablets (666 mg total) by  mouth 3 (three) times daily with meals. For alcoholism 180 tablet 0  . escitalopram (LEXAPRO) 5 MG tablet Take 1 tablet (5 mg total) by mouth daily. For depression 30 tablet 0  . folic acid (FOLVITE) 1 MG tablet Take 1 tablet (1 mg total) by mouth daily. (May purchase from over the counter at the pharmacy): For Folic acid replacement.    . QUEtiapine (SEROQUEL) 25 MG tablet Take 1 tablet (25 mg total) by mouth at bedtime as needed (insomnia). (Mood control/sleep) 30 tablet 0  . topiramate (TOPAMAX) 25 MG  tablet Take 1 tablet (25 mg total) by mouth 2 (two) times daily. For mood stabilization 60 tablet 0   No current facility-administered medications for this visit.     REVIEW OF SYSTEMS:   Constitutional: Denies fevers, chills or abnormal night sweats Eyes: Denies blurriness of vision, double vision or watery eyes Ears, nose, mouth, throat, and face: Denies mucositis or sore throat Respiratory: Denies cough, dyspnea or wheezes Cardiovascular: Denies palpitation, chest discomfort or lower extremity swelling Gastrointestinal:  Denies nausea, heartburn or change in bowel habits Skin: Denies abnormal skin rashes Lymphatics: Denies new lymphadenopathy or easy bruising Neurological:Denies numbness, tingling or new weaknesses Behavioral/Psych: Mood is stable, no new changes  Breast:  Denies any palpable lumps or discharge All other systems were reviewed with the patient and are negative.  PHYSICAL EXAMINATION: ECOG PERFORMANCE STATUS: 1 - Symptomatic but completely ambulatory  Vitals:   08/15/17 1256  BP: (!) 104/59  Pulse: 65  Resp: 18  Temp: 98.5 F (36.9 C)  SpO2: 100%   Filed Weights   08/15/17 1256  Weight: 151 lb 1.6 oz (68.5 kg)    GENERAL:alert, no distress and comfortable SKIN: skin color, texture, turgor are normal, no rashes or significant lesions EYES: normal, conjunctiva are pink and non-injected, sclera clear OROPHARYNX:no exudate, no erythema and lips, buccal mucosa, and tongue normal  NECK: supple, thyroid normal size, non-tender, without nodularity LYMPH:  no palpable lymphadenopathy in the cervical, axillary or inguinal LUNGS: clear to auscultation and percussion with normal breathing effort HEART: regular rate & rhythm and no murmurs and no lower extremity edema ABDOMEN:abdomen soft, non-tender and normal bowel sounds Musculoskeletal:no cyanosis of digits and no clubbing  PSYCH: alert & oriented x 3 with fluent speech NEURO: no focal motor/sensory  deficits BREAST: No palpable nodules in breast. No palpable axillary or supraclavicular lymphadenopathy (exam performed in the presence of a chaperone)   LABORATORY DATA:  I have reviewed the data as listed Lab Results  Component Value Date   WBC 4.2 05/01/2017   HGB 10.6 (L) 05/01/2017   HCT 33.7 (L) 05/01/2017   MCV 89.6 05/01/2017   PLT 54 (L) 05/01/2017   Lab Results  Component Value Date   NA 146 (H) 05/01/2017   K 3.8 05/01/2017   CL 114 (H) 05/01/2017   CO2 26 05/01/2017    RADIOGRAPHIC STUDIES: I have personally reviewed the radiological reports and agreed with the findings in the report.  ASSESSMENT AND PLAN:  Pancytopenia Pancytopenia multifactorial etiology Leukopenia: Primarily neutropenia Differential diagnosis: 1. Medications 2. viral illnesses 3. Autoimmune conditions like lupus 4. E-33 or folic acid deficiencies 5. Bone marrow disorders 6. Cyclical neutropenia 7. Ethnicity related    Recommendation: 1. Recheck CBC with differential 2. I-95 and folic acid levels 3. ANA 4. Flow cytometry  If she has persistent leukopenia and all above tests are normal then we may have to perform a bone marrow biopsy.  Thrombocytopenia Most likely related to splenic sequestration versus decreased production of the result of prior alcohol abuse. Her platelet count has improved compared to previous levels. I suspect that that is related to decrease in alcohol use. Ultrasound of the liver and spleen will be ordered.   All questions were answered. The patient knows to call the clinic with any problems, questions or concerns.    Rulon Eisenmenger, MD 08/15/17

## 2017-08-16 ENCOUNTER — Other Ambulatory Visit: Payer: Self-pay | Admitting: Hematology and Oncology

## 2017-08-16 DIAGNOSIS — D500 Iron deficiency anemia secondary to blood loss (chronic): Secondary | ICD-10-CM

## 2017-08-16 LAB — IRON AND TIBC
%SAT: 6 % — ABNORMAL LOW (ref 21–57)
IRON: 29 ug/dL — AB (ref 41–142)
TIBC: 510 ug/dL — ABNORMAL HIGH (ref 236–444)
UIBC: 481 ug/dL — ABNORMAL HIGH (ref 120–384)

## 2017-08-16 LAB — FERRITIN: Ferritin: 9 ng/ml (ref 9–269)

## 2017-08-16 LAB — FOLATE

## 2017-08-16 LAB — ANTINUCLEAR ANTIBODIES, IFA: ANA Titer 1: NEGATIVE

## 2017-08-16 LAB — VITAMIN B12: Vitamin B12: 1098 pg/mL (ref 232–1245)

## 2017-08-17 ENCOUNTER — Telehealth: Payer: Self-pay | Admitting: Hematology and Oncology

## 2017-08-17 LAB — FLOW CYTOMETRY

## 2017-08-17 NOTE — Telephone Encounter (Signed)
Attempted to tell patient about her appts but her phone was not acting correctly. I am sending her a confirmation in the mail. Added per 9/26 sch msg.

## 2017-08-21 ENCOUNTER — Other Ambulatory Visit: Payer: Self-pay

## 2017-08-21 DIAGNOSIS — D649 Anemia, unspecified: Secondary | ICD-10-CM

## 2017-08-22 ENCOUNTER — Other Ambulatory Visit (HOSPITAL_BASED_OUTPATIENT_CLINIC_OR_DEPARTMENT_OTHER): Payer: Self-pay

## 2017-08-22 ENCOUNTER — Telehealth: Payer: Self-pay

## 2017-08-22 ENCOUNTER — Ambulatory Visit (HOSPITAL_BASED_OUTPATIENT_CLINIC_OR_DEPARTMENT_OTHER): Payer: Self-pay

## 2017-08-22 ENCOUNTER — Ambulatory Visit (HOSPITAL_BASED_OUTPATIENT_CLINIC_OR_DEPARTMENT_OTHER): Payer: Self-pay | Admitting: Hematology and Oncology

## 2017-08-22 ENCOUNTER — Encounter: Payer: Self-pay | Admitting: Hematology and Oncology

## 2017-08-22 VITALS — BP 106/54 | HR 82 | Temp 98.2°F | Resp 18 | Ht 64.0 in | Wt 150.7 lb

## 2017-08-22 VITALS — BP 94/54 | HR 67 | Temp 98.5°F | Resp 18

## 2017-08-22 DIAGNOSIS — D649 Anemia, unspecified: Secondary | ICD-10-CM

## 2017-08-22 DIAGNOSIS — F1029 Alcohol dependence with unspecified alcohol-induced disorder: Secondary | ICD-10-CM

## 2017-08-22 DIAGNOSIS — D708 Other neutropenia: Secondary | ICD-10-CM

## 2017-08-22 DIAGNOSIS — D500 Iron deficiency anemia secondary to blood loss (chronic): Secondary | ICD-10-CM

## 2017-08-22 DIAGNOSIS — D61818 Other pancytopenia: Secondary | ICD-10-CM

## 2017-08-22 LAB — CBC WITH DIFFERENTIAL/PLATELET
BASO%: 0 % (ref 0.0–2.0)
Basophils Absolute: 0 10*3/uL (ref 0.0–0.1)
EOS%: 1 % (ref 0.0–7.0)
Eosinophils Absolute: 0 10*3/uL (ref 0.0–0.5)
HEMATOCRIT: 27.5 % — AB (ref 34.8–46.6)
HEMOGLOBIN: 9.1 g/dL — AB (ref 11.6–15.9)
LYMPH#: 0.8 10*3/uL — AB (ref 0.9–3.3)
LYMPH%: 36.6 % (ref 14.0–49.7)
MCH: 29.4 pg (ref 25.1–34.0)
MCHC: 33.1 g/dL (ref 31.5–36.0)
MCV: 88.7 fL (ref 79.5–101.0)
MONO#: 0.2 10*3/uL (ref 0.1–0.9)
MONO%: 7.3 % (ref 0.0–14.0)
NEUT%: 55.1 % (ref 38.4–76.8)
NEUTROS ABS: 1.1 10*3/uL — AB (ref 1.5–6.5)
Platelets: 51 10*3/uL — ABNORMAL LOW (ref 145–400)
RBC: 3.1 10*6/uL — ABNORMAL LOW (ref 3.70–5.45)
RDW: 15.7 % — ABNORMAL HIGH (ref 11.2–14.5)
WBC: 2.1 10*3/uL — AB (ref 3.9–10.3)

## 2017-08-22 MED ORDER — FERUMOXYTOL INJECTION 510 MG/17 ML
510.0000 mg | Freq: Once | INTRAVENOUS | Status: AC
  Administered 2017-08-22: 510 mg via INTRAVENOUS
  Filled 2017-08-22: qty 17

## 2017-08-22 MED ORDER — SODIUM CHLORIDE 0.9 % IV SOLN
Freq: Once | INTRAVENOUS | Status: AC
  Administered 2017-08-22: 15:00:00 via INTRAVENOUS

## 2017-08-22 NOTE — Progress Notes (Signed)
1632: Pt tolerated infusion well. Pt monitored 30 minutes post infusion. Pt and VS stable at discharge.

## 2017-08-22 NOTE — Telephone Encounter (Signed)
Printed avs and calender for upcoming appointment. Per 10/2 los

## 2017-08-22 NOTE — Assessment & Plan Note (Signed)
Pancytopenia multifactorial etiology Leukopenia: Primarily neutropenia  Workup: 1. Recheck CBC with differential 08/15/2017: WBC 2.7, ANC 1.5, hemoglobin 10, platelets 69 2. H-57 and folic acid levels: Normal 3. ANA: Negative 4. Flow cytometry: No monoclonal B or T-cell aberrant populations identified 5. Iron studies iron saturation 6%, TIBC 510, ferritin 95.  Recommendation: 1. IV iron infusions 2. recheck blood work in one month and conduct bone marrow biopsy if necessary

## 2017-08-22 NOTE — Progress Notes (Signed)
Patient Care Team: Adams-Doolittle, [name], MD (Inactive) as PCP - General (Family Medicine) Leandrew Koyanagi, MD (Inactive) (Family Medicine)  DIAGNOSIS:  Encounter Diagnoses  Name Primary?  . Alcohol dependence with unspecified alcohol-induced disorder ([place]) Yes  . Pancytopenia ([place])    CHIEF COMPLIANT: Follow-up to evaluate pancytopenia and to review the blood work results  INTERVAL HISTORY: Sara [name] is a 41 year old with above-mentioned history of alcoholism related cirrhosis and pancytopenia. We performed extensive blood work and she is here today to discuss the results. On the blood work she was noted to be profoundly iron deficient. The remained low blood work was negative in terms of flow cytometry, P-53 or folic acid and ANA. She feels extremely fatigued and occasionally dizzy and weak.  REVIEW OF SYSTEMS:   Constitutional: Denies fevers, chills or abnormal weight loss Eyes: Denies blurriness of vision Ears, nose, mouth, throat, and face: Denies mucositis or sore throat Respiratory: Denies cough, dyspnea or wheezes Cardiovascular: Denies palpitation, chest discomfort Gastrointestinal:  Denies nausea, heartburn or change in bowel habits Skin: Denies abnormal skin rashes Lymphatics: Denies new lymphadenopathy or easy bruising Neurological:Denies numbness, tingling or new weaknesses Behavioral/Psych: Mood is stable, no new changes  Extremities: No lower extremity edema All other systems were reviewed with the patient and are negative.  I have reviewed the past medical history, past surgical history, social history and family history with the patient and they are unchanged from previous note.  ALLERGIES:  is allergic to morphine and related; morphine and related; and nsaids.  MEDICATIONS:  Current Outpatient Prescriptions  Medication Sig Dispense Refill  . acamprosate (CAMPRAL) 333 MG tablet Take 2 tablets (666 mg total) by mouth 3 (three) times daily with meals.  For alcoholism 180 tablet 0  . escitalopram (LEXAPRO) 5 MG tablet Take 1 tablet (5 mg total) by mouth daily. For depression 30 tablet 0  . folic acid (FOLVITE) 1 MG tablet Take 1 tablet (1 mg total) by mouth daily. (May purchase from over the counter at the pharmacy): For Folic acid replacement.    . QUEtiapine (SEROQUEL) 25 MG tablet Take 1 tablet (25 mg total) by mouth at bedtime as needed (insomnia). (Mood control/sleep) 30 tablet 0  . topiramate (TOPAMAX) 25 MG tablet Take 1 tablet (25 mg total) by mouth 2 (two) times daily. For mood stabilization 60 tablet 0   No current facility-administered medications for this visit.     PHYSICAL EXAMINATION: ECOG PERFORMANCE STATUS: 1 - Symptomatic but completely ambulatory  Vitals:   08/22/17 1335  BP: (!) 106/54  Pulse: 82  Resp: 18  Temp: 98.2 F (36.8 C)  SpO2: 100%   Filed Weights   08/22/17 1335  Weight: 150 lb 11.2 oz (68.4 kg)    GENERAL:alert, no distress and comfortable SKIN: skin color, texture, turgor are normal, no rashes or significant lesions EYES: normal, Conjunctiva are pink and non-injected, sclera clear OROPHARYNX:no exudate, no erythema and lips, buccal mucosa, and tongue normal  NECK: supple, thyroid normal size, non-tender, without nodularity LYMPH:  no palpable lymphadenopathy in the cervical, axillary or inguinal LUNGS: clear to auscultation and percussion with normal breathing effort HEART: regular rate & rhythm and no murmurs and no lower extremity edema ABDOMEN:abdomen soft, non-tender and normal bowel sounds MUSCULOSKELETAL:no cyanosis of digits and no clubbing  NEURO: alert & oriented x 3 with fluent speech, no focal motor/sensory deficits EXTREMITIES: No lower extremity edema  LABORATORY DATA:  I have reviewed the data as listed   Chemistry  Component Value Date/Time   NA 146 (H) 05/01/2017 1830   K 3.8 05/01/2017 1830   CL 114 (H) 05/01/2017 1830   CO2 26 05/01/2017 1830   BUN <5 (L)  05/01/2017 1830   CREATININE 0.36 (L) 05/01/2017 1830   CREATININE 0.47 (L) 08/20/2013 1431      Component Value Date/Time   CALCIUM 8.5 (L) 05/01/2017 1830   ALKPHOS 101 05/01/2017 1830   AST 119 (H) 05/01/2017 1830   ALT 32 05/01/2017 1830   BILITOT 1.4 (H) 05/01/2017 1830       Lab Results  Component Value Date   WBC 2.1 (L) 08/22/2017   HGB 9.1 (L) 08/22/2017   HCT 27.5 (L) 08/22/2017   MCV 88.7 08/22/2017   PLT 51 (L) 08/22/2017   NEUTROABS 1.1 (L) 08/22/2017    ASSESSMENT & PLAN:  Pancytopenia Pancytopenia multifactorial etiology Leukopenia: Primarily neutropenia  Workup: 1. Recheck CBC with differential 08/15/2017: WBC 2.7, ANC 1.5, hemoglobin 10, platelets 69 2. X-93 and folic acid levels: Normal 3. ANA: Negative 4. Flow cytometry: No monoclonal B or T-cell aberrant populations identified 5. Iron studies iron saturation 6%, TIBC 510, ferritin 95.  Recommendation: 1. IV iron infusions 2. recheck blood work in 3 months and conduct bone marrow biopsy if necessary   I spent 25 minutes talking to the patient of which more than half was spent in counseling and coordination of care.  No orders of the defined types were placed in this encounter.  The patient has a good understanding of the overall plan. she agrees with it. she will call with any problems that may develop before the next visit here.   Rulon Eisenmenger, MD 08/22/17

## 2017-08-22 NOTE — Patient Instructions (Signed)

## 2017-08-29 ENCOUNTER — Ambulatory Visit (HOSPITAL_BASED_OUTPATIENT_CLINIC_OR_DEPARTMENT_OTHER): Payer: Self-pay

## 2017-08-29 VITALS — BP 95/60 | HR 69 | Temp 98.8°F | Resp 16

## 2017-08-29 DIAGNOSIS — D500 Iron deficiency anemia secondary to blood loss (chronic): Secondary | ICD-10-CM

## 2017-08-29 MED ORDER — SODIUM CHLORIDE 0.9 % IV SOLN
510.0000 mg | Freq: Once | INTRAVENOUS | Status: AC
  Administered 2017-08-29: 510 mg via INTRAVENOUS
  Filled 2017-08-29: qty 17

## 2017-08-29 MED ORDER — SODIUM CHLORIDE 0.9 % IV SOLN
Freq: Once | INTRAVENOUS | Status: AC
  Administered 2017-08-29: 15:00:00 via INTRAVENOUS

## 2017-08-29 NOTE — Patient Instructions (Signed)

## 2017-08-30 ENCOUNTER — Ambulatory Visit (HOSPITAL_COMMUNITY)
Admission: RE | Admit: 2017-08-30 | Discharge: 2017-08-30 | Disposition: A | Discharge status: Home / Self Care | Payer: Self-pay | Source: Ambulatory Visit | Attending: Hematology and Oncology | Admitting: Hematology and Oncology

## 2017-08-30 DIAGNOSIS — R161 Splenomegaly, not elsewhere classified: Secondary | ICD-10-CM | POA: Insufficient documentation

## 2017-08-30 DIAGNOSIS — K7460 Unspecified cirrhosis of liver: Secondary | ICD-10-CM | POA: Insufficient documentation

## 2017-08-30 DIAGNOSIS — D649 Anemia, unspecified: Secondary | ICD-10-CM | POA: Insufficient documentation

## 2017-08-30 DIAGNOSIS — D696 Thrombocytopenia, unspecified: Secondary | ICD-10-CM | POA: Insufficient documentation

## 2017-09-11 ENCOUNTER — Emergency Department (HOSPITAL_COMMUNITY)
Admission: EM | Admit: 2017-09-11 | Discharge: 2017-09-11 | Disposition: A | Discharge status: Home / Self Care | Payer: Self-pay | Attending: Emergency Medicine | Admitting: Emergency Medicine

## 2017-09-11 ENCOUNTER — Encounter (HOSPITAL_COMMUNITY): Payer: Self-pay | Admitting: Internal Medicine

## 2017-09-11 DIAGNOSIS — Z79899 Other long term (current) drug therapy: Secondary | ICD-10-CM | POA: Insufficient documentation

## 2017-09-11 DIAGNOSIS — F10920 Alcohol use, unspecified with intoxication, uncomplicated: Secondary | ICD-10-CM | POA: Insufficient documentation

## 2017-09-11 DIAGNOSIS — Z87891 Personal history of nicotine dependence: Secondary | ICD-10-CM | POA: Insufficient documentation

## 2017-09-11 LAB — COMPREHENSIVE METABOLIC PANEL
ALK PHOS: 110 U/L (ref 38–126)
ALT: 50 U/L (ref 14–54)
AST: 82 U/L — AB (ref 15–41)
Albumin: 3.4 g/dL — ABNORMAL LOW (ref 3.5–5.0)
Anion gap: 11 (ref 5–15)
BILIRUBIN TOTAL: 1.4 mg/dL — AB (ref 0.3–1.2)
CALCIUM: 8.3 mg/dL — AB (ref 8.9–10.3)
CHLORIDE: 109 mmol/L (ref 101–111)
CO2: 24 mmol/L (ref 22–32)
CREATININE: 0.44 mg/dL (ref 0.44–1.00)
GFR calc Af Amer: >60 mL/min (ref >60)
Glucose, Bld: 113 mg/dL — ABNORMAL HIGH (ref 65–99)
Potassium: 3.3 mmol/L — ABNORMAL LOW (ref 3.5–5.1)
Sodium: 144 mmol/L (ref 135–145)
Total Protein: 7.1 g/dL (ref 6.5–8.1)

## 2017-09-11 LAB — CBC WITH DIFFERENTIAL/PLATELET
BASOS ABS: 0 10*3/uL (ref 0.0–0.1)
Basophils Relative: 1 %
Eosinophils Absolute: 0 10*3/uL (ref 0.0–0.7)
Eosinophils Relative: 0 %
HEMATOCRIT: 32 % — AB (ref 36.0–46.0)
Hemoglobin: 10.9 g/dL — ABNORMAL LOW (ref 12.0–15.0)
LYMPHS ABS: 1.5 10*3/uL (ref 0.7–4.0)
Lymphocytes Relative: 35 %
MCH: 31.3 pg (ref 26.0–34.0)
MCHC: 34.1 g/dL (ref 30.0–36.0)
MCV: 92 fL (ref 78.0–100.0)
MONO ABS: 0.2 10*3/uL (ref 0.1–1.0)
MONOS PCT: 5 %
NEUTROS ABS: 2.5 10*3/uL (ref 1.7–7.7)
Neutrophils Relative %: 59 %
Platelets: 56 10*3/uL — ABNORMAL LOW (ref 150–400)
RBC: 3.48 MIL/uL — ABNORMAL LOW (ref 3.87–5.11)
RDW: 18.7 % — AB (ref 11.5–15.5)
WBC: 4.2 10*3/uL (ref 4.0–10.5)

## 2017-09-11 LAB — RAPID URINE DRUG SCREEN, HOSP PERFORMED
Amphetamines: NOT DETECTED
BARBITURATES: NOT DETECTED
Benzodiazepines: NOT DETECTED
Cocaine: NOT DETECTED
Opiates: NOT DETECTED
TETRAHYDROCANNABINOL: NOT DETECTED

## 2017-09-11 LAB — I-STAT BETA HCG BLOOD, ED (MC, WL, AP ONLY): I-stat hCG, quantitative: <5 m[IU]/mL (ref <5)

## 2017-09-11 LAB — ACETAMINOPHEN LEVEL

## 2017-09-11 LAB — ETHANOL: ALCOHOL ETHYL (B): 455 mg/dL — AB (ref <10)

## 2017-09-11 LAB — SALICYLATE LEVEL: Salicylate Lvl: <7 mg/dL (ref 2.8–30.0)

## 2017-09-11 MED ORDER — VITAMIN B-1 100 MG PO TABS: 100.0000 mg | ORAL_TABLET | Freq: Every day | ORAL | 0 refills | Status: DC

## 2017-09-11 MED ORDER — LORAZEPAM 1 MG PO TABS
1.0000 mg | ORAL_TABLET | Freq: Once | ORAL | Status: AC
  Administered 2017-09-11: 1 mg via ORAL
  Filled 2017-09-11: qty 1

## 2017-09-11 MED ORDER — CHLORDIAZEPOXIDE HCL 25 MG PO CAPS: ORAL_CAPSULE | ORAL | 0 refills | Status: DC

## 2017-09-11 MED ORDER — FOLIC ACID 1 MG PO TABS: 1.0000 mg | ORAL_TABLET | Freq: Every day | ORAL | 0 refills | Status: DC
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
## 2017-09-11 NOTE — ED Notes (Addendum)
Pt agitated and trying to get out of bed. Gave patient phone to call husband to go home. Dr. Thurnell Garbe made aware.

## 2017-09-11 NOTE — ED Notes (Signed)
ETOH 455

## 2017-09-11 NOTE — Discharge Instructions (Signed)
I have given you a prescription for Librium.  It is very important that you do not drink alcohol with this medication, as doing so can lead to death and severe disability.  This medication is to help prevent alcohol withdrawal.  Call 1 of the numbers provided to set up rehab.

## 2017-09-11 NOTE — ED Notes (Signed)
Bed: [facility] Expected date:  Expected time:  Means of arrival:  Comments: 

## 2017-09-11 NOTE — ED Notes (Addendum)
Pt requested to use the phone and call her husband. Phone was given to her and pt was asked if she contacted him and pt stated yes.

## 2017-09-11 NOTE — ED Triage Notes (Signed)
Pt arrived to [facility] via [facility] with alcohol intoxication from home. Husband says that she's out of her psych meds.

## 2017-09-12 NOTE — ED Provider Notes (Signed)
Pt now awake, alert, and in NAD. She is tolerating PO. Encouraged potassium intake. She is tearful on exam and admits that she recently relapsed after several months off EtOH. No SI, HI, or AVH. She is seeking help. We discussed outpt management. Pt adamant that she will no longer drink, is going to seek rehab tomorrow. We discussed risks, benefits of librium taper. Pt has h/o withdrawals. We discussed that drinking while on Librium is life threatening. She would like to detox, will give brief taper, thiamine/folate, outpt resources. No SI, HI, AVH.   Duffy Bruce, MD 09/12/17 [phone]

## 2017-09-16 ENCOUNTER — Encounter (HOSPITAL_COMMUNITY): Payer: Self-pay | Admitting: Emergency Medicine

## 2017-09-16 ENCOUNTER — Emergency Department (HOSPITAL_COMMUNITY)
Admission: EM | Admit: 2017-09-16 | Discharge: 2017-09-17 | Disposition: A | Discharge status: Home / Self Care | Payer: Self-pay | Attending: Emergency Medicine | Admitting: Emergency Medicine

## 2017-09-16 DIAGNOSIS — Z87891 Personal history of nicotine dependence: Secondary | ICD-10-CM | POA: Insufficient documentation

## 2017-09-16 DIAGNOSIS — F329 Major depressive disorder, single episode, unspecified: Secondary | ICD-10-CM | POA: Insufficient documentation

## 2017-09-16 DIAGNOSIS — Z79899 Other long term (current) drug therapy: Secondary | ICD-10-CM | POA: Insufficient documentation

## 2017-09-16 DIAGNOSIS — F1024 Alcohol dependence with alcohol-induced mood disorder: Secondary | ICD-10-CM | POA: Insufficient documentation

## 2017-09-16 DIAGNOSIS — R45851 Suicidal ideations: Secondary | ICD-10-CM | POA: Insufficient documentation

## 2017-09-16 LAB — CBC
HCT: 36.8 % (ref 36.0–46.0)
HEMOGLOBIN: 12.7 g/dL (ref 12.0–15.0)
MCH: 32.2 pg (ref 26.0–34.0)
MCHC: 34.5 g/dL (ref 30.0–36.0)
MCV: 93.4 fL (ref 78.0–100.0)
Platelets: 41 10*3/uL — ABNORMAL LOW (ref 150–400)
RBC: 3.94 MIL/uL (ref 3.87–5.11)
RDW: 19.4 % — ABNORMAL HIGH (ref 11.5–15.5)
WBC: 3.7 10*3/uL — AB (ref 4.0–10.5)

## 2017-09-16 LAB — COMPREHENSIVE METABOLIC PANEL
ALBUMIN: 3.8 g/dL (ref 3.5–5.0)
ALK PHOS: 115 U/L (ref 38–126)
ALT: 51 U/L (ref 14–54)
AST: 100 U/L — AB (ref 15–41)
Anion gap: 12 (ref 5–15)
BUN: 5 mg/dL — AB (ref 6–20)
CO2: 24 mmol/L (ref 22–32)
CREATININE: 0.36 mg/dL — AB (ref 0.44–1.00)
Calcium: 8.8 mg/dL — ABNORMAL LOW (ref 8.9–10.3)
Chloride: 107 mmol/L (ref 101–111)
GFR calc non Af Amer: >60 mL/min (ref >60)
GLUCOSE: 96 mg/dL (ref 65–99)
Potassium: 4 mmol/L (ref 3.5–5.1)
SODIUM: 143 mmol/L (ref 135–145)
Total Bilirubin: 1.7 mg/dL — ABNORMAL HIGH (ref 0.3–1.2)
Total Protein: 8.4 g/dL — ABNORMAL HIGH (ref 6.5–8.1)

## 2017-09-16 LAB — RAPID URINE DRUG SCREEN, HOSP PERFORMED
AMPHETAMINES: NOT DETECTED
BENZODIAZEPINES: POSITIVE — AB
Barbiturates: NOT DETECTED
Cocaine: NOT DETECTED
OPIATES: NOT DETECTED
TETRAHYDROCANNABINOL: NOT DETECTED

## 2017-09-16 LAB — ETHANOL: Alcohol, Ethyl (B): 290 mg/dL — ABNORMAL HIGH (ref <10)

## 2017-09-16 LAB — ACETAMINOPHEN LEVEL: Acetaminophen (Tylenol), Serum: <10 ug/mL — ABNORMAL LOW (ref 10–30)

## 2017-09-16 LAB — SALICYLATE LEVEL

## 2017-09-16 LAB — HCG, QUANTITATIVE, PREGNANCY: hCG, Beta Chain, Quant, S: <1 m[IU]/mL (ref <5)

## 2017-09-16 MED ORDER — VITAMIN B-1 100 MG PO TABS
100.0000 mg | ORAL_TABLET | Freq: Every day | ORAL | Status: DC
  Administered 2017-09-16 – 2017-09-17 (×2): 100 mg via ORAL
  Filled 2017-09-16 (×2): qty 1

## 2017-09-16 MED ORDER — ACETAMINOPHEN 325 MG PO TABS: 650.0000 mg | ORAL_TABLET | ORAL | Status: DC | PRN

## 2017-09-16 MED ORDER — NICOTINE 21 MG/24HR TD PT24
21.0000 mg | MEDICATED_PATCH | Freq: Every day | TRANSDERMAL | Status: DC
  Filled 2017-09-16: qty 1

## 2017-09-16 MED ORDER — ZOLPIDEM TARTRATE 5 MG PO TABS: 5.0000 mg | ORAL_TABLET | Freq: Every evening | ORAL | Status: DC | PRN

## 2017-09-16 MED ORDER — ALUM & MAG HYDROXIDE-SIMETH 200-200-20 MG/5ML PO SUSP: 30.0000 mL | Freq: Four times a day (QID) | ORAL | Status: DC | PRN

## 2017-09-16 MED ORDER — LORAZEPAM 1 MG PO TABS
0.0000 mg | ORAL_TABLET | Freq: Four times a day (QID) | ORAL | Status: DC
Start: 2017-09-16 — End: 2017-09-17
  Administered 2017-09-16: 2 mg via ORAL
  Administered 2017-09-17: 1 mg via ORAL
  Filled 2017-09-16: qty 2
  Filled 2017-09-16: qty 1

## 2017-09-16 MED ORDER — THIAMINE HCL 100 MG/ML IJ SOLN
100.0000 mg | Freq: Every day | INTRAMUSCULAR | Status: DC
  Filled 2017-09-16: qty 2

## 2017-09-16 MED ORDER — LORAZEPAM 2 MG/ML IJ SOLN: 0.0000 mg | Freq: Two times a day (BID) | INTRAMUSCULAR | Status: DC

## 2017-09-16 MED ORDER — LORAZEPAM 1 MG PO TABS: 0.0000 mg | ORAL_TABLET | Freq: Two times a day (BID) | ORAL | Status: DC

## 2017-09-16 MED ORDER — LORAZEPAM 2 MG/ML IJ SOLN: 0.0000 mg | Freq: Four times a day (QID) | INTRAMUSCULAR | Status: DC

## 2017-09-16 MED ORDER — ONDANSETRON 4 MG PO TBDP
4.0000 mg | ORAL_TABLET | Freq: Three times a day (TID) | ORAL | Status: DC | PRN
  Administered 2017-09-16 – 2017-09-17 (×2): 4 mg via ORAL
  Filled 2017-09-16 (×2): qty 1

## 2017-09-16 NOTE — ED Notes (Signed)
SBAR Report received from previous nurse. Pt received calm and visible on unit. Pt denies current SI/ HI, A/V H,  and appears otherwise stable and free of distress. Pt does endorse anxiety, depression, and reports signs of withdrawal. Pt reminded of camera surveillance, q 15 min rounds, and rules of the milieu. Will continue to assess.

## 2017-09-16 NOTE — BH Assessment (Signed)
[facility] Assessment Progress Note  Case was staffed with Reita Cliche DNP who recommended patient be observed and monitored for safety. Patient will be evaluated for medication management and detox protocol.  Patient will be seen in a.m. by psychiatry.

## 2017-09-16 NOTE — ED Triage Notes (Signed)
Per husband-states patient recently relapsed on alcohol over 2 weeks ago-states she was in [facility] over 4 1/2 months ago and place on a combination of meds that controlled her moods as well as ETOH use-states she was sober for that entire time-states PCP would not refill meds and they were not able to get in to see psychiatrist-she has been off meds for over two weeks which has caused relapse-wants admission to [facility] is having to pay out of pocket because he is not working at this time

## 2017-09-16 NOTE — ED Notes (Signed)
Bed: [facility] Expected date:  Expected time:  Means of arrival:  Comments: 

## 2017-09-16 NOTE — ED Notes (Signed)
Bed: [facility]3 Expected date:  Expected time:  Means of arrival:  Comments: 
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
## 2017-09-16 NOTE — Progress Notes (Addendum)
Pt ambulatory to [facility] with a steady gait. A & O X4. Denies HI, AVH and pain. Endorsed depression and SI without a plan at this time. Per pt "I'm here because I'm an alcoholic and I wanted to kill myself". Per pt recent stressors include "I lost my father September last year, I was at [facility] about 4 months ago, they put me on medications, I ran out and the doctor I'm seeing now, refused to refill my psych medicines, so I've been off medicines for a month now".  Pt states "I drank 2 (12 0z) beers this morning around 5:30 because I was shaking" BAL 290 on arrival to ED. Per pt longest sobriety was during pregnancies and recently 4.5 months post d/c from [facility]. Emotional support and availability offered to pt. Encouraged pt to voice concerns. Q 15 minutes safety checks maintained without self harm gestures or outburst to note thus far.
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
## 2017-09-16 NOTE — ED Notes (Signed)
Bed: [facility] Expected date:  Expected time:  Means of arrival:  Comments: 

## 2017-09-17 DIAGNOSIS — Z811 Family history of alcohol abuse and dependence: Secondary | ICD-10-CM

## 2017-09-17 DIAGNOSIS — F1910 Other psychoactive substance abuse, uncomplicated: Secondary | ICD-10-CM

## 2017-09-17 DIAGNOSIS — F1024 Alcohol dependence with alcohol-induced mood disorder: Secondary | ICD-10-CM

## 2017-09-17 DIAGNOSIS — R450 Nervousness: Secondary | ICD-10-CM

## 2017-09-17 DIAGNOSIS — Z87891 Personal history of nicotine dependence: Secondary | ICD-10-CM

## 2017-09-17 DIAGNOSIS — F419 Anxiety disorder, unspecified: Secondary | ICD-10-CM

## 2017-09-17 MED ORDER — GABAPENTIN 300 MG PO CAPS: 300.0000 mg | ORAL_CAPSULE | Freq: Three times a day (TID) | ORAL | Status: DC

## 2017-09-17 MED ORDER — GABAPENTIN 300 MG PO CAPS: 300.0000 mg | ORAL_CAPSULE | Freq: Three times a day (TID) | ORAL | 0 refills | Status: DC

## 2017-09-17 NOTE — [facility] Counselor (Signed)
Provided Pt with appropriate outpatient resources.  Advised also that Peer Support can follow-up with her once she returns home.  Confirmed Pt's contact information:  [address] [place], [place] [address]  Phone [phone]

## 2017-09-17 NOTE — ED Notes (Signed)
Bed: WA27 Expected date:  Expected time:  Means of arrival:  Comments: 
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
## 2017-09-17 NOTE — ED Notes (Signed)
Pt asleep.
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
## 2017-09-20 ENCOUNTER — Telehealth: Payer: Self-pay

## 2017-09-20 NOTE — Telephone Encounter (Signed)
Patient called clinic to advise of lab values reported by [facility]. Patient states current platelets 30/Hgb 12. States she has bruising to bilateral lower extremities but denies active bleeding. Notified Dr. Jana Hakim of the above. Advises to have patient RTC on Friday, 09/22/2017 for repeat blood work. Informed patient of same, and she verbalized understanding. Patient states she will go to nearest ED if any problems arise in the meantime.

## 2017-09-20 NOTE — Telephone Encounter (Signed)
Returned call to [phone] "Marya Amsler unable to confirm this person is here.  Notified this is a return call for a message left for a provider at [facility] earlier this afternoon.  Confirmed patient name and D.O.B. Triage returning call in reference to report of abnormal labs.    ED lab results 09-16-2017, HGB = 12.7, HCT = 36.8, Pltc = 41.    09-11-2017 labs, HGB = 10.9, HCT = 32, Pltc = 56. Confirms a nurse will schedule lab/F/U appointments for this week.  Denies bleeding, admits to bruising.  reviewed bleeding precautions.  No further questions.  Voicemail: "Yesterday I had blood work.  I am under the care of Dr. Lindi Adie but want to talk with him first to see if I need to be admitted to the hospital.  My platelet count = 30 and hemoglobin = 12.  The number I can be reached at is [phone]."

## 2017-09-21 ENCOUNTER — Ambulatory Visit (HOSPITAL_BASED_OUTPATIENT_CLINIC_OR_DEPARTMENT_OTHER): Payer: Self-pay | Admitting: Hematology and Oncology

## 2017-09-21 ENCOUNTER — Ambulatory Visit (HOSPITAL_BASED_OUTPATIENT_CLINIC_OR_DEPARTMENT_OTHER): Payer: Self-pay

## 2017-09-21 ENCOUNTER — Other Ambulatory Visit: Payer: Self-pay

## 2017-09-21 ENCOUNTER — Telehealth: Payer: Self-pay | Admitting: Hematology and Oncology

## 2017-09-21 VITALS — BP 105/65 | HR 69 | Temp 98.2°F | Resp 20 | Ht 64.0 in | Wt 148.3 lb

## 2017-09-21 DIAGNOSIS — D500 Iron deficiency anemia secondary to blood loss (chronic): Secondary | ICD-10-CM

## 2017-09-21 DIAGNOSIS — F1020 Alcohol dependence, uncomplicated: Secondary | ICD-10-CM

## 2017-09-21 DIAGNOSIS — D61818 Other pancytopenia: Secondary | ICD-10-CM

## 2017-09-21 LAB — COMPREHENSIVE METABOLIC PANEL
ALBUMIN: 3.6 g/dL (ref 3.5–5.0)
ALK PHOS: 112 U/L (ref 40–150)
ALT: 34 U/L (ref 0–55)
AST: 54 U/L — ABNORMAL HIGH (ref 5–34)
Anion Gap: 8 mEq/L (ref 3–11)
BILIRUBIN TOTAL: 1.56 mg/dL — AB (ref 0.20–1.20)
BUN: 12.1 mg/dL (ref 7.0–26.0)
CO2: 25 mEq/L (ref 22–29)
Calcium: 9.3 mg/dL (ref 8.4–10.4)
Chloride: 105 mEq/L (ref 98–109)
Creatinine: 0.6 mg/dL (ref 0.6–1.1)
GLUCOSE: 99 mg/dL (ref 70–140)
Potassium: 3.8 mEq/L (ref 3.5–5.1)
SODIUM: 138 meq/L (ref 136–145)
TOTAL PROTEIN: 7.7 g/dL (ref 6.4–8.3)

## 2017-09-21 LAB — CBC WITH DIFFERENTIAL/PLATELET
BASO%: 0.3 % (ref 0.0–2.0)
BASOS ABS: 0 10*3/uL (ref 0.0–0.1)
EOS%: 0.8 % (ref 0.0–7.0)
Eosinophils Absolute: 0 10*3/uL (ref 0.0–0.5)
HCT: 35.2 % (ref 34.8–46.6)
HEMOGLOBIN: 11.8 g/dL (ref 11.6–15.9)
LYMPH#: 0.6 10*3/uL — AB (ref 0.9–3.3)
LYMPH%: 32.9 % (ref 14.0–49.7)
MCH: 32.4 pg (ref 25.1–34.0)
MCHC: 33.6 g/dL (ref 31.5–36.0)
MCV: 96.3 fL (ref 79.5–101.0)
MONO#: 0.2 10*3/uL (ref 0.1–0.9)
MONO%: 10.5 % (ref 0.0–14.0)
NEUT%: 55.5 % (ref 38.4–76.8)
NEUTROS ABS: 1.1 10*3/uL — AB (ref 1.5–6.5)
PLATELETS: 35 10*3/uL — AB (ref 145–400)
RBC: 3.65 10*6/uL — ABNORMAL LOW (ref 3.70–5.45)
RDW: 23.7 % — ABNORMAL HIGH (ref 11.2–14.5)
WBC: 1.9 10*3/uL — ABNORMAL LOW (ref 3.9–10.3)

## 2017-09-21 NOTE — Telephone Encounter (Signed)
Gave patient avs and calendar per 11/1 los. Put hold on Dr. Gudenas schedule for her bone marrow biopsy.  °

## 2017-09-25 ENCOUNTER — Other Ambulatory Visit: Payer: Self-pay

## 2017-09-25 DIAGNOSIS — D696 Thrombocytopenia, unspecified: Secondary | ICD-10-CM

## 2017-09-25 NOTE — Progress Notes (Signed)
Patient Care Team: Adams-Doolittle, [name], MD (Inactive) as PCP - General (Family Medicine) Leandrew Koyanagi, MD (Inactive) (Family Medicine)  DIAGNOSIS:  Encounter Diagnoses  Name Primary?  [name] Uncomplicated alcohol dependence ([place]) Yes  . Pancytopenia ([place])      CHIEF COMPLIANT: Follow-up of pancytopenia  INTERVAL HISTORY: Sara [name] is a 41 year old with the above-mentioned history of pancytopenia who is here to review the recently performed blood work.  She reports that she had a relapse of her alcoholism.  REVIEW OF SYSTEMS:   Constitutional: Denies fevers, chills or abnormal weight loss Eyes: Denies blurriness of vision Ears, nose, mouth, throat, and face: Denies mucositis or sore throat Respiratory: Denies cough, dyspnea or wheezes Cardiovascular: Denies palpitation, chest discomfort Gastrointestinal:  Denies nausea, heartburn or change in bowel habits Skin: Denies abnormal skin rashes Lymphatics: Denies new lymphadenopathy or easy bruising Neurological:Denies numbness, tingling or new weaknesses Behavioral/Psych: Mood is stable, no new changes  Extremities: No lower extremity edema  All other systems were reviewed with the patient and are negative.  I have reviewed the past medical history, past surgical history, social history and family history with the patient and they are unchanged from previous note.  ALLERGIES:  is allergic to morphine and related and nsaids.  MEDICATIONS:  Current Outpatient Medications  Medication Sig Dispense Refill  . chlordiazePOXIDE (LIBRIUM) 25 MG capsule 21m PO TID x 1D, then 25-548mPO BID X 1D, then 25-5033mO QD X 1D (Patient taking differently: Take 25 mg by mouth daily. 26m41m TID x 1D, then 25-26mg82mBID X 1D, then 25-26mg 17mD X 1D) 10 capsule 0  . folic acid (FOLVITE) 1 MG tablet Take 1 tablet (1 mg total) by mouth daily. 30 tablet 0  . gabapentin (NEURONTIN) 300 MG capsule Take 1 capsule (300 mg total) by mouth 3  (three) times daily. 90 capsule 0  . Multiple Vitamin (MULTIVITAMIN WITH MINERALS) TABS tablet Take 1 tablet by mouth daily.    . thiaMarland Kitchenine (VITAMIN B-1) 100 MG tablet Take 1 tablet (100 mg total) by mouth daily. (Patient not taking: Reported on 09/16/2017) 30 tablet 0   No current facility-administered medications for this visit.     PHYSICAL EXAMINATION: ECOG PERFORMANCE STATUS: 2 - Symptomatic, <50% confined to bed  Vitals:   09/21/17 1157  BP: 105/65  Pulse: 69  Resp: 20  Temp: 98.2 F (36.8 C)  SpO2: 100%   Filed Weights   09/21/17 1157  Weight: 148 lb 4.8 oz (67.3 kg)    GENERAL:alert, no distress and comfortable SKIN: skin color, texture, turgor are normal, no rashes or significant lesions EYES: normal, Conjunctiva are pink and non-injected, sclera clear OROPHARYNX:no exudate, no erythema and lips, buccal mucosa, and tongue normal  NECK: supple, thyroid normal size, non-tender, without nodularity LYMPH:  no palpable lymphadenopathy in the cervical, axillary or inguinal LUNGS: clear to auscultation and percussion with normal breathing effort HEART: regular rate & rhythm and no murmurs and no lower extremity edema ABDOMEN:abdomen soft, non-tender and normal bowel sounds MUSCULOSKELETAL:no cyanosis of digits and no clubbing  NEURO: alert & oriented x 3 with fluent speech, no focal motor/sensory deficits EXTREMITIES: No lower extremity edema  LABORATORY DATA:  I have reviewed the data as listed   Chemistry      Component Value Date/Time   NA 138 09/21/2017 1140   K 3.8 09/21/2017 1140   CL 107 09/16/2017 1347   CO2 25 09/21/2017 1140   BUN 12.1 09/21/2017 1140  CREATININE 0.6 09/21/2017 1140      Component Value Date/Time   CALCIUM 9.3 09/21/2017 1140   ALKPHOS 112 09/21/2017 1140   AST 54 (H) 09/21/2017 1140   ALT 34 09/21/2017 1140   BILITOT 1.56 (H) 09/21/2017 1140       Lab Results  Component Value Date   WBC 1.9 (L) 09/21/2017   HGB 11.8  09/21/2017   HCT 35.2 09/21/2017   MCV 96.3 09/21/2017   PLT 35 (L) 09/21/2017   NEUTROABS 1.1 (L) 09/21/2017    ASSESSMENT & PLAN:  Pancytopenia Pancytopenia multifactorial etiology Leukopenia: Primarily neutropenia  Workup: 1. Recheck CBC with differential 08/15/2017: WBC 2.7, ANC 1.5, hemoglobin 10, platelets 69 2. U-03 and folic acid levels: Normal 3. ANA: Negative 4. Flow cytometry: No monoclonal B or T-cell aberrant populations identified 5. Iron studies iron saturation 6%, TIBC 510, ferritin 95. (Given IV iron October 2018)  Recommendation: Bone marrow biopsy to be done on 09/26/2017   I spent 25 minutes talking to the patient of which more than half was spent in counseling and coordination of care.  No orders of the defined types were placed in this encounter.  The patient has a good understanding of the overall plan. she agrees with it. she will call with any problems that may develop before the next visit here.   Rulon Eisenmenger, MD 09/25/17

## 2017-09-25 NOTE — Assessment & Plan Note (Signed)
Pancytopenia multifactorial etiology Leukopenia: Primarily neutropenia  Workup: 1. Recheck CBC with differential 08/15/2017: WBC 2.7, ANC 1.5, hemoglobin 10, platelets 69 2. S-81 and folic acid levels: Normal 3. ANA: Negative 4. Flow cytometry: No monoclonal B or T-cell aberrant populations identified 5. Iron studies iron saturation 6%, TIBC 510, ferritin 95. (Given IV iron October 2018)  Recommendation: Bone marrow biopsy to be done on 09/26/2017

## 2017-09-26 ENCOUNTER — Other Ambulatory Visit (HOSPITAL_COMMUNITY)
Admission: RE | Admit: 2017-09-26 | Discharge: 2017-09-26 | Disposition: A | Discharge status: Home / Self Care | Payer: Self-pay | Source: Ambulatory Visit | Attending: Hematology and Oncology | Admitting: Hematology and Oncology

## 2017-09-26 ENCOUNTER — Telehealth: Payer: Self-pay | Admitting: Hematology and Oncology

## 2017-09-26 ENCOUNTER — Ambulatory Visit (HOSPITAL_BASED_OUTPATIENT_CLINIC_OR_DEPARTMENT_OTHER): Payer: Self-pay | Admitting: Hematology and Oncology

## 2017-09-26 ENCOUNTER — Other Ambulatory Visit (HOSPITAL_BASED_OUTPATIENT_CLINIC_OR_DEPARTMENT_OTHER): Payer: Self-pay

## 2017-09-26 VITALS — BP 95/60 | HR 58 | Temp 97.7°F | Resp 18

## 2017-09-26 DIAGNOSIS — D696 Thrombocytopenia, unspecified: Secondary | ICD-10-CM

## 2017-09-26 DIAGNOSIS — D61810 Antineoplastic chemotherapy induced pancytopenia: Secondary | ICD-10-CM

## 2017-09-26 DIAGNOSIS — D72822 Plasmacytosis: Secondary | ICD-10-CM | POA: Insufficient documentation

## 2017-09-26 DIAGNOSIS — T451X5A Adverse effect of antineoplastic and immunosuppressive drugs, initial encounter: Secondary | ICD-10-CM

## 2017-09-26 DIAGNOSIS — D72819 Decreased white blood cell count, unspecified: Secondary | ICD-10-CM | POA: Insufficient documentation

## 2017-09-26 DIAGNOSIS — D61818 Other pancytopenia: Secondary | ICD-10-CM | POA: Insufficient documentation

## 2017-09-26 LAB — CBC WITH DIFFERENTIAL/PLATELET
BASO%: 0.5 % (ref 0.0–2.0)
Basophils Absolute: 0 10*3/uL (ref 0.0–0.1)
EOS%: 0.7 % (ref 0.0–7.0)
Eosinophils Absolute: 0 10*3/uL (ref 0.0–0.5)
HCT: 36.6 % (ref 34.8–46.6)
HEMOGLOBIN: 12.4 g/dL (ref 11.6–15.9)
LYMPH%: 37.7 % (ref 14.0–49.7)
MCH: 33 pg (ref 25.1–34.0)
MCHC: 33.9 g/dL (ref 31.5–36.0)
MCV: 97.5 fL (ref 79.5–101.0)
MONO#: 0.2 10*3/uL (ref 0.1–0.9)
MONO%: 11.2 % (ref 0.0–14.0)
NEUT%: 49.9 % (ref 38.4–76.8)
NEUTROS ABS: 1 10*3/uL — AB (ref 1.5–6.5)
Platelets: 44 10*3/uL — ABNORMAL LOW (ref 145–400)
RBC: 3.75 10*6/uL (ref 3.70–5.45)
RDW: 22.3 % — AB (ref 11.2–14.5)
WBC: 2 10*3/uL — AB (ref 3.9–10.3)
lymph#: 0.7 10*3/uL — ABNORMAL LOW (ref 0.9–3.3)

## 2017-09-26 NOTE — Telephone Encounter (Signed)
No 1/16 los.  

## 2017-09-26 NOTE — Patient Instructions (Signed)
Bone Marrow Aspiration and Bone Marrow Biopsy, Adult, Care After This sheet gives you information about how to care for yourself after your procedure. Your health care provider may also give you more specific instructions. If you have problems or questions, contact your health care provider. What can I expect after the procedure? After the procedure, it is common to have:  Mild pain and tenderness.  Swelling.  Bruising.  Follow these instructions at home:  Take over-the-counter or prescription medicines only as told by your health care provider.  Do not take baths, swim, or use a hot tub until your health care provider approves. Ask if you can take a shower or have a sponge bath.  Follow instructions from your health care provider about how to take care of the puncture site. Make sure you: ? Wash your hands with soap and water before you change your bandage (dressing). If soap and water are not available, use hand sanitizer. ? Change your dressing as told by your health care provider.  Check your puncture siteevery day for signs of infection. Check for: ? More redness, swelling, or pain. ? More fluid or blood. ? Warmth. ? Pus or a bad smell.  Return to your normal activities as told by your health care provider. Ask your health care provider what activities are safe for you.  Do not drive for 24 hours if you were given a medicine to help you relax (sedative).  Keep all follow-up visits as told by your health care provider. This is important. Contact a health care provider if:  You have more redness, swelling, or pain around the puncture site.  You have more fluid or blood coming from the puncture site.  Your puncture site feels warm to the touch.  You have pus or a bad smell coming from the puncture site.  You have a fever.  Your pain is not controlled with medicine. This information is not intended to replace advice given to you by your health care provider. Make sure  you discuss any questions you have with your health care provider. Document Released: 05/27/2005 Document Revised: 05/27/2016 Document Reviewed: 04/20/2016 Elsevier Interactive Patient Education  2018 [employer].

## 2017-09-26 NOTE — Progress Notes (Addendum)
INDICATION:  Brief examination was performed. ENT: adequate airway clearance Heart: regular rate and rhythm.No Murmurs Lungs: clear to auscultation, no wheezes, normal respiratory effort  Bone Marrow Biopsy and Aspiration Procedure Note   Informed consent was obtained and potential risks including bleeding, infection and pain were reviewed with the patient.  The patient's name, date of birth, identification, consent and allergies were verified prior to the start of procedure and time out was performed.  The right posterior iliac crest was chosen as the site of biopsy.  The skin was prepped with Betadine solution.   8 cc of 2% lidocaine was used to provide local anaesthesia.   10 cc of bone marrow aspirate was obtained followed by 1 inch biopsy.  Pressure was applied to the biopsy site and bandage was placed over the biopsy site. Patient was made to lie on her back for 15-30 mins prior to discharge.  The procedure was tolerated well. COMPLICATIONS: None BLOOD LOSS: none The patient was discharged home in stable condition with a 1 week follow up to review results. She was provided with post bone marrow biopsy instructions and instructed to call if there was any bleeding or worsening pain.  Specimens sent for flow cytometry, cytogenetics and additional studies.  Signed Scot Dock, NP  Attending note: I supervise this bone marrow aspiration biopsy procedure personally from the beginning to the end. Patient tolerated the procedure extremely well. No blood loss and no complications.

## 2017-10-03 ENCOUNTER — Telehealth: Payer: Self-pay | Admitting: Hematology and Oncology

## 2017-10-03 ENCOUNTER — Ambulatory Visit (HOSPITAL_BASED_OUTPATIENT_CLINIC_OR_DEPARTMENT_OTHER): Payer: Self-pay | Admitting: Hematology and Oncology

## 2017-10-03 DIAGNOSIS — D696 Thrombocytopenia, unspecified: Secondary | ICD-10-CM

## 2017-10-03 NOTE — Assessment & Plan Note (Signed)
Pancytopenia multifactorial etiology Leukopenia: Primarily neutropenia  Workup: 1. CBC with differential 08/15/2017: WBC 2.7, ANC 1.5, hemoglobin 10, platelets 69 2. A-41 and folic acid levels: Normal 3. ANA: Negative 4. Flow cytometry: No monoclonal B or T-cell aberrant populations identified 5. Iron studies iron saturation 6%, TIBC 510, ferritin 95. (Given IV iron October 2018)  Recommendation: Bone marrow biopsy on 09/26/2017: Slightly hypercellular (50-70%) bone marrow for age with trilineage hematopoiesis, mild plasmacytosis 4%  The above results indicate that the bone marrow function is preserved. The pancytopenia is directly attributable to her alcoholism and cirrhosis of the liver.

## 2017-10-03 NOTE — Progress Notes (Signed)
Patient Care Team: Adams-Doolittle, [name], MD (Inactive) as PCP - General (Family Medicine) Leandrew Koyanagi, MD (Inactive) (Family Medicine)  DIAGNOSIS:  Encounter Diagnosis  Name Primary?  . 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  She had some bruising after the bone marrow biopsy but otherwise did quite well.  REVIEW OF SYSTEMS:   Constitutional: Denies fevers, chills or abnormal weight loss Eyes: Denies blurriness of vision Ears, nose, mouth, throat, and face: Denies mucositis or sore throat Respiratory: Denies cough, dyspnea or wheezes Cardiovascular: Denies palpitation, chest discomfort Gastrointestinal:  Denies nausea, heartburn or change in bowel habits Skin: Denies abnormal skin rashes Lymphatics: Denies new lymphadenopathy or easy bruising Neurological:Denies numbness, tingling or new weaknesses Behavioral/Psych: Mood is stable, no new changes  Extremities: No lower extremity edema  All other systems were reviewed with the patient and are negative.  I have reviewed the past medical history, past surgical history, social history and family history with the patient and they are unchanged from previous note.  ALLERGIES:  is allergic to morphine and related and nsaids.  MEDICATIONS:  Current Outpatient Medications  Medication Sig Dispense Refill  . gabapentin (NEURONTIN) 300 MG capsule Take 1 capsule (300 mg total) by mouth 3 (three) times daily. 90 capsule 0  . Multiple Vitamin (MULTIVITAMIN WITH MINERALS) TABS tablet Take 1 tablet by mouth daily.    [name] thiamine (VITAMIN B-1) 100 MG tablet Take 1 tablet (100 mg total) by mouth daily. (Patient not taking: Reported on 09/16/2017) 30 tablet 0   No current facility-administered  medications for this visit.     PHYSICAL EXAMINATION: ECOG PERFORMANCE STATUS: 1 - Symptomatic but completely ambulatory  Vitals:   10/03/17 1454  BP: (!) 99/50  Pulse: (!) 56  Resp: 18  Temp: 98.2 F (36.8 C)  SpO2: 100%   Filed Weights   10/03/17 1454  Weight: 151 lb 3.2 oz (68.6 kg)    GENERAL:alert, no distress and comfortable SKIN: skin color, texture, turgor are normal, no rashes or significant lesions EYES: normal, Conjunctiva are pink and non-injected, sclera clear OROPHARYNX:no exudate, no erythema and lips, buccal mucosa, and tongue normal  NECK: supple, thyroid normal size, non-tender, without nodularity LYMPH:  no palpable lymphadenopathy in the cervical, axillary or inguinal LUNGS: clear to auscultation and percussion with normal breathing effort HEART: regular rate & rhythm and no murmurs and no lower extremity edema ABDOMEN:abdomen soft, non-tender and normal bowel sounds MUSCULOSKELETAL:no cyanosis of digits and no clubbing  NEURO: alert & oriented x 3 with fluent speech, no focal motor/sensory deficits EXTREMITIES: No lower extremity edema   LABORATORY DATA:  I have reviewed the data as listed   Chemistry      Component Value Date/Time   NA 138 09/21/2017 1140   K 3.8 09/21/2017 1140   CL 107 09/16/2017 1347   CO2 25 09/21/2017 1140   BUN 12.1 09/21/2017 1140   CREATININE 0.6 09/21/2017 1140      Component Value Date/Time   CALCIUM 9.3 09/21/2017 1140   ALKPHOS 112 09/21/2017 1140   AST 54 (H) 09/21/2017 1140   ALT 34 09/21/2017 1140   BILITOT 1.56 (H) 09/21/2017 1140       Lab Results  Component Value Date   WBC 2.0 (L) 09/26/2017   HGB  12.4 09/26/2017   HCT 36.6 09/26/2017   MCV 97.5 09/26/2017   PLT 44 (L) 09/26/2017   NEUTROABS 1.0 (L) 09/26/2017    ASSESSMENT & PLAN:  Thrombocytopenia Pancytopenia multifactorial etiology Leukopenia: Primarily neutropenia  Workup: 1. CBC with differential 08/15/2017: WBC 2.7, ANC 1.5,  hemoglobin 10, platelets 69 2. F-29 and folic acid levels: Normal 3. ANA: Negative 4. Flow cytometry: No monoclonal B or T-cell aberrant populations identified 5. Iron studies iron saturation 6%, TIBC 510, ferritin 95. (Given IV iron October 2018)  Recommendation: Bone marrow biopsy on 09/26/2017: Slightly hypercellular (50-70%) bone marrow for age with trilineage hematopoiesis, mild plasmacytosis 4%  The above results indicate that the bone marrow function is preserved. The pancytopenia is directly attributable to her alcoholism and cirrhosis of the liver. I discussed with her that if she stops alcohol consumption her blood and bone marrow function will improve. However if she continues to drink then she will become more persistently and profoundly pancytopenic.   I spent 25 minutes talking to the patient of which more than half was spent in counseling and coordination of care.  Orders Placed This Encounter  Procedures  . CBC with Differential    Standing Status:   Future    Standing Expiration Date:   10/03/2018  . Vitamin B12    Standing Status:   Future    Standing Expiration Date:   10/03/2018  . Folate, Serum    Standing Status:   Future    Standing Expiration Date:   10/03/2018  . Comprehensive metabolic panel    Standing Status:   Future    Standing Expiration Date:   10/03/2018   The patient has a good understanding of the overall plan. she agrees with it. she will call with any problems that may develop before the next visit here.   Rulon Eisenmenger, MD 10/03/17

## 2017-10-03 NOTE — Telephone Encounter (Signed)
Gave patient AVS and calendar of upcoming may appointments.  °

## 2017-10-31 ENCOUNTER — Encounter (HOSPITAL_COMMUNITY): Payer: Self-pay

## 2017-11-01 LAB — CHROMOSOME ANALYSIS, BONE MARROW

## 2017-11-22 ENCOUNTER — Ambulatory Visit: Payer: Self-pay | Admitting: Hematology and Oncology

## 2017-11-22 ENCOUNTER — Other Ambulatory Visit: Payer: Self-pay

## 2017-11-29 ENCOUNTER — Emergency Department (HOSPITAL_COMMUNITY)
Admission: EM | Admit: 2017-11-29 | Discharge: 2017-11-30 | Disposition: A | Discharge status: Home / Self Care | Payer: Self-pay | Attending: Emergency Medicine | Admitting: Emergency Medicine

## 2017-11-29 ENCOUNTER — Inpatient Hospital Stay (HOSPITAL_COMMUNITY): Admit: 2017-11-29 | Payer: Self-pay

## 2017-11-29 ENCOUNTER — Encounter (HOSPITAL_COMMUNITY): Payer: Self-pay | Admitting: *Deleted

## 2017-11-29 DIAGNOSIS — K766 Portal hypertension: Secondary | ICD-10-CM | POA: Insufficient documentation

## 2017-11-29 DIAGNOSIS — F1020 Alcohol dependence, uncomplicated: Secondary | ICD-10-CM | POA: Insufficient documentation

## 2017-11-29 DIAGNOSIS — R112 Nausea with vomiting, unspecified: Secondary | ICD-10-CM | POA: Insufficient documentation

## 2017-11-29 DIAGNOSIS — R10816 Epigastric abdominal tenderness: Secondary | ICD-10-CM | POA: Insufficient documentation

## 2017-11-29 DIAGNOSIS — Z0289 Encounter for other administrative examinations: Secondary | ICD-10-CM | POA: Insufficient documentation

## 2017-11-29 DIAGNOSIS — Z885 Allergy status to narcotic agent status: Secondary | ICD-10-CM | POA: Insufficient documentation

## 2017-11-29 DIAGNOSIS — Z87891 Personal history of nicotine dependence: Secondary | ICD-10-CM | POA: Insufficient documentation

## 2017-11-29 DIAGNOSIS — F339 Major depressive disorder, recurrent, unspecified: Secondary | ICD-10-CM | POA: Insufficient documentation

## 2017-11-29 DIAGNOSIS — Z79899 Other long term (current) drug therapy: Secondary | ICD-10-CM | POA: Insufficient documentation

## 2017-11-29 DIAGNOSIS — F1010 Alcohol abuse, uncomplicated: Secondary | ICD-10-CM

## 2017-11-29 LAB — COMPREHENSIVE METABOLIC PANEL
ALT: 43 U/L (ref 14–54)
AST: 96 U/L — ABNORMAL HIGH (ref 15–41)
Albumin: 3.6 g/dL (ref 3.5–5.0)
Alkaline Phosphatase: 106 U/L (ref 38–126)
Anion gap: 8 (ref 5–15)
BILIRUBIN TOTAL: 1.6 mg/dL — AB (ref 0.3–1.2)
BUN: 9 mg/dL (ref 6–20)
CHLORIDE: 109 mmol/L (ref 101–111)
CO2: 22 mmol/L (ref 22–32)
CREATININE: 0.43 mg/dL — AB (ref 0.44–1.00)
Calcium: 8.1 mg/dL — ABNORMAL LOW (ref 8.9–10.3)
GFR calc Af Amer: >60 mL/min (ref >60)
GLUCOSE: 109 mg/dL — AB (ref 65–99)
Potassium: 3.7 mmol/L (ref 3.5–5.1)
Sodium: 139 mmol/L (ref 135–145)
Total Protein: 7.1 g/dL (ref 6.5–8.1)

## 2017-11-29 LAB — CBC
HCT: 33.6 % — ABNORMAL LOW (ref 36.0–46.0)
Hemoglobin: 11.9 g/dL — ABNORMAL LOW (ref 12.0–15.0)
MCH: 33.8 pg (ref 26.0–34.0)
MCHC: 35.4 g/dL (ref 30.0–36.0)
MCV: 95.5 fL (ref 78.0–100.0)
PLATELETS: 57 10*3/uL — AB (ref 150–400)
RBC: 3.52 MIL/uL — ABNORMAL LOW (ref 3.87–5.11)
RDW: 15.3 % (ref 11.5–15.5)
WBC: 5.7 10*3/uL (ref 4.0–10.5)

## 2017-11-29 LAB — LIPASE, BLOOD: Lipase: 78 U/L — ABNORMAL HIGH (ref 11–51)

## 2017-11-29 LAB — I-STAT BETA HCG BLOOD, ED (MC, WL, AP ONLY): I-stat hCG, quantitative: <5 m[IU]/mL (ref <5)

## 2017-11-29 LAB — ETHANOL: ALCOHOL ETHYL (B): 451 mg/dL — AB (ref <10)

## 2017-11-29 MED ORDER — LORAZEPAM 2 MG/ML IJ SOLN
1.0000 mg | Freq: Once | INTRAMUSCULAR | Status: AC
  Administered 2017-11-30: 1 mg via INTRAVENOUS
  Filled 2017-11-29: qty 1

## 2017-11-29 MED ORDER — FAMOTIDINE IN NACL 20-0.9 MG/50ML-% IV SOLN
20.0000 mg | Freq: Once | INTRAVENOUS | Status: AC
  Administered 2017-11-30: 20 mg via INTRAVENOUS
  Filled 2017-11-29: qty 50

## 2017-11-29 MED ORDER — SODIUM CHLORIDE 0.9 % IV BOLUS (SEPSIS)
1000.0000 mL | Freq: Once | INTRAVENOUS | Status: AC
  Administered 2017-11-30: 1000 mL via INTRAVENOUS

## 2017-11-29 MED ORDER — ONDANSETRON HCL 4 MG/2ML IJ SOLN
4.0000 mg | Freq: Once | INTRAMUSCULAR | Status: AC
  Administered 2017-11-30: 4 mg via INTRAVENOUS
  Filled 2017-11-29: qty 2

## 2017-11-29 NOTE — ED Notes (Signed)
Pt called for room. No answer from lobby.
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
## 2017-11-29 NOTE — ED Triage Notes (Signed)
Per EMS, pt from home drank 5 bottles of wine today. Pt fell today around 2PM, complains of neck pain. Pt states she has been vomiting blood for several days and has abdominal pain.

## 2017-11-29 NOTE — H&P (Signed)
Behavioral Health Medical Screening Exam  Sara [name] is an 42 y.o. female. Who reportedly eloped from [facility] ED, as she was being evaluated for acute alcohol intoxication with reported alcohol level > 400 and reported fall at home. She presents to [facility] requesting alcohol detoxication, but is denying any SI/SA or HI at this time. She is chronically depressed but reportedly compliant with her antidepressants. Her last reported drink was 2 pm today (bottle of wine)  Total Time spent with patient: 20 minutes  Psychiatric Specialty Exam: Physical Exam  Constitutional: She is oriented to person, place, and time. She appears well-developed and well-nourished. No distress.  HENT:  Head: Normocephalic.  Eyes: Pupils are equal, round, and reactive to light.  Respiratory: Effort normal and breath sounds normal.  GI: Soft.  Neurological: She is alert and oriented to person, place, and time. No cranial nerve deficit.  Skin: Skin is warm and dry. She is not diaphoretic.  Psychiatric: Her affect is inappropriate. Her speech is slurred. She is slowed and withdrawn. Cognition and memory are impaired. She expresses impulsivity. She exhibits a depressed mood.    Review of Systems  Constitutional: Negative for diaphoresis, fever and weight loss.  Neurological: Positive for seizures. Negative for weakness.       Hx DT's  Psychiatric/Behavioral: Positive for depression and substance abuse.    There were no vitals taken for this visit.There is no height or weight on file to calculate BMI.  General Appearance: Disheveled  Eye Contact:  Fair  Speech:  Garbled  Volume:  Decreased  Mood:  Depressed  Affect:  Congruent  Thought Process:  Goal Directed  Orientation:  Full (Time, Place, and Person)  Thought Content:  Logical  Suicidal Thoughts:  No  Homicidal Thoughts:  No  Memory:  Immediate;   Fair  Judgement:  Impaired  Insight:  Lacking  Psychomotor Activity:  Negative  Concentration: Concentration: Poor   Recall:  Poor  Fund of Knowledge:Fair  Language: Fair  Akathisia:  Negative  Handed:  Right  AIMS (if indicated):     Assets:  Desire for Improvement  Sleep:       Musculoskeletal: Strength & Muscle Tone: within normal limits Gait & Station: unsteady Patient leans: N/A  There were no vitals taken for this visit.  Recommendations:  Based on my evaluation the patient appears to have an emergency medical condition for which I recommend the patient be transferred to the emergency department for further evaluation.  Laverle Hobby, PA-C 11/29/2017, 10:45 PM

## 2017-11-29 NOTE — ED Provider Notes (Signed)
Patient left before being seen  By me.  Never came back to a room.   Davonna Belling, MD 11/29/17 1807

## 2017-11-30 LAB — RAPID URINE DRUG SCREEN, HOSP PERFORMED
AMPHETAMINES: NOT DETECTED
BARBITURATES: NOT DETECTED
BENZODIAZEPINES: POSITIVE — AB
Cocaine: NOT DETECTED
Opiates: NOT DETECTED
Tetrahydrocannabinol: NOT DETECTED

## 2017-11-30 LAB — URINALYSIS, ROUTINE W REFLEX MICROSCOPIC
Bilirubin Urine: NEGATIVE
GLUCOSE, UA: NEGATIVE mg/dL
Hgb urine dipstick: NEGATIVE
Ketones, ur: 20 mg/dL — AB
LEUKOCYTES UA: NEGATIVE
Nitrite: NEGATIVE
PH: 5 (ref 5.0–8.0)
Protein, ur: NEGATIVE mg/dL
SPECIFIC GRAVITY, URINE: 1.026 (ref 1.005–1.030)

## 2017-12-19 ENCOUNTER — Other Ambulatory Visit: Payer: Self-pay

## 2017-12-19 ENCOUNTER — Emergency Department (HOSPITAL_COMMUNITY)
Admission: EM | Admit: 2017-12-19 | Discharge: 2017-12-19 | Disposition: A | Discharge status: Home / Self Care | Payer: No Typology Code available for payment source | Attending: Emergency Medicine | Admitting: Emergency Medicine

## 2017-12-19 ENCOUNTER — Emergency Department (HOSPITAL_COMMUNITY): Payer: No Typology Code available for payment source

## 2017-12-19 ENCOUNTER — Encounter (HOSPITAL_COMMUNITY): Payer: Self-pay | Admitting: Emergency Medicine

## 2017-12-19 DIAGNOSIS — F17210 Nicotine dependence, cigarettes, uncomplicated: Secondary | ICD-10-CM | POA: Insufficient documentation

## 2017-12-19 DIAGNOSIS — T148XXA Other injury of unspecified body region, initial encounter: Secondary | ICD-10-CM | POA: Diagnosis not present

## 2017-12-19 DIAGNOSIS — Y92093 Driveway of other non-institutional residence as the place of occurrence of the external cause: Secondary | ICD-10-CM | POA: Diagnosis not present

## 2017-12-19 DIAGNOSIS — Y9389 Activity, other specified: Secondary | ICD-10-CM | POA: Diagnosis not present

## 2017-12-19 DIAGNOSIS — F1010 Alcohol abuse, uncomplicated: Secondary | ICD-10-CM

## 2017-12-19 DIAGNOSIS — F10120 Alcohol abuse with intoxication, uncomplicated: Secondary | ICD-10-CM | POA: Diagnosis not present

## 2017-12-19 DIAGNOSIS — S0990XA Unspecified injury of head, initial encounter: Secondary | ICD-10-CM | POA: Diagnosis not present

## 2017-12-19 DIAGNOSIS — T07XXXA Unspecified multiple injuries, initial encounter: Secondary | ICD-10-CM

## 2017-12-19 DIAGNOSIS — Y998 Other external cause status: Secondary | ICD-10-CM | POA: Insufficient documentation

## 2017-12-19 DIAGNOSIS — F10920 Alcohol use, unspecified with intoxication, uncomplicated: Secondary | ICD-10-CM

## 2017-12-19 HISTORY — DX: Alcohol abuse, uncomplicated: F10.10

## 2017-12-19 LAB — RAPID URINE DRUG SCREEN, HOSP PERFORMED
AMPHETAMINES: NOT DETECTED
Barbiturates: NOT DETECTED
Benzodiazepines: NOT DETECTED
Cocaine: NOT DETECTED
Opiates: NOT DETECTED
TETRAHYDROCANNABINOL: NOT DETECTED

## 2017-12-19 LAB — CBC
HCT: 35.3 % — ABNORMAL LOW (ref 36.0–46.0)
HEMOGLOBIN: 12.4 g/dL (ref 12.0–15.0)
MCH: 34.6 pg — AB (ref 26.0–34.0)
MCHC: 35.1 g/dL (ref 30.0–36.0)
MCV: 98.6 fL (ref 78.0–100.0)
Platelets: 91 10*3/uL — ABNORMAL LOW (ref 150–400)
RBC: 3.58 MIL/uL — AB (ref 3.87–5.11)
RDW: 15.4 % (ref 11.5–15.5)
WBC: 4.7 10*3/uL (ref 4.0–10.5)

## 2017-12-19 LAB — COMPREHENSIVE METABOLIC PANEL
ALK PHOS: 96 U/L (ref 38–126)
ALT: 29 U/L (ref 14–54)
AST: 50 U/L — AB (ref 15–41)
Albumin: 3.9 g/dL (ref 3.5–5.0)
Anion gap: 8 (ref 5–15)
BUN: 8 mg/dL (ref 6–20)
CHLORIDE: 114 mmol/L — AB (ref 101–111)
CO2: 22 mmol/L (ref 22–32)
Calcium: 8.1 mg/dL — ABNORMAL LOW (ref 8.9–10.3)
Creatinine, Ser: 0.38 mg/dL — ABNORMAL LOW (ref 0.44–1.00)
GLUCOSE: 87 mg/dL (ref 65–99)
Potassium: 3.4 mmol/L — ABNORMAL LOW (ref 3.5–5.1)
Sodium: 144 mmol/L (ref 135–145)
Total Bilirubin: 1.2 mg/dL (ref 0.3–1.2)
Total Protein: 7.5 g/dL (ref 6.5–8.1)

## 2017-12-19 LAB — I-STAT BETA HCG BLOOD, ED (MC, WL, AP ONLY)

## 2017-12-19 LAB — ETHANOL: Alcohol, Ethyl (B): 332 mg/dL (ref <10)

## 2017-12-19 MED ORDER — CHLORDIAZEPOXIDE HCL 25 MG PO CAPS
25.0000 mg | ORAL_CAPSULE | Freq: Once | ORAL | Status: AC
  Administered 2017-12-19: 25 mg via ORAL
  Filled 2017-12-19: qty 1
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
## 2017-12-19 NOTE — ED Notes (Signed)
Bed: WTR8 Expected date:  Expected time:  Means of arrival:  Comments: EMS-[facility]-custody

## 2017-12-19 NOTE — ED Notes (Signed)
Bed: WA04 Expected date:  Expected time:  Means of arrival:  Comments: Tr8

## 2017-12-19 NOTE — ED Notes (Signed)
RN notified of abnormal lab 

## 2017-12-30 ENCOUNTER — Emergency Department (HOSPITAL_BASED_OUTPATIENT_CLINIC_OR_DEPARTMENT_OTHER)
Admission: EM | Admit: 2017-12-30 | Discharge: 2017-12-30 | Disposition: A | Discharge status: Home / Self Care | Payer: No Typology Code available for payment source | Attending: Physician Assistant | Admitting: Physician Assistant

## 2017-12-30 ENCOUNTER — Encounter (HOSPITAL_BASED_OUTPATIENT_CLINIC_OR_DEPARTMENT_OTHER): Payer: Self-pay | Admitting: *Deleted

## 2017-12-30 ENCOUNTER — Other Ambulatory Visit: Payer: Self-pay

## 2017-12-30 DIAGNOSIS — Z79899 Other long term (current) drug therapy: Secondary | ICD-10-CM | POA: Diagnosis not present

## 2017-12-30 DIAGNOSIS — F1010 Alcohol abuse, uncomplicated: Secondary | ICD-10-CM | POA: Insufficient documentation

## 2017-12-30 DIAGNOSIS — M79602 Pain in left arm: Secondary | ICD-10-CM | POA: Diagnosis not present

## 2017-12-30 DIAGNOSIS — F17210 Nicotine dependence, cigarettes, uncomplicated: Secondary | ICD-10-CM | POA: Diagnosis not present

## 2017-12-30 DIAGNOSIS — M25532 Pain in left wrist: Secondary | ICD-10-CM | POA: Insufficient documentation

## 2017-12-30 DIAGNOSIS — K766 Portal hypertension: Secondary | ICD-10-CM | POA: Insufficient documentation

## 2017-12-30 MED ORDER — TRAMADOL HCL 50 MG PO TABS: 50.0000 mg | ORAL_TABLET | Freq: Two times a day (BID) | ORAL | 0 refills | Status: DC | PRN

## 2017-12-30 NOTE — Discharge Instructions (Signed)
As we discussed please use heat ice, lidocaine patches, or icy hot to help with your symptoms.  Please follow-up with a primary care physician for further medications

## 2017-12-30 NOTE — ED Notes (Addendum)
EDP into room, prior to RN assessment, see MD notes, pending orders.   Alert, NAD, calm, interactive, resps e/u, speaking in clear complete sentences, no dyspnea noted, skin W&D, VSS, c/o L hand and ellbow pain, also L leg, rates 7/10, (denies: sob, NVD, dizziness or visual changes). Family at [facility].
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
## 2017-12-30 NOTE — ED Notes (Signed)
Pt and FM given d/c instructions as per chart. Rx x1. Verbalizes understanding. No questions.
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
## 2018-01-16 ENCOUNTER — Emergency Department (HOSPITAL_COMMUNITY): Payer: Self-pay

## 2018-01-16 ENCOUNTER — Other Ambulatory Visit: Payer: Self-pay

## 2018-01-16 ENCOUNTER — Encounter (HOSPITAL_COMMUNITY): Payer: Self-pay | Admitting: Emergency Medicine

## 2018-01-16 ENCOUNTER — Emergency Department (HOSPITAL_COMMUNITY)
Admission: EM | Admit: 2018-01-16 | Discharge: 2018-01-17 | Disposition: A | Discharge status: Home / Self Care | Payer: Self-pay | Attending: Emergency Medicine | Admitting: Emergency Medicine

## 2018-01-16 DIAGNOSIS — F10229 Alcohol dependence with intoxication, unspecified: Secondary | ICD-10-CM | POA: Insufficient documentation

## 2018-01-16 DIAGNOSIS — F17210 Nicotine dependence, cigarettes, uncomplicated: Secondary | ICD-10-CM | POA: Insufficient documentation

## 2018-01-16 DIAGNOSIS — Z79899 Other long term (current) drug therapy: Secondary | ICD-10-CM | POA: Insufficient documentation

## 2018-01-16 DIAGNOSIS — R079 Chest pain, unspecified: Secondary | ICD-10-CM | POA: Insufficient documentation

## 2018-01-16 DIAGNOSIS — K766 Portal hypertension: Secondary | ICD-10-CM | POA: Insufficient documentation

## 2018-01-16 DIAGNOSIS — F10920 Alcohol use, unspecified with intoxication, uncomplicated: Secondary | ICD-10-CM

## 2018-01-16 LAB — COMPREHENSIVE METABOLIC PANEL
ALBUMIN: 3.8 g/dL (ref 3.5–5.0)
ALK PHOS: 123 U/L (ref 38–126)
ALT: 60 U/L — ABNORMAL HIGH (ref 14–54)
ANION GAP: 12 (ref 5–15)
AST: 196 U/L — ABNORMAL HIGH (ref 15–41)
BILIRUBIN TOTAL: 1.8 mg/dL — AB (ref 0.3–1.2)
BUN: 6 mg/dL (ref 6–20)
CALCIUM: 8.8 mg/dL — AB (ref 8.9–10.3)
CO2: 25 mmol/L (ref 22–32)
Chloride: 110 mmol/L (ref 101–111)
Creatinine, Ser: 0.39 mg/dL — ABNORMAL LOW (ref 0.44–1.00)
GFR calc Af Amer: >60 mL/min (ref >60)
GFR calc non Af Amer: >60 mL/min (ref >60)
GLUCOSE: 102 mg/dL — AB (ref 65–99)
POTASSIUM: 3.7 mmol/L (ref 3.5–5.1)
Sodium: 147 mmol/L — ABNORMAL HIGH (ref 135–145)
TOTAL PROTEIN: 7.8 g/dL (ref 6.5–8.1)

## 2018-01-16 LAB — CBC WITH DIFFERENTIAL/PLATELET
BASOS PCT: 0 %
Basophils Absolute: 0 10*3/uL (ref 0.0–0.1)
EOS ABS: 0 10*3/uL (ref 0.0–0.7)
Eosinophils Relative: 1 %
HEMATOCRIT: 34.5 % — AB (ref 36.0–46.0)
Hemoglobin: 11.5 g/dL — ABNORMAL LOW (ref 12.0–15.0)
Lymphocytes Relative: 58 %
Lymphs Abs: 1.7 10*3/uL (ref 0.7–4.0)
MCH: 33.5 pg (ref 26.0–34.0)
MCHC: 33.3 g/dL (ref 30.0–36.0)
MCV: 100.6 fL — ABNORMAL HIGH (ref 78.0–100.0)
MONOS PCT: 3 %
Monocytes Absolute: 0.1 10*3/uL (ref 0.1–1.0)
NEUTROS ABS: 1.1 10*3/uL (ref 1.7–7.7)
Neutrophils Relative %: 38 %
Platelets: 32 10*3/uL — ABNORMAL LOW (ref 150–400)
RBC: 3.43 MIL/uL — ABNORMAL LOW (ref 3.87–5.11)
RDW: 14.7 % (ref 11.5–15.5)
WBC: 3 10*3/uL — ABNORMAL LOW (ref 4.0–10.5)

## 2018-01-16 LAB — CBC
HEMATOCRIT: 37.4 % (ref 36.0–46.0)
Hemoglobin: 12.5 g/dL (ref 12.0–15.0)
MCH: 33.4 pg (ref 26.0–34.0)
MCHC: 33.4 g/dL (ref 30.0–36.0)
MCV: 100 fL (ref 78.0–100.0)
Platelets: 46 10*3/uL — ABNORMAL LOW (ref 150–400)
RBC: 3.74 MIL/uL — ABNORMAL LOW (ref 3.87–5.11)
RDW: 14.6 % (ref 11.5–15.5)
WBC: 4 10*3/uL (ref 4.0–10.5)

## 2018-01-16 LAB — POC OCCULT BLOOD, ED: Fecal Occult Bld: NEGATIVE

## 2018-01-16 LAB — ETHANOL: Alcohol, Ethyl (B): 483 mg/dL (ref <10)

## 2018-01-16 LAB — I-STAT BETA HCG BLOOD, ED (MC, WL, AP ONLY): I-stat hCG, quantitative: <5 m[IU]/mL (ref <5)

## 2018-01-16 LAB — PROTIME-INR
INR: 1.2
Prothrombin Time: 15.1 seconds (ref 11.4–15.2)

## 2018-01-16 LAB — I-STAT TROPONIN, ED: Troponin i, poc: 0 ng/mL (ref 0.00–0.08)

## 2018-01-16 LAB — APTT: APTT: 36 s (ref 24–36)

## 2018-01-16 MED ORDER — LORAZEPAM 1 MG PO TABS: 0.0000 mg | ORAL_TABLET | Freq: Two times a day (BID) | ORAL | Status: DC

## 2018-01-16 MED ORDER — SODIUM CHLORIDE 0.9 % IV BOLUS (SEPSIS)
1000.0000 mL | Freq: Once | INTRAVENOUS | Status: AC
  Administered 2018-01-16: 1000 mL via INTRAVENOUS

## 2018-01-16 MED ORDER — ONDANSETRON HCL 4 MG/2ML IJ SOLN
2.0000 mg | Freq: Once | INTRAMUSCULAR | Status: AC
  Administered 2018-01-16: 2 mg via INTRAVENOUS

## 2018-01-16 MED ORDER — LORAZEPAM 1 MG PO TABS: 0.0000 mg | ORAL_TABLET | Freq: Four times a day (QID) | ORAL | Status: DC

## 2018-01-16 MED ORDER — LORAZEPAM 2 MG/ML IJ SOLN
0.0000 mg | Freq: Four times a day (QID) | INTRAMUSCULAR | Status: DC
  Administered 2018-01-16: 1 mg via INTRAVENOUS
  Administered 2018-01-17: 2 mg via INTRAVENOUS
  Filled 2018-01-16 (×2): qty 1

## 2018-01-16 MED ORDER — LORAZEPAM 2 MG/ML IJ SOLN
2.0000 mg | Freq: Once | INTRAMUSCULAR | Status: AC
  Administered 2018-01-16: 2 mg via INTRAVENOUS

## 2018-01-16 MED ORDER — LORAZEPAM 2 MG/ML IJ SOLN: 0.0000 mg | Freq: Two times a day (BID) | INTRAMUSCULAR | Status: DC

## 2018-01-16 MED ORDER — LORAZEPAM 2 MG/ML IJ SOLN
INTRAMUSCULAR | Status: AC
  Administered 2018-01-16: 2 mg
  Filled 2018-01-16: qty 1

## 2018-01-16 MED ORDER — ONDANSETRON HCL 4 MG/2ML IJ SOLN
INTRAMUSCULAR | Status: AC
  Filled 2018-01-16: qty 2

## 2018-01-16 MED ORDER — THIAMINE HCL 100 MG/ML IJ SOLN
Freq: Once | INTRAVENOUS | Status: AC
  Administered 2018-01-16: 18:00:00 via INTRAVENOUS
  Filled 2018-01-16: qty 1000

## 2018-01-16 NOTE — ED Triage Notes (Addendum)
Patient presents ambulatory c/o chest pain onset 6 am today. Reports upper mid chest pain non radiating with associated shortness of breathe and nausea. Patient also requesting alcohol detox. Last drink of alcohol 10am today reports half a bottle of wine. Denies SI/HI.

## 2018-01-16 NOTE — ED Notes (Signed)
Pt too intoxicated at this moment to assess CIWA score.

## 2018-01-16 NOTE — ED Notes (Signed)
Bed: [facility] Expected date:  Expected time:  Means of arrival:  Comments: 

## 2018-01-16 NOTE — ED Notes (Signed)
Alyssa EDPA made aware re pt's BAL of 483

## 2018-01-16 NOTE — ED Provider Notes (Signed)
10:00 p Pt had repeat cbc.  Hemoglobin 12.5 now 11.5  Pt has had 3 liters of Iv fluids.  Pt has been sleeping since arrival.  No vomiting, no bleeding. No coughing    Sidney Ace 01/16/18 2225    Dorie Rank, MD 01/20/18 [phone]
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
## 2018-01-17 LAB — RAPID URINE DRUG SCREEN, HOSP PERFORMED
AMPHETAMINES: NOT DETECTED
Barbiturates: NOT DETECTED
Benzodiazepines: POSITIVE — AB
Cocaine: NOT DETECTED
OPIATES: NOT DETECTED
Tetrahydrocannabinol: NOT DETECTED

## 2018-01-17 MED ORDER — LORAZEPAM 1 MG PO TABS
1.0000 mg | ORAL_TABLET | Freq: Once | ORAL | Status: AC
  Administered 2018-01-17: 1 mg via ORAL
  Filled 2018-01-17: qty 1

## 2018-01-17 MED ORDER — ONDANSETRON HCL 4 MG/2ML IJ SOLN
4.0000 mg | Freq: Once | INTRAMUSCULAR | Status: AC
  Administered 2018-01-17: 4 mg via INTRAVENOUS
  Filled 2018-01-17: qty 2

## 2018-01-17 MED ORDER — CHLORDIAZEPOXIDE HCL 25 MG PO CAPS: ORAL_CAPSULE | ORAL | 0 refills | Status: DC

## 2018-01-17 NOTE — ED Provider Notes (Signed)
4:34 AM A&Ox4. Ambulates. Clinically sober for discharge.  Reassessed.  VSS.  CIWA 7.  Given 1mg  PO ativan.  DC with librium.   Montine Circle, PA-C 01/17/18 2229    Ripley Fraise, MD 01/17/18 [phone]

## 2018-01-19 ENCOUNTER — Encounter (HOSPITAL_COMMUNITY): Payer: Self-pay | Admitting: *Deleted

## 2018-01-19 ENCOUNTER — Other Ambulatory Visit: Payer: Self-pay

## 2018-01-19 ENCOUNTER — Inpatient Hospital Stay (HOSPITAL_COMMUNITY)
Admission: EM | Admit: 2018-01-19 | Discharge: 2018-01-23 | DRG: 432 | Disposition: A | Discharge status: Home / Self Care | Payer: Self-pay | Attending: Internal Medicine | Admitting: Internal Medicine

## 2018-01-19 DIAGNOSIS — K3189 Other diseases of stomach and duodenum: Secondary | ICD-10-CM | POA: Diagnosis present

## 2018-01-19 DIAGNOSIS — D61818 Other pancytopenia: Secondary | ICD-10-CM | POA: Diagnosis present

## 2018-01-19 DIAGNOSIS — F1010 Alcohol abuse, uncomplicated: Secondary | ICD-10-CM

## 2018-01-19 DIAGNOSIS — D696 Thrombocytopenia, unspecified: Secondary | ICD-10-CM

## 2018-01-19 DIAGNOSIS — K920 Hematemesis: Secondary | ICD-10-CM | POA: Diagnosis present

## 2018-01-19 DIAGNOSIS — I8501 Esophageal varices with bleeding: Secondary | ICD-10-CM

## 2018-01-19 DIAGNOSIS — K7030 Alcoholic cirrhosis of liver without ascites: Secondary | ICD-10-CM

## 2018-01-19 DIAGNOSIS — F17210 Nicotine dependence, cigarettes, uncomplicated: Secondary | ICD-10-CM | POA: Diagnosis present

## 2018-01-19 DIAGNOSIS — K766 Portal hypertension: Secondary | ICD-10-CM | POA: Diagnosis present

## 2018-01-19 DIAGNOSIS — D62 Acute posthemorrhagic anemia: Secondary | ICD-10-CM | POA: Diagnosis present

## 2018-01-19 DIAGNOSIS — K7031 Alcoholic cirrhosis of liver with ascites: Principal | ICD-10-CM | POA: Diagnosis present

## 2018-01-19 DIAGNOSIS — F10239 Alcohol dependence with withdrawal, unspecified: Secondary | ICD-10-CM | POA: Diagnosis not present

## 2018-01-19 DIAGNOSIS — I8511 Secondary esophageal varices with bleeding: Secondary | ICD-10-CM | POA: Diagnosis present

## 2018-01-19 DIAGNOSIS — K219 Gastro-esophageal reflux disease without esophagitis: Secondary | ICD-10-CM | POA: Diagnosis present

## 2018-01-19 DIAGNOSIS — I8500 Esophageal varices without bleeding: Secondary | ICD-10-CM

## 2018-01-19 DIAGNOSIS — Z9049 Acquired absence of other specified parts of digestive tract: Secondary | ICD-10-CM

## 2018-01-19 DIAGNOSIS — F329 Major depressive disorder, single episode, unspecified: Secondary | ICD-10-CM | POA: Diagnosis present

## 2018-01-19 DIAGNOSIS — Z79899 Other long term (current) drug therapy: Secondary | ICD-10-CM

## 2018-01-19 DIAGNOSIS — E876 Hypokalemia: Secondary | ICD-10-CM | POA: Diagnosis present

## 2018-01-19 DIAGNOSIS — R161 Splenomegaly, not elsewhere classified: Secondary | ICD-10-CM | POA: Diagnosis present

## 2018-01-19 DIAGNOSIS — F419 Anxiety disorder, unspecified: Secondary | ICD-10-CM | POA: Diagnosis present

## 2018-01-19 DIAGNOSIS — I8510 Secondary esophageal varices without bleeding: Secondary | ICD-10-CM

## 2018-01-19 LAB — CBC
HCT: 29.4 % — ABNORMAL LOW (ref 36.0–46.0)
Hemoglobin: 9.9 g/dL — ABNORMAL LOW (ref 12.0–15.0)
MCH: 33.6 pg (ref 26.0–34.0)
MCHC: 33.7 g/dL (ref 30.0–36.0)
MCV: 99.7 fL (ref 78.0–100.0)
Platelets: 27 10*3/uL — CL (ref 150–400)
RBC: 2.95 MIL/uL — ABNORMAL LOW (ref 3.87–5.11)
RDW: 14.4 % (ref 11.5–15.5)
WBC: 4.2 10*3/uL (ref 4.0–10.5)

## 2018-01-19 LAB — COMPREHENSIVE METABOLIC PANEL
ALT: 52 U/L (ref 14–54)
AST: 153 U/L — ABNORMAL HIGH (ref 15–41)
Albumin: 3.2 g/dL — ABNORMAL LOW (ref 3.5–5.0)
Alkaline Phosphatase: 106 U/L (ref 38–126)
Anion gap: 13 (ref 5–15)
BUN: 15 mg/dL (ref 6–20)
CO2: 22 mmol/L (ref 22–32)
Calcium: 7.7 mg/dL — ABNORMAL LOW (ref 8.9–10.3)
Chloride: 107 mmol/L (ref 101–111)
Creatinine, Ser: 0.45 mg/dL (ref 0.44–1.00)
GFR calc Af Amer: >60 mL/min (ref >60)
GFR calc non Af Amer: >60 mL/min (ref >60)
Glucose, Bld: 121 mg/dL — ABNORMAL HIGH (ref 65–99)
Potassium: 4 mmol/L (ref 3.5–5.1)
Sodium: 142 mmol/L (ref 135–145)
Total Bilirubin: 1.9 mg/dL — ABNORMAL HIGH (ref 0.3–1.2)
Total Protein: 6.4 g/dL — ABNORMAL LOW (ref 6.5–8.1)

## 2018-01-19 LAB — MAGNESIUM: Magnesium: 1.5 mg/dL — ABNORMAL LOW (ref 1.7–2.4)

## 2018-01-19 LAB — LIPASE, BLOOD: Lipase: 42 U/L (ref 11–51)

## 2018-01-19 LAB — I-STAT BETA HCG BLOOD, ED (MC, WL, AP ONLY): I-stat hCG, quantitative: <5 m[IU]/mL (ref <5)

## 2018-01-19 LAB — HEMOGLOBIN AND HEMATOCRIT, BLOOD
HEMATOCRIT: 24.7 % — AB (ref 36.0–46.0)
HEMOGLOBIN: 8.4 g/dL — AB (ref 12.0–15.0)

## 2018-01-19 LAB — MRSA PCR SCREENING: MRSA by PCR: NEGATIVE

## 2018-01-19 MED ORDER — CEFTRIAXONE SODIUM 1 G IJ SOLR
1.0000 g | Freq: Once | INTRAMUSCULAR | Status: AC
  Administered 2018-01-19: 1 g via INTRAVENOUS
  Filled 2018-01-19: qty 10
  Filled 2018-01-19: qty 1

## 2018-01-19 MED ORDER — SODIUM CHLORIDE 0.9 % IV SOLN
80.0000 mg | Freq: Once | INTRAVENOUS | Status: AC
  Administered 2018-01-19: 80 mg via INTRAVENOUS
  Filled 2018-01-19: qty 80

## 2018-01-19 MED ORDER — CHLORHEXIDINE GLUCONATE 0.12 % MT SOLN
15.0000 mL | Freq: Two times a day (BID) | OROMUCOSAL | Status: DC
  Administered 2018-01-19 – 2018-01-22 (×6): 15 mL via OROMUCOSAL
  Filled 2018-01-19 (×6): qty 15

## 2018-01-19 MED ORDER — SODIUM CHLORIDE 0.9 % IV SOLN
2.0000 g | INTRAVENOUS | Status: DC
  Filled 2018-01-19: qty 20

## 2018-01-19 MED ORDER — SODIUM CHLORIDE 0.9 % IV BOLUS (SEPSIS)
1000.0000 mL | Freq: Once | INTRAVENOUS | Status: AC
  Administered 2018-01-19: 1000 mL via INTRAVENOUS

## 2018-01-19 MED ORDER — SODIUM CHLORIDE 0.9 % IV SOLN
1.0000 g | Freq: Once | INTRAVENOUS | Status: AC
  Administered 2018-01-19: 1 g via INTRAVENOUS
  Filled 2018-01-19: qty 10

## 2018-01-19 MED ORDER — SODIUM CHLORIDE 0.9 % IV SOLN
2.0000 g | INTRAVENOUS | Status: DC
  Administered 2018-01-20 – 2018-01-22 (×3): 2 g via INTRAVENOUS
  Filled 2018-01-19 (×3): qty 2

## 2018-01-19 MED ORDER — OCTREOTIDE ACETATE 500 MCG/ML IJ SOLN
50.0000 ug/h | INTRAMUSCULAR | Status: DC
  Administered 2018-01-19 – 2018-01-22 (×7): 50 ug/h via INTRAVENOUS
  Filled 2018-01-19 (×15): qty 1

## 2018-01-19 MED ORDER — LORAZEPAM 2 MG/ML IJ SOLN
2.0000 mg | INTRAMUSCULAR | Status: DC | PRN
  Administered 2018-01-19 – 2018-01-21 (×6): 2 mg via INTRAVENOUS
  Filled 2018-01-19 (×6): qty 1

## 2018-01-19 MED ORDER — THIAMINE HCL 100 MG/ML IJ SOLN
100.0000 mg | Freq: Every day | INTRAMUSCULAR | Status: DC
  Administered 2018-01-19: 100 mg via INTRAVENOUS
  Filled 2018-01-19: qty 2

## 2018-01-19 MED ORDER — SODIUM CHLORIDE 0.9 % IV SOLN
10.0000 mL/h | Freq: Once | INTRAVENOUS | Status: AC
  Administered 2018-01-19: 10 mL/h via INTRAVENOUS

## 2018-01-19 MED ORDER — MAGNESIUM SULFATE 4 GM/100ML IV SOLN
4.0000 g | Freq: Once | INTRAVENOUS | Status: AC
  Administered 2018-01-19: 4 g via INTRAVENOUS
  Filled 2018-01-19 (×2): qty 100

## 2018-01-19 MED ORDER — PANTOPRAZOLE SODIUM 40 MG IV SOLR
40.0000 mg | Freq: Two times a day (BID) | INTRAVENOUS | Status: DC
  Administered 2018-01-19 – 2018-01-22 (×5): 40 mg via INTRAVENOUS
  Filled 2018-01-19 (×5): qty 40

## 2018-01-19 MED ORDER — OCTREOTIDE LOAD VIA INFUSION
50.0000 ug | Freq: Once | INTRAVENOUS | Status: AC
  Administered 2018-01-19: 50 ug via INTRAVENOUS
  Filled 2018-01-19: qty 25

## 2018-01-19 MED ORDER — ORAL CARE MOUTH RINSE
15.0000 mL | Freq: Two times a day (BID) | OROMUCOSAL | Status: DC
  Administered 2018-01-20 – 2018-01-22 (×2): 15 mL via OROMUCOSAL
  Filled 2018-01-19: qty 15

## 2018-01-19 MED ORDER — ONDANSETRON HCL 4 MG/2ML IJ SOLN
4.0000 mg | Freq: Once | INTRAMUSCULAR | Status: AC
  Administered 2018-01-19: 4 mg via INTRAVENOUS
  Filled 2018-01-19: qty 2

## 2018-01-19 NOTE — ED Notes (Signed)
Bed: [facility]32 Expected date:  Expected time:  Means of arrival:  Comments: 42 yo hematemesis

## 2018-01-19 NOTE — ED Triage Notes (Signed)
Pt presents from home-vomiting gross amounts of bright red blood with clots. EMS has picture-a large bowl full of blood with blood all over the floor. Started today. Here two days ago for detox.   Hx esophageal varices, cirrhosis, ETOH use.   VS: 110/75, HR 120, SpO2 95%, 18 G in LAC. 4 mg Zofran given en route.

## 2018-01-19 NOTE — ED Notes (Signed)
Pt asked for water and this writer informed pt that doctor would have to be notified first. When returning to room pt was drinking water from the sink.

## 2018-01-19 NOTE — H&P (View-Only) (Signed)
Referring Provider: [facility]  Primary Care Physician:  Patient, No Pcp Per Primary Gastroenterologist:  Previous Alben Spittle,  MD  Reason for Consultation:   GI bleed, cirrhosis.     ASSESSMENT AND PLAN:    1. 42 yo female with alcoholic cirrhosis comp located by portal hypertension with esophageal varices banded in 2015.  Patient not seen in our office since 2016.  Unfortunately she continues to drink.  Trying to stop ETOH, last drink yesterday. l Presents with hematemesis which started around noon. Vomited large blood clots  -Continue Rocephin for SBP prophylaxis -She will need an EGD. Seems to be withdrawing from ETOH right now. Would prefer to do EGD with anesthesia support unless she starts actively bleeding again necessitating emergent procedure.  -In the interim continue octreotide drip and continue Protonix drip -monitor CBC, transfuse if needed -platelet count 27, getting platelet transfusion. INR normal at 1.2.   2. ABL. Hgb 11.5 three days ago, now at 9.9.  -Monitor CBC, I expect her hemoglobin will drop.  Transfuse as needed.  3. ETOH abuse. Several ED visits for intoxication and detox. Trying to stop, last drink yesterday. She is shaking in the bed. Says she shakes at home when stops drinking. Tbili 1.9. Transaminases in pattern typical for etOH.  -I talked to EDP about treating probable withdrawals and he will treat.   HPI: Sara [name] is a 42 y.o. female previously followed by Dr. Deatra Ina, last seen in 2016. She has a hx of ETOH cirrhosis complicated by portal HTN with esophageal varices banded in 2015.  Follow-up EGD September 2015 was normal..   [name] has cut back on ETOH intake and none since yesterday. Today around noon she began vomiting large clots of blood. BMs normal, not loose and no blood. She has had some mild LUQ discomfort for about 3 days. Her appetite is poor and not really eating. She doesn't take NSAIDs.     Past Medical History:  Diagnosis  Date  . Alcohol abuse   . Alcoholism ([place])   . Anemia   . Anxiety   . Blood transfusion without reported diagnosis   . Cirrhosis ([place])   . Depression   . 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 GERD (gastroesophageal reflux disease)   . Heart murmur    Patient states she may have  . Menorrhagia   . Pancytopenia ([place]) 01/15/2014  . Pneumonia   . Portal hypertension ([place])   . S/P alcohol detoxification    2-3 days at behavioral health previously  . UGI bleed 06/12/2014    Past Surgical History:  Procedure Laterality Date  . CHOLECYSTECTOMY    . ESOPHAGOGASTRODUODENOSCOPY N/A 06/12/2014   Procedure: ESOPHAGOGASTRODUODENOSCOPY (EGD);  Surgeon: Gatha Mayer, MD;  Location: [facility];  Service: Endoscopy;  Laterality: N/A;  . ESOPHAGOGASTRODUODENOSCOPY (EGD) WITH PROPOFOL N/A 07/29/2014   Procedure: ESOPHAGOGASTRODUODENOSCOPY (EGD) WITH PROPOFOL;  Surgeon: Inda Castle, MD;  Location: [facility];  Service: Endoscopy;  Laterality: N/A;    Prior to Admission medications   Medication Sig Start Date End Date Taking? Authorizing Provider  Multiple Vitamin (MULTIVITAMIN WITH MINERALS) TABS tablet Take 1 tablet by mouth daily.   Yes [provider]  QUEtiapine (SEROQUEL) 50 MG tablet Take 50 mg by mouth at bedtime. 11/10/17  Yes [provider]  thiamine (VITAMIN B-1) 100 MG tablet Take 1 tablet (100 mg total) by mouth daily. 09/11/17  Yes Duffy Bruce, MD  chlordiazePOXIDE (LIBRIUM) 25 MG capsule 50mg  PO TID x  1D, then 25-50mg  PO BID X 1D, then 25-50mg  PO QD X 1D Patient not taking: Reported on 01/19/2018 01/17/18   Montine Circle, PA-C    Current Facility-Administered Medications  Medication Dose Route Frequency Provider Last Rate Last Dose  . 0.9 %  sodium chloride infusion  10 mL/hr Intravenous Once Virgel Manifold, MD      . cefTRIAXone (ROCEPHIN) 1 g in sodium chloride 0.9 % 100 mL IVPB  1 g Intravenous Once Virgel Manifold, MD 200 mL/hr at  01/19/18 1552 1 g at 01/19/18 1552  . octreotide (SANDOSTATIN) 500 mcg in sodium chloride 0.9 % 250 mL (2 mcg/mL) infusion  50 mcg/hr Intravenous Continuous Virgel Manifold, MD 25 mL/hr at 01/19/18 1600 50 mcg/hr at 01/19/18 1600   Current Outpatient Medications  Medication Sig Dispense Refill  . Multiple Vitamin (MULTIVITAMIN WITH MINERALS) TABS tablet Take 1 tablet by mouth daily.    . QUEtiapine (SEROQUEL) 50 MG tablet Take 50 mg by mouth at bedtime.    . thiamine (VITAMIN B-1) 100 MG tablet Take 1 tablet (100 mg total) by mouth daily. 30 tablet 0  . chlordiazePOXIDE (LIBRIUM) 25 MG capsule 50mg  PO TID x 1D, then 25-50mg  PO BID X 1D, then 25-50mg  PO QD X 1D (Patient not taking: Reported on 01/19/2018) 10 capsule 0    Allergies as of 01/19/2018 - Review Complete 01/19/2018  Allergen Reaction Noted  . Morphine and related Other (See Comments) 03/11/2013  . Nsaids Other (See Comments) 07/18/2014    Family History  Problem Relation Age of Onset  . Colon polyps Mother   . Hypertension Mother   . Thyroid disease Mother   . Alcoholism Mother   . Alcoholism Father   . Alcohol abuse Maternal Grandfather   . Alcohol abuse Paternal Grandfather   . Alcohol abuse Paternal Aunt   . Alcohol abuse Maternal Uncle     Social History   Socioeconomic History  . Marital status: Married    Spouse name: [name]  . Number of children: 2  . Years of education: Associates  . Highest education level: Not on file  Social Needs  . Financial resource strain: Not on file  . Food insecurity - worry: Not on file  . Food insecurity - inability: Not on file  . Transportation needs - medical: Not on file  . Transportation needs - non-medical: Not on file  Occupational History  . Occupation: Paralegal  Tobacco Use  . Smoking status: Current Every Day Smoker    Packs/day: 0.25    Types: Cigarettes  . Smokeless tobacco: Never Used  Substance and Sexual Activity  . Alcohol use: Yes    Alcohol/week: 0.0  oz    Comment: Usually drinks 2-3 bottles of wine daily when drinking.   . Drug use: No    Comment: denies  . Sexual activity: No    Birth control/protection: None  Other Topics Concern  . Not on file  Social History Narrative   Lives with husband and son. Daughter lives nearby. 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    Review of Systems: All systems reviewed and negative except where noted in HPI.  Physical Exam: Vital signs in last 24 hours: Temp:  [99.2 F (37.3 C)] 99.2 F (37.3 C) (03/01 1407) Pulse Rate:  [127] 127 (03/01 1407) Resp:  [18] 18 (03/01 1407) BP: (112)/(76) 112/76 (03/01 1407) SpO2:  [98 %] 98 % (03/01 1407)   General:   Alert, well-developed,  female in  NAD Psych:  Pleasant, cooperative. Normal mood and affect. Eyes:  Pupils equal, sclera clear, no icterus.   Conjunctiva pink. Ears:  Normal auditory acuity. Nose:  No deformity, discharge,  or lesions. Neck:  Supple; no masses Lungs:  Clear throughout to auscultation.   No wheezes, crackles, or rhonchi.  Heart:  Regular rate and rhythm; no murmurs, no edema Abdomen:  Soft, non-distended, nontender, BS active, no palp mass    Msk:  Symmetrical without gross deformities. . Neurologic:  Alert and  oriented x4;  grossly normal neurologically. Skin:  Intact without significant lesions or rashes..   Intake/Output from previous day: No intake/output data recorded. Intake/Output this shift: No intake/output data recorded.  Lab Results: Recent Labs    01/16/18 2107 01/19/18 1417  WBC 3.0* 4.2  HGB 11.5* 9.9*  HCT 34.5* 29.4*  PLT 32* 27*   BMET Recent Labs    01/19/18 1417  NA 142  K 4.0  CL 107  CO2 22  GLUCOSE 121*  BUN 15  CREATININE 0.45  CALCIUM 7.7*   LFT Recent Labs    01/19/18 1417  PROT 6.4*  ALBUMIN 3.2*  AST 153*  ALT 52  ALKPHOS 106  BILITOT 1.9*   Studies/Results: No results found.   Tye Savoy, NP-C @  01/19/2018, 4:10 PM  Pager number  [phone]   Attending physician's note   I have taken a history, examined the patient and reviewed the chart. I agree with the Advanced Practitioner's note, impression and recommendations. 42 year old female with history of chronic alcohol use, alcoholic cirrhosis, known esophageal varices status post banding in 2015 presented hematemesis.  INR at 1.2, platelet count 27.  Hemoglobin 9.9 from baseline 11-12 Continue PPI and octreotide drip Monitor hemoglobin every 12 hours and transfuse as needed to maintain hemoglobin greater than 7 Patient has been drinking 2 bottles of wine daily and is currently actively withdrawing from alcohol We will plan for EGD with anesthesia tomorrow morning or emergently sooner if needed Ceftriaxone for SBP prophylaxis Will request abdominal ultrasound with Dopplers, no recent imaging  Damaris Hippo, MD [phone] Mon-Fri 8a-5p [phone] after 5p, weekends, holidays

## 2018-01-19 NOTE — H&P (Signed)
History and Physical  Sara [name] QJJ:[id number] DOB: 1976-02-06 DOA: 01/19/2018  Referring physician: EDP PCP: Patient, No Pcp Per   Chief Complaint: Hematemesis  HPI: Sara [name] is a 42 y.o. 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  Per chart review she was in the ED several times recently for alcohol intoxication and alcohol detox.  He was brought in from home by EMS to the ED today.  Per EMS patient had large bowl full of blood with  Food all over the floor.   ED course: She has sinus tachycardia heart rate 127 on presentation, pressure 48.1, systolic blood pressure in the 100, room air no hypoxia.  No active bleeding since in the ED.  CBC hemoglobin 9.9, platelet 27, INR a few days ago 1.2, BUN 15 creatinine 0.45, AST 153, ALT 52, total bilirubin 1.9.  She received octreotide, Protonix, Rocephin, 1 units of platelets ordered by EDP. LBGI consulted, hospitalist called to admit the patient.  Review of Systems:  Detail per HPI, Review of systems are otherwise negative  Past Medical History:  Diagnosis Date  . Alcohol abuse   . Alcoholism ([place])   . Anemia   . Anxiety   . Blood transfusion without reported diagnosis   . Cirrhosis ([place])   . Depression   . 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 GERD (gastroesophageal reflux disease)   . Heart murmur    Patient states she may have  . Menorrhagia   . Pancytopenia ([place]) 01/15/2014  . Pneumonia   . Portal hypertension ([place])   . S/P alcohol detoxification    2-3 days at behavioral health previously  . UGI bleed 06/12/2014   Past Surgical History:  Procedure Laterality Date  . CHOLECYSTECTOMY    . ESOPHAGOGASTRODUODENOSCOPY N/A 06/12/2014   Procedure: ESOPHAGOGASTRODUODENOSCOPY (EGD);  Surgeon: Gatha Mayer, MD;  Location: [facility];  Service: Endoscopy;  Laterality: N/A;  . ESOPHAGOGASTRODUODENOSCOPY (EGD) WITH PROPOFOL N/A 07/29/2014   Procedure: ESOPHAGOGASTRODUODENOSCOPY (EGD) WITH PROPOFOL;  Surgeon: Inda Castle, MD;  Location: [facility];  Service: Endoscopy;  Laterality: N/A;   Social History:  reports that she has been smoking cigarettes.  She has been smoking about 0.25 packs per day. she has never used smokeless tobacco. She reports that she drinks alcohol. She reports that she does not use drugs. Patient lives at home & is able to participate in activities of daily living independently   Allergies  Allergen Reactions  . Morphine And Related Other (See Comments)    Slowed HR, lowered BP  . Nsaids Other (See Comments)    bleeding    Family History  Problem Relation Age of Onset  . Colon polyps Mother   . Hypertension Mother   . Thyroid disease Mother   . Alcoholism Mother   . Alcoholism Father   . Alcohol abuse Maternal Grandfather   . Alcohol abuse Paternal Grandfather   . Alcohol abuse Paternal Aunt   . Alcohol abuse Maternal Uncle       Prior to Admission medications   Medication Sig Start Date End Date Taking? Authorizing Provider  Multiple Vitamin (MULTIVITAMIN WITH MINERALS) TABS tablet Take 1 tablet by mouth daily.   Yes [provider]  QUEtiapine (SEROQUEL) 50 MG tablet Take 50 mg by mouth at bedtime. 11/10/17  Yes [provider]  thiamine (VITAMIN B-1) 100 MG tablet Take 1 tablet (100 mg total) by mouth daily. 09/11/17  Yes Duffy Bruce, MD  chlordiazePOXIDE (LIBRIUM) 25 MG capsule 50mg  PO TID x 1D, then 25-50mg  PO BID X 1D, then 25-50mg  PO QD X 1D Patient not taking: Reported on 01/19/2018 01/17/18   Montine Circle, PA-C    Physical Exam: BP 112/76 (BP Location: Left Arm)   Pulse (!) 127   Temp 99.2 F (37.3 C) (Oral)   Resp 18   SpO2 98%   General:  Weak, aaox3 Eyes: PERRL ENT: unremarkable Neck: supple, no  JVD Cardiovascular: sinus tachycardia has improved Respiratory: CTABL Abdomen: mild tender epigastric region, no guarding, no rebound, positive bowel sounds Skin: scattered telangiectasia on chest Musculoskeletal:  No edema Psychiatric: calm/cooperative Neurologic: no focal findings            Labs on Admission:  Basic Metabolic Panel: Recent Labs  Lab 01/16/18 1401 01/19/18 1417  NA 147* 142  K 3.7 4.0  CL 110 107  CO2 25 22  GLUCOSE 102* 121*  BUN 6 15  CREATININE 0.39* 0.45  CALCIUM 8.8* 7.7*   Liver Function Tests: Recent Labs  Lab 01/16/18 1401 01/19/18 1417  AST 196* 153*  ALT 60* 52  ALKPHOS 123 106  BILITOT 1.8* 1.9*  PROT 7.8 6.4*  ALBUMIN 3.8 3.2*   No results for input(s): LIPASE, AMYLASE in the last 168 hours. No results for input(s): AMMONIA in the last 168 hours. CBC: Recent Labs  Lab 01/16/18 1401 01/16/18 2107 01/19/18 1417  WBC 4.0 3.0* 4.2  NEUTROABS  --  1.1  --   HGB 12.5 11.5* 9.9*  HCT 37.4 34.5* 29.4*  MCV 100.0 100.6* 99.7  PLT 46* 32* 27*   Cardiac Enzymes: No results for input(s): CKTOTAL, CKMB, CKMBINDEX, TROPONINI in the last 168 hours.  BNP (last 3 results) No results for input(s): BNP in the last 8760 hours.  ProBNP (last 3 results) No results for input(s): PROBNP in the last 8760 hours.  CBG: No results for input(s): GLUCAP in the last 168 hours.  Radiological Exams on Admission: No results found.  EKG: Independently reviewed. Sinus tachycardia, Qtc 532  Assessment/Plan Present on Admission: . Hematemesis   Hematemesis With history of alcohol, varices Continue PPI, continue octreotide, empiric Rocephin Admit to stepdown, H&H every 6 hours Npo, Lower GI consulted  Alcohol cirrhosis/splenomegaly/pancytopenia Per chart review she was evaluated by hematology which think pancytopenia is from alcohol supportive care, 1 units of platelets ordered by EDP On alcohol withdrawal protocol  Elevated LFT Likely  from alcohol  per chart review Ultrasound done in October 2018 with cirrhosis , splenomegaly ,no focal hepatic mass Hepatitis /HIV panel in process  Qtc prolongation: will check mag, avoid Qtc prolonging agent  DVT prophylaxis:scd   Consultants:  [facility]BGI  Code Status: full   Family Communication:  Patient   Disposition Plan: admit to stepdown  Time spent: 45mins  Florencia Reasons MD, PhD [facility] Pager [phone] If 7PM-7AM, please contact night-coverage at [email or web address], password [facility]

## 2018-01-19 NOTE — Consult Note (Addendum)
Referring Provider: [facility]  Primary Care Physician:  Patient, No Pcp Per Primary Gastroenterologist:  Sara Alben Spittle,  MD  Reason for Consultation:   GI bleed, cirrhosis.     ASSESSMENT AND PLAN:    1. 42 yo female with alcoholic cirrhosis comp located by portal hypertension with esophageal varices banded in 2015.  Patient not seen in our office since 2016.  Unfortunately she continues to drink.  Trying to stop ETOH, last drink yesterday. l Presents with hematemesis which started around noon. Vomited large blood clots  -Continue Rocephin for SBP prophylaxis -She will need an EGD. Seems to be withdrawing from ETOH right now. Would prefer to do EGD with anesthesia support unless she starts actively bleeding again necessitating emergent procedure.  -In the interim continue octreotide drip and continue Protonix drip -monitor CBC, transfuse if needed -platelet count 27, getting platelet transfusion. INR normal at 1.2.   2. ABL. Hgb 11.5 three days ago, now at 9.9.  -Monitor CBC, I expect her hemoglobin will drop.  Transfuse as needed.  3. ETOH abuse. Several ED visits for intoxication and detox. Trying to stop, last drink yesterday. She is shaking in the bed. Says she shakes at home when stops drinking. Tbili 1.9. Transaminases in pattern typical for etOH.  -I talked to EDP about treating probable withdrawals and he will treat.   HPI: Sara [name] is a 42 y.o. female previously followed by Dr. Deatra [name], last seen in 2016. She has a hx of ETOH cirrhosis complicated by portal HTN with esophageal varices banded in 2015.  Follow-up EGD September 2015 was normal..   Sara [name] has cut back on ETOH intake and none since yesterday. Today around noon she began vomiting large clots of blood. BMs normal, not loose and no blood. She has had some mild LUQ discomfort for about 3 days. Her appetite is poor and not really eating. She doesn't take NSAIDs.     Past Medical History:  Diagnosis  Date  . Alcohol abuse   . Alcoholism ([place])   . Anemia   . Anxiety   . Blood transfusion without reported diagnosis   . Cirrhosis ([place])   . Depression   . 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 GERD (gastroesophageal reflux disease)   . Heart murmur    Patient states she may have  . Menorrhagia   . Pancytopenia ([place]) 01/15/2014  . Pneumonia   . Portal hypertension ([place])   . S/P alcohol detoxification    2-3 days at behavioral health previously  . UGI bleed 06/12/2014    Past Surgical History:  Procedure Laterality Date  . CHOLECYSTECTOMY    . ESOPHAGOGASTRODUODENOSCOPY N/A 06/12/2014   Procedure: ESOPHAGOGASTRODUODENOSCOPY (EGD);  Surgeon: Gatha Mayer, MD;  Location: [facility];  Service: Endoscopy;  Laterality: N/A;  . ESOPHAGOGASTRODUODENOSCOPY (EGD) WITH PROPOFOL N/A 07/29/2014   Procedure: ESOPHAGOGASTRODUODENOSCOPY (EGD) WITH PROPOFOL;  Surgeon: Inda Castle, MD;  Location: [facility];  Service: Endoscopy;  Laterality: N/A;    Prior to Admission medications   Medication Sig Start Date End Date Taking? Authorizing Provider  Multiple Vitamin (MULTIVITAMIN WITH MINERALS) TABS tablet Take 1 tablet by mouth daily.   Yes [provider]  QUEtiapine (SEROQUEL) 50 MG tablet Take 50 mg by mouth at bedtime. 11/10/17  Yes [provider]  thiamine (VITAMIN B-1) 100 MG tablet Take 1 tablet (100 mg total) by mouth daily. 09/11/17  Yes Duffy Bruce, MD  chlordiazePOXIDE (LIBRIUM) 25 MG capsule 50mg  PO TID x  1D, then 25-50mg  PO BID X 1D, then 25-50mg  PO QD X 1D Patient not taking: Reported on 01/19/2018 01/17/18   Montine Circle, PA-C    Current Facility-Administered Medications  Medication Dose Route Frequency Provider Last Rate Last Dose  . 0.9 %  sodium chloride infusion  10 mL/hr Intravenous Once Virgel Manifold, MD      . cefTRIAXone (ROCEPHIN) 1 g in sodium chloride 0.9 % 100 mL IVPB  1 g Intravenous Once Virgel Manifold, MD 200 mL/hr at  01/19/18 1552 1 g at 01/19/18 1552  . octreotide (SANDOSTATIN) 500 mcg in sodium chloride 0.9 % 250 mL (2 mcg/mL) infusion  50 mcg/hr Intravenous Continuous Virgel Manifold, MD 25 mL/hr at 01/19/18 1600 50 mcg/hr at 01/19/18 1600   Current Outpatient Medications  Medication Sig Dispense Refill  . Multiple Vitamin (MULTIVITAMIN WITH MINERALS) TABS tablet Take 1 tablet by mouth daily.    . QUEtiapine (SEROQUEL) 50 MG tablet Take 50 mg by mouth at bedtime.    . thiamine (VITAMIN B-1) 100 MG tablet Take 1 tablet (100 mg total) by mouth daily. 30 tablet 0  . chlordiazePOXIDE (LIBRIUM) 25 MG capsule 50mg  PO TID x 1D, then 25-50mg  PO BID X 1D, then 25-50mg  PO QD X 1D (Patient not taking: Reported on 01/19/2018) 10 capsule 0    Allergies as of 01/19/2018 - Review Complete 01/19/2018  Allergen Reaction Noted  . Morphine and related Other (See Comments) 03/11/2013  . Nsaids Other (See Comments) 07/18/2014    Family History  Problem Relation Age of Onset  . Colon polyps Mother   . Hypertension Mother   . Thyroid disease Mother   . Alcoholism Mother   . Alcoholism Father   . Alcohol abuse Maternal Grandfather   . Alcohol abuse Paternal Grandfather   . Alcohol abuse Paternal Aunt   . Alcohol abuse Maternal Uncle     Social History   Socioeconomic History  . Marital status: Married    Spouse name: Sara [name]  . Number of children: 2  . Years of education: Associates  . Highest education level: Not on file  Social Needs  . Financial resource strain: Not on file  . Food insecurity - worry: Not on file  . Food insecurity - inability: Not on file  . Transportation needs - medical: Not on file  . Transportation needs - non-medical: Not on file  Occupational History  . Occupation: Paralegal  Tobacco Use  . Smoking status: Current Every Day Smoker    Packs/day: 0.25    Types: Cigarettes  . Smokeless tobacco: Never Used  Substance and Sexual Activity  . Alcohol use: Yes    Alcohol/week: 0.0  oz    Comment: Usually drinks 2-3 bottles of wine daily when drinking.   . Drug use: No    Comment: denies  . Sexual activity: No    Birth control/protection: None  Other Topics Concern  . Not on file  Social History Narrative   Lives with husband and son. Daughter lives nearby. 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    Review of Systems: All systems reviewed and negative except where noted in HPI.  Physical Exam: Vital signs in last 24 hours: Temp:  [99.2 F (37.3 C)] 99.2 F (37.3 C) (03/01 1407) Pulse Rate:  [127] 127 (03/01 1407) Resp:  [18] 18 (03/01 1407) BP: (112)/(76) 112/76 (03/01 1407) SpO2:  [98 %] 98 % (03/01 1407)   General:   Alert, well-developed,  female in  NAD Psych:  Pleasant, cooperative. Normal mood and affect. Eyes:  Pupils equal, sclera clear, no icterus.   Conjunctiva pink. Ears:  Normal auditory acuity. Nose:  No deformity, discharge,  or lesions. Neck:  Supple; no masses Lungs:  Clear throughout to auscultation.   No wheezes, crackles, or rhonchi.  Heart:  Regular rate and rhythm; no murmurs, no edema Abdomen:  Soft, non-distended, nontender, BS active, no palp mass    Msk:  Symmetrical without gross deformities. . Neurologic:  Alert and  oriented x4;  grossly normal neurologically. Skin:  Intact without significant lesions or rashes..   Intake/Output from Sara day: No intake/output data recorded. Intake/Output this shift: No intake/output data recorded.  Lab Results: Recent Labs    01/16/18 2107 01/19/18 1417  WBC 3.0* 4.2  HGB 11.5* 9.9*  HCT 34.5* 29.4*  PLT 32* 27*   BMET Recent Labs    01/19/18 1417  NA 142  K 4.0  CL 107  CO2 22  GLUCOSE 121*  BUN 15  CREATININE 0.45  CALCIUM 7.7*   LFT Recent Labs    01/19/18 1417  PROT 6.4*  ALBUMIN 3.2*  AST 153*  ALT 52  ALKPHOS 106  BILITOT 1.9*   Studies/Results: No results found.   Tye Savoy, NP-C @  01/19/2018, 4:10 PM  Pager number  [phone]   Attending physician's note   I have taken a history, examined the patient and reviewed the chart. I agree with the Advanced Practitioner's note, impression and recommendations. 42 year old female with history of chronic alcohol use, alcoholic cirrhosis, known esophageal varices status post banding in 2015 presented hematemesis.  INR at 1.2, platelet count 27.  Hemoglobin 9.9 from baseline 11-12 Continue PPI and octreotide drip Monitor hemoglobin every 12 hours and transfuse as needed to maintain hemoglobin greater than 7 Patient has been drinking 2 bottles of wine daily and is currently actively withdrawing from alcohol We will plan for EGD with anesthesia tomorrow morning or emergently sooner if needed Ceftriaxone for SBP prophylaxis Will request abdominal ultrasound with Dopplers, no recent imaging  Damaris Hippo, MD [phone] Mon-Fri 8a-5p [phone] after 5p, weekends, holidays
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
## 2018-01-19 NOTE — Anesthesia Preprocedure Evaluation (Addendum)
Anesthesia Evaluation  Patient identified by MRN, date of birth, ID band Patient awake    Reviewed: Allergy & Precautions, NPO status , Patient's Chart, lab work & pertinent test results  Airway Mallampati: III  TM Distance: >3 FB Neck ROM: Full    Dental no notable dental hx.    Pulmonary Current Smoker,    Pulmonary exam normal breath sounds clear to auscultation       Cardiovascular negative cardio ROS Normal cardiovascular exam Rhythm:Regular Rate:Normal     Neuro/Psych PSYCHIATRIC DISORDERS Anxiety Depression negative neurological ROS     GI/Hepatic GERD  ,(+) Cirrhosis     substance abuse  , Portal hypertension S/P alcohol detoxification Esophageal varices   Endo/Other  negative endocrine ROS  Renal/GU negative Renal ROS     Musculoskeletal negative musculoskeletal ROS (+)   Abdominal   Peds  Hematology  (+) anemia , Thrombocytopenia    Anesthesia Other Findings Hematemesis  Reproductive/Obstetrics                            Anesthesia Physical Anesthesia Plan  ASA: IV  Anesthesia Plan: MAC   Post-op Pain Management:    Induction: Intravenous  PONV Risk Score and Plan: 1 and Treatment may vary due to age or medical condition and Propofol infusion  Airway Management Planned: Natural Airway  Additional Equipment:   Intra-op Plan:   Post-operative Plan:   Informed Consent: I have reviewed the patients History and Physical, chart, labs and discussed the procedure including the risks, benefits and alternatives for the proposed anesthesia with the patient or authorized representative who has indicated his/her understanding and acceptance.   Dental advisory given  Plan Discussed with: CRNA  Anesthesia Plan Comments:         Anesthesia Quick Evaluation

## 2018-01-20 ENCOUNTER — Inpatient Hospital Stay (HOSPITAL_COMMUNITY): Payer: Self-pay | Admitting: Anesthesiology

## 2018-01-20 ENCOUNTER — Encounter (HOSPITAL_COMMUNITY)
Admission: EM | Disposition: A | Discharge status: Home / Self Care | Payer: Self-pay | Source: Home / Self Care | Attending: Internal Medicine

## 2018-01-20 ENCOUNTER — Inpatient Hospital Stay (HOSPITAL_COMMUNITY): Payer: Self-pay

## 2018-01-20 ENCOUNTER — Encounter (HOSPITAL_COMMUNITY): Payer: Self-pay

## 2018-01-20 DIAGNOSIS — I8511 Secondary esophageal varices with bleeding: Secondary | ICD-10-CM

## 2018-01-20 DIAGNOSIS — K7030 Alcoholic cirrhosis of liver without ascites: Secondary | ICD-10-CM

## 2018-01-20 DIAGNOSIS — D696 Thrombocytopenia, unspecified: Secondary | ICD-10-CM

## 2018-01-20 DIAGNOSIS — K920 Hematemesis: Secondary | ICD-10-CM

## 2018-01-20 DIAGNOSIS — K3189 Other diseases of stomach and duodenum: Secondary | ICD-10-CM

## 2018-01-20 DIAGNOSIS — I8510 Secondary esophageal varices without bleeding: Secondary | ICD-10-CM

## 2018-01-20 HISTORY — PX: ESOPHAGOGASTRODUODENOSCOPY (EGD) WITH PROPOFOL: SHX5813

## 2018-01-20 LAB — COMPREHENSIVE METABOLIC PANEL
ALT: 43 U/L (ref 14–54)
ANION GAP: 10 (ref 5–15)
AST: 118 U/L — AB (ref 15–41)
Albumin: 2.8 g/dL — ABNORMAL LOW (ref 3.5–5.0)
Alkaline Phosphatase: 81 U/L (ref 38–126)
BUN: 12 mg/dL (ref 6–20)
CHLORIDE: 109 mmol/L (ref 101–111)
CO2: 23 mmol/L (ref 22–32)
Calcium: 7 mg/dL — ABNORMAL LOW (ref 8.9–10.3)
Creatinine, Ser: 0.41 mg/dL — ABNORMAL LOW (ref 0.44–1.00)
GFR calc non Af Amer: >60 mL/min (ref >60)
Glucose, Bld: 101 mg/dL — ABNORMAL HIGH (ref 65–99)
POTASSIUM: 4.2 mmol/L (ref 3.5–5.1)
Sodium: 142 mmol/L (ref 135–145)
Total Bilirubin: 2 mg/dL — ABNORMAL HIGH (ref 0.3–1.2)
Total Protein: 5.3 g/dL — ABNORMAL LOW (ref 6.5–8.1)

## 2018-01-20 LAB — CBC
HCT: 22.8 % — ABNORMAL LOW (ref 36.0–46.0)
Hemoglobin: 7.7 g/dL — ABNORMAL LOW (ref 12.0–15.0)
MCH: 33.6 pg (ref 26.0–34.0)
MCHC: 33.8 g/dL (ref 30.0–36.0)
MCV: 99.6 fL (ref 78.0–100.0)
PLATELETS: 30 10*3/uL — AB (ref 150–400)
RBC: 2.29 MIL/uL — ABNORMAL LOW (ref 3.87–5.11)
RDW: 15.5 % (ref 11.5–15.5)
WBC: 2.4 10*3/uL — AB (ref 4.0–10.5)

## 2018-01-20 LAB — PREPARE PLATELET PHERESIS: Unit division: 0

## 2018-01-20 LAB — HIV ANTIBODY (ROUTINE TESTING W REFLEX): HIV Screen 4th Generation wRfx: NONREACTIVE

## 2018-01-20 LAB — BPAM PLATELET PHERESIS
Blood Product Expiration Date: 201903032359
ISSUE DATE / TIME: 201903011802
Unit Type and Rh: 6200

## 2018-01-20 LAB — PROTIME-INR
INR: 1.49
PROTHROMBIN TIME: 17.9 s — AB (ref 11.4–15.2)

## 2018-01-20 LAB — HEMOGLOBIN AND HEMATOCRIT, BLOOD
HCT: 26.1 % — ABNORMAL LOW (ref 36.0–46.0)
HEMATOCRIT: 26.2 % — AB (ref 36.0–46.0)
HEMATOCRIT: 20.2 % — AB (ref 36.0–46.0)
HEMOGLOBIN: 9 g/dL — AB (ref 12.0–15.0)
HEMOGLOBIN: 6.9 g/dL — AB (ref 12.0–15.0)
Hemoglobin: 9 g/dL — ABNORMAL LOW (ref 12.0–15.0)

## 2018-01-20 LAB — PREPARE RBC (CROSSMATCH)

## 2018-01-20 SURGERY — ESOPHAGOGASTRODUODENOSCOPY (EGD) WITH PROPOFOL
Anesthesia: Monitor Anesthesia Care

## 2018-01-20 MED ORDER — SODIUM CHLORIDE 0.9 % IJ SOLN
INTRAMUSCULAR | Status: AC
  Filled 2018-01-20: qty 50

## 2018-01-20 MED ORDER — SODIUM CHLORIDE 0.9 % IV SOLN
Freq: Once | INTRAVENOUS | Status: AC
  Administered 2018-01-20: 02:00:00 via INTRAVENOUS

## 2018-01-20 MED ORDER — IOPAMIDOL (ISOVUE-370) INJECTION 76%
100.0000 mL | Freq: Once | INTRAVENOUS | Status: AC | PRN
  Administered 2018-01-20: 100 mL via INTRAVENOUS

## 2018-01-20 MED ORDER — IOPAMIDOL (ISOVUE-370) INJECTION 76%
INTRAVENOUS | Status: AC
  Filled 2018-01-20: qty 100

## 2018-01-20 MED ORDER — PROPOFOL 500 MG/50ML IV EMUL
INTRAVENOUS | Status: DC | PRN
  Administered 2018-01-20: 125 ug/kg/min via INTRAVENOUS

## 2018-01-20 MED ORDER — LIDOCAINE 2% (20 MG/ML) 5 ML SYRINGE
INTRAMUSCULAR | Status: DC | PRN
  Administered 2018-01-20: 100 mg via INTRAVENOUS

## 2018-01-20 MED ORDER — PROMETHAZINE HCL 25 MG/ML IJ SOLN
25.0000 mg | Freq: Four times a day (QID) | INTRAMUSCULAR | Status: DC | PRN
  Administered 2018-01-20 – 2018-01-21 (×2): 25 mg via INTRAVENOUS
  Filled 2018-01-20 (×2): qty 1

## 2018-01-20 MED ORDER — SODIUM CHLORIDE 0.9 % IV SOLN: INTRAVENOUS | Status: DC

## 2018-01-20 MED ORDER — PROPOFOL 10 MG/ML IV BOLUS
INTRAVENOUS | Status: AC
  Filled 2018-01-20: qty 20

## 2018-01-20 MED ORDER — PROPOFOL 10 MG/ML IV BOLUS
INTRAVENOUS | Status: DC | PRN
  Administered 2018-01-20: 10 mg via INTRAVENOUS
  Administered 2018-01-20 (×2): 30 mg via INTRAVENOUS
  Administered 2018-01-20: 10 mg via INTRAVENOUS
  Administered 2018-01-20: 20 mg via INTRAVENOUS

## 2018-01-20 MED ORDER — DEXTROSE IN LACTATED RINGERS 5 % IV SOLN
INTRAVENOUS | Status: DC
  Administered 2018-01-20: 15:00:00 via INTRAVENOUS

## 2018-01-20 MED ORDER — LACTATED RINGERS IV SOLN
INTRAVENOUS | Status: DC
  Administered 2018-01-20 (×2): via INTRAVENOUS

## 2018-01-20 MED ORDER — IOPAMIDOL (ISOVUE-370) INJECTION 76%
INTRAVENOUS | Status: AC
  Administered 2018-01-20: 18:00:00
  Filled 2018-01-20: qty 100

## 2018-01-20 MED ORDER — PROPOFOL 10 MG/ML IV BOLUS
INTRAVENOUS | Status: AC
  Filled 2018-01-20: qty 40

## 2018-01-20 SURGICAL SUPPLY — 15 items

## 2018-01-20 NOTE — Progress Notes (Signed)
CRITICAL VALUE ALERT  Critical Value: Hgb 6.9  Date & Time Notied: 01/20/18 @0056   Provider Notified: [place]  Orders Received/Actions taken: 1 unit RBC

## 2018-01-20 NOTE — Interval H&P Note (Signed)
History and Physical Interval Note:  01/20/2018 8:30 AM  Sara [name]  has presented today for surgery, with the diagnosis of Hematemesis  The various methods of treatment have been discussed with the patient and family. After consideration of risks, benefits and other options for treatment, the patient has consented to  Procedure(s): ESOPHAGOGASTRODUODENOSCOPY (EGD) WITH PROPOFOL (N/A) as a surgical intervention .  The patient's history has been reviewed, patient examined, no change in status, stable for surgery.  I have reviewed the patient's chart and labs.  Questions were answered to the patient's satisfaction.     [name]

## 2018-01-20 NOTE — Consult Note (Signed)
Chief Complaint: Patient was seen in consultation today for possible TIPS/BRTO Chief Complaint  Patient presents with  . Hematemesis    Referring Physician(s): [place]  Supervising Physician: Sandi Mariscal  Patient Status: [facility] - In-pt  History of Present Illness: Sara [name] is a 42 y.o. female with history of alcoholic cirrhosis and continued alcohol use, depression, tobacco abuse, who presented to [facility] yesterday with hematemesis, upper abdominal discomfort and poor appetite.  Patient also with noted history of portal hypertension with prior variceal banding in 2015 as well as splenomegaly and thrombocytopenia.  She underwent EGD this morning with banding of 3 columns of nonbleeding large varices in the middle third /lower third of the esophagus.  There was stigmata of recent bleeding and 4 bands were successfully placed with complete eradication resulting in deflation of the varices.  She has had no recent abdominal imaging.  Current meld score is approximately 14.  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  Current labs include WBC 2.4, hemoglobin 7.7, platelets 30k, creatinine 0.41, PT 17.9, INR 1.49, potassium 4.2, total bilirubin 2.0, sodium 142.  She is still somewhat lethargic from recent endoscopy and has had  some recent alcohol withdrawal symptoms. She has received blood transfusion.  She did have a black bowel movement this morning but no further hematemesis since admission.  Past Medical History:  Diagnosis Date  . Alcohol abuse   . Alcoholism ([place])   . Anemia   . Anxiety   . Blood transfusion without reported diagnosis   . Cirrhosis ([place])   . Depression   . 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 GERD (gastroesophageal reflux disease)   . Heart murmur    Patient states she may have  . Menorrhagia   . Pancytopenia ([place]) 01/15/2014  . Pneumonia   . Portal hypertension ([place])   . S/P alcohol detoxification    2-3 days at behavioral  health previously  . UGI bleed 06/12/2014    Past Surgical History:  Procedure Laterality Date  . CHOLECYSTECTOMY    . ESOPHAGOGASTRODUODENOSCOPY N/A 06/12/2014   Procedure: ESOPHAGOGASTRODUODENOSCOPY (EGD);  Surgeon: Gatha Mayer, MD;  Location: [facility];  Service: Endoscopy;  Laterality: N/A;  . ESOPHAGOGASTRODUODENOSCOPY (EGD) WITH PROPOFOL N/A 07/29/2014   Procedure: ESOPHAGOGASTRODUODENOSCOPY (EGD) WITH PROPOFOL;  Surgeon: Inda Castle, MD;  Location: [facility];  Service: Endoscopy;  Laterality: N/A;    Allergies: Morphine and related and Nsaids  Medications: Prior to Admission medications   Medication Sig Start Date End Date Taking? Authorizing Provider  Multiple Vitamin (MULTIVITAMIN WITH MINERALS) TABS tablet Take 1 tablet by mouth daily.   Yes [provider]  QUEtiapine (SEROQUEL) 50 MG tablet Take 50 mg by mouth at bedtime. 11/10/17  Yes [provider]  thiamine (VITAMIN B-1) 100 MG tablet Take 1 tablet (100 mg total) by mouth daily. 09/11/17  Yes Duffy Bruce, MD  chlordiazePOXIDE (LIBRIUM) 25 MG capsule 50mg  PO TID x 1D, then 25-50mg  PO BID X 1D, then 25-50mg  PO QD X 1D Patient not taking: Reported on 01/19/2018 01/17/18   Montine Circle, PA-C     Family History  Problem Relation Age of Onset  . Colon polyps Mother   . Hypertension Mother   . Thyroid disease Mother   . Alcoholism Mother   . Alcoholism Father   . Alcohol abuse Maternal Grandfather   . Alcohol abuse Paternal Grandfather   . Alcohol abuse Paternal Aunt   . Alcohol abuse Maternal Uncle  Social History   Socioeconomic History  . Marital status: Married    Spouse name: [name]  . Number of children: 2  . Years of education: Associates  . Highest education level: None  Social Needs  . Financial resource strain: None  . Food insecurity - worry: None  . Food insecurity - inability: None  . Transportation needs - medical: None  . Transportation needs -  non-medical: None  Occupational History  . Occupation: Paralegal  Tobacco Use  . Smoking status: Current Every Day Smoker    Packs/day: 0.25    Types: Cigarettes  . Smokeless tobacco: Never Used  Substance and Sexual Activity  . Alcohol use: Yes    Alcohol/week: 0.0 oz    Comment: Usually drinks 2-3 bottles of wine daily when drinking.   . Drug use: No    Comment: denies  . Sexual activity: No    Birth control/protection: None  Other Topics Concern  . None  Social History Narrative   Lives with husband and son. Daughter lives nearby. 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      Review of Systems see above; currently denies fever, headache, chest pain, dyspnea, cough, back pain, vomiting  Vital Signs: BP 110/69   Pulse 85   Temp 98.5 F (36.9 C) (Oral)   Resp 11   Ht 5\' 4"  (1.626 m)   Wt 154 lb 15.7 oz (70.3 kg)   SpO2 93%   BMI 26.60 kg/m   Physical Exam slightly lethargic and yawning; chest clear to auscultation bilaterally.  Heart with regular rate and rhythm.  Abdomen soft, positive bowel sounds, tender epigastric/upper abd regions  to palpation; no lower extremity edema  Imaging: Dg Chest 2 View  Result Date: 01/16/2018 CLINICAL DATA:  Chest pain beginning at 6 a.m. this morning with shortness of breath and nausea. EXAM: CHEST  2 VIEW COMPARISON:  PA and lateral chest 10/24/2015 and 11/04/2014. FINDINGS: Lung volumes are low with crowding of the bronchovascular structures. Heart size is upper normal. No pneumothorax or pleural effusion. No bony abnormality. IMPRESSION: No acute disease. Electronically Signed   By: Inge Rise M.D.   On: 01/16/2018 14:36    Labs:  CBC: Recent Labs    01/16/18 1401 01/16/18 2107 01/19/18 1417 01/19/18 1829 01/20/18 0004 01/20/18 0601  WBC 4.0 3.0* 4.2  --   --  2.4*  HGB 12.5 11.5* 9.9* 8.4* 6.9* 7.7*  HCT 37.4 34.5* 29.4* 24.7* 20.2* 22.8*  PLT 46* 32* 27*  --   --  30*    COAGS: Recent  Labs    02/10/17 1555 05/04/17 0634 01/16/18 1556 01/20/18 0601  INR 1.23 1.28 1.20 1.49  APTT  --   --  36  --     BMP: Recent Labs    12/19/17 1214 01/16/18 1401 01/19/18 1417 01/20/18 0601  NA 144 147* 142 142  K 3.4* 3.7 4.0 4.2  CL 114* 110 107 109  CO2 22 25 22 23   GLUCOSE 87 102* 121* 101*  BUN 8 6 15 12   CALCIUM 8.1* 8.8* 7.7* 7.0*  CREATININE 0.38* 0.39* 0.45 0.41*  GFRNONAA >60 >60 >60 >60  GFRAA >60 >60 >60 >60    LIVER FUNCTION TESTS: Recent Labs    12/19/17 1214 01/16/18 1401 01/19/18 1417 01/20/18 0601  BILITOT 1.2 1.8* 1.9* 2.0*  AST 50* 196* 153* 118*  ALT 29 60* 52 43  ALKPHOS 96 123 106 81  PROT 7.5 7.8 6.4*  5.3*  ALBUMIN 3.9 3.8 3.2* 2.8*    TUMOR MARKERS: No results for input(s): AFPTM, CEA, CA199, CHROMGRNA in the last 8760 hours.  Assessment and Plan: 42 y.o. female with history of alcoholic cirrhosis and continued alcohol use, depression, tobacco abuse, who presented to [facility] yesterday with hematemesis, upper abdominal discomfort and poor appetite.  Patient also with noted history of portal hypertension with prior variceal banding in 2015 as well as splenomegaly and thrombocytopenia.  She underwent EGD this morning with banding of 3 columns of nonbleeding large varices in the middle third /lower third of the esophagus.  There was stigmata of recent bleeding and 4 bands were successfully placed with complete eradication resulting in deflation of the varices.  She has had no recent abdominal imaging.  Current meld score is approximately 14.  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  Current labs include WBC 2.4, hemoglobin 7.7, platelets 30k, creatinine 0.41, PT 17.9, INR 1.49, potassium 4.2, total bilirubin 2.0, sodium 142.  Case discussed with Dr. Pascal Lux.  Recommend CT A/P BRTO protocol for further evaluation and to assist with determining next phase of care.  Patient is currently hemodynamically stable.  Continue to monitor for now  and  monitor alcohol withdrawal symptoms.  Currently on Rocephin , Protonix and octreotide.  Should endovascular therapy become necessary procedure(s) would be performed at [facility]. Above plans briefly discussed with pt.   Thank you for this interesting consult.  I greatly enjoyed meeting Sara [name] and look forward to participating in their care.  A copy of this report was sent to the requesting provider on this date.  Electronically Signed: D. Rowe Robert, PA-C 01/20/2018, 12:08 PM   I spent a total of  30 minutes   in face to face in clinical consultation, greater than 50% of which was counseling/coordinating care for possible TIPS/BRTO

## 2018-01-20 NOTE — Progress Notes (Signed)
PROGRESS NOTE    Sara [name]  BHA:[id number] DOB: [birth year] DOA: 01/19/2018 PCP: Patient, No Pcp Per    Brief Narrative:  42 year old female who presented with hematemesis.  She does have a significant past medical history of alcohol abuse, alcoholic cirrhosis, portal hypertension and history of esophageal varices with variceal bleed in 2015.  Patient had an acute episode of hematemesis, approximately a bowl full.  On the initial physical examination she was tachycardic 127 bpm, blood pressure 112/76, temperature 99.2, respiratory 18, oximetry 98%.  Patient was awake and alert, heart S1-S2 present rhythmic tachycardic, lungs clear to auscultation bilaterally, her abdomen was tender in epigastric region, no guarding, no rebound no peritoneal signs, no lower extremity edema.  Sodium 142, potassium 4.0, chloride 107, bicarb 20, glucose 121, BUN 15, creatinine 0.45, calcium 7.7, magnesium 1.5, AST 153, ALT 52, total bilirubin 1.9, white count 4.2, hemoglobin 9.9, hematocrit 39.4, platelets 27.  CT head, neck and maxillofacial negative for acute changes.  Patient was admitted to the hospital with the working diagnosis of acute upper GI bleed.   Assessment & Plan:   Active Problems:   Hematemesis   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[place])   1. Acute blood loss anemia due to upper GI bleed, esophageal varices. Patient with improved hemodynamics, heart rate down to 90 bpm, with systolic blood pressure 973. Transfuse second unit today of PRBC (#2) and follow on H&H q 6 hours x3. SP endoscopic esophageal variceal banding. Continue antiacid therapy with pantoprazole, and as needed antiemetics. Continue prophylactic antibiotic therapy with ceftriaxone IV. Octreotide infusion. Gentle hydration with isotonic saline. Will keep NPO today except for ice chips and sips of water.  2. Alcoholic liver cirrhosis. Positive telangiectasias, suggesting advanced liver disease, advance diet to clears, will  continue neuro checks per unit protocol, no signs of encephalopathy. Follow on CT abdomen and pelvis, evaluation for possible TIPS.   3. Hypomagnesemia. Replete Mg, renal function stable, will follow on electrolytes in am.   4.Etho abuse. Continue neuro checks per unit protocol, no current clinical signs of withdrawal. Continue lorazepam per CIWA protocol. IV thiamine.    DVT prophylaxis: scd  Code Status:  full Family Communication: I spoke with patient's mother at the bedside and all questions were addressed.  Disposition Plan: home   Consultants:   GI  Procedures:   Upper endoscopy   Antimicrobials:      Subjective: Patient sp endoscopic procedure, somnolent but responding to questions, no nausea or vomiting, no abdominal pain or dyspnea, no further bleeding.   Objective: Vitals:   01/20/18 0953 01/20/18 0959 01/20/18 1100 01/20/18 1135  BP: 107/73 114/67 105/63   Pulse: 90 85    Resp: (!) 24 18 (!) 0   Temp:    99 F (37.2 C)  TempSrc:    Oral  SpO2: 97% 98% 96%   Weight:      Height:        Intake/Output Summary (Last 24 hours) at 01/20/2018 1149 Last data filed at 01/20/2018 5329 Gross per 24 hour  Intake 2113.4 ml  Output -  Net 2113.4 ml   Filed Weights   01/19/18 1804  Weight: 70.3 kg (154 lb 15.7 oz)    Examination:   General: Not in pain or dyspnea, deconditioned Neurology: Awake and alert, non focal  E ENT: mild pallor, no icterus, oral mucosa moist Cardiovascular: No JVD. S1-S2 present, rhythmic, no gallops, rubs, or murmurs. No lower extremity edema. Pulmonary: decreased breath sounds bilaterally, poor inspiratory  effort, adequate air movement, no wheezing, rhonchi or rales. Gastrointestinal. Abdomen distended, no ascites, no organomegaly, non tender, no rebound or guarding Skin. No rashes Musculoskeletal: no joint deformities     Data Reviewed: I have personally reviewed following labs and imaging studies  CBC: Recent Labs  Lab  01/16/18 1401 01/16/18 2107 01/19/18 1417 01/19/18 1829 01/20/18 0004 01/20/18 0601  WBC 4.0 3.0* 4.2  --   --  2.4*  NEUTROABS  --  1.1  --   --   --   --   HGB 12.5 11.5* 9.9* 8.4* 6.9* 7.7*  HCT 37.4 34.5* 29.4* 24.7* 20.2* 22.8*  MCV 100.0 100.6* 99.7  --   --  99.6  PLT 46* 32* 27*  --   --  30*   Basic Metabolic Panel: Recent Labs  Lab 01/16/18 1401 01/19/18 1417 01/20/18 0601  NA 147* 142 142  K 3.7 4.0 4.2  CL 110 107 109  CO2 25 22 23   GLUCOSE 102* 121* 101*  BUN 6 15 12   CREATININE 0.39* 0.45 0.41*  CALCIUM 8.8* 7.7* 7.0*  MG  --  1.5*  --    GFR: Estimated Creatinine Clearance: 89.9 mL/min (A) (by C-G formula based on SCr of 0.41 mg/dL (L)). Liver Function Tests: Recent Labs  Lab 01/16/18 1401 01/19/18 1417 01/20/18 0601  AST 196* 153* 118*  ALT 60* 52 43  ALKPHOS 123 106 81  BILITOT 1.8* 1.9* 2.0*  PROT 7.8 6.4* 5.3*  ALBUMIN 3.8 3.2* 2.8*   Recent Labs  Lab 01/19/18 1417  LIPASE 42   No results for input(s): AMMONIA in the last 168 hours. Coagulation Profile: Recent Labs  Lab 01/16/18 1556 01/20/18 0601  INR 1.20 1.49   Cardiac Enzymes: No results for input(s): CKTOTAL, CKMB, CKMBINDEX, TROPONINI in the last 168 hours. BNP (last 3 results) No results for input(s): PROBNP in the last 8760 hours. HbA1C: No results for input(s): HGBA1C in the last 72 hours. CBG: No results for input(s): GLUCAP in the last 168 hours. Lipid Profile: No results for input(s): CHOL, HDL, LDLCALC, TRIG, CHOLHDL, LDLDIRECT in the last 72 hours. Thyroid Function Tests: No results for input(s): TSH, T4TOTAL, FREET4, T3FREE, THYROIDAB in the last 72 hours. Anemia Panel: No results for input(s): VITAMINB12, FOLATE, FERRITIN, TIBC, IRON, RETICCTPCT in the last 72 hours.    Radiology Studies: I have reviewed all of the imaging during this hospital visit personally     Scheduled Meds: . chlorhexidine  15 mL Mouth Rinse BID  . mouth rinse  15 mL Mouth  Rinse q12n4p  . pantoprazole (PROTONIX) IV  40 mg Intravenous Q12H  . thiamine  100 mg Intravenous Daily   Continuous Infusions: . cefTRIAXone (ROCEPHIN)  IV    . octreotide  (SANDOSTATIN)    IV infusion Stopped (01/20/18 0904)     LOS: 1 day        Mauricio Gerome Apley, MD [facility] Pager [phone]

## 2018-01-20 NOTE — Transfer of Care (Signed)
Immediate Anesthesia Transfer of Care Note  Patient: Sara [name]  Procedure(s) Performed: ESOPHAGOGASTRODUODENOSCOPY (EGD) WITH PROPOFOL (N/A )  Patient Location: PACU  Anesthesia Type:MAC  Level of Consciousness: awake, alert  and oriented  Airway & Oxygen Therapy: Patient Spontanous Breathing and Patient connected to nasal cannula oxygen  Post-op Assessment: Report given to RN and Post -op Vital signs reviewed and stable  Post vital signs: Reviewed and stable  Last Vitals:  Vitals:   01/20/18 0700 01/20/18 0750  BP: (!) 98/59 110/67  Pulse: 93 100  Resp: 19 17  Temp:  37 C  SpO2: 98% 97%    Last Pain:  Vitals:   01/20/18 0750  TempSrc: Oral  PainSc:          Complications: No apparent anesthesia complications

## 2018-01-20 NOTE — Op Note (Signed)
[facility] Patient Name: Sara [name] Procedure Date: 01/20/2018 MRN: [id number] Attending MD: Mauri Pole , MD Date of Birth: [birth year] CSN: [id number] Age: 42 Admit Type: Inpatient Procedure:                Upper GI endoscopy Indications:              Active gastrointestinal bleeding, Suspected upper                            gastrointestinal bleeding, Hematemesis Providers:                Mauri Pole, MD, Burtis Junes, RN, Cherylynn Ridges, Technician, [place] [address]. Zenia Resides CRNA, CRNA Referring MD:              Medicines:                Monitored Anesthesia Care Complications:            No immediate complications. Estimated Blood Loss:     Estimated blood loss was minimal. Procedure:                Pre-Anesthesia Assessment:                           - Prior to the procedure, a History and Physical                            was performed, and patient medications and                            allergies were reviewed. The patient's tolerance of                            previous anesthesia was also reviewed. The risks                            and benefits of the procedure and the sedation                            options and risks were discussed with the patient.                            All questions were answered, and informed consent                            was obtained. Prior Anticoagulants: The patient has                            taken no previous anticoagulant or antiplatelet                            agents. ASA Grade Assessment: IV - A patient with  severe systemic disease that is a constant threat                            to life. After reviewing the risks and benefits,                            the patient was deemed in satisfactory condition to                            undergo the procedure.                           After obtaining informed consent, the endoscope was                     passed under direct vision. Throughout the                            procedure, the patient's blood pressure, pulse, and                            oxygen saturations were monitored continuously. The                            Endoscope was introduced through the mouth, and                            advanced to the second part of duodenum. The upper                            GI endoscopy was accomplished without difficulty.                            The patient tolerated the procedure well. Scope In: Scope Out: Findings:      Three columns of non-bleeding large (> 5 mm) varices were found in the       middle third of the esophagus and in the lower third of the esophagus,       36 cm from the incisors. They were 7 mm in largest diameter. Stigmata of       recent bleeding were evident and red wale signs were present. Four bands       were successfully placed with complete eradication, resulting in       deflation of varices. Bleeding had stopped at the end of the procedure.      Moderate portal hypertensive gastropathy was found in the entire       examined stomach.      The examined duodenum was normal. Impression:               - Recently bleeding large (> 5 mm) esophageal                            varices. Completely eradicated. Banded.                           - Portal hypertensive gastropathy.                           -  Normal examined duodenum.                           - No specimens collected. Moderate Sedation:      N/A- Per Anesthesia Care Recommendation:           - NPO today except for ice chips and sips of water,                            then advance as tolerated to clear liquid diet                            tomorrow.                           - Continue present medications.                           - Continue Octreotide gtt for 72 hours                           - Continue Ceftriaxone for 5 days for SBP                            prophylaxis                            - Consult IR for possible early TIPS to prevent                            recurrent variceal hemorrhage. ([place] C, Child pugh                            class B) Procedure Code(s):        --- Professional ---                           [phone], Esophagogastroduodenoscopy, flexible,                            transoral; with band ligation of esophageal/gastric                            varices Diagnosis Code(s):        --- Professional ---                           I85.01, Esophageal varices with bleeding                           K76.6, Portal hypertension                           K31.89, Other diseases of stomach and duodenum                           K92.2, Gastrointestinal hemorrhage, unspecified  K92.0, Hematemesis CPT copyright 2016 [employer]. All rights reserved. The codes documented in this report are preliminary and upon coder review may  be revised to meet current compliance requirements. Mauri Pole, MD 01/20/2018 9:51:49 AM This report has been signed electronically. Number of Addenda: 0

## 2018-01-20 NOTE — Anesthesia Postprocedure Evaluation (Signed)
Anesthesia Post Note  Patient: Kalyna Paolella  Procedure(s) Performed: ESOPHAGOGASTRODUODENOSCOPY (EGD) WITH PROPOFOL (N/A )     Patient location during evaluation: PACU Anesthesia Type: MAC Level of consciousness: awake Pain management: pain level controlled Vital Signs Assessment: post-procedure vital signs reviewed and stable Respiratory status: spontaneous breathing, nonlabored ventilation, respiratory function stable and patient connected to nasal cannula oxygen Cardiovascular status: stable and blood pressure returned to baseline Postop Assessment: no apparent nausea or vomiting Anesthetic complications: no    Last Vitals:  Vitals:   01/20/18 1300 01/20/18 1348  BP: 118/69 127/75  Pulse: 90 90  Resp: (!) 21 20  Temp:  36.9 C  SpO2: 93% 94%    Last Pain:  Vitals:   01/20/18 1348  TempSrc: Oral  PainSc:                  Ryan P Ellender

## 2018-01-20 NOTE — Progress Notes (Cosign Needed)
Place consult for IR to see patient sometime over the weekend to evaluate for TIPS.

## 2018-01-21 LAB — BASIC METABOLIC PANEL
Anion gap: 6 (ref 5–15)
BUN: 9 mg/dL (ref 6–20)
CHLORIDE: 106 mmol/L (ref 101–111)
CO2: 26 mmol/L (ref 22–32)
CREATININE: 0.48 mg/dL (ref 0.44–1.00)
Calcium: 7.7 mg/dL — ABNORMAL LOW (ref 8.9–10.3)
GFR calc Af Amer: >60 mL/min (ref >60)
GFR calc non Af Amer: >60 mL/min (ref >60)
Glucose, Bld: 108 mg/dL — ABNORMAL HIGH (ref 65–99)
Potassium: 3.7 mmol/L (ref 3.5–5.1)
SODIUM: 138 mmol/L (ref 135–145)

## 2018-01-21 LAB — CBC WITH DIFFERENTIAL/PLATELET
Basophils Absolute: 0 10*3/uL (ref 0.0–0.1)
Basophils Relative: 1 %
EOS ABS: 0 10*3/uL (ref 0.0–0.7)
Eosinophils Relative: 2 %
HCT: 25.9 % — ABNORMAL LOW (ref 36.0–46.0)
HEMOGLOBIN: 9 g/dL — AB (ref 12.0–15.0)
LYMPHS ABS: 0.7 10*3/uL (ref 0.7–4.0)
Lymphocytes Relative: 35 %
MCH: 33.2 pg (ref 26.0–34.0)
MCHC: 34.7 g/dL (ref 30.0–36.0)
MCV: 95.6 fL (ref 78.0–100.0)
MONO ABS: 0.2 10*3/uL (ref 0.1–1.0)
MONOS PCT: 9 %
Neutro Abs: 1 10*3/uL — ABNORMAL LOW (ref 1.7–7.7)
Neutrophils Relative %: 53 %
Platelets: 27 10*3/uL — CL (ref 150–400)
RBC: 2.71 MIL/uL — ABNORMAL LOW (ref 3.87–5.11)
RDW: 16.8 % — AB (ref 11.5–15.5)
WBC: 2 10*3/uL — ABNORMAL LOW (ref 4.0–10.5)

## 2018-01-21 LAB — TYPE AND SCREEN
ABO/RH(D): A POS
Antibody Screen: NEGATIVE
Unit division: 0
Unit division: 0

## 2018-01-21 LAB — BPAM RBC
Blood Product Expiration Date: 201903092359
Blood Product Expiration Date: 201903092359
ISSUE DATE / TIME: 201903020117
ISSUE DATE / TIME: 201903021139
Unit Type and Rh: 600
Unit Type and Rh: 600

## 2018-01-21 LAB — MAGNESIUM: Magnesium: 1.9 mg/dL (ref 1.7–2.4)

## 2018-01-21 MED ORDER — TAB-A-VITE/IRON PO TABS
1.0000 | ORAL_TABLET | Freq: Every day | ORAL | Status: DC
  Administered 2018-01-21 – 2018-01-23 (×3): 1 via ORAL
  Filled 2018-01-21 (×3): qty 1

## 2018-01-21 MED ORDER — VITAMIN B-1 100 MG PO TABS
100.0000 mg | ORAL_TABLET | Freq: Every day | ORAL | Status: DC
  Administered 2018-01-21 – 2018-01-23 (×3): 100 mg via ORAL
  Filled 2018-01-21 (×3): qty 1

## 2018-01-21 MED ORDER — POTASSIUM CHLORIDE CRYS ER 20 MEQ PO TBCR
40.0000 meq | EXTENDED_RELEASE_TABLET | Freq: Once | ORAL | Status: AC
  Administered 2018-01-21: 40 meq via ORAL
  Filled 2018-01-21: qty 2

## 2018-01-21 MED ORDER — MAGNESIUM OXIDE 400 (241.3 MG) MG PO TABS
400.0000 mg | ORAL_TABLET | Freq: Three times a day (TID) | ORAL | Status: DC
  Administered 2018-01-21 – 2018-01-23 (×7): 400 mg via ORAL
  Filled 2018-01-21 (×7): qty 1

## 2018-01-21 MED ORDER — ACETAMINOPHEN 325 MG PO TABS
650.0000 mg | ORAL_TABLET | Freq: Four times a day (QID) | ORAL | Status: DC | PRN
  Administered 2018-01-21: 650 mg via ORAL
  Filled 2018-01-21: qty 2

## 2018-01-21 NOTE — Progress Notes (Signed)
[facility] ROUNDING NOTE   Subjective: Drowsy, answers appropriately. No BM since admission.    Objective: Vital signs in last 24 hours: Temp:  [98.1 F (36.7 C)-99 F (37.2 C)] 98.5 F (36.9 C) (03/03 1124) Pulse Rate:  [66-102] 78 (03/03 0600) Resp:  [11-28] 23 (03/03 0600) BP: (89-156)/(49-112) 112/68 (03/03 0600) SpO2:  [89 %-98 %] 92 % (03/03 0600) Last BM Date: 01/20/18 General: NAD Heart: s1s2  Abdomen: soft, Non tender, no distension Ext: edema    Intake/Output from previous day: 03/02 0701 - 03/03 0700 In: 2586.8 [I.V.:2216.8; Blood:370] Out: 1200 [Urine:1200] Intake/Output this shift: No intake/output data recorded.   Lab Results: Recent Labs    01/19/18 1417  01/20/18 0601 01/20/18 1615 01/20/18 2022 01/21/18 0204  WBC 4.2  --  2.4*  --   --  2.0*  HGB 9.9*   < > 7.7* 9.0* 9.0* 9.0*  PLT 27*  --  30*  --   --  27*  MCV 99.7  --  99.6  --   --  95.6   < > = values in this interval not displayed.   BMET Recent Labs    01/19/18 1417 01/20/18 0601 01/21/18 0204  NA 142 142 138  K 4.0 4.2 3.7  CL 107 109 106  CO2 22 23 26   GLUCOSE 121* 101* 108*  BUN 15 12 9   CREATININE 0.45 0.41* 0.48  CALCIUM 7.7* 7.0* 7.7*   LFT Recent Labs    01/19/18 1417 01/20/18 0601  PROT 6.4* 5.3*  ALBUMIN 3.2* 2.8*  AST 153* 118*  ALT 52 43  ALKPHOS 106 81  BILITOT 1.9* 2.0*   PT/INR Recent Labs    01/20/18 0601  INR 1.49      Imaging/Other results: Ct Abdomen Pelvis W Contrast  Result Date: 01/21/2018 CLINICAL DATA:  History of alcoholic cirrhosis, now with esophageal varices, successfully treated endoscopically. Please perform BRT0 protocol CT scan to evaluate for anatomy for potential TIPS/BRT0 intervention. EXAM: CTA ABDOMEN AND PELVIS WITHOUT AND WITH CONTRAST TECHNIQUE: Multidetector CT imaging of the abdomen and pelvis was performed using the standard protocol during bolus administration of intravenous contrast. Multiplanar  reconstructed images and MIPs were obtained and reviewed to evaluate the vascular anatomy. CONTRAST:  113mL ISOVUE-370 IOPAMIDOL (ISOVUE-370) INJECTION 76% COMPARISON:  CT abdomen pelvis - 07/28/2015 FINDINGS: VASCULAR Aorta: The significant atherosclerotic plaque within a normal caliber abdominal aorta. No abdominal aortic dissection or periaortic stranding. Celiac: Widely patent.  Conventional branching pattern. SMA: Widely patent.  Conventional branching pattern. Renals: Duplicated renal arteries bilaterally. All renal arteries appear widely patent without hemodynamically significant narrowing. No vessel irregularity to suggest FMD. IMA: Widely patent. Inflow: The bilateral common, external and internal iliac arteries are of normal caliber and widely patent without hemodynamically significant stenosis. Proximal Outflow: The bilateral common and imaged portions of the bilateral deep and superficial femoral arteries are of normal caliber and widely patent without hemodynamically significant narrowing. Veins: The pelvic venous system and IVC appear widely patent. The portal vein is widely patent. The main trunk of the right portal vein is noted to be diminutive with early bifurcation into the anterior and posterior divisions. Conventional configuration of the left portal vein. Note is made of a common origin the left and middle hepatic veins (images 12, 13 and 15, series 9). Note is made of recanalization of the periumbilical veins. Note is made of a two adjacent splenorenal shunts (coronal images 59 and 62, series 6) with development of multiple markedly  hypertrophied varicosities within the left upper abdominal quadrant. These varicosities do not definitively result in hypertrophied intraluminal gastric varices. Note is made of several tiny distal esophageal varices. Review of the MIP images confirms the above findings. NON-VASCULAR Lower chest: Limited visualization of the lower thorax demonstrates minimal  dependent subpleural ground-glass atelectasis. No pleural effusion. Normal heart size. No pericardial effusion. Hepatobiliary: Marked nodularity of the hepatic contour compatible with provided history of cirrhosis. No discrete hyperenhancing hepatic lesions. There is a small amount perihepatic and perisplenic ascites. Post cholecystectomy. The common bile duct is dilated measuring 1 cm in diameter (coronal image 52, series 6), presumably the sequela post cholecystectomy state. No intrahepatic biliary ductal dilatation. Pancreas: Normal appearance of the pancreas Spleen: Spleen is enlarged measuring approximately 15 cm in length (coronal image 64, series 6). Adrenals/Urinary Tract: There is symmetric enhancement and excretion of the bilateral kidneys. No renal stones. No urine obstruction or perinephric stranding. Normal appearance of the urinary bladder given degree distention. Normal appearance the bilateral adrenal glands. Stomach/Bowel: As above, as above, note is made tiny paraesophageal varices. No definitive gastric varices. Bowel is normal in course and caliber without wall thickening or evidence of enteric obstruction. No pneumoperitoneum, pneumatosis or portal venous gas. Lymphatic: No bulky retroperitoneal, mesenteric, porta hepatis, pelvic or inguinal lymphadenopathy. Reproductive: Normal appearance of the pelvic organs. No discrete adnexal lesion. No free fluid in the pelvic cul-de-sac. Other: Mild diffuse body wall anasarca, most conspicuous about the midline of the low back. Musculoskeletal: No acute or aggressive osseous abnormalities. IMPRESSION: 1. 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 Tiny distal esophageal varices without definitive hypertrophied intraluminal gastric varices. 2. No discrete hyperenhancing hepatic lesions to  suggest hepatocellular carcinoma. Electronically Signed   By: Sandi Mariscal M.D.   On: 01/21/2018 08:59      Assessment &Plan 51 yr F with history alcoholic cirrhosis, ongoing alcohol use, decompensated with esophageal variceal hemorrhage s/p EGD with esophageal variceal band ligation.  MELD 14, child pugh B. Discriminant function score <32, no indication to start steroids Patient is currently actively withdrawing from alcohol Clear liquids and advance slowly to soft diet Continue Ceftriaxone for 5 days Continue Octreotide for 72 hours and start nadolol 20 mg daily after completion of Octreotide Appreciate IR consult, plan for elective early TIPS to decrease portal hypertension and recurrent variceal hemorrhage. F/u CT abd BRTO protocol    Raliegh Ip Denzil Magnuson , MD [phone] Mon-Fri 8a-5p [phone] after 5p, weekends, holidays [facility]

## 2018-01-21 NOTE — Progress Notes (Signed)
PROGRESS NOTE    Sara [name]  FBP:[id number] DOB: [birth year] DOA: 01/19/2018 PCP: Patient, No Pcp Per    Brief Narrative:  42 year old female who presented with hematemesis.  She does have a significant past medical history of alcohol abuse, alcoholic cirrhosis, portal hypertension and history of esophageal varices with variceal bleed in 2015.  Patient had an acute episode of hematemesis, approximately a bowl full.  On the initial physical examination she was tachycardic 127 bpm, blood pressure 112/76, temperature 99.2, respiratory 18, oximetry 98%.  Patient was awake and alert, heart S1-S2 present rhythmic tachycardic, lungs clear to auscultation bilaterally, her abdomen was tender in epigastric region, no guarding, no rebound no peritoneal signs, no lower extremity edema.  Sodium 142, potassium 4.0, chloride 107, bicarb 20, glucose 121, BUN 15, creatinine 0.45, calcium 7.7, magnesium 1.5, AST 153, ALT 52, total bilirubin 1.9, white count 4.2, hemoglobin 9.9, hematocrit 39.4, platelets 27.  CT head, neck and maxillofacial negative for acute changes.  Patient was admitted to the hospital with the working diagnosis of acute upper GI bleed.      Assessment & Plan:   Active Problems:   Hematemesis   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[place])   1. Acute blood loss anemia due to upper GI bleed, esophageal varices. SP 2 units PRBC transfusion. Patient hemodynamically stable, will continue telemetry monitoring, no further signs of bleeding. Varices have been banded. Will advance diet to clears. Continue antiacid therapy with proton pump inhibitors, continue octreotide per protocol and prophylactic IV ceftriaxone.   2. Alcoholic liver cirrhosis. Positive coagulopathy with INR at 1,49, no encephalopathy. CT abdomen result pending. Scan from 2016 reported cirrhosis of the liver with signs of portal hypertension.   3. Hypomagnesemia and hypokalemia. Will continue Mg correction with oral mag  oxide, renal function continue to be stable with serum cr at 0.48. Serum K 3,7 will give 40 meq po Kcl x1.   4.Etho abuse. On lorazepam per CIWA protocol. No signs of withdrawal, will change thiamine to po.    DVT prophylaxis: scd  Code Status:  full Family Communication: no family at the bedside Disposition Plan: home   Consultants:   GI  Procedures:   Upper endoscopy   Antimicrobials:     Subjective: Patient with mild chest pain, no nausea or vomiting, no further hematemesis or melena. No dyspnea.   Objective: Vitals:   01/21/18 0403 01/21/18 0500 01/21/18 0600 01/21/18 0800  BP:  (!) 89/49 112/68   Pulse:  68 78   Resp:  (!) 22 (!) 23   Temp: 98.1 F (36.7 C)   98.7 F (37.1 C)  TempSrc: Oral   Oral  SpO2:  93% 92%   Weight:      Height:        Intake/Output Summary (Last 24 hours) at 01/21/2018 0831 Last data filed at 01/21/2018 0600 Gross per 24 hour  Intake 2586.82 ml  Output 1200 ml  Net 1386.82 ml   Filed Weights   01/19/18 1804  Weight: 70.3 kg (154 lb 15.7 oz)    Examination:   General: deconditioned Neurology: Awake and alert, non focal  E ENT: mild pallor, no icterus, oral mucosa moist Cardiovascular: No JVD. S1-S2 present, rhythmic, no gallops, rubs, or murmurs. No lower extremity edema. Pulmonary: decreased  breath sounds bilaterally, poor inspiratory effort, no wheezing, rhonchi or rales. Gastrointestinal. Abdomen with no organomegaly, non tender, no rebound or guarding Skin. Chest telangiectasias Musculoskeletal: no joint deformities     Data Reviewed:  I have personally reviewed following labs and imaging studies  CBC: Recent Labs  Lab 01/16/18 1401 01/16/18 2107 01/19/18 1417  01/20/18 0004 01/20/18 0601 01/20/18 1615 01/20/18 2022 01/21/18 0204  WBC 4.0 3.0* 4.2  --   --  2.4*  --   --  2.0*  NEUTROABS  --  1.1  --   --   --   --   --   --  1.0*  HGB 12.5 11.5* 9.9*   < > 6.9* 7.7* 9.0* 9.0* 9.0*  HCT 37.4 34.5*  29.4*   < > 20.2* 22.8* 26.1* 26.2* 25.9*  MCV 100.0 100.6* 99.7  --   --  99.6  --   --  95.6  PLT 46* 32* 27*  --   --  30*  --   --  27*   < > = values in this interval not displayed.   Basic Metabolic Panel: Recent Labs  Lab 01/16/18 1401 01/19/18 1417 01/20/18 0601 01/21/18 0204  NA 147* 142 142 138  K 3.7 4.0 4.2 3.7  CL 110 107 109 106  CO2 25 22 23 26   GLUCOSE 102* 121* 101* 108*  BUN 6 15 12 9   CREATININE 0.39* 0.45 0.41* 0.48  CALCIUM 8.8* 7.7* 7.0* 7.7*  MG  --  1.5*  --  1.9   GFR: Estimated Creatinine Clearance: 89.9 mL/min (by C-G formula based on SCr of 0.48 mg/dL). Liver Function Tests: Recent Labs  Lab 01/16/18 1401 01/19/18 1417 01/20/18 0601  AST 196* 153* 118*  ALT 60* 52 43  ALKPHOS 123 106 81  BILITOT 1.8* 1.9* 2.0*  PROT 7.8 6.4* 5.3*  ALBUMIN 3.8 3.2* 2.8*   Recent Labs  Lab 01/19/18 1417  LIPASE 42   No results for input(s): AMMONIA in the last 168 hours. Coagulation Profile: Recent Labs  Lab 01/16/18 1556 01/20/18 0601  INR 1.20 1.49   Cardiac Enzymes: No results for input(s): CKTOTAL, CKMB, CKMBINDEX, TROPONINI in the last 168 hours. BNP (last 3 results) No results for input(s): PROBNP in the last 8760 hours. HbA1C: No results for input(s): HGBA1C in the last 72 hours. CBG: No results for input(s): GLUCAP in the last 168 hours. Lipid Profile: No results for input(s): CHOL, HDL, LDLCALC, TRIG, CHOLHDL, LDLDIRECT in the last 72 hours. Thyroid Function Tests: No results for input(s): TSH, T4TOTAL, FREET4, T3FREE, THYROIDAB in the last 72 hours. Anemia Panel: No results for input(s): VITAMINB12, FOLATE, FERRITIN, TIBC, IRON, RETICCTPCT in the last 72 hours.    Radiology Studies: I have reviewed all of the imaging during this hospital visit personally     Scheduled Meds: . chlorhexidine  15 mL Mouth Rinse BID  . mouth rinse  15 mL Mouth Rinse q12n4p  . pantoprazole (PROTONIX) IV  40 mg Intravenous Q12H  . thiamine  100  mg Intravenous Daily   Continuous Infusions: . cefTRIAXone (ROCEPHIN)  IV Stopped (01/20/18 1831)  . dextrose 5% lactated ringers 50 mL/hr at 01/21/18 0600  . octreotide  (SANDOSTATIN)    IV infusion 50 mcg/hr (01/21/18 0600)     LOS: 2 days        Mauricio Gerome Apley, MD [facility] Pager [phone]

## 2018-01-22 ENCOUNTER — Encounter (HOSPITAL_COMMUNITY): Payer: Self-pay | Admitting: Gastroenterology

## 2018-01-22 DIAGNOSIS — I8510 Secondary esophageal varices without bleeding: Secondary | ICD-10-CM

## 2018-01-22 LAB — COMPREHENSIVE METABOLIC PANEL
ALK PHOS: 86 U/L (ref 38–126)
ALT: 36 U/L (ref 14–54)
AST: 83 U/L — ABNORMAL HIGH (ref 15–41)
Albumin: 2.8 g/dL — ABNORMAL LOW (ref 3.5–5.0)
Anion gap: 7 (ref 5–15)
BILIRUBIN TOTAL: 1.8 mg/dL — AB (ref 0.3–1.2)
BUN: 6 mg/dL (ref 6–20)
CALCIUM: 7.8 mg/dL — AB (ref 8.9–10.3)
CO2: 24 mmol/L (ref 22–32)
CREATININE: 0.46 mg/dL (ref 0.44–1.00)
Chloride: 106 mmol/L (ref 101–111)
Glucose, Bld: 87 mg/dL (ref 65–99)
Potassium: 3.6 mmol/L (ref 3.5–5.1)
Sodium: 137 mmol/L (ref 135–145)
Total Protein: 5.7 g/dL — ABNORMAL LOW (ref 6.5–8.1)

## 2018-01-22 LAB — CBC WITH DIFFERENTIAL/PLATELET
Basophils Absolute: 0 10*3/uL (ref 0.0–0.1)
Basophils Relative: 1 %
EOS PCT: 2 %
Eosinophils Absolute: 0 10*3/uL (ref 0.0–0.7)
HEMATOCRIT: 27.4 % — AB (ref 36.0–46.0)
HEMOGLOBIN: 9.4 g/dL — AB (ref 12.0–15.0)
LYMPHS ABS: 0.8 10*3/uL (ref 0.7–4.0)
LYMPHS PCT: 41 %
MCH: 33 pg (ref 26.0–34.0)
MCHC: 34.3 g/dL (ref 30.0–36.0)
MCV: 96.1 fL (ref 78.0–100.0)
Monocytes Absolute: 0.2 10*3/uL (ref 0.1–1.0)
Monocytes Relative: 12 %
NEUTROS ABS: 0.8 10*3/uL — AB (ref 1.7–7.7)
Neutrophils Relative %: 46 %
Platelets: 26 10*3/uL — CL (ref 150–400)
RBC: 2.85 MIL/uL — AB (ref 3.87–5.11)
RDW: 16.2 % — ABNORMAL HIGH (ref 11.5–15.5)
WBC: 1.9 10*3/uL — AB (ref 4.0–10.5)

## 2018-01-22 MED ORDER — POTASSIUM CHLORIDE 20 MEQ/15ML (10%) PO SOLN
40.0000 meq | Freq: Once | ORAL | Status: AC
  Administered 2018-01-22: 40 meq via ORAL
  Filled 2018-01-22: qty 30

## 2018-01-22 MED ORDER — QUETIAPINE FUMARATE 25 MG PO TABS
50.0000 mg | ORAL_TABLET | Freq: Every day | ORAL | Status: DC
  Administered 2018-01-22: 50 mg via ORAL
  Filled 2018-01-22: qty 2

## 2018-01-22 MED ORDER — PANTOPRAZOLE SODIUM 40 MG PO TBEC
40.0000 mg | DELAYED_RELEASE_TABLET | Freq: Two times a day (BID) | ORAL | Status: DC
  Administered 2018-01-22 – 2018-01-23 (×2): 40 mg via ORAL
  Filled 2018-01-22 (×2): qty 1

## 2018-01-22 NOTE — Progress Notes (Addendum)
Progress Note   Sara [name] DGL:[id number] DOB: Nov 20, 1976 DOA: 01/19/2018 PCP: Patient, No Pcp Per   LOS: 3 days    Brief Narrative:  Sara [name] is a 42 year old female with a medical history significant for alcohol abuse, alcoholic cirrhosis, portal hypertension and hx of esophageal varices with acute bleed in 2015 who presented to the ED on 01/19/2018 with an acute episode of hematemesis. Upon presentation to the ED, patient was tachycardic at 127 bpm, BP 112/76, temperature 99.2, RR 18, O2 98%. Initial labs demonstrated sodium 142, potassium 4.0, chloride 107, bicarb 22, glucose 121, BUN 15, creatinine 0.45, calcium 7.7, Mg 1.5, AST 153, ALT 52, total bilirubin 1.9, WBC 4.2, Hgb 9.9, HCT 39.4, platelets 27. In the ED, patient received octreotide, Protonix, rocephin, and 1 unit of platelets. GI was consulted.  Patient was admitted on the working diagnosis of acute upper GI bleed.  Assessment/Plan:   Active Problems:   Varices, esophageal ([facility])   Hematemesis   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[place])  1. 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 Upper endoscopy demonstrated three columns of non-bleeding large (>5 mm) varices in the middle third of the esophagus and lower third. Stigmata of recent bleeding were evident and red wale signs were present.  -Patient is currently hemodynamically stable with no further signs of bleeding. Varices have been banded. -Advance to soft diet today -Continue Ceftriaxone for 4 more days, continue Octreotide for total of 72 hours, and start nadolol 20mg  daily after completion of octreotide -Continue protonix and phenergan prn 6 hours for nausea/vomitting  2. Alcoholic Liver Cirrhosis: Patient shows  no signs of encephalopathy. CT of abdomen as noted above. Continue management as noted above.  3. Hypomagnesemia and hypokalemia: Has resolved with current levels of Mg 1.9 and Potassium 3.6. Continue to monitor with BMP tomorrow am.   4. Alcohol abuse: Patient showing no signs of encephalopathy. Continue thiamine po daily and lorazepam prn for symptoms of withdrawal. Patient has had 1 dose of 2mg  lorazepam within the past 24 hours.   Family Communication/Anticipated D/C date and plan/Code Status   DVT prophylaxis: SCDs Code Status: Full Family Communication: No family at bedside Disposition Plan: Home   Medical Consultants:  GI    Antimicrobials:  Rocephin 2g IV at 200 mL q 24 hours (2)   Subjective:  Patient is awake and alert. She has not had any episodes of vomiting within the past 24 hours. Patient denies SOB, chest pain, and signs of active bleeding.   Objective:   Vitals:   01/21/18 1800 01/21/18 2344 01/22/18 0642 01/22/18 1324  BP: 113/68 126/77 101/75 116/71  Pulse: 80 60 77 64  Resp: 20 20 20 18   Temp: 98.7 F (37.1 C) 99 F (37.2 C) 98.3 F (36.8 C) 98.7 F (37.1 C)  TempSrc: Oral Oral Oral Oral  SpO2: 98% 99% 96% 96%  Weight:      Height:        Intake/Output Summary (Last 24 hours) at 01/22/2018 1353 Last data filed at 01/22/2018 1216 Gross per 24 hour  Intake 1015 ml  Output -  Net 1015 ml   Filed Weights   01/19/18 1804  Weight: 70.3 kg (154 lb 15.7 oz)     Physical Exam:   Constitutional: NAD, somnolence  Eyes: lids and conjunctivae normal ENMT: Mucous membranes are moist. Neck: normal, supple, no masses,  Respiratory: clear to auscultation bilaterally, no wheezing, no crackles. Normal respiratory effort. No accessory muscle use.  Cardiovascular: Regular rate and rhythm, normal S1-S2, no murmurs / rubs / gallops. No LE edema. No JVD Abdomen: soft, nontender and nondistended. No rebound or guarding. No noticeable ascites, no caput  medusa.  Musculoskeletal: no clubbing / cyanosis. No joint deformity upper and lower extremities. Normal muscle tone.  Skin: Chest telangiectasis Neurologic: non-focal, negative for asterixis. Psychiatric: Alert and oriented x 3    Data Reviewed:   I have independently reviewed following labs and imaging studies:   CBC: Recent Labs  Lab 01/24/18 2107 01/19/18 1417  01/20/18 0601 01/20/18 1615 01/20/18 2022 01/21/18 0204 01/22/18 0530  WBC 3.0* 4.2  --  2.4*  --   --  2.0* 1.9*  NEUTROABS 1.1  --   --   --   --   --  1.0* 0.8*  HGB 11.5* 9.9*   < > 7.7* 9.0* 9.0* 9.0* 9.4*  HCT 34.5* 29.4*   < > 22.8* 26.1* 26.2* 25.9* 27.4*  MCV 100.6* 99.7  --  99.6  --   --  95.6 96.1  PLT 32* 27*  --  30*  --   --  27* 26*   < > = values in this interval not displayed.   Basic Metabolic Panel: Recent Labs  Lab 24-Jan-2018 1401 01/19/18 1417 01/20/18 0601 01/21/18 0204 01/22/18 0530  NA 147* 142 142 138 137  K 3.7 4.0 4.2 3.7 3.6  CL 110 107 109 106 106  CO2 25 22 23 26 24   GLUCOSE 102* 121* 101* 108* 87  BUN 6 15 12 9 6   CREATININE 0.39* 0.45 0.41* 0.48 0.46  CALCIUM 8.8* 7.7* 7.0* 7.7* 7.8*  MG  --  1.5*  --  1.9  --    GFR: Estimated Creatinine Clearance: 89.9 mL/min (by C-G formula based on SCr of 0.46 mg/dL). Liver Function Tests: Recent Labs  Lab 01-24-18 1401 01/19/18 1417 01/20/18 0601 01/22/18 0530  AST 196* 153* 118* 83*  ALT 60* 52 43 36  ALKPHOS 123 106 81 86  BILITOT 1.8* 1.9* 2.0* 1.8*  PROT 7.8 6.4* 5.3* 5.7*  ALBUMIN 3.8 3.2* 2.8* 2.8*   Recent Labs  Lab 01/19/18 1417  LIPASE 42   No results for input(s): AMMONIA in the last 168 hours. Coagulation Profile: Recent Labs  Lab January 24, 2018 1556 01/20/18 0601  INR 1.20 1.49   Cardiac Enzymes: No results for input(s): CKTOTAL, CKMB, CKMBINDEX, TROPONINI in the last 168 hours. BNP (last 3 results) No results for input(s): PROBNP in the last 8760 hours. HbA1C: No results for input(s): HGBA1C in the  last 72 hours. CBG: No results for input(s): GLUCAP in the last 168 hours. Lipid Profile: No results for input(s): CHOL, HDL, LDLCALC, TRIG, CHOLHDL, LDLDIRECT in the last 72 hours. Thyroid Function Tests: No results for input(s): TSH, T4TOTAL, FREET4, T3FREE, THYROIDAB in the last 72 hours. Anemia Panel: No results for input(s): VITAMINB12, FOLATE, FERRITIN, TIBC, IRON, RETICCTPCT in the last 72 hours. Urine analysis:    Component Value Date/Time   COLORURINE AMBER (A) 11/30/2017 0532   APPEARANCEUR CLEAR 11/30/2017 0532   LABSPEC 1.026 11/30/2017 0532   PHURINE 5.0 11/30/2017 0532   GLUCOSEU NEGATIVE 11/30/2017 0532   HGBUR NEGATIVE 11/30/2017 0532   BILIRUBINUR NEGATIVE 11/30/2017 0532   BILIRUBINUR Neg 05/08/2015 0919   KETONESUR 20 (  A) 11/30/2017 0532   PROTEINUR NEGATIVE 11/30/2017 0532   UROBILINOGEN 0.2 07/28/2015 1711   NITRITE NEGATIVE 11/30/2017 0532   LEUKOCYTESUR NEGATIVE 11/30/2017 0532   Sepsis Labs: Invalid input(s): PROCALCITONIN, LACTICIDVEN  Recent Results (from the past 240 hour(s))  MRSA PCR Screening     Status: None   Collection Time: 01/19/18  6:12 PM  Result Value Ref Range Status   MRSA by PCR NEGATIVE NEGATIVE Final    Comment:        The GeneXpert MRSA Assay (FDA approved for NASAL specimens only), is one component of a comprehensive MRSA colonization surveillance program. It is not intended to diagnose MRSA infection nor to guide or monitor treatment for MRSA infections. Performed at [facility], [place] [address], [place], [place] [address]       Radiology Studies: Ct Abdomen Pelvis W Contrast  Result Date: 01/21/2018 CLINICAL DATA:  History of alcoholic cirrhosis, now with esophageal varices, successfully treated endoscopically. Please perform BRT0 protocol CT scan to evaluate for anatomy for potential TIPS/BRT0 intervention. EXAM: CTA ABDOMEN AND PELVIS WITHOUT AND WITH CONTRAST TECHNIQUE: Multidetector CT imaging of  the abdomen and pelvis was performed using the standard protocol during bolus administration of intravenous contrast. Multiplanar reconstructed images and MIPs were obtained and reviewed to evaluate the vascular anatomy. CONTRAST:  125mL ISOVUE-370 IOPAMIDOL (ISOVUE-370) INJECTION 76% COMPARISON:  CT abdomen pelvis - 07/28/2015 FINDINGS: VASCULAR Aorta: The significant atherosclerotic plaque within a normal caliber abdominal aorta. No abdominal aortic dissection or periaortic stranding. Celiac: Widely patent.  Conventional branching pattern. SMA: Widely patent.  Conventional branching pattern. Renals: Duplicated renal arteries bilaterally. All renal arteries appear widely patent without hemodynamically significant narrowing. No vessel irregularity to suggest FMD. IMA: Widely patent. Inflow: The bilateral common, external and internal iliac arteries are of normal caliber and widely patent without hemodynamically significant stenosis. Proximal Outflow: The bilateral common and imaged portions of the bilateral deep and superficial femoral arteries are of normal caliber and widely patent without hemodynamically significant narrowing. Veins: The pelvic venous system and IVC appear widely patent. The portal vein is widely patent. The main trunk of the right portal vein is noted to be diminutive with early bifurcation into the anterior and posterior divisions. Conventional configuration of the left portal vein. Note is made of a common origin the left and middle hepatic veins (images 12, 13 and 15, series 9). Note is made of recanalization of the periumbilical veins. Note is made of a two adjacent splenorenal shunts (coronal images 59 and 62, series 6) with development of multiple markedly hypertrophied varicosities within the left upper abdominal quadrant. These varicosities do not definitively result in hypertrophied intraluminal gastric varices. Note is made of several tiny distal esophageal varices. Review of the MIP  images confirms the above findings. NON-VASCULAR Lower chest: Limited visualization of the lower thorax demonstrates minimal dependent subpleural ground-glass atelectasis. No pleural effusion. Normal heart size. No pericardial effusion. Hepatobiliary: Marked nodularity of the hepatic contour compatible with provided history of cirrhosis. No discrete hyperenhancing hepatic lesions. There is a small amount perihepatic and perisplenic ascites. Post cholecystectomy. The common bile duct is dilated measuring 1 cm in diameter (coronal image 52, series 6), presumably the sequela post cholecystectomy state. No intrahepatic biliary ductal dilatation. Pancreas: Normal appearance of the pancreas Spleen: Spleen is enlarged measuring approximately 15 cm in length (coronal image 64, series 6). Adrenals/Urinary Tract: There is symmetric enhancement and excretion of the bilateral kidneys. No renal stones. No urine obstruction or perinephric  stranding. Normal appearance of the urinary bladder given degree distention. Normal appearance the bilateral adrenal glands. Stomach/Bowel: As above, as above, note is made tiny paraesophageal varices. No definitive gastric varices. Bowel is normal in course and caliber without wall thickening or evidence of enteric obstruction. No pneumoperitoneum, pneumatosis or portal venous gas. Lymphatic: No bulky retroperitoneal, mesenteric, porta hepatis, pelvic or inguinal lymphadenopathy. Reproductive: Normal appearance of the pelvic organs. No discrete adnexal lesion. No free fluid in the pelvic cul-de-sac. Other: Mild diffuse body wall anasarca, most conspicuous about the midline of the low back. Musculoskeletal: No acute or aggressive osseous abnormalities. IMPRESSION: 1. 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 Tiny distal  esophageal varices without definitive hypertrophied intraluminal gastric varices. 2. No discrete hyperenhancing hepatic lesions to suggest hepatocellular carcinoma. Electronically Signed   By: Sandi Mariscal M.D.   On: 01/21/2018 08:59      Medication:   . chlorhexidine  15 mL Mouth Rinse BID  . magnesium oxide  400 mg Oral TID  . mouth rinse  15 mL Mouth Rinse q12n4p  . multivitamins with iron  1 tablet Oral Daily  . pantoprazole (PROTONIX) IV  40 mg Intravenous Q12H  . thiamine  100 mg Oral Daily    Continuous Infusions: . cefTRIAXone (ROCEPHIN)  IV Stopped (01/21/18 1910)  . octreotide  (SANDOSTATIN)    IV infusion 50 mcg/hr (01/22/18 1216)      Time spent: 20 minutes  Signed, Lanna Poche, PA-S [employer] 2020 Email: [email or web address]

## 2018-01-22 NOTE — Plan of Care (Signed)
  Progressing Health Behavior/Discharge Planning: Ability to manage health-related needs will improve 01/22/2018 2142 - Progressing by Talbert Forest, RN Clinical Measurements: Ability to maintain clinical measurements within normal limits will improve 01/22/2018 2142 - Progressing by Talbert Forest, RN Will remain free from infection 01/22/2018 2142 - Progressing by Talbert Forest, RN Diagnostic test results will improve 01/22/2018 2142 - Progressing by Talbert Forest, RN Respiratory complications will improve 01/22/2018 2142 - Progressing by Talbert Forest, RN Cardiovascular complication will be avoided 01/22/2018 2142 - Progressing by Talbert Forest, RN Nutrition: Adequate nutrition will be maintained 01/22/2018 2142 - Progressing by Talbert Forest, RN Coping: Level of anxiety will decrease 01/22/2018 2142 - Progressing by Talbert Forest, RN Elimination: Will not experience complications related to bowel motility 01/22/2018 2142 - Progressing by Talbert Forest, RN Will not experience complications related to urinary retention 01/22/2018 2142 - Progressing by Talbert Forest, RN Pain Managment: General experience of comfort will improve 01/22/2018 2142 - Progressing by Talbert Forest, RN Safety: Ability to remain free from injury will improve 01/22/2018 2142 - Progressing by Talbert Forest, RN Skin Integrity: Risk for impaired skin integrity will decrease 01/22/2018 2142 - Progressing by Talbert Forest, RN

## 2018-01-22 NOTE — Progress Notes (Signed)
PROGRESS NOTE    Sara [name]  AUQ:[id number] DOB: [birth year] DOA: 01/19/2018 PCP: Patient, No Pcp Per    Brief Narrative:  41 year old female who presented with hematemesis. She does have a significant past medical history of alcohol abuse, alcoholic cirrhosis, portal hypertension and history of esophageal varices with variceal bleed in2015. Patient had an acute episode of hematemesis, approximately a bowl full.On the initial physical examination she was tachycardic 127 bpm, bloodpressure 112/76, temperature 99.2, respiratory 18, oximetry98%.Patient was awake and alert, heart S1-S2 present rhythmic tachycardic, lungs clear to auscultation bilaterally, her abdomen was tender in epigastric region, no guarding, no rebound no peritoneal signs, no lower extremity edema. Sodium 142, potassium 4.0, chloride 107, bicarb 20, glucose 121, BUN 15, creatinine 0.45, calcium 7.7, magnesium 1.5, AST 153, ALT 52, total bilirubin 1.9, white count 4.2, hemoglobin 9.9, hematocrit 39.4, platelets 27.CT head, neck and maxillofacial negative for acute changes.  Patient was admitted to the hospital with the working diagnosis of acute upper GI bleed.   Assessment & Plan:   Active Problems:   Varices, esophageal (HCC)   Hematemesis   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[place])   1. Acute blood loss anemia due to upper GI bleed, esophageal varices. SP 2 units PRBC transfusion. Sp varices banding. Continue to advance diet as tolerated. Will continue pantoprazole po bid. On prophylactic IV ceftriaxone for 5 days and octreotride infusion for 3 days. May need non selective b blockade at discharge.   2. Alcoholic liver cirrhosis. Patient with no encephalopathy, no clinical signs of significant ascites. Will continue close monitoring. Patient will eventually need TIPS. CT of the abdomen with portal venous hypertension, splenomegaly, enlarged spleno-renal shunt and trace of ascites. Last dose of lorazepam at  23:40 last night (03/03)/ 2 mg.   3. Hypomagnesemia and hypokalemia. Patient tolerating po well, will continue K correction with kcl, will follow on renal panel in am. Renal function with serum cr at 0.46. Continue po Mg check level in am.   4.Etho abuse. Continue with  CIWA protocol. Currently with no signs of withdrawal, continue thiamine.   DVT prophylaxis:scd Code Status:full Family Communication:no family at the bedside Disposition Plan:home   Consultants:  GI  Procedures:  Upper endoscopy  Antimicrobials   Subjective: Patient is feeling better, mild chest pain and odynophagia, no nausea or vomiting, no confusion or agitation.   Objective: Vitals:   01/21/18 1800 01/21/18 2344 01/22/18 0642 01/22/18 1324  BP: 113/68 126/77 101/75 116/71  Pulse: 80 60 77 64  Resp: 20 20 20 18   Temp: 98.7 F (37.1 C) 99 F (37.2 C) 98.3 F (36.8 C) 98.7 F (37.1 C)  TempSrc: Oral Oral Oral Oral  SpO2: 98% 99% 96% 96%  Weight:      Height:        Intake/Output Summary (Last 24 hours) at 01/22/2018 1430 Last data filed at 01/22/2018 1216 Gross per 24 hour  Intake 1015 ml  Output -  Net 1015 ml   Filed Weights   01/19/18 1804  Weight: 70.3 kg (154 lb 15.7 oz)    Examination:   General: Not in pain or dyspnea, deconditioned Neurology: Awake and alert, non focal  E ENT: mild pallor, no icterus, oral mucosa moist Cardiovascular: No JVD. S1-S2 present, rhythmic, no gallops, rubs, or murmurs. No lower extremity edema. Pulmonary: vesicular breath sounds bilaterally, adequate air movement, no wheezing, rhonchi or rales. Gastrointestinal. Abdomen with mild distention, no organomegaly, non tender, no rebound or guarding Skin. Anterior chest telangiectasias.  Musculoskeletal: no joint deformities     Data Reviewed: I have personally reviewed following labs and imaging studies  CBC: Recent Labs  Lab 01/16/18 2107 01/19/18 1417  01/20/18 0601  01/20/18 1615 01/20/18 2022 01/21/18 0204 01/22/18 0530  WBC 3.0* 4.2  --  2.4*  --   --  2.0* 1.9*  NEUTROABS 1.1  --   --   --   --   --  1.0* 0.8*  HGB 11.5* 9.9*   < > 7.7* 9.0* 9.0* 9.0* 9.4*  HCT 34.5* 29.4*   < > 22.8* 26.1* 26.2* 25.9* 27.4*  MCV 100.6* 99.7  --  99.6  --   --  95.6 96.1  PLT 32* 27*  --  30*  --   --  27* 26*   < > = values in this interval not displayed.   Basic Metabolic Panel: Recent Labs  Lab 01/16/18 1401 01/19/18 1417 01/20/18 0601 01/21/18 0204 01/22/18 0530  NA 147* 142 142 138 137  K 3.7 4.0 4.2 3.7 3.6  CL 110 107 109 106 106  CO2 25 22 23 26 24   GLUCOSE 102* 121* 101* 108* 87  BUN 6 15 12 9 6   CREATININE 0.39* 0.45 0.41* 0.48 0.46  CALCIUM 8.8* 7.7* 7.0* 7.7* 7.8*  MG  --  1.5*  --  1.9  --    GFR: Estimated Creatinine Clearance: 89.9 mL/min (by C-G formula based on SCr of 0.46 mg/dL). Liver Function Tests: Recent Labs  Lab 01/16/18 1401 01/19/18 1417 01/20/18 0601 01/22/18 0530  AST 196* 153* 118* 83*  ALT 60* 52 43 36  ALKPHOS 123 106 81 86  BILITOT 1.8* 1.9* 2.0* 1.8*  PROT 7.8 6.4* 5.3* 5.7*  ALBUMIN 3.8 3.2* 2.8* 2.8*   Recent Labs  Lab 01/19/18 1417  LIPASE 42   No results for input(s): AMMONIA in the last 168 hours. Coagulation Profile: Recent Labs  Lab 01/16/18 1556 01/20/18 0601  INR 1.20 1.49   Cardiac Enzymes: No results for input(s): CKTOTAL, CKMB, CKMBINDEX, TROPONINI in the last 168 hours. BNP (last 3 results) No results for input(s): PROBNP in the last 8760 hours. HbA1C: No results for input(s): HGBA1C in the last 72 hours. CBG: No results for input(s): GLUCAP in the last 168 hours. Lipid Profile: No results for input(s): CHOL, HDL, LDLCALC, TRIG, CHOLHDL, LDLDIRECT in the last 72 hours. Thyroid Function Tests: No results for input(s): TSH, T4TOTAL, FREET4, T3FREE, THYROIDAB in the last 72 hours. Anemia Panel: No results for input(s): VITAMINB12, FOLATE, FERRITIN, TIBC, IRON, RETICCTPCT in the  last 72 hours.    Radiology Studies: I have reviewed all of the imaging during this hospital visit personally     Scheduled Meds: . chlorhexidine  15 mL Mouth Rinse BID  . magnesium oxide  400 mg Oral TID  . mouth rinse  15 mL Mouth Rinse q12n4p  . multivitamins with iron  1 tablet Oral Daily  . pantoprazole (PROTONIX) IV  40 mg Intravenous Q12H  . thiamine  100 mg Oral Daily   Continuous Infusions: . cefTRIAXone (ROCEPHIN)  IV Stopped (01/21/18 1910)  . octreotide  (SANDOSTATIN)    IV infusion 50 mcg/hr (01/22/18 1216)     LOS: 3 days        Sara Holzmann Gerome Apley, MD [facility] Pager [phone]

## 2018-01-22 NOTE — Progress Notes (Signed)
Referring Physician(s): Dr. Silverio Decamp  Supervising Physician: Sandi Mariscal  Patient Status:  [facility] - In-pt  Chief Complaint: Hematemesis  Subjective: "I'm just bored." Responds slowly, but appropriately.   Allergies: Morphine and related and Nsaids  Medications: Prior to Admission medications   Medication Sig Start Date End Date Taking? Authorizing Provider  Multiple Vitamin (MULTIVITAMIN WITH MINERALS) TABS tablet Take 1 tablet by mouth daily.   Yes [provider]  QUEtiapine (SEROQUEL) 50 MG tablet Take 50 mg by mouth at bedtime. 11/10/17  Yes [provider]  thiamine (VITAMIN B-1) 100 MG tablet Take 1 tablet (100 mg total) by mouth daily. 09/11/17  Yes Duffy Bruce, MD  chlordiazePOXIDE (LIBRIUM) 25 MG capsule 50mg  PO TID x 1D, then 25-50mg  PO BID X 1D, then 25-50mg  PO QD X 1D Patient not taking: Reported on 01/19/2018 01/17/18   Montine Circle, PA-C     Vital Signs: BP 116/71 (BP Location: Left Arm)   Pulse 64   Temp 98.7 F (37.1 C) (Oral)   Resp 18   Ht 5\' 4"  (1.626 m)   Wt 154 lb 15.7 oz (70.3 kg)   SpO2 96%   BMI 26.60 kg/m   Physical Exam  Constitutional: She is oriented to person, place, and time. She appears well-developed.  Cardiovascular: Normal rate and regular rhythm.  Pulmonary/Chest: Effort normal and breath sounds normal. No respiratory distress.  Abdominal: Soft. Bowel sounds are normal.  Neurological: She is alert and oriented to person, place, and time.  Skin: Skin is warm and dry.  Nursing note and vitals reviewed.   Imaging: Ct Abdomen Pelvis W Contrast  Result Date: 01/21/2018 CLINICAL DATA:  History of alcoholic cirrhosis, now with esophageal varices, successfully treated endoscopically. Please perform BRT0 protocol CT scan to evaluate for anatomy for potential TIPS/BRT0 intervention. EXAM: CTA ABDOMEN AND PELVIS WITHOUT AND WITH CONTRAST TECHNIQUE: Multidetector CT imaging of the abdomen and pelvis was performed  using the standard protocol during bolus administration of intravenous contrast. Multiplanar reconstructed images and MIPs were obtained and reviewed to evaluate the vascular anatomy. CONTRAST:  120mL ISOVUE-370 IOPAMIDOL (ISOVUE-370) INJECTION 76% COMPARISON:  CT abdomen pelvis - 07/28/2015 FINDINGS: VASCULAR Aorta: The significant atherosclerotic plaque within a normal caliber abdominal aorta. No abdominal aortic dissection or periaortic stranding. Celiac: Widely patent.  Conventional branching pattern. SMA: Widely patent.  Conventional branching pattern. Renals: Duplicated renal arteries bilaterally. All renal arteries appear widely patent without hemodynamically significant narrowing. No vessel irregularity to suggest FMD. IMA: Widely patent. Inflow: The bilateral common, external and internal iliac arteries are of normal caliber and widely patent without hemodynamically significant stenosis. Proximal Outflow: The bilateral common and imaged portions of the bilateral deep and superficial femoral arteries are of normal caliber and widely patent without hemodynamically significant narrowing. Veins: The pelvic venous system and IVC appear widely patent. The portal vein is widely patent. The main trunk of the right portal vein is noted to be diminutive with early bifurcation into the anterior and posterior divisions. Conventional configuration of the left portal vein. Note is made of a common origin the left and middle hepatic veins (images 12, 13 and 15, series 9). Note is made of recanalization of the periumbilical veins. Note is made of a two adjacent splenorenal shunts (coronal images 59 and 62, series 6) with development of multiple markedly hypertrophied varicosities within the left upper abdominal quadrant. These varicosities do not definitively result in hypertrophied intraluminal gastric varices. Note is made of several tiny distal esophageal varices.  Review of the MIP images confirms the above findings.  NON-VASCULAR Lower chest: Limited visualization of the lower thorax demonstrates minimal dependent subpleural ground-glass atelectasis. No pleural effusion. Normal heart size. No pericardial effusion. Hepatobiliary: Marked nodularity of the hepatic contour compatible with provided history of cirrhosis. No discrete hyperenhancing hepatic lesions. There is a small amount perihepatic and perisplenic ascites. Post cholecystectomy. The common bile duct is dilated measuring 1 cm in diameter (coronal image 52, series 6), presumably the sequela post cholecystectomy state. No intrahepatic biliary ductal dilatation. Pancreas: Normal appearance of the pancreas Spleen: Spleen is enlarged measuring approximately 15 cm in length (coronal image 64, series 6). Adrenals/Urinary Tract: There is symmetric enhancement and excretion of the bilateral kidneys. No renal stones. No urine obstruction or perinephric stranding. Normal appearance of the urinary bladder given degree distention. Normal appearance the bilateral adrenal glands. Stomach/Bowel: As above, as above, note is made tiny paraesophageal varices. No definitive gastric varices. Bowel is normal in course and caliber without wall thickening or evidence of enteric obstruction. No pneumoperitoneum, pneumatosis or portal venous gas. Lymphatic: No bulky retroperitoneal, mesenteric, porta hepatis, pelvic or inguinal lymphadenopathy. Reproductive: Normal appearance of the pelvic organs. No discrete adnexal lesion. No free fluid in the pelvic cul-de-sac. Other: Mild diffuse body wall anasarca, most conspicuous about the midline of the low back. Musculoskeletal: No acute or aggressive osseous abnormalities. IMPRESSION: 1. 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 Tiny distal esophageal varices without definitive  hypertrophied intraluminal gastric varices. 2. No discrete hyperenhancing hepatic lesions to suggest hepatocellular carcinoma. Electronically Signed   By: Sandi Mariscal M.D.   On: 01/21/2018 08:59    Labs:  CBC: Recent Labs    01/19/18 1417  01/20/18 0601 01/20/18 1615 01/20/18 2022 01/21/18 0204 01/22/18 0530  WBC 4.2  --  2.4*  --   --  2.0* 1.9*  HGB 9.9*   < > 7.7* 9.0* 9.0* 9.0* 9.4*  HCT 29.4*   < > 22.8* 26.1* 26.2* 25.9* 27.4*  PLT 27*  --  30*  --   --  27* 26*   < > = values in this interval not displayed.    COAGS: Recent Labs    02/10/17 1555 05/04/17 0634 01/16/18 1556 01/20/18 0601  INR 1.23 1.28 1.20 1.49  APTT  --   --  36  --     BMP: Recent Labs    01/19/18 1417 01/20/18 0601 01/21/18 0204 01/22/18 0530  NA 142 142 138 137  K 4.0 4.2 3.7 3.6  CL 107 109 106 106  CO2 22 23 26 24   GLUCOSE 121* 101* 108* 87  BUN 15 12 9 6   CALCIUM 7.7* 7.0* 7.7* 7.8*  CREATININE 0.45 0.41* 0.48 0.46  GFRNONAA >60 >60 >60 >60  GFRAA >60 >60 >60 >60    LIVER FUNCTION TESTS: Recent Labs    01/16/18 1401 01/19/18 1417 01/20/18 0601 01/22/18 0530  BILITOT 1.8* 1.9* 2.0* 1.8*  AST 196* 153* 118* 83*  ALT 60* 52 43 36  ALKPHOS 123 106 81 86  PROT 7.8 6.4* 5.3* 5.7*  ALBUMIN 3.8 3.2* 2.8* 2.8*    Assessment and Plan: GI Bleed Patient currently stable. HgB 9.4 Underwent CT Abdomen/Pelvis with BRTO protocol yesterday. Will review with Dr. Pascal Lux.  Discussed non-emergent with patient at bedside which would require ongoing outpatient assessment and work- up to determine candidacy and  evaluate risk vs. Benefit; especially given ongoing alcohol use.   Electronically Signed: Docia Barrier, PA 01/22/2018, 4:49 PM   I spent a total of 15 Minutes at the the patient's bedside AND on the patient's hospital floor or unit, greater than 50% of which was counseling/coordinating care for GI bleed.

## 2018-01-22 NOTE — Progress Notes (Addendum)
Progress Note   Subjective  Chief Complaint: Alcoholic cirrhosis, decompensated with esophageal variceal hemorrhage status post EGD with esophageal varices banding  Today found sitting up in bed and asked me when she revealed to go home as she is "bored".  Denies any new complaints including abdominal pain or trouble with bowel movements.   Objective   Vital signs in last 24 hours: Temp:  [98.3 F (36.8 C)-99 F (37.2 C)] 98.3 F (36.8 C) (03/04 7616) Pulse Rate:  [60-80] 77 (03/04 0642) Resp:  [16-20] 20 (03/04 0642) BP: (101-126)/(68-77) 101/75 (03/04 0642) SpO2:  [96 %-99 %] 96 % (03/04 0642) Last BM Date: 01/20/18 General:  female in NAD Heart:  Regular rate and rhythm; no murmurs Lungs: Respirations even and unlabored, lungs CTA bilaterally Abdomen:  Soft, nontender and nondistended. Normal bowel sounds. Extremities:  edema Neurologic:  Alert and oriented,  grossly normal neurologically. Psych:  Cooperative. Normal mood and affect.  Intake/Output from previous day: 03/03 0701 - 03/04 0700 In: 715 [P.O.:240; I.V.:275; IV Piggyback:200] Out: -  Intake/Output this shift: No intake/output data recorded.  Lab Results: Recent Labs    01/20/18 0601  01/20/18 2022 01/21/18 0204 01/22/18 0530  WBC 2.4*  --   --  2.0* 1.9*  HGB 7.7*   < > 9.0* 9.0* 9.4*  HCT 22.8*   < > 26.2* 25.9* 27.4*  PLT 30*  --   --  27* 26*   < > = values in this interval not displayed.   BMET Recent Labs    01/20/18 0601 01/21/18 0204 01/22/18 0530  NA 142 138 137  K 4.2 3.7 3.6  CL 109 106 106  CO2 23 26 24   GLUCOSE 101* 108* 87  BUN 12 9 6   CREATININE 0.41* 0.48 0.46  CALCIUM 7.0* 7.7* 7.8*   LFT Recent Labs    01/22/18 0530  PROT 5.7*  ALBUMIN 2.8*  AST 83*  ALT 36  ALKPHOS 86  BILITOT 1.8*   PT/INR Recent Labs    01/20/18 0601  LABPROT 17.9*  INR 1.49    Studies/Results: Ct Abdomen Pelvis W Contrast  Result Date: 01/21/2018 CLINICAL DATA:  History of  alcoholic cirrhosis, now with esophageal varices, successfully treated endoscopically. Please perform BRT0 protocol CT scan to evaluate for anatomy for potential TIPS/BRT0 intervention. EXAM: CTA ABDOMEN AND PELVIS WITHOUT AND WITH CONTRAST TECHNIQUE: Multidetector CT imaging of the abdomen and pelvis was performed using the standard protocol during bolus administration of intravenous contrast. Multiplanar reconstructed images and MIPs were obtained and reviewed to evaluate the vascular anatomy. CONTRAST:  134mL ISOVUE-370 IOPAMIDOL (ISOVUE-370) INJECTION 76% COMPARISON:  CT abdomen pelvis - 07/28/2015 FINDINGS: VASCULAR Aorta: The significant atherosclerotic plaque within a normal caliber abdominal aorta. No abdominal aortic dissection or periaortic stranding. Celiac: Widely patent.  Conventional branching pattern. SMA: Widely patent.  Conventional branching pattern. Renals: Duplicated renal arteries bilaterally. All renal arteries appear widely patent without hemodynamically significant narrowing. No vessel irregularity to suggest FMD. IMA: Widely patent. Inflow: The bilateral common, external and internal iliac arteries are of normal caliber and widely patent without hemodynamically significant stenosis. Proximal Outflow: The bilateral common and imaged portions of the bilateral deep and superficial femoral arteries are of normal caliber and widely patent without hemodynamically significant narrowing. Veins: The pelvic venous system and IVC appear widely patent. The portal vein is widely patent. The main trunk of the right portal vein is noted to be diminutive with early bifurcation into the anterior and posterior  divisions. Conventional configuration of the left portal vein. Note is made of a common origin the left and middle hepatic veins (images 12, 13 and 15, series 9). Note is made of recanalization of the periumbilical veins. Note is made of a two adjacent splenorenal shunts (coronal images 59 and 62,  series 6) with development of multiple markedly hypertrophied varicosities within the left upper abdominal quadrant. These varicosities do not definitively result in hypertrophied intraluminal gastric varices. Note is made of several tiny distal esophageal varices. Review of the MIP images confirms the above findings. NON-VASCULAR Lower chest: Limited visualization of the lower thorax demonstrates minimal dependent subpleural ground-glass atelectasis. No pleural effusion. Normal heart size. No pericardial effusion. Hepatobiliary: Marked nodularity of the hepatic contour compatible with provided history of cirrhosis. No discrete hyperenhancing hepatic lesions. There is a small amount perihepatic and perisplenic ascites. Post cholecystectomy. The common bile duct is dilated measuring 1 cm in diameter (coronal image 52, series 6), presumably the sequela post cholecystectomy state. No intrahepatic biliary ductal dilatation. Pancreas: Normal appearance of the pancreas Spleen: Spleen is enlarged measuring approximately 15 cm in length (coronal image 64, series 6). Adrenals/Urinary Tract: There is symmetric enhancement and excretion of the bilateral kidneys. No renal stones. No urine obstruction or perinephric stranding. Normal appearance of the urinary bladder given degree distention. Normal appearance the bilateral adrenal glands. Stomach/Bowel: As above, as above, note is made tiny paraesophageal varices. No definitive gastric varices. Bowel is normal in course and caliber without wall thickening or evidence of enteric obstruction. No pneumoperitoneum, pneumatosis or portal venous gas. Lymphatic: No bulky retroperitoneal, mesenteric, porta hepatis, pelvic or inguinal lymphadenopathy. Reproductive: Normal appearance of the pelvic organs. No discrete adnexal lesion. No free fluid in the pelvic cul-de-sac. Other: Mild diffuse body wall anasarca, most conspicuous about the midline of the low back. Musculoskeletal: No acute  or aggressive osseous abnormalities. IMPRESSION: 1. 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 Tiny distal esophageal varices without definitive hypertrophied intraluminal gastric varices. 2. No discrete hyperenhancing hepatic lesions to suggest hepatocellular carcinoma. Electronically Signed   By: Sandi Mariscal M.D.   On: 01/21/2018 08:59       Assessment / Plan:   Assessment: 1.  Decompensated alcoholic cirrhosis: Ongoing alcohol use, variceal hemorrhage status post EGD with esophageal variceal band ligation, meld 14, child Pugh B, discriminant function score less than 32, no indication to start steroids  Plan: 1.  Patient may advance to soft diet today 2.  Continue Ceftriaxone for 4 more days 3.  Continue Octreotide for total of 72 hours and start nadolol 20 mg daily after completion of octreotide 4.  Appreciate IR consult to discuss elective early TIPS to decrease portal hypertension and recurrent variceal hemorrhage 5.  Please await further recommendations Dr. Silverio Decamp later today  Thank you for your kind consultation, we will continue to follow.   LOS: 3 days   Levin Erp  01/22/2018, 12:03 PM  Pager # [phone]   Attending physician's note   I have taken an interval history, reviewed the chart and examined the patient. I agree with the Advanced Practitioner's note, impression and recommendations.  She remains hemodynamically stable with no evidence of further bleeding. Hemoglobin is stable Octreotide for a total 72 hours followed by nadolol 20 mg daily Patient does not want to proceed with TIPS at this point.  Will follow-up as outpatient. Discussed  in detail with patient regarding alcohol cessation Okay to discharge home tomorrow Follow-up in GI office in 2-4 weeks  K Denzil Magnuson, MD [phone]  Mon-Fri 8a-5p [phone] after 5p, weekends, holidays

## 2018-01-23 ENCOUNTER — Telehealth: Payer: Self-pay

## 2018-01-23 LAB — CBC WITH DIFFERENTIAL/PLATELET
BASOS PCT: 1 %
Basophils Absolute: 0 10*3/uL (ref 0.0–0.1)
EOS PCT: 3 %
Eosinophils Absolute: 0.1 10*3/uL (ref 0.0–0.7)
HCT: 28.9 % — ABNORMAL LOW (ref 36.0–46.0)
HEMOGLOBIN: 9.9 g/dL — AB (ref 12.0–15.0)
LYMPHS PCT: 46 %
Lymphs Abs: 0.9 10*3/uL (ref 0.7–4.0)
MCH: 32.5 pg (ref 26.0–34.0)
MCHC: 34.3 g/dL (ref 30.0–36.0)
MCV: 94.8 fL (ref 78.0–100.0)
MONOS PCT: 10 %
Monocytes Absolute: 0.2 10*3/uL (ref 0.1–1.0)
NEUTROS PCT: 40 %
Neutro Abs: 0.8 10*3/uL — ABNORMAL LOW (ref 1.7–7.7)
PLATELETS: 35 10*3/uL — AB (ref 150–400)
RBC: 3.05 MIL/uL — AB (ref 3.87–5.11)
RDW: 15.7 % — ABNORMAL HIGH (ref 11.5–15.5)
WBC: 2 10*3/uL — AB (ref 4.0–10.5)

## 2018-01-23 LAB — BASIC METABOLIC PANEL
ANION GAP: 8 (ref 5–15)
BUN: 7 mg/dL (ref 6–20)
CHLORIDE: 106 mmol/L (ref 101–111)
CO2: 24 mmol/L (ref 22–32)
Calcium: 8 mg/dL — ABNORMAL LOW (ref 8.9–10.3)
Creatinine, Ser: 0.5 mg/dL (ref 0.44–1.00)
GFR calc non Af Amer: >60 mL/min (ref >60)
Glucose, Bld: 88 mg/dL (ref 65–99)
POTASSIUM: 3.9 mmol/L (ref 3.5–5.1)
SODIUM: 138 mmol/L (ref 135–145)

## 2018-01-23 MED ORDER — LEVOFLOXACIN 500 MG PO TABS: 500.0000 mg | ORAL_TABLET | Freq: Every day | ORAL | 0 refills | Status: AC

## 2018-01-23 MED ORDER — NADOLOL 20 MG PO TABS: 20.0000 mg | ORAL_TABLET | Freq: Every day | ORAL | 0 refills | Status: DC

## 2018-01-23 MED ORDER — RANITIDINE HCL 300 MG PO TABS: 300.0000 mg | ORAL_TABLET | Freq: Every day | ORAL | 1 refills | Status: DC

## 2018-01-23 MED ORDER — NADOLOL 20 MG PO TABS
20.0000 mg | ORAL_TABLET | Freq: Every day | ORAL | Status: DC
  Administered 2018-01-23: 20 mg via ORAL
  Filled 2018-01-23: qty 1

## 2018-01-23 MED ORDER — LEVOFLOXACIN 500 MG PO TABS
500.0000 mg | ORAL_TABLET | Freq: Every day | ORAL | Status: DC
  Administered 2018-01-23: 500 mg via ORAL
  Filled 2018-01-23: qty 1

## 2018-01-23 NOTE — Telephone Encounter (Signed)
-----   Message from Levin Erp, [place] sent at 01/23/2018 12:36 PM EST ----- Regarding: Needs follow up appt Needs follow up with Nevin Bloodgood for etoh cirrhosis in 2-3 weeks. Thanks-JLL

## 2018-01-23 NOTE — Telephone Encounter (Signed)
02/14/18 at 9 am appt with Tye Savoy, the phone number listed in Epic is not in service.  Appt sent via My Chart

## 2018-01-23 NOTE — Care Management Note (Signed)
Case Management Note  Patient Details  Name: Barbie Croston MRN: [id number] Date of Birth: 1976-09-17  Subjective/Objective:                    Action/Plan:d/c home.   Expected Discharge Date:  01/23/18               Expected Discharge Plan:  Home/Self Care  In-House Referral:     Discharge planning Services  CM Consult  Post Acute Care Choice:    Choice offered to:     DME Arranged:    DME Agency:     HH Arranged:    HH Agency:     Status of Service:  Completed, signed off  If discussed at [employer] of Stay Meetings, dates discussed:    Additional Comments:  Dessa Phi, RN 01/23/2018, 10:25 AM

## 2018-01-23 NOTE — Discharge Summary (Addendum)
Physician Discharge Summary  Sara [name] FBP:[id number] DOB: 01/15/1976 DOA: 01/19/2018  PCP: Patient, No Pcp Per  Admit date: 01/19/2018 Discharge date: 01/23/2018  Admitted From: Home Disposition:  Home  Recommendations for Outpatient Follow-up and new medication changes:  1. Follow up with PCP in 1- week 2. Patient has been placed on nadolol 20 ng daily 3. Will need referral to liver clinic 4. Placed on daily Ranitidine 5. Complete 3 more days of prophylactic antibiotic therapy with Levofloxacin.   Home Health: no  Equipment/Devices: no   Discharge Condition: Stable CODE STATUS: Full  Diet recommendation: Regular  Brief/Interim Summary: 42 year old female who presented with hematemesis. She does have a significant past medical history of alcohol abuse, alcoholic cirrhosis, portal hypertension and history of esophageal varices with variceal bleed in2015. Patient had an acute episode of hematemesis, approximately a bowl full.On the initial physical examination she was tachycardic 127 bpm, bloodpressure 112/76, temperature 99.2, respiratory rate 18, oximetry98%.Patient was awake and alert, heart S1-S2 present rhythmic tachycardic, lungs clear to auscultation bilaterally, her abdomen was tender in epigastric region, no guarding, no rebound no peritoneal signs, no lower extremity edema. Sodium 142, potassium 4.0, chloride 107, bicarb 20, glucose 121, BUN 15, creatinine 0.45, calcium 7.7, magnesium 1.5, AST 153, ALT 52, total bilirubin 1.9, white count 4.2, hemoglobin 9.9, hematocrit 39.4, platelets 27.CT head, neck and maxillofacial negative for acute changes.  Patient was admitted to the hospital with the working diagnosis of acute upper GI bleed.  1.  Acute blood loss anemia due to upper GI bleed, from esophageal varices due to liver alcoholic cirrhosis.  Patient was admitted to the stepdown unit, she received 2 packed red blood cell transfusion, she remained hemodynamically  stable, she was placed on octreotide infusion, IV proton pump inhibitors and prophylactic antibiotic therapy with IV ceftriaxone.  She underwent upper endoscopy, finding features of recently bleeding of large greater than 5 mm esophageal varices, completely eradicated/banded.  Portal hypertensive gastropathy, the duodenum was normal.  Patient's  diet was advanced progressively with good toleration, no further signs of bleeding, she completed 72 hours of octreotide, she has been transitioned to nadolol/nonselective beta-blockade.  2.  Alcoholic liver cirrhosis.  Further workup with abdominal CT shows stigmata of cirrhosis and portal venous hypertension with splenomegaly, recanalization of the paraumbilical veins, development of marked enlarged splenorenal shunt and trace amount of intra-abdominal ascites.  Interventional radiology was consulted for possible TIPS.  It was noted to have if TIPS is pursued, procedure will likely require additional embolization of the enlargement renal shunt given probability of TIPS dysfunction (and potential TIPS occlusion) given competitive flow through this competing product of systemic shunt.  Patient was instructed to follow-up with liver clinic.  Clinically not encephalopathic, INR 1.49, patient was advised to avoid alcohol.  3.  Alcohol abuse.  Patient was placed on benzodiazepine protocol for alcohol withdrawal, she responded well with no significant signs of withdrawal.  She was advised to avoid further alcohol consumption.  4.  Hypomagnesemia and hypokalemia.  Electrolytes were corrected with potassium chloride and magnesium sulfate, discharge potassium of 3.9, magnesium 1.9.  Patient tolerating diet adequately.  Kidney function remained stable with a serum creatinine 0.50  Discharge Diagnoses:  Active Problems:   Varices, esophageal (HCC)   Hematemesis   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[place])    Discharge Instructions   Allergies as of 01/23/2018       Reactions   Morphine And Related Other (See Comments)   Slowed HR, lowered BP  Nsaids Other (See Comments)   bleeding      Medication List    STOP taking these medications   chlordiazePOXIDE 25 MG capsule Commonly known as:  LIBRIUM     TAKE these medications   levofloxacin 500 MG tablet Commonly known as:  LEVAQUIN Take 1 tablet (500 mg total) by mouth daily for 3 days.   multivitamin with minerals Tabs tablet Take 1 tablet by mouth daily.   nadolol 20 MG tablet Commonly known as:  CORGARD Take 1 tablet (20 mg total) by mouth daily.   QUEtiapine 50 MG tablet Commonly known as:  SEROQUEL Take 50 mg by mouth at bedtime.   ranitidine 300 MG tablet Commonly known as:  ZANTAC Take 1 tablet (300 mg total) by mouth at bedtime.   thiamine 100 MG tablet Commonly known as:  VITAMIN B-1 Take 1 tablet (100 mg total) by mouth daily.       Allergies  Allergen Reactions  . Morphine And Related Other (See Comments)    Slowed HR, lowered BP  . Nsaids Other (See Comments)    bleeding    Consultations:  Gastroenterology  Interventional radiology   Procedures/Studies: Dg Chest 2 View  Result Date: 01/16/2018 CLINICAL DATA:  Chest pain beginning at 6 a.m. this morning with shortness of breath and nausea. EXAM: CHEST  2 VIEW COMPARISON:  PA and lateral chest 10/24/2015 and 11/04/2014. FINDINGS: Lung volumes are low with crowding of the bronchovascular structures. Heart size is upper normal. No pneumothorax or pleural effusion. No bony abnormality. IMPRESSION: No acute disease. Electronically Signed   By: Inge Rise M.D.   On: 01/16/2018 14:36   Ct Abdomen Pelvis W Contrast  Result Date: 01/21/2018 CLINICAL DATA:  History of alcoholic cirrhosis, now with esophageal varices, successfully treated endoscopically. Please perform BRT0 protocol CT scan to evaluate for anatomy for potential TIPS/BRT0 intervention. EXAM: CTA ABDOMEN AND PELVIS WITHOUT AND WITH CONTRAST  TECHNIQUE: Multidetector CT imaging of the abdomen and pelvis was performed using the standard protocol during bolus administration of intravenous contrast. Multiplanar reconstructed images and MIPs were obtained and reviewed to evaluate the vascular anatomy. CONTRAST:  113mL ISOVUE-370 IOPAMIDOL (ISOVUE-370) INJECTION 76% COMPARISON:  CT abdomen pelvis - 07/28/2015 FINDINGS: VASCULAR Aorta: The significant atherosclerotic plaque within a normal caliber abdominal aorta. No abdominal aortic dissection or periaortic stranding. Celiac: Widely patent.  Conventional branching pattern. SMA: Widely patent.  Conventional branching pattern. Renals: Duplicated renal arteries bilaterally. All renal arteries appear widely patent without hemodynamically significant narrowing. No vessel irregularity to suggest FMD. IMA: Widely patent. Inflow: The bilateral common, external and internal iliac arteries are of normal caliber and widely patent without hemodynamically significant stenosis. Proximal Outflow: The bilateral common and imaged portions of the bilateral deep and superficial femoral arteries are of normal caliber and widely patent without hemodynamically significant narrowing. Veins: The pelvic venous system and IVC appear widely patent. The portal vein is widely patent. The main trunk of the right portal vein is noted to be diminutive with early bifurcation into the anterior and posterior divisions. Conventional configuration of the left portal vein. Note is made of a common origin the left and middle hepatic veins (images 12, 13 and 15, series 9). Note is made of recanalization of the periumbilical veins. Note is made of a two adjacent splenorenal shunts (coronal images 59 and 62, series 6) with development of multiple markedly hypertrophied varicosities within the left upper abdominal quadrant. These varicosities do not definitively result in hypertrophied intraluminal gastric  varices. Note is made of several tiny distal  esophageal varices. Review of the MIP images confirms the above findings. NON-VASCULAR Lower chest: Limited visualization of the lower thorax demonstrates minimal dependent subpleural ground-glass atelectasis. No pleural effusion. Normal heart size. No pericardial effusion. Hepatobiliary: Marked nodularity of the hepatic contour compatible with provided history of cirrhosis. No discrete hyperenhancing hepatic lesions. There is a small amount perihepatic and perisplenic ascites. Post cholecystectomy. The common bile duct is dilated measuring 1 cm in diameter (coronal image 52, series 6), presumably the sequela post cholecystectomy state. No intrahepatic biliary ductal dilatation. Pancreas: Normal appearance of the pancreas Spleen: Spleen is enlarged measuring approximately 15 cm in length (coronal image 64, series 6). Adrenals/Urinary Tract: There is symmetric enhancement and excretion of the bilateral kidneys. No renal stones. No urine obstruction or perinephric stranding. Normal appearance of the urinary bladder given degree distention. Normal appearance the bilateral adrenal glands. Stomach/Bowel: As above, as above, note is made tiny paraesophageal varices. No definitive gastric varices. Bowel is normal in course and caliber without wall thickening or evidence of enteric obstruction. No pneumoperitoneum, pneumatosis or portal venous gas. Lymphatic: No bulky retroperitoneal, mesenteric, porta hepatis, pelvic or inguinal lymphadenopathy. Reproductive: Normal appearance of the pelvic organs. No discrete adnexal lesion. No free fluid in the pelvic cul-de-sac. Other: Mild diffuse body wall anasarca, most conspicuous about the midline of the low back. Musculoskeletal: No acute or aggressive osseous abnormalities. IMPRESSION: 1. Stigmata of cirrhosis and portal venous hypertension with splenomegaly, recanalization of the periumbilical veins, development of a markedly enlarged splenorenal shunt and trace amount of  intra-abdominal ascites. Tiny distal esophageal varices without definitive hypertrophied intraluminal gastric varices. 2. No discrete hyperenhancing hepatic lesions to suggest hepatocellular carcinoma. Electronically Signed   By: Sandi Mariscal M.D.   On: 01/21/2018 08:59       Subjective: Patient is feeling better, no nausea, no vomiting, no chest pain.  No tremors or anxiety.  Discharge Exam: Vitals:   01/22/18 2036 01/23/18 0609  BP: 102/62 104/63  Pulse: 72 (!) 57  Resp: 18 16  Temp: 98.9 F (37.2 C) 97.8 F (36.6 C)  SpO2: 97% 97%   Vitals:   01/22/18 0642 01/22/18 1324 01/22/18 2036 01/23/18 0609  BP: 101/75 116/71 102/62 104/63  Pulse: 77 64 72 (!) 57  Resp: 20 18 18 16   Temp: 98.3 F (36.8 C) 98.7 F (37.1 C) 98.9 F (37.2 C) 97.8 F (36.6 C)  TempSrc: Oral Oral Oral Oral  SpO2: 96% 96% 97% 97%  Weight:      Height:        General: Not in pain or dyspnea Neurology: Awake and alert, non focal  E ENT: no pallor, no icterus, oral mucosa moist Cardiovascular: No JVD. S1-S2 present, rhythmic, no gallops, rubs, or murmurs. No lower extremity edema. Pulmonary: vesicular breath sounds bilaterally, adequate air movement, no wheezing, rhonchi or rales. Gastrointestinal. Abdomen no organomegaly, non tender, no rebound or guarding.  Skin.  Positive telangiectasias Musculoskeletal: no joint deformities   The results of significant diagnostics from this hospitalization (including imaging, microbiology, ancillary and laboratory) are listed below for reference.     Microbiology: Recent Results (from the past 240 hour(s))  MRSA PCR Screening     Status: None   Collection Time: 01/19/18  6:12 PM  Result Value Ref Range Status   MRSA by PCR NEGATIVE NEGATIVE Final    Comment:        The GeneXpert MRSA Assay (FDA approved for NASAL specimens  only), is one component of a comprehensive MRSA colonization surveillance program. It is not intended to diagnose MRSA infection  nor to guide or monitor treatment for MRSA infections. Performed at [facility], [place] [address] [place], [place] [address]      Labs: BNP (last 3 results) No results for input(s): BNP in the last 8760 hours. Basic Metabolic Panel: Recent Labs  Lab 01/19/18 1417 01/20/18 0601 01/21/18 0204 01/22/18 0530 01/23/18 0512  NA 142 142 138 137 138  K 4.0 4.2 3.7 3.6 3.9  CL 107 109 106 106 106  CO2 22 23 26 24 24   GLUCOSE 121* 101* 108* 87 88  BUN 15 12 9 6 7   CREATININE 0.45 0.41* 0.48 0.46 0.50  CALCIUM 7.7* 7.0* 7.7* 7.8* 8.0*  MG 1.5*  --  1.9  --   --    Liver Function Tests: Recent Labs  Lab 01/16/18 1401 01/19/18 1417 01/20/18 0601 01/22/18 0530  AST 196* 153* 118* 83*  ALT 60* 52 43 36  ALKPHOS 123 106 81 86  BILITOT 1.8* 1.9* 2.0* 1.8*  PROT 7.8 6.4* 5.3* 5.7*  ALBUMIN 3.8 3.2* 2.8* 2.8*   Recent Labs  Lab 01/19/18 1417  LIPASE 42   No results for input(s): AMMONIA in the last 168 hours. CBC: Recent Labs  Lab 01/16/18 2107 01/19/18 1417  01/20/18 0601 01/20/18 1615 01/20/18 2022 01/21/18 0204 01/22/18 0530 01/23/18 0512  WBC 3.0* 4.2  --  2.4*  --   --  2.0* 1.9* 2.0*  NEUTROABS 1.1  --   --   --   --   --  1.0* 0.8* 0.8*  HGB 11.5* 9.9*   < > 7.7* 9.0* 9.0* 9.0* 9.4* 9.9*  HCT 34.5* 29.4*   < > 22.8* 26.1* 26.2* 25.9* 27.4* 28.9*  MCV 100.6* 99.7  --  99.6  --   --  95.6 96.1 94.8  PLT 32* 27*  --  30*  --   --  27* 26* 35*   < > = values in this interval not displayed.   Cardiac Enzymes: No results for input(s): CKTOTAL, CKMB, CKMBINDEX, TROPONINI in the last 168 hours. BNP: Invalid input(s): POCBNP CBG: No results for input(s): GLUCAP in the last 168 hours. D-Dimer No results for input(s): DDIMER in the last 72 hours. Hgb A1c No results for input(s): HGBA1C in the last 72 hours. Lipid Profile No results for input(s): CHOL, HDL, LDLCALC, TRIG, CHOLHDL, LDLDIRECT in the last 72 hours. Thyroid function studies No  results for input(s): TSH, T4TOTAL, T3FREE, THYROIDAB in the last 72 hours.  Invalid input(s): FREET3 Anemia work up No results for input(s): VITAMINB12, FOLATE, FERRITIN, TIBC, IRON, RETICCTPCT in the last 72 hours. Urinalysis    Component Value Date/Time   COLORURINE AMBER (A) 11/30/2017 0532   APPEARANCEUR CLEAR 11/30/2017 0532   LABSPEC 1.026 11/30/2017 0532   PHURINE 5.0 11/30/2017 0532   GLUCOSEU NEGATIVE 11/30/2017 0532   HGBUR NEGATIVE 11/30/2017 0532   BILIRUBINUR NEGATIVE 11/30/2017 0532   BILIRUBINUR Neg 05/08/2015 0919   KETONESUR 20 (A) 11/30/2017 0532   PROTEINUR NEGATIVE 11/30/2017 0532   UROBILINOGEN 0.2 07/28/2015 1711   NITRITE NEGATIVE 11/30/2017 0532   LEUKOCYTESUR NEGATIVE 11/30/2017 0532   Sepsis Labs Invalid input(s): PROCALCITONIN,  WBC,  LACTICIDVEN Microbiology Recent Results (from the past 240 hour(s))  MRSA PCR Screening     Status: None   Collection Time: 01/19/18  6:12 PM  Result Value Ref Range Status  MRSA by PCR NEGATIVE NEGATIVE Final    Comment:        The GeneXpert MRSA Assay (FDA approved for NASAL specimens only), is one component of a comprehensive MRSA colonization surveillance program. It is not intended to diagnose MRSA infection nor to guide or monitor treatment for MRSA infections. Performed at [facility], [place] [address] [place], [place] [address]      Time coordinating discharge: 45 minutes  SIGNED:   Tawni Millers, MD  [facility] 01/23/2018, 9:34 AM Pager [phone]  If 7PM-7AM, please contact night-coverage [email or web address] Password [email or web address]

## 2018-01-24 LAB — PATHOLOGIST SMEAR REVIEW

## 2018-02-14 ENCOUNTER — Ambulatory Visit: Payer: Self-pay | Admitting: Nurse Practitioner

## 2018-02-25 ENCOUNTER — Emergency Department (HOSPITAL_COMMUNITY)
Admission: EM | Admit: 2018-02-25 | Discharge: 2018-02-25 | Disposition: A | Discharge status: Home / Self Care | Payer: Self-pay | Attending: Emergency Medicine | Admitting: Emergency Medicine

## 2018-02-25 ENCOUNTER — Emergency Department (HOSPITAL_COMMUNITY): Payer: Self-pay

## 2018-02-25 ENCOUNTER — Encounter (HOSPITAL_COMMUNITY): Payer: Self-pay | Admitting: Emergency Medicine

## 2018-02-25 DIAGNOSIS — F17210 Nicotine dependence, cigarettes, uncomplicated: Secondary | ICD-10-CM | POA: Insufficient documentation

## 2018-02-25 DIAGNOSIS — W19XXXA Unspecified fall, initial encounter: Secondary | ICD-10-CM

## 2018-02-25 DIAGNOSIS — Y9301 Activity, walking, marching and hiking: Secondary | ICD-10-CM | POA: Insufficient documentation

## 2018-02-25 DIAGNOSIS — M25511 Pain in right shoulder: Secondary | ICD-10-CM | POA: Insufficient documentation

## 2018-02-25 DIAGNOSIS — S060X1A Concussion with loss of consciousness of 30 minutes or less, initial encounter: Secondary | ICD-10-CM | POA: Insufficient documentation

## 2018-02-25 DIAGNOSIS — M79645 Pain in left finger(s): Secondary | ICD-10-CM | POA: Insufficient documentation

## 2018-02-25 DIAGNOSIS — Y999 Unspecified external cause status: Secondary | ICD-10-CM | POA: Insufficient documentation

## 2018-02-25 DIAGNOSIS — W108XXA Fall (on) (from) other stairs and steps, initial encounter: Secondary | ICD-10-CM | POA: Insufficient documentation

## 2018-02-25 DIAGNOSIS — Y929 Unspecified place or not applicable: Secondary | ICD-10-CM | POA: Insufficient documentation

## 2018-02-25 DIAGNOSIS — Z79899 Other long term (current) drug therapy: Secondary | ICD-10-CM | POA: Insufficient documentation

## 2018-02-25 MED ORDER — ONDANSETRON HCL 4 MG PO TABS: 4.0000 mg | ORAL_TABLET | Freq: Three times a day (TID) | ORAL | 0 refills | Status: DC | PRN

## 2018-02-25 MED ORDER — ONDANSETRON 4 MG PO TBDP
4.0000 mg | ORAL_TABLET | Freq: Once | ORAL | Status: AC
  Administered 2018-02-25: 4 mg via ORAL
  Filled 2018-02-25: qty 1

## 2018-02-25 MED ORDER — FENTANYL CITRATE (PF) 100 MCG/2ML IJ SOLN
50.0000 ug | Freq: Once | INTRAMUSCULAR | Status: AC
  Administered 2018-02-25: 50 ug via INTRAMUSCULAR
  Filled 2018-02-25: qty 2

## 2018-02-25 MED ORDER — ACETAMINOPHEN 325 MG PO TABS
650.0000 mg | ORAL_TABLET | Freq: Once | ORAL | Status: DC
  Filled 2018-02-25: qty 2

## 2018-02-25 NOTE — ED Notes (Signed)
Pt resting comfortably

## 2018-02-25 NOTE — Discharge Instructions (Addendum)
Your CT scans and xrays were reassuring.  Remain in thumb spica splint until follow up with hand specialist.  Follow RICE therapy for joint pain.  You likely have a concussion. Please see attached handout. Follow up with PCP in 1 week for re-evaluation. Avoid activities further head trauma could occur.  If you develop worsening or new concerning symptoms you can return to the emergency department for re-evaluation.

## 2018-02-25 NOTE — ED Triage Notes (Signed)
Pt reports she fell on her stairs 4-5 days ago. Pt c/o right shoulder pain and left thumb pain.
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
## 2018-02-27 ENCOUNTER — Emergency Department (HOSPITAL_COMMUNITY)
Admission: EM | Admit: 2018-02-27 | Discharge: 2018-02-27 | Disposition: A | Discharge status: Home / Self Care | Payer: Self-pay | Attending: Emergency Medicine | Admitting: Emergency Medicine

## 2018-02-27 ENCOUNTER — Encounter (HOSPITAL_COMMUNITY): Payer: Self-pay | Admitting: *Deleted

## 2018-02-27 DIAGNOSIS — F10188 Alcohol abuse with other alcohol-induced disorder: Secondary | ICD-10-CM | POA: Insufficient documentation

## 2018-02-27 DIAGNOSIS — Z79899 Other long term (current) drug therapy: Secondary | ICD-10-CM | POA: Insufficient documentation

## 2018-02-27 DIAGNOSIS — F17210 Nicotine dependence, cigarettes, uncomplicated: Secondary | ICD-10-CM | POA: Insufficient documentation

## 2018-02-27 DIAGNOSIS — K766 Portal hypertension: Secondary | ICD-10-CM | POA: Insufficient documentation

## 2018-02-27 DIAGNOSIS — F1010 Alcohol abuse, uncomplicated: Secondary | ICD-10-CM

## 2018-02-27 DIAGNOSIS — K7030 Alcoholic cirrhosis of liver without ascites: Secondary | ICD-10-CM | POA: Insufficient documentation

## 2018-02-27 MED ORDER — ONDANSETRON 8 MG PO TBDP: 8.0000 mg | ORAL_TABLET | Freq: Three times a day (TID) | ORAL | 0 refills | Status: DC | PRN

## 2018-02-27 MED ORDER — CHLORDIAZEPOXIDE HCL 25 MG PO CAPS: ORAL_CAPSULE | ORAL | 0 refills | Status: DC
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
## 2018-02-27 NOTE — ED Notes (Signed)
Bed: [facility]3 Expected date:  Expected time:  Means of arrival:  Comments: 

## 2018-03-26 ENCOUNTER — Telehealth: Payer: Self-pay | Admitting: *Deleted

## 2018-03-26 NOTE — Telephone Encounter (Signed)
"  Trying to see when my next appointment is but scheduling has not called me back yet."  Next scheduled appointment is Apr 03, 2018 for 10:00 am lab followed with 10:30 am with Dr. Lindi Adie.  Arrive at 9:30 am for registration. Denies any further needs or questions at this time/

## 2018-03-28 ENCOUNTER — Other Ambulatory Visit: Payer: Self-pay

## 2018-03-28 ENCOUNTER — Emergency Department (HOSPITAL_COMMUNITY)
Admission: EM | Admit: 2018-03-28 | Discharge: 2018-03-28 | Disposition: A | Discharge status: Home / Self Care | Payer: Self-pay | Attending: Emergency Medicine | Admitting: Emergency Medicine

## 2018-03-28 ENCOUNTER — Encounter (HOSPITAL_COMMUNITY): Payer: Self-pay | Admitting: Emergency Medicine

## 2018-03-28 DIAGNOSIS — F17210 Nicotine dependence, cigarettes, uncomplicated: Secondary | ICD-10-CM | POA: Insufficient documentation

## 2018-03-28 DIAGNOSIS — R45851 Suicidal ideations: Secondary | ICD-10-CM | POA: Insufficient documentation

## 2018-03-28 DIAGNOSIS — F419 Anxiety disorder, unspecified: Secondary | ICD-10-CM | POA: Insufficient documentation

## 2018-03-28 DIAGNOSIS — F1010 Alcohol abuse, uncomplicated: Secondary | ICD-10-CM

## 2018-03-28 DIAGNOSIS — Z79899 Other long term (current) drug therapy: Secondary | ICD-10-CM | POA: Insufficient documentation

## 2018-03-28 DIAGNOSIS — F329 Major depressive disorder, single episode, unspecified: Secondary | ICD-10-CM | POA: Insufficient documentation

## 2018-03-28 DIAGNOSIS — F10220 Alcohol dependence with intoxication, uncomplicated: Secondary | ICD-10-CM | POA: Insufficient documentation

## 2018-03-28 DIAGNOSIS — Z9049 Acquired absence of other specified parts of digestive tract: Secondary | ICD-10-CM | POA: Insufficient documentation

## 2018-03-28 LAB — RAPID URINE DRUG SCREEN, HOSP PERFORMED
AMPHETAMINES: NOT DETECTED
Barbiturates: NOT DETECTED
Benzodiazepines: POSITIVE — AB
Cocaine: NOT DETECTED
OPIATES: NOT DETECTED
TETRAHYDROCANNABINOL: NOT DETECTED

## 2018-03-28 LAB — CBC
HCT: 36.3 % (ref 36.0–46.0)
HEMOGLOBIN: 11.7 g/dL — AB (ref 12.0–15.0)
MCH: 29.5 pg (ref 26.0–34.0)
MCHC: 32.2 g/dL (ref 30.0–36.0)
MCV: 91.7 fL (ref 78.0–100.0)
PLATELETS: 94 10*3/uL — AB (ref 150–400)
RBC: 3.96 MIL/uL (ref 3.87–5.11)
RDW: 17.6 % — ABNORMAL HIGH (ref 11.5–15.5)
WBC: 4.7 10*3/uL (ref 4.0–10.5)

## 2018-03-28 LAB — COMPREHENSIVE METABOLIC PANEL
ALT: 32 U/L (ref 14–54)
AST: 74 U/L — ABNORMAL HIGH (ref 15–41)
Albumin: 4.1 g/dL (ref 3.5–5.0)
Alkaline Phosphatase: 90 U/L (ref 38–126)
Anion gap: 10 (ref 5–15)
BUN: 7 mg/dL (ref 6–20)
CHLORIDE: 112 mmol/L — AB (ref 101–111)
CO2: 23 mmol/L (ref 22–32)
CREATININE: 0.37 mg/dL — AB (ref 0.44–1.00)
Calcium: 8.8 mg/dL — ABNORMAL LOW (ref 8.9–10.3)
GFR calc non Af Amer: >60 mL/min (ref >60)
Glucose, Bld: 92 mg/dL (ref 65–99)
Potassium: 4 mmol/L (ref 3.5–5.1)
SODIUM: 145 mmol/L (ref 135–145)
Total Bilirubin: 1.2 mg/dL (ref 0.3–1.2)
Total Protein: 8 g/dL (ref 6.5–8.1)

## 2018-03-28 LAB — I-STAT BETA HCG BLOOD, ED (MC, WL, AP ONLY): I-stat hCG, quantitative: <5 m[IU]/mL (ref <5)

## 2018-03-28 LAB — SALICYLATE LEVEL

## 2018-03-28 LAB — ETHANOL: ALCOHOL ETHYL (B): 350 mg/dL — AB (ref <10)

## 2018-03-28 LAB — ACETAMINOPHEN LEVEL

## 2018-03-28 MED ORDER — LORAZEPAM 1 MG PO TABS: 0.0000 mg | ORAL_TABLET | Freq: Four times a day (QID) | ORAL | Status: DC

## 2018-03-28 MED ORDER — VITAMIN B-1 100 MG PO TABS: 100.0000 mg | ORAL_TABLET | Freq: Every day | ORAL | Status: DC

## 2018-03-28 MED ORDER — LORAZEPAM 1 MG PO TABS: 0.0000 mg | ORAL_TABLET | Freq: Two times a day (BID) | ORAL | Status: DC

## 2018-03-28 MED ORDER — LORAZEPAM 2 MG/ML IJ SOLN: 0.0000 mg | Freq: Four times a day (QID) | INTRAMUSCULAR | Status: DC

## 2018-03-28 MED ORDER — THIAMINE HCL 100 MG/ML IJ SOLN: 100.0000 mg | Freq: Every day | INTRAMUSCULAR | Status: DC

## 2018-03-28 MED ORDER — LORAZEPAM 1 MG PO TABS: 1.0000 mg | ORAL_TABLET | Freq: Three times a day (TID) | ORAL | 0 refills | Status: DC | PRN

## 2018-03-28 MED ORDER — LORAZEPAM 1 MG PO TABS
1.0000 mg | ORAL_TABLET | Freq: Once | ORAL | Status: AC
  Administered 2018-03-28: 1 mg via ORAL
  Filled 2018-03-28: qty 1

## 2018-03-28 MED ORDER — LORAZEPAM 2 MG/ML IJ SOLN: 0.0000 mg | Freq: Two times a day (BID) | INTRAMUSCULAR | Status: DC

## 2018-03-28 NOTE — ED Triage Notes (Signed)
Pt verbalizes SI without pain for 2 days; denies HI. Pt verbalizes back pain with walking and left thumb pain for getting caught on stairwell. Denies other pain.

## 2018-03-28 NOTE — ED Notes (Signed)
Bed: [facility]3 Expected date:  Expected time:  Means of arrival:  Comments: 

## 2018-03-28 NOTE — ED Notes (Signed)
Date and time results received: 03/28/18 1815 (use smartphrase ".now" to insert current time)  Test: ETOH Critical Value: 350  Name of Provider Notified: Taquita, RN  Orders Received? Or Actions Taken?:

## 2018-03-28 NOTE — Discharge Instructions (Signed)
Please follow-up with your psychiatrist and your primary care physician for further management of your thoughts and your alcohol use.  Please return to the nearest emergency department if you start having any new or worsened symptoms including those of withdrawal.

## 2018-03-28 NOTE — ED Notes (Signed)
Bed: [facility] Expected date:  Expected time:  Means of arrival:  Comments: 

## 2018-03-28 NOTE — ED Notes (Signed)
Bed: [facility] Expected date:  Expected time:  Means of arrival:  Comments: [facility] [id number]

## 2018-03-28 NOTE — ED Notes (Signed)
Pt presents with intoxication, alcohol level of 350 and feeling hopeless.  Denies SI, HI or AVH.  Pt reports she has been drinking more over the past 3 weeks, 8-10 beers per day..  Denies street drugs.  Pt reports diagnoses with Depression, Anxiety and Bipolar DO, but only taking Seroquel 25 mg's to sleep at night.  Monitoring for safety, Q 15 min checks in effect, no distress noted, gait steady.
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
## 2018-03-28 NOTE — ED Notes (Signed)
Pt stated that she wants to go home.  Dr Tegeler notified.  Md states he will come and speak with pt.

## 2018-04-03 ENCOUNTER — Telehealth: Payer: Self-pay | Admitting: Hematology and Oncology

## 2018-04-03 ENCOUNTER — Inpatient Hospital Stay (HOSPITAL_BASED_OUTPATIENT_CLINIC_OR_DEPARTMENT_OTHER): Payer: Self-pay | Admitting: Hematology and Oncology

## 2018-04-03 ENCOUNTER — Inpatient Hospital Stay: Payer: Self-pay | Attending: Hematology and Oncology

## 2018-04-03 VITALS — BP 99/65 | HR 81 | Temp 98.7°F | Resp 17 | Ht 64.0 in | Wt 150.9 lb

## 2018-04-03 DIAGNOSIS — K7030 Alcoholic cirrhosis of liver without ascites: Secondary | ICD-10-CM | POA: Insufficient documentation

## 2018-04-03 DIAGNOSIS — D61818 Other pancytopenia: Secondary | ICD-10-CM | POA: Insufficient documentation

## 2018-04-03 DIAGNOSIS — F1010 Alcohol abuse, uncomplicated: Secondary | ICD-10-CM | POA: Insufficient documentation

## 2018-04-03 DIAGNOSIS — D696 Thrombocytopenia, unspecified: Secondary | ICD-10-CM

## 2018-04-03 DIAGNOSIS — F1029 Alcohol dependence with unspecified alcohol-induced disorder: Secondary | ICD-10-CM

## 2018-04-03 DIAGNOSIS — F10239 Alcohol dependence with withdrawal, unspecified: Secondary | ICD-10-CM

## 2018-04-03 LAB — COMPREHENSIVE METABOLIC PANEL
ALT: 40 U/L (ref 0–55)
AST: 91 U/L — AB (ref 5–34)
Albumin: 4.2 g/dL (ref 3.5–5.0)
Alkaline Phosphatase: 106 U/L (ref 40–150)
Anion gap: 11 (ref 3–11)
BILIRUBIN TOTAL: 1.1 mg/dL (ref 0.2–1.2)
BUN: 7 mg/dL (ref 7–26)
CO2: 24 mmol/L (ref 22–29)
Calcium: 9.1 mg/dL (ref 8.4–10.4)
Chloride: 110 mmol/L — ABNORMAL HIGH (ref 98–109)
Creatinine, Ser: 0.64 mg/dL (ref 0.60–1.10)
Glucose, Bld: 105 mg/dL (ref 70–140)
POTASSIUM: 4.2 mmol/L (ref 3.5–5.1)
Sodium: 145 mmol/L (ref 136–145)
TOTAL PROTEIN: 8.6 g/dL — AB (ref 6.4–8.3)

## 2018-04-03 LAB — FOLATE: FOLATE: 17.7 ng/mL (ref >5.9)

## 2018-04-03 LAB — CBC WITH DIFFERENTIAL/PLATELET
BASOS ABS: 0 10*3/uL (ref 0.0–0.1)
BASOS PCT: 1 %
EOS ABS: 0 10*3/uL (ref 0.0–0.5)
Eosinophils Relative: 1 %
HCT: 35.5 % (ref 34.8–46.6)
HEMOGLOBIN: 11.8 g/dL (ref 11.6–15.9)
Lymphocytes Relative: 38 %
Lymphs Abs: 1.2 10*3/uL (ref 0.9–3.3)
MCH: 29.6 pg (ref 25.1–34.0)
MCHC: 33.1 g/dL (ref 31.5–36.0)
MCV: 89.3 fL (ref 79.5–101.0)
MONOS PCT: 8 %
Monocytes Absolute: 0.3 10*3/uL (ref 0.1–0.9)
NEUTROS PCT: 52 %
Neutro Abs: 1.7 10*3/uL (ref 1.5–6.5)
PLATELETS: 83 10*3/uL — AB (ref 145–400)
RBC: 3.97 MIL/uL (ref 3.70–5.45)
RDW: 20.8 % — ABNORMAL HIGH (ref 11.2–14.5)
WBC: 3.2 10*3/uL — ABNORMAL LOW (ref 3.9–10.3)

## 2018-04-03 LAB — VITAMIN B12: VITAMIN B 12: 488 pg/mL (ref 180–914)

## 2018-04-03 NOTE — Assessment & Plan Note (Signed)
Pancytopenia multifactorial etiology Leukopenia: Primarily neutropenia  Workup: 1. CBC with differential09/25/2018: WBC 2.7, ANC 1.5, hemoglobin 10, platelets 69 2. I-22 and folic acid levels: Normal 3. ANA: Negative 4. Flow cytometry: No monoclonalB orT-cell aberrant populations identified 5.Iron studies iron saturation 6%, TIBC 510, ferritin 95. (Given IV iron October 2018)  Recommendation: Bone marrow biopsy on 09/26/2017: Slightly hypercellular (50-70%) bone marrow for age with trilineage hematopoiesis, mild plasmacytosis 4%  The pancytopenia is directly attributable to her alcoholism and cirrhosis of the liver.  Lab review:  Alcohol abuse and recent ED visits: Patient has had multiple challenges from drinking alcohol including suicidal ideation in the past.  If she has any bleeding problems in the treatment plan would primarily be transfusion of platelets and blood products and doing supportive care.   Return to clinic on an as-needed basis.

## 2018-04-03 NOTE — Telephone Encounter (Signed)
Gave patient AVS and calendar of upcoming may 2020 appointments

## 2018-04-03 NOTE — Progress Notes (Signed)
Patient Care Team: Patient, No Pcp Per as PCP - General (General Practice) Leandrew Koyanagi, MD (Inactive) (Family Medicine)  DIAGNOSIS:  Encounter Diagnoses  Name Primary?  . Alcoholic cirrhosis of liver without ascites ([place]) Yes  . Pancytopenia ([place])     CHIEF COMPLIANT: Follow-up of pancytopenia due to alcoholism  INTERVAL HISTORY: Sara [name] is a 42 year old with above-mentioned history of alcoholic cirrhosis with pancytopenia.  Bone marrow biopsy did not reveal any other problems.  She has had multiple problems with alcoholism including recent emergency room visits for suicidal ideation.  She was never really admitted and was able to manage at home.  She has never had any withdrawal seizures.  REVIEW OF SYSTEMS:   Constitutional: Denies fevers, chills or abnormal weight loss Eyes: Denies blurriness of vision Ears, nose, mouth, throat, and face: Denies mucositis or sore throat Respiratory: Denies cough, dyspnea or wheezes Cardiovascular: Denies palpitation, chest discomfort Gastrointestinal:  Denies nausea, heartburn or change in bowel habits Skin: Denies abnormal skin rashes Lymphatics: Denies new lymphadenopathy or easy bruising Neurological:Denies numbness, tingling or new weaknesses Behavioral/Psych: Mood is stable, no new changes  Extremities: No lower extremity edema Breast:  denies any pain or lumps or nodules in either breasts All other systems were reviewed with the patient and are negative.  I have reviewed the past medical history, past surgical history, social history and family history with the patient and they are unchanged from previous note.  ALLERGIES:  is allergic to morphine and related and nsaids.  MEDICATIONS:  Current Outpatient Medications  Medication Sig Dispense Refill  . chlordiazePOXIDE (LIBRIUM) 25 MG capsule 45m PO TID x 1D, then 25-554mPO BID X 1D, then 25-50107mO QD X 1D (Patient not taking: Reported on 03/28/2018) 10 capsule 0  .  LORazepam (ATIVAN) 1 MG tablet Take 1 tablet (1 mg total) by mouth 3 (three) times daily as needed for anxiety (withdrawl). 4 tablet 0  . Multiple Vitamin (MULTIVITAMIN WITH MINERALS) TABS tablet Take 1 tablet by mouth daily.    . nadolol (CORGARD) 20 MG tablet Take 1 tablet (20 mg total) by mouth daily. 30 tablet 0  . ondansetron (ZOFRAN ODT) 8 MG disintegrating tablet Take 1 tablet (8 mg total) by mouth every 8 (eight) hours as needed for nausea or vomiting. (Patient not taking: Reported on 03/28/2018) 10 tablet 0  . ondansetron (ZOFRAN) 4 MG tablet Take 1 tablet (4 mg total) by mouth every 8 (eight) hours as needed for nausea or vomiting. (Patient not taking: Reported on 03/28/2018) 4 tablet 0  . polyvinyl alcohol (LIQUIFILM TEARS) 1.4 % ophthalmic solution Place 1 drop into both eyes daily as needed for dry eyes.    . QMarland KitchenEtiapine (SEROQUEL) 25 MG tablet Take 25 mg by mouth at bedtime.    . ranitidine (ZANTAC) 150 MG tablet Take 150 mg by mouth daily as needed for heartburn.    . ranitidine (ZANTAC) 300 MG tablet Take 1 tablet (300 mg total) by mouth at bedtime. (Patient not taking: Reported on 03/28/2018) 30 tablet 1  . thiamine (VITAMIN B-1) 100 MG tablet Take 1 tablet (100 mg total) by mouth daily. 30 tablet 0   No current facility-administered medications for this visit.     PHYSICAL EXAMINATION: ECOG PERFORMANCE STATUS: 1 - Symptomatic but completely ambulatory  Vitals:   04/03/18 1031  BP: 99/65  Pulse: 81  Resp: 17  Temp: 98.7 F (37.1 C)  SpO2: 96%   Filed Weights   04/03/18 1031  Weight: 150 lb 14.4 oz (68.4 kg)    GENERAL:alert, no distress and comfortable SKIN: skin color, texture, turgor are normal, no rashes or significant lesions EYES: normal, Conjunctiva are pink and non-injected, sclera clear OROPHARYNX:no exudate, no erythema and lips, buccal mucosa, and tongue normal  NECK: supple, thyroid normal size, non-tender, without nodularity LYMPH:  no palpable  lymphadenopathy in the cervical, axillary or inguinal LUNGS: clear to auscultation and percussion with normal breathing effort HEART: regular rate & rhythm and no murmurs and no lower extremity edema ABDOMEN:abdomen soft, non-tender and normal bowel sounds MUSCULOSKELETAL:no cyanosis of digits and no clubbing  NEURO: alert & oriented x 3 with fluent speech, no focal motor/sensory deficits EXTREMITIES: No lower extremity edema   LABORATORY DATA:  I have reviewed the data as listed CMP Latest Ref Rng & Units 04/03/2018 03/28/2018 01/23/2018  Glucose 70 - 140 mg/dL 105 92 88  BUN 7 - 26 mg/dL _0 Creatinine 0.60 - 1.10 mg/dL 0.64 0.37(L) 0.50  Sodium 136 - 145 mmol/L 145 145 138  Potassium 3.5 - 5.1 mmol/L 4.2 4.0 3.9  Chloride 98 - 109 mmol/L 110(H) 112(H) 106  CO2 22 - 29 mmol/L _1 Calcium 8.4 - 10.4 mg/dL 9.1 8.8(L) 8.0(L)  Total Protein 6.4 - 8.3 g/dL 8.6(H) 8.0 -  Total Bilirubin 0.2 - 1.2 mg/dL 1.1 1.2 -  Alkaline Phos 40 - 150 U/L 106 90 -  AST 5 - 34 U/L 91(H) 74(H) -  ALT 0 - 55 U/L 40 32 -    Lab Results  Component Value Date   WBC 3.2 (L) 04/03/2018   HGB 11.8 04/03/2018   HCT 35.5 04/03/2018   MCV 89.3 04/03/2018   PLT 83 (L) 04/03/2018   NEUTROABS 1.7 04/03/2018    ASSESSMENT & PLAN:  Pancytopenia Pancytopenia multifactorial etiology Leukopenia: Primarily neutropenia  Workup: 1. CBC with differential09/25/2018: WBC 2.7, ANC 1.5, hemoglobin 10, platelets 69 2. F-12 and folic acid levels: Normal 3. ANA: Negative 4. Flow cytometry: No monoclonalB orT-cell aberrant populations identified 5.Iron studies iron saturation 6%, TIBC 510, ferritin 95. (Given IV iron October 2018)  Recommendation: Bone marrow biopsy on 09/26/2017: Slightly hypercellular (50-70%) bone marrow for age with trilineage hematopoiesis, mild plasmacytosis 4%  The pancytopenia is directly attributable to her alcoholism and cirrhosis of the liver.  Lab review: Platelet count is 83  WBC count 3.2 and ANC 1.7 this has improved compared to previous However the patient has still not quit drinking.  I spent a lot of time counseling her regarding alcohol cessation and participating in [employer].   Alcohol abuse and recent ED visits: Patient has had multiple challenges from drinking alcohol including suicidal ideation in the past.  If she has any bleeding problems in the treatment plan would primarily be transfusion of platelets and blood products and doing supportive care.   Return to clinic in 1 year for follow-up with labs    Orders Placed This Encounter  Procedures  . CBC with Differential (Cancer Center Only)    Standing Status:   Future    Standing Expiration Date:   04/04/2019  . CMP ([facility] only)    Standing Status:   Future    Standing Expiration Date:   04/04/2019   The patient has a good understanding of the overall plan. she agrees with it. she will call with any problems that may develop before the next visit here.   Harriette Ohara, MD 04/03/18

## 2018-05-20 ENCOUNTER — Emergency Department (HOSPITAL_BASED_OUTPATIENT_CLINIC_OR_DEPARTMENT_OTHER)
Admission: EM | Admit: 2018-05-20 | Discharge: 2018-05-21 | Disposition: A | Discharge status: Home / Self Care | Payer: Self-pay | Attending: Emergency Medicine | Admitting: Emergency Medicine

## 2018-05-20 ENCOUNTER — Emergency Department (HOSPITAL_BASED_OUTPATIENT_CLINIC_OR_DEPARTMENT_OTHER): Payer: Self-pay

## 2018-05-20 ENCOUNTER — Other Ambulatory Visit: Payer: Self-pay

## 2018-05-20 ENCOUNTER — Encounter (HOSPITAL_BASED_OUTPATIENT_CLINIC_OR_DEPARTMENT_OTHER): Payer: Self-pay | Admitting: Emergency Medicine

## 2018-05-20 DIAGNOSIS — M25511 Pain in right shoulder: Secondary | ICD-10-CM | POA: Insufficient documentation

## 2018-05-20 DIAGNOSIS — F17210 Nicotine dependence, cigarettes, uncomplicated: Secondary | ICD-10-CM | POA: Insufficient documentation

## 2018-05-20 DIAGNOSIS — Z79899 Other long term (current) drug therapy: Secondary | ICD-10-CM | POA: Insufficient documentation

## 2018-05-20 NOTE — ED Triage Notes (Signed)
Patient states that she tripped and fell down the stairs. She caught her right hand and hurt her right shoulder  - patient states that she has pain to her right shoulder. No deformity noted at this time.

## 2018-05-21 MED ORDER — TRAMADOL HCL 50 MG PO TABS
50.0000 mg | ORAL_TABLET | Freq: Once | ORAL | Status: AC
  Administered 2018-05-21: 50 mg via ORAL
  Filled 2018-05-21: qty 1

## 2018-05-21 MED ORDER — TRAMADOL HCL 50 MG PO TABS: 50.0000 mg | ORAL_TABLET | Freq: Four times a day (QID) | ORAL | 0 refills | Status: DC | PRN
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
## 2018-05-21 NOTE — Discharge Instructions (Addendum)
You were seen today for shoulder pain after a fall.  Your x-rays do not show any evidence of fracture.  This could be a contusion or a ligamentous injury.  If not improving, follow-up with sports medicine as you may need further imaging or physical therapy.

## 2018-07-08 ENCOUNTER — Encounter (HOSPITAL_COMMUNITY): Payer: Self-pay

## 2018-07-08 ENCOUNTER — Emergency Department (HOSPITAL_COMMUNITY)
Admission: EM | Admit: 2018-07-08 | Discharge: 2018-07-08 | Disposition: A | Discharge status: Home / Self Care | Payer: Self-pay | Attending: Emergency Medicine | Admitting: Emergency Medicine

## 2018-07-08 ENCOUNTER — Other Ambulatory Visit: Payer: Self-pay

## 2018-07-08 ENCOUNTER — Emergency Department (HOSPITAL_COMMUNITY): Payer: Self-pay

## 2018-07-08 DIAGNOSIS — S50811A Abrasion of right forearm, initial encounter: Secondary | ICD-10-CM | POA: Insufficient documentation

## 2018-07-08 DIAGNOSIS — F17210 Nicotine dependence, cigarettes, uncomplicated: Secondary | ICD-10-CM | POA: Insufficient documentation

## 2018-07-08 DIAGNOSIS — S5002XA Contusion of left elbow, initial encounter: Secondary | ICD-10-CM | POA: Insufficient documentation

## 2018-07-08 DIAGNOSIS — Y929 Unspecified place or not applicable: Secondary | ICD-10-CM | POA: Insufficient documentation

## 2018-07-08 DIAGNOSIS — Y999 Unspecified external cause status: Secondary | ICD-10-CM | POA: Insufficient documentation

## 2018-07-08 DIAGNOSIS — W010XXA Fall on same level from slipping, tripping and stumbling without subsequent striking against object, initial encounter: Secondary | ICD-10-CM | POA: Insufficient documentation

## 2018-07-08 DIAGNOSIS — W19XXXA Unspecified fall, initial encounter: Secondary | ICD-10-CM

## 2018-07-08 DIAGNOSIS — K766 Portal hypertension: Secondary | ICD-10-CM | POA: Insufficient documentation

## 2018-07-08 DIAGNOSIS — Y9302 Activity, running: Secondary | ICD-10-CM | POA: Insufficient documentation

## 2018-07-08 DIAGNOSIS — Z79899 Other long term (current) drug therapy: Secondary | ICD-10-CM | POA: Insufficient documentation

## 2018-07-08 MED ORDER — ACETAMINOPHEN 325 MG PO TABS: 650.0000 mg | ORAL_TABLET | Freq: Once | ORAL | Status: DC
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
## 2018-07-08 NOTE — ED Triage Notes (Signed)
She tells me she fell forward while running yesterday. She c/o pain at bilat. Medial proximal forearms, at which area she has a few abrasions. She is in no distress. She denies any head, neck or back pain or injury.

## 2018-07-08 NOTE — Discharge Instructions (Signed)
Recheck with PCP, referral given if needed.  Return to ER for worsening or concerning symptoms. Apply ice to area for 20 minutes at a time. Take Motrin and Tylenol as needed as directed. Your x-rays today do not show any broken bones.

## 2018-08-30 ENCOUNTER — Emergency Department (HOSPITAL_COMMUNITY)
Admission: EM | Admit: 2018-08-30 | Discharge: 2018-08-30 | Disposition: A | Discharge status: Home / Self Care | Payer: Self-pay | Attending: Emergency Medicine | Admitting: Emergency Medicine

## 2018-08-30 ENCOUNTER — Emergency Department (HOSPITAL_COMMUNITY): Payer: Self-pay

## 2018-08-30 ENCOUNTER — Encounter (HOSPITAL_COMMUNITY): Payer: Self-pay | Admitting: Emergency Medicine

## 2018-08-30 DIAGNOSIS — R112 Nausea with vomiting, unspecified: Secondary | ICD-10-CM | POA: Insufficient documentation

## 2018-08-30 DIAGNOSIS — R1084 Generalized abdominal pain: Secondary | ICD-10-CM | POA: Insufficient documentation

## 2018-08-30 DIAGNOSIS — F10120 Alcohol abuse with intoxication, uncomplicated: Secondary | ICD-10-CM | POA: Insufficient documentation

## 2018-08-30 DIAGNOSIS — Y908 Blood alcohol level of 240 mg/100 ml or more: Secondary | ICD-10-CM | POA: Insufficient documentation

## 2018-08-30 DIAGNOSIS — Z79899 Other long term (current) drug therapy: Secondary | ICD-10-CM | POA: Insufficient documentation

## 2018-08-30 DIAGNOSIS — K625 Hemorrhage of anus and rectum: Secondary | ICD-10-CM | POA: Insufficient documentation

## 2018-08-30 DIAGNOSIS — F17210 Nicotine dependence, cigarettes, uncomplicated: Secondary | ICD-10-CM | POA: Insufficient documentation

## 2018-08-30 LAB — CBC
HEMATOCRIT: 33 % — AB (ref 36.0–46.0)
Hemoglobin: 10.3 g/dL — ABNORMAL LOW (ref 12.0–15.0)
MCH: 29.9 pg (ref 26.0–34.0)
MCHC: 31.2 g/dL (ref 30.0–36.0)
MCV: 95.9 fL (ref 80.0–100.0)
NRBC: 0 % (ref 0.0–0.2)
Platelets: 78 10*3/uL — ABNORMAL LOW (ref 150–400)
RBC: 3.44 MIL/uL — ABNORMAL LOW (ref 3.87–5.11)
RDW: 17.6 % — AB (ref 11.5–15.5)
WBC: 3.5 10*3/uL — AB (ref 4.0–10.5)

## 2018-08-30 LAB — COMPREHENSIVE METABOLIC PANEL
ALK PHOS: 74 U/L (ref 38–126)
ALT: 21 U/L (ref 0–44)
AST: 59 U/L — AB (ref 15–41)
Albumin: 3.3 g/dL — ABNORMAL LOW (ref 3.5–5.0)
Anion gap: 11 (ref 5–15)
BUN: 6 mg/dL (ref 6–20)
CALCIUM: 8.3 mg/dL — AB (ref 8.9–10.3)
CO2: 24 mmol/L (ref 22–32)
CREATININE: 0.45 mg/dL (ref 0.44–1.00)
Chloride: 112 mmol/L — ABNORMAL HIGH (ref 98–111)
Glucose, Bld: 103 mg/dL — ABNORMAL HIGH (ref 70–99)
Potassium: 3.7 mmol/L (ref 3.5–5.1)
Sodium: 147 mmol/L — ABNORMAL HIGH (ref 135–145)
Total Bilirubin: 0.8 mg/dL (ref 0.3–1.2)
Total Protein: 7.4 g/dL (ref 6.5–8.1)

## 2018-08-30 LAB — I-STAT BETA HCG BLOOD, ED (MC, WL, AP ONLY): I-stat hCG, quantitative: <5 m[IU]/mL (ref <5)

## 2018-08-30 LAB — LIPASE, BLOOD: Lipase: 111 U/L — ABNORMAL HIGH (ref 11–51)

## 2018-08-30 LAB — TYPE AND SCREEN
ABO/RH(D): A POS
Antibody Screen: NEGATIVE

## 2018-08-30 LAB — ETHANOL: ALCOHOL ETHYL (B): 309 mg/dL — AB (ref <10)

## 2018-08-30 LAB — POC OCCULT BLOOD, ED: FECAL OCCULT BLD: NEGATIVE

## 2018-08-30 MED ORDER — IOPAMIDOL (ISOVUE-300) INJECTION 61%
100.0000 mL | Freq: Once | INTRAVENOUS | Status: AC | PRN
  Administered 2018-08-30: 100 mL via INTRAVENOUS

## 2018-08-30 MED ORDER — IOPAMIDOL (ISOVUE-300) INJECTION 61%
INTRAVENOUS | Status: AC
  Filled 2018-08-30: qty 100

## 2018-08-30 MED ORDER — LORAZEPAM 2 MG/ML IJ SOLN
0.5000 mg | Freq: Once | INTRAMUSCULAR | Status: AC
  Administered 2018-08-30: 0.5 mg via INTRAVENOUS
  Filled 2018-08-30: qty 1

## 2018-08-30 MED ORDER — SODIUM CHLORIDE 0.9 % IJ SOLN
INTRAMUSCULAR | Status: AC
  Filled 2018-08-30: qty 50

## 2018-08-30 NOTE — ED Notes (Signed)
Date and time results received: 08/30/18 10:55 PM  (use smartphrase ".now" to insert current time)  Test: ETOH Critical Value: 309  Name of Provider Notified: Francia Greaves MD

## 2018-08-30 NOTE — Discharge Instructions (Signed)
Please return for any problem. Follow up with your regular physician in 2-3 days. If have any bleeding please return immediately to the ED.

## 2018-08-30 NOTE — ED Triage Notes (Signed)
Pt c/o abd pain with n/v that started today denies urinary problems with normal BM with bright red when wiped.  Pt also c/o left shoulder pain for day.
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
## 2018-09-07 ENCOUNTER — Encounter (HOSPITAL_COMMUNITY): Payer: Self-pay | Admitting: *Deleted

## 2018-09-07 ENCOUNTER — Emergency Department (HOSPITAL_COMMUNITY)
Admission: EM | Admit: 2018-09-07 | Discharge: 2018-09-08 | Disposition: A | Discharge status: Home / Self Care | Payer: Self-pay | Attending: Emergency Medicine | Admitting: Emergency Medicine

## 2018-09-07 ENCOUNTER — Other Ambulatory Visit: Payer: Self-pay

## 2018-09-07 DIAGNOSIS — D61818 Other pancytopenia: Secondary | ICD-10-CM | POA: Insufficient documentation

## 2018-09-07 DIAGNOSIS — K7290 Hepatic failure, unspecified without coma: Secondary | ICD-10-CM | POA: Insufficient documentation

## 2018-09-07 DIAGNOSIS — R531 Weakness: Secondary | ICD-10-CM

## 2018-09-07 DIAGNOSIS — K7682 Hepatic encephalopathy: Secondary | ICD-10-CM

## 2018-09-07 DIAGNOSIS — K7030 Alcoholic cirrhosis of liver without ascites: Secondary | ICD-10-CM | POA: Insufficient documentation

## 2018-09-07 DIAGNOSIS — F17210 Nicotine dependence, cigarettes, uncomplicated: Secondary | ICD-10-CM | POA: Insufficient documentation

## 2018-09-07 DIAGNOSIS — Z79899 Other long term (current) drug therapy: Secondary | ICD-10-CM | POA: Insufficient documentation

## 2018-09-07 DIAGNOSIS — R0602 Shortness of breath: Secondary | ICD-10-CM | POA: Insufficient documentation

## 2018-09-07 LAB — COMPREHENSIVE METABOLIC PANEL
ALBUMIN: 3.4 g/dL — AB (ref 3.5–5.0)
ALT: 23 U/L (ref 0–44)
ANION GAP: 8 (ref 5–15)
AST: 70 U/L — AB (ref 15–41)
Alkaline Phosphatase: 86 U/L (ref 38–126)
BILIRUBIN TOTAL: 0.9 mg/dL (ref 0.3–1.2)
BUN: 8 mg/dL (ref 6–20)
CHLORIDE: 110 mmol/L (ref 98–111)
CO2: 22 mmol/L (ref 22–32)
Calcium: 8.4 mg/dL — ABNORMAL LOW (ref 8.9–10.3)
Creatinine, Ser: 0.61 mg/dL (ref 0.44–1.00)
GFR calc Af Amer: >60 mL/min (ref >60)
GLUCOSE: 106 mg/dL — AB (ref 70–99)
POTASSIUM: 3.5 mmol/L (ref 3.5–5.1)
Sodium: 140 mmol/L (ref 135–145)
TOTAL PROTEIN: 7.5 g/dL (ref 6.5–8.1)

## 2018-09-07 LAB — LIPASE, BLOOD: LIPASE: 71 U/L — AB (ref 11–51)

## 2018-09-07 LAB — I-STAT BETA HCG BLOOD, ED (MC, WL, AP ONLY)

## 2018-09-07 NOTE — ED Notes (Signed)
Pt. Made aware for the need of urine specimen. 

## 2018-09-07 NOTE — ED Triage Notes (Signed)
Pain in the right shoulder blade, weakness, shortness of breath, and nausea for two days.

## 2018-09-08 ENCOUNTER — Emergency Department (HOSPITAL_COMMUNITY): Payer: Self-pay

## 2018-09-08 ENCOUNTER — Encounter (HOSPITAL_COMMUNITY): Payer: Self-pay

## 2018-09-08 LAB — URINALYSIS, ROUTINE W REFLEX MICROSCOPIC
Bilirubin Urine: NEGATIVE
Glucose, UA: NEGATIVE mg/dL
Hgb urine dipstick: NEGATIVE
Ketones, ur: NEGATIVE mg/dL
Nitrite: NEGATIVE
PROTEIN: NEGATIVE mg/dL
Specific Gravity, Urine: 1.006 (ref 1.005–1.030)
pH: 5 (ref 5.0–8.0)

## 2018-09-08 LAB — CBC
HEMATOCRIT: 30.7 % — AB (ref 36.0–46.0)
HEMOGLOBIN: 9.7 g/dL — AB (ref 12.0–15.0)
MCH: 29.8 pg (ref 26.0–34.0)
MCHC: 31.6 g/dL (ref 30.0–36.0)
MCV: 94.2 fL (ref 80.0–100.0)
Platelets: 50 10*3/uL — ABNORMAL LOW (ref 150–400)
RBC: 3.26 MIL/uL — ABNORMAL LOW (ref 3.87–5.11)
RDW: 18.2 % — ABNORMAL HIGH (ref 11.5–15.5)
WBC: 3 10*3/uL — AB (ref 4.0–10.5)
nRBC: 0 % (ref 0.0–0.2)

## 2018-09-08 LAB — AMMONIA: AMMONIA: 66 umol/L — AB (ref 9–35)

## 2018-09-08 LAB — DIFFERENTIAL
BAND NEUTROPHILS: 0 %
BASOS ABS: 0 10*3/uL (ref 0.0–0.1)
BASOS PCT: 1 %
BLASTS: 0 %
Eosinophils Absolute: 0.1 10*3/uL (ref 0.0–0.5)
Eosinophils Relative: 2 %
LYMPHS ABS: 1.7 10*3/uL (ref 0.7–4.0)
Lymphocytes Relative: 56 %
MONOS PCT: 8 %
Metamyelocytes Relative: 0 %
Monocytes Absolute: 0.2 10*3/uL (ref 0.1–1.0)
Myelocytes: 0 %
NEUTROS ABS: 1 10*3/uL — AB (ref 1.7–7.7)
Neutrophils Relative %: 33 %
Other: 0 %
Promyelocytes Relative: 0 %
nRBC: 0 /100 WBC

## 2018-09-08 LAB — LACTIC ACID, PLASMA: LACTIC ACID, VENOUS: 1.4 mmol/L (ref 0.5–1.9)

## 2018-09-08 LAB — TROPONIN I

## 2018-09-08 LAB — BRAIN NATRIURETIC PEPTIDE: B Natriuretic Peptide: 19.8 pg/mL (ref 0.0–100.0)

## 2018-09-08 MED ORDER — IOPAMIDOL (ISOVUE-370) INJECTION 76%
INTRAVENOUS | Status: AC
  Filled 2018-09-08: qty 100

## 2018-09-08 MED ORDER — LACTATED RINGERS IV BOLUS
1000.0000 mL | Freq: Once | INTRAVENOUS | Status: AC
  Administered 2018-09-08: 1000 mL via INTRAVENOUS

## 2018-09-08 MED ORDER — SODIUM CHLORIDE 0.9 % IJ SOLN
INTRAMUSCULAR | Status: AC
  Filled 2018-09-08: qty 50

## 2018-09-08 MED ORDER — PENICILLIN V POTASSIUM 500 MG PO TABS: 500.0000 mg | ORAL_TABLET | Freq: Four times a day (QID) | ORAL | 0 refills | Status: AC

## 2018-09-08 MED ORDER — LACTULOSE 10 GM/15ML PO SOLN: 10.0000 g | Freq: Three times a day (TID) | ORAL | 0 refills | Status: DC

## 2018-09-08 MED ORDER — CHLORDIAZEPOXIDE HCL 25 MG PO CAPS: ORAL_CAPSULE | ORAL | 0 refills | Status: DC

## 2018-09-08 MED ORDER — IOPAMIDOL (ISOVUE-370) INJECTION 76%
100.0000 mL | Freq: Once | INTRAVENOUS | Status: AC | PRN
  Administered 2018-09-08: 100 mL via INTRAVENOUS

## 2018-09-08 NOTE — ED Notes (Addendum)
Pt O2 87%, placed on 2L [place]

## 2018-09-08 NOTE — Discharge Instructions (Addendum)
We saw in the ER for weakness. Results in the ER are overall reassuring besides slightly elevated ammonia level. Please take lactulose as prescribed.  Please follow-up with your primary care doctor or your liver doctor within the next 1 week.  Please continue following up with your hematologist. Please return to the ER if your symptoms worsen; you have increased pain, fevers, chills, inability to keep any medications down, confusion. Otherwise see the outpatient doctor as requested.

## 2018-09-08 NOTE — ED Notes (Signed)
Spoke with lab, ammonia clicked off in error. Ammonia sent downstairs.

## 2018-09-08 NOTE — ED Notes (Signed)
Patient assisted to restroom, ambulates with steady gait

## 2018-09-08 NOTE — ED Notes (Signed)
Pt ambulated without any SHOB maintained sats at 97-99%

## 2018-09-08 NOTE — ED Notes (Signed)
Pt is c/o right shoulder pain, facial pain, feeling week and tired.
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
## 2018-09-08 NOTE — ED Notes (Signed)
Repeat EKG given to [facility]., for review.

## 2018-12-20 ENCOUNTER — Encounter (HOSPITAL_COMMUNITY): Payer: Self-pay | Admitting: *Deleted

## 2018-12-20 ENCOUNTER — Other Ambulatory Visit: Payer: Self-pay

## 2018-12-20 ENCOUNTER — Emergency Department (HOSPITAL_COMMUNITY)
Admission: EM | Admit: 2018-12-20 | Discharge: 2018-12-21 | Disposition: A | Discharge status: Home / Self Care | Payer: Self-pay | Attending: Emergency Medicine | Admitting: Emergency Medicine

## 2018-12-20 DIAGNOSIS — K7030 Alcoholic cirrhosis of liver without ascites: Secondary | ICD-10-CM | POA: Insufficient documentation

## 2018-12-20 DIAGNOSIS — Z79899 Other long term (current) drug therapy: Secondary | ICD-10-CM | POA: Insufficient documentation

## 2018-12-20 DIAGNOSIS — F1010 Alcohol abuse, uncomplicated: Secondary | ICD-10-CM | POA: Insufficient documentation

## 2018-12-20 DIAGNOSIS — I959 Hypotension, unspecified: Secondary | ICD-10-CM | POA: Insufficient documentation

## 2018-12-20 DIAGNOSIS — F17210 Nicotine dependence, cigarettes, uncomplicated: Secondary | ICD-10-CM | POA: Insufficient documentation

## 2018-12-20 NOTE — ED Triage Notes (Signed)
Pt requesting detox from ETOH and having a cough x 3 days.  Pt denies SI/HI.

## 2018-12-21 LAB — COMPREHENSIVE METABOLIC PANEL
ALBUMIN: 3.6 g/dL (ref 3.5–5.0)
ALT: 19 U/L (ref 0–44)
AST: 33 U/L (ref 15–41)
Alkaline Phosphatase: 76 U/L (ref 38–126)
Anion gap: 5 (ref 5–15)
BUN: 8 mg/dL (ref 6–20)
CALCIUM: 8.1 mg/dL — AB (ref 8.9–10.3)
CO2: 22 mmol/L (ref 22–32)
Chloride: 113 mmol/L — ABNORMAL HIGH (ref 98–111)
Creatinine, Ser: 0.53 mg/dL (ref 0.44–1.00)
GFR calc Af Amer: >60 mL/min (ref >60)
GFR calc non Af Amer: >60 mL/min (ref >60)
Glucose, Bld: 96 mg/dL (ref 70–99)
Potassium: 3.3 mmol/L — ABNORMAL LOW (ref 3.5–5.1)
Sodium: 140 mmol/L (ref 135–145)
Total Bilirubin: 1 mg/dL (ref 0.3–1.2)
Total Protein: 7.4 g/dL (ref 6.5–8.1)

## 2018-12-21 LAB — CBC WITH DIFFERENTIAL/PLATELET
Abs Immature Granulocytes: 0.01 10*3/uL (ref 0.00–0.07)
Basophils Absolute: 0 10*3/uL (ref 0.0–0.1)
Basophils Relative: 1 %
Eosinophils Absolute: 0 10*3/uL (ref 0.0–0.5)
Eosinophils Relative: 1 %
HCT: 31.7 % — ABNORMAL LOW (ref 36.0–46.0)
HEMOGLOBIN: 9.8 g/dL — AB (ref 12.0–15.0)
Immature Granulocytes: 0 %
LYMPHS PCT: 48 %
Lymphs Abs: 1.6 10*3/uL (ref 0.7–4.0)
MCH: 27.3 pg (ref 26.0–34.0)
MCHC: 30.9 g/dL (ref 30.0–36.0)
MCV: 88.3 fL (ref 80.0–100.0)
Monocytes Absolute: 0.3 10*3/uL (ref 0.1–1.0)
Monocytes Relative: 8 %
Neutro Abs: 1.4 10*3/uL — ABNORMAL LOW (ref 1.7–7.7)
Neutrophils Relative %: 42 %
Platelets: 67 10*3/uL — ABNORMAL LOW (ref 150–400)
RBC: 3.59 MIL/uL — ABNORMAL LOW (ref 3.87–5.11)
RDW: 16.7 % — ABNORMAL HIGH (ref 11.5–15.5)
WBC: 3.3 10*3/uL — ABNORMAL LOW (ref 4.0–10.5)
nRBC: 0 % (ref 0.0–0.2)

## 2018-12-21 LAB — PROTIME-INR
INR: 1.22
PROTHROMBIN TIME: 15.2 s (ref 11.4–15.2)

## 2018-12-21 LAB — LACTIC ACID, PLASMA: Lactic Acid, Venous: 1.6 mmol/L (ref 0.5–1.9)

## 2018-12-21 LAB — ACETAMINOPHEN LEVEL

## 2018-12-21 LAB — SALICYLATE LEVEL

## 2018-12-21 LAB — AMMONIA: Ammonia: 56 umol/L — ABNORMAL HIGH (ref 9–35)

## 2018-12-21 LAB — ETHANOL: Alcohol, Ethyl (B): 265 mg/dL — ABNORMAL HIGH (ref <10)

## 2018-12-21 LAB — APTT: aPTT: 37 seconds — ABNORMAL HIGH (ref 24–36)

## 2018-12-21 MED ORDER — SODIUM CHLORIDE 0.9 % IV BOLUS
1000.0000 mL | Freq: Once | INTRAVENOUS | Status: AC
  Administered 2018-12-21: 1000 mL via INTRAVENOUS

## 2018-12-21 MED ORDER — LORAZEPAM 1 MG PO TABS: 0.0000 mg | ORAL_TABLET | Freq: Four times a day (QID) | ORAL | Status: DC

## 2018-12-21 MED ORDER — VITAMIN B-1 100 MG PO TABS: 100.0000 mg | ORAL_TABLET | Freq: Every day | ORAL | Status: DC

## 2018-12-21 MED ORDER — CHLORDIAZEPOXIDE HCL 25 MG PO CAPS: ORAL_CAPSULE | ORAL | 0 refills | Status: DC

## 2018-12-21 MED ORDER — LORAZEPAM 1 MG PO TABS: 0.0000 mg | ORAL_TABLET | Freq: Two times a day (BID) | ORAL | Status: DC

## 2018-12-21 NOTE — Discharge Instructions (Addendum)
Take the medications as prescribed.  Please follow-up with your psychiatrist, your therapist, and please contact your sponsor at [facility].  Return to the emergency room if you feel like you need to be admitted.
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
## 2019-01-19 ENCOUNTER — Telehealth: Payer: Self-pay | Admitting: Hematology and Oncology

## 2019-01-19 NOTE — Telephone Encounter (Signed)
Per sch msg, patient wants to schedule appt. No answer. Left message to call back

## 2019-01-21 ENCOUNTER — Telehealth: Payer: Self-pay | Admitting: Hematology and Oncology

## 2019-01-21 ENCOUNTER — Telehealth: Payer: Self-pay

## 2019-01-21 NOTE — Telephone Encounter (Signed)
Patient called about scheduling an appt, transferred to nurse

## 2019-01-21 NOTE — Telephone Encounter (Signed)
Pt called to request follow up appointment with MD be moved up.  Pt reports decrease in appetite and would like labs sooner.    Pt denies any chest pain, shortness of breath, fever, diarrhea.  Pt reports making herself eat so she has not actually lost weight.  Pt sleeping well, mood cheerful on phone.    Nurse reschedule follow up, patient aware.  Nurse educated pt if affect or symptoms develop to contact office.  Voiced understanding.

## 2019-01-22 NOTE — Progress Notes (Signed)
Patient Care Team: Patient, No Pcp Per as PCP - General (General Practice) Leandrew Koyanagi, MD (Family Medicine)  DIAGNOSIS:    ICD-10-CM   1. Pancytopenia ([place]) D61.818     CHIEF COMPLIANT: Follow-up of pancytopenia due to alcoholism  INTERVAL HISTORY: Sara [name] is a 43 y.o. with above-mentioned history of alcoholic cirrhosis with pancytopenia. I last saw her 10 months ago. In the interim she presented to the ED 7 times, most often for SOB, cough, and issues related to her alcoholism. She presents to the clinic alone today. She reports a recent significant decrease in appetite. She has cut back her alcohol consumption and has decreased the amount of meat she eats. She is currently seeing a psychiatrist for her depression, which worsened after her sister's death last year. Her most recent labs show: WBC 3.5, Hg 11.2, platelets 76, ANC 1.5.  REVIEW OF SYSTEMS:   Constitutional: Denies fevers, chills or abnormal weight loss (+) decrease in appetite Eyes: Denies blurriness of vision Ears, nose, mouth, throat, and face: Denies mucositis or sore throat Respiratory: Denies cough, dyspnea or wheezes Cardiovascular: Denies palpitation, chest discomfort Gastrointestinal: Denies nausea, heartburn or change in bowel habits Skin: (+) red spots on chest Lymphatics: Denies new lymphadenopathy or easy bruising Neurological: Denies numbness, tingling or new weaknesses Behavioral/Psych: (+) depression Extremities: No lower extremity edema Breast: denies any pain or lumps or nodules in either breasts All other systems were reviewed with the patient and are negative.  I have reviewed the past medical history, past surgical history, social history and family history with the patient and they are unchanged from previous note.  ALLERGIES:  is allergic to morphine and related and nsaids.  MEDICATIONS:  Current Outpatient Medications  Medication Sig Dispense Refill  . chlordiazePOXIDE  (LIBRIUM) 25 MG capsule 39m PO TID x 1D, then 25-552mPO BID X 1D, then 25-5046mO QD X 1D 10 capsule 0  . escitalopram (LEXAPRO) 10 MG tablet Take 10 mg by mouth daily.    . lMarland Kitchenctulose (CHRONULAC) 10 GM/15ML solution Take 15 mLs (10 g total) by mouth 3 (three) times daily. (Patient not taking: Reported on 12/21/2018) 240 mL 0  . Multiple Vitamin (MULTIVITAMIN WITH MINERALS) TABS tablet Take 1 tablet by mouth daily.    . tMarland Kitcheniamine (VITAMIN B-1) 100 MG tablet Take 1 tablet (100 mg total) by mouth daily. (Patient not taking: Reported on 12/21/2018) 30 tablet 0  . topiramate (TOPAMAX) 25 MG capsule Take 25 mg by mouth 2 (two) times daily.     No current facility-administered medications for this visit.     PHYSICAL EXAMINATION: ECOG PERFORMANCE STATUS: 1 - Symptomatic but completely ambulatory  Vitals:   01/23/19 1332  BP: 98/64  Pulse: 65  Resp: 16  Temp: 98.4 F (36.9 C)  SpO2: 96%   Filed Weights   01/23/19 1332  Weight: 156 lb 11.2 oz (71.1 kg)    GENERAL: alert, no distress and comfortable SKIN: skin color, texture, turgor are normal, no significant lesions  EYES: normal, Conjunctiva are pink and non-injected, sclera clear OROPHARYNX: no exudate, no erythema and lips, buccal mucosa, and tongue normal  NECK: supple, thyroid normal size, non-tender, without nodularity LYMPH: no palpable lymphadenopathy in the cervical, axillary or inguinal LUNGS: clear to auscultation and percussion with normal breathing effort HEART: regular rate & rhythm and no murmurs and no lower extremity edema ABDOMEN: abdomen soft, non-tender and normal bowel sounds MUSCULOSKELETAL: no cyanosis of digits and no clubbing  NEURO: alert &  oriented x 3 with fluent speech, no focal motor/sensory deficits EXTREMITIES: No lower extremity edema  LABORATORY DATA:  I have reviewed the data as listed CMP Latest Ref Rng & Units 12/21/2018 09/07/2018 08/30/2018  Glucose 70 - 99 mg/dL 96 106(H) 103(H)  BUN 6 - 20  mg/dL _0 Creatinine 0.44 - 1.00 mg/dL 0.53 0.61 0.45  Sodium 135 - 145 mmol/L 140 140 147(H)  Potassium 3.5 - 5.1 mmol/L 3.3(L) 3.5 3.7  Chloride 98 - 111 mmol/L 113(H) 110 112(H)  CO2 22 - 32 mmol/L _1 Calcium 8.9 - 10.3 mg/dL 8.1(L) 8.4(L) 8.3(L)  Total Protein 6.5 - 8.1 g/dL 7.4 7.5 7.4  Total Bilirubin 0.3 - 1.2 mg/dL 1.0 0.9 0.8  Alkaline Phos 38 - 126 U/L 76 86 74  AST 15 - 41 U/L 33 70(H) 59(H)  ALT 0 - 44 U/L _2 Lab Results  Component Value Date   WBC 3.5 (L) 01/23/2019   HGB 11.2 (L) 01/23/2019   HCT 36.1 01/23/2019   MCV 88.0 01/23/2019   PLT 76 (L) 01/23/2019   NEUTROABS 1.5 (L) 01/23/2019    ASSESSMENT & PLAN:  Pancytopenia Pancytopenia multifactorial etiology Leukopenia: Primarily neutropenia  Workup: 1. CBC with differential09/25/2018: WBC 2.7, ANC 1.5, hemoglobin 10, platelets 69 2. W-62 and folic acid levels: Normal 3. ANA: Negative 4. Flow cytometry: No monoclonalB orT-cell aberrant populations identified 5.Iron studies iron saturation 6%, TIBC 510, ferritin 95. (Given IV iron October 2018)  Recommendation: Bone marrow biopsy on 09/26/2017: Slightly hypercellular(50-70%)bone marrow for age with trilineage hematopoiesis, mild plasmacytosis 4%  The pancytopenia is directly attributable to her alcoholism and cirrhosis of the liver.  Lab review:  Hemoglobin 11.2, white count 3.5, platelets 76 these numbers have significantly improved compared to last 4 months. I reiterated the importance of alcohol cessation. However the patient has still not quit drinking. Alcohol abuse and recent ED visits: Patient has had multiple challenges from drinking alcohol including suicidal ideation in the past.  If she has any bleeding problems in the treatment plan would primarily be transfusion of platelets and blood products and doing supportive care.   Return to clinic  on an as-needed basis.    Orders Placed This Encounter  Procedures  .  Comprehensive metabolic panel   The patient has a good understanding of the overall plan. she agrees with it. she will call with any problems that may develop before the next visit here.  Nicholas Lose, MD 01/23/2019  Julious Oka Dorshimer am acting as scribe for Dr. Nicholas Lose.  I have reviewed the above documentation for accuracy and completeness, and I agree with the above.

## 2019-01-23 ENCOUNTER — Inpatient Hospital Stay: Payer: Self-pay | Attending: Hematology and Oncology | Admitting: Hematology and Oncology

## 2019-01-23 ENCOUNTER — Inpatient Hospital Stay: Payer: Self-pay

## 2019-01-23 DIAGNOSIS — K7030 Alcoholic cirrhosis of liver without ascites: Secondary | ICD-10-CM

## 2019-01-23 DIAGNOSIS — D61818 Other pancytopenia: Secondary | ICD-10-CM | POA: Insufficient documentation

## 2019-01-23 DIAGNOSIS — D709 Neutropenia, unspecified: Secondary | ICD-10-CM | POA: Insufficient documentation

## 2019-01-23 DIAGNOSIS — F1020 Alcohol dependence, uncomplicated: Secondary | ICD-10-CM | POA: Insufficient documentation

## 2019-01-23 DIAGNOSIS — F329 Major depressive disorder, single episode, unspecified: Secondary | ICD-10-CM | POA: Insufficient documentation

## 2019-01-23 LAB — COMPREHENSIVE METABOLIC PANEL
ALK PHOS: 100 U/L (ref 38–126)
ALT: 21 U/L (ref 0–44)
AST: 44 U/L — ABNORMAL HIGH (ref 15–41)
Albumin: 4.1 g/dL (ref 3.5–5.0)
Anion gap: 8 (ref 5–15)
BUN: 5 mg/dL — ABNORMAL LOW (ref 6–20)
CO2: 23 mmol/L (ref 22–32)
Calcium: 8.4 mg/dL — ABNORMAL LOW (ref 8.9–10.3)
Chloride: 112 mmol/L — ABNORMAL HIGH (ref 98–111)
Creatinine, Ser: 0.62 mg/dL (ref 0.44–1.00)
GFR calc Af Amer: >60 mL/min (ref >60)
GFR calc non Af Amer: >60 mL/min (ref >60)
Glucose, Bld: 89 mg/dL (ref 70–99)
Potassium: 3.7 mmol/L (ref 3.5–5.1)
Sodium: 143 mmol/L (ref 135–145)
Total Bilirubin: 0.7 mg/dL (ref 0.3–1.2)
Total Protein: 8.1 g/dL (ref 6.5–8.1)

## 2019-01-23 LAB — CBC WITH DIFFERENTIAL (CANCER CENTER ONLY)
Abs Immature Granulocytes: 0.01 10*3/uL (ref 0.00–0.07)
Basophils Absolute: 0 10*3/uL (ref 0.0–0.1)
Basophils Relative: 1 %
EOS PCT: 1 %
Eosinophils Absolute: 0 10*3/uL (ref 0.0–0.5)
HCT: 36.1 % (ref 36.0–46.0)
HEMOGLOBIN: 11.2 g/dL — AB (ref 12.0–15.0)
Immature Granulocytes: 0 %
Lymphocytes Relative: 51 %
Lymphs Abs: 1.8 10*3/uL (ref 0.7–4.0)
MCH: 27.3 pg (ref 26.0–34.0)
MCHC: 31 g/dL (ref 30.0–36.0)
MCV: 88 fL (ref 80.0–100.0)
MONO ABS: 0.2 10*3/uL (ref 0.1–1.0)
MONOS PCT: 5 %
Neutro Abs: 1.5 10*3/uL — ABNORMAL LOW (ref 1.7–7.7)
Neutrophils Relative %: 42 %
Platelet Count: 76 10*3/uL — ABNORMAL LOW (ref 150–400)
RBC: 4.1 MIL/uL (ref 3.87–5.11)
RDW: 16.3 % — ABNORMAL HIGH (ref 11.5–15.5)
WBC Count: 3.5 10*3/uL — ABNORMAL LOW (ref 4.0–10.5)
nRBC: 0 % (ref 0.0–0.2)

## 2019-01-23 NOTE — Assessment & Plan Note (Signed)
Pancytopenia multifactorial etiology Leukopenia: Primarily neutropenia  Workup: 1. CBC with differential09/25/2018: WBC 2.7, ANC 1.5, hemoglobin 10, platelets 69 2. C-61 and folic acid levels: Normal 3. ANA: Negative 4. Flow cytometry: No monoclonalB orT-cell aberrant populations identified 5.Iron studies iron saturation 6%, TIBC 510, ferritin 95. (Given IV iron October 2018)  Recommendation: Bone marrow biopsy on 09/26/2017: Slightly hypercellular(50-70%)bone marrow for age with trilineage hematopoiesis, mild plasmacytosis 4%  The pancytopenia is directly attributable to her alcoholism and cirrhosis of the liver.  Lab review:   However the patient has still not quit drinking.  I spent a lot of time counseling her regarding alcohol cessation and participating in [employer].   Alcohol abuse and recent ED visits: Patient has had multiple challenges from drinking alcohol including suicidal ideation in the past.  If she has any bleeding problems in the treatment plan would primarily be transfusion of platelets and blood products and doing supportive care.   Return to clinic in 1 year for follow-up with labs

## 2019-03-10 ENCOUNTER — Emergency Department (HOSPITAL_COMMUNITY)
Admission: EM | Admit: 2019-03-10 | Discharge: 2019-03-10 | Disposition: A | Discharge status: Home / Self Care | Payer: Self-pay | Attending: Emergency Medicine | Admitting: Emergency Medicine

## 2019-03-10 ENCOUNTER — Other Ambulatory Visit: Payer: Self-pay

## 2019-03-10 ENCOUNTER — Emergency Department (HOSPITAL_COMMUNITY): Payer: Self-pay

## 2019-03-10 ENCOUNTER — Encounter (HOSPITAL_COMMUNITY): Payer: Self-pay

## 2019-03-10 DIAGNOSIS — F10120 Alcohol abuse with intoxication, uncomplicated: Secondary | ICD-10-CM | POA: Insufficient documentation

## 2019-03-10 DIAGNOSIS — Z862 Personal history of diseases of the blood and blood-forming organs and certain disorders involving the immune mechanism: Secondary | ICD-10-CM | POA: Insufficient documentation

## 2019-03-10 DIAGNOSIS — J069 Acute upper respiratory infection, unspecified: Secondary | ICD-10-CM | POA: Insufficient documentation

## 2019-03-10 DIAGNOSIS — F329 Major depressive disorder, single episode, unspecified: Secondary | ICD-10-CM | POA: Insufficient documentation

## 2019-03-10 DIAGNOSIS — Z9049 Acquired absence of other specified parts of digestive tract: Secondary | ICD-10-CM | POA: Insufficient documentation

## 2019-03-10 DIAGNOSIS — F17210 Nicotine dependence, cigarettes, uncomplicated: Secondary | ICD-10-CM | POA: Insufficient documentation

## 2019-03-10 DIAGNOSIS — Z79899 Other long term (current) drug therapy: Secondary | ICD-10-CM | POA: Insufficient documentation

## 2019-03-10 DIAGNOSIS — F10920 Alcohol use, unspecified with intoxication, uncomplicated: Secondary | ICD-10-CM

## 2019-03-10 DIAGNOSIS — F419 Anxiety disorder, unspecified: Secondary | ICD-10-CM | POA: Insufficient documentation

## 2019-03-10 LAB — GROUP A STREP BY PCR: Group A Strep by PCR: NOT DETECTED

## 2019-03-10 LAB — COMPREHENSIVE METABOLIC PANEL
ALT: 33 U/L (ref 0–44)
AST: 96 U/L — ABNORMAL HIGH (ref 15–41)
Albumin: 3.5 g/dL (ref 3.5–5.0)
Alkaline Phosphatase: 93 U/L (ref 38–126)
Anion gap: 11 (ref 5–15)
BUN: <5 mg/dL — ABNORMAL LOW (ref 6–20)
CO2: 21 mmol/L — ABNORMAL LOW (ref 22–32)
Calcium: 8.5 mg/dL — ABNORMAL LOW (ref 8.9–10.3)
Chloride: 110 mmol/L (ref 98–111)
Creatinine, Ser: 0.47 mg/dL (ref 0.44–1.00)
GFR calc Af Amer: >60 mL/min (ref >60)
GFR calc non Af Amer: >60 mL/min (ref >60)
Glucose, Bld: 102 mg/dL — ABNORMAL HIGH (ref 70–99)
Potassium: 4.5 mmol/L (ref 3.5–5.1)
Sodium: 142 mmol/L (ref 135–145)
Total Bilirubin: 0.8 mg/dL (ref 0.3–1.2)
Total Protein: 7.3 g/dL (ref 6.5–8.1)

## 2019-03-10 LAB — CBC WITH DIFFERENTIAL/PLATELET
Abs Immature Granulocytes: 0.01 10*3/uL (ref 0.00–0.07)
Basophils Absolute: 0 10*3/uL (ref 0.0–0.1)
Basophils Relative: 0 %
Eosinophils Absolute: 0.1 10*3/uL (ref 0.0–0.5)
Eosinophils Relative: 2 %
HCT: 33.9 % — ABNORMAL LOW (ref 36.0–46.0)
Hemoglobin: 10.4 g/dL — ABNORMAL LOW (ref 12.0–15.0)
Immature Granulocytes: 0 %
Lymphocytes Relative: 40 %
Lymphs Abs: 1.1 10*3/uL (ref 0.7–4.0)
MCH: 27.4 pg (ref 26.0–34.0)
MCHC: 30.7 g/dL (ref 30.0–36.0)
MCV: 89.2 fL (ref 80.0–100.0)
Monocytes Absolute: 0.2 10*3/uL (ref 0.1–1.0)
Monocytes Relative: 6 %
Neutro Abs: 1.4 10*3/uL — ABNORMAL LOW (ref 1.7–7.7)
Neutrophils Relative %: 52 %
Platelets: 60 10*3/uL — ABNORMAL LOW (ref 150–400)
RBC: 3.8 MIL/uL — ABNORMAL LOW (ref 3.87–5.11)
RDW: 18.7 % — ABNORMAL HIGH (ref 11.5–15.5)
WBC: 2.7 10*3/uL — ABNORMAL LOW (ref 4.0–10.5)
nRBC: 0 % (ref 0.0–0.2)

## 2019-03-10 LAB — ETHANOL: Alcohol, Ethyl (B): 340 mg/dL (ref <10)

## 2019-03-10 LAB — I-STAT BETA HCG BLOOD, ED (MC, WL, AP ONLY): I-stat hCG, quantitative: <5 m[IU]/mL (ref <5)

## 2019-03-10 LAB — PROTIME-INR
INR: 1.2 (ref 0.8–1.2)
Prothrombin Time: 15.1 seconds (ref 11.4–15.2)

## 2019-03-10 LAB — AMMONIA: Ammonia: 41 umol/L — ABNORMAL HIGH (ref 9–35)

## 2019-03-10 MED ORDER — SODIUM CHLORIDE 0.9 % IV BOLUS (SEPSIS)
500.0000 mL | Freq: Once | INTRAVENOUS | Status: AC
  Administered 2019-03-10: 500 mL via INTRAVENOUS

## 2019-03-10 MED ORDER — CHLORDIAZEPOXIDE HCL 25 MG PO CAPS: ORAL_CAPSULE | ORAL | 0 refills | Status: DC

## 2019-03-10 MED ORDER — SODIUM CHLORIDE 0.9 % IV SOLN
1000.0000 mL | INTRAVENOUS | Status: DC
  Administered 2019-03-10: 1000 mL via INTRAVENOUS
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
## 2019-03-10 NOTE — ED Triage Notes (Signed)
Pt reports worsening SOB, sore throat, and generalized weakness and not feeling well progressing over last couple of days.  Denies being around anyone known with Covid or sick. Afebrile in ER. Precautions ordered.

## 2019-03-10 NOTE — Discharge Instructions (Addendum)
Try taking over-the-counter antihistamines, try to quit drinking alcohol as we discussed, do not take the Librium medication if you continue to drink

## 2019-03-18 ENCOUNTER — Emergency Department (HOSPITAL_COMMUNITY): Payer: HRSA Program

## 2019-03-18 ENCOUNTER — Emergency Department (HOSPITAL_COMMUNITY)
Admission: EM | Admit: 2019-03-18 | Discharge: 2019-03-18 | Disposition: A | Discharge status: Home / Self Care | Payer: HRSA Program | Attending: Emergency Medicine | Admitting: Emergency Medicine

## 2019-03-18 ENCOUNTER — Other Ambulatory Visit: Payer: Self-pay

## 2019-03-18 ENCOUNTER — Encounter (HOSPITAL_COMMUNITY): Payer: Self-pay

## 2019-03-18 ENCOUNTER — Telehealth: Payer: Self-pay | Admitting: Family

## 2019-03-18 DIAGNOSIS — R059 Cough, unspecified: Secondary | ICD-10-CM

## 2019-03-18 DIAGNOSIS — R05 Cough: Secondary | ICD-10-CM

## 2019-03-18 DIAGNOSIS — D61818 Other pancytopenia: Secondary | ICD-10-CM

## 2019-03-18 DIAGNOSIS — Z20828 Contact with and (suspected) exposure to other viral communicable diseases: Secondary | ICD-10-CM | POA: Diagnosis not present

## 2019-03-18 DIAGNOSIS — Z79899 Other long term (current) drug therapy: Secondary | ICD-10-CM | POA: Diagnosis not present

## 2019-03-18 DIAGNOSIS — Z20822 Contact with and (suspected) exposure to covid-19: Secondary | ICD-10-CM

## 2019-03-18 DIAGNOSIS — J029 Acute pharyngitis, unspecified: Secondary | ICD-10-CM | POA: Insufficient documentation

## 2019-03-18 DIAGNOSIS — F17210 Nicotine dependence, cigarettes, uncomplicated: Secondary | ICD-10-CM | POA: Insufficient documentation

## 2019-03-18 DIAGNOSIS — R509 Fever, unspecified: Secondary | ICD-10-CM | POA: Diagnosis not present

## 2019-03-18 DIAGNOSIS — R6889 Other general symptoms and signs: Principal | ICD-10-CM

## 2019-03-18 LAB — CBC WITH DIFFERENTIAL/PLATELET
Abs Immature Granulocytes: 0.01 10*3/uL (ref 0.00–0.07)
Basophils Absolute: 0 10*3/uL (ref 0.0–0.1)
Basophils Relative: 1 %
Eosinophils Absolute: 0 10*3/uL (ref 0.0–0.5)
Eosinophils Relative: 1 %
HCT: 29.8 % — ABNORMAL LOW (ref 36.0–46.0)
Hemoglobin: 9.6 g/dL — ABNORMAL LOW (ref 12.0–15.0)
Immature Granulocytes: 1 %
Lymphocytes Relative: 30 %
Lymphs Abs: 0.4 10*3/uL — ABNORMAL LOW (ref 0.7–4.0)
MCH: 29.4 pg (ref 26.0–34.0)
MCHC: 32.2 g/dL (ref 30.0–36.0)
MCV: 91.1 fL (ref 80.0–100.0)
Monocytes Absolute: 0.3 10*3/uL (ref 0.1–1.0)
Monocytes Relative: 22 %
Neutro Abs: 0.7 10*3/uL — ABNORMAL LOW (ref 1.7–7.7)
Neutrophils Relative %: 45 %
Platelets: 35 10*3/uL — ABNORMAL LOW (ref 150–400)
RBC: 3.27 MIL/uL — ABNORMAL LOW (ref 3.87–5.11)
RDW: 19.4 % — ABNORMAL HIGH (ref 11.5–15.5)
WBC: 1.5 10*3/uL — ABNORMAL LOW (ref 4.0–10.5)
nRBC: 0 % (ref 0.0–0.2)

## 2019-03-18 LAB — COMPREHENSIVE METABOLIC PANEL
ALT: 30 U/L (ref 0–44)
AST: 70 U/L — ABNORMAL HIGH (ref 15–41)
Albumin: 3.7 g/dL (ref 3.5–5.0)
Alkaline Phosphatase: 93 U/L (ref 38–126)
Anion gap: 9 (ref 5–15)
BUN: 9 mg/dL (ref 6–20)
CO2: 18 mmol/L — ABNORMAL LOW (ref 22–32)
Calcium: 8.5 mg/dL — ABNORMAL LOW (ref 8.9–10.3)
Chloride: 104 mmol/L (ref 98–111)
Creatinine, Ser: 0.51 mg/dL (ref 0.44–1.00)
GFR calc Af Amer: >60 mL/min (ref >60)
GFR calc non Af Amer: >60 mL/min (ref >60)
Glucose, Bld: 102 mg/dL — ABNORMAL HIGH (ref 70–99)
Potassium: 3.1 mmol/L — ABNORMAL LOW (ref 3.5–5.1)
Sodium: 131 mmol/L — ABNORMAL LOW (ref 135–145)
Total Bilirubin: 1.9 mg/dL — ABNORMAL HIGH (ref 0.3–1.2)
Total Protein: 7.6 g/dL (ref 6.5–8.1)

## 2019-03-18 LAB — AMMONIA: Ammonia: 56 umol/L — ABNORMAL HIGH (ref 9–35)

## 2019-03-18 LAB — ETHANOL: Alcohol, Ethyl (B): <10 mg/dL (ref <10)

## 2019-03-18 MED ORDER — LIDOCAINE VISCOUS HCL 2 % MT SOLN: 15.0000 mL | OROMUCOSAL | 0 refills | Status: DC | PRN

## 2019-03-18 MED ORDER — SODIUM CHLORIDE 0.9 % IV BOLUS
1000.0000 mL | Freq: Once | INTRAVENOUS | Status: AC
  Administered 2019-03-18: 1000 mL via INTRAVENOUS

## 2019-03-18 MED ORDER — LIDOCAINE VISCOUS HCL 2 % MT SOLN
15.0000 mL | Freq: Once | OROMUCOSAL | Status: AC
  Administered 2019-03-18: 15 mL via OROMUCOSAL
  Filled 2019-03-18: qty 15

## 2019-03-18 NOTE — ED Provider Notes (Signed)
Assumed care from PA Gekas at shift change.  See prior notes for full H&P.  Briefly, [name] with history of alcohol abuse here for cough, fever, sore throat.  Seen here last week for same.  Vitals have been stable.  CXR was performed and is negative.  Plan:  Awaiting labs, given IVF.  Anticipate discharge.  Results for orders placed or performed during the hospital encounter of 03/18/19  Comprehensive metabolic panel  Result Value Ref Range   Sodium 131 (L) 135 - 145 mmol/L   Potassium 3.1 (L) 3.5 - 5.1 mmol/L   Chloride 104 98 - 111 mmol/L   CO2 18 (L) 22 - 32 mmol/L   Glucose, Bld 102 (H) 70 - 99 mg/dL   BUN 9 6 - 20 mg/dL   Creatinine, Ser 0.51 0.44 - 1.00 mg/dL   Calcium 8.5 (L) 8.9 - 10.3 mg/dL   Total Protein 7.6 6.5 - 8.1 g/dL   Albumin 3.7 3.5 - 5.0 g/dL   AST 70 (H) 15 - 41 U/L   ALT 30 0 - 44 U/L   Alkaline Phosphatase 93 38 - 126 U/L   Total Bilirubin 1.9 (H) 0.3 - 1.2 mg/dL   GFR calc non Af Amer >60 >60 mL/min   GFR calc Af Amer >60 >60 mL/min   Anion gap 9 5 - 15  CBC with Differential  Result Value Ref Range   WBC 1.5 (L) 4.0 - 10.5 K/uL   RBC 3.27 (L) 3.87 - 5.11 MIL/uL   Hemoglobin 9.6 (L) 12.0 - 15.0 g/dL   HCT 29.8 (L) 36.0 - 46.0 %   MCV 91.1 80.0 - 100.0 fL   MCH 29.4 26.0 - 34.0 pg   MCHC 32.2 30.0 - 36.0 g/dL   RDW 19.4 (H) 11.5 - 15.5 %   Platelets 35 (L) 150 - 400 K/uL   nRBC 0.0 0.0 - 0.2 %   Neutrophils Relative % 45 %   Neutro Abs 0.7 (L) 1.7 - 7.7 K/uL   Lymphocytes Relative 30 %   Lymphs Abs 0.4 (L) 0.7 - 4.0 K/uL   Monocytes Relative 22 %   Monocytes Absolute 0.3 0.1 - 1.0 K/uL   Eosinophils Relative 1 %   Eosinophils Absolute 0.0 0.0 - 0.5 K/uL   Basophils Relative 1 %   Basophils Absolute 0.0 0.0 - 0.1 K/uL   Smear Review POLYCHROMASIA PRESENT    Immature Granulocytes 1 %   Abs Immature Granulocytes 0.01 0.00 - 0.07 K/uL  Ammonia  Result Value Ref Range   Ammonia 56 (H) 9 - 35 umol/L  Ethanol  Result Value Ref Range   Alcohol,  Ethyl (B) <10 <10 mg/dL   Dg Chest Port 1 View  Result Date: 03/18/2019 CLINICAL DATA:  43 year old female with fever EXAM: PORTABLE CHEST 1 VIEW COMPARISON:  03/10/2019 FINDINGS: The heart size and mediastinal contours are within normal limits. Both lungs are clear. The visualized skeletal structures are unremarkable. IMPRESSION: Negative for acute cardiopulmonary disease Electronically Signed   By: Corrie Mckusick D.O.   On: 03/18/2019 21:49   Dg Chest Portable 1 View  Result Date: 03/10/2019 CLINICAL DATA:  Cough EXAM: PORTABLE CHEST 1 VIEW COMPARISON:  09/08/2018 FINDINGS: The heart size and mediastinal contours are within normal limits. Both lungs are clear. The visualized skeletal structures are unremarkable. IMPRESSION: No active disease. Electronically Signed   By: Kathreen Devoid   On: 03/10/2019 12:29     11:33 PM Patient's labs reviewed-- not much  different than last week and similar to prior values when compared.  Still has progressive pancytopenia for which she follows with heme/onc.  Notes reviewed from prior office visits--seems this is felt to be due to her continued EtOH abuse.  She has been resting comfortably here, sleeping when I enter room.  Vitals are stable.  I have reviewed labs and CXR with her.  She was made aware send out COVID testing was sent and should be notified of results in 5-7 days.  Feel she is stable for discharge home.  Continued symptomatic care, home quarantine per CDC recommendations which she was given a copy of.  Can follow-up with PCP.  Return here for any new/acute changes.  Sara [name] was evaluated in Emergency Department on 03/18/2019 for the symptoms described in the history of present illness. She was evaluated in the context of the global COVID-19 pandemic, which necessitated consideration that the patient might be at risk for infection with the SARS-CoV-2 virus that causes COVID-19. Institutional protocols and algorithms that pertain to the evaluation of  patients at risk for COVID-19 are in a state of rapid change based on information released by regulatory bodies including the CDC and federal and state organizations. These policies and algorithms were followed during the patient's care in the ED.    Sara Pickett, PA-C 03/19/19 0106    Valarie Merino, MD 03/28/19 [phone]
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
## 2019-03-18 NOTE — Discharge Instructions (Signed)
Take the prescribed medication as directed.  Make sure to drink lots of fluids to stay hydrated. Continue to self quarantine at home per guidelines below.  Your COVID test should come back in 5-7 days. Follow-up with your primary care doctor. Return to the ED for new or worsening symptoms.     Person Under Monitoring Name: Sara [name]  Location: [place] [address]   Infection Prevention Recommendations for Individuals Confirmed to have, or Being Evaluated for, 2019 Novel Coronavirus (COVID-19) Infection Who Receive Care at Home  Individuals who are confirmed to have, or are being evaluated for, COVID-19 should follow the prevention steps below until a healthcare provider or local or state health department says they can return to normal activities.  Stay home except to get medical care You should restrict activities outside your home, except for getting medical care. Do not go to work, school, or public areas, and do not use public transportation or taxis.  Call ahead before visiting your doctor Before your medical appointment, call the healthcare provider and tell them that you have, or are being evaluated for, COVID-19 infection. This will help the healthcare providers office take steps to keep other people from getting infected. Ask your healthcare provider to call the local or state health department.  Monitor your symptoms Seek prompt medical attention if your illness is worsening (e.g., difficulty breathing). Before going to your medical appointment, call the healthcare provider and tell them that you have, or are being evaluated for, COVID-19 infection. Ask your healthcare provider to call the local or state health department.  Wear a facemask You should wear a facemask that covers your nose and mouth when you are in the same room with other people and when you visit a healthcare provider. People who live with or visit you should also wear a  facemask while they are in the same room with you.  Separate yourself from other people in your home As much as possible, you should stay in a different room from other people in your home. Also, you should use a separate bathroom, if available.  Avoid sharing household items You should not share dishes, drinking glasses, cups, eating utensils, towels, bedding, or other items with other people in your home. After using these items, you should wash them thoroughly with soap and water.  Cover your coughs and sneezes Cover your mouth and nose with a tissue when you cough or sneeze, or you can cough or sneeze into your sleeve. Throw used tissues in a lined trash can, and immediately wash your hands with soap and water for at least 20 seconds or use an alcohol-based hand rub.  Wash your [facility] your hands often and thoroughly with soap and water for at least 20 seconds. You can use an alcohol-based hand sanitizer if soap and water are not available and if your hands are not visibly dirty. Avoid touching your eyes, nose, and mouth with unwashed hands.   Prevention Steps for Caregivers and Household Members of Individuals Confirmed to have, or Being Evaluated for, COVID-19 Infection Being Cared for in the Home  If you live with, or provide care at home for, a person confirmed to have, or being evaluated for, COVID-19 infection please follow these guidelines to prevent infection:  Follow healthcare providers instructions Make sure that you understand and can help the patient follow any healthcare provider instructions for all care.  Provide for the patients basic needs You should help the patient with  basic needs in the home and provide support for getting groceries, prescriptions, and other personal needs.  Monitor the patients symptoms If they are getting sicker, call his or her medical provider and tell them that the patient has, or is being evaluated for, COVID-19 infection.  This will help the healthcare providers office take steps to keep other people from getting infected. Ask the healthcare provider to call the local or state health department.  Limit the number of people who have contact with the patient If possible, have only one caregiver for the patient. Other household members should stay in another home or place of residence. If this is not possible, they should stay in another room, or be separated from the patient as much as possible. Use a separate bathroom, if available. Restrict visitors who do not have an essential need to be in the home.  Keep older adults, very young children, and other sick people away from the patient Keep older adults, very young children, and those who have compromised immune systems or chronic health conditions away from the patient. This includes people with chronic heart, lung, or kidney conditions, diabetes, and cancer.  Ensure good ventilation Make sure that shared spaces in the home have good air flow, such as from an air conditioner or an opened window, weather permitting.  Wash your hands often Wash your hands often and thoroughly with soap and water for at least 20 seconds. You can use an alcohol based hand sanitizer if soap and water are not available and if your hands are not visibly dirty. Avoid touching your eyes, nose, and mouth with unwashed hands. Use disposable paper towels to dry your hands. If not available, use dedicated cloth towels and replace them when they become wet.  Wear a facemask and gloves Wear a disposable facemask at all times in the room and gloves when you touch or have contact with the patients blood, body fluids, and/or secretions or excretions, such as sweat, saliva, sputum, nasal mucus, vomit, urine, or feces.  Ensure the mask fits over your nose and mouth tightly, and do not touch it during use. Throw out disposable facemasks and gloves after using them. Do not reuse. Wash your hands  immediately after removing your facemask and gloves. If your personal clothing becomes contaminated, carefully remove clothing and launder. Wash your hands after handling contaminated clothing. Place all used disposable facemasks, gloves, and other waste in a lined container before disposing them with other household waste. Remove gloves and wash your hands immediately after handling these items.  Do not share dishes, glasses, or other household items with the patient Avoid sharing household items. You should not share dishes, drinking glasses, cups, eating utensils, towels, bedding, or other items with a patient who is confirmed to have, or being evaluated for, COVID-19 infection. After the person uses these items, you should wash them thoroughly with soap and water.  Wash laundry thoroughly Immediately remove and wash clothes or bedding that have blood, body fluids, and/or secretions or excretions, such as sweat, saliva, sputum, nasal mucus, vomit, urine, or feces, on them. Wear gloves when handling laundry from the patient. Read and follow directions on labels of laundry or clothing items and detergent. In general, wash and dry with the warmest temperatures recommended on the label.  Clean all areas the individual has used often Clean all touchable surfaces, such as counters, tabletops, doorknobs, bathroom fixtures, toilets, phones, keyboards, tablets, and bedside tables, every day. Also, clean any surfaces that may have  blood, body fluids, and/or secretions or excretions on them. Wear gloves when cleaning surfaces the patient has come in contact with. Use a diluted bleach solution (e.g., dilute bleach with 1 part bleach and 10 parts water) or a household disinfectant with a label that says EPA-registered for coronaviruses. To make a bleach solution at home, add 1 tablespoon of bleach to 1 quart (4 cups) of water. For a larger supply, add  cup of bleach to 1 gallon (16 cups) of water. Read  labels of cleaning products and follow recommendations provided on product labels. Labels contain instructions for safe and effective use of the cleaning product including precautions you should take when applying the product, such as wearing gloves or eye protection and making sure you have good ventilation during use of the product. Remove gloves and wash hands immediately after cleaning.  Monitor yourself for signs and symptoms of illness Caregivers and household members are considered close contacts, should monitor their health, and will be asked to limit movement outside of the home to the extent possible. Follow the monitoring steps for close contacts listed on the symptom monitoring form.   ? If you have additional questions, contact your local health department or call the epidemiologist on call at [phone] (available 24/7). ? This guidance is subject to change. For the most up-to-date guidance from [facility], please refer to their website: [email or web address]

## 2019-03-18 NOTE — Progress Notes (Signed)
Based on what you shared with me, I feel your condition warrants further evaluation and I recommend that you be seen for a face to face office visit.  Given your symptoms of blurred vision and mental status change you need to be evaluated face to face.    NOTE: If you entered your credit card information for this eVisit, you will not be charged. You may see a "hold" on your card for the $35 but that hold will drop off and you will not have a charge processed.  If you are having a true medical emergency please call 911.  If you need an urgent face to face visit, [facility] has four urgent care centers for your convenience.    PLEASE NOTE: THE [facility] LOCATIONS AND URGENT CARE CLINICS DO NOT HAVE THE TESTING FOR CORONAVIRUS COVID19 AVAILABLE.  IF YOU FEEL YOU NEED THIS TEST YOU MUST GO TO A TRIAGE LOCATION AT [facility]   [email or web address] to reserve your spot online an avoid wait times  [facility] [address] [place], [place] [address] Modified hours of operation: Monday-Friday, 10 AM to 6 PM  Saturday & Sunday 10 AM to 4 PM *Across the street from [facility] (New Address!) [address] [place], [place] [address] *Just off [address], across the road from [place] hours of operation: Monday-Friday, 10 AM to 5 PM  Closed Saturday & Sunday   The following sites will take your insurance:  . [facility] [place] a Provider at this Location  [address] [place], [place] [address] . 10 am to 8 pm Monday-Friday . 12 pm to 8 pm Saturday-Sunday   . [facility] at [place] a Provider at this Location  [place], [place], [place] [address] . 8 am to 8 pm Monday-Friday . 9 am to 6 pm Saturday . 11 am to 6 pm Sunday    . [facility] at [place] Get Driving Directions  [address] [place], [place] [address] . 8 am to 8 pm Monday-Friday . 8 am to 4 pm Saturday-Sunday   Your e-visit answers were reviewed by a board certified advanced clinical practitioner to complete your personal care plan.  Thank you for using e-Visits.

## 2019-03-18 NOTE — ED Triage Notes (Signed)
Pt is alert and oriented x 4 and is verbally responsive. Pt reports sore throat, fever, and dry cough x 2 weeks. Denies n/v/d or known exposure to covid-19. [facility] for similar symptoms. Strep test negative per pt. Pt denies taking any medications.

## 2019-03-20 LAB — NOVEL CORONAVIRUS, NAA (HOSP ORDER, SEND-OUT TO REF LAB; TAT 18-24 HRS): SARS-CoV-2, NAA: NOT DETECTED

## 2019-04-01 ENCOUNTER — Emergency Department (HOSPITAL_COMMUNITY): Payer: Self-pay

## 2019-04-01 ENCOUNTER — Encounter (HOSPITAL_COMMUNITY): Payer: Self-pay | Admitting: Emergency Medicine

## 2019-04-01 ENCOUNTER — Observation Stay (HOSPITAL_COMMUNITY)
Admission: EM | Admit: 2019-04-01 | Discharge: 2019-04-03 | Disposition: A | Discharge status: Home / Self Care | Payer: Self-pay | Attending: Internal Medicine | Admitting: Internal Medicine

## 2019-04-01 DIAGNOSIS — I8510 Secondary esophageal varices without bleeding: Secondary | ICD-10-CM | POA: Insufficient documentation

## 2019-04-01 DIAGNOSIS — K219 Gastro-esophageal reflux disease without esophagitis: Secondary | ICD-10-CM | POA: Insufficient documentation

## 2019-04-01 DIAGNOSIS — F10229 Alcohol dependence with intoxication, unspecified: Principal | ICD-10-CM | POA: Insufficient documentation

## 2019-04-01 DIAGNOSIS — W19XXXA Unspecified fall, initial encounter: Secondary | ICD-10-CM | POA: Insufficient documentation

## 2019-04-01 DIAGNOSIS — K766 Portal hypertension: Secondary | ICD-10-CM | POA: Insufficient documentation

## 2019-04-01 DIAGNOSIS — S301XXA Contusion of abdominal wall, initial encounter: Secondary | ICD-10-CM | POA: Diagnosis present

## 2019-04-01 DIAGNOSIS — I959 Hypotension, unspecified: Secondary | ICD-10-CM | POA: Insufficient documentation

## 2019-04-01 DIAGNOSIS — Z79899 Other long term (current) drug therapy: Secondary | ICD-10-CM | POA: Insufficient documentation

## 2019-04-01 DIAGNOSIS — F1010 Alcohol abuse, uncomplicated: Secondary | ICD-10-CM | POA: Diagnosis present

## 2019-04-01 DIAGNOSIS — F32A Depression, unspecified: Secondary | ICD-10-CM | POA: Diagnosis present

## 2019-04-01 DIAGNOSIS — K7030 Alcoholic cirrhosis of liver without ascites: Secondary | ICD-10-CM | POA: Insufficient documentation

## 2019-04-01 DIAGNOSIS — D61818 Other pancytopenia: Secondary | ICD-10-CM | POA: Insufficient documentation

## 2019-04-01 DIAGNOSIS — F17210 Nicotine dependence, cigarettes, uncomplicated: Secondary | ICD-10-CM | POA: Insufficient documentation

## 2019-04-01 DIAGNOSIS — F329 Major depressive disorder, single episode, unspecified: Secondary | ICD-10-CM | POA: Insufficient documentation

## 2019-04-01 DIAGNOSIS — Z1159 Encounter for screening for other viral diseases: Secondary | ICD-10-CM | POA: Insufficient documentation

## 2019-04-01 DIAGNOSIS — F419 Anxiety disorder, unspecified: Secondary | ICD-10-CM | POA: Insufficient documentation

## 2019-04-01 DIAGNOSIS — F411 Generalized anxiety disorder: Secondary | ICD-10-CM

## 2019-04-01 DIAGNOSIS — E876 Hypokalemia: Secondary | ICD-10-CM | POA: Insufficient documentation

## 2019-04-01 DIAGNOSIS — E162 Hypoglycemia, unspecified: Secondary | ICD-10-CM | POA: Insufficient documentation

## 2019-04-01 DIAGNOSIS — K7689 Other specified diseases of liver: Secondary | ICD-10-CM | POA: Diagnosis present

## 2019-04-01 DIAGNOSIS — F1020 Alcohol dependence, uncomplicated: Secondary | ICD-10-CM

## 2019-04-01 DIAGNOSIS — Z8249 Family history of ischemic heart disease and other diseases of the circulatory system: Secondary | ICD-10-CM | POA: Insufficient documentation

## 2019-04-01 LAB — CBC WITH DIFFERENTIAL/PLATELET
Abs Immature Granulocytes: 0 10*3/uL (ref 0.00–0.07)
Basophils Absolute: 0 10*3/uL (ref 0.0–0.1)
Basophils Relative: 1 %
Eosinophils Absolute: 0 10*3/uL (ref 0.0–0.5)
Eosinophils Relative: 1 %
HCT: 31.3 % — ABNORMAL LOW (ref 36.0–46.0)
Hemoglobin: 9.5 g/dL — ABNORMAL LOW (ref 12.0–15.0)
Immature Granulocytes: 0 %
Lymphocytes Relative: 55 %
Lymphs Abs: 1.2 10*3/uL (ref 0.7–4.0)
MCH: 26.9 pg (ref 26.0–34.0)
MCHC: 30.4 g/dL (ref 30.0–36.0)
MCV: 88.7 fL (ref 80.0–100.0)
Monocytes Absolute: 0.2 10*3/uL (ref 0.1–1.0)
Monocytes Relative: 7 %
Neutro Abs: 0.8 10*3/uL — ABNORMAL LOW (ref 1.7–7.7)
Neutrophils Relative %: 36 %
Platelets: 93 10*3/uL — ABNORMAL LOW (ref 150–400)
RBC: 3.53 MIL/uL — ABNORMAL LOW (ref 3.87–5.11)
RDW: 17.8 % — ABNORMAL HIGH (ref 11.5–15.5)
WBC: 2.3 10*3/uL — ABNORMAL LOW (ref 4.0–10.5)
nRBC: 0 % (ref 0.0–0.2)

## 2019-04-01 LAB — BPAM RBC
Blood Product Expiration Date: 202005142359
Blood Product Expiration Date: 202005142359
ISSUE DATE / TIME: 202005111759
ISSUE DATE / TIME: 202005111759
Unit Type and Rh: 9500
Unit Type and Rh: 9500

## 2019-04-01 LAB — URINALYSIS, ROUTINE W REFLEX MICROSCOPIC
Bilirubin Urine: NEGATIVE
Glucose, UA: NEGATIVE mg/dL
Ketones, ur: NEGATIVE mg/dL
Leukocytes,Ua: NEGATIVE
Nitrite: NEGATIVE
Protein, ur: NEGATIVE mg/dL
Specific Gravity, Urine: 1.002 — ABNORMAL LOW (ref 1.005–1.030)
pH: 6 (ref 5.0–8.0)

## 2019-04-01 LAB — COMPREHENSIVE METABOLIC PANEL
ALT: 27 U/L (ref 0–44)
AST: 55 U/L — ABNORMAL HIGH (ref 15–41)
Albumin: 3.2 g/dL — ABNORMAL LOW (ref 3.5–5.0)
Alkaline Phosphatase: 82 U/L (ref 38–126)
Anion gap: 10 (ref 5–15)
BUN: 5 mg/dL — ABNORMAL LOW (ref 6–20)
CO2: 19 mmol/L — ABNORMAL LOW (ref 22–32)
Calcium: 8.1 mg/dL — ABNORMAL LOW (ref 8.9–10.3)
Chloride: 115 mmol/L — ABNORMAL HIGH (ref 98–111)
Creatinine, Ser: 0.53 mg/dL (ref 0.44–1.00)
GFR calc Af Amer: >60 mL/min (ref >60)
GFR calc non Af Amer: >60 mL/min (ref >60)
Glucose, Bld: 90 mg/dL (ref 70–99)
Potassium: 3.4 mmol/L — ABNORMAL LOW (ref 3.5–5.1)
Sodium: 144 mmol/L (ref 135–145)
Total Bilirubin: 0.6 mg/dL (ref 0.3–1.2)
Total Protein: 6.7 g/dL (ref 6.5–8.1)

## 2019-04-01 LAB — TYPE AND SCREEN
ABO/RH(D): A POS
Antibody Screen: NEGATIVE
Unit division: 0
Unit division: 0

## 2019-04-01 LAB — GLUCOSE, CAPILLARY: Glucose-Capillary: 80 mg/dL (ref 70–99)

## 2019-04-01 LAB — LACTIC ACID, PLASMA: Lactic Acid, Venous: 1.7 mmol/L (ref 0.5–1.9)

## 2019-04-01 LAB — RAPID URINE DRUG SCREEN, HOSP PERFORMED
Amphetamines: NOT DETECTED
Barbiturates: NOT DETECTED
Benzodiazepines: POSITIVE — AB
Cocaine: NOT DETECTED
Opiates: NOT DETECTED
Tetrahydrocannabinol: NOT DETECTED

## 2019-04-01 LAB — I-STAT BETA HCG BLOOD, ED (MC, WL, AP ONLY): I-stat hCG, quantitative: <5 m[IU]/mL (ref <5)

## 2019-04-01 LAB — ETHANOL: Alcohol, Ethyl (B): 337 mg/dL (ref <10)

## 2019-04-01 LAB — PROTIME-INR
INR: 1.2 (ref 0.8–1.2)
Prothrombin Time: 15.4 seconds — ABNORMAL HIGH (ref 11.4–15.2)

## 2019-04-01 LAB — SALICYLATE LEVEL: Salicylate Lvl: <7 mg/dL (ref 2.8–30.0)

## 2019-04-01 LAB — ACETAMINOPHEN LEVEL: Acetaminophen (Tylenol), Serum: <10 ug/mL — ABNORMAL LOW (ref 10–30)

## 2019-04-01 LAB — CBG MONITORING, ED: Glucose-Capillary: 68 mg/dL — ABNORMAL LOW (ref 70–99)

## 2019-04-01 LAB — ABO/RH: ABO/RH(D): A POS

## 2019-04-01 LAB — SARS CORONAVIRUS 2 BY RT PCR (HOSPITAL ORDER, PERFORMED IN ~~LOC~~ HOSPITAL LAB): SARS Coronavirus 2: NEGATIVE

## 2019-04-01 LAB — MAGNESIUM: Magnesium: 2 mg/dL (ref 1.7–2.4)

## 2019-04-01 MED ORDER — DEXTROSE 50 % IV SOLN
1.0000 | Freq: Once | INTRAVENOUS | Status: AC
  Administered 2019-04-01: 19:00:00 50 mL via INTRAVENOUS
  Filled 2019-04-01: qty 50

## 2019-04-01 MED ORDER — LORAZEPAM 2 MG/ML IJ SOLN: 2.0000 mg | INTRAMUSCULAR | Status: DC | PRN

## 2019-04-01 MED ORDER — LACTATED RINGERS IV SOLN
INTRAVENOUS | Status: DC
  Administered 2019-04-02 (×3): via INTRAVENOUS

## 2019-04-01 MED ORDER — LACTATED RINGERS IV SOLN: INTRAVENOUS | Status: DC

## 2019-04-01 MED ORDER — LACTULOSE 10 GM/15ML PO SOLN
10.0000 g | Freq: Three times a day (TID) | ORAL | Status: DC
  Administered 2019-04-02 – 2019-04-03 (×5): 10 g via ORAL
  Filled 2019-04-01 (×5): qty 15

## 2019-04-01 MED ORDER — LORAZEPAM 2 MG/ML IJ SOLN: 0.0000 mg | Freq: Two times a day (BID) | INTRAMUSCULAR | Status: DC

## 2019-04-01 MED ORDER — IOHEXOL 300 MG/ML  SOLN
100.0000 mL | Freq: Once | INTRAMUSCULAR | Status: AC | PRN
  Administered 2019-04-01: 100 mL via INTRAVENOUS

## 2019-04-01 MED ORDER — POTASSIUM CHLORIDE CRYS ER 20 MEQ PO TBCR
40.0000 meq | EXTENDED_RELEASE_TABLET | Freq: Once | ORAL | Status: AC
  Administered 2019-04-01: 40 meq via ORAL
  Filled 2019-04-01: qty 2

## 2019-04-01 MED ORDER — DEXTROSE 50 % IV SOLN: 50.0000 mL | INTRAVENOUS | Status: DC | PRN

## 2019-04-01 MED ORDER — PANTOPRAZOLE SODIUM 40 MG PO TBEC
40.0000 mg | DELAYED_RELEASE_TABLET | Freq: Two times a day (BID) | ORAL | Status: DC
  Administered 2019-04-01 – 2019-04-03 (×4): 40 mg via ORAL
  Filled 2019-04-01 (×4): qty 1

## 2019-04-01 MED ORDER — NOREPINEPHRINE 4 MG/250ML-% IV SOLN: 0.0000 ug/min | INTRAVENOUS | Status: DC

## 2019-04-01 MED ORDER — LORAZEPAM 2 MG/ML IJ SOLN
0.0000 mg | Freq: Four times a day (QID) | INTRAMUSCULAR | Status: DC
  Administered 2019-04-02: 1 mg via INTRAVENOUS
  Administered 2019-04-02: 2 mg via INTRAVENOUS
  Filled 2019-04-01 (×3): qty 1

## 2019-04-01 MED ORDER — ADULT MULTIVITAMIN W/MINERALS CH
1.0000 | ORAL_TABLET | Freq: Every day | ORAL | Status: DC
  Administered 2019-04-01 – 2019-04-03 (×3): 1 via ORAL
  Filled 2019-04-01 (×3): qty 1

## 2019-04-01 MED ORDER — ACETAMINOPHEN 650 MG RE SUPP: 650.0000 mg | Freq: Four times a day (QID) | RECTAL | Status: DC | PRN

## 2019-04-01 MED ORDER — FOLIC ACID 1 MG PO TABS
1.0000 mg | ORAL_TABLET | Freq: Every day | ORAL | Status: DC
  Administered 2019-04-01 – 2019-04-03 (×3): 1 mg via ORAL
  Filled 2019-04-01 (×3): qty 1

## 2019-04-01 MED ORDER — ESCITALOPRAM OXALATE 10 MG PO TABS: 10.0000 mg | ORAL_TABLET | Freq: Every day | ORAL | Status: DC

## 2019-04-01 MED ORDER — ACETAMINOPHEN 325 MG PO TABS
650.0000 mg | ORAL_TABLET | Freq: Four times a day (QID) | ORAL | Status: DC | PRN
  Administered 2019-04-02: 650 mg via ORAL
  Filled 2019-04-01: qty 2

## 2019-04-01 MED ORDER — SODIUM CHLORIDE 0.9 % IV BOLUS
1000.0000 mL | Freq: Once | INTRAVENOUS | Status: AC
  Administered 2019-04-01: 1000 mL via INTRAVENOUS

## 2019-04-01 MED ORDER — ONDANSETRON HCL 4 MG/2ML IJ SOLN: 4.0000 mg | Freq: Four times a day (QID) | INTRAMUSCULAR | Status: DC | PRN

## 2019-04-01 MED ORDER — TOPIRAMATE 25 MG PO TABS: 25.0000 mg | ORAL_TABLET | Freq: Two times a day (BID) | ORAL | Status: DC

## 2019-04-01 MED ORDER — TOPIRAMATE 25 MG PO TABS
25.0000 mg | ORAL_TABLET | Freq: Two times a day (BID) | ORAL | Status: DC
  Administered 2019-04-02 – 2019-04-03 (×3): 25 mg via ORAL
  Filled 2019-04-01 (×5): qty 1

## 2019-04-01 MED ORDER — LORAZEPAM 2 MG/ML IJ SOLN: 1.0000 mg | Freq: Four times a day (QID) | INTRAMUSCULAR | Status: DC | PRN

## 2019-04-01 MED ORDER — NICOTINE 21 MG/24HR TD PT24
21.0000 mg | MEDICATED_PATCH | Freq: Every day | TRANSDERMAL | Status: DC
  Administered 2019-04-02 – 2019-04-03 (×2): 21 mg via TRANSDERMAL
  Filled 2019-04-01 (×2): qty 1

## 2019-04-01 MED ORDER — ESCITALOPRAM OXALATE 10 MG PO TABS
10.0000 mg | ORAL_TABLET | ORAL | Status: DC
  Administered 2019-04-02 – 2019-04-03 (×2): 10 mg via ORAL
  Filled 2019-04-01 (×2): qty 1

## 2019-04-01 MED ORDER — LORAZEPAM 1 MG PO TABS
1.0000 mg | ORAL_TABLET | Freq: Four times a day (QID) | ORAL | Status: DC | PRN
  Administered 2019-04-01 – 2019-04-03 (×2): 1 mg via ORAL
  Filled 2019-04-01 (×2): qty 1

## 2019-04-01 MED ORDER — LACTATED RINGERS IV BOLUS
1000.0000 mL | Freq: Once | INTRAVENOUS | Status: AC
  Administered 2019-04-01: 1000 mL via INTRAVENOUS

## 2019-04-01 MED ORDER — ONDANSETRON HCL 4 MG PO TABS: 4.0000 mg | ORAL_TABLET | Freq: Four times a day (QID) | ORAL | Status: DC | PRN

## 2019-04-01 MED ORDER — VITAMIN B-1 100 MG PO TABS
100.0000 mg | ORAL_TABLET | Freq: Every day | ORAL | Status: DC
  Administered 2019-04-01 – 2019-04-03 (×3): 100 mg via ORAL
  Filled 2019-04-01 (×3): qty 1

## 2019-04-01 NOTE — ED Provider Notes (Signed)
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 injection 0-4 mg (has no administration in time range)    Followed by  LORazepam (ATIVAN) injection 0-4 mg (has  no administration in time range)  dextrose 50 % solution 50 mL (has no administration in time range)  lactulose (CHRONULAC) 10 GM/15ML solution 10 g (has no administration in time range)  escitalopram (LEXAPRO) tablet 10 mg (has no administration in time range)  pantoprazole (PROTONIX) EC tablet 40 mg (has no administration in time range)  ondansetron (ZOFRAN) tablet 4 mg (has no administration in time range)    Or  ondansetron (ZOFRAN) injection 4 mg (has no administration in time range)  acetaminophen (TYLENOL) tablet 650 mg (has no administration in time range)    Or  acetaminophen (TYLENOL) suppository 650 mg (has no administration in time range)  sodium chloride 0.9 % bolus 1,000 mL (0 mLs Intravenous Stopped 04/01/19 2007)  dextrose 50 % solution 50  mL (50 mLs Intravenous Given 04/01/19 1920)  sodium chloride 0.9 % bolus 1,000 mL (0 mLs Intravenous Stopped 04/01/19 1924)  iohexol (OMNIPAQUE) 300 MG/ML solution 100 mL (100 mLs Intravenous Contrast Given 04/01/19 1900)  lactated ringers bolus 1,000 mL (0 mLs Intravenous Stopped 04/01/19 2215)     Initial Impression / Assessment and Plan / ED Course  I have reviewed the triage vital signs and the nursing notes.  Pertinent labs & imaging results that were available during my care of the patient were reviewed by me and considered in my medical decision making (see chart for details).  Clinical Course as of Mar 31 2248  Mon Apr 01, 2019  7564 Previously 35 on April 27 and 60 on April 19.  Platelets(!): 93 [SJ]  1842 Spoke with Dr. Concepcion Living, critical care. States they will await the CT results and then come see the patient.   [SJ]  2108 Spoke with Dr. Blaine Hamper, hospitalist.  Agrees to admit the patient.   [SJ]  2130 Spoke with Dr. Rosendo Gros, Trauma. Reviewed imaging. Trauma will sign off from this patient.   [SJ]    Clinical Course User Index [SJ] Lorayne Bender, PA-C       Patient arrived via EMS following a fall.  She appeared to be clinically intoxicated at presentation.  She also was noted to be hypotensive.  Since she was hypotensive following a trauma, a Level 1 trauma alert was activated. Patient was then moved to a Trauma bay.  Possible positive fast via bedside ultrasound in the right upper quadrant and left upper quadrant.  However, she was nontender in these areas.  There was no noted distention or ecchymosis.  CT of the abdomen with hematoma to the right flank without evidence of active extravasation.  Imaging studies otherwise without acute abnormality.  Suspect decompensated liver cirrhosis at baseline.  Spider angiomas on chest and face.  Instances of hypotension in her chart.  Dry on physical exam.  Suspect dehydration as a contributing factor to patient's hypotension.  Critical  care service originally consulted due to patient's persistent hypotension and suspected need for vasopressors.  Hypotension ended up improving with fluids.  Critical care consulted on the patient.  Advised hospitalist can admit.   Findings and plan of care discussed with Duffy Bruce, MD. Dr. Ellender Hose personally evaluated and examined this patient.  Vitals:   04/01/19 1740 04/01/19 1746 04/01/19 1751 04/01/19 1801  BP: (!) 86/59 (!) 87/64 (!) 82/62 (!) 70/50  Pulse: 66 69 92 66  Resp: 19 (!) 9 (!) 41 15  Temp:      TempSrc:      SpO2: 98% 97% 99% 96%   Vitals:   04/01/19 1820 04/01/19 1902  04/01/19 1930 04/01/19 2008  BP: (!) 92/49 120/81 103/71   Pulse: 69 78 71   Resp: (!) 21  18   Temp:      TempSrc:      SpO2: 100% 97% 100%   Weight:    68 kg     Final Clinical Impressions(s) / ED Diagnoses   Final diagnoses:  Fall, initial encounter  ETOH abuse    ED Discharge Orders    None       Layla Maw 04/01/19 2255    Duffy Bruce, MD 04/02/19 1019

## 2019-04-01 NOTE — ED Triage Notes (Signed)
Pt arrives by [facility] with complaints of fall down approx 16 stairs. When EMS arrived patient ran upstairs and took 2 25mg  of Seroquel. Patient also has heavy ETOH on board. Pt has repeated requests for medicine to make her "goto sleep". Pt reports it is the anniversary of her sisters death.

## 2019-04-01 NOTE — ED Notes (Signed)
To CT

## 2019-04-01 NOTE — ED Notes (Signed)
Husband called and Nurse Navigator gave him an update and then gave him my number to call and check in whenever he would like

## 2019-04-01 NOTE — Progress Notes (Signed)
   04/01/19 1821  Clinical Encounter Type  Visited With Health care provider  Visit Type Initial;Trauma  Referral From Nurse   Chaplain responded to a trauma in the ED.  Patient was originally considered a Level I Trauma, and then downgraded to Level II. No family is present at this time. Spiritual care services available as needed.

## 2019-04-01 NOTE — ED Notes (Signed)
Aspen collar placed on pt.

## 2019-04-01 NOTE — Consult Note (Signed)
NAME:  Sara [name], MRN:  [id number], DOB:  [birth year], LOS: 0 ADMISSION DATE:  04/01/2019, CONSULTATION DATE:  5/11 REFERRING MD:  Dr. Ellender Hose, CHIEF COMPLAINT:  Hypotension   Brief History   43 year old female with alcoholic cirrhosis presented to ED after falling down a flight of stairs. Hypotensive on arrival requiring pressors.   History of present illness   43 year old female with PMH as below, which is significant for alcoholic cirrhosis who presented to [facility] ED 5/11 after falling down a flight of stairs (16 stairs). EMS reported the patient had been consuming alcohol earlier in the day to the tune of at least 68 ounces. The patient took two 25mg  seroquel in the presence of EMS stating she just wanted to sleep. In the ED she had hypotension.  She was given 2L IVF and SBP remained in high 80s to low 90s.  There were initially plans to start her on levophed; therefore, PCCM was asked to see in consultation.  At the time of our evaluation, RN informs that SBP was in low 100s earlier.  Levophed was not started.  MAP currently 69.  She is  Complaining of thirst and has very dry mucous membranes.  Additional IVF's ordered and EDP informed that pt is stable and suitable for [facility] admission.  Of note, pt informed ED staff that today was the anniversary of her sisters death.  She denies any intention of self harm however.  She would like to stop drinking and is asking about detox programs.  Past Medical History   has a past medical history of Alcohol abuse, Alcoholism ([place]), Anemia, Anxiety, Blood transfusion without reported diagnosis, Cirrhosis ([place]), Depression, Esophageal varices with bleeding(456.0) (06/13/2014), GERD (gastroesophageal reflux disease), Heart murmur, Menorrhagia, Pancytopenia ([place]) (01/15/2014), Pneumonia, Portal hypertension ([place]), S/P alcohol detoxification, and UGI bleed (06/12/2014).  Significant Hospital Events   5/11 > admit.  Consults:  None.  Procedures:  None.   Significant Diagnostic Tests:  CT head / C spine 5/11 > no acute process.  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  CT chest 5/11 > negative. CT A / P 5/11 > Right lower flank hematoma without active extravasation. Cirrhosis with sequela of portal hypertension.  Small 1cm nodule in hepatic segment 3, outpatient follow up recommended.  Micro Data:  SARS CoV 2 5/11 >   Antimicrobials:  None.   Interim history/subjective:  Asking for water to drink.  Has mild headache.  Objective   Blood pressure (!) 70/50, pulse 66, temperature 97.7 F (36.5 C), temperature source Oral, resp. rate 15, last menstrual period 10/02/2018, SpO2 96 %.       No intake or output data in the 24 hours ending 04/01/19 1849 There were no vitals filed for this visit.  Examination: General: Adult female, resting in bed, in NAD. Neuro: A&O x 3, no deficits. HEENT: [place]/AT. Sclerae anicteric.  Cervical collar in place.  She has c-spine tenderness on neck flexion.  No tenderness in neutral. Cardiovascular: RRR, no M/R/G.  Lungs: Respirations even and unlabored.  CTA bilaterally, No W/R/R. Abdomen: BS x 4, soft, NT/ND.  Musculoskeletal: No gross deformities, no edema.  Skin: Spider angiomas noted to upper chest and upper arms.  Skin otherwise intact, warm, no rashes.   Assessment & Plan:   Hx EtOH cirrhosis, portal HTN, esophageal varices (no hx of bleeding and not followed by GI). - CIWA per SDU protocol. - Thiamine / Folate / Multivitamin. - EtOH cessation counseling provided,  pt eager to stop drinking and is asking about detox programs. - Needs outpatient GI follow up.  1cm hepatic nodule. - Outpatient follow up.  Hypotension - unclear etiology. Of note, per chart review, pt's baseline SBP appears to be low 100's at best. - Continue MIVF's.  - No role for vasopressors at this time.  Seroquel ingestion per report. - Obtain EKG to assess QTc. - Pharmacy to  please perform complete med rec.  Hypokalemia. - 40 mEq K now. - Follow BMP.  Pancytopenia - chronic. - Monitor clinically.  Hypoglycemia - s/p 1amp D50. - CBG q4hrs.  Hx depression, anxiety, EtOH abuse. - Continue preadmission escitalopram, topiramate.   Pt stable for admission to progressive under TRH.  Discussed with EDP who is in agreement.  Nothing further to add.  PCCM will sign off.  Please do not hesitate to call us back if we can be of any further assistance.  Best practice:  Diet: Normal. Pain/Anxiety/Delirium protocol (if indicated): None. VAP protocol (if indicated): None. DVT prophylaxis: SCD's. GI prophylaxis: None. Glucose control: None. Mobility: Bedrest. Code Status: Full. Family Communication: None available. Disposition: Progressive under TRH.  Labs   CBC: Recent Labs  Lab 04/01/19 1740  WBC 2.3*  NEUTROABS 0.8*  HGB 9.5*  HCT 31.3*  MCV 88.7  PLT 93*    Basic Metabolic Panel: Recent Labs  Lab 04/01/19 1740  NA 144  K 3.4*  CL 115*  CO2 19*  GLUCOSE 90  BUN 5*  CREATININE 0.53  CALCIUM 8.1*   GFR: Estimated Creatinine Clearance: 91.5 mL/min (by C-G formula based on SCr of 0.53 mg/dL). Recent Labs  Lab 04/01/19 1740  WBC 2.3*    Liver Function Tests: Recent Labs  Lab 04/01/19 1740  AST 55*  ALT 27  ALKPHOS 82  BILITOT 0.6  PROT 6.7  ALBUMIN 3.2*   No results for input(s): LIPASE, AMYLASE in the last 168 hours. No results for input(s): AMMONIA in the last 168 hours.  ABG    Component Value Date/Time   TCO2 23 07/28/2015 1722     Coagulation Profile: No results for input(s): INR, PROTIME in the last 168 hours.  Cardiac Enzymes: No results for input(s): CKTOTAL, CKMB, CKMBINDEX, TROPONINI in the last 168 hours.  HbA1C: Hgb A1c MFr Bld  Date/Time Value Ref Range Status  05/04/2017 06:34 AM 4.5 (L) 4.8 - 5.6 % Final    Comment:    (NOTE)         Pre-diabetes: 5.7 - 6.4         Diabetes: >6.4          Glycemic control for adults with diabetes: <7.0     CBG: Recent Labs  Lab 04/01/19 1813  GLUCAP 68*    Review of Systems:   All negative; except for those that are bolded, which indicate positives.  Constitutional: weight loss, weight gain, night sweats, fevers, chills, fatigue, weakness.  HEENT: headache, sore throat, sneezing, nasal congestion, post nasal drip, difficulty swallowing, tooth/dental problems, visual complaints, visual changes, ear aches. Neuro: difficulty with speech, weakness, numbness, ataxia. CV:  chest pain, orthopnea, PND, swelling in lower extremities, dizziness, palpitations, syncope.  Resp: cough, hemoptysis, dyspnea, wheezing. GI: heartburn, indigestion, abdominal pain, nausea, vomiting, diarrhea, constipation, change in bowel habits, loss of appetite, hematemesis, melena, hematochezia.  GU: dysuria, change in color of urine, urgency or frequency, flank pain, hematuria. MSK: joint pain or swelling, decreased range of motion. Psych: change in mood or affect, depression, anxiety, suicidal  ideations, homicidal ideations. Skin: rash, itching, bruising.   Past Medical History  She,  has a past medical history of Alcohol abuse, Alcoholism ([place]), Anemia, Anxiety, Blood transfusion without reported diagnosis, Cirrhosis ([place]), Depression, Esophageal varices with bleeding(456.0) (06/13/2014), GERD (gastroesophageal reflux disease), Heart murmur, Menorrhagia, Pancytopenia ([place]) (01/15/2014), Pneumonia, Portal hypertension ([place]), S/P alcohol detoxification, and UGI bleed (06/12/2014).   Surgical History    Past Surgical History:  Procedure Laterality Date  . CHOLECYSTECTOMY    . ESOPHAGOGASTRODUODENOSCOPY N/A 06/12/2014   Procedure: ESOPHAGOGASTRODUODENOSCOPY (EGD);  Surgeon: Gatha Mayer, MD;  Location: [facility];  Service: Endoscopy;  Laterality: N/A;  . ESOPHAGOGASTRODUODENOSCOPY (EGD) WITH PROPOFOL N/A 07/29/2014   Procedure: ESOPHAGOGASTRODUODENOSCOPY (EGD) WITH  PROPOFOL;  Surgeon: Inda Castle, MD;  Location: [facility];  Service: Endoscopy;  Laterality: N/A;  . ESOPHAGOGASTRODUODENOSCOPY (EGD) WITH PROPOFOL N/A 01/20/2018   Procedure: ESOPHAGOGASTRODUODENOSCOPY (EGD) WITH PROPOFOL;  Surgeon: Mauri Pole, MD;  Location: [facility];  Service: Endoscopy;  Laterality: N/A;     Social History   reports that she has been smoking cigarettes. She has been smoking about 0.25 packs per day. She has never used smokeless tobacco. She reports current alcohol use. She reports that she does not use drugs.   Family History   Her family history includes Alcohol abuse in her maternal grandfather, maternal uncle, paternal aunt, and paternal grandfather; Alcoholism in her father and mother; Colon polyps in her mother; Hypertension in her mother; Thyroid disease in her mother.   Allergies Allergies  Allergen Reactions  . Morphine And Related Other (See Comments)    Slowed HR, lowered BP  . Nsaids Other (See Comments)    bleeding     Home Medications  Prior to Admission medications   Medication Sig Start Date End Date Taking? Authorizing Provider  chlordiazePOXIDE (LIBRIUM) 25 MG capsule 50mg  PO TID x 1D, then 25-50mg  PO BID X 1D, then 25-50mg  PO QD X 1D Patient not taking: Reported on 03/18/2019 03/10/19   Dorie Rank, MD  escitalopram (LEXAPRO) 10 MG tablet Take 10 mg by mouth daily.    [provider]  lactulose (CHRONULAC) 10 GM/15ML solution Take 15 mLs (10 g total) by mouth 3 (three) times daily. Patient not taking: Reported on 12/21/2018 09/08/18   Varney Biles, MD  lidocaine (XYLOCAINE) 2 % solution Use as directed 15 mLs in the mouth or throat as needed for mouth pain. 03/18/19   Larene Pickett, PA-C  Multiple Vitamin (MULTIVITAMIN WITH MINERALS) TABS tablet Take 1 tablet by mouth daily.    [provider]  thiamine (VITAMIN B-1) 100 MG tablet Take 1 tablet (100 mg total) by mouth daily. Patient not taking: Reported on  12/21/2018 09/11/17   Duffy Bruce, MD  topiramate (TOPAMAX) 25 MG capsule Take 25 mg by mouth 2 (two) times daily.    [provider]     Montey Hora, [facility] Pager: [phone].  If no answer, [phone] 04/01/2019, 8:38 PM

## 2019-04-01 NOTE — ED Notes (Signed)
Pt resting quietly at this time

## 2019-04-01 NOTE — H&P (Signed)
History and Physical    Sara [name] IRC:[id number] DOB: 04-25-1976 DOA: 04/01/2019  Referring MD/NP/PA:   PCP: Patient, No Pcp Per   Patient coming from:  The patient is coming from home.  At baseline, pt is independent for most of ADL.        Chief Complaint: fall  HPI: Sara [name] is a 43 y.o. female with medical history significant of alcoholic cirrhosis, portal vein hypertension, esophageal varices, pancytopenia, upper GI bleeding, pancreatitis, tobacco abuse, GERD, depression, anxiety, anemia, who presents with fall.  Per report, pt fell down a flight of 16 stairs. She denies LOC. She states that she injured her neck and left head. No unilateral weakness or numbness in extremities. EMS reported the patient had been consuming alcohol earlier in the day to the tune of at least 68 ounces. Pt reported to ED staff that today is the anniversary of her sisters death. The patient took two 25mg  seroquel in the presence of EMS stating she just wanted to sleep. She denies any intention of self harm. Patient denies any chest pain, shortness of breath, cough, fever or chills.  No nausea, vomiting, diarrhea, abdominal pain, symptoms of UTI. She was hypotensive initially with blood pressure 70/50, which improved to 100/72 after giving 2 L normal saline bolus and 1 L of Ringer's solution in ED.  ED Course: pt was found to have pancytopenia with WBC 2.3, hemoglobin 9.5, platelet 93 (hemoglobin 9.6 on 03/18/2019), lactic acid 1.7, INR 1.2, UDS positive for benzo, negative urinalysis, negative COVID-19 test, potassium 3.4, hypoglycemia with CBG 68, Tylenol level less than 10, salicylate level less than 7, alcohol level 337, negative pregnancy test, renal function normal, temperature normal, no tachycardia, oxygen saturation 96% on room air.  Chest x-ray negative.  X-ray of pelvis negative for fracture. Pt is place on progressive bed for obs. PCCM was consulted.  CT-abd/pelvis and chest showed: 1. No acute  intrathoracic abnormality detected. 2. Right lower flank hematoma without evidence of active extravasation. 3. No acute osseous abnormality detected.  4. Cirrhosis with sequela of portal hypertension. There is a small 1 cm hyperattenuating nodule in hepatic segment 3 as detailed above. This is incompletely characterized on this exam.  5. Moderately distended urinary bladder.  CT-head and C-spine: 1. No acute abnormality of the head or cervical spine. 2. 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  Review of Systems:   General: no fevers, chills, no body weight gain, has fatigue and headach. HEENT: no blurry vision, hearing changes or sore throat Respiratory: no dyspnea, coughing, wheezing CV: no chest pain, no palpitations GI: no nausea, vomiting, abdominal pain, diarrhea, constipation GU: no dysuria, burning on urination, increased urinary frequency, hematuria  Ext: no leg edema Neuro: no unilateral weakness, numbness, or tingling, no vision change or hearing loss. Had fall. Skin: no rash, no skin tear. MSK: No muscle spasm, no deformity, no limitation of range of movement in spin Heme: No easy bruising.  Travel history: No recent long distant travel.  Allergy:  Allergies  Allergen Reactions   Morphine And Related Other (See Comments)    Slowed HR, lowered BP   Nsaids Other (See Comments)    Caused internal bleeding    Past Medical History:  Diagnosis Date   Alcohol abuse    Alcoholism ([place])    Anemia    Anxiety    Blood transfusion without reported diagnosis    Cirrhosis ([place])    Depression    Esophageal varices with  bleeding(456.0) 06/13/2014   GERD (gastroesophageal reflux disease)    Heart murmur    Patient states she may have   Menorrhagia    Pancytopenia ([place]) 01/15/2014   Pneumonia    Portal hypertension ([place])    S/P alcohol detoxification    2-3 days at behavioral health previously   UGI  bleed 06/12/2014    Past Surgical History:  Procedure Laterality Date   CHOLECYSTECTOMY     ESOPHAGOGASTRODUODENOSCOPY N/A 06/12/2014   Procedure: ESOPHAGOGASTRODUODENOSCOPY (EGD);  Surgeon: Gatha Mayer, MD;  Location: [facility];  Service: Endoscopy;  Laterality: N/A;   ESOPHAGOGASTRODUODENOSCOPY (EGD) WITH PROPOFOL N/A 07/29/2014   Procedure: ESOPHAGOGASTRODUODENOSCOPY (EGD) WITH PROPOFOL;  Surgeon: Inda Castle, MD;  Location: [facility];  Service: Endoscopy;  Laterality: N/A;   ESOPHAGOGASTRODUODENOSCOPY (EGD) WITH PROPOFOL N/A 01/20/2018   Procedure: ESOPHAGOGASTRODUODENOSCOPY (EGD) WITH PROPOFOL;  Surgeon: Mauri Pole, MD;  Location: [facility];  Service: Endoscopy;  Laterality: N/A;    Social History:  reports that she has been smoking cigarettes. She has been smoking about 0.25 packs per day. She has never used smokeless tobacco. She reports current alcohol use. She reports that she does not use drugs.  Family History:  Family History  Problem Relation Age of Onset   Colon polyps Mother    Hypertension Mother    Thyroid disease Mother    Alcoholism Mother    Alcoholism Father    Alcohol abuse Maternal Grandfather    Alcohol abuse Paternal Grandfather    Alcohol abuse Paternal Aunt    Alcohol abuse Maternal Uncle      Prior to Admission medications   Medication Sig Start Date End Date Taking? Authorizing Provider  chlordiazePOXIDE (LIBRIUM) 25 MG capsule 50mg  PO TID x 1D, then 25-50mg  PO BID X 1D, then 25-50mg  PO QD X 1D Patient not taking: Reported on 03/18/2019 03/10/19   Dorie Rank, MD  escitalopram (LEXAPRO) 10 MG tablet Take 10 mg by mouth daily.    [provider]  lactulose (CHRONULAC) 10 GM/15ML solution Take 15 mLs (10 g total) by mouth 3 (three) times daily. 09/08/18   Varney Biles, MD  lidocaine (XYLOCAINE) 2 % solution Use as directed 15 mLs in the mouth or throat as needed for mouth pain. 03/18/19   Larene Pickett, PA-C    Multiple Vitamin (MULTIVITAMIN WITH MINERALS) TABS tablet Take 1 tablet by mouth daily.    [provider]  thiamine (VITAMIN B-1) 100 MG tablet Take 1 tablet (100 mg total) by mouth daily. 09/11/17   Duffy Bruce, MD  topiramate (TOPAMAX) 25 MG capsule Take 25 mg by mouth 2 (two) times daily.    [provider]    Physical Exam: Vitals:   04/01/19 2250 04/01/19 2300 04/01/19 2334 04/02/19 0314  BP: (!) 82/57 (!) 84/57 95/67 (!) 89/55  Pulse: 64 66 69 75  Resp: 16 17 15 16   Temp:   97.9 F (36.6 C) 98 F (36.7 C)  TempSrc:   Oral Oral  SpO2: 97% 96% 98% 90%  Weight:   73.8 kg   Height:   5\' 3"  (1.6 m)    General: Not in acute distress HEENT:       Eyes: PERRL, EOMI, no scleral icterus.       ENT: No discharge from the ears and nose, no pharynx injection, no tonsillar enlargement.        Neck: No JVD, no bruit, no mass felt. Heme: No neck lymph node enlargement. Cardiac: S1/S2,  RRR, No murmurs, No gallops or rubs. Respiratory: No rales, wheezing, rhonchi or rubs. GI: Soft, nondistended, nontender, no rebound pain, no organomegaly, BS present. GU: No hematuria Ext: No pitting leg edema bilaterally. 2+DP/PT pulse bilaterally. Musculoskeletal: No joint deformities, No joint redness or warmth, no limitation of ROM in spin. Skin: No rashes.  Neuro: Alert, oriented X3, cranial nerves II-XII grossly intact, moves all extremities normally.  Psych: Patient is not psychotic, no suicidal or hemocidal ideation.  Labs on Admission: I have personally reviewed following labs and imaging studies  CBC: Recent Labs  Lab 04/01/19 1740 04/02/19 0017 04/02/19 0307  WBC 2.3* 2.1* 2.1*  NEUTROABS 0.8*  --   --   HGB 9.5* 9.4* 8.6*  HCT 31.3* 30.1* 27.1*  MCV 88.7 87.2 87.4  PLT 93* 77* 72*   Basic Metabolic Panel: Recent Labs  Lab 04/01/19 1740 04/01/19 2120 04/02/19 0307  NA 144  --  146*  K 3.4*  --  3.6  CL 115*  --  117*  CO2 19*  --  20*  GLUCOSE 90  --   80  BUN 5*  --  <5*  CREATININE 0.53  --  0.47  CALCIUM 8.1*  --  7.7*  MG  --  2.0  --    GFR: Estimated Creatinine Clearance: 89.1 mL/min (by C-G formula based on SCr of 0.47 mg/dL). Liver Function Tests: Recent Labs  Lab 04/01/19 1740  AST 55*  ALT 27  ALKPHOS 82  BILITOT 0.6  PROT 6.7  ALBUMIN 3.2*   No results for input(s): LIPASE, AMYLASE in the last 168 hours. Recent Labs  Lab 04/02/19 0017  AMMONIA 54*   Coagulation Profile: Recent Labs  Lab 04/01/19 1741  INR 1.2   Cardiac Enzymes: No results for input(s): CKTOTAL, CKMB, CKMBINDEX, TROPONINI in the last 168 hours. BNP (last 3 results) No results for input(s): PROBNP in the last 8760 hours. HbA1C: No results for input(s): HGBA1C in the last 72 hours. CBG: Recent Labs  Lab 04/01/19 1813 04/01/19 2333 04/02/19 0331  GLUCAP 68* 80 87   Lipid Profile: No results for input(s): CHOL, HDL, LDLCALC, TRIG, CHOLHDL, LDLDIRECT in the last 72 hours. Thyroid Function Tests: Recent Labs    04/02/19 0017  TSH 1.639   Anemia Panel: No results for input(s): VITAMINB12, FOLATE, FERRITIN, TIBC, IRON, RETICCTPCT in the last 72 hours. Urine analysis:    Component Value Date/Time   COLORURINE STRAW (A) 04/01/2019 1937   APPEARANCEUR CLEAR 04/01/2019 1937   LABSPEC 1.002 (L) 04/01/2019 1937   PHURINE 6.0 04/01/2019 1937   GLUCOSEU NEGATIVE 04/01/2019 1937   HGBUR SMALL (A) 04/01/2019 1937   BILIRUBINUR NEGATIVE 04/01/2019 1937   BILIRUBINUR Neg 05/08/2015 0919   KETONESUR NEGATIVE 04/01/2019 1937   PROTEINUR NEGATIVE 04/01/2019 1937   UROBILINOGEN 0.2 07/28/2015 1711   NITRITE NEGATIVE 04/01/2019 1937   LEUKOCYTESUR NEGATIVE 04/01/2019 1937   Sepsis Labs: @LABRCNTIP (procalcitonin:4,lacticidven:4) ) Recent Results (from the past 240 hour(s))  SARS Coronavirus 2 (CEPHEID - Performed in [place] hospital lab), Hosp Order     Status: None   Collection Time: 04/01/19  8:55 PM  Result Value Ref Range  Status   SARS Coronavirus 2 NEGATIVE NEGATIVE Final    Comment: (NOTE) If result is NEGATIVE SARS-CoV-2 target nucleic acids are NOT DETECTED. The SARS-CoV-2 RNA is generally detectable in upper and lower  respiratory specimens during the acute phase of infection. The lowest  concentration of SARS-CoV-2 viral copies this assay can detect  is 250  copies / mL. A negative result does not preclude SARS-CoV-2 infection  and should not be used as the sole basis for treatment or other  patient management decisions.  A negative result may occur with  improper specimen collection / handling, submission of specimen other  than nasopharyngeal swab, presence of viral mutation(s) within the  areas targeted by this assay, and inadequate number of viral copies  (<250 copies / mL). A negative result must be combined with clinical  observations, patient history, and epidemiological information. If result is POSITIVE SARS-CoV-2 target nucleic acids are DETECTED. The SARS-CoV-2 RNA is generally detectable in upper and lower  respiratory specimens dur ing the acute phase of infection.  Positive  results are indicative of active infection with SARS-CoV-2.  Clinical  correlation with patient history and other diagnostic information is  necessary to determine patient infection status.  Positive results do  not rule out bacterial infection or co-infection with other viruses. If result is PRESUMPTIVE POSTIVE SARS-CoV-2 nucleic acids MAY BE PRESENT.   A presumptive positive result was obtained on the submitted specimen  and confirmed on repeat testing.  While 2019 novel coronavirus  (SARS-CoV-2) nucleic acids may be present in the submitted sample  additional confirmatory testing may be necessary for epidemiological  and / or clinical management purposes  to differentiate between  SARS-CoV-2 and other Sarbecovirus currently known to infect humans.  If clinically indicated additional testing with an alternate  test  methodology [phone]) is advised. The SARS-CoV-2 RNA is generally  detectable in upper and lower respiratory sp ecimens during the acute  phase of infection. The expected result is Negative. Fact Sheet for Patients:  StrictlyIdeas.no Fact Sheet for Healthcare Providers: BankingDealers.co.za This test is not yet approved or cleared by the [place] FDA and has been authorized for detection and/or diagnosis of SARS-CoV-2 by FDA under an Emergency Use Authorization (EUA).  This EUA will remain in effect (meaning this test can be used) for the duration of the COVID-19 declaration under Section 564(b)(1) of the Act, 21 U.S.C. section 360bbb-3(b)(1), unless the authorization is terminated or revoked sooner. Performed at [facility], [place] [address] [place], [place] [address]   MRSA PCR Screening     Status: Abnormal   Collection Time: 04/01/19 11:36 PM  Result Value Ref Range Status   MRSA by PCR POSITIVE (A) NEGATIVE Final    Comment:        The GeneXpert MRSA Assay (FDA approved for NASAL specimens only), is one component of a comprehensive MRSA colonization surveillance program. It is not intended to diagnose MRSA infection nor to guide or monitor treatment for MRSA infections. RESULT CALLED TO, READ BACK BY AND VERIFIED WITH: MILLER,J RN [id number] 04/02/2019 MITCHELL,L Performed at [facility], [place] [address] [place], [place] [address]      Radiological Exams on Admission: Ct Head Wo Contrast  Result Date: 04/01/2019 CLINICAL DATA:  Fall down stairs EXAM: CT HEAD WITHOUT CONTRAST CT CERVICAL SPINE WITHOUT CONTRAST TECHNIQUE: Multidetector CT imaging of the head and cervical spine was performed following the standard protocol without intravenous contrast. Multiplanar CT image reconstructions of the cervical spine were also generated. COMPARISON:  Head CT 02/25/2018, 12/19/2017 FINDINGS: CT HEAD FINDINGS Brain:  There is no mass, hemorrhage or extra-axial collection. The size and configuration of the ventricles and extra-axial CSF spaces are normal. The brain parenchyma is normal, without evidence of acute or chronic infarction. There are multiple scattered parenchymal calcifications, unchanged.  Vascular: No abnormal hyperdensity of the major intracranial arteries or dural venous sinuses. No intracranial atherosclerosis. Skull: The visualized skull base, calvarium and extracranial soft tissues are normal. Sinuses/Orbits: No fluid levels or advanced mucosal thickening of the visualized paranasal sinuses. No mastoid or middle ear effusion. The orbits are normal. CT CERVICAL SPINE FINDINGS Alignment: No static subluxation. Facets are aligned. Occipital condyles are normally positioned. Skull base and vertebrae: No acute fracture. Soft tissues and spinal canal: No prevertebral fluid or swelling. No visible canal hematoma. Disc levels: No advanced spinal canal or neural foraminal stenosis. Upper chest: No pneumothorax, pulmonary nodule or pleural effusion. Other: Normal visualized paraspinal cervical soft tissues. IMPRESSION: 1. No acute abnormality of the head or cervical spine. 2. 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 Electronically Signed   By: Ulyses Jarred M.D.   On: 04/01/2019 19:04   Ct Chest W Contrast  Result Date: 04/01/2019 CLINICAL DATA:  Acute pain due to trauma EXAM: CT CHEST AND ABDOMEN WITH CONTRAST TECHNIQUE: Multidetector CT imaging of the chest and abdomen was performed following the standard protocol during bolus administration of intravenous contrast. CONTRAST:  17mL OMNIPAQUE IOHEXOL 300 MG/ML  SOLN COMPARISON:  CT dated 03/02 2019 CT abdomen pelvis dated 08/30/2018. FINDINGS: CT CHEST FINDINGS Cardiovascular: No significant vascular findings. Normal heart size. No pericardial effusion. Mediastinum/Nodes: No enlarged mediastinal, hilar, or  axillary lymph nodes. Thyroid gland, trachea, and esophagus demonstrate no significant findings. Lungs/Pleura: Lungs are clear. No pleural effusion or pneumothorax. Musculoskeletal: No chest wall mass or suspicious bone lesions identified. CT ABDOMEN FINDINGS Hepatobiliary: There is cirrhosis with sequela of portal hypertension including recanalization of the umbilical vein. There is a 1 cm hypoattenuating nodule in hepatic segment 3 (axial series 3, image 72). This does not definitively identify washout on the delayed phase. This lesion is present on prior CT and appears stable. This same lesion is not readily identified on CT from 2016. The patient is status post prior cholecystectomy. There is moderate dilatation of the common bile duct which is similar to prior studies. Pancreas: Unremarkable. No pancreatic ductal dilatation or surrounding inflammatory changes. Spleen: The spleen is significantly enlarged. There is some portosystemic shunting. Adrenals/Urinary Tract: Adrenal glands are unremarkable. Kidneys are normal, without renal calculi, focal lesion, or hydronephrosis. The bladder is moderately distended. Stomach/Bowel: Stomach is within normal limits. Appendix appears normal. No evidence of bowel wall thickening, distention, or inflammatory changes. Vascular/Lymphatic: No significant atherosclerotic changes. No abdominal aortic aneurysm. Again identified are findings of portal hypertension. Other: There is a right flank hematoma without evidence of active extravasation. Reproductive: The uterus is unremarkable.  There is no adnexal mass. Musculoskeletal: No acute or significant osseous findings. IMPRESSION: 1. No acute intrathoracic abnormality detected. 2. Right lower flank hematoma without evidence of active extravasation. 3. No acute osseous abnormality detected. 4. Cirrhosis with sequela of portal hypertension. There is a small 1 cm hyperattenuating nodule in hepatic segment 3 as detailed above. This  is incompletely characterized on this exam. Outpatient follow-up with a liver mass protocol CT or MRI is recommended for further evaluation of this finding. 5. Moderately distended urinary bladder. Electronically Signed   By: Constance Holster M.D.   On: 04/01/2019 19:20   Ct Cervical Spine Wo Contrast  Result Date: 04/01/2019 CLINICAL DATA:  Fall down stairs EXAM: CT HEAD WITHOUT CONTRAST CT CERVICAL SPINE WITHOUT CONTRAST TECHNIQUE: Multidetector CT imaging of the head and cervical spine was performed following the standard protocol without intravenous contrast. Multiplanar  CT image reconstructions of the cervical spine were also generated. COMPARISON:  Head CT 02/25/2018, 12/19/2017 FINDINGS: CT HEAD FINDINGS Brain: There is no mass, hemorrhage or extra-axial collection. The size and configuration of the ventricles and extra-axial CSF spaces are normal. The brain parenchyma is normal, without evidence of acute or chronic infarction. There are multiple scattered parenchymal calcifications, unchanged. Vascular: No abnormal hyperdensity of the major intracranial arteries or dural venous sinuses. No intracranial atherosclerosis. Skull: The visualized skull base, calvarium and extracranial soft tissues are normal. Sinuses/Orbits: No fluid levels or advanced mucosal thickening of the visualized paranasal sinuses. No mastoid or middle ear effusion. The orbits are normal. CT CERVICAL SPINE FINDINGS Alignment: No static subluxation. Facets are aligned. Occipital condyles are normally positioned. Skull base and vertebrae: No acute fracture. Soft tissues and spinal canal: No prevertebral fluid or swelling. No visible canal hematoma. Disc levels: No advanced spinal canal or neural foraminal stenosis. Upper chest: No pneumothorax, pulmonary nodule or pleural effusion. Other: Normal visualized paraspinal cervical soft tissues. IMPRESSION: 1. No acute abnormality of the head or cervical spine. 2. 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 Electronically Signed   By: Ulyses Jarred M.D.   On: 04/01/2019 19:04   Ct Abdomen Pelvis W Contrast  Result Date: 04/01/2019 CLINICAL DATA:  Acute pain due to trauma EXAM: CT CHEST AND ABDOMEN WITH CONTRAST TECHNIQUE: Multidetector CT imaging of the chest and abdomen was performed following the standard protocol during bolus administration of intravenous contrast. CONTRAST:  13mL OMNIPAQUE IOHEXOL 300 MG/ML  SOLN COMPARISON:  CT dated 03/02 2019 CT abdomen pelvis dated 08/30/2018. FINDINGS: CT CHEST FINDINGS Cardiovascular: No significant vascular findings. Normal heart size. No pericardial effusion. Mediastinum/Nodes: No enlarged mediastinal, hilar, or axillary lymph nodes. Thyroid gland, trachea, and esophagus demonstrate no significant findings. Lungs/Pleura: Lungs are clear. No pleural effusion or pneumothorax. Musculoskeletal: No chest wall mass or suspicious bone lesions identified. CT ABDOMEN FINDINGS Hepatobiliary: There is cirrhosis with sequela of portal hypertension including recanalization of the umbilical vein. There is a 1 cm hypoattenuating nodule in hepatic segment 3 (axial series 3, image 72). This does not definitively identify washout on the delayed phase. This lesion is present on prior CT and appears stable. This same lesion is not readily identified on CT from 2016. The patient is status post prior cholecystectomy. There is moderate dilatation of the common bile duct which is similar to prior studies. Pancreas: Unremarkable. No pancreatic ductal dilatation or surrounding inflammatory changes. Spleen: The spleen is significantly enlarged. There is some portosystemic shunting. Adrenals/Urinary Tract: Adrenal glands are unremarkable. Kidneys are normal, without renal calculi, focal lesion, or hydronephrosis. The bladder is moderately distended. Stomach/Bowel: Stomach is within normal limits. Appendix appears  normal. No evidence of bowel wall thickening, distention, or inflammatory changes. Vascular/Lymphatic: No significant atherosclerotic changes. No abdominal aortic aneurysm. Again identified are findings of portal hypertension. Other: There is a right flank hematoma without evidence of active extravasation. Reproductive: The uterus is unremarkable.  There is no adnexal mass. Musculoskeletal: No acute or significant osseous findings. IMPRESSION: 1. No acute intrathoracic abnormality detected. 2. Right lower flank hematoma without evidence of active extravasation. 3. No acute osseous abnormality detected. 4. Cirrhosis with sequela of portal hypertension. There is a small 1 cm hyperattenuating nodule in hepatic segment 3 as detailed above. This is incompletely characterized on this exam. Outpatient follow-up with a liver mass protocol CT or MRI is recommended for further evaluation of this finding. 5. Moderately distended urinary  bladder. Electronically Signed   By: Constance Holster M.D.   On: 04/01/2019 19:20   Dg Pelvis Portable  Result Date: 04/01/2019 CLINICAL DATA:  Status post fall down 16 stairs today. Pain. Initial encounter. EXAM: PORTABLE PELVIS 1-2 VIEWS COMPARISON:  CT abdomen and pelvis 08/30/2018. FINDINGS: There is no evidence of pelvic fracture or diastasis. No pelvic bone lesions are seen. IMPRESSION: Normal exam. Electronically Signed   By: Inge Rise M.D.   On: 04/01/2019 18:23   Dg Chest Portable 1 View  Result Date: 04/01/2019 CLINICAL DATA:  Fall. EXAM: PORTABLE CHEST 1 VIEW COMPARISON:  Chest x-ray dated March 18, 2019. FINDINGS: The patient is rotated to the left. The heart size and mediastinal contours are within normal limits. Both lungs are clear. The visualized skeletal structures are unremarkable. IMPRESSION: No active disease. Electronically Signed   By: Titus Dubin M.D.   On: 04/01/2019 18:06     EKG:  Not done in ED, will get one.   Assessment/Plan Principal  Problem:   Hypotension Active Problems:   Anxiety state   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HCC)   Depression   GERD (gastroesophageal reflux disease)   Pancytopenia ([facility])   ETOH abuse   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[place])   Fall   Hypokalemia   Nodule on liver   Right flank hematoma   Hypoglycemia   Hypotension: Etiology is not clear.  Patient blood pressure is normally running low.  Patient may have dehydration secondary to alcohol abuse. She has right flank hematoma, hemoglobin has only slightly decreased from a 9.6-->8.6.  This decrease of hemoglobin does not seem to cause to hypotension. PCCM was consulted, PA, Rahul Desai and Dr. Gilford Raid evaluated pt, and they did not think pt needs Vasopressor.  -will place on progressive bed for observation -IVF: 1L Ringer's solution, 2 L normal saline bolus, followed by 125 cc/h of ringer solution--> bolus as needed -Check cortisol level -Follow-up of blood culture  Pancytopenia: this is a chronic issues.  -pt is following up with hematologist, Dr. 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 ammonia 54. No signs of GIB. -check PTT -continue lactulose  Depression and anxiety: Stable, no suicidal or homicidal ideations. Pt took two 25 mg of Seroquel. -Hold Seroquel -Continue home Lexapro and Topamax -Follow-up EKG to monitor QTC  GERD (gastroesophageal reflux disease): -on IV protonix  Fall: likely due to alcohol intoxication.  CT of head and neck is negative for acute issues. -Observe closely  Hypokalemia: K= 3.4 on admission. - Repleted - Check Mg level  Nodule on liver: -f/u with [facility]  Right flank hematoma: hgb slightly dropped 9.6-->8.6 which is partially from IVF-dilution effect -FFP was ordered in ED -Follow-up CBC every 6  hours  Hypoglycemia: CBG 68, likely due to decreased oral intake secondary to alcohol abuse -CBG every hour -D50 PR   DVT ppx: SCD Code Status: Full code Family Communication: None at bed side.   Disposition Plan:  Anticipate discharge back to previous home environment Consults called:  PCCM Admission status:    SDU/obs  Date of Service 04/02/2019    [facility]   If 7PM-7AM, please contact night-coverage [email or web address] Password [facility] 04/02/2019, 7:15 AM

## 2019-04-01 NOTE — ED Notes (Signed)
Fast exam by Dr. Myrene Buddy

## 2019-04-01 NOTE — ED Notes (Signed)
C-collar in place

## 2019-04-02 ENCOUNTER — Other Ambulatory Visit: Payer: Self-pay

## 2019-04-02 DIAGNOSIS — F329 Major depressive disorder, single episode, unspecified: Secondary | ICD-10-CM

## 2019-04-02 LAB — CDS SEROLOGY

## 2019-04-02 LAB — BASIC METABOLIC PANEL
Anion gap: 9 (ref 5–15)
BUN: <5 mg/dL — ABNORMAL LOW (ref 6–20)
CO2: 20 mmol/L — ABNORMAL LOW (ref 22–32)
Calcium: 7.7 mg/dL — ABNORMAL LOW (ref 8.9–10.3)
Chloride: 117 mmol/L — ABNORMAL HIGH (ref 98–111)
Creatinine, Ser: 0.47 mg/dL (ref 0.44–1.00)
GFR calc Af Amer: >60 mL/min (ref >60)
GFR calc non Af Amer: >60 mL/min (ref >60)
Glucose, Bld: 80 mg/dL (ref 70–99)
Potassium: 3.6 mmol/L (ref 3.5–5.1)
Sodium: 146 mmol/L — ABNORMAL HIGH (ref 135–145)

## 2019-04-02 LAB — BPAM FFP
Blood Product Expiration Date: 202005212359
Blood Product Expiration Date: 202005222359
ISSUE DATE / TIME: 202005111759
ISSUE DATE / TIME: 202005111759
Unit Type and Rh: 6200
Unit Type and Rh: 6200

## 2019-04-02 LAB — BLOOD PRODUCT ORDER (VERBAL) VERIFICATION

## 2019-04-02 LAB — PATHOLOGIST SMEAR REVIEW

## 2019-04-02 LAB — CBC
HCT: 30.1 % — ABNORMAL LOW (ref 36.0–46.0)
HCT: 27.1 % — ABNORMAL LOW (ref 36.0–46.0)
HCT: 27.8 % — ABNORMAL LOW (ref 36.0–46.0)
Hemoglobin: 9.4 g/dL — ABNORMAL LOW (ref 12.0–15.0)
Hemoglobin: 8.6 g/dL — ABNORMAL LOW (ref 12.0–15.0)
Hemoglobin: 8.9 g/dL — ABNORMAL LOW (ref 12.0–15.0)
MCH: 27.2 pg (ref 26.0–34.0)
MCH: 27.7 pg (ref 26.0–34.0)
MCH: 28 pg (ref 26.0–34.0)
MCHC: 31.2 g/dL (ref 30.0–36.0)
MCHC: 31.7 g/dL (ref 30.0–36.0)
MCHC: 32 g/dL (ref 30.0–36.0)
MCV: 87.2 fL (ref 80.0–100.0)
MCV: 87.4 fL (ref 80.0–100.0)
MCV: 87.4 fL (ref 80.0–100.0)
Platelets: 77 10*3/uL — ABNORMAL LOW (ref 150–400)
Platelets: 72 10*3/uL — ABNORMAL LOW (ref 150–400)
Platelets: 66 10*3/uL — ABNORMAL LOW (ref 150–400)
RBC: 3.45 MIL/uL — ABNORMAL LOW (ref 3.87–5.11)
RBC: 3.1 MIL/uL — ABNORMAL LOW (ref 3.87–5.11)
RBC: 3.18 MIL/uL — ABNORMAL LOW (ref 3.87–5.11)
RDW: 17.7 % — ABNORMAL HIGH (ref 11.5–15.5)
RDW: 17.8 % — ABNORMAL HIGH (ref 11.5–15.5)
RDW: 17.8 % — ABNORMAL HIGH (ref 11.5–15.5)
WBC: 2.1 10*3/uL — ABNORMAL LOW (ref 4.0–10.5)
WBC: 2.1 10*3/uL — ABNORMAL LOW (ref 4.0–10.5)
WBC: 1.7 10*3/uL — ABNORMAL LOW (ref 4.0–10.5)
nRBC: 0 % (ref 0.0–0.2)
nRBC: 0 % (ref 0.0–0.2)
nRBC: 0 % (ref 0.0–0.2)

## 2019-04-02 LAB — MRSA PCR SCREENING: MRSA by PCR: POSITIVE — AB

## 2019-04-02 LAB — GLUCOSE, CAPILLARY
Glucose-Capillary: 87 mg/dL (ref 70–99)
Glucose-Capillary: 79 mg/dL (ref 70–99)
Glucose-Capillary: 75 mg/dL (ref 70–99)
Glucose-Capillary: 131 mg/dL — ABNORMAL HIGH (ref 70–99)
Glucose-Capillary: 95 mg/dL (ref 70–99)

## 2019-04-02 LAB — PREPARE FRESH FROZEN PLASMA
Unit division: 0
Unit division: 0

## 2019-04-02 LAB — APTT: aPTT: 39 seconds — ABNORMAL HIGH (ref 24–36)

## 2019-04-02 LAB — TSH: TSH: 1.639 u[IU]/mL (ref 0.350–4.500)

## 2019-04-02 LAB — HIV ANTIBODY (ROUTINE TESTING W REFLEX): HIV Screen 4th Generation wRfx: NONREACTIVE

## 2019-04-02 LAB — CORTISOL-AM, BLOOD: Cortisol - AM: 1.2 ug/dL — ABNORMAL LOW (ref 6.7–22.6)

## 2019-04-02 LAB — PREGNANCY, URINE: Preg Test, Ur: NEGATIVE

## 2019-04-02 LAB — AMMONIA: Ammonia: 54 umol/L — ABNORMAL HIGH (ref 9–35)

## 2019-04-02 MED ORDER — INSULIN ASPART 100 UNIT/ML ~~LOC~~ SOLN: 0.0000 [IU] | Freq: Every day | SUBCUTANEOUS | Status: DC

## 2019-04-02 MED ORDER — MUPIROCIN 2 % EX OINT
1.0000 "application " | TOPICAL_OINTMENT | Freq: Two times a day (BID) | CUTANEOUS | Status: DC
  Administered 2019-04-02 – 2019-04-03 (×3): 1 via NASAL
  Filled 2019-04-02: qty 22

## 2019-04-02 MED ORDER — SODIUM CHLORIDE 0.9 % IV BOLUS
500.0000 mL | Freq: Once | INTRAVENOUS | Status: AC
  Administered 2019-04-02: 500 mL via INTRAVENOUS

## 2019-04-02 MED ORDER — INSULIN ASPART 100 UNIT/ML ~~LOC~~ SOLN: 0.0000 [IU] | Freq: Three times a day (TID) | SUBCUTANEOUS | Status: DC

## 2019-04-02 MED ORDER — CHLORHEXIDINE GLUCONATE CLOTH 2 % EX PADS
6.0000 | MEDICATED_PAD | Freq: Every day | CUTANEOUS | Status: DC
  Administered 2019-04-02: 6 via TOPICAL

## 2019-04-02 NOTE — Progress Notes (Signed)
Pt 1700 blood sugar 131. No orders for sliding scale insulin. Awaiting orders.

## 2019-04-02 NOTE — Consult Note (Signed)
[facility] Face-to-Face Psychiatry Consult   Reason for Consult:  Depression   Referring Physician:  Dr. 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 female patient admitted with hypotension.  HPI:   Per chart review, patient was admitted with hypotension (80s/60s) in setting of dehydration/alcohol use. She fell down a flight of 16 stairs and injured her neck and left side of her head. She reports taking two 25 mg Seroquel tablets while drinking to go to sleep. She denies that it was a suicide attempt but does report that it was the anniversary of her sister's death. BAL was 337 and UDS was positive for benzodiazepines on admission. She is on a CIWA for alcohol withdrawal and received IVF. Of note, patient was last seen by the psychiatry consult service in 08/2017 for alcohol intoxication with SI. She was psychiatrically cleared and was provided with resources for substance abuse treatment. Home medications include Lexapro 10 mg daily, Seroquel 50 mg qhs and Topamax 25 mg BID.   On interview, Ms. Wesche reports that she fell down the stairs and does not remember how.  She reports that she was intoxicated.  She reports drinking 4 beers prior to hospitalization.  She reports that she has not had anything to drink for 2 weeks.  She reports having a difficult day due to her sister's death anniversary.  She is open to going to grief counseling and actually completed grief counseling in the past after her  father's death.  She reports fluctuations in her mood.  She reports that when she is feeling well she is more goal oriented although impulsive.  She reports fair sleep due to occasional nighttime awakenings.  She denies SI, HI or AVH.  She denies a history of suicide attempts.  She denies taking more than prescribed of her Seroquel prior to hospitalization.  She reports compliance with her medications.  She has been taking her medications for 1.5 years.  Past Psychiatric History: Alcohol abuse, depression and anxiety.   Risk to Self:  None. Denies SI.  Risk to Others:  None. Denies HI.  Prior Inpatient Therapy:  She was last hospitalized at [facility] in 04/2017 for alcohol intoxication and depression.  Prior Outpatient Therapy:  [facility]. Prior medications include Campral.   Past Medical History:  Past Medical History:  Diagnosis Date  . Alcohol abuse   . Alcoholism ([place])   . Anemia   . Anxiety   . Blood transfusion without reported diagnosis   . Cirrhosis ([place])   . Depression   . 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 GERD (gastroesophageal reflux disease)   . Heart murmur    Patient states she may have  . Menorrhagia   . Pancytopenia ([place]) 01/15/2014  . Pneumonia   . Portal hypertension ([place])   . S/P alcohol detoxification    2-3 days at behavioral health previously  . UGI bleed 06/12/2014    Past Surgical History:  Procedure Laterality Date  . CHOLECYSTECTOMY    .  ESOPHAGOGASTRODUODENOSCOPY N/A 06/12/2014   Procedure: ESOPHAGOGASTRODUODENOSCOPY (EGD);  Surgeon: Gatha Mayer, MD;  Location: [facility];  Service: Endoscopy;  Laterality: N/A;  . ESOPHAGOGASTRODUODENOSCOPY (EGD) WITH PROPOFOL N/A 07/29/2014   Procedure: ESOPHAGOGASTRODUODENOSCOPY (EGD) WITH PROPOFOL;  Surgeon: Inda Castle, MD;  Location: [facility];  Service: Endoscopy;  Laterality: N/A;  . ESOPHAGOGASTRODUODENOSCOPY (EGD) WITH PROPOFOL N/A 01/20/2018   Procedure: ESOPHAGOGASTRODUODENOSCOPY (EGD) WITH  PROPOFOL;  Surgeon: Mauri Pole, MD;  Location: [facility] END[facility];  Service: Endoscopy;  Laterality: N/A;   Family History:  Family History  Problem Relation Age of Onset  . Colon polyps Mother   . Hypertension Mother   . Thyroid disease Mother   . Alcoholism Mother   . Alcoholism Father   . Alcohol abuse Maternal Grandfather   . Alcohol abuse Paternal Grandfather   . Alcohol abuse Paternal Aunt   . Alcohol abuse Maternal Uncle    Family Psychiatric  History: As listed above.  Social History:  Social History   Substance and Sexual Activity  Alcohol Use Yes  . Alcohol/week: 0.0 standard drinks   Comment: Usually drinks 2-3 bottles of wine daily when drinking.      Social History   Substance and Sexual Activity  Drug Use No  . Types: Cocaine, Marijuana   Comment: denies    Social History   Socioeconomic History  . Marital status: Married    Spouse name: [name]  . Number of children: 2  . Years of education: Associates  . Highest education level: Not on file  Occupational History  . Occupation: Radio broadcast assistant  Social Needs  . Financial resource strain: Not on file  . Food insecurity:    Worry: Not on file    Inability: Not on file  . Transportation needs:    Medical: Not on file    Non-medical: Not on file  Tobacco Use  . Smoking status: Current Every Day Smoker    Packs/day: 0.25    Types: Cigarettes  . Smokeless tobacco: Never Used  Substance and Sexual Activity  . Alcohol use: Yes    Alcohol/week: 0.0 standard drinks    Comment: Usually drinks 2-3 bottles of wine daily when drinking.   . Drug use: No    Types: Cocaine, Marijuana    Comment: denies  . Sexual activity: Never    Birth control/protection: None  Lifestyle  . Physical activity:    Days per week: Not on file    Minutes per session: Not on file  . Stress: Not on file  Relationships  . Social connections:    Talks on phone: Not on file    Gets together: Not on file    Attends religious  service: Not on file    Active member of club or organization: Not on file    Attends meetings of clubs or organizations: Not on file    Relationship status: Not on file  Other Topics Concern  . Not on file  Social History Narrative   Lives with husband and son. Daughter lives nearby. 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   Additional Social History: She lives at home with her husband and 41 y/o son. She is unemployed. She reports a history of alcohol abuse with 8 months as her longest period of sobriety when attending [facility] groups. She denies illicit substance use.     Allergies:   Allergies  Allergen Reactions  . Morphine And Related Other (See Comments)    Slowed HR, lowered  BP  . Nsaids Other (See Comments)    Caused internal bleeding    Labs:  Results for orders placed or performed during the hospital encounter of 04/01/19 (from the past 48 hour(s))  Ethanol     Status: Abnormal   Collection Time: 04/01/19  5:40 PM  Result Value Ref Range   Alcohol, Ethyl (B) 337 (HH) <10 mg/dL    Comment: CRITICAL RESULT CALLED TO, READ BACK BY AND VERIFIED WITH: C.GROSE RN [id number] 04/01/2019 MCCORMICK K (NOTE) Lowest detectable limit for serum alcohol is 10 mg/dL. For medical purposes only. Performed at [facility], [place] [address], [place], [place] [address]   Comprehensive metabolic panel     Status: Abnormal   Collection Time: 04/01/19  5:40 PM  Result Value Ref Range   Sodium 144 135 - 145 mmol/L   Potassium 3.4 (L) 3.5 - 5.1 mmol/L   Chloride 115 (H) 98 - 111 mmol/L   CO2 19 (L) 22 - 32 mmol/L   Glucose, Bld 90 70 - 99 mg/dL   BUN 5 (L) 6 - 20 mg/dL   Creatinine, Ser 0.53 0.44 - 1.00 mg/dL   Calcium 8.1 (L) 8.9 - 10.3 mg/dL   Total Protein 6.7 6.5 - 8.1 g/dL   Albumin 3.2 (L) 3.5 - 5.0 g/dL   AST 55 (H) 15 - 41 U/L   ALT 27 0 - 44 U/L   Alkaline Phosphatase 82 38 - 126 U/L   Total Bilirubin 0.6 0.3 - 1.2 mg/dL   GFR calc non Af Amer >60 >60  mL/min   GFR calc Af Amer >60 >60 mL/min   Anion gap 10 5 - 15    Comment: Performed at [facility], [place] [address], [place], [place] [address]  CBC with Differential     Status: Abnormal   Collection Time: 04/01/19  5:40 PM  Result Value Ref Range   WBC 2.3 (L) 4.0 - 10.5 K/uL   RBC 3.53 (L) 3.87 - 5.11 MIL/uL   Hemoglobin 9.5 (L) 12.0 - 15.0 g/dL   HCT 31.3 (L) 36.0 - 46.0 %   MCV 88.7 80.0 - 100.0 fL   MCH 26.9 26.0 - 34.0 pg   MCHC 30.4 30.0 - 36.0 g/dL   RDW 17.8 (H) 11.5 - 15.5 %   Platelets 93 (L) 150 - 400 K/uL    Comment: REPEATED TO VERIFY PLATELET COUNT CONFIRMED BY SMEAR Immature Platelet Fraction may be clinically indicated, consider ordering this additional test [id number]    nRBC 0.0 0.0 - 0.2 %   Neutrophils Relative % 36 %   Neutro Abs 0.8 (L) 1.7 - 7.7 K/uL   Lymphocytes Relative 55 %   Lymphs Abs 1.2 0.7 - 4.0 K/uL   Monocytes Relative 7 %   Monocytes Absolute 0.2 0.1 - 1.0 K/uL   Eosinophils Relative 1 %   Eosinophils Absolute 0.0 0.0 - 0.5 K/uL   Basophils Relative 1 %   Basophils Absolute 0.0 0.0 - 0.1 K/uL   Smear Review MORPHOLOGY UNREMARKABLE    Immature Granulocytes 0 %   Abs Immature Granulocytes 0.00 0.00 - 0.07 K/uL    Comment: Performed at [facility], [place] [address] [place], [place] [address]  Protime-INR     Status: Abnormal   Collection Time: 04/01/19  5:41 PM  Result Value Ref Range   Prothrombin Time 15.4 (H) 11.4 - 15.2 seconds   INR 1.2 0.8 - 1.2    Comment: (NOTE)  INR goal varies based on device and disease states. Performed at [facility], [place] [address] [place], [place] [address]   I-Stat beta hCG blood, ED     Status: None   Collection Time: 04/01/19  5:55 PM  Result Value Ref Range   I-stat hCG, quantitative <5.0 <5 mIU/mL   Comment 3            Comment:   GEST. AGE      CONC.  (mIU/mL)   <=1 WEEK        5 - 50     2 WEEKS       50 - 500     3 WEEKS       100 - 10,000     4 WEEKS     1,000 -  30,000        FEMALE AND NON-PREGNANT FEMALE:     LESS THAN 5 mIU/mL   Type and screen Ordered by PROVIDER DEFAULT     Status: None   Collection Time: 04/01/19  5:58 PM  Result Value Ref Range   ABO/RH(D) A POS    Antibody Screen NEG    Sample Expiration      04/04/2019,2359 Performed at [facility], [place] [address] [place], [place] [address]    Unit Number [id number]    Blood Component Type RED CELLS,LR    Unit division 00    Status of Unit REL FROM [facility]    Unit tag comment EMERGENCY RELEASE    Transfusion Status OK TO TRANSFUSE    Crossmatch Result NOT NEEDED    Unit Number [id number]    Blood Component Type RED CELLS,LR    Unit division 00    Status of Unit REL FROM [facility]    Unit tag comment EMERGENCY RELEASE    Transfusion Status OK TO TRANSFUSE    Crossmatch Result NOT NEEDED   Prepare fresh frozen plasma     Status: None   Collection Time: 04/01/19  5:58 PM  Result Value Ref Range   Unit Number [id number]    Blood Component Type LIQ PLASMA    Unit division 00    Status of Unit REL FROM [facility]    Unit tag comment EMERGENCY RELEASE    Transfusion Status OK TO TRANSFUSE    Unit Number [id number]    Blood Component Type LIQ PLASMA    Unit division 00    Status of Unit REL FROM [facility]    Unit tag comment EMERGENCY RELEASE    Transfusion Status OK TO TRANSFUSE   CBG monitoring, ED     Status: Abnormal   Collection Time: 04/01/19  6:13 PM  Result Value Ref Range   Glucose-Capillary 68 (L) 70 - 99 mg/dL  Acetaminophen level     Status: Abnormal   Collection Time: 04/01/19  6:34 PM  Result Value Ref Range   Acetaminophen (Tylenol), Serum <10 (L) 10 - 30 ug/mL    Comment: Performed at [facility], [address], [place], [place] [address]  Salicylate level     Status: None   Collection Time: 04/01/19  6:34 PM  Result Value Ref Range   Salicylate Lvl <7.5 2.8 - 30.0 mg/dL    Comment: Performed at [facility] [address] [place] [address],  [place], [place] [address]  Urinalysis, Routine w reflex microscopic     Status: Abnormal   Collection Time: 04/01/19  7:37 PM  Result Value Ref Range  Color, Urine STRAW (A) YELLOW   APPearance CLEAR CLEAR   Specific Gravity, Urine 1.002 (L) 1.005 - 1.030   pH 6.0 5.0 - 8.0   Glucose, UA NEGATIVE NEGATIVE mg/dL   Hgb urine dipstick SMALL (A) NEGATIVE   Bilirubin Urine NEGATIVE NEGATIVE   Ketones, ur NEGATIVE NEGATIVE mg/dL   Protein, ur NEGATIVE NEGATIVE mg/dL   Nitrite NEGATIVE NEGATIVE   Leukocytes,Ua NEGATIVE NEGATIVE   WBC, UA 0-5 0 - 5 WBC/hpf   Bacteria, UA RARE (A) NONE SEEN    Comment: Performed at [facility] [address] [place], [place] [address]  Urine rapid drug screen (hosp performed)     Status: Abnormal   Collection Time: 04/01/19  7:38 PM  Result Value Ref Range   Opiates NONE DETECTED NONE DETECTED   Cocaine NONE DETECTED NONE DETECTED   Benzodiazepines POSITIVE (A) NONE DETECTED   Amphetamines NONE DETECTED NONE DETECTED   Tetrahydrocannabinol NONE DETECTED NONE DETECTED   Barbiturates NONE DETECTED NONE DETECTED    Comment: (NOTE) DRUG SCREEN FOR MEDICAL PURPOSES ONLY.  IF CONFIRMATION IS NEEDED FOR ANY PURPOSE, NOTIFY LAB WITHIN 5 DAYS. LOWEST DETECTABLE LIMITS FOR URINE DRUG SCREEN Drug Class                     Cutoff (ng/mL) Amphetamine and metabolites    1000 Barbiturate and metabolites    200 Benzodiazepine                 263 Tricyclics and metabolites     300 Opiates and metabolites        300 Cocaine and metabolites        300 THC                            50 Performed at [facility], [place] [address], [place], [place] [address]   Lactic acid, plasma     Status: None   Collection Time: 04/01/19  8:30 PM  Result Value Ref Range   Lactic Acid, Venous 1.7 0.5 - 1.9 mmol/L    Comment: Performed at [facility] [address], [place], [place] [address]  CDS serology     Status: None   Collection Time: 04/01/19  8:30 PM   Result Value Ref Range   CDS serology specimen      SPECIMEN WILL BE HELD FOR 14 DAYS IF TESTING IS REQUIRED    Comment: Performed at [facility], [place] [address] [place], [place] [address]  ABO/Rh     Status: None   Collection Time: 04/01/19  8:33 PM  Result Value Ref Range   ABO/RH(D)      A POS Performed at [facility] [address], [place], [place] [address]   SARS Coronavirus 2 (CEPHEID - Performed in [facility] hospital lab), Hosp Order     Status: None   Collection Time: 04/01/19  8:55 PM  Result Value Ref Range   SARS Coronavirus 2 NEGATIVE NEGATIVE    Comment: (NOTE) If result is NEGATIVE SARS-CoV-2 target nucleic acids are NOT DETECTED. The SARS-CoV-2 RNA is generally detectable in upper and lower  respiratory specimens during the acute phase of infection. The lowest  concentration of SARS-CoV-2 viral copies this assay can detect is 250  copies / mL. A negative result does not preclude SARS-CoV-2 infection  and should not be used as the sole  basis for treatment or other  patient management decisions.  A negative result may occur with  improper specimen collection / handling, submission of specimen other  than nasopharyngeal swab, presence of viral mutation(s) within the  areas targeted by this assay, and inadequate number of viral copies  (<250 copies / mL). A negative result must be combined with clinical  observations, patient history, and epidemiological information. If result is POSITIVE SARS-CoV-2 target nucleic acids are DETECTED. The SARS-CoV-2 RNA is generally detectable in upper and lower  respiratory specimens dur ing the acute phase of infection.  Positive  results are indicative of active infection with SARS-CoV-2.  Clinical  correlation with patient history and other diagnostic information is  necessary to determine patient infection status.  Positive results do  not rule out bacterial infection or co-infection with other viruses. If  result is PRESUMPTIVE POSTIVE SARS-CoV-2 nucleic acids MAY BE PRESENT.   A presumptive positive result was obtained on the submitted specimen  and confirmed on repeat testing.  While 2019 novel coronavirus  (SARS-CoV-2) nucleic acids may be present in the submitted sample  additional confirmatory testing may be necessary for epidemiological  and / or clinical management purposes  to differentiate between  SARS-CoV-2 and other Sarbecovirus currently known to infect humans.  If clinically indicated additional testing with an alternate test  methodology [phone]) is advised. The SARS-CoV-2 RNA is generally  detectable in upper and lower respiratory sp ecimens during the acute  phase of infection. The expected result is Negative. Fact Sheet for Patients:  StrictlyIdeas.no Fact Sheet for Healthcare Providers: BankingDealers.co.za This test is not yet approved or cleared by the [place] FDA and has been authorized for detection and/or diagnosis of SARS-CoV-2 by FDA under an Emergency Use Authorization (EUA).  This EUA will remain in effect (meaning this test can be used) for the duration of the COVID-19 declaration under Section 564(b)(1) of the Act, 21 U.S.C. section 360bbb-3(b)(1), unless the authorization is terminated or revoked sooner. Performed at [facility], [place] [address], [place], [place] [address]   Magnesium     Status: None   Collection Time: 04/01/19  9:20 PM  Result Value Ref Range   Magnesium 2.0 1.7 - 2.4 mg/dL    Comment: Performed at [facility], [place] [address], [place], [place] [address]  Glucose, capillary     Status: None   Collection Time: 04/01/19 11:33 PM  Result Value Ref Range   Glucose-Capillary 80 70 - 99 mg/dL   Comment 1 Notify RN    Comment 2 Document in Chart   MRSA PCR Screening     Status: Abnormal   Collection Time: 04/01/19 11:36 PM  Result Value Ref Range   MRSA by PCR POSITIVE  (A) NEGATIVE    Comment:        The GeneXpert MRSA Assay (FDA approved for NASAL specimens only), is one component of a comprehensive MRSA colonization surveillance program. It is not intended to diagnose MRSA infection nor to guide or monitor treatment for MRSA infections. RESULT CALLED TO, READ BACK BY AND VERIFIED WITH: MILLER,J RN [id number] 04/02/2019 MITCHELL,L Performed at [facility], [place] [address], [place], [place] [address]   Ammonia     Status: Abnormal   Collection Time: 04/02/19 12:17 AM  Result Value Ref Range   Ammonia 54 (H) 9 - 35 umol/L    Comment: Performed at [facility], [place] [address], [place],  [address]  TSH  Status: None   Collection Time: 04/02/19 12:17 AM  Result Value Ref Range   TSH 1.639 0.350 - 4.500 uIU/mL    Comment: Performed by a 3rd Generation assay with a functional sensitivity of <=0.01 uIU/mL. Performed at [facility], [place] [address] [place], [place] [address]   CBC     Status: Abnormal   Collection Time: 04/02/19 12:17 AM  Result Value Ref Range   WBC 2.1 (L) 4.0 - 10.5 K/uL   RBC 3.45 (L) 3.87 - 5.11 MIL/uL   Hemoglobin 9.4 (L) 12.0 - 15.0 g/dL   HCT 30.1 (L) 36.0 - 46.0 %   MCV 87.2 80.0 - 100.0 fL   MCH 27.2 26.0 - 34.0 pg   MCHC 31.2 30.0 - 36.0 g/dL   RDW 17.7 (H) 11.5 - 15.5 %   Platelets 77 (L) 150 - 400 K/uL    Comment: REPEATED TO VERIFY Immature Platelet Fraction may be clinically indicated, consider ordering this additional test [id number] CONSISTENT WITH PREVIOUS RESULT    nRBC 0.0 0.0 - 0.2 %    Comment: Performed at [facility], [place] [address], [place], [place] [address]  APTT     Status: Abnormal   Collection Time: 04/02/19 12:17 AM  Result Value Ref Range   aPTT 39 (H) 24 - 36 seconds    Comment:        IF BASELINE aPTT IS ELEVATED, SUGGEST PATIENT RISK ASSESSMENT BE USED TO DETERMINE APPROPRIATE ANTICOAGULANT THERAPY. Performed at [facility], [place] [address],  [place], [place] [address]   HIV antibody (Routine Testing)     Status: None   Collection Time: 04/02/19 12:17 AM  Result Value Ref Range   HIV Screen 4th Generation wRfx Non Reactive Non Reactive    Comment: (NOTE) Performed At: [facility] [address] [place], [place] [id number] Rush Farmer MD QI:[id number]   Pregnancy, urine     Status: None   Collection Time: 04/02/19 12:32 AM  Result Value Ref Range   Preg Test, Ur NEGATIVE NEGATIVE    Comment:        THE SENSITIVITY OF THIS METHODOLOGY IS >20 mIU/mL. Performed at [facility], [place] [address] [place], [place] [address]   CBC     Status: Abnormal   Collection Time: 04/02/19  3:07 AM  Result Value Ref Range   WBC 2.1 (L) 4.0 - 10.5 K/uL   RBC 3.10 (L) 3.87 - 5.11 MIL/uL   Hemoglobin 8.6 (L) 12.0 - 15.0 g/dL   HCT 27.1 (L) 36.0 - 46.0 %   MCV 87.4 80.0 - 100.0 fL   MCH 27.7 26.0 - 34.0 pg   MCHC 31.7 30.0 - 36.0 g/dL   RDW 17.8 (H) 11.5 - 15.5 %   Platelets 72 (L) 150 - 400 K/uL    Comment: Immature Platelet Fraction may be clinically indicated, consider ordering this additional test [id number] CONSISTENT WITH PREVIOUS RESULT    nRBC 0.0 0.0 - 0.2 %    Comment: Performed at [facility], [place] [address] [place], [place] [address]  Basic metabolic panel     Status: Abnormal   Collection Time: 04/02/19  3:07 AM  Result Value Ref Range   Sodium 146 (H) 135 - 145 mmol/L   Potassium 3.6 3.5 - 5.1 mmol/L   Chloride 117 (H) 98 - 111 mmol/L   CO2 20 (L) 22 - 32 mmol/L   Glucose, Bld 80 70 - 99  mg/dL   BUN <5 (L) 6 - 20 mg/dL   Creatinine, Ser 0.47 0.44 - 1.00 mg/dL   Calcium 7.7 (L) 8.9 - 10.3 mg/dL   GFR calc non Af Amer >60 >60 mL/min   GFR calc Af Amer >60 >60 mL/min   Anion gap 9 5 - 15    Comment: Performed at [facility] [address], [place], [place] [address]  Glucose, capillary     Status: None   Collection Time: 04/02/19  3:31 AM  Result Value Ref Range   Glucose-Capillary 87  70 - 99 mg/dL  Cortisol-am, blood     Status: Abnormal   Collection Time: 04/02/19  4:30 AM  Result Value Ref Range   Cortisol - AM 1.2 (L) 6.7 - 22.6 ug/dL    Comment: Performed at [facility] [address] [place], [place] [address]  Glucose, capillary     Status: None   Collection Time: 04/02/19  8:08 AM  Result Value Ref Range   Glucose-Capillary 79 70 - 99 mg/dL   Comment 1 Notify RN    Comment 2 Document in Chart   CBC     Status: Abnormal   Collection Time: 04/02/19  9:01 AM  Result Value Ref Range   WBC 1.7 (L) 4.0 - 10.5 K/uL   RBC 3.18 (L) 3.87 - 5.11 MIL/uL   Hemoglobin 8.9 (L) 12.0 - 15.0 g/dL   HCT 27.8 (L) 36.0 - 46.0 %   MCV 87.4 80.0 - 100.0 fL   MCH 28.0 26.0 - 34.0 pg   MCHC 32.0 30.0 - 36.0 g/dL   RDW 17.8 (H) 11.5 - 15.5 %   Platelets 66 (L) 150 - 400 K/uL    Comment: REPEATED TO VERIFY Immature Platelet Fraction may be clinically indicated, consider ordering this additional test [id number] CONSISTENT WITH PREVIOUS RESULT    nRBC 0.0 0.0 - 0.2 %    Comment: Performed at [facility], [place] [address], [place], [place] [address]  Provider-confirm verbal Blood Bank order - RBC, FFP, Type & Screen; 2 Units; Order taken: 04/01/2019; 5:55 PM; Emergency Release, Level 1 Trauma Emergency released rbc's and FFPs were not transfused and were returned to blood bank.     Status: None   Collection Time: 04/02/19 10:42 AM  Result Value Ref Range   Blood product order confirm      MD AUTHORIZATION REQUESTED Performed at [facility] [address] [place], [place] [address]   Glucose, capillary     Status: None   Collection Time: 04/02/19 11:42 AM  Result Value Ref Range   Glucose-Capillary 75 70 - 99 mg/dL   Comment 1 Notify RN    Comment 2 Document in Chart     Current Facility-Administered Medications  Medication Dose Route Frequency Provider Last Rate Last Dose  . acetaminophen (TYLENOL) tablet 650 mg  650 mg Oral Q6H PRN Ivor Costa, MD    650 mg at 04/02/19 0012   Or  . acetaminophen (TYLENOL) suppository 650 mg  650 mg Rectal Q6H PRN Ivor Costa, MD      . Chlorhexidine Gluconate Cloth 2 % PADS 6 each  6 each Topical Q0600 Ivor Costa, MD   6 each at 04/02/19 0503  . dextrose 50 % solution 50 mL  50 mL Intravenous PRN Ivor Costa, MD      . escitalopram (LEXAPRO) tablet 10 mg  10 mg Oral Gaye Alken, MD  10 mg at 04/02/19 0626  . folic acid (FOLVITE) tablet 1 mg  1 mg Oral Daily Ivor Costa, MD   1 mg at 04/02/19 1610  . lactated ringers infusion   Intravenous Continuous Aline August, MD 75 mL/hr at 04/02/19 1300    . lactulose (CHRONULAC) 10 GM/15ML solution 10 g  10 g Oral TID Ivor Costa, MD   10 g at 04/02/19 0927  . LORazepam (ATIVAN) injection 0-4 mg  0-4 mg Intravenous Q6H Ivor Costa, MD   1 mg at 04/02/19 0846   Followed by  . [START ON 04/03/2019] LORazepam (ATIVAN) injection 0-4 mg  0-4 mg Intravenous Q12H Ivor Costa, MD      . LORazepam (ATIVAN) tablet 1 mg  1 mg Oral Q6H PRN Ivor Costa, MD   1 mg at 04/01/19 2352   Or  . LORazepam (ATIVAN) injection 1 mg  1 mg Intravenous Q6H PRN Ivor Costa, MD      . LORazepam (ATIVAN) injection 2-3 mg  2-3 mg Intravenous Q1H PRN Ivor Costa, MD      . multivitamin with minerals tablet 1 tablet  1 tablet Oral Daily Ivor Costa, MD   1 tablet at 04/02/19 [phone]  . mupirocin ointment (BACTROBAN) 2 % 1 application  1 application Nasal BID Ivor Costa, MD   1 application at 54/09/81 0947  . nicotine (NICODERM CQ - dosed in mg/24 hours) patch 21 mg  21 mg Transdermal Daily Ivor Costa, MD   21 mg at 04/02/19 0927  . ondansetron (ZOFRAN) tablet 4 mg  4 mg Oral Q6H PRN Ivor Costa, MD       Or  . ondansetron [facility]) injection 4 mg  4 mg Intravenous Q6H PRN Ivor Costa, MD      . pantoprazole (PROTONIX) EC tablet 40 mg  40 mg Oral BID Ivor Costa, MD   40 mg at 04/02/19 1914  . thiamine (VITAMIN B-1) tablet 100 mg  100 mg Oral Daily Ivor Costa, MD   100 mg at 04/02/19 7829  . topiramate  (TOPAMAX) tablet 25 mg  25 mg Oral BID Ivor Costa, MD   25 mg at 04/02/19 5621    Musculoskeletal: Strength & Muscle Tone: within normal limits Gait & Station: UTA since patient is lying in bed. Patient leans: N/A  Psychiatric Specialty Exam: Physical Exam  Nursing note and vitals reviewed. Constitutional: She is oriented to person, place, and time. She appears well-developed and well-nourished.  HENT:  Head: Normocephalic.  Knot on the right side of her forehead.  Neck: Normal range of motion.  Respiratory: Effort normal.  Musculoskeletal: Normal range of motion.  Neurological: She is alert and oriented to person, place, and time.  Psychiatric: Her speech is normal and behavior is normal. Judgment and thought content normal. Cognition and memory are normal. She exhibits a depressed mood.    Review of Systems  Cardiovascular: Negative for chest pain.  Gastrointestinal: Negative for abdominal pain, constipation, diarrhea, nausea and vomiting.  Psychiatric/Behavioral: Positive for depression and substance abuse. Negative for hallucinations and suicidal ideas. The patient does not have insomnia.   All other systems reviewed and are negative.   Blood pressure 115/76, pulse 75, temperature 98.1 F (36.7 C), temperature source Oral, resp. rate 16, height 5\' 3"  (1.6 m), weight 73.8 kg, last menstrual period 10/02/2018, SpO2 90 %.Body mass index is 28.82 kg/m.  General Appearance: Fairly Groomed, middle aged, Hispanic female, wearing a hospital gown with bleached hair who is lying in bed.  NAD.   Eye Contact:  Good  Speech:  Clear and Coherent and Normal Rate  Volume:  Normal  Mood:  "My mood is up and down."  Affect:  Constricted  Thought Process:  Goal Directed, Linear and Descriptions of Associations: Intact  Orientation:  Full (Time, Place, and Person)  Thought Content:  Logical  Suicidal Thoughts:  No  Homicidal Thoughts:  No  Memory:  Immediate;   Good Recent;   Good Remote;    Good  Judgement:  Fair  Insight:  Fair  Psychomotor Activity:  Normal  Concentration:  Concentration: Good and Attention Span: Good  Recall:  Good  Fund of Knowledge:  Good  Language:  Good  Akathisia:  No  Handed:  Right  AIMS (if indicated):   N/A  Assets:  Communication Skills Desire for Improvement Financial Resources/Insurance Housing Intimacy Physical Health Resilience Social Support  ADL's:  Intact  Cognition:  WNL  Sleep:   Fair   Assessment:  Klohe Lovering is a 43 y.o. female who was admitted with hypotension in setting of dehydration/alcohol use. She reports daily fluctuations in her mood that do not appear consistent with bipolar disorder. She denies SI, HI or AVH. She denies a history of suicide attempts. She is followed at [facility] and would like to defer medication management to her psychiatrist. Please have SW provide patient with resources for grief counseling. She does not warrant inpatient psychiatric hospitalization at this time.    Treatment Plan Summary: -Continue Lexapro 10 mg daily for mood. -Continue Topamax 25 mg BID for mood stabilization/alcohol use. -Continue Seroquel 50 mg qhs for insomnia/mood. -Patient has an appointment with her psychiatrist at [facility] on 5/22.  -Please have SW provide patient with resources for grief counseling.  -Patient declines substance abuse treatment resources. Discussed with her the option to attend virtual [place] meetings and she agrees that she will look into this option.  -Psychiatry will sign off on patient at this time. Please consult psychiatry again as needed.   Disposition: No evidence of imminent risk to self or others at present.   Patient does not meet criteria for psychiatric inpatient admission.  Faythe Dingwall, DO 04/02/2019 1:53 PM

## 2019-04-02 NOTE — Plan of Care (Signed)

## 2019-04-02 NOTE — Progress Notes (Addendum)
PT Cancellation/Discharge Note  Patient Details Name: Sara [name] MRN: [id number] DOB: 01-15-1976   Cancelled Treatment:    Reason Eval/Treat Not Completed: PT screened, no needs identified, will sign off.  Pt was able to walk around the room (a bit slower than usual, but independently), turn, and pick up objects off of the floor.  I educated her on going a bit slower and pausing with each transitional move (supine to sit, sit to stand) to ensure that she feels ok before proceeding to walk.  Pt has no acute or follow up PT needs at this time.   Thanks,  Barbarann Ehlers. Heywood Tokunaga, PT, DPT  Acute Rehabilitation [phone] pager [phone] office     [name] 04/02/2019, 2:53 PM

## 2019-04-02 NOTE — Progress Notes (Signed)
Patient ID: Sara [name], female   DOB: 01/19/1976, 43 y.o.   MRN: [id number]  PROGRESS NOTE    Sara [name]  WUJ:[id number] DOB: Jan 11, 1976 DOA: 04/01/2019 PCP: Patient, No Pcp Per   Brief Narrative:  43 year old female with history of alcoholic cirrhosis, portal vein hypertension, esophageal varices, pancytopenia, upper GI bleeding, pancreatitis, tobacco abuse, GERD, depression, anxiety, anemia presented with fall.  There was no loss of consciousness.  She apparently drank alcohol and took two 25 mg Seroquel.  She was initially hypotensive in the ED and was started on IV fluids.  COVID-19 test was negative.  Chest x-ray was negative for acute infiltrate.  X-ray of the pelvis was negative for fracture.CT of the head and C-spine was negative for any acute abnormality.  CT of the chest abdomen and pelvis showed no acute intrathoracic abnormality; right lower flank hematoma without evidence of active extravasation was seen along with cirrhosis with portal hypertension.  She was started on IV fluids.  PCCM was also consulted for hypotension.  Assessment & Plan:   Principal Problem:   Hypotension Active Problems:   Anxiety state   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HCC)   Depression   GERD (gastroesophageal reflux disease)   Pancytopenia (HCC)   ETOH abuse   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[place])   Fall   Hypokalemia   Nodule on liver   Right flank hematoma   Hypoglycemia  Hypotension -Unclear source.  Probably secondary to dehydration/alcohol use along with Seroquel use.  Patient's blood pressure probably is normally low -PCCM was also initially consulted and they recommended no need for vasopressor treatment. -She also had a right flank hematoma probably from the fall.  Hemoglobin has remained stable. -No evidence of any infection.  CT chest was negative for any acute abnormality.  No evidence of ascites on CT of the abdomen.  Monitoring off antibiotics -On IV fluids.  Blood pressure  improving.  Decrease Ringer lactate to 75 cc an hour -PT/OT evaluation -Transfer to telemetry  Right flank hematoma after a fall -Monitor.  Hemoglobin stable. -Fall precautions. -CT head and neck was negative for any acute issues.  Alcoholic cirrhosis of liver with esophageal varices and portal hypertension -Ammonia was 54 on admission. -Currently alert awake.  Continue lactulose. -Currently not on diuretics as an outpatient.  Will not start on diuretics because of hypotension and no evidence of ascites on CAT scan -Outpatient follow-up with GI  Alcohol abuse -Counseled regarding alcohol cessation. -Continue CIWA protocol.  Continue multivitamin, thiamine and folate.  Depression and anxiety -Patient took Seroquel along with alcohol.  May be her depression needs to be addressed further.  Will get psychiatry evaluation.  Seroquel on hold.  Continue Lexapro and Topamax.  Pancytopenia, chronic -Continue monitoring.  Follows up with Dr. Lindi Adie as an outpatient  Hypoglycemia -Continue CBG monitoring.  Will start diet.  DVT prophylaxis: SCDs.  Avoid Lovenox because of thrombocytopenia  code Status: Full Family Communication: None at bedside Disposition Plan: Home in 1 to 2 days if clinically improved  Consultants: None  Procedures: None  Antimicrobials: None   Subjective: Patient seen and examined at bedside.  She is awake and answers some questions.  Feels slightly better.  Poor historian.  No overnight fever or vomiting.  Objective: Vitals:   04/02/19 0314 04/02/19 0738 04/02/19 0820 04/02/19 1105  BP: (!) 89/55 99/66 109/72 111/80  Pulse: 75     Resp: 16     Temp: 98 F (36.7 C) 97.9 F (36.6 C)  98.1 F (36.7 C)  TempSrc: Oral Other (Comment)  Oral  SpO2: 90%     Weight:      Height:        Intake/Output Summary (Last 24 hours) at 04/02/2019 1228 Last data filed at 04/02/2019 0800 Gross per 24 hour  Intake 4353.63 ml  Output 1250 ml  Net 3103.63 ml   Filed  Weights   04/01/19 2008 04/01/19 2334  Weight: 68 kg 73.8 kg    Examination:  General exam: Appears calm and comfortable.  Looks older than stated age.  Poor historian.  No acute distress. Respiratory system: Bilateral decreased breath sounds at bases Cardiovascular system: S1 & S2 heard, Rate controlled Gastrointestinal system: Abdomen is nondistended, soft and nontender. Normal bowel sounds heard. Extremities: No cyanosis, clubbing, edema  Central nervous system: Alert and oriented. No focal neurological deficits. Moving extremities Skin: No rashes, lesions or ulcers Psychiatry: Flat affect.    Data Reviewed: I have personally reviewed following labs and imaging studies  CBC: Recent Labs  Lab 04/01/19 1740 04/02/19 0017 04/02/19 0307 04/02/19 0901  WBC 2.3* 2.1* 2.1* 1.7*  NEUTROABS 0.8*  --   --   --   HGB 9.5* 9.4* 8.6* 8.9*  HCT 31.3* 30.1* 27.1* 27.8*  MCV 88.7 87.2 87.4 87.4  PLT 93* 77* 72* 66*   Basic Metabolic Panel: Recent Labs  Lab 04/01/19 1740 04/01/19 2120 04/02/19 0307  NA 144  --  146*  K 3.4*  --  3.6  CL 115*  --  117*  CO2 19*  --  20*  GLUCOSE 90  --  80  BUN 5*  --  <5*  CREATININE 0.53  --  0.47  CALCIUM 8.1*  --  7.7*  MG  --  2.0  --    GFR: Estimated Creatinine Clearance: 89.1 mL/min (by C-G formula based on SCr of 0.47 mg/dL). Liver Function Tests: Recent Labs  Lab 04/01/19 1740  AST 55*  ALT 27  ALKPHOS 82  BILITOT 0.6  PROT 6.7  ALBUMIN 3.2*   No results for input(s): LIPASE, AMYLASE in the last 168 hours. Recent Labs  Lab 04/02/19 0017  AMMONIA 54*   Coagulation Profile: Recent Labs  Lab 04/01/19 1741  INR 1.2   Cardiac Enzymes: No results for input(s): CKTOTAL, CKMB, CKMBINDEX, TROPONINI in the last 168 hours. BNP (last 3 results) No results for input(s): PROBNP in the last 8760 hours. HbA1C: No results for input(s): HGBA1C in the last 72 hours. CBG: Recent Labs  Lab 04/01/19 1813 04/01/19 2333  04/02/19 0331 04/02/19 0808 04/02/19 1142  GLUCAP 68* 80 87 79 75   Lipid Profile: No results for input(s): CHOL, HDL, LDLCALC, TRIG, CHOLHDL, LDLDIRECT in the last 72 hours. Thyroid Function Tests: Recent Labs    04/02/19 0017  TSH 1.639   Anemia Panel: No results for input(s): VITAMINB12, FOLATE, FERRITIN, TIBC, IRON, RETICCTPCT in the last 72 hours. Sepsis Labs: Recent Labs  Lab 04/01/19 2030  LATICACIDVEN 1.7    Recent Results (from the past 240 hour(s))  SARS Coronavirus 2 (CEPHEID - Performed in [facility] hospital lab), Hosp Order     Status: None   Collection Time: 04/01/19  8:55 PM  Result Value Ref Range Status   SARS Coronavirus 2 NEGATIVE NEGATIVE Final    Comment: (NOTE) If result is NEGATIVE SARS-CoV-2 target nucleic acids are NOT DETECTED. The SARS-CoV-2 RNA is generally detectable in upper and lower  respiratory specimens during the acute phase of  infection. The lowest  concentration of SARS-CoV-2 viral copies this assay can detect is 250  copies / mL. A negative result does not preclude SARS-CoV-2 infection  and should not be used as the sole basis for treatment or other  patient management decisions.  A negative result may occur with  improper specimen collection / handling, submission of specimen other  than nasopharyngeal swab, presence of viral mutation(s) within the  areas targeted by this assay, and inadequate number of viral copies  (<250 copies / mL). A negative result must be combined with clinical  observations, patient history, and epidemiological information. If result is POSITIVE SARS-CoV-2 target nucleic acids are DETECTED. The SARS-CoV-2 RNA is generally detectable in upper and lower  respiratory specimens dur ing the acute phase of infection.  Positive  results are indicative of active infection with SARS-CoV-2.  Clinical  correlation with patient history and other diagnostic information is  necessary to determine patient infection  status.  Positive results do  not rule out bacterial infection or co-infection with other viruses. If result is PRESUMPTIVE POSTIVE SARS-CoV-2 nucleic acids MAY BE PRESENT.   A presumptive positive result was obtained on the submitted specimen  and confirmed on repeat testing.  While 2019 novel coronavirus  (SARS-CoV-2) nucleic acids may be present in the submitted sample  additional confirmatory testing may be necessary for epidemiological  and / or clinical management purposes  to differentiate between  SARS-CoV-2 and other Sarbecovirus currently known to infect humans.  If clinically indicated additional testing with an alternate test  methodology [phone]) is advised. The SARS-CoV-2 RNA is generally  detectable in upper and lower respiratory sp ecimens during the acute  phase of infection. The expected result is Negative. Fact Sheet for Patients:  StrictlyIdeas.no Fact Sheet for Healthcare Providers: BankingDealers.co.za This test is not yet approved or cleared by the [place] FDA and has been authorized for detection and/or diagnosis of SARS-CoV-2 by FDA under an Emergency Use Authorization (EUA).  This EUA will remain in effect (meaning this test can be used) for the duration of the COVID-19 declaration under Section 564(b)(1) of the Act, 21 U.S.C. section 360bbb-3(b)(1), unless the authorization is terminated or revoked sooner. Performed at [facility], [place] [address], [place], [place] [address]   MRSA PCR Screening     Status: Abnormal   Collection Time: 04/01/19 11:36 PM  Result Value Ref Range Status   MRSA by PCR POSITIVE (A) NEGATIVE Final    Comment:        The GeneXpert MRSA Assay (FDA approved for NASAL specimens only), is one component of a comprehensive MRSA colonization surveillance program. It is not intended to diagnose MRSA infection nor to guide or monitor treatment for MRSA infections. RESULT  CALLED TO, READ BACK BY AND VERIFIED WITH: MILLER,J RN [id number] 04/02/2019 MITCHELL,L Performed at [facility], [place] [address] [place], [place] [address]          Radiology Studies: Ct Head Wo Contrast  Result Date: 04/01/2019 CLINICAL DATA:  Fall down stairs EXAM: CT HEAD WITHOUT CONTRAST CT CERVICAL SPINE WITHOUT CONTRAST TECHNIQUE: Multidetector CT imaging of the head and cervical spine was performed following the standard protocol without intravenous contrast. Multiplanar CT image reconstructions of the cervical spine were also generated. COMPARISON:  Head CT 02/25/2018, 12/19/2017 FINDINGS: CT HEAD FINDINGS Brain: There is no mass, hemorrhage or extra-axial collection. The size and configuration of the ventricles and extra-axial CSF spaces are normal. The brain parenchyma is  normal, without evidence of acute or chronic infarction. There are multiple scattered parenchymal calcifications, unchanged. Vascular: No abnormal hyperdensity of the major intracranial arteries or dural venous sinuses. No intracranial atherosclerosis. Skull: The visualized skull base, calvarium and extracranial soft tissues are normal. Sinuses/Orbits: No fluid levels or advanced mucosal thickening of the visualized paranasal sinuses. No mastoid or middle ear effusion. The orbits are normal. CT CERVICAL SPINE FINDINGS Alignment: No static subluxation. Facets are aligned. Occipital condyles are normally positioned. Skull base and vertebrae: No acute fracture. Soft tissues and spinal canal: No prevertebral fluid or swelling. No visible canal hematoma. Disc levels: No advanced spinal canal or neural foraminal stenosis. Upper chest: No pneumothorax, pulmonary nodule or pleural effusion. Other: Normal visualized paraspinal cervical soft tissues. IMPRESSION: 1. No acute abnormality of the head or cervical spine. 2. 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 Electronically Signed    By: Ulyses Jarred M.D.   On: 04/01/2019 19:04   Ct Chest W Contrast  Result Date: 04/01/2019 CLINICAL DATA:  Acute pain due to trauma EXAM: CT CHEST AND ABDOMEN WITH CONTRAST TECHNIQUE: Multidetector CT imaging of the chest and abdomen was performed following the standard protocol during bolus administration of intravenous contrast. CONTRAST:  149mL OMNIPAQUE IOHEXOL 300 MG/ML  SOLN COMPARISON:  CT dated 03/02 2019 CT abdomen pelvis dated 08/30/2018. FINDINGS: CT CHEST FINDINGS Cardiovascular: No significant vascular findings. Normal heart size. No pericardial effusion. Mediastinum/Nodes: No enlarged mediastinal, hilar, or axillary lymph nodes. Thyroid gland, trachea, and esophagus demonstrate no significant findings. Lungs/Pleura: Lungs are clear. No pleural effusion or pneumothorax. Musculoskeletal: No chest wall mass or suspicious bone lesions identified. CT ABDOMEN FINDINGS Hepatobiliary: There is cirrhosis with sequela of portal hypertension including recanalization of the umbilical vein. There is a 1 cm hypoattenuating nodule in hepatic segment 3 (axial series 3, image 72). This does not definitively identify washout on the delayed phase. This lesion is present on prior CT and appears stable. This same lesion is not readily identified on CT from 2016. The patient is status post prior cholecystectomy. There is moderate dilatation of the common bile duct which is similar to prior studies. Pancreas: Unremarkable. No pancreatic ductal dilatation or surrounding inflammatory changes. Spleen: The spleen is significantly enlarged. There is some portosystemic shunting. Adrenals/Urinary Tract: Adrenal glands are unremarkable. Kidneys are normal, without renal calculi, focal lesion, or hydronephrosis. The bladder is moderately distended. Stomach/Bowel: Stomach is within normal limits. Appendix appears normal. No evidence of bowel wall thickening, distention, or inflammatory changes. Vascular/Lymphatic: No  significant atherosclerotic changes. No abdominal aortic aneurysm. Again identified are findings of portal hypertension. Other: There is a right flank hematoma without evidence of active extravasation. Reproductive: The uterus is unremarkable.  There is no adnexal mass. Musculoskeletal: No acute or significant osseous findings. IMPRESSION: 1. No acute intrathoracic abnormality detected. 2. Right lower flank hematoma without evidence of active extravasation. 3. No acute osseous abnormality detected. 4. Cirrhosis with sequela of portal hypertension. There is a small 1 cm hyperattenuating nodule in hepatic segment 3 as detailed above. This is incompletely characterized on this exam. Outpatient follow-up with a liver mass protocol CT or MRI is recommended for further evaluation of this finding. 5. Moderately distended urinary bladder. Electronically Signed   By: Constance Holster M.D.   On: 04/01/2019 19:20   Ct Cervical Spine Wo Contrast  Result Date: 04/01/2019 CLINICAL DATA:  Fall down stairs EXAM: CT HEAD WITHOUT CONTRAST CT CERVICAL SPINE WITHOUT CONTRAST TECHNIQUE: Multidetector CT imaging of  the head and cervical spine was performed following the standard protocol without intravenous contrast. Multiplanar CT image reconstructions of the cervical spine were also generated. COMPARISON:  Head CT 02/25/2018, 12/19/2017 FINDINGS: CT HEAD FINDINGS Brain: There is no mass, hemorrhage or extra-axial collection. The size and configuration of the ventricles and extra-axial CSF spaces are normal. The brain parenchyma is normal, without evidence of acute or chronic infarction. There are multiple scattered parenchymal calcifications, unchanged. Vascular: No abnormal hyperdensity of the major intracranial arteries or dural venous sinuses. No intracranial atherosclerosis. Skull: The visualized skull base, calvarium and extracranial soft tissues are normal. Sinuses/Orbits: No fluid levels or advanced mucosal thickening of  the visualized paranasal sinuses. No mastoid or middle ear effusion. The orbits are normal. CT CERVICAL SPINE FINDINGS Alignment: No static subluxation. Facets are aligned. Occipital condyles are normally positioned. Skull base and vertebrae: No acute fracture. Soft tissues and spinal canal: No prevertebral fluid or swelling. No visible canal hematoma. Disc levels: No advanced spinal canal or neural foraminal stenosis. Upper chest: No pneumothorax, pulmonary nodule or pleural effusion. Other: Normal visualized paraspinal cervical soft tissues. IMPRESSION: 1. No acute abnormality of the head or cervical spine. 2. 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 Electronically Signed   By: Ulyses Jarred M.D.   On: 04/01/2019 19:04   Ct Abdomen Pelvis W Contrast  Result Date: 04/01/2019 CLINICAL DATA:  Acute pain due to trauma EXAM: CT CHEST AND ABDOMEN WITH CONTRAST TECHNIQUE: Multidetector CT imaging of the chest and abdomen was performed following the standard protocol during bolus administration of intravenous contrast. CONTRAST:  157mL OMNIPAQUE IOHEXOL 300 MG/ML  SOLN COMPARISON:  CT dated 03/02 2019 CT abdomen pelvis dated 08/30/2018. FINDINGS: CT CHEST FINDINGS Cardiovascular: No significant vascular findings. Normal heart size. No pericardial effusion. Mediastinum/Nodes: No enlarged mediastinal, hilar, or axillary lymph nodes. Thyroid gland, trachea, and esophagus demonstrate no significant findings. Lungs/Pleura: Lungs are clear. No pleural effusion or pneumothorax. Musculoskeletal: No chest wall mass or suspicious bone lesions identified. CT ABDOMEN FINDINGS Hepatobiliary: There is cirrhosis with sequela of portal hypertension including recanalization of the umbilical vein. There is a 1 cm hypoattenuating nodule in hepatic segment 3 (axial series 3, image 72). This does not definitively identify washout on the delayed phase. This lesion is present on prior CT  and appears stable. This same lesion is not readily identified on CT from 2016. The patient is status post prior cholecystectomy. There is moderate dilatation of the common bile duct which is similar to prior studies. Pancreas: Unremarkable. No pancreatic ductal dilatation or surrounding inflammatory changes. Spleen: The spleen is significantly enlarged. There is some portosystemic shunting. Adrenals/Urinary Tract: Adrenal glands are unremarkable. Kidneys are normal, without renal calculi, focal lesion, or hydronephrosis. The bladder is moderately distended. Stomach/Bowel: Stomach is within normal limits. Appendix appears normal. No evidence of bowel wall thickening, distention, or inflammatory changes. Vascular/Lymphatic: No significant atherosclerotic changes. No abdominal aortic aneurysm. Again identified are findings of portal hypertension. Other: There is a right flank hematoma without evidence of active extravasation. Reproductive: The uterus is unremarkable.  There is no adnexal mass. Musculoskeletal: No acute or significant osseous findings. IMPRESSION: 1. No acute intrathoracic abnormality detected. 2. Right lower flank hematoma without evidence of active extravasation. 3. No acute osseous abnormality detected. 4. Cirrhosis with sequela of portal hypertension. There is a small 1 cm hyperattenuating nodule in hepatic segment 3 as detailed above. This is incompletely characterized on this exam. Outpatient follow-up with a liver mass protocol  CT or MRI is recommended for further evaluation of this finding. 5. Moderately distended urinary bladder. Electronically Signed   By: Constance Holster M.D.   On: 04/01/2019 19:20   Dg Pelvis Portable  Result Date: 04/01/2019 CLINICAL DATA:  Status post fall down 16 stairs today. Pain. Initial encounter. EXAM: PORTABLE PELVIS 1-2 VIEWS COMPARISON:  CT abdomen and pelvis 08/30/2018. FINDINGS: There is no evidence of pelvic fracture or diastasis. No pelvic bone lesions  are seen. IMPRESSION: Normal exam. Electronically Signed   By: Inge Rise M.D.   On: 04/01/2019 18:23   Dg Chest Portable 1 View  Result Date: 04/01/2019 CLINICAL DATA:  Fall. EXAM: PORTABLE CHEST 1 VIEW COMPARISON:  Chest x-ray dated March 18, 2019. FINDINGS: The patient is rotated to the left. The heart size and mediastinal contours are within normal limits. Both lungs are clear. The visualized skeletal structures are unremarkable. IMPRESSION: No active disease. Electronically Signed   By: Titus Dubin M.D.   On: 04/01/2019 18:06        Scheduled Meds:  Chlorhexidine Gluconate Cloth  6 each Topical Q0600   escitalopram  10 mg Oral [id number]   folic acid  1 mg Oral Daily   lactulose  10 g Oral TID   LORazepam  0-4 mg Intravenous Q6H   Followed by   Derrill Memo ON 04/03/2019] LORazepam  0-4 mg Intravenous Q12H   multivitamin with minerals  1 tablet Oral Daily   mupirocin ointment  1 application Nasal BID   nicotine  21 mg Transdermal Daily   pantoprazole  40 mg Oral BID   thiamine  100 mg Oral Daily   topiramate  25 mg Oral BID   Continuous Infusions:  lactated ringers 125 mL/hr at 04/02/19 0945     LOS: 0 days        Aline August, MD [facility] 04/02/2019, 12:28 PM

## 2019-04-03 DIAGNOSIS — F1020 Alcohol dependence, uncomplicated: Secondary | ICD-10-CM

## 2019-04-03 LAB — GLUCOSE, CAPILLARY
Glucose-Capillary: 86 mg/dL (ref 70–99)
Glucose-Capillary: 107 mg/dL — ABNORMAL HIGH (ref 70–99)

## 2019-04-03 LAB — CBC WITH DIFFERENTIAL/PLATELET
Abs Immature Granulocytes: 0.01 10*3/uL (ref 0.00–0.07)
Basophils Absolute: 0 10*3/uL (ref 0.0–0.1)
Basophils Relative: 1 %
Eosinophils Absolute: 0 10*3/uL (ref 0.0–0.5)
Eosinophils Relative: 2 %
HCT: 26.7 % — ABNORMAL LOW (ref 36.0–46.0)
Hemoglobin: 8.6 g/dL — ABNORMAL LOW (ref 12.0–15.0)
Immature Granulocytes: 1 %
Lymphocytes Relative: 40 %
Lymphs Abs: 0.8 10*3/uL (ref 0.7–4.0)
MCH: 27.9 pg (ref 26.0–34.0)
MCHC: 32.2 g/dL (ref 30.0–36.0)
MCV: 86.7 fL (ref 80.0–100.0)
Monocytes Absolute: 0.2 10*3/uL (ref 0.1–1.0)
Monocytes Relative: 10 %
Neutro Abs: 1 10*3/uL — ABNORMAL LOW (ref 1.7–7.7)
Neutrophils Relative %: 46 %
Platelets: 61 10*3/uL — ABNORMAL LOW (ref 150–400)
RBC: 3.08 MIL/uL — ABNORMAL LOW (ref 3.87–5.11)
RDW: 17.3 % — ABNORMAL HIGH (ref 11.5–15.5)
Smear Review: DECREASED
WBC: 2 10*3/uL — ABNORMAL LOW (ref 4.0–10.5)
nRBC: 1 % — ABNORMAL HIGH (ref 0.0–0.2)

## 2019-04-03 LAB — COMPREHENSIVE METABOLIC PANEL
ALT: 23 U/L (ref 0–44)
AST: 40 U/L (ref 15–41)
Albumin: 2.7 g/dL — ABNORMAL LOW (ref 3.5–5.0)
Alkaline Phosphatase: 68 U/L (ref 38–126)
Anion gap: 6 (ref 5–15)
BUN: <5 mg/dL — ABNORMAL LOW (ref 6–20)
CO2: 20 mmol/L — ABNORMAL LOW (ref 22–32)
Calcium: 8.2 mg/dL — ABNORMAL LOW (ref 8.9–10.3)
Chloride: 112 mmol/L — ABNORMAL HIGH (ref 98–111)
Creatinine, Ser: 0.61 mg/dL (ref 0.44–1.00)
GFR calc Af Amer: >60 mL/min (ref >60)
GFR calc non Af Amer: >60 mL/min (ref >60)
Glucose, Bld: 87 mg/dL (ref 70–99)
Potassium: 3.3 mmol/L — ABNORMAL LOW (ref 3.5–5.1)
Sodium: 138 mmol/L (ref 135–145)
Total Bilirubin: 1.6 mg/dL — ABNORMAL HIGH (ref 0.3–1.2)
Total Protein: 6 g/dL — ABNORMAL LOW (ref 6.5–8.1)

## 2019-04-03 LAB — MAGNESIUM: Magnesium: 1.7 mg/dL (ref 1.7–2.4)

## 2019-04-03 MED ORDER — CHLORDIAZEPOXIDE HCL 5 MG PO CAPS: ORAL_CAPSULE | ORAL | 0 refills | Status: DC

## 2019-04-03 MED ORDER — POTASSIUM CHLORIDE CRYS ER 20 MEQ PO TBCR
40.0000 meq | EXTENDED_RELEASE_TABLET | Freq: Once | ORAL | Status: AC
  Administered 2019-04-03: 13:00:00 40 meq via ORAL
  Filled 2019-04-03: qty 2

## 2019-04-03 MED ORDER — LACTATED RINGERS IV SOLN: INTRAVENOUS | Status: AC

## 2019-04-03 MED ORDER — CHLORDIAZEPOXIDE HCL 5 MG PO CAPS: 15.0000 mg | ORAL_CAPSULE | Freq: Three times a day (TID) | ORAL | Status: DC

## 2019-04-03 MED ORDER — ESCITALOPRAM OXALATE 10 MG PO TABS: 10.0000 mg | ORAL_TABLET | Freq: Every day | ORAL | 2 refills | Status: DC

## 2019-04-03 MED ORDER — CHLORDIAZEPOXIDE HCL 5 MG PO CAPS
15.0000 mg | ORAL_CAPSULE | Freq: Three times a day (TID) | ORAL | Status: DC
  Administered 2019-04-03: 15 mg via ORAL
  Filled 2019-04-03 (×2): qty 3

## 2019-04-03 MED ORDER — MAGNESIUM SULFATE IN D5W 1-5 GM/100ML-% IV SOLN
1.0000 g | Freq: Once | INTRAVENOUS | Status: AC
  Administered 2019-04-03: 1 g via INTRAVENOUS
  Filled 2019-04-03: qty 100

## 2019-04-03 NOTE — Discharge Summary (Signed)
Sara [name] LEX:[id number] DOB: 1976/02/10 DOA: 04/01/2019  PCP: Patient, No Pcp Per  Admit date: 04/01/2019  Discharge date: 04/03/2019  Admitted From: Home  Disposition:  Home   Recommendations for Outpatient Follow-up:   Follow up with PCP in 1-2 weeks  PCP Please obtain BMP/CBC, 2 view CXR in 1week,  (see Discharge instructions)   PCP Please follow up on the following pending results:    Home Health: Refusing HHPT and walker   Equipment/Devices: refused  Consultations: Psych Discharge Condition: Stable   CODE STATUS: Full   Diet Recommendation: Heart Healthy     Chief Complaint  Patient presents with   Fall   Alcohol Intoxication     Brief history of present illness from the day of admission and additional interim summary    43 year old female with history of alcoholic cirrhosis, portal vein hypertension, esophageal varices, pancytopenia, upper GI bleeding, pancreatitis, tobacco abuse, GERD, depression, anxiety, anemia presented with fall.  There was no loss of consciousness.  She apparently drank alcohol and took two 25 mg Seroquel.  She was initially hypotensive in the ED and was started on IV fluids.  COVID-19 test was negative.  Chest x-ray was negative for acute infiltrate.  X-ray of the pelvis was negative for fracture.CT of the head and C-spine was negative for any acute abnormality.  CT of the chest abdomen and pelvis showed no acute intrathoracic abnormality; right lower flank hematoma without evidence of active extravasation was seen along with cirrhosis with portal hypertension.                                                                  Hospital Course    Hypotension -due to dehydration has resolved after IVF.   Right flank hematoma after a fall -Monitor.  Hemoglobin stable.,   Ambulating in the room without any problems, CT head and neck unremarkable.  Right flank hematoma stable.  Was offered home health PT and RN which she refused along with home walker.   Alcoholic cirrhosis of liver with esophageal varices and portal hypertension -Stable continue home medications follow with PCP and primary GI post discharge.  Alcohol abuse -Counseled regarding alcohol cessation.  She says she is not drinking on a regular basis and that she drank the day of admission but is not doing it on a regular basis.   Depression and anxiety - continue home medications unchanged, counseled to quit alcohol use.  Cleared by psych this admission.  Patient states that she has not been drinking alcohol on a regular basis.  Refuses any outpatient help.   Pancytopenia, chronic -Continue monitoring.  Follows up with Dr. Lindi Adie as an outpatient  Hypoglycemia - stable with PO diet.  CBG (last 3)  Recent Labs    04/02/19 2116 04/03/19 0616 04/03/19 1128  GLUCAP 95 [phone]*     Discharge diagnosis     Principal Problem:   Alcohol use disorder, severe, dependence (HCC) Active Problems:   Anxiety state   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HCC)   Depression   GERD (gastroesophageal reflux disease)   Pancytopenia (HCC)   ETOH abuse   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[place])   Fall   Hypotension   Hypokalemia   Nodule on liver   Right flank hematoma   Hypoglycemia    Discharge instructions    Discharge Instructions    Diet - low sodium heart healthy   Complete by:  As directed    Discharge instructions   Complete by:  As directed    Follow with Primary MD in 7 days   Get CBC, CMP, 2 view Chest X ray -  checked  by Primary MD  in 5-7 days   Activity: As tolerated with Full fall precautions use walker/cane & assistance as needed  Disposition Home    Diet: Heart Healthy    Special Instructions: If you have smoked or chewed Tobacco  in the last 2 yrs please stop  smoking, stop any regular Alcohol  and or any Recreational drug use.  On your next visit with your primary care physician please Get Medicines reviewed and adjusted.  Please request your Prim.MD to go over all Hospital Tests and Procedure/Radiological results at the follow up, please get all Hospital records sent to your Prim MD by signing hospital release before you go home.  If you experience worsening of your admission symptoms, develop shortness of breath, life threatening emergency, suicidal or homicidal thoughts you must seek medical attention immediately by calling 911 or calling your MD immediately  if symptoms less severe.  You Must read complete instructions/literature along with all the possible adverse reactions/side effects for all the Medicines you take and that have been prescribed to you. Take any new Medicines after you have completely understood and accpet all the possible adverse reactions/side effects.   Do not drive, operate heavy machinery, perform activities at heights, swimming or participation in water activities or provide baby sitting services if your were admitted for syncope or siezures until you have seen by Primary MD or a Neurologist and advised to do so again.  Do not drive when taking Pain medications.  Do not take more than prescribed Pain, Sleep and Anxiety Medications  Wear Seat belts while driving.   Please note  You were cared for by a hospitalist during your hospital stay. If you have any questions about your discharge medications or the care you received while you were in the hospital after you are discharged, you can call the unit and asked to speak with the hospitalist on call if the hospitalist that took care of you is not available. Once you are discharged, your primary care physician will handle any further medical issues. Please note that NO REFILLS for any discharge medications will be authorized once you are discharged, as it is imperative that you  return to your primary care physician (or establish a relationship with a primary care physician if you do not have one) for your aftercare needs so that they can reassess your need for medications and monitor your lab values.   Increase activity slowly   Complete by:  As directed       Discharge Medications   Allergies as of 04/03/2019      Reactions   Morphine And Related Other (See Comments)  Slowed HR, lowered BP   Nsaids Other (See Comments)   Caused internal bleeding      Medication List    TAKE these medications   chlordiazePOXIDE 5 MG capsule Commonly known as:  LIBRIUM Please dispense 18 pills - Take 1 pill three times a day for 3 days, then Take 1 pill two times a day for 3 days, then Take 1 pill once a day for 3 days and stop. What changed:    medication strength  additional instructions   escitalopram 10 MG tablet Commonly known as:  Lexapro Take 1 tablet (10 mg total) by mouth daily. What changed:  when to take this   lactulose 10 GM/15ML solution Commonly known as:  CHRONULAC Take 15 mLs (10 g total) by mouth 3 (three) times daily.   lidocaine 2 % solution Commonly known as:  XYLOCAINE Use as directed 15 mLs in the mouth or throat as needed for mouth pain.   multivitamin with minerals Tabs tablet Take 1 tablet by mouth daily.   QUEtiapine 50 MG tablet Commonly known as:  SEROQUEL Take 50 mg by mouth at bedtime.   thiamine 100 MG tablet Commonly known as:  VITAMIN B-1 Take 1 tablet (100 mg total) by mouth daily.   topiramate 25 MG tablet Commonly known as:  TOPAMAX Take 25 mg by mouth 2 (two) times daily.            Durable Medical Equipment  (From admission, onward)         Start     Ordered   04/03/19 1240  For home use only DME Walker rolling  [facility])  Once    Question:  Patient needs a walker to treat with the following condition  Answer:  Fall   04/03/19 1241          Follow-up Information    [facility]. Schedule an appointment as soon as possible for a visit in 1 week(s).   Why:  or your PCP Contact information: [place] [phone]       [facility]. Go on 04/19/2019.   Specialty:  Internal Medicine Why:  Appointment in office location above on 5/29 at 1pm  Contact information: [place] [phone]          Major procedures and Radiology Reports - PLEASE review detailed and final reports thoroughly  -         Ct Head Wo Contrast  Result Date: 04/01/2019 CLINICAL DATA:  Fall down stairs EXAM: CT HEAD WITHOUT CONTRAST CT CERVICAL SPINE WITHOUT CONTRAST TECHNIQUE: Multidetector CT imaging of the head and cervical spine was performed following the standard protocol without intravenous contrast. Multiplanar CT image reconstructions of the cervical spine were also generated. COMPARISON:  Head CT 02/25/2018, 12/19/2017 FINDINGS: CT HEAD FINDINGS Brain: There is no mass, hemorrhage or extra-axial collection. The size and configuration of the ventricles and extra-axial CSF spaces are normal. The brain parenchyma is normal, without evidence of acute or chronic infarction. There are multiple scattered parenchymal calcifications, unchanged. Vascular: No abnormal hyperdensity of the major intracranial arteries or dural venous sinuses. No intracranial atherosclerosis. Skull: The visualized skull base, calvarium and extracranial soft tissues are normal. Sinuses/Orbits: No fluid levels or advanced mucosal thickening of the visualized paranasal sinuses. No mastoid or middle ear effusion. The orbits are normal. CT CERVICAL SPINE FINDINGS Alignment: No static subluxation. Facets are  aligned. Occipital condyles are normally positioned. Skull base and vertebrae: No acute fracture. Soft tissues and spinal canal: No prevertebral fluid or swelling. No visible canal hematoma. Disc levels:  No advanced spinal canal or neural foraminal stenosis. Upper chest: No pneumothorax, pulmonary nodule or pleural effusion. Other: Normal visualized paraspinal cervical soft tissues. IMPRESSION: 1. No acute abnormality of the head or cervical spine. 2. 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 Electronically Signed   By: Ulyses Jarred M.D.   On: 04/01/2019 19:04   Ct Chest W Contrast  Result Date: 04/01/2019 CLINICAL DATA:  Acute pain due to trauma EXAM: CT CHEST AND ABDOMEN WITH CONTRAST TECHNIQUE: Multidetector CT imaging of the chest and abdomen was performed following the standard protocol during bolus administration of intravenous contrast. CONTRAST:  165mL OMNIPAQUE IOHEXOL 300 MG/ML  SOLN COMPARISON:  CT dated 03/02 2019 CT abdomen pelvis dated 08/30/2018. FINDINGS: CT CHEST FINDINGS Cardiovascular: No significant vascular findings. Normal heart size. No pericardial effusion. Mediastinum/Nodes: No enlarged mediastinal, hilar, or axillary lymph nodes. Thyroid gland, trachea, and esophagus demonstrate no significant findings. Lungs/Pleura: Lungs are clear. No pleural effusion or pneumothorax. Musculoskeletal: No chest wall mass or suspicious bone lesions identified. CT ABDOMEN FINDINGS Hepatobiliary: There is cirrhosis with sequela of portal hypertension including recanalization of the umbilical vein. There is a 1 cm hypoattenuating nodule in hepatic segment 3 (axial series 3, image 72). This does not definitively identify washout on the delayed phase. This lesion is present on prior CT and appears stable. This same lesion is not readily identified on CT from 2016. The patient is status post prior cholecystectomy. There is moderate dilatation of the common bile duct which is similar to prior studies. Pancreas: Unremarkable. No pancreatic ductal dilatation or surrounding inflammatory changes. Spleen: The spleen is significantly enlarged. There is some  portosystemic shunting. Adrenals/Urinary Tract: Adrenal glands are unremarkable. Kidneys are normal, without renal calculi, focal lesion, or hydronephrosis. The bladder is moderately distended. Stomach/Bowel: Stomach is within normal limits. Appendix appears normal. No evidence of bowel wall thickening, distention, or inflammatory changes. Vascular/Lymphatic: No significant atherosclerotic changes. No abdominal aortic aneurysm. Again identified are findings of portal hypertension. Other: There is a right flank hematoma without evidence of active extravasation. Reproductive: The uterus is unremarkable.  There is no adnexal mass. Musculoskeletal: No acute or significant osseous findings. IMPRESSION: 1. No acute intrathoracic abnormality detected. 2. Right lower flank hematoma without evidence of active extravasation. 3. No acute osseous abnormality detected. 4. Cirrhosis with sequela of portal hypertension. There is a small 1 cm hyperattenuating nodule in hepatic segment 3 as detailed above. This is incompletely characterized on this exam. Outpatient follow-up with a liver mass protocol CT or MRI is recommended for further evaluation of this finding. 5. Moderately distended urinary bladder. Electronically Signed   By: Constance Holster M.D.   On: 04/01/2019 19:20   Ct Cervical Spine Wo Contrast  Result Date: 04/01/2019 CLINICAL DATA:  Fall down stairs EXAM: CT HEAD WITHOUT CONTRAST CT CERVICAL SPINE WITHOUT CONTRAST TECHNIQUE: Multidetector CT imaging of the head and cervical spine was performed following the standard protocol without intravenous contrast. Multiplanar CT image reconstructions of the cervical spine were also generated. COMPARISON:  Head CT 02/25/2018, 12/19/2017 FINDINGS: CT HEAD FINDINGS Brain: There is no mass, hemorrhage or extra-axial collection. The size and configuration of the ventricles and extra-axial CSF spaces are normal. The brain parenchyma is normal, without evidence of acute or  chronic infarction. There are multiple scattered parenchymal calcifications,  unchanged. Vascular: No abnormal hyperdensity of the major intracranial arteries or dural venous sinuses. No intracranial atherosclerosis. Skull: The visualized skull base, calvarium and extracranial soft tissues are normal. Sinuses/Orbits: No fluid levels or advanced mucosal thickening of the visualized paranasal sinuses. No mastoid or middle ear effusion. The orbits are normal. CT CERVICAL SPINE FINDINGS Alignment: No static subluxation. Facets are aligned. Occipital condyles are normally positioned. Skull base and vertebrae: No acute fracture. Soft tissues and spinal canal: No prevertebral fluid or swelling. No visible canal hematoma. Disc levels: No advanced spinal canal or neural foraminal stenosis. Upper chest: No pneumothorax, pulmonary nodule or pleural effusion. Other: Normal visualized paraspinal cervical soft tissues. IMPRESSION: 1. No acute abnormality of the head or cervical spine. 2. 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 Electronically Signed   By: Ulyses Jarred M.D.   On: 04/01/2019 19:04   Ct Abdomen Pelvis W Contrast  Result Date: 04/01/2019 CLINICAL DATA:  Acute pain due to trauma EXAM: CT CHEST AND ABDOMEN WITH CONTRAST TECHNIQUE: Multidetector CT imaging of the chest and abdomen was performed following the standard protocol during bolus administration of intravenous contrast. CONTRAST:  120mL OMNIPAQUE IOHEXOL 300 MG/ML  SOLN COMPARISON:  CT dated 03/02 2019 CT abdomen pelvis dated 08/30/2018. FINDINGS: CT CHEST FINDINGS Cardiovascular: No significant vascular findings. Normal heart size. No pericardial effusion. Mediastinum/Nodes: No enlarged mediastinal, hilar, or axillary lymph nodes. Thyroid gland, trachea, and esophagus demonstrate no significant findings. Lungs/Pleura: Lungs are clear. No pleural effusion or pneumothorax. Musculoskeletal: No chest wall  mass or suspicious bone lesions identified. CT ABDOMEN FINDINGS Hepatobiliary: There is cirrhosis with sequela of portal hypertension including recanalization of the umbilical vein. There is a 1 cm hypoattenuating nodule in hepatic segment 3 (axial series 3, image 72). This does not definitively identify washout on the delayed phase. This lesion is present on prior CT and appears stable. This same lesion is not readily identified on CT from 2016. The patient is status post prior cholecystectomy. There is moderate dilatation of the common bile duct which is similar to prior studies. Pancreas: Unremarkable. No pancreatic ductal dilatation or surrounding inflammatory changes. Spleen: The spleen is significantly enlarged. There is some portosystemic shunting. Adrenals/Urinary Tract: Adrenal glands are unremarkable. Kidneys are normal, without renal calculi, focal lesion, or hydronephrosis. The bladder is moderately distended. Stomach/Bowel: Stomach is within normal limits. Appendix appears normal. No evidence of bowel wall thickening, distention, or inflammatory changes. Vascular/Lymphatic: No significant atherosclerotic changes. No abdominal aortic aneurysm. Again identified are findings of portal hypertension. Other: There is a right flank hematoma without evidence of active extravasation. Reproductive: The uterus is unremarkable.  There is no adnexal mass. Musculoskeletal: No acute or significant osseous findings. IMPRESSION: 1. No acute intrathoracic abnormality detected. 2. Right lower flank hematoma without evidence of active extravasation. 3. No acute osseous abnormality detected. 4. Cirrhosis with sequela of portal hypertension. There is a small 1 cm hyperattenuating nodule in hepatic segment 3 as detailed above. This is incompletely characterized on this exam. Outpatient follow-up with a liver mass protocol CT or MRI is recommended for further evaluation of this finding. 5. Moderately distended urinary bladder.  Electronically Signed   By: Constance Holster M.D.   On: 04/01/2019 19:20   Dg Pelvis Portable  Result Date: 04/01/2019 CLINICAL DATA:  Status post fall down 16 stairs today. Pain. Initial encounter. EXAM: PORTABLE PELVIS 1-2 VIEWS COMPARISON:  CT abdomen and pelvis 08/30/2018. FINDINGS: There is no evidence of pelvic fracture or diastasis. No  pelvic bone lesions are seen. IMPRESSION: Normal exam. Electronically Signed   By: Inge Rise M.D.   On: 04/01/2019 18:23   Dg Chest Portable 1 View  Result Date: 04/01/2019 CLINICAL DATA:  Fall. EXAM: PORTABLE CHEST 1 VIEW COMPARISON:  Chest x-ray dated March 18, 2019. FINDINGS: The patient is rotated to the left. The heart size and mediastinal contours are within normal limits. Both lungs are clear. The visualized skeletal structures are unremarkable. IMPRESSION: No active disease. Electronically Signed   By: Titus Dubin M.D.   On: 04/01/2019 18:06   Dg Chest Port 1 View  Result Date: 03/18/2019 CLINICAL DATA:  43 year old female with fever EXAM: PORTABLE CHEST 1 VIEW COMPARISON:  03/10/2019 FINDINGS: The heart size and mediastinal contours are within normal limits. Both lungs are clear. The visualized skeletal structures are unremarkable. IMPRESSION: Negative for acute cardiopulmonary disease Electronically Signed   By: Corrie Mckusick D.O.   On: 03/18/2019 21:49   Dg Chest Portable 1 View  Result Date: 03/10/2019 CLINICAL DATA:  Cough EXAM: PORTABLE CHEST 1 VIEW COMPARISON:  09/08/2018 FINDINGS: The heart size and mediastinal contours are within normal limits. Both lungs are clear. The visualized skeletal structures are unremarkable. IMPRESSION: No active disease. Electronically Signed   By: Kathreen Devoid   On: 03/10/2019 12:29    Micro Results    Recent Results (from the past 240 hour(s))  SARS Coronavirus 2 (CEPHEID - Performed in [facility] hospital lab), Hosp Order     Status: None   Collection Time: 04/01/19  8:55 PM  Result Value Ref  Range Status   SARS Coronavirus 2 NEGATIVE NEGATIVE Final    Comment: (NOTE) If result is NEGATIVE SARS-CoV-2 target nucleic acids are NOT DETECTED. The SARS-CoV-2 RNA is generally detectable in upper and lower  respiratory specimens during the acute phase of infection. The lowest  concentration of SARS-CoV-2 viral copies this assay can detect is 250  copies / mL. A negative result does not preclude SARS-CoV-2 infection  and should not be used as the sole basis for treatment or other  patient management decisions.  A negative result may occur with  improper specimen collection / handling, submission of specimen other  than nasopharyngeal swab, presence of viral mutation(s) within the  areas targeted by this assay, and inadequate number of viral copies  (<250 copies / mL). A negative result must be combined with clinical  observations, patient history, and epidemiological information. If result is POSITIVE SARS-CoV-2 target nucleic acids are DETECTED. The SARS-CoV-2 RNA is generally detectable in upper and lower  respiratory specimens dur ing the acute phase of infection.  Positive  results are indicative of active infection with SARS-CoV-2.  Clinical  correlation with patient history and other diagnostic information is  necessary to determine patient infection status.  Positive results do  not rule out bacterial infection or co-infection with other viruses. If result is PRESUMPTIVE POSTIVE SARS-CoV-2 nucleic acids MAY BE PRESENT.   A presumptive positive result was obtained on the submitted specimen  and confirmed on repeat testing.  While 2019 novel coronavirus  (SARS-CoV-2) nucleic acids may be present in the submitted sample  additional confirmatory testing may be necessary for epidemiological  and / or clinical management purposes  to differentiate between  SARS-CoV-2 and other Sarbecovirus currently known to infect humans.  If clinically indicated additional testing with an  alternate test  methodology [phone]) is advised. The SARS-CoV-2 RNA is generally  detectable in upper and lower respiratory sp ecimens  during the acute  phase of infection. The expected result is Negative. Fact Sheet for Patients:  StrictlyIdeas.no Fact Sheet for Healthcare Providers: BankingDealers.co.za This test is not yet approved or cleared by the [place] FDA and has been authorized for detection and/or diagnosis of SARS-CoV-2 by FDA under an Emergency Use Authorization (EUA).  This EUA will remain in effect (meaning this test can be used) for the duration of the COVID-19 declaration under Section 564(b)(1) of the Act, 21 U.S.C. section 360bbb-3(b)(1), unless the authorization is terminated or revoked sooner. Performed at [facility], [place] [address], [place], [place] [address]   MRSA PCR Screening     Status: Abnormal   Collection Time: 04/01/19 11:36 PM  Result Value Ref Range Status   MRSA by PCR POSITIVE (A) NEGATIVE Final    Comment:        The GeneXpert MRSA Assay (FDA approved for NASAL specimens only), is one component of a comprehensive MRSA colonization surveillance program. It is not intended to diagnose MRSA infection nor to guide or monitor treatment for MRSA infections. RESULT CALLED TO, READ BACK BY AND VERIFIED WITH: MILLER,J RN [id number] 04/02/2019 MITCHELL,L Performed at [facility], [place] [address], [place], [place] [address]     Today   Subjective    Kimyata Milich today has no headache,no chest abdominal pain,no new weakness tingling or numbness, feels much better wants to go home today.    Objective   Blood pressure (!) 103/53, pulse 77, temperature 98.4 F (36.9 C), temperature source Oral, resp. rate 20, height 5\' 3"  (1.6 m), weight 73.8 kg, last menstrual period 10/02/2018, SpO2 100 %.   Intake/Output Summary (Last 24 hours) at 04/03/2019 1344 Last data filed at 04/03/2019  1300 Gross per 24 hour  Intake 2792.4 ml  Output 1950 ml  Net 842.4 ml    Exam Awake Alert, Oriented x 3, No new F.N deficits, Normal affect .AT,PERRAL Supple Neck,No JVD, No cervical lymphadenopathy appriciated.  Symmetrical Chest wall movement, Good air movement bilaterally, CTAB RRR,No Gallops,Rubs or new Murmurs, No Parasternal Heave +ve B.Sounds, Abd Soft, Non tender, No organomegaly appriciated, No rebound -guarding or rigidity. No Cyanosis, Clubbing or edema, sable R f.flank bruise   Data Review   CBC w Diff:  Lab Results  Component Value Date   WBC 2.0 (L) 04/03/2019   HGB 8.6 (L) 04/03/2019   HGB 11.2 (L) 01/23/2019   HGB 12.4 09/26/2017   HCT 26.7 (L) 04/03/2019   HCT 36.6 09/26/2017   PLT 61 (L) 04/03/2019   PLT 76 (L) 01/23/2019   PLT 44 (L) 09/26/2017   LYMPHOPCT 40 04/03/2019   LYMPHOPCT 37.7 09/26/2017   BANDSPCT 0 09/07/2018   MONOPCT 10 04/03/2019   MONOPCT 11.2 09/26/2017   EOSPCT 2 04/03/2019   EOSPCT 0.7 09/26/2017   BASOPCT 1 04/03/2019   BASOPCT 0.5 09/26/2017    CMP:  Lab Results  Component Value Date   NA 138 04/03/2019   NA 138 09/21/2017   K 3.3 (L) 04/03/2019   K 3.8 09/21/2017   CL 112 (H) 04/03/2019   CO2 20 (L) 04/03/2019   CO2 25 09/21/2017   BUN <5 (L) 04/03/2019   BUN 12.1 09/21/2017   CREATININE 0.61 04/03/2019   CREATININE 0.6 09/21/2017   PROT 6.0 (L) 04/03/2019   PROT 7.7 09/21/2017   ALBUMIN 2.7 (L) 04/03/2019   ALBUMIN 3.6 09/21/2017   BILITOT 1.6 (H) 04/03/2019   BILITOT 1.56 (H) 09/21/2017  ALKPHOS 68 04/03/2019   ALKPHOS 112 09/21/2017   AST 40 04/03/2019   AST 54 (H) 09/21/2017   ALT 23 04/03/2019   ALT 34 09/21/2017  .   Total Time in preparing paper work, data evaluation and todays exam - 51 minutes  Lala Lund M.D on 04/03/2019 at 1:44 PM  [facility]   Office  [phone]

## 2019-04-03 NOTE — Discharge Instructions (Signed)
Follow with Primary MD in 7 days   Get CBC, CMP, 2 view Chest X ray -  checked  by Primary MD  in 5-7 days   Activity: As tolerated with Full fall precautions use walker/cane & assistance as needed  Disposition Home    Diet: Heart Healthy    Special Instructions: If you have smoked or chewed Tobacco  in the last 2 yrs please stop smoking, stop any regular Alcohol  and or any Recreational drug use.  On your next visit with your primary care physician please Get Medicines reviewed and adjusted.  Please request your Prim.MD to go over all Hospital Tests and Procedure/Radiological results at the follow up, please get all Hospital records sent to your Prim MD by signing hospital release before you go home.  If you experience worsening of your admission symptoms, develop shortness of breath, life threatening emergency, suicidal or homicidal thoughts you must seek medical attention immediately by calling 911 or calling your MD immediately  if symptoms less severe.  You Must read complete instructions/literature along with all the possible adverse reactions/side effects for all the Medicines you take and that have been prescribed to you. Take any new Medicines after you have completely understood and accpet all the possible adverse reactions/side effects.   Do not drive, operate heavy machinery, perform activities at heights, swimming or participation in water activities or provide baby sitting services if your were admitted for syncope or siezures until you have seen by Primary MD or a Neurologist and advised to do so again.  Do not drive when taking Pain medications.  Do not take more than prescribed Pain, Sleep and Anxiety Medications  Wear Seat belts while driving.   Please note  You were cared for by a hospitalist during your hospital stay. If you have any questions about your discharge medications or the care you received while you were in the hospital after you are discharged, you can  call the unit and asked to speak with the hospitalist on call if the hospitalist that took care of you is not available. Once you are discharged, your primary care physician will handle any further medical issues. Please note that NO REFILLS for any discharge medications will be authorized once you are discharged, as it is imperative that you return to your primary care physician (or establish a relationship with a primary care physician if you do not have one) for your aftercare needs so that they can reassess your need for medications and monitor your lab values.
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
## 2019-04-04 ENCOUNTER — Other Ambulatory Visit: Payer: Self-pay

## 2019-04-04 ENCOUNTER — Ambulatory Visit: Payer: Self-pay | Admitting: Hematology and Oncology

## 2019-04-07 LAB — CULTURE, BLOOD (ROUTINE X 2)
Culture: NO GROWTH
Culture: NO GROWTH
Special Requests: ADEQUATE
Special Requests: ADEQUATE

## 2019-04-19 ENCOUNTER — Encounter (HOSPITAL_COMMUNITY): Payer: Self-pay | Admitting: Emergency Medicine

## 2019-04-19 ENCOUNTER — Other Ambulatory Visit: Payer: Self-pay

## 2019-04-19 ENCOUNTER — Ambulatory Visit: Payer: Self-pay | Admitting: Family Medicine

## 2019-04-19 ENCOUNTER — Inpatient Hospital Stay (HOSPITAL_COMMUNITY)
Admission: AD | Admit: 2019-04-19 | Discharge: 2019-04-23 | DRG: 885 | Disposition: A | Discharge status: Home / Self Care | Payer: No Typology Code available for payment source | Source: Intra-hospital | Attending: Psychiatry | Admitting: Psychiatry

## 2019-04-19 ENCOUNTER — Emergency Department (HOSPITAL_COMMUNITY)
Admission: EM | Admit: 2019-04-19 | Discharge: 2019-04-19 | Disposition: A | Discharge status: Other Acute Inpatient Hospital | Payer: Self-pay | Attending: Emergency Medicine | Admitting: Emergency Medicine

## 2019-04-19 DIAGNOSIS — Z1159 Encounter for screening for other viral diseases: Secondary | ICD-10-CM | POA: Insufficient documentation

## 2019-04-19 DIAGNOSIS — F17210 Nicotine dependence, cigarettes, uncomplicated: Secondary | ICD-10-CM | POA: Diagnosis present

## 2019-04-19 DIAGNOSIS — F1020 Alcohol dependence, uncomplicated: Secondary | ICD-10-CM | POA: Insufficient documentation

## 2019-04-19 DIAGNOSIS — R45851 Suicidal ideations: Secondary | ICD-10-CM

## 2019-04-19 DIAGNOSIS — Z79899 Other long term (current) drug therapy: Secondary | ICD-10-CM | POA: Insufficient documentation

## 2019-04-19 DIAGNOSIS — F319 Bipolar disorder, unspecified: Principal | ICD-10-CM | POA: Diagnosis present

## 2019-04-19 DIAGNOSIS — Y908 Blood alcohol level of 240 mg/100 ml or more: Secondary | ICD-10-CM | POA: Diagnosis present

## 2019-04-19 DIAGNOSIS — F10239 Alcohol dependence with withdrawal, unspecified: Secondary | ICD-10-CM | POA: Diagnosis present

## 2019-04-19 DIAGNOSIS — F1024 Alcohol dependence with alcohol-induced mood disorder: Secondary | ICD-10-CM | POA: Diagnosis present

## 2019-04-19 DIAGNOSIS — F3113 Bipolar disorder, current episode manic without psychotic features, severe: Secondary | ICD-10-CM | POA: Insufficient documentation

## 2019-04-19 DIAGNOSIS — F10229 Alcohol dependence with intoxication, unspecified: Secondary | ICD-10-CM | POA: Diagnosis not present

## 2019-04-19 DIAGNOSIS — K7030 Alcoholic cirrhosis of liver without ascites: Secondary | ICD-10-CM | POA: Diagnosis present

## 2019-04-19 DIAGNOSIS — F3181 Bipolar II disorder: Secondary | ICD-10-CM | POA: Diagnosis not present

## 2019-04-19 DIAGNOSIS — I10 Essential (primary) hypertension: Secondary | ICD-10-CM | POA: Insufficient documentation

## 2019-04-19 LAB — CBC WITH DIFFERENTIAL/PLATELET
Abs Immature Granulocytes: 0 10*3/uL (ref 0.00–0.07)
Basophils Absolute: 0 10*3/uL (ref 0.0–0.1)
Basophils Relative: 1 %
Eosinophils Absolute: 0 10*3/uL (ref 0.0–0.5)
Eosinophils Relative: 1 %
HCT: 32.4 % — ABNORMAL LOW (ref 36.0–46.0)
Hemoglobin: 10.2 g/dL — ABNORMAL LOW (ref 12.0–15.0)
Immature Granulocytes: 0 %
Lymphocytes Relative: 55 %
Lymphs Abs: 1.7 10*3/uL (ref 0.7–4.0)
MCH: 28.3 pg (ref 26.0–34.0)
MCHC: 31.5 g/dL (ref 30.0–36.0)
MCV: 89.8 fL (ref 80.0–100.0)
Monocytes Absolute: 0.2 10*3/uL (ref 0.1–1.0)
Monocytes Relative: 8 %
Neutro Abs: 1.1 10*3/uL — ABNORMAL LOW (ref 1.7–7.7)
Neutrophils Relative %: 35 %
Platelets: 68 10*3/uL — ABNORMAL LOW (ref 150–400)
RBC: 3.61 MIL/uL — ABNORMAL LOW (ref 3.87–5.11)
RDW: 19.2 % — ABNORMAL HIGH (ref 11.5–15.5)
WBC: 3.1 10*3/uL — ABNORMAL LOW (ref 4.0–10.5)
nRBC: 0 % (ref 0.0–0.2)

## 2019-04-19 LAB — RAPID URINE DRUG SCREEN, HOSP PERFORMED
Amphetamines: NOT DETECTED
Barbiturates: NOT DETECTED
Benzodiazepines: POSITIVE — AB
Cocaine: NOT DETECTED
Opiates: NOT DETECTED
Tetrahydrocannabinol: NOT DETECTED

## 2019-04-19 LAB — I-STAT BETA HCG BLOOD, ED (MC, WL, AP ONLY): I-stat hCG, quantitative: <5 m[IU]/mL (ref <5)

## 2019-04-19 LAB — COMPREHENSIVE METABOLIC PANEL
ALT: 20 U/L (ref 0–44)
AST: 41 U/L (ref 15–41)
Albumin: 3.8 g/dL (ref 3.5–5.0)
Alkaline Phosphatase: 82 U/L (ref 38–126)
Anion gap: 7 (ref 5–15)
BUN: 5 mg/dL — ABNORMAL LOW (ref 6–20)
CO2: 22 mmol/L (ref 22–32)
Calcium: 8 mg/dL — ABNORMAL LOW (ref 8.9–10.3)
Chloride: 113 mmol/L — ABNORMAL HIGH (ref 98–111)
Creatinine, Ser: 0.56 mg/dL (ref 0.44–1.00)
GFR calc Af Amer: >60 mL/min (ref >60)
GFR calc non Af Amer: >60 mL/min (ref >60)
Glucose, Bld: 99 mg/dL (ref 70–99)
Potassium: 3.3 mmol/L — ABNORMAL LOW (ref 3.5–5.1)
Sodium: 142 mmol/L (ref 135–145)
Total Bilirubin: 0.8 mg/dL (ref 0.3–1.2)
Total Protein: 7.5 g/dL (ref 6.5–8.1)

## 2019-04-19 LAB — SALICYLATE LEVEL: Salicylate Lvl: <7 mg/dL (ref 2.8–30.0)

## 2019-04-19 LAB — ACETAMINOPHEN LEVEL: Acetaminophen (Tylenol), Serum: <10 ug/mL — ABNORMAL LOW (ref 10–30)

## 2019-04-19 LAB — ETHANOL: Alcohol, Ethyl (B): 345 mg/dL (ref <10)

## 2019-04-19 LAB — SARS CORONAVIRUS 2 BY RT PCR (HOSPITAL ORDER, PERFORMED IN ~~LOC~~ HOSPITAL LAB): SARS Coronavirus 2: NEGATIVE

## 2019-04-19 MED ORDER — ESCITALOPRAM OXALATE 10 MG PO TABS
10.0000 mg | ORAL_TABLET | Freq: Every day | ORAL | Status: DC
  Administered 2019-04-20 – 2019-04-22 (×3): 10 mg via ORAL
  Filled 2019-04-19 (×6): qty 1

## 2019-04-19 MED ORDER — SODIUM CHLORIDE 0.9 % IV BOLUS: 1000.0000 mL | Freq: Once | INTRAVENOUS | Status: DC

## 2019-04-19 MED ORDER — ACETAMINOPHEN 325 MG PO TABS: 650.0000 mg | ORAL_TABLET | Freq: Four times a day (QID) | ORAL | Status: DC | PRN

## 2019-04-19 MED ORDER — LOPERAMIDE HCL 2 MG PO CAPS: 2.0000 mg | ORAL_CAPSULE | ORAL | Status: AC | PRN

## 2019-04-19 MED ORDER — THIAMINE HCL 100 MG/ML IJ SOLN: 100.0000 mg | Freq: Once | INTRAMUSCULAR | Status: DC

## 2019-04-19 MED ORDER — LORAZEPAM 1 MG PO TABS
1.0000 mg | ORAL_TABLET | Freq: Three times a day (TID) | ORAL | Status: AC
  Administered 2019-04-21 (×3): 1 mg via ORAL
  Filled 2019-04-19 (×3): qty 1

## 2019-04-19 MED ORDER — SODIUM CHLORIDE 0.9 % IV SOLN: INTRAVENOUS | Status: DC

## 2019-04-19 MED ORDER — TRAZODONE HCL 50 MG PO TABS
50.0000 mg | ORAL_TABLET | Freq: Every evening | ORAL | Status: DC | PRN
  Administered 2019-04-19: 50 mg via ORAL
  Filled 2019-04-19 (×2): qty 1

## 2019-04-19 MED ORDER — ALUM & MAG HYDROXIDE-SIMETH 200-200-20 MG/5ML PO SUSP: 30.0000 mL | ORAL | Status: DC | PRN

## 2019-04-19 MED ORDER — SODIUM CHLORIDE 0.9 % IV BOLUS
1000.0000 mL | Freq: Once | INTRAVENOUS | Status: AC
  Administered 2019-04-19: 11:00:00 1000 mL via INTRAVENOUS

## 2019-04-19 MED ORDER — LORAZEPAM 1 MG PO TABS
1.0000 mg | ORAL_TABLET | Freq: Every day | ORAL | Status: AC
  Administered 2019-04-23: 1 mg via ORAL
  Filled 2019-04-19: qty 1

## 2019-04-19 MED ORDER — LORAZEPAM 1 MG PO TABS
1.0000 mg | ORAL_TABLET | Freq: Four times a day (QID) | ORAL | Status: AC | PRN
  Administered 2019-04-19 – 2019-04-21 (×2): 1 mg via ORAL
  Filled 2019-04-19 (×2): qty 1

## 2019-04-19 MED ORDER — ONDANSETRON 4 MG PO TBDP: 4.0000 mg | ORAL_TABLET | Freq: Four times a day (QID) | ORAL | Status: AC | PRN

## 2019-04-19 MED ORDER — VITAMIN B-1 100 MG PO TABS
100.0000 mg | ORAL_TABLET | Freq: Every day | ORAL | Status: DC
  Administered 2019-04-20 – 2019-04-23 (×4): 100 mg via ORAL
  Filled 2019-04-19 (×5): qty 1

## 2019-04-19 MED ORDER — LORAZEPAM 1 MG PO TABS
1.0000 mg | ORAL_TABLET | Freq: Four times a day (QID) | ORAL | Status: AC
  Administered 2019-04-20 (×4): 1 mg via ORAL
  Filled 2019-04-19 (×4): qty 1

## 2019-04-19 MED ORDER — MAGNESIUM HYDROXIDE 400 MG/5ML PO SUSP
30.0000 mL | Freq: Every day | ORAL | Status: DC | PRN
  Administered 2019-04-22: 30 mL via ORAL
  Filled 2019-04-19: qty 30

## 2019-04-19 MED ORDER — LORAZEPAM 1 MG PO TABS
1.0000 mg | ORAL_TABLET | Freq: Two times a day (BID) | ORAL | Status: AC
  Administered 2019-04-22 (×2): 1 mg via ORAL
  Filled 2019-04-19 (×2): qty 1

## 2019-04-19 MED ORDER — HYDROXYZINE HCL 25 MG PO TABS
25.0000 mg | ORAL_TABLET | Freq: Four times a day (QID) | ORAL | Status: AC | PRN
  Administered 2019-04-19 – 2019-04-22 (×4): 25 mg via ORAL
  Filled 2019-04-19 (×4): qty 1

## 2019-04-19 MED ORDER — SODIUM CHLORIDE 0.9 % IV BOLUS
1000.0000 mL | Freq: Once | INTRAVENOUS | Status: AC
  Administered 2019-04-19: 1000 mL via INTRAVENOUS

## 2019-04-19 MED ORDER — ADULT MULTIVITAMIN W/MINERALS CH
1.0000 | ORAL_TABLET | Freq: Every day | ORAL | Status: DC
  Administered 2019-04-20 – 2019-04-23 (×4): 1 via ORAL
  Filled 2019-04-19 (×5): qty 1

## 2019-04-19 NOTE — ED Notes (Signed)
Bed: BZ20 Expected date:  Expected time:  Means of arrival:  Comments: EMS: Overdose

## 2019-04-19 NOTE — Tx Team (Addendum)
Initial Treatment Plan 04/19/2019 6:31 PM Madison Hickman CNO:[id number]    PATIENT STRESSORS: Health problems Substance abuse   PATIENT STRENGTHS: Ability for insight Average or above average intelligence General fund of knowledge   PATIENT IDENTIFIED PROBLEMS: I drink too much  I cannot sleep good                   DISCHARGE CRITERIA:  Ability to meet basic life and health needs Adequate post-discharge living arrangements Improved stabilization in mood, thinking, and/or behavior Medical problems require only outpatient monitoring Motivation to continue treatment in a less acute level of care Need for constant or close observation no longer present Reduction of life-threatening or endangering symptoms to within safe limits Safe-care adequate arrangements made Verbal commitment to aftercare and medication compliance Withdrawal symptoms are absent or subacute and managed without 24-hour nursing intervention  PRELIMINARY DISCHARGE PLAN: Attend aftercare/continuing care group Attend 12-step recovery group Return to previous living arrangement Return to previous work or school arrangements  PATIENT/FAMILY INVOLVEMENT: This treatment plan has been presented to and reviewed with the patient, Trisa Cranor, and/or family member, family.  The patient and family have been given the opportunity to ask questions and make suggestions.  Viann Fish, RN 04/19/2019, 6:31 PM
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
## 2019-04-19 NOTE — BH Assessment (Signed)
[facility] Assessment Progress Note  Per Buford Dresser, DO, this pt requires psychiatric hospitalization at this time.  [name], RN, [facility] has assigned pt to [facility] Rm [id number] once pt is medically cleared.  Pt has signed Voluntary Admission and Consent for Treatment, as well as Consent to Release Information to pt's sister and to [name], and a notification call has been placed to the latter.  Signed forms have been faxed to [facility].  Pt's nurse has been notified, and agrees to send original paperwork along with pt via [place], and to call report to [phone].  Jalene Mullet, [place] Coordinator [phone]
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
## 2019-04-19 NOTE — ED Triage Notes (Signed)
Transported by [facility] from home-- husband called 56 and stated that patient had taken a handful of her Hydroxyzine pills this morning (although patient only reports taking 2.) Patient also reports that she consumed alcohol at approximately 2 am. Patient reports being depressed about her deceased sister. VSS. EMS administered 4 mg of Zofran PTA.

## 2019-04-19 NOTE — Progress Notes (Signed)
Pt is anxious and cooperative on admission to [facility].  Affect blunted, mood depressed.  Denies suicide attempt and reports problem sleeping which is the reason she stated for taking several vistaril last night.  She admits to binge drinking alcohol, frequently 2 or 3 bottles of wine and does this at least weekly.  Pt reports problem with anxiety, sleep, depression and substance dependence. She is pleasant to work with.  Skin assessment completed and pt does have multiple red petechiae on chest. No contraband found. No other problems noted.

## 2019-04-19 NOTE — Progress Notes (Addendum)
D   Pt complained of withdrawal symptoms to include sweats,anxiety ,shakeiness    Pt said she takes seroquel for sleep and was encouraged to speak to the doctor in the AM to see if he plans to restart that however she did receive sleep medications A    Verbal support given   Medications administered and effectiveness monitored    Q 15 min checks  R  Pt remains safe at this time  [place] NOVEL CORONAVIRUS (COVID-19) DAILY CHECK-OFF SYMPTOMS - answer yes or no to each - every day NO YES  Have you had a fever in the past 24 hours?  . Fever (Temp > 37.80C / 100F) X   Have you had any of these symptoms in the past 24 hours? . New Cough .  Sore Throat  .  Shortness of Breath .  Difficulty Breathing .  Unexplained Body Aches   X   Have you had any one of these symptoms in the past 24 hours not related to allergies?   . Runny Nose .  Nasal Congestion .  Sneezing   X   If you have had runny nose, nasal congestion, sneezing in the past 24 hours, has it worsened?  X   EXPOSURES - check yes or no X   Have you traveled outside the state in the past 14 days?  X   Have you been in contact with someone with a confirmed diagnosis of COVID-19 or PUI in the past 14 days without wearing appropriate PPE?  X   Have you been living in the same home as a person with confirmed diagnosis of COVID-19 or a PUI (household contact)?    X   Have you been diagnosed with COVID-19?    X              What to do next: Answered NO to all: Answered YES to anything:   Proceed with unit schedule Follow the [facility] Inpatient Flowsheet.

## 2019-04-19 NOTE — ED Notes (Signed)
[facility] contacted regarding patient transport.

## 2019-04-20 DIAGNOSIS — F3181 Bipolar II disorder: Secondary | ICD-10-CM

## 2019-04-20 DIAGNOSIS — F1024 Alcohol dependence with alcohol-induced mood disorder: Secondary | ICD-10-CM

## 2019-04-20 MED ORDER — QUETIAPINE FUMARATE 50 MG PO TABS: 50.0000 mg | ORAL_TABLET | Freq: Every day | ORAL | Status: DC

## 2019-04-20 MED ORDER — QUETIAPINE FUMARATE 25 MG PO TABS
25.0000 mg | ORAL_TABLET | Freq: Every day | ORAL | Status: DC
  Administered 2019-04-20 – 2019-04-21 (×2): 25 mg via ORAL
  Filled 2019-04-20 (×4): qty 1

## 2019-04-20 MED ORDER — TOPIRAMATE 25 MG PO TABS
25.0000 mg | ORAL_TABLET | Freq: Two times a day (BID) | ORAL | Status: DC
  Administered 2019-04-20 – 2019-04-22 (×4): 25 mg via ORAL
  Filled 2019-04-20 (×8): qty 1

## 2019-04-20 MED ORDER — POTASSIUM CHLORIDE CRYS ER 10 MEQ PO TBCR
10.0000 meq | EXTENDED_RELEASE_TABLET | Freq: Two times a day (BID) | ORAL | Status: AC
  Administered 2019-04-20 – 2019-04-22 (×4): 10 meq via ORAL
  Filled 2019-04-20 (×4): qty 1

## 2019-04-20 NOTE — Progress Notes (Signed)
D.  Pt pleasant on approach, no complaints voiced at this time.  Pt was positive for evening wrap up group, observed engaged appropriately with peers on the unit.  Pt denies SI/HI/AVH at this time.  A.  Support and encouragement offered, medication given as ordered  R  Pt remains safe on the unit, will continue to monitor.

## 2019-04-20 NOTE — [facility] Suicide Risk Assessment (Signed)
[facility] Admission Suicide Risk Assessment   Nursing information obtained from:  Patient Demographic factors:  NA Current Mental Status:  Suicidal ideation indicated by patient Loss Factors:  NA Historical Factors:  NA Risk Reduction Factors:  Sense of responsibility to family, Living with another person, especially a relative, Positive social support  Total Time spent with patient: 45 minutes Principal Problem: Alcohol Dependence, Alcohol Induced Mood Disorder versus Bipolar Disorder, Depressed  Diagnosis:  Active Problems:   Bipolar 1 disorder ([place])  Subjective Data:   Continued Clinical Symptoms:  Alcohol Use Disorder Identification Test Final Score (AUDIT): 34 The "Alcohol Use Disorders Identification Test", Guidelines for Use in Primary Care, Second Edition.  World Pharmacologist [facility]). Score between 0-7:  no or low risk or alcohol related problems. Score between 8-15:  moderate risk of alcohol related problems. Score between 16-19:  high risk of alcohol related problems. Score 20 or above:  warrants further diagnostic evaluation for alcohol dependence and treatment.   CLINICAL FACTORS:  43 year old female, presented to ED following reported overdose on Hydroxyzine. Patient denies suicide attempt, states she took three tablets in an attempt to relax. She does endorse depression, neuro-vegetative symptoms in the context of loss of loved ones and alcohol use disorder, but denies recent SI. She has a history of alcohol dependence and reports recent relapse, has been drinking 6 beers per day . Admission BAL 345.  Psychiatric Specialty Exam: Physical Exam  ROS  Blood pressure 121/78, pulse 96, temperature 98.9 F (37.2 C), temperature source Oral, resp. rate 18, height 5' 3.5" (1.613 m), weight 70.3 kg, last menstrual period 10/19/2018, SpO2 97 %.Body mass index is 27.03 kg/m.  See admit note MSE   COGNITIVE FEATURES THAT CONTRIBUTE TO RISK:  Closed-mindedness and Loss of  executive function    SUICIDE RISK:   Moderate:  Frequent suicidal ideation with limited intensity, and duration, some specificity in terms of plans, no associated intent, good self-control, limited dysphoria/symptomatology, some risk factors present, and identifiable protective factors, including available and accessible social support.  PLAN OF CARE: Patient will be admitted to inpatient psychiatric unit for stabilization and safety. Will provide and encourage milieu participation. Provide medication management and maked adjustments as needed. Will also provide medication management to address alcohol WDL - Will follow daily.    I certify that inpatient services furnished can reasonably be expected to improve the patient's condition.   Jenne Campus, MD 04/20/2019, 2:40 PM
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
## 2019-04-20 NOTE — Plan of Care (Signed)
  Problem: Education: Goal: Knowledge of disease or condition will improve Outcome: Progressing Goal: Understanding of discharge needs will improve Outcome: Progressing
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
## 2019-04-20 NOTE — Progress Notes (Signed)
[place] Group Notes:  (Nursing/MHT/Case Management/Adjunct)  Date:  04/20/2019  Time:  2030  Type of Therapy:  wrap up group  Participation Level:  Active  Participation Quality:  Appropriate, Attentive, Sharing and Supportive  Affect:  Depressed  Cognitive:  Appropriate  Insight:  Improving  Engagement in Group:  Engaged  Modes of Intervention:  Clarification, Education and Support  Summary of Progress/Problems: Pt reports getting some sleep. One thing pt is going to do differently when she goes home is have more patience with herself in regards to her cravings and family. Pt is grateful for waking up this morning.   [name] 04/20/2019, 10:50 PM

## 2019-04-20 NOTE — [facility] Group Notes (Signed)
LCSW Group Therapy Note  04/20/2019       Type of Therapy and Topic:  Group Therapy: "My Mental Health"  Participation Level:  Active   Description of Group:   In this group, patients were asked four questions in order to generate discussion around the idea of mental illness and/or substance addiction being medical problems: 1. In one sentence describe the current state of your mental health or substance use. 2. How much do you feel similar to or different from others? 3. Do you tend to identify with other people or compare yourself to them?  4. In a word or sentence, share what you desire your mental health/substance use to be.  Discussion was held that led to the conclusion that comparing ourselves to others is not healthy, but identifying with the elements of their issues that are similar to ours is helpful.    Therapeutic Goals: 1. Patients will identify their feelings about their current mental health/substance use problems. 2. Patients will describe how they feel similar to or different from others, and whether they tend to identify with or compare themselves to other people with the same issues. 3. Patients will explore the differences in these concepts and how a change of mindset about mental health/substance use can help with reaching recovery goals. 4. Patients will think about and share what their recovery goals are, in terms of mental health and/or substance use.  Summary of Patient Progress:  The patient shared that her current state of substance is alcohol use the last 2-3 months off and on.  She said she feels different from her family because she is the only one who drinks.  She wants to  "Not have to worry about drinking again"  in the future.  Therapeutic Modalities:   Processing Motivation Interviewing Psychoeducation  Maretta Los, MSW, LCSW 5:51 PM  .

## 2019-04-20 NOTE — Progress Notes (Signed)
D Pt is observed sleeping in her bed this am. She arouses when this writer calls her name, she willingly comes to the 300 hall med room and takes her scheduled meds as planned. She is depressed, she does not forward anything to this Probation officer. SHe looks down and away, avoiding making eye contact.     A She did complete her daily assessment and on this she wrote she deneid SI today and she rated her depression, hopelessness and anxeity " 10/7/ /", respectively.     R Safeyt in place.

## 2019-04-20 NOTE — Plan of Care (Signed)
  Problem: Education: Goal: Knowledge of disease or condition will improve 04/20/2019 0736 by Sharma Covert, RN Outcome: Progressing 04/20/2019 0730 by Sharma Covert, RN Outcome: Progressing Goal: Understanding of discharge needs will improve 04/20/2019 0736 by Sharma Covert, RN Outcome: Progressing 04/20/2019 0730 by Sharma Covert, RN Outcome: Progressing

## 2019-04-20 NOTE — [facility] Group Notes (Signed)
Adult Psychoeducational Group Note  Date:  04/20/2019 Time:  1:00 PM  Group Topic/Focus: Life Skills  Facilitated By: Vira Browns, RN  Participation Level:  Active  Participation Quality:  Appropriate and Attentive  Affect:  Appropriate  Cognitive:  Alert and Oriented  Insight: Improving  Engagement in Group:  Developing/Improving  Modes of Intervention:  Discussion and Socialization  Additional Comments:    Annia Friendly 04/20/2019, 2:00 PM

## 2019-04-20 NOTE — Progress Notes (Signed)
Adult Psychoeducational Group Note  Date:  04/20/2019 Time:  1300  Group Topic/Focus:  Identifying Needs:   The focus of this group is to help patients identify their personal needs that have been historically problematic and identify healthy behaviors to address their needs.  Participation Level:  Minimal  Participation Quality:  Attentive  Affect:  Anxious  Cognitive:  Alert  Insight: Limited  Engagement in Group:  Lacking  Modes of Intervention:  Education  Additional Comments:    Lauralyn Primes 04/20/2019, 6:48 PM

## 2019-04-20 NOTE — Progress Notes (Signed)
[place] NOVEL CORONAVIRUS (COVID-19) DAILY CHECK-OFF SYMPTOMS - answer yes or no to each - every day NO YES  Have you had a fever in the past 24 hours?  . Fever (Temp > 37.80C / 100F) X   Have you had any of these symptoms in the past 24 hours? . New Cough .  Sore Throat  .  Shortness of Breath .  Difficulty Breathing .  Unexplained Body Aches   X   Have you had any one of these symptoms in the past 24 hours not related to allergies?   . Runny Nose .  Nasal Congestion .  Sneezing   X   If you have had runny nose, nasal congestion, sneezing in the past 24 hours, has it worsened?  X   EXPOSURES - check yes or no X   Have you traveled outside the state in the past 14 days?  X   Have you been in contact with someone with a confirmed diagnosis of COVID-19 or PUI in the past 14 days without wearing appropriate PPE?  X   Have you been living in the same home as a person with confirmed diagnosis of COVID-19 or a PUI (household contact)?    X   Have you been diagnosed with COVID-19?    X              What to do next: Answered NO to all: Answered YES to anything:   Proceed with unit schedule Follow the [facility] Inpatient Flowsheet.   

## 2019-04-21 LAB — LIPID PANEL
Cholesterol: 129 mg/dL (ref 0–200)
HDL: 44 mg/dL (ref >40)
LDL Cholesterol: 76 mg/dL (ref 0–99)
Total CHOL/HDL Ratio: 2.9 RATIO
Triglycerides: 46 mg/dL (ref <150)
VLDL: 9 mg/dL (ref 0–40)

## 2019-04-21 LAB — HEMOGLOBIN A1C
Hgb A1c MFr Bld: 4.6 % — ABNORMAL LOW (ref 4.8–5.6)
Mean Plasma Glucose: 85.32 mg/dL

## 2019-04-21 NOTE — Progress Notes (Signed)
[place] Group Notes:  (Nursing/MHT/Case Management/Adjunct)  Date:  04/21/2019  Time:  2030  Type of Therapy:  wrap up group  Participation Level:  Active  Participation Quality:  Appropriate, Attentive, Sharing and Supportive  Affect:  Depressed and Flat  Cognitive:  Appropriate  Insight:  Improving  Engagement in Group:  Engaged  Modes of Intervention:  Clarification, Education and Support  Summary of Progress/Problems: Pt feels like she is getting better. It was challenging for her to eat the food today. And one thing she likes about herself is that she is a good listener and observer.   Sara [name] 04/21/2019, 10:33 PM

## 2019-04-21 NOTE — Progress Notes (Signed)
[place] NOVEL CORONAVIRUS (COVID-19) DAILY CHECK-OFF SYMPTOMS - answer yes or no to each - every day NO YES  Have you had a fever in the past 24 hours?  . Fever (Temp > 37.80C / 100F) X   Have you had any of these symptoms in the past 24 hours? . New Cough .  Sore Throat  .  Shortness of Breath .  Difficulty Breathing .  Unexplained Body Aches   X   Have you had any one of these symptoms in the past 24 hours not related to allergies?   . Runny Nose .  Nasal Congestion .  Sneezing   X   If you have had runny nose, nasal congestion, sneezing in the past 24 hours, has it worsened?  X   EXPOSURES - check yes or no X   Have you traveled outside the state in the past 14 days?  X   Have you been in contact with someone with a confirmed diagnosis of COVID-19 or PUI in the past 14 days without wearing appropriate PPE?  X   Have you been living in the same home as a person with confirmed diagnosis of COVID-19 or a PUI (household contact)?    X   Have you been diagnosed with COVID-19?    X              What to do next: Answered NO to all: Answered YES to anything:   Proceed with unit schedule Follow the [facility] Inpatient Flowsheet.   

## 2019-04-21 NOTE — Progress Notes (Signed)
   Participation Level: Psychoeducational Group Note  Date:  04/21/2019 Time:  1300   Group Topic/Focus:  Identifying Needs:   The focus of this group is to help patients identify their personal needs that have been historically problematic and identify healthy behaviors to address their needs.  Participation Level: Did Not Attend  Participation Quality:  Not Applicable  Affect:  Not Applicable  Cognitive:  Not Applicable  Insight:  Not Applicable  Engagement in Group: Not Applicable  Additional Comments:    Dannielle Burn 04/21/2019, 2:25 PM

## 2019-04-21 NOTE — Progress Notes (Signed)
D.  Pt pleasant on approach, no complaints voiced at this time.  Pt was positive for evening wrap up group, observed engaged in appropriate interaction with peers on the unit.  Pt denies SI/HI/AVH at this time.  A.  Support and encouragement offered, medication given as ordered  R.  Pt remains safe on the unit, will continue to monitor.

## 2019-04-21 NOTE — [facility] Group Notes (Signed)
[place] LCSW Group Therapy Note  04/21/2019    Type of Therapy and Topic:  Group Therapy:  Adding Supports Including Yourself  Participation Level:  Active   Description of Group:  Patients in this group were introduced to the concept that additional supports including self-support are an essential part of recovery.  Patients listed what supports they believe they need to add to their lives to achieve their goals at discharge, and they listed such things as therapist, family, doctor, support groups, and service dog.   A song entitled "My Own Hero" was played and a group discussion ensued in which patients stated they could relate to the song and it inspired them to realize they have be willing to help themselves in order to succeed, because other people cannot achieve sobriety or stability for them.  "Fight Jerl Santos" was played, then "Fight For It" to encourage patients.  They discussed the impact on them and how they must remain convinced that their lives are worth the effort it takes to become sober and/or stable.  Therapeutic Goals: 1)  demonstrate the importance of being a key part of one's own support system 2)  discuss various available supports 3)  encourage patient to use music as part of their self-support and focus on goals 4)  elicit ideas from patients about supports that need to be added   Summary of Patient Progress:  The patient expressed herself throughout group although she said she was feeling sedated.  She was attentive when not talking.   Therapeutic Modalities:   Motivational Interviewing Activity  Maretta Los  12:38 PM

## 2019-04-21 NOTE — Progress Notes (Signed)
[facility] MD Progress Note  04/21/2019 1:23 PM Sara [name]  MRN:  [id number] Subjective: Patient reports she is feeling better today.  Today denies suicidal ideations.  Denies medication side effects at this time. Objective: I discussed case with treatment team and have met with patient. 43 year old female, presented to ED following reported overdose on Hydroxyzine. Patient denies suicide attempt, states she took three tablets in an attempt to relax. She does endorse depression, neuro-vegetative symptoms in the context of loss of loved ones and alcohol use disorder, but denies recent SI. She has a history of alcohol dependence and reports recent relapse, has been drinking 6 beers per day . Admission BAL 345.  Today patient presents with some improvement compared to admission.  She states she feels better.  She presents vaguely constricted in affect but describes improving mood.  Denies suicidal ideations.  Currently does not endorse significant/severe withdrawal symptoms, and does not appear to be in any acute distress or discomfort.  Remained comfortably seated during session.  Mild distal tremors.  No diaphoresis.  Vitals currently stable.  BP 109/80, pulse 64. No disruptive or agitated behaviors on unit, polite on approach, visible in dayroom but limited interaction with peers. Labs reviewed-lipid panel unremarkable, hemoglobin A1c 4.6  Of note, patient has history of pancytopenia. Most recent CBC shows improvement.  WBC improved to 3.1 from 2.0, Hgb improved to 10.2 from 8.6, platelet count improved to 68 from 61.  Principal Problem:  Alcohol Dependence, Alcohol Induced Mood Disorder Diagnosis: Active Problems:   Bipolar 1 disorder (HCC)  Total Time spent with patient: 20 minutes  Past Psychiatric History:   Past Medical History:  Past Medical History:  Diagnosis Date  . Alcohol abuse   . Alcoholism ([place])   . Anemia   . Anxiety   . Blood transfusion without reported diagnosis   .  Cirrhosis ([place])   . Depression   . 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 GERD (gastroesophageal reflux disease)   . Heart murmur    Patient states she may have  . Menorrhagia   . Pancytopenia ([place]) 01/15/2014  . Pneumonia   . Portal hypertension ([place])   . S/P alcohol detoxification    2-3 days at behavioral health previously  . UGI bleed 06/12/2014    Past Surgical History:  Procedure Laterality Date  . CHOLECYSTECTOMY    . ESOPHAGOGASTRODUODENOSCOPY N/A 06/12/2014   Procedure: ESOPHAGOGASTRODUODENOSCOPY (EGD);  Surgeon: Gatha Mayer, MD;  Location: [facility];  Service: Endoscopy;  Laterality: N/A;  . ESOPHAGOGASTRODUODENOSCOPY (EGD) WITH PROPOFOL N/A 07/29/2014   Procedure: ESOPHAGOGASTRODUODENOSCOPY (EGD) WITH PROPOFOL;  Surgeon: Inda Castle, MD;  Location: [facility] END[facility]COPY;  Service: Endoscopy;  Laterality: N/A;  . ESOPHAGOGASTRODUODENOSCOPY (EGD) WITH PROPOFOL N/A 01/20/2018   Procedure: ESOPHAGOGASTRODUODENOSCOPY (EGD) WITH PROPOFOL;  Surgeon: Mauri Pole, MD;  Location: [facility] ENDOSCOPY;  Service: Endoscopy;  Laterality: N/A;   Family History:  Family History  Problem Relation Age of Onset  . Colon polyps Mother   . Hypertension Mother   . Thyroid disease Mother   . Alcoholism Mother   . Alcoholism Father   . Alcohol abuse Maternal Grandfather   . Alcohol abuse Paternal Grandfather   . Alcohol abuse Paternal Aunt   . Alcohol abuse Maternal Uncle    Family Psychiatric  History:  Social History:  Social History   Substance and Sexual Activity  Alcohol Use Yes  . Alcohol/week: 0.0 standard drinks   Comment: Usually drinks 2-3 bottles of wine daily  when drinking.      Social History   Substance and Sexual Activity  Drug Use No  . Types: Cocaine, Marijuana   Comment: denies    Social History   Socioeconomic History  . Marital status: Married    Spouse name: [name]  . Number of children: 2  . Years of education: Associates  . Highest  education level: Not on file  Occupational History  . Occupation: Radio broadcast assistant  Social Needs  . Financial resource strain: Not on file  . Food insecurity:    Worry: Not on file    Inability: Not on file  . Transportation needs:    Medical: Not on file    Non-medical: Not on file  Tobacco Use  . Smoking status: Current Every Day Smoker    Packs/day: 0.25    Types: Cigarettes  . Smokeless tobacco: Never Used  Substance and Sexual Activity  . Alcohol use: Yes    Alcohol/week: 0.0 standard drinks    Comment: Usually drinks 2-3 bottles of wine daily when drinking.   . Drug use: No    Types: Cocaine, Marijuana    Comment: denies  . Sexual activity: Never    Birth control/protection: None  Lifestyle  . Physical activity:    Days per week: Not on file    Minutes per session: Not on file  . Stress: Not on file  Relationships  . Social connections:    Talks on phone: Not on file    Gets together: Not on file    Attends religious service: Not on file    Active member of club or organization: Not on file    Attends meetings of clubs or organizations: Not on file    Relationship status: Not on file  Other Topics Concern  . Not on file  Social History Narrative   Lives with husband and son. Daughter lives nearby. 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   Additional Social History:    Pain Medications: see mar Prescriptions: see mar Over the Counter: see mar Longest period of sobriety (when/how long): binge drinking Negative Consequences of Use: Personal relationships, Financial Withdrawal Symptoms: Patient aware of relationship between substance abuse and physical/medical complications Name of Substance 1: Alcohol 1 - Age of First Use: UTA 1 - Amount (size/oz): binge drinking on several bottles of wine 1 - Frequency: weekly 1 - Duration: weekly 1 - Last Use / Amount: 04/19/2019  Sleep: Fair/improving  Appetite:  Improving  Current  Medications: Current Facility-Administered Medications  Medication Dose Route Frequency Provider Last Rate Last Dose  . acetaminophen (TYLENOL) tablet 650 mg  650 mg Oral Q6H PRN Ethelene Hal, NP      . alum & mag hydroxide-simeth (MAALOX/MYLANTA) 200-200-20 MG/5ML suspension 30 mL  30 mL Oral Q4H PRN Ethelene Hal, NP      . escitalopram (LEXAPRO) tablet 10 mg  10 mg Oral Daily Ethelene Hal, NP   10 mg at 04/21/19 0827  . hydrOXYzine (ATARAX/VISTARIL) tablet 25 mg  25 mg Oral Q6H PRN Laverle Hobby, PA-C   25 mg at 04/20/19 2132  . loperamide (IMODIUM) capsule 2-4 mg  2-4 mg Oral PRN Laverle Hobby, PA-C      . LORazepam (ATIVAN) tablet 1 mg  1 mg Oral Q6H PRN Laverle Hobby, PA-C   1 mg at 04/19/19 2222  . LORazepam (ATIVAN) tablet 1 mg  1 mg Oral TID Laverle Hobby, PA-C  1 mg at 04/21/19 1244   Followed by  . [START ON 04/22/2019] LORazepam (ATIVAN) tablet 1 mg  1 mg Oral BID Patriciaann Clan E, PA-C       Followed by  . [START ON 04/23/2019] LORazepam (ATIVAN) tablet 1 mg  1 mg Oral Daily Simon, Spencer E, PA-C      . magnesium hydroxide (MILK OF MAGNESIA) suspension 30 mL  30 mL Oral Daily PRN Ethelene Hal, NP      . multivitamin with minerals tablet 1 tablet  1 tablet Oral Daily Laverle Hobby, PA-C   1 tablet at 04/21/19 0827  . ondansetron (ZOFRAN-ODT) disintegrating tablet 4 mg  4 mg Oral Q6H PRN Patriciaann Clan E, PA-C      . potassium chloride (K-DUR) CR tablet 10 mEq  10 mEq Oral BID Cobos, Myer Peer, MD   10 mEq at 04/21/19 0827  . QUEtiapine (SEROQUEL) tablet 25 mg  25 mg Oral QHS Cobos, Myer Peer, MD   25 mg at 04/20/19 2132  . thiamine (B-1) injection 100 mg  100 mg Intramuscular Once Patriciaann Clan E, PA-C      . thiamine (VITAMIN B-1) tablet 100 mg  100 mg Oral Daily Patriciaann Clan E, PA-C   100 mg at 04/21/19 0827  . topiramate (TOPAMAX) tablet 25 mg  25 mg Oral BID Cobos, Myer Peer, MD   25 mg at 04/21/19 3500    Lab Results:   Results for orders placed or performed during the hospital encounter of 04/19/19 (from the past 48 hour(s))  Lipid panel     Status: None   Collection Time: 04/21/19  5:58 AM  Result Value Ref Range   Cholesterol 129 0 - 200 mg/dL   Triglycerides 46 <150 mg/dL   HDL 44 >40 mg/dL   Total CHOL/HDL Ratio 2.9 RATIO   VLDL 9 0 - 40 mg/dL   LDL Cholesterol 76 0 - 99 mg/dL    Comment:        Total Cholesterol/HDL:CHD Risk Coronary Heart Disease Risk Table                     Men   Women  1/2 Average Risk   3.4   3.3  Average Risk       5.0   4.4  2 X Average Risk   9.6   7.1  3 X Average Risk  23.4   11.0        Use the calculated Patient Ratio above and the CHD Risk Table to determine the patient's CHD Risk.        ATP III CLASSIFICATION (LDL):  <100     mg/dL   Optimal  100-129  mg/dL   Near or Above                    Optimal  130-159  mg/dL   Borderline  160-189  mg/dL   High  >190     mg/dL   Very High Performed at [facility] [address], [place], [place] [address]   Hemoglobin A1c     Status: Abnormal   Collection Time: 04/21/19  5:58 AM  Result Value Ref Range   Hgb A1c MFr Bld 4.6 (L) 4.8 - 5.6 %    Comment: (NOTE) Pre diabetes:          5.7%-6.4% Diabetes:              >6.4%  Glycemic control for   <7.0% adults with diabetes    Mean Plasma Glucose 85.32 mg/dL    Comment: Performed at [facility] [address] [place], [place] [address]    Blood Alcohol level:  Lab Results  Component Value Date   ETH 345 [facility]) 04/19/2019   ETH 337 (HH) 49/17/9150    Metabolic Disorder Labs: Lab Results  Component Value Date   HGBA1C 4.6 (L) 04/21/2019   MPG 85.32 04/21/2019   MPG 82 05/04/2017   Lab Results  Component Value Date   PROLACTIN 28.7 (H) 05/04/2017   Lab Results  Component Value Date   CHOL 129 04/21/2019   TRIG 46 04/21/2019   HDL 44 04/21/2019   CHOLHDL 2.9 04/21/2019   VLDL 9 04/21/2019   LDLCALC 76 04/21/2019    LDLCALC 110 (H) 05/04/2017    Physical Findings: AIMS: Facial and Oral Movements Muscles of Facial Expression: None, normal Lips and Perioral Area: None, normal Jaw: None, normal Tongue: None, normal,Extremity Movements Upper (arms, wrists, hands, fingers): None, normal Lower (legs, knees, ankles, toes): None, normal, Trunk Movements Neck, shoulders, hips: None, normal, Overall Severity Severity of abnormal movements (highest score from questions above): None, normal Incapacitation due to abnormal movements: None, normal Patient's awareness of abnormal movements (rate only patient's report): No Awareness, Dental Status Current problems with teeth and/or dentures?: No Does patient usually wear dentures?: No  CIWA:  CIWA-Ar Total: 2 COWS:     Musculoskeletal: Strength & Muscle Tone: within normal limits- subtle distal tremors, no restlessness or agitation Gait & Station: normal Patient leans: N/A  Psychiatric Specialty Exam: Physical Exam  ROS denies headache, denies visual disturbances, denies chest pain, no shortness of breath, no cough, no vomiting, no fever, no chills  Blood pressure 109/80, pulse 64, temperature 98.5 F (36.9 C), temperature source Oral, resp. rate 16, height 5' 3.5" (1.613 m), weight 70.3 kg, last menstrual period 10/19/2018, SpO2 98 %.Body mass index is 27.03 kg/m.  General Appearance: Improving grooming  Eye Contact:  Fair  Speech:  Normal Rate  Volume:  Normal  Mood:  Reports improving mood  Affect:  Vaguely constricted, improved during session, smiles at times appropriately during session  Thought Process:  Linear and Descriptions of Associations: Intact  Orientation:  Other:  Fully alert and attentive  Thought Content:  No hallucinations, no delusions  Suicidal Thoughts:  No today denies suicidal or self-injurious ideations, no homicidal or violent ideations  Homicidal Thoughts:  No  Memory:  Recent and remote grossly intact  Judgement:  Other:   Fair/improving  Insight:  Fair  Psychomotor Activity:  Normal-no psychomotor agitation, no restlessness  Concentration:  Concentration: Good and Attention Span: Good  Recall:  Good  Fund of Knowledge:  Good  Language:  Good  Akathisia:  Negative  Handed:  Right  AIMS (if indicated):     Assets:  Desire for Improvement Resilience  ADL's:  Intact  Cognition:  WNL  Sleep:  Number of Hours: 5.75   Assessment  43 year old female, presented to ED following reported overdose on Hydroxyzine. Patient denies suicide attempt, states she took three tablets in an attempt to relax. She does endorse depression, neuro-vegetative symptoms in the context of loss of loved ones and alcohol use disorder, but denies recent SI. She has a history of alcohol dependence and reports recent relapse, has been drinking 6 beers per day . Admission BAL 345.   Today patient presents with partially improved mood, reports she  is feeling better.  Denies suicidal ideations.  Vitals are stable. Tolerating Ativan detox protocol well thus far .     Treatment Plan Summary: Daily contact with patient to assess and evaluate symptoms and progress in treatment, Medication management, Plan inpatient treatment  and medications as below Encourage group and milieu participation to work on coping skills and symptom reduction Continue Ativan detox protocol to address alcohol withdrawal Continue Lexapro 10 mg daily for depression Continue Seroquel 25 mg nightly for mood disorder Continue Topamax 25 mg twice daily for mood disorder Treatment team working on disposition planning options  Jenne Campus, MD 04/21/2019, 1:23 PM

## 2019-04-21 NOTE — Plan of Care (Signed)
  Problem: Health Behavior/Discharge Planning: Goal: Ability to identify changes in lifestyle to reduce recurrence of condition will improve Outcome: Progressing Goal: Identification of resources available to assist in meeting health care needs will improve Outcome: Progressing   Problem: Physical Regulation: Goal: Complications related to the disease process, condition or treatment will be avoided or minimized Outcome: Progressing   

## 2019-04-21 NOTE — Progress Notes (Signed)
D Pt is observed standing at the med window this am at morning med pass. She wears her own clothese- that she has been wearing for 3 days - and sleeping in. Her dress is disheveled, she has poor hygiene and she says she will shower and staff will launder her clothes. Her affect is flat, she is reserved, distant and guarded. She has poor eye contaact. She forwards little to this Probation officer.     A She completed her daily assessment and on this she wrote she deneid SI today and she rated her depression, hoeplessness and anxiety " 3/0/5", respectively. Wrtier encouraged her to get out of her room today, to attend groups and to identify a daily goal.     R Safety is in place.

## 2019-04-22 DIAGNOSIS — F10229 Alcohol dependence with intoxication, unspecified: Secondary | ICD-10-CM

## 2019-04-22 MED ORDER — ESCITALOPRAM OXALATE 20 MG PO TABS
20.0000 mg | ORAL_TABLET | Freq: Every day | ORAL | Status: DC
  Administered 2019-04-23: 20 mg via ORAL
  Filled 2019-04-22 (×2): qty 1

## 2019-04-22 MED ORDER — QUETIAPINE FUMARATE 100 MG PO TABS
100.0000 mg | ORAL_TABLET | Freq: Every day | ORAL | Status: DC
  Administered 2019-04-22: 100 mg via ORAL
  Filled 2019-04-22 (×2): qty 1

## 2019-04-22 MED ORDER — TOPIRAMATE 25 MG PO TABS
50.0000 mg | ORAL_TABLET | Freq: Two times a day (BID) | ORAL | Status: DC
  Administered 2019-04-22 – 2019-04-23 (×2): 50 mg via ORAL
  Filled 2019-04-22 (×4): qty 2

## 2019-04-22 NOTE — Progress Notes (Signed)
[facility] MD Progress Note  04/22/2019 1:06 PM Sara [name]  MRN:  [id number] Subjective:   Patient minimizes all issues believing she does not need to be here and that she "might have been drinking" but her blood alcohol level was quite high, 345 on presentation drug screen showing benzodiazepines as well so she is still minimizing everything stating she is not suicidal that her husband overreacted he is on disability does not work" has nothing else to do" but to worry about her but again he is telling a completely different story than she is.  We will continue detox measures escalate antidepressants and monitor for withdrawal Principal Problem: <principal problem not specified> Diagnosis: Active Problems:   Bipolar 1 disorder (HCC)  Total Time spent with patient: 20 minutes  Past Medical History:  Past Medical History:  Diagnosis Date  . Alcohol abuse   . Alcoholism ([place])   . Anemia   . Anxiety   . Blood transfusion without reported diagnosis   . Cirrhosis ([place])   . Depression   . 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 GERD (gastroesophageal reflux disease)   . Heart murmur    Patient states she may have  . Menorrhagia   . Pancytopenia ([place]) 01/15/2014  . Pneumonia   . Portal hypertension ([place])   . S/P alcohol detoxification    2-3 days at behavioral health previously  . UGI bleed 06/12/2014    Past Surgical History:  Procedure Laterality Date  . CHOLECYSTECTOMY    . ESOPHAGOGASTRODUODENOSCOPY N/A 06/12/2014   Procedure: ESOPHAGOGASTRODUODENOSCOPY (EGD);  Surgeon: Gatha Mayer, MD;  Location: [facility];  Service: Endoscopy;  Laterality: N/A;  . ESOPHAGOGASTRODUODENOSCOPY (EGD) WITH PROPOFOL N/A 07/29/2014   Procedure: ESOPHAGOGASTRODUODENOSCOPY (EGD) WITH PROPOFOL;  Surgeon: Inda Castle, MD;  Location: [facility];  Service: Endoscopy;  Laterality: N/A;  . ESOPHAGOGASTRODUODENOSCOPY (EGD) WITH PROPOFOL N/A 01/20/2018   Procedure: ESOPHAGOGASTRODUODENOSCOPY (EGD)  WITH PROPOFOL;  Surgeon: Mauri Pole, MD;  Location: [facility];  Service: Endoscopy;  Laterality: N/A;   Family History:  Family History  Problem Relation Age of Onset  . Colon polyps Mother   . Hypertension Mother   . Thyroid disease Mother   . Alcoholism Mother   . Alcoholism Father   . Alcohol abuse Maternal Grandfather   . Alcohol abuse Paternal Grandfather   . Alcohol abuse Paternal Aunt   . Alcohol abuse Maternal Uncle    Family Psychiatric  History: neg Social History:  Social History   Substance and Sexual Activity  Alcohol Use Yes  . Alcohol/week: 0.0 standard drinks   Comment: Usually drinks 2-3 bottles of wine daily when drinking.      Social History   Substance and Sexual Activity  Drug Use No  . Types: Cocaine, Marijuana   Comment: denies    Social History   Socioeconomic History  . Marital status: Married    Spouse name: [name]  . Number of children: 2  . Years of education: Associates  . Highest education level: Not on file  Occupational History  . Occupation: Radio broadcast assistant  Social Needs  . Financial resource strain: Not on file  . Food insecurity:    Worry: Not on file    Inability: Not on file  . Transportation needs:    Medical: Not on file    Non-medical: Not on file  Tobacco Use  . Smoking status: Current Every Day Smoker    Packs/day: 0.25    Types: Cigarettes  . Smokeless tobacco: Never  Used  Substance and Sexual Activity  . Alcohol use: Yes    Alcohol/week: 0.0 standard drinks    Comment: Usually drinks 2-3 bottles of wine daily when drinking.   . Drug use: No    Types: Cocaine, Marijuana    Comment: denies  . Sexual activity: Never    Birth control/protection: None  Lifestyle  . Physical activity:    Days per week: Not on file    Minutes per session: Not on file  . Stress: Not on file  Relationships  . Social connections:    Talks on phone: Not on file    Gets together: Not on file    Attends religious service:  Not on file    Active member of club or organization: Not on file    Attends meetings of clubs or organizations: Not on file    Relationship status: Not on file  Other Topics Concern  . Not on file  Social History Narrative   Lives with husband and son. Daughter lives nearby. 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   Additional Social History:    Pain Medications: see mar Prescriptions: see mar Over the Counter: see mar Longest period of sobriety (when/how long): binge drinking Negative Consequences of Use: Personal relationships, Financial Withdrawal Symptoms: Patient aware of relationship between substance abuse and physical/medical complications Name of Substance 1: Alcohol 1 - Age of First Use: UTA 1 - Amount (size/oz): binge drinking on several bottles of wine 1 - Frequency: weekly 1 - Duration: weekly 1 - Last Use / Amount: 04/19/2019                  Sleep: Good  Appetite:  Good  Current Medications: Current Facility-Administered Medications  Medication Dose Route Frequency Provider Last Rate Last Dose  . acetaminophen (TYLENOL) tablet 650 mg  650 mg Oral Q6H PRN Ethelene Hal, NP      . alum & mag hydroxide-simeth (MAALOX/MYLANTA) 200-200-20 MG/5ML suspension 30 mL  30 mL Oral Q4H PRN Ethelene Hal, NP      . Derrill Memo ON 04/23/2019] escitalopram (LEXAPRO) tablet 20 mg  20 mg Oral Daily Johnn Hai, MD      . hydrOXYzine (ATARAX/VISTARIL) tablet 25 mg  25 mg Oral Q6H PRN Patriciaann Clan E, PA-C   25 mg at 04/21/19 2138  . loperamide (IMODIUM) capsule 2-4 mg  2-4 mg Oral PRN Laverle Hobby, PA-C      . LORazepam (ATIVAN) tablet 1 mg  1 mg Oral Q6H PRN Laverle Hobby, PA-C   1 mg at 04/21/19 2139  . LORazepam (ATIVAN) tablet 1 mg  1 mg Oral BID Laverle Hobby, PA-C   1 mg at 04/22/19 0818   Followed by  . [START ON 04/23/2019] LORazepam (ATIVAN) tablet 1 mg  1 mg Oral Daily Simon, Spencer E, PA-C      . magnesium hydroxide  (MILK OF MAGNESIA) suspension 30 mL  30 mL Oral Daily PRN Ethelene Hal, NP   30 mL at 04/22/19 6270  . multivitamin with minerals tablet 1 tablet  1 tablet Oral Daily Laverle Hobby, PA-C   1 tablet at 04/22/19 0817  . ondansetron (ZOFRAN-ODT) disintegrating tablet 4 mg  4 mg Oral Q6H PRN Patriciaann Clan E, PA-C      . QUEtiapine (SEROQUEL) tablet 100 mg  100 mg Oral QHS Johnn Hai, MD      . thiamine (B-1) injection 100 mg  100  mg Intramuscular Once Laverle Hobby, PA-C      . thiamine (VITAMIN B-1) tablet 100 mg  100 mg Oral Daily Patriciaann Clan E, PA-C   100 mg at 04/22/19 0817  . topiramate (TOPAMAX) tablet 50 mg  50 mg Oral BID Johnn Hai, MD        Lab Results:  Results for orders placed or performed during the hospital encounter of 04/19/19 (from the past 48 hour(s))  Lipid panel     Status: None   Collection Time: 04/21/19  5:58 AM  Result Value Ref Range   Cholesterol 129 0 - 200 mg/dL   Triglycerides 46 <150 mg/dL   HDL 44 >40 mg/dL   Total CHOL/HDL Ratio 2.9 RATIO   VLDL 9 0 - 40 mg/dL   LDL Cholesterol 76 0 - 99 mg/dL    Comment:        Total Cholesterol/HDL:CHD Risk Coronary Heart Disease Risk Table                     Men   Women  1/2 Average Risk   3.4   3.3  Average Risk       5.0   4.4  2 X Average Risk   9.6   7.1  3 X Average Risk  23.4   11.0        Use the calculated Patient Ratio above and the CHD Risk Table to determine the patient's CHD Risk.        ATP III CLASSIFICATION (LDL):  <100     mg/dL   Optimal  100-129  mg/dL   Near or Above                    Optimal  130-159  mg/dL   Borderline  160-189  mg/dL   High  >190     mg/dL   Very High Performed at [facility] [address], [place], [place] [address]   Hemoglobin A1c     Status: Abnormal   Collection Time: 04/21/19  5:58 AM  Result Value Ref Range   Hgb A1c MFr Bld 4.6 (L) 4.8 - 5.6 %    Comment: (NOTE) Pre diabetes:          5.7%-6.4% Diabetes:               >6.4% Glycemic control for   <7.0% adults with diabetes    Mean Plasma Glucose 85.32 mg/dL    Comment: Performed at [facility] [address], [place], [place] [address]    Blood Alcohol level:  Lab Results  Component Value Date   ETH 345 [facility]) 04/19/2019   ETH 337 (HH) 11/23/7251    Metabolic Disorder Labs: Lab Results  Component Value Date   HGBA1C 4.6 (L) 04/21/2019   MPG 85.32 04/21/2019   MPG 82 05/04/2017   Lab Results  Component Value Date   PROLACTIN 28.7 (H) 05/04/2017   Lab Results  Component Value Date   CHOL 129 04/21/2019   TRIG 46 04/21/2019   HDL 44 04/21/2019   CHOLHDL 2.9 04/21/2019   VLDL 9 04/21/2019   LDLCALC 76 04/21/2019   LDLCALC 110 (H) 05/04/2017    Physical Findings: AIMS: Facial and Oral Movements Muscles of Facial Expression: None, normal Lips and Perioral Area: None, normal Jaw: None, normal Tongue: None, normal,Extremity Movements Upper (arms, wrists, hands, fingers): None, normal Lower (legs, knees, ankles, toes): None, normal, Trunk Movements  Neck, shoulders, hips: None, normal, Overall Severity Severity of abnormal movements (highest score from questions above): None, normal Incapacitation due to abnormal movements: None, normal Patient's awareness of abnormal movements (rate only patient's report): No Awareness, Dental Status Current problems with teeth and/or dentures?: No Does patient usually wear dentures?: No  CIWA:  CIWA-Ar Total: 1 COWS:     Musculoskeletal: Strength & Muscle Tone: within normal limits Gait & Station: normal Patient leans: N/A  Psychiatric Specialty Exam: Physical Exam  ROS  Blood pressure 96/75, pulse 95, temperature 97.7 F (36.5 C), resp. rate 16, height 5' 3.5" (1.613 m), weight 70.3 kg, last menstrual period 10/19/2018, SpO2 95 %.Body mass index is 27.03 kg/m.  General Appearance: Casual  Eye Contact:  Fair  Speech:  Clear and Coherent  Volume:  Decreased  Mood:  Dysphoric   Affect:  Flat   Thought Process:  Coherent  Orientation:  Full (Time, Place, and Person)  Thought Content:  Rumination and Intact associations no psychosis  Suicidal Thoughts:  No  Homicidal Thoughts:  No  Memory:  Immediate;   Fair  Judgement:  Fair  Insight:  Shallow  Psychomotor Activity:  Normal  Concentration:  Concentration: Fair  Recall:  [employer] of Knowledge:  Fair  Language:  Negative  Akathisia:  Negative  Handed:  Right  AIMS (if indicated):     Assets:  Communication Skills Desire for Improvement  ADL's:  Intact  Cognition:  WNL  Sleep:  Number of Hours: 4.25     Treatment Plan Summary: Daily contact with patient to assess and evaluate symptoms and progress in treatment, Medication management, Plan Patient minimizes so were going to increase Lexapro continue to gather history continue to monitor for withdrawal continue to discuss with team may discharge in 24 to 48 hours and Escalate the quetiapine for sleep  Johnn Hai, MD 04/22/2019, 1:06 PM

## 2019-04-22 NOTE — Progress Notes (Signed)
Patient ID: Sara [name], female   DOB: 08/22/1976, 43 y.o.   MRN: [id number]  Nursing Progress Note [id number]  On initial approach, patient is seen sitting up in the milieu. Patient presents with flat affect and is soft spoken but endorses anxiety. Patient does not appear to be having significant withdrawal. Patient compliant with scheduled medications. Patient is seen attending groups and visible in the milieu. Patient currently denies SI/HI/AVH. Patient is minimally during interactions but denies concerns.  Patient is educated about and provided medication per provider's orders. Patient safety maintained with q15 min safety checks and high fall risk precautions. Emotional support given, 1:1 interaction, and active listening provided. Patient encouraged to attend meals, groups, and work on treatment plan and goals. Labs, vital signs and patient behavior monitored throughout shift.   Patient contracts for safety with staff. Patient remains safe on the unit at this time and agrees to come to staff with any issues/concerns. Patient is interacting with peers appropriately on the unit. Will continue to support and monitor.   Patient's self-inventory sheet Rated Energy Level  Normal  Rated Sleep  Fair  Rated Appetite  Good  Rated Anxiety (0-10)  0  Rated Hopelessness (0-10)  0  Rated Depression (0-10)  6  Daily Goal  "work on discharge plan"  Any Additional Comments:

## 2019-04-22 NOTE — Tx Team (Signed)
Interdisciplinary Treatment and Diagnostic Plan Update  04/22/2019 Time of Session: 9:48am Sara [name] MRN: [id number]  Principal Diagnosis: <principal problem not specified>  Secondary Diagnoses: Active Problems:   Bipolar 1 disorder (HCC)   Current Medications:  Current Facility-Administered Medications  Medication Dose Route Frequency Provider Last Rate Last Dose  . acetaminophen (TYLENOL) tablet 650 mg  650 mg Oral Q6H PRN Ethelene Hal, NP      . alum & mag hydroxide-simeth (MAALOX/MYLANTA) 200-200-20 MG/5ML suspension 30 mL  30 mL Oral Q4H PRN Ethelene Hal, NP      . Derrill Memo ON 04/23/2019] escitalopram (LEXAPRO) tablet 20 mg  20 mg Oral Daily Johnn Hai, MD      . hydrOXYzine (ATARAX/VISTARIL) tablet 25 mg  25 mg Oral Q6H PRN Patriciaann Clan E, PA-C   25 mg at 04/21/19 2138  . loperamide (IMODIUM) capsule 2-4 mg  2-4 mg Oral PRN Laverle Hobby, PA-C      . LORazepam (ATIVAN) tablet 1 mg  1 mg Oral Q6H PRN Laverle Hobby, PA-C   1 mg at 04/21/19 2139  . LORazepam (ATIVAN) tablet 1 mg  1 mg Oral BID Laverle Hobby, PA-C   1 mg at 04/22/19 0818   Followed by  . [START ON 04/23/2019] LORazepam (ATIVAN) tablet 1 mg  1 mg Oral Daily Simon, Spencer E, PA-C      . magnesium hydroxide (MILK OF MAGNESIA) suspension 30 mL  30 mL Oral Daily PRN Ethelene Hal, NP   30 mL at 04/22/19 4656  . multivitamin with minerals tablet 1 tablet  1 tablet Oral Daily Laverle Hobby, PA-C   1 tablet at 04/22/19 0817  . ondansetron (ZOFRAN-ODT) disintegrating tablet 4 mg  4 mg Oral Q6H PRN Laverle Hobby, PA-C      . QUEtiapine (SEROQUEL) tablet 100 mg  100 mg Oral QHS Johnn Hai, MD      . thiamine (B-1) injection 100 mg  100 mg Intramuscular Once Patriciaann Clan E, PA-C      . thiamine (VITAMIN B-1) tablet 100 mg  100 mg Oral Daily Patriciaann Clan E, PA-C   100 mg at 04/22/19 0817  . topiramate (TOPAMAX) tablet 50 mg  50 mg Oral BID Johnn Hai, MD       PTA  Medications: Medications Prior to Admission  Medication Sig Dispense Refill Last Dose  . QUEtiapine (SEROQUEL) 25 MG tablet Take 25 mg by mouth at bedtime.     . topiramate (TOPAMAX) 25 MG tablet Take 25 mg by mouth 2 (two) times daily.     . chlordiazePOXIDE (LIBRIUM) 5 MG capsule Please dispense 18 pills - Take 1 pill three times a day for 3 days, then Take 1 pill two times a day for 3 days, then Take 1 pill once a day for 3 days and stop. (Patient not taking: Reported on 04/19/2019) 18 capsule 0 Not Taking at Unknown time  . escitalopram (LEXAPRO) 10 MG tablet Take 1 tablet (10 mg total) by mouth daily. 30 tablet 2 04/18/2019 at Unknown time  . hydrOXYzine (ATARAX/VISTARIL) 25 MG tablet Take 25 mg by mouth 3 (three) times daily as needed for anxiety.   04/19/2019 at Unknown time  . lactulose (CHRONULAC) 10 GM/15ML solution Take 15 mLs (10 g total) by mouth 3 (three) times daily. (Patient not taking: Reported on 04/01/2019) 240 mL 0 Not Taking at Unknown time  . lidocaine (XYLOCAINE) 2 % solution Use as directed 15 mLs in  the mouth or throat as needed for mouth pain. (Patient not taking: Reported on 04/19/2019) 200 mL 0 Not Taking at Unknown time  . polyvinyl alcohol (LIQUIFILM TEARS) 1.4 % ophthalmic solution Place 1 drop into both eyes as needed for dry eyes.   04/18/2019 at Unknown time  . thiamine (VITAMIN B-1) 100 MG tablet Take 1 tablet (100 mg total) by mouth daily. (Patient not taking: Reported on 04/01/2019) 30 tablet 0 Not Taking at Unknown time  . VITAMIN D PO Take 1 capsule by mouth daily.   04/18/2019 at Unknown time    Patient Stressors: Health problems Substance abuse  Patient Strengths: Ability for insight Average or above average intelligence General fund of knowledge  Treatment Modalities: Medication Management, Group therapy, Case management,  1 to 1 session with clinician, Psychoeducation, Recreational therapy.   Physician Treatment Plan for Primary Diagnosis: <principal  problem not specified> Long Term Goal(s): Improvement in symptoms so as ready for discharge Improvement in symptoms so as ready for discharge   Short Term Goals: Ability to identify changes in lifestyle to reduce recurrence of condition will improve Ability to verbalize feelings will improve Ability to disclose and discuss suicidal ideas Ability to demonstrate self-control will improve Ability to identify and develop effective coping behaviors will improve Ability to maintain clinical measurements within normal limits will improve Ability to identify changes in lifestyle to reduce recurrence of condition will improve Ability to verbalize feelings will improve Ability to disclose and discuss suicidal ideas Ability to demonstrate self-control will improve Ability to identify and develop effective coping behaviors will improve Ability to maintain clinical measurements within normal limits will improve  Medication Management: Evaluate patient's response, side effects, and tolerance of medication regimen.  Therapeutic Interventions: 1 to 1 sessions, Unit Group sessions and Medication administration.  Evaluation of Outcomes: Progressing  Physician Treatment Plan for Secondary Diagnosis: Active Problems:   Bipolar 1 disorder ([place])  Long Term Goal(s): Improvement in symptoms so as ready for discharge Improvement in symptoms so as ready for discharge   Short Term Goals: Ability to identify changes in lifestyle to reduce recurrence of condition will improve Ability to verbalize feelings will improve Ability to disclose and discuss suicidal ideas Ability to demonstrate self-control will improve Ability to identify and develop effective coping behaviors will improve Ability to maintain clinical measurements within normal limits will improve Ability to identify changes in lifestyle to reduce recurrence of condition will improve Ability to verbalize feelings will improve Ability to disclose  and discuss suicidal ideas Ability to demonstrate self-control will improve Ability to identify and develop effective coping behaviors will improve Ability to maintain clinical measurements within normal limits will improve     Medication Management: Evaluate patient's response, side effects, and tolerance of medication regimen.  Therapeutic Interventions: 1 to 1 sessions, Unit Group sessions and Medication administration.  Evaluation of Outcomes: Progressing   RN Treatment Plan for Primary Diagnosis: <principal problem not specified> Long Term Goal(s): Knowledge of disease and therapeutic regimen to maintain health will improve  Short Term Goals: Ability to participate in decision making will improve, Ability to verbalize feelings will improve, Ability to disclose and discuss suicidal ideas, Ability to identify and develop effective coping behaviors will improve and Compliance with prescribed medications will improve  Medication Management: RN will administer medications as ordered by provider, will assess and evaluate patient's response and provide education to patient for prescribed medication. RN will report any adverse and/or side effects to prescribing provider.  Therapeutic Interventions: 1  on 1 counseling sessions, Psychoeducation, Medication administration, Evaluate responses to treatment, Monitor vital signs and CBGs as ordered, Perform/monitor CIWA, COWS, AIMS and Fall Risk screenings as ordered, Perform wound care treatments as ordered.  Evaluation of Outcomes: Progressing   LCSW Treatment Plan for Primary Diagnosis: <principal problem not specified> Long Term Goal(s): Safe transition to appropriate next level of care at discharge, Engage patient in therapeutic group addressing interpersonal concerns.  Short Term Goals: Engage patient in aftercare planning with referrals and resources and Increase skills for wellness and recovery  Therapeutic Interventions: Assess for all  discharge needs, 1 to 1 time with Social worker, Explore available resources and support systems, Assess for adequacy in community support network, Educate family and significant other(s) on suicide prevention, Complete Psychosocial Assessment, Interpersonal group therapy.  Evaluation of Outcomes: Progressing   Progress in Treatment: Attending groups: No. Participating in groups: No. Taking medication as prescribed: Yes. Toleration medication: Yes. Family/Significant other contact made: Yes, individual(s) contacted:  pt's sister Patient understands diagnosis: Yes. Discussing patient identified problems/goals with staff: Yes. Medical problems stabilized or resolved: Yes. Denies suicidal/homicidal ideation: Yes. Issues/concerns per patient self-inventory: No. Other:   New problem(s) identified: No, Describe:  None  New Short Term/Long Term Goal(s):  Patient Goals:  "I want to get some rest"  Discharge Plan or Barriers:   Reason for Continuation of Hospitalization: Anxiety Medication stabilization  Estimated Length of Stay: 1-2 days  Attendees: Patient: 04/22/2019 1:38 PM  Physician: Dr. Johnn Hai, MD 04/22/2019 1:38 PM  Nursing: Lurline Hare 04/22/2019 1:38 PM  RN Care Manager: 04/22/2019 1:38 PM  Social Worker: Ardelle Anton, LCSW 04/22/2019 1:38 PM  Recreational Therapist:  04/22/2019 1:38 PM  Other:  04/22/2019 1:38 PM  Other:  04/22/2019 1:38 PM  Other: 04/22/2019 1:38 PM       Scribe for Treatment Team: Trecia Rogers, LCSW 04/22/2019 1:47 PM

## 2019-04-22 NOTE — Progress Notes (Signed)
Recreation Therapy Notes  Date:  6.1.20 Time: 0930 Location: [address] Dayroom  Group Topic: Stress Management  Goal Area(s) Addresses:  Patient will identify positive stress management techniques. Patient will identify benefits of using stress management post d/c.  Intervention:  Stress Management  Activity :  Meditation.  LRT introduced the stress management technique of meditation.  LRT played a meditation that focused on make the most of your day.   Education:  Stress Management, Discharge Planning.   Education Outcome: Acknowledges Education  Clinical Observations/Feedback:  Pt did not attend group.    [name], LRT/CTRS         Ria Comment, Ailyn Gladd A 04/22/2019 11:08 AM

## 2019-04-22 NOTE — Plan of Care (Signed)
  Problem: Physical Regulation: Goal: Complications related to the disease process, condition or treatment will be avoided or minimized Outcome: Progressing   Problem: Safety: Goal: Ability to remain free from injury will improve Outcome: Progressing   Problem: Activity: Goal: Interest or engagement in leisure activities will improve Outcome: Progressing

## 2019-04-22 NOTE — Progress Notes (Signed)
Patient ID: Sara [name], female   DOB: February 16, 1976, 43 y.o.   MRN: [id number]  [place] NOVEL CORONAVIRUS (COVID-19) DAILY CHECK-OFF SYMPTOMS - answer yes or no to each - every day NO YES  Have you had a fever in the past 24 hours?  . Fever (Temp > 37.80C / 100F) X   Have you had any of these symptoms in the past 24 hours? . New Cough .  Sore Throat  .  Shortness of Breath .  Difficulty Breathing .  Unexplained Body Aches   X   Have you had any one of these symptoms in the past 24 hours not related to allergies?   . Runny Nose .  Nasal Congestion .  Sneezing   X   If you have had runny nose, nasal congestion, sneezing in the past 24 hours, has it worsened?  X   EXPOSURES - check yes or no X   Have you traveled outside the state in the past 14 days?  X   Have you been in contact with someone with a confirmed diagnosis of COVID-19 or PUI in the past 14 days without wearing appropriate PPE?  X   Have you been living in the same home as a person with confirmed diagnosis of COVID-19 or a PUI (household contact)?    X   Have you been diagnosed with COVID-19?    X              What to do next: Answered NO to all: Answered YES to anything:   Proceed with unit schedule Follow the [facility] Inpatient Flowsheet.

## 2019-04-23 MED ORDER — TOPIRAMATE 50 MG PO TABS: 50.0000 mg | ORAL_TABLET | Freq: Two times a day (BID) | ORAL | 0 refills | Status: DC

## 2019-04-23 MED ORDER — QUETIAPINE FUMARATE 100 MG PO TABS: 100.0000 mg | ORAL_TABLET | Freq: Every day | ORAL | 0 refills | Status: DC

## 2019-04-23 MED ORDER — ESCITALOPRAM OXALATE 20 MG PO TABS: 20.0000 mg | ORAL_TABLET | Freq: Every day | ORAL | 0 refills | Status: DC

## 2019-04-23 NOTE — [facility]HH Suicide Risk Assessment (Signed)
[facility] Discharge Suicide Risk Assessment   Principal Problem: Bipolar 1 disorder [facility]) Discharge Diagnoses: Principal Problem:   Bipolar 1 disorder ([place]) Active Problems:   Alcohol dependence ([place])   Total Time spent with patient: 45 minutes  Patient is seen alert and oriented and cooperative affect a little constricted but denies any thoughts of harming self or others and can contract fully no acute withdrawal still minimizing her alcoholism but again no need for further detox measures no acute thoughts of harming self  Mental Status Per Nursing Assessment::   On Admission:  Suicidal ideation indicated by patient  Demographic Factors:  Caucasian and Unemployed  Loss Factors: Decrease in vocational status  Historical Factors: NA  Risk Reduction Factors:   Positive social support  Continued Clinical Symptoms:  Alcohol/Substance Abuse/Dependencies  Cognitive Features That Contribute To Risk:  None    Suicide Risk:  Minimal: No identifiable suicidal ideation.  Patients presenting with no risk factors but with morbid ruminations; may be classified as minimal risk based on the severity of the depressive symptoms  Follow-up Information    [facility] Follow up on 05/01/2019.   Why:  Hospital follow up appointment is Wednesday, 6/10 at 12:30p.  Appointment will be over the phone and the provider will contact you.  Contact information: [address] [place] [address] [phone]           Plan Of Care/Follow-up recommendations:  Activity:  full  Kemoni Quesenberry, MD 04/23/2019, 10:15 AM

## 2019-04-23 NOTE — Progress Notes (Signed)
Pt attended spiritual care group on grief and loss facilitated by chaplain Jerene Pitch   Group opened with brief discussion and psycho-social ed around grief and loss in relationships and in relation to self - identifying life patterns, circumstances, changes that cause losses. Established group norm of speaking from own life experience. Group goal of establishing open and affirming space for members to share loss and experience with grief, normalize grief experience and provide psycho social education and grief support.    Sara [name] was present throughout group.  Engaged in group discussion voluntarily.  She discussed with another group member the theme of finding balance in caring for herself and others in her family.   Noted the death of her father and sister.  States when she cares for her mother around these losses, she recognizes she is drained and it takes days to recover.  Identified with group around themes that draw her to care for mother in particular ways, strategized ways to honor her care for mother while balancing care for self.    Jerene Pitch, MDiv, [facility]

## 2019-04-23 NOTE — Discharge Summary (Signed)
Physician Discharge Summary Note  Patient:  Sara [name] is an 43 y.o., female MRN:  [id number] DOB:  23-Jun-1976 Patient phone:  [phone] (home)  Patient address:   [place] [address],  Total Time spent with patient: 20 minutes  Date of Admission:  04/19/2019 Date of Discharge: 04/23/19   Reason for Admission:  Worsening depression with   Principal Problem: Bipolar 1 disorder [facility]) Discharge Diagnoses: Principal Problem:   Bipolar 1 disorder ([place]) Active Problems:   Alcohol dependence ([place])   Past Psychiatric History: history of prior admissions " mostly for detox ". States last admission was one and a half years ago. She reports history of mood disorder, and reports history of depression, which she attributes to grieving loss of loved ones and to alcohol . She also reports brief episodes of increased energy/ spending of short duration. Denies history of suicide attempts, no history of self cutting or self injurious behaviors . Reports history of panic attacks, denies agoraphobia   Past Medical History:  Past Medical History:  Diagnosis Date  . Alcohol abuse   . Alcoholism ([place])   . Anemia   . Anxiety   . Blood transfusion without reported diagnosis   . Cirrhosis ([place])   . Depression   . 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 GERD (gastroesophageal reflux disease)   . Heart murmur    Patient states she may have  . Menorrhagia   . Pancytopenia ([place]) 01/15/2014  . Pneumonia   . Portal hypertension ([place])   . S/P alcohol detoxification    2-3 days at behavioral health previously  . UGI bleed 06/12/2014    Past Surgical History:  Procedure Laterality Date  . CHOLECYSTECTOMY    . ESOPHAGOGASTRODUODENOSCOPY N/A 06/12/2014   Procedure: ESOPHAGOGASTRODUODENOSCOPY (EGD);  Surgeon: Gatha Mayer, MD;  Location: [facility];  Service: Endoscopy;  Laterality: N/A;  . ESOPHAGOGASTRODUODENOSCOPY (EGD) WITH PROPOFOL N/A 07/29/2014   Procedure:  ESOPHAGOGASTRODUODENOSCOPY (EGD) WITH PROPOFOL;  Surgeon: Inda Castle, MD;  Location: [facility];  Service: Endoscopy;  Laterality: N/A;  . ESOPHAGOGASTRODUODENOSCOPY (EGD) WITH PROPOFOL N/A 01/20/2018   Procedure: ESOPHAGOGASTRODUODENOSCOPY (EGD) WITH PROPOFOL;  Surgeon: Mauri Pole, MD;  Location: [facility];  Service: Endoscopy;  Laterality: N/A;   Family History:  Family History  Problem Relation Age of Onset  . Colon polyps Mother   . Hypertension Mother   . Thyroid disease Mother   . Alcoholism Mother   . Alcoholism Father   . Alcohol abuse Maternal Grandfather   . Alcohol abuse Paternal Grandfather   . Alcohol abuse Paternal Aunt   . Alcohol abuse Maternal Uncle    Family Psychiatric  History: reports history of alcohol abuse in members of extended family but not in parents, denies history of psychiatric illness or of suicides in family. See above Social History:  Social History   Substance and Sexual Activity  Alcohol Use Yes  . Alcohol/week: 0.0 standard drinks   Comment: Usually drinks 2-3 bottles of wine daily when drinking.      Social History   Substance and Sexual Activity  Drug Use No  . Types: Cocaine, Marijuana   Comment: denies    Social History   Socioeconomic History  . Marital status: Married    Spouse name: [name]  . Number of children: 2  . Years of education: Associates  . Highest education level: Not on file  Occupational History  . Occupation: Radio broadcast assistant  Social Needs  . Financial resource strain:  Not on file  . Food insecurity:    Worry: Not on file    Inability: Not on file  . Transportation needs:    Medical: Not on file    Non-medical: Not on file  Tobacco Use  . Smoking status: Current Every Day Smoker    Packs/day: 0.25    Types: Cigarettes  . Smokeless tobacco: Never Used  Substance and Sexual Activity  . Alcohol use: Yes    Alcohol/week: 0.0 standard drinks    Comment: Usually drinks 2-3 bottles of wine daily  when drinking.   . Drug use: No    Types: Cocaine, Marijuana    Comment: denies  . Sexual activity: Never    Birth control/protection: None  Lifestyle  . Physical activity:    Days per week: Not on file    Minutes per session: Not on file  . Stress: Not on file  Relationships  . Social connections:    Talks on phone: Not on file    Gets together: Not on file    Attends religious service: Not on file    Active member of club or organization: Not on file    Attends meetings of clubs or organizations: Not on file    Relationship status: Not on file  Other Topics Concern  . Not on file  Social History Narrative   Lives with husband and son. Daughter lives nearby. 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    Hospital Course:   04/20/19 [facility] MD Assessment: 43 year old married female, brought in by EMS to ED yesterday. Husband called 61, concerned that patient had overdosed on hydroxyzine. Patient denies overdosing , states " I only took three, I was just trying to relax". She does endorse depression, and states " my depression comes and goes ". States she has been depressed related to loss of loved ones , namely  her father, who died from [place] in 03-02-16, and her sister , who passed away in a car accident last year. She also acknowledges " the alcohol does not help, it makes my depression worse". The denies any recent suicidal ideations and does not endorse major neuro-vegetative symptoms at this time, although does endorse some anhedonia. She reports history of alcohol dependence , and states she relapsed 2-3 days ago after a period of several weeks sobriety. Reports she was drinking 6 beers daily.  Admission BAL 345, admission UDS positive for BZDs, and patient states she had recently been taking Librium ( irregularly) which she states had been prescribed in ED about a week prior .   Patient remained on the [facility] unit for 3 days. The patient stabilized on medication and  therapy. Patient was discharged on Lexapro 20 mg Daily, Seroquel 100 mg QHS, and Topamax 50 mg BID. Patient completed Ativan detox protocol. Patient shows minimal insight into her alcohol abuse Patient has shown improvement with improved mood, affect, sleep, appetite, and interaction. Patient has attended group and participated. Patient has been seen in the day room interacting with peers and staff appropriately. Patient denies any SI/HI/AVH and contracts for safety. Patient agrees to follow up at [facility]. Patient is provided with prescriptions for their medications upon discharge.   Physical Findings: AIMS: Facial and Oral Movements Muscles of Facial Expression: None, normal Lips and Perioral Area: None, normal Jaw: None, normal Tongue: None, normal,Extremity Movements Upper (arms, wrists, hands, fingers): None, normal Lower (legs, knees, ankles, toes): None, normal, Trunk Movements Neck, shoulders, hips: None, normal,  Overall Severity Severity of abnormal movements (highest score from questions above): None, normal Incapacitation due to abnormal movements: None, normal Patient's awareness of abnormal movements (rate only patient's report): No Awareness, Dental Status Current problems with teeth and/or dentures?: No Does patient usually wear dentures?: No  CIWA:  CIWA-Ar Total: 0 COWS:     Musculoskeletal: Strength & Muscle Tone: within normal limits Gait & Station: normal Patient leans: N/A  Psychiatric Specialty Exam: Physical Exam  Nursing note and vitals reviewed. Constitutional: She is oriented to person, place, and time. She appears well-developed and well-nourished.  Cardiovascular: Normal rate.  Respiratory: Effort normal.  Musculoskeletal: Normal range of motion.  Neurological: She is alert and oriented to person, place, and time.  Skin: Skin is warm.    Review of Systems  Constitutional: Negative.   HENT: Negative.   Eyes: Negative.   Respiratory: Negative.    Cardiovascular: Negative.   Gastrointestinal: Negative.   Genitourinary: Negative.   Musculoskeletal: Negative.   Skin: Negative.   Neurological: Negative.   Endo/Heme/Allergies: Negative.   Psychiatric/Behavioral: Negative.     Blood pressure 101/72, pulse 85, temperature 98.3 F (36.8 C), temperature source Oral, resp. rate 16, height 5' 3.5" (1.613 m), weight 70.3 kg, last menstrual period 10/19/2018, SpO2 95 %.Body mass index is 27.03 kg/m.  General Appearance: Casual  Eye Contact:  Good  Speech:  Clear and Coherent and Normal Rate  Volume:  Normal  Mood:  Euthymic  Affect:  Congruent  Thought Process:  Coherent and Descriptions of Associations: Intact  Orientation:  Full (Time, Place, and Person)  Thought Content:  WDL  Suicidal Thoughts:  No  Homicidal Thoughts:  No  Memory:  Immediate;   Good Recent;   Good Remote;   Good  Judgement:  Fair  Insight:  Lacking  Psychomotor Activity:  Normal  Concentration:  Concentration: Good and Attention Span: Good  Recall:  Good  Fund of Knowledge:  Good  Language:  Good  Akathisia:  No  Handed:  Right  AIMS (if indicated):     Assets:  Communication Skills Desire for Improvement Financial Resources/Insurance Housing Physical Health Resilience Social Support Transportation  ADL's:  Intact  Cognition:  WNL  Sleep:  Number of Hours: 5.5     Have you used any form of tobacco in the last 30 days? (Cigarettes, Smokeless Tobacco, Cigars, and/or Pipes): No  Has this patient used any form of tobacco in the last 30 days? (Cigarettes, Smokeless Tobacco, Cigars, and/or Pipes) Yes, No  Blood Alcohol level:  Lab Results  Component Value Date   ETH 345 (HH) 04/19/2019   ETH 337 (HH) 23/55/7322    Metabolic Disorder Labs:  Lab Results  Component Value Date   HGBA1C 4.6 (L) 04/21/2019   MPG 85.32 04/21/2019   MPG 82 05/04/2017   Lab Results  Component Value Date   PROLACTIN 28.7 (H) 05/04/2017   Lab Results   Component Value Date   CHOL 129 04/21/2019   TRIG 46 04/21/2019   HDL 44 04/21/2019   CHOLHDL 2.9 04/21/2019   VLDL 9 04/21/2019   LDLCALC 76 04/21/2019   LDLCALC 110 (H) 05/04/2017    See Psychiatric Specialty Exam and Suicide Risk Assessment completed by Attending Physician prior to discharge.  Discharge destination:  Home  Is patient on multiple antipsychotic therapies at discharge:  No   Has Patient had three or more failed trials of antipsychotic monotherapy by history:  No  Recommended Plan for Multiple Antipsychotic Therapies: NA  Allergies as of 04/23/2019      Reactions   Morphine And Related Other (See Comments)   Slowed HR, lowered BP   Nsaids Other (See Comments)   Caused internal bleeding      Medication List    STOP taking these medications   chlordiazePOXIDE 5 MG capsule Commonly known as:  LIBRIUM   hydrOXYzine 25 MG tablet Commonly known as:  ATARAX/VISTARIL   lactulose 10 GM/15ML solution Commonly known as:  CHRONULAC   lidocaine 2 % solution Commonly known as:  XYLOCAINE   polyvinyl alcohol 1.4 % ophthalmic solution Commonly known as:  LIQUIFILM TEARS   thiamine 100 MG tablet Commonly known as:  VITAMIN B-1   VITAMIN D PO     TAKE these medications     Indication  escitalopram 20 MG tablet Commonly known as:  LEXAPRO Take 1 tablet (20 mg total) by mouth daily. Start taking on:  April 24, 2019 What changed:    medication strength  how much to take  Indication:  Major Depressive Disorder   QUEtiapine 100 MG tablet Commonly known as:  SEROQUEL Take 1 tablet (100 mg total) by mouth at bedtime. What changed:    medication strength  how much to take  Indication:  Major Depressive Disorder   topiramate 50 MG tablet Commonly known as:  TOPAMAX Take 1 tablet (50 mg total) by mouth 2 (two) times daily. What changed:    medication strength  how much to take  Indication:  Per PCP      Follow-up Information    [facility]  Follow up on 05/01/2019.   Why:  Hospital follow up appointment is Wednesday, 6/10 at 12:30p.  Appointment will be over the phone and the provider will contact you.  Contact information: [address] [place] [address] [phone]           Follow-up recommendations:  Continue activity as tolerated. Continue diet as recommended by your PCP. Ensure to keep all appointments with outpatient providers.  Comments:  Patient is instructed prior to discharge to: Take all medications as prescribed by his/her mental healthcare provider. Report any adverse effects and or reactions from the medicines to his/her outpatient provider promptly. Patient has been instructed & cautioned: To not engage in alcohol and or illegal drug use while on prescription medicines. In the event of worsening symptoms, patient is instructed to call the crisis hotline, 911 and or go to the nearest ED for appropriate evaluation and treatment of symptoms. To follow-up with his/her primary care provider for your other medical issues, concerns and or health care needs.    Signed: Lowry Ram Dustina Scoggin, FNP 04/23/2019, 9:56 AM

## 2019-04-23 NOTE — Progress Notes (Signed)
Pt discharged to lobby. Pt was stable and appreciative at that time. All papers, samples and prescriptions were given and valuables returned. Verbal understanding expressed. Denies SI/HI and A/VH. Pt given opportunity to express concerns and ask questions.  

## 2019-04-23 NOTE — [facility] Suicide Risk Assessment (Signed)
[place] INPATIENT:  Family/Significant Other Suicide Prevention Education  Suicide Prevention Education:  Contact Attempts:sister, Berkley Harvey [phone]) has been identified by the patient as the family member/significant other with whom the patient will be residing, and identified as the person(s) who will aid the patient in the event of a mental health crisis.  With written consent from the patient, two attempts were made to provide suicide prevention education, prior to and/or following the patient's discharge.  We were unsuccessful in providing suicide prevention education.  A suicide education pamphlet was given to the patient to share with family/significant other.  Date and time of first attempt: 04/23/2019 at 8:55am Date and time of second attempt: 04/23/2019 at 9:00am  The call went straight to voicemail each time, with no option to leave a message. CSW provided suicide education pamphlet to patient to review and share with supports.   Joellen Jersey 04/23/2019, 9:03 AM

## 2019-04-23 NOTE — Progress Notes (Signed)
  [facility] Adult Case Management Discharge Plan :  Will you be returning to the same living situation after discharge:  Yes,  home with husband At discharge, do you have transportation home?: Yes,  husband Do you have the ability to pay for your medications: No.; [facility]  Release of information consent forms completed and in the chart;  Patient's signature needed at discharge.  Patient to Follow up at: Follow-up Information    [facility] Follow up on 05/01/2019.   Why:  Hospital follow up appointment is Wednesday, 6/10 at 12:30p.  Appointment will be over the phone and the provider will contact you.  Contact information: [place] [address] [phone]           Next level of care provider has access to [place] and Suicide Prevention discussed: No. ; tried with sister (no answer). SPE with pt  Have you used any form of tobacco in the last 30 days? (Cigarettes, Smokeless Tobacco, Cigars, and/or Pipes): No  Has patient been referred to the Quitline?: N/A patient is not a smoker  Patient has been referred for addiction treatment: Yes  Trecia Rogers, LCSW 04/23/2019, 9:44 AM

## 2019-05-29 ENCOUNTER — Encounter (HOSPITAL_COMMUNITY): Payer: Self-pay

## 2019-05-29 ENCOUNTER — Other Ambulatory Visit: Payer: Self-pay

## 2019-05-29 ENCOUNTER — Emergency Department (HOSPITAL_COMMUNITY)
Admission: EM | Admit: 2019-05-29 | Discharge: 2019-05-29 | Disposition: A | Discharge status: Home / Self Care | Payer: Self-pay | Attending: Emergency Medicine | Admitting: Emergency Medicine

## 2019-05-29 DIAGNOSIS — F17210 Nicotine dependence, cigarettes, uncomplicated: Secondary | ICD-10-CM | POA: Insufficient documentation

## 2019-05-29 DIAGNOSIS — I959 Hypotension, unspecified: Secondary | ICD-10-CM | POA: Insufficient documentation

## 2019-05-29 DIAGNOSIS — F10920 Alcohol use, unspecified with intoxication, uncomplicated: Secondary | ICD-10-CM | POA: Insufficient documentation

## 2019-05-29 DIAGNOSIS — F1010 Alcohol abuse, uncomplicated: Secondary | ICD-10-CM | POA: Insufficient documentation

## 2019-05-29 DIAGNOSIS — Z79899 Other long term (current) drug therapy: Secondary | ICD-10-CM | POA: Insufficient documentation

## 2019-05-29 MED ORDER — HYDROXYZINE HCL 25 MG PO TABS
25.0000 mg | ORAL_TABLET | Freq: Once | ORAL | Status: AC
  Administered 2019-05-29: 25 mg via ORAL
  Filled 2019-05-29: qty 1

## 2019-05-29 MED ORDER — SODIUM CHLORIDE 0.9 % IV SOLN
500.0000 mL | Freq: Once | INTRAVENOUS | Status: AC
  Administered 2019-05-29: 500 mL via INTRAVENOUS
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
## 2019-05-29 NOTE — Discharge Instructions (Addendum)
Avoid all forms of alcohol.  Make sure you are drinking plenty of fluids.  Follow-up with your doctor of your choice for checkup.  Use information on the attached resource guide to help you find a treatment center for alcohol abuse.

## 2019-05-29 NOTE — ED Notes (Addendum)
Pt given a coke and ham sandwhich

## 2019-06-13 ENCOUNTER — Other Ambulatory Visit: Payer: Self-pay

## 2019-06-13 ENCOUNTER — Emergency Department (HOSPITAL_COMMUNITY)
Admission: EM | Admit: 2019-06-13 | Discharge: 2019-06-14 | Disposition: A | Discharge status: Home / Self Care | Payer: Self-pay | Attending: Emergency Medicine | Admitting: Emergency Medicine

## 2019-06-13 ENCOUNTER — Encounter (HOSPITAL_COMMUNITY): Payer: Self-pay | Admitting: Emergency Medicine

## 2019-06-13 DIAGNOSIS — Z79899 Other long term (current) drug therapy: Secondary | ICD-10-CM | POA: Insufficient documentation

## 2019-06-13 DIAGNOSIS — F1310 Sedative, hypnotic or anxiolytic abuse, uncomplicated: Secondary | ICD-10-CM | POA: Insufficient documentation

## 2019-06-13 DIAGNOSIS — R45851 Suicidal ideations: Secondary | ICD-10-CM | POA: Insufficient documentation

## 2019-06-13 DIAGNOSIS — F332 Major depressive disorder, recurrent severe without psychotic features: Secondary | ICD-10-CM | POA: Insufficient documentation

## 2019-06-13 DIAGNOSIS — F17210 Nicotine dependence, cigarettes, uncomplicated: Secondary | ICD-10-CM | POA: Insufficient documentation

## 2019-06-13 DIAGNOSIS — F1020 Alcohol dependence, uncomplicated: Secondary | ICD-10-CM | POA: Diagnosis present

## 2019-06-13 DIAGNOSIS — Y908 Blood alcohol level of 240 mg/100 ml or more: Secondary | ICD-10-CM | POA: Insufficient documentation

## 2019-06-13 DIAGNOSIS — R110 Nausea: Secondary | ICD-10-CM | POA: Insufficient documentation

## 2019-06-13 DIAGNOSIS — F1010 Alcohol abuse, uncomplicated: Secondary | ICD-10-CM | POA: Insufficient documentation

## 2019-06-13 DIAGNOSIS — R1010 Upper abdominal pain, unspecified: Secondary | ICD-10-CM | POA: Insufficient documentation

## 2019-06-13 LAB — RAPID URINE DRUG SCREEN, HOSP PERFORMED
Amphetamines: NOT DETECTED
Barbiturates: NOT DETECTED
Benzodiazepines: NOT DETECTED
Cocaine: NOT DETECTED
Opiates: NOT DETECTED
Tetrahydrocannabinol: NOT DETECTED

## 2019-06-13 LAB — CBC WITH DIFFERENTIAL/PLATELET
Abs Immature Granulocytes: 0.01 10*3/uL (ref 0.00–0.07)
Basophils Absolute: 0 10*3/uL (ref 0.0–0.1)
Basophils Relative: 1 %
Eosinophils Absolute: 0 10*3/uL (ref 0.0–0.5)
Eosinophils Relative: 1 %
HCT: 33.9 % — ABNORMAL LOW (ref 36.0–46.0)
Hemoglobin: 10.5 g/dL — ABNORMAL LOW (ref 12.0–15.0)
Immature Granulocytes: 0 %
Lymphocytes Relative: 53 %
Lymphs Abs: 2.2 10*3/uL (ref 0.7–4.0)
MCH: 27.3 pg (ref 26.0–34.0)
MCHC: 31 g/dL (ref 30.0–36.0)
MCV: 88.1 fL (ref 80.0–100.0)
Monocytes Absolute: 0.2 10*3/uL (ref 0.1–1.0)
Monocytes Relative: 5 %
Neutro Abs: 1.7 10*3/uL (ref 1.7–7.7)
Neutrophils Relative %: 40 %
Platelets: 67 10*3/uL — ABNORMAL LOW (ref 150–400)
RBC: 3.85 MIL/uL — ABNORMAL LOW (ref 3.87–5.11)
RDW: 19.9 % — ABNORMAL HIGH (ref 11.5–15.5)
WBC: 4.2 10*3/uL (ref 4.0–10.5)
nRBC: 0 % (ref 0.0–0.2)

## 2019-06-13 LAB — COMPREHENSIVE METABOLIC PANEL
ALT: 29 U/L (ref 0–44)
AST: 76 U/L — ABNORMAL HIGH (ref 15–41)
Albumin: 3.7 g/dL (ref 3.5–5.0)
Alkaline Phosphatase: 99 U/L (ref 38–126)
Anion gap: 11 (ref 5–15)
BUN: 7 mg/dL (ref 6–20)
CO2: 21 mmol/L — ABNORMAL LOW (ref 22–32)
Calcium: 8.2 mg/dL — ABNORMAL LOW (ref 8.9–10.3)
Chloride: 112 mmol/L — ABNORMAL HIGH (ref 98–111)
Creatinine, Ser: 0.44 mg/dL (ref 0.44–1.00)
GFR calc Af Amer: >60 mL/min (ref >60)
GFR calc non Af Amer: >60 mL/min (ref >60)
Glucose, Bld: 91 mg/dL (ref 70–99)
Potassium: 3.4 mmol/L — ABNORMAL LOW (ref 3.5–5.1)
Sodium: 144 mmol/L (ref 135–145)
Total Bilirubin: 0.8 mg/dL (ref 0.3–1.2)
Total Protein: 7.7 g/dL (ref 6.5–8.1)

## 2019-06-13 LAB — ACETAMINOPHEN LEVEL: Acetaminophen (Tylenol), Serum: <10 ug/mL — ABNORMAL LOW (ref 10–30)

## 2019-06-13 LAB — SALICYLATE LEVEL: Salicylate Lvl: <7 mg/dL (ref 2.8–30.0)

## 2019-06-13 LAB — ETHANOL: Alcohol, Ethyl (B): 462 mg/dL (ref <10)

## 2019-06-13 MED ORDER — ONDANSETRON 4 MG PO TBDP
4.0000 mg | ORAL_TABLET | Freq: Once | ORAL | Status: AC
  Administered 2019-06-14: 4 mg via ORAL
  Filled 2019-06-13 (×3): qty 1

## 2019-06-13 MED ORDER — LORAZEPAM 1 MG PO TABS
1.0000 mg | ORAL_TABLET | Freq: Once | ORAL | Status: AC
  Administered 2019-06-14: 04:00:00 1 mg via ORAL
  Filled 2019-06-13 (×3): qty 1
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
## 2019-06-13 NOTE — ED Notes (Signed)
Pt walking down the hallway with her gown open. This nurse called out for patient but she continued to walk towards another department. Pt stopped at doors and began crying telling this nurse and NT Caryl Pina that she was scared. We reassured her that she is in a safe place and we can return her to her room, pt agreed.
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
## 2019-06-13 NOTE — ED Notes (Signed)
Pt took off her pulse ox and blood pressure cuff and found walking around the room. Pt stating she needs to use the restroom, ambulated with no difficulty.

## 2019-06-13 NOTE — ED Notes (Signed)
Pt got up and was standing in her room. When asked if she needed anything, she states, "um, I need to fucking go to the bathroom." Pt ambulated to the restroom with a steady gait and encouraged to use the call bell for safety.

## 2019-06-13 NOTE — Progress Notes (Signed)
BAL 462. TTS unable to assess due to pt's BAL.   Lind Covert, MSW, LCSW Therapeutic Triage Specialist  [phone]

## 2019-06-13 NOTE — ED Notes (Addendum)
Date and time results received: 06/13/19 2336 (use smartphrase ".now" to insert current time)  Test: ETOH Critical Value: 462  Name of Provider Notified: Ward PA  Orders Received? Or Actions Taken?: waiting on orders

## 2019-06-14 LAB — PREGNANCY, URINE: Preg Test, Ur: NEGATIVE

## 2019-06-14 MED ORDER — CHLORDIAZEPOXIDE HCL 25 MG PO CAPS: 25.0000 mg | ORAL_CAPSULE | Freq: Once | ORAL | Status: DC

## 2019-06-14 MED ORDER — ALUM & MAG HYDROXIDE-SIMETH 200-200-20 MG/5ML PO SUSP
30.0000 mL | Freq: Once | ORAL | Status: AC
  Administered 2019-06-14: 30 mL via ORAL
  Filled 2019-06-14: qty 30

## 2019-06-14 MED ORDER — ONDANSETRON 8 MG PO TBDP
8.0000 mg | ORAL_TABLET | Freq: Three times a day (TID) | ORAL | Status: DC | PRN
  Administered 2019-06-14: 8 mg via ORAL
  Filled 2019-06-14: qty 1

## 2019-06-14 MED ORDER — HYDROXYZINE HCL 25 MG PO TABS
25.0000 mg | ORAL_TABLET | Freq: Four times a day (QID) | ORAL | Status: DC | PRN
  Administered 2019-06-14: 25 mg via ORAL
  Filled 2019-06-14: qty 1

## 2019-06-14 NOTE — ED Notes (Signed)
Pt stated nausa

## 2019-06-14 NOTE — BH Assessment (Signed)
[facility] Assessment Progress Note    Case was staffed with Silvio Pate FNP who recommended a inpatient admission to assist with stabilization.
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
## 2019-06-14 NOTE — ED Provider Notes (Signed)
.  Patient seen/examined in the Emergency Department in conjunction with Advanced Practice Provider Ward Patient reports heavy ETOH use and SI Exam : awake/alert, appears intoxicated Plan: psych consult     Ripley Fraise, MD 06/14/19 [phone]
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
## 2019-06-14 NOTE — BH Assessment (Signed)
[facility] Assessment Progress Note  Per Buford Dresser, DO, this pt does not require psychiatric hospitalization at this time.  Pt presents under IVC initiated by EDP Ripley Fraise, MD, which Dr Mariea Clonts will be rescinding.  Pt is to be discharged from [facility] with recommendation to follow up with [facility].  This has been included in pt's discharge instructions.  Pt would also benefit from seeing Peer Support Specialists; they will be asked to speak to pt.  Pt's nurse, Eustaquio Maize, has been notified.  Jalene Mullet, [place] Triage Specialist [phone]

## 2019-06-14 NOTE — ED Notes (Addendum)
Pt DCd off unit to home per MD. Pt alert, calm, cooperative, no s/s of distress. Pt DC information given and reviewed with pt, with acknowledged understanding. Belongings given to pt.  Pt ambulatory, escorted off unit by RN. Pt going to use bus, per pt.

## 2019-06-14 NOTE — Discharge Instructions (Signed)
For your behavioral health needs you are advised to follow up with [facility].  New patients are seen at their walk-in clinic.  Walk-in hours are Monday - Friday from 8:30 am - 12:00 pm, and from 1:00 pm - 2:30 pm.  Walk-in patients are seen on a first come, first served basis, so try to arrive as early as possible for the best chance of being seen the same day:       [facility]      [address]      [place], [place] [address]      [phone] 

## 2019-06-14 NOTE — ED Notes (Signed)
Pt dressed out in burgundy scrubs and yellow socks, wanded by security, pt belongings placed in locker
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
## 2019-06-22 ENCOUNTER — Other Ambulatory Visit: Payer: Self-pay

## 2019-06-22 ENCOUNTER — Emergency Department (HOSPITAL_COMMUNITY): Payer: Self-pay

## 2019-06-22 ENCOUNTER — Emergency Department (HOSPITAL_COMMUNITY)
Admission: EM | Admit: 2019-06-22 | Discharge: 2019-06-22 | Disposition: A | Discharge status: Home / Self Care | Payer: Self-pay | Attending: Emergency Medicine | Admitting: Emergency Medicine

## 2019-06-22 DIAGNOSIS — Z79899 Other long term (current) drug therapy: Secondary | ICD-10-CM | POA: Insufficient documentation

## 2019-06-22 DIAGNOSIS — F10221 Alcohol dependence with intoxication delirium: Secondary | ICD-10-CM

## 2019-06-22 DIAGNOSIS — R55 Syncope and collapse: Secondary | ICD-10-CM | POA: Insufficient documentation

## 2019-06-22 DIAGNOSIS — F10921 Alcohol use, unspecified with intoxication delirium: Secondary | ICD-10-CM | POA: Insufficient documentation

## 2019-06-22 LAB — RAPID URINE DRUG SCREEN, HOSP PERFORMED
Amphetamines: NOT DETECTED
Barbiturates: NOT DETECTED
Benzodiazepines: NOT DETECTED
Cocaine: NOT DETECTED
Opiates: NOT DETECTED
Tetrahydrocannabinol: NOT DETECTED

## 2019-06-22 LAB — COMPREHENSIVE METABOLIC PANEL
ALT: 29 U/L (ref 0–44)
AST: 72 U/L — ABNORMAL HIGH (ref 15–41)
Albumin: 3.7 g/dL (ref 3.5–5.0)
Alkaline Phosphatase: 84 U/L (ref 38–126)
Anion gap: 13 (ref 5–15)
BUN: <5 mg/dL — ABNORMAL LOW (ref 6–20)
CO2: 19 mmol/L — ABNORMAL LOW (ref 22–32)
Calcium: 8.3 mg/dL — ABNORMAL LOW (ref 8.9–10.3)
Chloride: 108 mmol/L (ref 98–111)
Creatinine, Ser: 0.5 mg/dL (ref 0.44–1.00)
GFR calc Af Amer: >60 mL/min (ref >60)
GFR calc non Af Amer: >60 mL/min (ref >60)
Glucose, Bld: 141 mg/dL — ABNORMAL HIGH (ref 70–99)
Potassium: 3 mmol/L — ABNORMAL LOW (ref 3.5–5.1)
Sodium: 140 mmol/L (ref 135–145)
Total Bilirubin: 1.1 mg/dL (ref 0.3–1.2)
Total Protein: 7.8 g/dL (ref 6.5–8.1)

## 2019-06-22 LAB — I-STAT BETA HCG BLOOD, ED (MC, WL, AP ONLY): I-stat hCG, quantitative: <5 m[IU]/mL (ref <5)

## 2019-06-22 LAB — URINALYSIS, ROUTINE W REFLEX MICROSCOPIC
Bilirubin Urine: NEGATIVE
Glucose, UA: NEGATIVE mg/dL
Ketones, ur: NEGATIVE mg/dL
Nitrite: NEGATIVE
Protein, ur: NEGATIVE mg/dL
Specific Gravity, Urine: 1.002 — ABNORMAL LOW (ref 1.005–1.030)
pH: 6 (ref 5.0–8.0)

## 2019-06-22 LAB — WET PREP, GENITAL
Sperm: NONE SEEN
Trich, Wet Prep: NONE SEEN
Yeast Wet Prep HPF POC: NONE SEEN

## 2019-06-22 LAB — CBC
HCT: 31 % — ABNORMAL LOW (ref 36.0–46.0)
Hemoglobin: 9.8 g/dL — ABNORMAL LOW (ref 12.0–15.0)
MCH: 27.7 pg (ref 26.0–34.0)
MCHC: 31.6 g/dL (ref 30.0–36.0)
MCV: 87.6 fL (ref 80.0–100.0)
Platelets: 72 10*3/uL — ABNORMAL LOW (ref 150–400)
RBC: 3.54 MIL/uL — ABNORMAL LOW (ref 3.87–5.11)
RDW: 20 % — ABNORMAL HIGH (ref 11.5–15.5)
WBC: 4.2 10*3/uL (ref 4.0–10.5)
nRBC: 0 % (ref 0.0–0.2)

## 2019-06-22 LAB — ETHANOL: Alcohol, Ethyl (B): 445 mg/dL (ref <10)

## 2019-06-22 MED ORDER — CEFTRIAXONE SODIUM 250 MG IJ SOLR
250.0000 mg | Freq: Once | INTRAMUSCULAR | Status: AC
  Administered 2019-06-22: 19:00:00 250 mg via INTRAMUSCULAR
  Filled 2019-06-22: qty 250

## 2019-06-22 MED ORDER — AZITHROMYCIN 250 MG PO TABS
1000.0000 mg | ORAL_TABLET | Freq: Once | ORAL | Status: AC
  Administered 2019-06-22: 19:00:00 1000 mg via ORAL
  Filled 2019-06-22: qty 4

## 2019-06-22 MED ORDER — LIDOCAINE HCL (PF) 1 % IJ SOLN
INTRAMUSCULAR | Status: AC
  Administered 2019-06-22: 19:00:00 2 mL
  Filled 2019-06-22: qty 5

## 2019-06-22 MED ORDER — ULIPRISTAL ACETATE 30 MG PO TABS
30.0000 mg | ORAL_TABLET | Freq: Once | ORAL | Status: AC
  Administered 2019-06-22: 30 mg via ORAL
  Filled 2019-06-22: qty 1

## 2019-06-22 NOTE — ED Notes (Signed)
Spoke with spouse. He is coming to pick up pt
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
## 2019-06-22 NOTE — ED Notes (Signed)
Patient Alert and oriented to baseline. Stable and ambulatory to baseline. Patient verbalized understanding of the discharge instructions.  Patient belongings were taken by the patient.   
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
## 2019-06-23 LAB — HEPATITIS PANEL, ACUTE
HCV Ab: 0.1 s/co ratio (ref 0.0–0.9)
Hep A IgM: NEGATIVE
Hep B C IgM: NEGATIVE
Hepatitis B Surface Ag: NEGATIVE

## 2019-06-23 LAB — HIV ANTIBODY (ROUTINE TESTING W REFLEX): HIV Screen 4th Generation wRfx: NONREACTIVE

## 2019-06-23 LAB — RPR: RPR Ser Ql: NONREACTIVE

## 2019-06-25 LAB — GC/CHLAMYDIA PROBE AMP (~~LOC~~) NOT AT ARMC
Chlamydia: NEGATIVE
Neisseria Gonorrhea: NEGATIVE

## 2019-07-24 ENCOUNTER — Telehealth (INDEPENDENT_AMBULATORY_CARE_PROVIDER_SITE_OTHER): Payer: Self-pay | Admitting: Primary Care

## 2019-07-24 ENCOUNTER — Encounter (INDEPENDENT_AMBULATORY_CARE_PROVIDER_SITE_OTHER): Payer: Self-pay | Admitting: Primary Care

## 2019-07-24 DIAGNOSIS — F339 Major depressive disorder, recurrent, unspecified: Secondary | ICD-10-CM

## 2019-07-24 DIAGNOSIS — Z7689 Persons encountering health services in other specified circumstances: Secondary | ICD-10-CM

## 2019-07-24 DIAGNOSIS — K7030 Alcoholic cirrhosis of liver without ascites: Secondary | ICD-10-CM

## 2019-07-24 DIAGNOSIS — Z09 Encounter for follow-up examination after completed treatment for conditions other than malignant neoplasm: Secondary | ICD-10-CM

## 2019-07-24 DIAGNOSIS — F1024 Alcohol dependence with alcohol-induced mood disorder: Secondary | ICD-10-CM

## 2019-07-24 NOTE — Progress Notes (Signed)
Virtual Visit via Telephone Note  I connected with Sara [name] on 07/24/19 at  1:50 PM EDT by telephone and verified that I am speaking with the correct person using two identifiers.   I discussed the limitations, risks, security and privacy concerns of performing an evaluation and management service by telephone and the availability of in person appointments. I also discussed with the patient that there may be a patient responsible charge related to this service. The patient expressed understanding and agreed to proceed.  WEB History of Present Illness: Ms. Sara [name] is presently admitted to [facility] facility for alcoholism. She voice only one concern that she was having dry eyes and would like to use over the counter visine.  Past Medical History  Depression, Bipolar disorder and alcohol use disorder severe presents,  History of detox and she was sober for 7 months but relapsed in 04-28-23 after her sister's death. Alcohol abuse is a factor for her depression.  Observations/Objective: Review of Systems  Eyes:       Dry  All other systems reviewed and are negative.   Assessment and Plan: Sara [name] was seen today for hospitalization follow-up.  Diagnoses and all orders for this visit:  Major depressive disorder, recurrent episode with anxious distress ([place]) Depression is when you feel down, blue or sad for at least 2 weeks in a row. You may experience increaset increase in sleeping, eating or  loss of interest in doing things that once gave you pleasure. Feeling worthless, guilty, nervous and low self esteem, avoiding interaction with other people or increase agitation.  Alcohol dependence with alcohol-induced mood disorder (HCC)  Alcohol is empty calories that are with elevated sugar can cause elevated blood sugars and decrease desires to eat proper dietary meal plain. Increase proteins and eliminate alcohol. Alcohol can not stop abruptly witalcoholic anynomous or [facility] to manage  alcohol withdrawals hout having withdrawals recommended to contact a   Alcoholic cirrhosis of liver without ascites ([place]) Cirrhosis of the liver is a chronic hepatocellular disease with inflammation, necrosis, and fibrosis potentially leading to liver failure and or cancer.  21% etiology and pathology is due to alcoholism.  Symptoms may include fatigue, malaise, weakness, anorexia leading to weight loss, right upper abdominal pain, bruising bleeding and hematemesis.  Presently in [facility] for treatment  Encounter to establish care Sara [name] is establishing care no PCP on record. Abnormal labs in hospital and she will need a lab visit to re-evaluate treatment  Hospital discharge follow-up Sara [name] is a hospital follow up for depression, Bipolar disorder and alcohol use disorder severe.   Follow Up Instructions:    I discussed the assessment and treatment plan with the patient. The patient was provided an opportunity to ask questions and all were answered. The patient agreed with the plan and demonstrated an understanding of the instructions.   The patient was advised to call back or seek an in-person evaluation if the symptoms worsen or if the condition fails to improve as anticipated.  I provided 34 minutes of -face-to-face time during this encounter. Includes review of chart, labs and images.    Kerin Perna, NP

## 2019-07-24 NOTE — Progress Notes (Signed)
Vitals taken today by RN at 1:40   97.9  Temp 16  Respirations  65 HR 97/60 BP  96 O2

## 2019-07-26 ENCOUNTER — Other Ambulatory Visit (INDEPENDENT_AMBULATORY_CARE_PROVIDER_SITE_OTHER): Payer: Self-pay

## 2019-07-26 ENCOUNTER — Other Ambulatory Visit (INDEPENDENT_AMBULATORY_CARE_PROVIDER_SITE_OTHER): Payer: Self-pay | Admitting: Primary Care

## 2019-07-26 ENCOUNTER — Other Ambulatory Visit: Payer: Self-pay

## 2019-07-26 DIAGNOSIS — K7030 Alcoholic cirrhosis of liver without ascites: Secondary | ICD-10-CM

## 2019-07-27 LAB — COMPREHENSIVE METABOLIC PANEL
ALT: 23 IU/L (ref 0–32)
AST: 31 IU/L (ref 0–40)
Albumin/Globulin Ratio: 1 — ABNORMAL LOW (ref 1.2–2.2)
Albumin: 3.9 g/dL (ref 3.8–4.8)
Alkaline Phosphatase: 92 IU/L (ref 39–117)
BUN/Creatinine Ratio: 9 (ref 9–23)
BUN: 6 mg/dL (ref 6–24)
Bilirubin Total: 0.7 mg/dL (ref 0.0–1.2)
CO2: 18 mmol/L — ABNORMAL LOW (ref 20–29)
Calcium: 8.9 mg/dL (ref 8.7–10.2)
Chloride: 110 mmol/L — ABNORMAL HIGH (ref 96–106)
Creatinine, Ser: 0.66 mg/dL (ref 0.57–1.00)
GFR calc Af Amer: 126 mL/min/{1.73_m2} (ref >59)
GFR calc non Af Amer: 109 mL/min/{1.73_m2} (ref >59)
Globulin, Total: 3.8 g/dL (ref 1.5–4.5)
Glucose: 83 mg/dL (ref 65–99)
Potassium: 4 mmol/L (ref 3.5–5.2)
Sodium: 141 mmol/L (ref 134–144)
Total Protein: 7.7 g/dL (ref 6.0–8.5)

## 2019-07-27 LAB — VITAMIN B12: Vitamin B-12: 795 pg/mL (ref 232–1245)

## 2019-07-27 LAB — MAGNESIUM: Magnesium: 1.9 mg/dL (ref 1.6–2.3)

## 2019-09-28 ENCOUNTER — Other Ambulatory Visit: Payer: Self-pay

## 2019-09-28 ENCOUNTER — Encounter (HOSPITAL_COMMUNITY): Payer: Self-pay

## 2019-09-28 ENCOUNTER — Emergency Department (HOSPITAL_COMMUNITY)
Admission: EM | Admit: 2019-09-28 | Discharge: 2019-09-28 | Disposition: A | Discharge status: Home / Self Care | Payer: Self-pay | Attending: Emergency Medicine | Admitting: Emergency Medicine

## 2019-09-28 DIAGNOSIS — Z79899 Other long term (current) drug therapy: Secondary | ICD-10-CM | POA: Insufficient documentation

## 2019-09-28 DIAGNOSIS — R519 Headache, unspecified: Secondary | ICD-10-CM | POA: Insufficient documentation

## 2019-09-28 DIAGNOSIS — Z87891 Personal history of nicotine dependence: Secondary | ICD-10-CM | POA: Insufficient documentation

## 2019-09-28 DIAGNOSIS — F1210 Cannabis abuse, uncomplicated: Secondary | ICD-10-CM | POA: Insufficient documentation

## 2019-09-28 DIAGNOSIS — I10 Essential (primary) hypertension: Secondary | ICD-10-CM | POA: Insufficient documentation

## 2019-09-28 DIAGNOSIS — F1410 Cocaine abuse, uncomplicated: Secondary | ICD-10-CM | POA: Insufficient documentation

## 2019-09-28 DIAGNOSIS — F10920 Alcohol use, unspecified with intoxication, uncomplicated: Secondary | ICD-10-CM | POA: Insufficient documentation

## 2019-09-28 LAB — CBC WITH DIFFERENTIAL/PLATELET
Abs Immature Granulocytes: 0.01 10*3/uL (ref 0.00–0.07)
Basophils Absolute: 0 10*3/uL (ref 0.0–0.1)
Basophils Relative: 0 %
Eosinophils Absolute: 0 10*3/uL (ref 0.0–0.5)
Eosinophils Relative: 0 %
HCT: 29.5 % — ABNORMAL LOW (ref 36.0–46.0)
Hemoglobin: 9.1 g/dL — ABNORMAL LOW (ref 12.0–15.0)
Immature Granulocytes: 0 %
Lymphocytes Relative: 24 %
Lymphs Abs: 0.8 10*3/uL (ref 0.7–4.0)
MCH: 26 pg (ref 26.0–34.0)
MCHC: 30.8 g/dL (ref 30.0–36.0)
MCV: 84.3 fL (ref 80.0–100.0)
Monocytes Absolute: 0.2 10*3/uL (ref 0.1–1.0)
Monocytes Relative: 7 %
Neutro Abs: 2.2 10*3/uL (ref 1.7–7.7)
Neutrophils Relative %: 69 %
Platelets: 73 10*3/uL — ABNORMAL LOW (ref 150–400)
RBC: 3.5 MIL/uL — ABNORMAL LOW (ref 3.87–5.11)
RDW: 19.9 % — ABNORMAL HIGH (ref 11.5–15.5)
WBC: 3.3 10*3/uL — ABNORMAL LOW (ref 4.0–10.5)
nRBC: 0 % (ref 0.0–0.2)

## 2019-09-28 LAB — COMPREHENSIVE METABOLIC PANEL
ALT: 16 U/L (ref 0–44)
AST: 26 U/L (ref 15–41)
Albumin: 3.5 g/dL (ref 3.5–5.0)
Alkaline Phosphatase: 74 U/L (ref 38–126)
Anion gap: 11 (ref 5–15)
BUN: 7 mg/dL (ref 6–20)
CO2: 20 mmol/L — ABNORMAL LOW (ref 22–32)
Calcium: 8.1 mg/dL — ABNORMAL LOW (ref 8.9–10.3)
Chloride: 109 mmol/L (ref 98–111)
Creatinine, Ser: 1.06 mg/dL — ABNORMAL HIGH (ref 0.44–1.00)
GFR calc Af Amer: >60 mL/min (ref >60)
GFR calc non Af Amer: >60 mL/min (ref >60)
Glucose, Bld: 124 mg/dL — ABNORMAL HIGH (ref 70–99)
Potassium: 2.8 mmol/L — ABNORMAL LOW (ref 3.5–5.1)
Sodium: 140 mmol/L (ref 135–145)
Total Bilirubin: 1.2 mg/dL (ref 0.3–1.2)
Total Protein: 6.8 g/dL (ref 6.5–8.1)

## 2019-09-28 LAB — RAPID URINE DRUG SCREEN, HOSP PERFORMED
Amphetamines: NOT DETECTED
Barbiturates: NOT DETECTED
Benzodiazepines: NOT DETECTED
Cocaine: NOT DETECTED
Opiates: NOT DETECTED
Tetrahydrocannabinol: NOT DETECTED

## 2019-09-28 LAB — PROTIME-INR
INR: 1.3 — ABNORMAL HIGH (ref 0.8–1.2)
Prothrombin Time: 16 seconds — ABNORMAL HIGH (ref 11.4–15.2)

## 2019-09-28 LAB — I-STAT BETA HCG BLOOD, ED (MC, WL, AP ONLY): I-stat hCG, quantitative: <5 m[IU]/mL (ref <5)

## 2019-09-28 LAB — MAGNESIUM: Magnesium: 1.7 mg/dL (ref 1.7–2.4)

## 2019-09-28 LAB — ETHANOL: Alcohol, Ethyl (B): 24 mg/dL — ABNORMAL HIGH (ref <10)

## 2019-09-28 LAB — ACETAMINOPHEN LEVEL: Acetaminophen (Tylenol), Serum: <10 ug/mL — ABNORMAL LOW (ref 10–30)

## 2019-09-28 LAB — CBG MONITORING, ED: Glucose-Capillary: 109 mg/dL — ABNORMAL HIGH (ref 70–99)

## 2019-09-28 LAB — SALICYLATE LEVEL: Salicylate Lvl: <7 mg/dL (ref 2.8–30.0)

## 2019-09-28 MED ORDER — POTASSIUM CHLORIDE CRYS ER 20 MEQ PO TBCR
40.0000 meq | EXTENDED_RELEASE_TABLET | Freq: Once | ORAL | Status: AC
  Administered 2019-09-28: 40 meq via ORAL
  Filled 2019-09-28: qty 2

## 2019-09-28 MED ORDER — POTASSIUM CHLORIDE 10 MEQ/100ML IV SOLN
10.0000 meq | Freq: Once | INTRAVENOUS | Status: AC
  Administered 2019-09-28: 10 meq via INTRAVENOUS
  Filled 2019-09-28: qty 100

## 2019-09-28 MED ORDER — SODIUM CHLORIDE 0.9 % IV BOLUS
1000.0000 mL | Freq: Once | INTRAVENOUS | Status: AC
  Administered 2019-09-28: 1000 mL via INTRAVENOUS

## 2019-09-28 MED ORDER — CHLORDIAZEPOXIDE HCL 25 MG PO CAPS: ORAL_CAPSULE | ORAL | 0 refills | Status: DC

## 2019-09-28 MED ORDER — THIAMINE HCL 100 MG/ML IJ SOLN
100.0000 mg | Freq: Once | INTRAMUSCULAR | Status: AC
  Administered 2019-09-28: 100 mg via INTRAVENOUS
  Filled 2019-09-28: qty 2
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
## 2019-09-28 NOTE — ED Notes (Signed)
Poison control call and given lab result and EKG result. Poison control state patient is clear at their end.

## 2019-09-28 NOTE — ED Notes (Signed)
Pt attempted to ambulate to the restroom but became dizzy and was unable to leave her room. Pt was assisted back to bed and purewick placed so she can urinate.

## 2019-09-28 NOTE — ED Triage Notes (Signed)
Pt from home via EMS. Pt reports to EMS that she drank approx 1 bottle of isopropyl rubbing alcohol over the past 5 hours. EMS witnessed pt drinking a 12 oz beer prior to transport. Pt has hx of esophageal varices and liver disease. Pt husband reports that she left rehab last week and has been ingesting hand sanitizer. Pt is A&O x4. Pt in NAD. Pt VSS

## 2019-09-28 NOTE — ED Notes (Signed)
Pt had a cup of water done well 

## 2019-09-28 NOTE — Discharge Instructions (Signed)
I have provided outpatient resources to help with substance abuse.  Additionally, we have started you on Librium.  As we discussed, do not ring anymore rubbing alcohol or hand sanitizer.  Return the emergency department for any chest pain, difficulty breathing, abdominal pain, vomiting.

## 2019-10-10 ENCOUNTER — Telehealth: Payer: Self-pay | Admitting: Hematology and Oncology

## 2019-10-10 NOTE — Telephone Encounter (Signed)
Returned patient's phone call regarding rescheduling an appointment, per patient's request 05/14 appointment has moved to 12/01.

## 2019-10-21 ENCOUNTER — Other Ambulatory Visit: Payer: Self-pay | Admitting: *Deleted

## 2019-10-21 DIAGNOSIS — D61818 Other pancytopenia: Secondary | ICD-10-CM

## 2019-10-21 NOTE — Progress Notes (Signed)
Patient Care Team: Kerin Perna, NP as PCP - General (Internal Medicine) Leandrew Koyanagi, MD (Family Medicine)  DIAGNOSIS:    ICD-10-CM   1. Pancytopenia ([place])  D61.818     CHIEF COMPLIANT: Follow-up of pancytopenia due to alcoholism  INTERVAL HISTORY: Sara [name] is a 43 y.o. with above-mentioned history of alcoholic cirrhosis with pancytopenia. I last saw her 9 months ago. In the interim she presented to the ED 9 times and was admitted twice. She presents to the clinic today for follow-up.  She continues to drink alcohol.  Today her blood counts showed decreased white cells and platelets.  She has also been chronically anemic.  She feels very tired.  She tells me that she has not been able to eat good food because of financial issues.  REVIEW OF SYSTEMS:   Constitutional: Denies fevers, chills or abnormal weight loss Eyes: Denies blurriness of vision Ears, nose, mouth, throat, and face: Denies mucositis or sore throat Respiratory: Denies cough, dyspnea or wheezes Cardiovascular: Denies palpitation, chest discomfort Gastrointestinal: Denies nausea, heartburn or change in bowel habits Skin: Denies abnormal skin rashes Lymphatics: Denies new lymphadenopathy or easy bruising Neurological: Denies numbness, tingling or new weaknesses Behavioral/Psych: Mood is stable, no new changes  Extremities: No lower extremity edema Breast: denies any pain or lumps or nodules in either breasts All other systems were reviewed with the patient and are negative.  I have reviewed the past medical history, past surgical history, social history and family history with the patient and they are unchanged from previous note.  ALLERGIES:  is allergic to morphine and related and nsaids.  MEDICATIONS:  Current Outpatient Medications  Medication Sig Dispense Refill   chlordiazePOXIDE (LIBRIUM) 25 MG capsule 68m PO TID x 1D, then 25-548mPO BID X 1D, then 25-5061mO QD X 1D 10 capsule 0    escitalopram (LEXAPRO) 20 MG tablet Take 1 tablet (20 mg total) by mouth daily. (Patient not taking: Reported on 09/28/2019) 30 tablet 0   QUEtiapine (SEROQUEL) 100 MG tablet Take 1 tablet (100 mg total) by mouth at bedtime. (Patient not taking: Reported on 09/28/2019) 30 tablet 0   topiramate (TOPAMAX) 50 MG tablet Take 1 tablet (50 mg total) by mouth 2 (two) times daily. (Patient not taking: Reported on 09/28/2019) 60 tablet 0   No current facility-administered medications for this visit.     PHYSICAL EXAMINATION: ECOG PERFORMANCE STATUS: 1 - Symptomatic but completely ambulatory  Vitals:   10/22/19 1535  BP: 123/75  Pulse: 71  Resp: 20  Temp: 98.3 F (36.8 C)  SpO2: 97%   Filed Weights   10/22/19 1535  Weight: 160 lb 6.4 oz (72.8 kg)    GENERAL: alert, no distress and comfortable SKIN: skin color, texture, turgor are normal, no rashes or significant lesions EYES: normal, Conjunctiva are pink and non-injected, sclera clear OROPHARYNX: no exudate, no erythema and lips, buccal mucosa, and tongue normal  NECK: supple, thyroid normal size, non-tender, without nodularity LYMPH: no palpable lymphadenopathy in the cervical, axillary or inguinal LUNGS: clear to auscultation and percussion with normal breathing effort HEART: regular rate & rhythm and no murmurs and no lower extremity edema ABDOMEN: abdomen soft, non-tender and normal bowel sounds MUSCULOSKELETAL: no cyanosis of digits and no clubbing  NEURO: alert & oriented x 3 with fluent speech, no focal motor/sensory deficits EXTREMITIES: No lower extremity edema  LABORATORY DATA:  I have reviewed the data as listed CMP Latest Ref Rng & Units 10/22/2019 09/28/2019 07/26/2019  Glucose 70 - 99 mg/dL 81 124(H) 83  BUN 6 - 20 mg/dL '7 7 6  ' Creatinine 0.44 - 1.00 mg/dL 0.73 1.06(H) 0.66  Sodium 135 - 145 mmol/L 138 140 141  Potassium 3.5 - 5.1 mmol/L 3.9 2.8(L) 4.0  Chloride 98 - 111 mmol/L 108 109 110(H)  CO2 22 - 32 mmol/L 19(L)  20(L) 18(L)  Calcium 8.9 - 10.3 mg/dL 8.6(L) 8.1(L) 8.9  Total Protein 6.5 - 8.1 g/dL 7.7 6.8 7.7  Total Bilirubin 0.3 - 1.2 mg/dL 0.8 1.2 0.7  Alkaline Phos 38 - 126 U/L 103 74 92  AST 15 - 41 U/L 36 26 31  ALT 0 - 44 U/L '22 16 23    ' Lab Results  Component Value Date   WBC 1.7 (L) 10/22/2019   HGB 9.5 (L) 10/22/2019   HCT 30.2 (L) 10/22/2019   MCV 83.4 10/22/2019   PLT 51 (L) 10/22/2019   NEUTROABS 0.9 (L) 10/22/2019    ASSESSMENT & PLAN:  Pancytopenia ([place]) Pancytopenia due to alcohol abuse Leukopenia: Primarily neutropenia  Workup: 1. CBC with differential09/25/2018: WBC 2.7, ANC 1.5, hemoglobin 10, platelets 69 2. M-41 and folic acid levels: Normal 3. ANA: Negative 4. Flow cytometry: No monoclonalB orT-cell aberrant populations identified 5.Iron studies iron saturation 6%, TIBC 510, ferritin 95. (Given IV iron October 2018)  Recommendation: Bone marrow biopsy on 09/26/2017: Slightly hypercellular(50-70%)bone marrow for age with trilineage hematopoiesis, mild plasmacytosis 4%  The pancytopenia is directly attributable to her alcoholism and cirrhosis of the liver.  Lab review: WBC 1.7, ANC 0.9, hemoglobin 9.5, platelets 51  I reiterated the importance of alcohol cessation. However the patient has still not quit drinking.  I instructed her to stop drinking for the next 2 weeks and come back to recheck her CBC.  I suspect that her blood counts will be better.  If they are not better then we may have to consider giving G-CSF injection.  No orders of the defined types were placed in this encounter.  The patient has a good understanding of the overall plan. she agrees with it. she will call with any problems that may develop before the next visit here.  Nicholas Lose, MD 10/22/2019  Julious Oka Dorshimer, am acting as scribe for Dr. Nicholas Lose.  I have reviewed the above documentation for accuracy and completeness, and I agree with the above.

## 2019-10-22 ENCOUNTER — Inpatient Hospital Stay: Payer: Self-pay | Attending: Hematology and Oncology | Admitting: Hematology and Oncology

## 2019-10-22 ENCOUNTER — Inpatient Hospital Stay: Payer: Self-pay

## 2019-10-22 ENCOUNTER — Other Ambulatory Visit: Payer: Self-pay

## 2019-10-22 DIAGNOSIS — D61818 Other pancytopenia: Secondary | ICD-10-CM | POA: Insufficient documentation

## 2019-10-22 DIAGNOSIS — K7030 Alcoholic cirrhosis of liver without ascites: Secondary | ICD-10-CM | POA: Insufficient documentation

## 2019-10-22 DIAGNOSIS — F1010 Alcohol abuse, uncomplicated: Secondary | ICD-10-CM | POA: Insufficient documentation

## 2019-10-22 DIAGNOSIS — Z885 Allergy status to narcotic agent status: Secondary | ICD-10-CM | POA: Insufficient documentation

## 2019-10-22 DIAGNOSIS — Z79899 Other long term (current) drug therapy: Secondary | ICD-10-CM | POA: Insufficient documentation

## 2019-10-22 DIAGNOSIS — D649 Anemia, unspecified: Secondary | ICD-10-CM | POA: Insufficient documentation

## 2019-10-22 DIAGNOSIS — Z886 Allergy status to analgesic agent status: Secondary | ICD-10-CM | POA: Insufficient documentation

## 2019-10-22 DIAGNOSIS — R5383 Other fatigue: Secondary | ICD-10-CM | POA: Insufficient documentation

## 2019-10-22 LAB — CBC WITH DIFFERENTIAL (CANCER CENTER ONLY)
Abs Immature Granulocytes: 0.02 10*3/uL (ref 0.00–0.07)
Basophils Absolute: 0 10*3/uL (ref 0.0–0.1)
Basophils Relative: 0 %
Eosinophils Absolute: 0 10*3/uL (ref 0.0–0.5)
Eosinophils Relative: 1 %
HCT: 30.2 % — ABNORMAL LOW (ref 36.0–46.0)
Hemoglobin: 9.5 g/dL — ABNORMAL LOW (ref 12.0–15.0)
Immature Granulocytes: 1 %
Lymphocytes Relative: 40 %
Lymphs Abs: 0.7 10*3/uL (ref 0.7–4.0)
MCH: 26.2 pg (ref 26.0–34.0)
MCHC: 31.5 g/dL (ref 30.0–36.0)
MCV: 83.4 fL (ref 80.0–100.0)
Monocytes Absolute: 0.1 10*3/uL (ref 0.1–1.0)
Monocytes Relative: 7 %
Neutro Abs: 0.9 10*3/uL — ABNORMAL LOW (ref 1.7–7.7)
Neutrophils Relative %: 51 %
Platelet Count: 51 10*3/uL — ABNORMAL LOW (ref 150–400)
RBC: 3.62 MIL/uL — ABNORMAL LOW (ref 3.87–5.11)
RDW: 18.2 % — ABNORMAL HIGH (ref 11.5–15.5)
WBC Count: 1.7 10*3/uL — ABNORMAL LOW (ref 4.0–10.5)
nRBC: 0 % (ref 0.0–0.2)

## 2019-10-22 LAB — CMP (CANCER CENTER ONLY)
ALT: 22 U/L (ref 0–44)
AST: 36 U/L (ref 15–41)
Albumin: 3.9 g/dL (ref 3.5–5.0)
Alkaline Phosphatase: 103 U/L (ref 38–126)
Anion gap: 11 (ref 5–15)
BUN: 7 mg/dL (ref 6–20)
CO2: 19 mmol/L — ABNORMAL LOW (ref 22–32)
Calcium: 8.6 mg/dL — ABNORMAL LOW (ref 8.9–10.3)
Chloride: 108 mmol/L (ref 98–111)
Creatinine: 0.73 mg/dL (ref 0.44–1.00)
GFR, Est AFR Am: >60 mL/min (ref >60)
GFR, Estimated: >60 mL/min (ref >60)
Glucose, Bld: 81 mg/dL (ref 70–99)
Potassium: 3.9 mmol/L (ref 3.5–5.1)
Sodium: 138 mmol/L (ref 135–145)
Total Bilirubin: 0.8 mg/dL (ref 0.3–1.2)
Total Protein: 7.7 g/dL (ref 6.5–8.1)

## 2019-10-22 NOTE — Assessment & Plan Note (Signed)
Pancytopenia multifactorial etiology Leukopenia: Primarily neutropenia  Workup: 1. CBC with differential09/25/2018: WBC 2.7, ANC 1.5, hemoglobin 10, platelets 69 2. X-95 and folic acid levels: Normal 3. ANA: Negative 4. Flow cytometry: No monoclonalB orT-cell aberrant populations identified 5.Iron studies iron saturation 6%, TIBC 510, ferritin 95. (Given IV iron October 2018)  Recommendation: Bone marrow biopsy on 09/26/2017: Slightly hypercellular(50-70%)bone marrow for age with trilineage hematopoiesis, mild plasmacytosis 4%  The pancytopenia is directly attributable to her alcoholism and cirrhosis of the liver.  Lab review:  I reiterated the importance of alcohol cessation. However the patient has still not quit drinking.

## 2019-10-23 ENCOUNTER — Other Ambulatory Visit: Payer: Self-pay

## 2019-10-23 ENCOUNTER — Telehealth: Payer: Self-pay | Admitting: Hematology and Oncology

## 2019-10-23 NOTE — Telephone Encounter (Signed)
I talk with patient regarding schedule  

## 2019-11-05 ENCOUNTER — Other Ambulatory Visit: Payer: Self-pay

## 2019-11-05 ENCOUNTER — Inpatient Hospital Stay (HOSPITAL_BASED_OUTPATIENT_CLINIC_OR_DEPARTMENT_OTHER): Payer: Self-pay | Admitting: Hematology and Oncology

## 2019-11-05 ENCOUNTER — Inpatient Hospital Stay: Payer: Self-pay

## 2019-11-05 DIAGNOSIS — D61818 Other pancytopenia: Secondary | ICD-10-CM

## 2019-11-05 LAB — CBC WITH DIFFERENTIAL (CANCER CENTER ONLY)
Abs Immature Granulocytes: 0 10*3/uL (ref 0.00–0.07)
Basophils Absolute: 0 10*3/uL (ref 0.0–0.1)
Basophils Relative: 1 %
Eosinophils Absolute: 0 10*3/uL (ref 0.0–0.5)
Eosinophils Relative: 1 %
HCT: 31.1 % — ABNORMAL LOW (ref 36.0–46.0)
Hemoglobin: 9.3 g/dL — ABNORMAL LOW (ref 12.0–15.0)
Immature Granulocytes: 0 %
Lymphocytes Relative: 50 %
Lymphs Abs: 0.9 10*3/uL (ref 0.7–4.0)
MCH: 25.8 pg — ABNORMAL LOW (ref 26.0–34.0)
MCHC: 29.9 g/dL — ABNORMAL LOW (ref 30.0–36.0)
MCV: 86.4 fL (ref 80.0–100.0)
Monocytes Absolute: 0.1 10*3/uL (ref 0.1–1.0)
Monocytes Relative: 5 %
Neutro Abs: 0.8 10*3/uL — ABNORMAL LOW (ref 1.7–7.7)
Neutrophils Relative %: 43 %
Platelet Count: 56 10*3/uL — ABNORMAL LOW (ref 150–400)
RBC: 3.6 MIL/uL — ABNORMAL LOW (ref 3.87–5.11)
RDW: 18.9 % — ABNORMAL HIGH (ref 11.5–15.5)
WBC Count: 1.8 10*3/uL — ABNORMAL LOW (ref 4.0–10.5)
nRBC: 0 % (ref 0.0–0.2)

## 2019-11-05 LAB — FOLATE: Folate: 8.6 ng/mL (ref >5.9)

## 2019-11-05 LAB — VITAMIN B12: Vitamin B-12: 399 pg/mL (ref 180–914)

## 2019-11-05 NOTE — Assessment & Plan Note (Signed)
Pancytopenia due to alcohol abuse Leukopenia: Primarily neutropenia  Workup: 1. CBC with differential09/25/2018: WBC 2.7, ANC 1.5, hemoglobin 10, platelets 69 2. M-10 and folic acid levels: Normal 3. ANA: Negative 4. Flow cytometry: No monoclonalB orT-cell aberrant populations identified 5.Iron studies iron saturation 6%, TIBC 510, ferritin 95. (Given IV iron October 2018)  Recommendation: Bone marrow biopsy on 09/26/2017: Slightly hypercellular(50-70%)bone marrow for age with trilineage hematopoiesis, mild plasmacytosis 4%  The pancytopenia is directly attributable to her alcoholism and cirrhosis of the liver.  Lab review: WBC 1.7, ANC 0.9, hemoglobin 9.5, platelets 51  I reiterated the importance of alcohol cessation. However the patient has still not quit drinking.  I instructed her to stop drinking for the next 2 weeks and come back to recheck her CBC.  I suspect that her blood counts will be better.  If they are not better then we may have to consider giving G-CSF injection.

## 2019-11-05 NOTE — Progress Notes (Signed)
Patient Care Team: Kerin Perna, NP as PCP - General (Internal Medicine) Leandrew Koyanagi, MD (Family Medicine)  DIAGNOSIS:    ICD-10-CM   1. Pancytopenia ([place])  D61.818     CHIEF COMPLIANT: Follow-up of pancytopenia due to alcoholism  INTERVAL HISTORY: Sara [name] is a 43 y.o. with above-mentioned history of alcoholic cirrhosis with pancytopenia. She presents to the clinic today for follow-up to recheck her labs.  She feels tired all the time and cloudiness in the head which is probably related to her other medications.  REVIEW OF SYSTEMS:   Constitutional: Denies fevers, chills or abnormal weight loss Eyes: Denies blurriness of vision Ears, nose, mouth, throat, and face: Denies mucositis or sore throat Respiratory: Denies cough, dyspnea or wheezes Cardiovascular: Denies palpitation, chest discomfort Gastrointestinal: Denies nausea, heartburn or change in bowel habits Skin: Denies abnormal skin rashes Lymphatics: Denies new lymphadenopathy or easy bruising Neurological: Denies numbness, tingling or new weaknesses Behavioral/Psych: Mood is stable, no new changes  Extremities: No lower extremity edema Breast: denies any pain or lumps or nodules in either breasts All other systems were reviewed with the patient and are negative.  I have reviewed the past medical history, past surgical history, social history and family history with the patient and they are unchanged from previous note.  ALLERGIES:  is allergic to morphine and related and nsaids.  MEDICATIONS:  Current Outpatient Medications  Medication Sig Dispense Refill  . escitalopram (LEXAPRO) 20 MG tablet Take 1 tablet (20 mg total) by mouth daily. (Patient not taking: Reported on 09/28/2019) 30 tablet 0  . QUEtiapine (SEROQUEL) 100 MG tablet Take 1 tablet (100 mg total) by mouth at bedtime. (Patient not taking: Reported on 09/28/2019) 30 tablet 0  . topiramate (TOPAMAX) 50 MG tablet Take 1 tablet (50 mg total)  by mouth 2 (two) times daily. (Patient not taking: Reported on 09/28/2019) 60 tablet 0   No current facility-administered medications for this visit.    PHYSICAL EXAMINATION: ECOG PERFORMANCE STATUS: 1 - Symptomatic but completely ambulatory  Vitals:   11/05/19 1500  BP: 108/64  Pulse: 78  Resp: 20  Temp: 98.7 F (37.1 C)  SpO2: 96%   Filed Weights   11/05/19 1500  Weight: 161 lb 6.4 oz (73.2 kg)    GENERAL: alert, no distress and comfortable SKIN: skin color, texture, turgor are normal, no rashes or significant lesions EYES: normal, Conjunctiva are pink and non-injected, sclera clear OROPHARYNX: no exudate, no erythema and lips, buccal mucosa, and tongue normal  NECK: supple, thyroid normal size, non-tender, without nodularity LYMPH: no palpable lymphadenopathy in the cervical, axillary or inguinal LUNGS: clear to auscultation and percussion with normal breathing effort HEART: regular rate & rhythm and no murmurs and no lower extremity edema ABDOMEN: abdomen soft, non-tender and normal bowel sounds MUSCULOSKELETAL: no cyanosis of digits and no clubbing  NEURO: alert & oriented x 3 with fluent speech, no focal motor/sensory deficits EXTREMITIES: No lower extremity edema  LABORATORY DATA:  I have reviewed the data as listed CMP Latest Ref Rng & Units 10/22/2019 09/28/2019 07/26/2019  Glucose 70 - 99 mg/dL 81 124(H) 83  BUN 6 - 20 mg/dL '7 7 6  ' Creatinine 0.44 - 1.00 mg/dL 0.73 1.06(H) 0.66  Sodium 135 - 145 mmol/L 138 140 141  Potassium 3.5 - 5.1 mmol/L 3.9 2.8(L) 4.0  Chloride 98 - 111 mmol/L 108 109 110(H)  CO2 22 - 32 mmol/L 19(L) 20(L) 18(L)  Calcium 8.9 - 10.3 mg/dL 8.6(L) 8.1(L) 8.9  Total Protein 6.5 - 8.1 g/dL 7.7 6.8 7.7  Total Bilirubin 0.3 - 1.2 mg/dL 0.8 1.2 0.7  Alkaline Phos 38 - 126 U/L 103 74 92  AST 15 - 41 U/L 36 26 31  ALT 0 - 44 U/L '22 16 23    ' Lab Results  Component Value Date   WBC 1.8 (L) 11/05/2019   HGB 9.3 (L) 11/05/2019   HCT 31.1 (L)  11/05/2019   MCV 86.4 11/05/2019   PLT 56 (L) 11/05/2019   NEUTROABS 0.8 (L) 11/05/2019    ASSESSMENT & PLAN:  Pancytopenia ([place]) Pancytopenia due to alcohol abuse Leukopenia: Primarily neutropenia  Workup: 1. CBC with differential09/25/2018: WBC 2.7, ANC 1.5, hemoglobin 10, platelets 69 2. U-76 and folic acid levels: Normal 3. ANA: Negative 4. Flow cytometry: No monoclonalB orT-cell aberrant populations identified 5.Iron studies iron saturation 6%, TIBC 510, ferritin 95. (Given IV iron October 2018)  Recommendation: Bone marrow biopsy on 09/26/2017: Slightly hypercellular(50-70%)bone marrow for age with trilineage hematopoiesis, mild plasmacytosis 4% The pancytopenia is directly attributable to her alcoholism and cirrhosis of the liver.  Lab review: WBC 1.8, ANC 0.8, hemoglobin 9.3, platelets 5  I reiterated the importance of alcohol cessation. However the patient has still not quit drinking.  Cloudiness in the head and fatigue: Related to her other medications and her comorbidities. We discussed the pros and cons of giving G-CSF injections.  At this point because she is not having any risk of infections we will watch and monitor. If she does develop frequent infections and we will give her growth factor injections.  Return to clinic in 3 months with labs and follow-up.   No orders of the defined types were placed in this encounter.  The patient has a good understanding of the overall plan. she agrees with it. she will call with any problems that may develop before the next visit here.  Nicholas Lose, MD 11/05/2019  Julious Oka Dorshimer, am acting as scribe for Dr. Nicholas Lose.  I have reviewed the above document for accuracy and completeness, and I agree with the above.

## 2019-11-06 ENCOUNTER — Telehealth: Payer: Self-pay | Admitting: Hematology and Oncology

## 2019-11-06 LAB — IRON AND TIBC
Iron: 16 ug/dL — ABNORMAL LOW (ref 41–142)
Saturation Ratios: 3 % — ABNORMAL LOW (ref 21–57)
TIBC: 456 ug/dL — ABNORMAL HIGH (ref 236–444)
UIBC: 441 ug/dL — ABNORMAL HIGH (ref 120–384)

## 2019-11-06 LAB — FERRITIN: Ferritin: 5 ng/mL — ABNORMAL LOW (ref 11–307)

## 2019-11-06 NOTE — Telephone Encounter (Signed)
I talk with patient regarding schedule  

## 2019-11-07 ENCOUNTER — Other Ambulatory Visit: Payer: Self-pay | Admitting: Hematology and Oncology

## 2019-11-07 ENCOUNTER — Telehealth: Payer: Self-pay | Admitting: Hematology and Oncology

## 2019-11-07 NOTE — Telephone Encounter (Signed)
I informed the patient that iron studies show profound iron deficiency. I recommended IV iron therapy. We will ask the schedulers to set up appointments for IV iron treatment.

## 2019-11-08 ENCOUNTER — Telehealth: Payer: Self-pay | Admitting: Hematology and Oncology

## 2019-11-08 NOTE — Telephone Encounter (Signed)
Returned patient's phone call regarding scheduling some appointments, scheduled per 12/17 scheduled message. Patient is notified.

## 2019-11-11 ENCOUNTER — Other Ambulatory Visit: Payer: Self-pay

## 2019-11-11 ENCOUNTER — Inpatient Hospital Stay: Payer: Self-pay

## 2019-11-11 VITALS — BP 99/65 | HR 78 | Temp 98.6°F | Resp 18

## 2019-11-11 DIAGNOSIS — D500 Iron deficiency anemia secondary to blood loss (chronic): Secondary | ICD-10-CM

## 2019-11-11 MED ORDER — ACETAMINOPHEN 325 MG PO TABS
ORAL_TABLET | ORAL | Status: AC
  Filled 2019-11-11: qty 2

## 2019-11-11 MED ORDER — SODIUM CHLORIDE 0.9 % IV SOLN
Freq: Once | INTRAVENOUS | Status: AC
  Filled 2019-11-11: qty 250

## 2019-11-11 MED ORDER — ACETAMINOPHEN 325 MG PO TABS
650.0000 mg | ORAL_TABLET | Freq: Once | ORAL | Status: AC
  Administered 2019-11-11: 650 mg via ORAL

## 2019-11-11 MED ORDER — SODIUM CHLORIDE 0.9 % IV SOLN
200.0000 mg | Freq: Once | INTRAVENOUS | Status: AC
  Administered 2019-11-11: 200 mg via INTRAVENOUS
  Filled 2019-11-11: qty 10

## 2019-11-11 NOTE — Patient Instructions (Signed)

## 2019-11-13 ENCOUNTER — Encounter: Payer: Self-pay | Admitting: Pharmacy Technician

## 2019-11-13 NOTE — Progress Notes (Addendum)
The patient is approved for drug assistance by [employer] Reimbursement Program for Venofer.  Enrollment is from 11/12/19 - 11/10/20 and is based on self-pay.  Drug replacement begins on DOS 11/11/19.

## 2019-11-17 ENCOUNTER — Emergency Department (HOSPITAL_COMMUNITY): Payer: Self-pay

## 2019-11-17 ENCOUNTER — Emergency Department (HOSPITAL_COMMUNITY)
Admission: EM | Admit: 2019-11-17 | Discharge: 2019-11-17 | Disposition: A | Discharge status: Home / Self Care | Payer: Self-pay | Attending: Emergency Medicine | Admitting: Emergency Medicine

## 2019-11-17 ENCOUNTER — Encounter (HOSPITAL_COMMUNITY): Payer: Self-pay | Admitting: Emergency Medicine

## 2019-11-17 ENCOUNTER — Other Ambulatory Visit: Payer: Self-pay

## 2019-11-17 DIAGNOSIS — Y92018 Other place in single-family (private) house as the place of occurrence of the external cause: Secondary | ICD-10-CM | POA: Insufficient documentation

## 2019-11-17 DIAGNOSIS — Y9301 Activity, walking, marching and hiking: Secondary | ICD-10-CM | POA: Insufficient documentation

## 2019-11-17 DIAGNOSIS — W108XXA Fall (on) (from) other stairs and steps, initial encounter: Secondary | ICD-10-CM | POA: Insufficient documentation

## 2019-11-17 DIAGNOSIS — Y999 Unspecified external cause status: Secondary | ICD-10-CM | POA: Insufficient documentation

## 2019-11-17 DIAGNOSIS — W19XXXA Unspecified fall, initial encounter: Secondary | ICD-10-CM

## 2019-11-17 DIAGNOSIS — Z87891 Personal history of nicotine dependence: Secondary | ICD-10-CM | POA: Insufficient documentation

## 2019-11-17 DIAGNOSIS — S300XXA Contusion of lower back and pelvis, initial encounter: Secondary | ICD-10-CM | POA: Insufficient documentation

## 2019-11-17 DIAGNOSIS — S20222A Contusion of left back wall of thorax, initial encounter: Secondary | ICD-10-CM

## 2019-11-17 LAB — POC URINE PREG, ED: Preg Test, Ur: NEGATIVE

## 2019-11-17 MED ORDER — HYDROCODONE-ACETAMINOPHEN 5-325 MG PO TABS
1.0000 | ORAL_TABLET | Freq: Once | ORAL | Status: AC
  Administered 2019-11-17: 1 via ORAL
  Filled 2019-11-17: qty 1

## 2019-11-17 MED ORDER — METHOCARBAMOL 500 MG PO TABS
500.0000 mg | ORAL_TABLET | Freq: Once | ORAL | Status: AC
  Administered 2019-11-17: 500 mg via ORAL
  Filled 2019-11-17: qty 1

## 2019-11-17 MED ORDER — LIDOCAINE 5 % EX PTCH: 1.0000 | MEDICATED_PATCH | CUTANEOUS | 0 refills | Status: DC

## 2019-11-17 MED ORDER — METHOCARBAMOL 500 MG PO TABS: 500.0000 mg | ORAL_TABLET | Freq: Two times a day (BID) | ORAL | 0 refills | Status: DC | PRN

## 2019-11-17 NOTE — ED Notes (Signed)
Pt was able to ambulate independently with steady gait.

## 2019-11-17 NOTE — ED Notes (Signed)
Discharge instructions and medications reviewed. Pt A/O x4 and ambulatory at discharge.

## 2019-11-17 NOTE — ED Notes (Signed)
Patient walked to bathroom with a slight limp and no assistance.

## 2019-11-17 NOTE — Discharge Instructions (Addendum)
Alternate ice and heat to areas of injury 3-4 times per day to limit inflammation and spasm.  Avoid strenuous activity and heavy lifting.  We recommend consistent use of Tylenol in addition to Robaxin for muscle spasms.  Do not drive or drink alcohol after taking Robaxin as it may make you drowsy and impair your judgment.  You may also use lidoderm patches for pain control.  We recommend follow-up with a primary care doctor to ensure resolution of symptoms.  Return to the ED for any new or concerning symptoms.

## 2019-11-17 NOTE — ED Triage Notes (Signed)
Patient is complaining of pain in the left lower back that shoots down her left leg. Patient states she fell down the stairs today at 6pm.
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
## 2019-11-18 ENCOUNTER — Inpatient Hospital Stay: Payer: Self-pay

## 2019-11-25 ENCOUNTER — Other Ambulatory Visit: Payer: Self-pay

## 2019-11-25 ENCOUNTER — Inpatient Hospital Stay: Payer: Self-pay | Attending: Hematology and Oncology

## 2019-11-25 VITALS — BP 89/64 | HR 69 | Temp 98.1°F | Resp 17

## 2019-11-25 DIAGNOSIS — D500 Iron deficiency anemia secondary to blood loss (chronic): Secondary | ICD-10-CM

## 2019-11-25 DIAGNOSIS — D61818 Other pancytopenia: Secondary | ICD-10-CM | POA: Insufficient documentation

## 2019-11-25 DIAGNOSIS — F1010 Alcohol abuse, uncomplicated: Secondary | ICD-10-CM | POA: Insufficient documentation

## 2019-11-25 DIAGNOSIS — D72819 Decreased white blood cell count, unspecified: Secondary | ICD-10-CM | POA: Insufficient documentation

## 2019-11-25 DIAGNOSIS — Z79899 Other long term (current) drug therapy: Secondary | ICD-10-CM | POA: Insufficient documentation

## 2019-11-25 MED ORDER — OXYCODONE-ACETAMINOPHEN 5-325 MG PO TABS
ORAL_TABLET | ORAL | Status: AC
  Filled 2019-11-25: qty 1

## 2019-11-25 MED ORDER — ACETAMINOPHEN 325 MG PO TABS: 650.0000 mg | ORAL_TABLET | Freq: Once | ORAL | Status: DC

## 2019-11-25 MED ORDER — OXYCODONE-ACETAMINOPHEN 5-325 MG PO TABS
1.0000 | ORAL_TABLET | Freq: Once | ORAL | Status: AC
  Administered 2019-11-25: 1 via ORAL

## 2019-11-25 MED ORDER — SODIUM CHLORIDE 0.9 % IV SOLN
Freq: Once | INTRAVENOUS | Status: AC
  Filled 2019-11-25: qty 250

## 2019-11-25 MED ORDER — ACETAMINOPHEN 325 MG PO TABS
ORAL_TABLET | ORAL | Status: AC
  Filled 2019-11-25: qty 2

## 2019-11-25 MED ORDER — SODIUM CHLORIDE 0.9 % IV SOLN
200.0000 mg | Freq: Once | INTRAVENOUS | Status: AC
  Administered 2019-11-25: 200 mg via INTRAVENOUS
  Filled 2019-11-25: qty 200

## 2019-11-25 NOTE — Patient Instructions (Signed)

## 2019-12-02 ENCOUNTER — Other Ambulatory Visit: Payer: Self-pay

## 2019-12-02 ENCOUNTER — Inpatient Hospital Stay: Payer: Self-pay

## 2019-12-02 VITALS — BP 103/65 | HR 68 | Temp 98.2°F | Resp 16

## 2019-12-02 DIAGNOSIS — D500 Iron deficiency anemia secondary to blood loss (chronic): Secondary | ICD-10-CM

## 2019-12-02 MED ORDER — ACETAMINOPHEN 325 MG PO TABS
ORAL_TABLET | ORAL | Status: AC
  Filled 2019-12-02: qty 2

## 2019-12-02 MED ORDER — SODIUM CHLORIDE 0.9 % IV SOLN
Freq: Once | INTRAVENOUS | Status: AC
  Filled 2019-12-02: qty 250

## 2019-12-02 MED ORDER — SODIUM CHLORIDE 0.9 % IV SOLN
200.0000 mg | Freq: Once | INTRAVENOUS | Status: AC
  Administered 2019-12-02: 200 mg via INTRAVENOUS
  Filled 2019-12-02: qty 200

## 2019-12-02 MED ORDER — ACETAMINOPHEN 325 MG PO TABS
650.0000 mg | ORAL_TABLET | Freq: Once | ORAL | Status: AC
  Administered 2019-12-02: 650 mg via ORAL

## 2019-12-02 NOTE — Patient Instructions (Signed)

## 2020-02-03 NOTE — Progress Notes (Signed)
Patient Care Team: Kerin Perna, NP as PCP - General (Internal Medicine) Leandrew Koyanagi, MD (Family Medicine)  DIAGNOSIS:    ICD-10-CM   1. Pancytopenia ([facility])  D61.818 Ferritin    Iron and TIBC    CBC with Differential (Cancer Center Only)    Vitamin B12    Folate, Serum    CMP ([facility] only)    CHIEF COMPLIANT: Follow-up of pancytopenia due to alcoholism, iron deficiency anemia  INTERVAL HISTORY: Sara [name] is a 44 y.o. with above-mentioned history of alcoholic cirrhosis with pancytopenia and iron deficiency anemia treated with IV iron. She presents to the clinic todayfor follow-up to recheck her labs.  She tells me that she is cut down on alcohol consumption.  She is seeing her psychiatrist as well as attending [facility] meetings.  She tells me that she has a lot of stress on her.  Her husband is in the hospital with cellulitis.  ALLERGIES:  is allergic to morphine and related and nsaids.  MEDICATIONS:  Current Outpatient Medications  Medication Sig Dispense Refill  . escitalopram (LEXAPRO) 20 MG tablet Take 1 tablet (20 mg total) by mouth daily. 30 tablet 0  . lidocaine (LIDODERM) 5 % Place 1 patch onto the skin daily. Remove & Discard patch within 12 hours or as directed by MD 30 patch 0  . methocarbamol (ROBAXIN) 500 MG tablet Take 1 tablet (500 mg total) by mouth every 12 (twelve) hours as needed for muscle spasms. 20 tablet 0  . QUEtiapine (SEROQUEL) 100 MG tablet Take 1 tablet (100 mg total) by mouth at bedtime. 30 tablet 0  . topiramate (TOPAMAX) 50 MG tablet Take 1 tablet (50 mg total) by mouth 2 (two) times daily. 60 tablet 0   No current facility-administered medications for this visit.    PHYSICAL EXAMINATION: ECOG PERFORMANCE STATUS: 2 - Symptomatic, <50% confined to bed  Vitals:   02/04/20 1134  BP: 106/60  Pulse: 79  Resp: 16  Temp: 98.5 F (36.9 C)  SpO2: 93%   Filed Weights   02/04/20 1134  Weight: 164 lb 14.4 oz (74.8 kg)     LABORATORY DATA:  I have reviewed the data as listed CMP Latest Ref Rng & Units 10/22/2019 09/28/2019 07/26/2019  Glucose 70 - 99 mg/dL 81 124(H) 83  BUN 6 - 20 mg/dL _0 Creatinine 0.44 - 1.00 mg/dL 0.73 1.06(H) 0.66  Sodium 135 - 145 mmol/L 138 140 141  Potassium 3.5 - 5.1 mmol/L 3.9 2.8(L) 4.0  Chloride 98 - 111 mmol/L 108 109 110(H)  CO2 22 - 32 mmol/L 19(L) 20(L) 18(L)  Calcium 8.9 - 10.3 mg/dL 8.6(L) 8.1(L) 8.9  Total Protein 6.5 - 8.1 g/dL 7.7 6.8 7.7  Total Bilirubin 0.3 - 1.2 mg/dL 0.8 1.2 0.7  Alkaline Phos 38 - 126 U/L 103 74 92  AST 15 - 41 U/L 36 26 31  ALT 0 - 44 U/L _1 Lab Results  Component Value Date   WBC 2.7 (L) 02/04/2020   HGB 11.7 (L) 02/04/2020   HCT 35.1 (L) 02/04/2020   MCV 93.1 02/04/2020   PLT 45 (L) 02/04/2020   NEUTROABS 1.4 (L) 02/04/2020    ASSESSMENT & PLAN:  Pancytopenia ([place]) Pancytopeniadue to alcohol abuse Leukopenia: Primarily neutropenia  Workup: 1. CBC with differential09/25/2018: WBC 2.7, ANC 1.5, hemoglobin 10, platelets 69 2. X-10 and folic acid levels: Normal 3. ANA: Negative 4. Flow cytometry: No monoclonalB orT-cell aberrant populations identified  5.Iron studies iron saturation 6%, TIBC 510, ferritin 95. (Given IV iron October 2018, Dec 2020)  Recommendation: Bone marrow biopsy on 09/26/2017: Slightly hypercellular(50-70%)bone marrow for age with trilineage hematopoiesis, mild plasmacytosis 4% The pancytopenia is directly attributable to her alcoholism and cirrhosis of the liver.  Lab review:WBC 2.7, hemoglobin 11.7, platelets 45, ANC 1.4 Hemoglobin improved because of IV iron therapy.  I once again reiterated the importance of alcohol cessation. She has cut down the alcohol use significantly according to her.  She only drinks 1 to 2 days a week instead of every day of the week.  Return to clinic in 1 year overall labs done ahead of time.   Orders Placed This Encounter  Procedures  . Ferritin     Standing Status:   Future    Standing Expiration Date:   02/03/2021  . Iron and TIBC    Standing Status:   Future    Standing Expiration Date:   02/03/2021  . CBC with Differential (Cancer Center Only)    Standing Status:   Future    Standing Expiration Date:   02/03/2021  . Vitamin B12    Standing Status:   Future    Standing Expiration Date:   02/03/2021  . Folate, Serum    Standing Status:   Future    Standing Expiration Date:   02/03/2021  . CMP ([facility] only)    Standing Status:   Future    Standing Expiration Date:   02/03/2021   The patient has a good understanding of the overall plan. she agrees with it. she will call with any problems that may develop before the next visit here.  Total time spent: 20 mins including face to face time and time spent for planning, charting and coordination of care  Nicholas Lose, MD 02/04/2020  I, Cloyde Reams Dorshimer, am acting as scribe for Dr. Nicholas Lose.  I have reviewed the above documentation for accuracy and completeness, and I agree with the above.

## 2020-02-04 ENCOUNTER — Other Ambulatory Visit: Payer: Self-pay

## 2020-02-04 ENCOUNTER — Inpatient Hospital Stay (HOSPITAL_BASED_OUTPATIENT_CLINIC_OR_DEPARTMENT_OTHER): Payer: Self-pay | Admitting: Hematology and Oncology

## 2020-02-04 ENCOUNTER — Other Ambulatory Visit: Payer: Self-pay | Admitting: *Deleted

## 2020-02-04 ENCOUNTER — Inpatient Hospital Stay: Payer: Self-pay | Attending: Hematology and Oncology

## 2020-02-04 DIAGNOSIS — Z79899 Other long term (current) drug therapy: Secondary | ICD-10-CM | POA: Insufficient documentation

## 2020-02-04 DIAGNOSIS — D509 Iron deficiency anemia, unspecified: Secondary | ICD-10-CM | POA: Insufficient documentation

## 2020-02-04 DIAGNOSIS — D500 Iron deficiency anemia secondary to blood loss (chronic): Secondary | ICD-10-CM

## 2020-02-04 DIAGNOSIS — D61818 Other pancytopenia: Secondary | ICD-10-CM | POA: Insufficient documentation

## 2020-02-04 DIAGNOSIS — Z886 Allergy status to analgesic agent status: Secondary | ICD-10-CM | POA: Insufficient documentation

## 2020-02-04 DIAGNOSIS — K7030 Alcoholic cirrhosis of liver without ascites: Secondary | ICD-10-CM | POA: Insufficient documentation

## 2020-02-04 DIAGNOSIS — Z885 Allergy status to narcotic agent status: Secondary | ICD-10-CM | POA: Insufficient documentation

## 2020-02-04 DIAGNOSIS — F1010 Alcohol abuse, uncomplicated: Secondary | ICD-10-CM | POA: Insufficient documentation

## 2020-02-04 LAB — CBC WITH DIFFERENTIAL (CANCER CENTER ONLY)
Abs Immature Granulocytes: 0.01 10*3/uL (ref 0.00–0.07)
Basophils Absolute: 0 10*3/uL (ref 0.0–0.1)
Basophils Relative: 0 %
Eosinophils Absolute: 0 10*3/uL (ref 0.0–0.5)
Eosinophils Relative: 1 %
HCT: 35.1 % — ABNORMAL LOW (ref 36.0–46.0)
Hemoglobin: 11.7 g/dL — ABNORMAL LOW (ref 12.0–15.0)
Immature Granulocytes: 0 %
Lymphocytes Relative: 37 %
Lymphs Abs: 1 10*3/uL (ref 0.7–4.0)
MCH: 31 pg (ref 26.0–34.0)
MCHC: 33.3 g/dL (ref 30.0–36.0)
MCV: 93.1 fL (ref 80.0–100.0)
Monocytes Absolute: 0.2 10*3/uL (ref 0.1–1.0)
Monocytes Relative: 9 %
Neutro Abs: 1.4 10*3/uL — ABNORMAL LOW (ref 1.7–7.7)
Neutrophils Relative %: 53 %
Platelet Count: 45 10*3/uL — ABNORMAL LOW (ref 150–400)
RBC: 3.77 MIL/uL — ABNORMAL LOW (ref 3.87–5.11)
RDW: 17 % — ABNORMAL HIGH (ref 11.5–15.5)
WBC Count: 2.7 10*3/uL — ABNORMAL LOW (ref 4.0–10.5)
nRBC: 0 % (ref 0.0–0.2)

## 2020-02-04 LAB — IRON AND TIBC
Iron: 78 ug/dL (ref 41–142)
Saturation Ratios: 17 % — ABNORMAL LOW (ref 21–57)
TIBC: 464 ug/dL — ABNORMAL HIGH (ref 236–444)
UIBC: 386 ug/dL — ABNORMAL HIGH (ref 120–384)

## 2020-02-04 LAB — FERRITIN: Ferritin: 28 ng/mL (ref 11–307)

## 2020-02-04 NOTE — Assessment & Plan Note (Signed)
Pancytopeniadue to alcohol abuse Leukopenia: Primarily neutropenia  Workup: 1. CBC with differential09/25/2018: WBC 2.7, ANC 1.5, hemoglobin 10, platelets 69 2. N-88 and folic acid levels: Normal 3. ANA: Negative 4. Flow cytometry: No monoclonalB orT-cell aberrant populations identified 5.Iron studies iron saturation 6%, TIBC 510, ferritin 95. (Given IV iron October 2018)  Recommendation: Bone marrow biopsy on 09/26/2017: Slightly hypercellular(50-70%)bone marrow for age with trilineage hematopoiesis, mild plasmacytosis 4% The pancytopenia is directly attributable to her alcoholism and cirrhosis of the liver.  Lab review:  I reiterated the importance of alcohol cessation. However the patient has still not quit drinking.  Cloudiness in the head and fatigue: Related to her other medications and her comorbidities.

## 2020-02-07 ENCOUNTER — Telehealth: Payer: Self-pay | Admitting: Hematology and Oncology

## 2020-02-07 NOTE — Telephone Encounter (Signed)
Scheduled per 03/16 los, patient has been called and notified of upcoming appointments.

## 2020-03-08 ENCOUNTER — Encounter (HOSPITAL_COMMUNITY): Payer: Self-pay | Admitting: Emergency Medicine

## 2020-03-08 ENCOUNTER — Other Ambulatory Visit: Payer: Self-pay

## 2020-03-08 ENCOUNTER — Emergency Department (HOSPITAL_COMMUNITY)
Admission: EM | Admit: 2020-03-08 | Discharge: 2020-03-08 | Disposition: A | Discharge status: Home / Self Care | Payer: Medicaid Other | Attending: Emergency Medicine | Admitting: Emergency Medicine

## 2020-03-08 DIAGNOSIS — Z87891 Personal history of nicotine dependence: Secondary | ICD-10-CM | POA: Insufficient documentation

## 2020-03-08 DIAGNOSIS — Z79899 Other long term (current) drug therapy: Secondary | ICD-10-CM | POA: Insufficient documentation

## 2020-03-08 DIAGNOSIS — J0100 Acute maxillary sinusitis, unspecified: Secondary | ICD-10-CM

## 2020-03-08 MED ORDER — AMOXICILLIN-POT CLAVULANATE 875-125 MG PO TABS: 1.0000 | ORAL_TABLET | Freq: Two times a day (BID) | ORAL | 0 refills | Status: AC

## 2020-03-08 MED ORDER — AMOXICILLIN-POT CLAVULANATE 875-125 MG PO TABS
1.0000 | ORAL_TABLET | Freq: Once | ORAL | Status: AC
  Administered 2020-03-08: 1 via ORAL
  Filled 2020-03-08 (×2): qty 1

## 2020-03-08 NOTE — Discharge Instructions (Signed)
You have been diagnosed today with Acute Non-Recurrent Maxillary Sinusitis.  At this time there does not appear to be the presence of an emergent medical condition, however there is always the potential for conditions to change. Please read and follow the below instructions.  Please return to the Emergency Department immediately for any new or worsening symptoms or if your symptoms do not improve within 3 days. Please be sure to follow up with your Primary Care Provider within one week regarding your visit today; please call their office to schedule an appointment even if you are feeling better for a follow-up visit. You may take the medication Augmentin as prescribed for the next 7 days.  Please drink plenty of water to avoid dehydration and get plenty of rest.  Please continue using the nasal saline rinses to help clear out the mucus. You may follow-up with the ear nose and throat specialist Dr. Redmond Baseman on your discharge paperwork as needed.  Get help right away if: You have a very bad headache. You cannot stop throwing up (vomiting). You have very bad pain or swelling around your face or eyes. You have trouble seeing. You feel confused. Your neck is stiff. You have trouble breathing. You have any new/concerning or worsening of symptoms  Please read the additional information packets attached to your discharge summary.  Do not take your medicine if  develop an itchy rash, swelling in your mouth or lips, or difficulty breathing; call 911 and seek immediate emergency medical attention if this occurs.  Note: Portions of this text may have been transcribed using voice recognition software. Every effort was made to ensure accuracy; however, inadvertent computerized transcription errors may still be present.

## 2020-03-08 NOTE — ED Triage Notes (Signed)
Per pt, states possible sinus infection-states head congestion, sinus pressure-also complaining of sore throat-symptoms started 5 days ago
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
## 2020-03-23 ENCOUNTER — Emergency Department (HOSPITAL_COMMUNITY)
Admission: EM | Admit: 2020-03-23 | Discharge: 2020-03-23 | Disposition: A | Discharge status: Home / Self Care | Payer: Medicaid Other | Attending: Emergency Medicine | Admitting: Emergency Medicine

## 2020-03-23 ENCOUNTER — Other Ambulatory Visit: Payer: Self-pay

## 2020-03-23 ENCOUNTER — Encounter (HOSPITAL_COMMUNITY): Payer: Self-pay | Admitting: *Deleted

## 2020-03-23 DIAGNOSIS — J0100 Acute maxillary sinusitis, unspecified: Secondary | ICD-10-CM

## 2020-03-23 DIAGNOSIS — F1010 Alcohol abuse, uncomplicated: Secondary | ICD-10-CM

## 2020-03-23 MED ORDER — LORAZEPAM 1 MG PO TABS
1.0000 mg | ORAL_TABLET | Freq: Once | ORAL | Status: AC
  Administered 2020-03-23: 1 mg via ORAL
  Filled 2020-03-23: qty 1

## 2020-03-23 MED ORDER — CHLORDIAZEPOXIDE HCL 25 MG PO CAPS
ORAL_CAPSULE | ORAL | 0 refills | Status: DC
Start: 2020-03-23 — End: 2020-06-27

## 2020-03-23 MED ORDER — AZITHROMYCIN 250 MG PO TABS: 250.0000 mg | ORAL_TABLET | Freq: Every day | ORAL | 0 refills | Status: DC

## 2020-03-23 MED ORDER — PREDNISONE 20 MG PO TABS
40.0000 mg | ORAL_TABLET | Freq: Once | ORAL | Status: AC
  Administered 2020-03-23: 40 mg via ORAL
  Filled 2020-03-23: qty 2

## 2020-03-23 MED ORDER — GUAIFENESIN 200 MG PO TABS
200.0000 mg | ORAL_TABLET | ORAL | 0 refills | Status: DC | PRN
Start: 2020-03-23 — End: 2020-06-27

## 2020-03-23 MED ORDER — PREDNISONE 20 MG PO TABS: 40.0000 mg | ORAL_TABLET | Freq: Every day | ORAL | 0 refills | Status: DC

## 2020-03-23 NOTE — ED Triage Notes (Signed)
Pt says right sided sinus congestion/headache and post nasal drip that has been ongoing, seen for the same about 2.5 weeks ago, says she took all of her antibiotics and not better. Pt also says that she "needs alcohol detox" has been drinking 2 bottles of wine a day.
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
## 2020-05-06 IMAGING — DX PORTABLE CHEST - 1 VIEW
1 series · 1 of 1 positions shown · non-contrast
Comparison: 03/10/2019

CLINICAL DATA: 42-year-old female with fever

EXAM:
PORTABLE CHEST 1 VIEW

[chest ap]
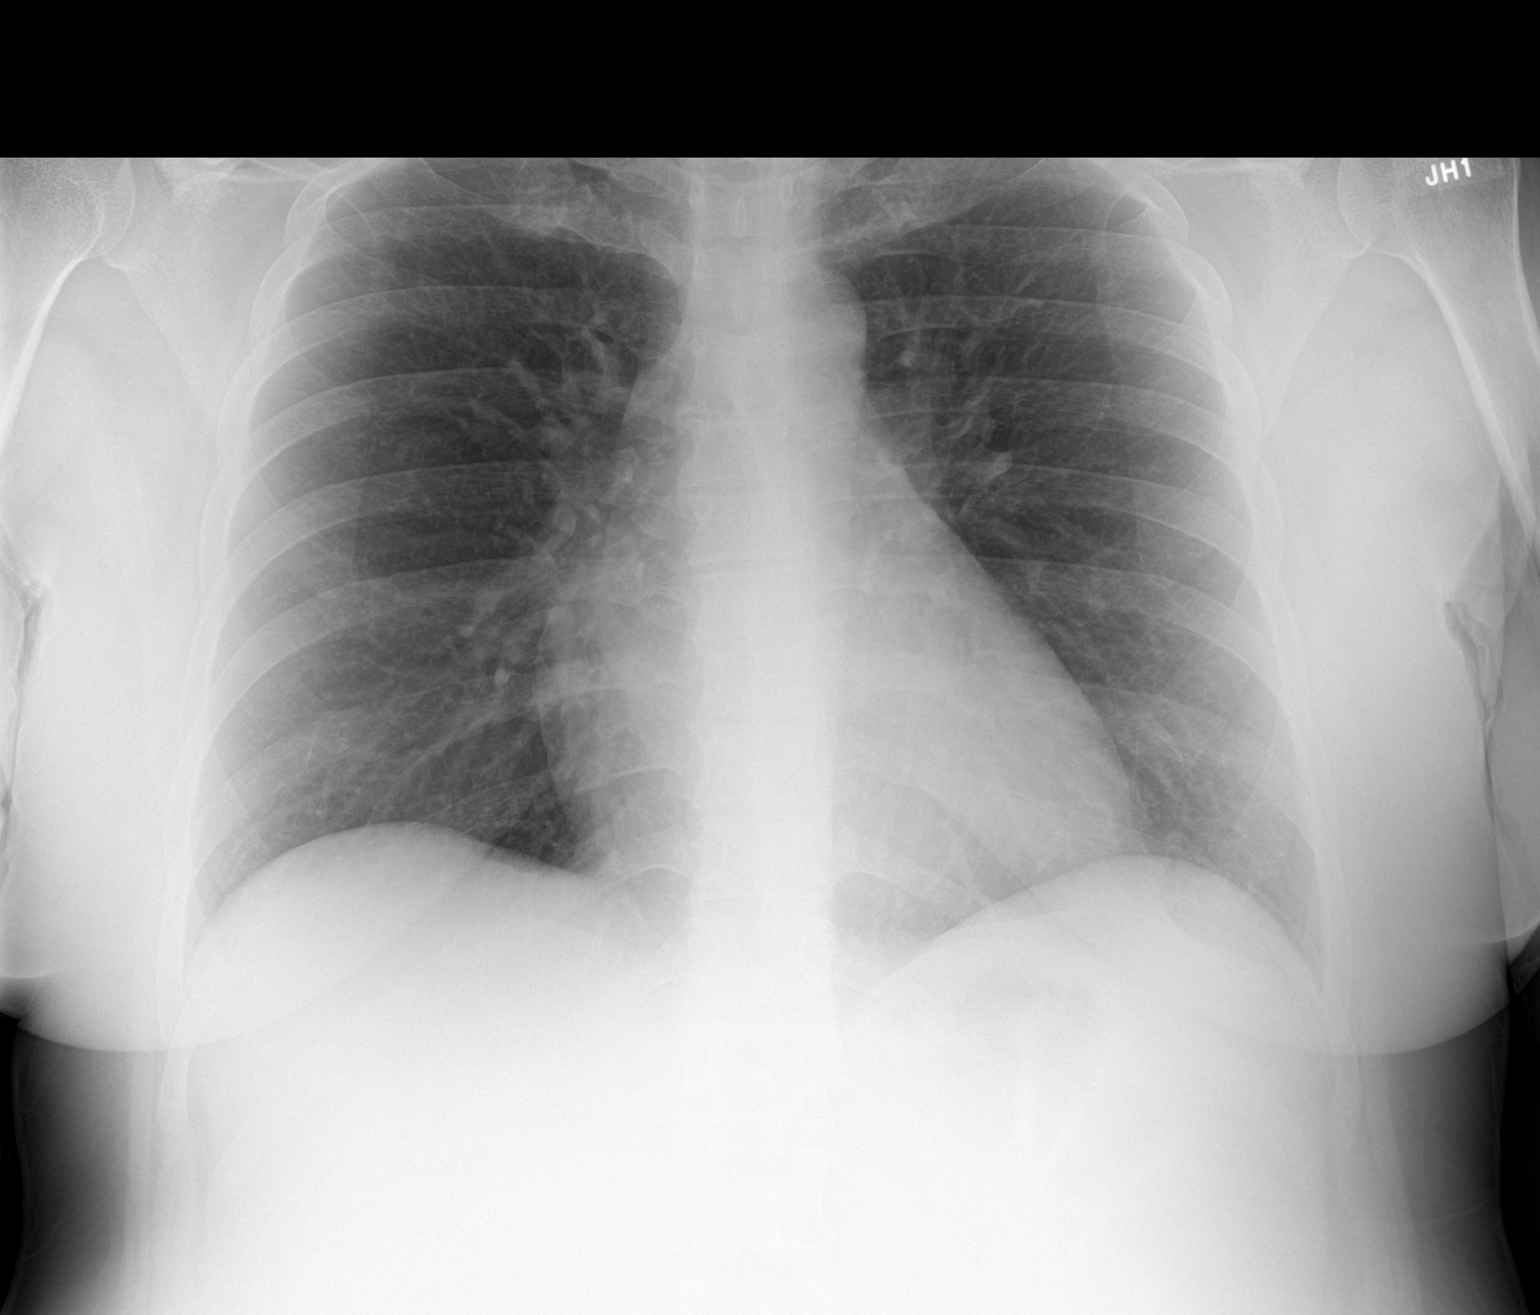

[1 of 1 positions shown; findings below may reference images not displayed]

FINDINGS: The heart size and mediastinal contours are within normal limits.
Both lungs are clear. The visualized skeletal structures are
unremarkable.
IMPRESSION: Negative for acute cardiopulmonary disease

## 2020-05-20 IMAGING — DX PORTABLE CHEST - 1 VIEW
1 series · 1 of 1 positions shown · non-contrast
Comparison: Chest x-ray dated March 18, 2019.

CLINICAL DATA: Fall.

EXAM:
PORTABLE CHEST 1 VIEW

[chest]
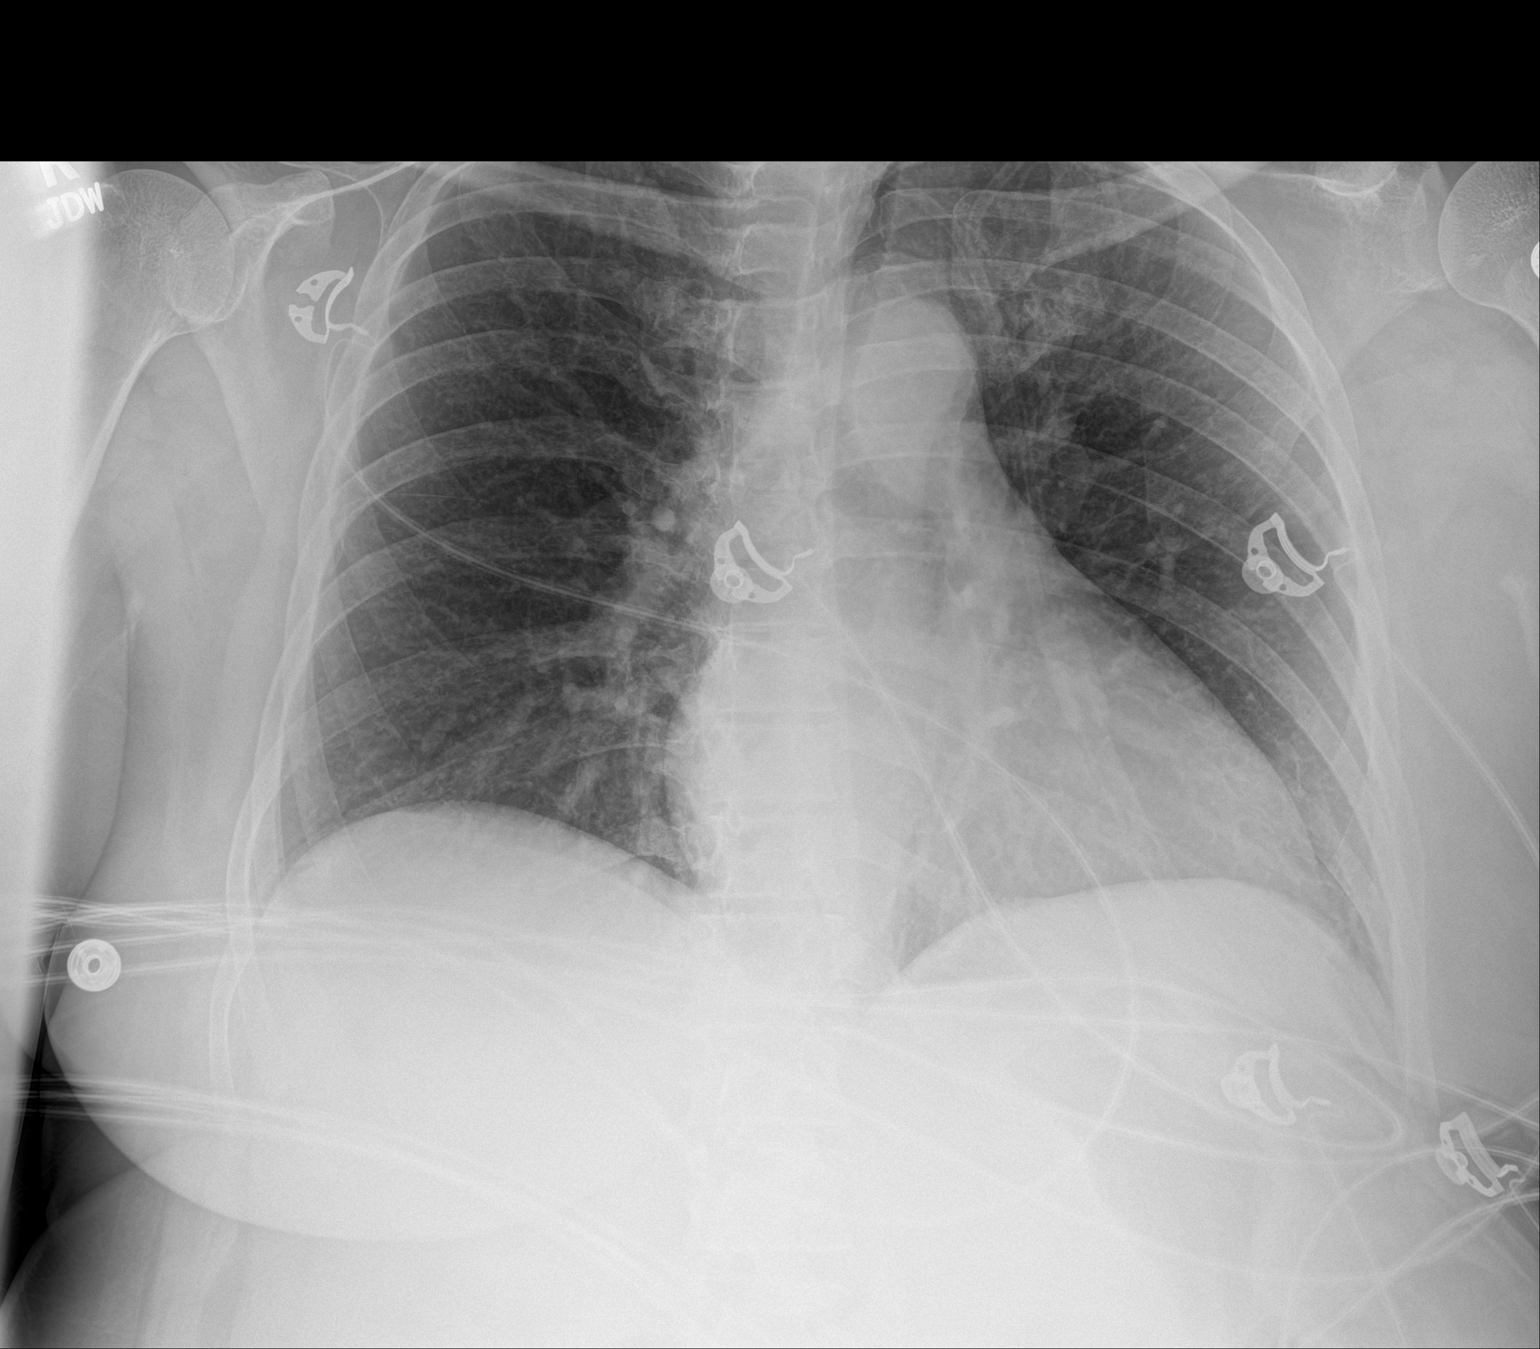

[1 of 1 positions shown; findings below may reference images not displayed]

FINDINGS: The patient is rotated to the left. The heart size and mediastinal
contours are within normal limits. Both lungs are clear. The
visualized skeletal structures are unremarkable.
IMPRESSION: No active disease.

## 2020-05-20 IMAGING — DX PORTABLE PELVIS 1-2 VIEWS
1 series · 2 of 2 positions shown · non-contrast
Comparison: CT abdomen and pelvis 08/30/2018.

CLINICAL DATA: Status post fall down 16 stairs today. Pain. Initial
encounter.

EXAM:
PORTABLE PELVIS 1-2 VIEWS

[Series 1: pelvis · 0.14mm/px · 2 of 2 slices shown]
[im 1/2]
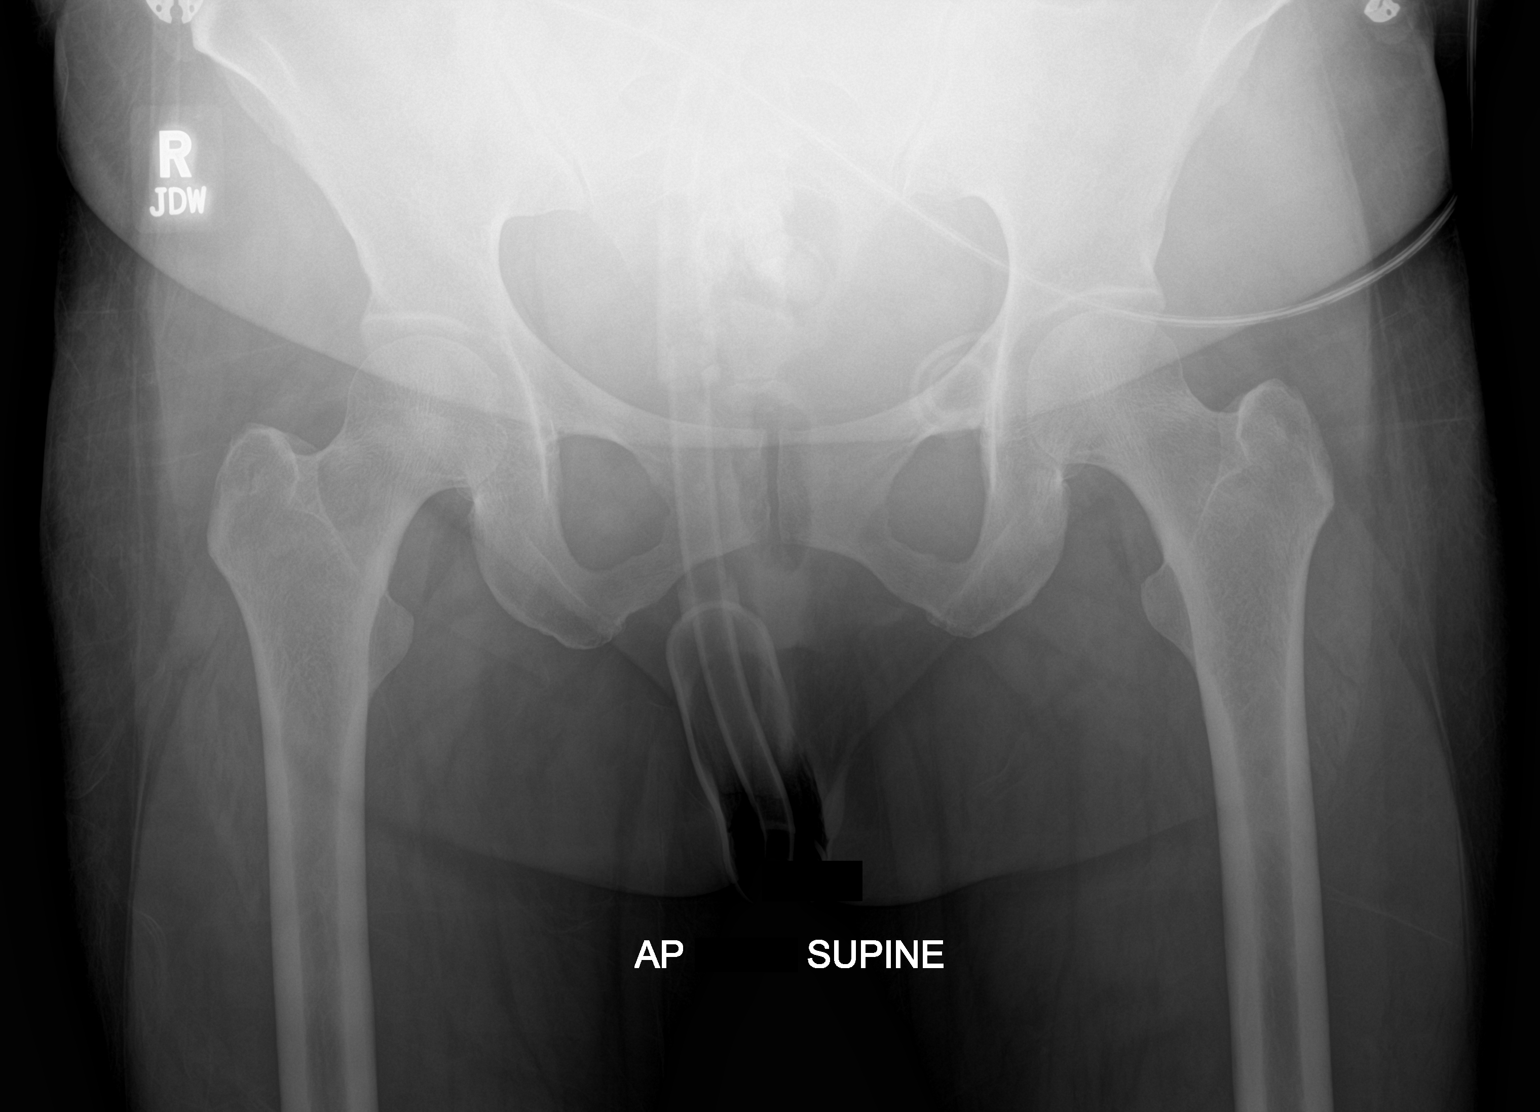
[im 2/2]
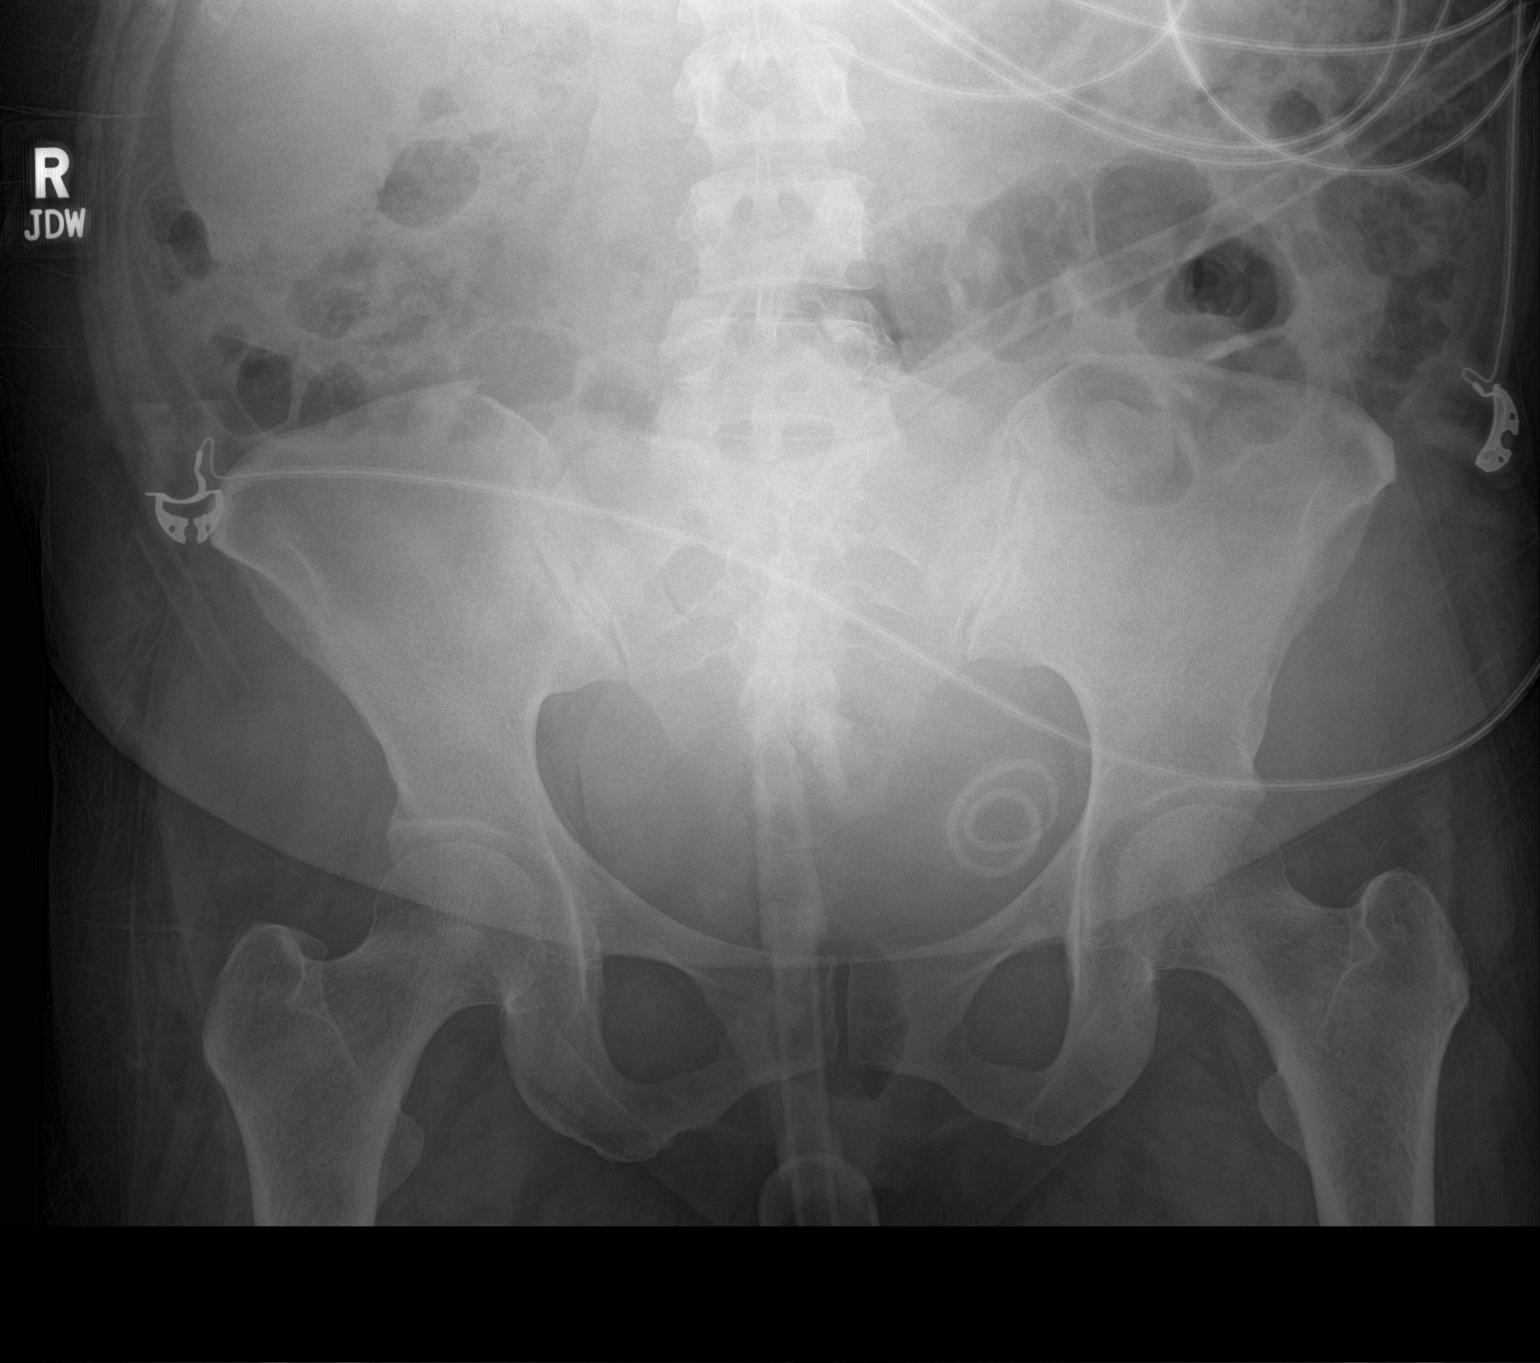

[2 of 2 positions shown; findings below may reference images not displayed]

FINDINGS: There is no evidence of pelvic fracture or diastasis. No pelvic bone
lesions are seen.
IMPRESSION: Normal exam.

## 2020-06-12 ENCOUNTER — Other Ambulatory Visit: Payer: Self-pay | Admitting: Obstetrics and Gynecology

## 2020-06-12 DIAGNOSIS — Z1231 Encounter for screening mammogram for malignant neoplasm of breast: Secondary | ICD-10-CM

## 2020-06-23 ENCOUNTER — Encounter (HOSPITAL_COMMUNITY): Payer: Self-pay

## 2020-06-23 ENCOUNTER — Inpatient Hospital Stay (HOSPITAL_COMMUNITY)
Admission: EM | Admit: 2020-06-23 | Discharge: 2020-06-27 | DRG: 896 | Disposition: A | Discharge status: Home / Self Care | Payer: Self-pay | Attending: Internal Medicine | Admitting: Internal Medicine

## 2020-06-23 ENCOUNTER — Emergency Department (HOSPITAL_COMMUNITY): Payer: Self-pay

## 2020-06-23 DIAGNOSIS — F10230 Alcohol dependence with withdrawal, uncomplicated: Secondary | ICD-10-CM

## 2020-06-23 DIAGNOSIS — F319 Bipolar disorder, unspecified: Secondary | ICD-10-CM | POA: Diagnosis present

## 2020-06-23 DIAGNOSIS — R011 Cardiac murmur, unspecified: Secondary | ICD-10-CM | POA: Diagnosis present

## 2020-06-23 DIAGNOSIS — Z87891 Personal history of nicotine dependence: Secondary | ICD-10-CM

## 2020-06-23 DIAGNOSIS — M25552 Pain in left hip: Secondary | ICD-10-CM | POA: Diagnosis present

## 2020-06-23 DIAGNOSIS — I959 Hypotension, unspecified: Secondary | ICD-10-CM

## 2020-06-23 DIAGNOSIS — Z20822 Contact with and (suspected) exposure to covid-19: Secondary | ICD-10-CM | POA: Diagnosis present

## 2020-06-23 DIAGNOSIS — J189 Pneumonia, unspecified organism: Secondary | ICD-10-CM

## 2020-06-23 DIAGNOSIS — Y908 Blood alcohol level of 240 mg/100 ml or more: Secondary | ICD-10-CM | POA: Diagnosis present

## 2020-06-23 DIAGNOSIS — M25562 Pain in left knee: Secondary | ICD-10-CM | POA: Diagnosis present

## 2020-06-23 DIAGNOSIS — Y92009 Unspecified place in unspecified non-institutional (private) residence as the place of occurrence of the external cause: Secondary | ICD-10-CM

## 2020-06-23 DIAGNOSIS — F1020 Alcohol dependence, uncomplicated: Secondary | ICD-10-CM | POA: Diagnosis present

## 2020-06-23 DIAGNOSIS — Z886 Allergy status to analgesic agent status: Secondary | ICD-10-CM

## 2020-06-23 DIAGNOSIS — W109XXA Fall (on) (from) unspecified stairs and steps, initial encounter: Secondary | ICD-10-CM | POA: Diagnosis present

## 2020-06-23 DIAGNOSIS — Z9049 Acquired absence of other specified parts of digestive tract: Secondary | ICD-10-CM

## 2020-06-23 DIAGNOSIS — D649 Anemia, unspecified: Secondary | ICD-10-CM | POA: Diagnosis present

## 2020-06-23 DIAGNOSIS — M25522 Pain in left elbow: Secondary | ICD-10-CM | POA: Diagnosis present

## 2020-06-23 DIAGNOSIS — D61818 Other pancytopenia: Secondary | ICD-10-CM | POA: Diagnosis present

## 2020-06-23 DIAGNOSIS — Z811 Family history of alcohol abuse and dependence: Secondary | ICD-10-CM

## 2020-06-23 DIAGNOSIS — F10939 Alcohol use, unspecified with withdrawal, unspecified: Secondary | ICD-10-CM | POA: Diagnosis present

## 2020-06-23 DIAGNOSIS — G312 Degeneration of nervous system due to alcohol: Secondary | ICD-10-CM | POA: Diagnosis present

## 2020-06-23 DIAGNOSIS — D696 Thrombocytopenia, unspecified: Secondary | ICD-10-CM | POA: Diagnosis present

## 2020-06-23 DIAGNOSIS — F32A Depression, unspecified: Secondary | ICD-10-CM | POA: Diagnosis present

## 2020-06-23 DIAGNOSIS — E876 Hypokalemia: Secondary | ICD-10-CM | POA: Diagnosis present

## 2020-06-23 DIAGNOSIS — Z8249 Family history of ischemic heart disease and other diseases of the circulatory system: Secondary | ICD-10-CM

## 2020-06-23 DIAGNOSIS — K766 Portal hypertension: Secondary | ICD-10-CM | POA: Diagnosis present

## 2020-06-23 DIAGNOSIS — N920 Excessive and frequent menstruation with regular cycle: Secondary | ICD-10-CM | POA: Diagnosis present

## 2020-06-23 DIAGNOSIS — F411 Generalized anxiety disorder: Secondary | ICD-10-CM | POA: Diagnosis present

## 2020-06-23 DIAGNOSIS — N3289 Other specified disorders of bladder: Secondary | ICD-10-CM | POA: Diagnosis present

## 2020-06-23 DIAGNOSIS — J690 Pneumonitis due to inhalation of food and vomit: Secondary | ICD-10-CM | POA: Diagnosis present

## 2020-06-23 DIAGNOSIS — Z79899 Other long term (current) drug therapy: Secondary | ICD-10-CM

## 2020-06-23 DIAGNOSIS — W19XXXA Unspecified fall, initial encounter: Secondary | ICD-10-CM

## 2020-06-23 DIAGNOSIS — F10239 Alcohol dependence with withdrawal, unspecified: Principal | ICD-10-CM | POA: Diagnosis present

## 2020-06-23 DIAGNOSIS — F419 Anxiety disorder, unspecified: Secondary | ICD-10-CM | POA: Diagnosis present

## 2020-06-23 DIAGNOSIS — K219 Gastro-esophageal reflux disease without esophagitis: Secondary | ICD-10-CM | POA: Diagnosis present

## 2020-06-23 DIAGNOSIS — Y9301 Activity, walking, marching and hiking: Secondary | ICD-10-CM | POA: Diagnosis present

## 2020-06-23 DIAGNOSIS — Z885 Allergy status to narcotic agent status: Secondary | ICD-10-CM

## 2020-06-23 DIAGNOSIS — F4310 Post-traumatic stress disorder, unspecified: Secondary | ICD-10-CM | POA: Diagnosis present

## 2020-06-23 DIAGNOSIS — K7030 Alcoholic cirrhosis of liver without ascites: Secondary | ICD-10-CM | POA: Diagnosis present

## 2020-06-23 LAB — ETHANOL: Alcohol, Ethyl (B): 413 mg/dL (ref <10)

## 2020-06-23 LAB — CBC
HCT: 39 % (ref 36.0–46.0)
Hemoglobin: 12.4 g/dL (ref 12.0–15.0)
MCH: 30.1 pg (ref 26.0–34.0)
MCHC: 31.8 g/dL (ref 30.0–36.0)
MCV: 94.7 fL (ref 80.0–100.0)
Platelets: 99 10*3/uL — ABNORMAL LOW (ref 150–400)
RBC: 4.12 MIL/uL (ref 3.87–5.11)
RDW: 15.8 % — ABNORMAL HIGH (ref 11.5–15.5)
WBC: 3.9 10*3/uL — ABNORMAL LOW (ref 4.0–10.5)
nRBC: 0 % (ref 0.0–0.2)

## 2020-06-23 LAB — CBG MONITORING, ED
Glucose-Capillary: 90 mg/dL (ref 70–99)
Glucose-Capillary: 90 mg/dL (ref 70–99)
Glucose-Capillary: 86 mg/dL (ref 70–99)

## 2020-06-23 LAB — COMPREHENSIVE METABOLIC PANEL
ALT: 23 U/L (ref 0–44)
AST: 56 U/L — ABNORMAL HIGH (ref 15–41)
Albumin: 3.4 g/dL — ABNORMAL LOW (ref 3.5–5.0)
Alkaline Phosphatase: 69 U/L (ref 38–126)
Anion gap: 9 (ref 5–15)
BUN: <5 mg/dL — ABNORMAL LOW (ref 6–20)
CO2: 24 mmol/L (ref 22–32)
Calcium: 8.3 mg/dL — ABNORMAL LOW (ref 8.9–10.3)
Chloride: 110 mmol/L (ref 98–111)
Creatinine, Ser: 0.49 mg/dL (ref 0.44–1.00)
GFR calc Af Amer: >60 mL/min (ref >60)
GFR calc non Af Amer: >60 mL/min (ref >60)
Glucose, Bld: 88 mg/dL (ref 70–99)
Potassium: 3.4 mmol/L — ABNORMAL LOW (ref 3.5–5.1)
Sodium: 143 mmol/L (ref 135–145)
Total Bilirubin: 1 mg/dL (ref 0.3–1.2)
Total Protein: 7.3 g/dL (ref 6.5–8.1)

## 2020-06-23 LAB — URINALYSIS, ROUTINE W REFLEX MICROSCOPIC
Bilirubin Urine: NEGATIVE
Glucose, UA: NEGATIVE mg/dL
Hgb urine dipstick: NEGATIVE
Ketones, ur: NEGATIVE mg/dL
Leukocytes,Ua: NEGATIVE
Nitrite: NEGATIVE
Protein, ur: NEGATIVE mg/dL
Specific Gravity, Urine: 1.002 — ABNORMAL LOW (ref 1.005–1.030)
pH: 6 (ref 5.0–8.0)

## 2020-06-23 LAB — RAPID URINE DRUG SCREEN, HOSP PERFORMED
Amphetamines: NOT DETECTED
Barbiturates: NOT DETECTED
Benzodiazepines: NOT DETECTED
Cocaine: NOT DETECTED
Opiates: NOT DETECTED
Tetrahydrocannabinol: NOT DETECTED

## 2020-06-23 LAB — PROTIME-INR
INR: 1.4 — ABNORMAL HIGH (ref 0.8–1.2)
INR: 1.4 — ABNORMAL HIGH (ref 0.8–1.2)
Prothrombin Time: 16.3 seconds — ABNORMAL HIGH (ref 11.4–15.2)
Prothrombin Time: 16.2 seconds — ABNORMAL HIGH (ref 11.4–15.2)

## 2020-06-23 LAB — PREGNANCY, URINE: Preg Test, Ur: NEGATIVE

## 2020-06-23 LAB — SARS CORONAVIRUS 2 BY RT PCR (HOSPITAL ORDER, PERFORMED IN ~~LOC~~ HOSPITAL LAB): SARS Coronavirus 2: NEGATIVE

## 2020-06-23 LAB — HIV ANTIBODY (ROUTINE TESTING W REFLEX): HIV Screen 4th Generation wRfx: NONREACTIVE

## 2020-06-23 LAB — LACTIC ACID, PLASMA: Lactic Acid, Venous: 1.9 mmol/L (ref 0.5–1.9)

## 2020-06-23 LAB — SAMPLE TO BLOOD BANK

## 2020-06-23 LAB — AMMONIA: Ammonia: 61 umol/L — ABNORMAL HIGH (ref 9–35)

## 2020-06-23 LAB — APTT: aPTT: 37 seconds — ABNORMAL HIGH (ref 24–36)

## 2020-06-23 MED ORDER — SODIUM CHLORIDE 0.9 % IV BOLUS
1000.0000 mL | Freq: Once | INTRAVENOUS | Status: AC
  Administered 2020-06-23: 1000 mL via INTRAVENOUS

## 2020-06-23 MED ORDER — THIAMINE HCL 100 MG PO TABS
100.0000 mg | ORAL_TABLET | Freq: Every day | ORAL | Status: DC
  Administered 2020-06-23 – 2020-06-27 (×5): 100 mg via ORAL
  Filled 2020-06-23 (×5): qty 1

## 2020-06-23 MED ORDER — LORAZEPAM 1 MG PO TABS
0.0000 mg | ORAL_TABLET | Freq: Two times a day (BID) | ORAL | Status: DC
  Administered 2020-06-25 – 2020-06-26 (×2): 1 mg via ORAL
  Filled 2020-06-23 (×2): qty 1

## 2020-06-23 MED ORDER — ACETAMINOPHEN 325 MG PO TABS
650.0000 mg | ORAL_TABLET | Freq: Once | ORAL | Status: AC
  Administered 2020-06-23: 650 mg via ORAL
  Filled 2020-06-23: qty 2

## 2020-06-23 MED ORDER — LORAZEPAM 2 MG/ML IJ SOLN
1.0000 mg | INTRAMUSCULAR | Status: AC | PRN
  Administered 2020-06-25: 1 mg via INTRAVENOUS
  Filled 2020-06-23 (×2): qty 1

## 2020-06-23 MED ORDER — THIAMINE HCL 100 MG/ML IJ SOLN
INTRAVENOUS | Status: AC
  Filled 2020-06-23 (×4): qty 1000

## 2020-06-23 MED ORDER — LORAZEPAM 1 MG PO TABS
1.0000 mg | ORAL_TABLET | ORAL | Status: AC | PRN
  Administered 2020-06-25 – 2020-06-26 (×2): 1 mg via ORAL
  Filled 2020-06-23: qty 1

## 2020-06-23 MED ORDER — THIAMINE HCL 100 MG/ML IJ SOLN
100.0000 mg | Freq: Every day | INTRAMUSCULAR | Status: DC
  Filled 2020-06-23: qty 2

## 2020-06-23 MED ORDER — ESCITALOPRAM OXALATE 20 MG PO TABS
20.0000 mg | ORAL_TABLET | Freq: Every day | ORAL | Status: DC
  Administered 2020-06-23 – 2020-06-27 (×5): 20 mg via ORAL
  Filled 2020-06-23: qty 2
  Filled 2020-06-23 (×3): qty 1
  Filled 2020-06-23: qty 2

## 2020-06-23 MED ORDER — CHLORDIAZEPOXIDE HCL 10 MG PO CAPS: 10.0000 mg | ORAL_CAPSULE | Freq: Three times a day (TID) | ORAL | Status: DC

## 2020-06-23 MED ORDER — SODIUM CHLORIDE 0.9 % IV SOLN
3.0000 g | Freq: Four times a day (QID) | INTRAVENOUS | Status: DC
  Administered 2020-06-23 – 2020-06-26 (×11): 3 g via INTRAVENOUS
  Filled 2020-06-23 (×3): qty 3
  Filled 2020-06-23: qty 8
  Filled 2020-06-23: qty 3
  Filled 2020-06-23 (×9): qty 8

## 2020-06-23 MED ORDER — TOPIRAMATE 25 MG PO TABS
50.0000 mg | ORAL_TABLET | Freq: Two times a day (BID) | ORAL | Status: DC
  Administered 2020-06-23 – 2020-06-27 (×8): 50 mg via ORAL
  Filled 2020-06-23 (×12): qty 2

## 2020-06-23 MED ORDER — LACTULOSE 10 GM/15ML PO SOLN
30.0000 g | Freq: Two times a day (BID) | ORAL | Status: DC | PRN
  Filled 2020-06-23: qty 45

## 2020-06-23 MED ORDER — IOHEXOL 350 MG/ML SOLN
100.0000 mL | Freq: Once | INTRAVENOUS | Status: AC | PRN
  Administered 2020-06-23: 100 mL via INTRAVENOUS

## 2020-06-23 MED ORDER — LORAZEPAM 1 MG PO TABS
0.0000 mg | ORAL_TABLET | Freq: Four times a day (QID) | ORAL | Status: AC
  Administered 2020-06-23 – 2020-06-24 (×3): 1 mg via ORAL
  Filled 2020-06-23 (×2): qty 1
  Filled 2020-06-23: qty 4
  Filled 2020-06-23: qty 1

## 2020-06-23 NOTE — ED Provider Notes (Signed)
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 06/23/20 0931)  sodium chloride 0.9 % bolus 1,000 mL (0 mLs Intravenous Stopped 06/23/20 1251)  sodium chloride 0.9 % bolus 1,000 mL (0 mLs Intravenous Stopped 06/23/20 1110)  sodium chloride 0.9 % bolus 1,000 mL (0 mLs Intravenous Stopped 06/23/20 1251)  iohexol (OMNIPAQUE) 350 MG/ML injection 100 mL (100 mLs Intravenous Contrast Given 06/23/20 1230)  sodium chloride 0.9 % bolus 1,000 mL (1,000 mLs Intravenous New Bag/Given 06/23/20 1258)    ED Course  I have reviewed the triage vital signs and the nursing notes.  Pertinent labs & imaging results that were available during my care of the patient were reviewed by me and considered in my medical decision making (see chart for details).    MDM Rules/Calculators/A&P                          44 y/o F presenting for eval after a fall down 10 stairs. H/o ETOH use. +head trauma and LOC.   Pt noted to be hypotensive with EMS and initially upon ED presentation. She does not  appear to have any significant injuries on initial exam. Discussed case with Dr. Sherry Ruffing, supervising physician, who personally evalauted the patient. At the time of his evaluation patients BP  now 91 systolic. Recommends holding on making patient a level 1 trauma at this time.   Reviewed/interpreted labs CBC with leukopenia, thrombocytopenia, both of which are chronic.  No anemia CMP with mild hypokalemia, normal renal function, normal LFTs. Ammonia elevated at 61  ETOH significantly elevated >400 Lactic negative UDS neg UA neg Urine preg neg  Reivewed/interpreted all imaging and I agree with radiology CXR - neg for acute traumatic injury, pna, ptx, or other abnormality Pelvis xray - neg for acute traumatic injury CT head - No acute intracranial abnormality. Scattered calcifications, likely post inflammatory, stable when compared to prior imaging CT cervical spine - No acute bony abnormality in the cervical spine. Ground-glass opacities in the apices bilaterally. These are nonspecific and could be inflammatory/infectious.  - covid swab added, cxr clear Xray left shoulder/elbow/wrist - neg for acute traumatic injury Xray left knee/ankle/foot - neg for acute traumatic injury  Pt placed in ccollar. IVF were ordered for the patient.  9:52 AM CONSULT with Saverio Danker, PA-C with trauma services. She agrees that patient does not need to be made a level 1 trauma in light of her persistent hypotension. Does recommend CT abd/pelvis given patients intoxication.  11:03 PM Rechecked pt. She is sitting up in bed pulling off her cardiac monitoring and trying to take out her IV.  She states she wants to leave.  Discussed with patient that she is hypotensive and we would not recommend that she leave.  She is now agreeable to stay.  CT chest/abd pelvis -  1. No CT findings for pulmonary embolism. 2. Patchy peripheral ground-glass opacities, most notable in the lung apices. Findings could be due to an atypical/viral pneumonia. 3. No acute abdominal/pelvic findings, mass lesions or adenopathy. 4. 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 No ascites. 5. Status post cholecystectomy with stable common bile duct dilatation and probable dilated cystic duct remnant. 6. Moderate distention of the bladder with estimated volume of 700 CC. 7. Aortic atherosclerosis. Aortic Atherosclerosis   Have rechecked patient multiple times and she continues to have hypotension despite given 4L IVF bolus.  Her blood pressure has responded somewhat to IV fluids however I do think she is significantly dehydrated as her lips are dry and her mucous membranes are dry.  Her white blood cell count is normal, her lactic acid is negative.  She is not febrile and her hypotension seems unrelated to sepsis at this time.  I do feel she will need to be admitted for continued hydration and will likely need further treatment of her pneumonia which at this point is undifferentiated as we are still waiting for her Covid test. Pt was seen in conuunction with Dr. Sherry Ruffing, my supervising physician who has also evaluated the patient. He is in agreement with w/u and current plan. He does not feel the pressors are currently indicated.   1:41 PM CONSULT with Dr. Roosevelt Locks with hospitalist service who accepts patient for admission.  Final Clinical Impression(s) / ED Diagnoses Final diagnoses:  Fall  Atypical pneumonia  Hypotension, unspecified hypotension type    Rx / DC Orders ED Discharge Orders    None       Rodney Booze, PA-C 06/23/20 1346    Tegeler, Gwenyth Allegra, MD 06/24/20 1054

## 2020-06-23 NOTE — ED Notes (Signed)
Pt placed in C Collar .

## 2020-06-23 NOTE — ED Notes (Signed)
Back from imaging.

## 2020-06-23 NOTE — ED Notes (Signed)
Entered room to find pt standing next to the bed with all monitoring and IV disconnected. Pt stated she needs to go to the bathroom. Assisted pt to bathroom and returned to bed. Pt reconnected to monitoring and RN notified of disconnected IV.

## 2020-06-23 NOTE — ED Triage Notes (Signed)
Pt came in [facility] c/o of an unwitnessed Fall from home. Pt states that she fell about 10 steps. Left Arm and Leg Pain. A/Ox4. Pt is having repeative questioning. Pt denies ETOH and drug use. EMS reports she was hypotensive 70/34, 68HR, 96%RA. 140CBG.

## 2020-06-23 NOTE — ED Notes (Signed)
Off floor for both X-ray and CT

## 2020-06-23 NOTE — ED Notes (Signed)
Pt pulled out IV. Restarted two more IV's. Pt is alert and oriented x 3 but seems to have difficulty grasping her medical situation. She states that because she doesn't feel bad she is ok. Two times earlier today she attempted to leave but was convinced to stay.  Attending MD was also notifed about BP and rectal Temp. MD ordeerd fluids to be increased to 150 mL/hr

## 2020-06-23 NOTE — H&P (Signed)
History and Physical    Sara [name] ZOX:[id number] DOB: [birth year] DOA: 06/23/2020  PCP: Patient, No Pcp Per (Confirm with patient/family/NH records and if not entered, this has to be entered at [facility] point of entry) Patient coming from: Home  I have personally briefly reviewed patient's old medical records in [place]  Chief Complaint: I fell  HPI: Sara [name] is a 44 y.o. 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  This happened last night, and was unwitnessed.  Patient initially denied she was drunk before this happened, but later admitted she was drunk and fell stairs landed on the left side arms and legs.  She felt aching pain of the left elbow and left knee and hip.  She really admitted drinking a 12 ounce beer last night before she fell.  She also reported 2 times vomiting last night after the fall, with stomach content.  She developed some dry cough this morning but no short of breath.  But her alcohol level this morning was more than 400.  She denied any loss of consciousness, head injury, fever chills, chest pains.  She further denied any feeling of lightheadedness, blurry vision. ED Course: Alcohol 413, of left foot ankle knee and left elbow negative for fracture or dislocation.  CT scan/CT angiogram negative for PE but groundglass changes of apex of the lungs.  CT abdomen shows cirrhosis.  Review of Systems: As per HPI otherwise 10 point review of systems negative.    Past Medical History:  Diagnosis Date   Alcohol abuse    Alcoholism ([place])    Anemia    Anxiety    Blood transfusion without reported diagnosis    Cirrhosis ([place])    Depression    Esophageal varices with bleeding(456.0) 06/13/2014   GERD (gastroesophageal reflux disease)    Heart murmur    Patient states she may have   Menorrhagia    Pancytopenia ([place]) 01/15/2014    Pneumonia    Portal hypertension ([place])    S/P alcohol detoxification    2-3 days at behavioral health previously   UGI bleed 06/12/2014    Past Surgical History:  Procedure Laterality Date   CHOLECYSTECTOMY     ESOPHAGOGASTRODUODENOSCOPY N/A 06/12/2014   Procedure: ESOPHAGOGASTRODUODENOSCOPY (EGD);  Surgeon: Gatha Mayer, MD;  Location: [facility];  Service: Endoscopy;  Laterality: N/A;   ESOPHAGOGASTRODUODENOSCOPY (EGD) WITH PROPOFOL N/A 07/29/2014   Procedure: ESOPHAGOGASTRODUODENOSCOPY (EGD) WITH PROPOFOL;  Surgeon: Inda [place], MD;  Location: [facility];  Service: Endoscopy;  Laterality: N/A;   ESOPHAGOGASTRODUODENOSCOPY (EGD) WITH PROPOFOL N/A 01/20/2018   Procedure: ESOPHAGOGASTRODUODENOSCOPY (EGD) WITH PROPOFOL;  Surgeon: Mauri Pole, MD;  Location: [facility];  Service: Endoscopy;  Laterality: N/A;     reports that she quit smoking about 19 months ago. Her smoking use included cigarettes. She smoked 0.25 packs per day. She has never used smokeless tobacco. She reports current alcohol use. She reports that she does not use drugs.  Allergies  Allergen Reactions   Morphine And Related Other (See Comments)    Slowed HR, lowered BP   Nsaids Other (See Comments)    Caused internal bleeding    Family History  Problem Relation Age of Onset   Colon polyps Mother    Hypertension Mother    Thyroid disease Mother    Alcoholism Mother    Alcoholism Father    Alcohol abuse Maternal Grandfather    Alcohol  abuse Paternal Grandfather    Alcohol abuse Paternal Aunt    Alcohol abuse Maternal Uncle      Prior to Admission medications   Medication Sig Start Date End Date Taking? Authorizing Provider  escitalopram (LEXAPRO) 20 MG tablet Take 1 tablet (20 mg total) by mouth daily. 04/24/19  Yes Money, Lowry Ram, FNP  topiramate (TOPAMAX) 50 MG tablet Take 1 tablet (50 mg total) by mouth 2 (two) times daily. 04/23/19  Yes Money, Lowry Ram, FNP  azithromycin  (ZITHROMAX) 250 MG tablet Take 1 tablet (250 mg total) by mouth daily. Take first 2 tablets together, then 1 every day until finished. Patient not taking: Reported on 06/23/2020 03/23/20   Virgel Manifold, MD  chlordiazePOXIDE (LIBRIUM) 25 MG capsule 50mg  PO TID x 1D, then 25-50mg  PO BID X 1D, then 25-50mg  PO QD X 1D Patient not taking: Reported on 06/23/2020 03/23/20   Virgel Manifold, MD  guaiFENesin 200 MG tablet Take 1 tablet (200 mg total) by mouth every 4 (four) hours as needed for cough or to loosen phlegm. Helps loosen secretions. Patient not taking: Reported on 06/23/2020 03/23/20   Virgel Manifold, MD  lidocaine (LIDODERM) 5 % Place 1 patch onto the skin daily. Remove & Discard patch within 12 hours or as directed by MD Patient not taking: Reported on 03/23/2020 11/17/19   Antonietta Breach, PA-C  methocarbamol (ROBAXIN) 500 MG tablet Take 1 tablet (500 mg total) by mouth every 12 (twelve) hours as needed for muscle spasms. Patient not taking: Reported on 03/23/2020 11/17/19   Antonietta Breach, PA-C  predniSONE (DELTASONE) 20 MG tablet Take 2 tablets (40 mg total) by mouth daily. Patient not taking: Reported on 06/23/2020 03/23/20   Virgel Manifold, MD  QUEtiapine (SEROQUEL) 100 MG tablet Take 1 tablet (100 mg total) by mouth at bedtime. Patient not taking: Reported on 06/23/2020 04/23/19   Lewis Shock, FNP    Physical Exam: Vitals:   06/23/20 1453 06/23/20 1502 06/23/20 1545 06/23/20 1559  BP: (!) 82/55 (!) 80/58 (!) 87/70 (!) 87/70  Pulse:  63  63  Resp:  20 17   Temp:  (!) 96.8 F (36 C)    TempSrc:  Rectal    SpO2:  94%    Weight:      Height:        Constitutional: NAD, calm, comfortable Vitals:   06/23/20 1453 06/23/20 1502 06/23/20 1545 06/23/20 1559  BP: (!) 82/55 (!) 80/58 (!) 87/70 (!) 87/70  Pulse:  63  63  Resp:  20 17   Temp:  (!) 96.8 F (36 C)    TempSrc:  Rectal    SpO2:  94%    Weight:      Height:       Eyes: PERRL, lids and conjunctivae normal ENMT: Mucous membranes are dry.  Posterior pharynx clear of any exudate or lesions.Normal dentition.  Neck: normal, supple, no masses, no thyromegaly Respiratory: clear to auscultation bilaterally, no wheezing, no crackles. Normal respiratory effort. No accessory muscle use.  Cardiovascular: Regular rate and rhythm, no murmurs / rubs / gallops. No extremity edema. 2+ pedal pulses. No carotid bruits.  Abdomen: no tenderness, no masses palpated. No hepatosplenomegaly. Bowel sounds positive.  Musculoskeletal: no clubbing / cyanosis. No joint deformity upper and lower extremities. Good ROM, no contractures. Normal muscle tone.  Skin: Spider angiomas Neurologic: CN 2-12 grossly intact. Sensation intact, DTR normal. Strength 5/5 in all 4.  Psychiatric: Normal judgment and insight. Alert and oriented x 3.  Normal mood.  Tremors on bilateral fingertips   Labs on Admission: I have personally reviewed following labs and imaging studies  CBC: Recent Labs  Lab 06/23/20 0715  WBC 3.9*  HGB 12.4  HCT 39.0  MCV 94.7  PLT 99*   Basic Metabolic Panel: Recent Labs  Lab 06/23/20 0715  NA 143  K 3.4*  CL 110  CO2 24  GLUCOSE 88  BUN <5*  CREATININE 0.49  CALCIUM 8.3*   GFR: Estimated Creatinine Clearance: 87.9 mL/min (by C-G formula based on SCr of 0.49 mg/dL). Liver Function Tests: Recent Labs  Lab 06/23/20 0715  AST 56*  ALT 23  ALKPHOS 69  BILITOT 1.0  PROT 7.3  ALBUMIN 3.4*   No results for input(s): LIPASE, AMYLASE in the last 168 hours. Recent Labs  Lab 06/23/20 0716  AMMONIA 61*   Coagulation Profile: Recent Labs  Lab 06/23/20 1145  INR 1.4*   Cardiac Enzymes: No results for input(s): CKTOTAL, CKMB, CKMBINDEX, TROPONINI in the last 168 hours. BNP (last 3 results) No results for input(s): PROBNP in the last 8760 hours. HbA1C: No results for input(s): HGBA1C in the last 72 hours. CBG: Recent Labs  Lab 06/23/20 0643 06/23/20 1337  GLUCAP 90   90 86   Lipid Profile: No results for input(s):  CHOL, HDL, LDLCALC, TRIG, CHOLHDL, LDLDIRECT in the last 72 hours. Thyroid Function Tests: No results for input(s): TSH, T4TOTAL, FREET4, T3FREE, THYROIDAB in the last 72 hours. Anemia Panel: No results for input(s): VITAMINB12, FOLATE, FERRITIN, TIBC, IRON, RETICCTPCT in the last 72 hours. Urine analysis:    Component Value Date/Time   COLORURINE STRAW (A) 06/23/2020 0701   APPEARANCEUR CLEAR 06/23/2020 0701   LABSPEC 1.002 (L) 06/23/2020 0701   PHURINE 6.0 06/23/2020 0701   GLUCOSEU NEGATIVE 06/23/2020 0701   HGBUR NEGATIVE 06/23/2020 0701   BILIRUBINUR NEGATIVE 06/23/2020 0701   BILIRUBINUR Neg 05/08/2015 0919   KETONESUR NEGATIVE 06/23/2020 0701   PROTEINUR NEGATIVE 06/23/2020 0701   UROBILINOGEN 0.2 07/28/2015 1711   NITRITE NEGATIVE 06/23/2020 0701   LEUKOCYTESUR NEGATIVE 06/23/2020 0701    Radiological Exams on Admission: DG Elbow Complete Left  Result Date: 06/23/2020 CLINICAL DATA:  Un witnessed fall with elbow pain, initial encounter EXAM: LEFT ELBOW - COMPLETE 3+ VIEW COMPARISON:  None. FINDINGS: There is no evidence of fracture, dislocation, or joint effusion. There is no evidence of arthropathy or other focal bone abnormality. Soft tissues are unremarkable. IMPRESSION: No acute abnormality noted Electronically Signed   By: Inez Catalina M.D.   On: 06/23/2020 08:49   DG Wrist Complete Left  Result Date: 06/23/2020 CLINICAL DATA:  Recent fall downstairs with wrist pain, initial encounter EXAM: LEFT WRIST - COMPLETE 3+ VIEW COMPARISON:  None. FINDINGS: There is no evidence of fracture or dislocation. There is no evidence of arthropathy or other focal bone abnormality. Soft tissues are unremarkable. IMPRESSION: No acute abnormality noted. Electronically Signed   By: Inez Catalina M.D.   On: 06/23/2020 08:48   DG Ankle Complete Left  Result Date: 06/23/2020 CLINICAL DATA:  Recent fall with left ankle pain, initial encounter EXAM: LEFT ANKLE COMPLETE - 3+ VIEW COMPARISON:  None.  FINDINGS: No acute fracture or dislocation is noted. No soft tissue abnormality is noted. Small calcaneal spur is noted inferiorly. IMPRESSION: No acute abnormality noted. Electronically Signed   By: Inez Catalina M.D.   On: 06/23/2020 08:54   CT Head Wo Contrast  Result Date: 06/23/2020 CLINICAL DATA:  Fall down stairs.  Altered mental status. EXAM: CT HEAD WITHOUT CONTRAST TECHNIQUE: Contiguous axial images were obtained from the base of the skull through the vertex without intravenous contrast. COMPARISON:  07/05/2019 FINDINGS: Brain: Scattered calcifications throughout the brain are stable since prior study. No acute intracranial abnormality. Specifically, no hemorrhage, hydrocephalus, mass lesion, acute infarction, or significant intracranial injury. Vascular: No hyperdense vessel or unexpected calcification. Skull: No acute calvarial abnormality. Sinuses/Orbits: Mucosal thickening within the maxillary sinuses. No air-fluid levels. Mastoid air cells clear. Other: None IMPRESSION: No acute intracranial abnormality. Scattered calcifications, likely post inflammatory, stable. Electronically Signed   By: Rolm Baptise M.D.   On: 06/23/2020 08:40   CT Angio Chest PE W and/or Wo Contrast  Result Date: 06/23/2020 CLINICAL DATA:  [place] downstairs.  Chest and abdomen pain.  Hypoxic. EXAM: CT ANGIOGRAPHY CHEST CT ABDOMEN AND PELVIS WITH CONTRAST TECHNIQUE: Multidetector CT imaging of the chest was performed using the standard protocol during bolus administration of intravenous contrast. Multiplanar CT image reconstructions and MIPs were obtained to evaluate the vascular anatomy. Multidetector CT imaging of the abdomen and pelvis was performed using the standard protocol during bolus administration of intravenous contrast. CONTRAST:  153mL OMNIPAQUE IOHEXOL 350 MG/ML SOLN COMPARISON:  11/20/2019 FINDINGS: CTA CHEST FINDINGS Cardiovascular: The heart is normal in size. No pericardial effusion. Mild tortuosity of the  thoracic aorta and a few scattered aortic calcifications. No aneurysm. No definite coronary artery calcifications. The pulmonary arterial tree is well opacified. No filling defects to suggest pulmonary embolism. Mediastinum/Nodes: No mediastinal or hilar mass or adenopathy. The esophagus is unremarkable. Lungs/Pleura: Patchy peripheral ground-glass opacities, most notable in the lung apices. Findings could be due to an atypical/viral pneumonia. No focal airspace consolidation. No pleural effusions or worrisome pulmonary lesions or nodules. Musculoskeletal: No breast masses, supraclavicular or or axillary adenopathy. The thyroid gland is unremarkable. The bony thorax is intact. Review of the MIP images confirms the above findings. CT ABDOMEN and PELVIS FINDINGS Hepatobiliary: Hepatobiliary cirrhotic changes involving the liver with portal venous hypertension and extensive portal venous collaterals. Large splenorenal shunt is noted with a markedly enlarged left renal vein. The portal and splenic veins are patent. No focal hepatic lesion to suggest hepatoma. The gallbladder is surgically absent. Stable common bile duct dilatation and probable dilated cystic duct remnant. Pancreas: No mass, inflammation or ductal dilatation. Spleen: Normal size.  No splenic lesions. Adrenals/Urinary Tract: The adrenal glands and kidneys are unremarkable. No renal lesions or hydronephrosis. Moderate distention of the bladder is noted. Estimated volume is 700 cc. No bladder mass or calculi. Stomach/Bowel: Stomach the stomach, duodenum, small bowel and colon are grossly normal without oral contrast. No acute inflammatory changes, mass lesions or obstructive findings. The terminal ileum is normal. The appendix is normal. Vascular/Lymphatic: The aorta is normal in caliber. No dissection. The branch vessels are patent. The major venous structures are patent. No mesenteric or retroperitoneal mass or adenopathy. Small scattered lymph nodes are  noted. Reproductive: The uterus and ovaries are unremarkable. Other: No pelvic mass or adenopathy. No free pelvic fluid collections. No inguinal mass or adenopathy. No abdominal wall hernia or subcutaneous lesions. Musculoskeletal: No significant bony findings. The pubic symphysis and SI joints are intact. Mild SI joint degenerative changes. Both hips are normally located. Review of the MIP images confirms the above findings. IMPRESSION: 1. No CT findings for pulmonary embolism. 2. Patchy peripheral ground-glass opacities, most notable in the lung apices. Findings could be due to an atypical/viral pneumonia. 3. No acute abdominal/pelvic findings, mass lesions  or adenopathy. 4. Cirrhotic changes involving the liver with portal venous hypertension, extensive portal venous collaterals and large splenorenal shunt. No ascites. 5. Status post cholecystectomy with stable common bile duct dilatation and probable dilated cystic duct remnant. 6. Moderate distention of the bladder with estimated volume of 700 CC. 7. Aortic atherosclerosis. Aortic Atherosclerosis (ICD10-I70.0). Electronically Signed   By: Marijo Sanes M.D.   On: 06/23/2020 13:12   CT Cervical Spine Wo Contrast  Result Date: 06/23/2020 CLINICAL DATA:  Fall down stairs. EXAM: CT CERVICAL SPINE WITHOUT CONTRAST TECHNIQUE: Multidetector CT imaging of the cervical spine was performed without intravenous contrast. Multiplanar CT image reconstructions were also generated. COMPARISON:  04/01/2019 FINDINGS: Alignment: Normal Skull base and vertebrae: No acute fracture. No primary bone lesion or focal pathologic process. Soft tissues and spinal canal: No prevertebral fluid or swelling. No visible canal hematoma. Disc levels:  Early anterior spurring in the lower cervical spine. Upper chest: Biapical ground-glass airspace opacities. No pneumothorax. Other: None IMPRESSION: No acute bony abnormality in the cervical spine. Ground-glass opacities in the apices  bilaterally. These are nonspecific and could be inflammatory/infectious. Electronically Signed   By: Rolm Baptise M.D.   On: 06/23/2020 08:42   CT ABDOMEN PELVIS W CONTRAST  Result Date: 06/23/2020 CLINICAL DATA:  [place] downstairs.  Chest and abdomen pain.  Hypoxic. EXAM: CT ANGIOGRAPHY CHEST CT ABDOMEN AND PELVIS WITH CONTRAST TECHNIQUE: Multidetector CT imaging of the chest was performed using the standard protocol during bolus administration of intravenous contrast. Multiplanar CT image reconstructions and MIPs were obtained to evaluate the vascular anatomy. Multidetector CT imaging of the abdomen and pelvis was performed using the standard protocol during bolus administration of intravenous contrast. CONTRAST:  132mL OMNIPAQUE IOHEXOL 350 MG/ML SOLN COMPARISON:  11/20/2019 FINDINGS: CTA CHEST FINDINGS Cardiovascular: The heart is normal in size. No pericardial effusion. Mild tortuosity of the thoracic aorta and a few scattered aortic calcifications. No aneurysm. No definite coronary artery calcifications. The pulmonary arterial tree is well opacified. No filling defects to suggest pulmonary embolism. Mediastinum/Nodes: No mediastinal or hilar mass or adenopathy. The esophagus is unremarkable. Lungs/Pleura: Patchy peripheral ground-glass opacities, most notable in the lung apices. Findings could be due to an atypical/viral pneumonia. No focal airspace consolidation. No pleural effusions or worrisome pulmonary lesions or nodules. Musculoskeletal: No breast masses, supraclavicular or or axillary adenopathy. The thyroid gland is unremarkable. The bony thorax is intact. Review of the MIP images confirms the above findings. CT ABDOMEN and PELVIS FINDINGS Hepatobiliary: Hepatobiliary cirrhotic changes involving the liver with portal venous hypertension and extensive portal venous collaterals. Large splenorenal shunt is noted with a markedly enlarged left renal vein. The portal and splenic veins are patent. No focal  hepatic lesion to suggest hepatoma. The gallbladder is surgically absent. Stable common bile duct dilatation and probable dilated cystic duct remnant. Pancreas: No mass, inflammation or ductal dilatation. Spleen: Normal size.  No splenic lesions. Adrenals/Urinary Tract: The adrenal glands and kidneys are unremarkable. No renal lesions or hydronephrosis. Moderate distention of the bladder is noted. Estimated volume is 700 cc. No bladder mass or calculi. Stomach/Bowel: Stomach the stomach, duodenum, small bowel and colon are grossly normal without oral contrast. No acute inflammatory changes, mass lesions or obstructive findings. The terminal ileum is normal. The appendix is normal. Vascular/Lymphatic: The aorta is normal in caliber. No dissection. The branch vessels are patent. The major venous structures are patent. No mesenteric or retroperitoneal mass or adenopathy. Small scattered lymph nodes are noted. Reproductive: The uterus and  ovaries are unremarkable. Other: No pelvic mass or adenopathy. No free pelvic fluid collections. No inguinal mass or adenopathy. No abdominal wall hernia or subcutaneous lesions. Musculoskeletal: No significant bony findings. The pubic symphysis and SI joints are intact. Mild SI joint degenerative changes. Both hips are normally located. Review of the MIP images confirms the above findings. IMPRESSION: 1. No CT findings for pulmonary embolism. 2. Patchy peripheral ground-glass opacities, most notable in the lung apices. Findings could be due to an atypical/viral pneumonia. 3. No acute abdominal/pelvic findings, mass lesions or adenopathy. 4. Cirrhotic changes involving the liver with portal venous hypertension, extensive portal venous collaterals and large splenorenal shunt. No ascites. 5. Status post cholecystectomy with stable common bile duct dilatation and probable dilated cystic duct remnant. 6. Moderate distention of the bladder with estimated volume of 700 CC. 7. Aortic  atherosclerosis. Aortic Atherosclerosis (ICD10-I70.0). Electronically Signed   By: Marijo Sanes M.D.   On: 06/23/2020 13:12   DG Pelvis Portable  Result Date: 06/23/2020 CLINICAL DATA:  Fall. EXAM: PORTABLE PELVIS 1-2 VIEWS COMPARISON:  04/01/2019. FINDINGS: No acute bony or joint abnormality identified. No evidence of fracture dislocation. Pelvic calcifications consistent phleboliths. IMPRESSION: No acute abnormality. Electronically Signed   By: Marcello Moores  Register   On: 06/23/2020 07:31   DG Chest Port 1 View  Result Date: 06/23/2020 CLINICAL DATA:  Fall EXAM: PORTABLE CHEST 1 VIEW COMPARISON:  11/17/2019 FINDINGS: Normal heart size and mediastinal contours. No acute infiltrate or edema. No effusion or pneumothorax. No acute osseous findings. IMPRESSION: Negative portable chest. Electronically Signed   By: Monte Fantasia M.D.   On: 06/23/2020 07:30   DG Shoulder Left  Result Date: 06/23/2020 CLINICAL DATA:  Recent fall with left shoulder pain, initial encounter EXAM: LEFT SHOULDER - 2+ VIEW COMPARISON:  None. FINDINGS: Mild degenerative changes of the acromioclavicular joint are noted. No acute fracture or dislocation is noted. No soft tissue abnormality is noted IMPRESSION: No acute abnormality seen. Electronically Signed   By: Inez Catalina M.D.   On: 06/23/2020 08:51   DG Knee Complete 4 Views Left  Result Date: 06/23/2020 CLINICAL DATA:  Recent fall with knee pain, initial encounter EXAM: LEFT KNEE - COMPLETE 4+ VIEW COMPARISON:  None. FINDINGS: No evidence of fracture, dislocation, or joint effusion. No evidence of arthropathy or other focal bone abnormality. Soft tissues are unremarkable. IMPRESSION: No acute abnormality noted. Electronically Signed   By: Inez Catalina M.D.   On: 06/23/2020 08:52   DG Foot Complete Left  Result Date: 06/23/2020 CLINICAL DATA:  Recent fall with foot pain, initial encounter EXAM: LEFT FOOT - COMPLETE 3+ VIEW COMPARISON:  None. FINDINGS: No acute fracture or  dislocation is noted. Calcaneal spurring is seen. No soft tissue abnormality is noted. IMPRESSION: No acute abnormality noted. Electronically Signed   By: Inez Catalina M.D.   On: 06/23/2020 08:56    EKG: Independently reviewed.  Nursing no acute ST changes  Assessment/Plan Active Problems:   Aspiration pneumonia ([place])   Alcohol withdrawal ([place])  (please populate well all problems here in Problem List. (For example, if patient is on BP meds at home and you resume or decide to hold them, it is a problem that needs to be her. Same for CAD, COPD, HLD and so on)  Acute alcohol withdrawal with encephalopathy acute -CIWA protocol with scheduled benzo and tapering and as needed benzos -Consult case manager  Hypotension -Lactic acid normal, her baseline blood pressure systolic [phone], she received a total of 5  L IV boluses in the ED and blood pressure remain unchanged, start maintenance IV fluids with banana bag running at 150 mL/h, check TSH and cortisol level. -She might have pneumonia but unlikely sepsis  PNA -Probably aspirated, start Unasyn and change to p.o. ABX tomorrow   Cirrhosis -Discussed with patient regarding abstinence from alcohol -Mild elevation of ammonia level, start lactulose  Fall -PT evaluation  Pancytopenia -Related to alcohol abuse, discussed with patient regarding follow-up with hepatologist.  DVT prophylaxis: SCD Code Status: Full Family Communication: None at bedside Disposition Plan: Expect 1 to 2 days hospital stay to treat alcohol withdrawal Consults called: None Admission status: Telemetry observation   Lequita Halt MD [facility] Pager [phone]  06/23/2020, 4:52 PM

## 2020-06-23 NOTE — ED Notes (Signed)
Pt has urinated on the floor for the second time. Have gone through 3 different purewicks that she has taken out. Tech just advised that Pt has pulled out another IV.

## 2020-06-23 NOTE — ED Notes (Signed)
Patient placed on 1L O2 [place].

## 2020-06-23 NOTE — Progress Notes (Signed)
Pharmacy Antibiotic Note  Sara [name] is a 44 y.o. female admitted on 06/23/2020 with concern of aspiration pneumonia. Patient has bilateral patchy peripheral ground-glass opacities, most notably in the lung apices. Pharmacy has been consulted for Unasyn dosing.  Plan: Unasyn IV 3g q6h  Monitor renal function, clinical status and length of therapy  Deescalate therapy as appropriate  Height: 5\' 3"  (160 cm) Weight: 75 kg (165 lb 5.5 oz) IBW/kg (Calculated) : 52.4  Temp (24hrs), Avg:97.2 F (36.2 C), Min:96.6 F (35.9 C), Max:97.7 F (36.5 C)  Recent Labs  Lab 06/23/20 0715 06/23/20 1054  WBC 3.9*  --   CREATININE 0.49  --   LATICACIDVEN  --  1.9    Estimated Creatinine Clearance: 87.9 mL/min (by C-G formula based on SCr of 0.49 mg/dL).    Allergies  Allergen Reactions  . Morphine And Related Other (See Comments)    Slowed HR, lowered BP  . Nsaids Other (See Comments)    Caused internal bleeding    Antimicrobials this admission: 8/3 Unasyn >>  Microbiology results: None, no cultures pending  Thank you for allowing pharmacy to be a part of this patient's care.  Shauna Hugh, PharmD, [place]  PGY-1 Pharmacy Resident 06/23/2020 2:34 PM  Please check [email or web address] for unit-specific pharmacy phone numbers.

## 2020-06-23 NOTE — ED Notes (Signed)
Pt had completely pulled out IV and bled all over herslf and floor and call bell. Pt stated that she was trying to answer the phone and when she reached for it the IV just pulled itself off.    The Pt is alert and oriented and can answer questions and follow commands. Pt can follow the flow of conversation and interact.  Pt is steady on her feet as she stood initially to change her out and clean her up with the Tech.  Pt is very impulsive and does not think before acting. Pt will ask questions about previous conversations with the dr. However, when she is reminded of those conversations she remembers the conversation without further prompting by this RN.

## 2020-06-24 DIAGNOSIS — D649 Anemia, unspecified: Secondary | ICD-10-CM

## 2020-06-24 DIAGNOSIS — F10939 Alcohol use, unspecified with withdrawal, unspecified: Secondary | ICD-10-CM | POA: Diagnosis present

## 2020-06-24 DIAGNOSIS — D61818 Other pancytopenia: Secondary | ICD-10-CM

## 2020-06-24 DIAGNOSIS — F10231 Alcohol dependence with withdrawal delirium: Secondary | ICD-10-CM

## 2020-06-24 DIAGNOSIS — J690 Pneumonitis due to inhalation of food and vomit: Secondary | ICD-10-CM

## 2020-06-24 DIAGNOSIS — F10239 Alcohol dependence with withdrawal, unspecified: Secondary | ICD-10-CM | POA: Diagnosis present

## 2020-06-24 DIAGNOSIS — K766 Portal hypertension: Secondary | ICD-10-CM

## 2020-06-24 DIAGNOSIS — F319 Bipolar disorder, unspecified: Secondary | ICD-10-CM

## 2020-06-24 DIAGNOSIS — K7030 Alcoholic cirrhosis of liver without ascites: Secondary | ICD-10-CM

## 2020-06-24 DIAGNOSIS — F1020 Alcohol dependence, uncomplicated: Secondary | ICD-10-CM

## 2020-06-24 DIAGNOSIS — F411 Generalized anxiety disorder: Secondary | ICD-10-CM

## 2020-06-24 LAB — CBC
HCT: 35.9 % — ABNORMAL LOW (ref 36.0–46.0)
Hemoglobin: 11.3 g/dL — ABNORMAL LOW (ref 12.0–15.0)
MCH: 29.7 pg (ref 26.0–34.0)
MCHC: 31.5 g/dL (ref 30.0–36.0)
MCV: 94.2 fL (ref 80.0–100.0)
Platelets: 79 10*3/uL — ABNORMAL LOW (ref 150–400)
RBC: 3.81 MIL/uL — ABNORMAL LOW (ref 3.87–5.11)
RDW: 15.6 % — ABNORMAL HIGH (ref 11.5–15.5)
WBC: 3 10*3/uL — ABNORMAL LOW (ref 4.0–10.5)
nRBC: 0 % (ref 0.0–0.2)

## 2020-06-24 LAB — COMPREHENSIVE METABOLIC PANEL
ALT: 24 U/L (ref 0–44)
AST: 61 U/L — ABNORMAL HIGH (ref 15–41)
Albumin: 3.1 g/dL — ABNORMAL LOW (ref 3.5–5.0)
Alkaline Phosphatase: 57 U/L (ref 38–126)
Anion gap: 8 (ref 5–15)
BUN: <5 mg/dL — ABNORMAL LOW (ref 6–20)
CO2: 24 mmol/L (ref 22–32)
Calcium: 7.5 mg/dL — ABNORMAL LOW (ref 8.9–10.3)
Chloride: 110 mmol/L (ref 98–111)
Creatinine, Ser: 0.46 mg/dL (ref 0.44–1.00)
GFR calc Af Amer: >60 mL/min (ref >60)
GFR calc non Af Amer: >60 mL/min (ref >60)
Glucose, Bld: 87 mg/dL (ref 70–99)
Potassium: 3.6 mmol/L (ref 3.5–5.1)
Sodium: 142 mmol/L (ref 135–145)
Total Bilirubin: 1 mg/dL (ref 0.3–1.2)
Total Protein: 6.7 g/dL (ref 6.5–8.1)

## 2020-06-24 LAB — TSH: TSH: 3.658 u[IU]/mL (ref 0.350–4.500)

## 2020-06-24 LAB — CORTISOL-AM, BLOOD: Cortisol - AM: 5.8 ug/dL — ABNORMAL LOW (ref 6.7–22.6)

## 2020-06-24 MED ORDER — QUETIAPINE FUMARATE 50 MG PO TABS
100.0000 mg | ORAL_TABLET | Freq: Once | ORAL | Status: AC
  Administered 2020-06-24: 100 mg via ORAL
  Filled 2020-06-24: qty 2

## 2020-06-24 MED ORDER — PANTOPRAZOLE SODIUM 40 MG PO TBEC
40.0000 mg | DELAYED_RELEASE_TABLET | Freq: Every day | ORAL | Status: DC
  Administered 2020-06-24 – 2020-06-27 (×4): 40 mg via ORAL
  Filled 2020-06-24 (×4): qty 1

## 2020-06-24 MED ORDER — POTASSIUM CHLORIDE CRYS ER 20 MEQ PO TBCR
40.0000 meq | EXTENDED_RELEASE_TABLET | Freq: Once | ORAL | Status: AC
  Administered 2020-06-24: 40 meq via ORAL
  Filled 2020-06-24: qty 2

## 2020-06-24 MED ORDER — ADULT MULTIVITAMIN W/MINERALS CH
1.0000 | ORAL_TABLET | Freq: Every day | ORAL | Status: DC
  Administered 2020-06-24 – 2020-06-27 (×4): 1 via ORAL
  Filled 2020-06-24 (×4): qty 1

## 2020-06-24 MED ORDER — DEXTROSE IN LACTATED RINGERS 5 % IV SOLN: INTRAVENOUS | Status: DC

## 2020-06-24 MED ORDER — ONDANSETRON HCL 4 MG/2ML IJ SOLN
4.0000 mg | Freq: Four times a day (QID) | INTRAMUSCULAR | Status: DC | PRN
  Administered 2020-06-24: 4 mg via INTRAVENOUS
  Filled 2020-06-24: qty 2

## 2020-06-24 NOTE — Progress Notes (Signed)
Pharmacy Antibiotic Note  Sara [name] is a 44 y.o. female admitted on 06/23/2020 with concern of aspiration pneumonia. Patient has bilateral patchy peripheral ground-glass opacities, most notably in the lung apices. Pharmacy has been consulted for Unasyn dosing. Pt is afebrile and WBC is slightly low. Renal function is WNL and stable.   Plan:  Continue Unasyn IV 3g q6h  Monitor renal function, clinical status and length of therapy  Deescalate therapy as appropriate *Pharmacy will sign off as no further dose adjustments are anticipated. Thank you!  Height: 5\' 3"  (160 cm) Weight: 75 kg (165 lb 5.5 oz) IBW/kg (Calculated) : 52.4  Temp (24hrs), Avg:97 F (36.1 C), Min:96.6 F (35.9 C), Max:97.7 F (36.5 C)  Recent Labs  Lab 06/23/20 0715 06/23/20 1054 06/24/20 0433  WBC 3.9*  --  3.0*  CREATININE 0.49  --  0.46  LATICACIDVEN  --  1.9  --     Estimated Creatinine Clearance: 87.9 mL/min (by C-G formula based on SCr of 0.46 mg/dL).    Allergies  Allergen Reactions  . Morphine And Related Other (See Comments)    Slowed HR, lowered BP  . Nsaids Other (See Comments)    Caused internal bleeding    Antimicrobials this admission: 8/3 Unasyn >>  Microbiology results: 8/3 COVID - NEG  Thank you for allowing pharmacy to be a part of this patient's care.  Salome Arnt, PharmD, BCPS Clinical Pharmacist Please see AMION for all pharmacy numbers 06/24/2020 7:58 AM

## 2020-06-24 NOTE — Progress Notes (Signed)
PROGRESS NOTE    Sara [name]  IHK:[id number] DOB: 13-Aug-1976 DOA: 06/23/2020 PCP: Patient, No Pcp Per    Brief Narrative:  Patient admitted to the hospital working diagnosis of alcohol withdrawal syndrome, complicated with aspiration pneumonia, present on admission.  Female who presented after mechanical fall.  She does have significant past medical history for chronic alcoholic cirrhosis, pancytopenia, depression and anxiety.  Patient was consuming alcohol, about 12 ounce beer when she failed down the stairs landing on her left side.  She denied any loss of consciousness.  On her initial physical examination blood pressure 82/55, heart rate 63, respirate 20, temperature 96.8, oxygen saturation 94%.  She had dry mucous membranes, her lungs are clear to auscultation bilaterally, heart S1-S2, present rhythmic, her abdomen was soft, no lower extremity edema.  Positive tremors bilateral upper extremities Sodium 143, potassium 3.4, chloride 110, bicarb 24, glucose 88, BUN less than 5, creatinine 0.49, white count 3.9, hemoglobin 12.4, hematocrit 39.0, platelets 99.  SARS COVID-19 negative.  Urinalysis specific gravity 1.002. Head and neck CT no acute changes. CT of the abdomen with no acute changes, positive liver cirrhosis with signs of portal hypertension, moderate bladder distention, 700 cc ml retention.  Chest radiograph no infiltrates. Chest CTA with biapical groundglass opacities.  Negative for PE.  Assessment & Plan:   Principal Problem:   Alcohol withdrawal syndrome (HCC) Active Problems:   Anxiety state   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HCC)   Depression   GERD (gastroesophageal reflux disease)   Portal hypertension (HCC)   Anemia   Alcohol dependence syndrome (HCC)   Post traumatic stress disorder (PTSD)   Thrombocytopenia (HCC)   Pancytopenia ([place])   Bipolar 1 disorder (HCC)   Aspiration pneumonia ([place])   1. Acute alcohol syndrome. Patient continue to have tremors and anxiety,  improved with benzodiazepines. Poor oral intake, no nausea or vomiting.   Will continue alcohol withdrawal protocol with CIWA, continue neuro checks per unit protocol. Consult PT/OT and nutrition.  Continue vitamins including thiamine  2. Alcoholic liver cirrhosis. No signs of acute decompensations, no clinical or radiographic ascites.   Continue lactulose and thiamine.   3. GERD. Will resume pantoprazole daily and continue as needed antiemetics.  4. Depression/ PTSD/ bipolar. Continue with escitalopram   5. Bilateral apical aspiration pneumonia (present on admission). Will continue aspiration precautions and will follow with speech therapy.  Continue antibiotic therapy with Unasyn. Follow cell count, cultures and temperature curve.   6. Chronic pancytopenia. Hgb at 11,3 with wbc 3,0 and PLT 79. Will continue close follow up on cell count, not current indication for transfusion.   7. Hypokalemia. Renal function with serum cr at 0,46 with K at 3,6 and serum bicarbonate is 24.  Will add 40 kcl po and will follow up renal function in am, will continue hydration with balanced electrolyte solutions.   Patient continue to be at high risk for high risk for worsening alcohol withdrawal.   Status is: Observation  The patient will require care spanning > 2 midnights and should be moved to inpatient because: IV treatments appropriate due to intensity of illness or inability to take PO  Dispo: The patient is from: Home              Anticipated d/c is to: Home              Anticipated d/c date is: 3 days              Patient currently is not medically  stable to d/c.   DVT prophylaxis: Enoxaparin   Code Status:   full  Family Communication:  No family at the bedside        Antimicrobials:   Unasyn     Subjective: Patient continue to have anxiety and tremors, no nausea or vomiting, no chest pain. Not yet back to her baseline.   Objective: Vitals:   06/24/20 1230 06/24/20 1300  06/24/20 1330 06/24/20 1400  BP: 102/76 110/60 107/61 (!) 100/57  Pulse: 96 93 (!) 101 (!) 102  Resp: 18 17 17 17   Temp:      TempSrc:      SpO2: 93% 93% 94% 94%  Weight:      Height:        Intake/Output Summary (Last 24 hours) at 06/24/2020 1420 Last data filed at 06/24/2020 1308 Gross per 24 hour  Intake 1314.81 ml  Output 1005 ml  Net 309.81 ml   Filed Weights   06/23/20 0644  Weight: 75 kg    Examination:   General: deconditioned and ill looking appearing  Neurology: mild somnolence post lorazepam, no current agitation  E ENT: mild pallor, no icterus, oral mucosa  Dry Cardiovascular: No JVD. S1-S2 present, rhythmic, no gallops, rubs, or murmurs. Trace bilateral lower extremity edema. Pulmonary: positive breath sounds bilaterally, adequate air movement, no wheezing, rhonchi or rales. Gastrointestinal. Abdomen mild distended, non tender, no signs of ascites.  Skin. No rashes Musculoskeletal: no joint deformities     Data Reviewed: I have personally reviewed following labs and imaging studies  CBC: Recent Labs  Lab 06/23/20 0715 06/24/20 0433  WBC 3.9* 3.0*  HGB 12.4 11.3*  HCT 39.0 35.9*  MCV 94.7 94.2  PLT 99* 79*   Basic Metabolic Panel: Recent Labs  Lab 06/23/20 0715 06/24/20 0433  NA 143 142  K 3.4* 3.6  CL 110 110  CO2 24 24  GLUCOSE 88 87  BUN <5* <5*  CREATININE 0.49 0.46  CALCIUM 8.3* 7.5*   GFR: Estimated Creatinine Clearance: 87.9 mL/min (by C-G formula based on SCr of 0.46 mg/dL). Liver Function Tests: Recent Labs  Lab 06/23/20 0715 06/24/20 0433  AST 56* 61*  ALT 23 24  ALKPHOS 69 57  BILITOT 1.0 1.0  PROT 7.3 6.7  ALBUMIN 3.4* 3.1*   No results for input(s): LIPASE, AMYLASE in the last 168 hours. Recent Labs  Lab 06/23/20 0716  AMMONIA 61*   Coagulation Profile: Recent Labs  Lab 06/23/20 1145 06/23/20 1905  INR 1.4* 1.4*   Cardiac Enzymes: No results for input(s): CKTOTAL, CKMB, CKMBINDEX, TROPONINI in the last  168 hours. BNP (last 3 results) No results for input(s): PROBNP in the last 8760 hours. HbA1C: No results for input(s): HGBA1C in the last 72 hours. CBG: Recent Labs  Lab 06/23/20 0643 06/23/20 1337  GLUCAP 90  90 86   Lipid Profile: No results for input(s): CHOL, HDL, LDLCALC, TRIG, CHOLHDL, LDLDIRECT in the last 72 hours. Thyroid Function Tests: Recent Labs    06/24/20 0433  TSH 3.658   Anemia Panel: No results for input(s): VITAMINB12, FOLATE, FERRITIN, TIBC, IRON, RETICCTPCT in the last 72 hours.    Radiology Studies: I have reviewed all of the imaging during this hospital visit personally     Scheduled Meds: . escitalopram  20 mg Oral Daily  . LORazepam  0-4 mg Oral Q6H   Followed by  . [START ON 06/25/2020] LORazepam  0-4 mg Oral Q12H  . thiamine  100 mg Oral Daily  .  topiramate  50 mg Oral BID   Continuous Infusions: . ampicillin-sulbactam (UNASYN) IV 3 g (06/24/20 1317)  . banana bag IV 1000 mL Stopped (06/24/20 1308)     LOS: 0 days        Mclean Moya Gerome Apley, MD

## 2020-06-24 NOTE — ED Notes (Signed)
Breakfast Ordered 

## 2020-06-24 NOTE — Evaluation (Signed)
Physical Therapy Evaluation Patient Details Name: Sara [name] MRN: [id number] DOB: 05-16-1976 Today's Date: 06/24/2020   History of Present Illness  Sara [name] is a 44 y.o. 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  This happened last night, and was unwitnessed.  Patient initially denied she was drunk before this happened, but later admitted she was drunk and fell down the stairs landing on the left side.  She felt aching pain of the left elbow and left knee and hip.  She really admitted drinking a 12 ounce beer last night before she fell.  She also reported 2 times vomiting last night after the fall, with stomach content.  She developed some dry cough this morning but no short of breath.  But her alcohol level this morning was more than 400.   Imagine of sites of pain negative for acute abnormalities/fx's.  Clinical Impression  Pt is at or close to baseline functioning and should be safe at home with available assist. There are no further acute PT needs.  Will sign off at this time.     Follow Up Recommendations No PT follow up    Equipment Recommendations  None recommended by PT    Recommendations for Other Services       Precautions / Restrictions Precautions Precautions: Fall (minor when sober)      Mobility  Bed Mobility Overal bed mobility: Modified Independent                Transfers Overall transfer level: Modified independent                  Ambulation/Gait Ambulation/Gait assistance: Independent Gait Distance (Feet): 300 Feet Assistive device: None Gait Pattern/deviations: Step-through pattern   Gait velocity interpretation: 1.31 - 2.62 ft/sec, indicative of limited community ambulator General Gait Details: steady, no signs of L knee pain with gait.  Pt able to scan, back up and turn abruptly as well as change speed  readily.  Stairs            Wheelchair Mobility    Modified Rankin (Stroke Patients Only)       Balance Overall balance assessment: Independent (in home-like environment)                                           Pertinent Vitals/Pain Pain Assessment: Faces Faces Pain Scale: Hurts little more Pain Location: knee on left Pain Descriptors / Indicators: Aching;Guarding Pain Intervention(s): Monitored during session    Home Living Family/patient expects to be discharged to:: Private residence Living Arrangements: Spouse/significant other;Children Available Help at Discharge: Family;Available 24 hours/day Type of Home: House Home Access: Stairs to enter Entrance Stairs-Rails: Psychiatric nurse of Steps: several Home Layout: Two level Home Equipment: Environmental consultant - 2 wheels;Crutches      Prior Function Level of Independence: Independent               Hand Dominance        Extremity/Trunk Assessment   Upper Extremity Assessment Upper Extremity Assessment: Overall WFL for tasks assessed    Lower Extremity Assessment Lower Extremity Assessment: Overall WFL for tasks assessed       Communication   Communication: No difficulties  Cognition Arousal/Alertness: Awake/alert Behavior During Therapy: WFL for tasks assessed/performed Overall Cognitive Status: Within Functional Limits for tasks  assessed                                        General Comments General comments (skin integrity, edema, etc.): vss`    Exercises     Assessment/Plan    PT Assessment Patent does not need any further PT services  PT Problem List         PT Treatment Interventions      PT Goals (Current goals can be found in the Care Plan section)  Acute Rehab PT Goals PT Goal Formulation: All assessment and education complete, DC therapy    Frequency     Barriers to discharge        Co-evaluation                AM-PAC PT "6 Clicks" Mobility  Outcome Measure Help needed turning from your back to your side while in a flat bed without using bedrails?: None Help needed moving from lying on your back to sitting on the side of a flat bed without using bedrails?: None Help needed moving to and from a bed to a chair (including a wheelchair)?: None Help needed standing up from a chair using your arms (e.g., wheelchair or bedside chair)?: None Help needed to walk in hospital room?: None Help needed climbing 3-5 steps with a railing? : None 6 Click Score: 24    End of Session   Activity Tolerance: Patient tolerated treatment well Patient left: in bed;with call bell/phone within reach;with nursing/sitter in room Nurse Communication: Mobility status PT Visit Diagnosis: Unsteadiness on feet (R26.81)    Time: 3154-0086 PT Time Calculation (min) (ACUTE ONLY): 15 min   Charges:   PT Evaluation $PT Eval Low Complexity: 1 Low          06/24/2020  Ginger Carne., PT Acute Rehabilitation Services [phone]  (pager) [phone]  (office)  Tessie Fass Avery Eustice 06/24/2020, 12:47 PM

## 2020-06-24 NOTE — ED Notes (Signed)
Lunch Tray Ordered @ 1059.  

## 2020-06-25 DIAGNOSIS — W19XXXA Unspecified fall, initial encounter: Secondary | ICD-10-CM

## 2020-06-25 LAB — MAGNESIUM: Magnesium: 1.9 mg/dL (ref 1.7–2.4)

## 2020-06-25 MED ORDER — POTASSIUM CHLORIDE CRYS ER 20 MEQ PO TBCR
40.0000 meq | EXTENDED_RELEASE_TABLET | Freq: Once | ORAL | Status: AC
  Administered 2020-06-25: 40 meq via ORAL
  Filled 2020-06-25: qty 2

## 2020-06-25 MED ORDER — ENSURE ENLIVE PO LIQD
237.0000 mL | Freq: Two times a day (BID) | ORAL | Status: DC
  Administered 2020-06-25 – 2020-06-27 (×4): 237 mL via ORAL
  Filled 2020-06-25: qty 237

## 2020-06-25 NOTE — Progress Notes (Addendum)
PROGRESS NOTE    Sara [name]  NID:[id number] DOB: 10-31-1976 DOA: 06/23/2020 PCP: Patient, No Pcp Per    Brief Narrative:  Patient admitted to the hospital working diagnosis of alcohol withdrawal syndrome, complicated with aspiration pneumonia, present on admission.  Female who presented after mechanical fall.  She does have significant past medical history for chronic alcoholic cirrhosis, pancytopenia, depression and anxiety.  Patient was consuming alcohol, about 12 ounce beer when she failed down the stairs landing on her left side.  She denied any loss of consciousness.  On her initial physical examination blood pressure 82/55, heart rate 63, respirate 20, temperature 96.8, oxygen saturation 94%.  She had dry mucous membranes, her lungs are clear to auscultation bilaterally, heart S1-S2, present rhythmic, her abdomen was soft, no lower extremity edema.  Positive tremors bilateral upper extremities Sodium 143, potassium 3.4, chloride 110, bicarb 24, glucose 88, BUN less than 5, creatinine 0.49, white count 3.9, hemoglobin 12.4, hematocrit 39.0, platelets 99.  SARS COVID-19 negative.  Urinalysis specific gravity 1.002. Head and neck CT no acute changes. CT of the abdomen with no acute changes, positive liver cirrhosis with signs of portal hypertension, moderate bladder distention, 700 cc ml retention.  Chest radiograph no infiltrates. Chest CTA with biapical groundglass opacities.  Negative for PE.  Patient placed on alcohol withdrawal protocol and antibiotic therapy with good toleration.    Assessment & Plan:   Principal Problem:   Alcohol withdrawal syndrome (HCC) Active Problems:   Anxiety state   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HCC)   Depression   GERD (gastroesophageal reflux disease)   Portal hypertension (HCC)   Anemia   Alcohol dependence syndrome (HCC)   Post traumatic stress disorder (PTSD)   Thrombocytopenia (HCC)   Pancytopenia ([place])   Bipolar 1 disorder (HCC)    Aspiration pneumonia ([place])   1. Acute alcohol withdrawal syndrome. This am with no tremors or agitation. She has received 1 mg of lorazepam today with good toleration. Patient had one dose of seroquel last night.   Continue CIWA per protocol along with vitamins including thiamine.   Patient reports having support as outpatient to aid in alcohol cessation.   2. Alcoholic liver cirrhosis. Stable with no clinical decompensation, continue with lactulose and thiamine.   3. GERD. On pantoprazole. Tolerating po well, no nausea or vomiting.   4. Depression/ PTSD/ bipolar. On escitalopram and topiramate.   5. Bilateral apical aspiration pneumonia (present on admission).  Oxygenation well 91% on room air, wbc is 3,0 and cultures continue with no growth.   Continue with Unasyn, plan to transition to po Augmentin in am.   6. Chronic pancytopenia. Stable cell count with wbc at 3,0, hgb 11,3 and plt at 79.  Continue to follow cell count in am.   7. Hypokalemia. Stable renal function with serum cr at 0,46, K is 3,6 and serum bicarbonate at 24. Mg at 1,9.   Add 40 meq Kcl today and follow renal panel in am. Patient is off IV fluids.    Status is: Inpatient  Remains inpatient appropriate because:IV treatments appropriate due to intensity of illness or inability to take PO   Dispo: The patient is from: Home              Anticipated d/c is to: Home              Anticipated d/c date is: 1 day              Patient currently is not medically  stable to d/c.   DVT prophylaxis: Enoxaparin   Code Status:    fuill  Family Communication: no family at the bedside        Subjective: Patient is feeling better, her anxiety is better with anxiolytics, no tremors, no nausea or vomiting, no dyspnea or chest pain.   Objective: Vitals:   06/24/20 1400 06/24/20 1439 06/24/20 2330 06/25/20 0608  BP: (!) 100/57 113/76 104/65 108/74  Pulse: (!) 102 (!) 105 80 78  Resp: 17 16 18 18   Temp:  99.1  F (37.3 C) 98.3 F (36.8 C) 98.4 F (36.9 C)  TempSrc:  Oral Oral Oral  SpO2: 94% 92% 95% 94%  Weight:      Height:        Intake/Output Summary (Last 24 hours) at 06/25/2020 1001 Last data filed at 06/25/2020 0300 Gross per 24 hour  Intake 1263.46 ml  Output 1 ml  Net 1262.46 ml   Filed Weights   06/23/20 0644  Weight: 75 kg    Examination:   General: deconditioned  Neurology: Awake and alert, non focal, no tremors.  E ENT: no pallor, no icterus, oral mucosa moist Cardiovascular: No JVD. S1-S2 present, rhythmic, no gallops, rubs, or murmurs. No lower extremity edema. Pulmonary:positive breath sounds bilaterally, adequate air movement, no wheezing, rhonchi or rales. Gastrointestinal. Abdomen soft and non tender Skin. No rashes Musculoskeletal: no joint deformities     Data Reviewed: I have personally reviewed following labs and imaging studies  CBC: Recent Labs  Lab 06/23/20 0715 06/24/20 0433  WBC 3.9* 3.0*  HGB 12.4 11.3*  HCT 39.0 35.9*  MCV 94.7 94.2  PLT 99* 79*   Basic Metabolic Panel: Recent Labs  Lab 06/23/20 0715 06/24/20 0433 06/25/20 0328  NA 143 142  --   K 3.4* 3.6  --   CL 110 110  --   CO2 24 24  --   GLUCOSE 88 87  --   BUN <5* <5*  --   CREATININE 0.49 0.46  --   CALCIUM 8.3* 7.5*  --   MG  --   --  1.9   GFR: Estimated Creatinine Clearance: 87.9 mL/min (by C-G formula based on SCr of 0.46 mg/dL). Liver Function Tests: Recent Labs  Lab 06/23/20 0715 06/24/20 0433  AST 56* 61*  ALT 23 24  ALKPHOS 69 57  BILITOT 1.0 1.0  PROT 7.3 6.7  ALBUMIN 3.4* 3.1*   No results for input(s): LIPASE, AMYLASE in the last 168 hours. Recent Labs  Lab 06/23/20 0716  AMMONIA 61*   Coagulation Profile: Recent Labs  Lab 06/23/20 1145 06/23/20 1905  INR 1.4* 1.4*   Cardiac Enzymes: No results for input(s): CKTOTAL, CKMB, CKMBINDEX, TROPONINI in the last 168 hours. BNP (last 3 results) No results for input(s): PROBNP in the last 8760  hours. HbA1C: No results for input(s): HGBA1C in the last 72 hours. CBG: Recent Labs  Lab 06/23/20 0643 06/23/20 1337  GLUCAP 90  90 86   Lipid Profile: No results for input(s): CHOL, HDL, LDLCALC, TRIG, CHOLHDL, LDLDIRECT in the last 72 hours. Thyroid Function Tests: Recent Labs    06/24/20 0433  TSH 3.658   Anemia Panel: No results for input(s): VITAMINB12, FOLATE, FERRITIN, TIBC, IRON, RETICCTPCT in the last 72 hours.    Radiology Studies: I have reviewed all of the imaging during this hospital visit personally     Scheduled Meds: . escitalopram  20 mg Oral Daily  . LORazepam  0-4 mg  Oral Q6H   Followed by  . LORazepam  0-4 mg Oral Q12H  . multivitamin with minerals  1 tablet Oral Daily  . pantoprazole  40 mg Oral Daily  . thiamine  100 mg Oral Daily  . topiramate  50 mg Oral BID   Continuous Infusions: . ampicillin-sulbactam (UNASYN) IV 3 g (06/25/20 0549)  . dextrose 5% lactated ringers 75 mL/hr at 06/25/20 0929     LOS: 1 day        Sara Sultana Gerome Apley, MD

## 2020-06-25 NOTE — Evaluation (Signed)
Clinical/Bedside Swallow Evaluation Patient Details  Name: Sara [name] MRN: [id number] Date of Birth: Dec 26, 1975  Today's Date: 06/25/2020 Time: SLP Start Time (ACUTE ONLY): 1049 SLP Stop Time (ACUTE ONLY): 1100 SLP Time Calculation (min) (ACUTE ONLY): 11 min  Past Medical History:  Past Medical History:  Diagnosis Date  . Alcohol abuse   . Alcoholism ([place])   . Anemia   . Anxiety   . Blood transfusion without reported diagnosis   . Cirrhosis ([place])   . Depression   . 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 GERD (gastroesophageal reflux disease)   . Heart murmur    Patient states she may have  . Menorrhagia   . Pancytopenia ([place]) 01/15/2014  . Pneumonia   . Portal hypertension ([place])   . S/P alcohol detoxification    2-3 days at behavioral health previously  . UGI bleed 06/12/2014   Past Surgical History:  Past Surgical History:  Procedure Laterality Date  . CHOLECYSTECTOMY    . ESOPHAGOGASTRODUODENOSCOPY N/A 06/12/2014   Procedure: ESOPHAGOGASTRODUODENOSCOPY (EGD);  Surgeon: Gatha Mayer, MD;  Location: [facility];  Service: Endoscopy;  Laterality: N/A;  . ESOPHAGOGASTRODUODENOSCOPY (EGD) WITH PROPOFOL N/A 07/29/2014   Procedure: ESOPHAGOGASTRODUODENOSCOPY (EGD) WITH PROPOFOL;  Surgeon: Inda Castle, MD;  Location: [facility];  Service: Endoscopy;  Laterality: N/A;  . ESOPHAGOGASTRODUODENOSCOPY (EGD) WITH PROPOFOL N/A 01/20/2018   Procedure: ESOPHAGOGASTRODUODENOSCOPY (EGD) WITH PROPOFOL;  Surgeon: Mauri Pole, MD;  Location: [facility];  Service: Endoscopy;  Laterality: N/A;   HPI:  44 y.o. F presents after mechanical fall down 10 stairs with PMH of alcohol abuse, cirrhosis, anemia, esophageal varices, GERD, portal hypertension, and heart murmur. She had emesis x2 after the fall with coughing noted after; concern for aspiration pneumonitis. On alcohol withdrawal protocol.   Assessment / Plan / Recommendation Clinical Impression  Pt demonstrates  functional oropharyngeal swallow without s/s aspiration. Oral mech exam was unremarkable. She passed the 3 oz water challenge by consuming 3 oz consecutively without s/s aspiration. She consumed saltine crackers with timely mastication and adequate oral clearance. Given that concern is for aspiration pneumonitis (versus pneumonia) from emesis event, no further ST is indicated. She is recommended to continue thin liquids and regular solids with meds taken whole with water. Pt was educated on results and recommendations and demonstrated understanding and agreement.    SLP Visit Diagnosis: Dysphagia, unspecified (R13.10)    Aspiration Risk  No limitations    Diet Recommendation Regular;Thin liquid   Liquid Administration via: Straw;Cup Medication Administration: Whole meds with liquid Supervision: Patient able to self feed Postural Changes: Seated upright at 90 degrees    Other  Recommendations Oral Care Recommendations: Oral care BID   Follow up Recommendations None        Swallow Study   General Date of Onset: 06/24/20 HPI: 44 y.o. F presents after mechanical fall down 10 stairs with PMH of alcohol abuse, cirrhosis, anemia, esophageal varices, GERD, portal hypertension, and heart murmur. She had emesis x2 after the fall with coughing noted after; concern for aspiration pneumonitis. On alcohol withdrawal protocol. Type of Study: Bedside Swallow Evaluation Previous Swallow Assessment: n/a Diet Prior to this Study: Regular;Thin liquids Temperature Spikes Noted: No Respiratory Status: Room air History of Recent Intubation: No Behavior/Cognition: Alert;Cooperative;Pleasant mood Oral Cavity Assessment: Within Functional Limits Oral Care Completed by SLP: Recent completion by staff Oral Cavity - Dentition: Adequate natural dentition Vision: Functional for self-feeding Self-Feeding Abilities: Able to feed self Patient Positioning: Upright in bed Baseline Vocal Quality:  Normal Volitional Cough: Strong Volitional Swallow: Able to elicit    Oral/Motor/Sensory Function Overall Oral Motor/Sensory Function: Within functional limits   Thin Liquid Thin Liquid: Within functional limits Presentation: Cup;Straw    Solid     Solid: Within functional limits Presentation: [place]. Deandra Gadson, M.S., CCC-SLP Speech-Language Pathologist Acute Rehabilitation Services Pager: [place] 06/25/2020,11:02 AM

## 2020-06-25 NOTE — Progress Notes (Signed)
Initial Nutrition Assessment  DOCUMENTATION CODES:   Not applicable  INTERVENTION:  Provide Ensure Enlive po BID, each supplement provides 350 kcal and 20 grams of protein.  Encourage adequate PO intake.   NUTRITION DIAGNOSIS:   Increased nutrient needs related to chronic illness (cirrhosis) as evidenced by estimated needs.  GOAL:   Patient will meet greater than or equal to 90% of their needs  MONITOR:   PO intake, Supplement acceptance, Skin, Weight trends, I & O's, Labs  REASON FOR ASSESSMENT:   Consult Assessment of nutrition requirement/status  ASSESSMENT:   44 y.o. 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 Pt on alcohol withdrawal protocol with [facility]WA.  Pt reports having a good appetite currently and PTA with usual consumption of at least 3 meals a day with no difficulties. Meal completion this morning however only 10%. RD to order nutritional supplements to aid in caloric and protein needs. Pt with no weight loss per weight records.   NUTRITION - FOCUSED PHYSICAL EXAM:    Most Recent Value  Orbital Region No depletion  Upper Arm Region No depletion  Thoracic and Lumbar Region No depletion  Buccal Region No depletion  Temple Region No depletion  Clavicle Bone Region No depletion  Clavicle and Acromion Bone Region No depletion  Scapular Bone Region No depletion  Dorsal Hand No depletion  Patellar Region No depletion  Anterior Thigh Region No depletion  Posterior Calf Region No depletion  Edema (RD Assessment) None  Hair Reviewed  Eyes Reviewed  Mouth Reviewed  Skin Reviewed  Nails Reviewed     Labs and medications reviewed.   Diet Order:   Diet Order            Diet regular Room service appropriate? Yes; Fluid consistency: Thin  Diet effective now                 EDUCATION NEEDS:   Not appropriate for education at this  time  Skin:  Skin Assessment: Reviewed RN Assessment  Last BM:  Unknown  Height:   Ht Readings from Last 1 Encounters:  06/23/20 5\' 3"  (1.6 m)    Weight:   Wt Readings from Last 1 Encounters:  06/23/20 75 kg   BMI:  Body mass index is 29.29 kg/m.  Estimated Nutritional Needs:   Kcal:  2000-2200  Protein:  100-115 grams  Fluid:  >/= 2 L/day   Corrin Parker, MS, RD, LDN RD pager number/after hours weekend pager number on Amion.
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
## 2020-06-25 NOTE — Evaluation (Signed)
Occupational Therapy Evaluation Patient Details Name: Sara [name] MRN: [id number] DOB: 03-19-1976 Today's Date: 06/25/2020    History of Present Illness Sara [name] is a 44 y.o. 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  This happened last night, and was unwitnessed.  Patient initially denied she was drunk before this happened, but later admitted she was drunk and fell down the stairs landing on the left side.  She felt aching pain of the left elbow and left knee and hip.  She really admitted drinking a 12 ounce beer last night before she fell.  She also reported 2 times vomiting last night after the fall, with stomach content.  She developed some dry cough this morning but no short of breath.  But her alcohol level this morning was more than 400.   Imagine of sites of pain negative for acute abnormalities/fx's.   Clinical Impression   Pt independent at baseline for mobility and ADL. Today Pt was able to perform UB and LB ADL, standing grooming at sink (including fine motor tasks, opening containers sequencing etc), transfers and meet all needs without physical or cognitive assist. Pt at baseline, and OT to sign off.     Follow Up Recommendations  No OT follow up    Equipment Recommendations  None recommended by OT    Recommendations for Other Services       Precautions / Restrictions Precautions Precautions: Fall (minor when sober) Restrictions Weight Bearing Restrictions: No      Mobility Bed Mobility Overal bed mobility: Modified Independent                Transfers Overall transfer level: Modified independent                    Balance Overall balance assessment: Independent (in home-like environment)                                         ADL either performed or assessed with clinical judgement   ADL Overall  ADL's : At baseline                                       General ADL Comments: able to demonstrate transfers, UB and LB ADL, standing grooming at sink without LOB or deficits     Vision Patient Visual Report: No change from baseline       Perception     Praxis      Pertinent Vitals/Pain Pain Assessment: Faces Faces Pain Scale: Hurts a little bit Pain Location: L knee Pain Descriptors / Indicators: Aching;Guarding Pain Intervention(s): Limited activity within patient's tolerance;Monitored during session;Repositioned     Hand Dominance     Extremity/Trunk Assessment Upper Extremity Assessment Upper Extremity Assessment: Overall WFL for tasks assessed   Lower Extremity Assessment Lower Extremity Assessment: Overall WFL for tasks assessed       Communication Communication Communication: No difficulties   Cognition Arousal/Alertness: Awake/alert Behavior During Therapy: WFL for tasks assessed/performed Overall Cognitive Status: Within Functional Limits for tasks assessed  General Comments       Exercises     Shoulder Instructions      Home Living Family/patient expects to be discharged to:: Private residence Living Arrangements: Spouse/significant other;Children Available Help at Discharge: Family;Available 24 hours/day Type of Home: House Home Access: Stairs to enter [employer] of Steps: several Entrance Stairs-Rails: Right;Left Home Layout: Two level Alternate Level Stairs-Number of Steps: flight Alternate Level Stairs-Rails: Right;Left Bathroom Shower/Tub: Tub/shower unit         Home Equipment: Environmental consultant - 2 wheels;Crutches   Additional Comments: does not work currently      Prior Functioning/Environment Level of Independence: Independent                 OT Problem List: Impaired balance (sitting and/or standing)      OT Treatment/Interventions:      OT  Goals(Current goals can be found in the care plan section) Acute Rehab OT Goals Patient Stated Goal: to get home OT Goal Formulation: With patient Time For Goal Achievement: 07/09/20 Potential to Achieve Goals: Good  OT Frequency:     Barriers to D/C:            Co-evaluation              AM-PAC OT "6 Clicks" Daily Activity     Outcome Measure Help from another person eating meals?: None Help from another person taking care of personal grooming?: None Help from another person toileting, which includes using toliet, bedpan, or urinal?: None Help from another person bathing (including washing, rinsing, drying)?: None Help from another person to put on and taking off regular upper body clothing?: None Help from another person to put on and taking off regular lower body clothing?: None 6 Click Score: 24   End of Session Equipment Utilized During Treatment: Gait belt Nurse Communication: Mobility status  Activity Tolerance: Patient tolerated treatment well Patient left: in bed;with call bell/phone within reach  OT Visit Diagnosis: History of falling (Z91.81)                Time: 1062-6948 OT Time Calculation (min): 12 min Charges:  OT General Charges $OT Visit: 1 Visit OT Evaluation $OT Eval Low Complexity: [place] OTR/L Acute Rehabilitation Services Pager: [phone] Office: [phone]  Merri Ray Jonn Chaikin 06/25/2020, 11:27 AM

## 2020-06-26 ENCOUNTER — Other Ambulatory Visit: Payer: Self-pay

## 2020-06-26 DIAGNOSIS — F329 Major depressive disorder, single episode, unspecified: Secondary | ICD-10-CM

## 2020-06-26 DIAGNOSIS — F4310 Post-traumatic stress disorder, unspecified: Secondary | ICD-10-CM

## 2020-06-26 DIAGNOSIS — K219 Gastro-esophageal reflux disease without esophagitis: Secondary | ICD-10-CM

## 2020-06-26 DIAGNOSIS — D696 Thrombocytopenia, unspecified: Secondary | ICD-10-CM

## 2020-06-26 LAB — CBC WITH DIFFERENTIAL/PLATELET
Abs Immature Granulocytes: 0 10*3/uL (ref 0.00–0.07)
Basophils Absolute: 0 10*3/uL (ref 0.0–0.1)
Basophils Relative: 0 %
Eosinophils Absolute: 0 10*3/uL (ref 0.0–0.5)
Eosinophils Relative: 1 %
HCT: 36.7 % (ref 36.0–46.0)
Hemoglobin: 12.2 g/dL (ref 12.0–15.0)
Immature Granulocytes: 0 %
Lymphocytes Relative: 22 %
Lymphs Abs: 0.7 10*3/uL (ref 0.7–4.0)
MCH: 30.1 pg (ref 26.0–34.0)
MCHC: 33.2 g/dL (ref 30.0–36.0)
MCV: 90.6 fL (ref 80.0–100.0)
Monocytes Absolute: 0.2 10*3/uL (ref 0.1–1.0)
Monocytes Relative: 7 %
Neutro Abs: 2.1 10*3/uL (ref 1.7–7.7)
Neutrophils Relative %: 70 %
Platelets: 66 10*3/uL — ABNORMAL LOW (ref 150–400)
RBC: 4.05 MIL/uL (ref 3.87–5.11)
RDW: 15 % (ref 11.5–15.5)
WBC: 3 10*3/uL — ABNORMAL LOW (ref 4.0–10.5)
nRBC: 0 % (ref 0.0–0.2)

## 2020-06-26 LAB — BASIC METABOLIC PANEL
Anion gap: 9 (ref 5–15)
BUN: 6 mg/dL (ref 6–20)
CO2: 20 mmol/L — ABNORMAL LOW (ref 22–32)
Calcium: 9.2 mg/dL (ref 8.9–10.3)
Chloride: 108 mmol/L (ref 98–111)
Creatinine, Ser: 0.63 mg/dL (ref 0.44–1.00)
GFR calc Af Amer: >60 mL/min (ref >60)
GFR calc non Af Amer: >60 mL/min (ref >60)
Glucose, Bld: 127 mg/dL — ABNORMAL HIGH (ref 70–99)
Potassium: 3.4 mmol/L — ABNORMAL LOW (ref 3.5–5.1)
Sodium: 137 mmol/L (ref 135–145)

## 2020-06-26 MED ORDER — QUETIAPINE FUMARATE 50 MG PO TABS
100.0000 mg | ORAL_TABLET | Freq: Every day | ORAL | Status: DC
  Administered 2020-06-26: 100 mg via ORAL
  Filled 2020-06-26: qty 2

## 2020-06-26 MED ORDER — POTASSIUM CHLORIDE CRYS ER 20 MEQ PO TBCR
40.0000 meq | EXTENDED_RELEASE_TABLET | Freq: Once | ORAL | Status: AC
  Administered 2020-06-26: 40 meq via ORAL
  Filled 2020-06-26: qty 2

## 2020-06-26 MED ORDER — AMOXICILLIN-POT CLAVULANATE 875-125 MG PO TABS
1.0000 | ORAL_TABLET | Freq: Two times a day (BID) | ORAL | Status: DC
  Administered 2020-06-26 – 2020-06-27 (×3): 1 via ORAL
  Filled 2020-06-26 (×5): qty 1

## 2020-06-26 NOTE — Progress Notes (Signed)
PROGRESS NOTE    Sara [name]  HBZ:[id number] DOB: Mar 12, 1976 DOA: 06/23/2020 PCP: Patient, No Pcp Per    Brief Narrative:  Sara [name] is a 44 year old female with past medical history notable for chronic alcoholic cirrhosis, pancytopenia, depression, anxiety who presented to the ED following a fall down stairs.  She denies loss of consciousness.  Patient was consuming alcohol at the time, reports 12 ounce beer.  In the ED, BP 82/55, HR 63, RR 20, temperature 96.8, SPO2 94% on room air.  Sodium 143, potassium 3.4, chloride 110, CO2 24, glucose 88, BUN less than 5, creatinine 0.49.  WBC 3.9, hemoglobin 12.4, hematocrit 39.0, platelets 99.  SARS Covid-19 PCR negative.  Urinalysis unrevealing.  CT head/neck with no acute findings.  CT abdomen without acute changes, positive liver cirrhosis with signs of portal hypertension and moderate bladder distention.  Chest x-ray with no acute cardiopulmonary disease findings.  CTA chest with biapical groundglass opacities, negative for PE.  She was demonstrating tremors bilateral upper extremities.  EDP consulted [facility] for admission given concern for acute alcohol withdrawal and aspiration pneumonia.   Assessment & Plan:   Principal Problem:   Alcohol withdrawal syndrome (HCC) Active Problems:   Anxiety state   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HCC)   Depression   GERD (gastroesophageal reflux disease)   Portal hypertension (HCC)   Anemia   Alcohol dependence syndrome (HCC)   Post traumatic stress disorder (PTSD)   Thrombocytopenia (HCC)   Pancytopenia (HCC)   Bipolar 1 disorder (HCC)   Aspiration pneumonia ([place])   Acute alcohol withdrawal syndrome Patient presenting following fall admitting to alcohol use.  EtOH level on admission 400.  She was also displaying signs of upper extremity tremors even with elevated alcohol level. --Continue CIWAA with symptom triggered Ativan; received 1 dose so far today --Continue thiamine, folic acid,  multivitamin --Discussed need for complete cessation given her underlying cirrhosis, pancytopenia --Social work for substance abuse counseling/resources  Aspiration pneumonia, present on admission CTA with biapical groundglass opacities. --Unasyn the escalated to Augmentin 875-125 mg p.o. twice daily today, plan to complete 7-day course  Alcoholic liver cirrhosis, stable --Continue lactulose; Ensure 2-3 soft BMs daily  GERD: Continue PPI  Depression PTSD Bipolar disorder --Continue topiramate and escitalopram --Seroquel 100 mg p.o. nightly  Chronic pancytopenia Stable, WBC 3.0, hemoglobin low 0.3, platelets 79.  Hypokalemia Potassium 3.4 today, will replete. --Repeat electrolytes in a.m. to include magnesium   DVT prophylaxis: SCDs Code Status: Full code Family Communication: None present at bedside this morning  Disposition Plan:  Status is: Inpatient  Remains inpatient appropriate because:Altered mental status, Unsafe d/c plan, IV treatments appropriate due to intensity of illness or inability to take PO and Inpatient level of care appropriate due to severity of illness continues with withdrawal symptoms requiring Ativan   Dispo: The patient is from: Home              Anticipated d/c is to: Home              Anticipated d/c date is: 2 days              Patient currently is not medically stable to d/c.   Consultants:   None  Procedures:   None  Antimicrobials:   Unasyn 8/3 - 8/6  Augmentin 8/6>>   Subjective: Patient seen and examined bedside, resting comfortably.  Continues with some mild tremors, feelings of "head fullness", and unsteady gait while ambulating.  States withdrawal symptoms are slowly improving each  day although.  Did receive dose of Ativan last night and this morning.  Appetite remains poor.  Discussed with patient need for complete alcohol cessation given her underlying pancytopenia and cirrhosis.  No other complaints or concerns at this  time.  Denies headache, no fever/chills/night sweats, nausea/vomiting/diarrhea, no chest pain, palpitations, no shortness of breath, no abdominal pain.  No acute events overnight per nursing staff.  Objective: Vitals:   06/25/20 1650 06/26/20 0050 06/26/20 0451 06/26/20 1220  BP: 113/78 107/70 104/75 102/71  Pulse: 74 78 68 93  Resp: 18 18 18 16   Temp: 99.1 F (37.3 C) 98.3 F (36.8 C) 98.4 F (36.9 C) 98.3 F (36.8 C)  TempSrc: Oral Oral Oral Oral  SpO2: 93% 93% 93% 94%  Weight:      Height:        Intake/Output Summary (Last 24 hours) at 06/26/2020 1409 Last data filed at 06/26/2020 0326 Gross per 24 hour  Intake 640 ml  Output --  Net 640 ml   Filed Weights   06/23/20 0644  Weight: 75 kg    Examination:  General exam: Appears calm and comfortable, appears older than stated age Respiratory system: Clear to auscultation. Respiratory effort normal. Cardiovascular system: S1 & S2 heard, RRR. No JVD, murmurs, rubs, gallops or clicks. No pedal edema. Gastrointestinal system: Abdomen is nondistended, soft and nontender. No organomegaly or masses felt. Normal bowel sounds heard. Central nervous system: Alert and oriented. No focal neurological deficits. + Slight tremors on hand/arm extension bilaterally Extremities: Symmetric 5 x 5 power. Skin: No rashes, lesions or ulcers Psychiatry: Judgement and insight appear poor. Mood & affect appropriate.     Data Reviewed: I have personally reviewed following labs and imaging studies  CBC: Recent Labs  Lab 06/23/20 0715 06/24/20 0433 06/26/20 1002  WBC 3.9* 3.0* 3.0*  NEUTROABS  --   --  2.1  HGB 12.4 11.3* 12.2  HCT 39.0 35.9* 36.7  MCV 94.7 94.2 90.6  PLT 99* 79* 66*   Basic Metabolic Panel: Recent Labs  Lab 06/23/20 0715 06/24/20 0433 06/25/20 0328 06/26/20 1002  NA 143 142  --  137  K 3.4* 3.6  --  3.4*  CL 110 110  --  108  CO2 24 24  --  20*  GLUCOSE 88 87  --  127*  BUN <5* <5*  --  6  CREATININE 0.49 0.46   --  0.63  CALCIUM 8.3* 7.5*  --  9.2  MG  --   --  1.9  --    GFR: Estimated Creatinine Clearance: 87.9 mL/min (by C-G formula based on SCr of 0.63 mg/dL). Liver Function Tests: Recent Labs  Lab 06/23/20 0715 06/24/20 0433  AST 56* 61*  ALT 23 24  ALKPHOS 69 57  BILITOT 1.0 1.0  PROT 7.3 6.7  ALBUMIN 3.4* 3.1*   No results for input(s): LIPASE, AMYLASE in the last 168 hours. Recent Labs  Lab 06/23/20 0716  AMMONIA 61*   Coagulation Profile: Recent Labs  Lab 06/23/20 1145 06/23/20 1905  INR 1.4* 1.4*   Cardiac Enzymes: No results for input(s): CKTOTAL, CKMB, CKMBINDEX, TROPONINI in the last 168 hours. BNP (last 3 results) No results for input(s): PROBNP in the last 8760 hours. HbA1C: No results for input(s): HGBA1C in the last 72 hours. CBG: Recent Labs  Lab 06/23/20 0643 06/23/20 1337  GLUCAP 90  90 86   Lipid Profile: No results for input(s): CHOL, HDL, LDLCALC, TRIG, CHOLHDL, LDLDIRECT in the  last 72 hours. Thyroid Function Tests: Recent Labs    06/24/20 0433  TSH 3.658   Anemia Panel: No results for input(s): VITAMINB12, FOLATE, FERRITIN, TIBC, IRON, RETICCTPCT in the last 72 hours. Sepsis Labs: Recent Labs  Lab 06/23/20 1054  LATICACIDVEN 1.9    Recent Results (from the past 240 hour(s))  SARS Coronavirus 2 by RT PCR (hospital order, performed in [facility] hospital lab) Nasopharyngeal Nasopharyngeal Swab     Status: None   Collection Time: 06/23/20 10:54 AM   Specimen: Nasopharyngeal Swab  Result Value Ref Range Status   SARS Coronavirus 2 NEGATIVE NEGATIVE Final    Comment: (NOTE) SARS-CoV-2 target nucleic acids are NOT DETECTED.  The SARS-CoV-2 RNA is generally detectable in upper and lower respiratory specimens during the acute phase of infection. The lowest concentration of SARS-CoV-2 viral copies this assay can detect is 250 copies / mL. A negative result does not preclude SARS-CoV-2 infection and should not be used as the sole  basis for treatment or other patient management decisions.  A negative result may occur with improper specimen collection / handling, submission of specimen other than nasopharyngeal swab, presence of viral mutation(s) within the areas targeted by this assay, and inadequate number of viral copies (<250 copies / mL). A negative result must be combined with clinical observations, patient history, and epidemiological information.  Fact Sheet for Patients:   StrictlyIdeas.no  Fact Sheet for Healthcare Providers: BankingDealers.co.za  This test is not yet approved or  cleared by the [place] FDA and has been authorized for detection and/or diagnosis of SARS-CoV-2 by FDA under an Emergency Use Authorization (EUA).  This EUA will remain in effect (meaning this test can be used) for the duration of the COVID-19 declaration under Section 564(b)(1) of the Act, 21 U.S.C. section 360bbb-3(b)(1), unless the authorization is terminated or revoked sooner.  Performed at [facility], [place] [address], [place], [place] [address]          Radiology Studies: No results found.      Scheduled Meds: . amoxicillin-clavulanate  1 tablet Oral Q12H  . escitalopram  20 mg Oral Daily  . feeding supplement (ENSURE ENLIVE)  237 mL Oral BID BM  . LORazepam  0-4 mg Oral Q12H  . multivitamin with minerals  1 tablet Oral Daily  . pantoprazole  40 mg Oral Daily  . QUEtiapine  100 mg Oral QHS  . thiamine  100 mg Oral Daily  . topiramate  50 mg Oral BID   Continuous Infusions:   LOS: 2 days    Time spent: 39 minutes spent on chart review, discussion with nursing staff, consultants, updating family and interview/physical exam; more than 50% of that time was spent in counseling and/or coordination of care.    Wanda Cellucci J [place] DO [facility] Available via Epic secure chat 7am-7pm After these hours, please refer to coverage provider  listed on [email or web address] 06/26/2020, 2:09 PM

## 2020-06-27 LAB — BASIC METABOLIC PANEL
Anion gap: 10 (ref 5–15)
BUN: 8 mg/dL (ref 6–20)
CO2: 18 mmol/L — ABNORMAL LOW (ref 22–32)
Calcium: 8.6 mg/dL — ABNORMAL LOW (ref 8.9–10.3)
Chloride: 108 mmol/L (ref 98–111)
Creatinine, Ser: 0.51 mg/dL (ref 0.44–1.00)
GFR calc Af Amer: >60 mL/min (ref >60)
GFR calc non Af Amer: >60 mL/min (ref >60)
Glucose, Bld: 84 mg/dL (ref 70–99)
Potassium: 3.6 mmol/L (ref 3.5–5.1)
Sodium: 136 mmol/L (ref 135–145)

## 2020-06-27 LAB — MAGNESIUM: Magnesium: 2 mg/dL (ref 1.7–2.4)

## 2020-06-27 MED ORDER — SODIUM CHLORIDE 0.9 % IV BOLUS
1000.0000 mL | Freq: Once | INTRAVENOUS | Status: AC
  Administered 2020-06-27: 1000 mL via INTRAVENOUS

## 2020-06-27 MED ORDER — PANTOPRAZOLE SODIUM 40 MG PO TBEC: 40.0000 mg | DELAYED_RELEASE_TABLET | Freq: Every day | ORAL | 0 refills | Status: DC

## 2020-06-27 MED ORDER — LACTULOSE 10 GM/15ML PO SOLN: 30.0000 g | Freq: Two times a day (BID) | ORAL | 0 refills | Status: DC | PRN

## 2020-06-27 MED ORDER — ADULT MULTIVITAMIN W/MINERALS CH: 1.0000 | ORAL_TABLET | Freq: Every day | ORAL | 0 refills | Status: AC

## 2020-06-27 MED ORDER — AMOXICILLIN-POT CLAVULANATE 875-125 MG PO TABS: 1.0000 | ORAL_TABLET | Freq: Two times a day (BID) | ORAL | 0 refills | Status: AC

## 2020-06-27 MED ORDER — THIAMINE HCL 100 MG PO TABS: 100.0000 mg | ORAL_TABLET | Freq: Every day | ORAL | 0 refills | Status: AC

## 2020-06-27 NOTE — Discharge Summary (Signed)
Physician Discharge Summary  Sara [name] TKW:[id number] DOB: 1976-07-11 DOA: 06/23/2020  PCP: Patient, No Pcp Per  Admit date: 06/23/2020 Discharge date: 06/27/2020  Admitted From: Home Disposition: Home  Recommendations for Outpatient Follow-up:  1. Follow up with PCP in 1-2 weeks 2. Continue Augmentin for aspiration pneumonia 3. Encouraged continued alcohol cessation given her underlying cirrhosis, would benefit from outpatient rehabilitation services  Home Health: No Equipment/Devices: None  Discharge Condition: Stable CODE STATUS: Full code Diet recommendation: Low protein diet, low-salt  History of present illness:  Sara [name] is a 44 year old female with past medical history notable for chronic alcoholic cirrhosis, pancytopenia, depression, anxiety who presented to the ED following a fall down stairs.  She denies loss of consciousness.  Patient was consuming alcohol at the time, reports 12 ounce beer.  In the ED, BP 82/55, HR 63, RR 20, temperature 96.8, SPO2 94% on room air.  Sodium 143, potassium 3.4, chloride 110, CO2 24, glucose 88, BUN less than 5, creatinine 0.49.  WBC 3.9, hemoglobin 12.4, hematocrit 39.0, platelets 99.  SARS Covid-19 PCR negative.  Urinalysis unrevealing.  CT head/neck with no acute findings.  CT abdomen without acute changes, positive liver cirrhosis with signs of portal hypertension and moderate bladder distention.  Chest x-ray with no acute cardiopulmonary disease findings.  CTA chest with biapical groundglass opacities, negative for PE. She was demonstrating tremors bilateral upper extremities.  EDP consulted [facility] for admission given concern for acute alcohol withdrawal and aspiration pneumonia.  Hospital course:  Acute alcohol withdrawal syndrome Patient presenting following fall admitting to alcohol use.  EtOH level on admission 400.  She was also displaying signs of upper extremity tremors even with elevated alcohol level. Patient was placed on CIWAA   protocol with symptom triggered Ativan. She was started on thiamine, folic acid, multivitamin. She is seen by social work but declined any substance abuse counseling. Discussed with patient need for complete cessation given her underlying cirrhosis and pancytopenia. Patient's withdrawal symptoms have resolved and is ready for discharge home.  Aspiration pneumonia, present on admission CTA with biapical groundglass opacities. Patient was initially started on Unasyn which was de-escalated to Augmentin 875-125 mg p.o. twice daily which she will continue outpatient to complete 7-day course  Alcoholic liver cirrhosis, stable Continue lactulose; Ensure 2-3 soft BMs daily. Encourage alcohol cessation  GERD: Continue PPI  Depression PTSD Bipolar disorder Continue topiramate and escitalopram. Seroquel 100 mg p.o. nightly  Chronic pancytopenia Stable, WBC 3.0, hemoglobin low 0.3, platelets 79.  Hypokalemia Repleted during hospitalization.  Discharge Diagnoses:  Active Problems:   Anxiety state   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[place])   Depression   GERD (gastroesophageal reflux disease)   Portal hypertension ([facility])   Anemia   Alcohol dependence syndrome ([facility])   Post traumatic stress disorder (PTSD)   Thrombocytopenia (H[facility])   Pancytopenia ([place])   Bipolar 1 disorder (H[facility])   Aspiration pneumonia [facility])    Discharge Instructions  Discharge Instructions    Call MD for:  difficulty breathing, headache or visual disturbances   Complete by: As directed    Call MD for:  extreme fatigue   Complete by: As directed    Call MD for:  persistant dizziness or light-headedness   Complete by: As directed    Call MD for:  persistant nausea and vomiting   Complete by: As directed    Call MD for:  severe uncontrolled pain   Complete by: As directed    Call MD for:  temperature >100.4  Complete by: As directed    Diet - low sodium heart healthy   Complete by: As directed    Increase activity  slowly   Complete by: As directed      Allergies as of 06/27/2020      Reactions   Morphine And Related Other (See Comments)   Slowed HR, lowered BP   Nsaids Other (See Comments)   Caused internal bleeding      Medication List    STOP taking these medications   azithromycin 250 MG tablet Commonly known as: ZITHROMAX   chlordiazePOXIDE 25 MG capsule Commonly known as: LIBRIUM   guaiFENesin 200 MG tablet   lidocaine 5 % Commonly known as: Lidoderm   methocarbamol 500 MG tablet Commonly known as: ROBAXIN   predniSONE 20 MG tablet Commonly known as: DELTASONE     TAKE these medications   amoxicillin-clavulanate 875-125 MG tablet Commonly known as: AUGMENTIN Take 1 tablet by mouth 2 (two) times daily for 4 days.   escitalopram 20 MG tablet Commonly known as: LEXAPRO Take 1 tablet (20 mg total) by mouth daily.   lactulose 10 GM/15ML solution Commonly known as: CHRONULAC Take 45 mLs (30 g total) by mouth 2 (two) times daily as needed for mild constipation.   multivitamin with minerals Tabs tablet Take 1 tablet by mouth daily. Start taking on: June 28, 2020   pantoprazole 40 MG tablet Commonly known as: PROTONIX Take 1 tablet (40 mg total) by mouth daily. Start taking on: June 28, 2020   QUEtiapine 100 MG tablet Commonly known as: SEROQUEL Take 1 tablet (100 mg total) by mouth at bedtime.   thiamine 100 MG tablet Take 1 tablet (100 mg total) by mouth daily. Start taking on: June 28, 2020   topiramate 50 MG tablet Commonly known as: TOPAMAX Take 1 tablet (50 mg total) by mouth 2 (two) times daily.       Follow-up [place]. Go to.   Specialty: Internal Medicine Why: August 04, 2020 at 0920 am  Contact information: [place] [id number] [place] [address] [phone]             Allergies  Allergen Reactions  . Morphine And Related Other (See Comments)    Slowed HR, lowered  BP  . Nsaids Other (See Comments)    Caused internal bleeding    Consultations:  None   Procedures/Studies: DG Elbow Complete Left  Result Date: 06/23/2020 CLINICAL DATA:  Un witnessed fall with elbow pain, initial encounter EXAM: LEFT ELBOW - COMPLETE 3+ VIEW COMPARISON:  None. FINDINGS: There is no evidence of fracture, dislocation, or joint effusion. There is no evidence of arthropathy or other focal bone abnormality. Soft tissues are unremarkable. IMPRESSION: No acute abnormality noted Electronically Signed   By: Inez Catalina M.D.   On: 06/23/2020 08:49   DG Wrist Complete Left  Result Date: 06/23/2020 CLINICAL DATA:  Recent fall downstairs with wrist pain, initial encounter EXAM: LEFT WRIST - COMPLETE 3+ VIEW COMPARISON:  None. FINDINGS: There is no evidence of fracture or dislocation. There is no evidence of arthropathy or other focal bone abnormality. Soft tissues are unremarkable. IMPRESSION: No acute abnormality noted. Electronically Signed   By: Inez Catalina M.D.   On: 06/23/2020 08:48   DG Ankle Complete Left  Result Date: 06/23/2020 CLINICAL DATA:  Recent fall with left ankle pain, initial encounter EXAM: LEFT ANKLE COMPLETE - 3+ VIEW COMPARISON:  None. FINDINGS:  No acute fracture or dislocation is noted. No soft tissue abnormality is noted. Small calcaneal spur is noted inferiorly. IMPRESSION: No acute abnormality noted. Electronically Signed   By: Inez Catalina M.D.   On: 06/23/2020 08:54   CT Head Wo Contrast  Result Date: 06/23/2020 CLINICAL DATA:  Fall down stairs.  Altered mental status. EXAM: CT HEAD WITHOUT CONTRAST TECHNIQUE: Contiguous axial images were obtained from the base of the skull through the vertex without intravenous contrast. COMPARISON:  07/05/2019 FINDINGS: Brain: Scattered calcifications throughout the brain are stable since prior study. No acute intracranial abnormality. Specifically, no hemorrhage, hydrocephalus, mass lesion, acute infarction, or significant  intracranial injury. Vascular: No hyperdense vessel or unexpected calcification. Skull: No acute calvarial abnormality. Sinuses/Orbits: Mucosal thickening within the maxillary sinuses. No air-fluid levels. Mastoid air cells clear. Other: None IMPRESSION: No acute intracranial abnormality. Scattered calcifications, likely post inflammatory, stable. Electronically Signed   By: Rolm Baptise M.D.   On: 06/23/2020 08:40   CT Angio Chest PE W and/or Wo Contrast  Result Date: 06/23/2020 CLINICAL DATA:  [place] downstairs.  Chest and abdomen pain.  Hypoxic. EXAM: CT ANGIOGRAPHY CHEST CT ABDOMEN AND PELVIS WITH CONTRAST TECHNIQUE: Multidetector CT imaging of the chest was performed using the standard protocol during bolus administration of intravenous contrast. Multiplanar CT image reconstructions and MIPs were obtained to evaluate the vascular anatomy. Multidetector CT imaging of the abdomen and pelvis was performed using the standard protocol during bolus administration of intravenous contrast. CONTRAST:  183mL OMNIPAQUE IOHEXOL 350 MG/ML SOLN COMPARISON:  11/20/2019 FINDINGS: CTA CHEST FINDINGS Cardiovascular: The heart is normal in size. No pericardial effusion. Mild tortuosity of the thoracic aorta and a few scattered aortic calcifications. No aneurysm. No definite coronary artery calcifications. The pulmonary arterial tree is well opacified. No filling defects to suggest pulmonary embolism. Mediastinum/Nodes: No mediastinal or hilar mass or adenopathy. The esophagus is unremarkable. Lungs/Pleura: Patchy peripheral ground-glass opacities, most notable in the lung apices. Findings could be due to an atypical/viral pneumonia. No focal airspace consolidation. No pleural effusions or worrisome pulmonary lesions or nodules. Musculoskeletal: No breast masses, supraclavicular or or axillary adenopathy. The thyroid gland is unremarkable. The bony thorax is intact. Review of the MIP images confirms the above findings. CT ABDOMEN  and PELVIS FINDINGS Hepatobiliary: Hepatobiliary cirrhotic changes involving the liver with portal venous hypertension and extensive portal venous collaterals. Large splenorenal shunt is noted with a markedly enlarged left renal vein. The portal and splenic veins are patent. No focal hepatic lesion to suggest hepatoma. The gallbladder is surgically absent. Stable common bile duct dilatation and probable dilated cystic duct remnant. Pancreas: No mass, inflammation or ductal dilatation. Spleen: Normal size.  No splenic lesions. Adrenals/Urinary Tract: The adrenal glands and kidneys are unremarkable. No renal lesions or hydronephrosis. Moderate distention of the bladder is noted. Estimated volume is 700 cc. No bladder mass or calculi. Stomach/Bowel: Stomach the stomach, duodenum, small bowel and colon are grossly normal without oral contrast. No acute inflammatory changes, mass lesions or obstructive findings. The terminal ileum is normal. The appendix is normal. Vascular/Lymphatic: The aorta is normal in caliber. No dissection. The branch vessels are patent. The major venous structures are patent. No mesenteric or retroperitoneal mass or adenopathy. Small scattered lymph nodes are noted. Reproductive: The uterus and ovaries are unremarkable. Other: No pelvic mass or adenopathy. No free pelvic fluid collections. No inguinal mass or adenopathy. No abdominal wall hernia or subcutaneous lesions. Musculoskeletal: No significant bony findings. The pubic symphysis and SI joints  are intact. Mild SI joint degenerative changes. Both hips are normally located. Review of the MIP images confirms the above findings. IMPRESSION: 1. No CT findings for pulmonary embolism. 2. Patchy peripheral ground-glass opacities, most notable in the lung apices. Findings could be due to an atypical/viral pneumonia. 3. No acute abdominal/pelvic findings, mass lesions or adenopathy. 4. Cirrhotic changes involving the liver with portal venous  hypertension, extensive portal venous collaterals and large splenorenal shunt. No ascites. 5. Status post cholecystectomy with stable common bile duct dilatation and probable dilated cystic duct remnant. 6. Moderate distention of the bladder with estimated volume of 700 CC. 7. Aortic atherosclerosis. Aortic Atherosclerosis (ICD10-I70.0). Electronically Signed   By: Marijo Sanes M.D.   On: 06/23/2020 13:12   CT Cervical Spine Wo Contrast  Result Date: 06/23/2020 CLINICAL DATA:  Fall down stairs. EXAM: CT CERVICAL SPINE WITHOUT CONTRAST TECHNIQUE: Multidetector CT imaging of the cervical spine was performed without intravenous contrast. Multiplanar CT image reconstructions were also generated. COMPARISON:  04/01/2019 FINDINGS: Alignment: Normal Skull base and vertebrae: No acute fracture. No primary bone lesion or focal pathologic process. Soft tissues and spinal canal: No prevertebral fluid or swelling. No visible canal hematoma. Disc levels:  Early anterior spurring in the lower cervical spine. Upper chest: Biapical ground-glass airspace opacities. No pneumothorax. Other: None IMPRESSION: No acute bony abnormality in the cervical spine. Ground-glass opacities in the apices bilaterally. These are nonspecific and could be inflammatory/infectious. Electronically Signed   By: Rolm Baptise M.D.   On: 06/23/2020 08:42   CT ABDOMEN PELVIS W CONTRAST  Result Date: 06/23/2020 CLINICAL DATA:  [place] downstairs.  Chest and abdomen pain.  Hypoxic. EXAM: CT ANGIOGRAPHY CHEST CT ABDOMEN AND PELVIS WITH CONTRAST TECHNIQUE: Multidetector CT imaging of the chest was performed using the standard protocol during bolus administration of intravenous contrast. Multiplanar CT image reconstructions and MIPs were obtained to evaluate the vascular anatomy. Multidetector CT imaging of the abdomen and pelvis was performed using the standard protocol during bolus administration of intravenous contrast. CONTRAST:  176mL OMNIPAQUE IOHEXOL  350 MG/ML SOLN COMPARISON:  11/20/2019 FINDINGS: CTA CHEST FINDINGS Cardiovascular: The heart is normal in size. No pericardial effusion. Mild tortuosity of the thoracic aorta and a few scattered aortic calcifications. No aneurysm. No definite coronary artery calcifications. The pulmonary arterial tree is well opacified. No filling defects to suggest pulmonary embolism. Mediastinum/Nodes: No mediastinal or hilar mass or adenopathy. The esophagus is unremarkable. Lungs/Pleura: Patchy peripheral ground-glass opacities, most notable in the lung apices. Findings could be due to an atypical/viral pneumonia. No focal airspace consolidation. No pleural effusions or worrisome pulmonary lesions or nodules. Musculoskeletal: No breast masses, supraclavicular or or axillary adenopathy. The thyroid gland is unremarkable. The bony thorax is intact. Review of the MIP images confirms the above findings. CT ABDOMEN and PELVIS FINDINGS Hepatobiliary: Hepatobiliary cirrhotic changes involving the liver with portal venous hypertension and extensive portal venous collaterals. Large splenorenal shunt is noted with a markedly enlarged left renal vein. The portal and splenic veins are patent. No focal hepatic lesion to suggest hepatoma. The gallbladder is surgically absent. Stable common bile duct dilatation and probable dilated cystic duct remnant. Pancreas: No mass, inflammation or ductal dilatation. Spleen: Normal size.  No splenic lesions. Adrenals/Urinary Tract: The adrenal glands and kidneys are unremarkable. No renal lesions or hydronephrosis. Moderate distention of the bladder is noted. Estimated volume is 700 cc. No bladder mass or calculi. Stomach/Bowel: Stomach the stomach, duodenum, small bowel and colon are grossly normal without oral contrast.  No acute inflammatory changes, mass lesions or obstructive findings. The terminal ileum is normal. The appendix is normal. Vascular/Lymphatic: The aorta is normal in caliber. No  dissection. The branch vessels are patent. The major venous structures are patent. No mesenteric or retroperitoneal mass or adenopathy. Small scattered lymph nodes are noted. Reproductive: The uterus and ovaries are unremarkable. Other: No pelvic mass or adenopathy. No free pelvic fluid collections. No inguinal mass or adenopathy. No abdominal wall hernia or subcutaneous lesions. Musculoskeletal: No significant bony findings. The pubic symphysis and SI joints are intact. Mild SI joint degenerative changes. Both hips are normally located. Review of the MIP images confirms the above findings. IMPRESSION: 1. No CT findings for pulmonary embolism. 2. Patchy peripheral ground-glass opacities, most notable in the lung apices. Findings could be due to an atypical/viral pneumonia. 3. No acute abdominal/pelvic findings, mass lesions or adenopathy. 4. Cirrhotic changes involving the liver with portal venous hypertension, extensive portal venous collaterals and large splenorenal shunt. No ascites. 5. Status post cholecystectomy with stable common bile duct dilatation and probable dilated cystic duct remnant. 6. Moderate distention of the bladder with estimated volume of 700 CC. 7. Aortic atherosclerosis. Aortic Atherosclerosis (ICD10-I70.0). Electronically Signed   By: Marijo Sanes M.D.   On: 06/23/2020 13:12   DG Pelvis Portable  Result Date: 06/23/2020 CLINICAL DATA:  Fall. EXAM: PORTABLE PELVIS 1-2 VIEWS COMPARISON:  04/01/2019. FINDINGS: No acute bony or joint abnormality identified. No evidence of fracture dislocation. Pelvic calcifications consistent phleboliths. IMPRESSION: No acute abnormality. Electronically Signed   By: Marcello Moores  Register   On: 06/23/2020 07:31   DG Chest Port 1 View  Result Date: 06/23/2020 CLINICAL DATA:  Fall EXAM: PORTABLE CHEST 1 VIEW COMPARISON:  11/17/2019 FINDINGS: Normal heart size and mediastinal contours. No acute infiltrate or edema. No effusion or pneumothorax. No acute osseous  findings. IMPRESSION: Negative portable chest. Electronically Signed   By: Monte Fantasia M.D.   On: 06/23/2020 07:30   DG Shoulder Left  Result Date: 06/23/2020 CLINICAL DATA:  Recent fall with left shoulder pain, initial encounter EXAM: LEFT SHOULDER - 2+ VIEW COMPARISON:  None. FINDINGS: Mild degenerative changes of the acromioclavicular joint are noted. No acute fracture or dislocation is noted. No soft tissue abnormality is noted IMPRESSION: No acute abnormality seen. Electronically Signed   By: Inez Catalina M.D.   On: 06/23/2020 08:51   DG Knee Complete 4 Views Left  Result Date: 06/23/2020 CLINICAL DATA:  Recent fall with knee pain, initial encounter EXAM: LEFT KNEE - COMPLETE 4+ VIEW COMPARISON:  None. FINDINGS: No evidence of fracture, dislocation, or joint effusion. No evidence of arthropathy or other focal bone abnormality. Soft tissues are unremarkable. IMPRESSION: No acute abnormality noted. Electronically Signed   By: Inez Catalina M.D.   On: 06/23/2020 08:52   DG Foot Complete Left  Result Date: 06/23/2020 CLINICAL DATA:  Recent fall with foot pain, initial encounter EXAM: LEFT FOOT - COMPLETE 3+ VIEW COMPARISON:  None. FINDINGS: No acute fracture or dislocation is noted. Calcaneal spurring is seen. No soft tissue abnormality is noted. IMPRESSION: No acute abnormality noted. Electronically Signed   By: Inez Catalina M.D.   On: 06/23/2020 08:56      Subjective: Patient seen and examined bedside, resting comfortably. Eating breakfast. Ready for discharge home. Discussed with her once again need for complete alcohol cessation. Recommend outpatient rehab services, she declined on social work interview during her hospitalization. We will continue antibiotics for aspiration pneumonia for 7-day course. No other  complaints or concerns at this time. Denies headache, no visual changes, no chest pain, palpitations, no shortness of breath, no abdominal pain, no weakness, no fatigue, no paresthesias.  No acute events overnight per nursing staff.  Discharge Exam: Vitals:   06/27/20 0540 06/27/20 0851  BP: 91/61 (!) 96/53  Pulse: 78 79  Resp: 18   Temp: 98.2 F (36.8 C)   SpO2: 92%    Vitals:   06/27/20 0059 06/27/20 0233 06/27/20 0540 06/27/20 0851  BP: (!) 87/55 (!) 86/67 91/61 (!) 96/53  Pulse: 92  78 79  Resp: 18  18   Temp: 98.3 F (36.8 C)  98.2 F (36.8 C)   TempSrc: Oral  Oral   SpO2: 93%  92%   Weight:      Height:        General: Pt is alert, awake, not in acute distress Cardiovascular: RRR, S1/S2 +, no rubs, no gallops Respiratory: CTA bilaterally, no wheezing, no rhonchi Abdominal: Soft, NT, ND, bowel sounds + Extremities: no edema, no cyanosis    The results of significant diagnostics from this hospitalization (including imaging, microbiology, ancillary and laboratory) are listed below for reference.     Microbiology: Recent Results (from the past 240 hour(s))  SARS Coronavirus 2 by RT PCR (hospital order, performed in [facility] hospital lab) Nasopharyngeal Nasopharyngeal Swab     Status: None   Collection Time: 06/23/20 10:54 AM   Specimen: Nasopharyngeal Swab  Result Value Ref Range Status   SARS Coronavirus 2 NEGATIVE NEGATIVE Final    Comment: (NOTE) SARS-CoV-2 target nucleic acids are NOT DETECTED.  The SARS-CoV-2 RNA is generally detectable in upper and lower respiratory specimens during the acute phase of infection. The lowest concentration of SARS-CoV-2 viral copies this assay can detect is 250 copies / mL. A negative result does not preclude SARS-CoV-2 infection and should not be used as the sole basis for treatment or other patient management decisions.  A negative result may occur with improper specimen collection / handling, submission of specimen other than nasopharyngeal swab, presence of viral mutation(s) within the areas targeted by this assay, and inadequate number of viral copies (<250 copies / mL). A negative result must be  combined with clinical observations, patient history, and epidemiological information.  Fact Sheet for Patients:   StrictlyIdeas.no  Fact Sheet for Healthcare Providers: BankingDealers.co.za  This test is not yet approved or  cleared by the [place] FDA and has been authorized for detection and/or diagnosis of SARS-CoV-2 by FDA under an Emergency Use Authorization (EUA).  This EUA will remain in effect (meaning this test can be used) for the duration of the COVID-19 declaration under Section 564(b)(1) of the Act, 21 U.S.C. section 360bbb-3(b)(1), unless the authorization is terminated or revoked sooner.  Performed at [facility], [place] [address] [place], [place] [address]      Labs: BNP (last 3 results) No results for input(s): BNP in the last 8760 hours. Basic Metabolic Panel: Recent Labs  Lab 06/23/20 0715 06/24/20 0433 06/25/20 0328 06/26/20 1002 06/27/20 0806  NA 143 142  --  137 136  K 3.4* 3.6  --  3.4* 3.6  CL 110 110  --  108 108  CO2 24 24  --  20* 18*  GLUCOSE 88 87  --  127* 84  BUN <5* <5*  --  6 8  CREATININE 0.49 0.46  --  0.63 0.51  CALCIUM 8.3* 7.5*  --  9.2 8.6*  MG  --   --  1.9  --  2.0   Liver Function Tests: Recent Labs  Lab 06/23/20 0715 06/24/20 0433  AST 56* 61*  ALT 23 24  ALKPHOS 69 57  BILITOT 1.0 1.0  PROT 7.3 6.7  ALBUMIN 3.4* 3.1*   No results for input(s): LIPASE, AMYLASE in the last 168 hours. Recent Labs  Lab 06/23/20 0716  AMMONIA 61*   CBC: Recent Labs  Lab 06/23/20 0715 06/24/20 0433 06/26/20 1002  WBC 3.9* 3.0* 3.0*  NEUTROABS  --   --  2.1  HGB 12.4 11.3* 12.2  HCT 39.0 35.9* 36.7  MCV 94.7 94.2 90.6  PLT 99* 79* 66*   Cardiac Enzymes: No results for input(s): CKTOTAL, CKMB, CKMBINDEX, TROPONINI in the last 168 hours. BNP: Invalid input(s): POCBNP CBG: Recent Labs  Lab 06/23/20 0643 06/23/20 1337  GLUCAP 90  90 86   D-Dimer No results  for input(s): DDIMER in the last 72 hours. Hgb A1c No results for input(s): HGBA1C in the last 72 hours. Lipid Profile No results for input(s): CHOL, HDL, LDLCALC, TRIG, CHOLHDL, LDLDIRECT in the last 72 hours. Thyroid function studies No results for input(s): TSH, T4TOTAL, T3FREE, THYROIDAB in the last 72 hours.  Invalid input(s): FREET3 Anemia work up No results for input(s): VITAMINB12, FOLATE, FERRITIN, TIBC, IRON, RETICCTPCT in the last 72 hours. Urinalysis    Component Value Date/Time   COLORURINE STRAW (A) 06/23/2020 0701   APPEARANCEUR CLEAR 06/23/2020 0701   LABSPEC 1.002 (L) 06/23/2020 0701   PHURINE 6.0 06/23/2020 0701   GLUCOSEU NEGATIVE 06/23/2020 0701   HGBUR NEGATIVE 06/23/2020 0701   BILIRUBINUR NEGATIVE 06/23/2020 0701   BILIRUBINUR Neg 05/08/2015 0919   KETONESUR NEGATIVE 06/23/2020 0701   PROTEINUR NEGATIVE 06/23/2020 0701   UROBILINOGEN 0.2 07/28/2015 1711   NITRITE NEGATIVE 06/23/2020 0701   LEUKOCYTESUR NEGATIVE 06/23/2020 0701   [facility] Invalid input(s): PROCALCITONIN,  WBC,  LACTICIDVEN Microbiology Recent Results (from the past 240 hour(s))  SARS Coronavirus 2 by RT PCR (hospital order, performed in [facility] lab) Nasopharyngeal Nasopharyngeal Swab     Status: None   Collection Time: 06/23/20 10:54 AM   Specimen: Nasopharyngeal Swab  Result Value Ref Range Status   SARS Coronavirus 2 NEGATIVE NEGATIVE Final    Comment: (NOTE) SARS-CoV-2 target nucleic acids are NOT DETECTED.  The SARS-CoV-2 RNA is generally detectable in upper and lower respiratory specimens during the acute phase of infection. The lowest concentration of SARS-CoV-2 viral copies this assay can detect is 250 copies / mL. A negative result does not preclude SARS-CoV-2 infection and should not be used as the sole basis for treatment or other patient management decisions.  A negative result may occur with improper specimen collection / handling, submission of specimen  other than nasopharyngeal swab, presence of viral mutation(s) within the areas targeted by this assay, and inadequate number of viral copies (<250 copies / mL). A negative result must be combined with clinical observations, patient history, and epidemiological information.  Fact Sheet for Patients:   StrictlyIdeas.no  Fact Sheet for Healthcare Providers: BankingDealers.co.za  This test is not yet approved or  cleared by the [place] FDA and has been authorized for detection and/or diagnosis of SARS-CoV-2 by FDA under an Emergency Use Authorization (EUA).  This EUA will remain in effect (meaning this test can be used) for the duration of the COVID-19 declaration under Section 564(b)(1) of the Act, 21 U.S.C. section 360bbb-3(b)(1), unless the authorization is terminated or revoked sooner.  Performed at [facility]  [facility], [place] [address] [place], [place] [address]      Time coordinating discharge: Over 30 minutes  SIGNED:   Kannon Granderson J [place] DO  [facility] 06/27/2020, 9:20 AM

## 2020-06-27 NOTE — Progress Notes (Addendum)
NT removed IV, catheter intact and telemetry removed, CCMD aware Pt discharge education provided at bedside  Pt has all belongings including printed prescription Pt discharged via wheelchair with RN

## 2020-06-27 NOTE — Discharge Instructions (Signed)
Cirrhosis  Cirrhosis is long-term (chronic) liver injury. The liver is the body's largest internal organ, and it performs many functions. It converts food into energy, removes toxic material from the blood, makes important proteins, and absorbs necessary vitamins from food. In cirrhosis, healthy liver cells are replaced by scar tissue. This prevents blood from flowing through the liver, making it difficult for the liver to function. Scarring of the liver cannot be reversed, but treatment can prevent it from getting worse. What are the causes? Common causes of this condition are hepatitis C and long-term alcohol abuse. Other causes include:  Nonalcoholic fatty liver disease. This happens when fat is deposited in the liver by causes other than alcohol.  Hepatitis B infection.  Autoimmune hepatitis. In this condition, the body's defense system (immune system) mistakenly attacks the liver cells, causing irritation and swelling (inflammation).  Diseases that cause blockage of ducts inside the liver.  Inherited liver diseases, such as hemochromatosis. This is one of the most common inherited liver diseases. In this disease, deposits of iron collect in the liver and other organs.  Reactions to certain long-term medicines, such as amiodarone, a heart medicine.  Parasitic infections. These include schistosomiasis, which is caused by a flatworm.  Long-term contact to certain toxins. These toxins include certain organic solvents, such as toluene and chloroform. What increases the risk? You are more likely to develop this condition if:  You have certain types of viral hepatitis.  You abuse alcohol, especially if you are female.  You are overweight.  You share needles.  You have unprotected sex with someone who has viral hepatitis. What are the signs or symptoms? You may not have any signs and symptoms at first. Symptoms may not develop until the damage to your liver starts to get worse. Early  symptoms may include:  Weakness and tiredness (fatigue).  Changes in sleep patterns or having trouble sleeping.  Itchiness.  Tenderness in the right-upper part of your abdomen.  Weight loss and muscle loss.  Nausea.  Loss of appetite.  Appearance of tiny blood vessels under the skin. Later symptoms may include:  Fatigue or weakness that is getting worse.  Yellow skin and eyes (jaundice).  Buildup of fluid in the abdomen (ascites). You may notice that your clothes are tight around your waist.  Weight gain.  Swelling of the feet and ankles (edema).  Trouble breathing.  Easy bruising and bleeding.  Vomiting blood.  Black or bloody stool.  Mental confusion. How is this diagnosed? Your health care provider may suspect cirrhosis based on your symptoms and medical history, especially if you have other medical conditions or a history of alcohol abuse. Your health care provider will do a physical exam to feel your liver and to check for signs of cirrhosis. He or she may perform other tests, including:  Blood tests to check: ? For hepatitis B or C. ? Kidney function. ? Liver function.  Imaging tests such as: ? MRI or CT scan to look for changes seen in advanced cirrhosis. ? Ultrasound to see if normal liver tissue is being replaced by scar tissue.  A procedure in which a long needle is used to take a sample of liver tissue to be checked in a lab (biopsy). Liver biopsy can confirm the diagnosis of cirrhosis. How is this treated? Treatment for this condition depends on how damaged your liver is and what caused the damage. It may include treating the symptoms of cirrhosis, or treating the underlying causes in order to   slow the damage. Treatment may include:  Making lifestyle changes, such as: ? Eating a healthy diet. You may need to work with your health care provider or a diet and nutrition specialist (dietitian) to develop an eating plan. ? Restricting salt  intake. ? Maintaining a healthy weight. ? Not abusing drugs or alcohol.  Taking medicines to: ? Treat liver infections or other infections. ? Control itching. ? Reduce fluid buildup. ? Reduce certain blood toxins. ? Reduce risk of bleeding from enlarged blood vessels in the stomach or esophagus (varices).  Liver transplant. In this procedure, a liver from a donor is used to replace your diseased liver. This is done if cirrhosis has caused liver failure. Other treatments and procedures may be done depending on the problems that you get from cirrhosis. Common problems include liver-related kidney failure (hepatorenal syndrome). Follow these instructions at home:   Take medicines only as told by your health care provider. Do not use medicines that are toxic to your liver. Ask your health care provider before taking any new medicines, including over-the-counter medicines.  Rest as needed.  Eat a well-balanced diet. Ask your health care provider or dietitian for more information.  Limit your salt or water intake, if your health care provider asks you to do this.  Do not drink alcohol. This is especially important if you are taking acetaminophen.  Keep all follow-up visits as told by your health care provider. This is important. Contact a health care provider if you:  Have fatigue or weakness that is getting worse.  Develop swelling of the hands, feet, legs, or face.  Have a fever.  Develop loss of appetite.  Have nausea or vomiting.  Develop jaundice.  Develop easy bruising or bleeding. Get help right away if you:  Vomit bright red blood or a material that looks like coffee grounds.  Have blood in your stools.  Notice that your stools appear black and tarry.  Become confused.  Have chest pain or trouble breathing. Summary  Cirrhosis is chronic liver injury. Liver damage cannot be reversed. Common causes are hepatitis C and long-term alcohol abuse.  Tests used to  diagnose cirrhosis include blood tests, imaging tests, and liver biopsy.  Treatment for this condition involves treating the underlying cause. Avoid alcohol, drugs, salt, and medicines that may damage your liver.  Contact your health care provider if you develop ascites, edema, jaundice, fever, nausea or vomiting, easy bruising or bleeding, or worsening fatigue. This information is not intended to replace advice given to you by your health care provider. Make sure you discuss any questions you have with your health care provider. Document Revised: 02/27/2019 Document Reviewed: 09/27/2017 Elsevier Patient Education  [place]. Alcohol Intoxication Alcohol intoxication occurs when a person no longer thinks clearly or functions well (becomes impaired) after drinking alcohol. Intoxication can occur with just one drink. The legal definition of alcohol intoxication depends on the amount of alcohol in the blood (blood alcohol concentration, BAC). BAC of 80-100 mg/dL or higher is commonly considered legally intoxicated. The level of impairment depends on:  The amount of alcohol the person had.  The person's age, gender, and weight.  How often the person drinks.  Whether the person has other medical conditions, such as diabetes, seizures, or a heart condition. Alcohol intoxication can range from mild to severe. The condition can be dangerous, especially if the person:  Also took certain drugs or prescription medicines.  Drinks a large amount of alcohol in a short period  of time (binge drinks). ? For women, binge drinking is having four or more drinks at one time. ? For men, binge drinking is having five or more drinks at one time. If you or anyone around you appears intoxicated, speak up and act. What are the causes? This condition is caused by drinking alcohol. What increases the risk? The following factors may make you more likely to develop this condition:  Peer pressure in young  adults.  Difficulty managing stress.  History of drug or alcohol abuse.  Combining alcohol with drugs.  Family history of drug or alcohol abuse.  Low body weight.  Binge drinking. What are the signs or symptoms? Symptoms of alcohol intoxication can vary from person to person. Symptoms can be mild, moderate, or severe. Symptoms of mild alcohol intoxication may include:  Feeling relaxed or sleepy.  Having mild difficulty with coordination, speech, memory, or attention. Symptoms of moderate alcohol intoxication may include:  Extreme emotions, like anger or sadness.  Moderate difficulty with coordination, speech, memory, or attention. Symptoms of severe alcohol intoxication may include:  Severe difficulty with coordination, speech, memory, or attention.  Passing out.  Vomiting.  Confusion.  Slow breathing.  Coma. Intoxication can change quickly from mild to severe. It can cause coma or death, especially in people who are not exposed to alcohol often. How is this diagnosed? Your health care provider will ask you how much alcohol you drank and what kind you had. Intoxication may also be diagnosed based on:  Your symptoms and medical history.  A physical exam.  A blood test that measures BAC.  A smell of alcohol on your breath. How is this treated? Treatment for alcohol intoxication may include:  Being monitored in an emergency department, hospital, or treatment center until your [facility] comes down and it is safe for you to go home.  IV fluids to prevent or treat loss of fluid in the body (dehydration).  Medicine to treat nausea or vomiting or to get rid of alcohol in the body.  Counseling (brief intervention) about the dangers of using alcohol.  Treatment for substance use disorder.  Oxygen therapy or a breathing machine (ventilator). Long-term (chronic) exposure to alcohol can have long-term effects on your brain, heart, and gastrointestinal system. These effects  can be serious and may also require treatment. Follow these instructions at home:  Eating and drinking   Do not drink alcohol if: ? Your health care provider tells you not to drink. ? You are pregnant, may be pregnant, or are planning to become pregnant. ? You are under the legal drinking age (43 years old in the [place] ? You are taking medicines that should not be taken with alcohol. ? You have a medical condition, and alcohol makes it worse. ? You need to drive or perform activities that require you to be alert. ? You have substance use disorder.  Ask your health care provider if alcohol is safe for you. If your health care provider allows you to drink alcohol, limit how much you have. You may drink: ? 0-1 drink a day for women. ? 0-2 drinks a day for men.  Be aware of how much alcohol is in your drink. In the [place] one drink equals one 12 oz bottle of beer (355 mL), one 5 oz glass of wine (148 mL), or one 1 oz shot of hard liquor (44 mL).  Avoid drinking alcohol on an empty stomach.  Stay hydrated. Drink enough fluid to keep your urine pale  yellow. Avoid caffeine because it can dehydrate you.  Avoid drinking more than one drink per hour.  When having multiple drinks, drink water or a non-alcoholic beverage between alcoholic drinks. General instructions  Take over-the-counter and prescription medicines only as told by your health care provider.  Do not drive after drinking any amount of alcohol. Plan for a designated driver or another way to go home.  Have someone responsible stay with you while you are intoxicated. You should not be left alone.  Keep all follow-up visits as told by your health care provider. This is important. Contact a health care provider if:  You do not feel better after a few days.  You have problems at work, at school, or at home due to drinking. Get help right away if:  You have any of the following: ? Moderate to severe trouble with  coordination, speech, memory, or attention. ? Trouble staying awake. ? Severe confusion. ? A seizure. ? Light-headedness. ? Fainting. ? Vomiting bright red blood or material that looks like coffee grounds. ? Bloody stool (feces). The blood may make your stool bright red, black, or tarry. It may also smell bad. ? Shakiness when trying to stop drinking. ? Thoughts about hurting yourself or others. If you ever feel like you may hurt yourself or others, or have thoughts about taking your own life, get help right away. You can go to your nearest emergency department or call:  Your local emergency services (911 in the [place]  A suicide crisis helpline, such as the [facility] at (906) 349-7707. This is open 24 hours a day. Summary  Alcohol intoxication occurs when a person no longer thinks clearly or functions well after drinking alcohol.  If your health care provider says that alcohol is safe for you, limit alcohol intake to no more than 1 drink a day for women (no drinks if you are pregnant) and 2 drinks a day for men. One drink equals 12 oz of beer, 5 oz of wine, or 1 oz of hard liquor.  Contact your health care provider if drinking has caused you problems at work, school, or home.  Get help right away if you have thoughts about hurting yourself or others. This information is not intended to replace advice given to you by your health care provider. Make sure you discuss any questions you have with your health care provider. Document Revised: 02/27/2018 Document Reviewed: 02/27/2018 Elsevier Patient Education  [place]. Aspiration Pneumonia Aspiration pneumonia is an infection in the lungs. It occurs when saliva or liquid contaminated with bacteria is inhaled (aspirated) into the lungs. When these things get into the lungs, swelling (inflammation) and infection can occur. This can make it difficult to breathe. Aspiration pneumonia is a serious condition and  can be life threatening. What are the causes? This condition is caused when saliva or liquid from the mouth, throat, or stomach is inhaled into the lungs, and when those fluids are contaminated with bacteria. What increases the risk? The following factors may make you more likely to develop this condition:  A narrowing of the tube that carries food to the stomach (esophageal narrowing).  Having gastroesophageal reflux disease (GERD).  Having a weak immune system.  Having diabetes.  Having poor oral hygiene.  Being malnourished. The condition is more likely to occur when a person's cough (gag) reflex, or ability to swallow, has decreased. Some things that can cause this decrease include:  Having a brain injury or disease, such  as stroke, seizures, Parkinson disease, dementia, or amyotrophic lateral sclerosis (ALS).  Being given a general anesthetic for procedures.  Drinking too much alcohol. If a person passes out and vomits, vomit can be inhaled into the lungs.  Taking certain medicines, such as tranquilizers or sedatives. What are the signs or symptoms? Symptoms of this condition include:  Fever.  A cough with secretions that are yellow, tan, or green.  Breathing problems, such as wheezing or shortness of breath.  Chest pain.  Being more tired than usual (fatigue).  Having a history of coughing while eating or drinking.  Bad breath.  Bluish color to the lips, skin, or fingers. How is this diagnosed? This condition may be diagnosed based on:  A physical exam.  Tests, such as: ? Chest X-ray. ? Sputum culture. Saliva and mucus (sputum) are collected from the lungs or the tubes that carry air to the lungs (bronchi). The sputum is then tested for bacteria. ? Oximetry. A sensor or clip is placed on areas such as a finger, earlobe, or toe to measure the oxygen level in your blood. ? Blood tests. ? Swallowing study. This test looks at how food is swallowed and whether  it goes into your breathing tube (trachea) or esophagus. ? Bronchoscopy. This test uses a flexible tube (bronchoscope) to see inside the lungs. How is this treated? This condition may be treated with:  Medicines. Antibiotic medicine will be given to kill the pneumonia bacteria. Other medicines may also be used to reduce fever or pain.  Breathing assistance and oxygen therapy. Depending on how well you are breathing, you may need to be given oxygen, or you may need breathing support from a breathing machine (ventilator).  Thoracentesis. This is a procedure to remove fluid that has built up in the space between the linings of the chest wall and the lungs.  Feeding tube and diet change. For people who have difficulty swallowing, a feeding tube might be placed in the stomach, or they may be asked to avoid certain food textures or liquids when eating. Follow these instructions at home: Medicines  Take over-the-counter and prescription medicines only as told by your health care provider. ? If you were prescribed an antibiotic medicine, take it as told by your health care provider. Do not stop taking the antibiotic even if you start to feel better. ? Take cough medicine only if you are losing sleep. Cough medicine can prevent your body's natural ability to remove mucus from your lungs. General instructions  Carefully follow any eating instructions you were given, such as avoiding certain food textures or thickening your liquids. Thickening liquids reduces the risk of developing aspiration pneumonia again.  Use breathing exercises such as postural drainage, deep breathing, and incentive spirometry to help expel secretions.  Rest as instructed by your health care provider.  Sleep in a semi-upright position at night. Try to sleep in a reclining chair, or place a few pillows under your head.  Do not use any products that contain nicotine or tobacco, such as cigarettes and e-cigarettes. If you need  help quitting, ask your health care provider.  Keep all follow-up visits as told by your health care provider. This is important. Contact a health care provider if:  You have a fever.  You have a worsening cough with yellow, tan, or green secretions.  You have coughing while eating or drinking. Get help right away if:  You have worsening shortness of breath, wheezing, or difficulty breathing.  You have  chest pain. Summary  Aspiration pneumonia is an infection in the lungs. It is caused when saliva or liquid from the mouth, throat, or stomach is inhaled into the lungs.  Aspiration pneumonia is more likely to occur when a person's cough reflex or ability to swallow has decreased.  Symptoms of aspiration pneumonia include coughing, breathing problems, fever, and chest pain.  Aspiration pneumonia may be treated with antibiotic medicine, other medicines to reduce pain or fever, and breathing assistance or oxygen therapy. This information is not intended to replace advice given to you by your health care provider. Make sure you discuss any questions you have with your health care provider. Document Revised: 10/20/2017 Document Reviewed: 12/13/2016 Elsevier Patient Education  2020 [employer].

## 2020-07-04 ENCOUNTER — Emergency Department (HOSPITAL_COMMUNITY)
Admission: EM | Admit: 2020-07-04 | Discharge: 2020-07-05 | Disposition: A | Discharge status: Home / Self Care | Payer: Self-pay | Attending: Emergency Medicine | Admitting: Emergency Medicine

## 2020-07-04 ENCOUNTER — Encounter (HOSPITAL_COMMUNITY): Payer: Self-pay | Admitting: Emergency Medicine

## 2020-07-04 ENCOUNTER — Emergency Department (HOSPITAL_COMMUNITY): Payer: Self-pay

## 2020-07-04 DIAGNOSIS — F10229 Alcohol dependence with intoxication, unspecified: Secondary | ICD-10-CM | POA: Insufficient documentation

## 2020-07-04 DIAGNOSIS — Z79899 Other long term (current) drug therapy: Secondary | ICD-10-CM | POA: Insufficient documentation

## 2020-07-04 DIAGNOSIS — Z87891 Personal history of nicotine dependence: Secondary | ICD-10-CM | POA: Insufficient documentation

## 2020-07-04 DIAGNOSIS — Y929 Unspecified place or not applicable: Secondary | ICD-10-CM | POA: Insufficient documentation

## 2020-07-04 DIAGNOSIS — S0083XA Contusion of other part of head, initial encounter: Secondary | ICD-10-CM | POA: Insufficient documentation

## 2020-07-04 DIAGNOSIS — Y939 Activity, unspecified: Secondary | ICD-10-CM | POA: Insufficient documentation

## 2020-07-04 DIAGNOSIS — X58XXXA Exposure to other specified factors, initial encounter: Secondary | ICD-10-CM | POA: Insufficient documentation

## 2020-07-04 DIAGNOSIS — Y999 Unspecified external cause status: Secondary | ICD-10-CM | POA: Insufficient documentation

## 2020-07-04 NOTE — ED Triage Notes (Signed)
BIB EMS from home. ETOH on board. Husband found patient at home intoxicated with hematoma to head. Assumed patient fell while he was gone. History of same.

## 2020-07-05 MED ORDER — CHLORDIAZEPOXIDE HCL 25 MG PO CAPS: ORAL_CAPSULE | ORAL | 0 refills | Status: DC

## 2020-07-05 MED ORDER — CHLORDIAZEPOXIDE HCL 25 MG PO CAPS
ORAL_CAPSULE | ORAL | 0 refills | Status: DC
Start: 2020-07-05 — End: 2020-07-05

## 2020-07-05 MED ORDER — ACETAMINOPHEN 325 MG PO TABS: 650.0000 mg | ORAL_TABLET | Freq: Once | ORAL | Status: DC

## 2020-07-05 NOTE — Discharge Instructions (Signed)
You can use tylenol and ice packs for your forehead bruise.  No internal bleeding seen on CAT scan
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
## 2020-07-14 ENCOUNTER — Ambulatory Visit: Payer: Medicaid Other

## 2020-07-27 ENCOUNTER — Emergency Department (HOSPITAL_COMMUNITY): Payer: Self-pay

## 2020-07-27 ENCOUNTER — Other Ambulatory Visit: Payer: Self-pay

## 2020-07-27 ENCOUNTER — Encounter (HOSPITAL_COMMUNITY): Payer: Self-pay | Admitting: Emergency Medicine

## 2020-07-27 ENCOUNTER — Emergency Department (HOSPITAL_COMMUNITY)
Admission: EM | Admit: 2020-07-27 | Discharge: 2020-07-27 | Disposition: A | Discharge status: Home / Self Care | Payer: Self-pay | Attending: Emergency Medicine | Admitting: Emergency Medicine

## 2020-07-27 DIAGNOSIS — K7460 Unspecified cirrhosis of liver: Secondary | ICD-10-CM

## 2020-07-27 DIAGNOSIS — I10 Essential (primary) hypertension: Secondary | ICD-10-CM | POA: Insufficient documentation

## 2020-07-27 DIAGNOSIS — Y905 Blood alcohol level of 100-119 mg/100 ml: Secondary | ICD-10-CM | POA: Insufficient documentation

## 2020-07-27 DIAGNOSIS — Z20822 Contact with and (suspected) exposure to covid-19: Secondary | ICD-10-CM | POA: Insufficient documentation

## 2020-07-27 DIAGNOSIS — D61818 Other pancytopenia: Secondary | ICD-10-CM | POA: Insufficient documentation

## 2020-07-27 DIAGNOSIS — F10929 Alcohol use, unspecified with intoxication, unspecified: Secondary | ICD-10-CM

## 2020-07-27 DIAGNOSIS — Z87891 Personal history of nicotine dependence: Secondary | ICD-10-CM | POA: Insufficient documentation

## 2020-07-27 DIAGNOSIS — F10129 Alcohol abuse with intoxication, unspecified: Secondary | ICD-10-CM | POA: Insufficient documentation

## 2020-07-27 DIAGNOSIS — F1020 Alcohol dependence, uncomplicated: Secondary | ICD-10-CM

## 2020-07-27 DIAGNOSIS — K7030 Alcoholic cirrhosis of liver without ascites: Secondary | ICD-10-CM | POA: Insufficient documentation

## 2020-07-27 LAB — SARS CORONAVIRUS 2 BY RT PCR (HOSPITAL ORDER, PERFORMED IN ~~LOC~~ HOSPITAL LAB): SARS Coronavirus 2: NEGATIVE

## 2020-07-27 LAB — CBC WITH DIFFERENTIAL/PLATELET
Abs Immature Granulocytes: 0 10*3/uL (ref 0.00–0.07)
Basophils Absolute: 0 10*3/uL (ref 0.0–0.1)
Basophils Relative: 1 %
Eosinophils Absolute: 0 10*3/uL (ref 0.0–0.5)
Eosinophils Relative: 1 %
HCT: 30.9 % — ABNORMAL LOW (ref 36.0–46.0)
Hemoglobin: 10 g/dL — ABNORMAL LOW (ref 12.0–15.0)
Immature Granulocytes: 0 %
Lymphocytes Relative: 58 %
Lymphs Abs: 0.7 10*3/uL (ref 0.7–4.0)
MCH: 29.7 pg (ref 26.0–34.0)
MCHC: 32.4 g/dL (ref 30.0–36.0)
MCV: 91.7 fL (ref 80.0–100.0)
Monocytes Absolute: 0.1 10*3/uL (ref 0.1–1.0)
Monocytes Relative: 11 %
Neutro Abs: 0.3 10*3/uL — ABNORMAL LOW (ref 1.7–7.7)
Neutrophils Relative %: 29 %
Platelets: 32 10*3/uL — ABNORMAL LOW (ref 150–400)
RBC: 3.37 MIL/uL — ABNORMAL LOW (ref 3.87–5.11)
RDW: 18.4 % — ABNORMAL HIGH (ref 11.5–15.5)
WBC: 1.1 10*3/uL — CL (ref 4.0–10.5)
nRBC: 0 % (ref 0.0–0.2)

## 2020-07-27 LAB — RAPID URINE DRUG SCREEN, HOSP PERFORMED
Amphetamines: NOT DETECTED
Barbiturates: NOT DETECTED
Benzodiazepines: NOT DETECTED
Cocaine: NOT DETECTED
Opiates: NOT DETECTED
Tetrahydrocannabinol: POSITIVE — AB

## 2020-07-27 LAB — COMPREHENSIVE METABOLIC PANEL
ALT: 31 U/L (ref 0–44)
AST: 80 U/L — ABNORMAL HIGH (ref 15–41)
Albumin: 3.3 g/dL — ABNORMAL LOW (ref 3.5–5.0)
Alkaline Phosphatase: 104 U/L (ref 38–126)
Anion gap: 10 (ref 5–15)
BUN: 6 mg/dL (ref 6–20)
CO2: 21 mmol/L — ABNORMAL LOW (ref 22–32)
Calcium: 8.3 mg/dL — ABNORMAL LOW (ref 8.9–10.3)
Chloride: 108 mmol/L (ref 98–111)
Creatinine, Ser: 0.42 mg/dL — ABNORMAL LOW (ref 0.44–1.00)
GFR calc Af Amer: >60 mL/min (ref >60)
GFR calc non Af Amer: >60 mL/min (ref >60)
Glucose, Bld: 98 mg/dL (ref 70–99)
Potassium: 3.7 mmol/L (ref 3.5–5.1)
Sodium: 139 mmol/L (ref 135–145)
Total Bilirubin: 1.2 mg/dL (ref 0.3–1.2)
Total Protein: 7.1 g/dL (ref 6.5–8.1)

## 2020-07-27 LAB — ETHANOL: Alcohol, Ethyl (B): 111 mg/dL — ABNORMAL HIGH (ref <10)

## 2020-07-27 MED ORDER — SODIUM CHLORIDE 0.9 % IV BOLUS
1000.0000 mL | Freq: Once | INTRAVENOUS | Status: AC
  Administered 2020-07-27: 1000 mL via INTRAVENOUS

## 2020-07-27 MED ORDER — LORAZEPAM 1 MG PO TABS
1.0000 mg | ORAL_TABLET | Freq: Once | ORAL | Status: AC
  Administered 2020-07-27: 1 mg via ORAL
  Filled 2020-07-27: qty 1
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
## 2020-07-27 NOTE — ED Triage Notes (Signed)
Patient states she has been drinking and complaining of right side face pain. Patient states her body feels cold.

## 2020-07-27 NOTE — Discharge Instructions (Signed)
Your alcoholism and heavy drinking has caused cirrhosis of the liver which is led to pancytopenia.  You are at risk for multiple problems including opportunistic infections, bleeding, weakness because of the pancytopenia.  Try to avoid all contacts with people who might be infected.  Return here if you have bleeding that cannot be controlled.  Make sure you follow-up with your new primary care provider next week for further management treatment.  Go to alcoholics anonymous and consider seeing a therapist to help you stop drinking.

## 2020-07-28 LAB — PATHOLOGIST SMEAR REVIEW

## 2020-08-04 ENCOUNTER — Inpatient Hospital Stay: Payer: Self-pay | Admitting: Family Medicine

## 2020-09-14 ENCOUNTER — Other Ambulatory Visit: Payer: Self-pay | Admitting: Family Medicine

## 2020-09-14 ENCOUNTER — Encounter: Payer: Self-pay | Admitting: Family Medicine

## 2020-09-14 ENCOUNTER — Other Ambulatory Visit: Payer: Self-pay

## 2020-09-14 ENCOUNTER — Ambulatory Visit: Payer: Self-pay | Attending: Family Medicine | Admitting: Family Medicine

## 2020-09-14 VITALS — BP 105/68 | HR 82 | Temp 97.3°F | Ht 63.5 in | Wt 167.8 lb

## 2020-09-14 DIAGNOSIS — K7030 Alcoholic cirrhosis of liver without ascites: Secondary | ICD-10-CM

## 2020-09-14 DIAGNOSIS — D500 Iron deficiency anemia secondary to blood loss (chronic): Secondary | ICD-10-CM

## 2020-09-14 DIAGNOSIS — D61818 Other pancytopenia: Secondary | ICD-10-CM

## 2020-09-14 DIAGNOSIS — Z7689 Persons encountering health services in other specified circumstances: Secondary | ICD-10-CM

## 2020-09-14 DIAGNOSIS — F1021 Alcohol dependence, in remission: Secondary | ICD-10-CM

## 2020-09-14 DIAGNOSIS — Z789 Other specified health status: Secondary | ICD-10-CM

## 2020-09-14 DIAGNOSIS — F411 Generalized anxiety disorder: Secondary | ICD-10-CM

## 2020-09-14 DIAGNOSIS — F319 Bipolar disorder, unspecified: Secondary | ICD-10-CM

## 2020-09-14 DIAGNOSIS — E876 Hypokalemia: Secondary | ICD-10-CM

## 2020-09-14 MED ORDER — TOPIRAMATE 50 MG PO TABS: 50.0000 mg | ORAL_TABLET | Freq: Two times a day (BID) | ORAL | 2 refills | Status: DC

## 2020-09-14 MED ORDER — PANTOPRAZOLE SODIUM 40 MG PO TBEC: 40.0000 mg | DELAYED_RELEASE_TABLET | Freq: Every day | ORAL | 2 refills | Status: DC

## 2020-09-14 MED ORDER — ESCITALOPRAM OXALATE 20 MG PO TABS: 20.0000 mg | ORAL_TABLET | Freq: Every day | ORAL | 3 refills | Status: DC

## 2020-09-14 MED ORDER — QUETIAPINE FUMARATE 100 MG PO TABS: 100.0000 mg | ORAL_TABLET | Freq: Every day | ORAL | 2 refills | Status: DC

## 2020-09-14 MED FILL — TOPIRAMATE 50 MG TABLET: 50 | 30 days supply | Qty: 60 | Fill #0

## 2020-09-14 MED FILL — QUETIAPINE FUMARATE 100 MG: 100 | 30 days supply | Qty: 30 | Fill #0

## 2020-09-14 MED FILL — PANTOPRAZOLE SOD DR 40 MG T: 40 | 30 days supply | Qty: 30 | Fill #0

## 2020-09-14 MED FILL — ESCITALOPRAM 20 MG TABLET: 20 | 30 days supply | Qty: 30 | Fill #0

## 2020-09-14 NOTE — Patient Instructions (Signed)
Alcoholic Liver Disease  Alcoholic liver disease is liver damage that is caused by drinking a lot of alcohol for a long time. If you have this disease, you must stop drinking alcohol. Follow these instructions at home:   Do not drink alcohol. Follow your treatment plan. Work with your doctor if you need help.  Think about joining an alcohol support group.  Take over-the-counter and prescription medicines only as told by your doctor. These include vitamins.  Do not use medicines or eat foods that have alcohol in them, unless your doctor says that it is safe.  Follow instructions from your doctor about eating a healthy diet.  Keep all follow-up visits as told by your doctor. This is important. Contact a doctor if:  You get a fever.  Your skin starts to look more yellow, pale, or dark.  You get headaches. Get help right away if:  You throw up (vomit) blood.  You have bright red blood in your poop (stool).  Your poop looks black or looks like tar.  You have trouble: ? Thinking. ? Walking. ? Balancing. ? Breathing. Summary  Alcoholic liver disease is liver damage that is caused by drinking a lot of alcohol for a long time.  If you have this disease, you must stop drinking alcohol. Follow your treatment plan, and work with your doctor as needed.  Think about joining an alcohol support group. This information is not intended to replace advice given to you by your health care provider. Make sure you discuss any questions you have with your health care provider. Document Revised: 02/26/2019 Document Reviewed: 07/28/2017 Elsevier Patient Education  [employer].  Generalized Anxiety Disorder, Adult Generalized anxiety disorder (GAD) is a mental health disorder. People with this condition constantly worry about everyday events. Unlike normal anxiety, worry related to GAD is not triggered by a specific event. These worries also do not fade or get better with time. GAD  interferes with life functions, including relationships, work, and school. GAD can vary from mild to severe. People with severe GAD can have intense waves of anxiety with physical symptoms (panic attacks). What are the causes? The exact cause of GAD is not known. What increases the risk? This condition is more likely to develop in:  Women.  People who have a family history of anxiety disorders.  People who are very shy.  People who experience very stressful life events, such as the death of a loved one.  People who have a very stressful family environment. What are the signs or symptoms? People with GAD often worry excessively about many things in their lives, such as their health and family. They may also be overly concerned about:  Doing well at work.  Being on time.  Natural disasters.  Friendships. Physical symptoms of GAD include:  Fatigue.  Muscle tension or having muscle twitches.  Trembling or feeling shaky.  Being easily startled.  Feeling like your heart is pounding or racing.  Feeling out of breath or like you cannot take a deep breath.  Having trouble falling asleep or staying asleep.  Sweating.  Nausea, diarrhea, or irritable bowel syndrome (IBS).  Headaches.  Trouble concentrating or remembering facts.  Restlessness.  Irritability. How is this diagnosed? Your health care provider can diagnose GAD based on your symptoms and medical history. You will also have a physical exam. The health care provider will ask specific questions about your symptoms, including how severe they are, when they started, and if they come and  go. Your health care provider may ask you about your use of alcohol or drugs, including prescription medicines. Your health care provider may refer you to a mental health specialist for further evaluation. Your health care provider will do a thorough examination and may perform additional tests to rule out other possible causes of your  symptoms. To be diagnosed with GAD, a person must have anxiety that:  Is out of his or her control.  Affects several different aspects of his or her life, such as work and relationships.  Causes distress that makes him or her unable to take part in normal activities.  Includes at least three physical symptoms of GAD, such as restlessness, fatigue, trouble concentrating, irritability, muscle tension, or sleep problems. Before your health care provider can confirm a diagnosis of GAD, these symptoms must be present more days than they are not, and they must last for six months or longer. How is this treated? The following therapies are usually used to treat GAD:  Medicine. Antidepressant medicine is usually prescribed for long-term daily control. Antianxiety medicines may be added in severe cases, especially when panic attacks occur.  Talk therapy (psychotherapy). Certain types of talk therapy can be helpful in treating GAD by providing support, education, and guidance. Options include: ? Cognitive behavioral therapy (CBT). People learn coping skills and techniques to ease their anxiety. They learn to identify unrealistic or negative thoughts and behaviors and to replace them with positive ones. ? Acceptance and commitment therapy (ACT). This treatment teaches people how to be mindful as a way to cope with unwanted thoughts and feelings. ? Biofeedback. This process trains you to manage your body's response (physiological response) through breathing techniques and relaxation methods. You will work with a therapist while machines are used to monitor your physical symptoms.  Stress management techniques. These include yoga, meditation, and exercise. A mental health specialist can help determine which treatment is best for you. Some people see improvement with one type of therapy. However, other people require a combination of therapies. Follow these instructions at home:  Take over-the-counter and  prescription medicines only as told by your health care provider.  Try to maintain a normal routine.  Try to anticipate stressful situations and allow extra time to manage them.  Practice any stress management or self-calming techniques as taught by your health care provider.  Do not punish yourself for setbacks or for not making progress.  Try to recognize your accomplishments, even if they are small.  Keep all follow-up visits as told by your health care provider. This is important. Contact a health care provider if:  Your symptoms do not get better.  Your symptoms get worse.  You have signs of depression, such as: ? A persistently sad, cranky, or irritable mood. ? Loss of enjoyment in activities that used to bring you joy. ? Change in weight or eating. ? Changes in sleeping habits. ? Avoiding friends or family members. ? Loss of energy for normal tasks. ? Feelings of guilt or worthlessness. Get help right away if:  You have serious thoughts about hurting yourself or others. If you ever feel like you may hurt yourself or others, or have thoughts about taking your own life, get help right away. You can go to your nearest emergency department or call:  Your local emergency services (911 in the [place]  A suicide crisis helpline, such as the [place] at 8327285701. This is open 24 hours a day. Summary  Generalized anxiety disorder (  GAD) is a mental health disorder that involves worry that is not triggered by a specific event.  People with GAD often worry excessively about many things in their lives, such as their health and family.  GAD may cause physical symptoms such as restlessness, trouble concentrating, sleep problems, frequent sweating, nausea, diarrhea, headaches, and trembling or muscle twitching.  A mental health specialist can help determine which treatment is best for you. Some people see improvement with one type of therapy.  However, other people require a combination of therapies. This information is not intended to replace advice given to you by your health care provider. Make sure you discuss any questions you have with your health care provider. Document Revised: 10/20/2017 Document Reviewed: 09/27/2016 Elsevier Patient Education  2020 [employer].

## 2020-09-14 NOTE — Progress Notes (Signed)
New Patient Office Visit  Subjective:  Patient ID: Sara [name], female    DOB: 02-23-1976  Age: 44 y.o. MRN: [id number]  CC:  Chief Complaint  Patient presents with  . Establish Care    HPI Sara [name], 44 year old female, who presents to establish care prior to her upcoming discharge from an inpatient alcohol rehabilitation program.  Patient is status post emergency department visit on 07/27/2020 after patient had noticed some unusual bruising as well as fatigue and patient reported a history of low platelets as well as anemia.  She was found to have pancytopenia at that time with white blood cell count of 1.1, hemoglobin of 10.0 and platelet count of 32.  She continues to have some fatigue but no unusual bruising or bleeding at this time.  She has abstained from alcohol during her treatment program.  She will need medication for continued treatment of bipolar disorder and generalized anxiety disorder.  She reports that she is currently on Lexapro 10 mg within the past she was on a 20 mg dose of Lexapro and felt that this dose was more helpful for controlling her anxiety as she feels that she will likely have increased anxiety after the completion of her inpatient rehab program.  Past Medical History:  Diagnosis Date  . Alcohol abuse   . Alcoholism ([place])   . Anemia   . Anxiety   . Blood transfusion without reported diagnosis   . Cirrhosis ([place])   . Depression   . 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 GERD (gastroesophageal reflux disease)   . Heart murmur    Patient states she may have  . Menorrhagia   . Pancytopenia ([place]) 01/15/2014  . Pneumonia   . Portal hypertension ([place])   . S/P alcohol detoxification    2-3 days at behavioral health previously  . UGI bleed 06/12/2014    Past Surgical History:  Procedure Laterality Date  . CHOLECYSTECTOMY    . ESOPHAGOGASTRODUODENOSCOPY N/A 06/12/2014   Procedure: ESOPHAGOGASTRODUODENOSCOPY (EGD);   Surgeon: Gatha Mayer, MD;  Location: [facility];  Service: Endoscopy;  Laterality: N/A;  . ESOPHAGOGASTRODUODENOSCOPY (EGD) WITH PROPOFOL N/A 07/29/2014   Procedure: ESOPHAGOGASTRODUODENOSCOPY (EGD) WITH PROPOFOL;  Surgeon: Inda Castle, MD;  Location: [facility];  Service: Endoscopy;  Laterality: N/A;  . ESOPHAGOGASTRODUODENOSCOPY (EGD) WITH PROPOFOL N/A 01/20/2018   Procedure: ESOPHAGOGASTRODUODENOSCOPY (EGD) WITH PROPOFOL;  Surgeon: Mauri Pole, MD;  Location: [facility];  Service: Endoscopy;  Laterality: N/A;    Family History  Problem Relation Age of Onset  . Colon polyps Mother   . Hypertension Mother   . Thyroid disease Mother   . Alcoholism Mother   . Alcoholism Father   . Alcohol abuse Maternal Grandfather   . Alcohol abuse Paternal Grandfather   . Alcohol abuse Paternal Aunt   . Alcohol abuse Maternal Uncle     Social History   Socioeconomic History  . Marital status: Married    Spouse name: [name]  . Number of children: 2  . Years of education: Associates  . Highest education level: Not on file  Occupational History  . Occupation: Paralegal  Tobacco Use  . Smoking status: Former Smoker    Packs/day: 0.25    Types: Cigarettes    Quit date: 11/22/2018    Years since quitting: 1.8  . Smokeless tobacco: Never Used  Substance and Sexual Activity  . Alcohol use: Yes    Alcohol/week: 0.0 standard drinks    Comment: Usually drinks 2-3  bottles of wine daily when drinking.   . Drug use: No    Types: Cocaine, Marijuana    Comment: denies  . Sexual activity: Never    Birth control/protection: None  Other Topics Concern  . Not on file  Social History Narrative   Lives with husband and son. Daughter lives nearby. 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   Social Determinants of Health   Financial Resource Strain:   . Difficulty of Paying Living Expenses: Not on file  Food Insecurity:   . Worried About Charity fundraiser in the  Last Year: Not on file  . Ran Out of Food in the Last Year: Not on file  Transportation Needs:   . Lack of Transportation (Medical): Not on file  . Lack of Transportation (Non-Medical): Not on file  Physical Activity:   . Days of Exercise per Week: Not on file  . Minutes of Exercise per Session: Not on file  Stress:   . Feeling of Stress : Not on file  Social Connections:   . Frequency of Communication with Friends and Family: Not on file  . Frequency of Social Gatherings with Friends and Family: Not on file  . Attends Religious Services: Not on file  . Active Member of Clubs or Organizations: Not on file  . Attends Archivist Meetings: Not on file  . Marital Status: Not on file  Intimate Partner Violence:   . Fear of Current or Ex-Partner: Not on file  . Emotionally Abused: Not on file  . Physically Abused: Not on file  . Sexually Abused: Not on file    ROS Review of Systems  Constitutional: Positive for fatigue. Negative for chills and fever.  HENT: Negative for sore throat and trouble swallowing.   Respiratory: Negative for cough and shortness of breath.   Cardiovascular: Negative for chest pain, palpitations and leg swelling.  Gastrointestinal: Negative for abdominal pain, blood in stool, constipation, diarrhea and nausea.  Endocrine: Negative for polydipsia, polyphagia and polyuria.  Genitourinary: Negative for dysuria and frequency.  Musculoskeletal: Negative for arthralgias and back pain.  Skin: Negative for rash and wound.  Neurological: Negative for dizziness and headaches.  Hematological: Negative for adenopathy. Does not bruise/bleed easily (not currently ).  Psychiatric/Behavioral: Negative for suicidal ideas. The patient is nervous/anxious.     Objective:   Today's Vitals: BP 105/68 (BP Location: Left Arm, Patient Position: Sitting)   Pulse 82   Temp (!) 97.3 F (36.3 C)   Ht 5' 3.5" (1.613 m)   Wt 167 lb 12.8 oz (76.1 kg)   SpO2 94%   BMI 29.26  kg/m   Physical Exam Vitals and nursing note reviewed.  Constitutional:      Appearance: Normal appearance.  Cardiovascular:     Rate and Rhythm: Normal rate and regular rhythm.  Pulmonary:     Effort: Pulmonary effort is normal.     Breath sounds: Normal breath sounds.  Abdominal:     Palpations: Abdomen is soft.     Tenderness: There is no abdominal tenderness. There is no right CVA tenderness, left CVA tenderness, guarding or rebound.  Musculoskeletal:        General: No tenderness.     Cervical back: Normal range of motion and neck supple.     Right lower leg: No edema.     Left lower leg: No edema.  Lymphadenopathy:     Cervical: No cervical adenopathy.  Skin:    General:  Skin is warm and dry.  Neurological:     General: No focal deficit present.     Mental Status: She is alert and oriented to person, place, and time.  Psychiatric:        Mood and Affect: Mood normal.        Behavior: Behavior normal.     Assessment & Plan:  1. Iron deficiency anemia due to chronic blood loss; 2. Pancytopenia Patient will have CBC in follow-up of pancytopenia and iron deficiency anemia.  Prescription for pantoprazole 40 mg daily for stomach protection.  GI referral placed.  Patient will be notified of results of CBC and if hematology referral is needed based on these results. - CBC with Differential - pantoprazole (PROTONIX) 40 MG tablet; Take 1 tablet (40 mg total) by mouth daily.  Dispense: 30 tablet; Refill: 2  3. Alcoholic cirrhosis of liver without ascites ([place]) Patient will have hepatic function panel in follow-up of alcoholic cirrhosis of the liver.  She will be referred to gastroenterology for further evaluation and treatment. - Hepatic Function Panel - Ambulatory referral to Gastroenterology  4. Bipolar 1 disorder ([place]) Refills provided for patient's medications for treatment of bipolar disorder including Seroquel, Lexapro which is being increased to 20 mg daily as patient  does not feel that the 10 mg dose is as effective as 20 mg which he took in the past and refill provided of Topamax.  She will also be referred to medical social worker at this clinic for help with getting patient set up with mental health services once patient is discharged from her current treatment program. - QUEtiapine (SEROQUEL) 100 MG tablet; Take 1 tablet (100 mg total) by mouth at bedtime.  Dispense: 30 tablet; Refill: 2 - escitalopram (LEXAPRO) 20 MG tablet; Take 1 tablet (20 mg total) by mouth daily.  Dispense: 30 tablet; Refill: 3 - topiramate (TOPAMAX) 50 MG tablet; Take 1 tablet (50 mg total) by mouth 2 (two) times daily.  Dispense: 60 tablet; Refill: 2 - Ambulatory referral to Social Work  5. Hypokalemia BMP at today's visit and follow-up of hypokalemia - Basic Metabolic Panel  6. GAD (generalized anxiety disorder) Prescription for Lexapro 20 mg provided for treatment of generalized anxiety disorder and patient is being referred to social worker at this office for help with arranging mental health follow-up. - escitalopram (LEXAPRO) 20 MG tablet; Take 1 tablet (20 mg total) by mouth daily.  Dispense: 30 tablet; Refill: 3 - Ambulatory referral to Social Work  7. Alcohol dependence in remission ([place]) 9. Need for follow-up by social worker She is currently participating in a rehabilitation program and is currently in remission from alcohol dependence.  Referral will be made to social worker at this office to help patient arrange for continued outpatient resources regarding her alcohol dependence, bipolar disorder and generalized anxiety disorder.  Patient had paperwork with her today's visit which was completed and copy made for patient's chart. - Ambulatory referral to Social Work   Outpatient Encounter Medications as of 09/14/2020  Medication Sig  . escitalopram (LEXAPRO) 20 MG tablet Take 1 tablet (20 mg total) by mouth daily.  . QUEtiapine (SEROQUEL) 100 MG tablet Take 1 tablet  (100 mg total) by mouth at bedtime.  . topiramate (TOPAMAX) 50 MG tablet Take 1 tablet (50 mg total) by mouth 2 (two) times daily.  . [DISCONTINUED] escitalopram (LEXAPRO) 20 MG tablet Take 1 tablet (20 mg total) by mouth daily.  . [DISCONTINUED] QUEtiapine (SEROQUEL) 100 MG tablet Take  1 tablet (100 mg total) by mouth at bedtime.  . [DISCONTINUED] topiramate (TOPAMAX) 50 MG tablet Take 1 tablet (50 mg total) by mouth 2 (two) times daily.  . [EXPIRED] Multiple Vitamin (MULTIVITAMIN WITH MINERALS) TABS tablet Take 1 tablet by mouth daily. (Patient not taking: Reported on 09/14/2020)  . pantoprazole (PROTONIX) 40 MG tablet Take 1 tablet (40 mg total) by mouth daily.  . [EXPIRED] thiamine 100 MG tablet Take 1 tablet (100 mg total) by mouth daily. (Patient not taking: Reported on 09/14/2020)  . [DISCONTINUED] chlordiazePOXIDE (LIBRIUM) 25 MG capsule 50mg  PO TID x 1D, then 25-50mg  PO BID X 1D, then 25-50mg  PO QD X 1D (Patient not taking: Reported on 07/27/2020)  . [DISCONTINUED] lactulose (CHRONULAC) 10 GM/15ML solution Take 45 mLs (30 g total) by mouth 2 (two) times daily as needed for mild constipation. (Patient not taking: Reported on 07/27/2020)  . [DISCONTINUED] pantoprazole (PROTONIX) 40 MG tablet Take 1 tablet (40 mg total) by mouth daily. (Patient not taking: Reported on 07/27/2020)   No facility-administered encounter medications on file as of 09/14/2020.    Follow-up: Return in about 6 weeks (around 10/26/2020) for chronic issues- needs to apply for [facility] discount program.    Antony Blackbird, MD

## 2020-09-14 NOTE — Progress Notes (Signed)
Needs refill for lexapro, seroquel, & topamax

## 2020-09-15 LAB — CBC WITH DIFFERENTIAL/PLATELET
Basophils Absolute: 0 x10E3/uL (ref 0.0–0.2)
Basos: 0 %
EOS (ABSOLUTE): 0 x10E3/uL (ref 0.0–0.4)
Eos: 1 %
Hematocrit: 35 % (ref 34.0–46.6)
Hemoglobin: 11.7 g/dL (ref 11.1–15.9)
Immature Grans (Abs): 0 x10E3/uL (ref 0.0–0.1)
Immature Granulocytes: 0 %
Lymphocytes Absolute: 1.2 x10E3/uL (ref 0.7–3.1)
Lymphs: 37 %
MCH: 28.9 pg (ref 26.6–33.0)
MCHC: 33.4 g/dL (ref 31.5–35.7)
MCV: 86 fL (ref 79–97)
Monocytes Absolute: 0.3 x10E3/uL (ref 0.1–0.9)
Monocytes: 9 %
Neutrophils Absolute: 1.6 x10E3/uL (ref 1.4–7.0)
Neutrophils: 53 %
Platelets: 75 x10E3/uL — CL (ref 150–450)
RBC: 4.05 x10E6/uL (ref 3.77–5.28)
RDW: 17.8 % — ABNORMAL HIGH (ref 11.7–15.4)
WBC: 3.1 x10E3/uL — ABNORMAL LOW (ref 3.4–10.8)

## 2020-09-15 LAB — HEPATIC FUNCTION PANEL
ALT: 26 IU/L (ref 0–32)
AST: 40 IU/L (ref 0–40)
Albumin: 3.9 g/dL (ref 3.8–4.8)
Alkaline Phosphatase: 126 IU/L — ABNORMAL HIGH (ref 44–121)
Bilirubin Total: 0.7 mg/dL (ref 0.0–1.2)
Bilirubin, Direct: 0.3 mg/dL (ref 0.00–0.40)
Total Protein: 7.5 g/dL (ref 6.0–8.5)

## 2020-09-15 LAB — BASIC METABOLIC PANEL WITH GFR
BUN/Creatinine Ratio: 9 (ref 9–23)
BUN: 6 mg/dL (ref 6–24)
CO2: 21 mmol/L (ref 20–29)
Calcium: 8.6 mg/dL — ABNORMAL LOW (ref 8.7–10.2)
Chloride: 105 mmol/L (ref 96–106)
Creatinine, Ser: 0.64 mg/dL (ref 0.57–1.00)
GFR calc Af Amer: 126 mL/min/1.73
GFR calc non Af Amer: 110 mL/min/1.73
Glucose: 88 mg/dL (ref 65–99)
Potassium: 3.4 mmol/L — ABNORMAL LOW (ref 3.5–5.2)
Sodium: 137 mmol/L (ref 134–144)

## 2020-09-25 ENCOUNTER — Telehealth: Payer: Self-pay | Admitting: Licensed Clinical Social Worker

## 2020-09-25 NOTE — Telephone Encounter (Signed)
MSW Intern left message for Pt regarding making an appt with social worker due to a referral made my PCP Dr. Chapman Fitch. Office callback number was provided.

## 2020-09-28 ENCOUNTER — Ambulatory Visit (HOSPITAL_COMMUNITY): Payer: No Payment, Other | Admitting: Psychiatry

## 2020-10-01 ENCOUNTER — Telehealth: Payer: Self-pay | Admitting: Licensed Clinical Social Worker

## 2020-10-01 NOTE — Telephone Encounter (Signed)
Call placed to patient regarding IBH referral. LCSW introduced self and explained role at [facility].   Pt shared that she is in need of behavioral health services noting that she is unable to return to [place]. LCSW provided contact information for [facility] to initiate services. Pt was also informed of Financial Counseling services to assist with medical coverage. Pt was appreciative for the assistance and provided consent for LCSW to mail application to address on file.   Pt was strongly encouraged to contact LCSW with any additional behavioral health and/or resource needs.

## 2020-10-12 MED FILL — ESCITALOPRAM 20 MG TABLET: 20 | 30 days supply | Qty: 30 | Fill #1

## 2020-10-12 MED FILL — TOPIRAMATE 50 MG TABLET: 50 | 30 days supply | Qty: 60 | Fill #1

## 2020-10-12 MED FILL — PANTOPRAZOLE SOD DR 40 MG T: 40 | 30 days supply | Qty: 30 | Fill #1

## 2020-10-12 MED FILL — QUETIAPINE FUMARATE 100 MG: 100 | 30 days supply | Qty: 30 | Fill #1

## 2020-11-04 ENCOUNTER — Other Ambulatory Visit: Payer: Self-pay

## 2020-11-04 ENCOUNTER — Encounter (HOSPITAL_COMMUNITY): Payer: Self-pay | Admitting: Emergency Medicine

## 2020-11-04 ENCOUNTER — Emergency Department (HOSPITAL_COMMUNITY)
Admission: EM | Admit: 2020-11-04 | Discharge: 2020-11-05 | Disposition: A | Discharge status: Home / Self Care | Payer: Self-pay | Attending: Emergency Medicine | Admitting: Emergency Medicine

## 2020-11-04 ENCOUNTER — Emergency Department (HOSPITAL_COMMUNITY): Payer: Self-pay

## 2020-11-04 DIAGNOSIS — Z87891 Personal history of nicotine dependence: Secondary | ICD-10-CM | POA: Insufficient documentation

## 2020-11-04 DIAGNOSIS — Z79899 Other long term (current) drug therapy: Secondary | ICD-10-CM | POA: Insufficient documentation

## 2020-11-04 DIAGNOSIS — Y908 Blood alcohol level of 240 mg/100 ml or more: Secondary | ICD-10-CM | POA: Insufficient documentation

## 2020-11-04 DIAGNOSIS — F10129 Alcohol abuse with intoxication, unspecified: Secondary | ICD-10-CM | POA: Insufficient documentation

## 2020-11-04 DIAGNOSIS — F10920 Alcohol use, unspecified with intoxication, uncomplicated: Secondary | ICD-10-CM

## 2020-11-04 DIAGNOSIS — R456 Violent behavior: Secondary | ICD-10-CM | POA: Insufficient documentation

## 2020-11-04 DIAGNOSIS — R4689 Other symptoms and signs involving appearance and behavior: Secondary | ICD-10-CM

## 2020-11-04 DIAGNOSIS — Z20822 Contact with and (suspected) exposure to covid-19: Secondary | ICD-10-CM | POA: Insufficient documentation

## 2020-11-04 DIAGNOSIS — F315 Bipolar disorder, current episode depressed, severe, with psychotic features: Secondary | ICD-10-CM | POA: Insufficient documentation

## 2020-11-04 LAB — CBC
HCT: 34.4 % — ABNORMAL LOW (ref 36.0–46.0)
Hemoglobin: 11.2 g/dL — ABNORMAL LOW (ref 12.0–15.0)
MCH: 28.6 pg (ref 26.0–34.0)
MCHC: 32.6 g/dL (ref 30.0–36.0)
MCV: 87.8 fL (ref 80.0–100.0)
Platelets: 86 10*3/uL — ABNORMAL LOW (ref 150–400)
RBC: 3.92 MIL/uL (ref 3.87–5.11)
RDW: 17.2 % — ABNORMAL HIGH (ref 11.5–15.5)
WBC: 3.3 10*3/uL — ABNORMAL LOW (ref 4.0–10.5)
nRBC: 0 % (ref 0.0–0.2)

## 2020-11-04 LAB — RESP PANEL BY RT-PCR (FLU A&B, COVID) ARPGX2
Influenza A by PCR: NEGATIVE
Influenza B by PCR: NEGATIVE
SARS Coronavirus 2 by RT PCR: NEGATIVE

## 2020-11-04 LAB — RAPID URINE DRUG SCREEN, HOSP PERFORMED
Amphetamines: NOT DETECTED
Barbiturates: NOT DETECTED
Benzodiazepines: NOT DETECTED
Cocaine: NOT DETECTED
Opiates: NOT DETECTED
Tetrahydrocannabinol: NOT DETECTED

## 2020-11-04 LAB — COMPREHENSIVE METABOLIC PANEL
ALT: 17 U/L (ref 0–44)
AST: 30 U/L (ref 15–41)
Albumin: 3.8 g/dL (ref 3.5–5.0)
Alkaline Phosphatase: 97 U/L (ref 38–126)
Anion gap: 7 (ref 5–15)
BUN: 7 mg/dL (ref 6–20)
CO2: 25 mmol/L (ref 22–32)
Calcium: 8.7 mg/dL — ABNORMAL LOW (ref 8.9–10.3)
Chloride: 110 mmol/L (ref 98–111)
Creatinine, Ser: 0.51 mg/dL (ref 0.44–1.00)
GFR, Estimated: >60 mL/min (ref >60)
Glucose, Bld: 101 mg/dL — ABNORMAL HIGH (ref 70–99)
Potassium: 3.2 mmol/L — ABNORMAL LOW (ref 3.5–5.1)
Sodium: 142 mmol/L (ref 135–145)
Total Bilirubin: 0.6 mg/dL (ref 0.3–1.2)
Total Protein: 7.6 g/dL (ref 6.5–8.1)

## 2020-11-04 LAB — I-STAT BETA HCG BLOOD, ED (MC, WL, AP ONLY): I-stat hCG, quantitative: <5 m[IU]/mL (ref <5)

## 2020-11-04 LAB — ETHANOL: Alcohol, Ethyl (B): 315 mg/dL (ref <10)

## 2020-11-04 MED ORDER — LORAZEPAM 1 MG PO TABS
1.0000 mg | ORAL_TABLET | Freq: Once | ORAL | Status: AC
  Administered 2020-11-04: 1 mg via ORAL
  Filled 2020-11-04: qty 1

## 2020-11-04 MED ORDER — ONDANSETRON 4 MG PO TBDP
4.0000 mg | ORAL_TABLET | Freq: Once | ORAL | Status: AC | PRN
  Administered 2020-11-04: 4 mg via ORAL
  Filled 2020-11-04: qty 1
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
## 2020-11-04 NOTE — ED Notes (Signed)
Pt walking around triage. This writer instructed patient to stay in the room and cannot be wandering around triage area.

## 2020-11-04 NOTE — ED Notes (Signed)
Nurse made aware of O2

## 2020-11-04 NOTE — ED Notes (Signed)
TTS at bedside. 
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
## 2020-11-05 MED ORDER — THIAMINE HCL 100 MG PO TABS
100.0000 mg | ORAL_TABLET | Freq: Once | ORAL | Status: AC
  Administered 2020-11-05: 100 mg via ORAL
  Filled 2020-11-05: qty 1

## 2020-11-05 MED ORDER — NEPHRO-VITE RX 1 MG PO TABS: 1.0000 | ORAL_TABLET | Freq: Every day | ORAL | 0 refills | Status: DC

## 2020-11-05 MED ORDER — THIAMINE HCL 100 MG PO TABS: 100.0000 mg | ORAL_TABLET | Freq: Every day | ORAL | 0 refills | Status: DC

## 2020-11-05 MED ORDER — LORAZEPAM 1 MG PO TABS
1.0000 mg | ORAL_TABLET | Freq: Once | ORAL | Status: AC
  Administered 2020-11-05: 1 mg via ORAL
  Filled 2020-11-05: qty 1

## 2020-11-05 NOTE — ED Notes (Signed)
Patient ambulated without oxygen. O2 sats 92-93% room air

## 2020-11-05 NOTE — ED Provider Notes (Signed)
5:25 AM  Assumed care.  Patient is a 44 year old female with history of alcohol abuse.  Has history of depression.  Seen by TTS.  TTS recommends outpatient substance abuse resources.  Patient was complaining of shortness of breath.  Had a brief drop in saturation in the upper 80s.  Chest x-ray clear.  Covid negative.  Sats while ambulating between 92 to 93% off oxygen.  I feel patient is safe for discharge home.  Questions answered.  She is comfortable with plan.   At this time, I do not feel there is any life-threatening condition present. I have reviewed, interpreted and discussed all results (EKG, imaging, lab, urine as appropriate) and exam findings with patient/family. I have reviewed nursing notes and appropriate previous records.  I feel the patient is safe to be discharged home without further emergent workup and can continue workup as an outpatient as needed. Discussed usual and customary return precautions. Patient/family verbalize understanding and are comfortable with this plan.  Outpatient follow-up has been provided as needed. All questions have been answered.    Phung Kotas, Delice Bison, DO 11/05/20 [phone]
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
## 2020-11-05 NOTE — ED Notes (Addendum)
Pt. In burgundy scrubs and wanded by security. Pt. Has 1 belongings bag locked up in TCU locker# 31. Pt. Has 1 pr. Shoes, 1  Pk. cigarettes, 1 burgundy shirt, 1 black pant, 1 lighter, 1 white sweater. Pt. Has no cell phone, purse and no money.

## 2020-11-07 DIAGNOSIS — F3130 Bipolar disorder, current episode depressed, mild or moderate severity, unspecified: Secondary | ICD-10-CM | POA: Insufficient documentation

## 2020-11-09 DIAGNOSIS — S61519A Laceration without foreign body of unspecified wrist, initial encounter: Secondary | ICD-10-CM | POA: Insufficient documentation

## 2020-11-10 ENCOUNTER — Other Ambulatory Visit: Payer: Self-pay | Admitting: Internal Medicine

## 2020-11-18 MED FILL — TOPIRAMATE 50 MG TABLET: 50 | 30 days supply | Qty: 60 | Fill #2

## 2020-11-18 MED FILL — ESCITALOPRAM 20 MG TABLET: 20 | 30 days supply | Qty: 30 | Fill #2

## 2020-11-18 MED FILL — QUETIAPINE FUMARATE 100 MG: 100 | 30 days supply | Qty: 30 | Fill #2

## 2020-11-19 MED FILL — NALTREXONE 50 MG TABLET: 50 | 30 days supply | Qty: 30 | Fill #0

## 2020-12-04 ENCOUNTER — Other Ambulatory Visit: Payer: Self-pay

## 2020-12-04 ENCOUNTER — Telehealth (INDEPENDENT_AMBULATORY_CARE_PROVIDER_SITE_OTHER): Payer: No Payment, Other | Admitting: Physician Assistant

## 2020-12-04 ENCOUNTER — Other Ambulatory Visit (HOSPITAL_COMMUNITY): Payer: Self-pay | Admitting: Physician Assistant

## 2020-12-04 DIAGNOSIS — F411 Generalized anxiety disorder: Secondary | ICD-10-CM | POA: Diagnosis not present

## 2020-12-04 DIAGNOSIS — F1010 Alcohol abuse, uncomplicated: Secondary | ICD-10-CM | POA: Diagnosis not present

## 2020-12-04 DIAGNOSIS — F319 Bipolar disorder, unspecified: Secondary | ICD-10-CM

## 2020-12-04 MED ORDER — TOPIRAMATE 50 MG PO TABS: 50.0000 mg | ORAL_TABLET | Freq: Two times a day (BID) | ORAL | 1 refills | Status: DC

## 2020-12-04 MED ORDER — NALTREXONE HCL 50 MG PO TABS: 50.0000 mg | ORAL_TABLET | Freq: Every day | ORAL | 1 refills | Status: DC

## 2020-12-04 MED ORDER — CLONAZEPAM 0.5 MG PO TABS: 0.5000 mg | ORAL_TABLET | Freq: Two times a day (BID) | ORAL | 0 refills | Status: DC | PRN

## 2020-12-04 MED ORDER — ESCITALOPRAM OXALATE 20 MG PO TABS: 20.0000 mg | ORAL_TABLET | Freq: Every day | ORAL | 1 refills | Status: DC

## 2020-12-04 MED ORDER — QUETIAPINE FUMARATE 100 MG PO TABS: 100.0000 mg | ORAL_TABLET | Freq: Every day | ORAL | 1 refills | Status: DC
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
## 2020-12-07 ENCOUNTER — Encounter (HOSPITAL_COMMUNITY): Payer: Self-pay | Admitting: Physician Assistant

## 2020-12-18 MED FILL — TOPIRAMATE 50 MG TABLET: 50 | 30 days supply | Qty: 60 | Fill #0

## 2020-12-18 MED FILL — ESCITALOPRAM 20 MG TABLET: 20 | 30 days supply | Qty: 30 | Fill #3

## 2020-12-18 MED FILL — NALTREXONE 50 MG TABLET: 50 | 30 days supply | Qty: 30 | Fill #0

## 2020-12-18 MED FILL — QUETIAPINE FUMARATE 100 MG: 100 | 30 days supply | Qty: 30 | Fill #0

## 2020-12-23 ENCOUNTER — Ambulatory Visit: Payer: Medicaid Other | Admitting: Gastroenterology

## 2021-01-06 ENCOUNTER — Other Ambulatory Visit: Payer: Self-pay

## 2021-01-06 ENCOUNTER — Telehealth (INDEPENDENT_AMBULATORY_CARE_PROVIDER_SITE_OTHER): Payer: No Payment, Other | Admitting: Physician Assistant

## 2021-01-06 ENCOUNTER — Other Ambulatory Visit (HOSPITAL_COMMUNITY): Payer: Self-pay | Admitting: Physician Assistant

## 2021-01-06 DIAGNOSIS — F411 Generalized anxiety disorder: Secondary | ICD-10-CM | POA: Diagnosis not present

## 2021-01-06 DIAGNOSIS — F319 Bipolar disorder, unspecified: Secondary | ICD-10-CM

## 2021-01-06 DIAGNOSIS — F1010 Alcohol abuse, uncomplicated: Secondary | ICD-10-CM

## 2021-01-06 MED ORDER — CLONAZEPAM 0.5 MG PO TABS: 0.5000 mg | ORAL_TABLET | Freq: Two times a day (BID) | ORAL | 0 refills | Status: DC | PRN

## 2021-01-06 MED ORDER — NALTREXONE HCL 50 MG PO TABS
50.0000 mg | ORAL_TABLET | Freq: Every day | ORAL | 1 refills | Status: DC
Start: 2021-01-06 — End: 2021-01-06

## 2021-01-06 MED ORDER — ESCITALOPRAM OXALATE 20 MG PO TABS
20.0000 mg | ORAL_TABLET | Freq: Every day | ORAL | 1 refills | Status: DC
Start: 2021-01-06 — End: 2021-02-17

## 2021-01-06 MED ORDER — QUETIAPINE FUMARATE 100 MG PO TABS
100.0000 mg | ORAL_TABLET | Freq: Every day | ORAL | 1 refills | Status: DC
Start: 2021-01-06 — End: 2021-01-06

## 2021-01-06 MED ORDER — TOPIRAMATE 50 MG PO TABS
50.0000 mg | ORAL_TABLET | Freq: Two times a day (BID) | ORAL | 1 refills | Status: DC
Start: 2021-01-06 — End: 2021-01-21

## 2021-01-06 NOTE — Progress Notes (Signed)
BH MD/PA/NP OP Progress Note  Virtual Visit via Video Note  I connected with [name] on 01/06/21 at  4:30 PM EST by a video enabled telemedicine application and verified that I am speaking with the correct person using two identifiers.  Location: Patient: Home Provider: Clinic   I discussed the limitations of evaluation and management by telemedicine and the availability of in person appointments. The patient expressed understanding and agreed to proceed.  Follow Up Instructions:  I discussed the assessment and treatment plan with the patient. The patient was provided an opportunity to ask questions and all were answered. The patient agreed with the plan and demonstrated an understanding of the instructions.   The patient was advised to call back or seek an in-person evaluation if the symptoms worsen or if the condition fails to improve as anticipated.  I provided 25 minutes of non-face-to-face time during this encounter.  [name], PA   01/06/2021 5:32 PM Sara [name]  MRN:  [id number]  Chief Complaint: Follow-up and medication management  HPI:   Sara [name]. Digeronimo is a 45 year old female with a past psychiatric history significant for major depressive disorder, generalized anxiety disorder, bipolar disorder, and PTSD who present to [facility] via virtual video visit for follow-up and medication management. Patient is currently managed on the following medications:  Naltrexone 50 mg daily Quetiapine 100 mg at bedtime Escitalopram 20 mg daily Topiramate 50 mg 2 times daily Klonopin 0.5 mg 2 times daily as needed  Patient reports that everything is going well with her current medication regimen and she has no issues or concerns at this time. Patient reports that her mood has been at the low point lately. Patient reports lack of motivation. Patient states that her therapist encouraged her to try to be more  active, take up volunteering, and going outside to receive some light in order to improve mood. Patient states that she will try to incorporate these activities and her activities of daily living. Patient also inquired about any of her medications causing false positives detection of oxycodone and urine test. Provider advised patient that letter could be written discussing type of medications the patient is currently taking to the lab workers that her testing her urine. Patient was receptive to plan.  Patient is pleasant, calm, cooperative, and fully engaged in conversation during the encounter. Patient reports that her mood is currently balanced. Patient denies suicidal and homicidal ideations. She further denies auditory or visual hallucinations. Patient endorses good sleep and receives on average 8 to 9 hours of sleep each night. Patient endorses good appetite and eats on average 3 meals per day. Patient denies alcohol consumption, tobacco use, and illicit drug use.  Visit Diagnosis:    ICD-10-CM   1. Bipolar 1 disorder (HCC)  F31.9 QUEtiapine (SEROQUEL) 100 MG tablet    escitalopram (LEXAPRO) 20 MG tablet    topiramate (TOPAMAX) 50 MG tablet  2. Generalized anxiety disorder  F41.1 clonazePAM (KLONOPIN) 0.5 MG tablet  3. Alcohol abuse  F10.10 naltrexone (DEPADE) 50 MG tablet    Past Psychiatric History:  Major Depressive Disorder Generalized Anxiety Disorder Bipolar Disorder PTSD  Past Medical History:  Past Medical History:  Diagnosis Date   Alcohol abuse    Alcoholism ([place])    Anemia    Anxiety    Blood transfusion without reported diagnosis    Cirrhosis ([place])    Depression    Esophageal varices with bleeding(456.0) 06/13/2014   GERD (  gastroesophageal reflux disease)    Heart murmur    Patient states she may have   Menorrhagia    Pancytopenia ([place]) 01/15/2014   Pneumonia    Portal hypertension ([place])    S/P alcohol detoxification    2-3 days at behavioral health  previously   UGI bleed 06/12/2014    Past Surgical History:  Procedure Laterality Date   CHOLECYSTECTOMY     ESOPHAGOGASTRODUODENOSCOPY N/A 06/12/2014   Procedure: ESOPHAGOGASTRODUODENOSCOPY (EGD);  Surgeon: Gatha Mayer, MD;  Location: [facility];  Service: Endoscopy;  Laterality: N/A;   ESOPHAGOGASTRODUODENOSCOPY (EGD) WITH PROPOFOL N/A 07/29/2014   Procedure: ESOPHAGOGASTRODUODENOSCOPY (EGD) WITH PROPOFOL;  Surgeon: Inda Castle, MD;  Location: [facility];  Service: Endoscopy;  Laterality: N/A;   ESOPHAGOGASTRODUODENOSCOPY (EGD) WITH PROPOFOL N/A 01/20/2018   Procedure: ESOPHAGOGASTRODUODENOSCOPY (EGD) WITH PROPOFOL;  Surgeon: Mauri Pole, MD;  Location: [facility];  Service: Endoscopy;  Laterality: N/A;    Family Psychiatric History:  None  Family History:  Family History  Problem Relation Age of Onset   Colon polyps Mother    Hypertension Mother    Thyroid disease Mother    Alcoholism Mother    Alcoholism Father    Alcohol abuse Maternal Grandfather    Alcohol abuse Paternal Grandfather    Alcohol abuse Paternal Aunt    Alcohol abuse Maternal Uncle     Social History:  Social History   Socioeconomic History   Marital status: Married    Spouse name: [name]   Number of children: 2   Years of education: Associates   Highest education level: Not on file  Occupational History   Occupation: Paralegal  Tobacco Use   Smoking status: Former Smoker    Packs/day: 0.25    Types: Cigarettes    Quit date: 11/22/2018    Years since quitting: 2.1   Smokeless tobacco: Never Used  Substance and Sexual Activity   Alcohol use: Yes    Alcohol/week: 0.0 standard drinks    Comment: Usually drinks 2-3 bottles of wine daily when drinking.    Drug use: No    Types: Cocaine, Marijuana    Comment: denies   Sexual activity: Never    Birth control/protection: None  Other Topics Concern   Not on file  Social History Narrative   Lives with  husband and son. Daughter lives nearby. 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   Social Determinants of Health   Financial Resource Strain: Not on file  Food Insecurity: Not on file  Transportation Needs: Not on file  Physical Activity: Not on file  Stress: Not on file  Social Connections: Not on file    Allergies:  Allergies  Allergen Reactions   Morphine And Related Other (See Comments)    Slowed HR, lowered BP   Nsaids Other (See Comments)    Caused internal bleeding    Metabolic Disorder Labs: Lab Results  Component Value Date   HGBA1C 4.6 (L) 04/21/2019   MPG 85.32 04/21/2019   MPG 82 05/04/2017   Lab Results  Component Value Date   PROLACTIN 28.7 (H) 05/04/2017   Lab Results  Component Value Date   CHOL 129 04/21/2019   TRIG 46 04/21/2019   HDL 44 04/21/2019   CHOLHDL 2.9 04/21/2019   VLDL 9 04/21/2019   LDLCALC 76 04/21/2019   LDLCALC 110 (H) 05/04/2017   Lab Results  Component Value Date   TSH 3.658 06/24/2020   TSH 1.639 04/02/2019    Therapeutic  Level Labs: No results found for: LITHIUM No results found for: VALPROATE No components found for:  CBMZ  Current Medications: Current Outpatient Medications  Medication Sig Dispense Refill   B Complex-C-Folic Acid (B COMPLEX-VITAMIN C-FOLIC ACID) 1 MG tablet Take 1 tablet by mouth daily with breakfast. 90 tablet 0   clonazePAM (KLONOPIN) 0.5 MG tablet Take 1 tablet (0.5 mg total) by mouth 2 (two) times daily as needed for anxiety. 60 tablet 0   escitalopram (LEXAPRO) 20 MG tablet Take 1 tablet (20 mg total) by mouth daily. 30 tablet 1   naltrexone (DEPADE) 50 MG tablet Take 1 tablet (50 mg total) by mouth daily. 30 tablet 1   pantoprazole (PROTONIX) 40 MG tablet Take 1 tablet (40 mg total) by mouth daily. 30 tablet 2   QUEtiapine (SEROQUEL) 100 MG tablet Take 1 tablet (100 mg total) by mouth at bedtime. 30 tablet 1   thiamine 100 MG tablet Take 1 tablet (100 mg total) by  mouth daily. 90 tablet 0   topiramate (TOPAMAX) 50 MG tablet Take 1 tablet (50 mg total) by mouth 2 (two) times daily. 60 tablet 1   No current facility-administered medications for this visit.     Musculoskeletal: Strength & Muscle Tone: Unable to assess due to telemedicine visit [place]: Unable to assess due to telemedicine visit Patient leans: Unable to assess due to telemedicine visit  Psychiatric Specialty Exam: Review of Systems  Psychiatric/Behavioral: Negative for decreased concentration, dysphoric mood, hallucinations, self-injury, sleep disturbance and suicidal ideas. The patient is not nervous/anxious and is not hyperactive.     There were no vitals taken for this visit.There is no height or weight on file to calculate BMI.  General Appearance: Fairly Groomed  Eye Contact:  Good  Speech:  Clear and Coherent and Normal Rate  Volume:  Normal  Mood:  Dysphoric  Affect:  Congruent and Depressed  Thought Process:  Coherent, Goal Directed and Descriptions of Associations: Intact  Orientation:  Full (Time, Place, and Person)  Thought Content: WDL and Logical   Suicidal Thoughts:  No  Homicidal Thoughts:  No  Memory:  Immediate;   Good Recent;   Good Remote;   Good  Judgement:  Good  Insight:  Good  Psychomotor Activity:  Normal  Concentration:  Concentration: Good and Attention Span: Good  Recall:  Good  Fund of Knowledge: Good  Language: Good  Akathisia:  NA  Handed:  Right  AIMS (if indicated): not done  Assets:  Communication Skills Desire for Improvement Housing Social Support  ADL's:  Intact  Cognition: WNL  Sleep:  Good   Screenings: AIMS   Flowsheet Row Admission (Discharged) from 04/19/2019 in [place] [facility] Admission (Discharged) from 05/02/2017 in [place] [facility]  AIMS Total Score 0 0    AUDIT   Flowsheet Row Admission (Discharged) from 04/19/2019 in [place] [facility] Admission (Discharged) from 05/02/2017 in [place] [facility] Admission (Discharged) from 12/22/2014 in [place] [facility] Admission (Discharged) from 02/02/2014 in [place] [facility] Admission (Discharged) from 09/27/2013 in [place] [facility]  Alcohol Use Disorder Identification Test Final Score (AUDIT) 34 34 29 38 34    GAD-7   Flowsheet Row Video Visit from 01/06/2021 in [facility] Video Visit from 12/04/2020 in [facility] Video Visit from 07/24/2019 in [place]  Total GAD-7 Score 17 15 2     PHQ2-9   Flowsheet Row Video Visit from 01/06/2021 in [facility] Video Visit from 12/04/2020 in [facility] Office Visit from 09/14/2020 in [place] Video Visit from 07/24/2019 in [place] Office Visit from 04/02/2016 in [facility] [place] Total Score 5 4 0 0 2  PHQ-9 Total Score 15 19 4  -- 18    Flowsheet Row Video Visit from 01/06/2021 in [facility] ED from 06/13/2019 in [place] DEPT Admission (Discharged) from 04/19/2019 in [place] [facility]  C-SSRS RISK CATEGORY No Risk High Risk Low Risk       Assessment and Plan:   Braylynn Lewing. Barber is a 46 year old female with a past psychiatric history significant for major depressive disorder, generalized anxiety disorder, bipolar disorder, and PTSD who present to [facility] via virtual video visit for follow-up and medication management. Patient reports that everything is going well with her current medication regimen and she has no issues or concerns at this time. Prior to the encounter, patient states that her  mood was at a low point. After talking about her mood with her licensed therapist, patient was encouraged to stay active, volunteer, and get some light as a way to improve her mood.  Patient would also like to receive a letter detailing the medications that she is currently taking so that whenever she takes another drug test the lab workers can cross-reference the results with the type of medication she is currently taking. Patient's medications will be e-prescribed to pharmacy of choice.  1. Bipolar 1 disorder (HCC)  - QUEtiapine (SEROQUEL) 100 MG tablet; Take 1 tablet (100 mg total) by mouth at bedtime.  Dispense: 30 tablet; Refill: 1 - escitalopram (LEXAPRO) 20 MG tablet; Take 1 tablet (20 mg total) by mouth daily.  Dispense: 30 tablet; Refill: 1 - topiramate (TOPAMAX) 50 MG tablet; Take 1 tablet (50 mg total) by mouth 2 (two) times daily.  Dispense: 60 tablet; Refill: 1  2. Generalized anxiety disorder  - clonazePAM (KLONOPIN) 0.5 MG tablet; Take 1 tablet (0.5 mg total) by mouth 2 (two) times daily as needed for anxiety.  Dispense: 60 tablet; Refill: 0  3. Alcohol abuse  - naltrexone (DEPADE) 50 MG tablet; Take 1 tablet (50 mg total) by mouth daily.  Dispense: 30 tablet; Refill: 1  Patient to follow-up in 6 weeks  [name], PA 01/06/2021, 5:32 PM

## 2021-01-07 ENCOUNTER — Encounter (HOSPITAL_COMMUNITY): Payer: Self-pay | Admitting: Physician Assistant

## 2021-01-20 ENCOUNTER — Ambulatory Visit (HOSPITAL_COMMUNITY)
Admission: EM | Admit: 2021-01-20 | Discharge: 2021-01-21 | Disposition: A | Discharge status: Home / Self Care | Payer: No Payment, Other | Attending: Nurse Practitioner | Admitting: Nurse Practitioner

## 2021-01-20 ENCOUNTER — Other Ambulatory Visit: Payer: Self-pay

## 2021-01-20 DIAGNOSIS — F3130 Bipolar disorder, current episode depressed, mild or moderate severity, unspecified: Secondary | ICD-10-CM | POA: Diagnosis not present

## 2021-01-20 DIAGNOSIS — Z20822 Contact with and (suspected) exposure to covid-19: Secondary | ICD-10-CM | POA: Insufficient documentation

## 2021-01-20 DIAGNOSIS — Y908 Blood alcohol level of 240 mg/100 ml or more: Secondary | ICD-10-CM | POA: Insufficient documentation

## 2021-01-20 DIAGNOSIS — Z87891 Personal history of nicotine dependence: Secondary | ICD-10-CM | POA: Insufficient documentation

## 2021-01-20 DIAGNOSIS — Z8659 Personal history of other mental and behavioral disorders: Secondary | ICD-10-CM | POA: Insufficient documentation

## 2021-01-20 DIAGNOSIS — F10229 Alcohol dependence with intoxication, unspecified: Secondary | ICD-10-CM | POA: Insufficient documentation

## 2021-01-20 DIAGNOSIS — F332 Major depressive disorder, recurrent severe without psychotic features: Secondary | ICD-10-CM

## 2021-01-20 DIAGNOSIS — Z79899 Other long term (current) drug therapy: Secondary | ICD-10-CM | POA: Insufficient documentation

## 2021-01-20 DIAGNOSIS — F10129 Alcohol abuse with intoxication, unspecified: Secondary | ICD-10-CM

## 2021-01-20 DIAGNOSIS — F411 Generalized anxiety disorder: Secondary | ICD-10-CM | POA: Insufficient documentation

## 2021-01-20 DIAGNOSIS — F4310 Post-traumatic stress disorder, unspecified: Secondary | ICD-10-CM | POA: Insufficient documentation

## 2021-01-20 DIAGNOSIS — R45851 Suicidal ideations: Secondary | ICD-10-CM | POA: Insufficient documentation

## 2021-01-20 LAB — POCT URINE DRUG SCREEN - MANUAL ENTRY (I-SCREEN)
POC Amphetamine UR: NOT DETECTED
POC Buprenorphine (BUP): NOT DETECTED
POC Cocaine UR: NOT DETECTED
POC Marijuana UR: NOT DETECTED
POC Methadone UR: NOT DETECTED
POC Methamphetamine UR: NOT DETECTED
POC Morphine: NOT DETECTED
POC Oxazepam (BZO): NOT DETECTED
POC Oxycodone UR: NOT DETECTED
POC Secobarbital (BAR): NOT DETECTED

## 2021-01-20 LAB — POCT PREGNANCY, URINE: Preg Test, Ur: NEGATIVE

## 2021-01-20 LAB — POC SARS CORONAVIRUS 2 AG -  ED: SARS Coronavirus 2 Ag: NEGATIVE

## 2021-01-20 MED ORDER — THIAMINE HCL 100 MG PO TABS
100.0000 mg | ORAL_TABLET | Freq: Every day | ORAL | Status: DC
Start: 2021-01-21 — End: 2021-01-20

## 2021-01-20 MED ORDER — ACETAMINOPHEN 325 MG PO TABS: 650.0000 mg | ORAL_TABLET | Freq: Four times a day (QID) | ORAL | Status: DC | PRN

## 2021-01-20 MED ORDER — LORAZEPAM 1 MG PO TABS: 1.0000 mg | ORAL_TABLET | Freq: Every day | ORAL | Status: DC

## 2021-01-20 MED ORDER — HYDROXYZINE HCL 25 MG PO TABS: 25.0000 mg | ORAL_TABLET | Freq: Four times a day (QID) | ORAL | Status: DC | PRN

## 2021-01-20 MED ORDER — MAGNESIUM HYDROXIDE 400 MG/5ML PO SUSP: 30.0000 mL | Freq: Every day | ORAL | Status: DC | PRN

## 2021-01-20 MED ORDER — LORAZEPAM 1 MG PO TABS: 1.0000 mg | ORAL_TABLET | Freq: Three times a day (TID) | ORAL | Status: DC

## 2021-01-20 MED ORDER — ADULT MULTIVITAMIN W/MINERALS CH
1.0000 | ORAL_TABLET | Freq: Every day | ORAL | Status: DC
  Administered 2021-01-20 – 2021-01-21 (×2): 1 via ORAL
  Filled 2021-01-20 (×2): qty 1

## 2021-01-20 MED ORDER — ESCITALOPRAM OXALATE 10 MG PO TABS
20.0000 mg | ORAL_TABLET | Freq: Every day | ORAL | Status: DC
  Administered 2021-01-21: 20 mg via ORAL
  Filled 2021-01-20: qty 2

## 2021-01-20 MED ORDER — TOPIRAMATE 25 MG PO TABS
50.0000 mg | ORAL_TABLET | Freq: Two times a day (BID) | ORAL | Status: DC
  Administered 2021-01-20: 50 mg via ORAL
  Filled 2021-01-20: qty 2

## 2021-01-20 MED ORDER — THIAMINE HCL 100 MG PO TABS
100.0000 mg | ORAL_TABLET | Freq: Every day | ORAL | Status: DC
  Administered 2021-01-20 – 2021-01-21 (×2): 100 mg via ORAL
  Filled 2021-01-20 (×2): qty 1

## 2021-01-20 MED ORDER — LOPERAMIDE HCL 2 MG PO CAPS: 2.0000 mg | ORAL_CAPSULE | ORAL | Status: DC | PRN

## 2021-01-20 MED ORDER — ONDANSETRON 4 MG PO TBDP: 4.0000 mg | ORAL_TABLET | Freq: Four times a day (QID) | ORAL | Status: DC | PRN

## 2021-01-20 MED ORDER — ALUM & MAG HYDROXIDE-SIMETH 200-200-20 MG/5ML PO SUSP: 30.0000 mL | ORAL | Status: DC | PRN

## 2021-01-20 MED ORDER — LORAZEPAM 1 MG PO TABS: 1.0000 mg | ORAL_TABLET | Freq: Four times a day (QID) | ORAL | Status: DC | PRN

## 2021-01-20 MED ORDER — LORAZEPAM 1 MG PO TABS: 1.0000 mg | ORAL_TABLET | Freq: Two times a day (BID) | ORAL | Status: DC

## 2021-01-20 MED ORDER — QUETIAPINE FUMARATE 100 MG PO TABS
100.0000 mg | ORAL_TABLET | Freq: Every day | ORAL | Status: DC
  Administered 2021-01-20: 100 mg via ORAL
  Filled 2021-01-20: qty 1

## 2021-01-20 MED ORDER — LORAZEPAM 1 MG PO TABS
1.0000 mg | ORAL_TABLET | Freq: Four times a day (QID) | ORAL | Status: DC
Start: 2021-01-20 — End: 2021-01-21
  Administered 2021-01-20: 1 mg via ORAL
  Filled 2021-01-20: qty 1

## 2021-01-20 MED FILL — QUETIAPINE FUMARATE 100 MG: 100 | 30 days supply | Qty: 30 | Fill #0

## 2021-01-20 MED FILL — TOPIRAMATE 50 MG TABLET: 50 | 30 days supply | Qty: 60 | Fill #0

## 2021-01-20 MED FILL — ESCITALOPRAM 20 MG TABLET: 20 | 30 days supply | Qty: 30 | Fill #0

## 2021-01-20 MED FILL — NALTREXONE 50 MG TABLET: 50 | 30 days supply | Qty: 30 | Fill #0
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
## 2021-01-20 NOTE — ED Notes (Signed)
[facility] #[id number] - details on belonging sheet hardcopy

## 2021-01-20 NOTE — ED Provider Notes (Incomplete)
Behavioral Health Admission H&P [facility] & OBS)  Date: 01/20/21 Patient Name: Sara [name] MRN: [id number] Chief Complaint:  Chief Complaint  Patient presents with  . Suicidal  . 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 female with a history of bipolar disorder, GAD, PTSD, and alcohol use disorder who presents to [facility] voluntarily with Event organiser. Patient is followed by Ileene Musa, PA at [facility]. Patient reports that she is suicidal without a specific plan. She states that she drank 1.5 bottles of wine around 2 pm today. However, she reported to TTS that she drank four fifths of liquor. Patient appears to be intoxicated. BAL 303. Patient reports a history of delirium tremens approximately 2 years ago. She denies a history of withdrawal seizures. She denies homicidal ideations. She denies auditory and visual hallucinations. No indication that she is responding to internal stimuli.    PHQ 2-9:  Flowsheet Row Video Visit from 01/06/2021 in [facility] Video Visit from 12/04/2020 in [facility] Office Visit from 09/14/2020 in [place]  Thoughts that you would be better off dead, or of hurting yourself in some way Not at all Several days Not at all  PHQ-9 Total Score '15 19 4      ' Flowsheet Row Video Visit from 01/06/2021 in [facility] ED from 06/13/2019 in [place] DEPT Admission (Discharged) from 04/19/2019 in [place] 300B  C-SSRS RISK CATEGORY No Risk High Risk Low Risk       Total Time  spent with patient: 20 minutes  Musculoskeletal  Strength & Muscle Tone: within normal limits Gait & Station: normal Patient leans: N/A  Psychiatric Specialty Exam  Presentation General Appearance: Disheveled  Eye Contact:Fair  Speech:Slurred  Speech Volume:Decreased  Handedness:No data recorded  Mood and Affect  Mood:Anxious; Depressed  Affect:Congruent; Tearful; Depressed   Thought Process  Thought Processes:Coherent  Descriptions of Associations:Intact  Orientation:Full (Time, Place and Person)  Thought Content:Logical   Hallucinations:Hallucinations: None  Ideas of Reference:None  Suicidal Thoughts:Suicidal Thoughts: Yes, Active SI Active Intent and/or Plan: Without Intent; Without Plan  Homicidal Thoughts:Homicidal Thoughts: No   Sensorium  Memory:Immediate Fair; Recent Fair  Judgment:Impaired  Insight:Present   Executive Functions  Concentration:Fair  Attention Span:Fair  [place]   Psychomotor Activity  Psychomotor Activity:Psychomotor Activity: Restlessness   Assets  Assets:Communication Skills; Desire for Improvement; Housing; Social Support; Physical Health   Sleep  Sleep:Sleep: Fair   Nutritional Assessment (For OBS and [facility] admissions only) Has the patient had a weight loss or gain of 10 pounds or more in the last 3 months?: No Has the patient had a decrease in food intake/or appetite?: No Does the patient have dental problems?: No Does the patient have eating habits or behaviors that may be indicators of an eating disorder including binging or inducing vomiting?: No Has the patient recently lost weight without trying?: No Has the patient been eating poorly because of a decreased appetite?: No Malnutrition Screening Tool Score: 0    Physical Exam Constitutional:      General: She is not in acute distress.  Appearance: She is not ill-appearing, toxic-appearing or  diaphoretic.  HENT:     Head: Normocephalic.     Right Ear: External ear normal.     Left Ear: External ear normal.  Eyes:     Conjunctiva/sclera: Conjunctivae normal.     Pupils: Pupils are equal, round, and reactive to light.  Cardiovascular:     Rate and Rhythm: Normal rate.  Pulmonary:     Effort: Pulmonary effort is normal. No respiratory distress.  Musculoskeletal:        General: Normal range of motion.  Skin:    General: Skin is warm and dry.  Neurological:     Mental Status: She is alert and oriented to person, place, and time.  Psychiatric:        Mood and Affect: Mood is anxious and depressed.        Thought Content: Thought content is not paranoid or delusional. Thought content includes suicidal ideation. Thought content does not include homicidal ideation. Thought content does not include suicidal plan.    Review of Systems  Constitutional: Negative for chills, diaphoresis, fever, malaise/fatigue and weight loss.  Respiratory: Negative for cough and shortness of breath.   Cardiovascular: Negative for chest pain.  Gastrointestinal: Negative for diarrhea, nausea and vomiting.  Musculoskeletal: Negative for myalgias.  Neurological: Negative for dizziness and seizures.  Psychiatric/Behavioral: Positive for depression, substance abuse and suicidal ideas. Negative for hallucinations and memory loss. The patient is nervous/anxious and has insomnia.     Blood pressure 96/63, pulse 97, temperature 98.7 F (37.1 C), temperature source Oral, resp. rate 20, SpO2 95 %. There is no height or weight on file to calculate BMI.  Past Psychiatric History:  Major Depressive Disorder, Generalized Anxiety Disorder, Bipolar Disorder, PTSD  Is the patient at risk to self? Yes  Has the patient been a risk to self in the past 6 months? Yes .    Has the patient been a risk to self within the distant past? Yes   Is the patient a risk to others? No   Has the patient been a risk to others  in the past 6 months? No   Has the patient been a risk to others within the distant past? No   Past Medical History:  Past Medical History:  Diagnosis Date  . Alcohol abuse   . Alcoholism ([place])   . Anemia   . Anxiety   . Blood transfusion without reported diagnosis   . Cirrhosis ([place])   . Depression   . 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 GERD (gastroesophageal reflux disease)   . Heart murmur    Patient states she may have  . Menorrhagia   . Pancytopenia ([place]) 01/15/2014  . Pneumonia   . Portal hypertension ([place])   . S/P alcohol detoxification    2-3 days at behavioral health previously  . UGI bleed 06/12/2014    Past Surgical History:  Procedure Laterality Date  . CHOLECYSTECTOMY    . ESOPHAGOGASTRODUODENOSCOPY N/A 06/12/2014   Procedure: ESOPHAGOGASTRODUODENOSCOPY (EGD);  Surgeon: Gatha Mayer, MD;  Location: [facility];  Service: Endoscopy;  Laterality: N/A;  . ESOPHAGOGASTRODUODENOSCOPY (EGD) WITH PROPOFOL N/A 07/29/2014   Procedure: ESOPHAGOGASTRODUODENOSCOPY (EGD) WITH PROPOFOL;  Surgeon: Inda Castle, MD;  Location: [facility];  Service: Endoscopy;  Laterality: N/A;  . ESOPHAGOGASTRODUODENOSCOPY (EGD) WITH PROPOFOL N/A 01/20/2018   Procedure: ESOPHAGOGASTRODUODENOSCOPY (EGD) WITH PROPOFOL;  Surgeon: Mauri Pole, MD;  Location: [facility];  Service: Endoscopy;  Laterality: N/A;  Family History:  Family History  Problem Relation Age of Onset  . Colon polyps Mother   . Hypertension Mother   . Thyroid disease Mother   . Alcoholism Mother   . Alcoholism Father   . Alcohol abuse Maternal Grandfather   . Alcohol abuse Paternal Grandfather   . Alcohol abuse Paternal Aunt   . Alcohol abuse Maternal Uncle     Social History:  Social History   Socioeconomic History  . Marital status: Married    Spouse name: [name]  . Number of children: 2  . Years of education: Associates  . Highest education level: Not on file  Occupational  History  . Occupation: Paralegal  Tobacco Use  . Smoking status: Former Smoker    Packs/day: 0.25    Types: Cigarettes    Quit date: 11/22/2018    Years since quitting: 2.1  . Smokeless tobacco: Never Used  Substance and Sexual Activity  . Alcohol use: Yes    Alcohol/week: 0.0 standard drinks    Comment: Usually drinks 2-3 bottles of wine daily when drinking.   . Drug use: No    Types: Cocaine, Marijuana    Comment: denies  . Sexual activity: Never    Birth control/protection: None  Other Topics Concern  . Not on file  Social History Narrative   Lives with husband and son. Daughter lives nearby. 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   Social Determinants of Health   Financial Resource Strain: Not on file  Food Insecurity: Not on file  Transportation Needs: Not on file  Physical Activity: Not on file  Stress: Not on file  Social Connections: Not on file  Intimate Partner Violence: Not on file    SDOH:  SDOH Screenings   Alcohol Screen: Not on file  Depression (PHQ2-9): Medium Risk  . PHQ-2 Score: 15  Financial Resource Strain: Not on file  Food Insecurity: Not on file  Housing: Not on file  Physical Activity: Not on file  Social Connections: Not on file  Stress: Not on file  Tobacco Use: Medium Risk  . Smoking Tobacco Use: Former Smoker  . Smokeless Tobacco Use: Never Used  Transportation Needs: Not on file    Last Labs:  Admission on 11/04/2020, Discharged on 11/05/2020  Component Date Value Ref Range Status  . Sodium 11/04/2020 142  135 - 145 mmol/L Final  . Potassium 11/04/2020 3.2* 3.5 - 5.1 mmol/L Final  . Chloride 11/04/2020 110  98 - 111 mmol/L Final  . CO2 11/04/2020 25  22 - 32 mmol/L Final  . Glucose, Bld 11/04/2020 101* 70 - 99 mg/dL Final   Glucose reference range applies only to samples taken after fasting for at least 8 hours.  . BUN 11/04/2020 7  6 - 20 mg/dL Final  . Creatinine, Ser 11/04/2020 0.51  0.44 - 1.00 mg/dL  Final  . Calcium 11/04/2020 8.7* 8.9 - 10.3 mg/dL Final  . Total Protein 11/04/2020 7.6  6.5 - 8.1 g/dL Final  . Albumin 11/04/2020 3.8  3.5 - 5.0 g/dL Final  . AST 11/04/2020 30  15 - 41 U/L Final  . ALT 11/04/2020 17  0 - 44 U/L Final  . Alkaline Phosphatase 11/04/2020 97  38 - 126 U/L Final  . Total Bilirubin 11/04/2020 0.6  0.3 - 1.2 mg/dL Final  . GFR, Estimated 11/04/2020 >60  >60 mL/min Final   Comment: (NOTE) Calculated using the CKD-EPI Creatinine Equation (2021)   . Anion gap 11/04/2020  7  5 - 15 Final   Performed at [facility] [address] [place], [place] [address]  . Alcohol, Ethyl (B) 11/04/2020 315* <10 mg/dL Final   Comment: CRITICAL RESULT CALLED TO, READ BACK BY AND VERIFIED WITH: SMITH,J. RN '@[id number]'  11/04/20 BILLINGSLEY,L (NOTE) Lowest detectable limit for serum alcohol is 10 mg/dL.  For medical purposes only. Performed at [facility], [place] [address], [place], [place] [address]   . WBC 11/04/2020 3.3* 4.0 - 10.5 K/uL Final  . RBC 11/04/2020 3.92  3.87 - 5.11 MIL/uL Final  . Hemoglobin 11/04/2020 11.2* 12.0 - 15.0 g/dL Final  . HCT 11/04/2020 34.4* 36.0 - 46.0 % Final  . MCV 11/04/2020 87.8  80.0 - 100.0 fL Final  . MCH 11/04/2020 28.6  26.0 - 34.0 pg Final  . MCHC 11/04/2020 32.6  30.0 - 36.0 g/dL Final  . RDW 11/04/2020 17.2* 11.5 - 15.5 % Final  . Platelets 11/04/2020 86* 150 - 400 K/uL Final   Comment: REPEATED TO VERIFY PLATELET COUNT CONFIRMED BY SMEAR SPECIMEN CHECKED FOR CLOTS Immature Platelet Fraction may be clinically indicated, consider ordering this additional test [id number]   . nRBC 11/04/2020 0.0  0.0 - 0.2 % Final   Performed at [facility] [address], [place], [place] [address]  . Opiates 11/04/2020 NONE DETECTED  NONE DETECTED Final  . Cocaine 11/04/2020 NONE DETECTED  NONE DETECTED Final  . Benzodiazepines 11/04/2020 NONE DETECTED  NONE DETECTED Final  . Amphetamines  11/04/2020 NONE DETECTED  NONE DETECTED Final  . Tetrahydrocannabinol 11/04/2020 NONE DETECTED  NONE DETECTED Final  . Barbiturates 11/04/2020 NONE DETECTED  NONE DETECTED Final   Comment: (NOTE) DRUG SCREEN FOR MEDICAL PURPOSES ONLY.  IF CONFIRMATION IS NEEDED FOR ANY PURPOSE, NOTIFY LAB WITHIN 5 DAYS.  LOWEST DETECTABLE LIMITS FOR URINE DRUG SCREEN Drug Class                     Cutoff (ng/mL) Amphetamine and metabolites    1000 Barbiturate and metabolites    200 Benzodiazepine                 100 Tricyclics and metabolites     300 Opiates and metabolites        300 Cocaine and metabolites        300 THC                            50 Performed at [facility], [place] [address] [place], [place] [address]   . I-stat hCG, quantitative 11/04/2020 <5.0  <5 mIU/mL Final  . Comment 3 11/04/2020          Final   Comment:   GEST. AGE      CONC.  (mIU/mL)   <=1 WEEK        5 - 50     2 WEEKS       50 - 500     3 WEEKS       100 - 10,000     4 WEEKS     1,000 - 30,000        FEMALE AND NON-PREGNANT FEMALE:     LESS THAN 5 mIU/mL   . SARS Coronavirus 2 by RT PCR 11/04/2020 NEGATIVE  NEGATIVE Final   Comment: (NOTE) SARS-CoV-2 target nucleic acids are NOT DETECTED.  The SARS-CoV-2 RNA is generally detectable in upper respiratory  specimens during the acute phase of infection. The lowest concentration of SARS-CoV-2 viral copies this assay can detect is 138 copies/mL. A negative result does not preclude SARS-Cov-2 infection and should not be used as the sole basis for treatment or other patient management decisions. A negative result may occur with  improper specimen collection/handling, submission of specimen other than nasopharyngeal swab, presence of viral mutation(s) within the areas targeted by this assay, and inadequate number of viral copies(<138 copies/mL). A negative result must be combined with clinical observations, patient history, and  epidemiological information. The expected result is Negative.  Fact Sheet for Patients:  EntrepreneurPulse.com.au  Fact Sheet for Healthcare Providers:  IncredibleEmployment.be  This test is no                          t yet approved or cleared by the [place] FDA and  has been authorized for detection and/or diagnosis of SARS-CoV-2 by FDA under an Emergency Use Authorization (EUA). This EUA will remain  in effect (meaning this test can be used) for the duration of the COVID-19 declaration under Section 564(b)(1) of the Act, 21 U.S.C.section 360bbb-3(b)(1), unless the authorization is terminated  or revoked sooner.      . Influenza A by PCR 11/04/2020 NEGATIVE  NEGATIVE Final  . Influenza B by PCR 11/04/2020 NEGATIVE  NEGATIVE Final   Comment: (NOTE) The Xpert Xpress SARS-CoV-2/FLU/RSV plus assay is intended as an aid in the diagnosis of influenza from Nasopharyngeal swab specimens and should not be used as a sole basis for treatment. Nasal washings and aspirates are unacceptable for Xpert Xpress SARS-CoV-2/FLU/RSV testing.  Fact Sheet for Patients: EntrepreneurPulse.com.au  Fact Sheet for Healthcare Providers: IncredibleEmployment.be  This test is not yet approved or cleared by the [place] FDA and has been authorized for detection and/or diagnosis of SARS-CoV-2 by FDA under an Emergency Use Authorization (EUA). This EUA will remain in effect (meaning this test can be used) for the duration of the COVID-19 declaration under Section 564(b)(1) of the Act, 21 U.S.C. section 360bbb-3(b)(1), unless the authorization is terminated or revoked.  Performed at [facility], [place] [address], [place], [place] [address]   Office Visit on 09/14/2020  Component Date Value Ref Range Status  . WBC 09/14/2020 3.1* 3.4 - 10.8 x10E3/uL Final  . RBC 09/14/2020 4.05  3.77 - 5.28 x10E6/uL  Final  . Hemoglobin 09/14/2020 11.7  11.1 - 15.9 g/dL Final  . Hematocrit 09/14/2020 35.0  34.0 - 46.6 % Final  . MCV 09/14/2020 86  79 - 97 fL Final  . MCH 09/14/2020 28.9  26.6 - 33.0 pg Final  . MCHC 09/14/2020 33.4  31.5 - 35.7 g/dL Final  . RDW 09/14/2020 17.8* 11.7 - 15.4 % Final  . Platelets 09/14/2020 75* 150 - 450 x10E3/uL Final   Comment: Actual platelet count may be somewhat higher than reported due to aggregation of platelets in this sample.   . Neutrophils 09/14/2020 53  Not Estab. % Final  . Lymphs 09/14/2020 37  Not Estab. % Final  . Monocytes 09/14/2020 9  Not Estab. % Final  . Eos 09/14/2020 1  Not Estab. % Final  . Basos 09/14/2020 0  Not Estab. % Final  . Neutrophils Absolute 09/14/2020 1.6  1.4 - 7.0 x10E3/uL Final  . Lymphocytes Absolute 09/14/2020 1.2  0.7 - 3.1 x10E3/uL Final  . Monocytes Absolute 09/14/2020 0.3  0.1 - 0.9 x10E3/uL Final  . EOS (ABSOLUTE)  09/14/2020 0.0  0.0 - 0.4 x10E3/uL Final  . Basophils Absolute 09/14/2020 0.0  0.0 - 0.2 x10E3/uL Final  . Immature Granulocytes 09/14/2020 0  Not Estab. % Final  . Immature Grans (Abs) 09/14/2020 0.0  0.0 - 0.1 x10E3/uL Final  . Hematology Comments: 09/14/2020 Note:   Final   Verified by microscopic examination.  . Total Protein 09/14/2020 7.5  6.0 - 8.5 g/dL Final  . Albumin 09/14/2020 3.9  3.8 - 4.8 g/dL Final  . Bilirubin Total 09/14/2020 0.7  0.0 - 1.2 mg/dL Final  . Bilirubin, Direct 09/14/2020 0.30  0.00 - 0.40 mg/dL Final  . Alkaline Phosphatase 09/14/2020 126* 44 - 121 IU/L Final                 **Please note reference interval change**  . AST 09/14/2020 40  0 - 40 IU/L Final  . ALT 09/14/2020 26  0 - 32 IU/L Final  . Glucose 09/14/2020 88  65 - 99 mg/dL Final  . BUN 09/14/2020 6  6 - 24 mg/dL Final  . Creatinine, Ser 09/14/2020 0.64  0.57 - 1.00 mg/dL Final  . GFR calc non Af Amer 09/14/2020 110  >59 mL/min/1.73 Final  . GFR calc Af Amer 09/14/2020 126  >59 mL/min/1.73 Final   Comment: **In  accordance with recommendations from the NKF-ASN Task force,**   [facility] is in the process of updating its eGFR calculation to the   2021 CKD-EPI creatinine equation that estimates kidney function   without a race variable.   . BUN/Creatinine Ratio 09/14/2020 9  9 - 23 Final  . Sodium 09/14/2020 137  134 - 144 mmol/L Final  . Potassium 09/14/2020 3.4* 3.5 - 5.2 mmol/L Final  . Chloride 09/14/2020 105  96 - 106 mmol/L Final  . CO2 09/14/2020 21  20 - 29 mmol/L Final  . Calcium 09/14/2020 8.6* 8.7 - 10.2 mg/dL Final  Admission on 07/27/2020, Discharged on 07/27/2020  Component Date Value Ref Range Status  . Alcohol, Ethyl (B) 07/27/2020 111* <10 mg/dL Final   Comment: (NOTE) Lowest detectable limit for serum alcohol is 10 mg/dL.  For medical purposes only. Performed at [facility], [place] [address] [place], [place] [address]   . WBC 07/27/2020 1.1* 4.0 - 10.5 K/uL Final   Comment: WHITE COUNT CONFIRMED ON SMEAR THIS CRITICAL RESULT HAS VERIFIED AND BEEN CALLED TO MILLNER,D. RN BY NICOLE MCCOY ON 09 06 2021 AT [place], AND HAS BEEN READ BACK. CRITICAL RESULT VERIFIED   . RBC 07/27/2020 3.37* 3.87 - 5.11 MIL/uL Final  . Hemoglobin 07/27/2020 10.0* 12.0 - 15.0 g/dL Final  . HCT 07/27/2020 30.9* 36.0 - 46.0 % Final  . MCV 07/27/2020 91.7  80.0 - 100.0 fL Final  . MCH 07/27/2020 29.7  26.0 - 34.0 pg Final  . MCHC 07/27/2020 32.4  30.0 - 36.0 g/dL Final  . RDW 07/27/2020 18.4* 11.5 - 15.5 % Final  . Platelets 07/27/2020 32* 150 - 400 K/uL Final   Comment: REPEATED TO VERIFY PLATELET COUNT CONFIRMED BY SMEAR SPECIMEN CHECKED FOR CLOTS Immature Platelet Fraction may be clinically indicated, consider ordering this additional test [id number]   . nRBC 07/27/2020 0.0  0.0 - 0.2 % Final  . Neutrophils Relative % 07/27/2020 29  % Final  . Neutro Abs 07/27/2020 0.3* 1.7 - 7.7 K/uL Final  . Lymphocytes Relative 07/27/2020 58  % Final  . Lymphs Abs 07/27/2020 0.7  0.7 - 4.0  K/uL Final  . Monocytes Relative  07/27/2020 11  % Final  . Monocytes Absolute 07/27/2020 0.1  0.1 - 1.0 K/uL Final  . Eosinophils Relative 07/27/2020 1  % Final  . Eosinophils Absolute 07/27/2020 0.0  0.0 - 0.5 K/uL Final  . Basophils Relative 07/27/2020 1  % Final  . Basophils Absolute 07/27/2020 0.0  0.0 - 0.1 K/uL Final  . Immature Granulocytes 07/27/2020 0  % Final  . Abs Immature Granulocytes 07/27/2020 0.00  0.00 - 0.07 K/uL Final  . Polychromasia 07/27/2020 PRESENT   Final   Performed at [facility], [place] [address], [place], [place] [address]  . Opiates 07/27/2020 NONE DETECTED  NONE DETECTED Final  . Cocaine 07/27/2020 NONE DETECTED  NONE DETECTED Final  . Benzodiazepines 07/27/2020 NONE DETECTED  NONE DETECTED Final  . Amphetamines 07/27/2020 NONE DETECTED  NONE DETECTED Final  . Tetrahydrocannabinol 07/27/2020 POSITIVE* NONE DETECTED Final  . Barbiturates 07/27/2020 NONE DETECTED  NONE DETECTED Final   Comment: (NOTE) DRUG SCREEN FOR MEDICAL PURPOSES ONLY.  IF CONFIRMATION IS NEEDED FOR ANY PURPOSE, NOTIFY LAB WITHIN 5 DAYS.  LOWEST DETECTABLE LIMITS FOR URINE DRUG SCREEN Drug Class                     Cutoff (ng/mL) Amphetamine and metabolites    1000 Barbiturate and metabolites    200 Benzodiazepine                 941 Tricyclics and metabolites     300 Opiates and metabolites        300 Cocaine and metabolites        300 THC                            50 Performed at [facility], [place] [address], [place], [place] [address]   . Sodium 07/27/2020 139  135 - 145 mmol/L Final  . Potassium 07/27/2020 3.7  3.5 - 5.1 mmol/L Final  . Chloride 07/27/2020 108  98 - 111 mmol/L Final  . CO2 07/27/2020 21* 22 - 32 mmol/L Final  . Glucose, Bld 07/27/2020 98  70 - 99 mg/dL Final   Glucose reference range applies only to samples taken after fasting for at least 8 hours.  . BUN 07/27/2020 6  6 - 20 mg/dL Final  . Creatinine, Ser 07/27/2020  0.42* 0.44 - 1.00 mg/dL Final  . Calcium 07/27/2020 8.3* 8.9 - 10.3 mg/dL Final  . Total Protein 07/27/2020 7.1  6.5 - 8.1 g/dL Final  . Albumin 07/27/2020 3.3* 3.5 - 5.0 g/dL Final  . AST 07/27/2020 80* 15 - 41 U/L Final  . ALT 07/27/2020 31  0 - 44 U/L Final  . Alkaline Phosphatase 07/27/2020 104  38 - 126 U/L Final  . Total Bilirubin 07/27/2020 1.2  0.3 - 1.2 mg/dL Final  . GFR calc non Af Amer 07/27/2020 >60  >60 mL/min Final  . GFR calc Af Amer 07/27/2020 >60  >60 mL/min Final  . Anion gap 07/27/2020 10  5 - 15 Final   Performed at [facility], [place] [address], [place], [place] [address]  . Path Review 07/27/2020 Reviewed by Kalman Drape, M.D.   Final   Comment: 9.7.2021 PANCYTOPENIA WITH NEUTROPENIA. Performed at [facility], [place] [address], [place],  [address]   . SARS Coronavirus 2 07/27/2020 NEGATIVE  NEGATIVE Final   Comment: (NOTE) SARS-CoV-2 target nucleic acids are NOT  DETECTED.  The SARS-CoV-2 RNA is generally detectable in upper and lower respiratory specimens during the acute phase of infection. The lowest concentration of SARS-CoV-2 viral copies this assay can detect is 250 copies / mL. A negative result does not preclude SARS-CoV-2 infection and should not be used as the sole basis for treatment or other patient management decisions.  A negative result may occur with improper specimen collection / handling, submission of specimen other than nasopharyngeal swab, presence of viral mutation(s) within the areas targeted by this assay, and inadequate number of viral copies (<250 copies / mL). A negative result must be combined with clinical observations, patient history, and epidemiological information.  Fact Sheet for Patients:   StrictlyIdeas.no  Fact Sheet for Healthcare Providers: BankingDealers.co.za  This test is not yet approved or                           cleared  by the [place] FDA and has been authorized for detection and/or diagnosis of SARS-CoV-2 by FDA under an Emergency Use Authorization (EUA).  This EUA will remain in effect (meaning this test can be used) for the duration of the COVID-19 declaration under Section 564(b)(1) of the Act, 21 U.S.C. section 360bbb-3(b)(1), unless the authorization is terminated or revoked sooner.  Performed at [facility], [place] [address], [place], [place] [address]     Allergies: Morphine and related and Nsaids  PTA Medications: (Not in a hospital admission)   Medical Decision Making  Admission orders placed  Patient is clinically intoxicated and will be placed in continuous assessment. Due to history of DTs, will initiate CIWA protocol.   Continue home medications -Lexapro 20 mg daily -Seroquel 100 mg at bedtime -Topamax 50 mg BID  Will not continue naltrexone or klonopin at this time  Initiate CIWA protocol -Ativan 1 mg taper -lorazepam 1 mg every 6 hours prn for CIWA >10 -thiamine 100 mg daily for nutritional supplementation -hydroxyzine 25 mg every 6 hours prn for anxiety, CIWA < or = 10 -ondansetron 4 mg ODT every 6 hours prn nausea/vomiting -loperamide 2-4 mg capsule prn diarrhea or loose stools    Clinical Course as of 01/21/21 0523  Wed Jan 20, 2021  2321 POCT Urine Drug Screen - ([facility]) UDS negative [JB]  Thu Jan 21, 2021  0229 WBC 3.3, Hgb 10.8, consistent with previous results [JB]  0230 Hemoglobin A1C: 5.2 [JB]  0521 Alcohol, Ethyl (B)(!!): 303 [JB]  0521 AST(!): 60 AST 60, ALT 24 Sodium 140, K+3.5 [JB]  0522 Hemoglobin A1C: 5.2 [JB]    Clinical Course User Index [JB] Rozetta Nunnery, NP    Recommendations  Based on my evaluation the patient does not appear to have an emergency medical condition.  Rozetta Nunnery, NP 01/20/21  9:38 PM

## 2021-01-20 NOTE — Progress Notes (Signed)
Pt is unsteady on her feet and has strong odor of alcohol about her person.  She has recent history of assaulting police and biting when she is intoxicated.

## 2021-01-20 NOTE — Progress Notes (Signed)
Received Cecila in the OBS ;after the admission labs and covid obtained. She was cooperative with the procedures. Her skin search was completed per protocol.She received her medications and nourishments. She endorsed feeling suicidal without a plan, depressed and anxious. The skin search was done with Amy, MHT.

## 2021-01-20 NOTE — BH Assessment (Signed)
TTS Triage:  Pt reporting suicidal ideation with no concrete plans or intent to follow through. Pt presents as intoxicated and keeps falling asleep throughout assessment. Pt is not violent--has a history of violence. Pt is calm throughout assessment.   Pt denies HI, AVH.  EMERGENT  Jeanmarie Plant, MSW, LCSW Outpatient Therapist/Triage Specialist

## 2021-01-21 ENCOUNTER — Other Ambulatory Visit (HOSPITAL_COMMUNITY): Payer: Self-pay | Admitting: Psychiatry

## 2021-01-21 LAB — CBC WITH DIFFERENTIAL/PLATELET
Abs Immature Granulocytes: 0.01 10*3/uL (ref 0.00–0.07)
Basophils Absolute: 0 10*3/uL (ref 0.0–0.1)
Basophils Relative: 0 %
Eosinophils Absolute: 0 10*3/uL (ref 0.0–0.5)
Eosinophils Relative: 1 %
HCT: 34.2 % — ABNORMAL LOW (ref 36.0–46.0)
Hemoglobin: 10.8 g/dL — ABNORMAL LOW (ref 12.0–15.0)
Immature Granulocytes: 0 %
Lymphocytes Relative: 49 %
Lymphs Abs: 1.5 10*3/uL (ref 0.7–4.0)
MCH: 27.1 pg (ref 26.0–34.0)
MCHC: 31.6 g/dL (ref 30.0–36.0)
MCV: 85.9 fL (ref 80.0–100.0)
Monocytes Absolute: 0.2 10*3/uL (ref 0.1–1.0)
Monocytes Relative: 5 %
Neutro Abs: 1.5 10*3/uL — ABNORMAL LOW (ref 1.7–7.7)
Neutrophils Relative %: 45 %
Platelets: 62 10*3/uL — ABNORMAL LOW (ref 150–400)
RBC: 3.98 MIL/uL (ref 3.87–5.11)
RDW: 15.4 % (ref 11.5–15.5)
WBC: 3.2 10*3/uL — ABNORMAL LOW (ref 4.0–10.5)
nRBC: 0 % (ref 0.0–0.2)

## 2021-01-21 LAB — COMPREHENSIVE METABOLIC PANEL
ALT: 24 U/L (ref 0–44)
AST: 60 U/L — ABNORMAL HIGH (ref 15–41)
Albumin: 3.3 g/dL — ABNORMAL LOW (ref 3.5–5.0)
Alkaline Phosphatase: 88 U/L (ref 38–126)
Anion gap: 11 (ref 5–15)
BUN: 7 mg/dL (ref 6–20)
CO2: 18 mmol/L — ABNORMAL LOW (ref 22–32)
Calcium: 8.4 mg/dL — ABNORMAL LOW (ref 8.9–10.3)
Chloride: 111 mmol/L (ref 98–111)
Creatinine, Ser: 0.42 mg/dL — ABNORMAL LOW (ref 0.44–1.00)
GFR, Estimated: >60 mL/min (ref >60)
Glucose, Bld: 126 mg/dL — ABNORMAL HIGH (ref 70–99)
Potassium: 3.5 mmol/L (ref 3.5–5.1)
Sodium: 140 mmol/L (ref 135–145)
Total Bilirubin: 0.9 mg/dL (ref 0.3–1.2)
Total Protein: 6.9 g/dL (ref 6.5–8.1)

## 2021-01-21 LAB — LIPID PANEL
Cholesterol: 154 mg/dL (ref 0–200)
HDL: 47 mg/dL (ref >40)
LDL Cholesterol: 83 mg/dL (ref 0–99)
Total CHOL/HDL Ratio: 3.3 RATIO
Triglycerides: 120 mg/dL (ref <150)
VLDL: 24 mg/dL (ref 0–40)

## 2021-01-21 LAB — HEMOGLOBIN A1C
Hgb A1c MFr Bld: 5.2 % (ref 4.8–5.6)
Mean Plasma Glucose: 102.54 mg/dL

## 2021-01-21 LAB — RESP PANEL BY RT-PCR (FLU A&B, COVID) ARPGX2
Influenza A by PCR: NEGATIVE
Influenza B by PCR: NEGATIVE
SARS Coronavirus 2 by RT PCR: NEGATIVE

## 2021-01-21 LAB — ETHANOL: Alcohol, Ethyl (B): 303 mg/dL (ref <10)

## 2021-01-21 LAB — TSH: TSH: 1.153 u[IU]/mL (ref 0.350–4.500)

## 2021-01-21 MED ORDER — TOPIRAMATE 25 MG PO TABS: 50.0000 mg | ORAL_TABLET | Freq: Two times a day (BID) | ORAL | Status: DC

## 2021-01-21 MED ORDER — TOPIRAMATE 100 MG PO TABS
100.0000 mg | ORAL_TABLET | Freq: Two times a day (BID) | ORAL | Status: DC
  Administered 2021-01-21: 100 mg via ORAL
  Filled 2021-01-21: qty 1

## 2021-01-21 MED ORDER — TOPIRAMATE 100 MG PO TABS: 100.0000 mg | ORAL_TABLET | Freq: Two times a day (BID) | ORAL | 0 refills | Status: DC

## 2021-01-21 MED FILL — TOPIRAMATE 100 MG TABS: 100 | 30 days supply | Qty: 60 | Fill #0

## 2021-01-21 NOTE — ED Notes (Signed)
Pt discharged in no acute distress. Verbalized understanding of AVS instructions reviewed by RN. Safety maintained.

## 2021-01-21 NOTE — Discharge Instructions (Addendum)

## 2021-01-21 NOTE — ED Notes (Signed)
Pt sleeping but easily aroused to name being called. Denies SI/HI/AVH currently. Denies concerns or needs. Informed pt to notify staff with any needs or concerns. Safety maintained.

## 2021-01-21 NOTE — ED Notes (Signed)
Pt resting in no acute distress. RR even and unlabored. Safety maintained. 

## 2021-01-21 NOTE — ED Provider Notes (Signed)
FBC/OBS ASAP Discharge Summary  Date and Time: 01/21/2021 7:58 AM  Name: Sara [name]  MRN:  [id number]   Discharge Diagnoses:  Final diagnoses:  Alcohol use with uncomplicated intoxication with moderate or severe use disorder ([place])  Bipolar I disorder, most recent episode depressed ([place])    Subjective: Patient reports today that she is feeling better.  She states that yesterday she was drinking alcohol and got into an argument with her husband.  She states that he was making threats about kicking her out of the house.  She states that they got into a altercation and the police were called.  She reports she does not really remember exactly how much or what she was drinking.  Patient reports that she relapsed approximately 1 week ago and states that this was due to having some "highs" that she reports as having manic symptoms.  She states that she is compliant with her medications and last taking Lexapro, Seroquel, Topamax, Ativan, and naltrexone.  She reports that she sees Eddie Nwoko at the [facility]. She denies any suicidal or homicidal ideations and denies any hallucinations. She reports that she has not spoken to any family members and is unsure if she can return home. She is refusing any residential substance abuse treatment. Patient is able to return home with husband. There were no safety concerns reported.  Stay Summary: Patient is a 45 year old female presented to the [facility] accompanied by law enforcement but presented voluntarily.  Patient has a history of bipolar disorder, GAD, PTSD, and alcohol use disorder.  Patient is followed by [name] PA-C at the [facility] for outpatient.  Patient presented intoxicated and had a BAL of 303.  Patient had reported drinking 1-1/2 bottles of wine however had reported to TTS staff that she had drank 4/5 of liquor.  Due to patient's intoxication as well as reporting suicidal ideations while intoxicated she was admitted to the continuous observation unit for  overnight assessment.  Patient was restarted on her home medications.  Patient denies any suicidal homicidal ideations and denies any hallucinations.  Patient reports that she relapsed on alcohol approximately 1 week ago because she was having some hypomanic symptoms.  The patient reports that she felt that her manic symptoms need to be controlled better which would assist her with preventing to drink alcohol.  After discussing her medications and agreed to increase her Topamax to 100 mg p.o. twice daily.  Patient was provided with a 30-day prescription for this medication change.  Patient will discharge home with her husband and there are no safety concerns.  Patient is instructed to follow-up with her provider at the [facility].  Patient's provider at the [facility] has been notified of the medication change and that the patient was admitted here overnight.  Total Time spent with patient: 30 minutes  Past Psychiatric History: MDD, alcoholism Past Medical History:  Past Medical History:  Diagnosis Date  . Alcohol abuse   . Alcoholism ([place])   . Anemia   . Anxiety   . Blood transfusion without reported diagnosis   . Cirrhosis ([place])   . Depression   . 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 GERD (gastroesophageal reflux disease)   . Heart murmur    Patient states she may have  . Menorrhagia   . Pancytopenia ([place]) 01/15/2014  . Pneumonia   . Portal hypertension ([place])   . S/P alcohol detoxification    2-3 days at behavioral health previously  . UGI bleed  06/12/2014    Past Surgical History:  Procedure Laterality Date  . CHOLECYSTECTOMY    . ESOPHAGOGASTRODUODENOSCOPY N/A 06/12/2014   Procedure: ESOPHAGOGASTRODUODENOSCOPY (EGD);  Surgeon: Gatha Mayer, MD;  Location: [facility];  Service: Endoscopy;  Laterality: N/A;  . ESOPHAGOGASTRODUODENOSCOPY (EGD) WITH PROPOFOL N/A 07/29/2014   Procedure: ESOPHAGOGASTRODUODENOSCOPY (EGD) WITH PROPOFOL;  Surgeon: Inda Castle, MD;  Location: [facility]  ENDOSCOPY;  Service: Endoscopy;  Laterality: N/A;  . ESOPHAGOGASTRODUODENOSCOPY (EGD) WITH PROPOFOL N/A 01/20/2018   Procedure: ESOPHAGOGASTRODUODENOSCOPY (EGD) WITH PROPOFOL;  Surgeon: Mauri Pole, MD;  Location: [facility] ENDOSCOPY;  Service: Endoscopy;  Laterality: N/A;   Family History:  Family History  Problem Relation Age of Onset  . Colon polyps Mother   . Hypertension Mother   . Thyroid disease Mother   . Alcoholism Mother   . Alcoholism Father   . Alcohol abuse Maternal Grandfather   . Alcohol abuse Paternal Grandfather   . Alcohol abuse Paternal Aunt   . Alcohol abuse Maternal Uncle    Family Psychiatric History: See above Social History:  Social History   Substance and Sexual Activity  Alcohol Use Yes  . Alcohol/week: 0.0 standard drinks   Comment: Usually drinks 2-3 bottles of wine daily when drinking.      Social History   Substance and Sexual Activity  Drug Use No  . Types: Cocaine, Marijuana   Comment: denies    Social History   Socioeconomic History  . Marital status: Married    Spouse name: [name]  . Number of children: 2  . Years of education: Associates  . Highest education level: Not on file  Occupational History  . Occupation: Paralegal  Tobacco Use  . Smoking status: Former Smoker    Packs/day: 0.25    Types: Cigarettes    Quit date: 11/22/2018    Years since quitting: 2.1  . Smokeless tobacco: Never Used  Substance and Sexual Activity  . Alcohol use: Yes    Alcohol/week: 0.0 standard drinks    Comment: Usually drinks 2-3 bottles of wine daily when drinking.   . Drug use: No    Types: Cocaine, Marijuana    Comment: denies  . Sexual activity: Never    Birth control/protection: None  Other Topics Concern  . Not on file  Social History Narrative   Lives with husband and son. Daughter lives nearby. 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   Social Determinants of Health   Financial Resource Strain: Not on file   Food Insecurity: Not on file  Transportation Needs: Not on file  Physical Activity: Not on file  Stress: Not on file  Social Connections: Not on file   SDOH:  SDOH Screenings   Alcohol Screen: Not on file  Depression (PHQ2-9): Medium Risk  . PHQ-2 Score: 15  Financial Resource Strain: Not on file  Food Insecurity: Not on file  Housing: Not on file  Physical Activity: Not on file  Social Connections: Not on file  Stress: Not on file  Tobacco Use: Medium Risk  . Smoking Tobacco Use: Former Smoker  . Smokeless Tobacco Use: Never Used  Transportation Needs: Not on file    Has this patient used any form of tobacco in the last 30 days? (Cigarettes, Smokeless Tobacco, Cigars, and/or Pipes) A prescription for an FDA-approved tobacco cessation medication was offered at discharge and the patient refused  Current Medications:  Current Facility-Administered Medications  Medication Dose Route Frequency Provider Last Rate Last Admin  . acetaminophen (  TYLENOL) tablet 650 mg  650 mg Oral Q6H PRN Rozetta Nunnery, NP      . alum & mag hydroxide-simeth (MAALOX/MYLANTA) 200-200-20 MG/5ML suspension 30 mL  30 mL Oral Q4H PRN [name] A, NP      . escitalopram (LEXAPRO) tablet 20 mg  20 mg Oral Daily [name] A, NP      . hydrOXYzine (ATARAX/VISTARIL) tablet 25 mg  25 mg Oral Q6H PRN [name] A, NP      . loperamide (IMODIUM) capsule 2-4 mg  2-4 mg Oral PRN [name] A, NP      . LORazepam (ATIVAN) tablet 1 mg  1 mg Oral Q6H PRN [name] A, NP      . magnesium hydroxide (MILK OF MAGNESIA) suspension 30 mL  30 mL Oral Daily PRN [name] A, NP      . multivitamin with minerals tablet 1 tablet  1 tablet Oral Daily [name] A, NP   1 tablet at 01/20/21 2255  . ondansetron (ZOFRAN-ODT) disintegrating tablet 4 mg  4 mg Oral Q6H PRN [name] A, NP      . QUEtiapine (SEROQUEL) tablet 100 mg  100 mg Oral QHS [name] A, NP   100 mg at 01/20/21 2256  . thiamine tablet 100  mg  100 mg Oral Daily [name] A, NP   100 mg at 01/20/21 2256  . topiramate (TOPAMAX) tablet 100 mg  100 mg Oral BID Pandora Mccrackin, Lowry Ram, FNP       Current Outpatient Medications  Medication Sig Dispense Refill  . clonazePAM (KLONOPIN) 0.5 MG tablet Take 1 tablet (0.5 mg total) by mouth 2 (two) times daily as needed for anxiety. 60 tablet 0  . escitalopram (LEXAPRO) 20 MG tablet Take 1 tablet (20 mg total) by mouth daily. 30 tablet 1  . naltrexone (DEPADE) 50 MG tablet Take 1 tablet (50 mg total) by mouth daily. 30 tablet 1  . QUEtiapine (SEROQUEL) 100 MG tablet Take 1 tablet (100 mg total) by mouth at bedtime. 30 tablet 1  . topiramate (TOPAMAX) 50 MG tablet Take 1 tablet (50 mg total) by mouth 2 (two) times daily. 60 tablet 1    PTA Medications: (Not in a hospital admission)   Musculoskeletal  Strength & Muscle Tone: within normal limits Gait & Station: normal Patient leans: N/A  Psychiatric Specialty Exam  Presentation  General Appearance: Disheveled  Eye Contact:Good  Speech:Clear and Coherent; Normal Rate  Speech Volume:Decreased  Handedness:Right   Mood and Affect  Mood:Euthymic  Affect:Appropriate; Congruent   Thought Process  Thought Processes:Coherent  Descriptions of Associations:Intact  Orientation:Full (Time, Place and Person)  Thought Content:WDL  Hallucinations:Hallucinations: None  Ideas of Reference:None  Suicidal Thoughts:Suicidal Thoughts: No SI Active Intent and/or Plan: Without Intent; Without Plan  Homicidal Thoughts:Homicidal Thoughts: No   Sensorium  Memory:Immediate Fair; Recent Good; Remote Good  Judgment:Good  Insight:Fair   Executive Functions  Concentration:Good  Attention Span:Good  [place] of Knowledge:Good  Language:Good   Psychomotor Activity  Psychomotor Activity:Psychomotor Activity: Normal   Assets  Assets:Communication Skills; Desire for Improvement; Housing; Social Support   Sleep   Sleep:Sleep: Good   Nutritional Assessment (For OBS and [facility] admissions only) Has the patient had a weight loss or gain of 10 pounds or more in the last 3 months?: No Has the patient had a decrease in food intake/or appetite?: No Does the patient have dental problems?: No Does the patient have eating habits or  behaviors that may be indicators of an eating disorder including binging or inducing vomiting?: No Has the patient recently lost weight without trying?: No Has the patient been eating poorly because of a decreased appetite?: No Malnutrition Screening Tool Score: 0    Physical Exam  Physical Exam Vitals and nursing note reviewed.  Constitutional:      Appearance: She is well-developed.  HENT:     Head: Normocephalic.  Eyes:     Pupils: Pupils are equal, round, and reactive to light.  Cardiovascular:     Rate and Rhythm: Normal rate.  Pulmonary:     Effort: Pulmonary effort is normal.  Musculoskeletal:        General: Normal range of motion.  Neurological:     Mental Status: She is alert and oriented to person, place, and time.    Review of Systems  Constitutional: Negative.   HENT: Negative.   Eyes: Negative.   Respiratory: Negative.   Cardiovascular: Negative.   Gastrointestinal: Negative.   Genitourinary: Negative.   Musculoskeletal: Negative.   Skin: Negative.   Neurological: Negative.   Endo/Heme/Allergies: Negative.   Psychiatric/Behavioral: Positive for substance abuse.   Blood pressure 108/63, pulse 100, temperature 97.7 F (36.5 C), temperature source Oral, resp. rate 16, SpO2 95 %. There is no height or weight on file to calculate BMI.  Demographic Factors:  Caucasian and Unemployed  Loss Factors: NA  Historical Factors: NA  Risk Reduction Factors:   Sense of responsibility to family, Living with another person, especially a relative, Positive social support and Positive therapeutic relationship  Continued Clinical Symptoms:   Alcohol/Substance Abuse/Dependencies Previous Psychiatric Diagnoses and Treatments  Cognitive Features That Contribute To Risk:  None    Suicide Risk:  Minimal: No identifiable suicidal ideation.  Patients presenting with no risk factors but with morbid ruminations; may be classified as minimal risk based on the severity of the depressive symptoms  Plan Of Care/Follow-up recommendations:  Continue activity as tolerated. Continue diet as recommended by your PCP. Ensure to keep all appointments with outpatient providers.  Disposition: Discharge home with husband   Lewis Shock, FNP 01/21/2021, 7:58 AM

## 2021-01-21 NOTE — ED Notes (Signed)
Breakfast given.  

## 2021-01-21 NOTE — Progress Notes (Signed)
Sara [name] remains sleeping in her chair bed without distress. Lab reported a BAL of 300. Corene Cornea, NP notified.

## 2021-01-25 ENCOUNTER — Emergency Department (HOSPITAL_COMMUNITY)
Admission: EM | Admit: 2021-01-25 | Discharge: 2021-01-25 | Disposition: A | Discharge status: Home / Self Care | Payer: Medicaid Other | Attending: Emergency Medicine | Admitting: Emergency Medicine

## 2021-01-25 ENCOUNTER — Other Ambulatory Visit: Payer: Self-pay

## 2021-01-25 DIAGNOSIS — F1920 Other psychoactive substance dependence, uncomplicated: Secondary | ICD-10-CM | POA: Insufficient documentation

## 2021-01-25 DIAGNOSIS — Z5321 Procedure and treatment not carried out due to patient leaving prior to being seen by health care provider: Secondary | ICD-10-CM | POA: Insufficient documentation

## 2021-01-25 LAB — COMPREHENSIVE METABOLIC PANEL
ALT: 21 U/L (ref 0–44)
AST: 37 U/L (ref 15–41)
Albumin: 3.9 g/dL (ref 3.5–5.0)
Alkaline Phosphatase: 90 U/L (ref 38–126)
Anion gap: 13 (ref 5–15)
BUN: <5 mg/dL — ABNORMAL LOW (ref 6–20)
CO2: 18 mmol/L — ABNORMAL LOW (ref 22–32)
Calcium: 9 mg/dL (ref 8.9–10.3)
Chloride: 111 mmol/L (ref 98–111)
Creatinine, Ser: 1.42 mg/dL — ABNORMAL HIGH (ref 0.44–1.00)
GFR, Estimated: 47 mL/min — ABNORMAL LOW (ref >60)
Glucose, Bld: 108 mg/dL — ABNORMAL HIGH (ref 70–99)
Potassium: 3.3 mmol/L — ABNORMAL LOW (ref 3.5–5.1)
Sodium: 142 mmol/L (ref 135–145)
Total Bilirubin: 1.3 mg/dL — ABNORMAL HIGH (ref 0.3–1.2)
Total Protein: 8.3 g/dL — ABNORMAL HIGH (ref 6.5–8.1)

## 2021-01-25 LAB — CBC
HCT: 36.9 % (ref 36.0–46.0)
Hemoglobin: 12.3 g/dL (ref 12.0–15.0)
MCH: 28.4 pg (ref 26.0–34.0)
MCHC: 33.3 g/dL (ref 30.0–36.0)
MCV: 85.2 fL (ref 80.0–100.0)
Platelets: 91 10*3/uL — ABNORMAL LOW (ref 150–400)
RBC: 4.33 MIL/uL (ref 3.87–5.11)
RDW: 16.8 % — ABNORMAL HIGH (ref 11.5–15.5)
WBC: 4.4 10*3/uL (ref 4.0–10.5)
nRBC: 0 % (ref 0.0–0.2)

## 2021-01-25 LAB — ETHANOL: Alcohol, Ethyl (B): <10 mg/dL (ref <10)

## 2021-01-25 LAB — I-STAT BETA HCG BLOOD, ED (MC, WL, AP ONLY): I-stat hCG, quantitative: <5 m[IU]/mL (ref <5)

## 2021-01-25 LAB — ACETAMINOPHEN LEVEL: Acetaminophen (Tylenol), Serum: <10 ug/mL — ABNORMAL LOW (ref 10–30)

## 2021-01-25 LAB — SALICYLATE LEVEL: Salicylate Lvl: <7 mg/dL — ABNORMAL LOW (ref 7.0–30.0)

## 2021-01-25 NOTE — ED Notes (Signed)
Called pt x3 and no answer.

## 2021-01-25 NOTE — ED Triage Notes (Signed)
Pt via EMS from therapists office where she presented under the influence of unknown substance, although it is thought to be her prescription Clonazepam. Initially combative.

## 2021-01-25 NOTE — ED Notes (Signed)
Pt has five Clonazepam tablets with her, repeatedly trying to take more in front of EMS. Meds taken and counted in triage.

## 2021-02-01 ENCOUNTER — Inpatient Hospital Stay: Payer: Medicaid Other | Attending: Hematology and Oncology

## 2021-02-01 ENCOUNTER — Other Ambulatory Visit: Payer: Self-pay

## 2021-02-01 DIAGNOSIS — D61818 Other pancytopenia: Secondary | ICD-10-CM | POA: Insufficient documentation

## 2021-02-01 LAB — CBC WITH DIFFERENTIAL (CANCER CENTER ONLY)
Abs Immature Granulocytes: 0.01 10*3/uL (ref 0.00–0.07)
Basophils Absolute: 0 10*3/uL (ref 0.0–0.1)
Basophils Relative: 1 %
Eosinophils Absolute: 0 10*3/uL (ref 0.0–0.5)
Eosinophils Relative: 1 %
HCT: 34 % — ABNORMAL LOW (ref 36.0–46.0)
Hemoglobin: 11 g/dL — ABNORMAL LOW (ref 12.0–15.0)
Immature Granulocytes: 1 %
Lymphocytes Relative: 41 %
Lymphs Abs: 0.7 10*3/uL (ref 0.7–4.0)
MCH: 27.2 pg (ref 26.0–34.0)
MCHC: 32.4 g/dL (ref 30.0–36.0)
MCV: 84.2 fL (ref 80.0–100.0)
Monocytes Absolute: 0.2 10*3/uL (ref 0.1–1.0)
Monocytes Relative: 10 %
Neutro Abs: 0.8 10*3/uL — ABNORMAL LOW (ref 1.7–7.7)
Neutrophils Relative %: 46 %
Platelet Count: 63 10*3/uL — ABNORMAL LOW (ref 150–400)
RBC: 4.04 MIL/uL (ref 3.87–5.11)
RDW: 15.9 % — ABNORMAL HIGH (ref 11.5–15.5)
WBC Count: 1.8 10*3/uL — ABNORMAL LOW (ref 4.0–10.5)
nRBC: 0 % (ref 0.0–0.2)

## 2021-02-01 LAB — CMP (CANCER CENTER ONLY)
ALT: 18 U/L (ref 0–44)
AST: 31 U/L (ref 15–41)
Albumin: 3.6 g/dL (ref 3.5–5.0)
Alkaline Phosphatase: 83 U/L (ref 38–126)
Anion gap: 6 (ref 5–15)
BUN: 9 mg/dL (ref 6–20)
CO2: 22 mmol/L (ref 22–32)
Calcium: 8.7 mg/dL — ABNORMAL LOW (ref 8.9–10.3)
Chloride: 108 mmol/L (ref 98–111)
Creatinine: 0.68 mg/dL (ref 0.44–1.00)
GFR, Estimated: >60 mL/min
Glucose, Bld: 87 mg/dL (ref 70–99)
Potassium: 3.2 mmol/L — ABNORMAL LOW (ref 3.5–5.1)
Sodium: 136 mmol/L (ref 135–145)
Total Bilirubin: 1.1 mg/dL (ref 0.3–1.2)
Total Protein: 7.6 g/dL (ref 6.5–8.1)

## 2021-02-01 LAB — VITAMIN B12: Vitamin B-12: 420 pg/mL (ref 180–914)

## 2021-02-01 LAB — IRON AND TIBC
Iron: 29 ug/dL — ABNORMAL LOW (ref 41–142)
Saturation Ratios: 6 % — ABNORMAL LOW (ref 21–57)
TIBC: 526 ug/dL — ABNORMAL HIGH (ref 236–444)
UIBC: 497 ug/dL — ABNORMAL HIGH (ref 120–384)

## 2021-02-01 LAB — FERRITIN: Ferritin: 7 ng/mL — ABNORMAL LOW (ref 11–307)

## 2021-02-01 LAB — FOLATE: Folate: 9.1 ng/mL (ref >5.9)

## 2021-02-02 NOTE — Assessment & Plan Note (Deleted)
Pancytopeniadue to alcohol abuse Leukopenia: Primarily neutropenia  Workup: 1. CBC with differential09/25/2018: WBC 2.7, ANC 1.5, hemoglobin 10, platelets 69 2. O-64 and folic acid levels: Normal 3. ANA: Negative 4. Flow cytometry: No monoclonalB orT-cell aberrant populations identified 5.Iron studies iron saturation 6%, TIBC 510, ferritin 95. (Given IV iron October 2018, Dec 2020)  Recommendation: Bone marrow biopsy on 09/26/2017: Slightly hypercellular(50-70%)bone marrow for age with trilineage hematopoiesis, mild plasmacytosis 4% The pancytopenia is directly attributable to her alcoholism and cirrhosis of the liver.  Lab review:02/01/21: Ferritin 7, Iron sat 6%, TIBC 526, B 12 420, Hb 11, WBC 1.8, Platelet 63, ANC 0.8  I once again reiterated the importance of alcohol cessation. She has cut down the alcohol use significantly according to her.  She only drinks 1 to 2 days a week instead of every day of the week.  Return to clinic in 1 year overall labs done ahead of time.

## 2021-02-03 ENCOUNTER — Inpatient Hospital Stay: Payer: Medicaid Other | Admitting: Hematology and Oncology

## 2021-02-03 DIAGNOSIS — D61818 Other pancytopenia: Secondary | ICD-10-CM

## 2021-02-17 ENCOUNTER — Other Ambulatory Visit: Payer: Self-pay

## 2021-02-17 ENCOUNTER — Other Ambulatory Visit (HOSPITAL_COMMUNITY): Payer: Self-pay | Admitting: Physician Assistant

## 2021-02-17 ENCOUNTER — Telehealth (INDEPENDENT_AMBULATORY_CARE_PROVIDER_SITE_OTHER): Payer: No Payment, Other | Admitting: Physician Assistant

## 2021-02-17 ENCOUNTER — Encounter (HOSPITAL_COMMUNITY): Payer: Self-pay | Admitting: Physician Assistant

## 2021-02-17 DIAGNOSIS — F1010 Alcohol abuse, uncomplicated: Secondary | ICD-10-CM

## 2021-02-17 DIAGNOSIS — F411 Generalized anxiety disorder: Secondary | ICD-10-CM | POA: Diagnosis not present

## 2021-02-17 DIAGNOSIS — F319 Bipolar disorder, unspecified: Secondary | ICD-10-CM | POA: Diagnosis not present

## 2021-02-17 MED ORDER — ESCITALOPRAM OXALATE 10 MG PO TABS: 30.0000 mg | ORAL_TABLET | Freq: Every day | ORAL | 1 refills | Status: DC

## 2021-02-17 MED ORDER — CLONAZEPAM 0.5 MG PO TABS: 0.5000 mg | ORAL_TABLET | Freq: Two times a day (BID) | ORAL | 0 refills | Status: DC | PRN

## 2021-02-17 NOTE — Progress Notes (Signed)
BH MD/PA/NP OP Progress Note  Virtual Visit via Telephone Note  I connected with Sara [name] on 02/18/21 at  4:00 PM EDT by telephone and verified that I am speaking with the correct person using two identifiers.  Location: Patient: Home Provider: Clinic   I discussed the limitations, risks, security and privacy concerns of performing an evaluation and management service by telephone and the availability of in person appointments. I also discussed with the patient that there may be a patient responsible charge related to this service. The patient expressed understanding and agreed to proceed.  Follow Up Instructions:  I discussed the assessment and treatment plan with the patient. The patient was provided an opportunity to ask questions and all were answered. The patient agreed with the plan and demonstrated an understanding of the instructions.   The patient was advised to call back or seek an in-person evaluation if the symptoms worsen or if the condition fails to improve as anticipated.  I provided 33 minutes of non-face-to-face time during this encounter.   Sara Mood, PA   02/18/2021 4:34 AM Sara [name]  MRN:  [id number]  Chief Complaint: Follow up and medication management  HPI:   Celiac G. Large is a 45 year old female with a past psychiatric history significant for bipolar 1 disorder, generalized anxiety disorder, and alcohol abuse who presents to [facility] via virtual telephone visit for follow-up and medication management.  Patient is currently being managed on the following medications:  Naltrexone 50 mg daily Quetiapine 100 mg at bedtime Escitalopram 20 mg daily Topiramate 50 mg 3 times daily Klonopin 0.5 mg 2 times daily as needed  Patient reports that she has been doing well and states that over the past 2 weeks her [name] has been getting better.  Patient attributes the improvement in her [name] to  walking more and being in nature.  Patient states that she was recently seen at [facility] after worsening manic episodes.  During that time, patient reports that she was acting out of character, not sleeping at night, and exhibiting erratic behavior such as calling friends from [place] in the middle of the night.  In an attempt to balance out her [name], patient states that she drank alcohol which was not helpful.  While assessed at [facility], patient states her topiramate dosage was changed from 50 mg 2 times daily to 200 mg daily.    Patient reports that she recently went back on her dose after feeling like a zombie on such a high dose.  Patient expresses that she still experiences anxiety and endorses panic when arguing or hearing bad news.  Patient reports that she still taking Klonopin and states that it has been helpful in the management of her anxiety and racing thoughts.  Patient is interested in options for managing her anxiety.  Patient denies irritability or [name] swings.  Patient has no other issues or concerns.  A PHQ 9 was performed with the patient scoring of 15.  A GAD-7 screen was also performed with the patient scoring an 11.  Patient is pleasant, calm, cooperative, and fully engaged in conversation during the encounter.  Patient reports that she feels anxious and nervous but is mostly situational.  She does note that she is feeling nervous about her son going off on his own on a trip to [place].  Patient denies suicidal or homicidal ideations.  She further denies auditory or visual hallucinations.  Patient endorses good sleep  and receives on average 8 to 10 hours of sleep each night.  Patient endorses good appetite and eats on average 2 meals per day.  Patient denies alcohol consumption, tobacco use, and illicit drug use.  Visit Diagnosis:    ICD-10-CM   1. Bipolar 1 disorder (HCC)  F31.9 escitalopram (LEXAPRO) 10 MG tablet  2. Generalized anxiety disorder  F41.1  clonazePAM (KLONOPIN) 0.5 MG tablet  3. Alcohol abuse  F10.10     Past Psychiatric History:  Generalized Anxiety Disorder Bipolar Disorder PTSD  Past Medical History:  Past Medical History:  Diagnosis Date  . Alcohol abuse   . Alcoholism ([place])   . Anemia   . Anxiety   . Blood transfusion without reported diagnosis   . Cirrhosis ([place])   . Depression   . 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 GERD (gastroesophageal reflux disease)   . Heart murmur    Patient states she may have  . Menorrhagia   . Pancytopenia ([place]) 01/15/2014  . Pneumonia   . Portal hypertension ([place])   . S/P alcohol detoxification    2-3 days at behavioral health previously  . UGI bleed 06/12/2014    Past Surgical History:  Procedure Laterality Date  . CHOLECYSTECTOMY    . ESOPHAGOGASTRODUODENOSCOPY N/A 06/12/2014   Procedure: ESOPHAGOGASTRODUODENOSCOPY (EGD);  Surgeon: Gatha Mayer, MD;  Location: [facility];  Service: Endoscopy;  Laterality: N/A;  . ESOPHAGOGASTRODUODENOSCOPY (EGD) WITH PROPOFOL N/A 07/29/2014   Procedure: ESOPHAGOGASTRODUODENOSCOPY (EGD) WITH PROPOFOL;  Surgeon: Inda Castle, MD;  Location: [facility] END[facility]COPY;  Service: Endoscopy;  Laterality: N/A;  . ESOPHAGOGASTRODUODENOSCOPY (EGD) WITH PROPOFOL N/A 01/20/2018   Procedure: ESOPHAGOGASTRODUODENOSCOPY (EGD) WITH PROPOFOL;  Surgeon: Mauri Pole, MD;  Location: [facility] ENDOSCOPY;  Service: Endoscopy;  Laterality: N/A;    Family Psychiatric History: None  Family History:  Family History  Problem Relation Age of Onset  . Colon polyps Mother   . Hypertension Mother   . Thyroid disease Mother   . Alcoholism Mother   . Alcoholism Father   . Alcohol abuse Maternal Grandfather   . Alcohol abuse Paternal Grandfather   . Alcohol abuse Paternal Aunt   . Alcohol abuse Maternal Uncle     Social History:  Social History   Socioeconomic History  . Marital status: Married    Spouse name: Sara [name]  . Number of children: 2   . Years of education: Associates  . Highest education level: Not on file  Occupational History  . Occupation: Paralegal  Tobacco Use  . Smoking status: Former Smoker    Packs/day: 0.25    Types: Cigarettes    Quit date: 11/22/2018    Years since quitting: 2.2  . Smokeless tobacco: Never Used  Substance and Sexual Activity  . Alcohol use: Yes    Alcohol/week: 0.0 standard drinks    Comment: Usually drinks 2-3 bottles of wine daily when drinking.   . Drug use: No    Types: Cocaine, Marijuana    Comment: denies  . Sexual activity: Never    Birth control/protection: None  Other Topics Concern  . Not on file  Social History Narrative   Lives with husband and son. Daughter lives nearby. 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   Social Determinants of Health   Financial Resource Strain: Not on file  Food Insecurity: Not on file  Transportation Needs: Not on file  Physical Activity: Not on file  Stress: Not on file  Social Connections: Not on file  Allergies:  Allergies  Allergen Reactions  . Morphine And Related Other (See Comments)    Slowed HR, lowered BP  . Nsaids Other (See Comments)    Caused internal bleeding    Metabolic Disorder Labs: Lab Results  Component Value Date   HGBA1C 5.2 01/20/2021   MPG 102.54 01/20/2021   MPG 85.32 04/21/2019   Lab Results  Component Value Date   PROLACTIN 28.7 (H) 05/04/2017   Lab Results  Component Value Date   CHOL 154 01/20/2021   TRIG 120 01/20/2021   HDL 47 01/20/2021   CHOLHDL 3.3 01/20/2021   VLDL 24 01/20/2021   LDLCALC 83 01/20/2021   LDLCALC 76 04/21/2019   Lab Results  Component Value Date   TSH 1.153 01/20/2021   TSH 3.658 06/24/2020    Therapeutic Level Labs: No results found for: LITHIUM No results found for: VALPROATE No components found for:  CBMZ  Current Medications: Current Outpatient Medications  Medication Sig Dispense Refill  . [START ON 03/09/2021] clonazePAM  (KLONOPIN) 0.5 MG tablet Take 1 tablet (0.5 mg total) by mouth 2 (two) times daily as needed for anxiety. 60 tablet 0  . escitalopram (LEXAPRO) 10 MG tablet Take 3 tablets (30 mg total) by mouth daily. 90 tablet 1  . naltrexone (DEPADE) 50 MG tablet Take 1 tablet (50 mg total) by mouth daily. 30 tablet 1  . QUEtiapine (SEROQUEL) 100 MG tablet Take 1 tablet (100 mg total) by mouth at bedtime. 30 tablet 1  . topiramate (TOPAMAX) 100 MG tablet Take 1 tablet (100 mg total) by mouth 2 (two) times daily. 60 tablet 0   No current facility-administered medications for this visit.     Musculoskeletal: Strength & Muscle Tone: Unable to assess due to telemedicine visit [place]: Unable to assess due to telemedicine visit Patient leans: Unable to assess due to telemedicine visit  Psychiatric Specialty Exam: Review of Systems  Psychiatric/Behavioral: Negative for agitation, decreased concentration, dysphoric [name], hallucinations, self-injury, sleep disturbance and suicidal ideas. The patient is nervous/anxious. The patient is not hyperactive.     There were no vitals taken for this visit.There is no height or weight on file to calculate BMI.  General Appearance: Unable to assess due to telemedicine visit  Eye Contact:  Unable to assess due to telemedicine visit  Speech:  Clear and Coherent and Normal Rate  Volume:  Normal  [name]:  Anxious and Euthymic  Affect:  Appropriate  Thought Process:  Coherent, Goal Directed and Descriptions of Associations: Intact  Orientation:  Full (Time, Place, and Person)  Thought Content: WDL and Logical   Suicidal Thoughts:  No  Homicidal Thoughts:  No  Memory:  Immediate;   Good Recent;   Good Remote;   Good  Judgement:  Good  Insight:  Fair  Psychomotor Activity:  Normal  Concentration:  Concentration: Good and Attention Span: Good  Recall:  Good  Fund of Knowledge: Good  Language: Good  Akathisia:  NA  Handed:  Right  AIMS (if indicated): not done   Assets:  Communication Skills Desire for Improvement Housing Social Support  ADL's:  Intact  Cognition: WNL  Sleep:  Good   Screenings: AIMS   Flowsheet Row Admission (Discharged) from 04/19/2019 in [facility] Admission (Discharged) from 05/02/2017 in [facility]  AIMS Total Score 0 0    AUDIT   Flowsheet Row Admission (Discharged) from 04/19/2019 in [facility] Admission (Discharged)  from 05/02/2017 in [facility] Admission (Discharged) from 12/22/2014 in [facility] Admission (Discharged) from 02/02/2014 in [facility] Admission (Discharged) from 09/27/2013 in [facility]  Alcohol Use Disorder Identification Test Final Score (AUDIT) 34 34 29 38 34    GAD-7   Flowsheet Row Video Visit from 02/17/2021 in [facility] Video Visit from 01/06/2021 in [facility] Video Visit from 12/04/2020 in [facility] Video Visit from 07/24/2019 in [facility]  Total GAD-7 Score 11 17 15 2     PHQ2-9   Flowsheet Row Video Visit from 02/17/2021 in [facility] ED from 01/20/2021 in [facility] Video Visit from 01/06/2021 in [facility] Video Visit from 12/04/2020 in [facility] Office Visit from 09/14/2020 in [place]  PHQ-2 Total Score 4 1 5 4  0  PHQ-9 Total Score 15 15 15 19 4     Flowsheet Row Video Visit from 02/17/2021 in [facility] ED from 01/25/2021 in [facility] ED from 01/20/2021 in [facility] No Risk No Risk Low  Risk       Assessment and Plan:  Celiac G. Macaulay is a 45 year old female with a past psychiatric history significant for bipolar 1 disorder, generalized anxiety disorder, and alcohol abuse who presents to [facility] via virtual telephone visit for follow-up and medication management.  Patient reports that she was recently seen at [facility] after experiencing worsening manic episodes.  During her assessment, patient's topiramate was changed from 50 mg 2 times daily to 200 mg daily.  Patient reports that she has since reverted back to her original dose of topiramate due to feeling like a zombie on such a high dose.  Patient expresses that she still experiences anxiety and racing thoughts but states that Klonopin has been helpful.  Patient is interested in other options to manage her anxiety.  Patient was recommended increasing her escitalopram dose from 20 mg to 30 mg for the management of her anxiety.  Patient is agreeable to recommendation.  Patient's medications will be e-prescribed to pharmacy of choice.  1. Bipolar 1 disorder ([place]) Patient to continue taking Seroquel 100 mg at bedtime for the management of her bipolar disorder  - escitalopram (LEXAPRO) 10 MG tablet; Take 3 tablets (30 mg total) by mouth daily.  Dispense: 90 tablet; Refill: 1  2. Generalized anxiety disorder  - clonazePAM (KLONOPIN) 0.5 MG tablet; Take 1 tablet (0.5 mg total) by mouth 2 (two) times daily as needed for anxiety.  Dispense: 60 tablet; Refill: 0  3. Alcohol abuse Patient to continue taking naltrexone 50 mg daily for the management of her alcohol abuse  Patient to follow-up in 6 weeks  Sara Mood, PA 02/18/2021, 4:34 AM

## 2021-02-18 MED FILL — QUETIAPINE FUMARATE 100 MG: 100 | 30 days supply | Qty: 30 | Fill #1

## 2021-02-18 MED FILL — NALTREXONE 50 MG TABLET: 50 | 30 days supply | Qty: 30 | Fill #1

## 2021-02-18 MED FILL — ESCITALOPRAM 10 MG TABLET: 10 | 30 days supply | Qty: 90 | Fill #0

## 2021-02-20 ENCOUNTER — Other Ambulatory Visit: Payer: Self-pay

## 2021-03-24 ENCOUNTER — Other Ambulatory Visit: Payer: Self-pay

## 2021-03-24 ENCOUNTER — Other Ambulatory Visit (HOSPITAL_COMMUNITY): Payer: Self-pay | Admitting: Physician Assistant

## 2021-03-24 DIAGNOSIS — F319 Bipolar disorder, unspecified: Secondary | ICD-10-CM

## 2021-03-24 MED ORDER — QUETIAPINE FUMARATE 100 MG PO TABS
ORAL_TABLET | ORAL | 0 refills | Status: DC
  Filled 2021-03-24: qty 30, 30d supply, fill #0

## 2021-03-24 MED FILL — Topiramate Tab 100 MG: ORAL | 30 days supply | Qty: 60 | Fill #0 | Status: AC

## 2021-03-24 MED FILL — Escitalopram Oxalate Tab 10 MG (Base Equiv): ORAL | 30 days supply | Qty: 90 | Fill #0 | Status: AC

## 2021-03-24 NOTE — Telephone Encounter (Signed)
Message received from Rickey Barbara, CPhT at [facility]. Provider approves the medication request.

## 2021-03-25 ENCOUNTER — Other Ambulatory Visit: Payer: Self-pay

## 2021-04-02 ENCOUNTER — Other Ambulatory Visit: Payer: Self-pay

## 2021-04-02 ENCOUNTER — Telehealth (INDEPENDENT_AMBULATORY_CARE_PROVIDER_SITE_OTHER): Payer: No Payment, Other | Admitting: Physician Assistant

## 2021-04-02 DIAGNOSIS — F319 Bipolar disorder, unspecified: Secondary | ICD-10-CM

## 2021-04-02 DIAGNOSIS — F1010 Alcohol abuse, uncomplicated: Secondary | ICD-10-CM

## 2021-04-02 DIAGNOSIS — F411 Generalized anxiety disorder: Secondary | ICD-10-CM | POA: Diagnosis not present

## 2021-04-02 MED ORDER — GABAPENTIN 100 MG PO CAPS
100.0000 mg | ORAL_CAPSULE | Freq: Three times a day (TID) | ORAL | 1 refills | Status: DC
  Filled 2021-04-02 – 2021-06-08 (×4): qty 90, 30d supply, fill #0

## 2021-04-02 MED ORDER — CLONAZEPAM 0.5 MG PO TABS
0.5000 mg | ORAL_TABLET | Freq: Two times a day (BID) | ORAL | 0 refills | Status: DC | PRN
Start: 2021-04-08 — End: 2021-05-13

## 2021-04-02 MED ORDER — TOPIRAMATE 50 MG PO TABS
50.0000 mg | ORAL_TABLET | Freq: Two times a day (BID) | ORAL | 1 refills | Status: DC
  Filled 2021-04-02: qty 60, 30d supply, fill #0

## 2021-04-02 MED ORDER — ESCITALOPRAM OXALATE 10 MG PO TABS
ORAL_TABLET | ORAL | 1 refills | Status: DC
  Filled 2021-04-02: qty 90, fill #0
  Filled 2021-05-07: qty 90, 30d supply, fill #0

## 2021-04-02 MED ORDER — QUETIAPINE FUMARATE 100 MG PO TABS
ORAL_TABLET | Freq: Every day | ORAL | 1 refills | Status: DC
  Filled 2021-04-02: qty 30, fill #0
  Filled 2021-04-22: qty 30, 30d supply, fill #0

## 2021-04-02 NOTE — Progress Notes (Signed)
BH MD/PA/NP OP Progress Note  Virtual Visit via Video Note  I connected with Sara [name] on 04/02/21 at  4:00 PM EDT by a video enabled telemedicine application and verified that I am speaking with the correct person using two identifiers.  Location: Patient: Home Provider: Clinic   I discussed the limitations of evaluation and management by telemedicine and the availability of in person appointments. The patient expressed understanding and agreed to proceed.  Follow Up Instructions:   I discussed the assessment and treatment plan with the patient. The patient was provided an opportunity to ask questions and all were answered. The patient agreed with the plan and demonstrated an understanding of the instructions.   The patient was advised to call back or seek an in-person evaluation if the symptoms worsen or if the condition fails to improve as anticipated.  I provided 24 minutes of non-face-to-face time during this encounter.  [name], PA   04/02/2021 9:29 PM Jaselle Pryer  MRN:  [id number]  Chief Complaint: Follow-up and medication management  HPI:   Sara [name] is a 45 year old female with a past history significant for alcohol abuse disorder, bipolar 1 disorder, and generalized anxiety disorder who presents to [facility] via virtual video visit for follow-up and medication management.  Patient is currently being managed on the following medications:  Seroquel 100 mg at bedtime Escitalopram 30 mg daily Klonopin 0.5 mg 3 times daily as needed Topiramate 50 mg 2 times daily  Patient reports no issues or concerns regarding her current medication regimen.  Patient denies a need for dosage adjustments at this time and is requesting refills on all her medications.  Patient reports that her medications are working perfectly together and that her sleep has improved.  Since adjusting her escitalopram during the  last encounter, patient has seen some improvement in her anxiety but states that it is still a bit of an issue.  Patient has also noticed a decrease in her appetite but states that she still tries to force herself to eat.  A PHQ-9 screen was performed with the patient scoring a 15.  A GAD-7 screen was also performed with the patient scoring a 15.  Patient is pleasant, calm, cooperative, and fully engaged in conversation during the encounter.  Patient reports that she feels a little anxious and irritable.  Patient denies suicidal or homicidal ideations.  She further denies auditory or visual hallucinations and does not appear to be responding to internal/external stimuli.  Patient endorses good sleep and receives on average 9 hours of sleep each night.  Patient endorses decreased appetite but states that she has on average 2 meals per day.  Patient denies alcohol consumption, tobacco use, and illicit drug use.  Visit Diagnosis:    ICD-10-CM   1. Bipolar 1 disorder (HCC)  F31.9 QUEtiapine (SEROQUEL) 100 MG tablet    topiramate (TOPAMAX) 50 MG tablet    escitalopram (LEXAPRO) 10 MG tablet  2. Generalized anxiety disorder  F41.1 gabapentin (NEURONTIN) 100 MG capsule    clonazePAM (KLONOPIN) 0.5 MG tablet  3. Alcohol abuse  F10.10     Past Psychiatric History:  Generalized Anxiety Disorder Bipolar Disorder PTSD  Past Medical History:  Past Medical History:  Diagnosis Date  . Alcohol abuse   . Alcoholism ([place])   . Anemia   . Anxiety   . Blood transfusion without reported diagnosis   . Cirrhosis ([place])   . Depression   . Esophageal  varices with bleeding(456.0) 06/13/2014  . GERD (gastroesophageal reflux disease)   . Heart murmur    Patient states she may have  . Menorrhagia   . Pancytopenia ([place]) 01/15/2014  . Pneumonia   . Portal hypertension ([place])   . S/P alcohol detoxification    2-3 days at behavioral health previously  . UGI bleed 06/12/2014    Past Surgical History:  Procedure  Laterality Date  . CHOLECYSTECTOMY    . ESOPHAGOGASTRODUODENOSCOPY N/A 06/12/2014   Procedure: ESOPHAGOGASTRODUODENOSCOPY (EGD);  Surgeon: Gatha Mayer, MD;  Location: [facility];  Service: Endoscopy;  Laterality: N/A;  . ESOPHAGOGASTRODUODENOSCOPY (EGD) WITH PROPOFOL N/A 07/29/2014   Procedure: ESOPHAGOGASTRODUODENOSCOPY (EGD) WITH PROPOFOL;  Surgeon: Inda Castle, MD;  Location: [facility];  Service: Endoscopy;  Laterality: N/A;  . ESOPHAGOGASTRODUODENOSCOPY (EGD) WITH PROPOFOL N/A 01/20/2018   Procedure: ESOPHAGOGASTRODUODENOSCOPY (EGD) WITH PROPOFOL;  Surgeon: Mauri Pole, MD;  Location: [facility];  Service: Endoscopy;  Laterality: N/A;    Family Psychiatric History:  None  Family History:  Family History  Problem Relation Age of Onset  . Colon polyps Mother   . Hypertension Mother   . Thyroid disease Mother   . Alcoholism Mother   . Alcoholism Father   . Alcohol abuse Maternal Grandfather   . Alcohol abuse Paternal Grandfather   . Alcohol abuse Paternal Aunt   . Alcohol abuse Maternal Uncle     Social History:  Social History   Socioeconomic History  . Marital status: Married    Spouse name: Sara [name]  . Number of children: 2  . Years of education: Associates  . Highest education level: Not on file  Occupational History  . Occupation: Paralegal  Tobacco Use  . Smoking status: Former Smoker    Packs/day: 0.25    Types: Cigarettes    Quit date: 11/22/2018    Years since quitting: 2.3  . Smokeless tobacco: Never Used  Substance and Sexual Activity  . Alcohol use: Yes    Alcohol/week: 0.0 standard drinks    Comment: Usually drinks 2-3 bottles of wine daily when drinking.   . Drug use: No    Types: Cocaine, Marijuana    Comment: denies  . Sexual activity: Never    Birth control/protection: None  Other Topics Concern  . Not on file  Social History Narrative   Lives with husband and son. Daughter lives nearby. Has worked at a [employer].   Social Determinants of Health   Financial Resource Strain: Not on file  Food Insecurity: Not on file  Transportation Needs: Not on file  Physical Activity: Not on file  Stress: Not on file  Social Connections: Not on file    Allergies:  Allergies  Allergen Reactions  . Morphine And Related Other (See Comments)    Slowed HR, lowered BP  . Nsaids Other (See Comments)    Caused internal bleeding    Metabolic Disorder Labs: Lab Results  Component Value Date   HGBA1C 5.2 01/20/2021   MPG 102.54 01/20/2021   MPG 85.32 04/21/2019   Lab Results  Component Value Date   PROLACTIN 28.7 (H) 05/04/2017   Lab Results  Component Value Date   CHOL 154 01/20/2021   TRIG 120 01/20/2021   HDL 47 01/20/2021   CHOLHDL 3.3 01/20/2021   VLDL 24 01/20/2021   LDLCALC 83 01/20/2021   LDLCALC 76 04/21/2019   Lab Results  Component Value Date   TSH 1.153 01/20/2021   TSH 3.658  06/24/2020    Therapeutic Level Labs: No results found for: LITHIUM No results found for: VALPROATE No components found for:  CBMZ  Current Medications: Current Outpatient Medications  Medication Sig Dispense Refill  . gabapentin (NEURONTIN) 100 MG capsule Take 1 capsule (100 mg total) by mouth 3 (three) times daily. 90 capsule 1  . [START ON 04/08/2021] clonazePAM (KLONOPIN) 0.5 MG tablet Take 1 tablet (0.5 mg total) by mouth 2 (two) times daily as needed for anxiety. 60 tablet 0  . escitalopram (LEXAPRO) 10 MG tablet TAKE 3 TABLETS (30 MG TOTAL) BY MOUTH DAILY. 90 tablet 1  . naltrexone (DEPADE) 50 MG tablet TAKE 1 TABLET BY MOUTH DAILY. 30 tablet 0  . QUEtiapine (SEROQUEL) 100 MG tablet TAKE 1 TABLET (100 MG TOTAL) BY MOUTH AT BEDTIME. 30 tablet 1  . topiramate (TOPAMAX) 50 MG tablet Take 1 tablet (50 mg total) by mouth 2 (two) times daily. 60 tablet 1   No current facility-administered medications for this visit.     Musculoskeletal: Strength & Muscle Tone: Unable to  assess due to telemedicine visit [place]: Unable to assess due to telemedicine visit Patient leans: Unable to assess due to telemedicine visit  Psychiatric Specialty Exam: Review of Systems  Psychiatric/Behavioral: Negative for decreased concentration, dysphoric mood, hallucinations, self-injury, sleep disturbance and suicidal ideas. The patient is nervous/anxious. The patient is not hyperactive.     There were no vitals taken for this visit.There is no height or weight on file to calculate BMI.  General Appearance: Fairly Groomed  Eye Contact:  Good  Speech:  Clear and Coherent and Normal Rate  Volume:  Normal  Mood:  Anxious and Euthymic  Affect:  Appropriate and Congruent  Thought Process:  Coherent, Goal Directed and Descriptions of Associations: Intact  Orientation:  Full (Time, Place, and Person)  Thought Content: WDL   Suicidal Thoughts:  No  Homicidal Thoughts:  No  Memory:  Immediate;   Good Recent;   Good Remote;   Good  Judgement:  Good  Insight:  Fair  Psychomotor Activity:  Normal  Concentration:  Concentration: Good and Attention Span: Good  Recall:  Good  Fund of Knowledge: Good  Language: Good  Akathisia:  NA  Handed:  Right  AIMS (if indicated): not done  Assets:  Communication Skills Desire for Improvement Housing Social Support  ADL's:  Intact  Cognition: WNL  Sleep:  Good   Screenings: AIMS   Flowsheet Row Admission (Discharged) from 04/19/2019 in [facility] Admission (Discharged) from 05/02/2017 in [facility]  AIMS Total Score 0 0    AUDIT   Flowsheet Row Admission (Discharged) from 04/19/2019 in [facility] Admission (Discharged) from 05/02/2017 in [facility] Admission (Discharged) from 12/22/2014 in [facility] Admission (Discharged) from 02/02/2014 in [facility] Admission (Discharged) from 09/27/2013 in [facility]  Alcohol Use Disorder Identification Test Final Score (AUDIT) 34 34 29 38 34    GAD-7   Flowsheet Row Video Visit from 04/02/2021 in [facility] Video Visit from 02/17/2021 in [facility] Video Visit from 01/06/2021 in [facility] Video Visit from 12/04/2020 in [facility] Video Visit from 07/24/2019 in [place]  Total GAD-7 Score 15 11 17 15 2     PHQ2-9  Flowsheet Row Video Visit from 04/02/2021 in [facility] Video Visit from 02/17/2021 in [facility] ED from 01/20/2021 in [facility] Video Visit from 01/06/2021 in [facility] Video Visit from 12/04/2020 in [facility]  PHQ-2 Total Score 3 4 1 5 4   PHQ-9 Total Score 15 15 15 15 19     Flowsheet Row Video Visit from 04/02/2021 in [facility] Video Visit from 02/17/2021 in [facility] ED from 01/25/2021 in [facility] No Risk No Risk No Risk       Assessment and Plan:   JANISSA BERTRAM is a 45 year old female with a past history significant for alcohol abuse disorder, bipolar 1 disorder, and generalized anxiety disorder who presents to [facility] via virtual video visit for follow-up and medication management. Patient reports no issues or concerns with her current medication regimen.  Patient denies the need for dosage adjustments at this time and is requesting refills on all her medications.  Patient still reports experiencing some anxiety and irritability.  Patient was recommended gabapentin 100 mg 3 times daily as  needed for the management of her anxiety.  Patient's medications will be e-prescribed to pharmacy of choice.  1. Bipolar 1 disorder (HCC)  - QUEtiapine (SEROQUEL) 100 MG tablet; TAKE 1 TABLET (100 MG TOTAL) BY MOUTH AT BEDTIME.  Dispense: 30 tablet; Refill: 1 - topiramate (TOPAMAX) 50 MG tablet; Take 1 tablet (50 mg total) by mouth 2 (two) times daily.  Dispense: 60 tablet; Refill: 1 - escitalopram (LEXAPRO) 10 MG tablet; TAKE 3 TABLETS (30 MG TOTAL) BY MOUTH DAILY.  Dispense: 90 tablet; Refill: 1  2. Generalized anxiety disorder  - gabapentin (NEURONTIN) 100 MG capsule; Take 1 capsule (100 mg total) by mouth 3 (three) times daily.  Dispense: 90 capsule; Refill: 1 - clonazePAM (KLONOPIN) 0.5 MG tablet; Take 1 tablet (0.5 mg total) by mouth 2 (two) times daily as needed for anxiety.  Dispense: 60 tablet; Refill: 0  3. Alcohol abuse  Patient to follow-up in 6 weeks  [name], PA 04/02/2021, 9:29 PM

## 2021-04-04 ENCOUNTER — Encounter (HOSPITAL_COMMUNITY): Payer: Self-pay | Admitting: Physician Assistant

## 2021-04-05 ENCOUNTER — Other Ambulatory Visit: Payer: Self-pay

## 2021-04-12 ENCOUNTER — Other Ambulatory Visit: Payer: Self-pay

## 2021-04-20 ENCOUNTER — Other Ambulatory Visit: Payer: Self-pay

## 2021-04-22 ENCOUNTER — Encounter (HOSPITAL_COMMUNITY): Payer: Self-pay | Admitting: Emergency Medicine

## 2021-04-22 ENCOUNTER — Emergency Department (HOSPITAL_COMMUNITY)
Admission: EM | Admit: 2021-04-22 | Discharge: 2021-04-22 | Disposition: A | Discharge status: Home / Self Care | Payer: Medicaid Other | Attending: Emergency Medicine | Admitting: Emergency Medicine

## 2021-04-22 ENCOUNTER — Other Ambulatory Visit: Payer: Self-pay

## 2021-04-22 ENCOUNTER — Encounter: Payer: Self-pay | Admitting: Hematology and Oncology

## 2021-04-22 DIAGNOSIS — Z20822 Contact with and (suspected) exposure to covid-19: Secondary | ICD-10-CM | POA: Insufficient documentation

## 2021-04-22 DIAGNOSIS — Y908 Blood alcohol level of 240 mg/100 ml or more: Secondary | ICD-10-CM | POA: Insufficient documentation

## 2021-04-22 DIAGNOSIS — W19XXXA Unspecified fall, initial encounter: Secondary | ICD-10-CM

## 2021-04-22 DIAGNOSIS — F10129 Alcohol abuse with intoxication, unspecified: Secondary | ICD-10-CM | POA: Insufficient documentation

## 2021-04-22 DIAGNOSIS — F10920 Alcohol use, unspecified with intoxication, uncomplicated: Secondary | ICD-10-CM

## 2021-04-22 DIAGNOSIS — Z87891 Personal history of nicotine dependence: Secondary | ICD-10-CM | POA: Insufficient documentation

## 2021-04-22 LAB — COMPREHENSIVE METABOLIC PANEL
ALT: 21 U/L (ref 0–44)
AST: 31 U/L (ref 15–41)
Albumin: 3.7 g/dL (ref 3.5–5.0)
Alkaline Phosphatase: 95 U/L (ref 38–126)
Anion gap: 9 (ref 5–15)
BUN: 9 mg/dL (ref 6–20)
CO2: 20 mmol/L — ABNORMAL LOW (ref 22–32)
Calcium: 9.5 mg/dL (ref 8.9–10.3)
Chloride: 115 mmol/L — ABNORMAL HIGH (ref 98–111)
Creatinine, Ser: 0.86 mg/dL (ref 0.44–1.00)
GFR, Estimated: >60 mL/min (ref >60)
Glucose, Bld: 104 mg/dL — ABNORMAL HIGH (ref 70–99)
Potassium: 3.6 mmol/L (ref 3.5–5.1)
Sodium: 144 mmol/L (ref 135–145)
Total Bilirubin: 0.6 mg/dL (ref 0.3–1.2)
Total Protein: 7.4 g/dL (ref 6.5–8.1)

## 2021-04-22 LAB — CBC
HCT: 37 % (ref 36.0–46.0)
Hemoglobin: 11.6 g/dL — ABNORMAL LOW (ref 12.0–15.0)
MCH: 27.8 pg (ref 26.0–34.0)
MCHC: 31.4 g/dL (ref 30.0–36.0)
MCV: 88.5 fL (ref 80.0–100.0)
Platelets: 96 10*3/uL — ABNORMAL LOW (ref 150–400)
RBC: 4.18 MIL/uL (ref 3.87–5.11)
RDW: 17.5 % — ABNORMAL HIGH (ref 11.5–15.5)
WBC: 3.7 10*3/uL — ABNORMAL LOW (ref 4.0–10.5)
nRBC: 0 % (ref 0.0–0.2)

## 2021-04-22 LAB — ETHANOL: Alcohol, Ethyl (B): 276 mg/dL — ABNORMAL HIGH (ref <10)

## 2021-04-22 LAB — RAPID URINE DRUG SCREEN, HOSP PERFORMED
Amphetamines: NOT DETECTED
Barbiturates: NOT DETECTED
Benzodiazepines: NOT DETECTED
Cocaine: NOT DETECTED
Opiates: NOT DETECTED
Tetrahydrocannabinol: NOT DETECTED

## 2021-04-22 LAB — I-STAT BETA HCG BLOOD, ED (MC, WL, AP ONLY): I-stat hCG, quantitative: <5 m[IU]/mL (ref <5)

## 2021-04-22 LAB — ACETAMINOPHEN LEVEL: Acetaminophen (Tylenol), Serum: <10 ug/mL — ABNORMAL LOW (ref 10–30)

## 2021-04-22 LAB — RESP PANEL BY RT-PCR (FLU A&B, COVID) ARPGX2
Influenza A by PCR: NEGATIVE
Influenza B by PCR: NEGATIVE
SARS Coronavirus 2 by RT PCR: NEGATIVE

## 2021-04-22 LAB — SALICYLATE LEVEL: Salicylate Lvl: <7 mg/dL — ABNORMAL LOW (ref 7.0–30.0)

## 2021-04-22 MED ORDER — THIAMINE HCL 100 MG PO TABS
100.0000 mg | ORAL_TABLET | Freq: Every day | ORAL | Status: DC
  Filled 2021-04-22: qty 1

## 2021-04-22 MED ORDER — THIAMINE HCL 100 MG/ML IJ SOLN
100.0000 mg | Freq: Every day | INTRAMUSCULAR | Status: DC
  Administered 2021-04-22: 100 mg via INTRAVENOUS
  Filled 2021-04-22: qty 2

## 2021-04-22 MED ORDER — LORAZEPAM 1 MG PO TABS
0.0000 mg | ORAL_TABLET | Freq: Four times a day (QID) | ORAL | Status: DC
Start: 2021-04-22 — End: 2021-04-23
  Administered 2021-04-22: 2 mg via ORAL
  Filled 2021-04-22: qty 3

## 2021-04-22 MED ORDER — LORAZEPAM 1 MG PO TABS: 0.0000 mg | ORAL_TABLET | Freq: Two times a day (BID) | ORAL | Status: DC

## 2021-04-22 MED ORDER — LORAZEPAM 2 MG/ML IJ SOLN: 0.0000 mg | Freq: Two times a day (BID) | INTRAMUSCULAR | Status: DC

## 2021-04-22 MED ORDER — LORAZEPAM 2 MG/ML IJ SOLN
0.0000 mg | Freq: Four times a day (QID) | INTRAMUSCULAR | Status: DC
  Administered 2021-04-22: 1 mg via INTRAVENOUS
  Filled 2021-04-22: qty 1

## 2021-04-22 NOTE — Discharge Instructions (Addendum)
You were provided with outpatient resources in order to obtain further help for alcohol rehabilitation.  If you experience any chest pain, shortness of breath, worsening symptoms please return to the emergency department.
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
## 2021-04-22 NOTE — ED Triage Notes (Signed)
Per EMS-states daughter called EMS due to patient drinking hand sanitizer all day-slurred speech-fell in bedroom at home-patient states she is "going thru some things" complaining of nausea

## 2021-04-26 ENCOUNTER — Emergency Department (HOSPITAL_COMMUNITY)
Admission: EM | Admit: 2021-04-26 | Discharge: 2021-04-26 | Disposition: A | Discharge status: Home / Self Care | Payer: Self-pay | Attending: Emergency Medicine | Admitting: Emergency Medicine

## 2021-04-26 ENCOUNTER — Other Ambulatory Visit: Payer: Self-pay

## 2021-04-26 ENCOUNTER — Encounter: Payer: Self-pay | Admitting: Hematology and Oncology

## 2021-04-26 ENCOUNTER — Encounter (HOSPITAL_COMMUNITY): Payer: Self-pay

## 2021-04-26 ENCOUNTER — Emergency Department (HOSPITAL_COMMUNITY): Payer: Self-pay

## 2021-04-26 DIAGNOSIS — W010XXA Fall on same level from slipping, tripping and stumbling without subsequent striking against object, initial encounter: Secondary | ICD-10-CM | POA: Insufficient documentation

## 2021-04-26 DIAGNOSIS — Z87891 Personal history of nicotine dependence: Secondary | ICD-10-CM | POA: Insufficient documentation

## 2021-04-26 DIAGNOSIS — M5441 Lumbago with sciatica, right side: Secondary | ICD-10-CM | POA: Insufficient documentation

## 2021-04-26 LAB — POC URINE PREG, ED
Preg Test, Ur: NEGATIVE
Preg Test, Ur: NEGATIVE
Preg Test, Ur: NEGATIVE

## 2021-04-26 MED ORDER — LIDOCAINE 5 % EX PTCH
1.0000 | MEDICATED_PATCH | CUTANEOUS | Status: DC
  Administered 2021-04-26: 1 via TRANSDERMAL
  Filled 2021-04-26: qty 1

## 2021-04-26 MED ORDER — TRAMADOL HCL 50 MG PO TABS
50.0000 mg | ORAL_TABLET | Freq: Two times a day (BID) | ORAL | 0 refills | Status: DC | PRN
  Filled 2021-04-26: qty 10, 5d supply, fill #0

## 2021-04-26 NOTE — ED Notes (Signed)
Lidocaine patch applied to pt's lower back area (middle), advised pt to remove after 12hrs and to not put heating pad on patch or get patch wet, pt understood

## 2021-04-26 NOTE — ED Notes (Signed)
Gave pt dc papers, went over follow-up care w pcp, advised pt provider recommends to stop taking tylenol d/t alcohol intake and start taking tramadol- rx sent to pharmacy, advised pt to not drink with this medication. Pt understood, ride here to take her home, pt walks independently, steady gait, a&o x4

## 2021-04-26 NOTE — ED Notes (Signed)
Pt ambulated to restroom independently, steady gait

## 2021-04-26 NOTE — ED Triage Notes (Signed)
Patient arrived stating about 4 days ago she has been having lower back pain that radiates down her right leg after a fall. Taking Tylenol for pain, last dose at 3am with no relief.

## 2021-04-26 NOTE — Discharge Instructions (Addendum)
Your CT scan of your back did not show any fractures or other abnormalities. Please pick up prescription and take as prescribed. It is recommended that you discontinue using Tylenol given your alcohol intake. DO NOT DRINK ALCOHOL WHILE TAKING THIS MEDICATION.   Follow up with your PCP this week for further evaluation.   Return to the ED for any new/worsening symptoms
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
## 2021-05-03 ENCOUNTER — Other Ambulatory Visit: Payer: Self-pay

## 2021-05-07 ENCOUNTER — Other Ambulatory Visit: Payer: Self-pay

## 2021-05-07 ENCOUNTER — Encounter: Payer: Self-pay | Admitting: Hematology and Oncology

## 2021-05-13 ENCOUNTER — Other Ambulatory Visit: Payer: Self-pay

## 2021-05-13 ENCOUNTER — Encounter (HOSPITAL_COMMUNITY): Payer: Self-pay | Admitting: Physician Assistant

## 2021-05-13 ENCOUNTER — Encounter: Payer: Self-pay | Admitting: Hematology and Oncology

## 2021-05-13 ENCOUNTER — Telehealth (INDEPENDENT_AMBULATORY_CARE_PROVIDER_SITE_OTHER): Payer: No Payment, Other | Admitting: Physician Assistant

## 2021-05-13 DIAGNOSIS — F319 Bipolar disorder, unspecified: Secondary | ICD-10-CM | POA: Diagnosis not present

## 2021-05-13 DIAGNOSIS — F411 Generalized anxiety disorder: Secondary | ICD-10-CM

## 2021-05-13 MED ORDER — ESCITALOPRAM OXALATE 10 MG PO TABS
ORAL_TABLET | ORAL | 1 refills | Status: DC
  Filled 2021-05-13: qty 90, fill #0
  Filled 2021-06-08: qty 30, 30d supply, fill #0
  Filled 2021-06-22: qty 30, 30d supply, fill #1
  Filled 2021-06-23: qty 90, 30d supply, fill #1

## 2021-05-13 MED ORDER — QUETIAPINE FUMARATE 100 MG PO TABS
ORAL_TABLET | Freq: Every day | ORAL | 1 refills | Status: DC
  Filled 2021-05-13: qty 30, fill #0
  Filled 2021-05-21: qty 30, 30d supply, fill #0
  Filled 2021-06-22: qty 30, 30d supply, fill #1

## 2021-05-13 MED ORDER — TOPIRAMATE 50 MG PO TABS
100.0000 mg | ORAL_TABLET | Freq: Two times a day (BID) | ORAL | 1 refills | Status: DC
  Filled 2021-05-13 – 2021-05-21 (×2): qty 45, 30d supply, fill #0
  Filled 2021-06-08: qty 45, 30d supply, fill #1
  Filled 2021-06-08: qty 45, 11d supply, fill #1

## 2021-05-13 MED ORDER — CLONAZEPAM 0.5 MG PO TABS: 0.5000 mg | ORAL_TABLET | Freq: Two times a day (BID) | ORAL | 0 refills | Status: DC | PRN

## 2021-05-13 NOTE — Progress Notes (Signed)
BH MD/PA/NP OP Progress Note  Virtual Visit via Video Note  I connected with Sara [name] on 05/13/21 at  2:30 PM EDT by a video enabled telemedicine application and verified that I am speaking with the correct person using two identifiers.  Location: Patient: Home Provider: Clinic   I discussed the limitations of evaluation and management by telemedicine and the availability of in person appointments. The patient expressed understanding and agreed to proceed.  Follow Up Instructions:   I discussed the assessment and treatment plan with the patient. The patient was provided an opportunity to ask questions and all were answered. The patient agreed with the plan and demonstrated an understanding of the instructions.   The patient was advised to call back or seek an in-person evaluation if the symptoms worsen or if the condition fails to improve as anticipated.  I provided 27 minutes of non-face-to-face time during this encounter.  [name], PA    05/13/2021 2:59 PM Sara [name]  MRN:  [id number]  Chief Complaint: Follow up and medication management  HPI:   Sara [name]. Freshour is a 45 year old female with a past psychiatric history significant for alcohol abuse disorder, bipolar 1 disorder, and generalized anxiety disorder who presents to [facility] via virtual video visit for follow-up and medication management.  Patient is currently being managed on the following medications:  Seroquel 100 mg at bedtime Topiramate 50 mg 2 times daily Lexapro 30 mg daily Gabapentin 100 mg 3 times daily Clonazepam 0.5 mg 2 times daily as needed  Patient reports that she has not started her gabapentin yet due to not picking it up at her pharmacy.  Patient also expresses that she is concerned about having more instances of feeling manic.  Patient endorses hyperactivity, constantly getting up to do things, racing thoughts, and the  inability to sleep.  Patient denies irritability.  Patient states that she tries to calm herself down through relaxing, meditation, and going on long walks but it has not been helpful.  The only thing that has been able to help the patient with her manic episodes has been taking an extra dose of topiramate at night.  Patient expresses that her medications have been helpful but feels that her manic episodes can be better managed.  Patient is pleasant, calm, cooperative, and fully engaged in conversation during the encounter.  Patient reports that her mood is okay.  Patient denies suicidal or homicidal ideations.  She further denies auditory or visual hallucinations and does not appear to be responding to internal/external stimuli.  Patient endorses good sleep and receives on average 6 hours of sleep each night.  Patient endorses good appetite and eats on average 3 meals per day.  Patient denies alcohol consumption, tobacco use, and illicit drug use.  Visit Diagnosis:    ICD-10-CM   1. Bipolar 1 disorder (HCC)  F31.9 topiramate (TOPAMAX) 50 MG tablet    escitalopram (LEXAPRO) 10 MG tablet    QUEtiapine (SEROQUEL) 100 MG tablet    2. Generalized anxiety disorder  F41.1 clonazePAM (KLONOPIN) 0.5 MG tablet      Past Psychiatric History:  Generalized Anxiety Disorder Bipolar Disorder PTSD  Past Medical History:  Past Medical History:  Diagnosis Date   Alcohol abuse    Alcoholism ([place])    Anemia    Anxiety    Blood transfusion without reported diagnosis    Cirrhosis ([place])    Depression    Esophageal varices with bleeding(456.0) 06/13/2014  GERD (gastroesophageal reflux disease)    Heart murmur    Patient states she may have   Menorrhagia    Pancytopenia ([place]) 01/15/2014   Pneumonia    Portal hypertension ([place])    S/P alcohol detoxification    2-3 days at behavioral health previously   UGI bleed 06/12/2014    Past Surgical History:  Procedure Laterality Date   CHOLECYSTECTOMY      ESOPHAGOGASTRODUODENOSCOPY N/A 06/12/2014   Procedure: ESOPHAGOGASTRODUODENOSCOPY (EGD);  Surgeon: Gatha Mayer, MD;  Location: [facility];  Service: Endoscopy;  Laterality: N/A;   ESOPHAGOGASTRODUODENOSCOPY (EGD) WITH PROPOFOL N/A 07/29/2014   Procedure: ESOPHAGOGASTRODUODENOSCOPY (EGD) WITH PROPOFOL;  Surgeon: Inda Castle, MD;  Location: [facility];  Service: Endoscopy;  Laterality: N/A;   ESOPHAGOGASTRODUODENOSCOPY (EGD) WITH PROPOFOL N/A 01/20/2018   Procedure: ESOPHAGOGASTRODUODENOSCOPY (EGD) WITH PROPOFOL;  Surgeon: Mauri Pole, MD;  Location: [facility];  Service: Endoscopy;  Laterality: N/A;    Family Psychiatric History:  None  Family History:  Family History  Problem Relation Age of Onset   Colon polyps Mother    Hypertension Mother    Thyroid disease Mother    Alcoholism Mother    Alcoholism Father    Alcohol abuse Maternal Grandfather    Alcohol abuse Paternal Grandfather    Alcohol abuse Paternal Aunt    Alcohol abuse Maternal Uncle     Social History:  Social History   Socioeconomic History   Marital status: Married    Spouse name: Sara [name]   Number of children: 2   Years of education: Associates   Highest education level: Not on file  Occupational History   Occupation: Paralegal  Tobacco Use   Smoking status: Former    Packs/day: 0.25    Pack years: 0.00    Types: Cigarettes    Quit date: 11/22/2018    Years since quitting: 2.4   Smokeless tobacco: Never  Substance and Sexual Activity   Alcohol use: Yes    Alcohol/week: 0.0 standard drinks    Comment: Usually drinks 2-3 bottles of wine daily when drinking.    Drug use: No    Types: Cocaine, Marijuana    Comment: denies   Sexual activity: Never    Birth control/protection: None  Other Topics Concern   Not on file  Social History Narrative   Lives with husband and son. Daughter lives nearby. 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   Social Determinants of Health    Financial Resource Strain: Not on file  Food Insecurity: Not on file  Transportation Needs: Not on file  Physical Activity: Not on file  Stress: Not on file  Social Connections: Not on file    Allergies:  Allergies  Allergen Reactions   Morphine And Related Other (See Comments)    Slowed HR, lowered BP   Nsaids Other (See Comments)    Caused internal bleeding    Metabolic Disorder Labs: Lab Results  Component Value Date   HGBA1C 5.2 01/20/2021   MPG 102.54 01/20/2021   MPG 85.32 04/21/2019   Lab Results  Component Value Date   PROLACTIN 28.7 (H) 05/04/2017   Lab Results  Component Value Date   CHOL 154 01/20/2021   TRIG 120 01/20/2021   HDL 47 01/20/2021   CHOLHDL 3.3 01/20/2021   VLDL 24 01/20/2021   LDLCALC 83 01/20/2021   LDLCALC 76 04/21/2019   Lab Results  Component Value Date   TSH 1.153 01/20/2021   TSH 3.658 06/24/2020  Therapeutic Level Labs: No results found for: LITHIUM No results found for: VALPROATE No components found for:  CBMZ  Current Medications: Current Outpatient Medications  Medication Sig Dispense Refill   clonazePAM (KLONOPIN) 0.5 MG tablet Take 1 tablet (0.5 mg total) by mouth 2 (two) times daily as needed for anxiety. 60 tablet 0   escitalopram (LEXAPRO) 10 MG tablet TAKE 3 TABLETS (30 MG TOTAL) BY MOUTH DAILY. 90 tablet 1   gabapentin (NEURONTIN) 100 MG capsule Take 1 capsule (100 mg total) by mouth 3 (three) times daily. 90 capsule 1   naltrexone (DEPADE) 50 MG tablet TAKE 1 TABLET BY MOUTH DAILY. 30 tablet 0   QUEtiapine (SEROQUEL) 100 MG tablet TAKE 1 TABLET (100 MG TOTAL) BY MOUTH AT BEDTIME. 30 tablet 1   topiramate (TOPAMAX) 50 MG tablet Take 2 tablets (100 mg total) by mouth 2 (two) times daily. Patient to take half tablet (50 mg total) in the morning and full tablet at night (100 mg total) 45 tablet 1   traMADol (ULTRAM) 50 MG tablet Take 1 tablet (50 mg total) by mouth every 12 (twelve) hours as needed for severe pain.  10 tablet 0   No current facility-administered medications for this visit.     Musculoskeletal: Strength & Muscle Tone: Unable to assess due to telemedicine visit [place]: Unable to assess due to telemedicine visit Patient leans: Unable to assess due to telemedicine visit  Psychiatric Specialty Exam: Review of Systems  Psychiatric/Behavioral:  Negative for decreased concentration, dysphoric mood, hallucinations, self-injury, sleep disturbance and suicidal ideas. The patient is nervous/anxious and is hyperactive.    There were no vitals taken for this visit.There is no height or weight on file to calculate BMI.  General Appearance: Well Groomed  Eye Contact:  Good  Speech:  Clear and Coherent and Normal Rate  Volume:  Normal  Mood:  Anxious and Euthymic  Affect:  Appropriate  Thought Process:  Coherent, Goal Directed, and Descriptions of Associations: Intact  Orientation:  Full (Time, Place, and Person)  Thought Content: WDL   Suicidal Thoughts:  No  Homicidal Thoughts:  No  Memory:  Immediate;   Good Recent;   Good Remote;   Good  Judgement:  Good  Insight:  Good  Psychomotor Activity:  Normal  Concentration:  Concentration: Good and Attention Span: Good  Recall:  Good  Fund of Knowledge: Good  Language: Good  Akathisia:  NA  Handed:  Right  AIMS (if indicated): not done  Assets:  Communication Skills Desire for Improvement Housing Social Support  ADL's:  Intact  Cognition: WNL  Sleep:  Good   Screenings: AIMS    Flowsheet Row Admission (Discharged) from 04/19/2019 in [facility] Admission (Discharged) from 05/02/2017 in [facility]  AIMS Total Score 0 0      AUDIT    Flowsheet Row Admission (Discharged) from 04/19/2019 in [facility] Admission (Discharged) from 05/02/2017 in [facility] Admission (Discharged) from 12/22/2014  in [facility] Admission (Discharged) from 02/02/2014 in [facility] Admission (Discharged) from 09/27/2013 in [facility]  Alcohol Use Disorder Identification Test Final Score (AUDIT) 34 34 29 38 34      GAD-7    Flowsheet Row Video Visit from 05/13/2021 in [facility] Video Visit from 04/02/2021 in [facility] Video  Visit from 02/17/2021 in [facility] Video Visit from 01/06/2021 in [facility] Video Visit from 12/04/2020 in [facility]  Total GAD-7 Score 16 15 11 17 15       PHQ2-9    Flowsheet Row Video Visit from 05/13/2021 in [facility] Video Visit from 04/02/2021 in [facility] Video Visit from 02/17/2021 in [facility] ED from 01/20/2021 in [facility] Video Visit from 01/06/2021 in [facility]  PHQ-2 Total Score 2 3 4 1 5   PHQ-9 Total Score 10 15 15 15 15       Flowsheet Row Video Visit from 05/13/2021 in [facility] ED from 04/26/2021 in [facility] ED from 04/22/2021 in [facility]  C-SSRS RISK CATEGORY Low Risk No Risk No Risk        Assessment and Plan:   Vyla Pint. Golomb is a 45 year old female with a past psychiatric history significant for alcohol abuse disorder, bipolar 1 disorder, and generalized anxiety disorder who presents to [facility] via virtual video visit for follow-up and medication management.  Patient endorses recent episodes of feeling manic.  The way the patient has been able to find relief from her manic episodes has been taking an extra dose of  topiramate at night.  Patient reports that her medications have been helpful for the most part.  Recommended taking 50 mg of topiramate in the morning and 100 mg at night for the management of her manic episodes.  Patient was encouraged to pick up her gabapentin prescription.  Patient was agreeable to recommendation.  Patient's medications to be e-prescribed to pharmacy of choice.  1. Bipolar 1 disorder (HCC)  - topiramate (TOPAMAX) 50 MG tablet; Patient to take half tablet (50 mg total) in the morning and full tablet at night (100 mg total)  Dispense: 45 tablet; Refill: 1 - escitalopram (LEXAPRO) 10 MG tablet; TAKE 3 TABLETS (30 MG TOTAL) BY MOUTH DAILY.  Dispense: 90 tablet; Refill: 1 - QUEtiapine (SEROQUEL) 100 MG tablet; TAKE 1 TABLET (100 MG TOTAL) BY MOUTH AT BEDTIME.  Dispense: 30 tablet; Refill: 1  2. Generalized anxiety disorder Patient to pick up her prescription for gabapentin 100 mg 3 times daily for the management of her generalized anxiety disorder  - clonazePAM (KLONOPIN) 0.5 MG tablet; Take 1 tablet (0.5 mg total) by mouth 2 (two) times daily as needed for anxiety.  Dispense: 60 tablet; Refill: 0  Patient to follow up in 2 months Provider spent a total of 27 minutes with the patient/reviewing patient's chart  [name], PA 05/13/2021, 2:59 PM

## 2021-05-20 ENCOUNTER — Other Ambulatory Visit: Payer: Self-pay

## 2021-05-21 ENCOUNTER — Encounter: Payer: Self-pay | Admitting: Hematology and Oncology

## 2021-05-21 ENCOUNTER — Other Ambulatory Visit: Payer: Self-pay

## 2021-05-25 ENCOUNTER — Other Ambulatory Visit: Payer: Self-pay

## 2021-05-25 ENCOUNTER — Encounter: Payer: Self-pay | Admitting: Hematology and Oncology

## 2021-05-31 ENCOUNTER — Emergency Department (HOSPITAL_COMMUNITY): Payer: Self-pay

## 2021-05-31 ENCOUNTER — Emergency Department (HOSPITAL_COMMUNITY)
Admission: EM | Admit: 2021-05-31 | Discharge: 2021-05-31 | Disposition: A | Discharge status: Home / Self Care | Payer: Self-pay | Attending: Emergency Medicine | Admitting: Emergency Medicine

## 2021-05-31 ENCOUNTER — Other Ambulatory Visit: Payer: Self-pay

## 2021-05-31 ENCOUNTER — Encounter (HOSPITAL_COMMUNITY): Payer: Self-pay

## 2021-05-31 DIAGNOSIS — D696 Thrombocytopenia, unspecified: Secondary | ICD-10-CM

## 2021-05-31 DIAGNOSIS — Z20822 Contact with and (suspected) exposure to covid-19: Secondary | ICD-10-CM | POA: Insufficient documentation

## 2021-05-31 DIAGNOSIS — J40 Bronchitis, not specified as acute or chronic: Secondary | ICD-10-CM

## 2021-05-31 DIAGNOSIS — F17210 Nicotine dependence, cigarettes, uncomplicated: Secondary | ICD-10-CM | POA: Insufficient documentation

## 2021-05-31 LAB — BASIC METABOLIC PANEL
Anion gap: 8 (ref 5–15)
BUN: <5 mg/dL — ABNORMAL LOW (ref 6–20)
CO2: 21 mmol/L — ABNORMAL LOW (ref 22–32)
Calcium: 8.4 mg/dL — ABNORMAL LOW (ref 8.9–10.3)
Chloride: 114 mmol/L — ABNORMAL HIGH (ref 98–111)
Creatinine, Ser: 0.46 mg/dL (ref 0.44–1.00)
GFR, Estimated: >60 mL/min (ref >60)
Glucose, Bld: 99 mg/dL (ref 70–99)
Potassium: 3.6 mmol/L (ref 3.5–5.1)
Sodium: 143 mmol/L (ref 135–145)

## 2021-05-31 LAB — CBC WITH DIFFERENTIAL/PLATELET
Abs Immature Granulocytes: 0.01 10*3/uL (ref 0.00–0.07)
Basophils Absolute: 0 10*3/uL (ref 0.0–0.1)
Basophils Relative: 1 %
Eosinophils Absolute: 0 10*3/uL (ref 0.0–0.5)
Eosinophils Relative: 1 %
HCT: 36.6 % (ref 36.0–46.0)
Hemoglobin: 11.8 g/dL — ABNORMAL LOW (ref 12.0–15.0)
Immature Granulocytes: 1 %
Lymphocytes Relative: 55 %
Lymphs Abs: 1.2 10*3/uL (ref 0.7–4.0)
MCH: 29 pg (ref 26.0–34.0)
MCHC: 32.2 g/dL (ref 30.0–36.0)
MCV: 89.9 fL (ref 80.0–100.0)
Monocytes Absolute: 0.1 10*3/uL (ref 0.1–1.0)
Monocytes Relative: 5 %
Neutro Abs: 0.8 10*3/uL — ABNORMAL LOW (ref 1.7–7.7)
Neutrophils Relative %: 37 %
Platelets: 78 10*3/uL — ABNORMAL LOW (ref 150–400)
RBC: 4.07 MIL/uL (ref 3.87–5.11)
RDW: 18.5 % — ABNORMAL HIGH (ref 11.5–15.5)
WBC: 2.1 10*3/uL — ABNORMAL LOW (ref 4.0–10.5)
nRBC: 0 % (ref 0.0–0.2)

## 2021-05-31 LAB — RESP PANEL BY RT-PCR (FLU A&B, COVID) ARPGX2
Influenza A by PCR: NEGATIVE
Influenza B by PCR: NEGATIVE
SARS Coronavirus 2 by RT PCR: NEGATIVE

## 2021-05-31 LAB — TROPONIN I (HIGH SENSITIVITY): Troponin I (High Sensitivity): <2 ng/L (ref <18)

## 2021-05-31 MED ORDER — ALBUTEROL SULFATE HFA 108 (90 BASE) MCG/ACT IN AERS
2.0000 | INHALATION_SPRAY | RESPIRATORY_TRACT | Status: DC
  Administered 2021-05-31: 2 via RESPIRATORY_TRACT
  Filled 2021-05-31: qty 6.7
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
## 2021-05-31 NOTE — ED Provider Notes (Signed)
Emergency Medicine Provider Triage Evaluation Note  Sara [name] , a 45 y.o. female  was evaluated in triage.  Pt complains of presents with URI-like symptoms, states that started proxy 1 week ago, she has fevers, chills, sore throat, and generalized weakness.  Patient states she has been feeling worse over the last couple days, states she has chest pain, with shortness of breath.  She is up-to-date on her COVID and her influenza vaccines..  Review of Systems  Positive: Weakness, chest pain Negative: There is congestion, productive cough  Physical Exam  BP 103/75 (BP Location: Left Arm)   Pulse 78   Temp 98.7 F (37.1 C) (Oral)   Resp 16   Ht 5\' 3"  (1.6 m)   Wt 72 kg   SpO2 92%   BMI 28.12 kg/m  Gen:   Awake, no distress   Resp:  Normal effort  MSK:   Moves extremities without difficulty  Other:    Medical Decision Making  Medically screening exam initiated at 4:29 PM.  Appropriate orders placed.  Sara [name] was informed that the remainder of the evaluation will be completed by another provider, this initial triage assessment does not replace that evaluation, and the importance of remaining in the ED until their evaluation is complete.  Patient presents with weakness, lab work imaging been ordered, patient need further work-up in the emergency department.   Marcello Fennel, PA-C 05/31/21 Sara [name], [place], DO 06/01/21 1605

## 2021-05-31 NOTE — ED Triage Notes (Signed)
Patient reports body aches, chills, non-productive cough, weakness, fever, shob with activity x6 days

## 2021-06-01 ENCOUNTER — Other Ambulatory Visit: Payer: Self-pay

## 2021-06-01 LAB — PATHOLOGIST SMEAR REVIEW

## 2021-06-08 ENCOUNTER — Encounter: Payer: Self-pay | Admitting: Hematology and Oncology

## 2021-06-08 ENCOUNTER — Other Ambulatory Visit: Payer: Self-pay

## 2021-06-09 ENCOUNTER — Other Ambulatory Visit: Payer: Self-pay

## 2021-06-09 DIAGNOSIS — D500 Iron deficiency anemia secondary to blood loss (chronic): Secondary | ICD-10-CM

## 2021-06-10 ENCOUNTER — Other Ambulatory Visit: Payer: Self-pay

## 2021-06-10 ENCOUNTER — Inpatient Hospital Stay: Payer: Self-pay | Attending: Hematology and Oncology

## 2021-06-10 DIAGNOSIS — R5383 Other fatigue: Secondary | ICD-10-CM | POA: Insufficient documentation

## 2021-06-10 DIAGNOSIS — Z79899 Other long term (current) drug therapy: Secondary | ICD-10-CM | POA: Insufficient documentation

## 2021-06-10 DIAGNOSIS — F1020 Alcohol dependence, uncomplicated: Secondary | ICD-10-CM | POA: Insufficient documentation

## 2021-06-10 DIAGNOSIS — Z886 Allergy status to analgesic agent status: Secondary | ICD-10-CM | POA: Insufficient documentation

## 2021-06-10 DIAGNOSIS — D500 Iron deficiency anemia secondary to blood loss (chronic): Secondary | ICD-10-CM

## 2021-06-10 DIAGNOSIS — D509 Iron deficiency anemia, unspecified: Secondary | ICD-10-CM | POA: Insufficient documentation

## 2021-06-10 DIAGNOSIS — D61818 Other pancytopenia: Secondary | ICD-10-CM | POA: Insufficient documentation

## 2021-06-10 DIAGNOSIS — Z885 Allergy status to narcotic agent status: Secondary | ICD-10-CM | POA: Insufficient documentation

## 2021-06-10 DIAGNOSIS — K7030 Alcoholic cirrhosis of liver without ascites: Secondary | ICD-10-CM | POA: Insufficient documentation

## 2021-06-10 LAB — CBC WITH DIFFERENTIAL (CANCER CENTER ONLY)
Abs Immature Granulocytes: 0 10*3/uL (ref 0.00–0.07)
Basophils Absolute: 0 10*3/uL (ref 0.0–0.1)
Basophils Relative: 1 %
Eosinophils Absolute: 0 10*3/uL (ref 0.0–0.5)
Eosinophils Relative: 1 %
HCT: 35.6 % — ABNORMAL LOW (ref 36.0–46.0)
Hemoglobin: 11.9 g/dL — ABNORMAL LOW (ref 12.0–15.0)
Immature Granulocytes: 0 %
Lymphocytes Relative: 42 %
Lymphs Abs: 0.9 10*3/uL (ref 0.7–4.0)
MCH: 30 pg (ref 26.0–34.0)
MCHC: 33.4 g/dL (ref 30.0–36.0)
MCV: 89.7 fL (ref 80.0–100.0)
Monocytes Absolute: 0.3 10*3/uL (ref 0.1–1.0)
Monocytes Relative: 13 %
Neutro Abs: 1 10*3/uL — ABNORMAL LOW (ref 1.7–7.7)
Neutrophils Relative %: 43 %
Platelet Count: 50 10*3/uL — ABNORMAL LOW (ref 150–400)
RBC: 3.97 MIL/uL (ref 3.87–5.11)
RDW: 18.1 % — ABNORMAL HIGH (ref 11.5–15.5)
WBC Count: 2.2 10*3/uL — ABNORMAL LOW (ref 4.0–10.5)
nRBC: 0 % (ref 0.0–0.2)

## 2021-06-10 NOTE — Progress Notes (Signed)
Patient Care Team: Antony Blackbird, MD (Inactive) as PCP - General (Family Medicine) Leandrew Koyanagi, MD (Family Medicine)  DIAGNOSIS:    ICD-10-CM   1. 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 with above-mentioned history of alcoholic cirrhosis with pancytopenia and iron deficiency anemia treated with IV iron. Labs on 06/10/21 showed WBC 2.2, Hg 11.9, HCT 35.6, MCV 89.7, PLT 50. She presents to the clinic today for follow-up and to review her labs.  She complains of feeling very fatigued.  Denies any bleeding symptoms.  She has been to the emergency room multiple times.  ALLERGIES:  is allergic to morphine and related and nsaids.  MEDICATIONS:  Current Outpatient Medications  Medication Sig Dispense Refill   clonazePAM (KLONOPIN) 0.5 MG tablet Take 1 tablet (0.5 mg total) by mouth 2 (two) times daily as needed for anxiety. 60 tablet 0   escitalopram (LEXAPRO) 10 MG tablet TAKE 3 TABLETS (30 MG TOTAL) BY MOUTH DAILY. 90 tablet 1   gabapentin (NEURONTIN) 100 MG capsule Take 1 capsule (100 mg total) by mouth 3 (three) times daily. 90 capsule 1   naltrexone (DEPADE) 50 MG tablet TAKE 1 TABLET BY MOUTH DAILY. 30 tablet 0   QUEtiapine (SEROQUEL) 100 MG tablet TAKE 1 TABLET (100 MG TOTAL) BY MOUTH AT BEDTIME. 30 tablet 1   topiramate (TOPAMAX) 50 MG tablet Take 2 tablets by mouth twice daily 45 tablet 1   traMADol (ULTRAM) 50 MG tablet Take 1 tablet (50 mg total) by mouth every 12 (twelve) hours as needed for severe pain. 10 tablet 0   No current facility-administered medications for this visit.    PHYSICAL EXAMINATION: ECOG PERFORMANCE STATUS: 2 - Symptomatic, <50% confined to bed  Vitals:   06/11/21 1117  BP: 101/69  Pulse: 75  Resp: 20  Temp: 97.8 F (36.6 C)  SpO2: 92%   Filed Weights   06/11/21 1117  Weight: 163 lb 6.4 oz (74.1 kg)     LABORATORY DATA:  I have reviewed the data as listed CMP Latest Ref Rng & Units 05/31/2021 04/22/2021 02/01/2021  Glucose 70 - 99 mg/dL 99 104(H) 87  BUN 6 - 20 mg/dL <5(L) 9 9  Creatinine 0.44 - 1.00 mg/dL 0.46 0.86 0.68  Sodium 135 - 145 mmol/L 143 144 136  Potassium 3.5 - 5.1 mmol/L 3.6 3.6 3.2(L)  Chloride 98 - 111 mmol/L 114(H) 115(H) 108  CO2 22 - 32 mmol/L 21(L) 20(L) 22  Calcium 8.9 - 10.3 mg/dL 8.4(L) 9.5 8.7(L)  Total Protein 6.5 - 8.1 g/dL - 7.4 7.6  Total Bilirubin 0.3 - 1.2 mg/dL - 0.6 1.1  Alkaline Phos 38 - 126 U/L - 95 83  AST 15 - 41 U/L - 31 31  ALT 0 - 44 U/L - 21 18    Lab Results  Component Value Date   WBC 2.2 (L) 06/10/2021   HGB 11.9 (L) 06/10/2021   HCT 35.6 (L) 06/10/2021   MCV 89.7 06/10/2021   PLT 50 (L) 06/10/2021   NEUTROABS 1.0 (L) 06/10/2021    ASSESSMENT & PLAN:  Pancytopenia (HCC) Pancytopenia due to alcohol abuse Leukopenia: Primarily neutropenia   Workup: 1. CBC with differential 08/15/2017: WBC 2.7, ANC 1.5, hemoglobin 10, platelets 69 2. P-23 and folic acid levels: Normal 3. ANA: Negative 4. Flow cytometry: No monoclonal B or T-cell aberrant populations identified 5.  Iron studies iron saturation 6%, TIBC 510, ferritin 95. (Given IV iron October 2018, Dec 2020)   Bone marrow biopsy on 09/26/2017: Slightly hypercellular (50-70%) bone marrow for age with trilineage hematopoiesis, mild plasmacytosis 4% The pancytopenia is directly attributable to her alcoholism and cirrhosis of the liver.   Lab review: 06/10/2021: WBC 2.2, hemoglobin 11.9, platelets 50 Hemoglobin is adequate.  Platelet count has come down significantly.   I believe this is a normal fluctuation her platelet count.  She does not complain of any nosebleeds or gum bleeds.  Therefore this can be watched and monitored.  Return to clinic in 1 year for follow-up with labs done ahead of time.    No orders of the defined types were placed in this encounter.  The patient has  a good understanding of the overall plan. she agrees with it. she will call with any problems that may develop before the next visit here.  Total time spent: 20 mins including face to face time and time spent for planning, charting and coordination of care  Rulon Eisenmenger, MD, MPH 06/11/2021  I, Thana Ates, am acting as scribe for Dr. Nicholas Lose.  I have reviewed the above documentation for accuracy and completeness, and I agree with the above.

## 2021-06-11 ENCOUNTER — Inpatient Hospital Stay (HOSPITAL_BASED_OUTPATIENT_CLINIC_OR_DEPARTMENT_OTHER): Payer: Self-pay | Admitting: Hematology and Oncology

## 2021-06-11 DIAGNOSIS — D61818 Other pancytopenia: Secondary | ICD-10-CM

## 2021-06-11 LAB — IRON AND TIBC
Iron: 86 ug/dL (ref 28–170)
Saturation Ratios: 17 % (ref 10.4–31.8)
TIBC: 510 ug/dL — ABNORMAL HIGH (ref 250–450)
UIBC: 424 ug/dL

## 2021-06-11 LAB — FERRITIN: Ferritin: 10 ng/mL — ABNORMAL LOW (ref 11–307)

## 2021-06-11 NOTE — Assessment & Plan Note (Signed)
Pancytopeniadue to alcohol abuse Leukopenia: Primarily neutropenia  Workup: 1. CBC with differential09/25/2018: WBC 2.7, ANC 1.5, hemoglobin 10, platelets 69 2. O-45 and folic acid levels: Normal 3. ANA: Negative 4. Flow cytometry: No monoclonalB orT-cell aberrant populations identified 5.Iron studies iron saturation 6%, TIBC 510, ferritin 95. (Given IV iron October 2018, Dec 2020)  Recommendation: Bone marrow biopsy on 09/26/2017: Slightly hypercellular(50-70%)bone marrow for age with trilineage hematopoiesis, mild plasmacytosis 4% The pancytopenia is directly attributable to her alcoholism and cirrhosis of the liver.  Lab review: WBC 2.2, hemoglobin 11.9, platelets 50 Hemoglobin is adequate.  Platelet count has come down significantly.  Unfortunately there is not much we can do about it.  She has cut down her alcohol intake but still drinks on a regular basis.  Return to clinic in 1 year for follow-up with labs done ahead of time.

## 2021-06-22 ENCOUNTER — Other Ambulatory Visit: Payer: Self-pay

## 2021-06-22 ENCOUNTER — Encounter: Payer: Self-pay | Admitting: Hematology and Oncology

## 2021-06-23 ENCOUNTER — Other Ambulatory Visit: Payer: Self-pay

## 2021-06-23 ENCOUNTER — Encounter: Payer: Self-pay | Admitting: Hematology and Oncology

## 2021-07-01 ENCOUNTER — Telehealth (HOSPITAL_COMMUNITY): Payer: Self-pay | Admitting: Physician Assistant

## 2021-07-01 NOTE — Telephone Encounter (Signed)
Patient called for REFILL: CLONAZEPAM 0.5  Pharmacy : [facility] CV:[id number] [place], [place]   Patient phone number:[phone] Last seen:  05/13/21 COMMENTS:   Next Appt: 07/08/21

## 2021-07-02 ENCOUNTER — Other Ambulatory Visit (HOSPITAL_COMMUNITY): Payer: Self-pay | Admitting: Physician Assistant

## 2021-07-02 DIAGNOSIS — F411 Generalized anxiety disorder: Secondary | ICD-10-CM

## 2021-07-02 MED ORDER — CLONAZEPAM 0.5 MG PO TABS: 0.5000 mg | ORAL_TABLET | Freq: Two times a day (BID) | ORAL | 0 refills | Status: DC | PRN

## 2021-07-02 NOTE — Telephone Encounter (Signed)
Provider was contacted by Arloa Koh for medication refill. Patient's medication to be e-prescribed to pharmacy of choice.

## 2021-07-02 NOTE — Progress Notes (Signed)
Provider was contacted by Arloa Koh for medication refill. Patient's medication to be e-prescribed to pharmacy of choice.

## 2021-07-08 ENCOUNTER — Other Ambulatory Visit: Payer: Self-pay

## 2021-07-08 ENCOUNTER — Telehealth (INDEPENDENT_AMBULATORY_CARE_PROVIDER_SITE_OTHER): Payer: No Payment, Other | Admitting: Physician Assistant

## 2021-07-08 DIAGNOSIS — F411 Generalized anxiety disorder: Secondary | ICD-10-CM | POA: Diagnosis not present

## 2021-07-08 DIAGNOSIS — F319 Bipolar disorder, unspecified: Secondary | ICD-10-CM | POA: Diagnosis not present

## 2021-07-08 DIAGNOSIS — F1010 Alcohol abuse, uncomplicated: Secondary | ICD-10-CM | POA: Diagnosis not present

## 2021-07-09 ENCOUNTER — Encounter: Payer: Self-pay | Admitting: Hematology and Oncology

## 2021-07-09 ENCOUNTER — Other Ambulatory Visit: Payer: Self-pay

## 2021-07-09 MED ORDER — TOPIRAMATE 50 MG PO TABS
50.0000 mg | ORAL_TABLET | Freq: Every day | ORAL | 1 refills | Status: DC
  Filled 2021-07-09: qty 30, 30d supply, fill #0
  Filled 2021-08-23: qty 30, 30d supply, fill #1

## 2021-07-09 MED ORDER — TOPIRAMATE 100 MG PO TABS
100.0000 mg | ORAL_TABLET | Freq: Every day | ORAL | 1 refills | Status: DC
  Filled 2021-07-09: qty 30, 30d supply, fill #0
  Filled 2021-08-23: qty 30, 30d supply, fill #1
  Filled 2021-09-24: qty 30, 30d supply, fill #2

## 2021-07-09 MED ORDER — ESCITALOPRAM OXALATE 10 MG PO TABS
ORAL_TABLET | ORAL | 1 refills | Status: DC
  Filled 2021-07-09: qty 90, fill #0
  Filled 2021-08-23: qty 90, 30d supply, fill #0
  Filled 2021-09-24 – 2021-10-01 (×2): qty 90, 30d supply, fill #1

## 2021-07-09 MED ORDER — QUETIAPINE FUMARATE 100 MG PO TABS
ORAL_TABLET | Freq: Every day | ORAL | 1 refills | Status: DC
Start: 2021-07-09 — End: 2021-09-21
  Filled 2021-07-09: qty 30, fill #0
  Filled 2021-08-02: qty 30, 30d supply, fill #0
  Filled 2021-09-10: qty 30, 30d supply, fill #1

## 2021-08-02 ENCOUNTER — Encounter: Payer: Self-pay | Admitting: Hematology and Oncology

## 2021-08-02 ENCOUNTER — Other Ambulatory Visit: Payer: Self-pay

## 2021-08-11 ENCOUNTER — Telehealth (HOSPITAL_COMMUNITY): Payer: Self-pay | Admitting: *Deleted

## 2021-08-11 NOTE — Telephone Encounter (Signed)
Fax from pharmacy requesting a new rx for her Klonopin. She should be out of it. SHe has a return appt with provider Eddie PA on 09/09/21. Will bring rx request to the provider.

## 2021-08-13 ENCOUNTER — Other Ambulatory Visit (HOSPITAL_COMMUNITY): Payer: Self-pay | Admitting: Physician Assistant

## 2021-08-13 DIAGNOSIS — F411 Generalized anxiety disorder: Secondary | ICD-10-CM

## 2021-08-13 MED ORDER — CLONAZEPAM 0.5 MG PO TABS: 0.5000 mg | ORAL_TABLET | Freq: Two times a day (BID) | ORAL | 0 refills | Status: DC | PRN

## 2021-08-13 NOTE — Progress Notes (Signed)
Provider was contacted by Glory Buff. Olevia Bowens, RN regarding medication refill for this patient. Patient's medication to be e-prescribed to preferred pharmacy.

## 2021-08-13 NOTE — Telephone Encounter (Signed)
Provider was contacted by Glory Buff. Olevia Bowens, RN regarding medication refill for this patient. Patient's medication to be e-prescribed to preferred pharmacy.

## 2021-08-20 ENCOUNTER — Encounter (HOSPITAL_COMMUNITY): Payer: Self-pay | Admitting: Physician Assistant

## 2021-08-20 NOTE — Progress Notes (Signed)
BH MD/PA/NP OP Progress Note  Virtual Visit via Video Note  I connected with Sara [name] on 08/20/21 at  4:30 PM EDT by a video enabled telemedicine application and verified that I am speaking with the correct person using two identifiers.  Location: Patient: Home Provider: Clinic   I discussed the limitations of evaluation and management by telemedicine and the availability of in person appointments. The patient expressed understanding and agreed to proceed.  Follow Up Instructions:   I discussed the assessment and treatment plan with the patient. The patient was provided an opportunity to ask questions and all were answered. The patient agreed with the plan and demonstrated an understanding of the instructions.   The patient was advised to call back or seek an in-person evaluation if the symptoms worsen or if the condition fails to improve as anticipated.  I provided 24 minutes of non-face-to-face time during this encounter.  [name], PA   08/20/2021 11:35 PM Sara [name]  MRN:  [id number]  Chief Complaint: Follow up and medication management  HPI:     Visit Diagnosis:    ICD-10-CM   1. Generalized anxiety disorder  F41.1 escitalopram (LEXAPRO) 10 MG tablet    2. Bipolar 1 disorder (HCC)  F31.9 escitalopram (LEXAPRO) 10 MG tablet    topiramate (TOPAMAX) 50 MG tablet    topiramate (TOPAMAX) 100 MG tablet    QUEtiapine (SEROQUEL) 100 MG tablet    3. Alcohol abuse  F10.10       Past Psychiatric History:  Generalized Anxiety Disorder Bipolar Disorder PTSD  Past Medical History:  Past Medical History:  Diagnosis Date   Alcohol abuse    Alcoholism ([place])    Anemia    Anxiety    Blood transfusion without reported diagnosis    Cirrhosis ([place])    Depression    Esophageal varices with bleeding(456.0) 06/13/2014   GERD (gastroesophageal reflux disease)    Heart murmur    Patient states she may have   Menorrhagia    Pancytopenia ([place])  01/15/2014   Pneumonia    Portal hypertension ([place])    S/P alcohol detoxification    2-3 days at behavioral health previously   UGI bleed 06/12/2014    Past Surgical History:  Procedure Laterality Date   CHOLECYSTECTOMY     ESOPHAGOGASTRODUODENOSCOPY N/A 06/12/2014   Procedure: ESOPHAGOGASTRODUODENOSCOPY (EGD);  Surgeon: Gatha Mayer, MD;  Location: [facility];  Service: Endoscopy;  Laterality: N/A;   ESOPHAGOGASTRODUODENOSCOPY (EGD) WITH PROPOFOL N/A 07/29/2014   Procedure: ESOPHAGOGASTRODUODENOSCOPY (EGD) WITH PROPOFOL;  Surgeon: Inda Castle, MD;  Location: [facility];  Service: Endoscopy;  Laterality: N/A;   ESOPHAGOGASTRODUODENOSCOPY (EGD) WITH PROPOFOL N/A 01/20/2018   Procedure: ESOPHAGOGASTRODUODENOSCOPY (EGD) WITH PROPOFOL;  Surgeon: Mauri Pole, MD;  Location: [facility];  Service: Endoscopy;  Laterality: N/A;    Family Psychiatric History:  None  Family History:  Family History  Problem Relation Age of Onset   Colon polyps Mother    Hypertension Mother    Thyroid disease Mother    Alcoholism Mother    Alcoholism Father    Alcohol abuse Maternal Grandfather    Alcohol abuse Paternal Grandfather    Alcohol abuse Paternal Aunt    Alcohol abuse Maternal Uncle     Social History:  Social History   Socioeconomic History   Marital status: Married    Spouse name: [name]   Number of children: 2   Years of education: Associates   Highest education level: Not  on file  Occupational History   Occupation: Paralegal  Tobacco Use   Smoking status: Former    Packs/day: 0.25    Types: Cigarettes    Quit date: 11/22/2018    Years since quitting: 2.7   Smokeless tobacco: Never  Substance and Sexual Activity   Alcohol use: Yes    Alcohol/week: 0.0 standard drinks    Comment: Usually drinks 2-3 bottles of wine daily when drinking.    Drug use: No    Types: Cocaine, Marijuana    Comment: denies   Sexual activity: Never    Birth control/protection: None   Other Topics Concern   Not on file  Social History Narrative   Lives with husband and son. Daughter lives nearby. 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   Social Determinants of Health   Financial Resource Strain: Not on file  Food Insecurity: Not on file  Transportation Needs: Not on file  Physical Activity: Not on file  Stress: Not on file  Social Connections: Not on file    Allergies:  Allergies  Allergen Reactions   Morphine And Related Other (See Comments)    Slowed HR, lowered BP   Nsaids Other (See Comments)    Caused internal bleeding    Metabolic Disorder Labs: Lab Results  Component Value Date   HGBA1C 5.2 01/20/2021   MPG 102.54 01/20/2021   MPG 85.32 04/21/2019   Lab Results  Component Value Date   PROLACTIN 28.7 (H) 05/04/2017   Lab Results  Component Value Date   CHOL 154 01/20/2021   TRIG 120 01/20/2021   HDL 47 01/20/2021   CHOLHDL 3.3 01/20/2021   VLDL 24 01/20/2021   LDLCALC 83 01/20/2021   LDLCALC 76 04/21/2019   Lab Results  Component Value Date   TSH 1.153 01/20/2021   TSH 3.658 06/24/2020    Therapeutic Level Labs: No results found for: LITHIUM No results found for: VALPROATE No components found for:  CBMZ  Current Medications: Current Outpatient Medications  Medication Sig Dispense Refill   topiramate (TOPAMAX) 100 MG tablet Take 1 tablet (100 mg total) by mouth at bedtime. 60 tablet 1   clonazePAM (KLONOPIN) 0.5 MG tablet Take 1 tablet (0.5 mg total) by mouth 2 (two) times daily as needed for anxiety. 60 tablet 0   escitalopram (LEXAPRO) 10 MG tablet TAKE 3 TABLETS (30 MG TOTAL) BY MOUTH DAILY. 90 tablet 1   QUEtiapine (SEROQUEL) 100 MG tablet TAKE 1 TABLET (100 MG TOTAL) BY MOUTH AT BEDTIME. 30 tablet 1   topiramate (TOPAMAX) 50 MG tablet Take 1 tablet (50 mg total) by mouth daily. 30 tablet 1   No current facility-administered medications for this visit.     Musculoskeletal: Strength & Muscle  Tone: within normal limits Gait & Station: normal Patient leans: N/A  Psychiatric Specialty Exam: Review of Systems  There were no vitals taken for this visit.There is no height or weight on file to calculate BMI.  General Appearance: Well Groomed  Eye Contact:  Good  Speech:  Clear and Coherent and Normal Rate  Volume:  Normal  Mood:  Anxious and Euthymic  Affect:  Appropriate and Congruent  Thought Process:  Coherent, Goal Directed, and Descriptions of Associations: Intact  Orientation:  Full (Time, Place, and Person)  Thought Content: WDL   Suicidal Thoughts:  No  Homicidal Thoughts:  No  Memory:  Immediate;   Good Recent;   Good Remote;   Good  Judgement:  Good  Insight:  Good  Psychomotor Activity:  Normal  Concentration:  Concentration: Good and Attention Span: Good  Recall:  Good  Fund of Knowledge: Good  Language: Good  Akathisia:  NA  Handed:  Right  AIMS (if indicated): not done  Assets:  Communication Skills Desire for Improvement Housing Social Support  ADL's:  Intact  Cognition: WNL  Sleep:  Good   Screenings: AIMS    Flowsheet Row Admission (Discharged) from 04/19/2019 in [facility] Admission (Discharged) from 05/02/2017 in [facility]  AIMS Total Score 0 0      AUDIT    Flowsheet Row Admission (Discharged) from 04/19/2019 in [facility] Admission (Discharged) from 05/02/2017 in [facility] Admission (Discharged) from 12/22/2014 in [place] [facility] Admission (Discharged) from 02/02/2014 in [facility] Admission (Discharged) from 09/27/2013 in [facility]  Alcohol Use Disorder Identification Test Final Score (AUDIT) 34 34 29 38 34      GAD-7    Flowsheet Row Video Visit from 07/08/2021 in [facility] Video Visit from 05/13/2021 in [facility] Video Visit from 04/02/2021 in [facility] Video Visit from 02/17/2021 in [facility] Video Visit from 01/06/2021 in [facility]  Total GAD-7 Score 18 16 15 11 17       PHQ2-9    Flowsheet Row Video Visit from 07/08/2021 in [facility] Video Visit from 05/13/2021 in [facility] Video Visit from 04/02/2021 in [facility] Video Visit from 02/17/2021 in [facility] ED from 01/20/2021 in [facility]  PHQ-2 Total Score 2 2 3 4 1   PHQ-9 Total Score 9 10 15 15 15       Flowsheet Row Video Visit from 07/08/2021 in [facility] ED from 05/31/2021 in [place] [facility] Video Visit from 05/13/2021 in [place] No Risk No Risk Low Risk        Assessment and Plan:     1. Bipolar 1 disorder (HCC)  - escitalopram (LEXAPRO) 10 MG tablet; TAKE 3 TABLETS (30 MG TOTAL) BY MOUTH DAILY.  Dispense: 90 tablet; Refill: 1 - topiramate (TOPAMAX) 50 MG tablet; Take 1 tablet (50 mg total) by mouth daily.  Dispense: 30 tablet; Refill: 1 - topiramate (TOPAMAX) 100 MG tablet; Take 1 tablet (100 mg total) by mouth at bedtime.  Dispense: 60 tablet; Refill: 1 - QUEtiapine (SEROQUEL) 100 MG tablet; TAKE 1 TABLET (100 MG TOTAL) BY MOUTH AT BEDTIME.  Dispense: 30 tablet; Refill: 1  2. Generalized anxiety disorder  - escitalopram (LEXAPRO) 10 MG tablet; TAKE 3 TABLETS (30 MG TOTAL) BY MOUTH DAILY.  Dispense: 90 tablet; Refill: 1  3. Alcohol abuse  Patient to follow up in 2 months Provider spent a total of 24 minutes with the patient/reviewing patient's chart  [name], PA 08/20/2021, 11:35 PM

## 2021-08-23 ENCOUNTER — Encounter: Payer: Self-pay | Admitting: Hematology and Oncology

## 2021-08-23 ENCOUNTER — Other Ambulatory Visit: Payer: Self-pay

## 2021-09-09 ENCOUNTER — Telehealth (HOSPITAL_COMMUNITY): Payer: No Payment, Other | Admitting: Physician Assistant

## 2021-09-09 ENCOUNTER — Other Ambulatory Visit: Payer: Self-pay

## 2021-09-10 ENCOUNTER — Encounter: Payer: Self-pay | Admitting: Hematology and Oncology

## 2021-09-10 ENCOUNTER — Other Ambulatory Visit: Payer: Self-pay

## 2021-09-15 ENCOUNTER — Other Ambulatory Visit: Payer: Self-pay

## 2021-09-21 ENCOUNTER — Other Ambulatory Visit (HOSPITAL_COMMUNITY): Payer: Self-pay | Admitting: Physician Assistant

## 2021-09-21 ENCOUNTER — Telehealth (HOSPITAL_COMMUNITY): Payer: Self-pay | Admitting: *Deleted

## 2021-09-21 DIAGNOSIS — F319 Bipolar disorder, unspecified: Secondary | ICD-10-CM

## 2021-09-21 DIAGNOSIS — F411 Generalized anxiety disorder: Secondary | ICD-10-CM

## 2021-09-21 MED ORDER — CLONAZEPAM 0.5 MG PO TABS: 0.5000 mg | ORAL_TABLET | Freq: Two times a day (BID) | ORAL | 0 refills | Status: DC | PRN

## 2021-09-21 MED ORDER — QUETIAPINE FUMARATE 100 MG PO TABS
ORAL_TABLET | Freq: Every day | ORAL | 1 refills | Status: DC
  Filled 2021-09-21: qty 30, 30d supply, fill #0

## 2021-09-21 NOTE — Progress Notes (Signed)
Provider was contacted by Glory Buff. Beck RN regarding patient's request for medication refill. Provider will bridge patient's medications until next appointment scheduled for 11/09/2021.

## 2021-09-21 NOTE — Telephone Encounter (Signed)
Call from patient needing rxs. She has an appt with Eddie PA on 10/22/21 but this is a new appt she recently made after missing her appt in Oct. She should be out of her Klonopin and Seroquel but has refills on her others Will let Eddie PA know and decide if he wants to call new rx in for her till her Dec appt.

## 2021-09-21 NOTE — Telephone Encounter (Signed)
Provider was contacted by Glory Buff. Beck RN regarding patient's request for medication refill. Provider will bridge patient's medications until next appointment scheduled for 11/09/2021.

## 2021-09-22 ENCOUNTER — Encounter: Payer: Self-pay | Admitting: Hematology and Oncology

## 2021-09-22 ENCOUNTER — Other Ambulatory Visit: Payer: Self-pay

## 2021-09-24 ENCOUNTER — Other Ambulatory Visit: Payer: Self-pay

## 2021-09-24 ENCOUNTER — Other Ambulatory Visit (HOSPITAL_COMMUNITY): Payer: Self-pay | Admitting: Physician Assistant

## 2021-09-24 ENCOUNTER — Encounter: Payer: Self-pay | Admitting: Hematology and Oncology

## 2021-09-24 DIAGNOSIS — F319 Bipolar disorder, unspecified: Secondary | ICD-10-CM

## 2021-09-27 ENCOUNTER — Other Ambulatory Visit (HOSPITAL_COMMUNITY): Payer: Self-pay | Admitting: Physician Assistant

## 2021-09-27 DIAGNOSIS — F319 Bipolar disorder, unspecified: Secondary | ICD-10-CM

## 2021-09-27 MED ORDER — TOPIRAMATE 50 MG PO TABS
50.0000 mg | ORAL_TABLET | Freq: Every day | ORAL | 1 refills | Status: DC
  Filled 2021-09-27 – 2021-10-01 (×2): qty 30, 30d supply, fill #0

## 2021-09-27 MED ORDER — TOPIRAMATE 100 MG PO TABS: 100.0000 mg | ORAL_TABLET | Freq: Every day | ORAL | 1 refills | Status: DC

## 2021-09-27 NOTE — Progress Notes (Signed)
Patient is currently taking Topamax 150 mg at bedtime for the management of her Bipolar I disorder.

## 2021-09-28 ENCOUNTER — Other Ambulatory Visit: Payer: Self-pay

## 2021-09-28 ENCOUNTER — Encounter: Payer: Self-pay | Admitting: Hematology and Oncology

## 2021-10-01 ENCOUNTER — Encounter: Payer: Self-pay | Admitting: Hematology and Oncology

## 2021-10-01 ENCOUNTER — Other Ambulatory Visit: Payer: Self-pay

## 2021-10-04 ENCOUNTER — Other Ambulatory Visit: Payer: Self-pay

## 2021-10-06 ENCOUNTER — Other Ambulatory Visit: Payer: Self-pay

## 2021-10-06 ENCOUNTER — Emergency Department (HOSPITAL_COMMUNITY)
Admission: EM | Admit: 2021-10-06 | Discharge: 2021-10-07 | Disposition: A | Discharge status: Other Acute Inpatient Hospital | Payer: Medicaid Other | Attending: Emergency Medicine | Admitting: Emergency Medicine

## 2021-10-06 ENCOUNTER — Encounter (HOSPITAL_COMMUNITY): Payer: Self-pay | Admitting: Emergency Medicine

## 2021-10-06 DIAGNOSIS — Z20822 Contact with and (suspected) exposure to covid-19: Secondary | ICD-10-CM | POA: Insufficient documentation

## 2021-10-06 DIAGNOSIS — F32A Depression, unspecified: Secondary | ICD-10-CM | POA: Insufficient documentation

## 2021-10-06 DIAGNOSIS — I10 Essential (primary) hypertension: Secondary | ICD-10-CM | POA: Insufficient documentation

## 2021-10-06 DIAGNOSIS — N9489 Other specified conditions associated with female genital organs and menstrual cycle: Secondary | ICD-10-CM | POA: Insufficient documentation

## 2021-10-06 DIAGNOSIS — Z87891 Personal history of nicotine dependence: Secondary | ICD-10-CM | POA: Insufficient documentation

## 2021-10-06 DIAGNOSIS — F10920 Alcohol use, unspecified with intoxication, uncomplicated: Secondary | ICD-10-CM

## 2021-10-06 DIAGNOSIS — F1024 Alcohol dependence with alcohol-induced mood disorder: Secondary | ICD-10-CM | POA: Insufficient documentation

## 2021-10-06 DIAGNOSIS — Y908 Blood alcohol level of 240 mg/100 ml or more: Secondary | ICD-10-CM | POA: Insufficient documentation

## 2021-10-06 LAB — COMPREHENSIVE METABOLIC PANEL
ALT: 24 U/L (ref 0–44)
AST: 49 U/L — ABNORMAL HIGH (ref 15–41)
Albumin: 3.1 g/dL — ABNORMAL LOW (ref 3.5–5.0)
Alkaline Phosphatase: 88 U/L (ref 38–126)
Anion gap: 10 (ref 5–15)
BUN: 9 mg/dL (ref 6–20)
CO2: 20 mmol/L — ABNORMAL LOW (ref 22–32)
Calcium: 8.4 mg/dL — ABNORMAL LOW (ref 8.9–10.3)
Chloride: 110 mmol/L (ref 98–111)
Creatinine, Ser: 0.52 mg/dL (ref 0.44–1.00)
GFR, Estimated: >60 mL/min (ref >60)
Glucose, Bld: 119 mg/dL — ABNORMAL HIGH (ref 70–99)
Potassium: 3.3 mmol/L — ABNORMAL LOW (ref 3.5–5.1)
Sodium: 140 mmol/L (ref 135–145)
Total Bilirubin: 0.8 mg/dL (ref 0.3–1.2)
Total Protein: 7.2 g/dL (ref 6.5–8.1)

## 2021-10-06 LAB — CBC
HCT: 35.1 % — ABNORMAL LOW (ref 36.0–46.0)
Hemoglobin: 11.4 g/dL — ABNORMAL LOW (ref 12.0–15.0)
MCH: 29 pg (ref 26.0–34.0)
MCHC: 32.5 g/dL (ref 30.0–36.0)
MCV: 89.3 fL (ref 80.0–100.0)
Platelets: 76 10*3/uL — ABNORMAL LOW (ref 150–400)
RBC: 3.93 MIL/uL (ref 3.87–5.11)
RDW: 17.4 % — ABNORMAL HIGH (ref 11.5–15.5)
WBC: 2.2 10*3/uL — ABNORMAL LOW (ref 4.0–10.5)
nRBC: 0 % (ref 0.0–0.2)

## 2021-10-06 LAB — ETHANOL: Alcohol, Ethyl (B): 285 mg/dL — ABNORMAL HIGH (ref <10)

## 2021-10-06 LAB — I-STAT BETA HCG BLOOD, ED (MC, WL, AP ONLY): I-stat hCG, quantitative: <5 m[IU]/mL (ref <5)

## 2021-10-06 LAB — LIPASE, BLOOD: Lipase: 93 U/L — ABNORMAL HIGH (ref 11–51)

## 2021-10-06 MED ORDER — ONDANSETRON 4 MG PO TBDP
4.0000 mg | ORAL_TABLET | Freq: Once | ORAL | Status: AC | PRN
  Administered 2021-10-07: 12:00:00 4 mg via ORAL
  Filled 2021-10-06: qty 1

## 2021-10-06 NOTE — ED Triage Notes (Signed)
Patient states she drunk hand sanitizer because she did not have anything else to drink. Patient states she has been vomiting.

## 2021-10-06 NOTE — ED Provider Notes (Signed)
Emergency Medicine Provider Triage Evaluation Note  Sara [name] , a 45 y.o. female  was evaluated in triage.  Pt complains of hematemesis. Reports etoh use tonight.  Review of Systems  Positive: Hematemesis, abd pain, nv Negative: diarrhea  Physical Exam  BP 106/72 (BP Location: Right Arm)   Pulse 88   Temp 98.6 F (37 C) (Oral)   Resp 14   SpO2 93%  Gen:   Awake, no distress   Resp:  Normal effort  MSK:   Moves extremities without difficulty  Other:  Appears intoxicated  Medical Decision Making  Medically screening exam initiated at 9:11 PM.  Appropriate orders placed.  Sara [name] was informed that the remainder of the evaluation will be completed by another provider, this initial triage assessment does not replace that evaluation, and the importance of remaining in the ED until their evaluation is complete.     Bishop Dublin 10/06/21 2112    Milton Ferguson, MD 10/08/21 1012

## 2021-10-07 ENCOUNTER — Other Ambulatory Visit (HOSPITAL_COMMUNITY)
Admission: EM | Admit: 2021-10-07 | Discharge: 2021-10-11 | Disposition: A | Discharge status: Home / Self Care | Payer: No Payment, Other | Attending: Family | Admitting: Family

## 2021-10-07 ENCOUNTER — Encounter (HOSPITAL_COMMUNITY): Payer: Self-pay | Admitting: Emergency Medicine

## 2021-10-07 DIAGNOSIS — Z87891 Personal history of nicotine dependence: Secondary | ICD-10-CM | POA: Insufficient documentation

## 2021-10-07 DIAGNOSIS — F1994 Other psychoactive substance use, unspecified with psychoactive substance-induced mood disorder: Secondary | ICD-10-CM | POA: Diagnosis present

## 2021-10-07 DIAGNOSIS — F411 Generalized anxiety disorder: Secondary | ICD-10-CM

## 2021-10-07 DIAGNOSIS — F4310 Post-traumatic stress disorder, unspecified: Secondary | ICD-10-CM | POA: Diagnosis not present

## 2021-10-07 DIAGNOSIS — F319 Bipolar disorder, unspecified: Secondary | ICD-10-CM

## 2021-10-07 DIAGNOSIS — F1094 Alcohol use, unspecified with alcohol-induced mood disorder: Secondary | ICD-10-CM | POA: Diagnosis present

## 2021-10-07 LAB — POCT URINE DRUG SCREEN - MANUAL ENTRY (I-SCREEN)
POC Amphetamine UR: NOT DETECTED
POC Buprenorphine (BUP): NOT DETECTED
POC Cocaine UR: NOT DETECTED
POC Marijuana UR: NOT DETECTED
POC Methadone UR: NOT DETECTED
POC Methamphetamine UR: NOT DETECTED
POC Morphine: NOT DETECTED
POC Oxazepam (BZO): POSITIVE — AB
POC Oxycodone UR: NOT DETECTED
POC Secobarbital (BAR): NOT DETECTED

## 2021-10-07 LAB — RESP PANEL BY RT-PCR (FLU A&B, COVID) ARPGX2
Influenza A by PCR: NEGATIVE
Influenza B by PCR: NEGATIVE
SARS Coronavirus 2 by RT PCR: NEGATIVE

## 2021-10-07 LAB — URINALYSIS, ROUTINE W REFLEX MICROSCOPIC
Bilirubin Urine: NEGATIVE
Glucose, UA: NEGATIVE mg/dL
Ketones, ur: 5 mg/dL — AB
Nitrite: NEGATIVE
Protein, ur: NEGATIVE mg/dL
Specific Gravity, Urine: 1.011 (ref 1.005–1.030)
pH: 5 (ref 5.0–8.0)

## 2021-10-07 MED ORDER — MAGNESIUM HYDROXIDE 400 MG/5ML PO SUSP: 30.0000 mL | Freq: Every day | ORAL | Status: DC | PRN

## 2021-10-07 MED ORDER — GABAPENTIN 100 MG PO CAPS
100.0000 mg | ORAL_CAPSULE | Freq: Three times a day (TID) | ORAL | Status: DC
  Administered 2021-10-07 – 2021-10-11 (×12): 100 mg via ORAL
  Filled 2021-10-07 (×12): qty 1
  Filled 2021-10-07: qty 42

## 2021-10-07 MED ORDER — HYDROXYZINE HCL 25 MG PO TABS: 25.0000 mg | ORAL_TABLET | Freq: Four times a day (QID) | ORAL | Status: AC | PRN

## 2021-10-07 MED ORDER — TOPIRAMATE 25 MG PO TABS: 50.0000 mg | ORAL_TABLET | Freq: Every day | ORAL | Status: DC

## 2021-10-07 MED ORDER — LORAZEPAM 1 MG PO TABS
1.0000 mg | ORAL_TABLET | Freq: Once | ORAL | Status: AC
  Administered 2021-10-07: 13:00:00 1 mg via ORAL
  Filled 2021-10-07: qty 1

## 2021-10-07 MED ORDER — LORAZEPAM 1 MG PO TABS
1.0000 mg | ORAL_TABLET | Freq: Every day | ORAL | Status: AC
  Administered 2021-10-09: 1 mg via ORAL

## 2021-10-07 MED ORDER — TOPIRAMATE 25 MG PO TABS
50.0000 mg | ORAL_TABLET | Freq: Every day | ORAL | Status: DC
  Administered 2021-10-08 – 2021-10-11 (×4): 50 mg via ORAL
  Filled 2021-10-07 (×4): qty 2

## 2021-10-07 MED ORDER — HYDROXYZINE HCL 25 MG PO TABS: 25.0000 mg | ORAL_TABLET | Freq: Three times a day (TID) | ORAL | Status: DC | PRN

## 2021-10-07 MED ORDER — ONDANSETRON 4 MG PO TBDP: 4.0000 mg | ORAL_TABLET | Freq: Four times a day (QID) | ORAL | Status: AC | PRN

## 2021-10-07 MED ORDER — LORAZEPAM 1 MG PO TABS
1.0000 mg | ORAL_TABLET | Freq: Four times a day (QID) | ORAL | Status: AC
  Administered 2021-10-07 – 2021-10-08 (×6): 1 mg via ORAL
  Filled 2021-10-07 (×6): qty 1

## 2021-10-07 MED ORDER — LORAZEPAM 1 MG PO TABS
1.0000 mg | ORAL_TABLET | Freq: Two times a day (BID) | ORAL | Status: AC
  Administered 2021-10-10 (×2): 1 mg via ORAL
  Filled 2021-10-07 (×2): qty 1

## 2021-10-07 MED ORDER — TOPIRAMATE 100 MG PO TABS
100.0000 mg | ORAL_TABLET | Freq: Every day | ORAL | Status: DC
  Administered 2021-10-07 – 2021-10-10 (×4): 100 mg via ORAL
  Filled 2021-10-07: qty 1
  Filled 2021-10-07: qty 21
  Filled 2021-10-07 (×3): qty 1

## 2021-10-07 MED ORDER — TRAZODONE HCL 50 MG PO TABS: 50.0000 mg | ORAL_TABLET | Freq: Every evening | ORAL | Status: DC | PRN

## 2021-10-07 MED ORDER — QUETIAPINE FUMARATE 100 MG PO TABS
100.0000 mg | ORAL_TABLET | Freq: Every day | ORAL | Status: DC
  Administered 2021-10-07 – 2021-10-10 (×4): 100 mg via ORAL
  Filled 2021-10-07: qty 1
  Filled 2021-10-07: qty 14
  Filled 2021-10-07 (×3): qty 1

## 2021-10-07 MED ORDER — ALUM & MAG HYDROXIDE-SIMETH 200-200-20 MG/5ML PO SUSP: 30.0000 mL | ORAL | Status: DC | PRN

## 2021-10-07 MED ORDER — LOPERAMIDE HCL 2 MG PO CAPS: 2.0000 mg | ORAL_CAPSULE | ORAL | Status: AC | PRN

## 2021-10-07 MED ORDER — ADULT MULTIVITAMIN W/MINERALS CH
1.0000 | ORAL_TABLET | Freq: Every day | ORAL | Status: DC
  Administered 2021-10-07 – 2021-10-11 (×5): 1 via ORAL
  Filled 2021-10-07 (×5): qty 1

## 2021-10-07 MED ORDER — ESCITALOPRAM OXALATE 10 MG PO TABS
30.0000 mg | ORAL_TABLET | Freq: Every day | ORAL | Status: DC
  Administered 2021-10-07 – 2021-10-11 (×5): 30 mg via ORAL
  Filled 2021-10-07 (×4): qty 3
  Filled 2021-10-07: qty 42
  Filled 2021-10-07: qty 3

## 2021-10-07 MED ORDER — ACETAMINOPHEN 325 MG PO TABS: 650.0000 mg | ORAL_TABLET | Freq: Four times a day (QID) | ORAL | Status: DC | PRN

## 2021-10-07 MED ORDER — LORAZEPAM 1 MG PO TABS
1.0000 mg | ORAL_TABLET | Freq: Three times a day (TID) | ORAL | Status: AC
  Administered 2021-10-09 (×2): 1 mg via ORAL
  Filled 2021-10-07 (×3): qty 1

## 2021-10-07 MED ORDER — THIAMINE HCL 100 MG PO TABS
100.0000 mg | ORAL_TABLET | Freq: Every day | ORAL | Status: DC
  Administered 2021-10-08 – 2021-10-11 (×4): 100 mg via ORAL
  Filled 2021-10-07 (×4): qty 1

## 2021-10-07 MED ORDER — LORAZEPAM 1 MG PO TABS: 1.0000 mg | ORAL_TABLET | Freq: Four times a day (QID) | ORAL | Status: AC | PRN

## 2021-10-07 MED ORDER — THIAMINE HCL 100 MG/ML IJ SOLN
100.0000 mg | Freq: Once | INTRAMUSCULAR | Status: AC
  Administered 2021-10-07: 17:00:00 100 mg via INTRAMUSCULAR
  Filled 2021-10-07: qty 2

## 2021-10-07 NOTE — Progress Notes (Signed)
Received [name] on [facility] alert and oriented from [facility]. She was cooperative with the admission process and signing the paperwork. She was hesitant and tearful when asked if she feels suicidal, the response was yes without a plan.She endorsed feeling depressed, anxious and have had visual and auditory hallucinations earlier today. She spoke with the MD. Education officer, museum and  PA.  Afterwards she was oriented to her room and prepared to received her dinner.

## 2021-10-07 NOTE — Progress Notes (Signed)
Pt's CIWA was 3
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
## 2021-10-07 NOTE — ED Provider Notes (Signed)
Pt has been accepted for transfer to [facility] by Dr. Serafina Mitchell.  She remains stable for transfer.   Isla Pence, MD 10/07/21 [phone]
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
## 2021-10-07 NOTE — ED Notes (Signed)
Pt is in wrap up group.

## 2021-10-07 NOTE — ED Notes (Signed)
Report called and given to [place] at [facility].

## 2021-10-07 NOTE — ED Notes (Signed)
Belongings returned and sent with patient.
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
## 2021-10-07 NOTE — ED Notes (Signed)
Safe transport called to transport patient.

## 2021-10-07 NOTE — ED Notes (Signed)
Pt is in dining room eating a snack.

## 2021-10-07 NOTE — ED Notes (Signed)
Pt is currently sleeping, no distress noted, environmental check complete, will continue to monitor patient for safety. ? ?

## 2021-10-07 NOTE — ED Notes (Signed)
TTS started
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
## 2021-10-08 DIAGNOSIS — F1094 Alcohol use, unspecified with alcohol-induced mood disorder: Secondary | ICD-10-CM | POA: Diagnosis not present

## 2021-10-08 DIAGNOSIS — Z87891 Personal history of nicotine dependence: Secondary | ICD-10-CM | POA: Diagnosis not present

## 2021-10-08 DIAGNOSIS — F4310 Post-traumatic stress disorder, unspecified: Secondary | ICD-10-CM | POA: Diagnosis not present

## 2021-10-08 NOTE — ED Notes (Signed)
Pt eating dinner

## 2021-10-08 NOTE — ED Provider Notes (Signed)
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 FDA under an Emergency Use Authorization (EUA). This EUA will remain in effect (meaning this test can be used) for the duration of the COVID-19 declaration under Section 564(b)(1) of the Act, 21 U.S.C. section 360bbb-3(b)(1), unless the authorization is terminated or revoked.  Performed at [facility], [place] [address] [place],  [place] [address]    Troponin I (High Sensitivity) 05/31/2021 <2  <18 ng/L Final   Comment: (NOTE) Elevated high sensitivity troponin I (hsTnI) values and significant  changes across serial measurements may suggest ACS but many other  chronic and acute conditions are known to elevate hsTnI results.  Refer to the  Links section for chest pain algorithms and additional  guidance. Performed at [facility], [place] [address], [place], [place] [address]    Path Review 05/31/2021 Reviewed By Violet Baldy, M.D.   Final   Comment: 06/01/21 Pancytopenia. Performed at [facility], [place] [address], [place], [place] [address]   Admission on 04/26/2021, Discharged on 04/26/2021  Component Date Value Ref Range Status   Preg Test, Ur 04/26/2021 NEGATIVE  NEGATIVE Final   Comment:        THE SENSITIVITY OF THIS METHODOLOGY IS >24 mIU/mL    Preg Test, Ur 04/26/2021 NEGATIVE  NEGATIVE Final   Comment:        THE SENSITIVITY OF THIS METHODOLOGY IS >24 mIU/mL    Preg Test, Ur 04/26/2021 NEGATIVE  NEGATIVE Final   Comment:        THE SENSITIVITY OF THIS METHODOLOGY IS >24 mIU/mL   Admission on 04/22/2021, Discharged on 04/22/2021  Component Date Value Ref Range Status   Sodium 04/22/2021 144  135 - 145 mmol/L Final   Potassium 04/22/2021 3.6  3.5 - 5.1 mmol/L Final   Chloride 04/22/2021 115 (H)  98 - 111 mmol/L Final   CO2 04/22/2021 20 (L)  22 - 32 mmol/L Final   Glucose, Bld 04/22/2021 104 (H)  70 - 99 mg/dL Final   Glucose reference range applies only to samples taken after fasting for at least 8 hours.   BUN 04/22/2021 9  6 - 20 mg/dL Final   Creatinine, Ser 04/22/2021 0.86  0.44 - 1.00 mg/dL Final   Calcium 04/22/2021 9.5  8.9 - 10.3 mg/dL Final   Total Protein 04/22/2021 7.4  6.5 - 8.1 g/dL Final   Albumin 04/22/2021 3.7  3.5 - 5.0 g/dL Final   AST 04/22/2021 31  15 - 41 U/L Final   ALT 04/22/2021 21  0 - 44 U/L Final   Alkaline Phosphatase 04/22/2021 95   38 - 126 U/L Final   Total Bilirubin 04/22/2021 0.6  0.3 - 1.2 mg/dL Final   GFR, Estimated 04/22/2021 >60  >60 mL/min Final   Comment: (NOTE) Calculated using the CKD-EPI Creatinine Equation (2021)    Anion gap 04/22/2021 9  5 - 15 Final   Performed at [facility], [place] [address] [place], [place] [address]   Alcohol, Ethyl (B) 04/22/2021 276 (H)  <10 mg/dL Final   Comment: (NOTE) Lowest detectable limit for serum alcohol is 10 mg/dL.  For medical purposes only. Performed at [facility], [place] [address] [place], [place] [address]    Salicylate Lvl 30/16/0109 <7.0 (L)  7.0 - 30.0 mg/dL Final   Performed at [place] [address], [place], [place] [address]   Acetaminophen (Tylenol), Serum 04/22/2021 <10 (L)  10 - 30 ug/mL Final   Comment: (NOTE) Therapeutic concentrations vary significantly. A range of 10-30 ug/mL  may be an effective concentration for many patients. However, some  are best treated at concentrations outside of this range. Acetaminophen concentrations >150 ug/mL at 4 hours after ingestion  and >50 ug/mL at 12 hours after ingestion are often associated with  toxic reactions.  Performed at [facility], [place] [address], [place], [place] [address]    WBC 04/22/2021 3.7 (L)  4.0 - 10.5 K/uL Final   RBC 04/22/2021 4.18  3.87 - 5.11 MIL/uL Final   Hemoglobin 04/22/2021 11.6 (L)  12.0 - 15.0 g/dL Final   HCT 04/22/2021 37.0  36.0 - 46.0 % Final   MCV 04/22/2021 88.5  80.0 - 100.0 fL Final   MCH 04/22/2021 27.8  26.0 - 34.0 pg Final   MCHC 04/22/2021 31.4  30.0 - 36.0 g/dL Final   RDW 04/22/2021 17.5 (H)  11.5 - 15.5 % Final   Platelets 04/22/2021 96 (L)  150 - 400 K/uL Final   Comment: SPECIMEN CHECKED FOR CLOTS Immature Platelet Fraction may be clinically indicated, consider ordering this additional test [id number] REPEATED TO VERIFY PLATELET COUNT CONFIRMED BY SMEAR    nRBC  04/22/2021 0.0  0.0 - 0.2 % Final   Performed at [facility], [place] [address], [place], [place] [address]   Opiates 04/22/2021 NONE DETECTED  NONE DETECTED Final   Cocaine 04/22/2021 NONE DETECTED  NONE DETECTED Final   Benzodiazepines 04/22/2021 NONE DETECTED  NONE DETECTED Final   Amphetamines 04/22/2021 NONE DETECTED  NONE DETECTED Final   Tetrahydrocannabinol 04/22/2021 NONE DETECTED  NONE DETECTED Final   Barbiturates 04/22/2021 NONE DETECTED  NONE DETECTED Final   Comment: (NOTE) DRUG SCREEN FOR MEDICAL PURPOSES ONLY.  IF CONFIRMATION IS NEEDED FOR ANY PURPOSE, NOTIFY LAB WITHIN 5 DAYS.  LOWEST DETECTABLE LIMITS FOR URINE DRUG SCREEN Drug Class                     Cutoff (ng/mL) Amphetamine and metabolites    1000 Barbiturate and metabolites    200 Benzodiazepine                 865 Tricyclics and metabolites     300 Opiates and metabolites        300 Cocaine and metabolites        300 THC                            50 Performed at [facility], [place] [address] [place], [place] [address]    I-stat hCG, quantitative 04/22/2021 <5.0  <5 mIU/mL Final   Comment 3 04/22/2021          Final   Comment:   GEST. AGE      CONC.  (mIU/mL)   <=1 WEEK        5 - 50     2 WEEKS       50 - 500     3 WEEKS       100 - 10,000     4 WEEKS     1,000 - 30,000        FEMALE AND NON-PREGNANT FEMALE:     LESS THAN 5 mIU/mL    SARS Coronavirus 2 by RT PCR 04/22/2021 NEGATIVE  NEGATIVE Final   Comment: (NOTE) SARS-CoV-2 target nucleic acids are NOT DETECTED.  The SARS-CoV-2 RNA is generally detectable in upper respiratory specimens during the acute phase of infection. The lowest concentration of SARS-CoV-2 viral copies this assay can detect is 138 copies/mL. A negative result does not preclude SARS-Cov-2 infection and should not be used as the sole basis for treatment or other patient management decisions. A negative result may occur with  improper  specimen collection/handling, submission of specimen other than nasopharyngeal swab, presence of viral mutation(s) within the areas targeted by this assay, and inadequate number of viral copies(<138 copies/mL). A negative result must be combined with clinical observations, patient history, and epidemiological information. The expected result is Negative.  Fact Sheet for Patients:  EntrepreneurPulse.com.au  Fact Sheet  for Healthcare Providers:  IncredibleEmployment.be  This test is no                          t yet approved or cleared by the [place] and  has been authorized for detection and/or diagnosis of SARS-CoV-2 by FDA under an Emergency Use Authorization (EUA). This EUA will remain  in effect (meaning this test can be used) for the duration of the COVID-19 declaration under Section 564(b)(1) of the Act, 21 U.S.C.section 360bbb-3(b)(1), unless the authorization is terminated  or revoked sooner.       Influenza A by PCR 04/22/2021 NEGATIVE  NEGATIVE Final   Influenza B by PCR 04/22/2021 NEGATIVE  NEGATIVE Final   Comment: (NOTE) The Xpert Xpress SARS-CoV-2/FLU/RSV plus assay is intended as an aid in the diagnosis of influenza from Nasopharyngeal swab specimens and should not be used as a sole basis for treatment. Nasal washings and aspirates are unacceptable for Xpert Xpress SARS-CoV-2/FLU/RSV testing.  Fact Sheet for Patients: EntrepreneurPulse.com.au  Fact Sheet for Healthcare Providers: IncredibleEmployment.be  This test is not yet approved or cleared by the [place] FDA and has been authorized for detection and/or diagnosis of SARS-CoV-2 by FDA under an Emergency Use Authorization (EUA). This EUA will remain in effect (meaning this test can be used) for the duration of the COVID-19 declaration under Section 564(b)(1) of the Act, 21 U.S.C. section 360bbb-3(b)(1), unless the  authorization is terminated or revoked.  Performed at [facility], [place] [address] [place], [place] [address]     Blood Alcohol level:  Lab Results  Component Value Date   ETH 285 (H) 10/06/2021   ETH 276 (H) 95/62/1308    Metabolic Disorder Labs: Lab Results  Component Value Date   HGBA1C 5.2 01/20/2021   MPG 102.54 01/20/2021   MPG 85.32 04/21/2019   Lab Results  Component Value Date   PROLACTIN 28.7 (H) 05/04/2017   Lab Results  Component Value Date   CHOL 154 01/20/2021   TRIG 120 01/20/2021   HDL 47 01/20/2021   CHOLHDL 3.3 01/20/2021   VLDL 24 01/20/2021   LDLCALC 83 01/20/2021   LDLCALC 76 04/21/2019    Therapeutic Lab Levels: No results found for: LITHIUM No results found for: VALPROATE No components found for:  CBMZ  Physical Findings   AIMS    Flowsheet Row Admission (Discharged) from 04/19/2019 in [facility] [facility] Admission (Discharged) from 05/02/2017 in [facility] [facility]  AIMS Total Score 0 0      AUDIT    Flowsheet Row Admission (Discharged) from 04/19/2019 in [place] [facility] Admission (Discharged) from 05/02/2017 in [facility] [facility] Admission (Discharged) from 12/22/2014 in [facility] ED from 11/05/2014 in [place] DEPT Admission (Discharged) from 02/02/2014 in [facility] [facility]  Alcohol Use Disorder Identification Test Final Score (AUDIT) 34 34 29 35 38      GAD-7    Flowsheet Row Video Visit from 07/08/2021 in [facility] Video Visit from 05/13/2021 in [facility] Video Visit from 04/02/2021 in [facility] Video Visit from 02/17/2021 in [facility] Video Visit from 01/06/2021 in [facility]  Total GAD-7 Score 18 16 15 11 17       PHQ2-9    Flowsheet  [place] ED from 10/06/2021 in [place] DEPT Video Visit from 07/08/2021 in [facility] Video Visit from 05/13/2021 in [facility] Video Visit from 04/02/2021 in [facility] Video Visit from 02/17/2021 in [place]  PHQ-2 Total Score 4 2 2 3 4   PHQ-9 Total Score 15 9 10 15 15       [place] ED from 10/07/2021 in [facility] ED from 10/06/2021 in [place] DEPT Video Visit from 07/08/2021 in [place] CATEGORY Low Risk Low Risk No Risk        Musculoskeletal  Strength & Muscle Tone: within normal limits Gait & Station: normal Patient leans: N/A  Psychiatric Specialty Exam  Presentation  General Appearance: Appropriate for Environment; Disheveled (laying in bed facing towards the wall entirety of interview)  Eye Contact:None; Other (comment) (faced wall entire interview)  Speech:Clear and Coherent; Normal Rate  Speech Volume:Normal  Handedness:Right   Mood and Affect  Mood:-- ("alright, just tired")  Affect:Other (comment) (UTA due to patient facing well entirety of interview)   Thought Process  Thought Processes:Coherent; Linear  Descriptions of Associations:Intact  Orientation:Full (Time, Place and Person)  Thought Content:WDL; Logical  Diagnosis of Schizophrenia or Schizoaffective disorder in past: No    Hallucinations:Hallucinations: None  Ideas of Reference:None  Suicidal Thoughts:Suicidal Thoughts: No  Homicidal Thoughts:Homicidal Thoughts: No   Sensorium  Memory:Immediate Good; Remote Fair; Recent Good  Judgment:Fair  Insight:Fair (overall fair; somwhat lacking in regards to insight into substance use)   Executive Functions   Concentration:Good  Attention Span:Good  [place]  Language:Good   Psychomotor Activity  Psychomotor Activity:Psychomotor Activity: Normal   Assets  Assets:Housing; Desire for Improvement; Communication Skills; Social Support; Resilience   Sleep  Sleep:Sleep: Fair   No data recorded  Physical Exam  Physical Exam Constitutional:      Appearance: Normal appearance. She is normal weight.  HENT:     Head: Normocephalic and atraumatic.  Eyes:     Extraocular Movements: Extraocular movements intact.  Pulmonary:     Effort: Pulmonary effort is normal.  Neurological:     General: No focal deficit present.     Mental Status: She is alert and oriented to person, place, and time.   Review of Systems  Constitutional:  Negative for chills, diaphoresis and fever.  HENT:  Negative for hearing loss.   Eyes:  Negative for discharge and redness.  Respiratory:  Negative for cough and shortness of breath.   Cardiovascular:  Negative for chest pain.  Gastrointestinal:  Positive for nausea. Negative for abdominal pain and vomiting.  Musculoskeletal:  Negative for myalgias.  Neurological:  Positive for headaches.  Psychiatric/Behavioral:  Positive for depression and substance abuse. Negative for hallucinations and suicidal ideas.   Blood pressure 101/73, pulse 88, temperature 98.6 F (37 C), temperature source Tympanic, resp. rate 16, SpO2 91 %. There is no height or weight on file to calculate BMI.  Treatment Plan Summary: Yenifer Saccente is a 45 yo female w PMH significant for bipolar disorder, anxiety, PTSD, and alcohol use disorder who was evaluated today at the [facility] following transfer from the [facility] ED on 11/17. She has been drinking 6 beers and hand sanitizer daily for the past week, but reports a 2 week period of sobriety prior to that. She reports no significant withdrawal symptoms today, but endorsed nausea that  improved with breakfast, a slight  headache, and sweating. Most recent CIWA 0. Tolerating ativan taper well  Patient remains appropriate for continued treatment at the [facility] due to ongoing alcohol detox with Ativan taper.   Substance induced mood disorder Alcohol Use Disorder Polysubstance abuse -Continue Ativan taper; 1mg  QID on 11/17, 1mg  TID on 11/19, 1mg  BID on 11/20, and 1mg  on 11/21.  -CIWA protocol -Gabapentin 100mg  TID (started 11/17); will also address reported back pain and assist with mood/anxiety -Collateral from husband indicates that there is high concern that patient abuses prescribed Klonopin in conjunction with alcohol   Bipolar Disorder vs SIMD -Unclear if patient's reported manic/hypomanic episodes are related to primary psychiatric disorder (bipolar disorder) or related to substance use.  Patient described her manic/hypomanic episodes always beginning when she drinks and could be more c/w intoxcation vs true manic/hypomanic episode.   -Continue Seroquel 100mg  -Continue Topamax 50 mg daily, and 100mg  at bedtime -Continue Lexapro 30 mg daily   Dispo: Ongoing.  Social work Sports administrator. Pt to be discharged on Monday 11/21 so that she can attend her reported court date. Referral to alcohol treatment facility after discharge; patient specifically requests not to go to [facility]. Outpatient resources will be provided if [facility] does not accept her referral.  Patient will need prescriptions at discharge.  Ival Bible, MD 10/08/2021 1:43 PM

## 2021-10-08 NOTE — ED Notes (Signed)
Pt is in the bed sleeping. Respirations are even and unlabored. No acute distress noted. Will continue to monitor for safety. 

## 2021-10-08 NOTE — ED Notes (Signed)
Pt eating breakfast 

## 2021-10-08 NOTE — Clinical Social Work Psych Note (Signed)
CSW Initial/Update Note   Thursday - 10/07/2021  CSW met with [name] for introduction and to begin discussions regarding treatment and potential discharge planning.   Tiffiny shared that she has struggled with ETOH use for many years. She endorsed drinking 10- 12oz beers and hand sanitizer. Robecca reports that she has been drinking to "feel numb".  She reports her children are leaving home and she "doesn't know what to do". She also shared some concern regarding her husband's health.   Lynann reports she has been experiencing an increase in depressive symptoms, including suicidal ideation. She reports she has an outpatient provider at [facility] for medication management. She reports her next appointment is 11/09/21.   Theadora reports she is currently on probation for assaulting a Engineer, structural. She reports she has an upcoming on Monday, 10/11/21.   Tyreonna was accepting of substance abuse resources, however was understanding that her upcoming court date would be a barrier for placement at this time.     Friday - 10/08/2021  Eily reports feeling "alright, just tired". She denied having any SI, HI or AVH at this time. She reported sleeping "okay". She did endorse experiencing some withdrawal symptoms such as headaches, nausea and sweating.   Azjah gave CSW and Dr. Serafina Mitchell, MD verbal consent to speak with her husband, Juanna Pudlo [phone]) for collateral information.   [name] expressed extreme frustration and disappointment in his wife. [name] reports the patient is "playing a game" an has done so for the last 20 year in regards to her ETOH and substance use.  He shared various stories of the patient relapsing directly after discharging from residential and outpatient substance use services over the last 20 years. [name] stated he is "disgusted" with how the patient has "wasted" resources and services over the last 20 years.   Strained marriage for the last few years. He reports they are  suppose to be working on a 2 year separation, however the patient will not allow him to do so, to prevent divorce. [name] stated "the marriage is over, it has been over since my son turned 44yo". [name] reports the patient has been to over 25 treatment programs and has not had success in remaining sober. [name] shared that their family is "tired of it".   [name] shared that the longest period of sobriety was 8 months within the lat 20 years. He shared that the patient abuses Klonopin or other benzodiazepines, in addition to her ETOH use. [name] reports patient is "very manipulative" with providers who prescribe her medications.  [name] shared that when the patient is motivated and determined for change, it is apparent.   CSW and Dr. Serafina Mitchell, MD informed [name] that the patient's Ativan taper will end on Monday morning. [name] reports he will assist in taking the patient to her court date on Monday, 10/11/2021.   CSW will continue follow.    Radonna Ricker, MSW, LCSW Clinical Education officer, museum ([place]) [facility]
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
## 2021-10-08 NOTE — ED Notes (Signed)
Pt sleeping in no acute distress. RR even and unlabored. Safety maintained. 

## 2021-10-08 NOTE — ED Notes (Addendum)
Pt eating lunch

## 2021-10-08 NOTE — ED Notes (Signed)
Patient took all her medications at 9:30. She stayed in the day room for about 20 minutes and is now in bed resting with eyes closed. Will continue to monitor for safety.

## 2021-10-08 NOTE — Progress Notes (Signed)
Patient is alert and oriented X 3, denies SI, HI and AVH this morning. Patient complains of nausea this morning morning medications given as part as CIWA protocol. Patient is pleasant and appropriate on the unit with staff and peers at this time.

## 2021-10-08 NOTE — ED Notes (Addendum)
Pt sleeping in no acute distress. RR even and unlabored. Safety maintained. 

## 2021-10-08 NOTE — Progress Notes (Signed)
Spirituality group facilitated by Simone Curia, MDiv, BCC.  Group Description: Group focused on topic of hope. Patients participated in facilitated discussion around topic, connecting with one another around experiences and definitions for hope. Group members engaged with visual explorer photos, reflecting on what hope looks like for them today. Group engaged in discussion around how their definitions of hope are present today in hospital.  Modalities: Psycho-social ed, [place]lerian, Narrative, MI  Patient Progress:  Sara [name] was present throughout group.  Engaged in group discussion with facilitator and group members with appropriate affect throughout.  Identified definitions and sources of hope connected their life.  Identified plans for practicing connection to sources of hope.

## 2021-10-08 NOTE — ED Notes (Signed)
Pt is currently sleeping, no distress noted, environmental check complete, will continue to monitor patient for safety. ? ?

## 2021-10-08 NOTE — ED Notes (Signed)
Patient resting quietly in bed with eyes closed. No signs/symptoms of distress. Will continue to monitor for safety.

## 2021-10-08 NOTE — ED Notes (Signed)
Patient in day room and requested a snack. Sittingin day room with other patients and quietly reading a book.

## 2021-10-09 DIAGNOSIS — Z87891 Personal history of nicotine dependence: Secondary | ICD-10-CM | POA: Diagnosis not present

## 2021-10-09 DIAGNOSIS — F4310 Post-traumatic stress disorder, unspecified: Secondary | ICD-10-CM | POA: Diagnosis not present

## 2021-10-09 DIAGNOSIS — F1094 Alcohol use, unspecified with alcohol-induced mood disorder: Secondary | ICD-10-CM | POA: Diagnosis not present

## 2021-10-09 MED ORDER — TOPIRAMATE 100 MG PO TABS
100.0000 mg | ORAL_TABLET | Freq: Every day | ORAL | 1 refills | Status: DC
  Filled 2021-10-27: qty 30, 30d supply, fill #0

## 2021-10-09 MED ORDER — GABAPENTIN 100 MG PO CAPS
100.0000 mg | ORAL_CAPSULE | Freq: Three times a day (TID) | ORAL | 1 refills | Status: DC
  Filled 2021-10-27: qty 90, 30d supply, fill #0

## 2021-10-09 MED ORDER — ESCITALOPRAM OXALATE 10 MG PO TABS: 30.0000 mg | ORAL_TABLET | Freq: Every day | ORAL | 0 refills | Status: DC

## 2021-10-09 MED ORDER — GABAPENTIN 100 MG PO CAPS: 100.0000 mg | ORAL_CAPSULE | Freq: Three times a day (TID) | ORAL | 0 refills | Status: DC

## 2021-10-09 MED ORDER — CLONAZEPAM 0.5 MG PO TABS: 0.5000 mg | ORAL_TABLET | Freq: Two times a day (BID) | ORAL | 0 refills | Status: DC | PRN

## 2021-10-09 MED ORDER — ESCITALOPRAM OXALATE 10 MG PO TABS
30.0000 mg | ORAL_TABLET | Freq: Every day | ORAL | 1 refills | Status: DC
  Filled 2021-10-27: qty 90, 30d supply, fill #0

## 2021-10-09 MED ORDER — TOPIRAMATE 50 MG PO TABS
50.0000 mg | ORAL_TABLET | Freq: Every day | ORAL | 0 refills | Status: DC
Start: 2021-10-09 — End: 2021-11-09

## 2021-10-09 MED ORDER — QUETIAPINE FUMARATE 100 MG PO TABS: 100.0000 mg | ORAL_TABLET | Freq: Every day | ORAL | 0 refills | Status: DC

## 2021-10-09 MED ORDER — TOPIRAMATE 50 MG PO TABS
50.0000 mg | ORAL_TABLET | Freq: Every day | ORAL | 1 refills | Status: DC
  Filled 2021-10-27: qty 30, 30d supply, fill #0

## 2021-10-09 MED ORDER — TOPIRAMATE 100 MG PO TABS: 100.0000 mg | ORAL_TABLET | Freq: Every day | ORAL | 0 refills | Status: DC

## 2021-10-09 MED ORDER — QUETIAPINE FUMARATE 100 MG PO TABS
100.0000 mg | ORAL_TABLET | Freq: Every day | ORAL | 1 refills | Status: DC
  Filled 2021-10-27: qty 30, 30d supply, fill #0
  Filled 2021-11-22: qty 30, 30d supply, fill #1

## 2021-10-09 NOTE — ED Notes (Signed)
Patient is resting with eyes closed, no S/S of distress, respiration even and unlabored. Will continue to monitor for safety.
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
## 2021-10-09 NOTE — Progress Notes (Signed)
Patient remains alert and oriented X 3, denies SI, HI and AVH. Patient participated in groups today, appropriate on unit with staff and peers. Patient showered and appetite has been adequate on day shift. Vitals on shift has been WNL. Nursing staff will continue to monitor.

## 2021-10-09 NOTE — Progress Notes (Addendum)
Patient CIWA is 0 at this time. Patient is attending group, no signs objective detox symptoms, patient denies symptoms. Nursing staff will continue to monitor.

## 2021-10-09 NOTE — Progress Notes (Signed)
Patient is alert and oriented X 3, denies SI, HI and AVH at this time. Pain 0/10. Patient has no concerns, discussed with RN a court date on Monday and plan to discharge Monday in order to attend court.  Nursing staff will continue to monitor.

## 2021-10-09 NOTE — ED Provider Notes (Signed)
Behavioral Health Progress Note  Date and Time: 10/09/2021 3:40 PM Name: Sara [name] MRN:  [id number]  Subjective:  Sara [name], 45 y.o., female patient seen face to face by this provider and consulted with Dr. Serafina Mitchell; and chart reviewed on 10/09/21.  Per chart review she has a history of bipolar, anxiety, ptsd and alcohol use disorder. She was admitted to the [facility] for alcohol detox. She has a court appointment for Monday, 10/11/2021. She is currently on an Ativan taper and it will be complete on 10/11/2021. Disposition has been discussed with patient and discharge planning is set for 10/11/2021.  During evaluation Sara [name] is laying in her bed. She awakens easily. She is disheveled ad makes fleeting eye contact. She is alert/oriented x 4; calm/cooperative. She is depressed with congruent affect. Denies any concerns with appetite or sleep. Her thought process is coherent and relevant. She does not appear to be responding to internal/external stimuli. She denies auditory/visual hallucinations, delusional thought content or paranoia. Patient denies suicidal/self-harm/homicidal ideation. She denies any withdrawal symptoms or health concerns. Reports tolerating medications with out any adverse reactions. Reports feeling sleepy due to ativan. States she is attending/participating in groups. Provided reassurance and encouragement.     Diagnosis:  Final diagnoses:  Alcohol use with alcohol-induced mood disorder (HCC)  Post traumatic stress disorder (PTSD)    Total Time spent with patient: 20 minutes  Past Psychiatric History: see h&p Past Medical History:  Past Medical History:  Diagnosis Date   Alcohol abuse    Alcoholism ([place])    Anemia    Anxiety    Blood transfusion without reported diagnosis    Cirrhosis ([place])    Depression    Esophageal varices with bleeding(456.0) 06/13/2014   GERD (gastroesophageal reflux disease)    Heart murmur    Patient states  she may have   Menorrhagia    Pancytopenia ([place]) 01/15/2014   Pneumonia    Portal hypertension ([place])    S/P alcohol detoxification    2-3 days at behavioral health previously   UGI bleed 06/12/2014    Past Surgical History:  Procedure Laterality Date   CHOLECYSTECTOMY     ESOPHAGOGASTRODUODENOSCOPY N/A 06/12/2014   Procedure: ESOPHAGOGASTRODUODENOSCOPY (EGD);  Surgeon: Gatha Mayer, MD;  Location: [facility];  Service: Endoscopy;  Laterality: N/A;   ESOPHAGOGASTRODUODENOSCOPY (EGD) WITH PROPOFOL N/A 07/29/2014   Procedure: ESOPHAGOGASTRODUODENOSCOPY (EGD) WITH PROPOFOL;  Surgeon: Inda Castle, MD;  Location: [facility];  Service: Endoscopy;  Laterality: N/A;   ESOPHAGOGASTRODUODENOSCOPY (EGD) WITH PROPOFOL N/A 01/20/2018   Procedure: ESOPHAGOGASTRODUODENOSCOPY (EGD) WITH PROPOFOL;  Surgeon: Mauri Pole, MD;  Location: [facility];  Service: Endoscopy;  Laterality: N/A;   Family History:  Family History  Problem Relation Age of Onset   Colon polyps Mother    Hypertension Mother    Thyroid disease Mother    Alcoholism Mother    Alcoholism Father    Alcohol abuse Maternal Grandfather    Alcohol abuse Paternal Grandfather    Alcohol abuse Paternal Aunt    Alcohol abuse Maternal Uncle    Family Psychiatric  History: see h&p Social History:  Social History   Substance and Sexual Activity  Alcohol Use Yes   Alcohol/week: 0.0 standard drinks   Comment: Usually drinks 2-3 bottles of wine daily when drinking.      Social History   Substance and Sexual Activity  Drug Use No   Types: Cocaine, Marijuana   Comment: denies    Social History  Socioeconomic History   Marital status: Married    Spouse name: [name]   Number of children: 2   Years of education: Associates   Highest education level: Not on file  Occupational History   Occupation: Paralegal  Tobacco Use   Smoking status: Former    Packs/day: 0.25    Types: Cigarettes    Quit date: 11/22/2018     Years since quitting: 2.8   Smokeless tobacco: Never  Substance and Sexual Activity   Alcohol use: Yes    Alcohol/week: 0.0 standard drinks    Comment: Usually drinks 2-3 bottles of wine daily when drinking.    Drug use: No    Types: Cocaine, Marijuana    Comment: denies   Sexual activity: Never    Birth control/protection: None  Other Topics Concern   Not on file  Social History Narrative   Lives with husband and son. Daughter lives nearby. 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   Social Determinants of Health   Financial Resource Strain: Not on file  Food Insecurity: Not on file  Transportation Needs: Not on file  Physical Activity: Not on file  Stress: Not on file  Social Connections: Not on file   SDOH:  SDOH Screenings   Alcohol Screen: Not on file  Depression (PHQ2-9): Medium Risk   PHQ-2 Score: 15  Financial Resource Strain: Not on file  Food Insecurity: Not on file  Housing: Not on file  Physical Activity: Not on file  Social Connections: Not on file  Stress: Not on file  Tobacco Use: Medium Risk   Smoking Tobacco Use: Former   Smokeless Tobacco Use: Never   Passive Exposure: Not on file  Transportation Needs: Not on file   Additional Social History:      Sleep: Good  Appetite:  Good  Current Medications:  Current Facility-Administered Medications  Medication Dose Route Frequency Provider Last Rate Last Admin   alum & mag hydroxide-simeth (MAALOX/MYLANTA) 200-200-20 MG/5ML suspension 30 mL  30 mL Oral Q4H PRN Ival Bible, MD       escitalopram (LEXAPRO) tablet 30 mg  30 mg Oral Daily Suella Broad, FNP   30 mg at 10/09/21 0856   gabapentin (NEURONTIN) capsule 100 mg  100 mg Oral TID Ival Bible, MD   100 mg at 10/09/21 1533   hydrOXYzine (ATARAX/VISTARIL) tablet 25 mg  25 mg Oral TID PRN Ival Bible, MD       hydrOXYzine (ATARAX/VISTARIL) tablet 25 mg  25 mg Oral Q6H PRN Ival Bible,  MD       loperamide (IMODIUM) capsule 2-4 mg  2-4 mg Oral PRN Ival Bible, MD       LORazepam (ATIVAN) tablet 1 mg  1 mg Oral Q6H PRN Ival Bible, MD       LORazepam (ATIVAN) tablet 1 mg  1 mg Oral TID Ival Bible, MD   1 mg at 10/09/21 1533   Followed by   Derrill Memo ON 10/10/2021] LORazepam (ATIVAN) tablet 1 mg  1 mg Oral BID Ival Bible, MD       magnesium hydroxide (MILK OF MAGNESIA) suspension 30 mL  30 mL Oral Daily PRN Ival Bible, MD       multivitamin with minerals tablet 1 tablet  1 tablet Oral Daily Ival Bible, MD   1 tablet at 10/09/21 0856   ondansetron (ZOFRAN-ODT) disintegrating tablet 4 mg  4 mg Oral Q6H PRN  Ival Bible, MD       QUEtiapine (SEROQUEL) tablet 100 mg  100 mg Oral QHS Suella Broad, FNP   100 mg at 10/08/21 2151   thiamine tablet 100 mg  100 mg Oral Daily Ival Bible, MD   100 mg at 10/09/21 0856   topiramate (TOPAMAX) tablet 100 mg  100 mg Oral QHS Suella Broad, FNP   100 mg at 10/08/21 2151   topiramate (TOPAMAX) tablet 50 mg  50 mg Oral Daily Ival Bible, MD   50 mg at 10/09/21 0856   traZODone (DESYREL) tablet 50 mg  50 mg Oral QHS PRN Ival Bible, MD       Current Outpatient Medications  Medication Sig Dispense Refill   escitalopram (LEXAPRO) 10 MG tablet Take 3 tablets (30 mg total) by mouth daily. 42 tablet 0   gabapentin (NEURONTIN) 100 MG capsule Take 1 capsule (100 mg total) by mouth 3 (three) times daily. 42 capsule 0   QUEtiapine (SEROQUEL) 100 MG tablet Take 1 tablet (100 mg total) by mouth at bedtime. 14 tablet 0   topiramate (TOPAMAX) 100 MG tablet Take 1 tablet (100 mg total) by mouth at bedtime. 14 tablet 0   topiramate (TOPAMAX) 50 MG tablet Take 1 tablet (50 mg total) by mouth daily. 14 tablet 0   clonazePAM (KLONOPIN) 0.5 MG tablet Take 1 tablet (0.5 mg total) by mouth 2 (two) times daily as needed for anxiety. 60 tablet 0   [START ON  10/10/2021] escitalopram (LEXAPRO) 10 MG tablet Take 3 tablets (30 mg total) by mouth daily. 90 tablet 1   gabapentin (NEURONTIN) 100 MG capsule Take 1 capsule (100 mg total) by mouth 3 (three) times daily. 90 capsule 1   QUEtiapine (SEROQUEL) 100 MG tablet Take 1 tablet (100 mg total) by mouth at bedtime. 30 tablet 1   topiramate (TOPAMAX) 100 MG tablet Take 1 tablet (100 mg total) by mouth at bedtime. 30 tablet 1   topiramate (TOPAMAX) 50 MG tablet Take 1 tablet (50 mg total) by mouth daily. 30 tablet 1    Labs  Lab Results:  Admission on 10/07/2021  Component Date Value Ref Range Status   POC Amphetamine UR 10/07/2021 None Detected  NONE DETECTED (Cut Off Level 1000 ng/mL) Final   POC Secobarbital (BAR) 10/07/2021 None Detected  NONE DETECTED (Cut Off Level 300 ng/mL) Final   POC Buprenorphine (BUP) 10/07/2021 None Detected  NONE DETECTED (Cut Off Level 10 ng/mL) Final   POC Oxazepam (BZO) 10/07/2021 Positive (A)  NONE DETECTED (Cut Off Level 300 ng/mL) Final   POC Cocaine UR 10/07/2021 None Detected  NONE DETECTED (Cut Off Level 300 ng/mL) Final   POC Methamphetamine UR 10/07/2021 None Detected  NONE DETECTED (Cut Off Level 1000 ng/mL) Final   POC Morphine 10/07/2021 None Detected  NONE DETECTED (Cut Off Level 300 ng/mL) Final   POC Oxycodone UR 10/07/2021 None Detected  NONE DETECTED (Cut Off Level 100 ng/mL) Final   POC Methadone UR 10/07/2021 None Detected  NONE DETECTED (Cut Off Level 300 ng/mL) Final   POC Marijuana UR 10/07/2021 None Detected  NONE DETECTED (Cut Off Level 50 ng/mL) Final  Admission on 10/06/2021, Discharged on 10/07/2021  Component Date Value Ref Range Status   Lipase 10/06/2021 93 (H)  11 - 51 U/L Final   Performed at [facility], [place] [address] [place], [place] [address]   Sodium 10/06/2021 140  135 - 145 mmol/L Final  Potassium 10/06/2021 3.3 (L)  3.5 - 5.1 mmol/L Final   Chloride 10/06/2021 110  98 - 111 mmol/L Final   CO2 10/06/2021 20  (L)  22 - 32 mmol/L Final   Glucose, Bld 10/06/2021 119 (H)  70 - 99 mg/dL Final   Glucose reference range applies only to samples taken after fasting for at least 8 hours.   BUN 10/06/2021 9  6 - 20 mg/dL Final   Creatinine, Ser 10/06/2021 0.52  0.44 - 1.00 mg/dL Final   Calcium 10/06/2021 8.4 (L)  8.9 - 10.3 mg/dL Final   Total Protein 10/06/2021 7.2  6.5 - 8.1 g/dL Final   Albumin 10/06/2021 3.1 (L)  3.5 - 5.0 g/dL Final   AST 10/06/2021 49 (H)  15 - 41 U/L Final   ALT 10/06/2021 24  0 - 44 U/L Final   Alkaline Phosphatase 10/06/2021 88  38 - 126 U/L Final   Total Bilirubin 10/06/2021 0.8  0.3 - 1.2 mg/dL Final   GFR, Estimated 10/06/2021 >60  >60 mL/min Final   Comment: (NOTE) Calculated using the CKD-EPI Creatinine Equation (2021)    Anion gap 10/06/2021 10  5 - 15 Final   Performed at [facility], [place] [address], [place], [place] [address]   WBC 10/06/2021 2.2 (L)  4.0 - 10.5 K/uL Final   RBC 10/06/2021 3.93  3.87 - 5.11 MIL/uL Final   Hemoglobin 10/06/2021 11.4 (L)  12.0 - 15.0 g/dL Final   HCT 10/06/2021 35.1 (L)  36.0 - 46.0 % Final   MCV 10/06/2021 89.3  80.0 - 100.0 fL Final   MCH 10/06/2021 29.0  26.0 - 34.0 pg Final   MCHC 10/06/2021 32.5  30.0 - 36.0 g/dL Final   RDW 10/06/2021 17.4 (H)  11.5 - 15.5 % Final   Platelets 10/06/2021 76 (L)  150 - 400 K/uL Final   Comment: SPECIMEN CHECKED FOR CLOTS Immature Platelet Fraction may be clinically indicated, consider ordering this additional test [id number] REPEATED TO VERIFY PLATELET COUNT CONFIRMED BY SMEAR    nRBC 10/06/2021 0.0  0.0 - 0.2 % Final   Performed at [facility], [place] [address] [place], [place] [address]   Color, Urine 10/07/2021 YELLOW  YELLOW Final   APPearance 10/07/2021 CLOUDY (A)  CLEAR Final   Specific Gravity, Urine 10/07/2021 1.011  1.005 - 1.030 Final   pH 10/07/2021 5.0  5.0 - 8.0 Final   Glucose, UA 10/07/2021 NEGATIVE  NEGATIVE mg/dL Final   Hgb urine  dipstick 10/07/2021 SMALL (A)  NEGATIVE Final   Bilirubin Urine 10/07/2021 NEGATIVE  NEGATIVE Final   Ketones, ur 10/07/2021 5 (A)  NEGATIVE mg/dL Final   Protein, ur 10/07/2021 NEGATIVE  NEGATIVE mg/dL Final   Nitrite 10/07/2021 NEGATIVE  NEGATIVE Final   Leukocytes,Ua 10/07/2021 LARGE (A)  NEGATIVE Final   RBC / HPF 10/07/2021 6-10  0 - 5 RBC/hpf Final   WBC, UA 10/07/2021 11-20  0 - 5 WBC/hpf Final   Bacteria, UA 10/07/2021 MANY (A)  NONE SEEN Final   Squamous Epithelial / LPF 10/07/2021 11-20  0 - 5 Final   Mucus 10/07/2021 PRESENT   Final   Performed at [facility], [place] [address] [place], [place] [address]   I-stat hCG, quantitative 10/06/2021 <5.0  <5 mIU/mL Final   Comment 3 10/06/2021          Final   Comment:   GEST. AGE      CONC.  (mIU/mL)   <=  1 WEEK        5 - 50     2 WEEKS       50 - 500     3 WEEKS       100 - 10,000     4 WEEKS     1,000 - 30,000        FEMALE AND NON-PREGNANT FEMALE:     LESS THAN 5 mIU/mL    Alcohol, Ethyl (B) 10/06/2021 285 (H)  <10 mg/dL Final   Comment: (NOTE) Lowest detectable limit for serum alcohol is 10 mg/dL.  For medical purposes only. Performed at [facility], [place] [address] [place], [place] [address]    SARS Coronavirus 2 by RT PCR 10/07/2021 NEGATIVE  NEGATIVE Final   Comment: (NOTE) SARS-CoV-2 target nucleic acids are NOT DETECTED.  The SARS-CoV-2 RNA is generally detectable in upper respiratory specimens during the acute phase of infection. The lowest concentration of SARS-CoV-2 viral copies this assay can detect is 138 copies/mL. A negative result does not preclude SARS-Cov-2 infection and should not be used as the sole basis for treatment or other patient management decisions. A negative result may occur with  improper specimen collection/handling, submission of specimen other than nasopharyngeal swab, presence of viral mutation(s) within the areas targeted by this assay, and  inadequate number of viral copies(<138 copies/mL). A negative result must be combined with clinical observations, patient history, and epidemiological information. The expected result is Negative.  Fact Sheet for Patients:  EntrepreneurPulse.com.au  Fact Sheet for Healthcare Providers:  IncredibleEmployment.be  This test is no                          t yet approved or cleared by the [place] FDA and  has been authorized for detection and/or diagnosis of SARS-CoV-2 by FDA under an Emergency Use Authorization (EUA). This EUA will remain  in effect (meaning this test can be used) for the duration of the COVID-19 declaration under Section 564(b)(1) of the Act, 21 U.S.C.section 360bbb-3(b)(1), unless the authorization is terminated  or revoked sooner.       Influenza A by PCR 10/07/2021 NEGATIVE  NEGATIVE Final   Influenza B by PCR 10/07/2021 NEGATIVE  NEGATIVE Final   Comment: (NOTE) The Xpert Xpress SARS-CoV-2/FLU/RSV plus assay is intended as an aid in the diagnosis of influenza from Nasopharyngeal swab specimens and should not be used as a sole basis for treatment. Nasal washings and aspirates are unacceptable for Xpert Xpress SARS-CoV-2/FLU/RSV testing.  Fact Sheet for Patients: EntrepreneurPulse.com.au  Fact Sheet for Healthcare Providers: IncredibleEmployment.be  This test is not yet approved or cleared by the [place] FDA and has been authorized for detection and/or diagnosis of SARS-CoV-2 by FDA under an Emergency Use Authorization (EUA). This EUA will remain in effect (meaning this test can be used) for the duration of the COVID-19 declaration under Section 564(b)(1) of the Act, 21 U.S.C. section 360bbb-3(b)(1), unless the authorization is terminated or revoked.  Performed at [facility], [place] [address], [place], [place] [address]   Appointment on 06/10/2021   Component Date Value Ref Range Status   WBC Count 06/10/2021 2.2 (L)  4.0 - 10.5 K/uL Final   RBC 06/10/2021 3.97  3.87 - 5.11 MIL/uL Final   Hemoglobin 06/10/2021 11.9 (L)  12.0 - 15.0 g/dL Final   HCT 06/10/2021 35.6 (L)  36.0 - 46.0 % Final   MCV 06/10/2021 89.7  80.0 -  100.0 fL Final   MCH 06/10/2021 30.0  26.0 - 34.0 pg Final   MCHC 06/10/2021 33.4  30.0 - 36.0 g/dL Final   RDW 06/10/2021 18.1 (H)  11.5 - 15.5 % Final   Platelet Count 06/10/2021 50 (L)  150 - 400 K/uL Final   Comment: Immature Platelet Fraction may be clinically indicated, consider ordering this additional test [id number]    nRBC 06/10/2021 0.0  0.0 - 0.2 % Final   Neutrophils Relative % 06/10/2021 43  % Final   Neutro Abs 06/10/2021 1.0 (L)  1.7 - 7.7 K/uL Final   Lymphocytes Relative 06/10/2021 42  % Final   Lymphs Abs 06/10/2021 0.9  0.7 - 4.0 K/uL Final   Monocytes Relative 06/10/2021 13  % Final   Monocytes Absolute 06/10/2021 0.3  0.1 - 1.0 K/uL Final   Eosinophils Relative 06/10/2021 1  % Final   Eosinophils Absolute 06/10/2021 0.0  0.0 - 0.5 K/uL Final   Basophils Relative 06/10/2021 1  % Final   Basophils Absolute 06/10/2021 0.0  0.0 - 0.1 K/uL Final   Immature Granulocytes 06/10/2021 0  % Final   Abs Immature Granulocytes 06/10/2021 0.00  0.00 - 0.07 K/uL Final   Performed at [facility], [place] [address] [place], [place] [address]   Ferritin 06/10/2021 10 (L)  11 - 307 ng/mL Final   Performed at [place] [address], [place], [place] [address]   Iron 06/10/2021 86  28 - 170 ug/dL Final   TIBC 06/10/2021 510 (H)  250 - 450 ug/dL Final   Saturation Ratios 06/10/2021 17  10.4 - 31.8 % Final   UIBC 06/10/2021 424  ug/dL Final   Performed at [place] [address] [place], [place] [address]  Admission on 05/31/2021, Discharged on 05/31/2021  Component Date Value Ref Range Status   Sodium 05/31/2021 143  135 - 145 mmol/L  Final   Potassium 05/31/2021 3.6  3.5 - 5.1 mmol/L Final   Chloride 05/31/2021 114 (H)  98 - 111 mmol/L Final   CO2 05/31/2021 21 (L)  22 - 32 mmol/L Final   Glucose, Bld 05/31/2021 99  70 - 99 mg/dL Final   Glucose reference range applies only to samples taken after fasting for at least 8 hours.   BUN 05/31/2021 <5 (L)  6 - 20 mg/dL Final   Creatinine, Ser 05/31/2021 0.46  0.44 - 1.00 mg/dL Final   Calcium 05/31/2021 8.4 (L)  8.9 - 10.3 mg/dL Final   GFR, Estimated 05/31/2021 >60  >60 mL/min Final   Comment: (NOTE) Calculated using the CKD-EPI Creatinine Equation (2021)    Anion gap 05/31/2021 8  5 - 15 Final   Performed at [facility], [place] [address] [place], [place] [address]   WBC 05/31/2021 2.1 (L)  4.0 - 10.5 K/uL Final   RBC 05/31/2021 4.07  3.87 - 5.11 MIL/uL Final   Hemoglobin 05/31/2021 11.8 (L)  12.0 - 15.0 g/dL Final   HCT 05/31/2021 36.6  36.0 - 46.0 % Final   MCV 05/31/2021 89.9  80.0 - 100.0 fL Final   MCH 05/31/2021 29.0  26.0 - 34.0 pg Final   MCHC 05/31/2021 32.2  30.0 - 36.0 g/dL Final   RDW 05/31/2021 18.5 (H)  11.5 - 15.5 % Final   Platelets 05/31/2021 78 (L)  150 - 400 K/uL Final   Comment: Immature Platelet Fraction may be clinically indicated, consider ordering this additional test  [id number]    nRBC 05/31/2021 0.0  0.0 - 0.2 % Final   Neutrophils Relative % 05/31/2021 37  % Final   Neutro Abs 05/31/2021 0.8 (L)  1.7 - 7.7 K/uL Final   Lymphocytes Relative 05/31/2021 55  % Final   Lymphs Abs 05/31/2021 1.2  0.7 - 4.0 K/uL Final   Monocytes Relative 05/31/2021 5  % Final   Monocytes Absolute 05/31/2021 0.1  0.1 - 1.0 K/uL Final   Eosinophils Relative 05/31/2021 1  % Final   Eosinophils Absolute 05/31/2021 0.0  0.0 - 0.5 K/uL Final   Basophils Relative 05/31/2021 1  % Final   Basophils Absolute 05/31/2021 0.0  0.0 - 0.1 K/uL Final   Immature Granulocytes 05/31/2021 1  % Final   Abs Immature Granulocytes 05/31/2021 0.01  0.00 - 0.07  K/uL Final   Polychromasia 05/31/2021 PRESENT   Final   Performed at [facility], [place] [address] [place], [place] [address]   SARS Coronavirus 2 by RT PCR 05/31/2021 NEGATIVE  NEGATIVE Final   Comment: (NOTE) SARS-CoV-2 target nucleic acids are NOT DETECTED.  The SARS-CoV-2 RNA is generally detectable in upper respiratory specimens during the acute phase of infection. The lowest concentration of SARS-CoV-2 viral copies this assay can detect is 138 copies/mL. A negative result does not preclude SARS-Cov-2 infection and should not be used as the sole basis for treatment or other patient management decisions. A negative result may occur with  improper specimen collection/handling, submission of specimen other than nasopharyngeal swab, presence of viral mutation(s) within the areas targeted by this assay, and inadequate number of viral copies(<138 copies/mL). A negative result must be combined with clinical observations, patient history, and epidemiological information. The expected result is Negative.  Fact Sheet for Patients:  EntrepreneurPulse.com.au  Fact Sheet for Healthcare Providers:  IncredibleEmployment.be  This test is no                          t yet approved or cleared by the [place] FDA and  has been authorized for detection and/or diagnosis of SARS-CoV-2 by FDA under an Emergency Use Authorization (EUA). This EUA will remain  in effect (meaning this test can be used) for the duration of the COVID-19 declaration under Section 564(b)(1) of the Act, 21 U.S.C.section 360bbb-3(b)(1), unless the authorization is terminated  or revoked sooner.       Influenza A by PCR 05/31/2021 NEGATIVE  NEGATIVE Final   Influenza B by PCR 05/31/2021 NEGATIVE  NEGATIVE Final   Comment: (NOTE) The Xpert Xpress SARS-CoV-2/FLU/RSV plus assay is intended as an aid in the diagnosis of influenza from Nasopharyngeal swab specimens  and should not be used as a sole basis for treatment. Nasal washings and aspirates are unacceptable for Xpert Xpress SARS-CoV-2/FLU/RSV testing.  Fact Sheet for Patients: EntrepreneurPulse.com.au  Fact Sheet for Healthcare Providers: IncredibleEmployment.be  This test is not yet approved or cleared by the [place] FDA and has been authorized for detection and/or diagnosis of SARS-CoV-2 by FDA under an Emergency Use Authorization (EUA). This EUA will remain in effect (meaning this test can be used) for the duration of the COVID-19 declaration under Section 564(b)(1) of the Act, 21 U.S.C. section 360bbb-3(b)(1), unless the authorization is terminated or revoked.  Performed at [facility], [place] [address] [place], [place] [address]    Troponin I (High Sensitivity) 05/31/2021 <2  <18 ng/L Final   Comment: (NOTE) Elevated high sensitivity troponin  I (hsTnI) values and significant  changes across serial measurements may suggest ACS but many other  chronic and acute conditions are known to elevate hsTnI results.  Refer to the Links section for chest pain algorithms and additional  guidance. Performed at [facility], [place] [address] [place], [place] [address]    Path Review 05/31/2021 Reviewed By Violet Baldy, M.D.   Final   Comment: 06/01/21 Pancytopenia. Performed at [facility], [place] [address] [place], [place] [address]   Admission on 04/26/2021, Discharged on 04/26/2021  Component Date Value Ref Range Status   Preg Test, Ur 04/26/2021 NEGATIVE  NEGATIVE Final   Comment:        THE SENSITIVITY OF THIS METHODOLOGY IS >24 mIU/mL    Preg Test, Ur 04/26/2021 NEGATIVE  NEGATIVE Final   Comment:        THE SENSITIVITY OF THIS METHODOLOGY IS >24 mIU/mL    Preg Test, Ur 04/26/2021 NEGATIVE  NEGATIVE Final   Comment:        THE SENSITIVITY OF THIS METHODOLOGY IS >24 mIU/mL    Admission on 04/22/2021, Discharged on 04/22/2021  Component Date Value Ref Range Status   Sodium 04/22/2021 144  135 - 145 mmol/L Final   Potassium 04/22/2021 3.6  3.5 - 5.1 mmol/L Final   Chloride 04/22/2021 115 (H)  98 - 111 mmol/L Final   CO2 04/22/2021 20 (L)  22 - 32 mmol/L Final   Glucose, Bld 04/22/2021 104 (H)  70 - 99 mg/dL Final   Glucose reference range applies only to samples taken after fasting for at least 8 hours.   BUN 04/22/2021 9  6 - 20 mg/dL Final   Creatinine, Ser 04/22/2021 0.86  0.44 - 1.00 mg/dL Final   Calcium 04/22/2021 9.5  8.9 - 10.3 mg/dL Final   Total Protein 04/22/2021 7.4  6.5 - 8.1 g/dL Final   Albumin 04/22/2021 3.7  3.5 - 5.0 g/dL Final   AST 04/22/2021 31  15 - 41 U/L Final   ALT 04/22/2021 21  0 - 44 U/L Final   Alkaline Phosphatase 04/22/2021 95  38 - 126 U/L Final   Total Bilirubin 04/22/2021 0.6  0.3 - 1.2 mg/dL Final   GFR, Estimated 04/22/2021 >60  >60 mL/min Final   Comment: (NOTE) Calculated using the CKD-EPI Creatinine Equation (2021)    Anion gap 04/22/2021 9  5 - 15 Final   Performed at [facility], [place] [address] [place], [place] [address]   Alcohol, Ethyl (B) 04/22/2021 276 (H)  <10 mg/dL Final   Comment: (NOTE) Lowest detectable limit for serum alcohol is 10 mg/dL.  For medical purposes only. Performed at [facility] [facility], [place] [address], [place], [place] [address]    Salicylate Lvl 42/59/5638 <7.0 (L)  7.0 - 30.0 mg/dL Final   Performed at [facility] [address], [place], [place] [address]   Acetaminophen (Tylenol), Serum 04/22/2021 <10 (L)  10 - 30 ug/mL Final   Comment: (NOTE) Therapeutic concentrations vary significantly. A range of 10-30 ug/mL  may be an effective concentration for many patients. However, some  are best treated at concentrations outside of this range. Acetaminophen concentrations >150 ug/mL at 4 hours after ingestion  and >50 ug/mL at 12 hours  after ingestion are often associated with  toxic reactions.  Performed at [facility], [place] [address] [place], [place] [address]    WBC 04/22/2021 3.7 (L)  4.0 -  10.5 K/uL Final   RBC 04/22/2021 4.18  3.87 - 5.11 MIL/uL Final   Hemoglobin 04/22/2021 11.6 (L)  12.0 - 15.0 g/dL Final   HCT 04/22/2021 37.0  36.0 - 46.0 % Final   MCV 04/22/2021 88.5  80.0 - 100.0 fL Final   MCH 04/22/2021 27.8  26.0 - 34.0 pg Final   MCHC 04/22/2021 31.4  30.0 - 36.0 g/dL Final   RDW 04/22/2021 17.5 (H)  11.5 - 15.5 % Final   Platelets 04/22/2021 96 (L)  150 - 400 K/uL Final   Comment: SPECIMEN CHECKED FOR CLOTS Immature Platelet Fraction may be clinically indicated, consider ordering this additional test [id number] REPEATED TO VERIFY PLATELET COUNT CONFIRMED BY SMEAR    nRBC 04/22/2021 0.0  0.0 - 0.2 % Final   Performed at [facility], [place] [address], [place], [place] [address]   Opiates 04/22/2021 NONE DETECTED  NONE DETECTED Final   Cocaine 04/22/2021 NONE DETECTED  NONE DETECTED Final   Benzodiazepines 04/22/2021 NONE DETECTED  NONE DETECTED Final   Amphetamines 04/22/2021 NONE DETECTED  NONE DETECTED Final   Tetrahydrocannabinol 04/22/2021 NONE DETECTED  NONE DETECTED Final   Barbiturates 04/22/2021 NONE DETECTED  NONE DETECTED Final   Comment: (NOTE) DRUG SCREEN FOR MEDICAL PURPOSES ONLY.  IF CONFIRMATION IS NEEDED FOR ANY PURPOSE, NOTIFY LAB WITHIN 5 DAYS.  LOWEST DETECTABLE LIMITS FOR URINE DRUG SCREEN Drug Class                     Cutoff (ng/mL) Amphetamine and metabolites    1000 Barbiturate and metabolites    200 Benzodiazepine                 778 Tricyclics and metabolites     300 Opiates and metabolites        300 Cocaine and metabolites        300 THC                            50 Performed at [facility], [place] [address] [place], [place] [address]    I-stat hCG, quantitative 04/22/2021 <5.0  <5 mIU/mL Final    Comment 3 04/22/2021          Final   Comment:   GEST. AGE      CONC.  (mIU/mL)   <=1 WEEK        5 - 50     2 WEEKS       50 - 500     3 WEEKS       100 - 10,000     4 WEEKS     1,000 - 30,000        FEMALE AND NON-PREGNANT FEMALE:     LESS THAN 5 mIU/mL    SARS Coronavirus 2 by RT PCR 04/22/2021 NEGATIVE  NEGATIVE Final   Comment: (NOTE) SARS-CoV-2 target nucleic acids are NOT DETECTED.  The SARS-CoV-2 RNA is generally detectable in upper respiratory specimens during the acute phase of infection. The lowest concentration of SARS-CoV-2 viral copies this assay can detect is 138 copies/mL. A negative result does not preclude SARS-Cov-2 infection and should not be used as the sole basis for treatment or other patient management decisions. A negative result may occur with  improper specimen collection/handling, submission of specimen other than nasopharyngeal swab, presence of viral mutation(s) within the areas targeted by this assay, and inadequate number of viral  copies(<138 copies/mL). A negative result must be combined with clinical observations, patient history, and epidemiological information. The expected result is Negative.  Fact Sheet for Patients:  EntrepreneurPulse.com.au  Fact Sheet for Healthcare Providers:  IncredibleEmployment.be  This test is no                          t yet approved or cleared by the [place] FDA and  has been authorized for detection and/or diagnosis of SARS-CoV-2 by FDA under an Emergency Use Authorization (EUA). This EUA will remain  in effect (meaning this test can be used) for the duration of the COVID-19 declaration under Section 564(b)(1) of the Act, 21 U.S.C.section 360bbb-3(b)(1), unless the authorization is terminated  or revoked sooner.       Influenza A by PCR 04/22/2021 NEGATIVE  NEGATIVE Final   Influenza B by PCR 04/22/2021 NEGATIVE  NEGATIVE Final   Comment: (NOTE) The Xpert Xpress  SARS-CoV-2/FLU/RSV plus assay is intended as an aid in the diagnosis of influenza from Nasopharyngeal swab specimens and should not be used as a sole basis for treatment. Nasal washings and aspirates are unacceptable for Xpert Xpress SARS-CoV-2/FLU/RSV testing.  Fact Sheet for Patients: EntrepreneurPulse.com.au  Fact Sheet for Healthcare Providers: IncredibleEmployment.be  This test is not yet approved or cleared by the [place] FDA and has been authorized for detection and/or diagnosis of SARS-CoV-2 by FDA under an Emergency Use Authorization (EUA). This EUA will remain in effect (meaning this test can be used) for the duration of the COVID-19 declaration under Section 564(b)(1) of the Act, 21 U.S.C. section 360bbb-3(b)(1), unless the authorization is terminated or revoked.  Performed at [facility], [place] [address] [place],  [address]     Blood Alcohol level:  Lab Results  Component Value Date   ETH 285 (H) 10/06/2021   ETH 276 (H) 37/62/8315    Metabolic Disorder Labs: Lab Results  Component Value Date   HGBA1C 5.2 01/20/2021   MPG 102.54 01/20/2021   MPG 85.32 04/21/2019   Lab Results  Component Value Date   PROLACTIN 28.7 (H) 05/04/2017   Lab Results  Component Value Date   CHOL 154 01/20/2021   TRIG 120 01/20/2021   HDL 47 01/20/2021   CHOLHDL 3.3 01/20/2021   VLDL 24 01/20/2021   LDLCALC 83 01/20/2021   LDLCALC 76 04/21/2019    Therapeutic Lab Levels: No results found for: LITHIUM No results found for: VALPROATE No components found for:  CBMZ  Physical Findings   AIMS    Flowsheet Row Admission (Discharged) from 04/19/2019 in [facility] Admission (Discharged) from 05/02/2017 in [facility]  AIMS Total Score 0 0      AUDIT    Flowsheet Row Admission (Discharged) from 04/19/2019 in [facility] [facility] Admission (Discharged) from 05/02/2017 in [facility] Admission (Discharged) from 12/22/2014 in [facility] [facility] Admission (Discharged) from 02/02/2014 in [facility] Admission (Discharged) from 09/27/2013 in [place] [facility]  Alcohol Use Disorder Identification Test Final Score (AUDIT) 34 34 29 38 34      GAD-7    Flowsheet Row Video Visit from 07/08/2021 in [facility] Video Visit from 05/13/2021 in [facility] Video Visit from 04/02/2021 in [facility] Video Visit from 02/17/2021 in [facility] Video Visit from 01/06/2021 in [facility]  Total GAD-7 Score 18 16 15 11 17       PHQ2-9    Flowsheet Row ED from 10/06/2021 in [facility] Video Visit from 07/08/2021 in [facility] Video Visit from 05/13/2021 in [facility] Video Visit from 04/02/2021 in [facility] Video Visit from 02/17/2021 in [facility]  PHQ-2 Total Score 4 2 2 3 4   PHQ-9 Total Score 15 9 10 15 15       Flowsheet Row ED from 10/07/2021 in [facility] ED from 10/06/2021 in [facility] DEPT Video Visit from 07/08/2021 in [place] CATEGORY Low Risk Low Risk No Risk        Musculoskeletal  Strength & Muscle Tone: within normal limits Gait & Station: normal Patient leans: N/A  Psychiatric Specialty Exam  Presentation  General Appearance: Appropriate for Environment; Disheveled  Eye Contact:Fleeting  Speech:Clear and Coherent; Normal Rate  Speech Volume:Normal  Handedness:Right   Mood and  Affect  Mood:Depressed  Affect:Congruent   Thought Process  Thought Processes:Coherent  Descriptions of Associations:Intact  Orientation:Full (Time, Place and Person)  Thought Content:Logical  Diagnosis of Schizophrenia or Schizoaffective disorder in past: No    Hallucinations:Hallucinations: None  Ideas of Reference:None  Suicidal Thoughts:Suicidal Thoughts: No  Homicidal Thoughts:Homicidal Thoughts: No   Sensorium  Memory:Immediate Good; Recent Good; Remote Good  Judgment:Good  Insight:Good   Executive Functions  Concentration:Good  Attention Span:Good  Golf of Knowledge:Good  Language:Good   Psychomotor Activity  Psychomotor Activity:Psychomotor Activity: Normal   Assets  Assets:Communication Skills; Desire for Improvement; Financial Resources/Insurance; Housing; Resilience; Social Support; Physical Health   Sleep  Sleep:Sleep: Good   No data recorded  Physical Exam  Physical Exam Vitals and nursing note reviewed.  Constitutional:      General: She is not in acute distress.    Appearance: Normal appearance. She is not ill-appearing.  HENT:     Head: Normocephalic.  Eyes:     General:        Right eye: No discharge.        Left eye: No discharge.     Pupils: Pupils are equal, round, and reactive to light.  Cardiovascular:     Rate and Rhythm: Normal rate.  Pulmonary:     Effort: Pulmonary effort is normal.  Musculoskeletal:        General: Normal range of motion.     Cervical back: Normal range of motion.  Skin:    Coloration: Skin is not jaundiced or pale.  Neurological:     Mental Status: She is alert and oriented to person, place, and time.  Psychiatric:        Attention and Perception: Attention and perception normal.        Mood and Affect: Mood is depressed.        Speech: Speech normal.        Behavior: Behavior normal. Behavior is cooperative.        Thought Content: Thought content normal.         Cognition and Memory: Cognition normal.        Judgment: Judgment is impulsive.   Review of Systems  Constitutional: Negative.   HENT: Negative.    Eyes: Negative.   Respiratory: Negative.    Cardiovascular: Negative.  Musculoskeletal: Negative.   Skin: Negative.   Neurological: Negative.   Psychiatric/Behavioral:  Positive for depression.   Blood pressure (!) 135/54, pulse 77, temperature 98.4 F (36.9 C), temperature source Tympanic, resp. rate 16, SpO2 99 %. There is no height or weight on file to calculate BMI.  Treatment Plan Summary: Daily contact with patient to assess and evaluate symptoms and progress in treatment and Medication management  Disposition: ongoing. Discharge plan set for 10/11/2021, patient has court date on this date as well. She has been provided residential treatment facilities resources for patient to contact after discharge. Patient refused referral/resources for [facility].   14 day supply of medications ordered for Lexapro 30 mg qd, Gabapentin 100 mg  TID,Seroquel 100 mg QHS, Topamax 50 mg qd and 100 mg QHS - printed prescription for 30 day supply plus one refill placed in patients chart.   Revonda Humphrey, NP 10/09/2021 3:40 PM

## 2021-10-09 NOTE — Progress Notes (Signed)
Patient laying in bed with eyes closed, respirations are even and unlabored at this time. No objective signs of discomfort. Nursing staff will continue to monitor.

## 2021-10-09 NOTE — ED Notes (Signed)
Patient in day room watching V - no complaints or sxs of distress noted - will continue to monitor for safety

## 2021-10-10 DIAGNOSIS — F1094 Alcohol use, unspecified with alcohol-induced mood disorder: Secondary | ICD-10-CM | POA: Diagnosis not present

## 2021-10-10 DIAGNOSIS — F4310 Post-traumatic stress disorder, unspecified: Secondary | ICD-10-CM | POA: Diagnosis not present

## 2021-10-10 DIAGNOSIS — Z87891 Personal history of nicotine dependence: Secondary | ICD-10-CM | POA: Diagnosis not present

## 2021-10-10 NOTE — Progress Notes (Signed)
During shift, patient denies SI/ HI and AVH. Patient is medication compliant. Pleasant and cooperative on unit with staff and peer. Patient has no complaints or concerns, understands plan is to discharge tomorrow. Patient has not received any PRN medication today. No objective signs of dangerous behaviors or self injurious behaviors. CIWA score of 0. RN will report to oncoming staff.

## 2021-10-10 NOTE — ED Notes (Signed)
Patient alert and oriented X 3, denies pain, SI, HI as well as Auditory or Visual hallucinations. Patient is calm and cooperative; Demonstrates appropriate behavior on unit towards peers and staff. Patient able to complete all ADL independently, and continues to be medication compliant. No objective behaviors observed of hallucinations. Last BP 92/62 without any symptoms, encouraged to drink fluids.Nursing staff will continue to monitor.

## 2021-10-10 NOTE — ED Notes (Signed)
Pt eating meal

## 2021-10-10 NOTE — Progress Notes (Signed)
Evening medication gabapentin given later due to patient sleeping at 4 pm. Patient now up and offered dinner. Denies pain or discomfort at this time.

## 2021-10-10 NOTE — ED Notes (Signed)
Pt eating dinner

## 2021-10-10 NOTE — ED Provider Notes (Signed)
Behavioral Health Progress Note  Date and Time: 10/10/2021 1:13 PM Name: Sara [name] MRN:  [id number]  Subjective: Sara [name], 45 y.o., female patient seen face to face by this provider, chart reviewed and discussed with Dr. Dwyane Dee 10/10/21. Per chart review she has a history of bipolar, anxiety, ptsd and alcohol use disorder. She was admitted to the [facility] for alcohol detox. She has a court appointment for Monday, 10/11/2021. She is currently on an Ativan taper and it will be complete on 10/11/2021. Disposition has been discussed with patient and discharge planning is set for 10/11/2021.  On evaluation Sara [name] is sitting in her bed.  She makes good eye contact. Patient calm/cooperative, alert/oriented x 4, with pleasant affect, and does not appear to be responding to internal/external stimuli.  She denies depression but endorses some anxiety at times.  She denies SI/HI/AVH.  Denies any withdrawal symptoms or health concerns.  She is tolerating medications without any adverse reactions.  She has attended and participated in groups.  Progress discussed.  Provided reassurance and encouragement.    Diagnosis:  Final diagnoses:  Alcohol use with alcohol-induced mood disorder (HCC)  Post traumatic stress disorder (PTSD)    Total Time spent with patient: 30 minutes  Past Psychiatric History: See H&P Past Medical History:  Past Medical History:  Diagnosis Date   Alcohol abuse    Alcoholism ([place])    Anemia    Anxiety    Blood transfusion without reported diagnosis    Cirrhosis ([place])    Depression    Esophageal varices with bleeding(456.0) 06/13/2014   GERD (gastroesophageal reflux disease)    Heart murmur    Patient states she may have   Menorrhagia    Pancytopenia ([place]) 01/15/2014   Pneumonia    Portal hypertension ([place])    S/P alcohol detoxification    2-3 days at behavioral health previously   UGI bleed 06/12/2014    Past Surgical History:   Procedure Laterality Date   CHOLECYSTECTOMY     ESOPHAGOGASTRODUODENOSCOPY N/A 06/12/2014   Procedure: ESOPHAGOGASTRODUODENOSCOPY (EGD);  Surgeon: Gatha Mayer, MD;  Location: [facility];  Service: Endoscopy;  Laterality: N/A;   ESOPHAGOGASTRODUODENOSCOPY (EGD) WITH PROPOFOL N/A 07/29/2014   Procedure: ESOPHAGOGASTRODUODENOSCOPY (EGD) WITH PROPOFOL;  Surgeon: Inda Castle, MD;  Location: [facility];  Service: Endoscopy;  Laterality: N/A;   ESOPHAGOGASTRODUODENOSCOPY (EGD) WITH PROPOFOL N/A 01/20/2018   Procedure: ESOPHAGOGASTRODUODENOSCOPY (EGD) WITH PROPOFOL;  Surgeon: Mauri Pole, MD;  Location: [facility];  Service: Endoscopy;  Laterality: N/A;   Family History:  Family History  Problem Relation Age of Onset   Colon polyps Mother    Hypertension Mother    Thyroid disease Mother    Alcoholism Mother    Alcoholism Father    Alcohol abuse Maternal Grandfather    Alcohol abuse Paternal Grandfather    Alcohol abuse Paternal Aunt    Alcohol abuse Maternal Uncle    Family Psychiatric  History: See H&P Social History:  Social History   Substance and Sexual Activity  Alcohol Use Yes   Alcohol/week: 0.0 standard drinks   Comment: Usually drinks 2-3 bottles of wine daily when drinking.      Social History   Substance and Sexual Activity  Drug Use No   Types: Cocaine, Marijuana   Comment: denies    Social History   Socioeconomic History   Marital status: Married    Spouse name: [name]   Number of children: 2   Years of education: Insurance account manager  Highest education level: Not on file  Occupational History   Occupation: Paralegal  Tobacco Use   Smoking status: Former    Packs/day: 0.25    Types: Cigarettes    Quit date: 11/22/2018    Years since quitting: 2.8   Smokeless tobacco: Never  Substance and Sexual Activity   Alcohol use: Yes    Alcohol/week: 0.0 standard drinks    Comment: Usually drinks 2-3 bottles of wine daily when drinking.    Drug use: No     Types: Cocaine, Marijuana    Comment: denies   Sexual activity: Never    Birth control/protection: None  Other Topics Concern   Not on file  Social History Narrative   Lives with husband and son. Daughter lives nearby. 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   Social Determinants of Health   Financial Resource Strain: Not on file  Food Insecurity: Not on file  Transportation Needs: Not on file  Physical Activity: Not on file  Stress: Not on file  Social Connections: Not on file   SDOH:  SDOH Screenings   Alcohol Screen: Not on file  Depression (PHQ2-9): Medium Risk   PHQ-2 Score: 15  Financial Resource Strain: Not on file  Food Insecurity: Not on file  Housing: Not on file  Physical Activity: Not on file  Social Connections: Not on file  Stress: Not on file  Tobacco Use: Medium Risk   Smoking Tobacco Use: Former   Smokeless Tobacco Use: Never   Passive Exposure: Not on file  Transportation Needs: Not on file   Additional Social History:       Sleep: Good  Appetite:  Good  Current Medications:  Current Facility-Administered Medications  Medication Dose Route Frequency Provider Last Rate Last Admin   alum & mag hydroxide-simeth (MAALOX/MYLANTA) 200-200-20 MG/5ML suspension 30 mL  30 mL Oral Q4H PRN Ival Bible, MD       escitalopram (LEXAPRO) tablet 30 mg  30 mg Oral Daily Suella Broad, FNP   30 mg at 10/10/21 1155   gabapentin (NEURONTIN) capsule 100 mg  100 mg Oral TID Ival Bible, MD   100 mg at 10/10/21 1155   hydrOXYzine (ATARAX/VISTARIL) tablet 25 mg  25 mg Oral TID PRN Ival Bible, MD       hydrOXYzine (ATARAX/VISTARIL) tablet 25 mg  25 mg Oral Q6H PRN Ival Bible, MD       loperamide (IMODIUM) capsule 2-4 mg  2-4 mg Oral PRN Ival Bible, MD       LORazepam (ATIVAN) tablet 1 mg  1 mg Oral Q6H PRN Ival Bible, MD       LORazepam (ATIVAN) tablet 1 mg  1 mg Oral BID Ival Bible, MD   1 mg at 10/10/21 1156   magnesium hydroxide (MILK OF MAGNESIA) suspension 30 mL  30 mL Oral Daily PRN Ival Bible, MD       multivitamin with minerals tablet 1 tablet  1 tablet Oral Daily Ival Bible, MD   1 tablet at 10/10/21 1156   ondansetron (ZOFRAN-ODT) disintegrating tablet 4 mg  4 mg Oral Q6H PRN Ival Bible, MD       QUEtiapine (SEROQUEL) tablet 100 mg  100 mg Oral QHS Suella Broad, FNP   100 mg at 10/09/21 2109   thiamine tablet 100 mg  100 mg Oral Daily Ival Bible, MD   100 mg at 10/10/21 1155  topiramate (TOPAMAX) tablet 100 mg  100 mg Oral QHS Suella Broad, FNP   100 mg at 10/09/21 2109   topiramate (TOPAMAX) tablet 50 mg  50 mg Oral Daily Ival Bible, MD   50 mg at 10/10/21 1155   traZODone (DESYREL) tablet 50 mg  50 mg Oral QHS PRN Ival Bible, MD       Current Outpatient Medications  Medication Sig Dispense Refill   escitalopram (LEXAPRO) 10 MG tablet Take 3 tablets (30 mg total) by mouth daily. 42 tablet 0   gabapentin (NEURONTIN) 100 MG capsule Take 1 capsule (100 mg total) by mouth 3 (three) times daily. 42 capsule 0   QUEtiapine (SEROQUEL) 100 MG tablet Take 1 tablet (100 mg total) by mouth at bedtime. 14 tablet 0   topiramate (TOPAMAX) 100 MG tablet Take 1 tablet (100 mg total) by mouth at bedtime. 14 tablet 0   topiramate (TOPAMAX) 50 MG tablet Take 1 tablet (50 mg total) by mouth daily. 14 tablet 0   clonazePAM (KLONOPIN) 0.5 MG tablet Take 1 tablet (0.5 mg total) by mouth 2 (two) times daily as needed for anxiety. 60 tablet 0   escitalopram (LEXAPRO) 10 MG tablet Take 3 tablets (30 mg total) by mouth daily. 90 tablet 1   gabapentin (NEURONTIN) 100 MG capsule Take 1 capsule (100 mg total) by mouth 3 (three) times daily. 90 capsule 1   QUEtiapine (SEROQUEL) 100 MG tablet Take 1 tablet (100 mg total) by mouth at bedtime. 30 tablet 1   topiramate (TOPAMAX) 100 MG tablet Take 1 tablet  (100 mg total) by mouth at bedtime. 30 tablet 1   topiramate (TOPAMAX) 50 MG tablet Take 1 tablet (50 mg total) by mouth daily. 30 tablet 1    Labs  Lab Results:  Admission on 10/07/2021  Component Date Value Ref Range Status   POC Amphetamine UR 10/07/2021 None Detected  NONE DETECTED (Cut Off Level 1000 ng/mL) Final   POC Secobarbital (BAR) 10/07/2021 None Detected  NONE DETECTED (Cut Off Level 300 ng/mL) Final   POC Buprenorphine (BUP) 10/07/2021 None Detected  NONE DETECTED (Cut Off Level 10 ng/mL) Final   POC Oxazepam (BZO) 10/07/2021 Positive (A)  NONE DETECTED (Cut Off Level 300 ng/mL) Final   POC Cocaine UR 10/07/2021 None Detected  NONE DETECTED (Cut Off Level 300 ng/mL) Final   POC Methamphetamine UR 10/07/2021 None Detected  NONE DETECTED (Cut Off Level 1000 ng/mL) Final   POC Morphine 10/07/2021 None Detected  NONE DETECTED (Cut Off Level 300 ng/mL) Final   POC Oxycodone UR 10/07/2021 None Detected  NONE DETECTED (Cut Off Level 100 ng/mL) Final   POC Methadone UR 10/07/2021 None Detected  NONE DETECTED (Cut Off Level 300 ng/mL) Final   POC Marijuana UR 10/07/2021 None Detected  NONE DETECTED (Cut Off Level 50 ng/mL) Final  Admission on 10/06/2021, Discharged on 10/07/2021  Component Date Value Ref Range Status   Lipase 10/06/2021 93 (H)  11 - 51 U/L Final   Performed at [facility], [place] [address], [place], [place] [address]   Sodium 10/06/2021 140  135 - 145 mmol/L Final   Potassium 10/06/2021 3.3 (L)  3.5 - 5.1 mmol/L Final   Chloride 10/06/2021 110  98 - 111 mmol/L Final   CO2 10/06/2021 20 (L)  22 - 32 mmol/L Final   Glucose, Bld 10/06/2021 119 (H)  70 - 99 mg/dL Final   Glucose reference range applies only to samples taken after  fasting for at least 8 hours.   BUN 10/06/2021 9  6 - 20 mg/dL Final   Creatinine, Ser 10/06/2021 0.52  0.44 - 1.00 mg/dL Final   Calcium 10/06/2021 8.4 (L)  8.9 - 10.3 mg/dL Final   Total Protein 10/06/2021 7.2  6.5 - 8.1  g/dL Final   Albumin 10/06/2021 3.1 (L)  3.5 - 5.0 g/dL Final   AST 10/06/2021 49 (H)  15 - 41 U/L Final   ALT 10/06/2021 24  0 - 44 U/L Final   Alkaline Phosphatase 10/06/2021 88  38 - 126 U/L Final   Total Bilirubin 10/06/2021 0.8  0.3 - 1.2 mg/dL Final   GFR, Estimated 10/06/2021 >60  >60 mL/min Final   Comment: (NOTE) Calculated using the CKD-EPI Creatinine Equation (2021)    Anion gap 10/06/2021 10  5 - 15 Final   Performed at [facility], [place] [address], [place], [place] [address]   WBC 10/06/2021 2.2 (L)  4.0 - 10.5 K/uL Final   RBC 10/06/2021 3.93  3.87 - 5.11 MIL/uL Final   Hemoglobin 10/06/2021 11.4 (L)  12.0 - 15.0 g/dL Final   HCT 10/06/2021 35.1 (L)  36.0 - 46.0 % Final   MCV 10/06/2021 89.3  80.0 - 100.0 fL Final   MCH 10/06/2021 29.0  26.0 - 34.0 pg Final   MCHC 10/06/2021 32.5  30.0 - 36.0 g/dL Final   RDW 10/06/2021 17.4 (H)  11.5 - 15.5 % Final   Platelets 10/06/2021 76 (L)  150 - 400 K/uL Final   Comment: SPECIMEN CHECKED FOR CLOTS Immature Platelet Fraction may be clinically indicated, consider ordering this additional test [id number] REPEATED TO VERIFY PLATELET COUNT CONFIRMED BY SMEAR    nRBC 10/06/2021 0.0  0.0 - 0.2 % Final   Performed at [facility], [place] [address], [place], [place] [address]   Color, Urine 10/07/2021 YELLOW  YELLOW Final   APPearance 10/07/2021 CLOUDY (A)  CLEAR Final   Specific Gravity, Urine 10/07/2021 1.011  1.005 - 1.030 Final   pH 10/07/2021 5.0  5.0 - 8.0 Final   Glucose, UA 10/07/2021 NEGATIVE  NEGATIVE mg/dL Final   Hgb urine dipstick 10/07/2021 SMALL (A)  NEGATIVE Final   Bilirubin Urine 10/07/2021 NEGATIVE  NEGATIVE Final   Ketones, ur 10/07/2021 5 (A)  NEGATIVE mg/dL Final   Protein, ur 10/07/2021 NEGATIVE  NEGATIVE mg/dL Final   Nitrite 10/07/2021 NEGATIVE  NEGATIVE Final   Leukocytes,Ua 10/07/2021 LARGE (A)  NEGATIVE Final   RBC / HPF 10/07/2021 6-10  0 - 5 RBC/hpf Final   WBC, UA  10/07/2021 11-20  0 - 5 WBC/hpf Final   Bacteria, UA 10/07/2021 MANY (A)  NONE SEEN Final   Squamous Epithelial / LPF 10/07/2021 11-20  0 - 5 Final   Mucus 10/07/2021 PRESENT   Final   Performed at [facility], [place] [address] [place], [place] [address]   I-stat hCG, quantitative 10/06/2021 <5.0  <5 mIU/mL Final   Comment 3 10/06/2021          Final   Comment:   GEST. AGE      CONC.  (mIU/mL)   <=1 WEEK        5 - 50     2 WEEKS       50 - 500     3 WEEKS       100 - 10,000     4 WEEKS     1,000 - 30,000  FEMALE AND NON-PREGNANT FEMALE:     LESS THAN 5 mIU/mL    Alcohol, Ethyl (B) 10/06/2021 285 (H)  <10 mg/dL Final   Comment: (NOTE) Lowest detectable limit for serum alcohol is 10 mg/dL.  For medical purposes only. Performed at [facility], [place] [address] [place], [place] [address]    SARS Coronavirus 2 by RT PCR 10/07/2021 NEGATIVE  NEGATIVE Final   Comment: (NOTE) SARS-CoV-2 target nucleic acids are NOT DETECTED.  The SARS-CoV-2 RNA is generally detectable in upper respiratory specimens during the acute phase of infection. The lowest concentration of SARS-CoV-2 viral copies this assay can detect is 138 copies/mL. A negative result does not preclude SARS-Cov-2 infection and should not be used as the sole basis for treatment or other patient management decisions. A negative result may occur with  improper specimen collection/handling, submission of specimen other than nasopharyngeal swab, presence of viral mutation(s) within the areas targeted by this assay, and inadequate number of viral copies(<138 copies/mL). A negative result must be combined with clinical observations, patient history, and epidemiological information. The expected result is Negative.  Fact Sheet for Patients:  EntrepreneurPulse.com.au  Fact Sheet for Healthcare Providers:  IncredibleEmployment.be  This test is no                           t yet approved or cleared by the [place] FDA and  has been authorized for detection and/or diagnosis of SARS-CoV-2 by FDA under an Emergency Use Authorization (EUA). This EUA will remain  in effect (meaning this test can be used) for the duration of the COVID-19 declaration under Section 564(b)(1) of the Act, 21 U.S.C.section 360bbb-3(b)(1), unless the authorization is terminated  or revoked sooner.       Influenza A by PCR 10/07/2021 NEGATIVE  NEGATIVE Final   Influenza B by PCR 10/07/2021 NEGATIVE  NEGATIVE Final   Comment: (NOTE) The Xpert Xpress SARS-CoV-2/FLU/RSV plus assay is intended as an aid in the diagnosis of influenza from Nasopharyngeal swab specimens and should not be used as a sole basis for treatment. Nasal washings and aspirates are unacceptable for Xpert Xpress SARS-CoV-2/FLU/RSV testing.  Fact Sheet for Patients: EntrepreneurPulse.com.au  Fact Sheet for Healthcare Providers: IncredibleEmployment.be  This test is not yet approved or cleared by the [place] FDA and has been authorized for detection and/or diagnosis of SARS-CoV-2 by FDA under an Emergency Use Authorization (EUA). This EUA will remain in effect (meaning this test can be used) for the duration of the COVID-19 declaration under Section 564(b)(1) of the Act, 21 U.S.C. section 360bbb-3(b)(1), unless the authorization is terminated or revoked.  Performed at [facility], [place] [address], [place], [place] [address]   Appointment on 06/10/2021  Component Date Value Ref Range Status   WBC Count 06/10/2021 2.2 (L)  4.0 - 10.5 K/uL Final   RBC 06/10/2021 3.97  3.87 - 5.11 MIL/uL Final   Hemoglobin 06/10/2021 11.9 (L)  12.0 - 15.0 g/dL Final   HCT 06/10/2021 35.6 (L)  36.0 - 46.0 % Final   MCV 06/10/2021 89.7  80.0 - 100.0 fL Final   MCH 06/10/2021 30.0  26.0 - 34.0 pg Final   MCHC 06/10/2021 33.4  30.0 - 36.0 g/dL Final    RDW 06/10/2021 18.1 (H)  11.5 - 15.5 % Final   Platelet Count 06/10/2021 50 (L)  150 - 400 K/uL Final   Comment: Immature Platelet Fraction may be clinically indicated, consider  ordering this additional test [id number]    nRBC 06/10/2021 0.0  0.0 - 0.2 % Final   Neutrophils Relative % 06/10/2021 43  % Final   Neutro Abs 06/10/2021 1.0 (L)  1.7 - 7.7 K/uL Final   Lymphocytes Relative 06/10/2021 42  % Final   Lymphs Abs 06/10/2021 0.9  0.7 - 4.0 K/uL Final   Monocytes Relative 06/10/2021 13  % Final   Monocytes Absolute 06/10/2021 0.3  0.1 - 1.0 K/uL Final   Eosinophils Relative 06/10/2021 1  % Final   Eosinophils Absolute 06/10/2021 0.0  0.0 - 0.5 K/uL Final   Basophils Relative 06/10/2021 1  % Final   Basophils Absolute 06/10/2021 0.0  0.0 - 0.1 K/uL Final   Immature Granulocytes 06/10/2021 0  % Final   Abs Immature Granulocytes 06/10/2021 0.00  0.00 - 0.07 K/uL Final   Performed at [facility], [place] [address] [place], [place] [address]   Ferritin 06/10/2021 10 (L)  11 - 307 ng/mL Final   Performed at [place] [address], [place], [place] [address]   Iron 06/10/2021 86  28 - 170 ug/dL Final   TIBC 06/10/2021 510 (H)  250 - 450 ug/dL Final   Saturation Ratios 06/10/2021 17  10.4 - 31.8 % Final   UIBC 06/10/2021 424  ug/dL Final   Performed at [place] [address] [place], [place] [address]  Admission on 05/31/2021, Discharged on 05/31/2021  Component Date Value Ref Range Status   Sodium 05/31/2021 143  135 - 145 mmol/L Final   Potassium 05/31/2021 3.6  3.5 - 5.1 mmol/L Final   Chloride 05/31/2021 114 (H)  98 - 111 mmol/L Final   CO2 05/31/2021 21 (L)  22 - 32 mmol/L Final   Glucose, Bld 05/31/2021 99  70 - 99 mg/dL Final   Glucose reference range applies only to samples taken after fasting for at least 8 hours.   BUN 05/31/2021 <5 (L)  6 - 20 mg/dL Final   Creatinine, Ser 05/31/2021 0.46  0.44 -  1.00 mg/dL Final   Calcium 05/31/2021 8.4 (L)  8.9 - 10.3 mg/dL Final   GFR, Estimated 05/31/2021 >60  >60 mL/min Final   Comment: (NOTE) Calculated using the CKD-EPI Creatinine Equation (2021)    Anion gap 05/31/2021 8  5 - 15 Final   Performed at [facility], [place] [address] [place], [place] [address]   WBC 05/31/2021 2.1 (L)  4.0 - 10.5 K/uL Final   RBC 05/31/2021 4.07  3.87 - 5.11 MIL/uL Final   Hemoglobin 05/31/2021 11.8 (L)  12.0 - 15.0 g/dL Final   HCT 05/31/2021 36.6  36.0 - 46.0 % Final   MCV 05/31/2021 89.9  80.0 - 100.0 fL Final   MCH 05/31/2021 29.0  26.0 - 34.0 pg Final   MCHC 05/31/2021 32.2  30.0 - 36.0 g/dL Final   RDW 05/31/2021 18.5 (H)  11.5 - 15.5 % Final   Platelets 05/31/2021 78 (L)  150 - 400 K/uL Final   Comment: Immature Platelet Fraction may be clinically indicated, consider ordering this additional test [id number]    nRBC 05/31/2021 0.0  0.0 - 0.2 % Final   Neutrophils Relative % 05/31/2021 37  % Final   Neutro Abs 05/31/2021 0.8 (L)  1.7 - 7.7 K/uL Final   Lymphocytes Relative 05/31/2021 55  % Final   Lymphs Abs 05/31/2021 1.2  0.7 - 4.0 K/uL Final   Monocytes Relative  05/31/2021 5  % Final   Monocytes Absolute 05/31/2021 0.1  0.1 - 1.0 K/uL Final   Eosinophils Relative 05/31/2021 1  % Final   Eosinophils Absolute 05/31/2021 0.0  0.0 - 0.5 K/uL Final   Basophils Relative 05/31/2021 1  % Final   Basophils Absolute 05/31/2021 0.0  0.0 - 0.1 K/uL Final   Immature Granulocytes 05/31/2021 1  % Final   Abs Immature Granulocytes 05/31/2021 0.01  0.00 - 0.07 K/uL Final   Polychromasia 05/31/2021 PRESENT   Final   Performed at [facility], [place] [address], [place], [place] [address]   SARS Coronavirus 2 by RT PCR 05/31/2021 NEGATIVE  NEGATIVE Final   Comment: (NOTE) SARS-CoV-2 target nucleic acids are NOT DETECTED.  The SARS-CoV-2 RNA is generally detectable in upper respiratory specimens during the acute phase of  infection. The lowest concentration of SARS-CoV-2 viral copies this assay can detect is 138 copies/mL. A negative result does not preclude SARS-Cov-2 infection and should not be used as the sole basis for treatment or other patient management decisions. A negative result may occur with  improper specimen collection/handling, submission of specimen other than nasopharyngeal swab, presence of viral mutation(s) within the areas targeted by this assay, and inadequate number of viral copies(<138 copies/mL). A negative result must be combined with clinical observations, patient history, and epidemiological information. The expected result is Negative.  Fact Sheet for Patients:  EntrepreneurPulse.com.au  Fact Sheet for Healthcare Providers:  IncredibleEmployment.be  This test is no                          t yet approved or cleared by the [place] FDA and  has been authorized for detection and/or diagnosis of SARS-CoV-2 by FDA under an Emergency Use Authorization (EUA). This EUA will remain  in effect (meaning this test can be used) for the duration of the COVID-19 declaration under Section 564(b)(1) of the Act, 21 U.S.C.section 360bbb-3(b)(1), unless the authorization is terminated  or revoked sooner.       Influenza A by PCR 05/31/2021 NEGATIVE  NEGATIVE Final   Influenza B by PCR 05/31/2021 NEGATIVE  NEGATIVE Final   Comment: (NOTE) The Xpert Xpress SARS-CoV-2/FLU/RSV plus assay is intended as an aid in the diagnosis of influenza from Nasopharyngeal swab specimens and should not be used as a sole basis for treatment. Nasal washings and aspirates are unacceptable for Xpert Xpress SARS-CoV-2/FLU/RSV testing.  Fact Sheet for Patients: EntrepreneurPulse.com.au  Fact Sheet for Healthcare Providers: IncredibleEmployment.be  This test is not yet approved or cleared by the [place] FDA and has been  authorized for detection and/or diagnosis of SARS-CoV-2 by FDA under an Emergency Use Authorization (EUA). This EUA will remain in effect (meaning this test can be used) for the duration of the COVID-19 declaration under Section 564(b)(1) of the Act, 21 U.S.C. section 360bbb-3(b)(1), unless the authorization is terminated or revoked.  Performed at [facility], [place] [address] [place], [place] [address]    Troponin I (High Sensitivity) 05/31/2021 <2  <18 ng/L Final   Comment: (NOTE) Elevated high sensitivity troponin I (hsTnI) values and significant  changes across serial measurements may suggest ACS but many other  chronic and acute conditions are known to elevate hsTnI results.  Refer to the Links section for chest pain algorithms and additional  guidance. Performed at [facility], [place] [address] [place], [place] [address]    Path Review 05/31/2021 Reviewed  By Violet Baldy, M.D.   Final   Comment: 06/01/21 Pancytopenia. Performed at [facility], [place] [address] [place], [place] [address]   Admission on 04/26/2021, Discharged on 04/26/2021  Component Date Value Ref Range Status   Preg Test, Ur 04/26/2021 NEGATIVE  NEGATIVE Final   Comment:        THE SENSITIVITY OF THIS METHODOLOGY IS >24 mIU/mL    Preg Test, Ur 04/26/2021 NEGATIVE  NEGATIVE Final   Comment:        THE SENSITIVITY OF THIS METHODOLOGY IS >24 mIU/mL    Preg Test, Ur 04/26/2021 NEGATIVE  NEGATIVE Final   Comment:        THE SENSITIVITY OF THIS METHODOLOGY IS >24 mIU/mL   Admission on 04/22/2021, Discharged on 04/22/2021  Component Date Value Ref Range Status   Sodium 04/22/2021 144  135 - 145 mmol/L Final   Potassium 04/22/2021 3.6  3.5 - 5.1 mmol/L Final   Chloride 04/22/2021 115 (H)  98 - 111 mmol/L Final   CO2 04/22/2021 20 (L)  22 - 32 mmol/L Final   Glucose, Bld 04/22/2021 104 (H)  70 - 99 mg/dL Final   Glucose reference range applies  only to samples taken after fasting for at least 8 hours.   BUN 04/22/2021 9  6 - 20 mg/dL Final   Creatinine, Ser 04/22/2021 0.86  0.44 - 1.00 mg/dL Final   Calcium 04/22/2021 9.5  8.9 - 10.3 mg/dL Final   Total Protein 04/22/2021 7.4  6.5 - 8.1 g/dL Final   Albumin 04/22/2021 3.7  3.5 - 5.0 g/dL Final   AST 04/22/2021 31  15 - 41 U/L Final   ALT 04/22/2021 21  0 - 44 U/L Final   Alkaline Phosphatase 04/22/2021 95  38 - 126 U/L Final   Total Bilirubin 04/22/2021 0.6  0.3 - 1.2 mg/dL Final   GFR, Estimated 04/22/2021 >60  >60 mL/min Final   Comment: (NOTE) Calculated using the CKD-EPI Creatinine Equation (2021)    Anion gap 04/22/2021 9  5 - 15 Final   Performed at [facility], [place] [address] [place], [place] [address]   Alcohol, Ethyl (B) 04/22/2021 276 (H)  <10 mg/dL Final   Comment: (NOTE) Lowest detectable limit for serum alcohol is 10 mg/dL.  For medical purposes only. Performed at [facility], [place] [address], [place], [place] [address]    Salicylate Lvl 90/24/0973 <7.0 (L)  7.0 - 30.0 mg/dL Final   Performed at [place] [address], [place], [place] [address]   Acetaminophen (Tylenol), Serum 04/22/2021 <10 (L)  10 - 30 ug/mL Final   Comment: (NOTE) Therapeutic concentrations vary significantly. A range of 10-30 ug/mL  may be an effective concentration for many patients. However, some  are best treated at concentrations outside of this range. Acetaminophen concentrations >150 ug/mL at 4 hours after ingestion  and >50 ug/mL at 12 hours after ingestion are often associated with  toxic reactions.  Performed at [facility], [place] [address], [place], [place] [address]    WBC 04/22/2021 3.7 (L)  4.0 - 10.5 K/uL Final   RBC 04/22/2021 4.18  3.87 - 5.11 MIL/uL Final   Hemoglobin 04/22/2021 11.6 (L)  12.0 - 15.0 g/dL Final   HCT 04/22/2021 37.0  36.0 - 46.0 % Final   MCV 04/22/2021 88.5  80.0  - 100.0 fL Final   MCH 04/22/2021 27.8  26.0 - 34.0 pg Final  MCHC 04/22/2021 31.4  30.0 - 36.0 g/dL Final   RDW 04/22/2021 17.5 (H)  11.5 - 15.5 % Final   Platelets 04/22/2021 96 (L)  150 - 400 K/uL Final   Comment: SPECIMEN CHECKED FOR CLOTS Immature Platelet Fraction may be clinically indicated, consider ordering this additional test [id number] REPEATED TO VERIFY PLATELET COUNT CONFIRMED BY SMEAR    nRBC 04/22/2021 0.0  0.0 - 0.2 % Final   Performed at [facility], [place] [address], [place], [place] [address]   Opiates 04/22/2021 NONE DETECTED  NONE DETECTED Final   Cocaine 04/22/2021 NONE DETECTED  NONE DETECTED Final   Benzodiazepines 04/22/2021 NONE DETECTED  NONE DETECTED Final   Amphetamines 04/22/2021 NONE DETECTED  NONE DETECTED Final   Tetrahydrocannabinol 04/22/2021 NONE DETECTED  NONE DETECTED Final   Barbiturates 04/22/2021 NONE DETECTED  NONE DETECTED Final   Comment: (NOTE) DRUG SCREEN FOR MEDICAL PURPOSES ONLY.  IF CONFIRMATION IS NEEDED FOR ANY PURPOSE, NOTIFY LAB WITHIN 5 DAYS.  LOWEST DETECTABLE LIMITS FOR URINE DRUG SCREEN Drug Class                     Cutoff (ng/mL) Amphetamine and metabolites    1000 Barbiturate and metabolites    200 Benzodiazepine                 706 Tricyclics and metabolites     300 Opiates and metabolites        300 Cocaine and metabolites        300 THC                            50 Performed at [facility], [place] [address], [place], [place] [address]    I-stat hCG, quantitative 04/22/2021 <5.0  <5 mIU/mL Final   Comment 3 04/22/2021          Final   Comment:   GEST. AGE      CONC.  (mIU/mL)   <=1 WEEK        5 - 50     2 WEEKS       50 - 500     3 WEEKS       100 - 10,000     4 WEEKS     1,000 - 30,000        FEMALE AND NON-PREGNANT FEMALE:     LESS THAN 5 mIU/mL    SARS Coronavirus 2 by RT PCR 04/22/2021 NEGATIVE  NEGATIVE Final   Comment: (NOTE) SARS-CoV-2 target nucleic acids are  NOT DETECTED.  The SARS-CoV-2 RNA is generally detectable in upper respiratory specimens during the acute phase of infection. The lowest concentration of SARS-CoV-2 viral copies this assay can detect is 138 copies/mL. A negative result does not preclude SARS-Cov-2 infection and should not be used as the sole basis for treatment or other patient management decisions. A negative result may occur with  improper specimen collection/handling, submission of specimen other than nasopharyngeal swab, presence of viral mutation(s) within the areas targeted by this assay, and inadequate number of viral copies(<138 copies/mL). A negative result must be combined with clinical observations, patient history, and epidemiological information. The expected result is Negative.  Fact Sheet for Patients:  EntrepreneurPulse.com.au  Fact Sheet for Healthcare Providers:  IncredibleEmployment.be  This test is no  t yet approved or cleared by the [place] and  has been authorized for detection and/or diagnosis of SARS-CoV-2 by FDA under an Emergency Use Authorization (EUA). This EUA will remain  in effect (meaning this test can be used) for the duration of the COVID-19 declaration under Section 564(b)(1) of the Act, 21 U.S.C.section 360bbb-3(b)(1), unless the authorization is terminated  or revoked sooner.       Influenza A by PCR 04/22/2021 NEGATIVE  NEGATIVE Final   Influenza B by PCR 04/22/2021 NEGATIVE  NEGATIVE Final   Comment: (NOTE) The Xpert Xpress SARS-CoV-2/FLU/RSV plus assay is intended as an aid in the diagnosis of influenza from Nasopharyngeal swab specimens and should not be used as a sole basis for treatment. Nasal washings and aspirates are unacceptable for Xpert Xpress SARS-CoV-2/FLU/RSV testing.  Fact Sheet for Patients: EntrepreneurPulse.com.au  Fact Sheet for Healthcare  Providers: IncredibleEmployment.be  This test is not yet approved or cleared by the [place] FDA and has been authorized for detection and/or diagnosis of SARS-CoV-2 by FDA under an Emergency Use Authorization (EUA). This EUA will remain in effect (meaning this test can be used) for the duration of the COVID-19 declaration under Section 564(b)(1) of the Act, 21 U.S.C. section 360bbb-3(b)(1), unless the authorization is terminated or revoked.  Performed at [facility], [place] [address], [place], [place] [address]     Blood Alcohol level:  Lab Results  Component Value Date   ETH 285 (H) 10/06/2021   ETH 276 (H) 73/53/2992    Metabolic Disorder Labs: Lab Results  Component Value Date   HGBA1C 5.2 01/20/2021   MPG 102.54 01/20/2021   MPG 85.32 04/21/2019   Lab Results  Component Value Date   PROLACTIN 28.7 (H) 05/04/2017   Lab Results  Component Value Date   CHOL 154 01/20/2021   TRIG 120 01/20/2021   HDL 47 01/20/2021   CHOLHDL 3.3 01/20/2021   VLDL 24 01/20/2021   LDLCALC 83 01/20/2021   LDLCALC 76 04/21/2019    Therapeutic Lab Levels: No results found for: LITHIUM No results found for: VALPROATE No components found for:  CBMZ  Physical Findings   AIMS    Flowsheet Row Admission (Discharged) from 04/19/2019 in [facility] Admission (Discharged) from 05/02/2017 in [facility]  AIMS Total Score 0 0      AUDIT    Flowsheet Row Admission (Discharged) from 04/19/2019 in [facility] Admission (Discharged) from 05/02/2017 in [facility] Admission (Discharged) from 12/22/2014 in [facility] Admission (Discharged) from 02/02/2014 in [facility] Admission (Discharged) from 09/27/2013 in [facility]   Alcohol Use Disorder Identification Test Final Score (AUDIT) 34 34 29 38 34      GAD-7    Flowsheet Row Video Visit from 07/08/2021 in [facility] Video Visit from 05/13/2021 in [facility] Video Visit from 04/02/2021 in [facility] Video Visit from 02/17/2021 in [facility] Video Visit from 01/06/2021 in [facility]  Total GAD-7 Score 18 16 15 11 17       PHQ2-9    [place] [facility] from 10/06/2021 in [facility] Video Visit from 07/08/2021 in [facility] Video Visit from 05/13/2021 in [facility] Video Visit from  04/02/2021 in [facility] Video Visit from 02/17/2021 in   PHQ-2 Total Score 4 2 2 3 4   PHQ-9 Total Score 15 9 10 15 15       Flowsheet Row ED from 10/07/2021 in [facility] ED from 10/06/2021 in [place] [facility] Video Visit from 07/08/2021 in [facility] CATEGORY Low Risk Low Risk No Risk        Musculoskeletal  Strength & Muscle Tone: within normal limits Gait & Station: normal Patient leans: N/A  Psychiatric Specialty Exam  Presentation  General Appearance: Appropriate for Environment  Eye Contact:Good  Speech:Clear and Coherent; Normal Rate  Speech Volume:Normal  Handedness:Right   Mood and Affect  Mood:Anxious  Affect:Congruent   Thought Process  Thought Processes:Coherent  Descriptions of Associations:Intact  Orientation:Full (Time, Place and Person)  Thought Content:Logical  Diagnosis of Schizophrenia or Schizoaffective disorder in past: No    Hallucinations:Hallucinations: None  Ideas of Reference:None  Suicidal Thoughts:Suicidal Thoughts:  No  Homicidal Thoughts:Homicidal Thoughts: No   Sensorium  Memory:Immediate Good; Recent Good; Remote Good  Judgment:Good  Insight:Good   Executive Functions  Concentration:Good  Attention Span:Good  [place] of Knowledge:Good  Language:Good   Psychomotor Activity  Psychomotor Activity:Psychomotor Activity: Normal   Assets  Assets:Communication Skills; Desire for Improvement; Financial Resources/Insurance; Housing; Leisure Time; Physical Health; Resilience; Social Support   Sleep  Sleep:Sleep: Good   No data recorded  Physical Exam  Physical Exam Vitals and nursing note reviewed.  Constitutional:      General: She is not in acute distress.    Appearance: Normal appearance. She is not ill-appearing.  HENT:     Head: Normocephalic.  Eyes:     General:        Right eye: No discharge.        Left eye: No discharge.     Conjunctiva/sclera: Conjunctivae normal.  Cardiovascular:     Rate and Rhythm: Normal rate.  Pulmonary:     Effort: Pulmonary effort is normal.  Musculoskeletal:        General: Normal range of motion.     Cervical back: Normal range of motion.  Skin:    Coloration: Skin is not jaundiced or pale.  Neurological:     Mental Status: She is alert and oriented to person, place, and time.  Psychiatric:        Attention and Perception: Attention and perception normal.        Mood and Affect: Mood and affect normal.        Speech: Speech normal.        Behavior: Behavior normal. Behavior is cooperative.        Thought Content: Thought content normal.        Cognition and Memory: Cognition normal.        Judgment: Judgment is impulsive.   Review of Systems  Constitutional: Negative.   HENT: Negative.    Eyes: Negative.   Respiratory: Negative.    Cardiovascular: Negative.   Musculoskeletal: Negative.   Skin: Negative.   Neurological: Negative.   Psychiatric/Behavioral:  The patient is nervous/anxious.   Blood pressure 92/62,  pulse 82, temperature 97.8 F (36.6 C), temperature source Temporal, resp. rate 18, SpO2 91 %. There is no height or weight on file to calculate BMI.  Treatment Plan Summary: Daily contact with patient to assess and evaluate symptoms and progress in treatment and Medication management  Disposition: ongoing. Discharge plan set for 10/11/2021, patient has court date on this date as well. She has been provided residential treatment facilities resources for patient to contact after discharge. Patient refused referral/resources for [facility].    14 day supply of medications ordered for Lexapro 30 mg qd, Gabapentin 100 mg  TID,Seroquel 100 mg QHS, Topamax 50 mg qd and 100 mg QHS - printed prescription for 30 day supply plus one refill placed in patients chart.     Revonda Humphrey, NP 10/10/2021 1:13 PM

## 2021-10-10 NOTE — ED Notes (Signed)
Patient resting - no sxs of distress - will continue to monitor

## 2021-10-10 NOTE — Progress Notes (Signed)
Patient laying in bed with eyes closed, respirations are even and unlabored, no objective signs of distress. Nursing staff will continue to monitor.

## 2021-10-10 NOTE — ED Notes (Signed)
Pt sitting in dining room watching TV. Pt A&O x4, calm and cooperative. Pt denies current SI/HI/AVH. Pt denies any current needs. No signs of acute distress noted. Will continue to monitor for safety.

## 2021-10-10 NOTE — ED Notes (Signed)
Pt asleep in bed. Respirations even and unlabored. Will continue to monitor for safety. ?

## 2021-10-11 ENCOUNTER — Other Ambulatory Visit: Payer: Self-pay

## 2021-10-11 DIAGNOSIS — F1094 Alcohol use, unspecified with alcohol-induced mood disorder: Secondary | ICD-10-CM | POA: Diagnosis not present

## 2021-10-11 DIAGNOSIS — Z87891 Personal history of nicotine dependence: Secondary | ICD-10-CM | POA: Diagnosis not present

## 2021-10-11 DIAGNOSIS — F4310 Post-traumatic stress disorder, unspecified: Secondary | ICD-10-CM | POA: Diagnosis not present

## 2021-10-11 MED ORDER — LORAZEPAM 1 MG PO TABS
ORAL_TABLET | ORAL | Status: AC
  Filled 2021-10-11: qty 1

## 2021-10-11 MED ORDER — LORAZEPAM 1 MG PO TABS
1.0000 mg | ORAL_TABLET | Freq: Once | ORAL | Status: AC
  Administered 2021-10-11: 1 mg via ORAL

## 2021-10-11 NOTE — ED Notes (Signed)
Pt asleep in bed. Respirations even and unlabored. Will continue to monitor for safety. ?

## 2021-10-11 NOTE — Progress Notes (Signed)
Patient eating breakfast. Stated she has called her husband for a ride home.  Denied SI, HI, AVH.  Stated she feels safe and ready for discharge.

## 2021-10-11 NOTE — Progress Notes (Signed)
AVS, RX and samples reviewed with patient and all questions answered.  Patient verbalized understanding of information presented.  AM medications, including completing Ativan taper, given per Dr. Serafina Mitchell.  Patient's husband here to transport to court and then home.  Patient ambulated to lobby without issue.  Discharged in stable condition; no acute distress noted.

## 2021-10-11 NOTE — Discharge Instructions (Signed)

## 2021-10-11 NOTE — ED Provider Notes (Signed)
FBC/OBS ASAP Discharge Summary  Date and Time: 10/11/2021 8:32 AM  Name: Sara [name]  MRN:  [id number]   Discharge Diagnoses:  Final diagnoses:  Alcohol use with alcohol-induced mood disorder [facility])  Post traumatic stress disorder (PTSD)    Subjective:  Patient seen and chart reviewed.  She has been medication compliant with scheduled medications, has been calm cooperative and pleasant with staff and peers on the unit.  Patient interviewed this morning in the dining area.  She states her mood is "a lot better".  She denies SI/HI/AVH.  In discussing outpatient follow-up she indicates that she would like some resources which she plans to follow up with after she attends her court date this morning.  She denies alcohol withdrawal symptoms of tremors, nausea, vomiting, GI upset, sweating.  She denies shortness of breath, chest pain or dizziness.  She reports overall doing well today and states that she feels that she is ready for discharge.  Discussed with patient that she currently has Ativan ordered to be given at 10 AM for completion of Ativan taper but that it will be given early so that she is able to be picked up by her husband today and to attend her court date.  Patient verbalized understanding with the plan.    Stay Summary:  Sara [name] is a 45 yo female w PMH significant for bipolar disorder, anxiety, PTSD, and alcohol use disorder who was evaluated today at the [facility] following transfer from the [facility] ED on 11/17. She has been drinking 6 beers and hand sanitizer daily for the past week, but reports a 2 week period of sobriety prior to that.  Patient was started on Ativan taper and placed on withdrawal precautions with CIWA.  Throughout stay patient was medication compliant with scheduled medications, was appropriate with staff and peers on the unit.  Ativan taper was as follows ; 1mg  QID on 11/17, 1mg  TID on 11/19, 1mg  BID on 11/20, and 1mg  completed on 11/21.  As per above  patient received last Ativan level bit early so that she was able to be picked up by her husband in time to make her court date at 62 AM.  By day of discharge, patient's alcohol withdrawal symptoms had resolved and she denied SI/HI/AVH.  She had improved to the point of no longer needing hospitalization and was discharged to the care of her husband with samples, medication scripts, into resources for substance use treatment.   On my interview day of discharge, patient is in NAD, alert, oriented, calm, cooperative, and attentive, with normal affect, speech, and behavior. Objectively, there is no evidence of psychosis/ mania (able to converse coherently, linear and goal directed thought, no RIS, no distractibility, not pre-occupied, no FOI, etc) nor depression to the point of suicidality (able to concentrate, affect full and reactive, speech normal r/v/t, no psychomotor retardation/agitation, etc).  Overall, patient appears to be at the point, in the absence of inhibiting or disinhibiting symptoms, where she can successfully move to lesser restrictive setting for care.    Total Time spent with patient: 15 minutes  Past Psychiatric History: AUD, mdd, bipolar disorder, SIMD Past Medical History:  Past Medical History:  Diagnosis Date   Alcohol abuse    Alcoholism ([employer])    Anemia    Anxiety    Blood transfusion without reported diagnosis    Cirrhosis ([place])    Depression    Esophageal varices with bleeding(456.0) 06/13/2014   GERD (gastroesophageal reflux disease)  Heart murmur    Patient states she may have   Menorrhagia    Pancytopenia ([place]) 01/15/2014   Pneumonia    Portal hypertension ([place])    S/P alcohol detoxification    2-3 days at behavioral health previously   UGI bleed 06/12/2014    Past Surgical History:  Procedure Laterality Date   CHOLECYSTECTOMY     ESOPHAGOGASTRODUODENOSCOPY N/A 06/12/2014   Procedure: ESOPHAGOGASTRODUODENOSCOPY (EGD);  Surgeon: Gatha Mayer, MD;   Location: [facility];  Service: Endoscopy;  Laterality: N/A;   ESOPHAGOGASTRODUODENOSCOPY (EGD) WITH PROPOFOL N/A 07/29/2014   Procedure: ESOPHAGOGASTRODUODENOSCOPY (EGD) WITH PROPOFOL;  Surgeon: Inda Castle, MD;  Location: [facility];  Service: Endoscopy;  Laterality: N/A;   ESOPHAGOGASTRODUODENOSCOPY (EGD) WITH PROPOFOL N/A 01/20/2018   Procedure: ESOPHAGOGASTRODUODENOSCOPY (EGD) WITH PROPOFOL;  Surgeon: Mauri Pole, MD;  Location: [facility] ENDOSCOPY;  Service: Endoscopy;  Laterality: N/A;   Family History:  Family History  Problem Relation Age of Onset   Colon polyps Mother    Hypertension Mother    Thyroid disease Mother    Alcoholism Mother    Alcoholism Father    Alcohol abuse Maternal Grandfather    Alcohol abuse Paternal Grandfather    Alcohol abuse Paternal Aunt    Alcohol abuse Maternal Uncle    Family Psychiatric History: patient denied but per chart, mother, father, MGF and PGF, aunt and uncle with alcohol abuse Social History:  Social History   Substance and Sexual Activity  Alcohol Use Yes   Alcohol/week: 0.0 standard drinks   Comment: Usually drinks 2-3 bottles of wine daily when drinking.      Social History   Substance and Sexual Activity  Drug Use No   Types: Cocaine, Marijuana   Comment: denies    Social History   Socioeconomic History   Marital status: Married    Spouse name: [name]   Number of children: 2   Years of education: Associates   Highest education level: Not on file  Occupational History   Occupation: Paralegal  Tobacco Use   Smoking status: Former    Packs/day: 0.25    Types: Cigarettes    Quit date: 11/22/2018    Years since quitting: 2.8   Smokeless tobacco: Never  Substance and Sexual Activity   Alcohol use: Yes    Alcohol/week: 0.0 standard drinks    Comment: Usually drinks 2-3 bottles of wine daily when drinking.    Drug use: No    Types: Cocaine, Marijuana    Comment: denies   Sexual activity: Never    Birth  control/protection: None  Other Topics Concern   Not on file  Social History Narrative   Lives with husband and son. Daughter lives nearby. 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   Social Determinants of Health   Financial Resource Strain: Not on file  Food Insecurity: Not on file  Transportation Needs: Not on file  Physical Activity: Not on file  Stress: Not on file  Social Connections: Not on file   SDOH:  SDOH Screenings   Alcohol Screen: Not on file  Depression (PHQ2-9): Medium Risk   PHQ-2 Score: 15  Financial Resource Strain: Not on file  Food Insecurity: Not on file  Housing: Not on file  Physical Activity: Not on file  Social Connections: Not on file  Stress: Not on file  Tobacco Use: Medium Risk   Smoking Tobacco Use: Former   Smokeless Tobacco Use: Never   Passive Exposure: Not on file  Transportation Needs: Not on file    Tobacco Cessation:  N/A, patient does not currently use tobacco products  Current Medications:  Current Facility-Administered Medications  Medication Dose Route Frequency Provider Last Rate Last Admin   alum & mag hydroxide-simeth (MAALOX/MYLANTA) 200-200-20 MG/5ML suspension 30 mL  30 mL Oral Q4H PRN Ival Bible, MD       escitalopram (LEXAPRO) tablet 30 mg  30 mg Oral Daily Suella Broad, FNP   30 mg at 10/10/21 1155   gabapentin (NEURONTIN) capsule 100 mg  100 mg Oral TID Ival Bible, MD   100 mg at 10/10/21 2110   hydrOXYzine (ATARAX/VISTARIL) tablet 25 mg  25 mg Oral TID PRN Ival Bible, MD       magnesium hydroxide (MILK OF MAGNESIA) suspension 30 mL  30 mL Oral Daily PRN Ival Bible, MD       multivitamin with minerals tablet 1 tablet  1 tablet Oral Daily Ival Bible, MD   1 tablet at 10/10/21 1156   QUEtiapine (SEROQUEL) tablet 100 mg  100 mg Oral QHS Suella Broad, FNP   100 mg at 10/10/21 2110   thiamine tablet 100 mg  100 mg Oral Daily Ival Bible, MD   100 mg at 10/10/21 1155   topiramate (TOPAMAX) tablet 100 mg  100 mg Oral QHS Suella Broad, FNP   100 mg at 10/10/21 2110   topiramate (TOPAMAX) tablet 50 mg  50 mg Oral Daily Ival Bible, MD   50 mg at 10/10/21 1155   traZODone (DESYREL) tablet 50 mg  50 mg Oral QHS PRN Ival Bible, MD       Current Outpatient Medications  Medication Sig Dispense Refill   escitalopram (LEXAPRO) 10 MG tablet Take 3 tablets (30 mg total) by mouth daily. 42 tablet 0   gabapentin (NEURONTIN) 100 MG capsule Take 1 capsule (100 mg total) by mouth 3 (three) times daily. 42 capsule 0   QUEtiapine (SEROQUEL) 100 MG tablet Take 1 tablet (100 mg total) by mouth at bedtime. 14 tablet 0   topiramate (TOPAMAX) 100 MG tablet Take 1 tablet (100 mg total) by mouth at bedtime. 14 tablet 0   topiramate (TOPAMAX) 50 MG tablet Take 1 tablet (50 mg total) by mouth daily. 14 tablet 0   clonazePAM (KLONOPIN) 0.5 MG tablet Take 1 tablet (0.5 mg total) by mouth 2 (two) times daily as needed for anxiety. 60 tablet 0   escitalopram (LEXAPRO) 10 MG tablet Take 3 tablets (30 mg total) by mouth daily. 90 tablet 1   gabapentin (NEURONTIN) 100 MG capsule Take 1 capsule (100 mg total) by mouth 3 (three) times daily. 90 capsule 1   QUEtiapine (SEROQUEL) 100 MG tablet Take 1 tablet (100 mg total) by mouth at bedtime. 30 tablet 1   topiramate (TOPAMAX) 100 MG tablet Take 1 tablet (100 mg total) by mouth at bedtime. 30 tablet 1   topiramate (TOPAMAX) 50 MG tablet Take 1 tablet (50 mg total) by mouth daily. 30 tablet 1    PTA Medications: (Not in a hospital admission)   Musculoskeletal  Strength & Muscle Tone: within normal limits Gait & Station: normal Patient leans: N/A  Psychiatric Specialty Exam  Presentation  General Appearance: Appropriate for Environment  Eye Contact:Good  Speech:Clear and Coherent; Normal Rate  Speech Volume:Normal  Handedness:Right   Mood and Affect   Mood:Anxious  Affect:Congruent   Thought Process  Thought Processes:Coherent  Descriptions of  Associations:Intact  Orientation:Full (Time, Place and Person)  Thought Content:Logical  Diagnosis of Schizophrenia or Schizoaffective disorder in past: No    Hallucinations:Hallucinations: None  Ideas of Reference:None  Suicidal Thoughts:Suicidal Thoughts: No  Homicidal Thoughts:Homicidal Thoughts: No   Sensorium  Memory:Immediate Good; Recent Good; Remote Good  Judgment:Good  Insight:Good   Executive Functions  Concentration:Good  Attention Span:Good  [place] of Knowledge:Good  Language:Good   Psychomotor Activity  Psychomotor Activity:Psychomotor Activity: Normal   Assets  Assets:Communication Skills; Desire for Improvement; Financial Resources/Insurance; Housing; Leisure Time; Physical Health; Resilience; Social Support   Sleep  Sleep:Sleep: Good   No data recorded  Physical Exam  Physical Exam Constitutional:      Appearance: Normal appearance. She is normal weight.  HENT:     Head: Normocephalic and atraumatic.  Pulmonary:     Effort: Pulmonary effort is normal.  Neurological:     Mental Status: She is alert and oriented to person, place, and time.   Review of Systems  Constitutional:  Negative for chills and fever.  HENT:  Negative for hearing loss.   Eyes:  Negative for discharge and redness.  Respiratory:  Negative for cough and shortness of breath.   Cardiovascular:  Negative for chest pain.  Gastrointestinal:  Negative for abdominal pain, constipation, diarrhea, nausea and vomiting.  Musculoskeletal:  Negative for myalgias.  Neurological:  Negative for dizziness and headaches.  Psychiatric/Behavioral:  Positive for substance abuse. Negative for depression, hallucinations and suicidal ideas.   Blood pressure 90/60, pulse 80, temperature 97.8 F (36.6 C), temperature source Temporal, resp. rate 18, SpO2 93 %. There is no  height or weight on file to calculate BMI.  Demographic Factors:  Low socioeconomic status and Unemployed  Loss Factors: Legal issues  Historical Factors: Family history of mental illness or substance abuse and Impulsivity  Risk Reduction Factors:   Sense of responsibility to family, Religious beliefs about death, and Living with another person, especially a relative  Continued Clinical Symptoms:  Alcohol/Substance Abuse/Dependencies More than one psychiatric diagnosis Previous Psychiatric Diagnoses and Treatments Medical Diagnoses and Treatments/Surgeries  Cognitive Features That Contribute To Risk:  Thought constriction (tunnel vision)    Suicide Risk:  Minimal: No identifiable suicidal ideation.  Patients presenting with no risk factors but with morbid ruminations; may be classified as minimal risk based on the severity of the depressive symptoms  Plan Of Care/Follow-up recommendations:  Activity:  as tolerated Diet:  regular Other:     Patient is instructed prior to discharge to: Take all medications as prescribed by his/her mental healthcare provider. Report any adverse effects and or reactions from the medicines to his/her outpatient provider promptly. Patient has been instructed & cautioned: To not engage in alcohol and or illegal drug use while on prescription medicines. In the event of worsening symptoms, patient is instructed to call the crisis hotline, 911 and or go to the nearest ED for appropriate evaluation and treatment of symptoms. To follow-up with his/her primary care provider for your other medical issues, concerns and or health care needs.    Discharge medications: Seroquel 100mg  Topamax 50 mg daily, and 100mg  at bedtime  Lexapro 30 mg daily Gabapentin 100 mg TID for alcohol cravings/withdrawal  Recommend to discontinue Klonopin.  See notes from hospitalization-husband had reported concern that patient was abusing Klonopin in addition to abusing  alcohol.  Would recommend that Klonopin be discontinued but will defer to her outpatient provider.  This medication was not continued during her hospitalization at the [facility]  Patient provided with 7-day samples of medications as well as paper prescription for medications.   Disposition: home  Ival Bible, MD 10/11/2021, 8:32 AM

## 2021-10-12 ENCOUNTER — Telehealth (HOSPITAL_COMMUNITY): Payer: Self-pay | Admitting: Family Medicine

## 2021-10-12 NOTE — BH Assessment (Signed)
Care Management - Follow Up Discharges   Writer made contact with the patient. Patient reports that she has a follow up appointment at [facility] on the 2nd floor with Ludwig Clarks, NP.

## 2021-10-27 ENCOUNTER — Other Ambulatory Visit: Payer: Self-pay

## 2021-10-27 ENCOUNTER — Encounter: Payer: Self-pay | Admitting: Hematology and Oncology

## 2021-11-09 ENCOUNTER — Encounter (HOSPITAL_COMMUNITY): Payer: Self-pay | Admitting: Physician Assistant

## 2021-11-09 ENCOUNTER — Telehealth (INDEPENDENT_AMBULATORY_CARE_PROVIDER_SITE_OTHER): Payer: No Payment, Other | Admitting: Physician Assistant

## 2021-11-09 DIAGNOSIS — F319 Bipolar disorder, unspecified: Secondary | ICD-10-CM | POA: Diagnosis not present

## 2021-11-09 DIAGNOSIS — F1010 Alcohol abuse, uncomplicated: Secondary | ICD-10-CM

## 2021-11-09 DIAGNOSIS — F411 Generalized anxiety disorder: Secondary | ICD-10-CM | POA: Diagnosis not present

## 2021-11-09 MED ORDER — TOPIRAMATE 100 MG PO TABS
100.0000 mg | ORAL_TABLET | Freq: Two times a day (BID) | ORAL | 1 refills | Status: DC
  Filled 2021-11-09 – 2021-12-22 (×3): qty 60, 30d supply, fill #0

## 2021-11-09 MED ORDER — ESCITALOPRAM OXALATE 20 MG PO TABS
40.0000 mg | ORAL_TABLET | Freq: Every day | ORAL | 1 refills | Status: DC
  Filled 2021-11-09 – 2021-12-22 (×3): qty 60, 30d supply, fill #0

## 2021-11-09 MED ORDER — GABAPENTIN 100 MG PO CAPS
200.0000 mg | ORAL_CAPSULE | Freq: Three times a day (TID) | ORAL | 1 refills | Status: DC
  Filled 2021-11-09 – 2021-12-22 (×3): qty 180, 30d supply, fill #0

## 2021-11-09 NOTE — Progress Notes (Signed)
BH MD/PA/NP OP Progress Note  Virtual Visit via Video Note  I connected with Sara [name] on 11/09/21 at  5:00 PM EST by a video enabled telemedicine application and verified that I am speaking with the correct person using two identifiers.  Location: Patient: Home Provider: Clinic   I discussed the limitations of evaluation and management by telemedicine and the availability of in person appointments. The patient expressed understanding and agreed to proceed.  Follow Up Instructions:   I discussed the assessment and treatment plan with the patient. The patient was provided an opportunity to ask questions and all were answered. The patient agreed with the plan and demonstrated an understanding of the instructions.   The patient was advised to call back or seek an in-person evaluation if the symptoms worsen or if the condition fails to improve as anticipated.  I provided 19 minutes of non-face-to-face time during this encounter.  [name], PA    11/09/2021 11:49 PM Sara [name]  MRN:  [id number]  Chief Complaint: Follow up and medication management  HPI:   Sara Ambroise. [name] is a 45 year old female with a past psychiatric history significant for alcohol abuse disorder, bipolar 1 disorder, and generalized anxiety disorder who presents to [facility] via virtual video visit for follow-up and medication management.  Patient was last seen on 07/08/2021.  During the conclusion of that previous encounter, patient was taking the following medications:  Escitalopram (Lexapro) 30 mg daily Topiramate 50 mg daily/100 mg at bedtime Seroquel 100 mg at bedtime Gabapentin 100 mg 3 times daily Klonopin 0.5 mg 3 times daily  Patient reports that she was admitted to the [facility] due to worsening manic symptoms.  Prior to arriving at [facility], patient was seen at the [facility].  Patient stayed at [facility] for a couple of  days before being discharged.  Patient's medications have remained most of the same except that Klonopin was discontinued due to suspicions of benzodiazepine abuse along with alcohol.  Patient reports that she just feels as though her gabapentin is not helpful with her anxiety.  She reports waking up at night sweaty and in a panic.  She also states that she experienced panic episodes when going out.  Patient rates her anxiety at a 7 or 8 out of 10.  She reports that her depressive symptoms come and go but has noticed the following symptoms: sleeping too much, difficulty getting out of bed, and lack of interest in doing things.  Patient stressors include ongoing legal issues, unemployment, and financial instability.  A PHQ-9 screen was performed with the patient scoring a 16.  A GAD-7 screen was also performed with the patient scoring a 16.  Patient is alert and oriented x4, calm, cooperative, and fully engaged in conversation during the encounter.  She reports feeling sad and irritated.  Patient denies suicidal or homicidal ideations.  She further denies auditory or visual hallucinations and does not appear to be responding to internal/external stimuli.  Patient endorses good sleep and receives on average 8 to 9 hours of sleep each night.  Patient endorses good appetite and eats on average 2 meals per day.  Patient denies alcohol consumption, tobacco use, and illicit drug use.   Visit Diagnosis:    ICD-10-CM   1. Bipolar 1 disorder (HCC)  F31.9 escitalopram (LEXAPRO) 20 MG tablet    topiramate (TOPAMAX) 100 MG tablet    2. Generalized anxiety disorder  F41.1 escitalopram (LEXAPRO) 20  MG tablet    gabapentin (NEURONTIN) 100 MG capsule    3. Alcohol abuse  F10.10       Past Psychiatric History:  Generalized Anxiety Disorder Bipolar Disorder PTSD  Past Medical History:  Past Medical History:  Diagnosis Date   Alcohol abuse    Alcoholism ([place])    Anemia    Anxiety    Blood transfusion  without reported diagnosis    Cirrhosis ([place])    Depression    Esophageal varices with bleeding(456.0) 06/13/2014   GERD (gastroesophageal reflux disease)    Heart murmur    Patient states she may have   Menorrhagia    Pancytopenia ([place]) 01/15/2014   Pneumonia    Portal hypertension ([place])    S/P alcohol detoxification    2-3 days at behavioral health previously   UGI bleed 06/12/2014    Past Surgical History:  Procedure Laterality Date   CHOLECYSTECTOMY     ESOPHAGOGASTRODUODENOSCOPY N/A 06/12/2014   Procedure: ESOPHAGOGASTRODUODENOSCOPY (EGD);  Surgeon: Gatha Mayer, MD;  Location: [facility];  Service: Endoscopy;  Laterality: N/A;   ESOPHAGOGASTRODUODENOSCOPY (EGD) WITH PROPOFOL N/A 07/29/2014   Procedure: ESOPHAGOGASTRODUODENOSCOPY (EGD) WITH PROPOFOL;  Surgeon: Inda Castle, MD;  Location: [facility] END[facility]COPY;  Service: Endoscopy;  Laterality: N/A;   ESOPHAGOGASTRODUODENOSCOPY (EGD) WITH PROPOFOL N/A 01/20/2018   Procedure: ESOPHAGOGASTRODUODENOSCOPY (EGD) WITH PROPOFOL;  Surgeon: Mauri Pole, MD;  Location: [facility] ENDOSCOPY;  Service: Endoscopy;  Laterality: N/A;    Family Psychiatric History:  None  Family History:  Family History  Problem Relation Age of Onset   Colon polyps Mother    Hypertension Mother    Thyroid disease Mother    Alcoholism Mother    Alcoholism Father    Alcohol abuse Maternal Grandfather    Alcohol abuse Paternal Grandfather    Alcohol abuse Paternal Aunt    Alcohol abuse Maternal Uncle     Social History:  Social History   Socioeconomic History   Marital status: Married    Spouse name: [name]   Number of children: 2   Years of education: Associates   Highest education level: Not on file  Occupational History   Occupation: Paralegal  Tobacco Use   Smoking status: Former    Packs/day: 0.25    Types: Cigarettes    Quit date: 11/22/2018    Years since quitting: 2.9   Smokeless tobacco: Never  Substance and Sexual Activity   Alcohol  use: Yes    Alcohol/week: 0.0 standard drinks    Comment: Usually drinks 2-3 bottles of wine daily when drinking.    Drug use: No    Types: Cocaine, Marijuana    Comment: denies   Sexual activity: Never    Birth control/protection: None  Other Topics Concern   Not on file  Social History Narrative   Lives with husband and son. Daughter lives nearby. 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   Social Determinants of Health   Financial Resource Strain: Not on file  Food Insecurity: Not on file  Transportation Needs: Not on file  Physical Activity: Not on file  Stress: Not on file  Social Connections: Not on file    Allergies:  Allergies  Allergen Reactions   Morphine And Related Other (See Comments)    Slowed HR, lowered BP   Nsaids Other (See Comments)    Caused internal bleeding    Metabolic Disorder Labs: Lab Results  Component Value Date   HGBA1C 5.2 01/20/2021   MPG  102.54 01/20/2021   MPG 85.32 04/21/2019   Lab Results  Component Value Date   PROLACTIN 28.7 (H) 05/04/2017   Lab Results  Component Value Date   CHOL 154 01/20/2021   TRIG 120 01/20/2021   HDL 47 01/20/2021   CHOLHDL 3.3 01/20/2021   VLDL 24 01/20/2021   LDLCALC 83 01/20/2021   LDLCALC 76 04/21/2019   Lab Results  Component Value Date   TSH 1.153 01/20/2021   TSH 3.658 06/24/2020    Therapeutic Level Labs: No results found for: LITHIUM No results found for: VALPROATE No components found for:  CBMZ  Current Medications: Current Outpatient Medications  Medication Sig Dispense Refill   clonazePAM (KLONOPIN) 0.5 MG tablet Take 1 tablet (0.5 mg total) by mouth 2 (two) times daily as needed for anxiety. 60 tablet 0   escitalopram (LEXAPRO) 20 MG tablet Take 2 tablets (40 mg total) by mouth daily. 60 tablet 1   gabapentin (NEURONTIN) 100 MG capsule Take 2 capsules (200 mg total) by mouth 3 (three) times daily. 180 capsule 1   QUEtiapine (SEROQUEL) 100 MG tablet Take 1  tablet (100 mg total) by mouth at bedtime. 30 tablet 1   QUEtiapine (SEROQUEL) 100 MG tablet Take 1 tablet (100 mg total) by mouth at bedtime. 14 tablet 0   topiramate (TOPAMAX) 100 MG tablet Take 1 tablet (100 mg total) by mouth 2 (two) times daily. 60 tablet 1   No current facility-administered medications for this visit.     Musculoskeletal: Strength & Muscle Tone: Unable to assess due to telemedicine visit [place]:  Unable to assess due to telemedicine visit Patient leans:  Unable to assess due to telemedicine visit  Psychiatric Specialty Exam: Review of Systems  Psychiatric/Behavioral:  Negative for decreased concentration, dysphoric mood, hallucinations, self-injury, sleep disturbance and suicidal ideas. The patient is nervous/anxious. The patient is not hyperactive.    There were no vitals taken for this visit.There is no height or weight on file to calculate BMI.  General Appearance: Well Groomed  Eye Contact:  Good  Speech:  Clear and Coherent and Normal Rate  Volume:  Normal  Mood:  Anxious and Euthymic  Affect:  Appropriate and Congruent  Thought Process:  Coherent, Goal Directed, and Descriptions of Associations: Intact  Orientation:  Full (Time, Place, and Person)  Thought Content: WDL   Suicidal Thoughts:  No  Homicidal Thoughts:  No  Memory:  Immediate;   Good Recent;   Good Remote;   Good  Judgement:  Good  Insight:  Good  Psychomotor Activity:  Normal  Concentration:  Concentration: Good and Attention Span: Good  Recall:  Good  Fund of Knowledge: Good  Language: Good  Akathisia:  NA  Handed:  Right  AIMS (if indicated): not done  Assets:  Communication Skills Desire for Improvement Housing Social Support  ADL's:  Intact  Cognition: WNL  Sleep:  Good   Screenings: AIMS    Flowsheet Row Admission (Discharged) from 04/19/2019 in [place] [facility] Admission (Discharged) from 05/02/2017 in [facility] [facility]  AIMS Total Score 0 0      AUDIT    Flowsheet Row Admission (Discharged) from 04/19/2019 in [place] [facility] Admission (Discharged) from 05/02/2017 in [place] [facility] Admission (Discharged) from 12/22/2014 in [place] [facility] Admission (Discharged) from 02/02/2014 in [place] [facility] Admission (Discharged) from 09/27/2013 in [place]  INPATIENT ADULT [facility]  Alcohol Use Disorder Identification Test Final Score (AUDIT) 34 34 29 38 34      GAD-7    Flowsheet Row Video Visit from 11/09/2021 in [facility] Video Visit from 07/08/2021 in [facility] Video Visit from 05/13/2021 in [facility] Video Visit from 04/02/2021 in [facility] Video Visit from 02/17/2021 in [facility]  Total GAD-7 Score 16 18 16 15 11       PHQ2-9    Flowsheet Row Video Visit from 11/09/2021 in [facility] ED from 10/06/2021 in [place] [facility]PT Video Visit from 07/08/2021 in [facility] Video Visit from 05/13/2021 in [facility] Video Visit from 04/02/2021 in [place]  PHQ-2 Total Score 2 4 2 2 3   PHQ-9 Total Score 16 15 9 10 15       Flowsheet Row Video Visit from 11/09/2021 in [facility] ED from 10/07/2021 in [facility] from 10/06/2021 in [place] DEPT  C-SSRS RISK CATEGORY No Risk Low Risk Low Risk        Assessment and Plan:   Sara [name] is a 45 year old female with a past psychiatric history significant for alcohol abuse disorder, bipolar 1 disorder, and generalized anxiety disorder who  presents to [facility] via virtual video visit for follow-up and medication management.  Patient continues to endorse depressive symptoms as well as worsening anxiety and panic attacks.  Patient inquired about being placed back on Klonopin.  Provider informed patient that she would not be able to be placed back on Klonopin due to suspicions of past abuse with controlled substances.  Provider recommended increasing her dosage of Lexapro from 30 mg to 40 mg daily to manage her anxiety, panic episodes, and depressive symptoms.  Patient was also recommended increasing her gabapentin from 100 mg 3 times daily to 200 mg 3 times daily for the management of her anxiety attacks.  Lastly, patient was recommended topiramate 100 mg twice daily to manage her manic symptoms.  Patient was agreeable to recommendations.  Patient's medications to be e-prescribed to pharmacy of choice.  1. Bipolar 1 disorder ([place]) Patient to continue taking Seroquel 100 mg at bedtime for the management of her bipolar disorder  - escitalopram (LEXAPRO) 20 MG tablet; Take 2 tablets (40 mg total) by mouth daily.  Dispense: 60 tablet; Refill: 1 - topiramate (TOPAMAX) 100 MG tablet; Take 1 tablet (100 mg total) by mouth 2 (two) times daily.  Dispense: 60 tablet; Refill: 1  2. Generalized anxiety disorder  - escitalopram (LEXAPRO) 20 MG tablet; Take 2 tablets (40 mg total) by mouth daily.  Dispense: 60 tablet; Refill: 1 - gabapentin (NEURONTIN) 100 MG capsule; Take 2 capsules (200 mg total) by mouth 3 (three) times daily.  Dispense: 180 capsule; Refill: 1  3. Alcohol abuse  Patient to follow up in 2 months Provider spent a total of 19 minutes with the patient/reviewing patient's chart  [name], PA 11/09/2021, 11:49 PM

## 2021-11-10 ENCOUNTER — Other Ambulatory Visit: Payer: Self-pay

## 2021-11-10 ENCOUNTER — Encounter: Payer: Self-pay | Admitting: Hematology and Oncology

## 2021-11-17 ENCOUNTER — Other Ambulatory Visit: Payer: Self-pay

## 2021-11-22 ENCOUNTER — Other Ambulatory Visit: Payer: Self-pay

## 2021-11-22 ENCOUNTER — Encounter: Payer: Self-pay | Admitting: Hematology and Oncology

## 2021-12-22 ENCOUNTER — Other Ambulatory Visit: Payer: Self-pay

## 2021-12-22 ENCOUNTER — Other Ambulatory Visit (HOSPITAL_COMMUNITY): Payer: Self-pay | Admitting: Physician Assistant

## 2021-12-22 ENCOUNTER — Encounter: Payer: Self-pay | Admitting: Hematology and Oncology

## 2021-12-23 ENCOUNTER — Other Ambulatory Visit: Payer: Self-pay

## 2021-12-23 ENCOUNTER — Encounter: Payer: Self-pay | Admitting: Hematology and Oncology

## 2021-12-23 MED ORDER — QUETIAPINE FUMARATE 100 MG PO TABS
100.0000 mg | ORAL_TABLET | Freq: Every day | ORAL | 1 refills | Status: DC
  Filled 2021-12-23: qty 30, 30d supply, fill #0

## 2022-01-01 ENCOUNTER — Emergency Department (HOSPITAL_BASED_OUTPATIENT_CLINIC_OR_DEPARTMENT_OTHER): Payer: Medicaid Other | Admitting: Radiology

## 2022-01-01 ENCOUNTER — Other Ambulatory Visit: Payer: Self-pay

## 2022-01-01 ENCOUNTER — Encounter (HOSPITAL_BASED_OUTPATIENT_CLINIC_OR_DEPARTMENT_OTHER): Payer: Self-pay

## 2022-01-01 ENCOUNTER — Emergency Department (HOSPITAL_BASED_OUTPATIENT_CLINIC_OR_DEPARTMENT_OTHER)
Admission: EM | Admit: 2022-01-01 | Discharge: 2022-01-01 | Disposition: A | Discharge status: Home / Self Care | Payer: Medicaid Other | Attending: Emergency Medicine | Admitting: Emergency Medicine

## 2022-01-01 DIAGNOSIS — M25512 Pain in left shoulder: Secondary | ICD-10-CM | POA: Insufficient documentation

## 2022-01-01 DIAGNOSIS — R0602 Shortness of breath: Secondary | ICD-10-CM | POA: Insufficient documentation

## 2022-01-01 MED ORDER — DEXAMETHASONE SODIUM PHOSPHATE 10 MG/ML IJ SOLN
10.0000 mg | Freq: Once | INTRAMUSCULAR | Status: AC
  Administered 2022-01-01: 10 mg via INTRAMUSCULAR
  Filled 2022-01-01: qty 1

## 2022-01-01 NOTE — ED Provider Notes (Signed)
[place] EMERGENCY DEPT Provider Note   CSN: [id number] Arrival date & time: 01/01/22  1644     History  Chief Complaint  Patient presents with   Shoulder Pain    Sara [name] is a 46 y.o. female.  Patient with history of alcohol use disorder presents to the emergency department for evaluation of left-sided shoulder pain.  Symptoms began last night and were worse this morning.  She denies injuries or falls.  Pain is worse with movement.  She has pain that shoots down into her left arm.  No neck pain.  No severe headache.  No lower extremity weakness.  She reports some shortness of breath when she tries to move her arm because the pain is so bad.  Patient is unable to take NSAIDs or Tylenol.      Home Medications Prior to Admission medications   Medication Sig Start Date End Date Taking? Authorizing Provider  clonazePAM (KLONOPIN) 0.5 MG tablet Take 1 tablet (0.5 mg total) by mouth 2 (two) times daily as needed for anxiety. 10/09/21 10/09/22  Revonda Humphrey, NP  escitalopram (LEXAPRO) 20 MG tablet Take 2 tablets (40 mg total) by mouth daily. 11/09/21 11/09/22  Nwoko, Terese Door, PA  gabapentin (NEURONTIN) 100 MG capsule Take 2 capsules (200 mg total) by mouth 3 (three) times daily. 11/09/21   Nwoko, Terese Door, PA  QUEtiapine (SEROQUEL) 100 MG tablet Take 1 tablet (100 mg total) by mouth at bedtime. 10/09/21   Revonda Humphrey, NP  QUEtiapine (SEROQUEL) 100 MG tablet Take 1 tablet (100 mg total) by mouth at bedtime. 12/23/21   Nwoko, Terese Door, PA  topiramate (TOPAMAX) 100 MG tablet Take 1 tablet (100 mg total) by mouth 2 (two) times daily. 11/09/21   Nwoko, Terese Door, PA  pantoprazole (PROTONIX) 40 MG tablet Take 1 tablet (40 mg total) by mouth daily. 09/14/20 01/21/21  Fulp, Ander Gaster, MD      Allergies    Morphine and related and Nsaids    Review of Systems   Review of Systems  Physical Exam Updated Vital Signs BP 108/77 (BP Location: Right Arm)     Pulse 76    Temp 98.7 F (37.1 C)    Resp 16    Ht 5\' 4"  (1.626 m)    Wt 68 kg    SpO2 96%    BMI 25.73 kg/m  Physical Exam Vitals and nursing note reviewed.  Constitutional:      Appearance: She is well-developed. She is not diaphoretic.  HENT:     Head: Normocephalic and atraumatic.     Mouth/Throat:     Mouth: Mucous membranes are not dry.  Eyes:     Conjunctiva/sclera: Conjunctivae normal.  Neck:     Trachea: Trachea normal. No tracheal deviation.  Cardiovascular:     Rate and Rhythm: Normal rate and regular rhythm.     Pulses: No decreased pulses.          Radial pulses are 2+ on the right side and 2+ on the left side.     Heart sounds: Normal heart sounds, S1 normal and S2 normal. No murmur heard. Pulmonary:     Effort: Pulmonary effort is normal. No respiratory distress.     Breath sounds: No wheezing.  Chest:     Chest wall: No tenderness.  Abdominal:     General: Bowel sounds are normal.     Palpations: Abdomen is soft.     Tenderness: There is no abdominal tenderness.  There is no guarding or rebound.  Musculoskeletal:        General: Normal range of motion.     Right shoulder: No deformity, tenderness or bony tenderness. Normal range of motion.     Left shoulder: Tenderness and bony tenderness present. No deformity. Normal range of motion.     Left upper arm: No tenderness or bony tenderness.     Cervical back: Normal range of motion and neck supple. No tenderness. No muscular tenderness. Normal range of motion.     Comments: Patient with tenderness over the superior and anterior aspect of the shoulder.  Patient winces when I applied a point pressure over the area.  She is able to slowly abduct her arm to about 90 degrees but cannot go any higher.  She appears to be in discomfort with this.  Skin:    General: Skin is warm and dry.     Coloration: Skin is not pale.  Neurological:     Mental Status: She is alert.    ED Results / Procedures / Treatments   Labs (all  labs ordered are listed, but only abnormal results are displayed) Labs Reviewed - No data to display  ED ECG REPORT   Date: 01/01/2022  Rate: 70  Rhythm: normal sinus rhythm  QRS Axis: normal  Intervals: normal  ST/T Wave abnormalities: normal  Conduction Disutrbances:none  Narrative Interpretation:   Old EKG Reviewed: unchanged  I have personally reviewed the EKG tracing and agree with the computerized printout as noted.   Radiology DG Shoulder Left  Result Date: 01/01/2022 CLINICAL DATA:  Left shoulder pain, no known injury. EXAM: LEFT SHOULDER - 2+ VIEW COMPARISON:  Left shoulder radiographs 06/23/2020 FINDINGS: There is no evidence of fracture or dislocation. There is no evidence of arthropathy or other focal bone abnormality. Soft tissues are unremarkable. IMPRESSION: Negative. Electronically Signed   By: Audie Pinto M.D.   On: 01/01/2022 17:31    Procedures Procedures    Medications Ordered in ED Medications  dexamethasone (DECADRON) injection 10 mg (10 mg Intramuscular Given 01/01/22 1903)    ED Course/ Medical Decision Making/ A&P    Patient seen and examined. History obtained directly from patient and family at bedside.  Labs/EKG: Ordered EKG.  Imaging: Shoulder x-ray ordered in triage.  Personally reviewed and interpreted.  Agree no fracture or dislocation.  Medications/Fluids: Ordered: IM Decadron. Considered administration of: NSAIDs and Tylenol however patient is unable to take these.  Most recent vital signs reviewed and are as follows: BP 108/77 (BP Location: Right Arm)    Pulse 76    Temp 98.7 F (37.1 C)    Resp 16    Ht 5\' 4"  (1.626 m)    Wt 68 kg    SpO2 96%    BMI 25.73 kg/m   Initial impression: Shoulder pain, negative x-rays, upper extremity neurovascularly intact.  Will provide with sling, Ortho follow-up.  Discussed RICE protocol.                           Medical Decision Making Amount and/or Complexity of Data Reviewed Radiology:  ordered.  Risk Prescription drug management.   Patient with acute left shoulder pain, negative x-ray.  Upper extremity is neurovascularly intact.  Patient's pain is readily reproducible with movement and palpation.  Considered ACS, pneumothorax, PE.  Vital signs are reassuring, patient is not tachycardic and EKG without ischemic findings today.  Plan as above.  Final Clinical Impression(s) / ED Diagnoses Final diagnoses:  Acute pain of left shoulder    Rx / DC Orders ED Discharge Orders     None         Carlisle Cater, PA-C 01/01/22 1909    Lennice Sites, DO 01/01/22 2003

## 2022-01-01 NOTE — ED Notes (Signed)
EDP at bedside  

## 2022-01-01 NOTE — ED Triage Notes (Signed)
Patient here POV from Home with Shoulder Pain.  Pain began last PM and Intermittently awoke the patient from Sleep. No Acute Trauma/Injury. Pain is Constant and worse with certain movements.  NAD Noted during Triage. A&Ox4. GCS 15. Ambulatory.

## 2022-01-01 NOTE — Discharge Instructions (Signed)
Please read and follow all provided instructions.  Your diagnoses today include:  1. Acute pain of left shoulder     Tests performed today include: An x-ray of the affected area - does NOT show any broken bones Vital signs. See below for your results today.   Medications prescribed:  None  Take any prescribed medications only as directed.  Home care instructions:  Follow any educational materials contained in this packet Follow R.I.C.E. Protocol: R - rest your injury  I  - use ice on injury without applying directly to skin C - compress injury with bandage or splint E - elevate the injury as much as possible  Follow-up instructions: Please follow-up with your primary care provider or the provided orthopedic physician (bone specialist) if you continue to have significant pain in 1 week. In this case you may have a more severe injury that requires further care.   Return instructions:  Please return if your fingers are numb or tingling, appear gray or blue, or you have severe pain (also elevate the arm and loosen splint or wrap if you were given one) Please return to the Emergency Department if you experience worsening symptoms.  Please return if you have any other emergent concerns.  Additional Information:  Your vital signs today were: BP 108/77 (BP Location: Right Arm)    Pulse 76    Temp 98.7 F (37.1 C)    Resp 16    Ht 5\' 4"  (1.626 m)    Wt 68 kg    SpO2 96%    BMI 25.73 kg/m  If your blood pressure (BP) was elevated above 135/85 this visit, please have this repeated by your doctor within one month. --------------

## 2022-01-13 ENCOUNTER — Encounter (HOSPITAL_COMMUNITY): Payer: Self-pay

## 2022-01-13 ENCOUNTER — Telehealth (HOSPITAL_COMMUNITY): Payer: No Payment, Other | Admitting: Physician Assistant

## 2022-01-24 ENCOUNTER — Other Ambulatory Visit (HOSPITAL_COMMUNITY)
Admission: EM | Admit: 2022-01-24 | Discharge: 2022-01-31 | Disposition: A | Discharge status: Home / Self Care | Payer: No Payment, Other | Attending: Psychiatry | Admitting: Psychiatry

## 2022-01-24 DIAGNOSIS — Z20822 Contact with and (suspected) exposure to covid-19: Secondary | ICD-10-CM | POA: Diagnosis not present

## 2022-01-24 DIAGNOSIS — F1020 Alcohol dependence, uncomplicated: Secondary | ICD-10-CM | POA: Diagnosis present

## 2022-01-24 DIAGNOSIS — F319 Bipolar disorder, unspecified: Secondary | ICD-10-CM | POA: Insufficient documentation

## 2022-01-24 DIAGNOSIS — F411 Generalized anxiety disorder: Secondary | ICD-10-CM

## 2022-01-24 DIAGNOSIS — F419 Anxiety disorder, unspecified: Secondary | ICD-10-CM | POA: Insufficient documentation

## 2022-01-24 LAB — POCT URINE DRUG SCREEN - MANUAL ENTRY (I-SCREEN)
POC Amphetamine UR: NOT DETECTED
POC Buprenorphine (BUP): NOT DETECTED
POC Cocaine UR: NOT DETECTED
POC Marijuana UR: NOT DETECTED
POC Methadone UR: NOT DETECTED
POC Methamphetamine UR: NOT DETECTED
POC Morphine: NOT DETECTED
POC Oxazepam (BZO): POSITIVE — AB
POC Oxycodone UR: NOT DETECTED
POC Secobarbital (BAR): NOT DETECTED

## 2022-01-24 LAB — POC SARS CORONAVIRUS 2 AG -  ED: SARS Coronavirus 2 Ag: NEGATIVE

## 2022-01-24 LAB — POCT PREGNANCY, URINE: Preg Test, Ur: NEGATIVE

## 2022-01-24 LAB — POC SARS CORONAVIRUS 2 AG: SARSCOV2ONAVIRUS 2 AG: NEGATIVE

## 2022-01-24 MED ORDER — ADULT MULTIVITAMIN W/MINERALS CH
1.0000 | ORAL_TABLET | Freq: Every day | ORAL | Status: DC
  Administered 2022-01-24 – 2022-01-30 (×7): 1 via ORAL
  Filled 2022-01-24 (×4): qty 1
  Filled 2022-01-24: qty 14
  Filled 2022-01-24 (×2): qty 1
  Filled 2022-01-24: qty 14
  Filled 2022-01-24 (×2): qty 1

## 2022-01-24 MED ORDER — LORAZEPAM 1 MG PO TABS
1.0000 mg | ORAL_TABLET | Freq: Three times a day (TID) | ORAL | Status: AC
  Administered 2022-01-26 – 2022-01-27 (×3): 1 mg via ORAL
  Filled 2022-01-24 (×3): qty 1

## 2022-01-24 MED ORDER — LOPERAMIDE HCL 2 MG PO CAPS: 2.0000 mg | ORAL_CAPSULE | ORAL | Status: AC | PRN

## 2022-01-24 MED ORDER — LORAZEPAM 1 MG PO TABS
1.0000 mg | ORAL_TABLET | Freq: Every day | ORAL | Status: AC
  Administered 2022-01-29: 1 mg via ORAL
  Filled 2022-01-24: qty 1

## 2022-01-24 MED ORDER — ONDANSETRON 4 MG PO TBDP
4.0000 mg | ORAL_TABLET | Freq: Four times a day (QID) | ORAL | Status: AC | PRN
  Administered 2022-01-25 – 2022-01-27 (×3): 4 mg via ORAL
  Filled 2022-01-24 (×3): qty 1

## 2022-01-24 MED ORDER — ESCITALOPRAM OXALATE 20 MG PO TABS
40.0000 mg | ORAL_TABLET | Freq: Every day | ORAL | Status: DC
  Administered 2022-01-25 – 2022-01-30 (×6): 40 mg via ORAL
  Filled 2022-01-24 (×7): qty 4
  Filled 2022-01-24: qty 28

## 2022-01-24 MED ORDER — THIAMINE HCL 100 MG PO TABS
100.0000 mg | ORAL_TABLET | Freq: Every day | ORAL | Status: DC
  Administered 2022-01-25 – 2022-01-30 (×6): 100 mg via ORAL
  Filled 2022-01-24 (×7): qty 1

## 2022-01-24 MED ORDER — TOPIRAMATE 100 MG PO TABS
100.0000 mg | ORAL_TABLET | Freq: Two times a day (BID) | ORAL | Status: DC
  Administered 2022-01-24 – 2022-01-30 (×13): 100 mg via ORAL
  Filled 2022-01-24 (×9): qty 1
  Filled 2022-01-24: qty 28
  Filled 2022-01-24 (×5): qty 1

## 2022-01-24 MED ORDER — LORAZEPAM 1 MG PO TABS: 1.0000 mg | ORAL_TABLET | Freq: Four times a day (QID) | ORAL | Status: AC | PRN

## 2022-01-24 MED ORDER — LORAZEPAM 1 MG PO TABS
1.0000 mg | ORAL_TABLET | Freq: Two times a day (BID) | ORAL | Status: AC
  Administered 2022-01-27 – 2022-01-28 (×2): 1 mg via ORAL
  Filled 2022-01-24 (×2): qty 1

## 2022-01-24 MED ORDER — LORAZEPAM 1 MG PO TABS
1.0000 mg | ORAL_TABLET | Freq: Four times a day (QID) | ORAL | Status: AC
Start: 2022-01-24 — End: 2022-01-26
  Administered 2022-01-24 – 2022-01-26 (×6): 1 mg via ORAL
  Filled 2022-01-24 (×6): qty 1

## 2022-01-24 MED ORDER — TRAZODONE HCL 50 MG PO TABS: 50.0000 mg | ORAL_TABLET | Freq: Every evening | ORAL | Status: DC | PRN

## 2022-01-24 MED ORDER — HYDROXYZINE HCL 25 MG PO TABS
25.0000 mg | ORAL_TABLET | Freq: Three times a day (TID) | ORAL | Status: DC | PRN
  Administered 2022-01-24 – 2022-01-30 (×6): 25 mg via ORAL
  Filled 2022-01-24 (×6): qty 1

## 2022-01-24 MED ORDER — GABAPENTIN 100 MG PO CAPS
200.0000 mg | ORAL_CAPSULE | Freq: Three times a day (TID) | ORAL | Status: DC
  Administered 2022-01-24 – 2022-01-30 (×19): 200 mg via ORAL
  Filled 2022-01-24 (×14): qty 2
  Filled 2022-01-24: qty 84
  Filled 2022-01-24 (×6): qty 2

## 2022-01-24 NOTE — ED Provider Notes (Signed)
Behavioral Health Admission H&P [facility] & OBS)  Date: 01/24/22 Patient Name: Eshal Propps MRN: [id number] Chief Complaint:  alcohol abuse  Chief Complaint  Patient presents with   Addiction Problem      Diagnoses:  Final diagnoses:  Bipolar 1 disorder ([place])  Alcohol use disorder, severe, dependence ([place])    HPI: 61 female present to [place],  with alcohol use disorder and depression,  per the patient she has been drinking 5 beer a day and 1/2 bottle vodka.  Pt report her husband and son has move to [facility],  and how she is homeless.  This precipitate her increase alcohol intake.    Past medical his includes,  Bipolar disorder 1,  Alcohol used disorder,     Per the patient she was in detox for 2 weeks ago at [facility], she spent 8 day before leaving.  Pt denies illicit drug used,  Pt denies hallucination,  suicidal ideation, homicidal ideation,  alcohol seizure.    Pt current medication include Lexapro 20 mg daily, Gabapentin 100 mg 2 capsul three time daily,   Topamax 100 mg twice daily,  Seroquel 100 mg qhs.  Will continue patient home medication  Recommendation:  FBC, detox intervention , QTC  was prolong will hold Seroquel   PHQ 2-9:  Flowsheet Row Video Visit from 11/09/2021 in [facility] ED from 10/06/2021 in [place] DEPT Video Visit from 07/08/2021 in [facility]  Thoughts that you would be better off dead, or of hurting yourself in some way Not at all Several days Not at all  PHQ-9 Total Score '16 15 9       '$ Flowsheet Row ED from 01/24/2022 in [facility] ED from 01/01/2022 in [place] [facility] Video Visit from 11/09/2021 in [place] No Risk No Risk No Risk        Total Time spent with patient: 15 minutes  Musculoskeletal  Strength & Muscle Tone: within normal  limits Gait & Station: normal Patient leans: N/A  Psychiatric Specialty Exam  Presentation General Appearance: Appropriate for Environment  Eye Contact:Good  Speech:Clear and Coherent  Speech Volume:Normal  Handedness:Right   Mood and Affect  Mood:Anxious; Depressed  Affect:Flat; Depressed   Thought Process  Thought Processes:Coherent  Descriptions of Associations:Circumstantial  Orientation:Full (Time, Place and Person)  Thought Content:Abstract Reasoning  Diagnosis of Schizophrenia or Schizoaffective disorder in past: No   Hallucinations:Hallucinations: None  Ideas of Reference:None  Suicidal Thoughts:Suicidal Thoughts: No  Homicidal Thoughts:Homicidal Thoughts: No   Sensorium  Memory:Immediate Fair  Judgment:Fair  Insight:Fair   Executive Functions  Concentration:Good  Attention Span:Good  [place] of Knowledge:Good  Language:Good   Psychomotor Activity  Psychomotor Activity:Psychomotor Activity: Normal   Assets  Assets:Communication Skills   Sleep  Sleep:Sleep: Poor   Nutritional Assessment (For OBS and FBC admissions only) Has the patient had a weight loss or gain of 10 pounds or more in the last 3 months?: No Has the patient had a decrease in food intake/or appetite?: No Does the patient have dental problems?: No Does the patient have eating habits or behaviors that may be indicators of an eating disorder including binging or inducing vomiting?: No Has the patient recently lost weight without trying?: 0 Has the patient been eating poorly because of a decreased appetite?: 0 Malnutrition Screening Tool Score: 0    Physical Exam Constitutional:  General: She is not in acute distress.    Appearance: She is not ill-appearing, toxic-appearing or diaphoretic.  HENT:     Head: Normocephalic.     Nose: Nose normal.  Eyes:     Pupils: Pupils are equal, round, and reactive to light.  Cardiovascular:     Rate and  Rhythm: Normal rate.  Pulmonary:     Effort: Pulmonary effort is normal.  Abdominal:     General: Bowel sounds are normal.  Musculoskeletal:        General: Normal range of motion.     Cervical back: Normal range of motion.  Skin:    General: Skin is warm and dry.  Neurological:     Mental Status: She is alert and oriented to person, place, and time.  Psychiatric:        Mood and Affect: Mood normal.   Review of Systems  Constitutional: Negative.  Negative for chills, diaphoresis, fever, malaise/fatigue and weight loss.  HENT: Negative.    Eyes: Negative.   Respiratory: Negative.    Cardiovascular: Negative.   Gastrointestinal: Negative.   Genitourinary: Negative.   Musculoskeletal: Negative.   Skin:  Negative for rash.  Neurological: Negative.   Endo/Heme/Allergies: Negative.   Psychiatric/Behavioral:  Positive for depression and substance abuse. The patient is nervous/anxious and has insomnia.    Blood pressure 92/64, pulse 89, temperature 98.4 F (36.9 C), temperature source Oral, resp. rate 18, SpO2 91 %. There is no height or weight on file to calculate BMI.  Past Psychiatric History: Bipolar 1,  depression, alcohol abuse disorder  Is the patient at risk to self? No  Has the patient been a risk to self in the past 6 months? No .    Has the patient been a risk to self within the distant past? No   Is the patient a risk to others? No   Has the patient been a risk to others in the past 6 months? No   Has the patient been a risk to others within the distant past? No   Past Medical History:  Past Medical History:  Diagnosis Date   Alcohol abuse    Alcoholism ([place])    Anemia    Anxiety    Blood transfusion without reported diagnosis    Cirrhosis ([place])    Depression    Esophageal varices with bleeding(456.0) 06/13/2014   GERD (gastroesophageal reflux disease)    Heart murmur    Patient states she may have   Menorrhagia    Pancytopenia ([place]) 01/15/2014    Pneumonia    Portal hypertension ([place])    S/P alcohol detoxification    2-3 days at behavioral health previously   UGI bleed 06/12/2014    Past Surgical History:  Procedure Laterality Date   CHOLECYSTECTOMY     ESOPHAGOGASTRODUODENOSCOPY N/A 06/12/2014   Procedure: ESOPHAGOGASTRODUODENOSCOPY (EGD);  Surgeon: Gatha Mayer, MD;  Location: [facility];  Service: Endoscopy;  Laterality: N/A;   ESOPHAGOGASTRODUODENOSCOPY (EGD) WITH PROPOFOL N/A 07/29/2014   Procedure: ESOPHAGOGASTRODUODENOSCOPY (EGD) WITH PROPOFOL;  Surgeon: Inda Castle, MD;  Location: [facility] ENDOSCOPY;  Service: Endoscopy;  Laterality: N/A;   ESOPHAGOGASTRODUODENOSCOPY (EGD) WITH PROPOFOL N/A 01/20/2018   Procedure: ESOPHAGOGASTRODUODENOSCOPY (EGD) WITH PROPOFOL;  Surgeon: Mauri Pole, MD;  Location: [facility] ENDOSCOPY;  Service: Endoscopy;  Laterality: N/A;    Family History:  Family History  Problem Relation Age of Onset   Colon polyps Mother    Hypertension Mother    Thyroid disease Mother  Alcoholism Mother    Alcoholism Father    Alcohol abuse Maternal Grandfather    Alcohol abuse Paternal Grandfather    Alcohol abuse Paternal Aunt    Alcohol abuse Maternal Uncle     Social History:  Social History   Socioeconomic History   Marital status: Married    Spouse name: [name]   Number of children: 2   Years of education: Associates   Highest education level: Not on file  Occupational History   Occupation: Paralegal  Tobacco Use   Smoking status: Former    Packs/day: 0.25    Types: Cigarettes    Quit date: 11/22/2018    Years since quitting: 3.1   Smokeless tobacco: Never  Substance and Sexual Activity   Alcohol use: Yes    Alcohol/week: 0.0 standard drinks    Comment: Usually drinks 2-3 bottles of wine daily when drinking.    Drug use: No    Types: Cocaine, Marijuana    Comment: denies   Sexual activity: Never    Birth control/protection: None  Other Topics Concern    Not on file  Social History Narrative   Lives with husband and son. Daughter lives nearby. 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   Social Determinants of Health   Financial Resource Strain: Not on file  Food Insecurity: Not on file  Transportation Needs: Not on file  Physical Activity: Not on file  Stress: Not on file  Social Connections: Not on file  Intimate Partner Violence: Not on file    SDOH:  SDOH Screenings   Alcohol Screen: Not on file  Depression (PHQ2-9): Medium Risk   PHQ-2 Score: 16  Financial Resource Strain: Not on file  Food Insecurity: Not on file  Housing: Not on file  Physical Activity: Not on file  Social Connections: Not on file  Stress: Not on file  Tobacco Use: Medium Risk   Smoking Tobacco Use: Former   Smokeless Tobacco Use: Never   Passive Exposure: Not on file  Transportation Needs: Not on file    Last Labs:  Admission on 01/24/2022  Component Date Value Ref Range Status   POC Amphetamine UR 01/24/2022 None Detected  NONE DETECTED (Cut Off Level 1000 ng/mL) Final   POC Secobarbital (BAR) 01/24/2022 None Detected  NONE DETECTED (Cut Off Level 300 ng/mL) Final   POC Buprenorphine (BUP) 01/24/2022 None Detected  NONE DETECTED (Cut Off Level 10 ng/mL) Final   POC Oxazepam (BZO) 01/24/2022 Positive (A)  NONE DETECTED (Cut Off Level 300 ng/mL) Final   POC Cocaine UR 01/24/2022 None Detected  NONE DETECTED (Cut Off Level 300 ng/mL) Final   POC Methamphetamine UR 01/24/2022 None Detected  NONE DETECTED (Cut Off Level 1000 ng/mL) Final   POC Morphine 01/24/2022 None Detected  NONE DETECTED (Cut Off Level 300 ng/mL) Final   POC Oxycodone UR 01/24/2022 None Detected  NONE DETECTED (Cut Off Level 100 ng/mL) Final   POC Methadone UR 01/24/2022 None Detected  NONE DETECTED (Cut Off Level 300 ng/mL) Final   POC Marijuana UR 01/24/2022 None Detected  NONE DETECTED (Cut Off Level 50 ng/mL) Final   Preg Test, Ur  01/24/2022 NEGATIVE  NEGATIVE Final   Comment:        THE SENSITIVITY OF THIS METHODOLOGY IS >24 mIU/mL   Admission on 10/07/2021, Discharged on 10/11/2021  Component Date Value Ref Range Status   POC Amphetamine UR 10/07/2021 None Detected  NONE DETECTED (Cut Off Level 1000 ng/mL) Final  POC Secobarbital (BAR) 10/07/2021 None Detected  NONE DETECTED (Cut Off Level 300 ng/mL) Final   POC Buprenorphine (BUP) 10/07/2021 None Detected  NONE DETECTED (Cut Off Level 10 ng/mL) Final   POC Oxazepam (BZO) 10/07/2021 Positive (A)  NONE DETECTED (Cut Off Level 300 ng/mL) Final   POC Cocaine UR 10/07/2021 None Detected  NONE DETECTED (Cut Off Level 300 ng/mL) Final   POC Methamphetamine UR 10/07/2021 None Detected  NONE DETECTED (Cut Off Level 1000 ng/mL) Final   POC Morphine 10/07/2021 None Detected  NONE DETECTED (Cut Off Level 300 ng/mL) Final   POC Oxycodone UR 10/07/2021 None Detected  NONE DETECTED (Cut Off Level 100 ng/mL) Final   POC Methadone UR 10/07/2021 None Detected  NONE DETECTED (Cut Off Level 300 ng/mL) Final   POC Marijuana UR 10/07/2021 None Detected  NONE DETECTED (Cut Off Level 50 ng/mL) Final  Admission on 10/06/2021, Discharged on 10/07/2021  Component Date Value Ref Range Status   Lipase 10/06/2021 93 (H)  11 - 51 U/L Final   Performed at [facility], [place] [address] [place], [place] [address]   Sodium 10/06/2021 140  135 - 145 mmol/L Final   Potassium 10/06/2021 3.3 (L)  3.5 - 5.1 mmol/L Final   Chloride 10/06/2021 110  98 - 111 mmol/L Final   CO2 10/06/2021 20 (L)  22 - 32 mmol/L Final   Glucose, Bld 10/06/2021 119 (H)  70 - 99 mg/dL Final   Glucose reference range applies only to samples taken after fasting for at least 8 hours.   BUN 10/06/2021 9  6 - 20 mg/dL Final   Creatinine, Ser 10/06/2021 0.52  0.44 - 1.00 mg/dL Final   Calcium 10/06/2021 8.4 (L)  8.9 - 10.3 mg/dL Final   Total Protein 10/06/2021 7.2  6.5 - 8.1 g/dL Final    Albumin 10/06/2021 3.1 (L)  3.5 - 5.0 g/dL Final   AST 10/06/2021 49 (H)  15 - 41 U/L Final   ALT 10/06/2021 24  0 - 44 U/L Final   Alkaline Phosphatase 10/06/2021 88  38 - 126 U/L Final   Total Bilirubin 10/06/2021 0.8  0.3 - 1.2 mg/dL Final   GFR, Estimated 10/06/2021 >60  >60 mL/min Final   Comment: (NOTE) Calculated using the CKD-EPI Creatinine Equation (2021)    Anion gap 10/06/2021 10  5 - 15 Final   Performed at [facility], [place] [address] [place], [place] [address]   WBC 10/06/2021 2.2 (L)  4.0 - 10.5 K/uL Final   RBC 10/06/2021 3.93  3.87 - 5.11 MIL/uL Final   Hemoglobin 10/06/2021 11.4 (L)  12.0 - 15.0 g/dL Final   HCT 10/06/2021 35.1 (L)  36.0 - 46.0 % Final   MCV 10/06/2021 89.3  80.0 - 100.0 fL Final   MCH 10/06/2021 29.0  26.0 - 34.0 pg Final   MCHC 10/06/2021 32.5  30.0 - 36.0 g/dL Final   RDW 10/06/2021 17.4 (H)  11.5 - 15.5 % Final   Platelets 10/06/2021 76 (L)  150 - 400 K/uL Final   Comment: SPECIMEN CHECKED FOR CLOTS Immature Platelet Fraction may be clinically indicated, consider ordering this additional test [id number] REPEATED TO VERIFY PLATELET COUNT CONFIRMED BY SMEAR    nRBC 10/06/2021 0.0  0.0 - 0.2 % Final   Performed at [facility], [place] [address], [place], [place] [address]   Color, Urine 10/07/2021 YELLOW  YELLOW Final   APPearance 10/07/2021 CLOUDY (A)  CLEAR Final  Specific Gravity, Urine 10/07/2021 1.011  1.005 - 1.030 Final   pH 10/07/2021 5.0  5.0 - 8.0 Final   Glucose, UA 10/07/2021 NEGATIVE  NEGATIVE mg/dL Final   Hgb urine dipstick 10/07/2021 SMALL (A)  NEGATIVE Final   Bilirubin Urine 10/07/2021 NEGATIVE  NEGATIVE Final   Ketones, ur 10/07/2021 5 (A)  NEGATIVE mg/dL Final   Protein, ur 10/07/2021 NEGATIVE  NEGATIVE mg/dL Final   Nitrite 10/07/2021 NEGATIVE  NEGATIVE Final   Leukocytes,Ua 10/07/2021 LARGE (A)  NEGATIVE Final   RBC / HPF 10/07/2021 6-10  0 - 5 RBC/hpf  Final   WBC, UA 10/07/2021 11-20  0 - 5 WBC/hpf Final   Bacteria, UA 10/07/2021 MANY (A)  NONE SEEN Final   Squamous Epithelial / LPF 10/07/2021 11-20  0 - 5 Final   Mucus 10/07/2021 PRESENT   Final   Performed at [place] [address] [place], [place] [address]   I-stat hCG, quantitative 10/06/2021 <5.0  <5 mIU/mL Final   Comment 3 10/06/2021          Final   Comment:   GEST. AGE      CONC.  (mIU/mL)   <=1 WEEK        5 - 50     2 WEEKS       50 - 500     3 WEEKS       100 - 10,000     4 WEEKS     1,000 - 30,000        FEMALE AND NON-PREGNANT FEMALE:     LESS THAN 5 mIU/mL    Alcohol, Ethyl (B) 10/06/2021 285 (H)  <10 mg/dL Final   Comment: (NOTE) Lowest detectable limit for serum alcohol is 10 mg/dL.  For medical purposes only. Performed at [facility], [place] [address] [place], [place] [address]    SARS Coronavirus 2 by RT PCR 10/07/2021 NEGATIVE  NEGATIVE Final   Comment: (NOTE) SARS-CoV-2 target nucleic acids are NOT DETECTED.  The SARS-CoV-2 RNA is generally detectable in upper respiratory specimens during the acute phase of infection. The lowest concentration of SARS-CoV-2 viral copies this assay can detect is 138 copies/mL. A negative result does not preclude SARS-Cov-2 infection and should not be used as the sole basis for treatment or other patient management decisions. A negative result may occur with  improper specimen collection/handling, submission of specimen other than nasopharyngeal swab, presence of viral mutation(s) within the areas targeted by this assay, and inadequate number of viral copies(<138 copies/mL). A negative result must be combined with clinical observations, patient history, and epidemiological information. The expected result is Negative.  Fact Sheet for Patients:  EntrepreneurPulse.com.au  Fact Sheet for Healthcare Providers:   IncredibleEmployment.be  This test is no                          t yet approved or cleared by the [place] FDA and  has been authorized for detection and/or diagnosis of SARS-CoV-2 by FDA under an Emergency Use Authorization (EUA). This EUA will remain  in effect (meaning this test can be used) for the duration of the COVID-19 declaration under Section 564(b)(1) of the Act, 21 U.S.C.section 360bbb-3(b)(1), unless the authorization is terminated  or revoked sooner.       Influenza A by PCR 10/07/2021 NEGATIVE  NEGATIVE Final   Influenza B by PCR 10/07/2021 NEGATIVE  NEGATIVE Final   Comment: (NOTE) The  Xpert Xpress SARS-CoV-2/FLU/RSV plus assay is intended as an aid in the diagnosis of influenza from Nasopharyngeal swab specimens and should not be used as a sole basis for treatment. Nasal washings and aspirates are unacceptable for Xpert Xpress SARS-CoV-2/FLU/RSV testing.  Fact Sheet for Patients: EntrepreneurPulse.com.au  Fact Sheet for Healthcare Providers: IncredibleEmployment.be  This test is not yet approved or cleared by the [place] FDA and has been authorized for detection and/or diagnosis of SARS-CoV-2 by FDA under an Emergency Use Authorization (EUA). This EUA will remain in effect (meaning this test can be used) for the duration of the COVID-19 declaration under Section 564(b)(1) of the Act, 21 U.S.C. section 360bbb-3(b)(1), unless the authorization is terminated or revoked.  Performed at [facility], [place] [address], [place], [place] [address]     Allergies: Morphine and related and Nsaids  PTA Medications: (Not in a hospital admission)   Medical Decision Making  At this time patient need to be admit to the [facility] for detox,  Lab Orders         Resp Panel by RT-PCR (Flu A&B, Covid) Nasopharyngeal Swab         CBC with Differential/Platelet         Comprehensive metabolic  panel         Hemoglobin A1c         Ethanol         Lipid panel         TSH         Urinalysis, Routine w reflex microscopic         Pregnancy, urine         POC SARS Coronavirus 2 Ag-ED - Nasal Swab         POCT Urine Drug Screen - ([facility])         Pregnancy, urine POC          Recommendations  Based on my evaluation the patient appears to have an emergency medical condition for which I recommend the patient be transferred to the emergency department for further evaluation.  Evette Georges, NP 01/24/22  10:00 PM

## 2022-01-24 NOTE — Progress Notes (Signed)
?   01/24/22 1945  ?[facility] Triage Screening (Walk-ins at [facility] only)  ?How Did You Hear About [place]? Self  ?What Is the Reason for Your Visit/Call Today? Pt has a history of depression and alcohol use. She says she is drinking approximately a fifth of vodka daily and cannot stop. She says her husband and son are moving to [facility] without her and she is now homeless. She described feeling depressed, irritable, and impulsive. She denies current SI, HI, or psychotic symptoms. She is currently intoxicated and denies use of substances other than alcohol.  ?How Long Has This Been Causing You Problems? 1-6 months  ?Have You Recently Had Any Thoughts About Hurting Yourself? Yes  ?How long ago did you have thoughts about hurting yourself? Earlier today, no plan or intent.  ?Are You Planning to Commit Suicide/Harm Yourself At This time? No  ?Have you Recently Had Thoughts About [place]? No  ?Are You Planning To Harm Someone At This Time? No  ?Are you currently experiencing any auditory, visual or other hallucinations? No  ?Have You Used Any Alcohol or Drugs in the Past 24 Hours? Yes  ?How long ago did you use Drugs or Alcohol? Today  ?What Did You Use and How Much? Drank 5 cans of beer and a half a bottle of vodka  ?Do you have any current medical co-morbidities that require immediate attention? No  ?Clinician description of patient physical appearance/behavior: Pt is casually dressed and appears intoxicated with slowed responses, slightly slurred speech. She is tearful.  ?What Do You Feel Would Help You the Most Today? Alcohol or Drug Use Treatment;Treatment for Depression or other mood problem  ?If access to [facility]gent [facility] was not available, would you have sought care in the Emergency Department? Yes  ?Determination of Need Urgent (48 hours)  ?Options For Referral Facility-Based Crisis;Inpatient Hospitalization  ? ? ?

## 2022-01-24 NOTE — ED Notes (Signed)
Pt pending 2 Hr COVID result and transfer to [facility].  Pt calm & cooperative at present.  Comfort measures given. ?
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
## 2022-01-25 ENCOUNTER — Encounter (HOSPITAL_COMMUNITY): Payer: Self-pay | Admitting: Psychiatry

## 2022-01-25 DIAGNOSIS — Z20822 Contact with and (suspected) exposure to covid-19: Secondary | ICD-10-CM | POA: Diagnosis not present

## 2022-01-25 DIAGNOSIS — F1020 Alcohol dependence, uncomplicated: Secondary | ICD-10-CM | POA: Diagnosis not present

## 2022-01-25 DIAGNOSIS — F419 Anxiety disorder, unspecified: Secondary | ICD-10-CM | POA: Diagnosis not present

## 2022-01-25 DIAGNOSIS — F319 Bipolar disorder, unspecified: Secondary | ICD-10-CM | POA: Diagnosis not present

## 2022-01-25 LAB — COMPREHENSIVE METABOLIC PANEL
ALT: 22 U/L (ref 0–44)
ALT: 19 U/L (ref 0–44)
AST: 36 U/L (ref 15–41)
AST: 33 U/L (ref 15–41)
Albumin: 3.3 g/dL — ABNORMAL LOW (ref 3.5–5.0)
Albumin: 3.1 g/dL — ABNORMAL LOW (ref 3.5–5.0)
Alkaline Phosphatase: 77 U/L (ref 38–126)
Alkaline Phosphatase: 70 U/L (ref 38–126)
Anion gap: 10 (ref 5–15)
Anion gap: 6 (ref 5–15)
BUN: 6 mg/dL (ref 6–20)
BUN: 7 mg/dL (ref 6–20)
CO2: 17 mmol/L — ABNORMAL LOW (ref 22–32)
CO2: 19 mmol/L — ABNORMAL LOW (ref 22–32)
Calcium: 8.2 mg/dL — ABNORMAL LOW (ref 8.9–10.3)
Calcium: 8.4 mg/dL — ABNORMAL LOW (ref 8.9–10.3)
Chloride: 109 mmol/L (ref 98–111)
Chloride: 114 mmol/L — ABNORMAL HIGH (ref 98–111)
Creatinine, Ser: 0.68 mg/dL (ref 0.44–1.00)
Creatinine, Ser: 0.53 mg/dL (ref 0.44–1.00)
GFR, Estimated: >60 mL/min (ref >60)
GFR, Estimated: >60 mL/min (ref >60)
Glucose, Bld: 90 mg/dL (ref 70–99)
Glucose, Bld: 83 mg/dL (ref 70–99)
Potassium: 2.8 mmol/L — ABNORMAL LOW (ref 3.5–5.1)
Potassium: 4 mmol/L (ref 3.5–5.1)
Sodium: 136 mmol/L (ref 135–145)
Sodium: 139 mmol/L (ref 135–145)
Total Bilirubin: 0.9 mg/dL (ref 0.3–1.2)
Total Bilirubin: 0.8 mg/dL (ref 0.3–1.2)
Total Protein: 7 g/dL (ref 6.5–8.1)
Total Protein: 6.7 g/dL (ref 6.5–8.1)

## 2022-01-25 LAB — CBC WITH DIFFERENTIAL/PLATELET
Abs Immature Granulocytes: 0.01 10*3/uL (ref 0.00–0.07)
Basophils Absolute: 0 10*3/uL (ref 0.0–0.1)
Basophils Relative: 1 %
Eosinophils Absolute: 0.1 10*3/uL (ref 0.0–0.5)
Eosinophils Relative: 1 %
HCT: 34.4 % — ABNORMAL LOW (ref 36.0–46.0)
Hemoglobin: 11.4 g/dL — ABNORMAL LOW (ref 12.0–15.0)
Immature Granulocytes: 0 %
Lymphocytes Relative: 40 %
Lymphs Abs: 1.6 10*3/uL (ref 0.7–4.0)
MCH: 30.1 pg (ref 26.0–34.0)
MCHC: 33.1 g/dL (ref 30.0–36.0)
MCV: 90.8 fL (ref 80.0–100.0)
Monocytes Absolute: 0.4 10*3/uL (ref 0.1–1.0)
Monocytes Relative: 9 %
Neutro Abs: 2 10*3/uL (ref 1.7–7.7)
Neutrophils Relative %: 49 %
Platelets: 99 10*3/uL — ABNORMAL LOW (ref 150–400)
RBC: 3.79 MIL/uL — ABNORMAL LOW (ref 3.87–5.11)
RDW: 16 % — ABNORMAL HIGH (ref 11.5–15.5)
WBC: 4 10*3/uL (ref 4.0–10.5)
nRBC: 0 % (ref 0.0–0.2)

## 2022-01-25 LAB — LIPID PANEL
Cholesterol: 137 mg/dL (ref 0–200)
HDL: 32 mg/dL — ABNORMAL LOW (ref >40)
LDL Cholesterol: 67 mg/dL (ref 0–99)
Total CHOL/HDL Ratio: 4.3 RATIO
Triglycerides: 189 mg/dL — ABNORMAL HIGH (ref <150)
VLDL: 38 mg/dL (ref 0–40)

## 2022-01-25 LAB — URINALYSIS, ROUTINE W REFLEX MICROSCOPIC
Bilirubin Urine: NEGATIVE
Glucose, UA: NEGATIVE mg/dL
Hgb urine dipstick: NEGATIVE
Ketones, ur: NEGATIVE mg/dL
Leukocytes,Ua: NEGATIVE
Nitrite: NEGATIVE
Protein, ur: NEGATIVE mg/dL
Specific Gravity, Urine: 1.006 (ref 1.005–1.030)
pH: 7 (ref 5.0–8.0)

## 2022-01-25 LAB — TSH: TSH: 3.012 u[IU]/mL (ref 0.350–4.500)

## 2022-01-25 LAB — PREGNANCY, URINE: Preg Test, Ur: NEGATIVE

## 2022-01-25 LAB — RESP PANEL BY RT-PCR (FLU A&B, COVID) ARPGX2
Influenza A by PCR: NEGATIVE
Influenza B by PCR: NEGATIVE
SARS Coronavirus 2 by RT PCR: NEGATIVE

## 2022-01-25 LAB — HEMOGLOBIN A1C
Hgb A1c MFr Bld: 4.7 % — ABNORMAL LOW (ref 4.8–5.6)
Mean Plasma Glucose: 88.19 mg/dL

## 2022-01-25 LAB — ETHANOL: Alcohol, Ethyl (B): 201 mg/dL — ABNORMAL HIGH (ref <10)

## 2022-01-25 MED ORDER — POTASSIUM CHLORIDE CRYS ER 20 MEQ PO TBCR
40.0000 meq | EXTENDED_RELEASE_TABLET | ORAL | Status: AC
  Administered 2022-01-25 (×2): 40 meq via ORAL
  Filled 2022-01-25 (×2): qty 2

## 2022-01-25 NOTE — ED Notes (Signed)
Pt resting quietly with eyes closed in room.  Breathing is even and unlabored.  Will continue to monitor for safety. ?

## 2022-01-25 NOTE — ED Notes (Addendum)
Pt blood pressure was low (84/55) when vitals signs was taken. Pt denied symptoms of dizziness and headache. Pt stated " I am okay" Provider notified. Will continue to monitor for safety. ?

## 2022-01-25 NOTE — ED Notes (Signed)
No pain or discomfort noted/ reported. Pt alert, oriented, and ambulatory.  Breathing is even and  unlabored.  Denies SI, HI, and AVH at this time.  Will continue to monitor for safety.  ?

## 2022-01-25 NOTE — ED Provider Notes (Signed)
Behavioral Health Progress Note  Date and Time: 01/25/2022 12:07 PM Name: Sara [name] MRN:  [id number]  Subjective:   Sara [name] is a 46 yo female w PMH significant for bipolar disorder, anxiety, PTSD, misuse of benzodiazepines,and alcohol use disorder who was admitted to the [facility] on 01/24/2022 requesting alcohol treatment. Etoh 201; UDS +bzd.   Patient seen and chart reviewed-patient has been medication compliant and has been appropriate with staff and peers on the unit; most recent CIWA 1.  Patient interviewed in conjunction with LCSW this morning.  Patient describes her mood as "I am fine".  She states that she has been consuming approximately 1/5 of vodka daily and reports alcohol withdrawal symptoms of nausea and chills at this time.  She denies vomiting and states that she was able to eat cereal this morning without issue.  She states that her last drink was just prior to presentation to the [facility] yesterday.  She states that her daughter is getting married in May at which point her husband and son will be moving to [facility] but that she will not be going with them.  She cites her support system as individuals in her group therapy at [facility].  She states "my medications are good" and reports that she has been compliant with them.  She denies SI/HI/AVH and indicates that she would like to go to residential rehab.  She states that she has had times when she has been detoxing from alcohol in the past while she will experience hallucinations; however, she denies experiencing these occur currently and per chart review she did not experience those during her last stay at the [facility]  Discussed with patient that she has a court date next week and advised her to contact her lawyer to see about moving this court date as this could be a barrier to residential rehab.   Patient states that she does not have her lawyer's number; LCSW offered to look up contact number for lawyer if name can be  provided.Patient verbalized understanding and was in agreement. No other physical complaints or concerns at this time.    Diagnosis:  Final diagnoses:  Bipolar 1 disorder ([place])  Alcohol use disorder, severe, dependence ([place])    Total Time spent with patient: 30 minutes  Past Psychiatric History: AUD, severe; bipolar disorder; mdd; SIMD Past Medical History:  Past Medical History:  Diagnosis Date   Alcohol abuse    Alcoholism ([place])    Anemia    Anxiety    Blood transfusion without reported diagnosis    Cirrhosis ([place])    Depression    Esophageal varices with bleeding(456.0) 06/13/2014   GERD (gastroesophageal reflux disease)    Heart murmur    Patient states she may have   Menorrhagia    Pancytopenia ([place]) 01/15/2014   Pneumonia    Portal hypertension ([place])    S/P alcohol detoxification    2-3 days at behavioral health previously   UGI bleed 06/12/2014    Past Surgical History:  Procedure Laterality Date   CHOLECYSTECTOMY     ESOPHAGOGASTRODUODENOSCOPY N/A 06/12/2014   Procedure: ESOPHAGOGASTRODUODENOSCOPY (EGD);  Surgeon: Gatha Mayer, MD;  Location: [facility];  Service: Endoscopy;  Laterality: N/A;   ESOPHAGOGASTRODUODENOSCOPY (EGD) WITH PROPOFOL N/A 07/29/2014   Procedure: ESOPHAGOGASTRODUODENOSCOPY (EGD) WITH PROPOFOL;  Surgeon: Inda Castle, MD;  Location: [facility];  Service: Endoscopy;  Laterality: N/A;   ESOPHAGOGASTRODUODENOSCOPY (EGD) WITH PROPOFOL N/A 01/20/2018   Procedure: ESOPHAGOGASTRODUODENOSCOPY (EGD) WITH PROPOFOL;  Surgeon: Silverio Decamp,  Venia Minks, MD;  Location: [facility];  Service: Endoscopy;  Laterality: N/A;   Family History:  Family History  Problem Relation Age of Onset   Colon polyps Mother    Hypertension Mother    Thyroid disease Mother    Alcoholism Mother    Alcoholism Father    Alcohol abuse Maternal Grandfather    Alcohol abuse Paternal Grandfather    Alcohol abuse Paternal Aunt    Alcohol abuse Maternal Uncle    Family  Psychiatric  History:  per chart, mother, father, MGF and PGF, aunt and uncle with alcohol abuse Social History:  Social History   Substance and Sexual Activity  Alcohol Use Yes   Alcohol/week: 0.0 standard drinks   Comment: Usually drinks 2-3 bottles of wine daily when drinking.      Social History   Substance and Sexual Activity  Drug Use No   Types: Cocaine, Marijuana   Comment: denies    Social History   Socioeconomic History   Marital status: Married    Spouse name: [name]   Number of children: 2   Years of education: Associates   Highest education level: Not on file  Occupational History   Occupation: Paralegal  Tobacco Use   Smoking status: Former    Packs/day: 0.25    Types: Cigarettes    Quit date: 11/22/2018    Years since quitting: 3.1   Smokeless tobacco: Never  Substance and Sexual Activity   Alcohol use: Yes    Alcohol/week: 0.0 standard drinks    Comment: Usually drinks 2-3 bottles of wine daily when drinking.    Drug use: No    Types: Cocaine, Marijuana    Comment: denies   Sexual activity: Never    Birth control/protection: None  Other Topics Concern   Not on file  Social History Narrative   Lives with husband and son. Daughter lives nearby. 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   Social Determinants of Health   Financial Resource Strain: Not on file  Food Insecurity: Not on file  Transportation Needs: Not on file  Physical Activity: Not on file  Stress: Not on file  Social Connections: Not on file   SDOH:  SDOH Screenings   Alcohol Screen: Not on file  Depression (PHQ2-9): Medium Risk   PHQ-2 Score: 16  Financial Resource Strain: Not on file  Food Insecurity: Not on file  Housing: Not on file  Physical Activity: Not on file  Social Connections: Not on file  Stress: Not on file  Tobacco Use: Medium Risk   Smoking Tobacco Use: Former   Smokeless Tobacco Use: Never   Passive Exposure: Not on file   Transportation Needs: Not on file   Additional Social History:    Pain Medications: See MAR Prescriptions: See MAR Over the Counter: See MAR History of alcohol / drug use?: Yes Longest period of sobriety (when/how long): 9 months in 2017 Negative Consequences of Use: Personal relationships, Work / Youth worker Withdrawal Symptoms: DTs, Sweats, Tremors, Blackouts, Agitation, Irritability Name of Substance 1: Alcohol 1 - Age of First Use: 25 1 - Amount (size/oz): Approximately 1 fifth of vodka 1 - Frequency: Daily 1 - Duration: Ongoing 1 - Last Use / Amount: 01/24/2022 1 - Method of Aquiring: Store 1- Route of Use: Oral ingestion                  Sleep: Fair  Appetite:   decreased 2/2 nausea  Current Medications:  Current  Facility-Administered Medications  Medication Dose Route Frequency Provider Last Rate Last Admin   escitalopram (LEXAPRO) tablet 40 mg  40 mg Oral Daily Evette Georges, NP   40 mg at 01/25/22 0930   gabapentin (NEURONTIN) capsule 200 mg  200 mg Oral TID Evette Georges, NP   200 mg at 01/25/22 0930   hydrOXYzine (ATARAX) tablet 25 mg  25 mg Oral TID PRN Evette Georges, NP   25 mg at 01/24/22 2301   loperamide (IMODIUM) capsule 2-4 mg  2-4 mg Oral PRN Evette Georges, NP       LORazepam (ATIVAN) tablet 1 mg  1 mg Oral Q6H PRN Evette Georges, NP       LORazepam (ATIVAN) tablet 1 mg  1 mg Oral QID Evette Georges, NP   1 mg at 01/25/22 0354   Followed by   Derrill Memo ON 01/26/2022] LORazepam (ATIVAN) tablet 1 mg  1 mg Oral TID Evette Georges, NP       Followed by   Derrill Memo ON 01/27/2022] LORazepam (ATIVAN) tablet 1 mg  1 mg Oral BID Evette Georges, NP       Followed by   Derrill Memo ON 01/29/2022] LORazepam (ATIVAN) tablet 1 mg  1 mg Oral Daily Evette Georges, NP       multivitamin with minerals tablet 1 tablet  1 tablet Oral Daily Evette Georges, NP   1 tablet at 01/25/22 0930   ondansetron (ZOFRAN-ODT) disintegrating tablet 4 mg  4 mg Oral Q6H PRN Evette Georges, NP   4 mg at 01/25/22  1153   thiamine tablet 100 mg  100 mg Oral Daily Evette Georges, NP   100 mg at 01/25/22 0930   topiramate (TOPAMAX) tablet 100 mg  100 mg Oral BID Evette Georges, NP   100 mg at 01/25/22 0930   Current Outpatient Medications  Medication Sig Dispense Refill   escitalopram (LEXAPRO) 20 MG tablet Take 2 tablets (40 mg total) by mouth daily. 60 tablet 1   gabapentin (NEURONTIN) 100 MG capsule Take 2 capsules (200 mg total) by mouth 3 (three) times daily. 180 capsule 1   Multiple Vitamin (MULTIVITAMIN WITH MINERALS) TABS tablet Take 1 tablet by mouth daily.     QUEtiapine (SEROQUEL) 100 MG tablet Take 1 tablet (100 mg total) by mouth at bedtime. 30 tablet 1   topiramate (TOPAMAX) 100 MG tablet Take 1 tablet (100 mg total) by mouth 2 (two) times daily. 60 tablet 1    Labs  Lab Results:  Admission on 01/24/2022  Component Date Value Ref Range Status   SARS Coronavirus 2 by RT PCR 01/24/2022 NEGATIVE  NEGATIVE Final   Comment: (NOTE) SARS-CoV-2 target nucleic acids are NOT DETECTED.  The SARS-CoV-2 RNA is generally detectable in upper respiratory specimens during the acute phase of infection. The lowest concentration of SARS-CoV-2 viral copies this assay can detect is 138 copies/mL. A negative result does not preclude SARS-Cov-2 infection and should not be used as the sole basis for treatment or other patient management decisions. A negative result may occur with  improper specimen collection/handling, submission of specimen other than nasopharyngeal swab, presence of viral mutation(s) within the areas targeted by this assay, and inadequate number of viral copies(<138 copies/mL). A negative result must be combined with clinical observations, patient history, and epidemiological information. The expected result is Negative.  Fact Sheet for Patients:  EntrepreneurPulse.com.au  Fact Sheet for Healthcare Providers:  IncredibleEmployment.be  This test is  no  t yet approved or cleared by the [place] and  has been authorized for detection and/or diagnosis of SARS-CoV-2 by FDA under an Emergency Use Authorization (EUA). This EUA will remain  in effect (meaning this test can be used) for the duration of the COVID-19 declaration under Section 564(b)(1) of the Act, 21 U.S.C.section 360bbb-3(b)(1), unless the authorization is terminated  or revoked sooner.       Influenza A by PCR 01/24/2022 NEGATIVE  NEGATIVE Final   Influenza B by PCR 01/24/2022 NEGATIVE  NEGATIVE Final   Comment: (NOTE) The Xpert Xpress SARS-CoV-2/FLU/RSV plus assay is intended as an aid in the diagnosis of influenza from Nasopharyngeal swab specimens and should not be used as a sole basis for treatment. Nasal washings and aspirates are unacceptable for Xpert Xpress SARS-CoV-2/FLU/RSV testing.  Fact Sheet for Patients: EntrepreneurPulse.com.au  Fact Sheet for Healthcare Providers: IncredibleEmployment.be  This test is not yet approved or cleared by the [place] FDA and has been authorized for detection and/or diagnosis of SARS-CoV-2 by FDA under an Emergency Use Authorization (EUA). This EUA will remain in effect (meaning this test can be used) for the duration of the COVID-19 declaration under Section 564(b)(1) of the Act, 21 U.S.C. section 360bbb-3(b)(1), unless the authorization is terminated or revoked.  Performed at [facility], [place] [address], [place], [place] [address]    SARS Coronavirus 2 Ag 01/24/2022 Negative  Negative Preliminary   WBC 01/24/2022 4.0  4.0 - 10.5 K/uL Final   RBC 01/24/2022 3.79 (L)  3.87 - 5.11 MIL/uL Final   Hemoglobin 01/24/2022 11.4 (L)  12.0 - 15.0 g/dL Final   HCT 01/24/2022 34.4 (L)  36.0 - 46.0 % Final   MCV 01/24/2022 90.8  80.0 - 100.0 fL Final   MCH 01/24/2022 30.1  26.0 - 34.0 pg Final   MCHC 01/24/2022 33.1  30.0 - 36.0 g/dL Final   RDW  01/24/2022 16.0 (H)  11.5 - 15.5 % Final   Platelets 01/24/2022 99 (L)  150 - 400 K/uL Final   Comment: Immature Platelet Fraction may be clinically indicated, consider ordering this additional test [id number] CONSISTENT WITH PREVIOUS RESULT REPEATED TO VERIFY    nRBC 01/24/2022 0.0  0.0 - 0.2 % Final   Neutrophils Relative % 01/24/2022 49  % Final   Neutro Abs 01/24/2022 2.0  1.7 - 7.7 K/uL Final   Lymphocytes Relative 01/24/2022 40  % Final   Lymphs Abs 01/24/2022 1.6  0.7 - 4.0 K/uL Final   Monocytes Relative 01/24/2022 9  % Final   Monocytes Absolute 01/24/2022 0.4  0.1 - 1.0 K/uL Final   Eosinophils Relative 01/24/2022 1  % Final   Eosinophils Absolute 01/24/2022 0.1  0.0 - 0.5 K/uL Final   Basophils Relative 01/24/2022 1  % Final   Basophils Absolute 01/24/2022 0.0  0.0 - 0.1 K/uL Final   Immature Granulocytes 01/24/2022 0  % Final   Abs Immature Granulocytes 01/24/2022 0.01  0.00 - 0.07 K/uL Final   Performed at [facility], [place] [address], [place], [place] [address]   Sodium 01/24/2022 136  135 - 145 mmol/L Final   Potassium 01/24/2022 2.8 (L)  3.5 - 5.1 mmol/L Final   Chloride 01/24/2022 109  98 - 111 mmol/L Final   CO2 01/24/2022 17 (L)  22 - 32 mmol/L Final   Glucose, Bld 01/24/2022 90  70 - 99 mg/dL Final   Glucose reference range applies only to samples taken after fasting for  at least 8 hours.   BUN 01/24/2022 6  6 - 20 mg/dL Final   Creatinine, Ser 01/24/2022 0.68  0.44 - 1.00 mg/dL Final   Calcium 01/24/2022 8.2 (L)  8.9 - 10.3 mg/dL Final   Total Protein 01/24/2022 7.0  6.5 - 8.1 g/dL Final   Albumin 01/24/2022 3.3 (L)  3.5 - 5.0 g/dL Final   AST 01/24/2022 36  15 - 41 U/L Final   ALT 01/24/2022 22  0 - 44 U/L Final   Alkaline Phosphatase 01/24/2022 77  38 - 126 U/L Final   Total Bilirubin 01/24/2022 0.9  0.3 - 1.2 mg/dL Final   GFR, Estimated 01/24/2022 >60  >60 mL/min Final   Comment: (NOTE) Calculated using the CKD-EPI Creatinine Equation (2021)     Anion gap 01/24/2022 10  5 - 15 Final   Performed at [facility], [place] [address], [place], [place] [address]   Hgb A1c MFr Bld 01/24/2022 4.7 (L)  4.8 - 5.6 % Final   Comment: (NOTE) Pre diabetes:          5.7%-6.4%  Diabetes:              >6.4%  Glycemic control for   <7.0% adults with diabetes    Mean Plasma Glucose 01/24/2022 88.19  mg/dL Final   Performed at [facility], [place] [address] [place], [place] [address]   Alcohol, Ethyl (B) 01/24/2022 201 (H)  <10 mg/dL Final   Comment: (NOTE) Lowest detectable limit for serum alcohol is 10 mg/dL.  For medical purposes only. Performed at [facility], [place] [address], [place], [place] [address]    TSH 01/24/2022 3.012  0.350 - 4.500 uIU/mL Final   Comment: Performed by a 3rd Generation assay with a functional sensitivity of <=0.01 uIU/mL. Performed at [facility], [place] [address] [place], [place] [address]    Color, Urine 01/24/2022 YELLOW  YELLOW Final   APPearance 01/24/2022 CLEAR  CLEAR Final   Specific Gravity, Urine 01/24/2022 1.006  1.005 - 1.030 Final   pH 01/24/2022 7.0  5.0 - 8.0 Final   Glucose, UA 01/24/2022 NEGATIVE  NEGATIVE mg/dL Final   Hgb urine dipstick 01/24/2022 NEGATIVE  NEGATIVE Final   Bilirubin Urine 01/24/2022 NEGATIVE  NEGATIVE Final   Ketones, ur 01/24/2022 NEGATIVE  NEGATIVE mg/dL Final   Protein, ur 01/24/2022 NEGATIVE  NEGATIVE mg/dL Final   Nitrite 01/24/2022 NEGATIVE  NEGATIVE Final   Leukocytes,Ua 01/24/2022 NEGATIVE  NEGATIVE Final   Performed at [facility], [place] [address], [place], [place] [address]   Preg Test, Ur 01/24/2022 NEGATIVE  NEGATIVE Final   Comment:        THE SENSITIVITY OF THIS METHODOLOGY IS >20 mIU/mL. Performed at [facility], [place] [address], [place], [place] [address]    POC Amphetamine UR 01/24/2022 None Detected  NONE DETECTED (Cut Off Level 1000 ng/mL) Final   POC Secobarbital (BAR) 01/24/2022 None Detected  NONE DETECTED (Cut Off  Level 300 ng/mL) Final   POC Buprenorphine (BUP) 01/24/2022 None Detected  NONE DETECTED (Cut Off Level 10 ng/mL) Final   POC Oxazepam (BZO) 01/24/2022 Positive (A)  NONE DETECTED (Cut Off Level 300 ng/mL) Final   POC Cocaine UR 01/24/2022 None Detected  NONE DETECTED (Cut Off Level 300 ng/mL) Final   POC Methamphetamine UR 01/24/2022 None Detected  NONE DETECTED (Cut Off Level 1000 ng/mL) Final   POC Morphine 01/24/2022 None Detected  NONE DETECTED (  Cut Off Level 300 ng/mL) Final   POC Oxycodone UR 01/24/2022 None Detected  NONE DETECTED (Cut Off Level 100 ng/mL) Final   POC Methadone UR 01/24/2022 None Detected  NONE DETECTED (Cut Off Level 300 ng/mL) Final   POC Marijuana UR 01/24/2022 None Detected  NONE DETECTED (Cut Off Level 50 ng/mL) Final   Preg Test, Ur 01/24/2022 NEGATIVE  NEGATIVE Final   Comment:        THE SENSITIVITY OF THIS METHODOLOGY IS >24 mIU/mL    SARSCOV2ONAVIRUS 2 AG 01/24/2022 NEGATIVE  NEGATIVE Final   Comment: (NOTE) SARS-CoV-2 antigen NOT DETECTED.   Negative results are presumptive.  Negative results do not preclude SARS-CoV-2 infection and should not be used as the sole basis for treatment or other patient management decisions, including infection  control decisions, particularly in the presence of clinical signs and  symptoms consistent with COVID-19, or in those who have been in contact with the virus.  Negative results must be combined with clinical observations, patient history, and epidemiological information. The expected result is Negative.  Fact Sheet for Patients: HandmadeRecipes.com.cy  Fact Sheet for Healthcare Providers: FuneralLife.at  This test is not yet approved or cleared by the [place] FDA and  has been authorized for detection and/or diagnosis of SARS-CoV-2 by FDA under an Emergency Use Authorization (EUA).  This EUA will remain in effect (meaning this test can be used) for the  duration of  the COV                          ID-19 declaration under Section 564(b)(1) of the Act, 21 U.S.C. section 360bbb-3(b)(1), unless the authorization is terminated or revoked sooner.     Cholesterol 01/24/2022 137  0 - 200 mg/dL Final   Triglycerides 01/24/2022 189 (H)  <150 mg/dL Final   HDL 01/24/2022 32 (L)  >40 mg/dL Final   Total CHOL/HDL Ratio 01/24/2022 4.3  RATIO Final   VLDL 01/24/2022 38  0 - 40 mg/dL Final   LDL Cholesterol 01/24/2022 67  0 - 99 mg/dL Final   Comment:        Total Cholesterol/HDL:CHD Risk Coronary Heart Disease Risk Table                     Men   Women  1/2 Average Risk   3.4   3.3  Average Risk       5.0   4.4  2 X Average Risk   9.6   7.1  3 X Average Risk  23.4   11.0        Use the calculated Patient Ratio above and the CHD Risk Table to determine the patient's CHD Risk.        ATP III CLASSIFICATION (LDL):  <100     mg/dL   Optimal  100-129  mg/dL   Near or Above                    Optimal  130-159  mg/dL   Borderline  160-189  mg/dL   High  >190     mg/dL   Very High Performed at [place] [address] [place], [place] [address]    Sodium 01/25/2022 139  135 - 145 mmol/L Final   Potassium 01/25/2022 4.0  3.5 - 5.1 mmol/L Final   NO VISIBLE HEMOLYSIS   Chloride 01/25/2022 114 (H)  98 - 111 mmol/L Final  CO2 01/25/2022 19 (L)  22 - 32 mmol/L Final   Glucose, Bld 01/25/2022 83  70 - 99 mg/dL Final   Glucose reference range applies only to samples taken after fasting for at least 8 hours.   BUN 01/25/2022 7  6 - 20 mg/dL Final   Creatinine, Ser 01/25/2022 0.53  0.44 - 1.00 mg/dL Final   Calcium 01/25/2022 8.4 (L)  8.9 - 10.3 mg/dL Final   Total Protein 01/25/2022 6.7  6.5 - 8.1 g/dL Final   Albumin 01/25/2022 3.1 (L)  3.5 - 5.0 g/dL Final   AST 01/25/2022 33  15 - 41 U/L Final   ALT 01/25/2022 19  0 - 44 U/L Final   Alkaline Phosphatase 01/25/2022 70  38 - 126 U/L Final   Total Bilirubin 01/25/2022 0.8  0.3 - 1.2  mg/dL Final   GFR, Estimated 01/25/2022 >60  >60 mL/min Final   Comment: (NOTE) Calculated using the CKD-EPI Creatinine Equation (2021)    Anion gap 01/25/2022 6  5 - 15 Final   Performed at [place] [address], [place], [place] [address]  Admission on 10/07/2021, Discharged on 10/11/2021  Component Date Value Ref Range Status   POC Amphetamine UR 10/07/2021 None Detected  NONE DETECTED (Cut Off Level 1000 ng/mL) Final   POC Secobarbital (BAR) 10/07/2021 None Detected  NONE DETECTED (Cut Off Level 300 ng/mL) Final   POC Buprenorphine (BUP) 10/07/2021 None Detected  NONE DETECTED (Cut Off Level 10 ng/mL) Final   POC Oxazepam (BZO) 10/07/2021 Positive (A)  NONE DETECTED (Cut Off Level 300 ng/mL) Final   POC Cocaine UR 10/07/2021 None Detected  NONE DETECTED (Cut Off Level 300 ng/mL) Final   POC Methamphetamine UR 10/07/2021 None Detected  NONE DETECTED (Cut Off Level 1000 ng/mL) Final   POC Morphine 10/07/2021 None Detected  NONE DETECTED (Cut Off Level 300 ng/mL) Final   POC Oxycodone UR 10/07/2021 None Detected  NONE DETECTED (Cut Off Level 100 ng/mL) Final   POC Methadone UR 10/07/2021 None Detected  NONE DETECTED (Cut Off Level 300 ng/mL) Final   POC Marijuana UR 10/07/2021 None Detected  NONE DETECTED (Cut Off Level 50 ng/mL) Final  Admission on 10/06/2021, Discharged on 10/07/2021  Component Date Value Ref Range Status   Lipase 10/06/2021 93 (H)  11 - 51 U/L Final   Performed at [facility], [place] [address], [place], [place] [address]   Sodium 10/06/2021 140  135 - 145 mmol/L Final   Potassium 10/06/2021 3.3 (L)  3.5 - 5.1 mmol/L Final   Chloride 10/06/2021 110  98 - 111 mmol/L Final   CO2 10/06/2021 20 (L)  22 - 32 mmol/L Final   Glucose, Bld 10/06/2021 119 (H)  70 - 99 mg/dL Final   Glucose reference range applies only to samples taken after fasting for at least 8 hours.   BUN 10/06/2021 9  6 - 20 mg/dL Final   Creatinine, Ser 10/06/2021 0.52   0.44 - 1.00 mg/dL Final   Calcium 10/06/2021 8.4 (L)  8.9 - 10.3 mg/dL Final   Total Protein 10/06/2021 7.2  6.5 - 8.1 g/dL Final   Albumin 10/06/2021 3.1 (L)  3.5 - 5.0 g/dL Final   AST 10/06/2021 49 (H)  15 - 41 U/L Final   ALT 10/06/2021 24  0 - 44 U/L Final   Alkaline Phosphatase 10/06/2021 88  38 - 126 U/L Final   Total Bilirubin 10/06/2021 0.8  0.3 - 1.2 mg/dL Final  GFR, Estimated 10/06/2021 >60  >60 mL/min Final   Comment: (NOTE) Calculated using the CKD-EPI Creatinine Equation (2021)    Anion gap 10/06/2021 10  5 - 15 Final   Performed at [facility], [place] [address], [place], [place] [address]   WBC 10/06/2021 2.2 (L)  4.0 - 10.5 K/uL Final   RBC 10/06/2021 3.93  3.87 - 5.11 MIL/uL Final   Hemoglobin 10/06/2021 11.4 (L)  12.0 - 15.0 g/dL Final   HCT 10/06/2021 35.1 (L)  36.0 - 46.0 % Final   MCV 10/06/2021 89.3  80.0 - 100.0 fL Final   MCH 10/06/2021 29.0  26.0 - 34.0 pg Final   MCHC 10/06/2021 32.5  30.0 - 36.0 g/dL Final   RDW 10/06/2021 17.4 (H)  11.5 - 15.5 % Final   Platelets 10/06/2021 76 (L)  150 - 400 K/uL Final   Comment: SPECIMEN CHECKED FOR CLOTS Immature Platelet Fraction may be clinically indicated, consider ordering this additional test [id number] REPEATED TO VERIFY PLATELET COUNT CONFIRMED BY SMEAR    nRBC 10/06/2021 0.0  0.0 - 0.2 % Final   Performed at [facility], [place] [address] [place], [place] [address]   Color, Urine 10/07/2021 YELLOW  YELLOW Final   APPearance 10/07/2021 CLOUDY (A)  CLEAR Final   Specific Gravity, Urine 10/07/2021 1.011  1.005 - 1.030 Final   pH 10/07/2021 5.0  5.0 - 8.0 Final   Glucose, UA 10/07/2021 NEGATIVE  NEGATIVE mg/dL Final   Hgb urine dipstick 10/07/2021 SMALL (A)  NEGATIVE Final   Bilirubin Urine 10/07/2021 NEGATIVE  NEGATIVE Final   Ketones, ur 10/07/2021 5 (A)  NEGATIVE mg/dL Final   Protein, ur 10/07/2021 NEGATIVE  NEGATIVE mg/dL Final   Nitrite 10/07/2021 NEGATIVE  NEGATIVE  Final   Leukocytes,Ua 10/07/2021 LARGE (A)  NEGATIVE Final   RBC / HPF 10/07/2021 6-10  0 - 5 RBC/hpf Final   WBC, UA 10/07/2021 11-20  0 - 5 WBC/hpf Final   Bacteria, UA 10/07/2021 MANY (A)  NONE SEEN Final   Squamous Epithelial / LPF 10/07/2021 11-20  0 - 5 Final   Mucus 10/07/2021 PRESENT   Final   Performed at [facility], [place] [address] [place], [place] [address]   I-stat hCG, quantitative 10/06/2021 <5.0  <5 mIU/mL Final   Comment 3 10/06/2021          Final   Comment:   GEST. AGE      CONC.  (mIU/mL)   <=1 WEEK        5 - 50     2 WEEKS       50 - 500     3 WEEKS       100 - 10,000     4 WEEKS     1,000 - 30,000        FEMALE AND NON-PREGNANT FEMALE:     LESS THAN 5 mIU/mL    Alcohol, Ethyl (B) 10/06/2021 285 (H)  <10 mg/dL Final   Comment: (NOTE) Lowest detectable limit for serum alcohol is 10 mg/dL.  For medical purposes only. Performed at [facility], [place] [address], [place], [place] [address]    SARS Coronavirus 2 by RT PCR 10/07/2021 NEGATIVE  NEGATIVE Final   Comment: (NOTE) SARS-CoV-2 target nucleic acids are NOT DETECTED.  The SARS-CoV-2 RNA is generally detectable in upper respiratory specimens during the acute phase of infection. The lowest concentration of SARS-CoV-2 viral copies this assay can detect  is 138 copies/mL. A negative result does not preclude SARS-Cov-2 infection and should not be used as the sole basis for treatment or other patient management decisions. A negative result may occur with  improper specimen collection/handling, submission of specimen other than nasopharyngeal swab, presence of viral mutation(s) within the areas targeted by this assay, and inadequate number of viral copies(<138 copies/mL). A negative result must be combined with clinical observations, patient history, and epidemiological information. The expected result is Negative.  Fact Sheet for Patients:   EntrepreneurPulse.com.au  Fact Sheet for Healthcare Providers:  IncredibleEmployment.be  This test is no                          t yet approved or cleared by the [place] FDA and  has been authorized for detection and/or diagnosis of SARS-CoV-2 by FDA under an Emergency Use Authorization (EUA). This EUA will remain  in effect (meaning this test can be used) for the duration of the COVID-19 declaration under Section 564(b)(1) of the Act, 21 U.S.C.section 360bbb-3(b)(1), unless the authorization is terminated  or revoked sooner.       Influenza A by PCR 10/07/2021 NEGATIVE  NEGATIVE Final   Influenza B by PCR 10/07/2021 NEGATIVE  NEGATIVE Final   Comment: (NOTE) The Xpert Xpress SARS-CoV-2/FLU/RSV plus assay is intended as an aid in the diagnosis of influenza from Nasopharyngeal swab specimens and should not be used as a sole basis for treatment. Nasal washings and aspirates are unacceptable for Xpert Xpress SARS-CoV-2/FLU/RSV testing.  Fact Sheet for Patients: EntrepreneurPulse.com.au  Fact Sheet for Healthcare Providers: IncredibleEmployment.be  This test is not yet approved or cleared by the [place] FDA and has been authorized for detection and/or diagnosis of SARS-CoV-2 by FDA under an Emergency Use Authorization (EUA). This EUA will remain in effect (meaning this test can be used) for the duration of the COVID-19 declaration under Section 564(b)(1) of the Act, 21 U.S.C. section 360bbb-3(b)(1), unless the authorization is terminated or revoked.  Performed at [facility], [place] [address], [place], [place] [address]     Blood Alcohol level:  Lab Results  Component Value Date   ETH 201 (H) 01/24/2022   ETH 285 (H) 41/32/4401    Metabolic Disorder Labs: Lab Results  Component Value Date   HGBA1C 4.7 (L) 01/24/2022   MPG 88.19 01/24/2022   MPG 102.54 01/20/2021    Lab Results  Component Value Date   PROLACTIN 28.7 (H) 05/04/2017   Lab Results  Component Value Date   CHOL 137 01/24/2022   TRIG 189 (H) 01/24/2022   HDL 32 (L) 01/24/2022   CHOLHDL 4.3 01/24/2022   VLDL 38 01/24/2022   LDLCALC 67 01/24/2022   LDLCALC 83 01/20/2021    Therapeutic Lab Levels: No results found for: LITHIUM No results found for: VALPROATE No components found for:  CBMZ  Physical Findings   AIMS    Flowsheet Row Admission (Discharged) from 04/19/2019 in [facility] Admission (Discharged) from 05/02/2017 in [facility]  AIMS Total Score 0 0      AUDIT    Flowsheet Row Admission (Discharged) from 04/19/2019 in [facility] Admission (Discharged) from 05/02/2017 in [facility] Admission (Discharged) from 12/22/2014 in [facility] ED from 11/05/2014 in [facility]PT Admission (Discharged) from 02/02/2014 in [facility]  Alcohol Use Disorder Identification Test Final Score (AUDIT) 34 34 29 35 38      GAD-7    Flowsheet Row Video Visit from 11/09/2021 in [facility] Video Visit from 07/08/2021 in [facility] Video Visit from 05/13/2021 in [facility] Video Visit from 04/02/2021 in [facility] Video Visit from 02/17/2021 in [facility]  Total GAD-7 Score '16 18 16 15 11      '$ PHQ2-9    Flowsheet Row Video Visit from 11/09/2021 in [facility] ED from 10/06/2021 in [place] DEPT Video Visit from 07/08/2021 in [facility] Video Visit from 05/13/2021 in [facility] Video Visit from  04/02/2021 in [facility]  PHQ-2 Total Score '2 4 2 2 3  '$ PHQ-9 Total Score '16 15 9 10 15      '$ [place] [facility] from 01/24/2022 in [facility] ED from 01/01/2022 in [facility] Video Visit from 11/09/2021 in [facility] No Risk No Risk No Risk        Musculoskeletal  Strength & Muscle Tone: within normal limits Gait & Station: normal Patient leans: N/A  Psychiatric Specialty Exam  Presentation  General Appearance: Appropriate for Environment; Casual  Eye Contact:Fair  Speech:Clear and Coherent; Normal Rate  Speech Volume:Normal  Handedness:Right   Mood and Affect  Mood:-- ("i'm fine")  Affect:Appropriate; Congruent   Thought Process  Thought Processes:Coherent; Goal Directed; Linear  Descriptions of Associations:Intact  Orientation:Full (Time, Place and Person)  Thought Content:WDL; Logical  Diagnosis of Schizophrenia or Schizoaffective disorder in past: No    Hallucinations:Hallucinations: None  Ideas of Reference:None  Suicidal Thoughts:Suicidal Thoughts: No  Homicidal Thoughts:Homicidal Thoughts: No   Sensorium  Memory:Immediate Good; Recent Fair; Remote Fair  Judgment:Fair  Insight:Fair   Executive Functions  Concentration:Good  Attention Span:Good  [place] of Knowledge:Good  Language:Good   Psychomotor Activity  Psychomotor Activity:Psychomotor Activity: Normal   Assets  Assets:Communication Skills; Desire for Improvement; Resilience   Sleep  Sleep:Sleep: Fair   Nutritional Assessment (For OBS and FBC admissions only) Has the patient had a weight loss or gain of 10 pounds or more in the last 3 months?: No Has the patient had a decrease in food intake/or appetite?: No Does the patient have dental problems?: No Does the patient have eating habits or behaviors that may be indicators of  an eating disorder including binging or inducing vomiting?: No Has the patient recently lost weight without trying?: 0 Has the patient been eating poorly because of a decreased appetite?: 0 Malnutrition Screening Tool Score: 0    Physical Exam  Physical Exam Constitutional:      Appearance: Normal appearance. She is normal weight.  HENT:     Head: Normocephalic and atraumatic.  Pulmonary:     Effort: Pulmonary effort is normal.  Neurological:     Mental Status: She is alert and oriented to person, place, and time.   Review of Systems  Constitutional:  Negative for chills and fever.  HENT:  Negative for hearing loss.   Eyes:  Negative for discharge and redness.  Respiratory:  Negative for cough.   Cardiovascular:  Negative for chest pain.  Gastrointestinal:  Positive for nausea. Negative for abdominal pain and vomiting.  Musculoskeletal:  Negative for myalgias.  Neurological:  Negative for  headaches.  Psychiatric/Behavioral:  Positive for depression and substance abuse. Negative for hallucinations and suicidal ideas.   Blood pressure 117/77, pulse 91, temperature 98.5 F (36.9 C), temperature source Oral, resp. rate 14, SpO2 92 %. There is no height or weight on file to calculate BMI.  Treatment Plan Summary: Sara [name] is a 46 yo female w PMH significant for bipolar disorder, anxiety, PTSD, misuse of benzodiazepines,and alcohol use disorder who was admitted to the [facility] on 01/24/2022 requesting alcohol treatment. Etoh 201; UDS +bzd.   Patient started on Ativan taper overnight; patient has been tolerating well -reports nausea this morning although has been able to eat breakfast without issue.  She denies SI/HI/AVH. She does not present actively manic/psychotic- will continue her home medications for bipolar disorder at this time.  Patient found to be hypokalemic on presentation (K2.9); after receiving 40 mEq x 2 doses potassium has improved to 4.0.  Patient remains appropriate for  continued treatment at the [facility] for alcohol detox and continued crisis stabilization.   Substance induced mood disorder Alcohol Use Disorder, severe Hx of Complicated Withdrawals -continue CIWA protocol -continue ativan detox- 1 mg QID --> 1 mg TID--> 1 mg BID--> 1 mg daily -multivitamin -thiamine -continue Gabapentin '200mg'$  TID   Bipolar Disorder -Continue home Seroquel '100mg'$  -Continue home Topamax 100 BID -Continue home Lexapro 40 mg daily  Hypokalemia, resolved -2.9 K on presentation (01/24/2022) -received 40 meq x 2 doses with repeat K 4.0 (01/25/2022)  Dispo: Ongoing.  Social work Sports administrator.  Potential transfer to alcohol treatment facility after completion of Ativan taper (ends Saturday morning); however, upcoming court date next week is potential barrier-patient attempting to contact lawyer to postpone court date   Ival Bible, MD 01/25/2022 12:07 PM

## 2022-01-25 NOTE — ED Notes (Signed)
Pt. Asked for scrubs (only had the top no pants) gave Pt. Bag of bath stuff to take a shower.  ?

## 2022-01-25 NOTE — ED Notes (Signed)
Reports prn Zofran helped ease nausea. ?

## 2022-01-25 NOTE — ED Notes (Signed)
Pt is in the bed sleeping. Respirations are even and unlabored. No acute distress noted. Will continue to monitor for safety. 

## 2022-01-25 NOTE — Clinical Social Work Psych Note (Signed)
LCSW Initial Note ? ?LCSW met with Delta to discuss treatment and potential discharge planning. Raylin was last seen in the [facility] on 10/07/21-10/11/21 for alcohol use with alcohol-induced mood disorder. ? ?Victorine presented with a dysphoric affect, depressed mood and remained lying in bed while speaking with providers. Briteny denied having any SI, HI or AVH at this time. She endorsed having nausea and "not feeling good" at this time.  ? ?During this encounter, Laurenashley presented to the [facility] seeking treatment for alcohol use and increasing withdrawal symptoms. Avalie reports that she relapsed shortly after returning home.  ? ?Jenniferann endorsed drinking a fifth of vodka on a daily basis. Karishma shared that she "cannot stop drinking" and has struggled with her withdrawal symptoms. ? ?Hopie shared that her relationship with her husband continues to be strained, and unfortunately they have decided to get separated, and eventually get a divorce. Myrka reports her husband and her son plan on moving to [place], however she does not plan to go with them. She reports her relationship with her children continues to be strained due to her ongoing alcohol use and mental health issues.  ? ?Takhia shared that she is currently homeless, however she is now motivated to participate in a residential substance abuse treatment program following her detox services at the [facility]. Elyzabeth has been started on an Ativan taper, that is scheduled to end Saturday, 01/29/22.  ? ?Ashyia has an upcoming court date, on Wednesday,  02/02/22. LCSW informed Margia that her court date would be a barrier for potential placement and recommended Shantil contact her attorney to determine if her court date could be "Delayed" for treatment. LCSW provided Achsah with her reported attorney's information. Raylen ensured LCSW she would contact her attorney. It is unclear whether the patient can return to her family's home temporarily at discharge.  ? ?LCSW will continue to follow and  assess for potential placement.  ? ? ? ?Radonna Ricker, MSW, LCSW ?Clinical Education officer, museum Insurance claims handler) ?[facility]  ? ?

## 2022-01-25 NOTE — ED Notes (Signed)
Patient attended group the title was Mindfullness, we discuss goals, accomplishments and who can you turn to for help, we also discussed awareness and acceptance of ones self others. The benefits of Mindfulness, reduced depression and anxiety, imporoved focus and memory, going over the same thing in your mind over and over. Managing your emotions and meditation along with worksheets. ?

## 2022-01-25 NOTE — ED Notes (Signed)
Admitted to [facility] endorsing  depression and ETOH abuse. Patient was cooperative during the admission assessment. Skin assessment complete. Belongings in the locker. Patient oriented to unit and unit rules. Meal and drinks offered to patient.  Patient verbalized agreement to treatment plans. Patient verbally contracts for safety while hospitalized. Will monitor for safety.  ?

## 2022-01-25 NOTE — ED Notes (Signed)
Provider advised that pt should be encouraged to drink more fluids due to the low BP. Will continue to monitor for safety. ?

## 2022-01-26 DIAGNOSIS — F419 Anxiety disorder, unspecified: Secondary | ICD-10-CM | POA: Diagnosis not present

## 2022-01-26 DIAGNOSIS — F319 Bipolar disorder, unspecified: Secondary | ICD-10-CM | POA: Diagnosis not present

## 2022-01-26 DIAGNOSIS — Z20822 Contact with and (suspected) exposure to covid-19: Secondary | ICD-10-CM | POA: Diagnosis not present

## 2022-01-26 DIAGNOSIS — F1020 Alcohol dependence, uncomplicated: Secondary | ICD-10-CM | POA: Diagnosis not present

## 2022-01-26 NOTE — ED Notes (Signed)
Pt provided lunch and ate about 75%.  No complaints of pain or discomfort at this time.  Sitting in dayroom with other pts watching tv.  Will continue to monitor for safety. ?

## 2022-01-26 NOTE — ED Notes (Signed)
Reports poor sleep over the night but that her mood is better today.  When speaking with this Probation officer pt is pleasant.  Denies SI, HI, and AVH at this time.  Pt did report she saw someone standing in her doorway last night and was not sure if that was a visual hallucination.  Informed pt that nursing staff tends to peak in doorways throughout the day and it is possible she saw a staff member.  Told pt if she see anything in the future to let staff know.  Denies pain at this time.  Breathing is even and unlabored.  Will continue to monitor for safety. ?

## 2022-01-26 NOTE — ED Notes (Signed)
Patient denies Sara [name].  Patient is cooperative and interacts well with staff. Respiratory is even and unlabored. No distress noted. Patient is in the dinning room watching TV at present. Will continue to monitor for safety.  ?

## 2022-01-26 NOTE — ED Notes (Signed)
Pt is in the bed sleeping. Respirations are even and unlabored. No acute distress noted. Will continue to monitor for safety. 

## 2022-01-26 NOTE — ED Notes (Signed)
Patient expressed in wrap up group that her goal for the day was to drink more water and do what she has to do to get back to her family. Staff tal;ked with patient on ways to come up with positive coping skills when feeling like she wants to drink. Staff commended patient for sharing her feelings and interacting and communicating in a positive manner during group. ?

## 2022-01-26 NOTE — Clinical Social Work Psych Note (Signed)
LCSW Update  ? ? ?Cyanne reports feeling "better" today. Anniya denied having any SI,HI or AVH.  ? ? ?She shared that she continues to be interested in residential treatment services following her discharge. Awilda reports she attempted to contact her attorney, Volney Presser, however did not get an answer and left a voicemail.  ? ?Wilhelmina reports that she will continue to attempt to call her attorney to have her upcoming court date delayed.  ? ?LCSW will continue to follow.  ? ? ?Radonna Ricker, MSW, LCSW ?Clinical Education officer, museum Insurance claims handler) ?[facility]  ? ?

## 2022-01-26 NOTE — ED Notes (Signed)
Unit number provided to pt to give to lawyer should he need to contact pt. ?

## 2022-01-26 NOTE — ED Notes (Signed)
Pt placed call to lawyer Sara [name] [phone]) to see if she could have her up coming court date moved.  Court date is on 02/02/2022.  Ms. Sara [name] stated she would need a letter from this facility explaining why she is here.  Fax number is [phone]).  Sara [name] from [facility] [phone] ext [phone]) called and said she would have a bed available for pt on Monday 3/13.  The pt is to arrive prior to 0900 and have sample medications with refills.  Provider notified. ?

## 2022-01-26 NOTE — ED Provider Notes (Signed)
Behavioral Health Progress Note  Date and Time: 01/26/2022 1:39 PM Name: Sara [name] MRN:  [id number]  Subjective:   Sara [name] is a 46 yo female w PMH significant for bipolar disorder, anxiety, PTSD, misuse of benzodiazepines,and alcohol use disorder who was admitted to the [facility] on 01/24/2022 requesting alcohol treatment. Etoh 201; UDS +bzd.   Patient seen and chart reviewed-patient has been medication compliant and has been appropriate with staff and peers on the unit; most recent CIWA 0.  Patient tolerating Ativan taper well.Patient states that her mood is "okay" today.  She denies SI/HI/AVH.  She reports feeling nauseous this morning although denies vomiting.  She states that she was able to eat breakfast this morning .   She denies alcohol withdrawal symptoms of vomiting, GI upset, tremors, diaphoresis.  She reports sleeping well yesterday and denies all other physical complaints.  Patient is advised to call her attorney in regards to moving her court date if possible so that she may attend treatment.  Patient verbalizes understanding and is in agreement.  Patient also requests information about temporary job agencies-discussed with patient that I will notify LCSW.   Diagnosis:  Final diagnoses:  Bipolar 1 disorder ([place])  Alcohol use disorder, severe, dependence ([place])    Total Time spent with patient: 15 minutes  Past Psychiatric History: AUD, severe; bipolar disorder; mdd; SIMD Past Medical History:  Past Medical History:  Diagnosis Date   Alcohol abuse    Alcoholism ([place])    Anemia    Anxiety    Blood transfusion without reported diagnosis    Cirrhosis ([place])    Depression    Esophageal varices with bleeding(456.0) 06/13/2014   GERD (gastroesophageal reflux disease)    Heart murmur    Patient states she may have   Menorrhagia    Pancytopenia ([place]) 01/15/2014   Pneumonia    Portal hypertension ([place])    S/P alcohol detoxification    2-3 days at behavioral health  previously   UGI bleed 06/12/2014    Past Surgical History:  Procedure Laterality Date   CHOLECYSTECTOMY     ESOPHAGOGASTRODUODENOSCOPY N/A 06/12/2014   Procedure: ESOPHAGOGASTRODUODENOSCOPY (EGD);  Surgeon: Gatha Mayer, MD;  Location: [facility];  Service: Endoscopy;  Laterality: N/A;   ESOPHAGOGASTRODUODENOSCOPY (EGD) WITH PROPOFOL N/A 07/29/2014   Procedure: ESOPHAGOGASTRODUODENOSCOPY (EGD) WITH PROPOFOL;  Surgeon: Inda Castle, MD;  Location: [facility];  Service: Endoscopy;  Laterality: N/A;   ESOPHAGOGASTRODUODENOSCOPY (EGD) WITH PROPOFOL N/A 01/20/2018   Procedure: ESOPHAGOGASTRODUODENOSCOPY (EGD) WITH PROPOFOL;  Surgeon: Mauri Pole, MD;  Location: [facility];  Service: Endoscopy;  Laterality: N/A;   Family History:  Family History  Problem Relation Age of Onset   Colon polyps Mother    Hypertension Mother    Thyroid disease Mother    Alcoholism Mother    Alcoholism Father    Alcohol abuse Maternal Grandfather    Alcohol abuse Paternal Grandfather    Alcohol abuse Paternal Aunt    Alcohol abuse Maternal Uncle    Family Psychiatric  History:  per chart, mother, father, MGF and PGF, aunt and uncle with alcohol abuse Social History:  Social History   Substance and Sexual Activity  Alcohol Use Yes   Alcohol/week: 0.0 standard drinks   Comment: Usually drinks 2-3 bottles of wine daily when drinking.      Social History   Substance and Sexual Activity  Drug Use No   Types: Cocaine, Marijuana   Comment: denies    Social History  Socioeconomic History   Marital status: Married    Spouse name: [name]   Number of children: 2   Years of education: Associates   Highest education level: Not on file  Occupational History   Occupation: Paralegal  Tobacco Use   Smoking status: Former    Packs/day: 0.25    Types: Cigarettes    Quit date: 11/22/2018    Years since quitting: 3.1   Smokeless tobacco: Never  Substance and Sexual Activity   Alcohol use:  Yes    Alcohol/week: 0.0 standard drinks    Comment: Usually drinks 2-3 bottles of wine daily when drinking.    Drug use: No    Types: Cocaine, Marijuana    Comment: denies   Sexual activity: Never    Birth control/protection: None  Other Topics Concern   Not on file  Social History Narrative   Lives with husband and son. Daughter lives nearby. 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   Social Determinants of Health   Financial Resource Strain: Not on file  Food Insecurity: Not on file  Transportation Needs: Not on file  Physical Activity: Not on file  Stress: Not on file  Social Connections: Not on file   SDOH:  SDOH Screenings   Alcohol Screen: Not on file  Depression (PHQ2-9): Medium Risk   PHQ-2 Score: 16  Financial Resource Strain: Not on file  Food Insecurity: Not on file  Housing: Not on file  Physical Activity: Not on file  Social Connections: Not on file  Stress: Not on file  Tobacco Use: Medium Risk   Smoking Tobacco Use: Former   Smokeless Tobacco Use: Never   Passive Exposure: Not on file  Transportation Needs: Not on file   Additional Social History:    Pain Medications: See MAR Prescriptions: See MAR Over the Counter: See MAR History of alcohol / drug use?: Yes Longest period of sobriety (when/how long): 9 months in 2017 Negative Consequences of Use: Personal relationships, Work / Youth worker Withdrawal Symptoms: DTs, Sweats, Tremors, Blackouts, Agitation, Irritability Name of Substance 1: Alcohol 1 - Age of First Use: 25 1 - Amount (size/oz): Approximately 1 fifth of vodka 1 - Frequency: Daily 1 - Duration: Ongoing 1 - Last Use / Amount: 01/24/2022 1 - Method of Aquiring: Store 1- Route of Use: Oral ingestion                  Sleep: Fair  Appetite:   decreased 2/2 nausea  Current Medications:  Current Facility-Administered Medications  Medication Dose Route Frequency Provider Last Rate Last Admin   escitalopram  (LEXAPRO) tablet 40 mg  40 mg Oral Daily Evette Georges, NP   40 mg at 01/26/22 6270   gabapentin (NEURONTIN) capsule 200 mg  200 mg Oral TID Evette Georges, NP   200 mg at 01/26/22 3500   hydrOXYzine (ATARAX) tablet 25 mg  25 mg Oral TID PRN Evette Georges, NP   25 mg at 01/24/22 2301   loperamide (IMODIUM) capsule 2-4 mg  2-4 mg Oral PRN Evette Georges, NP       LORazepam (ATIVAN) tablet 1 mg  1 mg Oral Q6H PRN Evette Georges, NP       LORazepam (ATIVAN) tablet 1 mg  1 mg Oral TID Evette Georges, NP       Followed by   Derrill Memo ON 01/27/2022] LORazepam (ATIVAN) tablet 1 mg  1 mg Oral BID Evette Georges, NP       Followed by   [  START ON 01/29/2022] LORazepam (ATIVAN) tablet 1 mg  1 mg Oral Daily Evette Georges, NP       multivitamin with minerals tablet 1 tablet  1 tablet Oral Daily Evette Georges, NP   1 tablet at 01/26/22 0921   ondansetron (ZOFRAN-ODT) disintegrating tablet 4 mg  4 mg Oral Q6H PRN Evette Georges, NP   4 mg at 01/26/22 1325   thiamine tablet 100 mg  100 mg Oral Daily Evette Georges, NP   100 mg at 01/26/22 4332   topiramate (TOPAMAX) tablet 100 mg  100 mg Oral BID Evette Georges, NP   100 mg at 01/26/22 9518   Current Outpatient Medications  Medication Sig Dispense Refill   escitalopram (LEXAPRO) 20 MG tablet Take 2 tablets (40 mg total) by mouth daily. 60 tablet 1   gabapentin (NEURONTIN) 100 MG capsule Take 2 capsules (200 mg total) by mouth 3 (three) times daily. 180 capsule 1   Multiple Vitamin (MULTIVITAMIN WITH MINERALS) TABS tablet Take 1 tablet by mouth daily.     QUEtiapine (SEROQUEL) 100 MG tablet Take 1 tablet (100 mg total) by mouth at bedtime. 30 tablet 1   topiramate (TOPAMAX) 100 MG tablet Take 1 tablet (100 mg total) by mouth 2 (two) times daily. 60 tablet 1    Labs  Lab Results:  Admission on 01/24/2022  Component Date Value Ref Range Status   SARS Coronavirus 2 by RT PCR 01/24/2022 NEGATIVE  NEGATIVE Final   Comment: (NOTE) SARS-CoV-2 target nucleic acids are NOT  DETECTED.  The SARS-CoV-2 RNA is generally detectable in upper respiratory specimens during the acute phase of infection. The lowest concentration of SARS-CoV-2 viral copies this assay can detect is 138 copies/mL. A negative result does not preclude SARS-Cov-2 infection and should not be used as the sole basis for treatment or other patient management decisions. A negative result may occur with  improper specimen collection/handling, submission of specimen other than nasopharyngeal swab, presence of viral mutation(s) within the areas targeted by this assay, and inadequate number of viral copies(<138 copies/mL). A negative result must be combined with clinical observations, patient history, and epidemiological information. The expected result is Negative.  Fact Sheet for Patients:  EntrepreneurPulse.com.au  Fact Sheet for Healthcare Providers:  IncredibleEmployment.be  This test is no                          t yet approved or cleared by the [place] FDA and  has been authorized for detection and/or diagnosis of SARS-CoV-2 by FDA under an Emergency Use Authorization (EUA). This EUA will remain  in effect (meaning this test can be used) for the duration of the COVID-19 declaration under Section 564(b)(1) of the Act, 21 U.S.C.section 360bbb-3(b)(1), unless the authorization is terminated  or revoked sooner.       Influenza A by PCR 01/24/2022 NEGATIVE  NEGATIVE Final   Influenza B by PCR 01/24/2022 NEGATIVE  NEGATIVE Final   Comment: (NOTE) The Xpert Xpress SARS-CoV-2/FLU/RSV plus assay is intended as an aid in the diagnosis of influenza from Nasopharyngeal swab specimens and should not be used as a sole basis for treatment. Nasal washings and aspirates are unacceptable for Xpert Xpress SARS-CoV-2/FLU/RSV testing.  Fact Sheet for Patients: EntrepreneurPulse.com.au  Fact Sheet for Healthcare  Providers: IncredibleEmployment.be  This test is not yet approved or cleared by the [place] FDA and has been authorized for detection and/or diagnosis of SARS-CoV-2 by FDA under an Emergency Use  Authorization (EUA). This EUA will remain in effect (meaning this test can be used) for the duration of the COVID-19 declaration under Section 564(b)(1) of the Act, 21 U.S.C. section 360bbb-3(b)(1), unless the authorization is terminated or revoked.  Performed at [facility], [place] [address] [place], [place] [address]    SARS Coronavirus 2 Ag 01/24/2022 Negative  Negative Preliminary   WBC 01/24/2022 4.0  4.0 - 10.5 K/uL Final   RBC 01/24/2022 3.79 (L)  3.87 - 5.11 MIL/uL Final   Hemoglobin 01/24/2022 11.4 (L)  12.0 - 15.0 g/dL Final   HCT 01/24/2022 34.4 (L)  36.0 - 46.0 % Final   MCV 01/24/2022 90.8  80.0 - 100.0 fL Final   MCH 01/24/2022 30.1  26.0 - 34.0 pg Final   MCHC 01/24/2022 33.1  30.0 - 36.0 g/dL Final   RDW 01/24/2022 16.0 (H)  11.5 - 15.5 % Final   Platelets 01/24/2022 99 (L)  150 - 400 K/uL Final   Comment: Immature Platelet Fraction may be clinically indicated, consider ordering this additional test [id number] CONSISTENT WITH PREVIOUS RESULT REPEATED TO VERIFY    nRBC 01/24/2022 0.0  0.0 - 0.2 % Final   Neutrophils Relative % 01/24/2022 49  % Final   Neutro Abs 01/24/2022 2.0  1.7 - 7.7 K/uL Final   Lymphocytes Relative 01/24/2022 40  % Final   Lymphs Abs 01/24/2022 1.6  0.7 - 4.0 K/uL Final   Monocytes Relative 01/24/2022 9  % Final   Monocytes Absolute 01/24/2022 0.4  0.1 - 1.0 K/uL Final   Eosinophils Relative 01/24/2022 1  % Final   Eosinophils Absolute 01/24/2022 0.1  0.0 - 0.5 K/uL Final   Basophils Relative 01/24/2022 1  % Final   Basophils Absolute 01/24/2022 0.0  0.0 - 0.1 K/uL Final   Immature Granulocytes 01/24/2022 0  % Final   Abs Immature Granulocytes 01/24/2022 0.01  0.00 - 0.07 K/uL Final   Performed at [facility], [place] [address] [place], [place] [address]   Sodium 01/24/2022 136  135 - 145 mmol/L Final   Potassium 01/24/2022 2.8 (L)  3.5 - 5.1 mmol/L Final   Chloride 01/24/2022 109  98 - 111 mmol/L Final   CO2 01/24/2022 17 (L)  22 - 32 mmol/L Final   Glucose, Bld 01/24/2022 90  70 - 99 mg/dL Final   Glucose reference range applies only to samples taken after fasting for at least 8 hours.   BUN 01/24/2022 6  6 - 20 mg/dL Final   Creatinine, Ser 01/24/2022 0.68  0.44 - 1.00 mg/dL Final   Calcium 01/24/2022 8.2 (L)  8.9 - 10.3 mg/dL Final   Total Protein 01/24/2022 7.0  6.5 - 8.1 g/dL Final   Albumin 01/24/2022 3.3 (L)  3.5 - 5.0 g/dL Final   AST 01/24/2022 36  15 - 41 U/L Final   ALT 01/24/2022 22  0 - 44 U/L Final   Alkaline Phosphatase 01/24/2022 77  38 - 126 U/L Final   Total Bilirubin 01/24/2022 0.9  0.3 - 1.2 mg/dL Final   GFR, Estimated 01/24/2022 >60  >60 mL/min Final   Comment: (NOTE) Calculated using the CKD-EPI Creatinine Equation (2021)    Anion gap 01/24/2022 10  5 - 15 Final   Performed at [facility], [place] [address], [place], [place] [address]   Hgb A1c MFr Bld 01/24/2022 4.7 (L)  4.8 - 5.6 % Final   Comment: (NOTE) Pre diabetes:  5.7%-6.4%  Diabetes:              >6.4%  Glycemic control for   <7.0% adults with diabetes    Mean Plasma Glucose 01/24/2022 88.19  mg/dL Final   Performed at [facility] [address] [place], [place] [address]   Alcohol, Ethyl (B) 01/24/2022 201 (H)  <10 mg/dL Final   Comment: (NOTE) Lowest detectable limit for serum alcohol is 10 mg/dL.  For medical purposes only. Performed at [facility], [place] [address], [place], [place] [address]    TSH 01/24/2022 3.012  0.350 - 4.500 uIU/mL Final   Comment: Performed by a 3rd Generation assay with a functional sensitivity of <=0.01 uIU/mL. Performed at [facility], [place] [address] [place] [address] [place], [place] [address]    Color, Urine 01/24/2022 YELLOW  YELLOW Final    APPearance 01/24/2022 CLEAR  CLEAR Final   Specific Gravity, Urine 01/24/2022 1.006  1.005 - 1.030 Final   pH 01/24/2022 7.0  5.0 - 8.0 Final   Glucose, UA 01/24/2022 NEGATIVE  NEGATIVE mg/dL Final   Hgb urine dipstick 01/24/2022 NEGATIVE  NEGATIVE Final   Bilirubin Urine 01/24/2022 NEGATIVE  NEGATIVE Final   Ketones, ur 01/24/2022 NEGATIVE  NEGATIVE mg/dL Final   Protein, ur 01/24/2022 NEGATIVE  NEGATIVE mg/dL Final   Nitrite 01/24/2022 NEGATIVE  NEGATIVE Final   Leukocytes,Ua 01/24/2022 NEGATIVE  NEGATIVE Final   Performed at [facility], [place] [address] [place], [place] [address]   Preg Test, Ur 01/24/2022 NEGATIVE  NEGATIVE Final   Comment:        THE SENSITIVITY OF THIS METHODOLOGY IS >20 mIU/mL. Performed at [facility], [place] [address] [place], [place] [address]    POC Amphetamine UR 01/24/2022 None Detected  NONE DETECTED (Cut Off Level 1000 ng/mL) Final   POC Secobarbital (BAR) 01/24/2022 None Detected  NONE DETECTED (Cut Off Level 300 ng/mL) Final   POC Buprenorphine (BUP) 01/24/2022 None Detected  NONE DETECTED (Cut Off Level 10 ng/mL) Final   POC Oxazepam (BZO) 01/24/2022 Positive (A)  NONE DETECTED (Cut Off Level 300 ng/mL) Final   POC Cocaine UR 01/24/2022 None Detected  NONE DETECTED (Cut Off Level 300 ng/mL) Final   POC Methamphetamine UR 01/24/2022 None Detected  NONE DETECTED (Cut Off Level 1000 ng/mL) Final   POC Morphine 01/24/2022 None Detected  NONE DETECTED (Cut Off Level 300 ng/mL) Final   POC Oxycodone UR 01/24/2022 None Detected  NONE DETECTED (Cut Off Level 100 ng/mL) Final   POC Methadone UR 01/24/2022 None Detected  NONE DETECTED (Cut Off Level 300 ng/mL) Final   POC Marijuana UR 01/24/2022 None Detected  NONE DETECTED (Cut Off Level 50 ng/mL) Final   Preg Test, Ur 01/24/2022 NEGATIVE  NEGATIVE Final   Comment:        THE SENSITIVITY OF THIS METHODOLOGY IS >24 mIU/mL    SARSCOV2ONAVIRUS 2 AG 01/24/2022 NEGATIVE  NEGATIVE Final   Comment:  (NOTE) SARS-CoV-2 antigen NOT DETECTED.   Negative results are presumptive.  Negative results do not preclude SARS-CoV-2 infection and should not be used as the sole basis for treatment or other patient management decisions, including infection  control decisions, particularly in the presence of clinical signs and  symptoms consistent with COVID-19, or in those who have been in contact with the virus.  Negative results must be combined with clinical observations, patient history, and epidemiological information. The expected result is Negative.  Fact Sheet for Patients:  HandmadeRecipes.com.cy  Fact Sheet for Healthcare Providers: FuneralLife.at  This test is not yet approved or cleared by the [place] FDA and  has been authorized for detection and/or diagnosis of SARS-CoV-2 by FDA under an Emergency Use Authorization (EUA).  This EUA will remain in effect (meaning this test can be used) for the duration of  the COV                          ID-19 declaration under Section 564(b)(1) of the Act, 21 U.S.C. section 360bbb-3(b)(1), unless the authorization is terminated or revoked sooner.     Cholesterol 01/24/2022 137  0 - 200 mg/dL Final   Triglycerides 01/24/2022 189 (H)  <150 mg/dL Final   HDL 01/24/2022 32 (L)  >40 mg/dL Final   Total CHOL/HDL Ratio 01/24/2022 4.3  RATIO Final   VLDL 01/24/2022 38  0 - 40 mg/dL Final   LDL Cholesterol 01/24/2022 67  0 - 99 mg/dL Final   Comment:        Total Cholesterol/HDL:CHD Risk Coronary Heart Disease Risk Table                     Men   Women  1/2 Average Risk   3.4   3.3  Average Risk       5.0   4.4  2 X Average Risk   9.6   7.1  3 X Average Risk  23.4   11.0        Use the calculated Patient Ratio above and the CHD Risk Table to determine the patient's CHD Risk.        ATP III CLASSIFICATION (LDL):  <100     mg/dL   Optimal  100-129  mg/dL   Near or Above                     Optimal  130-159  mg/dL   Borderline  160-189  mg/dL   High  >190     mg/dL   Very High Performed at [facility] [address] [place], [place] [address]    Sodium 01/25/2022 139  135 - 145 mmol/L Final   Potassium 01/25/2022 4.0  3.5 - 5.1 mmol/L Final   NO VISIBLE HEMOLYSIS   Chloride 01/25/2022 114 (H)  98 - 111 mmol/L Final   CO2 01/25/2022 19 (L)  22 - 32 mmol/L Final   Glucose, Bld 01/25/2022 83  70 - 99 mg/dL Final   Glucose reference range applies only to samples taken after fasting for at least 8 hours.   BUN 01/25/2022 7  6 - 20 mg/dL Final   Creatinine, Ser 01/25/2022 0.53  0.44 - 1.00 mg/dL Final   Calcium 01/25/2022 8.4 (L)  8.9 - 10.3 mg/dL Final   Total Protein 01/25/2022 6.7  6.5 - 8.1 g/dL Final   Albumin 01/25/2022 3.1 (L)  3.5 - 5.0 g/dL Final   AST 01/25/2022 33  15 - 41 U/L Final   ALT 01/25/2022 19  0 - 44 U/L Final   Alkaline Phosphatase 01/25/2022 70  38 - 126 U/L Final   Total Bilirubin 01/25/2022 0.8  0.3 - 1.2 mg/dL Final   GFR, Estimated 01/25/2022 >60  >60 mL/min Final   Comment: (NOTE) Calculated using the CKD-EPI Creatinine Equation (2021)    Anion gap 01/25/2022 6  5 - 15 Final   Performed at [facility]  [address], [place], [place] [address]  Admission on 10/07/2021, Discharged on 10/11/2021  Component Date Value Ref Range Status   POC Amphetamine UR 10/07/2021 None Detected  NONE DETECTED (Cut Off Level 1000 ng/mL) Final   POC Secobarbital (BAR) 10/07/2021 None Detected  NONE DETECTED (Cut Off Level 300 ng/mL) Final   POC Buprenorphine (BUP) 10/07/2021 None Detected  NONE DETECTED (Cut Off Level 10 ng/mL) Final   POC Oxazepam (BZO) 10/07/2021 Positive (A)  NONE DETECTED (Cut Off Level 300 ng/mL) Final   POC Cocaine UR 10/07/2021 None Detected  NONE DETECTED (Cut Off Level 300 ng/mL) Final   POC Methamphetamine UR 10/07/2021 None Detected  NONE DETECTED (Cut Off Level 1000 ng/mL) Final   POC Morphine 10/07/2021 None Detected   NONE DETECTED (Cut Off Level 300 ng/mL) Final   POC Oxycodone UR 10/07/2021 None Detected  NONE DETECTED (Cut Off Level 100 ng/mL) Final   POC Methadone UR 10/07/2021 None Detected  NONE DETECTED (Cut Off Level 300 ng/mL) Final   POC Marijuana UR 10/07/2021 None Detected  NONE DETECTED (Cut Off Level 50 ng/mL) Final  Admission on 10/06/2021, Discharged on 10/07/2021  Component Date Value Ref Range Status   Lipase 10/06/2021 93 (H)  11 - 51 U/L Final   Performed at [facility], [place] [address] [place], [place] [address]   Sodium 10/06/2021 140  135 - 145 mmol/L Final   Potassium 10/06/2021 3.3 (L)  3.5 - 5.1 mmol/L Final   Chloride 10/06/2021 110  98 - 111 mmol/L Final   CO2 10/06/2021 20 (L)  22 - 32 mmol/L Final   Glucose, Bld 10/06/2021 119 (H)  70 - 99 mg/dL Final   Glucose reference range applies only to samples taken after fasting for at least 8 hours.   BUN 10/06/2021 9  6 - 20 mg/dL Final   Creatinine, Ser 10/06/2021 0.52  0.44 - 1.00 mg/dL Final   Calcium 10/06/2021 8.4 (L)  8.9 - 10.3 mg/dL Final   Total Protein 10/06/2021 7.2  6.5 - 8.1 g/dL Final   Albumin 10/06/2021 3.1 (L)  3.5 - 5.0 g/dL Final   AST 10/06/2021 49 (H)  15 - 41 U/L Final   ALT 10/06/2021 24  0 - 44 U/L Final   Alkaline Phosphatase 10/06/2021 88  38 - 126 U/L Final   Total Bilirubin 10/06/2021 0.8  0.3 - 1.2 mg/dL Final   GFR, Estimated 10/06/2021 >60  >60 mL/min Final   Comment: (NOTE) Calculated using the CKD-EPI Creatinine Equation (2021)    Anion gap 10/06/2021 10  5 - 15 Final   Performed at [facility], [place] [address], [place], [place] [address]   WBC 10/06/2021 2.2 (L)  4.0 - 10.5 K/uL Final   RBC 10/06/2021 3.93  3.87 - 5.11 MIL/uL Final   Hemoglobin 10/06/2021 11.4 (L)  12.0 - 15.0 g/dL Final   HCT 10/06/2021 35.1 (L)  36.0 - 46.0 % Final   MCV 10/06/2021 89.3  80.0 - 100.0 fL Final   MCH 10/06/2021 29.0  26.0 - 34.0 pg Final   MCHC 10/06/2021 32.5  30.0 -  36.0 g/dL Final   RDW 10/06/2021 17.4 (H)  11.5 - 15.5 % Final   Platelets 10/06/2021 76 (L)  150 - 400 K/uL Final   Comment: SPECIMEN CHECKED FOR CLOTS Immature Platelet Fraction may be clinically indicated, consider ordering this additional test [id number] REPEATED TO VERIFY PLATELET COUNT CONFIRMED BY SMEAR    nRBC 10/06/2021 0.0  0.0 - 0.2 % Final   Performed at [facility], [place] [address], [place], [place] [address]   Color, Urine 10/07/2021 YELLOW  YELLOW Final   APPearance 10/07/2021 CLOUDY (A)  CLEAR Final   Specific Gravity, Urine 10/07/2021 1.011  1.005 - 1.030 Final   pH 10/07/2021 5.0  5.0 - 8.0 Final   Glucose, UA 10/07/2021 NEGATIVE  NEGATIVE mg/dL Final   Hgb urine dipstick 10/07/2021 SMALL (A)  NEGATIVE Final   Bilirubin Urine 10/07/2021 NEGATIVE  NEGATIVE Final   Ketones, ur 10/07/2021 5 (A)  NEGATIVE mg/dL Final   Protein, ur 10/07/2021 NEGATIVE  NEGATIVE mg/dL Final   Nitrite 10/07/2021 NEGATIVE  NEGATIVE Final   Leukocytes,Ua 10/07/2021 LARGE (A)  NEGATIVE Final   RBC / HPF 10/07/2021 6-10  0 - 5 RBC/hpf Final   WBC, UA 10/07/2021 11-20  0 - 5 WBC/hpf Final   Bacteria, UA 10/07/2021 MANY (A)  NONE SEEN Final   Squamous Epithelial / LPF 10/07/2021 11-20  0 - 5 Final   Mucus 10/07/2021 PRESENT   Final   Performed at [facility], [place] [address], [place], [place] [address]   I-stat hCG, quantitative 10/06/2021 <5.0  <5 mIU/mL Final   Comment 3 10/06/2021          Final   Comment:   GEST. AGE      CONC.  (mIU/mL)   <=1 WEEK        5 - 50     2 WEEKS       50 - 500     3 WEEKS       100 - 10,000     4 WEEKS     1,000 - 30,000        FEMALE AND NON-PREGNANT FEMALE:     LESS THAN 5 mIU/mL    Alcohol, Ethyl (B) 10/06/2021 285 (H)  <10 mg/dL Final   Comment: (NOTE) Lowest detectable limit for serum alcohol is 10 mg/dL.  For medical purposes only. Performed at [name], [place] [address], [place],  [place] [address]    SARS Coronavirus 2 by RT PCR 10/07/2021 NEGATIVE  NEGATIVE Final   Comment: (NOTE) SARS-CoV-2 target nucleic acids are NOT DETECTED.  The SARS-CoV-2 RNA is generally detectable in upper respiratory specimens during the acute phase of infection. The lowest concentration of SARS-CoV-2 viral copies this assay can detect is 138 copies/mL. A negative result does not preclude SARS-Cov-2 infection and should not be used as the sole basis for treatment or other patient management decisions. A negative result may occur with  improper specimen collection/handling, submission of specimen other than nasopharyngeal swab, presence of viral mutation(s) within the areas targeted by this assay, and inadequate number of viral copies(<138 copies/mL). A negative result must be combined with clinical observations, patient history, and epidemiological information. The expected result is Negative.  Fact Sheet for Patients:  EntrepreneurPulse.com.au  Fact Sheet for Healthcare Providers:  IncredibleEmployment.be  This test is no                          t yet approved or cleared by the [place] FDA and  has been authorized for detection and/or diagnosis of SARS-CoV-2 by FDA under an Emergency Use Authorization (EUA). This EUA will remain  in effect (meaning this test can be used) for the duration of the COVID-19 declaration under Section 564(b)(1) of the Act, 21 U.S.C.section 360bbb-3(b)(1), unless  the authorization is terminated  or revoked sooner.       Influenza A by PCR 10/07/2021 NEGATIVE  NEGATIVE Final   Influenza B by PCR 10/07/2021 NEGATIVE  NEGATIVE Final   Comment: (NOTE) The Xpert Xpress SARS-CoV-2/FLU/RSV plus assay is intended as an aid in the diagnosis of influenza from Nasopharyngeal swab specimens and should not be used as a sole basis for treatment. Nasal washings and aspirates are unacceptable for Xpert Xpress  SARS-CoV-2/FLU/RSV testing.  Fact Sheet for Patients: EntrepreneurPulse.com.au  Fact Sheet for Healthcare Providers: IncredibleEmployment.be  This test is not yet approved or cleared by the [place] FDA and has been authorized for detection and/or diagnosis of SARS-CoV-2 by FDA under an Emergency Use Authorization (EUA). This EUA will remain in effect (meaning this test can be used) for the duration of the COVID-19 declaration under Section 564(b)(1) of the Act, 21 U.S.C. section 360bbb-3(b)(1), unless the authorization is terminated or revoked.  Performed at [facility], [place] [address] [place], [place] [address]     Blood Alcohol level:  Lab Results  Component Value Date   ETH 201 (H) 01/24/2022   ETH 285 (H) 61/95/0932    Metabolic Disorder Labs: Lab Results  Component Value Date   HGBA1C 4.7 (L) 01/24/2022   MPG 88.19 01/24/2022   MPG 102.54 01/20/2021   Lab Results  Component Value Date   PROLACTIN 28.7 (H) 05/04/2017   Lab Results  Component Value Date   CHOL 137 01/24/2022   TRIG 189 (H) 01/24/2022   HDL 32 (L) 01/24/2022   CHOLHDL 4.3 01/24/2022   VLDL 38 01/24/2022   LDLCALC 67 01/24/2022   LDLCALC 83 01/20/2021    Therapeutic Lab Levels: No results found for: LITHIUM No results found for: VALPROATE No components found for:  CBMZ  Physical Findings   AIMS    Flowsheet Row Admission (Discharged) from 04/19/2019 in [place] [facility] Admission (Discharged) from 05/02/2017 in [facility]  AIMS Total Score 0 0      AUDIT    Flowsheet Row Admission (Discharged) from 04/19/2019 in [facility] [facility] Admission (Discharged) from 05/02/2017 in [place] [facility] Admission (Discharged) from 12/22/2014 in [facility] [facility] ED from 11/05/2014 in [place] [facility]PT Admission (Discharged) from 02/02/2014 in [place] [facility]  Alcohol Use Disorder Identification Test Final Score (AUDIT) 34 34 29 35 38      GAD-7    Flowsheet Row Video Visit from 11/09/2021 in [facility] Video Visit from 07/08/2021 in [facility] [place] Video Visit from 05/13/2021 in [facility] Video Visit from 04/02/2021 in [facility] Video Visit from 02/17/2021 in [facility]  Total GAD-7 Score '16 18 16 15 11      '$ PHQ2-9    Flowsheet Row Video Visit from 11/09/2021 in [facility] ED from 10/06/2021 in [place] DEPT Video Visit from 07/08/2021 in [facility] Video Visit from 05/13/2021 in [facility] Video Visit from 04/02/2021 in [facility]  PHQ-2 Total Score '2 4 2 2 3  '$ PHQ-9 Total Score '16 15 9 10 15      '$ Flowsheet Row ED from 01/24/2022 in [facility] ED from 01/01/2022 in [facility] Video Visit from 11/09/2021 in [place] No Risk No Risk No Risk        Musculoskeletal  Strength & Muscle Tone: within normal limits Gait & Station: normal Patient leans: N/A  Psychiatric Specialty Exam  Presentation  General Appearance: Appropriate for Environment; Casual  Eye Contact:Fair  Speech:Clear and Coherent; Normal Rate  Speech Volume:Normal  Handedness:Right   Mood and Affect  Mood:-- ("ok")  Affect:Appropriate; Congruent   Thought Process  Thought Processes:Coherent; Goal Directed; Linear  Descriptions of Associations:Intact  Orientation:Full (Time, Place and Person)  Thought Content:WDL; Logical  Diagnosis of  Schizophrenia or Schizoaffective disorder in past: No    Hallucinations:Hallucinations: None  Ideas of Reference:None  Suicidal Thoughts:Suicidal Thoughts: No  Homicidal Thoughts:Homicidal Thoughts: No   Sensorium  Memory:Immediate Good; Recent Good; Remote Good  Judgment:Fair  Insight:Fair   Executive Functions  Concentration:Good  Attention Span:Good  [place] of Knowledge:Good  Language:Good   Psychomotor Activity  Psychomotor Activity:Psychomotor Activity: Normal   Assets  Assets:Communication Skills; Desire for Improvement; Resilience   Sleep  Sleep:Sleep: Fair   No data recorded   Physical Exam  Physical Exam Constitutional:      Appearance: Normal appearance.  HENT:     Head: Normocephalic and atraumatic.  Pulmonary:     Effort: Pulmonary effort is normal.  Neurological:     Mental Status: She is alert and oriented to person, place, and time.   Review of Systems  Constitutional:  Negative for chills and fever.  HENT:  Negative for hearing loss.   Eyes:  Negative for discharge and redness.  Respiratory:  Negative for cough.   Cardiovascular:  Negative for chest pain.  Gastrointestinal:  Positive for nausea. Negative for abdominal pain and vomiting.  Musculoskeletal:  Negative for myalgias.  Neurological:  Negative for headaches.  Psychiatric/Behavioral:  Positive for depression and substance abuse. Negative for hallucinations and suicidal ideas.   Blood pressure 116/80, pulse 80, temperature 98 F (36.7 C), temperature source Tympanic, resp. rate 18, SpO2 94 %. There is no height or weight on file to calculate BMI.  Treatment Plan Summary: Sara [name] is a 46 yo female w PMH significant for bipolar disorder, anxiety, PTSD, misuse of benzodiazepines,and alcohol use disorder who was admitted to the [facility] on 01/24/2022 requesting alcohol treatment. Etoh 201; UDS +bzd.   Patient tolerating Ativan taper well.  Most recent CIWA 0.   Patient denies SI/HI/AVH.  She reports nausea this morning although reports that this is now resolved.  Patient was advised to call her attorney to see if she is able to move her upcoming court date so that she can attend inpatient rehab.  Patient remains appropriate for continued treatment at the [facility] for alcohol detox and continued crisis stabilization.     Substance induced mood disorder Alcohol Use Disorder, severe Hx of Complicated Withdrawals -continue CIWA protocol -continue ativan detox- 1 mg QID --> 1 mg TID--> 1 mg BID--> 1 mg daily -multivitamin -thiamine -continue Gabapentin '200mg'$  TID   Bipolar Disorder -Continue home Seroquel '100mg'$  -Continue home Topamax 100 BID -Continue home Lexapro 40 mg daily  Hypokalemia, resolved -2.9 K on presentation (01/24/2022) -received 40 meq x 2 doses with repeat K 4.0 (01/25/2022)  Dispo: Ongoing.  Social work Sports administrator.  Potential transfer to alcohol treatment facility after completion of Ativan taper (ends Saturday morning); however, upcoming court date next week is potential barrier-patient attempting to contact lawyer to postpone court date  Ival Bible, MD 01/26/2022 1:39 PM

## 2022-01-26 NOTE — ED Notes (Signed)
Handout on Anxiety Disorder given to Pt, verbalized understanding ?
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
## 2022-01-26 NOTE — ED Notes (Signed)
Patient denies [place]. Patient is cooperative and interacts well with staff. Respiratory is even and unlabored. No distress noted. Patient in the inning room watching TV at present. Patient stated no complaints at present. will continue to monitor for safety.  ?

## 2022-01-27 DIAGNOSIS — F319 Bipolar disorder, unspecified: Secondary | ICD-10-CM | POA: Diagnosis not present

## 2022-01-27 DIAGNOSIS — F419 Anxiety disorder, unspecified: Secondary | ICD-10-CM | POA: Diagnosis not present

## 2022-01-27 DIAGNOSIS — F1020 Alcohol dependence, uncomplicated: Secondary | ICD-10-CM | POA: Diagnosis not present

## 2022-01-27 DIAGNOSIS — Z20822 Contact with and (suspected) exposure to covid-19: Secondary | ICD-10-CM | POA: Diagnosis not present

## 2022-01-27 MED ORDER — POLYETHYLENE GLYCOL 3350 17 G PO PACK
17.0000 g | PACK | Freq: Every day | ORAL | Status: DC | PRN
  Administered 2022-01-27 – 2022-01-29 (×2): 17 g via ORAL
  Filled 2022-01-27 (×2): qty 1

## 2022-01-27 MED ORDER — QUETIAPINE FUMARATE 100 MG PO TABS
100.0000 mg | ORAL_TABLET | Freq: Every day | ORAL | Status: DC
  Administered 2022-01-27 – 2022-01-30 (×4): 100 mg via ORAL
  Filled 2022-01-27: qty 1
  Filled 2022-01-27: qty 14
  Filled 2022-01-27 (×3): qty 1

## 2022-01-27 NOTE — ED Provider Notes (Signed)
Behavioral Health Progress Note  Date and Time: 01/27/2022 12:16 PM Name: Sara [name] MRN:  [id number]  Subjective:   Niyati Heinke is a 46 yo female w PMH significant for bipolar disorder, anxiety, PTSD, misuse of benzodiazepines,and alcohol use disorder who was admitted to the [facility] on 01/24/2022 requesting alcohol treatment. Etoh 201; UDS +bzd.   Patient seen and chart reviewed-patient has been medication compliant and has been appropriate with staff and peers on the unit; most recent CIWA 0.  Patient tolerating Ativan taper well.  She  describes her mood as "good". She denies SI/HI/AVH. She reports sleeping ~4 hours last night and attributes this to not receiving her home seroquel. She reports nausea, GI upset, and decreased appetite although reports that she ate breakfast this morning. She states that morning nausea is typical for her withdrawal symptoms. She denies tremors, dairrhea, vomiting. She denies all other physical complaints except for constipation. Discussed with patient that a letter was faxed to her lawyer yesterday as requested in an effort to get her court date moved and discussed that [place] will follow up today. Patient verbalized standing and was in agreement. Patient states that she will speak to some family members today to see if they are able to drop off some clothes for her.    Diagnosis:  Final diagnoses:  Bipolar 1 disorder ([place])  Alcohol use disorder, severe, dependence ([place])    Total Time spent with patient: 15 minutes  Past Psychiatric History: AUD, severe; bipolar disorder; mdd; SIMD Past Medical History:  Past Medical History:  Diagnosis Date   Alcohol abuse    Alcoholism ([place])    Anemia    Anxiety    Blood transfusion without reported diagnosis    Cirrhosis ([place])    Depression    Esophageal varices with bleeding(456.0) 06/13/2014   GERD (gastroesophageal reflux disease)    Heart murmur    Patient states she may have   Menorrhagia     Pancytopenia ([place]) 01/15/2014   Pneumonia    Portal hypertension ([place])    S/P alcohol detoxification    2-3 days at behavioral health previously   UGI bleed 06/12/2014    Past Surgical History:  Procedure Laterality Date   CHOLECYSTECTOMY     ESOPHAGOGASTRODUODENOSCOPY N/A 06/12/2014   Procedure: ESOPHAGOGASTRODUODENOSCOPY (EGD);  Surgeon: Gatha Mayer, MD;  Location: [facility];  Service: Endoscopy;  Laterality: N/A;   ESOPHAGOGASTRODUODENOSCOPY (EGD) WITH PROPOFOL N/A 07/29/2014   Procedure: ESOPHAGOGASTRODUODENOSCOPY (EGD) WITH PROPOFOL;  Surgeon: Inda Castle, MD;  Location: [facility];  Service: Endoscopy;  Laterality: N/A;   ESOPHAGOGASTRODUODENOSCOPY (EGD) WITH PROPOFOL N/A 01/20/2018   Procedure: ESOPHAGOGASTRODUODENOSCOPY (EGD) WITH PROPOFOL;  Surgeon: Mauri Pole, MD;  Location: [facility];  Service: Endoscopy;  Laterality: N/A;   Family History:  Family History  Problem Relation Age of Onset   Colon polyps Mother    Hypertension Mother    Thyroid disease Mother    Alcoholism Mother    Alcoholism Father    Alcohol abuse Maternal Grandfather    Alcohol abuse Paternal Grandfather    Alcohol abuse Paternal Aunt    Alcohol abuse Maternal Uncle    Family Psychiatric  History:  per chart, mother, father, MGF and PGF, aunt and uncle with alcohol abuse Social History:  Social History   Substance and Sexual Activity  Alcohol Use Yes   Alcohol/week: 0.0 standard drinks   Comment: Usually drinks 2-3 bottles of wine daily when drinking.      Social History  Substance and Sexual Activity  Drug Use No   Types: Cocaine, Marijuana   Comment: denies    Social History   Socioeconomic History   Marital status: Married    Spouse name: [name]   Number of children: 2   Years of education: Associates   Highest education level: Not on file  Occupational History   Occupation: Paralegal  Tobacco Use   Smoking status: Former    Packs/day: 0.25    Types:  Cigarettes    Quit date: 11/22/2018    Years since quitting: 3.1   Smokeless tobacco: Never  Substance and Sexual Activity   Alcohol use: Yes    Alcohol/week: 0.0 standard drinks    Comment: Usually drinks 2-3 bottles of wine daily when drinking.    Drug use: No    Types: Cocaine, Marijuana    Comment: denies   Sexual activity: Never    Birth control/protection: None  Other Topics Concern   Not on file  Social History Narrative   Lives with husband and son. Daughter lives nearby. 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   Social Determinants of Health   Financial Resource Strain: Not on file  Food Insecurity: Not on file  Transportation Needs: Not on file  Physical Activity: Not on file  Stress: Not on file  Social Connections: Not on file   SDOH:  SDOH Screenings   Alcohol Screen: Not on file  Depression (PHQ2-9): Medium Risk   PHQ-2 Score: 16  Financial Resource Strain: Not on file  Food Insecurity: Not on file  Housing: Not on file  Physical Activity: Not on file  Social Connections: Not on file  Stress: Not on file  Tobacco Use: Medium Risk   Smoking Tobacco Use: Former   Smokeless Tobacco Use: Never   Passive Exposure: Not on file  Transportation Needs: Not on file   Additional Social History:    Pain Medications: See MAR Prescriptions: See MAR Over the Counter: See MAR History of alcohol / drug use?: Yes Longest period of sobriety (when/how long): 9 months in 2017 Negative Consequences of Use: Personal relationships, Work / Youth worker Withdrawal Symptoms: DTs, Sweats, Tremors, Blackouts, Agitation, Irritability Name of Substance 1: Alcohol 1 - Age of First Use: 25 1 - Amount (size/oz): Approximately 1 fifth of vodka 1 - Frequency: Daily 1 - Duration: Ongoing 1 - Last Use / Amount: 01/24/2022 1 - Method of Aquiring: Store 1- Route of Use: Oral ingestion                  Sleep: Fair  Appetite:   decreased 2/2  nausea  Current Medications:  Current Facility-Administered Medications  Medication Dose Route Frequency Provider Last Rate Last Admin   escitalopram (LEXAPRO) tablet 40 mg  40 mg Oral Daily Evette Georges, NP   40 mg at 01/27/22 0909   gabapentin (NEURONTIN) capsule 200 mg  200 mg Oral TID Evette Georges, NP   200 mg at 01/27/22 8768   hydrOXYzine (ATARAX) tablet 25 mg  25 mg Oral TID PRN Evette Georges, NP   25 mg at 01/24/22 2301   loperamide (IMODIUM) capsule 2-4 mg  2-4 mg Oral PRN Evette Georges, NP       LORazepam (ATIVAN) tablet 1 mg  1 mg Oral Q6H PRN Evette Georges, NP       LORazepam (ATIVAN) tablet 1 mg  1 mg Oral BID Evette Georges, NP       Followed by   [  START ON 01/29/2022] LORazepam (ATIVAN) tablet 1 mg  1 mg Oral Daily Evette Georges, NP       multivitamin with minerals tablet 1 tablet  1 tablet Oral Daily Evette Georges, NP   1 tablet at 01/27/22 0909   ondansetron (ZOFRAN-ODT) disintegrating tablet 4 mg  4 mg Oral Q6H PRN Evette Georges, NP   4 mg at 01/27/22 0956   polyethylene glycol (MIRALAX / GLYCOLAX) packet 17 g  17 g Oral Daily PRN Ival Bible, MD   17 g at 01/27/22 1009   QUEtiapine (SEROQUEL) tablet 100 mg  100 mg Oral QHS Ival Bible, MD       thiamine tablet 100 mg  100 mg Oral Daily Evette Georges, NP   100 mg at 01/27/22 0909   topiramate (TOPAMAX) tablet 100 mg  100 mg Oral BID Evette Georges, NP   100 mg at 01/27/22 2585   Current Outpatient Medications  Medication Sig Dispense Refill   escitalopram (LEXAPRO) 20 MG tablet Take 2 tablets (40 mg total) by mouth daily. 60 tablet 1   gabapentin (NEURONTIN) 100 MG capsule Take 2 capsules (200 mg total) by mouth 3 (three) times daily. 180 capsule 1   Multiple Vitamin (MULTIVITAMIN WITH MINERALS) TABS tablet Take 1 tablet by mouth daily.     QUEtiapine (SEROQUEL) 100 MG tablet Take 1 tablet (100 mg total) by mouth at bedtime. 30 tablet 1   topiramate (TOPAMAX) 100 MG tablet Take 1 tablet (100 mg total) by  mouth 2 (two) times daily. 60 tablet 1    Labs  Lab Results:  Admission on 01/24/2022  Component Date Value Ref Range Status   SARS Coronavirus 2 by RT PCR 01/24/2022 NEGATIVE  NEGATIVE Final   Comment: (NOTE) SARS-CoV-2 target nucleic acids are NOT DETECTED.  The SARS-CoV-2 RNA is generally detectable in upper respiratory specimens during the acute phase of infection. The lowest concentration of SARS-CoV-2 viral copies this assay can detect is 138 copies/mL. A negative result does not preclude SARS-Cov-2 infection and should not be used as the sole basis for treatment or other patient management decisions. A negative result may occur with  improper specimen collection/handling, submission of specimen other than nasopharyngeal swab, presence of viral mutation(s) within the areas targeted by this assay, and inadequate number of viral copies(<138 copies/mL). A negative result must be combined with clinical observations, patient history, and epidemiological information. The expected result is Negative.  Fact Sheet for Patients:  EntrepreneurPulse.com.au  Fact Sheet for Healthcare Providers:  IncredibleEmployment.be  This test is no                          t yet approved or cleared by the [place] FDA and  has been authorized for detection and/or diagnosis of SARS-CoV-2 by FDA under an Emergency Use Authorization (EUA). This EUA will remain  in effect (meaning this test can be used) for the duration of the COVID-19 declaration under Section 564(b)(1) of the Act, 21 U.S.C.section 360bbb-3(b)(1), unless the authorization is terminated  or revoked sooner.       Influenza A by PCR 01/24/2022 NEGATIVE  NEGATIVE Final   Influenza B by PCR 01/24/2022 NEGATIVE  NEGATIVE Final   Comment: (NOTE) The Xpert Xpress SARS-CoV-2/FLU/RSV plus assay is intended as an aid in the diagnosis of influenza from Nasopharyngeal swab specimens and should not be  used as a sole basis for treatment. Nasal washings and aspirates are unacceptable for  Xpert Xpress SARS-CoV-2/FLU/RSV testing.  Fact Sheet for Patients: EntrepreneurPulse.com.au  Fact Sheet for Healthcare Providers: IncredibleEmployment.be  This test is not yet approved or cleared by the [place] FDA and has been authorized for detection and/or diagnosis of SARS-CoV-2 by FDA under an Emergency Use Authorization (EUA). This EUA will remain in effect (meaning this test can be used) for the duration of the COVID-19 declaration under Section 564(b)(1) of the Act, 21 U.S.C. section 360bbb-3(b)(1), unless the authorization is terminated or revoked.  Performed at [facility], [place] [address] [place], [place] [address]    SARS Coronavirus 2 Ag 01/24/2022 Negative  Negative Preliminary   WBC 01/24/2022 4.0  4.0 - 10.5 K/uL Final   RBC 01/24/2022 3.79 (L)  3.87 - 5.11 MIL/uL Final   Hemoglobin 01/24/2022 11.4 (L)  12.0 - 15.0 g/dL Final   HCT 01/24/2022 34.4 (L)  36.0 - 46.0 % Final   MCV 01/24/2022 90.8  80.0 - 100.0 fL Final   MCH 01/24/2022 30.1  26.0 - 34.0 pg Final   MCHC 01/24/2022 33.1  30.0 - 36.0 g/dL Final   RDW 01/24/2022 16.0 (H)  11.5 - 15.5 % Final   Platelets 01/24/2022 99 (L)  150 - 400 K/uL Final   Comment: Immature Platelet Fraction may be clinically indicated, consider ordering this additional test [id number] CONSISTENT WITH PREVIOUS RESULT REPEATED TO VERIFY    nRBC 01/24/2022 0.0  0.0 - 0.2 % Final   Neutrophils Relative % 01/24/2022 49  % Final   Neutro Abs 01/24/2022 2.0  1.7 - 7.7 K/uL Final   Lymphocytes Relative 01/24/2022 40  % Final   Lymphs Abs 01/24/2022 1.6  0.7 - 4.0 K/uL Final   Monocytes Relative 01/24/2022 9  % Final   Monocytes Absolute 01/24/2022 0.4  0.1 - 1.0 K/uL Final   Eosinophils Relative 01/24/2022 1  % Final   Eosinophils Absolute 01/24/2022 0.1  0.0 - 0.5 K/uL Final   Basophils Relative  01/24/2022 1  % Final   Basophils Absolute 01/24/2022 0.0  0.0 - 0.1 K/uL Final   Immature Granulocytes 01/24/2022 0  % Final   Abs Immature Granulocytes 01/24/2022 0.01  0.00 - 0.07 K/uL Final   Performed at [facility], [place] [address] [place], [place] [address]   Sodium 01/24/2022 136  135 - 145 mmol/L Final   Potassium 01/24/2022 2.8 (L)  3.5 - 5.1 mmol/L Final   Chloride 01/24/2022 109  98 - 111 mmol/L Final   CO2 01/24/2022 17 (L)  22 - 32 mmol/L Final   Glucose, Bld 01/24/2022 90  70 - 99 mg/dL Final   Glucose reference range applies only to samples taken after fasting for at least 8 hours.   BUN 01/24/2022 6  6 - 20 mg/dL Final   Creatinine, Ser 01/24/2022 0.68  0.44 - 1.00 mg/dL Final   Calcium 01/24/2022 8.2 (L)  8.9 - 10.3 mg/dL Final   Total Protein 01/24/2022 7.0  6.5 - 8.1 g/dL Final   Albumin 01/24/2022 3.3 (L)  3.5 - 5.0 g/dL Final   AST 01/24/2022 36  15 - 41 U/L Final   ALT 01/24/2022 22  0 - 44 U/L Final   Alkaline Phosphatase 01/24/2022 77  38 - 126 U/L Final   Total Bilirubin 01/24/2022 0.9  0.3 - 1.2 mg/dL Final   GFR, Estimated 01/24/2022 >60  >60 mL/min Final   Comment: (NOTE) Calculated using the CKD-EPI Creatinine Equation (2021)    Anion gap  01/24/2022 10  5 - 15 Final   Performed at [facility], [place] [address], [place], [place] [address]   Hgb A1c MFr Bld 01/24/2022 4.7 (L)  4.8 - 5.6 % Final   Comment: (NOTE) Pre diabetes:          5.7%-6.4%  Diabetes:              >6.4%  Glycemic control for   <7.0% adults with diabetes    Mean Plasma Glucose 01/24/2022 88.19  mg/dL Final   Performed at [facility], [place] [address] [place], [place] [address]   Alcohol, Ethyl (B) 01/24/2022 201 (H)  <10 mg/dL Final   Comment: (NOTE) Lowest detectable limit for serum alcohol is 10 mg/dL.  For medical purposes only. Performed at [facility], [place] [address] [place], [place] [address]    TSH 01/24/2022 3.012  0.350 - 4.500 uIU/mL Final    Comment: Performed by a 3rd Generation assay with a functional sensitivity of <=0.01 uIU/mL. Performed at [facility], [place] [address], [place], [place] [address]    Color, Urine 01/24/2022 YELLOW  YELLOW Final   APPearance 01/24/2022 CLEAR  CLEAR Final   Specific Gravity, Urine 01/24/2022 1.006  1.005 - 1.030 Final   pH 01/24/2022 7.0  5.0 - 8.0 Final   Glucose, UA 01/24/2022 NEGATIVE  NEGATIVE mg/dL Final   Hgb urine dipstick 01/24/2022 NEGATIVE  NEGATIVE Final   Bilirubin Urine 01/24/2022 NEGATIVE  NEGATIVE Final   Ketones, ur 01/24/2022 NEGATIVE  NEGATIVE mg/dL Final   Protein, ur 01/24/2022 NEGATIVE  NEGATIVE mg/dL Final   Nitrite 01/24/2022 NEGATIVE  NEGATIVE Final   Leukocytes,Ua 01/24/2022 NEGATIVE  NEGATIVE Final   Performed at [facility], [place] [address], [place], [place] [address]   Preg Test, Ur 01/24/2022 NEGATIVE  NEGATIVE Final   Comment:        THE SENSITIVITY OF THIS METHODOLOGY IS >20 mIU/mL. Performed at [facility], [place] [address] [place], [place] [address]    POC Amphetamine UR 01/24/2022 None Detected  NONE DETECTED (Cut Off Level 1000 ng/mL) Final   POC Secobarbital (BAR) 01/24/2022 None Detected  NONE DETECTED (Cut Off Level 300 ng/mL) Final   POC Buprenorphine (BUP) 01/24/2022 None Detected  NONE DETECTED (Cut Off Level 10 ng/mL) Final   POC Oxazepam (BZO) 01/24/2022 Positive (A)  NONE DETECTED (Cut Off Level 300 ng/mL) Final   POC Cocaine UR 01/24/2022 None Detected  NONE DETECTED (Cut Off Level 300 ng/mL) Final   POC Methamphetamine UR 01/24/2022 None Detected  NONE DETECTED (Cut Off Level 1000 ng/mL) Final   POC Morphine 01/24/2022 None Detected  NONE DETECTED (Cut Off Level 300 ng/mL) Final   POC Oxycodone UR 01/24/2022 None Detected  NONE DETECTED (Cut Off Level 100 ng/mL) Final   POC Methadone UR 01/24/2022 None Detected  NONE DETECTED (Cut Off Level 300 ng/mL) Final   POC Marijuana UR 01/24/2022 None Detected  NONE DETECTED (Cut  Off Level 50 ng/mL) Final   Preg Test, Ur 01/24/2022 NEGATIVE  NEGATIVE Final   Comment:        THE SENSITIVITY OF THIS METHODOLOGY IS >24 mIU/mL    SARSCOV2ONAVIRUS 2 AG 01/24/2022 NEGATIVE  NEGATIVE Final   Comment: (NOTE) SARS-CoV-2 antigen NOT DETECTED.   Negative results are presumptive.  Negative results do not preclude SARS-CoV-2 infection and should not be used as the sole basis for treatment or other patient management decisions, including  infection  control decisions, particularly in the presence of clinical signs and  symptoms consistent with COVID-19, or in those who have been in contact with the virus.  Negative results must be combined with clinical observations, patient history, and epidemiological information. The expected result is Negative.  Fact Sheet for Patients: HandmadeRecipes.com.cy  Fact Sheet for Healthcare Providers: FuneralLife.at  This test is not yet approved or cleared by the [place] FDA and  has been authorized for detection and/or diagnosis of SARS-CoV-2 by FDA under an Emergency Use Authorization (EUA).  This EUA will remain in effect (meaning this test can be used) for the duration of  the COV                          ID-19 declaration under Section 564(b)(1) of the Act, 21 U.S.C. section 360bbb-3(b)(1), unless the authorization is terminated or revoked sooner.     Cholesterol 01/24/2022 137  0 - 200 mg/dL Final   Triglycerides 01/24/2022 189 (H)  <150 mg/dL Final   HDL 01/24/2022 32 (L)  >40 mg/dL Final   Total CHOL/HDL Ratio 01/24/2022 4.3  RATIO Final   VLDL 01/24/2022 38  0 - 40 mg/dL Final   LDL Cholesterol 01/24/2022 67  0 - 99 mg/dL Final   Comment:        Total Cholesterol/HDL:CHD Risk Coronary Heart Disease Risk Table                     Men   Women  1/2 Average Risk   3.4   3.3  Average Risk       5.0   4.4  2 X Average Risk   9.6   7.1  3 X Average Risk  23.4   11.0         Use the calculated Patient Ratio above and the CHD Risk Table to determine the patient's CHD Risk.        ATP III CLASSIFICATION (LDL):  <100     mg/dL   Optimal  100-129  mg/dL   Near or Above                    Optimal  130-159  mg/dL   Borderline  160-189  mg/dL   High  >190     mg/dL   Very High Performed at [facility] [address], [place], [place] [address]    Sodium 01/25/2022 139  135 - 145 mmol/L Final   Potassium 01/25/2022 4.0  3.5 - 5.1 mmol/L Final   NO VISIBLE HEMOLYSIS   Chloride 01/25/2022 114 (H)  98 - 111 mmol/L Final   CO2 01/25/2022 19 (L)  22 - 32 mmol/L Final   Glucose, Bld 01/25/2022 83  70 - 99 mg/dL Final   Glucose reference range applies only to samples taken after fasting for at least 8 hours.   BUN 01/25/2022 7  6 - 20 mg/dL Final   Creatinine, Ser 01/25/2022 0.53  0.44 - 1.00 mg/dL Final   Calcium 01/25/2022 8.4 (L)  8.9 - 10.3 mg/dL Final   Total Protein 01/25/2022 6.7  6.5 - 8.1 g/dL Final   Albumin 01/25/2022 3.1 (L)  3.5 - 5.0 g/dL Final   AST 01/25/2022 33  15 - 41 U/L Final   ALT 01/25/2022 19  0 - 44 U/L Final   Alkaline Phosphatase 01/25/2022 70  38 - 126 U/L Final   Total  Bilirubin 01/25/2022 0.8  0.3 - 1.2 mg/dL Final   GFR, Estimated 01/25/2022 >60  >60 mL/min Final   Comment: (NOTE) Calculated using the CKD-EPI Creatinine Equation (2021)    Anion gap 01/25/2022 6  5 - 15 Final   Performed at [facility] [address], [place], [place] [address]  Admission on 10/07/2021, Discharged on 10/11/2021  Component Date Value Ref Range Status   POC Amphetamine UR 10/07/2021 None Detected  NONE DETECTED (Cut Off Level 1000 ng/mL) Final   POC Secobarbital (BAR) 10/07/2021 None Detected  NONE DETECTED (Cut Off Level 300 ng/mL) Final   POC Buprenorphine (BUP) 10/07/2021 None Detected  NONE DETECTED (Cut Off Level 10 ng/mL) Final   POC Oxazepam (BZO) 10/07/2021 Positive (A)  NONE DETECTED (Cut Off Level 300 ng/mL) Final   POC  Cocaine UR 10/07/2021 None Detected  NONE DETECTED (Cut Off Level 300 ng/mL) Final   POC Methamphetamine UR 10/07/2021 None Detected  NONE DETECTED (Cut Off Level 1000 ng/mL) Final   POC Morphine 10/07/2021 None Detected  NONE DETECTED (Cut Off Level 300 ng/mL) Final   POC Oxycodone UR 10/07/2021 None Detected  NONE DETECTED (Cut Off Level 100 ng/mL) Final   POC Methadone UR 10/07/2021 None Detected  NONE DETECTED (Cut Off Level 300 ng/mL) Final   POC Marijuana UR 10/07/2021 None Detected  NONE DETECTED (Cut Off Level 50 ng/mL) Final  Admission on 10/06/2021, Discharged on 10/07/2021  Component Date Value Ref Range Status   Lipase 10/06/2021 93 (H)  11 - 51 U/L Final   Performed at [facility], [place] [address] [place], [place] [address]   Sodium 10/06/2021 140  135 - 145 mmol/L Final   Potassium 10/06/2021 3.3 (L)  3.5 - 5.1 mmol/L Final   Chloride 10/06/2021 110  98 - 111 mmol/L Final   CO2 10/06/2021 20 (L)  22 - 32 mmol/L Final   Glucose, Bld 10/06/2021 119 (H)  70 - 99 mg/dL Final   Glucose reference range applies only to samples taken after fasting for at least 8 hours.   BUN 10/06/2021 9  6 - 20 mg/dL Final   Creatinine, Ser 10/06/2021 0.52  0.44 - 1.00 mg/dL Final   Calcium 10/06/2021 8.4 (L)  8.9 - 10.3 mg/dL Final   Total Protein 10/06/2021 7.2  6.5 - 8.1 g/dL Final   Albumin 10/06/2021 3.1 (L)  3.5 - 5.0 g/dL Final   AST 10/06/2021 49 (H)  15 - 41 U/L Final   ALT 10/06/2021 24  0 - 44 U/L Final   Alkaline Phosphatase 10/06/2021 88  38 - 126 U/L Final   Total Bilirubin 10/06/2021 0.8  0.3 - 1.2 mg/dL Final   GFR, Estimated 10/06/2021 >60  >60 mL/min Final   Comment: (NOTE) Calculated using the CKD-EPI Creatinine Equation (2021)    Anion gap 10/06/2021 10  5 - 15 Final   Performed at [facility], [place] [address], [place], [place] [address]   WBC 10/06/2021 2.2 (L)  4.0 - 10.5 K/uL Final   RBC 10/06/2021 3.93  3.87 - 5.11 MIL/uL Final    Hemoglobin 10/06/2021 11.4 (L)  12.0 - 15.0 g/dL Final   HCT 10/06/2021 35.1 (L)  36.0 - 46.0 % Final   MCV 10/06/2021 89.3  80.0 - 100.0 fL Final   MCH 10/06/2021 29.0  26.0 - 34.0 pg Final   MCHC 10/06/2021 32.5  30.0 - 36.0 g/dL Final   RDW 10/06/2021 17.4 (H)  11.5 - 15.5 % Final   Platelets 10/06/2021 76 (L)  150 - 400 K/uL Final   Comment: SPECIMEN CHECKED FOR CLOTS Immature Platelet Fraction may be clinically indicated, consider ordering this additional test [id number] REPEATED TO VERIFY PLATELET COUNT CONFIRMED BY SMEAR    nRBC 10/06/2021 0.0  0.0 - 0.2 % Final   Performed at [facility], [place] [address], [place], [place] [address]   Color, Urine 10/07/2021 YELLOW  YELLOW Final   APPearance 10/07/2021 CLOUDY (A)  CLEAR Final   Specific Gravity, Urine 10/07/2021 1.011  1.005 - 1.030 Final   pH 10/07/2021 5.0  5.0 - 8.0 Final   Glucose, UA 10/07/2021 NEGATIVE  NEGATIVE mg/dL Final   Hgb urine dipstick 10/07/2021 SMALL (A)  NEGATIVE Final   Bilirubin Urine 10/07/2021 NEGATIVE  NEGATIVE Final   Ketones, ur 10/07/2021 5 (A)  NEGATIVE mg/dL Final   Protein, ur 10/07/2021 NEGATIVE  NEGATIVE mg/dL Final   Nitrite 10/07/2021 NEGATIVE  NEGATIVE Final   Leukocytes,Ua 10/07/2021 LARGE (A)  NEGATIVE Final   RBC / HPF 10/07/2021 6-10  0 - 5 RBC/hpf Final   WBC, UA 10/07/2021 11-20  0 - 5 WBC/hpf Final   Bacteria, UA 10/07/2021 MANY (A)  NONE SEEN Final   Squamous Epithelial / LPF 10/07/2021 11-20  0 - 5 Final   Mucus 10/07/2021 PRESENT   Final   Performed at [facility], [place] [address] [place], [place] [address]   I-stat hCG, quantitative 10/06/2021 <5.0  <5 mIU/mL Final   Comment 3 10/06/2021          Final   Comment:   GEST. AGE      CONC.  (mIU/mL)   <=1 WEEK        5 - 50     2 WEEKS       50 - 500     3 WEEKS       100 - 10,000     4 WEEKS     1,000 - 30,000        FEMALE AND NON-PREGNANT FEMALE:     LESS THAN 5 mIU/mL    Alcohol, Ethyl  (B) 10/06/2021 285 (H)  <10 mg/dL Final   Comment: (NOTE) Lowest detectable limit for serum alcohol is 10 mg/dL.  For medical purposes only. Performed at [facility], [place] [address], [place], [place] [address]    SARS Coronavirus 2 by RT PCR 10/07/2021 NEGATIVE  NEGATIVE Final   Comment: (NOTE) SARS-CoV-2 target nucleic acids are NOT DETECTED.  The SARS-CoV-2 RNA is generally detectable in upper respiratory specimens during the acute phase of infection. The lowest concentration of SARS-CoV-2 viral copies this assay can detect is 138 copies/mL. A negative result does not preclude SARS-Cov-2 infection and should not be used as the sole basis for treatment or other patient management decisions. A negative result may occur with  improper specimen collection/handling, submission of specimen other than nasopharyngeal swab, presence of viral mutation(s) within the areas targeted by this assay, and inadequate number of viral copies(<138 copies/mL). A negative result must be combined with clinical observations, patient history, and epidemiological information. The expected result is Negative.  Fact Sheet for Patients:  EntrepreneurPulse.com.au  Fact Sheet for Healthcare Providers:  IncredibleEmployment.be  This test is no                          t yet approved or cleared by the  [place] [employer] and  has been authorized for detection and/or diagnosis of SARS-CoV-2 by FDA under an Print production planner (EUA). This EUA will remain  in effect (meaning this test can be used) for the duration of the COVID-19 declaration under Section 564(b)(1) of the Act, 21 U.S.C.section 360bbb-3(b)(1), unless the authorization is terminated  or revoked sooner.       Influenza A by PCR 10/07/2021 NEGATIVE  NEGATIVE Final   Influenza B by PCR 10/07/2021 NEGATIVE  NEGATIVE Final   Comment: (NOTE) The Xpert Xpress SARS-CoV-2/FLU/RSV plus assay  is intended as an aid in the diagnosis of influenza from Nasopharyngeal swab specimens and should not be used as a sole basis for treatment. Nasal washings and aspirates are unacceptable for Xpert Xpress SARS-CoV-2/FLU/RSV testing.  Fact Sheet for Patients: EntrepreneurPulse.com.au  Fact Sheet for Healthcare Providers: IncredibleEmployment.be  This test is not yet approved or cleared by the [place] FDA and has been authorized for detection and/or diagnosis of SARS-CoV-2 by FDA under an Emergency Use Authorization (EUA). This EUA will remain in effect (meaning this test can be used) for the duration of the COVID-19 declaration under Section 564(b)(1) of the Act, 21 U.S.C. section 360bbb-3(b)(1), unless the authorization is terminated or revoked.  Performed at [facility], [place] [address] [place], [place] [address]     Blood Alcohol level:  Lab Results  Component Value Date   ETH 201 (H) 01/24/2022   ETH 285 (H) 25/00/3704    Metabolic Disorder Labs: Lab Results  Component Value Date   HGBA1C 4.7 (L) 01/24/2022   MPG 88.19 01/24/2022   MPG 102.54 01/20/2021   Lab Results  Component Value Date   PROLACTIN 28.7 (H) 05/04/2017   Lab Results  Component Value Date   CHOL 137 01/24/2022   TRIG 189 (H) 01/24/2022   HDL 32 (L) 01/24/2022   CHOLHDL 4.3 01/24/2022   VLDL 38 01/24/2022   LDLCALC 67 01/24/2022   LDLCALC 83 01/20/2021    Therapeutic Lab Levels: No results found for: LITHIUM No results found for: VALPROATE No components found for:  CBMZ  Physical Findings   AIMS    Flowsheet Row Admission (Discharged) from 04/19/2019 in [facility] Admission (Discharged) from 05/02/2017 in [facility]  AIMS Total Score 0 0      AUDIT    Flowsheet Row Admission (Discharged) from 04/19/2019 in [facility]  Admission (Discharged) from 05/02/2017 in [facility] Admission (Discharged) from 12/22/2014 in [facility] ED from 11/05/2014 in [place] DEPT Admission (Discharged) from 02/02/2014 in [facility]  Alcohol Use Disorder Identification Test Final Score (AUDIT) 34 34 29 35 38      GAD-7    Flowsheet Row Video Visit from 11/09/2021 in [facility] Video Visit from 07/08/2021 in [facility] Video Visit from 05/13/2021 in [facility] Video Visit from 04/02/2021 in [facility] Video Visit from 02/17/2021 in [facility]  Total GAD-7 Score '16 18 16 15 11      '$ PHQ2-9    Flowsheet Row Video Visit from 11/09/2021 in [facility] ED from 10/06/2021 in [place] DEPT Video Visit from 07/08/2021 in [facility] Video Visit from 05/13/2021 in [facility] Video Visit from 04/02/2021 in [facility]  PHQ-2 Total Score '2 4 2 2 3  '$ PHQ-9 Total Score '16 15 9 10 15      '$ Flowsheet Row ED from 01/24/2022 in [facility] ED from 01/01/2022 in [facility] Video Visit from 11/09/2021 in [facility] No Risk No Risk No Risk        Musculoskeletal  Strength & Muscle Tone: within normal limits Gait & Station: normal Patient leans: N/A  Psychiatric Specialty Exam  Presentation  General Appearance: Casual  Eye Contact:Good  Speech:Clear and Coherent; Normal Rate  Speech Volume:Normal  Handedness:Right   Mood and Affect  Mood:-- ("good")  Affect:Appropriate; Congruent; Other (comment) (brighter  than yesterday)   Thought Process  Thought Processes:Coherent; Goal Directed; Linear  Descriptions of Associations:Intact  Orientation:Full (Time, Place and Person)  Thought Content:WDL; Logical  Diagnosis of Schizophrenia or Schizoaffective disorder in past: No    Hallucinations:Hallucinations: None  Ideas of Reference:None  Suicidal Thoughts:Suicidal Thoughts: No  Homicidal Thoughts:Homicidal Thoughts: No   Sensorium  Memory:Immediate Good; Recent Good; Remote Good  Judgment:Fair  Insight:Fair   Executive Functions  Concentration:Good  Attention Span:Good  Gray of Knowledge:Good  Language:Good   Psychomotor Activity  Psychomotor Activity:Psychomotor Activity: Normal   Assets  Assets:Communication Skills; Desire for Improvement; Resilience   Sleep  Sleep:Sleep: Fair   No data recorded   Physical Exam  Physical Exam Constitutional:      Appearance: Normal appearance.  HENT:     Head: Normocephalic and atraumatic.  Pulmonary:     Effort: Pulmonary effort is normal.  Neurological:     Mental Status: She is alert and oriented to person, place, and time.   Review of Systems  Constitutional:  Negative for chills and fever.  HENT:  Negative for hearing loss.   Eyes:  Negative for discharge and redness.  Respiratory:  Negative for cough.   Cardiovascular:  Negative for chest pain.  Gastrointestinal:  Positive for abdominal pain, constipation and nausea. Negative for vomiting.  Musculoskeletal:  Negative for myalgias.  Neurological:  Negative for headaches.  Psychiatric/Behavioral:  Positive for depression and substance abuse. Negative for hallucinations and suicidal ideas. The patient has insomnia.   Blood pressure 90/64, pulse 70, temperature 97.8 F (36.6 C), temperature source Temporal, resp. rate 18, SpO2 96 %. There is no height or weight on file to calculate BMI.  Treatment Plan Summary: Fonnie Crookshanks is a 46 yo female w  PMH significant for bipolar disorder, anxiety, PTSD, misuse of benzodiazepines,and alcohol use disorder who was admitted to the [facility] on 01/24/2022 requesting alcohol treatment. Etoh 201; UDS +bzd.   Patient tolerating Ativan taper well.  Most recent CIWA 0.  Patient denies SI/HI/AVH.  She reports nausea, GI upset and constipation this morning.  Letter was sent to attorney yesterday in an attempt to get upcoming court date moved so she may attend treatment-CSW waiting to hear back from attorney. t  Patient remains appropriate for continued treatment at the [facility] for alcohol detox and continued crisis stabilization.     Substance induced mood disorder Alcohol Use Disorder, severe Hx of Complicated Withdrawals -continue CIWA protocol -continue ativan detox- 1 mg QID --> 1 mg TID--> 1 mg BID--> 1 mg daily -multivitamin -thiamine -continue Gabapentin '200mg'$  TID   Bipolar Disorder -Continue home Seroquel '100mg'$  -Continue home Topamax 100 BID -Continue home Lexapro 40 mg daily  Hypokalemia, resolved -  2.9 K on presentation (01/24/2022) -received 40 meq x 2 doses with repeat K 4.0 (01/25/2022)  Dispo: Ongoing.  Social work Sports administrator.  Potential transfer to alcohol treatment facility after completion of Ativan taper (ends Saturday morning); however, upcoming court date next week is potential barrier-letter faxed to lawyer- CSW to follow up  Ival Bible, MD 01/27/2022 12:16 PM

## 2022-01-27 NOTE — Progress Notes (Signed)
Camera reviewed by security, patient did not fully enter the nurses station before being confronted by staff and escorted back to the dinning area. ?

## 2022-01-27 NOTE — Progress Notes (Signed)
CSW spoke with the patient.  Patient reports that she last spoke with her lawyer 2 days ago.  ? ?She reports that she is unsure if lawyer has received the letter that would allow her to be able to attend [facility]. ? ?CSW obtained verbal permission to reach out to the pt's lawyer.   ? ?CSW called Volney Presser, lawyer, [phone].  CSW left HIPAA compliant voicemail. ? ?Assunta Curtis, MSW, LCSW ?01/27/2022 10:51 AM  ?

## 2022-01-27 NOTE — Progress Notes (Signed)
RN was informed by MHT, patient entered nurses station after asking for ketchup while sitting down in the dinning area. RN called security to see if cameras could be viewed. ?

## 2022-01-27 NOTE — Progress Notes (Signed)
CSW again attempted to reach the patient's lawyer.  CSW will continue to follow up. ? ?Assunta Curtis, MSW, LCSW ?01/27/2022 4:25 PM  ?

## 2022-01-27 NOTE — Progress Notes (Signed)
CSW again attempted to call Volney Presser, lawyer, [phone].   ? ?CSW was unable to reach pt's lawyer at this time. ? ?Assunta Curtis, MSW, LCSW ?01/27/2022 11:43 AM  ?

## 2022-01-27 NOTE — Progress Notes (Signed)
Patient sitting down eating dinner. No acute distress observed. ?

## 2022-01-27 NOTE — Progress Notes (Signed)
Patient is alert and oriented X 4, denies SI, HI and AVH to RN. Patient affect is sad, logical and coherent in speech, unremarkable in appearance, completed ADLs this morning. Patient is cooperative, complaint of constipation and nausea given mira lax and Zofran. Patient does not exhibit any acute behaviors, nursing staff will continue to monitor. ?

## 2022-01-27 NOTE — Medical Student Note (Signed)
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 information. The expected result is Negative.  Fact  Sheet for Patients:  EntrepreneurPulse.com.au  Fact Sheet for Healthcare Providers:  IncredibleEmployment.be  This test is no                          t yet approved or cleared by the [place] FDA and  has been authorized for detection and/or diagnosis of SARS-CoV-2 by FDA under an Emergency Use Authorization (EUA). This EUA will remain  in effect (meaning this test can be used) for the duration of the COVID-19 declaration under Section  564(b)(1) of the Act, 21 U.S.C.section 360bbb-3(b)(1), unless the authorization is terminated  or revoked sooner.       Influenza A by PCR 10/07/2021 NEGATIVE  NEGATIVE Final   Influenza B by PCR 10/07/2021 NEGATIVE  NEGATIVE Final   Comment: (NOTE) The Xpert Xpress SARS-CoV-2/FLU/RSV plus assay is intended as an aid in the diagnosis of influenza from Nasopharyngeal swab specimens and should not be used as a sole basis for treatment. Nasal washings and aspirates are unacceptable for Xpert Xpress SARS-CoV-2/FLU/RSV testing.  Fact Sheet for Patients: EntrepreneurPulse.com.au  Fact Sheet for Healthcare Providers: IncredibleEmployment.be  This test is not yet approved or cleared by the [place] FDA and has been authorized for detection and/or diagnosis of SARS-CoV-2 by FDA under an Emergency Use Authorization (EUA). This EUA will remain in effect (meaning this test can be used) for the duration of the COVID-19 declaration under Section 564(b)(1) of the Act, 21 U.S.C. section 360bbb-3(b)(1), unless the authorization is terminated or revoked.  Performed at [facility], [place] [address], [place], [place] [address]     Blood Alcohol level:  Lab Results  Component Value Date   ETH 201 (H) 01/24/2022   ETH 285 (H) 25/85/2778    Metabolic Disorder Labs: Lab Results  Component Value Date   HGBA1C 4.7 (L) 01/24/2022   MPG 88.19 01/24/2022   MPG 102.54 01/20/2021   Lab Results  Component Value Date   PROLACTIN 28.7 (H) 05/04/2017   Lab Results  Component Value Date   CHOL 137 01/24/2022   TRIG 189 (H) 01/24/2022   HDL 32 (L) 01/24/2022   CHOLHDL 4.3 01/24/2022   VLDL 38 01/24/2022   LDLCALC 67 01/24/2022   LDLCALC 83 01/20/2021    Therapeutic Lab Levels: No results found for: LITHIUM No results found for: VALPROATE No components found for:  CBMZ  Physical Findings   AIMS    Flowsheet Row Admission  (Discharged) from 04/19/2019 in [facility] Admission (Discharged) from 05/02/2017 in [facility]  AIMS Total Score 0 0      AUDIT    Flowsheet Row Admission (Discharged) from 04/19/2019 in [facility] Admission (Discharged) from 05/02/2017 in [facility] Admission (Discharged) from 12/22/2014 in [facility] Admission (Discharged) from 02/02/2014 in [facility] Admission (Discharged) from 09/27/2013 in [facility]  Alcohol Use Disorder Identification Test Final Score (AUDIT) 34 34 29 38 34      GAD-7    Flowsheet Row Video Visit from 11/09/2021 in [facility] Video Visit from 07/08/2021 in [facility] Video Visit from 05/13/2021 in [facility] Video Visit from 04/02/2021 in [facility] Video Visit from 02/17/2021 in [facility]  Total GAD-7 Score 16 18  $'16 15 11      'e$ PHQ2-9    Flowsheet Row Video Visit from 11/09/2021 in [facility] ED from 10/06/2021 in [facility] Video Visit from 07/08/2021 in [facility] Video Visit from 05/13/2021 in [facility] Video Visit from 04/02/2021 in [place]  PHQ-2 Total Score '2 4 2 2 3  '$ PHQ-9 Total Score '16 15 9 10 15      '$ [place] ED from 01/24/2022 in [facility] ED from 01/01/2022 in [facility] Video Visit from 11/09/2021 in [employer] [facility] No Risk No Risk No Risk        Musculoskeletal  Strength & Muscle Tone: within normal  limits Gait & Station: normal Patient leans: N/A  Psychiatric Specialty Exam  Presentation  General Appearance: Appropriate for Environment; Casual  Eye Contact:Fair  Speech:Clear and Coherent; Normal Rate  Speech Volume:Normal  Handedness:Right   Mood and Affect  Mood: "good" Affect:Appropriate; Congruent   Thought Process  Thought Processes:Coherent; Goal Directed; Linear  Descriptions of Associations:Intact  Orientation:Full (Time, Place and Person)  Thought Content:WDL; Logical  Diagnosis of Schizophrenia or Schizoaffective disorder in past: No    Hallucinations:Hallucinations: None  Ideas of Reference:None  Suicidal Thoughts:Suicidal Thoughts: No  Homicidal Thoughts:Homicidal Thoughts: No   Sensorium  Memory:Immediate Good; Recent Good; Remote Good  Judgment:Fair  Insight:Fair   Executive Functions  Concentration:Good  Attention Span:Good  [place] of Knowledge:Good  Language:Good   Psychomotor Activity  Psychomotor Activity:Psychomotor Activity: Normal   Assets  Assets:Communication Skills; Desire for Improvement; Resilience   Sleep  Sleep: Sleep: poor  No data recorded  Physical Exam  Physical Exam Constitutional:      Appearance: Normal appearance.  HENT:     Head: Normocephalic and atraumatic.  Pulmonary:     Effort: Pulmonary effort is normal.  Neurological:     Mental Status: She is alert and oriented to person, place, and time.    Review of Systems  Constitutional:  Negative for chills and fever.  HENT:  Negative for hearing loss.   Eyes:  Negative for discharge and redness.  Respiratory:  Negative for cough.   Cardiovascular:  Negative for chest pain.  Gastrointestinal:  Positive for nausea. Positive for constipation. Negative for abdominal pain and vomiting.  Musculoskeletal:  Negative for myalgias.  Neurological:  Negative for headaches.  Psychiatric/Behavioral:  Positive for depression and substance  abuse. Negative for hallucinations and suicidal ideas.   Blood pressure 90/64, pulse 70, temperature 97.8 F (36.6 C), temperature source Temporal, resp. rate 18, SpO2 96 %. There is no height or weight on file to calculate BMI.  Treatment Plan Summary: Sara [name] is a 46 yo female w PMH significant for bipolar disorder, anxiety, PTSD, misuse of benzodiazepines,and alcohol use disorder who was admitted to the [facility] on 01/24/2022 requesting alcohol treatment. Etoh 201; UDS +bzd.    Patient tolerating Ativan taper well. Most recent CIWA 0. Patient denies SI/HI/AH. Endorses seeing a shadow in the bathroom. She reports nausea, constipation, and diaphoresis this morning. Nursing administered PRNs for nausea and constipation. Patient remains appropriate for continued treatment at the [facility] for alcohol detox and continued crisis stabilization.  Substance induced mood disorder Alcohol Use Disorder, severe Hx of Complicated Withdrawals -continue CIWA protocol -continue ativan detox- 1 mg QID --> 1 mg TID--> 1 mg BID--> 1 mg daily -multivitamin -thiamine -  continue Gabapentin '200mg'$  TID   Bipolar Disorder -Restart home Seroquel 100 mg tonight -Continue home Topamax 100 BID -Continue home Lexapro 40 mg daily   Hypokalemia, resolved -2.9 K on presentation (01/24/2022) -received 40 meq x 2 doses with repeat K 4.0 (01/25/2022)   Dispo: Ongoing.  Social work Sports administrator.  Potential transfer to alcohol treatment facility after completion of Ativan taper (ends Saturday morning). On 01/26/22 we faxed her attorney a letter about patient's treatment to postpone her 02/02/22 court date- pending SW confirmation from attorney.  Sara [name], Medical Student 01/27/2022 11:25 AM

## 2022-01-27 NOTE — Progress Notes (Signed)
Room checked completed by RN no contraband found. Staff will continue to monitor. ?

## 2022-01-27 NOTE — ED Notes (Signed)
Received patient this PM. Patient is in the day room waiting to attend [place] meeting. Patient respirations are even and unlabored. Will continue to monitor for safety.  ?

## 2022-01-27 NOTE — Progress Notes (Signed)
Patient went outdoors with RN and peers. Patient behaved appropriately. Patient asked RN for something for anxiety. Patient received  Hydroxyzine 25 mg as well as scheduled gabapentin 200 mg. Patient asked for constipation medication, RN informed patient miralax was given earlier and it is scheduled daily. Nursing staff will continue to monitor. ?

## 2022-01-27 NOTE — ED Notes (Signed)
Patient is in bed asleep. Patient attended the AA meeting this evening. Patient interaction appropriate with the others. Patient compliant with medication. No acute distress noted at this time. Will continue to monitor for safety. ?
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
## 2022-01-27 NOTE — Progress Notes (Signed)
Patient asleep with even and  unlabored respirations staff will continue to monitor. ?

## 2022-01-27 NOTE — Progress Notes (Signed)
Patient states nausea is better due to earlier medication given. Staff will continue to monitor. ?

## 2022-01-27 NOTE — ED Notes (Signed)
Snacks given 

## 2022-01-27 NOTE — ED Notes (Signed)
Snack given.

## 2022-01-27 NOTE — ED Notes (Signed)
Pt sleeping@this time. Breathing even and unlabored. Will continue to monitor for safety 

## 2022-01-28 DIAGNOSIS — Z20822 Contact with and (suspected) exposure to covid-19: Secondary | ICD-10-CM | POA: Diagnosis not present

## 2022-01-28 DIAGNOSIS — F319 Bipolar disorder, unspecified: Secondary | ICD-10-CM | POA: Diagnosis not present

## 2022-01-28 DIAGNOSIS — F419 Anxiety disorder, unspecified: Secondary | ICD-10-CM | POA: Diagnosis not present

## 2022-01-28 DIAGNOSIS — F1020 Alcohol dependence, uncomplicated: Secondary | ICD-10-CM | POA: Diagnosis not present

## 2022-01-28 MED ORDER — ONDANSETRON 4 MG PO TBDP
4.0000 mg | ORAL_TABLET | Freq: Three times a day (TID) | ORAL | Status: DC | PRN
  Administered 2022-01-28: 4 mg via ORAL

## 2022-01-28 MED ORDER — MAGNESIUM HYDROXIDE 400 MG/5ML PO SUSP: 30.0000 mL | Freq: Every day | ORAL | Status: DC | PRN

## 2022-01-28 MED ORDER — ONDANSETRON 4 MG PO TBDP
ORAL_TABLET | ORAL | Status: AC
  Filled 2022-01-28: qty 1

## 2022-01-28 NOTE — ED Notes (Signed)
Patient expressed in wrap up group that she wants outpatient treatment once she is discharged from the hospital. Patient also communicated in group that she has issues with structure while attending treatment facilities. Staff process with patient about following rules while in treatment facilities is apart of character building and becoming clean and sober. Patient took staff advice and said she will utilize it once she is discharge. ?

## 2022-01-28 NOTE — Medical Student Note (Signed)
Behavioral Health Progress Note  Date and Time: 01/28/2022 10:48 AM Name: Sara [name] MRN:  [id number]  Subjective:   Sara [name] is a 46 yo female w PMH significant for bipolar disorder, anxiety, PTSD, misuse of benzodiazepines,and alcohol use disorder who was admitted to the [facility] on 01/24/2022 requesting alcohol treatment. Etoh 201; UDS +bzd.    Patient seen and chart reviewed. Patient has been medication compliant and has been appropriate with staff and peers on the unit; most recent CIWA 1. Patient is tolerating Ativan taper well. She reports her mood as "good" today. She denies SI/HI/AVH and paranoia. She reports nausea and constipation this morning and denies vomiting and tremors. Patient reports she slept well last night after restarting Seroquel. She reports her anxiety is 8 or 9/10 today. She is anxious about her relationship with her husband, and her future. She went to the [place] meeting yesterday and reported it was a good experience.  Diagnosis:  Final diagnoses:  Bipolar 1 disorder ([place])  Alcohol use disorder, severe, dependence ([place])    Total Time spent with patient: 15 minutes  Past Psychiatric History: AUD, severe; Bipolar disorder; MDD; SIMD Past Medical History:  Past Medical History:  Diagnosis Date   Alcohol abuse    Alcoholism ([place])    Anemia    Anxiety    Blood transfusion without reported diagnosis    Cirrhosis ([place])    Depression    Esophageal varices with bleeding(456.0) 06/13/2014   GERD (gastroesophageal reflux disease)    Heart murmur    Patient states she may have   Menorrhagia    Pancytopenia ([place]) 01/15/2014   Pneumonia    Portal hypertension ([place])    S/P alcohol detoxification    2-3 days at behavioral health previously   UGI bleed 06/12/2014    Past Surgical History:  Procedure Laterality Date   CHOLECYSTECTOMY     ESOPHAGOGASTRODUODENOSCOPY N/A 06/12/2014   Procedure: ESOPHAGOGASTRODUODENOSCOPY (EGD);  Surgeon: Gatha Mayer, MD;   Location: [facility];  Service: Endoscopy;  Laterality: N/A;   ESOPHAGOGASTRODUODENOSCOPY (EGD) WITH PROPOFOL N/A 07/29/2014   Procedure: ESOPHAGOGASTRODUODENOSCOPY (EGD) WITH PROPOFOL;  Surgeon: Inda Castle, MD;  Location: [facility];  Service: Endoscopy;  Laterality: N/A;   ESOPHAGOGASTRODUODENOSCOPY (EGD) WITH PROPOFOL N/A 01/20/2018   Procedure: ESOPHAGOGASTRODUODENOSCOPY (EGD) WITH PROPOFOL;  Surgeon: Mauri Pole, MD;  Location: [facility];  Service: Endoscopy;  Laterality: N/A;   Family History:  Family History  Problem Relation Age of Onset   Colon polyps Mother    Hypertension Mother    Thyroid disease Mother    Alcoholism Mother    Alcoholism Father    Alcohol abuse Maternal Grandfather    Alcohol abuse Paternal Grandfather    Alcohol abuse Paternal Aunt    Alcohol abuse Maternal Uncle    Family Psychiatric  History: per chart, mother, father, MGF and PGF, aunt and uncle with alcohol abuse  Social History:  Social History   Substance and Sexual Activity  Alcohol Use Yes   Alcohol/week: 0.0 standard drinks   Comment: Usually drinks 2-3 bottles of wine daily when drinking.      Social History   Substance and Sexual Activity  Drug Use No   Types: Cocaine, Marijuana   Comment: denies    Social History   Socioeconomic History   Marital status: Married    Spouse name: [name]   Number of children: 2   Years of education: Associates   Highest education level: Not on file  Occupational  History   Occupation: Paralegal  Tobacco Use   Smoking status: Former    Packs/day: 0.25    Types: Cigarettes    Quit date: 11/22/2018    Years since quitting: 3.1   Smokeless tobacco: Never  Substance and Sexual Activity   Alcohol use: Yes    Alcohol/week: 0.0 standard drinks    Comment: Usually drinks 2-3 bottles of wine daily when drinking.    Drug use: No    Types: Cocaine, Marijuana    Comment: denies   Sexual activity: Never    Birth control/protection:  None  Other Topics Concern   Not on file  Social History Narrative   Lives with husband and son. Daughter lives nearby. 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   Social Determinants of Health   Financial Resource Strain: Not on file  Food Insecurity: Not on file  Transportation Needs: Not on file  Physical Activity: Not on file  Stress: Not on file  Social Connections: Not on file   SDOH:  SDOH Screenings   Alcohol Screen: Not on file  Depression (PHQ2-9): Medium Risk   PHQ-2 Score: 16  Financial Resource Strain: Not on file  Food Insecurity: Not on file  Housing: Not on file  Physical Activity: Not on file  Social Connections: Not on file  Stress: Not on file  Tobacco Use: Medium Risk   Smoking Tobacco Use: Former   Smokeless Tobacco Use: Never   Passive Exposure: Not on file  Transportation Needs: Not on file   Additional Social History:    Pain Medications: See MAR Prescriptions: See MAR Over the Counter: See MAR History of alcohol / drug use?: Yes Longest period of sobriety (when/how long): 9 months in 2017 Negative Consequences of Use: Personal relationships, Work / Youth worker Withdrawal Symptoms: DTs, Sweats, Tremors, Blackouts, Agitation, Irritability Name of Substance 1: Alcohol 1 - Age of First Use: 25 1 - Amount (size/oz): Approximately 1 fifth of vodka 1 - Frequency: Daily 1 - Duration: Ongoing 1 - Last Use / Amount: 01/24/2022 1 - Method of Aquiring: Store 1- Route of Use: Oral ingestion                  Sleep: Good  Appetite:  Fair, decreased due to nausea  Current Medications:  Current Facility-Administered Medications  Medication Dose Route Frequency Provider Last Rate Last Admin   escitalopram (LEXAPRO) tablet 40 mg  40 mg Oral Daily Evette Georges, NP   40 mg at 01/28/22 0916   gabapentin (NEURONTIN) capsule 200 mg  200 mg Oral TID Evette Georges, NP   200 mg at 01/28/22 0916   hydrOXYzine (ATARAX) tablet 25 mg  25  mg Oral TID PRN Evette Georges, NP   25 mg at 01/28/22 0916   [START ON 01/29/2022] LORazepam (ATIVAN) tablet 1 mg  1 mg Oral Daily Evette Georges, NP       multivitamin with minerals tablet 1 tablet  1 tablet Oral Daily Evette Georges, NP   1 tablet at 01/28/22 0916   polyethylene glycol (MIRALAX / GLYCOLAX) packet 17 g  17 g Oral Daily PRN Ival Bible, MD   17 g at 01/27/22 1009   QUEtiapine (SEROQUEL) tablet 100 mg  100 mg Oral QHS Ival Bible, MD   100 mg at 01/27/22 2143   thiamine tablet 100 mg  100 mg Oral Daily Evette Georges, NP   100 mg at 01/28/22 0916   topiramate (TOPAMAX) tablet 100  mg  100 mg Oral BID Evette Georges, NP   100 mg at 01/28/22 1751   Current Outpatient Medications  Medication Sig Dispense Refill   escitalopram (LEXAPRO) 20 MG tablet Take 2 tablets (40 mg total) by mouth daily. 60 tablet 1   gabapentin (NEURONTIN) 100 MG capsule Take 2 capsules (200 mg total) by mouth 3 (three) times daily. 180 capsule 1   Multiple Vitamin (MULTIVITAMIN WITH MINERALS) TABS tablet Take 1 tablet by mouth daily.     QUEtiapine (SEROQUEL) 100 MG tablet Take 1 tablet (100 mg total) by mouth at bedtime. 30 tablet 1   topiramate (TOPAMAX) 100 MG tablet Take 1 tablet (100 mg total) by mouth 2 (two) times daily. 60 tablet 1    Labs  Lab Results:  Admission on 01/24/2022  Component Date Value Ref Range Status   SARS Coronavirus 2 by RT PCR 01/24/2022 NEGATIVE  NEGATIVE Final   Comment: (NOTE) SARS-CoV-2 target nucleic acids are NOT DETECTED.  The SARS-CoV-2 RNA is generally detectable in upper respiratory specimens during the acute phase of infection. The lowest concentration of SARS-CoV-2 viral copies this assay can detect is 138 copies/mL. A negative result does not preclude SARS-Cov-2 infection and should not be used as the sole basis for treatment or other patient management decisions. A negative result may occur with  improper specimen collection/handling, submission  of specimen other than nasopharyngeal swab, presence of viral mutation(s) within the areas targeted by this assay, and inadequate number of viral copies(<138 copies/mL). A negative result must be combined with clinical observations, patient history, and epidemiological information. The expected result is Negative.  Fact Sheet for Patients:  EntrepreneurPulse.com.au  Fact Sheet for Healthcare Providers:  IncredibleEmployment.be  This test is no                          t yet approved or cleared by the [place] FDA and  has been authorized for detection and/or diagnosis of SARS-CoV-2 by FDA under an Emergency Use Authorization (EUA). This EUA will remain  in effect (meaning this test can be used) for the duration of the COVID-19 declaration under Section 564(b)(1) of the Act, 21 U.S.C.section 360bbb-3(b)(1), unless the authorization is terminated  or revoked sooner.       Influenza A by PCR 01/24/2022 NEGATIVE  NEGATIVE Final   Influenza B by PCR 01/24/2022 NEGATIVE  NEGATIVE Final   Comment: (NOTE) The Xpert Xpress SARS-CoV-2/FLU/RSV plus assay is intended as an aid in the diagnosis of influenza from Nasopharyngeal swab specimens and should not be used as a sole basis for treatment. Nasal washings and aspirates are unacceptable for Xpert Xpress SARS-CoV-2/FLU/RSV testing.  Fact Sheet for Patients: EntrepreneurPulse.com.au  Fact Sheet for Healthcare Providers: IncredibleEmployment.be  This test is not yet approved or cleared by the [place] FDA and has been authorized for detection and/or diagnosis of SARS-CoV-2 by FDA under an Emergency Use Authorization (EUA). This EUA will remain in effect (meaning this test can be used) for the duration of the COVID-19 declaration under Section 564(b)(1) of the Act, 21 U.S.C. section 360bbb-3(b)(1), unless the authorization is terminated  or revoked.  Performed at [facility], [place] [address], [place], [place] [address]    SARS Coronavirus 2 Ag 01/24/2022 Negative  Negative Preliminary   WBC 01/24/2022 4.0  4.0 - 10.5 K/uL Final   RBC 01/24/2022 3.79 (L)  3.87 - 5.11 MIL/uL Final   Hemoglobin 01/24/2022 11.4 (L)  12.0 - 15.0 g/dL Final   HCT 01/24/2022 34.4 (L)  36.0 - 46.0 % Final   MCV 01/24/2022 90.8  80.0 - 100.0 fL Final   MCH 01/24/2022 30.1  26.0 - 34.0 pg Final   MCHC 01/24/2022 33.1  30.0 - 36.0 g/dL Final   RDW 01/24/2022 16.0 (H)  11.5 - 15.5 % Final   Platelets 01/24/2022 99 (L)  150 - 400 K/uL Final   Comment: Immature Platelet Fraction may be clinically indicated, consider ordering this additional test [id number] CONSISTENT WITH PREVIOUS RESULT REPEATED TO VERIFY    nRBC 01/24/2022 0.0  0.0 - 0.2 % Final   Neutrophils Relative % 01/24/2022 49  % Final   Neutro Abs 01/24/2022 2.0  1.7 - 7.7 K/uL Final   Lymphocytes Relative 01/24/2022 40  % Final   Lymphs Abs 01/24/2022 1.6  0.7 - 4.0 K/uL Final   Monocytes Relative 01/24/2022 9  % Final   Monocytes Absolute 01/24/2022 0.4  0.1 - 1.0 K/uL Final   Eosinophils Relative 01/24/2022 1  % Final   Eosinophils Absolute 01/24/2022 0.1  0.0 - 0.5 K/uL Final   Basophils Relative 01/24/2022 1  % Final   Basophils Absolute 01/24/2022 0.0  0.0 - 0.1 K/uL Final   Immature Granulocytes 01/24/2022 0  % Final   Abs Immature Granulocytes 01/24/2022 0.01  0.00 - 0.07 K/uL Final   Performed at [facility], [place] [address] [place], [place] [address]   Sodium 01/24/2022 136  135 - 145 mmol/L Final   Potassium 01/24/2022 2.8 (L)  3.5 - 5.1 mmol/L Final   Chloride 01/24/2022 109  98 - 111 mmol/L Final   CO2 01/24/2022 17 (L)  22 - 32 mmol/L Final   Glucose, Bld 01/24/2022 90  70 - 99 mg/dL Final   Glucose reference range applies only to samples taken after fasting for at least 8 hours.   BUN 01/24/2022 6  6 - 20 mg/dL Final   Creatinine, Ser 01/24/2022 0.68   0.44 - 1.00 mg/dL Final   Calcium 01/24/2022 8.2 (L)  8.9 - 10.3 mg/dL Final   Total Protein 01/24/2022 7.0  6.5 - 8.1 g/dL Final   Albumin 01/24/2022 3.3 (L)  3.5 - 5.0 g/dL Final   AST 01/24/2022 36  15 - 41 U/L Final   ALT 01/24/2022 22  0 - 44 U/L Final   Alkaline Phosphatase 01/24/2022 77  38 - 126 U/L Final   Total Bilirubin 01/24/2022 0.9  0.3 - 1.2 mg/dL Final   GFR, Estimated 01/24/2022 >60  >60 mL/min Final   Comment: (NOTE) Calculated using the CKD-EPI Creatinine Equation (2021)    Anion gap 01/24/2022 10  5 - 15 Final   Performed at [facility], [place] [address], [place], [place] [address]   Hgb A1c MFr Bld 01/24/2022 4.7 (L)  4.8 - 5.6 % Final   Comment: (NOTE) Pre diabetes:          5.7%-6.4%  Diabetes:              >6.4%  Glycemic control for   <7.0% adults with diabetes    Mean Plasma Glucose 01/24/2022 88.19  mg/dL Final   Performed at [facility], [place] [address] [place], [place] [address]   Alcohol, Ethyl (B) 01/24/2022 201 (H)  <10 mg/dL Final   Comment: (NOTE) Lowest detectable limit for serum alcohol is 10 mg/dL.  For medical purposes only. Performed at [facility],  [address], [place], [place] [address]    TSH 01/24/2022 3.012  0.350 - 4.500 uIU/mL Final   Comment: Performed by a 3rd Generation assay with a functional sensitivity of <=0.01 uIU/mL. Performed at [facility], [place] [address] [place], [place] [address]    Color, Urine 01/24/2022 YELLOW  YELLOW Final   APPearance 01/24/2022 CLEAR  CLEAR Final   Specific Gravity, Urine 01/24/2022 1.006  1.005 - 1.030 Final   pH 01/24/2022 7.0  5.0 - 8.0 Final   Glucose, UA 01/24/2022 NEGATIVE  NEGATIVE mg/dL Final   Hgb urine dipstick 01/24/2022 NEGATIVE  NEGATIVE Final   Bilirubin Urine 01/24/2022 NEGATIVE  NEGATIVE Final   Ketones, ur 01/24/2022 NEGATIVE  NEGATIVE mg/dL Final   Protein, ur 01/24/2022 NEGATIVE  NEGATIVE mg/dL Final   Nitrite 01/24/2022 NEGATIVE  NEGATIVE  Final   Leukocytes,Ua 01/24/2022 NEGATIVE  NEGATIVE Final   Performed at [facility], [place] [address], [place], [place] [address]   Preg Test, Ur 01/24/2022 NEGATIVE  NEGATIVE Final   Comment:        THE SENSITIVITY OF THIS METHODOLOGY IS >20 mIU/mL. Performed at [facility], [place] [address] [place], [place] [address]    POC Amphetamine UR 01/24/2022 None Detected  NONE DETECTED (Cut Off Level 1000 ng/mL) Final   POC Secobarbital (BAR) 01/24/2022 None Detected  NONE DETECTED (Cut Off Level 300 ng/mL) Final   POC Buprenorphine (BUP) 01/24/2022 None Detected  NONE DETECTED (Cut Off Level 10 ng/mL) Final   POC Oxazepam (BZO) 01/24/2022 Positive (A)  NONE DETECTED (Cut Off Level 300 ng/mL) Final   POC Cocaine UR 01/24/2022 None Detected  NONE DETECTED (Cut Off Level 300 ng/mL) Final   POC Methamphetamine UR 01/24/2022 None Detected  NONE DETECTED (Cut Off Level 1000 ng/mL) Final   POC Morphine 01/24/2022 None Detected  NONE DETECTED (Cut Off Level 300 ng/mL) Final   POC Oxycodone UR 01/24/2022 None Detected  NONE DETECTED (Cut Off Level 100 ng/mL) Final   POC Methadone UR 01/24/2022 None Detected  NONE DETECTED (Cut Off Level 300 ng/mL) Final   POC Marijuana UR 01/24/2022 None Detected  NONE DETECTED (Cut Off Level 50 ng/mL) Final   Preg Test, Ur 01/24/2022 NEGATIVE  NEGATIVE Final   Comment:        THE SENSITIVITY OF THIS METHODOLOGY IS >24 mIU/mL    SARSCOV2ONAVIRUS 2 AG 01/24/2022 NEGATIVE  NEGATIVE Final   Comment: (NOTE) SARS-CoV-2 antigen NOT DETECTED.   Negative results are presumptive.  Negative results do not preclude SARS-CoV-2 infection and should not be used as the sole basis for treatment or other patient management decisions, including infection  control decisions, particularly in the presence of clinical signs and  symptoms consistent with COVID-19, or in those who have been in contact with the virus.  Negative results must be combined with clinical  observations, patient history, and epidemiological information. The expected result is Negative.  Fact Sheet for Patients: HandmadeRecipes.com.cy  Fact Sheet for Healthcare Providers: FuneralLife.at  This test is not yet approved or cleared by the [place] FDA and  has been authorized for detection and/or diagnosis of SARS-CoV-2 by FDA under an Emergency Use Authorization (EUA).  This EUA will remain in effect (meaning this test can be used) for the duration of  the COV                          ID-19 declaration under  Section 564(b)(1) of the Act, 21 U.S.C. section 360bbb-3(b)(1), unless the authorization is terminated or revoked sooner.     Cholesterol 01/24/2022 137  0 - 200 mg/dL Final   Triglycerides 01/24/2022 189 (H)  <150 mg/dL Final   HDL 01/24/2022 32 (L)  >40 mg/dL Final   Total CHOL/HDL Ratio 01/24/2022 4.3  RATIO Final   VLDL 01/24/2022 38  0 - 40 mg/dL Final   LDL Cholesterol 01/24/2022 67  0 - 99 mg/dL Final   Comment:        Total Cholesterol/HDL:CHD Risk Coronary Heart Disease Risk Table                     Men   Women  1/2 Average Risk   3.4   3.3  Average Risk       5.0   4.4  2 X Average Risk   9.6   7.1  3 X Average Risk  23.4   11.0        Use the calculated Patient Ratio above and the CHD Risk Table to determine the patient's CHD Risk.        ATP III CLASSIFICATION (LDL):  <100     mg/dL   Optimal  100-129  mg/dL   Near or Above                    Optimal  130-159  mg/dL   Borderline  160-189  mg/dL   High  >190     mg/dL   Very High Performed at [facility] [address], [place], [place] [address]    Sodium 01/25/2022 139  135 - 145 mmol/L Final   Potassium 01/25/2022 4.0  3.5 - 5.1 mmol/L Final   NO VISIBLE HEMOLYSIS   Chloride 01/25/2022 114 (H)  98 - 111 mmol/L Final   CO2 01/25/2022 19 (L)  22 - 32 mmol/L Final   Glucose, Bld 01/25/2022 83  70 - 99 mg/dL Final   Glucose  reference range applies only to samples taken after fasting for at least 8 hours.   BUN 01/25/2022 7  6 - 20 mg/dL Final   Creatinine, Ser 01/25/2022 0.53  0.44 - 1.00 mg/dL Final   Calcium 01/25/2022 8.4 (L)  8.9 - 10.3 mg/dL Final   Total Protein 01/25/2022 6.7  6.5 - 8.1 g/dL Final   Albumin 01/25/2022 3.1 (L)  3.5 - 5.0 g/dL Final   AST 01/25/2022 33  15 - 41 U/L Final   ALT 01/25/2022 19  0 - 44 U/L Final   Alkaline Phosphatase 01/25/2022 70  38 - 126 U/L Final   Total Bilirubin 01/25/2022 0.8  0.3 - 1.2 mg/dL Final   GFR, Estimated 01/25/2022 >60  >60 mL/min Final   Comment: (NOTE) Calculated using the CKD-EPI Creatinine Equation (2021)    Anion gap 01/25/2022 6  5 - 15 Final   Performed at [facility] [address], [place], [place] [address]  Admission on 10/07/2021, Discharged on 10/11/2021  Component Date Value Ref Range Status   POC Amphetamine UR 10/07/2021 None Detected  NONE DETECTED (Cut Off Level 1000 ng/mL) Final   POC Secobarbital (BAR) 10/07/2021 None Detected  NONE DETECTED (Cut Off Level 300 ng/mL) Final   POC Buprenorphine (BUP) 10/07/2021 None Detected  NONE DETECTED (Cut Off Level 10 ng/mL) Final   POC Oxazepam (BZO) 10/07/2021 Positive (A)  NONE DETECTED (Cut Off Level 300 ng/mL) Final  POC Cocaine UR 10/07/2021 None Detected  NONE DETECTED (Cut Off Level 300 ng/mL) Final   POC Methamphetamine UR 10/07/2021 None Detected  NONE DETECTED (Cut Off Level 1000 ng/mL) Final   POC Morphine 10/07/2021 None Detected  NONE DETECTED (Cut Off Level 300 ng/mL) Final   POC Oxycodone UR 10/07/2021 None Detected  NONE DETECTED (Cut Off Level 100 ng/mL) Final   POC Methadone UR 10/07/2021 None Detected  NONE DETECTED (Cut Off Level 300 ng/mL) Final   POC Marijuana UR 10/07/2021 None Detected  NONE DETECTED (Cut Off Level 50 ng/mL) Final  Admission on 10/06/2021, Discharged on 10/07/2021  Component Date Value Ref Range Status   Lipase 10/06/2021 93 (H)  11 - 51 U/L  Final   Performed at [facility], [place] [address], [place], [place] [address]   Sodium 10/06/2021 140  135 - 145 mmol/L Final   Potassium 10/06/2021 3.3 (L)  3.5 - 5.1 mmol/L Final   Chloride 10/06/2021 110  98 - 111 mmol/L Final   CO2 10/06/2021 20 (L)  22 - 32 mmol/L Final   Glucose, Bld 10/06/2021 119 (H)  70 - 99 mg/dL Final   Glucose reference range applies only to samples taken after fasting for at least 8 hours.   BUN 10/06/2021 9  6 - 20 mg/dL Final   Creatinine, Ser 10/06/2021 0.52  0.44 - 1.00 mg/dL Final   Calcium 10/06/2021 8.4 (L)  8.9 - 10.3 mg/dL Final   Total Protein 10/06/2021 7.2  6.5 - 8.1 g/dL Final   Albumin 10/06/2021 3.1 (L)  3.5 - 5.0 g/dL Final   AST 10/06/2021 49 (H)  15 - 41 U/L Final   ALT 10/06/2021 24  0 - 44 U/L Final   Alkaline Phosphatase 10/06/2021 88  38 - 126 U/L Final   Total Bilirubin 10/06/2021 0.8  0.3 - 1.2 mg/dL Final   GFR, Estimated 10/06/2021 >60  >60 mL/min Final   Comment: (NOTE) Calculated using the CKD-EPI Creatinine Equation (2021)    Anion gap 10/06/2021 10  5 - 15 Final   Performed at [facility], [place] [address] [place], [place] [address]   WBC 10/06/2021 2.2 (L)  4.0 - 10.5 K/uL Final   RBC 10/06/2021 3.93  3.87 - 5.11 MIL/uL Final   Hemoglobin 10/06/2021 11.4 (L)  12.0 - 15.0 g/dL Final   HCT 10/06/2021 35.1 (L)  36.0 - 46.0 % Final   MCV 10/06/2021 89.3  80.0 - 100.0 fL Final   MCH 10/06/2021 29.0  26.0 - 34.0 pg Final   MCHC 10/06/2021 32.5  30.0 - 36.0 g/dL Final   RDW 10/06/2021 17.4 (H)  11.5 - 15.5 % Final   Platelets 10/06/2021 76 (L)  150 - 400 K/uL Final   Comment: SPECIMEN CHECKED FOR CLOTS Immature Platelet Fraction may be clinically indicated, consider ordering this additional test [id number] REPEATED TO VERIFY PLATELET COUNT CONFIRMED BY SMEAR    nRBC 10/06/2021 0.0  0.0 - 0.2 % Final   Performed at [facility], [place] [address] [place], [place]  [address]   Color, Urine 10/07/2021 YELLOW  YELLOW Final   APPearance 10/07/2021 CLOUDY (A)  CLEAR Final   Specific Gravity, Urine 10/07/2021 1.011  1.005 - 1.030 Final   pH 10/07/2021 5.0  5.0 - 8.0 Final   Glucose, UA 10/07/2021 NEGATIVE  NEGATIVE mg/dL Final   Hgb urine dipstick 10/07/2021 SMALL (A)  NEGATIVE Final   Bilirubin Urine 10/07/2021 NEGATIVE  NEGATIVE  Final   Ketones, ur 10/07/2021 5 (A)  NEGATIVE mg/dL Final   Protein, ur 10/07/2021 NEGATIVE  NEGATIVE mg/dL Final   Nitrite 10/07/2021 NEGATIVE  NEGATIVE Final   Leukocytes,Ua 10/07/2021 LARGE (A)  NEGATIVE Final   RBC / HPF 10/07/2021 6-10  0 - 5 RBC/hpf Final   WBC, UA 10/07/2021 11-20  0 - 5 WBC/hpf Final   Bacteria, UA 10/07/2021 MANY (A)  NONE SEEN Final   Squamous Epithelial / LPF 10/07/2021 11-20  0 - 5 Final   Mucus 10/07/2021 PRESENT   Final   Performed at [facility] [address] [place], [place] [address]   I-stat hCG, quantitative 10/06/2021 <5.0  <5 mIU/mL Final   Comment 3 10/06/2021          Final   Comment:   GEST. AGE      CONC.  (mIU/mL)   <=1 WEEK        5 - 50     2 WEEKS       50 - 500     3 WEEKS       100 - 10,000     4 WEEKS     1,000 - 30,000        FEMALE AND NON-PREGNANT FEMALE:     LESS THAN 5 mIU/mL    Alcohol, Ethyl (B) 10/06/2021 285 (H)  <10 mg/dL Final   Comment: (NOTE) Lowest detectable limit for serum alcohol is 10 mg/dL.  For medical purposes only. Performed at [facility], [place] [address], [place], [place] [address]    SARS Coronavirus 2 by RT PCR 10/07/2021 NEGATIVE  NEGATIVE Final   Comment: (NOTE) SARS-CoV-2 target nucleic acids are NOT DETECTED.  The SARS-CoV-2 RNA is generally detectable in upper respiratory specimens during the acute phase of infection. The lowest concentration of SARS-CoV-2 viral copies this assay can detect is 138 copies/mL. A negative result does not preclude SARS-Cov-2 infection and should not be used as the  sole basis for treatment or other patient management decisions. A negative result may occur with  improper specimen collection/handling, submission of specimen other than nasopharyngeal swab, presence of viral mutation(s) within the areas targeted by this assay, and inadequate number of viral copies(<138 copies/mL). A negative result must be combined with clinical observations, patient history, and epidemiological information. The expected result is Negative.  Fact Sheet for Patients:  EntrepreneurPulse.com.au  Fact Sheet for Healthcare Providers:  IncredibleEmployment.be  This test is no                          t yet approved or cleared by the [place] FDA and  has been authorized for detection and/or diagnosis of SARS-CoV-2 by FDA under an Emergency Use Authorization (EUA). This EUA will remain  in effect (meaning this test can be used) for the duration of the COVID-19 declaration under Section 564(b)(1) of the Act, 21 U.S.C.section 360bbb-3(b)(1), unless the authorization is terminated  or revoked sooner.       Influenza A by PCR 10/07/2021 NEGATIVE  NEGATIVE Final   Influenza B by PCR 10/07/2021 NEGATIVE  NEGATIVE Final   Comment: (NOTE) The Xpert Xpress SARS-CoV-2/FLU/RSV plus assay is intended as an aid in the diagnosis of influenza from Nasopharyngeal swab specimens and should not be used as a sole basis for treatment. Nasal washings and aspirates are unacceptable for Xpert Xpress SARS-CoV-2/FLU/RSV testing.  Fact Sheet for Patients: EntrepreneurPulse.com.au  Fact Sheet  for Healthcare Providers: IncredibleEmployment.be  This test is not yet approved or cleared by the [place] and has been authorized for detection and/or diagnosis of SARS-CoV-2 by FDA under an Emergency Use Authorization (EUA). This EUA will remain in effect (meaning this test can be used) for the duration of  the COVID-19 declaration under Section 564(b)(1) of the Act, 21 U.S.C. section 360bbb-3(b)(1), unless the authorization is terminated or revoked.  Performed at [facility], [place] [address] [place], [place] [address]     Blood Alcohol level:  Lab Results  Component Value Date   ETH 201 (H) 01/24/2022   ETH 285 (H) 61/60/7371    Metabolic Disorder Labs: Lab Results  Component Value Date   HGBA1C 4.7 (L) 01/24/2022   MPG 88.19 01/24/2022   MPG 102.54 01/20/2021   Lab Results  Component Value Date   PROLACTIN 28.7 (H) 05/04/2017   Lab Results  Component Value Date   CHOL 137 01/24/2022   TRIG 189 (H) 01/24/2022   HDL 32 (L) 01/24/2022   CHOLHDL 4.3 01/24/2022   VLDL 38 01/24/2022   LDLCALC 67 01/24/2022   LDLCALC 83 01/20/2021    Therapeutic Lab Levels: No results found for: LITHIUM No results found for: VALPROATE No components found for:  CBMZ  Physical Findings   AIMS    Flowsheet Row Admission (Discharged) from 04/19/2019 in [place] [facility] Admission (Discharged) from 05/02/2017 in [place] [facility]  AIMS Total Score 0 0      AUDIT    Flowsheet Row Admission (Discharged) from 04/19/2019 in [place] [facility] Admission (Discharged) from 05/02/2017 in [place] [facility] Admission (Discharged) from 12/22/2014 in [place] [facility] Admission (Discharged) from 02/02/2014 in [place] [facility] Admission (Discharged) from 09/27/2013 in [place] [facility]  Alcohol Use Disorder Identification Test Final Score (AUDIT) 34 34 29 38 34      GAD-7    Flowsheet Row Video Visit from 11/09/2021 in [facility] Video Visit from 07/08/2021 in [facility] Video Visit from 05/13/2021 in [facility] Video Visit from 04/02/2021 in [facility] Video Visit from 02/17/2021 in [facility]  Total GAD-7 Score '16 18 16 15 11      '$ PHQ2-9    Flowsheet Row Video Visit from 11/09/2021 in [facility] ED from 10/06/2021 in [place] DE[facility] Video Visit from 07/08/2021 in [facility] Video Visit from 05/13/2021 in [facility] Video Visit from 04/02/2021 in [place]  PHQ-2 Total Score '2 4 2 2 3  '$ PHQ-9 Total Score '16 15 9 10 15      '$ [place] [facility] from 01/24/2022 in [facility] ED from 01/01/2022 in [facility] [facility] Video Visit from 11/09/2021 in [place] No Risk No Risk No Risk        Musculoskeletal  Strength & Muscle Tone: within normal limits Gait & Station: normal Patient leans: N/A  Psychiatric Specialty Exam  Presentation  General Appearance: Appropriate for Environment, Casual  Eye Contact:Good  Speech:Clear and Coherent; Normal Rate  Speech Volume: Soft Handedness:Right   Mood and Affect  Mood: "good" Affect: Appropriate, Congruent  Thought Process  Thought  Processes:Coherent; Goal Directed; Linear  Descriptions of Associations:Intact  Orientation:Full (Time, Place and Person)  Thought Content:WDL; Logical  Diagnosis of Schizophrenia or Schizoaffective disorder in past: No    Hallucinations:Hallucinations: None  Ideas of Reference:None  Suicidal Thoughts:Suicidal Thoughts: No  Homicidal Thoughts:Homicidal Thoughts: No   Sensorium  Memory:Immediate Good; Recent Good; Remote Good  Judgment:Fair  Insight:Fair   Executive Functions  Concentration:Good  Attention Span:Good  [place] of Knowledge:Good  Language:Good   Psychomotor  Activity  Psychomotor Activity:Psychomotor Activity: Normal   Assets  Assets:Communication Skills; Desire for Improvement; Resilience   Sleep  Sleep: Good  No data recorded  Physical Exam  Physical Exam Constitutional:      Appearance: Normal appearance.  HENT:     Head: Normocephalic and atraumatic.  Pulmonary:     Effort: Pulmonary effort is normal.  Neurological:     Mental Status: She is alert and oriented to person, place, and time.    Review of Systems  Constitutional:  Negative for chills and fever.  HENT:  Negative for hearing loss.   Eyes:  Negative for discharge and redness.  Respiratory:  Negative for cough.   Cardiovascular:  Negative for chest pain.  Gastrointestinal:  Positive for nausea. Positive for constipation. Negative for abdominal pain and vomiting.  Musculoskeletal:  Negative for myalgias.  Neurological:  Negative for headaches.  Psychiatric/Behavioral:  Positive for depression and substance abuse. Negative for hallucinations and suicidal ideas.   Blood pressure 108/61, pulse 78, temperature 97.8 F (36.6 C), temperature source Oral, resp. rate 18, SpO2 96 %. There is no height or weight on file to calculate BMI.  Treatment Plan Summary: Miko Markwood is a 46 yo female w PMH significant for bipolar disorder, anxiety, PTSD, misuse of benzodiazepines,and alcohol use disorder who was admitted to the [facility] on 01/24/2022 requesting alcohol treatment. Etoh 201; UDS +bzd.    Patient tolerating Ativan taper well. Most recent CIWA 1. Patient denies SI/HI/AH. She reports nausea and constipation today. Denies abdominal pain, tremors, and diaphoresis. Patient remains appropriate for continued treatment at the [facility] for alcohol detox and continued crisis stabilization.  Substance induced mood disorder Alcohol Use Disorder, severe Hx of Complicated Withdrawals -continue CIWA protocol -continue ativan detox- 1 mg QID --> 1 mg TID--> 1 mg BID--> 1 mg  daily -multivitamin -thiamine -continue Gabapentin '200mg'$  TID   Bipolar Disorder -Continue home Seroquel 100 mg  -Continue home Topamax 100 BID -Continue home Lexapro 40 mg daily   Hypokalemia, resolved -2.9 K on presentation (01/24/2022) -received 40 meq x 2 doses with repeat K 4.0 (01/25/2022)   Dispo: Ongoing.  Social work Sports administrator.  Plan to transfer to alcohol treatment facility after completion of Ativan taper (ends Saturday morning). SW confirmed with attorney that court date is pushed back.  Leonette Nutting, Medical Student 01/28/2022 10:48 AM

## 2022-01-28 NOTE — ED Notes (Signed)
Pt is currently sleeping, no distress noted, environmental check complete, will continue to monitor patient for safety. ? ?

## 2022-01-28 NOTE — ED Provider Notes (Signed)
Behavioral Health Progress Note  Date and Time: 01/28/2022 12:44 PM Name: Sara [name] MRN:  [id number]  Subjective:   Sara [name] is a 46 yo female w PMH significant for bipolar disorder, anxiety, PTSD, misuse of benzodiazepines,and alcohol use disorder who was admitted to the [facility] on 01/24/2022 requesting alcohol treatment. Etoh 201; UDS +bzd.    Patient seen and chart reviewed-patient has been medication compliant and has been appropriate with staff and peers on the unit; most recent CIWA 1.  Patient tolerating Ativan taper well.  Patient observed participating in group this morning. Patient is interviewed in her room this morning after speaking with medical student. She describes her mood as "good" although reports feeling anxious. Patient goes on to describe experiencing high anxiety in the morning which dissipates throughout the day. She indicates that it is "normal" for her to be more anxious while detoxing. She denies alcohol withdrawal symptoms of nausea, vomiting, tremors, GI upset and diarrhea. She reports ongoing constipation and requests medication. She denies SI/HI/AVH. She rates her current mood as 5/10 (10 being the best) and states that her mood has improved during her stay at the [facility] and states that her mood was 0/10 when she arrived.  Informed patient that her lawyer has received letter and that her court date will be postponed and that the plan is to discharge her to residential rehab on Monday. Patient verbalizes understanding and expresses that she is looking forward to going to treatment.    Diagnosis:  Final diagnoses:  Bipolar 1 disorder ([place])  Alcohol use disorder, severe, dependence ([place])    Total Time spent with patient: 15 minutes  Past Psychiatric History: AUD, severe; bipolar disorder; mdd; SIMD Past Medical History:  Past Medical History:  Diagnosis Date   Alcohol abuse    Alcoholism ([place])    Anemia    Anxiety    Blood transfusion without reported  diagnosis    Cirrhosis ([place])    Depression    Esophageal varices with bleeding(456.0) 06/13/2014   GERD (gastroesophageal reflux disease)    Heart murmur    Patient states she may have   Menorrhagia    Pancytopenia ([place]) 01/15/2014   Pneumonia    Portal hypertension ([place])    S/P alcohol detoxification    2-3 days at behavioral health previously   UGI bleed 06/12/2014    Past Surgical History:  Procedure Laterality Date   CHOLECYSTECTOMY     ESOPHAGOGASTRODUODENOSCOPY N/A 06/12/2014   Procedure: ESOPHAGOGASTRODUODENOSCOPY (EGD);  Surgeon: Gatha Mayer, MD;  Location: [facility];  Service: Endoscopy;  Laterality: N/A;   ESOPHAGOGASTRODUODENOSCOPY (EGD) WITH PROPOFOL N/A 07/29/2014   Procedure: ESOPHAGOGASTRODUODENOSCOPY (EGD) WITH PROPOFOL;  Surgeon: Inda Castle, MD;  Location: [facility];  Service: Endoscopy;  Laterality: N/A;   ESOPHAGOGASTRODUODENOSCOPY (EGD) WITH PROPOFOL N/A 01/20/2018   Procedure: ESOPHAGOGASTRODUODENOSCOPY (EGD) WITH PROPOFOL;  Surgeon: Mauri Pole, MD;  Location: [facility] ENDOSCOPY;  Service: Endoscopy;  Laterality: N/A;   Family History:  Family History  Problem Relation Age of Onset   Colon polyps Mother    Hypertension Mother    Thyroid disease Mother    Alcoholism Mother    Alcoholism Father    Alcohol abuse Maternal Grandfather    Alcohol abuse Paternal Grandfather    Alcohol abuse Paternal Aunt    Alcohol abuse Maternal Uncle    Family Psychiatric  History:  per chart, mother, father, MGF and PGF, aunt and uncle with alcohol abuse Social History:  Social History  Substance and Sexual Activity  Alcohol Use Yes   Alcohol/week: 0.0 standard drinks   Comment: Usually drinks 2-3 bottles of wine daily when drinking.      Social History   Substance and Sexual Activity  Drug Use No   Types: Cocaine, Marijuana   Comment: denies    Social History   Socioeconomic History   Marital status: Married    Spouse name: Sara [name]   Number of  children: 2   Years of education: Associates   Highest education level: Not on file  Occupational History   Occupation: Paralegal  Tobacco Use   Smoking status: Former    Packs/day: 0.25    Types: Cigarettes    Quit date: 11/22/2018    Years since quitting: 3.1   Smokeless tobacco: Never  Substance and Sexual Activity   Alcohol use: Yes    Alcohol/week: 0.0 standard drinks    Comment: Usually drinks 2-3 bottles of wine daily when drinking.    Drug use: No    Types: Cocaine, Marijuana    Comment: denies   Sexual activity: Never    Birth control/protection: None  Other Topics Concern   Not on file  Social History Narrative   Lives with husband and son. Daughter lives nearby. 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   Social Determinants of Health   Financial Resource Strain: Not on file  Food Insecurity: Not on file  Transportation Needs: Not on file  Physical Activity: Not on file  Stress: Not on file  Social Connections: Not on file   SDOH:  SDOH Screenings   Alcohol Screen: Not on file  Depression (PHQ2-9): Medium Risk   PHQ-2 Score: 16  Financial Resource Strain: Not on file  Food Insecurity: Not on file  Housing: Not on file  Physical Activity: Not on file  Social Connections: Not on file  Stress: Not on file  Tobacco Use: Medium Risk   Smoking Tobacco Use: Former   Smokeless Tobacco Use: Never   Passive Exposure: Not on file  Transportation Needs: Not on file   Additional Social History:    Pain Medications: See MAR Prescriptions: See MAR Over the Counter: See MAR History of alcohol / drug use?: Yes Longest period of sobriety (when/how long): 9 months in 2017 Negative Consequences of Use: Personal relationships, Work / Youth worker Withdrawal Symptoms: DTs, Sweats, Tremors, Blackouts, Agitation, Irritability Name of Substance 1: Alcohol 1 - Age of First Use: 25 1 - Amount (size/oz): Approximately 1 fifth of vodka 1 - Frequency:  Daily 1 - Duration: Ongoing 1 - Last Use / Amount: 01/24/2022 1 - Method of Aquiring: Store 1- Route of Use: Oral ingestion                  Sleep: Fair  Appetite:   decreased 2/2 nausea  Current Medications:  Current Facility-Administered Medications  Medication Dose Route Frequency Provider Last Rate Last Admin   escitalopram (LEXAPRO) tablet 40 mg  40 mg Oral Daily Evette Georges, NP   40 mg at 01/28/22 0916   gabapentin (NEURONTIN) capsule 200 mg  200 mg Oral TID Evette Georges, NP   200 mg at 01/28/22 0916   hydrOXYzine (ATARAX) tablet 25 mg  25 mg Oral TID PRN Evette Georges, NP   25 mg at 01/28/22 0916   [START ON 01/29/2022] LORazepam (ATIVAN) tablet 1 mg  1 mg Oral Daily Evette Georges, NP       magnesium hydroxide (MILK OF MAGNESIA)  suspension 30 mL  30 mL Oral Daily PRN Ival Bible, MD       multivitamin with minerals tablet 1 tablet  1 tablet Oral Daily Evette Georges, NP   1 tablet at 01/28/22 0916   polyethylene glycol (MIRALAX / GLYCOLAX) packet 17 g  17 g Oral Daily PRN Ival Bible, MD   17 g at 01/27/22 1009   QUEtiapine (SEROQUEL) tablet 100 mg  100 mg Oral QHS Ival Bible, MD   100 mg at 01/27/22 2143   thiamine tablet 100 mg  100 mg Oral Daily Evette Georges, NP   100 mg at 01/28/22 0916   topiramate (TOPAMAX) tablet 100 mg  100 mg Oral BID Evette Georges, NP   100 mg at 01/28/22 1610   Current Outpatient Medications  Medication Sig Dispense Refill   escitalopram (LEXAPRO) 20 MG tablet Take 2 tablets (40 mg total) by mouth daily. 60 tablet 1   gabapentin (NEURONTIN) 100 MG capsule Take 2 capsules (200 mg total) by mouth 3 (three) times daily. 180 capsule 1   Multiple Vitamin (MULTIVITAMIN WITH MINERALS) TABS tablet Take 1 tablet by mouth daily.     QUEtiapine (SEROQUEL) 100 MG tablet Take 1 tablet (100 mg total) by mouth at bedtime. 30 tablet 1   topiramate (TOPAMAX) 100 MG tablet Take 1 tablet (100 mg total) by mouth 2 (two) times daily.  60 tablet 1    Labs  Lab Results:  Admission on 01/24/2022  Component Date Value Ref Range Status   SARS Coronavirus 2 by RT PCR 01/24/2022 NEGATIVE  NEGATIVE Final   Comment: (NOTE) SARS-CoV-2 target nucleic acids are NOT DETECTED.  The SARS-CoV-2 RNA is generally detectable in upper respiratory specimens during the acute phase of infection. The lowest concentration of SARS-CoV-2 viral copies this assay can detect is 138 copies/mL. A negative result does not preclude SARS-Cov-2 infection and should not be used as the sole basis for treatment or other patient management decisions. A negative result may occur with  improper specimen collection/handling, submission of specimen other than nasopharyngeal swab, presence of viral mutation(s) within the areas targeted by this assay, and inadequate number of viral copies(<138 copies/mL). A negative result must be combined with clinical observations, patient history, and epidemiological information. The expected result is Negative.  Fact Sheet for Patients:  EntrepreneurPulse.com.au  Fact Sheet for Healthcare Providers:  IncredibleEmployment.be  This test is no                          t yet approved or cleared by the [place] FDA and  has been authorized for detection and/or diagnosis of SARS-CoV-2 by FDA under an Emergency Use Authorization (EUA). This EUA will remain  in effect (meaning this test can be used) for the duration of the COVID-19 declaration under Section 564(b)(1) of the Act, 21 U.S.C.section 360bbb-3(b)(1), unless the authorization is terminated  or revoked sooner.       Influenza A by PCR 01/24/2022 NEGATIVE  NEGATIVE Final   Influenza B by PCR 01/24/2022 NEGATIVE  NEGATIVE Final   Comment: (NOTE) The Xpert Xpress SARS-CoV-2/FLU/RSV plus assay is intended as an aid in the diagnosis of influenza from Nasopharyngeal swab specimens and should not be used as a sole basis for  treatment. Nasal washings and aspirates are unacceptable for Xpert Xpress SARS-CoV-2/FLU/RSV testing.  Fact Sheet for Patients: EntrepreneurPulse.com.au  Fact Sheet for Healthcare Providers: IncredibleEmployment.be  This test is not yet approved  or cleared by the [place] and has been authorized for detection and/or diagnosis of SARS-CoV-2 by FDA under an Emergency Use Authorization (EUA). This EUA will remain in effect (meaning this test can be used) for the duration of the COVID-19 declaration under Section 564(b)(1) of the Act, 21 U.S.C. section 360bbb-3(b)(1), unless the authorization is terminated or revoked.  Performed at [facility], [place] [address] [place], [place] [address]    SARS Coronavirus 2 Ag 01/24/2022 Negative  Negative Preliminary   WBC 01/24/2022 4.0  4.0 - 10.5 K/uL Final   RBC 01/24/2022 3.79 (L)  3.87 - 5.11 MIL/uL Final   Hemoglobin 01/24/2022 11.4 (L)  12.0 - 15.0 g/dL Final   HCT 01/24/2022 34.4 (L)  36.0 - 46.0 % Final   MCV 01/24/2022 90.8  80.0 - 100.0 fL Final   MCH 01/24/2022 30.1  26.0 - 34.0 pg Final   MCHC 01/24/2022 33.1  30.0 - 36.0 g/dL Final   RDW 01/24/2022 16.0 (H)  11.5 - 15.5 % Final   Platelets 01/24/2022 99 (L)  150 - 400 K/uL Final   Comment: Immature Platelet Fraction may be clinically indicated, consider ordering this additional test [id number] CONSISTENT WITH PREVIOUS RESULT REPEATED TO VERIFY    nRBC 01/24/2022 0.0  0.0 - 0.2 % Final   Neutrophils Relative % 01/24/2022 49  % Final   Neutro Abs 01/24/2022 2.0  1.7 - 7.7 K/uL Final   Lymphocytes Relative 01/24/2022 40  % Final   Lymphs Abs 01/24/2022 1.6  0.7 - 4.0 K/uL Final   Monocytes Relative 01/24/2022 9  % Final   Monocytes Absolute 01/24/2022 0.4  0.1 - 1.0 K/uL Final   Eosinophils Relative 01/24/2022 1  % Final   Eosinophils Absolute 01/24/2022 0.1  0.0 - 0.5 K/uL Final   Basophils Relative 01/24/2022 1  % Final    Basophils Absolute 01/24/2022 0.0  0.0 - 0.1 K/uL Final   Immature Granulocytes 01/24/2022 0  % Final   Abs Immature Granulocytes 01/24/2022 0.01  0.00 - 0.07 K/uL Final   Performed at [facility], [place] [address] [place], [place] [address]   Sodium 01/24/2022 136  135 - 145 mmol/L Final   Potassium 01/24/2022 2.8 (L)  3.5 - 5.1 mmol/L Final   Chloride 01/24/2022 109  98 - 111 mmol/L Final   CO2 01/24/2022 17 (L)  22 - 32 mmol/L Final   Glucose, Bld 01/24/2022 90  70 - 99 mg/dL Final   Glucose reference range applies only to samples taken after fasting for at least 8 hours.   BUN 01/24/2022 6  6 - 20 mg/dL Final   Creatinine, Ser 01/24/2022 0.68  0.44 - 1.00 mg/dL Final   Calcium 01/24/2022 8.2 (L)  8.9 - 10.3 mg/dL Final   Total Protein 01/24/2022 7.0  6.5 - 8.1 g/dL Final   Albumin 01/24/2022 3.3 (L)  3.5 - 5.0 g/dL Final   AST 01/24/2022 36  15 - 41 U/L Final   ALT 01/24/2022 22  0 - 44 U/L Final   Alkaline Phosphatase 01/24/2022 77  38 - 126 U/L Final   Total Bilirubin 01/24/2022 0.9  0.3 - 1.2 mg/dL Final   GFR, Estimated 01/24/2022 >60  >60 mL/min Final   Comment: (NOTE) Calculated using the CKD-EPI Creatinine Equation (2021)    Anion gap 01/24/2022 10  5 - 15 Final   Performed at [name], [place] [address] [place], [place] [address]  Hgb A1c MFr Bld 01/24/2022 4.7 (L)  4.8 - 5.6 % Final   Comment: (NOTE) Pre diabetes:          5.7%-6.4%  Diabetes:              >6.4%  Glycemic control for   <7.0% adults with diabetes    Mean Plasma Glucose 01/24/2022 88.19  mg/dL Final   Performed at [facility], [place] [address] [place], [place] [address]   Alcohol, Ethyl (B) 01/24/2022 201 (H)  <10 mg/dL Final   Comment: (NOTE) Lowest detectable limit for serum alcohol is 10 mg/dL.  For medical purposes only. Performed at [facility], [place] [address], [place], [place] [address]    TSH 01/24/2022 3.012  0.350 - 4.500 uIU/mL Final   Comment: Performed by  a 3rd Generation assay with a functional sensitivity of <=0.01 uIU/mL. Performed at [facility], [place] [address] [place], [place] [address]    Color, Urine 01/24/2022 YELLOW  YELLOW Final   APPearance 01/24/2022 CLEAR  CLEAR Final   Specific Gravity, Urine 01/24/2022 1.006  1.005 - 1.030 Final   pH 01/24/2022 7.0  5.0 - 8.0 Final   Glucose, UA 01/24/2022 NEGATIVE  NEGATIVE mg/dL Final   Hgb urine dipstick 01/24/2022 NEGATIVE  NEGATIVE Final   Bilirubin Urine 01/24/2022 NEGATIVE  NEGATIVE Final   Ketones, ur 01/24/2022 NEGATIVE  NEGATIVE mg/dL Final   Protein, ur 01/24/2022 NEGATIVE  NEGATIVE mg/dL Final   Nitrite 01/24/2022 NEGATIVE  NEGATIVE Final   Leukocytes,Ua 01/24/2022 NEGATIVE  NEGATIVE Final   Performed at [facility], [place] [address] [place],  [address]   Preg Test, Ur 01/24/2022 NEGATIVE  NEGATIVE Final   Comment:        THE SENSITIVITY OF THIS METHODOLOGY IS >20 mIU/mL. Performed at [facility], [place] [address], [place], [place] [address]    POC Amphetamine UR 01/24/2022 None Detected  NONE DETECTED (Cut Off Level 1000 ng/mL) Final   POC Secobarbital (BAR) 01/24/2022 None Detected  NONE DETECTED (Cut Off Level 300 ng/mL) Final   POC Buprenorphine (BUP) 01/24/2022 None Detected  NONE DETECTED (Cut Off Level 10 ng/mL) Final   POC Oxazepam (BZO) 01/24/2022 Positive (A)  NONE DETECTED (Cut Off Level 300 ng/mL) Final   POC Cocaine UR 01/24/2022 None Detected  NONE DETECTED (Cut Off Level 300 ng/mL) Final   POC Methamphetamine UR 01/24/2022 None Detected  NONE DETECTED (Cut Off Level 1000 ng/mL) Final   POC Morphine 01/24/2022 None Detected  NONE DETECTED (Cut Off Level 300 ng/mL) Final   POC Oxycodone UR 01/24/2022 None Detected  NONE DETECTED (Cut Off Level 100 ng/mL) Final   POC Methadone UR 01/24/2022 None Detected  NONE DETECTED (Cut Off Level 300 ng/mL) Final   POC Marijuana UR 01/24/2022 None Detected  NONE DETECTED (Cut Off Level 50 ng/mL) Final    Preg Test, Ur 01/24/2022 NEGATIVE  NEGATIVE Final   Comment:        THE SENSITIVITY OF THIS METHODOLOGY IS >24 mIU/mL    SARSCOV2ONAVIRUS 2 AG 01/24/2022 NEGATIVE  NEGATIVE Final   Comment: (NOTE) SARS-CoV-2 antigen NOT DETECTED.   Negative results are presumptive.  Negative results do not preclude SARS-CoV-2 infection and should not be used as the sole basis for treatment or other patient management decisions, including infection  control decisions, particularly in the presence of clinical signs and  symptoms consistent with COVID-19, or in those who have been in  contact with the virus.  Negative results must be combined with clinical observations, patient history, and epidemiological information. The expected result is Negative.  Fact Sheet for Patients: HandmadeRecipes.com.cy  Fact Sheet for Healthcare Providers: FuneralLife.at  This test is not yet approved or cleared by the [place] FDA and  has been authorized for detection and/or diagnosis of SARS-CoV-2 by FDA under an Emergency Use Authorization (EUA).  This EUA will remain in effect (meaning this test can be used) for the duration of  the COV                          ID-19 declaration under Section 564(b)(1) of the Act, 21 U.S.C. section 360bbb-3(b)(1), unless the authorization is terminated or revoked sooner.     Cholesterol 01/24/2022 137  0 - 200 mg/dL Final   Triglycerides 01/24/2022 189 (H)  <150 mg/dL Final   HDL 01/24/2022 32 (L)  >40 mg/dL Final   Total CHOL/HDL Ratio 01/24/2022 4.3  RATIO Final   VLDL 01/24/2022 38  0 - 40 mg/dL Final   LDL Cholesterol 01/24/2022 67  0 - 99 mg/dL Final   Comment:        Total Cholesterol/HDL:CHD Risk Coronary Heart Disease Risk Table                     Men   Women  1/2 Average Risk   3.4   3.3  Average Risk       5.0   4.4  2 X Average Risk   9.6   7.1  3 X Average Risk  23.4   11.0        Use the calculated  Patient Ratio above and the CHD Risk Table to determine the patient's CHD Risk.        ATP III CLASSIFICATION (LDL):  <100     mg/dL   Optimal  100-129  mg/dL   Near or Above                    Optimal  130-159  mg/dL   Borderline  160-189  mg/dL   High  >190     mg/dL   Very High Performed at [place] [address] [place], [place] [address]    Sodium 01/25/2022 139  135 - 145 mmol/L Final   Potassium 01/25/2022 4.0  3.5 - 5.1 mmol/L Final   NO VISIBLE HEMOLYSIS   Chloride 01/25/2022 114 (H)  98 - 111 mmol/L Final   CO2 01/25/2022 19 (L)  22 - 32 mmol/L Final   Glucose, Bld 01/25/2022 83  70 - 99 mg/dL Final   Glucose reference range applies only to samples taken after fasting for at least 8 hours.   BUN 01/25/2022 7  6 - 20 mg/dL Final   Creatinine, Ser 01/25/2022 0.53  0.44 - 1.00 mg/dL Final   Calcium 01/25/2022 8.4 (L)  8.9 - 10.3 mg/dL Final   Total Protein 01/25/2022 6.7  6.5 - 8.1 g/dL Final   Albumin 01/25/2022 3.1 (L)  3.5 - 5.0 g/dL Final   AST 01/25/2022 33  15 - 41 U/L Final   ALT 01/25/2022 19  0 - 44 U/L Final   Alkaline Phosphatase 01/25/2022 70  38 - 126 U/L Final   Total Bilirubin 01/25/2022 0.8  0.3 - 1.2 mg/dL Final   GFR, Estimated 01/25/2022 >60  >60 mL/min Final   Comment: (NOTE) Calculated  using the CKD-EPI Creatinine Equation (2021)    Anion gap 01/25/2022 6  5 - 15 Final   Performed at [facility], [place] [address] [place], [place] [address]  Admission on 10/07/2021, Discharged on 10/11/2021  Component Date Value Ref Range Status   POC Amphetamine UR 10/07/2021 None Detected  NONE DETECTED (Cut Off Level 1000 ng/mL) Final   POC Secobarbital (BAR) 10/07/2021 None Detected  NONE DETECTED (Cut Off Level 300 ng/mL) Final   POC Buprenorphine (BUP) 10/07/2021 None Detected  NONE DETECTED (Cut Off Level 10 ng/mL) Final   POC Oxazepam (BZO) 10/07/2021 Positive (A)  NONE DETECTED (Cut Off Level 300 ng/mL) Final   POC Cocaine UR 10/07/2021  None Detected  NONE DETECTED (Cut Off Level 300 ng/mL) Final   POC Methamphetamine UR 10/07/2021 None Detected  NONE DETECTED (Cut Off Level 1000 ng/mL) Final   POC Morphine 10/07/2021 None Detected  NONE DETECTED (Cut Off Level 300 ng/mL) Final   POC Oxycodone UR 10/07/2021 None Detected  NONE DETECTED (Cut Off Level 100 ng/mL) Final   POC Methadone UR 10/07/2021 None Detected  NONE DETECTED (Cut Off Level 300 ng/mL) Final   POC Marijuana UR 10/07/2021 None Detected  NONE DETECTED (Cut Off Level 50 ng/mL) Final  Admission on 10/06/2021, Discharged on 10/07/2021  Component Date Value Ref Range Status   Lipase 10/06/2021 93 (H)  11 - 51 U/L Final   Performed at [facility], [place] [address] [place], [place] [address]   Sodium 10/06/2021 140  135 - 145 mmol/L Final   Potassium 10/06/2021 3.3 (L)  3.5 - 5.1 mmol/L Final   Chloride 10/06/2021 110  98 - 111 mmol/L Final   CO2 10/06/2021 20 (L)  22 - 32 mmol/L Final   Glucose, Bld 10/06/2021 119 (H)  70 - 99 mg/dL Final   Glucose reference range applies only to samples taken after fasting for at least 8 hours.   BUN 10/06/2021 9  6 - 20 mg/dL Final   Creatinine, Ser 10/06/2021 0.52  0.44 - 1.00 mg/dL Final   Calcium 10/06/2021 8.4 (L)  8.9 - 10.3 mg/dL Final   Total Protein 10/06/2021 7.2  6.5 - 8.1 g/dL Final   Albumin 10/06/2021 3.1 (L)  3.5 - 5.0 g/dL Final   AST 10/06/2021 49 (H)  15 - 41 U/L Final   ALT 10/06/2021 24  0 - 44 U/L Final   Alkaline Phosphatase 10/06/2021 88  38 - 126 U/L Final   Total Bilirubin 10/06/2021 0.8  0.3 - 1.2 mg/dL Final   GFR, Estimated 10/06/2021 >60  >60 mL/min Final   Comment: (NOTE) Calculated using the CKD-EPI Creatinine Equation (2021)    Anion gap 10/06/2021 10  5 - 15 Final   Performed at [facility], [place] [address], [place], [place] [address]   WBC 10/06/2021 2.2 (L)  4.0 - 10.5 K/uL Final   RBC 10/06/2021 3.93  3.87 - 5.11 MIL/uL Final   Hemoglobin 10/06/2021  11.4 (L)  12.0 - 15.0 g/dL Final   HCT 10/06/2021 35.1 (L)  36.0 - 46.0 % Final   MCV 10/06/2021 89.3  80.0 - 100.0 fL Final   MCH 10/06/2021 29.0  26.0 - 34.0 pg Final   MCHC 10/06/2021 32.5  30.0 - 36.0 g/dL Final   RDW 10/06/2021 17.4 (H)  11.5 - 15.5 % Final   Platelets 10/06/2021 76 (L)  150 - 400 K/uL Final   Comment: SPECIMEN CHECKED FOR CLOTS  Immature Platelet Fraction may be clinically indicated, consider ordering this additional test [id number] REPEATED TO VERIFY PLATELET COUNT CONFIRMED BY SMEAR    nRBC 10/06/2021 0.0  0.0 - 0.2 % Final   Performed at [facility], [place] [address] [place], [place] [address]   Color, Urine 10/07/2021 YELLOW  YELLOW Final   APPearance 10/07/2021 CLOUDY (A)  CLEAR Final   Specific Gravity, Urine 10/07/2021 1.011  1.005 - 1.030 Final   pH 10/07/2021 5.0  5.0 - 8.0 Final   Glucose, UA 10/07/2021 NEGATIVE  NEGATIVE mg/dL Final   Hgb urine dipstick 10/07/2021 SMALL (A)  NEGATIVE Final   Bilirubin Urine 10/07/2021 NEGATIVE  NEGATIVE Final   Ketones, ur 10/07/2021 5 (A)  NEGATIVE mg/dL Final   Protein, ur 10/07/2021 NEGATIVE  NEGATIVE mg/dL Final   Nitrite 10/07/2021 NEGATIVE  NEGATIVE Final   Leukocytes,Ua 10/07/2021 LARGE (A)  NEGATIVE Final   RBC / HPF 10/07/2021 6-10  0 - 5 RBC/hpf Final   WBC, UA 10/07/2021 11-20  0 - 5 WBC/hpf Final   Bacteria, UA 10/07/2021 MANY (A)  NONE SEEN Final   Squamous Epithelial / LPF 10/07/2021 11-20  0 - 5 Final   Mucus 10/07/2021 PRESENT   Final   Performed at [facility], [place] [address] [place], [place] [address]   I-stat hCG, quantitative 10/06/2021 <5.0  <5 mIU/mL Final   Comment 3 10/06/2021          Final   Comment:   GEST. AGE      CONC.  (mIU/mL)   <=1 WEEK        5 - 50     2 WEEKS       50 - 500     3 WEEKS       100 - 10,000     4 WEEKS     1,000 - 30,000        FEMALE AND NON-PREGNANT FEMALE:     LESS THAN 5 mIU/mL    Alcohol, Ethyl (B) 10/06/2021 285 (H)   <10 mg/dL Final   Comment: (NOTE) Lowest detectable limit for serum alcohol is 10 mg/dL.  For medical purposes only. Performed at [facility], [place] [address], [place], [place] [address]    SARS Coronavirus 2 by RT PCR 10/07/2021 NEGATIVE  NEGATIVE Final   Comment: (NOTE) SARS-CoV-2 target nucleic acids are NOT DETECTED.  The SARS-CoV-2 RNA is generally detectable in upper respiratory specimens during the acute phase of infection. The lowest concentration of SARS-CoV-2 viral copies this assay can detect is 138 copies/mL. A negative result does not preclude SARS-Cov-2 infection and should not be used as the sole basis for treatment or other patient management decisions. A negative result may occur with  improper specimen collection/handling, submission of specimen other than nasopharyngeal swab, presence of viral mutation(s) within the areas targeted by this assay, and inadequate number of viral copies(<138 copies/mL). A negative result must be combined with clinical observations, patient history, and epidemiological information. The expected result is Negative.  Fact Sheet for Patients:  EntrepreneurPulse.com.au  Fact Sheet for Healthcare Providers:  IncredibleEmployment.be  This test is no                          t yet approved or cleared by the [place] FDA and  has been authorized for detection and/or diagnosis of SARS-CoV-2 by FDA under an Emergency Use Authorization (EUA). This EUA  will remain  in effect (meaning this test can be used) for the duration of the COVID-19 declaration under Section 564(b)(1) of the Act, 21 U.S.C.section 360bbb-3(b)(1), unless the authorization is terminated  or revoked sooner.       Influenza A by PCR 10/07/2021 NEGATIVE  NEGATIVE Final   Influenza B by PCR 10/07/2021 NEGATIVE  NEGATIVE Final   Comment: (NOTE) The Xpert Xpress SARS-CoV-2/FLU/RSV plus assay is intended as an  aid in the diagnosis of influenza from Nasopharyngeal swab specimens and should not be used as a sole basis for treatment. Nasal washings and aspirates are unacceptable for Xpert Xpress SARS-CoV-2/FLU/RSV testing.  Fact Sheet for Patients: EntrepreneurPulse.com.au  Fact Sheet for Healthcare Providers: IncredibleEmployment.be  This test is not yet approved or cleared by the [place] FDA and has been authorized for detection and/or diagnosis of SARS-CoV-2 by FDA under an Emergency Use Authorization (EUA). This EUA will remain in effect (meaning this test can be used) for the duration of the COVID-19 declaration under Section 564(b)(1) of the Act, 21 U.S.C. section 360bbb-3(b)(1), unless the authorization is terminated or revoked.  Performed at [facility], [place] [address], [place], [place] [address]     Blood Alcohol level:  Lab Results  Component Value Date   ETH 201 (H) 01/24/2022   ETH 285 (H) 29/92/4268    Metabolic Disorder Labs: Lab Results  Component Value Date   HGBA1C 4.7 (L) 01/24/2022   MPG 88.19 01/24/2022   MPG 102.54 01/20/2021   Lab Results  Component Value Date   PROLACTIN 28.7 (H) 05/04/2017   Lab Results  Component Value Date   CHOL 137 01/24/2022   TRIG 189 (H) 01/24/2022   HDL 32 (L) 01/24/2022   CHOLHDL 4.3 01/24/2022   VLDL 38 01/24/2022   LDLCALC 67 01/24/2022   LDLCALC 83 01/20/2021    Therapeutic Lab Levels: No results found for: LITHIUM No results found for: VALPROATE No components found for:  CBMZ  Physical Findings   AIMS    Flowsheet Row Admission (Discharged) from 04/19/2019 in [place] [facility] Admission (Discharged) from 05/02/2017 in [facility]  AIMS Total Score 0 0      AUDIT    Flowsheet Row Admission (Discharged) from 04/19/2019 in [facility] [facility] Admission  (Discharged) from 05/02/2017 in [place] [facility] Admission (Discharged) from 12/22/2014 in [facility] [facility] ED from 11/05/2014 in [place] [facility] Admission (Discharged) from 02/02/2014 in [place] [facility]  Alcohol Use Disorder Identification Test Final Score (AUDIT) 34 34 29 35 38      GAD-7    Flowsheet Row Video Visit from 11/09/2021 in [facility] Video Visit from 07/08/2021 in [facility] Video Visit from 05/13/2021 in [facility] Video Visit from 04/02/2021 in [facility] Video Visit from 02/17/2021 in [facility]  Total GAD-7 Score '16 18 16 15 11      '$ PHQ2-9    Flowsheet Row Video Visit from 11/09/2021 in [facility] ED from 10/06/2021 in [place] [facility]PT Video Visit from 07/08/2021 in [facility] Video Visit from 05/13/2021 in [facility] Video Visit from 04/02/2021 in [facility]  PHQ-2 Total Score '2 4 2 2 3  '$ PHQ-9 Total Score  $'16 15 9 10 15      'C$ Flowsheet Row ED from 01/24/2022 in [facility] ED from 01/01/2022 in [facility] Video Visit from 11/09/2021 in [place] No Risk No Risk No Risk        Musculoskeletal  Strength & Muscle Tone: within normal limits Gait & Station: normal Patient leans: N/A  Psychiatric Specialty Exam  Presentation  General Appearance: Appropriate for Environment; Casual  Eye Contact:Good  Speech:Clear and Coherent; Normal Rate  Speech Volume:Normal  Handedness:Right   Mood and Affect  Mood:-- ("good")  Affect:Appropriate;  Congruent   Thought Process  Thought Processes:Coherent; Goal Directed; Linear  Descriptions of Associations:Intact  Orientation:Full (Time, Place and Person)  Thought Content:WDL; Logical  Diagnosis of Schizophrenia or Schizoaffective disorder in past: No    Hallucinations:Hallucinations: None  Ideas of Reference:None  Suicidal Thoughts:Suicidal Thoughts: No  Homicidal Thoughts:Homicidal Thoughts: No   Sensorium  Memory:Immediate Good; Recent Good; Remote Good  Judgment:Fair  Insight:Fair   Executive Functions  Concentration:Good  Attention Span:Good  [place] of Knowledge:Good  Language:Good   Psychomotor Activity  Psychomotor Activity:Psychomotor Activity: Normal   Assets  Assets:Communication Skills; Desire for Improvement; Resilience   Sleep  Sleep:Sleep: Fair   No data recorded   Physical Exam  Physical Exam Constitutional:      Appearance: Normal appearance.  HENT:     Head: Normocephalic and atraumatic.  Pulmonary:     Effort: Pulmonary effort is normal.  Neurological:     Mental Status: She is alert and oriented to person, place, and time.   Review of Systems  Constitutional:  Negative for chills and fever.  HENT:  Negative for hearing loss.   Eyes:  Negative for discharge and redness.  Respiratory:  Negative for cough.   Cardiovascular:  Negative for chest pain.  Gastrointestinal:  Positive for constipation. Negative for abdominal pain, nausea and vomiting.  Musculoskeletal:  Negative for myalgias.  Neurological:  Negative for headaches.  Psychiatric/Behavioral:  Positive for depression and substance abuse. Negative for hallucinations and suicidal ideas.   Blood pressure 107/69, pulse 78, temperature 97.8 F (36.6 C), temperature source Oral, resp. rate 18, SpO2 96 %. There is no height or weight on file to calculate BMI.  Treatment Plan Summary: Sara [name] is a 46 yo female w PMH significant for bipolar disorder,  anxiety, PTSD, misuse of benzodiazepines,and alcohol use disorder who was admitted to the [facility] on 01/24/2022 requesting alcohol treatment. Etoh 201; UDS +bzd.   Patient tolerating Ativan taper well.  Most recent CIWA 0.  Patient denies SI/HI/AVH.  She reports improvement in mood. Lawyer was able to postpone court date (letter placed in physical chart). Anticipate discharge to [facility] residential on Monday morning. Ativan taper ends on Saturday morning. Patient is high risk for relapse and is appropriate to remain in the [facility] through Monday morning to ensure that she makes it to residential treatment.  Substance induced mood disorder Alcohol Use Disorder, severe Hx of Complicated Withdrawals -continue CIWA protocol -continue ativan detox- 1 mg QID --> 1 mg TID--> 1 mg BID--> 1 mg daily -multivitamin -thiamine -continue Gabapentin '200mg'$  TID   Bipolar Disorder -Continue home Seroquel '100mg'$  -Continue home Topamax 100 BID -Continue home Lexapro 40 mg daily -continue seroquel 100 mg qhs  Hypokalemia, resolved -2.9 K on presentation (01/24/2022) -received 40 meq x 2 doses with repeat K 4.0 (01/25/2022)  Constipation -PRN MoM -PRN glycolax  Dispo: Ongoing.  Social work Sports administrator.  Anticipate discharge to [facility] on Monday morning. Will need 14 day samples and 30 day scripts with one refill prior to dc.   Ival Bible, MD 01/28/2022 12:44 PM
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
## 2022-01-28 NOTE — ED Notes (Signed)
Pt is in dining room attending evening groups ?

## 2022-01-28 NOTE — Progress Notes (Signed)
CSW spoke with Sara [name], pt's lawyer, [phone]. ? ?CSW was able to confirm that pt's court date can be moved.  Lawyer is faxing a letter that states that and CSW will provide this info to Dr. Serafina Mitchell. ? ?She reports that she has NOT received a letter from Dr. Serafina Mitchell and asked that it be resent to [phone].  CSW has made physician aware. ? ?Assunta Curtis, MSW, LCSW ?01/28/2022 10:44 AM  ?

## 2022-01-28 NOTE — ED Notes (Signed)
Pt reports she slept ok over night.  States her mood is "a little anxious" today but would not provide further details.  Denies SI, HI, AVH, and pain.  Pt currently in dayroom interacting with others positively.  Breakfast provided.  Breathing is even and unlabored.  Will continue to monitor for safety.  ?

## 2022-01-28 NOTE — ED Notes (Signed)
Pt in room asleep.  Breathing is unlabored and even. Will continue to monitor for safety.  ?

## 2022-01-28 NOTE — ED Provider Notes (Signed)
Letter faxed to patient's lawyer; received letter back from lawyer that states patient's court date will be moved so that she may attend rehab. Letter placed in physical chart. ?

## 2022-01-28 NOTE — ED Notes (Signed)
Snacks given 

## 2022-01-29 DIAGNOSIS — F319 Bipolar disorder, unspecified: Secondary | ICD-10-CM | POA: Diagnosis not present

## 2022-01-29 DIAGNOSIS — F419 Anxiety disorder, unspecified: Secondary | ICD-10-CM | POA: Diagnosis not present

## 2022-01-29 DIAGNOSIS — F1020 Alcohol dependence, uncomplicated: Secondary | ICD-10-CM | POA: Diagnosis not present

## 2022-01-29 DIAGNOSIS — Z20822 Contact with and (suspected) exposure to covid-19: Secondary | ICD-10-CM | POA: Diagnosis not present

## 2022-01-29 NOTE — Progress Notes (Signed)
Pt is in the dayroom watching TV. No distress noted or concerns voiced. Staff will monitor for pt's safety. ?

## 2022-01-29 NOTE — ED Notes (Signed)
Refused group was given a handout, became out for a snack ?

## 2022-01-29 NOTE — Progress Notes (Signed)
Pt is presently resting quietly in her room. No signs of acute distress or discomfort noted. Administered scheduled meds with no incident. Pt denies pain and current SI/HI/AVH. Staff will monitor for pt's safety. ?

## 2022-01-29 NOTE — ED Provider Notes (Signed)
Behavioral Health Progress Note  Date and Time: 01/29/2022 11:36 AM Name: Sara [name] MRN:  [id number]  Subjective:   Reported  " I am doing okay."   Evaluation:  Sara [name] was seen and evaluated face-to-face.  She is awake, alert and oriented x3.  Denying suicidal or homicidal ideations.  Denies auditory visual hallucination.  Denied depression or depressive symptoms.  Does report mild anxiety and is requesting for hydroxyzine to be added.  States she has not had a complete bowel movement and has used milk of magnesia without relief.  Patient encouraged to increase hydration and physical activity.  She was receptive to plan.    We will make as needed Colace available.  Anticipated discharge 01/31/2022 [facility] residential treatment facility.  She reports a fair appetite.  States she is wished well throughout the night.  Staff to continue to monitor for safety.  Support, encouragement and reassurance was provided.  Per admission assessment note:"Danny Alves is a 46 yo female w PMH significant for bipolar disorder, anxiety, PTSD, misuse of benzodiazepines,and alcohol use disorder who was admitted to the [facility] on 01/24/2022 requesting alcohol treatment. Etoh 201; UDS +bzd. "  Diagnosis:  Final diagnoses:  Bipolar 1 disorder ([place])  Alcohol use disorder, severe, dependence ([place])    Total Time spent with patient: 15 minutes  Past Psychiatric History:  Past Medical History:  Past Medical History:  Diagnosis Date   Alcohol abuse    Alcoholism ([place])    Anemia    Anxiety    Blood transfusion without reported diagnosis    Cirrhosis ([place])    Depression    Esophageal varices with bleeding(456.0) 06/13/2014   GERD (gastroesophageal reflux disease)    Heart murmur    Patient states she may have   Menorrhagia    Pancytopenia ([place]) 01/15/2014   Pneumonia    Portal hypertension ([place])    S/P alcohol detoxification    2-3 days at behavioral health previously   UGI bleed 06/12/2014     Past Surgical History:  Procedure Laterality Date   CHOLECYSTECTOMY     ESOPHAGOGASTRODUODENOSCOPY N/A 06/12/2014   Procedure: ESOPHAGOGASTRODUODENOSCOPY (EGD);  Surgeon: Gatha Mayer, MD;  Location: [facility];  Service: Endoscopy;  Laterality: N/A;   ESOPHAGOGASTRODUODENOSCOPY (EGD) WITH PROPOFOL N/A 07/29/2014   Procedure: ESOPHAGOGASTRODUODENOSCOPY (EGD) WITH PROPOFOL;  Surgeon: Inda Castle, MD;  Location: [facility];  Service: Endoscopy;  Laterality: N/A;   ESOPHAGOGASTRODUODENOSCOPY (EGD) WITH PROPOFOL N/A 01/20/2018   Procedure: ESOPHAGOGASTRODUODENOSCOPY (EGD) WITH PROPOFOL;  Surgeon: Mauri Pole, MD;  Location: [facility];  Service: Endoscopy;  Laterality: N/A;   Family History:  Family History  Problem Relation Age of Onset   Colon polyps Mother    Hypertension Mother    Thyroid disease Mother    Alcoholism Mother    Alcoholism Father    Alcohol abuse Maternal Grandfather    Alcohol abuse Paternal Grandfather    Alcohol abuse Paternal Aunt    Alcohol abuse Maternal Uncle    Family Psychiatric  History:  Social History:  Social History   Substance and Sexual Activity  Alcohol Use Yes   Alcohol/week: 0.0 standard drinks   Comment: Usually drinks 2-3 bottles of wine daily when drinking.      Social History   Substance and Sexual Activity  Drug Use No   Types: Cocaine, Marijuana   Comment: denies    Social History   Socioeconomic History   Marital status: Married    Spouse name: [name]  Number of children: 2   Years of education: Associates   Highest education level: Not on file  Occupational History   Occupation: Paralegal  Tobacco Use   Smoking status: Former    Packs/day: 0.25    Types: Cigarettes    Quit date: 11/22/2018    Years since quitting: 3.1   Smokeless tobacco: Never  Substance and Sexual Activity   Alcohol use: Yes    Alcohol/week: 0.0 standard drinks    Comment: Usually drinks 2-3 bottles of wine daily when drinking.     Drug use: No    Types: Cocaine, Marijuana    Comment: denies   Sexual activity: Never    Birth control/protection: None  Other Topics Concern   Not on file  Social History Narrative   Lives with husband and son. Daughter lives nearby. 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   Social Determinants of Health   Financial Resource Strain: Not on file  Food Insecurity: Not on file  Transportation Needs: Not on file  Physical Activity: Not on file  Stress: Not on file  Social Connections: Not on file   SDOH:  SDOH Screenings   Alcohol Screen: Not on file  Depression (PHQ2-9): Medium Risk   PHQ-2 Score: 16  Financial Resource Strain: Not on file  Food Insecurity: Not on file  Housing: Not on file  Physical Activity: Not on file  Social Connections: Not on file  Stress: Not on file  Tobacco Use: Medium Risk   Smoking Tobacco Use: Former   Smokeless Tobacco Use: Never   Passive Exposure: Not on file  Transportation Needs: Not on file   Additional Social History:    Pain Medications: See MAR Prescriptions: See MAR Over the Counter: See MAR History of alcohol / drug use?: Yes Longest period of sobriety (when/how long): 9 months in 2017 Negative Consequences of Use: Personal relationships, Work / Youth worker Withdrawal Symptoms: DTs, Sweats, Tremors, Blackouts, Agitation, Irritability Name of Substance 1: Alcohol 1 - Age of First Use: 25 1 - Amount (size/oz): Approximately 1 fifth of vodka 1 - Frequency: Daily 1 - Duration: Ongoing 1 - Last Use / Amount: 01/24/2022 1 - Method of Aquiring: Store 1- Route of Use: Oral ingestion                  Sleep: Good  Appetite:  Good  Current Medications:  Current Facility-Administered Medications  Medication Dose Route Frequency Provider Last Rate Last Admin   escitalopram (LEXAPRO) tablet 40 mg  40 mg Oral Daily Evette Georges, NP   40 mg at 01/29/22 0829   gabapentin (NEURONTIN) capsule 200 mg  200 mg  Oral TID Evette Georges, NP   200 mg at 01/29/22 4259   hydrOXYzine (ATARAX) tablet 25 mg  25 mg Oral TID PRN Evette Georges, NP   25 mg at 01/28/22 0916   magnesium hydroxide (MILK OF MAGNESIA) suspension 30 mL  30 mL Oral Daily PRN Ival Bible, MD       multivitamin with minerals tablet 1 tablet  1 tablet Oral Daily Evette Georges, NP   1 tablet at 01/29/22 0829   ondansetron (ZOFRAN-ODT) disintegrating tablet 4 mg  4 mg Oral Q8H PRN Ival Bible, MD   4 mg at 01/28/22 1634   polyethylene glycol (MIRALAX / GLYCOLAX) packet 17 g  17 g Oral Daily PRN Ival Bible, MD   17 g at 01/27/22 1009   QUEtiapine (SEROQUEL) tablet 100 mg  100 mg Oral QHS Ival Bible, MD   100 mg at 01/28/22 2118   thiamine tablet 100 mg  100 mg Oral Daily Evette Georges, NP   100 mg at 01/29/22 0830   topiramate (TOPAMAX) tablet 100 mg  100 mg Oral BID Evette Georges, NP   100 mg at 01/29/22 0623   Current Outpatient Medications  Medication Sig Dispense Refill   escitalopram (LEXAPRO) 20 MG tablet Take 2 tablets (40 mg total) by mouth daily. 60 tablet 1   gabapentin (NEURONTIN) 100 MG capsule Take 2 capsules (200 mg total) by mouth 3 (three) times daily. 180 capsule 1   Multiple Vitamin (MULTIVITAMIN WITH MINERALS) TABS tablet Take 1 tablet by mouth daily.     QUEtiapine (SEROQUEL) 100 MG tablet Take 1 tablet (100 mg total) by mouth at bedtime. 30 tablet 1   topiramate (TOPAMAX) 100 MG tablet Take 1 tablet (100 mg total) by mouth 2 (two) times daily. 60 tablet 1    Labs  Lab Results:  Admission on 01/24/2022  Component Date Value Ref Range Status   SARS Coronavirus 2 by RT PCR 01/24/2022 NEGATIVE  NEGATIVE Final   Comment: (NOTE) SARS-CoV-2 target nucleic acids are NOT DETECTED.  The SARS-CoV-2 RNA is generally detectable in upper respiratory specimens during the acute phase of infection. The lowest concentration of SARS-CoV-2 viral copies this assay can detect is 138 copies/mL. A  negative result does not preclude SARS-Cov-2 infection and should not be used as the sole basis for treatment or other patient management decisions. A negative result may occur with  improper specimen collection/handling, submission of specimen other than nasopharyngeal swab, presence of viral mutation(s) within the areas targeted by this assay, and inadequate number of viral copies(<138 copies/mL). A negative result must be combined with clinical observations, patient history, and epidemiological information. The expected result is Negative.  Fact Sheet for Patients:  EntrepreneurPulse.com.au  Fact Sheet for Healthcare Providers:  IncredibleEmployment.be  This test is no                          t yet approved or cleared by the [place] FDA and  has been authorized for detection and/or diagnosis of SARS-CoV-2 by FDA under an Emergency Use Authorization (EUA). This EUA will remain  in effect (meaning this test can be used) for the duration of the COVID-19 declaration under Section 564(b)(1) of the Act, 21 U.S.C.section 360bbb-3(b)(1), unless the authorization is terminated  or revoked sooner.       Influenza A by PCR 01/24/2022 NEGATIVE  NEGATIVE Final   Influenza B by PCR 01/24/2022 NEGATIVE  NEGATIVE Final   Comment: (NOTE) The Xpert Xpress SARS-CoV-2/FLU/RSV plus assay is intended as an aid in the diagnosis of influenza from Nasopharyngeal swab specimens and should not be used as a sole basis for treatment. Nasal washings and aspirates are unacceptable for Xpert Xpress SARS-CoV-2/FLU/RSV testing.  Fact Sheet for Patients: EntrepreneurPulse.com.au  Fact Sheet for Healthcare Providers: IncredibleEmployment.be  This test is not yet approved or cleared by the [place] FDA and has been authorized for detection and/or diagnosis of SARS-CoV-2 by FDA under an Emergency Use Authorization (EUA). This  EUA will remain in effect (meaning this test can be used) for the duration of the COVID-19 declaration under Section 564(b)(1) of the Act, 21 U.S.C. section 360bbb-3(b)(1), unless the authorization is terminated or revoked.  Performed at [facility], [place] [address], [place], [place] [address]  SARS Coronavirus 2 Ag 01/24/2022 Negative  Negative Preliminary   WBC 01/24/2022 4.0  4.0 - 10.5 K/uL Final   RBC 01/24/2022 3.79 (L)  3.87 - 5.11 MIL/uL Final   Hemoglobin 01/24/2022 11.4 (L)  12.0 - 15.0 g/dL Final   HCT 01/24/2022 34.4 (L)  36.0 - 46.0 % Final   MCV 01/24/2022 90.8  80.0 - 100.0 fL Final   MCH 01/24/2022 30.1  26.0 - 34.0 pg Final   MCHC 01/24/2022 33.1  30.0 - 36.0 g/dL Final   RDW 01/24/2022 16.0 (H)  11.5 - 15.5 % Final   Platelets 01/24/2022 99 (L)  150 - 400 K/uL Final   Comment: Immature Platelet Fraction may be clinically indicated, consider ordering this additional test [id number] CONSISTENT WITH PREVIOUS RESULT REPEATED TO VERIFY    nRBC 01/24/2022 0.0  0.0 - 0.2 % Final   Neutrophils Relative % 01/24/2022 49  % Final   Neutro Abs 01/24/2022 2.0  1.7 - 7.7 K/uL Final   Lymphocytes Relative 01/24/2022 40  % Final   Lymphs Abs 01/24/2022 1.6  0.7 - 4.0 K/uL Final   Monocytes Relative 01/24/2022 9  % Final   Monocytes Absolute 01/24/2022 0.4  0.1 - 1.0 K/uL Final   Eosinophils Relative 01/24/2022 1  % Final   Eosinophils Absolute 01/24/2022 0.1  0.0 - 0.5 K/uL Final   Basophils Relative 01/24/2022 1  % Final   Basophils Absolute 01/24/2022 0.0  0.0 - 0.1 K/uL Final   Immature Granulocytes 01/24/2022 0  % Final   Abs Immature Granulocytes 01/24/2022 0.01  0.00 - 0.07 K/uL Final   Performed at [facility], [place] [address] [place], [place] [address]   Sodium 01/24/2022 136  135 - 145 mmol/L Final   Potassium 01/24/2022 2.8 (L)  3.5 - 5.1 mmol/L Final   Chloride 01/24/2022 109  98 - 111 mmol/L Final   CO2 01/24/2022 17 (L)  22 - 32 mmol/L Final    Glucose, Bld 01/24/2022 90  70 - 99 mg/dL Final   Glucose reference range applies only to samples taken after fasting for at least 8 hours.   BUN 01/24/2022 6  6 - 20 mg/dL Final   Creatinine, Ser 01/24/2022 0.68  0.44 - 1.00 mg/dL Final   Calcium 01/24/2022 8.2 (L)  8.9 - 10.3 mg/dL Final   Total Protein 01/24/2022 7.0  6.5 - 8.1 g/dL Final   Albumin 01/24/2022 3.3 (L)  3.5 - 5.0 g/dL Final   AST 01/24/2022 36  15 - 41 U/L Final   ALT 01/24/2022 22  0 - 44 U/L Final   Alkaline Phosphatase 01/24/2022 77  38 - 126 U/L Final   Total Bilirubin 01/24/2022 0.9  0.3 - 1.2 mg/dL Final   GFR, Estimated 01/24/2022 >60  >60 mL/min Final   Comment: (NOTE) Calculated using the CKD-EPI Creatinine Equation (2021)    Anion gap 01/24/2022 10  5 - 15 Final   Performed at [facility], [place] [address], [place], [place] [address]   Hgb A1c MFr Bld 01/24/2022 4.7 (L)  4.8 - 5.6 % Final   Comment: (NOTE) Pre diabetes:          5.7%-6.4%  Diabetes:              >6.4%  Glycemic control for   <7.0% adults with diabetes    Mean Plasma Glucose 01/24/2022 88.19  mg/dL Final   Performed at [facility], [place] [address],  [place], [place] [address]   Alcohol, Ethyl (B) 01/24/2022 201 (H)  <10 mg/dL Final   Comment: (NOTE) Lowest detectable limit for serum alcohol is 10 mg/dL.  For medical purposes only. Performed at [facility], [place] [address] [place], [place] [address]    TSH 01/24/2022 3.012  0.350 - 4.500 uIU/mL Final   Comment: Performed by a 3rd Generation assay with a functional sensitivity of <=0.01 uIU/mL. Performed at [facility], [place] [address] [place], [place] [address]    Color, Urine 01/24/2022 YELLOW  YELLOW Final   APPearance 01/24/2022 CLEAR  CLEAR Final   Specific Gravity, Urine 01/24/2022 1.006  1.005 - 1.030 Final   pH 01/24/2022 7.0  5.0 - 8.0 Final   Glucose, UA 01/24/2022 NEGATIVE  NEGATIVE mg/dL Final   Hgb urine dipstick 01/24/2022 NEGATIVE  NEGATIVE  Final   Bilirubin Urine 01/24/2022 NEGATIVE  NEGATIVE Final   Ketones, ur 01/24/2022 NEGATIVE  NEGATIVE mg/dL Final   Protein, ur 01/24/2022 NEGATIVE  NEGATIVE mg/dL Final   Nitrite 01/24/2022 NEGATIVE  NEGATIVE Final   Leukocytes,Ua 01/24/2022 NEGATIVE  NEGATIVE Final   Performed at [facility], [place] [address], [place],  [address]   Preg Test, Ur 01/24/2022 NEGATIVE  NEGATIVE Final   Comment:        THE SENSITIVITY OF THIS METHODOLOGY IS >20 mIU/mL. Performed at [facility], [place] [address], [place], [place] [address]    POC Amphetamine UR 01/24/2022 None Detected  NONE DETECTED (Cut Off Level 1000 ng/mL) Final   POC Secobarbital (BAR) 01/24/2022 None Detected  NONE DETECTED (Cut Off Level 300 ng/mL) Final   POC Buprenorphine (BUP) 01/24/2022 None Detected  NONE DETECTED (Cut Off Level 10 ng/mL) Final   POC Oxazepam (BZO) 01/24/2022 Positive (A)  NONE DETECTED (Cut Off Level 300 ng/mL) Final   POC Cocaine UR 01/24/2022 None Detected  NONE DETECTED (Cut Off Level 300 ng/mL) Final   POC Methamphetamine UR 01/24/2022 None Detected  NONE DETECTED (Cut Off Level 1000 ng/mL) Final   POC Morphine 01/24/2022 None Detected  NONE DETECTED (Cut Off Level 300 ng/mL) Final   POC Oxycodone UR 01/24/2022 None Detected  NONE DETECTED (Cut Off Level 100 ng/mL) Final   POC Methadone UR 01/24/2022 None Detected  NONE DETECTED (Cut Off Level 300 ng/mL) Final   POC Marijuana UR 01/24/2022 None Detected  NONE DETECTED (Cut Off Level 50 ng/mL) Final   Preg Test, Ur 01/24/2022 NEGATIVE  NEGATIVE Final   Comment:        THE SENSITIVITY OF THIS METHODOLOGY IS >24 mIU/mL    SARSCOV2ONAVIRUS 2 AG 01/24/2022 NEGATIVE  NEGATIVE Final   Comment: (NOTE) SARS-CoV-2 antigen NOT DETECTED.   Negative results are presumptive.  Negative results do not preclude SARS-CoV-2 infection and should not be used as the sole basis for treatment or other patient management decisions, including infection   control decisions, particularly in the presence of clinical signs and  symptoms consistent with COVID-19, or in those who have been in contact with the virus.  Negative results must be combined with clinical observations, patient history, and epidemiological information. The expected result is Negative.  Fact Sheet for Patients: HandmadeRecipes.com.cy  Fact Sheet for Healthcare Providers: FuneralLife.at  This test is not yet approved or cleared by the [place] FDA and  has been authorized for detection and/or diagnosis of SARS-CoV-2 by FDA under an Emergency Use Authorization (EUA).  This EUA will remain in effect (meaning this  test can be used) for the duration of  the COV                          ID-19 declaration under Section 564(b)(1) of the Act, 21 U.S.C. section 360bbb-3(b)(1), unless the authorization is terminated or revoked sooner.     Cholesterol 01/24/2022 137  0 - 200 mg/dL Final   Triglycerides 01/24/2022 189 (H)  <150 mg/dL Final   HDL 01/24/2022 32 (L)  >40 mg/dL Final   Total CHOL/HDL Ratio 01/24/2022 4.3  RATIO Final   VLDL 01/24/2022 38  0 - 40 mg/dL Final   LDL Cholesterol 01/24/2022 67  0 - 99 mg/dL Final   Comment:        Total Cholesterol/HDL:CHD Risk Coronary Heart Disease Risk Table                     Men   Women  1/2 Average Risk   3.4   3.3  Average Risk       5.0   4.4  2 X Average Risk   9.6   7.1  3 X Average Risk  23.4   11.0        Use the calculated Patient Ratio above and the CHD Risk Table to determine the patient's CHD Risk.        ATP III CLASSIFICATION (LDL):  <100     mg/dL   Optimal  100-129  mg/dL   Near or Above                    Optimal  130-159  mg/dL   Borderline  160-189  mg/dL   High  >190     mg/dL   Very High Performed at [facility] [address] [place] [address], [place], [place] [address]    Sodium 01/25/2022 139  135 - 145 mmol/L Final   Potassium 01/25/2022 4.0  3.5 -  5.1 mmol/L Final   NO VISIBLE HEMOLYSIS   Chloride 01/25/2022 114 (H)  98 - 111 mmol/L Final   CO2 01/25/2022 19 (L)  22 - 32 mmol/L Final   Glucose, Bld 01/25/2022 83  70 - 99 mg/dL Final   Glucose reference range applies only to samples taken after fasting for at least 8 hours.   BUN 01/25/2022 7  6 - 20 mg/dL Final   Creatinine, Ser 01/25/2022 0.53  0.44 - 1.00 mg/dL Final   Calcium 01/25/2022 8.4 (L)  8.9 - 10.3 mg/dL Final   Total Protein 01/25/2022 6.7  6.5 - 8.1 g/dL Final   Albumin 01/25/2022 3.1 (L)  3.5 - 5.0 g/dL Final   AST 01/25/2022 33  15 - 41 U/L Final   ALT 01/25/2022 19  0 - 44 U/L Final   Alkaline Phosphatase 01/25/2022 70  38 - 126 U/L Final   Total Bilirubin 01/25/2022 0.8  0.3 - 1.2 mg/dL Final   GFR, Estimated 01/25/2022 >60  >60 mL/min Final   Comment: (NOTE) Calculated using the CKD-EPI Creatinine Equation (2021)    Anion gap 01/25/2022 6  5 - 15 Final   Performed at [facility] [address] [place], [place] [address]  Admission on 10/07/2021, Discharged on 10/11/2021  Component Date Value Ref Range Status   POC Amphetamine UR 10/07/2021 None Detected  NONE DETECTED (Cut Off Level 1000 ng/mL) Final   POC Secobarbital (BAR) 10/07/2021 None Detected  NONE DETECTED (Cut Off Level  300 ng/mL) Final   POC Buprenorphine (BUP) 10/07/2021 None Detected  NONE DETECTED (Cut Off Level 10 ng/mL) Final   POC Oxazepam (BZO) 10/07/2021 Positive (A)  NONE DETECTED (Cut Off Level 300 ng/mL) Final   POC Cocaine UR 10/07/2021 None Detected  NONE DETECTED (Cut Off Level 300 ng/mL) Final   POC Methamphetamine UR 10/07/2021 None Detected  NONE DETECTED (Cut Off Level 1000 ng/mL) Final   POC Morphine 10/07/2021 None Detected  NONE DETECTED (Cut Off Level 300 ng/mL) Final   POC Oxycodone UR 10/07/2021 None Detected  NONE DETECTED (Cut Off Level 100 ng/mL) Final   POC Methadone UR 10/07/2021 None Detected  NONE DETECTED (Cut Off Level 300 ng/mL) Final   POC Marijuana UR  10/07/2021 None Detected  NONE DETECTED (Cut Off Level 50 ng/mL) Final  Admission on 10/06/2021, Discharged on 10/07/2021  Component Date Value Ref Range Status   Lipase 10/06/2021 93 (H)  11 - 51 U/L Final   Performed at [facility], [place] [address] [place], [place] [address]   Sodium 10/06/2021 140  135 - 145 mmol/L Final   Potassium 10/06/2021 3.3 (L)  3.5 - 5.1 mmol/L Final   Chloride 10/06/2021 110  98 - 111 mmol/L Final   CO2 10/06/2021 20 (L)  22 - 32 mmol/L Final   Glucose, Bld 10/06/2021 119 (H)  70 - 99 mg/dL Final   Glucose reference range applies only to samples taken after fasting for at least 8 hours.   BUN 10/06/2021 9  6 - 20 mg/dL Final   Creatinine, Ser 10/06/2021 0.52  0.44 - 1.00 mg/dL Final   Calcium 10/06/2021 8.4 (L)  8.9 - 10.3 mg/dL Final   Total Protein 10/06/2021 7.2  6.5 - 8.1 g/dL Final   Albumin 10/06/2021 3.1 (L)  3.5 - 5.0 g/dL Final   AST 10/06/2021 49 (H)  15 - 41 U/L Final   ALT 10/06/2021 24  0 - 44 U/L Final   Alkaline Phosphatase 10/06/2021 88  38 - 126 U/L Final   Total Bilirubin 10/06/2021 0.8  0.3 - 1.2 mg/dL Final   GFR, Estimated 10/06/2021 >60  >60 mL/min Final   Comment: (NOTE) Calculated using the CKD-EPI Creatinine Equation (2021)    Anion gap 10/06/2021 10  5 - 15 Final   Performed at [facility], [place] [address] [place], [place] [address]   WBC 10/06/2021 2.2 (L)  4.0 - 10.5 K/uL Final   RBC 10/06/2021 3.93  3.87 - 5.11 MIL/uL Final   Hemoglobin 10/06/2021 11.4 (L)  12.0 - 15.0 g/dL Final   HCT 10/06/2021 35.1 (L)  36.0 - 46.0 % Final   MCV 10/06/2021 89.3  80.0 - 100.0 fL Final   MCH 10/06/2021 29.0  26.0 - 34.0 pg Final   MCHC 10/06/2021 32.5  30.0 - 36.0 g/dL Final   RDW 10/06/2021 17.4 (H)  11.5 - 15.5 % Final   Platelets 10/06/2021 76 (L)  150 - 400 K/uL Final   Comment: SPECIMEN CHECKED FOR CLOTS Immature Platelet Fraction may be clinically indicated, consider ordering this additional  test [id number] REPEATED TO VERIFY PLATELET COUNT CONFIRMED BY SMEAR    nRBC 10/06/2021 0.0  0.0 - 0.2 % Final   Performed at [facility], [place] [address], [place], [place] [address]   Color, Urine 10/07/2021 YELLOW  YELLOW Final   APPearance 10/07/2021 CLOUDY (A)  CLEAR Final   Specific Gravity, Urine 10/07/2021 1.011  1.005 - 1.030 Final  pH 10/07/2021 5.0  5.0 - 8.0 Final   Glucose, UA 10/07/2021 NEGATIVE  NEGATIVE mg/dL Final   Hgb urine dipstick 10/07/2021 SMALL (A)  NEGATIVE Final   Bilirubin Urine 10/07/2021 NEGATIVE  NEGATIVE Final   Ketones, ur 10/07/2021 5 (A)  NEGATIVE mg/dL Final   Protein, ur 10/07/2021 NEGATIVE  NEGATIVE mg/dL Final   Nitrite 10/07/2021 NEGATIVE  NEGATIVE Final   Leukocytes,Ua 10/07/2021 LARGE (A)  NEGATIVE Final   RBC / HPF 10/07/2021 6-10  0 - 5 RBC/hpf Final   WBC, UA 10/07/2021 11-20  0 - 5 WBC/hpf Final   Bacteria, UA 10/07/2021 MANY (A)  NONE SEEN Final   Squamous Epithelial / LPF 10/07/2021 11-20  0 - 5 Final   Mucus 10/07/2021 PRESENT   Final   Performed at [facility], [place] [address], [place], [place] [address]   I-stat hCG, quantitative 10/06/2021 <5.0  <5 mIU/mL Final   Comment 3 10/06/2021          Final   Comment:   GEST. AGE      CONC.  (mIU/mL)   <=1 WEEK        5 - 50     2 WEEKS       50 - 500     3 WEEKS       100 - 10,000     4 WEEKS     1,000 - 30,000        FEMALE AND NON-PREGNANT FEMALE:     LESS THAN 5 mIU/mL    Alcohol, Ethyl (B) 10/06/2021 285 (H)  <10 mg/dL Final   Comment: (NOTE) Lowest detectable limit for serum alcohol is 10 mg/dL.  For medical purposes only. Performed at [facility], [place] [address], [place], [place] [address]    SARS Coronavirus 2 by RT PCR 10/07/2021 NEGATIVE  NEGATIVE Final   Comment: (NOTE) SARS-CoV-2 target nucleic acids are NOT DETECTED.  The SARS-CoV-2 RNA is generally detectable in upper respiratory specimens during the acute  phase of infection. The lowest concentration of SARS-CoV-2 viral copies this assay can detect is 138 copies/mL. A negative result does not preclude SARS-Cov-2 infection and should not be used as the sole basis for treatment or other patient management decisions. A negative result may occur with  improper specimen collection/handling, submission of specimen other than nasopharyngeal swab, presence of viral mutation(s) within the areas targeted by this assay, and inadequate number of viral copies(<138 copies/mL). A negative result must be combined with clinical observations, patient history, and epidemiological information. The expected result is Negative.  Fact Sheet for Patients:  EntrepreneurPulse.com.au  Fact Sheet for Healthcare Providers:  IncredibleEmployment.be  This test is no                          t yet approved or cleared by the [place] FDA and  has been authorized for detection and/or diagnosis of SARS-CoV-2 by FDA under an Emergency Use Authorization (EUA). This EUA will remain  in effect (meaning this test can be used) for the duration of the COVID-19 declaration under Section 564(b)(1) of the Act, 21 U.S.C.section 360bbb-3(b)(1), unless the authorization is terminated  or revoked sooner.       Influenza A by PCR 10/07/2021 NEGATIVE  NEGATIVE Final   Influenza B by PCR 10/07/2021 NEGATIVE  NEGATIVE Final   Comment: (NOTE) The Xpert Xpress SARS-CoV-2/FLU/RSV plus assay is intended as an aid in the diagnosis  of influenza from Nasopharyngeal swab specimens and should not be used as a sole basis for treatment. Nasal washings and aspirates are unacceptable for Xpert Xpress SARS-CoV-2/FLU/RSV testing.  Fact Sheet for Patients: EntrepreneurPulse.com.au  Fact Sheet for Healthcare Providers: IncredibleEmployment.be  This test is not yet approved or cleared by the [place] FDA and has  been authorized for detection and/or diagnosis of SARS-CoV-2 by FDA under an Emergency Use Authorization (EUA). This EUA will remain in effect (meaning this test can be used) for the duration of the COVID-19 declaration under Section 564(b)(1) of the Act, 21 U.S.C. section 360bbb-3(b)(1), unless the authorization is terminated or revoked.  Performed at [facility], [place] [address], [place], [place] [address]     Blood Alcohol level:  Lab Results  Component Value Date   ETH 201 (H) 01/24/2022   ETH 285 (H) 13/24/4010    Metabolic Disorder Labs: Lab Results  Component Value Date   HGBA1C 4.7 (L) 01/24/2022   MPG 88.19 01/24/2022   MPG 102.54 01/20/2021   Lab Results  Component Value Date   PROLACTIN 28.7 (H) 05/04/2017   Lab Results  Component Value Date   CHOL 137 01/24/2022   TRIG 189 (H) 01/24/2022   HDL 32 (L) 01/24/2022   CHOLHDL 4.3 01/24/2022   VLDL 38 01/24/2022   LDLCALC 67 01/24/2022   LDLCALC 83 01/20/2021    Therapeutic Lab Levels: No results found for: LITHIUM No results found for: VALPROATE No components found for:  CBMZ  Physical Findings   AIMS    Flowsheet Row Admission (Discharged) from 04/19/2019 in [place] [facility] Admission (Discharged) from 05/02/2017 in [place] [facility]  AIMS Total Score 0 0      AUDIT    Flowsheet Row Admission (Discharged) from 04/19/2019 in [place] [facility] Admission (Discharged) from 05/02/2017 in [place] [facility] Admission (Discharged) from 12/22/2014 in [place] [facility] Admission (Discharged) from 02/02/2014 in [place] [facility] Admission (Discharged) from 09/27/2013 in [place] [facility]  Alcohol Use Disorder Identification Test Final Score (AUDIT) 34 34 29 38 34      GAD-7    Flowsheet Row Video Visit  from 11/09/2021 in [facility] Video Visit from 07/08/2021 in [facility] Video Visit from 05/13/2021 in [facility] Video Visit from 04/02/2021 in [facility] Video Visit from 02/17/2021 in [facility]  Total GAD-7 Score '16 18 16 15 11      '$ PHQ2-9    Flowsheet Row Video Visit from 11/09/2021 in [facility] ED from 10/06/2021 in [place] DEPT Video Visit from 07/08/2021 in [facility] Video Visit from 05/13/2021 in [facility] Video Visit from 04/02/2021 in [place]  PHQ-2 Total Score '2 4 2 2 3  '$ PHQ-9 Total Score '16 15 9 10 15      '$ [place] ED from 01/24/2022 in [facility] ED from 01/01/2022 in [place] [facility] Video Visit from 11/09/2021 in [place] No Risk No Risk No Risk        Musculoskeletal  Strength & Muscle Tone: within normal limits Gait & Station: normal Patient leans: N/A  Psychiatric Specialty Exam  Presentation  General Appearance: Appropriate for Environment; Casual  Eye Contact:Good  Speech:Clear and Coherent; Normal Rate  Speech Volume:Normal  Handedness:Right   Mood and Affect  Mood:-- ("good")  Affect:Appropriate; Congruent   Thought Process  Thought Processes:Coherent; Goal Directed; Linear  Descriptions of Associations:Intact  Orientation:Full (Time, Place and Person)  Thought Content:WDL; Logical  Diagnosis of Schizophrenia or Schizoaffective disorder in past: No    Hallucinations:Hallucinations: None  Ideas of Reference:None  Suicidal Thoughts:Suicidal Thoughts: No  Homicidal Thoughts:Homicidal Thoughts: No   Sensorium  Memory:Immediate Good;  Recent Good; Remote Good  Judgment:Fair  Insight:Fair   Executive Functions  Concentration:Good  Attention Span:Good  [place] of Knowledge:Good  Language:Good   Psychomotor Activity  Psychomotor Activity:Psychomotor Activity: Normal   Assets  Assets:Communication Skills; Desire for Improvement; Resilience   Sleep  Sleep:Sleep: Fair   No data recorded  Physical Exam  Physical Exam Vitals and nursing note reviewed.  Cardiovascular:     Rate and Rhythm: Normal rate and regular rhythm.  Neurological:     Mental Status: She is oriented to person, place, and time.  Psychiatric:        Attention and Perception: Attention normal.        Mood and Affect: Mood normal.        Speech: Speech normal.        Behavior: Behavior normal.        Thought Content: Thought content normal.        Cognition and Memory: Cognition normal.        Judgment: Judgment normal.   Review of Systems  Eyes: Negative.   Cardiovascular: Negative.   Genitourinary: Negative.   Psychiatric/Behavioral:  Positive for substance abuse. Negative for depression. The patient is nervous/anxious.   All other systems reviewed and are negative. Blood pressure 96/60, pulse 68, temperature 98.1 F (36.7 C), temperature source Axillary, resp. rate 16, SpO2 98 %. There is no height or weight on file to calculate BMI.  Treatment Plan Summary: Daily contact with patient to assess and evaluate symptoms and progress in treatment and Medication management  Substance-induced mood disorder: Bipolar disorder: Alcohol abuse disorder:  Continue Lexapro 40 mg p.o. daily Continue with gabapentin 200 mg p.o. 3 times daily Continue hydroxyzine 25 mg p.o. 3 times daily as needed Continue Seroquel 100 mg p.o. nightly  Constipation: Continue MiraLAX 17 g packet. Continue milk of magnesia 30 ml  CSW to continue working on discharge disposition Anticipated discharge to [facility] on 01/31/2022 Patient  encouraged to participate within the therapeutic milieu  Derrill Center, NP 01/29/2022 11:36 AM

## 2022-01-29 NOTE — ED Notes (Signed)
Took pt outside ?

## 2022-01-29 NOTE — ED Notes (Signed)
Patient is currently in dining room attending AA ?

## 2022-01-29 NOTE — ED Notes (Signed)
Pt is currently sleeping, no distress noted, environmental check complete, will continue to monitor patient for safety. ? ?

## 2022-01-29 NOTE — ED Notes (Signed)
Pt is in dining room watching television. No distress noted, will continue to monitor patient for safety. ?

## 2022-01-29 NOTE — ED Notes (Addendum)
Patient expressed in wrap up group that she is ready for outpatient treatment and a clean and sober life. Patient also interacted and communicated in [place] group and gave positive feedback. Staff commended patient for sharing her thoughts and participating in group. ?

## 2022-01-29 NOTE — ED Notes (Signed)
Breakfast was given 

## 2022-01-29 NOTE — ED Notes (Signed)
Pt is currently in the dining room attending AA ?

## 2022-01-30 DIAGNOSIS — Z20822 Contact with and (suspected) exposure to covid-19: Secondary | ICD-10-CM | POA: Diagnosis not present

## 2022-01-30 DIAGNOSIS — F1020 Alcohol dependence, uncomplicated: Secondary | ICD-10-CM | POA: Diagnosis not present

## 2022-01-30 DIAGNOSIS — F319 Bipolar disorder, unspecified: Secondary | ICD-10-CM | POA: Diagnosis not present

## 2022-01-30 DIAGNOSIS — F419 Anxiety disorder, unspecified: Secondary | ICD-10-CM | POA: Diagnosis not present

## 2022-01-30 MED ORDER — QUETIAPINE FUMARATE 100 MG PO TABS: 100.0000 mg | ORAL_TABLET | Freq: Every day | ORAL | 0 refills | Status: DC

## 2022-01-30 MED ORDER — GABAPENTIN 100 MG PO CAPS
200.0000 mg | ORAL_CAPSULE | Freq: Three times a day (TID) | ORAL | 0 refills | Status: DC
  Filled 2022-01-30: qty 160, 27d supply, fill #0

## 2022-01-30 MED ORDER — TOPIRAMATE 100 MG PO TABS: 100.0000 mg | ORAL_TABLET | Freq: Two times a day (BID) | ORAL | 0 refills | Status: DC

## 2022-01-30 MED ORDER — HYDROXYZINE HCL 25 MG PO TABS: 25.0000 mg | ORAL_TABLET | Freq: Three times a day (TID) | ORAL | 0 refills | Status: DC | PRN

## 2022-01-30 NOTE — ED Notes (Signed)
Gave Pt. Snacks and turned the TV up. ?

## 2022-01-30 NOTE — Progress Notes (Signed)
Awake in dayroom watching tv.  Patient ate lunch earlier and has been without complaint or concern.  Denies avh shi or plan.  Patient aware of discharge pending for tomorrow to [facility].  Scripts and 30 med supply pending.   ?

## 2022-01-30 NOTE — ED Provider Notes (Addendum)
FBC/OBS ASAP Discharge Summary  Date and Time: 01/30/2022 3:19 PM  Name: Sara [name]  MRN:  [id number]   Discharge Diagnoses:  Final diagnoses:  Bipolar 1 disorder ([place])  Alcohol use disorder, severe, dependence ([place])    Subjective: "Sara [name] is a 46 yo female w PMH significant for bipolar disorder, anxiety, PTSD, misuse of benzodiazepines,and alcohol use disorder who was admitted to the [facility] on 01/24/2022 requesting alcohol treatment. Etoh 201; UDS +bzd. "  Stay Summary:  Sara [name] was seen and evaluated face-to-face.  Continues to deny suicidal or homicidal ideations.  Denies auditory or visual hallucinations.  Reports her depression has stabilized.  She anticipates discharging to follow-up with residential treatment facility on 02/01/2019.  Denies medication side effects during this assessment.  She reports her appetite has improved.  States she is resting well throughout the night.  Patient to keep all follow-up outpatient appointments after residential treatment.  Support, encouragement and reassurance was provided.  During evaluation Sara [name] is sitting in dayroom in no acute distress. She is alert/oriented x 4; calm/cooperative; and mood congruent with affect.  She is speaking in a clear tone at moderate volume, and normal pace; with good eye contact. Her thought process is coherent and relevant; There is no indication that she is currently responding to internal/external stimuli or experiencing delusional thought content; and she has denied suicidal/self-harm/homicidal ideation, psychosis, and paranoia.   Patient has remained calm throughout assessment and has answered questions appropriately.     At this time Sara [name] is educated and verbalizes understanding of mental health resources and other crisis services in the community. She is instructed to call 911 and present to the nearest emergency room should she experience any suicidal/homicidal ideation,  auditory/visual/hallucinations, or detrimental worsening of her mental health condition.   She was a also advised by Probation officer that she could call the toll-free phone on insurance card to assist with identifying in network counselors and agencies or number on back of Medicaid card to speak with care coordinator    Total Time spent with patient: 15 minutes  Past Psychiatric History:  Past Medical History:  Past Medical History:  Diagnosis Date   Alcohol abuse    Alcoholism ([place])    Anemia    Anxiety    Blood transfusion without reported diagnosis    Cirrhosis ([place])    Depression    Esophageal varices with bleeding(456.0) 06/13/2014   GERD (gastroesophageal reflux disease)    Heart murmur    Patient states she may have   Menorrhagia    Pancytopenia ([place]) 01/15/2014   Pneumonia    Portal hypertension ([place])    S/P alcohol detoxification    2-3 days at behavioral health previously   UGI bleed 06/12/2014    Past Surgical History:  Procedure Laterality Date   CHOLECYSTECTOMY     ESOPHAGOGASTRODUODENOSCOPY N/A 06/12/2014   Procedure: ESOPHAGOGASTRODUODENOSCOPY (EGD);  Surgeon: Gatha Mayer, MD;  Location: [facility];  Service: Endoscopy;  Laterality: N/A;   ESOPHAGOGASTRODUODENOSCOPY (EGD) WITH PROPOFOL N/A 07/29/2014   Procedure: ESOPHAGOGASTRODUODENOSCOPY (EGD) WITH PROPOFOL;  Surgeon: Inda Castle, MD;  Location: [facility];  Service: Endoscopy;  Laterality: N/A;   ESOPHAGOGASTRODUODENOSCOPY (EGD) WITH PROPOFOL N/A 01/20/2018   Procedure: ESOPHAGOGASTRODUODENOSCOPY (EGD) WITH PROPOFOL;  Surgeon: Mauri Pole, MD;  Location: [facility];  Service: Endoscopy;  Laterality: N/A;   Family History:  Family History  Problem Relation Age of Onset   Colon polyps Mother    Hypertension Mother  Thyroid disease Mother    Alcoholism Mother    Alcoholism Father    Alcohol abuse Maternal Grandfather    Alcohol abuse Paternal Grandfather    Alcohol abuse Paternal Aunt     Alcohol abuse Maternal Uncle    Family Psychiatric History:  Social History:  Social History   Substance and Sexual Activity  Alcohol Use Yes   Alcohol/week: 0.0 standard drinks   Comment: Usually drinks 2-3 bottles of wine daily when drinking.      Social History   Substance and Sexual Activity  Drug Use No   Types: Cocaine, Marijuana   Comment: denies    Social History   Socioeconomic History   Marital status: Married    Spouse name: [name]   Number of children: 2   Years of education: Associates   Highest education level: Not on file  Occupational History   Occupation: Paralegal  Tobacco Use   Smoking status: Former    Packs/day: 0.25    Types: Cigarettes    Quit date: 11/22/2018    Years since quitting: 3.1   Smokeless tobacco: Never  Substance and Sexual Activity   Alcohol use: Yes    Alcohol/week: 0.0 standard drinks    Comment: Usually drinks 2-3 bottles of wine daily when drinking.    Drug use: No    Types: Cocaine, Marijuana    Comment: denies   Sexual activity: Never    Birth control/protection: None  Other Topics Concern   Not on file  Social History Narrative   Lives with husband and son. Daughter lives nearby. 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   Social Determinants of Health   Financial Resource Strain: Not on file  Food Insecurity: Not on file  Transportation Needs: Not on file  Physical Activity: Not on file  Stress: Not on file  Social Connections: Not on file   SDOH:  SDOH Screenings   Alcohol Screen: Not on file  Depression (PHQ2-9): Medium Risk   PHQ-2 Score: 16  Financial Resource Strain: Not on file  Food Insecurity: Not on file  Housing: Not on file  Physical Activity: Not on file  Social Connections: Not on file  Stress: Not on file  Tobacco Use: Medium Risk   Smoking Tobacco Use: Former   Smokeless Tobacco Use: Never   Passive Exposure: Not on file  Transportation Needs: Not on file     Tobacco Cessation:  A prescription for an FDA-approved tobacco cessation medication provided at discharge  Current Medications:  Current Facility-Administered Medications  Medication Dose Route Frequency Provider Last Rate Last Admin   escitalopram (LEXAPRO) tablet 40 mg  40 mg Oral Daily Evette Georges, NP   40 mg at 01/30/22 8366   gabapentin (NEURONTIN) capsule 200 mg  200 mg Oral TID Evette Georges, NP   200 mg at 01/30/22 2947   hydrOXYzine (ATARAX) tablet 25 mg  25 mg Oral TID PRN Evette Georges, NP   25 mg at 01/29/22 2141   magnesium hydroxide (MILK OF MAGNESIA) suspension 30 mL  30 mL Oral Daily PRN Ival Bible, MD       multivitamin with minerals tablet 1 tablet  1 tablet Oral Daily Evette Georges, NP   1 tablet at 01/30/22 0928   ondansetron (ZOFRAN-ODT) disintegrating tablet 4 mg  4 mg Oral Q8H PRN Ival Bible, MD   4 mg at 01/28/22 1634   polyethylene glycol (MIRALAX / GLYCOLAX) packet 17 g  17 g  Oral Daily PRN Ival Bible, MD   17 g at 01/29/22 1317   QUEtiapine (SEROQUEL) tablet 100 mg  100 mg Oral QHS Ival Bible, MD   100 mg at 01/29/22 2138   thiamine tablet 100 mg  100 mg Oral Daily Evette Georges, NP   100 mg at 01/30/22 6659   topiramate (TOPAMAX) tablet 100 mg  100 mg Oral BID Evette Georges, NP   100 mg at 01/30/22 9357   Current Outpatient Medications  Medication Sig Dispense Refill   escitalopram (LEXAPRO) 20 MG tablet Take 2 tablets (40 mg total) by mouth daily. 60 tablet 1   Multiple Vitamin (MULTIVITAMIN WITH MINERALS) TABS tablet Take 1 tablet by mouth daily.     gabapentin (NEURONTIN) 100 MG capsule Take 2 capsules (200 mg total) by mouth 3 (three) times daily. 160 capsule 0   hydrOXYzine (ATARAX) 25 MG tablet Take 1 tablet (25 mg total) by mouth 3 (three) times daily as needed for anxiety. 30 tablet 0   QUEtiapine (SEROQUEL) 100 MG tablet Take 1 tablet (100 mg total) by mouth at bedtime. 30 tablet 0   topiramate (TOPAMAX) 100  MG tablet Take 1 tablet (100 mg total) by mouth 2 (two) times daily. 60 tablet 0    PTA Medications: (Not in a hospital admission)   Musculoskeletal  Strength & Muscle Tone: within normal limits Gait & Station: normal Patient leans: N/A  Psychiatric Specialty Exam  Presentation  General Appearance: Appropriate for Environment; Casual  Eye Contact:Good  Speech:Clear and Coherent; Normal Rate  Speech Volume:Normal  Handedness:Right   Mood and Affect  Mood:-- ("good")  Affect:Appropriate; Congruent   Thought Process  Thought Processes:Coherent; Goal Directed; Linear  Descriptions of Associations:Intact  Orientation:Full (Time, Place and Person)  Thought Content:WDL; Logical  Diagnosis of Schizophrenia or Schizoaffective disorder in past: No    Hallucinations:No data recorded Ideas of Reference:None  Suicidal Thoughts:No data recorded Homicidal Thoughts:No data recorded  Sensorium  Memory:Immediate Good; Recent Good; Remote Good  Judgment:Fair  Insight:Fair   Executive Functions  Concentration:Good  Attention Span:Good  New Home  Language:Good   Psychomotor Activity  Psychomotor Activity:No data recorded  Assets  Assets:Communication Skills; Desire for Improvement; Resilience   Sleep  Sleep:No data recorded  No data recorded  Physical Exam  Physical Exam Vitals and nursing note reviewed.  Cardiovascular:     Rate and Rhythm: Normal rate and regular rhythm.  Neurological:     Mental Status: She is alert and oriented to person, place, and time.  Psychiatric:        Mood and Affect: Mood normal.        Thought Content: Thought content normal.   Review of Systems  Psychiatric/Behavioral:  Positive for depression and substance abuse. Negative for hallucinations and suicidal ideas. The patient is nervous/anxious. The patient does not have insomnia.   All other systems reviewed and are negative. Blood pressure  101/76, pulse 74, temperature 98.2 F (36.8 C), temperature source Temporal, resp. rate 14, SpO2 99 %. There is no height or weight on file to calculate BMI.  Demographic Factors:  NA  Loss Factors: NA  Historical Factors: Family history of mental illness or substance abuse  Risk Reduction Factors:   Sense of responsibility to family, Living with another person, especially a relative, Positive social support, and Positive therapeutic relationship  Continued Clinical Symptoms:  Alcohol/Substance Abuse/Dependencies  Cognitive Features That Contribute To Risk:  Closed-mindedness    Suicide Risk:  Minimal: No identifiable suicidal ideation.  Patients presenting with no risk factors but with morbid ruminations; may be classified as minimal risk based on the severity of the depressive symptoms  Plan Of Care/Follow-up recommendations:  Activity:  as tolerated Diet:  heart healthy   Disposition: Take all medications as prescribed. Keep all follow-up appointments as scheduled.  Do not consume alcohol or use illegal drugs while on prescription medications. Report any adverse effects from your medications to your primary care provider promptly.  In the event of recurrent symptoms or worsening symptoms, call 911, a crisis hotline, or go to the nearest emergency department for evaluation.    Derrill Center, NP 01/30/2022, 3:19 PM

## 2022-01-30 NOTE — Discharge Instructions (Addendum)
Take all medications as prescribed. Keep all follow-up appointments as scheduled.  Do not consume alcohol or use illegal drugs while on prescription medications. Report any adverse effects from your medications to your primary care provider promptly.  In the event of recurrent symptoms or worsening symptoms, call 911, a crisis hotline, or go to the nearest emergency department for evaluation.   

## 2022-01-30 NOTE — ED Notes (Signed)
Patient denies [place].  Patient is cooperative and interacts well with staff. Respiratory is even and unlabored. No distress noted. Patient is watching TV in the dinning room at present. Patient stated no complaints at present. will continue to monitor for safety.  ?

## 2022-01-30 NOTE — ED Notes (Signed)
Patient is asleep in bed without issue or complaint.  No acute respiratory distress noted.  No signs of Etoh withdrawal presently.  Will monitor and provide support as needed. ?

## 2022-01-30 NOTE — ED Notes (Signed)
Pt complained of anxiety to nurse. Pt requested for medication to help with anxiety. ?

## 2022-01-30 NOTE — ED Notes (Signed)
[place] given ?

## 2022-01-30 NOTE — ED Notes (Signed)
Pt is currently sleeping, no distress noted, environmental check complete, will continue to monitor patient for safety. ? ?

## 2022-01-30 NOTE — Progress Notes (Signed)
Patient awake now, ate breakfast and met with provider.  No complaints.  Minimally verbal with fair eye contact.  Mood sad, affect constricted, thought process organized.  Denies avh shi or plan.  Will monitor.   ?

## 2022-01-31 ENCOUNTER — Other Ambulatory Visit: Payer: Self-pay

## 2022-01-31 ENCOUNTER — Encounter: Payer: Self-pay | Admitting: Hematology and Oncology

## 2022-01-31 DIAGNOSIS — Z20822 Contact with and (suspected) exposure to covid-19: Secondary | ICD-10-CM | POA: Diagnosis not present

## 2022-01-31 DIAGNOSIS — F419 Anxiety disorder, unspecified: Secondary | ICD-10-CM | POA: Diagnosis not present

## 2022-01-31 DIAGNOSIS — F1020 Alcohol dependence, uncomplicated: Secondary | ICD-10-CM | POA: Diagnosis not present

## 2022-01-31 DIAGNOSIS — F319 Bipolar disorder, unspecified: Secondary | ICD-10-CM | POA: Diagnosis not present

## 2022-01-31 NOTE — ED Notes (Signed)
Pt is in the bed sleeping. Respirations are even and unlabored. No acute distress noted. Will continue to monitor for safety. 

## 2022-01-31 NOTE — ED Notes (Signed)
Patient is asleep at pr3esent time.  She is pending discharge [facility] this morning.   ?

## 2022-02-01 ENCOUNTER — Encounter: Payer: Self-pay | Admitting: Hematology and Oncology

## 2022-02-01 ENCOUNTER — Other Ambulatory Visit: Payer: Self-pay

## 2022-02-01 MED ORDER — HYDROXYZINE HCL 25 MG PO TABS
25.0000 mg | ORAL_TABLET | Freq: Three times a day (TID) | ORAL | 1 refills | Status: DC
  Filled 2022-02-01: qty 30, 10d supply, fill #0

## 2022-02-01 MED ORDER — QUETIAPINE FUMARATE 100 MG PO TABS
100.0000 mg | ORAL_TABLET | Freq: Every evening | ORAL | 1 refills | Status: DC
  Filled 2022-02-01: qty 30, 30d supply, fill #0

## 2022-02-01 MED ORDER — ESCITALOPRAM OXALATE 20 MG PO TABS
40.0000 mg | ORAL_TABLET | Freq: Every day | ORAL | 1 refills | Status: DC
Start: 2022-02-01 — End: 2022-03-01
  Filled 2022-02-01: qty 60, 30d supply, fill #0

## 2022-02-01 MED ORDER — TOPIRAMATE 100 MG PO TABS
100.0000 mg | ORAL_TABLET | Freq: Two times a day (BID) | ORAL | 1 refills | Status: DC
  Filled 2022-02-01: qty 60, 30d supply, fill #0

## 2022-02-02 ENCOUNTER — Other Ambulatory Visit: Payer: Self-pay

## 2022-02-07 ENCOUNTER — Other Ambulatory Visit: Payer: Self-pay

## 2022-02-09 ENCOUNTER — Telehealth (HOSPITAL_COMMUNITY): Payer: Self-pay | Admitting: Family Medicine

## 2022-02-09 NOTE — BH Assessment (Signed)
Care Management - [place] Follow Up Discharges  ? ?Patient was informed by the patient's husband that she is currently inpatient at [facility].  ?

## 2022-02-24 ENCOUNTER — Telehealth (HOSPITAL_COMMUNITY): Payer: Self-pay | Admitting: *Deleted

## 2022-02-24 ENCOUNTER — Other Ambulatory Visit: Payer: Self-pay

## 2022-02-24 NOTE — Telephone Encounter (Signed)
VM from patient stating she is out of her vistaril and has an appt coming up on the 11 th but needs her medicine now. Reviewed record, she has a refill available to her at [facility] and called her back and left this info on her VM. ?

## 2022-03-01 ENCOUNTER — Encounter (HOSPITAL_COMMUNITY): Payer: Self-pay | Admitting: Physician Assistant

## 2022-03-01 ENCOUNTER — Telehealth (INDEPENDENT_AMBULATORY_CARE_PROVIDER_SITE_OTHER): Payer: No Payment, Other | Admitting: Physician Assistant

## 2022-03-01 DIAGNOSIS — F411 Generalized anxiety disorder: Secondary | ICD-10-CM | POA: Diagnosis not present

## 2022-03-01 DIAGNOSIS — F1010 Alcohol abuse, uncomplicated: Secondary | ICD-10-CM | POA: Diagnosis not present

## 2022-03-01 DIAGNOSIS — F319 Bipolar disorder, unspecified: Secondary | ICD-10-CM | POA: Diagnosis not present

## 2022-03-01 MED ORDER — QUETIAPINE FUMARATE 100 MG PO TABS
100.0000 mg | ORAL_TABLET | Freq: Every day | ORAL | 2 refills | Status: DC
  Filled 2022-03-01 – 2022-03-14 (×2): qty 30, 30d supply, fill #0

## 2022-03-01 MED ORDER — HYDROXYZINE HCL 25 MG PO TABS
25.0000 mg | ORAL_TABLET | Freq: Three times a day (TID) | ORAL | 1 refills | Status: DC | PRN
  Filled 2022-03-01 – 2022-03-14 (×2): qty 90, 30d supply, fill #0

## 2022-03-01 MED ORDER — ESCITALOPRAM OXALATE 20 MG PO TABS
40.0000 mg | ORAL_TABLET | Freq: Every day | ORAL | 2 refills | Status: DC
  Filled 2022-03-01 – 2022-03-14 (×2): qty 60, 30d supply, fill #0

## 2022-03-01 MED ORDER — TOPIRAMATE 100 MG PO TABS
100.0000 mg | ORAL_TABLET | Freq: Two times a day (BID) | ORAL | 2 refills | Status: DC
  Filled 2022-03-01 – 2022-03-14 (×2): qty 60, 30d supply, fill #0

## 2022-03-01 MED ORDER — GABAPENTIN 100 MG PO CAPS
200.0000 mg | ORAL_CAPSULE | Freq: Three times a day (TID) | ORAL | 2 refills | Status: DC
  Filled 2022-03-01 – 2022-03-14 (×2): qty 180, 30d supply, fill #0

## 2022-03-01 NOTE — Progress Notes (Signed)
BH MD/PA/NP OP Progress Note ? ?Virtual Visit via Telephone Note ? ?I connected with Sara [name] on 03/01/22 at  5:00 PM EDT by telephone and verified that I am speaking with the correct person using two identifiers. ? ?Location: ?Patient: Home ?Provider: Clinic ?  ?I discussed the limitations, risks, security and privacy concerns of performing an evaluation and management service by telephone and the availability of in person appointments. I also discussed with the patient that there may be a patient responsible charge related to this service. The patient expressed understanding and agreed to proceed. ? ?Follow Up Instructions: ?  ?I discussed the assessment and treatment plan with the patient. The patient was provided an opportunity to ask questions and all were answered. The patient agreed with the plan and demonstrated an understanding of the instructions. ?  ?The patient was advised to call back or seek an in-person evaluation if the symptoms worsen or if the condition fails to improve as anticipated. ? ?I provided 12 minutes of non-face-to-face time during this encounter. ? ?[name], PA ? ? ?03/01/2022 11:30 PM ?[place]  ?MRN:  [id number] ? ?Chief Complaint:  ?Chief Complaint  ?Patient presents with  ? Follow-up  ? Medication Refill  ? ?HPI:  ? ?Sara [name] is a 46 year old female with a past psychiatric history significant for generalized anxiety disorder, bipolar 1 disorder, and alcohol abuse who presents to [facility] via virtual telephone visit for follow-up and medication management.  Patient is currently being managed on the following medications: ? ?Seroquel 100 mg at bedtime ?Lexapro 40 mg daily ?Topiramate 100 mg 2 times daily ?Gabapentin 200 mg 3 times daily ?Hydroxyzine 25 mg 3 times daily as needed ? ?Patient reports that she was recently admitted for detox and alcohol abuse back in March. Per chart review, patient was  admitted to [facility] on 01/24/[facility]22 requesting alcohol treatment.  During her initial assessment, patient had an alcohol level of 201 and a urine drug screen positive for benzodiazepines. Patient has since been discharged and states that she is doing well.  Patient reports that she has been taking her medications as prescribed.  She reports that while she was admitted, she was given hydroxyzine for the management of her anxiety disorder and panic attacks which she states was helpful.  Patient denies depressive symptoms.  She continues to endorse some anxiety and rates her anxiety at 4 out of 10.  Patient's current stressors include moving and preparing for her daughter to get married.  A GAD-7 screen was performed with the patient going at 6. ? ?Patient is alert and oriented x4, pleasant, calm, cooperative, and fully engaged in conversation during the encounter.  Patient endorses okay mood.  Patient denies suicidal or homicidal ideations.  She further denies auditory or visual hallucinations and does not appear to be responding to internal/external stimuli.  Patient endorses good sleep and receives on average 8 hours of sleep each night.  Patient endorses good appetite and eats on average 3 meals per day.  Patient denies alcohol consumption, tobacco use, and illicit drug use. ? ?Visit Diagnosis:  ?  ICD-10-CM   ?1. Alcohol abuse  F10.10   ?  ?2. Generalized anxiety disorder  F41.1 escitalopram (LEXAPRO) 20 MG tablet  ?  hydrOXYzine (ATARAX) 25 MG tablet  ?  gabapentin (NEURONTIN) 100 MG capsule  ?  ?3. Bipolar 1 disorder (HCC)  F31.9 escitalopram (LEXAPRO) 20 MG tablet  ?  QUEtiapine (SEROQUEL) 100  MG tablet  ?  topiramate (TOPAMAX) 100 MG tablet  ?  ? ? ?Past Psychiatric History:  ?Generalized Anxiety Disorder ?Bipolar Disorder ?PTSD ? ?Past Medical History:  ?Past Medical History:  ?Diagnosis Date  ? Alcohol abuse   ? Alcoholism ([place])   ? Anemia   ? Anxiety   ? Blood transfusion without reported diagnosis   ? Cirrhosis  ([place])   ? Depression   ? Esophageal varices with bleeding(456.0) 06/13/2014  ? GERD (gastroesophageal reflux disease)   ? Heart murmur   ? Patient states she may have  ? Menorrhagia   ? Pancytopenia ([employer]) 01/15/2014  ? Pneumonia   ? Portal hypertension (HCC)   ? S/P alcohol detoxification   ? 2-3 days at behavioral health previously  ? UGI bleed 06/12/2014  ?  ?Past Surgical History:  ?Procedure Laterality Date  ? CHOLECYSTECTOMY    ? ESOPHAGOGASTRODUODENOSCOPY N/A 06/12/2014  ? Procedure: ESOPHAGOGASTRODUODENOSCOPY (EGD);  Surgeon: Gatha Mayer, MD;  Location: [facility];  Service: Endoscopy;  Laterality: N/A;  ? ESOPHAGOGASTRODUODENOSCOPY (EGD) WITH PROPOFOL N/A 07/29/2014  ? Procedure: ESOPHAGOGASTRODUODENOSCOPY (EGD) WITH PROPOFOL;  Surgeon: Inda Castle, MD;  Location: [facility];  Service: Endoscopy;  Laterality: N/A;  ? ESOPHAGOGASTRODUODENOSCOPY (EGD) WITH PROPOFOL N/A 01/20/2018  ? Procedure: ESOPHAGOGASTRODUODENOSCOPY (EGD) WITH PROPOFOL;  Surgeon: Mauri Pole, MD;  Location: [facility] ENDOSCOPY;  Service: Endoscopy;  Laterality: N/A;  ? ? ?Family Psychiatric History:  ?None ? ?Family History:  ?Family History  ?Problem Relation Age of Onset  ? Colon polyps Mother   ? Hypertension Mother   ? Thyroid disease Mother   ? Alcoholism Mother   ? Alcoholism Father   ? Alcohol abuse Maternal Grandfather   ? Alcohol abuse Paternal Grandfather   ? Alcohol abuse Paternal Aunt   ? Alcohol abuse Maternal Uncle   ? ? ?Social History:  ?Social History  ? ?Socioeconomic History  ? Marital status: Married  ?  Spouse name: [name]  ? Number of children: 2  ? Years of education: Associates  ? Highest education level: Not on file  ?Occupational History  ? Occupation: Paralegal  ?Tobacco Use  ? Smoking status: Former  ?  Packs/day: 0.25  ?  Types: Cigarettes  ?  Quit date: 11/22/2018  ?  Years since quitting: 3.2  ? Smokeless tobacco: Never  ?Substance and Sexual Activity  ? Alcohol use: Yes  ?  Alcohol/week: 0.0 standard  drinks  ?  Comment: Usually drinks 2-3 bottles of wine daily when drinking.   ? Drug use: No  ?  Types: Cocaine, Marijuana  ?  Comment: denies  ? Sexual activity: Never  ?  Birth control/protection: None  ?Other Topics Concern  ? Not on file  ?Social History Narrative  ? Lives with husband and son. Daughter lives nearby. 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  ? ?Social Determinants of Health  ? ?Financial Resource Strain: Not on file  ?Food Insecurity: Not on file  ?Transportation Needs: Not on file  ?Physical Activity: Not on file  ?Stress: Not on file  ?Social Connections: Not on file  ? ? ?Allergies:  ?Allergies  ?Allergen Reactions  ? Morphine And Related Other (See Comments)  ?  Slowed HR, lowered BP  ? Nsaids Other (See Comments)  ?  Caused internal bleeding  ? ? ?Metabolic Disorder Labs: ?Lab Results  ?Component Value Date  ? HGBA1C 4.7 (L) 01/24/2022  ? MPG 88.19 01/24/2022  ? MPG 102.54 01/20/2021  ? ?  Lab Results  ?Component Value Date  ? PROLACTIN 28.7 (H) 05/04/2017  ? ?Lab Results  ?Component Value Date  ? CHOL 137 01/24/2022  ? TRIG 189 (H) 01/24/2022  ? HDL 32 (L) 01/24/2022  ? CHOLHDL 4.3 01/24/2022  ? VLDL 38 01/24/2022  ? [place] 67 01/24/2022  ? [place] 83 01/20/2021  ? ?Lab Results  ?Component Value Date  ? TSH 3.012 01/24/2022  ? TSH 1.153 01/20/2021  ? ? ?Therapeutic Level Labs: ?No results found for: LITHIUM ?No results found for: VALPROATE ?No components found for:  CBMZ ? ?Current Medications: ?Current Outpatient Medications  ?Medication Sig Dispense Refill  ? escitalopram (LEXAPRO) 20 MG tablet Take 2 tablets (40 mg total) by mouth daily. 60 tablet 2  ? gabapentin (NEURONTIN) 100 MG capsule Take 2 capsules (200 mg total) by mouth 3 (three) times daily. 180 capsule 2  ? hydrOXYzine (ATARAX) 25 MG tablet Take 1 tablet (25 mg total) by mouth 3 (three) times daily as needed for anxiety 30 tablet 1  ? hydrOXYzine (ATARAX) 25 MG tablet Take 1 tablet (25 mg total) by mouth 3  (three) times daily as needed for anxiety. 90 tablet 1  ? Multiple Vitamin (MULTIVITAMIN WITH MINERALS) TABS tablet Take 1 tablet by mouth daily.    ? QUEtiapine (SEROQUEL) 100 MG tablet Take 1 tablet (10

## 2022-03-02 ENCOUNTER — Encounter: Payer: Self-pay | Admitting: Hematology and Oncology

## 2022-03-02 ENCOUNTER — Other Ambulatory Visit: Payer: Self-pay

## 2022-03-03 ENCOUNTER — Other Ambulatory Visit: Payer: Self-pay

## 2022-03-09 ENCOUNTER — Other Ambulatory Visit: Payer: Self-pay

## 2022-03-14 ENCOUNTER — Other Ambulatory Visit: Payer: Self-pay

## 2022-03-14 ENCOUNTER — Encounter: Payer: Self-pay | Admitting: Hematology and Oncology

## 2022-03-16 ENCOUNTER — Other Ambulatory Visit: Payer: Self-pay

## 2022-04-07 ENCOUNTER — Emergency Department (HOSPITAL_COMMUNITY): Payer: Self-pay

## 2022-04-07 ENCOUNTER — Ambulatory Visit (HOSPITAL_COMMUNITY)
Admission: EM | Admit: 2022-04-07 | Discharge: 2022-04-08 | Disposition: A | Discharge status: Other Acute Inpatient Hospital | Payer: No Payment, Other | Attending: Family | Admitting: Family

## 2022-04-07 ENCOUNTER — Emergency Department (HOSPITAL_COMMUNITY)
Admission: EM | Admit: 2022-04-07 | Discharge: 2022-04-07 | Disposition: A | Discharge status: Other Acute Inpatient Hospital | Payer: Self-pay | Attending: Emergency Medicine | Admitting: Emergency Medicine

## 2022-04-07 ENCOUNTER — Encounter (HOSPITAL_COMMUNITY): Payer: Self-pay

## 2022-04-07 DIAGNOSIS — F332 Major depressive disorder, recurrent severe without psychotic features: Secondary | ICD-10-CM | POA: Insufficient documentation

## 2022-04-07 DIAGNOSIS — Z046 Encounter for general psychiatric examination, requested by authority: Secondary | ICD-10-CM | POA: Insufficient documentation

## 2022-04-07 DIAGNOSIS — Z1330 Encounter for screening examination for mental health and behavioral disorders, unspecified: Secondary | ICD-10-CM | POA: Insufficient documentation

## 2022-04-07 DIAGNOSIS — D72819 Decreased white blood cell count, unspecified: Secondary | ICD-10-CM | POA: Insufficient documentation

## 2022-04-07 DIAGNOSIS — R45851 Suicidal ideations: Secondary | ICD-10-CM | POA: Insufficient documentation

## 2022-04-07 DIAGNOSIS — Y939 Activity, unspecified: Secondary | ICD-10-CM | POA: Insufficient documentation

## 2022-04-07 DIAGNOSIS — Z23 Encounter for immunization: Secondary | ICD-10-CM | POA: Insufficient documentation

## 2022-04-07 DIAGNOSIS — R45850 Homicidal ideations: Secondary | ICD-10-CM | POA: Insufficient documentation

## 2022-04-07 DIAGNOSIS — Z20822 Contact with and (suspected) exposure to covid-19: Secondary | ICD-10-CM | POA: Insufficient documentation

## 2022-04-07 DIAGNOSIS — Z79899 Other long term (current) drug therapy: Secondary | ICD-10-CM | POA: Insufficient documentation

## 2022-04-07 DIAGNOSIS — S0083XA Contusion of other part of head, initial encounter: Secondary | ICD-10-CM | POA: Insufficient documentation

## 2022-04-07 DIAGNOSIS — W268XXA Contact with other sharp object(s), not elsewhere classified, initial encounter: Secondary | ICD-10-CM | POA: Insufficient documentation

## 2022-04-07 DIAGNOSIS — R0902 Hypoxemia: Secondary | ICD-10-CM | POA: Insufficient documentation

## 2022-04-07 DIAGNOSIS — X781XXA Intentional self-harm by knife, initial encounter: Secondary | ICD-10-CM | POA: Insufficient documentation

## 2022-04-07 DIAGNOSIS — S51812A Laceration without foreign body of left forearm, initial encounter: Secondary | ICD-10-CM | POA: Insufficient documentation

## 2022-04-07 DIAGNOSIS — D696 Thrombocytopenia, unspecified: Secondary | ICD-10-CM | POA: Insufficient documentation

## 2022-04-07 DIAGNOSIS — F1094 Alcohol use, unspecified with alcohol-induced mood disorder: Secondary | ICD-10-CM | POA: Insufficient documentation

## 2022-04-07 LAB — COMPREHENSIVE METABOLIC PANEL
ALT: 21 U/L (ref 0–44)
AST: 37 U/L (ref 15–41)
Albumin: 3.4 g/dL — ABNORMAL LOW (ref 3.5–5.0)
Alkaline Phosphatase: 74 U/L (ref 38–126)
Anion gap: 11 (ref 5–15)
BUN: 7 mg/dL (ref 6–20)
CO2: 21 mmol/L — ABNORMAL LOW (ref 22–32)
Calcium: 8.3 mg/dL — ABNORMAL LOW (ref 8.9–10.3)
Chloride: 112 mmol/L — ABNORMAL HIGH (ref 98–111)
Creatinine, Ser: 0.56 mg/dL (ref 0.44–1.00)
GFR, Estimated: >60 mL/min (ref >60)
Glucose, Bld: 107 mg/dL — ABNORMAL HIGH (ref 70–99)
Potassium: 3.2 mmol/L — ABNORMAL LOW (ref 3.5–5.1)
Sodium: 144 mmol/L (ref 135–145)
Total Bilirubin: 0.9 mg/dL (ref 0.3–1.2)
Total Protein: 7.5 g/dL (ref 6.5–8.1)

## 2022-04-07 LAB — I-STAT BETA HCG BLOOD, ED (MC, WL, AP ONLY): I-stat hCG, quantitative: <5 m[IU]/mL (ref <5)

## 2022-04-07 LAB — ACETAMINOPHEN LEVEL: Acetaminophen (Tylenol), Serum: <10 ug/mL — ABNORMAL LOW (ref 10–30)

## 2022-04-07 LAB — CBC
HCT: 36.7 % (ref 36.0–46.0)
Hemoglobin: 12.7 g/dL (ref 12.0–15.0)
MCH: 30.8 pg (ref 26.0–34.0)
MCHC: 34.6 g/dL (ref 30.0–36.0)
MCV: 88.9 fL (ref 80.0–100.0)
Platelets: 77 10*3/uL — ABNORMAL LOW (ref 150–400)
RBC: 4.13 MIL/uL (ref 3.87–5.11)
RDW: 16.5 % — ABNORMAL HIGH (ref 11.5–15.5)
WBC: 3.2 10*3/uL — ABNORMAL LOW (ref 4.0–10.5)
nRBC: 0 % (ref 0.0–0.2)

## 2022-04-07 LAB — RESP PANEL BY RT-PCR (FLU A&B, COVID) ARPGX2
Influenza A by PCR: NEGATIVE
Influenza B by PCR: NEGATIVE
SARS Coronavirus 2 by RT PCR: NEGATIVE

## 2022-04-07 LAB — RAPID URINE DRUG SCREEN, HOSP PERFORMED
Amphetamines: NOT DETECTED
Barbiturates: NOT DETECTED
Benzodiazepines: NOT DETECTED
Cocaine: NOT DETECTED
Opiates: NOT DETECTED
Tetrahydrocannabinol: NOT DETECTED

## 2022-04-07 LAB — SALICYLATE LEVEL: Salicylate Lvl: <7 mg/dL — ABNORMAL LOW (ref 7.0–30.0)

## 2022-04-07 LAB — ETHANOL: Alcohol, Ethyl (B): 288 mg/dL — ABNORMAL HIGH (ref <10)

## 2022-04-07 MED ORDER — POTASSIUM CHLORIDE CRYS ER 20 MEQ PO TBCR
20.0000 meq | EXTENDED_RELEASE_TABLET | Freq: Two times a day (BID) | ORAL | Status: AC
  Administered 2022-04-07 – 2022-04-08 (×2): 20 meq via ORAL
  Filled 2022-04-07 (×2): qty 1

## 2022-04-07 MED ORDER — ONDANSETRON 4 MG PO TBDP: 4.0000 mg | ORAL_TABLET | Freq: Four times a day (QID) | ORAL | Status: DC | PRN

## 2022-04-07 MED ORDER — LOPERAMIDE HCL 2 MG PO CAPS: 2.0000 mg | ORAL_CAPSULE | ORAL | Status: DC | PRN

## 2022-04-07 MED ORDER — QUETIAPINE FUMARATE 100 MG PO TABS: 100.0000 mg | ORAL_TABLET | Freq: Every day | ORAL | Status: DC

## 2022-04-07 MED ORDER — ALUM & MAG HYDROXIDE-SIMETH 200-200-20 MG/5ML PO SUSP: 30.0000 mL | ORAL | Status: DC | PRN

## 2022-04-07 MED ORDER — HYDROXYZINE HCL 25 MG PO TABS
25.0000 mg | ORAL_TABLET | Freq: Three times a day (TID) | ORAL | Status: DC | PRN
  Administered 2022-04-07 – 2022-04-08 (×2): 25 mg via ORAL
  Filled 2022-04-07 (×2): qty 1

## 2022-04-07 MED ORDER — TETANUS-DIPHTH-ACELL PERTUSSIS 5-2.5-18.5 LF-MCG/0.5 IM SUSY
0.5000 mL | PREFILLED_SYRINGE | Freq: Once | INTRAMUSCULAR | Status: AC
  Administered 2022-04-07: 0.5 mL via INTRAMUSCULAR
  Filled 2022-04-07: qty 0.5

## 2022-04-07 MED ORDER — LIDOCAINE HCL (PF) 1 % IJ SOLN
5.0000 mL | Freq: Once | INTRAMUSCULAR | Status: AC
  Administered 2022-04-07: 5 mL
  Filled 2022-04-07: qty 30

## 2022-04-07 MED ORDER — LORAZEPAM 1 MG PO TABS: 1.0000 mg | ORAL_TABLET | Freq: Four times a day (QID) | ORAL | Status: DC | PRN

## 2022-04-07 MED ORDER — TRAZODONE HCL 50 MG PO TABS
50.0000 mg | ORAL_TABLET | Freq: Every evening | ORAL | Status: DC | PRN
  Administered 2022-04-07: 50 mg via ORAL
  Filled 2022-04-07: qty 1

## 2022-04-07 MED ORDER — ESCITALOPRAM OXALATE 10 MG PO TABS
40.0000 mg | ORAL_TABLET | Freq: Every day | ORAL | Status: DC
Start: 2022-04-07 — End: 2022-04-08
  Administered 2022-04-07 – 2022-04-08 (×2): 40 mg via ORAL
  Filled 2022-04-07 (×2): qty 4

## 2022-04-07 MED ORDER — ACETAMINOPHEN 325 MG PO TABS
650.0000 mg | ORAL_TABLET | Freq: Four times a day (QID) | ORAL | Status: DC | PRN
  Administered 2022-04-08: 650 mg via ORAL
  Filled 2022-04-07: qty 2

## 2022-04-07 MED ORDER — THIAMINE HCL 100 MG/ML IJ SOLN
100.0000 mg | Freq: Once | INTRAMUSCULAR | Status: AC
  Administered 2022-04-07: 100 mg via INTRAMUSCULAR
  Filled 2022-04-07: qty 2

## 2022-04-07 MED ORDER — MAGNESIUM HYDROXIDE 400 MG/5ML PO SUSP: 30.0000 mL | Freq: Every day | ORAL | Status: DC | PRN

## 2022-04-07 MED ORDER — THIAMINE HCL 100 MG PO TABS
100.0000 mg | ORAL_TABLET | Freq: Every day | ORAL | Status: DC
  Administered 2022-04-08: 100 mg via ORAL
  Filled 2022-04-07: qty 1

## 2022-04-07 MED ORDER — GABAPENTIN 100 MG PO CAPS
200.0000 mg | ORAL_CAPSULE | Freq: Three times a day (TID) | ORAL | Status: DC
  Administered 2022-04-07 – 2022-04-08 (×3): 200 mg via ORAL
  Filled 2022-04-07 (×3): qty 2

## 2022-04-07 MED ORDER — TOPIRAMATE 100 MG PO TABS
100.0000 mg | ORAL_TABLET | Freq: Two times a day (BID) | ORAL | Status: DC
  Administered 2022-04-07 – 2022-04-08 (×2): 100 mg via ORAL
  Filled 2022-04-07 (×2): qty 1

## 2022-04-07 MED ORDER — LORAZEPAM 1 MG PO TABS
1.0000 mg | ORAL_TABLET | Freq: Once | ORAL | Status: AC
  Administered 2022-04-07: 1 mg via ORAL
  Filled 2022-04-07: qty 1

## 2022-04-07 MED ORDER — ADULT MULTIVITAMIN W/MINERALS CH
1.0000 | ORAL_TABLET | Freq: Every day | ORAL | Status: DC
  Administered 2022-04-07 – 2022-04-08 (×2): 1 via ORAL
  Filled 2022-04-07 (×2): qty 1

## 2022-04-07 NOTE — ED Provider Notes (Signed)
Care assumed from [place], [place] at shift change, please see their note for full detail, but in brief Sara [name] is a 46 y.o. female presents with IVC.  Plan: Pending CTs followed by TTS consult for dispo recommendations  Physical Exam  BP 93/66   Pulse 66   Temp 97.9 F (36.6 C) (Oral)   Resp 16   Ht '5\' 4"'$  (1.626 m)   Wt 68 kg   SpO2 95%   BMI 25.75 kg/m   Physical Exam  Procedures  Procedures  ED Course / MDM    Medical Decision Making Amount and/or Complexity of Data Reviewed Labs: ordered. Radiology: ordered.  Risk Prescription drug management.   Viewed and interpreted CTs of the head, C-spine maxillofacial which were without any signs of fracture, intracranial bleed or acute traumatic injury. TTS consult placed and pt is recommended to transfer to [facility] for admission.        Tonye Pearson, [place] 04/07/22 1441    Davonna Belling, MD 04/07/22 1530
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
## 2022-04-07 NOTE — ED Notes (Signed)
Patient transported to CT 

## 2022-04-07 NOTE — ED Notes (Signed)
Pt calm and cooperative. No c/ of distress. Will continue to monitor for safety

## 2022-04-07 NOTE — ED Provider Notes (Signed)
[facility] Continuous Assessment Admission H&P  Date: 04/07/22 Patient Name: Sara [name] MRN: [id number] Chief Complaint: No chief complaint on file.     Diagnoses:  Final diagnoses:  Alcohol use with alcohol-induced mood disorder ([place])  MDD (major depressive disorder), recurrent severe, without psychosis ([place])    HPI: Patient presents  to [facility] behavioral health from [facility] emergency department. She is currently under involuntary commitment petition.  Involuntary commitment petition initiated by ,  [place]ris, law enforcement officer.  Petition reads:  "Respondent has charged at her husband tonight with a Electrical engineer.  She cut her wrist today; admitted to me she had done it.  She was taking prescription drugs when we were on scene.  She has an alcohol problem and according to her husband drinks hand sanitizer when she gets desperate enough."  Tanisia endorses alcohol use an average of 3 times per week.  She typically consumes an average of 3 bottles of wine during each episode of alcohol use.  She denies substance use aside from alcohol.  At this time she would prefer not to return to residential substance use treatment.  She reports she recently was discharged from residential substance use treatment at [facility] in [facility], reports this was a traumatic experience.  She does not share details regarding recent traumatic experience at [facility].  Ginny has been diagnosed with alcohol use and alcohol induced mood disorder as well as major depressive disorder.  She is followed by outpatient psychiatry at [facility] behavioral health.  She reports compliance with medications including escitalopram, gabapentin, hydroxyzine and quetiapine.  She endorses history of multiple inpatient psychiatric hospitalizations.  No family mental health history reported.  Leiloni states "I am feeling better, I cut my arm last night because I was seeking attention." She raises to  sleeve to reveal self-inflicted laceration to left wrist, closed with stitches in emergency department.  Per medical record, upon arrival, patient stated "that her intentions when cutting herself was to bleed out." She reports she argued with her husband on last night triggering her to cut her arm.   Patient is assessed face-to-face by nurse practitioner.  She is standing in assessment area, no acute distress.  She is alert and oriented, pleasant and cooperative during assessment.  She presents with depressed mood, tearful affect. She denies suicidal and homicidal ideations.  She denies history of suicide attempts, denies history of non suicidal self-harm behaviors.  She contracts verbally for safety with this Probation officer.  She has normal speech and behavior. She denies auditory and visual hallucinations.  Patient is able to converse coherently with goal-directed thoughts and no distractibility or preoccupation.  She denies paranoia.  Objectively there is no evidence of psychosis/mania or delusional thinking.  Brandin resides in [place] with her husband. She is not currently employed, seeking employment. Patient endorses average sleep and appetite.  Patient offered support and encouragement.     PHQ 2-9:  Flowsheet Row Video Visit from 11/09/2021 in [facility] ED from 10/06/2021 in [place] DEPT Video Visit from 07/08/2021 in [facility]  Thoughts that you would be better off dead, or of hurting yourself in some way Not at all Several days Not at all  PHQ-9 Total Score '16 15 9       '$ Flowsheet Row ED from 04/07/2022 in [place] DEPT Video Visit from 03/01/2022 in [facility] ED from 01/24/2022 in [place]  C-SSRS RISK CATEGORY High Risk No Risk No Risk        Total Time spent with patient: 20 minutes  Musculoskeletal   Strength & Muscle Tone: within normal limits Gait & Station: normal Patient leans: N/A  Psychiatric Specialty Exam  Presentation General Appearance: Appropriate for Environment; Casual  Eye Contact:Good  Speech:Clear and Coherent; Normal Rate  Speech Volume:Normal  Handedness:Right   Mood and Affect  Mood:Depressed  Affect:Depressed; Tearful   Thought Process  Thought Processes:Coherent; Goal Directed; Linear  Descriptions of Associations:Intact  Orientation:Full (Time, Place and Person)  Thought Content:Logical; WDL  Diagnosis of Schizophrenia or Schizoaffective disorder in past: No   Hallucinations:Hallucinations: None  Ideas of Reference:None  Suicidal Thoughts:Suicidal Thoughts: No  Homicidal Thoughts:Homicidal Thoughts: No   Sensorium  Memory:Immediate Good; Recent Fair  Judgment:Intact  Insight:Present   Executive Functions  Concentration:Good  Attention Span:Good  [place] of Knowledge:Good  Language:Good   Psychomotor Activity  Psychomotor Activity:Psychomotor Activity: Normal   Assets  Assets:Communication Skills; Desire for Improvement; Financial Resources/Insurance; Housing; Intimacy; Leisure Time; Physical Health; Resilience; Social Support   Sleep  Sleep:Sleep: Fair   Nutritional Assessment (For OBS and FBC admissions only) Has the patient had a weight loss or gain of 10 pounds or more in the last 3 months?: No Has the patient had a decrease in food intake/or appetite?: No Does the patient have dental problems?: No Does the patient have eating habits or behaviors that may be indicators of an eating disorder including binging or inducing vomiting?: No Has the patient recently lost weight without trying?: 0 Has the patient been eating poorly because of a decreased appetite?: 0 Malnutrition Screening Tool Score: 0    Physical Exam Vitals and nursing note reviewed.  Constitutional:      Appearance: Normal  appearance. She is well-developed.  HENT:     Head: Normocephalic and atraumatic.     Nose: Nose normal.  Cardiovascular:     Rate and Rhythm: Normal rate.  Pulmonary:     Effort: Pulmonary effort is normal.  Musculoskeletal:        General: Normal range of motion.     Cervical back: Normal range of motion.  Skin:    General: Skin is warm and dry.     Findings: Laceration present.     Comments: Self-inflicted laceration to left wrist. Closed with sutures. Clean and dry, open to air. No drainage, no inflammation, no pain reported.   Neurological:     Mental Status: She is alert and oriented to person, place, and time.  Psychiatric:        Attention and Perception: Attention and perception normal.        Mood and Affect: Mood is depressed. Affect is tearful.        Speech: Speech normal.        Behavior: Behavior normal. Behavior is cooperative.        Thought Content: Thought content normal.        Cognition and Memory: Cognition and memory normal.   Review of Systems  Constitutional: Negative.   HENT: Negative.    Eyes: Negative.   Respiratory: Negative.    Cardiovascular: Negative.   Gastrointestinal: Negative.   Genitourinary: Negative.   Musculoskeletal: Negative.   Skin: Negative.   Neurological: Negative.   Endo/Heme/Allergies: Negative.   Psychiatric/Behavioral:  Positive for depression and substance abuse.    There were no vitals taken for this visit. There is no height or  weight on file to calculate BMI.  Past Psychiatric History: Alcohol use with alcohol-induced mood disorder, substance-induced mood disorder, major depressive disorder, recurrent severe without psychosis  Is the patient at risk to self? No  Has the patient been a risk to self in the past 6 months? No .    Has the patient been a risk to self within the distant past? No   Is the patient a risk to others? No   Has the patient been a risk to others in the past 6 months? No   Has the patient been a  risk to others within the distant past? No   Past Medical History:  Past Medical History:  Diagnosis Date   Alcohol abuse    Alcoholism ([place])    Anemia    Anxiety    Blood transfusion without reported diagnosis    Cirrhosis ([place])    Depression    Esophageal varices with bleeding(456.0) 06/13/2014   GERD (gastroesophageal reflux disease)    Heart murmur    Patient states she may have   Menorrhagia    Pancytopenia ([place]) 01/15/2014   Pneumonia    Portal hypertension ([place])    S/P alcohol detoxification    2-3 days at behavioral health previously   UGI bleed 06/12/2014    Past Surgical History:  Procedure Laterality Date   CHOLECYSTECTOMY     ESOPHAGOGASTRODUODENOSCOPY N/A 06/12/2014   Procedure: ESOPHAGOGASTRODUODENOSCOPY (EGD);  Surgeon: Gatha Mayer, MD;  Location: [facility];  Service: Endoscopy;  Laterality: N/A;   ESOPHAGOGASTRODUODENOSCOPY (EGD) WITH PROPOFOL N/A 07/29/2014   Procedure: ESOPHAGOGASTRODUODENOSCOPY (EGD) WITH PROPOFOL;  Surgeon: Inda Castle, MD;  Location: [facility];  Service: Endoscopy;  Laterality: N/A;   ESOPHAGOGASTRODUODENOSCOPY (EGD) WITH PROPOFOL N/A 01/20/2018   Procedure: ESOPHAGOGASTRODUODENOSCOPY (EGD) WITH PROPOFOL;  Surgeon: Mauri Pole, MD;  Location: [facility];  Service: Endoscopy;  Laterality: N/A;    Family History:  Family History  Problem Relation Age of Onset   Colon polyps Mother    Hypertension Mother    Thyroid disease Mother    Alcoholism Mother    Alcoholism Father    Alcohol abuse Maternal Grandfather    Alcohol abuse Paternal Grandfather    Alcohol abuse Paternal Aunt    Alcohol abuse Maternal Uncle     Social History:  Social History   Socioeconomic History   Marital status: Married    Spouse name: [name]   Number of children: 2   Years of education: Associates   Highest education level: Not on file  Occupational History   Occupation: Paralegal  Tobacco Use   Smoking status: Former     Packs/day: 0.25    Types: Cigarettes    Quit date: 11/22/2018    Years since quitting: 3.3   Smokeless tobacco: Never  Substance and Sexual Activity   Alcohol use: Yes    Alcohol/week: 0.0 standard drinks    Comment: Usually drinks 2-3 bottles of wine daily when drinking.    Drug use: No    Types: Cocaine, Marijuana    Comment: denies   Sexual activity: Never    Birth control/protection: None  Other Topics Concern   Not on file  Social History Narrative   Lives with husband and son. Daughter lives nearby. 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   Social Determinants of Health   Financial Resource Strain: Not on file  Food Insecurity: Not on file  Transportation Needs: Not on file  Physical Activity: Not  on file  Stress: Not on file  Social Connections: Not on file  Intimate Partner Violence: Not on file    SDOH:  SDOH Screenings   Alcohol Screen: Not on file  Depression (PHQ2-9): Low Risk    PHQ-2 Score: 1  Financial Resource Strain: Not on file  Food Insecurity: Not on file  Housing: Not on file  Physical Activity: Not on file  Social Connections: Not on file  Stress: Not on file  Tobacco Use: Medium Risk   Smoking Tobacco Use: Former   Smokeless Tobacco Use: Never   Passive Exposure: Not on file  Transportation Needs: Not on file    Last Labs:  Admission on 04/07/2022, Discharged on 04/07/2022  Component Date Value Ref Range Status   Sodium 04/07/2022 144  135 - 145 mmol/L Final   Potassium 04/07/2022 3.2 (L)  3.5 - 5.1 mmol/L Final   Chloride 04/07/2022 112 (H)  98 - 111 mmol/L Final   CO2 04/07/2022 21 (L)  22 - 32 mmol/L Final   Glucose, Bld 04/07/2022 107 (H)  70 - 99 mg/dL Final   Glucose reference range applies only to samples taken after fasting for at least 8 hours.   BUN 04/07/2022 7  6 - 20 mg/dL Final   Creatinine, Ser 04/07/2022 0.56  0.44 - 1.00 mg/dL Final   Calcium 04/07/2022 8.3 (L)  8.9 - 10.3 mg/dL Final   Total  Protein 04/07/2022 7.5  6.5 - 8.1 g/dL Final   Albumin 04/07/2022 3.4 (L)  3.5 - 5.0 g/dL Final   AST 04/07/2022 37  15 - 41 U/L Final   ALT 04/07/2022 21  0 - 44 U/L Final   Alkaline Phosphatase 04/07/2022 74  38 - 126 U/L Final   Total Bilirubin 04/07/2022 0.9  0.3 - 1.2 mg/dL Final   GFR, Estimated 04/07/2022 >60  >60 mL/min Final   Comment: (NOTE) Calculated using the CKD-EPI Creatinine Equation (2021)    Anion gap 04/07/2022 11  5 - 15 Final   Performed at [facility], [place] [address] [place], [place] [address]   Alcohol, Ethyl (B) 04/07/2022 288 (H)  <10 mg/dL Final   Comment: (NOTE) Lowest detectable limit for serum alcohol is 10 mg/dL.  For medical purposes only. Performed at [facility], [place] [address] [place], [place] [address]    Salicylate Lvl 13/24/4010 <7.0 (L)  7.0 - 30.0 mg/dL Final   Performed at [place] [address], [place], [place] [address]   Acetaminophen (Tylenol), Serum 04/07/2022 <10 (L)  10 - 30 ug/mL Final   Comment: (NOTE) Therapeutic concentrations vary significantly. A range of 10-30 ug/mL  may be an effective concentration for many patients. However, some  are best treated at concentrations outside of this range. Acetaminophen concentrations >150 ug/mL at 4 hours after ingestion  and >50 ug/mL at 12 hours after ingestion are often associated with  toxic reactions.  Performed at [facility], [place] [address], [place], [place] [address]    WBC 04/07/2022 3.2 (L)  4.0 - 10.5 K/uL Final   RBC 04/07/2022 4.13  3.87 - 5.11 MIL/uL Final   Hemoglobin 04/07/2022 12.7  12.0 - 15.0 g/dL Final   HCT 04/07/2022 36.7  36.0 - 46.0 % Final   MCV 04/07/2022 88.9  80.0 - 100.0 fL Final   MCH 04/07/2022 30.8  26.0 - 34.0 pg Final   MCHC 04/07/2022 34.6  30.0 - 36.0 g/dL Final  RDW 04/07/2022 16.5 (H)  11.5 - 15.5 % Final   Platelets 04/07/2022 77 (L)  150 - 400 K/uL Final    Comment: SPECIMEN CHECKED FOR CLOTS Immature Platelet Fraction may be clinically indicated, consider ordering this additional test [id number] PLATELET COUNT CONFIRMED BY SMEAR    nRBC 04/07/2022 0.0  0.0 - 0.2 % Final   Performed at [facility], [place] [address], [place], [place] [address]   Opiates 04/07/2022 NONE DETECTED  NONE DETECTED Final   Cocaine 04/07/2022 NONE DETECTED  NONE DETECTED Final   Benzodiazepines 04/07/2022 NONE DETECTED  NONE DETECTED Final   Amphetamines 04/07/2022 NONE DETECTED  NONE DETECTED Final   Tetrahydrocannabinol 04/07/2022 NONE DETECTED  NONE DETECTED Final   Barbiturates 04/07/2022 NONE DETECTED  NONE DETECTED Final   Comment: (NOTE) DRUG SCREEN FOR MEDICAL PURPOSES ONLY.  IF CONFIRMATION IS NEEDED FOR ANY PURPOSE, NOTIFY LAB WITHIN 5 DAYS.  LOWEST DETECTABLE LIMITS FOR URINE DRUG SCREEN Drug Class                     Cutoff (ng/mL) Amphetamine and metabolites    1000 Barbiturate and metabolites    200 Benzodiazepine                 151 Tricyclics and metabolites     300 Opiates and metabolites        300 Cocaine and metabolites        300 THC                            50 Performed at [facility], [place] [address] [place], [place] [address]    I-stat hCG, quantitative 04/07/2022 <5.0  <5 mIU/mL Final   Comment 3 04/07/2022          Final   Comment:   GEST. AGE      CONC.  (mIU/mL)   <=1 WEEK        5 - 50     2 WEEKS       50 - 500     3 WEEKS       100 - 10,000     4 WEEKS     1,000 - 30,000        FEMALE AND NON-PREGNANT FEMALE:     LESS THAN 5 mIU/mL    SARS Coronavirus 2 by RT PCR 04/07/2022 NEGATIVE  NEGATIVE Final   Comment: (NOTE) SARS-CoV-2 target nucleic acids are NOT DETECTED.  The SARS-CoV-2 RNA is generally detectable in upper respiratory specimens during the acute phase of infection. The lowest concentration of SARS-CoV-2 viral copies this assay can detect is 138 copies/mL. A negative  result does not preclude SARS-Cov-2 infection and should not be used as the sole basis for treatment or other patient management decisions. A negative result may occur with  improper specimen collection/handling, submission of specimen other than nasopharyngeal swab, presence of viral mutation(s) within the areas targeted by this assay, and inadequate number of viral copies(<138 copies/mL). A negative result must be combined with clinical observations, patient history, and epidemiological information. The expected result is Negative.  Fact Sheet for Patients:  EntrepreneurPulse.com.au  Fact Sheet for Healthcare Providers:  IncredibleEmployment.be  This test is no                          t yet approved or cleared by the [place]  States FDA and  has been authorized for detection and/or diagnosis of SARS-CoV-2 by FDA under an Emergency Use Authorization (EUA). This EUA will remain  in effect (meaning this test can be used) for the duration of the COVID-19 declaration under Section 564(b)(1) of the Act, 21 U.S.C.section 360bbb-3(b)(1), unless the authorization is terminated  or revoked sooner.       Influenza A by PCR 04/07/2022 NEGATIVE  NEGATIVE Final   Influenza B by PCR 04/07/2022 NEGATIVE  NEGATIVE Final   Comment: (NOTE) The Xpert Xpress SARS-CoV-2/FLU/RSV plus assay is intended as an aid in the diagnosis of influenza from Nasopharyngeal swab specimens and should not be used as a sole basis for treatment. Nasal washings and aspirates are unacceptable for Xpert Xpress SARS-CoV-2/FLU/RSV testing.  Fact Sheet for Patients: EntrepreneurPulse.com.au  Fact Sheet for Healthcare Providers: IncredibleEmployment.be  This test is not yet approved or cleared by the [place] FDA and has been authorized for detection and/or diagnosis of SARS-CoV-2 by FDA under an Emergency Use Authorization (EUA). This EUA will  remain in effect (meaning this test can be used) for the duration of the COVID-19 declaration under Section 564(b)(1) of the Act, 21 U.S.C. section 360bbb-3(b)(1), unless the authorization is terminated or revoked.  Performed at [facility], [place] [address], [place], [place] [address]   Admission on 01/24/2022, Discharged on 01/31/2022  Component Date Value Ref Range Status   SARS Coronavirus 2 by RT PCR 01/24/2022 NEGATIVE  NEGATIVE Final   Comment: (NOTE) SARS-CoV-2 target nucleic acids are NOT DETECTED.  The SARS-CoV-2 RNA is generally detectable in upper respiratory specimens during the acute phase of infection. The lowest concentration of SARS-CoV-2 viral copies this assay can detect is 138 copies/mL. A negative result does not preclude SARS-Cov-2 infection and should not be used as the sole basis for treatment or other patient management decisions. A negative result may occur with  improper specimen collection/handling, submission of specimen other than nasopharyngeal swab, presence of viral mutation(s) within the areas targeted by this assay, and inadequate number of viral copies(<138 copies/mL). A negative result must be combined with clinical observations, patient history, and epidemiological information. The expected result is Negative.  Fact Sheet for Patients:  EntrepreneurPulse.com.au  Fact Sheet for Healthcare Providers:  IncredibleEmployment.be  This test is no                          t yet approved or cleared by the [place] FDA and  has been authorized for detection and/or diagnosis of SARS-CoV-2 by FDA under an Emergency Use Authorization (EUA). This EUA will remain  in effect (meaning this test can be used) for the duration of the COVID-19 declaration under Section 564(b)(1) of the Act, 21 U.S.C.section 360bbb-3(b)(1), unless the authorization is terminated  or revoked sooner.       Influenza A  by PCR 01/24/2022 NEGATIVE  NEGATIVE Final   Influenza B by PCR 01/24/2022 NEGATIVE  NEGATIVE Final   Comment: (NOTE) The Xpert Xpress SARS-CoV-2/FLU/RSV plus assay is intended as an aid in the diagnosis of influenza from Nasopharyngeal swab specimens and should not be used as a sole basis for treatment. Nasal washings and aspirates are unacceptable for Xpert Xpress SARS-CoV-2/FLU/RSV testing.  Fact Sheet for Patients: EntrepreneurPulse.com.au  Fact Sheet for Healthcare Providers: IncredibleEmployment.be  This test is not yet approved or cleared by the [place] FDA and has been authorized for detection and/or diagnosis of SARS-CoV-2 by FDA under  an Emergency Use Authorization (EUA). This EUA will remain in effect (meaning this test can be used) for the duration of the COVID-19 declaration under Section 564(b)(1) of the Act, 21 U.S.C. section 360bbb-3(b)(1), unless the authorization is terminated or revoked.  Performed at [facility], [place] [address], [place], [place] [address]    SARS Coronavirus 2 Ag 01/24/2022 Negative  Negative Preliminary   WBC 01/24/2022 4.0  4.0 - 10.5 K/uL Final   RBC 01/24/2022 3.79 (L)  3.87 - 5.11 MIL/uL Final   Hemoglobin 01/24/2022 11.4 (L)  12.0 - 15.0 g/dL Final   HCT 01/24/2022 34.4 (L)  36.0 - 46.0 % Final   MCV 01/24/2022 90.8  80.0 - 100.0 fL Final   MCH 01/24/2022 30.1  26.0 - 34.0 pg Final   MCHC 01/24/2022 33.1  30.0 - 36.0 g/dL Final   RDW 01/24/2022 16.0 (H)  11.5 - 15.5 % Final   Platelets 01/24/2022 99 (L)  150 - 400 K/uL Final   Comment: Immature Platelet Fraction may be clinically indicated, consider ordering this additional test [id number] CONSISTENT WITH PREVIOUS RESULT REPEATED TO VERIFY    nRBC 01/24/2022 0.0  0.0 - 0.2 % Final   Neutrophils Relative % 01/24/2022 49  % Final   Neutro Abs 01/24/2022 2.0  1.7 - 7.7 K/uL Final   Lymphocytes Relative 01/24/2022 40  % Final   Lymphs  Abs 01/24/2022 1.6  0.7 - 4.0 K/uL Final   Monocytes Relative 01/24/2022 9  % Final   Monocytes Absolute 01/24/2022 0.4  0.1 - 1.0 K/uL Final   Eosinophils Relative 01/24/2022 1  % Final   Eosinophils Absolute 01/24/2022 0.1  0.0 - 0.5 K/uL Final   Basophils Relative 01/24/2022 1  % Final   Basophils Absolute 01/24/2022 0.0  0.0 - 0.1 K/uL Final   Immature Granulocytes 01/24/2022 0  % Final   Abs Immature Granulocytes 01/24/2022 0.01  0.00 - 0.07 K/uL Final   Performed at [facility], [place] [address] [place], [place] [address]   Sodium 01/24/2022 136  135 - 145 mmol/L Final   Potassium 01/24/2022 2.8 (L)  3.5 - 5.1 mmol/L Final   Chloride 01/24/2022 109  98 - 111 mmol/L Final   CO2 01/24/2022 17 (L)  22 - 32 mmol/L Final   Glucose, Bld 01/24/2022 90  70 - 99 mg/dL Final   Glucose reference range applies only to samples taken after fasting for at least 8 hours.   BUN 01/24/2022 6  6 - 20 mg/dL Final   Creatinine, Ser 01/24/2022 0.68  0.44 - 1.00 mg/dL Final   Calcium 01/24/2022 8.2 (L)  8.9 - 10.3 mg/dL Final   Total Protein 01/24/2022 7.0  6.5 - 8.1 g/dL Final   Albumin 01/24/2022 3.3 (L)  3.5 - 5.0 g/dL Final   AST 01/24/2022 36  15 - 41 U/L Final   ALT 01/24/2022 22  0 - 44 U/L Final   Alkaline Phosphatase 01/24/2022 77  38 - 126 U/L Final   Total Bilirubin 01/24/2022 0.9  0.3 - 1.2 mg/dL Final   GFR, Estimated 01/24/2022 >60  >60 mL/min Final   Comment: (NOTE) Calculated using the CKD-EPI Creatinine Equation (2021)    Anion gap 01/24/2022 10  5 - 15 Final   Performed at [facility], [place] [address], [place], [place] [address]   Hgb A1c MFr Bld 01/24/2022 4.7 (L)  4.8 - 5.6 % Final   Comment: (NOTE) Pre diabetes:  5.7%-6.4%  Diabetes:              >6.4%  Glycemic control for   <7.0% adults with diabetes    Mean Plasma Glucose 01/24/2022 88.19  mg/dL Final   Performed at [place] [address] [place], [place] [address]   Alcohol, Ethyl (B)  01/24/2022 201 (H)  <10 mg/dL Final   Comment: (NOTE) Lowest detectable limit for serum alcohol is 10 mg/dL.  For medical purposes only. Performed at [facility], [place] [address] [place], [place] [address]    TSH 01/24/2022 3.012  0.350 - 4.500 uIU/mL Final   Comment: Performed by a 3rd Generation assay with a functional sensitivity of <=0.01 uIU/mL. Performed at [facility], [place] [address], [place], [place] [address]    Color, Urine 01/24/2022 YELLOW  YELLOW Final   APPearance 01/24/2022 CLEAR  CLEAR Final   Specific Gravity, Urine 01/24/2022 1.006  1.005 - 1.030 Final   pH 01/24/2022 7.0  5.0 - 8.0 Final   Glucose, UA 01/24/2022 NEGATIVE  NEGATIVE mg/dL Final   Hgb urine dipstick 01/24/2022 NEGATIVE  NEGATIVE Final   Bilirubin Urine 01/24/2022 NEGATIVE  NEGATIVE Final   Ketones, ur 01/24/2022 NEGATIVE  NEGATIVE mg/dL Final   Protein, ur 01/24/2022 NEGATIVE  NEGATIVE mg/dL Final   Nitrite 01/24/2022 NEGATIVE  NEGATIVE Final   Leukocytes,Ua 01/24/2022 NEGATIVE  NEGATIVE Final   Performed at [facility], [place] [address], [place],  [address]   Preg Test, Ur 01/24/2022 NEGATIVE  NEGATIVE Final   Comment:        THE SENSITIVITY OF THIS METHODOLOGY IS >20 mIU/mL. Performed at [facility], [facility] [address] [place], [place] [address]    POC Amphetamine UR 01/24/2022 None Detected  NONE DETECTED (Cut Off Level 1000 ng/mL) Final   POC Secobarbital (BAR) 01/24/2022 None Detected  NONE DETECTED (Cut Off Level 300 ng/mL) Final   POC Buprenorphine (BUP) 01/24/2022 None Detected  NONE DETECTED (Cut Off Level 10 ng/mL) Final   POC Oxazepam (BZO) 01/24/2022 Positive (A)  NONE DETECTED (Cut Off Level 300 ng/mL) Final   POC Cocaine UR 01/24/2022 None Detected  NONE DETECTED (Cut Off Level 300 ng/mL) Final   POC Methamphetamine UR 01/24/2022 None Detected  NONE DETECTED (Cut Off Level 1000 ng/mL) Final   POC Morphine 01/24/2022 None Detected  NONE DETECTED (Cut Off  Level 300 ng/mL) Final   POC Oxycodone UR 01/24/2022 None Detected  NONE DETECTED (Cut Off Level 100 ng/mL) Final   POC Methadone UR 01/24/2022 None Detected  NONE DETECTED (Cut Off Level 300 ng/mL) Final   POC Marijuana UR 01/24/2022 None Detected  NONE DETECTED (Cut Off Level 50 ng/mL) Final   Preg Test, Ur 01/24/2022 NEGATIVE  NEGATIVE Final   Comment:        THE SENSITIVITY OF THIS METHODOLOGY IS >24 mIU/mL    SARSCOV2ONAVIRUS 2 AG 01/24/2022 NEGATIVE  NEGATIVE Final   Comment: (NOTE) SARS-CoV-2 antigen NOT DETECTED.   Negative results are presumptive.  Negative results do not preclude SARS-CoV-2 infection and should not be used as the sole basis for treatment or other patient management decisions, including infection  control decisions, particularly in the presence of clinical signs and  symptoms consistent with COVID-19, or in those who have been in contact with the virus.  Negative results must be combined with clinical observations, patient history, and epidemiological information. The expected result is Negative.  Fact Sheet for Patients:  HandmadeRecipes.com.cy  Fact Sheet for Healthcare Providers: FuneralLife.at  This test is not yet approved or cleared by the [place] FDA and  has been authorized for detection and/or diagnosis of SARS-CoV-2 by FDA under an Emergency Use Authorization (EUA).  This EUA will remain in effect (meaning this test can be used) for the duration of  the COV                          ID-19 declaration under Section 564(b)(1) of the Act, 21 U.S.C. section 360bbb-3(b)(1), unless the authorization is terminated or revoked sooner.     Cholesterol 01/24/2022 137  0 - 200 mg/dL Final   Triglycerides 01/24/2022 189 (H)  <150 mg/dL Final   HDL 01/24/2022 32 (L)  >40 mg/dL Final   Total CHOL/HDL Ratio 01/24/2022 4.3  RATIO Final   VLDL 01/24/2022 38  0 - 40 mg/dL Final   LDL Cholesterol 01/24/2022 67   0 - 99 mg/dL Final   Comment:        Total Cholesterol/HDL:CHD Risk Coronary Heart Disease Risk Table                     Men   Women  1/2 Average Risk   3.4   3.3  Average Risk       5.0   4.4  2 X Average Risk   9.6   7.1  3 X Average Risk  23.4   11.0        Use the calculated Patient Ratio above and the CHD Risk Table to determine the patient's CHD Risk.        ATP III CLASSIFICATION (LDL):  <100     mg/dL   Optimal  100-129  mg/dL   Near or Above                    Optimal  130-159  mg/dL   Borderline  160-189  mg/dL   High  >190     mg/dL   Very High Performed at [facility] [address], [place], [place] [address]    Sodium 01/25/2022 139  135 - 145 mmol/L Final   Potassium 01/25/2022 4.0  3.5 - 5.1 mmol/L Final   NO VISIBLE HEMOLYSIS   Chloride 01/25/2022 114 (H)  98 - 111 mmol/L Final   CO2 01/25/2022 19 (L)  22 - 32 mmol/L Final   Glucose, Bld 01/25/2022 83  70 - 99 mg/dL Final   Glucose reference range applies only to samples taken after fasting for at least 8 hours.   BUN 01/25/2022 7  6 - 20 mg/dL Final   Creatinine, Ser 01/25/2022 0.53  0.44 - 1.00 mg/dL Final   Calcium 01/25/2022 8.4 (L)  8.9 - 10.3 mg/dL Final   Total Protein 01/25/2022 6.7  6.5 - 8.1 g/dL Final   Albumin 01/25/2022 3.1 (L)  3.5 - 5.0 g/dL Final   AST 01/25/2022 33  15 - 41 U/L Final   ALT 01/25/2022 19  0 - 44 U/L Final   Alkaline Phosphatase 01/25/2022 70  38 - 126 U/L Final   Total Bilirubin 01/25/2022 0.8  0.3 - 1.2 mg/dL Final   GFR, Estimated 01/25/2022 >60  >60 mL/min Final   Comment: (NOTE) Calculated using the CKD-EPI Creatinine Equation (2021)    Anion gap 01/25/2022 6  5 - 15 Final   Performed at [facility]  [address] [place], [place] [address]  Admission on 10/07/2021, Discharged on 10/11/2021  Component Date Value Ref Range Status   POC Amphetamine UR 10/07/2021 None Detected  NONE DETECTED (Cut Off Level 1000 ng/mL) Final   POC Secobarbital (BAR)  10/07/2021 None Detected  NONE DETECTED (Cut Off Level 300 ng/mL) Final   POC Buprenorphine (BUP) 10/07/2021 None Detected  NONE DETECTED (Cut Off Level 10 ng/mL) Final   POC Oxazepam (BZO) 10/07/2021 Positive (A)  NONE DETECTED (Cut Off Level 300 ng/mL) Final   POC Cocaine UR 10/07/2021 None Detected  NONE DETECTED (Cut Off Level 300 ng/mL) Final   POC Methamphetamine UR 10/07/2021 None Detected  NONE DETECTED (Cut Off Level 1000 ng/mL) Final   POC Morphine 10/07/2021 None Detected  NONE DETECTED (Cut Off Level 300 ng/mL) Final   POC Oxycodone UR 10/07/2021 None Detected  NONE DETECTED (Cut Off Level 100 ng/mL) Final   POC Methadone UR 10/07/2021 None Detected  NONE DETECTED (Cut Off Level 300 ng/mL) Final   POC Marijuana UR 10/07/2021 None Detected  NONE DETECTED (Cut Off Level 50 ng/mL) Final    Allergies: Morphine and related and Nsaids  PTA Medications: (Not in a hospital admission)   Medical Decision Making  Patient reviewed with Dr Serafina Mitchell.  Patient will be placed in observation area at [facility] behavioral health while awaiting inpatient psychiatric admission.  She remains under involuntary commitment.  Laboratory studies from 04/07/2022, potassium measures 3.2. Potassium replacement order initiated: -Potassium chloride 20 M EQ oral twice daily x2 doses.  EKG completed on 04/07/2022, QT/QTcB prolonged at 502/518.  Home medication, Quetiapine, will be held at this time.  Restarted home medications including: -Escitalopram 40 mg daily -Gabapentin 200 mg 3 times daily -Topiramate 100 mg twice daily  Current medications: -Acetaminophen 650 mg every 6 as needed/mild pain -Maalox 30 mL oral every 4 as needed/digestion -Hydroxyzine 25 mg 3 times daily as needed/anxiety -Magnesium hydroxide 30 mL daily as needed/mild constipation -Trazodone 50 mg nightly as needed/sleep  CIWA Ativan protocol initiated: -Loperamide 2 to 4 mg oral as needed/diarrhea or loose  stools -Lorazepam 1 mg every 6 hours as needed CIWA greater than 10 -Multivitamin with minerals 1 tablet daily -Ondansetron disintegrating tablet 4 mg every 6 as needed/nausea or vomiting -Thiamine injection 100 mg IM once -Thiamine tablet 100 mg daily   Recommendations  Based on my evaluation the patient does not appear to have an emergency medical condition.  Lucky Rathke, FNP 04/07/22  5:00 PM
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
## 2022-04-07 NOTE — ED Notes (Addendum)
[employer] called to transport to behavioral health Urgent care observation unit.
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
## 2022-04-08 ENCOUNTER — Other Ambulatory Visit: Payer: Self-pay

## 2022-04-08 ENCOUNTER — Encounter (HOSPITAL_COMMUNITY): Payer: Self-pay | Admitting: Family

## 2022-04-08 ENCOUNTER — Inpatient Hospital Stay (HOSPITAL_COMMUNITY)
Admission: AD | Admit: 2022-04-08 | Discharge: 2022-04-14 | DRG: 885 | Disposition: A | Discharge status: Home / Self Care | Payer: No Typology Code available for payment source | Attending: Psychiatry | Admitting: Psychiatry

## 2022-04-08 DIAGNOSIS — Z8371 Family history of colonic polyps: Secondary | ICD-10-CM | POA: Diagnosis not present

## 2022-04-08 DIAGNOSIS — D61818 Other pancytopenia: Secondary | ICD-10-CM | POA: Diagnosis present

## 2022-04-08 DIAGNOSIS — Z8249 Family history of ischemic heart disease and other diseases of the circulatory system: Secondary | ICD-10-CM

## 2022-04-08 DIAGNOSIS — K219 Gastro-esophageal reflux disease without esophagitis: Secondary | ICD-10-CM | POA: Diagnosis present

## 2022-04-08 DIAGNOSIS — D696 Thrombocytopenia, unspecified: Secondary | ICD-10-CM | POA: Diagnosis present

## 2022-04-08 DIAGNOSIS — E876 Hypokalemia: Secondary | ICD-10-CM | POA: Diagnosis present

## 2022-04-08 DIAGNOSIS — Z6372 Alcoholism and drug addiction in family: Secondary | ICD-10-CM

## 2022-04-08 DIAGNOSIS — X789XXD Intentional self-harm by unspecified sharp object, subsequent encounter: Secondary | ICD-10-CM | POA: Diagnosis present

## 2022-04-08 DIAGNOSIS — F1020 Alcohol dependence, uncomplicated: Secondary | ICD-10-CM | POA: Diagnosis present

## 2022-04-08 DIAGNOSIS — F10239 Alcohol dependence with withdrawal, unspecified: Secondary | ICD-10-CM | POA: Diagnosis present

## 2022-04-08 DIAGNOSIS — S61512D Laceration without foreign body of left wrist, subsequent encounter: Secondary | ICD-10-CM

## 2022-04-08 DIAGNOSIS — F332 Major depressive disorder, recurrent severe without psychotic features: Secondary | ICD-10-CM | POA: Diagnosis present

## 2022-04-08 DIAGNOSIS — Z886 Allergy status to analgesic agent status: Secondary | ICD-10-CM

## 2022-04-08 DIAGNOSIS — K0889 Other specified disorders of teeth and supporting structures: Secondary | ICD-10-CM | POA: Diagnosis not present

## 2022-04-08 DIAGNOSIS — Z885 Allergy status to narcotic agent status: Secondary | ICD-10-CM

## 2022-04-08 DIAGNOSIS — Z811 Family history of alcohol abuse and dependence: Secondary | ICD-10-CM | POA: Diagnosis not present

## 2022-04-08 DIAGNOSIS — F419 Anxiety disorder, unspecified: Secondary | ICD-10-CM | POA: Diagnosis present

## 2022-04-08 DIAGNOSIS — Z8349 Family history of other endocrine, nutritional and metabolic diseases: Secondary | ICD-10-CM

## 2022-04-08 DIAGNOSIS — F1024 Alcohol dependence with alcohol-induced mood disorder: Secondary | ICD-10-CM | POA: Diagnosis present

## 2022-04-08 DIAGNOSIS — Z79899 Other long term (current) drug therapy: Secondary | ICD-10-CM | POA: Diagnosis not present

## 2022-04-08 DIAGNOSIS — F1994 Other psychoactive substance use, unspecified with psychoactive substance-induced mood disorder: Secondary | ICD-10-CM | POA: Diagnosis present

## 2022-04-08 DIAGNOSIS — K766 Portal hypertension: Secondary | ICD-10-CM | POA: Diagnosis present

## 2022-04-08 DIAGNOSIS — G4700 Insomnia, unspecified: Secondary | ICD-10-CM | POA: Diagnosis present

## 2022-04-08 DIAGNOSIS — F32A Depression, unspecified: Secondary | ICD-10-CM | POA: Diagnosis present

## 2022-04-08 DIAGNOSIS — F411 Generalized anxiety disorder: Secondary | ICD-10-CM | POA: Diagnosis present

## 2022-04-08 DIAGNOSIS — F319 Bipolar disorder, unspecified: Secondary | ICD-10-CM | POA: Diagnosis present

## 2022-04-08 DIAGNOSIS — F1094 Alcohol use, unspecified with alcohol-induced mood disorder: Secondary | ICD-10-CM | POA: Diagnosis present

## 2022-04-08 DIAGNOSIS — F10939 Alcohol use, unspecified with withdrawal, unspecified: Secondary | ICD-10-CM | POA: Diagnosis present

## 2022-04-08 DIAGNOSIS — F4310 Post-traumatic stress disorder, unspecified: Secondary | ICD-10-CM | POA: Diagnosis present

## 2022-04-08 DIAGNOSIS — K7030 Alcoholic cirrhosis of liver without ascites: Secondary | ICD-10-CM | POA: Diagnosis present

## 2022-04-08 MED ORDER — ACETAMINOPHEN 325 MG PO TABS
650.0000 mg | ORAL_TABLET | Freq: Four times a day (QID) | ORAL | Status: DC | PRN
  Administered 2022-04-08 – 2022-04-10 (×6): 650 mg via ORAL
  Filled 2022-04-08 (×6): qty 2

## 2022-04-08 MED ORDER — LOPERAMIDE HCL 2 MG PO CAPS: 2.0000 mg | ORAL_CAPSULE | ORAL | Status: DC | PRN

## 2022-04-08 MED ORDER — MAGNESIUM HYDROXIDE 400 MG/5ML PO SUSP
30.0000 mL | Freq: Every day | ORAL | Status: DC | PRN
  Administered 2022-04-11: 30 mL via ORAL
  Filled 2022-04-08: qty 30

## 2022-04-08 MED ORDER — LORAZEPAM 1 MG PO TABS
1.0000 mg | ORAL_TABLET | Freq: Four times a day (QID) | ORAL | Status: DC | PRN
  Administered 2022-04-10 (×2): 1 mg via ORAL
  Filled 2022-04-08 (×2): qty 1

## 2022-04-08 MED ORDER — HYDROXYZINE HCL 25 MG PO TABS
25.0000 mg | ORAL_TABLET | Freq: Three times a day (TID) | ORAL | Status: DC | PRN
  Administered 2022-04-08 – 2022-04-09 (×3): 25 mg via ORAL
  Filled 2022-04-08 (×3): qty 1

## 2022-04-08 MED ORDER — ADULT MULTIVITAMIN W/MINERALS CH
1.0000 | ORAL_TABLET | Freq: Every day | ORAL | Status: DC
  Administered 2022-04-09 – 2022-04-14 (×6): 1 via ORAL
  Filled 2022-04-08 (×7): qty 1

## 2022-04-08 MED ORDER — THIAMINE HCL 100 MG PO TABS
100.0000 mg | ORAL_TABLET | Freq: Every day | ORAL | Status: DC
  Administered 2022-04-09 – 2022-04-14 (×6): 100 mg via ORAL
  Filled 2022-04-08 (×7): qty 1

## 2022-04-08 MED ORDER — GABAPENTIN 100 MG PO CAPS
200.0000 mg | ORAL_CAPSULE | Freq: Three times a day (TID) | ORAL | Status: DC
  Administered 2022-04-08 – 2022-04-14 (×18): 200 mg via ORAL
  Filled 2022-04-08 (×21): qty 2

## 2022-04-08 MED ORDER — ESCITALOPRAM OXALATE 20 MG PO TABS
40.0000 mg | ORAL_TABLET | Freq: Every day | ORAL | Status: DC
  Administered 2022-04-09 – 2022-04-10 (×2): 40 mg via ORAL
  Filled 2022-04-08 (×3): qty 2

## 2022-04-08 MED ORDER — ONDANSETRON 4 MG PO TBDP
4.0000 mg | ORAL_TABLET | Freq: Four times a day (QID) | ORAL | Status: DC | PRN
  Administered 2022-04-10: 4 mg via ORAL
  Filled 2022-04-08: qty 1

## 2022-04-08 MED ORDER — TOPIRAMATE 100 MG PO TABS
100.0000 mg | ORAL_TABLET | Freq: Two times a day (BID) | ORAL | Status: DC
  Administered 2022-04-08 – 2022-04-14 (×12): 100 mg via ORAL
  Filled 2022-04-08 (×14): qty 1

## 2022-04-08 MED ORDER — TRAZODONE HCL 100 MG PO TABS
100.0000 mg | ORAL_TABLET | Freq: Every day | ORAL | Status: DC
  Administered 2022-04-08 – 2022-04-09 (×2): 100 mg via ORAL
  Filled 2022-04-08 (×4): qty 1

## 2022-04-08 MED ORDER — ALUM & MAG HYDROXIDE-SIMETH 200-200-20 MG/5ML PO SUSP: 30.0000 mL | ORAL | Status: DC | PRN

## 2022-04-08 NOTE — ED Notes (Signed)
Report provided to [employer] at [facility].
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
## 2022-04-08 NOTE — ED Notes (Signed)
Discharge instructions provided and Pt stated understanding. Pt alert, orient and ambulatory prior to d/c from facility. Personal belongings returned from locker number 5. [employer] called for transportation services to [facility]. Pt escorted to the sally port. Safety maintained.

## 2022-04-08 NOTE — Progress Notes (Signed)
[facility] LCSW Progress Note   04/08/2022    11:03 AM  [name]   [id number]   Type of Contact and Topic:  Psychiatric Bed Placement   Pt accepted to [facility] 305-1     Patient meets inpatient criteria per Beatriz Stallion, NP  The attending provider will be Janine Limbo, MD   Call report to [phone]    Leota Jacobsen, LPN @ [facility] notified.     Pt scheduled  to arrive at [place] 1300.   Mariea Clonts, MSW, LCSW-A  11:04 AM 04/08/2022

## 2022-04-08 NOTE — ED Notes (Signed)
Patient has been given breakfast 

## 2022-04-08 NOTE — ED Notes (Signed)
[employer] called for transportation to [facility].

## 2022-04-08 NOTE — Group Note (Signed)
[place] LCSW Group Therapy   04/08/2022 2:02 PM    Type of Therapy and Topic:  Group Therapy:  Strengths Exploration   Participation Level: Did Not Attend  Description of Group: This group allows individuals to explore their strengths, learn to use strengths in new ways to improve well-being. Strengths-based interventions involve identifying strengths, understanding how they are used, and learning new ways to apply them. Individuals will identify their strengths, and then explore their roles in different areas of life (relationships, professional life, and personal fulfillment). Individuals will think about ways in which they currently use their strengths, along with new ways they could begin using them.    Therapeutic Goals Patient will verbalize two of their strengths Patient will identify how their strengths are currently used Patient will identify two new ways to apply their strengths  Patients will create a plan to apply their strengths in their daily lives     Summary of Patient Progress:  Did not attend       Therapeutic Modalities Cognitive Behavioral Therapy Motivational Interviewing   Keria Widrig, LCSW, [facility]

## 2022-04-08 NOTE — ED Notes (Signed)
[facility] paperwork faxed to [phone]. Three copies placed in envelope inside folder at the nurses desk. Awaiting the confirmation at this time.
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
## 2022-04-08 NOTE — Plan of Care (Signed)
  Problem: Activity: Goal: Interest or engagement in activities will improve Outcome: Progressing Goal: Sleeping patterns will improve Outcome: Progressing   Problem: Education: Goal: Knowledge of [facility] Education information/materials will improve Outcome: Progressing Goal: Emotional status will improve Outcome: Progressing Goal: Mental status will improve Outcome: Progressing Goal: Verbalization of understanding the information provided will improve Outcome: Progressing   

## 2022-04-09 MED ORDER — BENZOCAINE 10 % MT GEL
Freq: Three times a day (TID) | OROMUCOSAL | Status: DC | PRN
  Administered 2022-04-10: 1 via OROMUCOSAL
  Filled 2022-04-09: qty 9

## 2022-04-09 MED ORDER — WHITE PETROLATUM EX OINT
TOPICAL_OINTMENT | CUTANEOUS | Status: AC
Start: 2022-04-09 — End: 2022-04-09
  Filled 2022-04-09: qty 5

## 2022-04-09 MED ORDER — HYDROXYZINE HCL 50 MG PO TABS
50.0000 mg | ORAL_TABLET | Freq: Three times a day (TID) | ORAL | Status: DC | PRN
  Administered 2022-04-09 – 2022-04-14 (×10): 50 mg via ORAL
  Filled 2022-04-09 (×5): qty 1
  Filled 2022-04-09: qty 10
  Filled 2022-04-09 (×5): qty 1

## 2022-04-09 NOTE — Progress Notes (Signed)
   04/08/22 2200  Psych Admission Type (Psych Patients Only)  Admission Status Involuntary  Psychosocial Assessment  Patient Complaints Anxiety  Eye Contact Fair  Facial Expression Flat;Sad;Worried  Affect Depressed  Speech Logical/coherent  Interaction Assertive  Motor Activity Slow  Appearance/Hygiene Unremarkable  Behavior Characteristics Appropriate to situation;Cooperative  Thought Process  Coherency WDL  Content WDL  Delusions None reported or observed  Perception WDL  Hallucination None reported or observed  Judgment Poor  Confusion None  Danger to Self  Current suicidal ideation? Denies  Self-Injurious Behavior No self-injurious ideation or behavior indicators observed or expressed   Agreement Not to Harm Self Yes  Description of Agreement verbal  Danger to Others  Danger to Others None reported or observed
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
## 2022-04-09 NOTE — [facility] Group Notes (Signed)
[facility] Group Notes:  (Nursing/MHT/Case Management/Adjunct)  Date:  04/09/2022  Time:  8:54 PM  Type of Therapy:   wrap up  Participation Level:  Active  Participation Quality:  Appropriate and Attentive  Affect:  Appropriate  Cognitive:  Appropriate  Insight:  Appropriate, Good, and Improving  Engagement in Group:  Developing/Improving  Modes of Intervention:  Discussion  Summary of Progress/Problems: PT says she is happy because she got a roommate. Her goal was to stay stable.  Maxine Glenn 04/09/2022, 8:54 PM

## 2022-04-09 NOTE — Group Note (Signed)
LCSW Group Therapy Note 04/09/2022  11:15am-12:00pm  Type of Therapy and Topic:  Group Therapy: Anger and Commonalities  Participation Level:  Did Not Attend   Description of Group: In this group, patients initially shared an "unknown" fact about themselves and CSW led a discussion about the ways in which we have things in common without realizing it.  Patient then identified a recent time they became angry and how this yet again showed a way in which they had something in common with other patients.  We discussed possible unhealthy reactions to anger and possible healthy reactions.  We also discussed possible underlying emotions that lead to the anger.  Commonalities among group members were pointed out throughout the entirety of group.  Therapeutic Goals: Patients were asked to share something about themselves and learned that they often have things in common with other people without knowing this Patients will remember an incident of anger and how they reacted Patients will be able to identify their reaction as healthy or unhealthy, and identify possible reactions that would have been the opposite Patients will learn that anger itself is a secondary emotion and will think about their primary emotion at the time of their last incident of anger  Summary of Patient Progress:  The patient was invited to group, did not attend.  Therapeutic Modalities:   Cognitive Behavioral Therapy  Maretta Los, LCSW 04/09/2022  11:26 AM

## 2022-04-09 NOTE — Progress Notes (Signed)
D. Pt presented with a flat affect - complained of right cheek/teeth pain this morning, 8/10, with minimal relief after Tylenol was given. Pt reported that she slept ''fair', described her appetite and concentration as 'good', and energy level as 'normal'. Per pt's self inventory, pt rated her depression,hopelessness and anxiety a 2/1/4, respectively. Pt currently denies withdrawal symptoms, SI/HI and AVH  A. Labs and vitals monitored. Pt given and educated on medications. Pt supported emotionally and encouraged to express concerns and ask questions.   R. Pt remains safe with 15 minute checks. Will continue POC.

## 2022-04-09 NOTE — [facility] Group Notes (Signed)
.  Psychoeducational Group Note    Date:  5/20//23 Time: 1300-1400    Purpose of Group: . The group focus' on teaching patients on how to identify their needs and their Life Skills:  A group where two lists are made. What people need and what are things that we do that are unhealthy. The lists are developed by the patients and it is explained that we often do the actions that are not healthy to get our list of needs met.  Goal:: to develop the coping skills needed to get their needs met  Participation Level:  Active  Participation Quality:  Appropriate  Affect:  Appropriate  Cognitive:  Oriented  Insight:  Improving  Engagement in Group:  Engaged  Additional Comments: Rates energy at a 6/10. Participated fully in the group.  Paulino Rily

## 2022-04-09 NOTE — [facility] Group Notes (Signed)
Psychoeducational Group Note  Date: 04/09/2022 Time: 0900-1000    Goal Setting   Purpose of Group: This group helps to provide patients with the steps of setting a goal that is specific, measurable, attainable, realistic and time specific. A discussion on how we keep ourselves stuck with negative self talk. Homework given for Patients to write 30 positive attributes about themselves.    Participation Level:  did not attend  Paulino Rily
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
## 2022-04-09 NOTE — Progress Notes (Signed)
Patient did not attend morning orientation/goals group because she was asleep.

## 2022-04-10 DIAGNOSIS — F332 Major depressive disorder, recurrent severe without psychotic features: Secondary | ICD-10-CM

## 2022-04-10 LAB — BASIC METABOLIC PANEL
Anion gap: 6 (ref 5–15)
BUN: 10 mg/dL (ref 6–20)
CO2: 20 mmol/L — ABNORMAL LOW (ref 22–32)
Calcium: 8.6 mg/dL — ABNORMAL LOW (ref 8.9–10.3)
Chloride: 114 mmol/L — ABNORMAL HIGH (ref 98–111)
Creatinine, Ser: 0.59 mg/dL (ref 0.44–1.00)
GFR, Estimated: >60 mL/min (ref >60)
Glucose, Bld: 116 mg/dL — ABNORMAL HIGH (ref 70–99)
Potassium: 3.5 mmol/L (ref 3.5–5.1)
Sodium: 140 mmol/L (ref 135–145)

## 2022-04-10 LAB — AMMONIA: Ammonia: 75 umol/L — ABNORMAL HIGH (ref 9–35)

## 2022-04-10 LAB — LIPID PANEL
Cholesterol: 121 mg/dL (ref 0–200)
HDL: 37 mg/dL — ABNORMAL LOW (ref >40)
LDL Cholesterol: 65 mg/dL (ref 0–99)
Total CHOL/HDL Ratio: 3.3 RATIO
Triglycerides: 95 mg/dL (ref <150)
VLDL: 19 mg/dL (ref 0–40)

## 2022-04-10 LAB — VITAMIN B12: Vitamin B-12: 376 pg/mL (ref 180–914)

## 2022-04-10 LAB — MAGNESIUM: Magnesium: 2 mg/dL (ref 1.7–2.4)

## 2022-04-10 MED ORDER — ESCITALOPRAM OXALATE 20 MG PO TABS
20.0000 mg | ORAL_TABLET | Freq: Every day | ORAL | Status: DC
  Administered 2022-04-11 – 2022-04-14 (×4): 20 mg via ORAL
  Filled 2022-04-10 (×5): qty 1

## 2022-04-10 MED ORDER — QUETIAPINE FUMARATE 50 MG PO TABS
50.0000 mg | ORAL_TABLET | Freq: Every day | ORAL | Status: DC
  Administered 2022-04-10: 50 mg via ORAL
  Filled 2022-04-10 (×3): qty 1

## 2022-04-10 MED ORDER — CHLORDIAZEPOXIDE HCL 5 MG PO CAPS
5.0000 mg | ORAL_CAPSULE | Freq: Three times a day (TID) | ORAL | Status: AC
  Administered 2022-04-10 – 2022-04-11 (×4): 5 mg via ORAL
  Filled 2022-04-10 (×4): qty 1

## 2022-04-10 NOTE — Progress Notes (Signed)
Psychoeducational Group Note  Date:  04/10/2022 Time:  2015  Group Topic/Focus:  Wrap up group  Participation Level: Did Not Attend  Participation Quality:  Not Applicable  Affect:  Not Applicable  Cognitive:  Not Applicable  Insight:  Not Applicable  Engagement in Group: Not Applicable  Additional Comments:  Did not attend.   [name] 04/10/2022, 9:18 PM

## 2022-04-10 NOTE — Progress Notes (Signed)
   04/10/22 2158  Psych Admission Type (Psych Patients Only)  Admission Status Involuntary  Psychosocial Assessment  Patient Complaints Anxiety  Eye Contact Fair  Facial Expression Flat  Affect Appropriate to circumstance  Speech Logical/coherent  Interaction Minimal  Motor Activity Other (Comment) (WDL)  Appearance/Hygiene Unremarkable  Behavior Characteristics Appropriate to situation  Mood Anxious;Pleasant  Thought Process  Coherency WDL  Content WDL  Delusions None reported or observed  Perception WDL  Hallucination None reported or observed  Judgment Poor  Confusion None  Danger to Self  Current suicidal ideation? Denies  Self-Injurious Behavior No self-injurious ideation or behavior indicators observed or expressed   Agreement Not to Harm Self Yes  Description of Agreement verbal  Danger to Others  Danger to Others None reported or observed

## 2022-04-10 NOTE — Progress Notes (Signed)
D. Pt presented friendly, but tired this am, reporting poor sleep last night despite having had sleep medication.. Pt endorsed good concentration, normal energy level and good appetite. Per pt's self inventory, pt rated her depression, hopelessness and anxiety a 2/0/4, respectively. Pt continues to complain of a mouth sore, and reported that her goal today was 'pain management.' Pt currently denies SI/HI and AVH   A. Labs and vitals monitored. Pt given scheduled meds and prn meds for mouth pain. Pt supported emotionally and encouraged to express concerns and ask questions.   R. Pt remains safe with 15 minute checks. Will continue POC.

## 2022-04-10 NOTE — [facility] Group Notes (Signed)
Adult Psychoeducational Group Not Date:  04/10/2022 Time:  7414-2395 Group Topic/Focus: PROGRESSIVE RELAXATION. A group where deep breathing is taught and tensing and relaxation muscle groups is used. Imagery is used as well.  Pts are asked to imagine 3 pillars that hold them up when they are not able to hold themselves up and to share that with the group.  Participation Level:  Active  Participation Quality:  Appropriate  Affect:  Appropriate  Cognitive:  Oriented  Insight: Improving  Engagement in Group:  Engaged  Modes of Intervention:  Activity, Discussion, Education, and Support  Additional Comments:  Pt rates her energy at a 5/10. States friends  and her sisters hold her up.  Paulino Rily

## 2022-04-10 NOTE — Progress Notes (Signed)
   04/09/22 2200  Psych Admission Type (Psych Patients Only)  Admission Status Involuntary  Psychosocial Assessment  Patient Complaints Anxiety  Eye Contact Fair  Facial Expression Flat  Affect Depressed  Speech Logical/coherent  Interaction Assertive  Motor Activity Slow  Appearance/Hygiene Improved  Behavior Characteristics Appropriate to situation;Cooperative  Mood Pleasant  Thought Process  Coherency WDL  Content WDL  Delusions None reported or observed  Perception WDL  Hallucination None reported or observed  Judgment Poor  Confusion None  Danger to Self  Current suicidal ideation? Denies  Self-Injurious Behavior No self-injurious ideation or behavior indicators observed or expressed   Agreement Not to Harm Self Yes  Description of Agreement verbal  Danger to Others  Danger to Others None reported or observed

## 2022-04-10 NOTE — [facility] Group Notes (Signed)
.  Psychoeducational Group Note    Date:  5/21//23 Time: 1300-1400    Purpose of Group: . The group focus' on teaching patients on how to identify their needs and their Life Skills:  A group where two lists are made. What people need and what are things that we do that are unhealthy. The lists are developed by the patients and it is explained that we often do the actions that are not healthy to get our list of needs met.  Goal:: to develop the coping skills needed to get their needs met  Participation Level: did not attend  Clerence Gubser A  
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
## 2022-04-11 ENCOUNTER — Encounter (HOSPITAL_COMMUNITY): Payer: Self-pay

## 2022-04-11 LAB — HEPATITIS PANEL, ACUTE
HCV Ab: NONREACTIVE
Hep A IgM: NONREACTIVE
Hep B C IgM: NONREACTIVE
Hepatitis B Surface Ag: NONREACTIVE

## 2022-04-11 LAB — VITAMIN D 25 HYDROXY (VIT D DEFICIENCY, FRACTURES): Vit D, 25-Hydroxy: 32.72 ng/mL (ref 30–100)

## 2022-04-11 LAB — HEMOGLOBIN A1C
Hgb A1c MFr Bld: 4.9 % (ref 4.8–5.6)
Mean Plasma Glucose: 93.93 mg/dL

## 2022-04-11 MED ORDER — LACTULOSE 10 GM/15ML PO SOLN
30.0000 g | Freq: Two times a day (BID) | ORAL | Status: DC
  Administered 2022-04-11 – 2022-04-14 (×6): 30 g via ORAL
  Filled 2022-04-11 (×12): qty 45

## 2022-04-11 MED ORDER — BENZOCAINE 10 % MT GEL: Freq: Two times a day (BID) | OROMUCOSAL | Status: DC | PRN

## 2022-04-11 MED ORDER — QUETIAPINE FUMARATE ER 50 MG PO TB24
50.0000 mg | ORAL_TABLET | Freq: Every day | ORAL | Status: DC
  Administered 2022-04-11: 50 mg via ORAL
  Filled 2022-04-11 (×4): qty 1

## 2022-04-11 NOTE — [facility] Group Notes (Signed)
Pt attended AA group 

## 2022-04-11 NOTE — Group Note (Signed)
[facility] LCSW Group Therapy Note   Group Date: 04/11/2022 Start Time: 1300 End Time: 1400   Type of Therapy/Topic:  Group Therapy:  Emotion Regulation  Participation Level:  Minimal   Mood: Euthymic   Description of Group:    The purpose of this group is to assist patients in learning to regulate negative emotions and experience positive emotions. Patients will be guided to discuss ways in which they have been vulnerable to their negative emotions. These vulnerabilities will be juxtaposed with experiences of positive emotions or situations, and patients challenged to use positive emotions to combat negative ones. Special emphasis will be placed on coping with negative emotions in conflict situations, and patients will process healthy conflict resolution skills.  Therapeutic Goals: Patient will identify two positive emotions or experiences to reflect on in order to balance out negative emotions:  Patient will label two or more emotions that they find the most difficult to experience:  Patient will be able to demonstrate positive conflict resolution skills through discussion or role plays:   Summary of Patient Progress:   Patient was present for the entirety of group session. Patient participated in opening and closing remarks. However, patient did not contribute at all to the topic of discussion despite encouraged participation.     Therapeutic Modalities:   Cognitive Behavioral Therapy Feelings Identification Dialectical Behavioral Therapy   Durenda Hurt, [place]

## 2022-04-11 NOTE — Group Note (Signed)
Recreation Therapy Group Note   Group Topic:Stress Management  Group Date: 04/11/2022 Start Time: 0940 End Time: 0953 Facilitators: [name], LRT,CTRS Location: [address] Dayroom   Goal Area(s) Addresses:  Patient will actively participate in stress management techniques presented during session.  Patient will successfully identify benefit of practicing stress management post d/c.   Group Description: Guided Imagery. LRT provided education, instruction, and demonstration on practice of visualization via guided imagery. Patient was asked to participate in the technique introduced during session. LRT debriefed including topics of mindfulness, stress management and specific scenarios each patient could use these techniques. Patients were given suggestions of ways to access scripts post d/c and encouraged to explore Youtube and other apps available on smartphones, tablets, and computers.   Affect/Mood: N/A   Participation Level: Did not attend    Clinical Observations/Individualized Feedback:     Plan: Continue to engage patient in RT group sessions 2-3x/week.   [name], LRT,CTRS 04/11/2022 11:19 AM

## 2022-04-11 NOTE — BH IP Treatment Plan (Signed)
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[place])   Portal hypertension ([place])   Substance induced mood disorder ([place])   Alcohol dependence syndrome ([place])   Thrombocytopenia ([place])   Alcohol dependence with alcohol-induced mood disorder ([place])   Pancytopenia ([place])   Alcohol use disorder, severe, dependence ([place])   Insomnia   Hypokalemia   Bipolar 1 disorder ([place])   Alcohol withdrawal ([place])   Alcohol use with alcohol-induced mood disorder ([place])   Anxiety state   Current Medications:  Current Facility-Administered Medications  Medication Dose Route Frequency Provider Last Rate Last Admin   acetaminophen (TYLENOL) tablet 650 mg  650 mg Oral Q6H PRN Lucky Rathke, FNP   650 mg at 04/10/22 2109   alum & mag hydroxide-simeth (MAALOX/MYLANTA) 200-200-20 MG/5ML suspension 30 mL  30 mL Oral Q4H PRN Lucky Rathke, FNP       benzocaine (ORAJEL) 10 % mucosal gel   Mouth/Throat TID PRN Laretta Bolster, FNP   Given at 04/10/22 2108   escitalopram (LEXAPRO) tablet 20 mg  20 mg Oral Daily Hill, Jackie Plum, MD   20 mg at 04/11/22 0732   gabapentin (NEURONTIN) capsule 200 mg  200 mg Oral TID Lucky Rathke, FNP   200 mg at 04/11/22 4174   hydrOXYzine (ATARAX) tablet 50 mg  50 mg Oral TID PRN Laretta Bolster, FNP   50 mg at 04/10/22 2109   loperamide (IMODIUM) capsule 2-4 mg  2-4 mg Oral PRN Lucky Rathke, FNP       LORazepam (ATIVAN) tablet 1 mg  1 mg Oral Q6H PRN Lucky Rathke, FNP   1 mg at 04/10/22 2109   magnesium hydroxide (MILK OF MAGNESIA) suspension 30 mL  30 mL Oral Daily PRN Lucky Rathke, FNP       multivitamin with  minerals tablet 1 tablet  1 tablet Oral Daily Lucky Rathke, FNP   1 tablet at 04/11/22 0731   ondansetron (ZOFRAN-ODT) disintegrating tablet 4 mg  4 mg Oral Q6H PRN Lucky Rathke, FNP   4 mg at 04/10/22 1437   QUEtiapine (SEROQUEL) tablet 50 mg  50 mg Oral QHS Hill, Jackie Plum, MD   50 mg at 04/10/22 2109   thiamine tablet 100 mg  100 mg Oral Daily Lucky Rathke, FNP   100 mg at 04/11/22 0731   topiramate (TOPAMAX) tablet 100 mg  100 mg Oral BID Lucky Rathke, FNP   100 mg at 04/11/22 0814   PTA Medications: Medications Prior to Admission  Medication Sig Dispense Refill Last Dose   escitalopram (LEXAPRO) 20 MG tablet Take 2 tablets (40 mg total) by mouth daily. 60 tablet 2    gabapentin (NEURONTIN) 100 MG capsule Take 2 capsules (200 mg total) by mouth 3 (three) times daily. 180 capsule 2    hydrOXYzine (ATARAX) 25 MG tablet Take 1 tablet (25 mg total) by mouth 3 (three) times daily as needed for anxiety. 90 tablet 1    Multiple Vitamin (MULTIVITAMIN WITH MINERALS) TABS tablet Take 1 tablet by mouth daily.      QUEtiapine (SEROQUEL) 100 MG tablet Take 1 tablet (100 mg total) by mouth at bedtime. 30 tablet  2    topiramate (TOPAMAX) 100 MG tablet Take 1 tablet (100 mg total) by mouth 2 (two) times daily. 60 tablet 2     Patient Stressors:    Patient Strengths:    Treatment Modalities: Medication Management, Group therapy, Case management,  1 to 1 session with clinician, Psychoeducation, Recreational therapy.   Physician Treatment Plan for Primary Diagnosis: MDD (major depressive disorder), recurrent severe, without psychosis ([place]) Long Term Goal(s): Improvement in symptoms so as ready for discharge   Short Term Goals: Ability to identify changes in lifestyle to reduce recurrence of condition will improve Ability to verbalize feelings will improve Ability to disclose and discuss suicidal ideas Ability to demonstrate self-control will improve Ability to identify and develop  effective coping behaviors will improve Ability to maintain clinical measurements within normal limits will improve Compliance with prescribed medications will improve Ability to identify triggers associated with substance abuse/mental health issues will improve  Medication Management: Evaluate patient's response, side effects, and tolerance of medication regimen.  Therapeutic Interventions: 1 to 1 sessions, Unit Group sessions and Medication administration.  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[place])   Portal hypertension ([place])   Substance induced mood disorder ([facility])   Alcohol dependence syndrome ([facility])   Thrombocytopenia ([facility])   Alcohol dependence with alcohol-induced mood disorder ([facility])   Pancytopenia ([place])   Alcohol use disorder, severe, dependence ([place])   Insomnia   Hypokalemia   Bipolar 1 disorder ([place])   Alcohol withdrawal ([place])   Alcohol use with alcohol-induced mood disorder ([place])   Anxiety state  Long Term Goal(s): Improvement in symptoms so as ready for discharge   Short Term Goals: Ability to identify changes in lifestyle to reduce recurrence of condition will improve Ability to verbalize feelings will improve Ability to disclose and discuss suicidal ideas Ability to demonstrate self-control will improve Ability to identify and develop effective coping behaviors will improve Ability to maintain clinical measurements within normal limits will improve Compliance with prescribed medications will improve Ability to identify triggers associated with substance abuse/mental health issues will improve     Medication Management: Evaluate patient's response, side effects, and tolerance of medication regimen.  Therapeutic Interventions: 1 to 1 sessions,  Unit Group sessions and Medication administration.  Evaluation of Outcomes: Progressing   RN Treatment Plan for Primary Diagnosis: MDD (major depressive disorder), recurrent severe, without psychosis ([place]) Long Term Goal(s): Knowledge of disease and therapeutic regimen to maintain health will improve  Short Term Goals: Ability to remain free from injury will improve, Ability to verbalize frustration and anger appropriately will improve, Ability to demonstrate self-control, Ability to participate in decision making will improve, Ability to verbalize feelings will improve, Ability to disclose and discuss suicidal ideas, Ability to identify and develop effective coping behaviors will improve, and Compliance with prescribed medications will improve  Medication Management: RN will administer medications as ordered by provider, will assess and evaluate patient's response and provide education to patient for prescribed medication. RN will report any adverse and/or side effects to prescribing provider.  Therapeutic Interventions: 1 on 1 counseling sessions, Psychoeducation, Medication administration, Evaluate responses to treatment, Monitor vital signs and CBGs as ordered, Perform/monitor CIWA, COWS, AIMS and Fall Risk screenings as ordered, Perform wound care treatments as ordered.  Evaluation of Outcomes: Progressing   LCSW Treatment Plan for Primary Diagnosis:  MDD (major depressive disorder), recurrent severe, without psychosis ([place]) Long Term Goal(s): Safe transition to appropriate next level of care at discharge, Engage patient in therapeutic group addressing interpersonal concerns.  Short Term Goals: Engage patient in aftercare planning with referrals and resources, Increase social support, Increase ability to appropriately verbalize feelings, Increase emotional regulation, Facilitate acceptance of mental health diagnosis and concerns, Facilitate patient progression through stages of change  regarding substance use diagnoses and concerns, Identify triggers associated with mental health/substance abuse issues, and Increase skills for wellness and recovery  Therapeutic Interventions: Assess for all discharge needs, 1 to 1 time with Social worker, Explore available resources and support systems, Assess for adequacy in community support network, Educate family and significant other(s) on suicide prevention, Complete Psychosocial Assessment, Interpersonal group therapy.  Evaluation of Outcomes: Progressing   Progress in Treatment: Attending groups: No. Participating in groups: No. Taking medication as prescribed: Yes. Toleration medication: Yes. Family/Significant other contact made: No, will contact:  husband Sara [name] [phone] or sister Sara [name] [phone] or son Sara [name] [phone] Patient understands diagnosis: Yes. Discussing patient identified problems/goals with staff: Yes. Medical problems stabilized or resolved: Yes. Denies suicidal/homicidal ideation: Yes. Issues/concerns per patient self-inventory: No. Other: none reported  New problem(s) identified: No, Describe:  none reported   New Short Term/Long Term Goal(s):   medication stabilization, elimination of SI thoughts, development of comprehensive mental wellness plan.    Patient Goals:  Patient states, " I would like to take meds, continue to eat and work on sleeping."    Discharge Plan or Barriers: Patient recently admitted. CSW will continue to follow and assess for appropriate referrals and possible discharge planning.    Reason for Continuation of Hospitalization: Depression Medication stabilization Suicidal ideation  Estimated Length of Stay: 3-5 days  Last 3 [place] Suicide Severity Risk Score: [place] Admission (Current) from 04/08/2022 in [facility] ED from 04/07/2022 in [facility] DEPT Video Visit from  03/01/2022 in [facility] High Risk High Risk No Risk       Last PHQ 2/9 Scores:    03/01/2022    5:19 PM 11/09/2021    5:17 PM 10/07/2021    5:59 AM  Depression screen PHQ 2/9  Decreased Interest '1 1 2  '$ Down, Depressed, Hopeless 0 1 2  PHQ - 2 Score '1 2 4  '$ Altered sleeping  2 3  Tired, decreased energy  2 2  Change in appetite  2 1  Feeling bad or failure about yourself   3 2  Trouble concentrating  3 1  Moving slowly or fidgety/restless  2 1  Suicidal thoughts  0 1  PHQ-9 Score  16 15  Difficult doing work/chores  Somewhat difficult Extremely dIfficult    Scribe for Treatment Team: Zachery Conch, LCSW 04/11/2022 11:05 AM
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
## 2022-04-11 NOTE — Progress Notes (Signed)
D. Pt reported sleeping well this am, but reported that she was still tired, remaining in bed missing am group. Per pt's self inventory, pt rated her depression, hopelessness and anxiety a 2/1/4, respectively. Pt reported some improvement in mouth ulcer pain. Pt reported that her goal today was "to go to group".  Pt denied withdrawal symptoms, SI/HI and AVH   A. Labs and vitals monitored. Pt given and educated on medications. Pt supported emotionally and encouraged to express concerns and ask questions.   R. Pt remains safe with 15 minute checks. Will continue POC.

## 2022-04-11 NOTE — BHH Suicide Risk Assessment (Signed)
[facility] INPATIENT:  Family/Significant Other Suicide Prevention Education  Suicide Prevention Education:  Contact Attempts: husband Sara [name] [phone], sister Sara [name] [phone], son Sara [name] [phone] has been identified by the patient as the family member/significant other with whom the patient will be residing, and identified as the person(s) who will aid the patient in the event of a mental health crisis.  With written consent from the patient, two attempts were made to provide suicide prevention education, prior to and/or following the patient's discharge.  We were unsuccessful in providing suicide prevention education.  A suicide education pamphlet was given to the patient to share with family/significant other.  CSW attempted to reach each of the family members listed above, unable to reach any family member.   Date and time of first attempt:04/11/2022 @ 12:39 PM  Date and time of second attempt: CSW will make additional attempts tor each family to complete SPE.   Durenda Hurt 04/11/2022, 12:39 PM

## 2022-04-11 NOTE — Progress Notes (Signed)
Patient did not attend morning orientation group.  

## 2022-04-11 NOTE — BHH Group Notes (Signed)
  Spiritual care group on grief and loss facilitated by chaplain Janne Napoleon, [facility]   Group Goal:   Support / Education around grief and loss   Members engage in facilitated group support and psycho-social education.   Group Description:   Following introductions and group rules, group members engaged in facilitated group dialog and support around topic of loss, with particular support around experiences of loss in their lives. Group Identified types of loss (relationships / self / things) and identified patterns, circumstances, and changes that precipitate losses. Reflected on thoughts / feelings around loss, normalized grief responses, and recognized variety in grief experience. Group noted Worden's four tasks of grief in discussion.   Group drew on Adlerian / Rogerian, narrative, MI,   Patient Progress: Did not attend group.

## 2022-04-12 MED ORDER — BACITRACIN-NEOMYCIN-POLYMYXIN OINTMENT TUBE
TOPICAL_OINTMENT | Freq: Two times a day (BID) | CUTANEOUS | Status: DC
Start: 2022-04-12 — End: 2022-04-14
  Administered 2022-04-12: 1 via TOPICAL
  Filled 2022-04-12 (×2): qty 14.17

## 2022-04-12 MED ORDER — QUETIAPINE FUMARATE ER 50 MG PO TB24
100.0000 mg | ORAL_TABLET | Freq: Every day | ORAL | Status: DC
  Administered 2022-04-12: 100 mg via ORAL
  Filled 2022-04-12 (×3): qty 2

## 2022-04-12 NOTE — Progress Notes (Addendum)
  On assessment, pt presented with moderate anxiety.  Pt is observed in day room interacting with peers.  Pt reports she has good family support.  Pt denies SI/HI and verbally contracts for safety.  Pt denies AVH.  Administered PRN Hydroxyzine for anxiety and One time dose Rozerem for insomnia per [facility] per pt request.  Pt is safe on the unit with Q 15 minute safety checks.    04/12/22 2130  Psych Admission Type (Psych Patients Only)  Admission Status Involuntary  Psychosocial Assessment  Patient Complaints Anxiety  Eye Contact Fair  Facial Expression Anxious  Affect Appropriate to circumstance  Speech Logical/coherent  Interaction Assertive  Motor Activity Slow  Appearance/Hygiene Unremarkable  Behavior Characteristics Appropriate to situation;Cooperative  Mood Pleasant;Anxious  Thought Process  Coherency WDL  Content WDL  Delusions None reported or observed  Perception WDL  Hallucination None reported or observed  Judgment Poor  Confusion None  Danger to Self  Current suicidal ideation? Denies  Self-Injurious Behavior No self-injurious ideation or behavior indicators observed or expressed   Agreement Not to Harm Self Yes  Description of Agreement verbal contract for safety  Danger to Others  Danger to Others None reported or observed

## 2022-04-12 NOTE — Progress Notes (Signed)
The patient rated her day as a 7 out of 10 and described her day as having been "okay". She states that she felt bad physically but mentally well. She is no longer experiencing anxiety.

## 2022-04-12 NOTE — Progress Notes (Signed)
   04/11/22 2300  Psych Admission Type (Psych Patients Only)  Admission Status Involuntary  Psychosocial Assessment  Patient Complaints Anxiety  Eye Contact Fair  Facial Expression Flat  Affect Appropriate to circumstance  Speech Logical/coherent  Interaction Assertive  Motor Activity Other (Comment) (WDL)  Appearance/Hygiene Unremarkable  Behavior Characteristics Appropriate to situation;Cooperative  Mood Pleasant  Thought Process  Coherency WDL  Content WDL  Delusions None reported or observed  Perception WDL  Hallucination None reported or observed  Judgment Poor  Danger to Self  Current suicidal ideation? Denies  Self-Injurious Behavior No self-injurious ideation or behavior indicators observed or expressed   Agreement Not to Harm Self Yes  Description of Agreement Verbal agreement  Danger to Others  Danger to Others None reported or observed

## 2022-04-12 NOTE — [facility] Suicide Risk Assessment (Addendum)
[place] INPATIENT:  Family/Significant Other Suicide Prevention Education  Suicide Prevention Education:  Family/Significant Other Refusal to Support Patient after Discharge:  Suicide Prevention Education Not Provided:  Patient has identified home of family/significant other as the place the patient will be residing after discharge.  With written consent of the patient, two attempts were made to provide Suicide Prevention Education to Cheryll Dessert, Husband. This person indicates he/she will not be responsible for the patient after discharge.  Per husband, "She is on these bullshit pills, the therapist is stupid and does not know anything. [name] Doristine Church to these groups and runs off with her little boyfriends and surrounds herself who validate her behaviors."   "We are trying to leave but she wont grant me the divorce.   Durenda Hurt 04/12/2022,12:27 PM
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
## 2022-04-12 NOTE — [facility] Suicide Risk Assessment (Signed)
[place] INPATIENT:  Family/Significant Other Suicide Prevention Education  Suicide Prevention Education:  Family/Significant Other Refusal to Support Patient after Discharge:  Suicide Prevention Education Not Provided:  Patient has identified home of family/significant other as the place the patient will be residing after discharge.  With written consent of the patient, two attempts were made to provide Suicide Prevention Education to Sara [name], son. This person indicates he/she will not be responsible for the patient after discharge.  Per son, "At this point I do not know what else we can do for her. . . I can not support her, I am leaving."  Sara [name] 04/12/2022,12:32 PM

## 2022-04-12 NOTE — Progress Notes (Signed)
Patient's self inventory sheet, patient has fair sleep, sleep medication given.  Fair appetite, normal energy level, good concentration.  Rated depression 1, denied hopeless, anxiety 3.  Denied withdrawals. Denied SI.  Denied physical problems.  Physical pain, worst pain 37 in past 24 hours, L arm stitch area.  Goal is work on discharge.  Plans to talk to medical team about discharge.  No discharge plans.

## 2022-04-12 NOTE — Group Note (Signed)
Recreation Therapy Group Note   Group Topic:Animal Assisted Therapy   Group Date: 04/12/2022 Start Time: 1430 End Time: 1510 Facilitators: [name], LRT,CTRS Location: [address]   Animal-Assisted Activity (AAA) Program Checklist/Progress Note Patient Eligibility Criteria Checklist & Daily Group note for Rec Tx Intervention  AAA/T Program Assumption of Risk Form signed by Patient/ or Parent Legal Guardian YES  Patient is free of allergies or severe asthma  YES  Patient reports no fear of animals YES  Patient reports no history of cruelty to animals YES  Patient understands their participation is voluntary YES  Patient washes hands before animal contact YES  Patient washes hands after animal contact YES   Group Description: Patients provided opportunity to interact with trained and credentialed [facility] [employer] Therapy dog and the community volunteer/dog handler. Patients practiced appropriate animal interaction and were educated on dog safety outside of the hospital in common community settings. Patients were allowed to use dog toys and other items to practice commands, engage the dog in play, and/or complete routine aspects of animal care.   Education: Contractor, Pensions consultant, Communication & Social Skills    Affect/Mood: Appropriate   Participation Level: Engaged    Clinical Observations/Individualized Feedback:  Pt attended and participated in group session.  Pt engaged with therapy dog team in an appropriate manner.    Plan: Continue to engage patient in RT group sessions 2-3x/week.   [name], Glennis Brink 04/12/2022 3:41 PM

## 2022-04-12 NOTE — [facility] Group Notes (Signed)
Adult Orientation Group Note  Date:  04/12/2022 Time:  10:55 AM  Group Topic/Focus:  Orientation:   The focus of this group is to educate the patient on the purpose and policies of crisis stabilization and provide a format to answer questions about their admission.  The group details unit policies and expectations of patients while admitted.  Participation Level:  Active  Participation Quality:  Appropriate  Affect:  Appropriate  Cognitive:  Appropriate  Insight: Appropriate  Engagement in Group:  Engaged  Modes of Intervention:  Education   Kern Reap 04/12/2022, 10:55 AM

## 2022-04-12 NOTE — Plan of Care (Signed)
°  Problem: Education: °Goal: Emotional status will improve °Outcome: Progressing °Goal: Mental status will improve °Outcome: Progressing °  °Problem: Activity: °Goal: Interest or engagement in activities will improve °Outcome: Progressing °  °

## 2022-04-13 LAB — CBC
HCT: 38.6 % (ref 36.0–46.0)
Hemoglobin: 13.1 g/dL (ref 12.0–15.0)
MCH: 30.4 pg (ref 26.0–34.0)
MCHC: 33.9 g/dL (ref 30.0–36.0)
MCV: 89.6 fL (ref 80.0–100.0)
Platelets: 86 10*3/uL — ABNORMAL LOW (ref 150–400)
RBC: 4.31 MIL/uL (ref 3.87–5.11)
RDW: 16.4 % — ABNORMAL HIGH (ref 11.5–15.5)
WBC: 3.1 10*3/uL — ABNORMAL LOW (ref 4.0–10.5)
nRBC: 0 % (ref 0.0–0.2)

## 2022-04-13 LAB — COMPREHENSIVE METABOLIC PANEL
ALT: 24 U/L (ref 0–44)
AST: 38 U/L (ref 15–41)
Albumin: 3.5 g/dL (ref 3.5–5.0)
Alkaline Phosphatase: 79 U/L (ref 38–126)
Anion gap: 5 (ref 5–15)
BUN: 9 mg/dL (ref 6–20)
CO2: 19 mmol/L — ABNORMAL LOW (ref 22–32)
Calcium: 8.9 mg/dL (ref 8.9–10.3)
Chloride: 115 mmol/L — ABNORMAL HIGH (ref 98–111)
Creatinine, Ser: 0.56 mg/dL (ref 0.44–1.00)
GFR, Estimated: >60 mL/min (ref >60)
Glucose, Bld: 94 mg/dL (ref 70–99)
Potassium: 3.4 mmol/L — ABNORMAL LOW (ref 3.5–5.1)
Sodium: 139 mmol/L (ref 135–145)
Total Bilirubin: 1.4 mg/dL — ABNORMAL HIGH (ref 0.3–1.2)
Total Protein: 7.5 g/dL (ref 6.5–8.1)

## 2022-04-13 LAB — AMMONIA: Ammonia: 51 umol/L — ABNORMAL HIGH (ref 9–35)

## 2022-04-13 MED ORDER — RAMELTEON 8 MG PO TABS
4.0000 mg | ORAL_TABLET | Freq: Once | ORAL | Status: AC
  Administered 2022-04-13: 4 mg via ORAL
  Filled 2022-04-13 (×2): qty 1

## 2022-04-13 MED ORDER — POTASSIUM CHLORIDE CRYS ER 20 MEQ PO TBCR
20.0000 meq | EXTENDED_RELEASE_TABLET | Freq: Two times a day (BID) | ORAL | Status: AC
  Administered 2022-04-13 – 2022-04-14 (×2): 20 meq via ORAL
  Filled 2022-04-13 (×4): qty 1

## 2022-04-13 MED ORDER — RAMELTEON 8 MG PO TABS
8.0000 mg | ORAL_TABLET | Freq: Every day | ORAL | Status: DC
  Administered 2022-04-13: 8 mg via ORAL
  Filled 2022-04-13 (×3): qty 1

## 2022-04-13 MED ORDER — MELATONIN 3 MG PO TABS
3.0000 mg | ORAL_TABLET | Freq: Every day | ORAL | Status: DC
  Filled 2022-04-13 (×2): qty 1

## 2022-04-13 MED ORDER — QUETIAPINE FUMARATE 100 MG PO TABS
100.0000 mg | ORAL_TABLET | Freq: Every day | ORAL | Status: DC
  Administered 2022-04-13: 100 mg via ORAL
  Filled 2022-04-13 (×3): qty 1

## 2022-04-13 NOTE — [facility] Group Notes (Signed)
The focus of this group is to help patients establish daily goals to achieve during treatment and discuss how the patient can incorporate goal setting into their daily lives to aide in recovery.  Pt did not attend group 

## 2022-04-13 NOTE — Progress Notes (Signed)
Pt's wound in left arm is red and indurated.  NP notified.  Neosporin applied.

## 2022-04-13 NOTE — Group Note (Signed)
Recreation Therapy Group Note   Group Topic:Team Building  Group Date: 04/13/2022 Start Time: 0930 End Time: 1000 Facilitators: [name], LRT,CTRS Location: [address] Day[address]   Goal Area(s) Addresses:  Patient will effectively work with peer towards shared goal.  Patient will identify skills used to make activity successful.  Patient will identify how skills used during activity can be applied to reach post d/c goals.    Group Description: [employer]. In teams of 5-6, patients were given 25 small craft pipe cleaners. Using the materials provided, patients were instructed to compete again the opposing team(s) to build the tallest free-standing structure from floor level. The activity was timed; difficulty increased by Probation officer as Pharmacist, hospital continued.  Systematically resources were removed with additional directions for example, placing one arm behind their back, working in silence, and shape stipulations. LRT facilitated post-activity discussion reviewing team processes and necessary communication skills involved in completion. Patients were encouraged to reflect how the skills utilized, or not utilized, in this activity can be incorporated to positively impact support systems post discharge.   Affect/Mood: Appropriate   Participation Level: None   Participation Quality: N/A   Behavior: Appropriate   Speech/Thought Process: N/A   Insight: N/A   Judgement: N/A   Modes of Intervention: Team-building   Patient Response to Interventions:  Observed   Education Outcome:  In group clarification offered    Clinical Observations/Individualized Feedback: Pt observed as peers worked on their towers.      Plan: Continue to engage patient in RT group sessions 2-3x/week.   [name], LRT,CTRS 04/13/2022 11:27 AM

## 2022-04-13 NOTE — Progress Notes (Signed)
   04/13/22 0945  Psych Admission Type (Psych Patients Only)  Admission Status Involuntary  Psychosocial Assessment  Patient Complaints Anxiety  Eye Contact Brief  Facial Expression Flat  Affect Blunted  Speech Logical/coherent  Interaction Assertive  Motor Activity Slow  Appearance/Hygiene Unremarkable  Behavior Characteristics Appropriate to situation  Mood Pleasant;Anxious  Thought Process  Coherency WDL  Content WDL  Delusions None reported or observed  Perception WDL  Hallucination None reported or observed  Judgment Poor  Confusion None  Danger to Self  Current suicidal ideation? Denies  Danger to Others  Danger to Others None reported or observed   Pt presents with flat affect.  Pt tolerating medications without distress.  Pt denies SI/HI/AVH.

## 2022-04-13 NOTE — Progress Notes (Signed)
[facility] LCSW Progress Note   04/13/2022    11:53 AM  Sara [name]   [id number]   Type of Contact and Topic:  Discharge Planning   CSW met with patient in order to discuss housing plan after discharge. Patient states she needs to pack her belongings at her current residence prior to the 12 June move out date. She is currently considering boarding houses. Patient provided information for shelter resources.   Patient is currently unemployed w/ no financial support.   Patient scheduled 05/03/2022 at [facility] outpatient for outpatient mental health and addiction treatment.    Signed:  Durenda Hurt, MSW, LCSWA, LCAS 04/13/2022 11:53 AM

## 2022-04-13 NOTE — [facility] Group Notes (Signed)
Adult Psychoeducational Group Note  Date:  04/13/2022 Time:  11:13 AM  Group Topic/Focus:  Healthy Communication:   The focus of this group is to discuss communication, barriers to communication, as well as healthy ways to communicate with others.  Participation Level:  Did Not Attend   Dub Mikes 04/13/2022, 11:13 AM

## 2022-04-13 NOTE — [facility] Group Notes (Signed)
PT attended NA group and was appropriate and attentive.

## 2022-04-13 NOTE — Group Note (Signed)
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
## 2022-04-14 ENCOUNTER — Other Ambulatory Visit: Payer: Self-pay

## 2022-04-14 ENCOUNTER — Encounter: Payer: Self-pay | Admitting: Hematology and Oncology

## 2022-04-14 MED ORDER — HYDROXYZINE HCL 50 MG PO TABS
50.0000 mg | ORAL_TABLET | Freq: Three times a day (TID) | ORAL | 0 refills | Status: DC | PRN
  Filled 2022-04-14: qty 30, 10d supply, fill #0

## 2022-04-14 MED ORDER — LACTULOSE 10 GM/15ML PO SOLN
20.0000 g | Freq: Two times a day (BID) | ORAL | Status: DC
  Filled 2022-04-14 (×3): qty 300

## 2022-04-14 MED ORDER — GABAPENTIN 100 MG PO CAPS
200.0000 mg | ORAL_CAPSULE | Freq: Three times a day (TID) | ORAL | 0 refills | Status: DC
  Filled 2022-04-14: qty 180, 30d supply, fill #0

## 2022-04-14 MED ORDER — QUETIAPINE FUMARATE 100 MG PO TABS
100.0000 mg | ORAL_TABLET | Freq: Every day | ORAL | 0 refills | Status: DC
  Filled 2022-04-14: qty 30, 30d supply, fill #0

## 2022-04-14 MED ORDER — LACTULOSE 10 GM/15ML PO SOLN
10.0000 g | Freq: Two times a day (BID) | ORAL | Status: DC
  Filled 2022-04-14 (×3): qty 150

## 2022-04-14 MED ORDER — BENZOCAINE 10 % MT GEL: Freq: Three times a day (TID) | OROMUCOSAL | 0 refills | Status: DC | PRN

## 2022-04-14 MED ORDER — LACTULOSE 10 GM/15ML PO SOLN
30.0000 g | Freq: Two times a day (BID) | ORAL | 0 refills | Status: AC
Start: 2022-04-14 — End: 2022-04-21
  Filled 2022-04-14: qty 630, 7d supply, fill #0

## 2022-04-14 MED ORDER — RAMELTEON 8 MG PO TABS
8.0000 mg | ORAL_TABLET | Freq: Every day | ORAL | 0 refills | Status: DC
  Filled 2022-04-14: qty 30, 30d supply, fill #0

## 2022-04-14 MED ORDER — TOPIRAMATE 100 MG PO TABS
100.0000 mg | ORAL_TABLET | Freq: Two times a day (BID) | ORAL | 0 refills | Status: DC
  Filled 2022-04-14: qty 60, 30d supply, fill #0

## 2022-04-14 MED ORDER — ESCITALOPRAM OXALATE 20 MG PO TABS
20.0000 mg | ORAL_TABLET | Freq: Every day | ORAL | 0 refills | Status: DC
  Filled 2022-04-14: qty 30, 30d supply, fill #0

## 2022-04-14 MED ORDER — BACITRACIN-NEOMYCIN-POLYMYXIN OINTMENT TUBE: 1.0000 "application " | TOPICAL_OINTMENT | Freq: Two times a day (BID) | CUTANEOUS | Status: DC

## 2022-04-14 NOTE — Discharge Instructions (Addendum)
-  Follow-up with your outpatient psychiatric provider -instructions on appointment date, time, and address (location) are provided to you in discharge paperwork.  -Take your psychiatric medications as prescribed at discharge - instructions are provided to you in the discharge paperwork  -Follow-up with outpatient primary care doctor and other specialists -for management of chronic medical disease, including:  -Wound care of Lt arm laceration - followup at walk in urgent care or emergency department today 04-14-22  -PCP follow-up for elevated ammonia level and liver disease   -Testing: Follow-up with outpatient provider for abnormal lab results:  04-13-22: Ammonia 51  -Recommend abstinence from alcohol, tobacco, and other illicit drug use at discharge.   -If your psychiatric symptoms recur, worsen, or if you have side effects to your psychiatric medications, call your outpatient psychiatric provider, 911, [phone] or go to the nearest emergency department.  -If suicidal thoughts recur, call your outpatient psychiatric provider, 911, [phone] or go to the nearest emergency department.
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
## 2022-04-14 NOTE — Group Note (Signed)
Date:  04/14/2022 Time:  10:15 AM  Group Topic/Focus:  Orientation:   The focus of this group is to educate the patient on the purpose and policies of crisis stabilization and provide a format to answer questions about their admission.  The group details unit policies and expectations of patients while admitted.    Participation Level:  Active  Participation Quality:  Appropriate  Affect:  Appropriate  Cognitive:  Appropriate  Insight: Appropriate  Engagement in Group:  Engaged  Modes of Intervention:  Discussion  Additional Comments:    Garvin Fila 04/14/2022, 10:15 AM
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
## 2022-04-14 NOTE — Progress Notes (Signed)
Discharge note: Survey completed. RN met with pt and reviewed pt's discharge instructions. Pt verbalized understanding of discharge instructions and pt did not have any questions. RN reviewed and provided pt with a copy of SRA, AVS and Transition Record. RN returned pt's belongings to pt. Samples were given to pt. Pt denied SI/HI/AVH and voiced no concerns. Pt was appreciative of the care pt received at [facility]. Patient discharged to the lobby without incident.   04/14/22 0809  Psych Admission Type (Psych Patients Only)  Admission Status Involuntary  Psychosocial Assessment  Patient Complaints Depression;Anxiety  Eye Contact Fair  Facial Expression Flat  Affect Anxious;Appropriate to circumstance  Speech Logical/coherent  Interaction Assertive  Motor Activity Slow  Appearance/Hygiene Unremarkable  Behavior Characteristics Cooperative;Appropriate to situation;Anxious  Mood Pleasant  Thought Process  Coherency WDL  Content WDL  Delusions None reported or observed  Perception WDL  Hallucination None reported or observed  Judgment Impaired  Confusion None  Danger to Self  Current suicidal ideation? Denies  Danger to Others  Danger to Others None reported or observed

## 2022-04-14 NOTE — Progress Notes (Signed)
   04/14/22 0000  Psych Admission Type (Psych Patients Only)  Admission Status Involuntary  Psychosocial Assessment  Patient Complaints Anxiety  Eye Contact Fair  Facial Expression Sad;Flat  Affect Depressed  Speech Logical/coherent  Interaction Assertive  Motor Activity Slow  Appearance/Hygiene Improved  Behavior Characteristics Appropriate to situation  Mood Pleasant  Thought Process  Coherency WDL  Content WDL  Delusions None reported or observed  Perception WDL  Hallucination None reported or observed  Judgment Poor  Confusion None  Danger to Self  Current suicidal ideation? Denies  Self-Injurious Behavior No self-injurious ideation or behavior indicators observed or expressed   Agreement Not to Harm Self Yes  Description of Agreement verbal  Danger to Others  Danger to Others None reported or observed     04/14/22 0000  Psych Admission Type (Psych Patients Only)  Admission Status Involuntary  Psychosocial Assessment  Patient Complaints Anxiety  Eye Contact Fair  Facial Expression Sad;Flat  Affect Depressed  Speech Logical/coherent  Interaction Assertive  Motor Activity Slow  Appearance/Hygiene Improved  Behavior Characteristics Appropriate to situation  Mood Pleasant  Thought Process  Coherency WDL  Content WDL  Delusions None reported or observed  Perception WDL  Hallucination None reported or observed  Judgment Poor  Confusion None  Danger to Self  Current suicidal ideation? Denies  Self-Injurious Behavior No self-injurious ideation or behavior indicators observed or expressed   Agreement Not to Harm Self Yes  Description of Agreement verbal  Danger to Others  Danger to Others None reported or observed

## 2022-04-14 NOTE — Progress Notes (Signed)
  [facility] Adult Case Management Discharge Plan :  Will you be returning to the same living situation after discharge:  Yes,  Patient to return to place of residence in order to pack her belongings prior to move out date on 12 June. Patient is unsure of her living plans after this date. Patient was provided with contact information for all emergency shelters, long term shelters, and boarding houses in the region.   At discharge, do you have transportation home?: Yes,    Patient will be using the bus for transportation home. [employer] bus is free to ride; has been provided with bus schedule/map. Patient has expressed they are confident in navigating bus system.   Do you have the ability to pay for your medications: Yes,  [facility]. Physician has requested 7 day supply of medication at discharge. CSW has provided patient with clinic information for free/reduced medications.   Release of information consent forms completed and in the chart;  Patient's signature needed at discharge.  Patient to Follow up at:  [facility] Follow up on 05/03/2022.   Specialty: Urgent Care Why: You have an appointment for medication management services on 05/03/22 at 5:00 pm.  You may go to this provider for interim therapy services on Monday or Wednesday at 7:30 am. Contact information: [place] [address] [place] on 04/14/2022.   Specialty: Professional Counselor Why: You have an appointment to re-establish care with this provider for therapy services on 04/14/22   at 9:00 am. Contact information: [employer] [facility] [place] [address] [phone]         [facility] Follow up.   Why: Please call to set up an appointment as soon as possible for regular physical health maintenance. Contact information: [place] [address] [phone]                Next level of care provider has access to [place] and Suicide Prevention discussed: Yes,  SPE completed with patient, family has refused to support patient after discharge; Physician has been notified.    Has patient been referred to the Quitline?: Patient refused referral Tobacco Use: Medium Risk   Smoking Tobacco Use: Former   Smokeless Tobacco Use: Never   Passive Exposure: Not on file    Patient has been referred for addiction treatment: Pt. refused referral Patient has outright refused alcohol and drug counseling multiple times. Walking information for [employer]. [place] outpatient clinic provided in AVS.   Larose Kells 04/14/2022, 10:45 AM

## 2022-04-19 ENCOUNTER — Encounter: Payer: Self-pay | Admitting: Hematology and Oncology

## 2022-04-21 ENCOUNTER — Other Ambulatory Visit: Payer: Self-pay

## 2022-05-03 ENCOUNTER — Telehealth (INDEPENDENT_AMBULATORY_CARE_PROVIDER_SITE_OTHER): Payer: No Payment, Other | Admitting: Physician Assistant

## 2022-05-03 DIAGNOSIS — F1010 Alcohol abuse, uncomplicated: Secondary | ICD-10-CM

## 2022-05-03 DIAGNOSIS — G479 Sleep disorder, unspecified: Secondary | ICD-10-CM

## 2022-05-03 DIAGNOSIS — F319 Bipolar disorder, unspecified: Secondary | ICD-10-CM

## 2022-05-03 DIAGNOSIS — F515 Nightmare disorder: Secondary | ICD-10-CM | POA: Diagnosis not present

## 2022-05-03 DIAGNOSIS — F411 Generalized anxiety disorder: Secondary | ICD-10-CM | POA: Diagnosis not present

## 2022-05-03 MED ORDER — GABAPENTIN 400 MG PO CAPS
400.0000 mg | ORAL_CAPSULE | Freq: Three times a day (TID) | ORAL | 2 refills | Status: DC
  Filled 2022-05-03: qty 90, 30d supply, fill #0
  Filled 2022-07-25: qty 90, 30d supply, fill #1
  Filled 2022-08-31: qty 90, 30d supply, fill #2

## 2022-05-03 MED ORDER — HYDROXYZINE HCL 50 MG PO TABS
50.0000 mg | ORAL_TABLET | Freq: Three times a day (TID) | ORAL | 2 refills | Status: DC | PRN
  Filled 2022-05-03: qty 75, 25d supply, fill #0
  Filled 2022-06-14: qty 75, 25d supply, fill #1
  Filled 2022-07-25: qty 75, 25d supply, fill #2

## 2022-05-03 MED ORDER — RAMELTEON 8 MG PO TABS
8.0000 mg | ORAL_TABLET | Freq: Every day | ORAL | 2 refills | Status: DC
  Filled 2022-05-03: qty 30, 30d supply, fill #0

## 2022-05-03 MED ORDER — PRAZOSIN HCL 1 MG PO CAPS
1.0000 mg | ORAL_CAPSULE | Freq: Every day | ORAL | 2 refills | Status: DC
  Filled 2022-05-03: qty 30, 30d supply, fill #0

## 2022-05-03 MED ORDER — TOPIRAMATE 100 MG PO TABS
100.0000 mg | ORAL_TABLET | Freq: Two times a day (BID) | ORAL | 2 refills | Status: DC
  Filled 2022-05-03: qty 60, 30d supply, fill #0
  Filled 2022-07-25: qty 60, 30d supply, fill #1
  Filled 2022-08-31: qty 60, 30d supply, fill #2

## 2022-05-03 MED ORDER — ESCITALOPRAM OXALATE 20 MG PO TABS
20.0000 mg | ORAL_TABLET | Freq: Every day | ORAL | 2 refills | Status: DC
  Filled 2022-05-03: qty 30, 30d supply, fill #0
  Filled 2022-07-25: qty 30, 30d supply, fill #1
  Filled 2022-08-31: qty 30, 30d supply, fill #2

## 2022-05-03 MED ORDER — QUETIAPINE FUMARATE 100 MG PO TABS
100.0000 mg | ORAL_TABLET | Freq: Every day | ORAL | 2 refills | Status: DC
  Filled 2022-05-03: qty 30, 30d supply, fill #0
  Filled 2022-06-14: qty 30, 30d supply, fill #1
  Filled 2022-07-25: qty 30, 30d supply, fill #2

## 2022-05-03 NOTE — Progress Notes (Signed)
BH MD/PA/NP OP Progress Note  Virtual Visit via Telephone Note  I connected with Sara [name] on 05/03/22 at  5:00 PM EDT by telephone and verified that I am speaking with the correct person using two identifiers.  Location: Patient: Home Provider: Clinic   I discussed the limitations, risks, security and privacy concerns of performing an evaluation and management service by telephone and the availability of in person appointments. I also discussed with the patient that there may be a patient responsible charge related to this service. The patient expressed understanding and agreed to proceed.  Follow Up Instructions:   I discussed the assessment and treatment plan with the patient. The patient was provided an opportunity to ask questions and all were answered. The patient agreed with the plan and demonstrated an understanding of the instructions.   The patient was advised to call back or seek an in-person evaluation if the symptoms worsen or if the condition fails to improve as anticipated.  I provided 17 minutes of non-face-to-face time during this encounter.  [name], PA   05/03/2022 5:56 PM [name]  MRN:  [id number]  Chief Complaint:  No chief complaint on file.  HPI:   Sara [name]. Delis is a 46 year old female with a past psychiatric history significant for generalized anxiety disorder, bipolar 1 disorder, and alcohol abuse who presents to [facility] via virtual telephone visit for follow-up and medication management.  Patient is currently being managed on the following medications:  Seroquel 100 mg at bedtime Lexapro 40 mg daily Topiramate 100 mg 2 times daily Gabapentin 200 mg 3 times daily Hydroxyzine 25 mg 3 times daily as needed  Patient was recently discharged from [facility] on 04/14/2022.  When asked the reason for her admission to [facility], patient replied that she may  have been admitted due to symptoms of mania.  Per chart review, patient was admitted to [facility] on 04/08/2022 after being involuntarily committed by her husband.  Patient was brought by [facility] from [facility] ED due to worsening suicidal ideations and cutting her left wrist.  Patient was discharged on 04/14/2022 on her previous medication regimen.  Since discharge from [facility], patient reports that she has been doing well but occasionally feels sad.  She reports that her anxiety is still a problem and is having really bad nightmares.  Patient attributes her worsening anxiety to her nightmares.  Patient rates her anxiety a 7 out of 10.  Patient's stressors include her youngest son leaving home and her failing marriage.  Patient also endorses financial stressors.  Patient endorses seeing a therapist regularly but states that her therapy sessions have not been helpful.  Patient states that she only feels worsening anxiety when seeing her therapist.  A PHQ-9 screen was performed with the patient scoring a 17.  A GAD-7 screen was also performed with the patient scoring a 21.  Patient is alert and oriented x4, calm, cooperative, and fully engaged in conversation during the encounter.  Patient endorses feeling very anxious and moody.  Patient denies suicidal or homicidal ideations.  She further denies auditory or visual hallucinations and does not appear to be responding to internal/external stimuli.  Patient endorses poor sleep and states that she has not been sleeping.  Patient endorses fair appetite and eats on average 2 meals per day.  Patient endorses alcohol consumption sporadically.  Patient denies tobacco use and illicit drug use.  Visit Diagnosis:    ICD-10-CM  1. Nightmares  F51.5 prazosin (MINIPRESS) 1 MG capsule    2. Generalized anxiety disorder  F41.1 gabapentin (NEURONTIN) 400 MG capsule    escitalopram (LEXAPRO) 20 MG tablet    hydrOXYzine (ATARAX) 50 MG tablet    3. Bipolar 1 disorder (HCC)  F31.9  QUEtiapine (SEROQUEL) 100 MG tablet    topiramate (TOPAMAX) 100 MG tablet    4. Sleep disturbances  G47.9 ramelteon (ROZEREM) 8 MG tablet      Past Psychiatric History:  Generalized Anxiety Disorder Bipolar Disorder PTSD  Past Medical History:  Past Medical History:  Diagnosis Date   Alcohol abuse    Alcoholism ([place])    Anemia    Anxiety    Blood transfusion without reported diagnosis    Cirrhosis ([place])    Depression    Esophageal varices with bleeding(456.0) 06/13/2014   GERD (gastroesophageal reflux disease)    Heart murmur    Patient states she may have   Menorrhagia    Pancytopenia ([place]) 01/15/2014   Pneumonia    Portal hypertension ([place])    S/P alcohol detoxification    2-3 days at behavioral health previously   UGI bleed 06/12/2014    Past Surgical History:  Procedure Laterality Date   CHOLECYSTECTOMY     ESOPHAGOGASTRODUODENOSCOPY N/A 06/12/2014   Procedure: ESOPHAGOGASTRODUODENOSCOPY (EGD);  Surgeon: Gatha Mayer, MD;  Location: [facility];  Service: Endoscopy;  Laterality: N/A;   ESOPHAGOGASTRODUODENOSCOPY (EGD) WITH PROPOFOL N/A 07/29/2014   Procedure: ESOPHAGOGASTRODUODENOSCOPY (EGD) WITH PROPOFOL;  Surgeon: Inda Castle, MD;  Location: [facility];  Service: Endoscopy;  Laterality: N/A;   ESOPHAGOGASTRODUODENOSCOPY (EGD) WITH PROPOFOL N/A 01/20/2018   Procedure: ESOPHAGOGASTRODUODENOSCOPY (EGD) WITH PROPOFOL;  Surgeon: Mauri Pole, MD;  Location: [facility];  Service: Endoscopy;  Laterality: N/A;    Family Psychiatric History:  None  Family History:  Family History  Problem Relation Age of Onset   Colon polyps Mother    Hypertension Mother    Thyroid disease Mother    Alcoholism Mother    Alcoholism Father    Alcohol abuse Maternal Grandfather    Alcohol abuse Paternal Grandfather    Alcohol abuse Paternal Aunt    Alcohol abuse Maternal Uncle     Social History:  Social History   Socioeconomic History   Marital status: Married     Spouse name: [name]   Number of children: 2   Years of education: Associates   Highest education level: Not on file  Occupational History   Occupation: Paralegal  Tobacco Use   Smoking status: Former    Packs/day: 0.25    Types: Cigarettes    Quit date: 11/22/2018    Years since quitting: 3.4   Smokeless tobacco: Never  Substance and Sexual Activity   Alcohol use: Yes    Alcohol/week: 0.0 standard drinks of alcohol    Comment: Usually drinks 2-3 bottles of wine daily when drinking.    Drug use: No    Types: Cocaine, Marijuana    Comment: denies   Sexual activity: Never    Birth control/protection: None  Other Topics Concern   Not on file  Social History Narrative   Lives with husband and son. Daughter lives nearby. 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   Social Determinants of Health   Financial Resource Strain: Not on file  Food Insecurity: Not on file  Transportation Needs: Not on file  Physical Activity: Not on file  Stress: Not on file  Social Connections: Not  on file    Allergies:  Allergies  Allergen Reactions   Morphine And Related Other (See Comments)    Slowed HR, lowered BP   Nsaids Other (See Comments)    Caused internal bleeding    Metabolic Disorder Labs: Lab Results  Component Value Date   HGBA1C 4.9 04/10/2022   MPG 93.93 04/10/2022   MPG 88.19 01/24/2022   Lab Results  Component Value Date   PROLACTIN 28.7 (H) 05/04/2017   Lab Results  Component Value Date   CHOL 121 04/10/2022   TRIG 95 04/10/2022   HDL 37 (L) 04/10/2022   CHOLHDL 3.3 04/10/2022   VLDL 19 04/10/2022   LDLCALC 65 04/10/2022   LDLCALC 67 01/24/2022   Lab Results  Component Value Date   TSH 3.012 01/24/2022   TSH 1.153 01/20/2021    Therapeutic Level Labs: No results found for: "LITHIUM" No results found for: "VALPROATE" No results found for: "CBMZ"  Current Medications: Current Outpatient Medications  Medication Sig Dispense  Refill   prazosin (MINIPRESS) 1 MG capsule Take 1 capsule (1 mg total) by mouth at bedtime. 30 capsule 2   benzocaine (ORAJEL) 10 % mucosal gel Use as directed in the mouth or throat 3 (three) times daily as needed for mouth pain. 5.3 g 0   escitalopram (LEXAPRO) 20 MG tablet Take 1 tablet (20 mg total) by mouth daily. 30 tablet 2   gabapentin (NEURONTIN) 400 MG capsule Take 1 capsule (400 mg total) by mouth 3 (three) times daily. 90 capsule 2   hydrOXYzine (ATARAX) 50 MG tablet Take 1 tablet (50 mg total) by mouth 3 (three) times daily as needed for anxiety. 75 tablet 2   Multiple Vitamin (MULTIVITAMIN WITH MINERALS) TABS tablet Take 1 tablet by mouth daily.     neomycin-bacitracin-polymyxin (NEOSPORIN) OINT Apply 1 application. topically 2 (two) times daily.     QUEtiapine (SEROQUEL) 100 MG tablet Take 1 tablet (100 mg total) by mouth at bedtime. 30 tablet 2   ramelteon (ROZEREM) 8 MG tablet Take 1 tablet (8 mg total) by mouth at bedtime. 30 tablet 2   topiramate (TOPAMAX) 100 MG tablet Take 1 tablet (100 mg total) by mouth 2 (two) times daily. 60 tablet 2   No current facility-administered medications for this visit.     Musculoskeletal: Strength & Muscle Tone: Unable to assess due to telemedicine visit [place]: Unable to assess due to telemedicine visit Patient leans: Unable to assess due to telemedicine visit  Psychiatric Specialty Exam: Review of Systems  Psychiatric/Behavioral:  Negative for decreased concentration, dysphoric mood, hallucinations, self-injury, sleep disturbance and suicidal ideas. The patient is nervous/anxious. The patient is not hyperactive.     There were no vitals taken for this visit.There is no height or weight on file to calculate BMI.  General Appearance: Unable to assess due to telemedicine visit  Eye Contact:  Unable to assess due to telemedicine visit  Speech:  Clear and Coherent and Normal Rate  Volume:  Normal  Mood:  Euthymic  Affect:   Appropriate  Thought Process:  Coherent, Goal Directed, and Descriptions of Associations: Intact  Orientation:  Full (Time, Place, and Person)  Thought Content: WDL   Suicidal Thoughts:  No  Homicidal Thoughts:  No  Memory:  Immediate;   Good Recent;   Good Remote;   Good  Judgement:  Good  Insight:  Good  Psychomotor Activity:  Normal  Concentration:  Concentration: Good and Attention Span: Good  Recall:  Good  Fund of Knowledge: Good  Language: Good  Akathisia:  No  Handed:  Right  AIMS (if indicated): not done  Assets:  Communication Skills Desire for Improvement Housing Social Support  ADL's:  Intact  Cognition: WNL  Sleep:  Good   Screenings: AIMS    Flowsheet Row Admission (Discharged) from 04/08/2022 in [facility] Admission (Discharged) from 04/19/2019 in [facility] Admission (Discharged) from 05/02/2017 in [facility]  AIMS Total Score 0 0 0      AUDIT    Flowsheet Row Admission (Discharged) from 04/08/2022 in [facility] Admission (Discharged) from 04/19/2019 in [facility] Admission (Discharged) from 05/02/2017 in [facility] Admission (Discharged) from 12/22/2014 in [facility] Admission (Discharged) from 02/02/2014 in [facility]  Alcohol Use Disorder Identification Test Final Score (AUDIT) 14 34 34 29 38      GAD-7    Flowsheet Row Video Visit from 05/03/2022 in [facility] Video Visit from 03/01/2022 in [facility] Video Visit from 11/09/2021 in [facility] Video Visit from 07/08/2021 in [facility] Video Visit from 05/13/2021 in [facility]  Total GAD-7 Score  '21 6 16 18 16      '$ PHQ2-9    Flowsheet Row Video Visit from 05/03/2022 in [facility] Video Visit from 03/01/2022 in [facility] Video Visit from 11/09/2021 in [facility] ED from 10/06/2021 in [place] [facility] Video Visit from 07/08/2021 in [facility]  PHQ-2 Total Score '2 1 2 4 2  '$ PHQ-9 Total Score 17 -- '16 15 9      '$ Flowsheet Row Video Visit from 05/03/2022 in [facility] Admission (Discharged) from 04/08/2022 in [facility] ED from 04/07/2022 in [place] [facility]PT  C-SSRS RISK CATEGORY No Risk High Risk High Risk        Assessment and Plan:   Kennette Cuthrell. Meldrum is a 46 year old female with a past psychiatric history significant for generalized anxiety disorder, bipolar 1 disorder, and alcohol abuse who presents to [facility] via virtual telephone visit for follow-up and medication management.  Patient was recently discharged from [facility] due to suicidal ideations and suicide attempt.  Since being discharged from the hospital patient states that she still continues to experience sadness and worsening anxiety.  Patient attributes some of her anxiety to the nightmares she has been experiencing.  Patient also endorses not sleeping at all.  Patient was recommended prazosin 1 mg at bedtime for the management of her nightmares.  Patient was also recommended ramelteon 8 mg at bedtime for the management of her sleep.  Patient was agreeable to recommendations.  Patient's medications to be e-prescribed to pharmacy of choice.  Collaboration of Care: Collaboration of Care: Medication Management AEB provider managing patient's psychiatric medications, Primary Care Provider AEB and being followed by her primary care provider, and Psychiatrist  AEB patient being followed by mental health provider  Patient/Guardian was advised Release of Information must be obtained prior to any record release in order to collaborate their care with an outside provider. Patient/Guardian was advised if they have not already done so to contact the registration  department to sign all necessary forms in order for [place] to release information regarding their care.   Consent: Patient/Guardian gives verbal consent for treatment and assignment of benefits for services provided during this visit. Patient/Guardian expressed understanding and agreed to proceed.   1. Generalized anxiety disorder  - gabapentin (NEURONTIN) 400 MG capsule; Take 1 capsule (400 mg total) by mouth 3 (three) times daily.  Dispense: 90 capsule; Refill: 2 - escitalopram (LEXAPRO) 20 MG tablet; Take 1 tablet (20 mg total) by mouth once daily.  Dispense: 30 tablet; Refill: 2 - hydrOXYzine (ATARAX) 50 MG tablet; Take 1 tablet (50 mg total) by mouth 3 (three) times daily as needed for anxiety.  Dispense: 75 tablet; Refill: 2  2. Bipolar 1 disorder (HCC)  - QUEtiapine (SEROQUEL) 100 MG tablet; Take 1 tablet (100 mg total) by mouth once nightly at bedtime.  Dispense: 30 tablet; Refill: 2 - topiramate (TOPAMAX) 100 MG tablet; Take 1 tablet (100 mg total) by mouth 2 (two) times daily.  Dispense: 60 tablet; Refill: 2  3. Nightmares  - prazosin (MINIPRESS) 1 MG capsule; Take 1 capsule (1 mg total) by mouth once nightly at bedtime.  Dispense: 30 capsule; Refill: 2  4. Sleep disturbances  - ramelteon (ROZEREM) 8 MG tablet; Take 1 tablet (8 mg total) by mouth once nightly at bedtime.  Dispense: 30 tablet; Refill: 2  5. Alcohol abuse   Patient to follow up in 7 weeks Provider spent a total of 17 minutes with the patient/reviewing patient's chart  [name], PA 05/03/2022, 5:56 PM

## 2022-05-04 ENCOUNTER — Encounter: Payer: Self-pay | Admitting: Hematology and Oncology

## 2022-05-04 ENCOUNTER — Other Ambulatory Visit: Payer: Self-pay

## 2022-05-06 ENCOUNTER — Encounter (HOSPITAL_COMMUNITY): Payer: Self-pay | Admitting: Physician Assistant

## 2022-05-10 ENCOUNTER — Other Ambulatory Visit: Payer: Self-pay

## 2022-06-02 ENCOUNTER — Telehealth: Payer: Self-pay | Admitting: Hematology and Oncology

## 2022-06-02 NOTE — Telephone Encounter (Signed)
Scheduled appointment per provider admin time. Left message.

## 2022-06-06 ENCOUNTER — Ambulatory Visit (HOSPITAL_COMMUNITY)
Admission: EM | Admit: 2022-06-06 | Discharge: 2022-06-07 | Disposition: A | Discharge status: Other Acute Inpatient Hospital | Payer: No Payment, Other | Attending: Psychiatry | Admitting: Psychiatry

## 2022-06-06 DIAGNOSIS — Z20822 Contact with and (suspected) exposure to covid-19: Secondary | ICD-10-CM | POA: Insufficient documentation

## 2022-06-06 DIAGNOSIS — F69 Unspecified disorder of adult personality and behavior: Secondary | ICD-10-CM

## 2022-06-06 DIAGNOSIS — F10129 Alcohol abuse with intoxication, unspecified: Secondary | ICD-10-CM

## 2022-06-06 DIAGNOSIS — F319 Bipolar disorder, unspecified: Secondary | ICD-10-CM | POA: Insufficient documentation

## 2022-06-06 DIAGNOSIS — F419 Anxiety disorder, unspecified: Secondary | ICD-10-CM

## 2022-06-06 LAB — POCT URINE DRUG SCREEN - MANUAL ENTRY (I-SCREEN)
POC Amphetamine UR: NOT DETECTED
POC Buprenorphine (BUP): NOT DETECTED
POC Cocaine UR: NOT DETECTED
POC Marijuana UR: NOT DETECTED
POC Methadone UR: NOT DETECTED
POC Methamphetamine UR: NOT DETECTED
POC Morphine: NOT DETECTED
POC Oxazepam (BZO): POSITIVE — AB
POC Oxycodone UR: NOT DETECTED
POC Secobarbital (BAR): NOT DETECTED

## 2022-06-06 LAB — POCT PREGNANCY, URINE: Preg Test, Ur: NEGATIVE

## 2022-06-06 LAB — POC SARS CORONAVIRUS 2 AG: SARSCOV2ONAVIRUS 2 AG: NEGATIVE

## 2022-06-06 MED ORDER — LOPERAMIDE HCL 2 MG PO CAPS: 2.0000 mg | ORAL_CAPSULE | ORAL | Status: DC | PRN

## 2022-06-06 MED ORDER — ONDANSETRON 4 MG PO TBDP
4.0000 mg | ORAL_TABLET | Freq: Four times a day (QID) | ORAL | Status: DC | PRN
  Administered 2022-06-07: 4 mg via ORAL
  Filled 2022-06-06: qty 1

## 2022-06-06 MED ORDER — ADULT MULTIVITAMIN W/MINERALS CH
1.0000 | ORAL_TABLET | Freq: Every day | ORAL | Status: DC
  Administered 2022-06-06: 1 via ORAL
  Filled 2022-06-06: qty 1

## 2022-06-06 MED ORDER — LORAZEPAM 1 MG PO TABS: 1.0000 mg | ORAL_TABLET | Freq: Every day | ORAL | Status: DC

## 2022-06-06 MED ORDER — LORAZEPAM 1 MG PO TABS: 1.0000 mg | ORAL_TABLET | Freq: Two times a day (BID) | ORAL | Status: DC

## 2022-06-06 MED ORDER — LORAZEPAM 1 MG PO TABS
1.0000 mg | ORAL_TABLET | Freq: Four times a day (QID) | ORAL | Status: DC
  Administered 2022-06-06 – 2022-06-07 (×3): 1 mg via ORAL
  Filled 2022-06-06 (×3): qty 1

## 2022-06-06 MED ORDER — LORAZEPAM 1 MG PO TABS: 1.0000 mg | ORAL_TABLET | Freq: Three times a day (TID) | ORAL | Status: DC

## 2022-06-06 MED ORDER — LORAZEPAM 1 MG PO TABS: 1.0000 mg | ORAL_TABLET | Freq: Four times a day (QID) | ORAL | Status: DC | PRN

## 2022-06-06 MED ORDER — THIAMINE HCL 100 MG/ML IJ SOLN
100.0000 mg | Freq: Once | INTRAMUSCULAR | Status: AC
  Administered 2022-06-06: 100 mg via INTRAMUSCULAR
  Filled 2022-06-06: qty 2

## 2022-06-06 MED ORDER — HYDROXYZINE HCL 25 MG PO TABS
25.0000 mg | ORAL_TABLET | Freq: Four times a day (QID) | ORAL | Status: DC | PRN
  Administered 2022-06-07: 25 mg via ORAL
  Filled 2022-06-06: qty 1

## 2022-06-06 MED ORDER — THIAMINE HCL 100 MG PO TABS
100.0000 mg | ORAL_TABLET | Freq: Every day | ORAL | Status: DC
  Administered 2022-06-07: 100 mg via ORAL
  Filled 2022-06-06: qty 1

## 2022-06-06 MED ORDER — MAGNESIUM HYDROXIDE 400 MG/5ML PO SUSP: 30.0000 mL | Freq: Every day | ORAL | Status: DC | PRN

## 2022-06-06 MED ORDER — ALUM & MAG HYDROXIDE-SIMETH 200-200-20 MG/5ML PO SUSP: 30.0000 mL | ORAL | Status: DC | PRN

## 2022-06-06 MED ORDER — ACETAMINOPHEN 325 MG PO TABS
650.0000 mg | ORAL_TABLET | Freq: Four times a day (QID) | ORAL | Status: DC | PRN
  Administered 2022-06-06: 650 mg via ORAL
  Filled 2022-06-06: qty 2

## 2022-06-06 NOTE — ED Notes (Signed)
Pt complained of headache and requested for tylenol for pain.

## 2022-06-06 NOTE — Progress Notes (Signed)
   06/06/22 1800  [place] (Walk-ins at [facility] only)  What Is the Reason for Your Visit/Call Today? 46 year old female Sara [name] present to [facility] stating she's here for alcohol detox and medication management stating her medication is not working. Report she's having a lot a issues with stress, experiencing the empty nest symptom and martial problems. Report crying spills, loniness, and sadness. Patient denied suicidal/homicidal ideations and report experiencing auditory/visual hallucinations related to alcohol detox. Detox symptoms reported shaking, sweating, hallucinations. Report last drink earlier today around 3pm. Report has drank 2 160z beers. Patient appears under the influence with slurred speeched, instability when walking, and inability to stay awake. During Triage patient began experiencing active hallucinations stating she sees and hears her sister in the lobby. Patient is going to be IVC'd.  How Long Has This Been Causing You Problems? 1-6 months  Have You Recently Had Any Thoughts About Hurting Yourself? No  Are You Planning to Commit Suicide/Harm Yourself At This time? No  Have you Recently Had Thoughts About [place]? No  Are You Planning To Harm Someone At This Time? No  Are you currently experiencing any auditory, visual or other hallucinations? No  Have You Used Any Alcohol or Drugs in the Past 24 Hours? Yes  How long ago did you use Drugs or Alcohol? today 06/06/2022  What Did You Use and How Much? Patient report drinking two 16oz beers today. Report for the past 3 drinks she has been drinking alot.  Do you have any current medical co-morbidities that require immediate attention? No  Clinician description of patient physical appearance/behavior: Pt is dressed casually and appears intoxicated with slurred speeched, slowed responses, and glazed stir.  What Do You Feel Would Help You the Most Today? Treatment for Depression or other mood problem;Alcohol or Drug  Use Treatment  If access to [facility] was not available, would you have sought care in the Emergency Department? Yes  Determination of Need Urgent (48 hours)  Options For Referral Inpatient Hospitalization

## 2022-06-06 NOTE — ED Provider Notes (Incomplete)
[facility] Continuous Assessment Admission H&P  Date: 06/07/22 Patient Name: Arienne Gartin MRN: [id number] Chief Complaint:  Chief Complaint  Patient presents with   Alcohol Problem      Diagnoses:  Final diagnoses:  Alcohol abuse with intoxication ([place])  Behavior concern in adult  Anxious mood    HPI: Madison Hickman, 46 y.o female, with a known history of alcohol abuse, PTSD, anxiety, bipolar disorder and depression.  Presented to [facility], stating she is wanting to detox from alcohol.  According to patient she last drank alcohol today prior to coming in.  According to patient she drinks 3 beers and some wine today patient reported that she drinks every day for the past 3 weeks.  According to patient she was last sober for 8 months in 2016-03-02 prior to her dad's death.  Patient reports she has tried detox in the past her last detox program was in January 2023. Per the patient she is unemployed, patient lives with her husband. According to the patient she sees a psychiatrist here at [facility], according to patient she currently takes Lexapro, gabapentin, Topamax, Seroquel. Patient denies seeing a therapist at this moment.   Copied from TTS notes,  46 year old female Mylisa Kaucher present to [facility] stating she's here for alcohol detox and medication management stating her medication is not working. Report she's having a lot a issues with stress, experiencing the empty nest symptom and martial problems. Report crying spills, loniness, and sadness. Patient denied suicidal/homicidal ideations and report experiencing auditory/visual hallucinations related to alcohol detox. Detox symptoms reported shaking, sweating, hallucinations. Report last drink earlier today around 3pm. Report has drank 2 160z beers. Patient appears under the influence with slurred speeched, instability when walking, and inability to stay awake. During Triage patient began experiencing active hallucinations stating she sees and  hears her sister in the lobby.  Face to face observation of patient, patient was observed in the waiting room asleep, patient had to be awoken, patient is alert now and oriented x 4.  Speech is clear, mood is anxious, patient seemed to be a little bit intoxicated.  Affect congruent with mood.  Patient denies SI, HI, AVH, or paranoia.  Patient reports she smoked marijuana 1 week ago, reported that she drinks alcohol daily for the past 3 weeks, patient denies any other illicit drug use at this time.  Recommend inpatient observation,  for tonight and possible FBC as  a bed becomes available   PHQ 2-9:  Flowsheet Row Video Visit from 05/03/2022 in [facility] Video Visit from 11/09/2021 in [facility] ED from 10/06/2021 in [place] DEPT  Thoughts that you would be better off dead, or of hurting yourself in some way Several days Not at all Several days  PHQ-9 Total Score '17 16 15       '$ Flowsheet Row Video Visit from 05/03/2022 in [facility] Admission (Discharged) from 04/08/2022 in [place] [facility] ED from 04/07/2022 in [place] DEPT  C-SSRS RISK CATEGORY No Risk High Risk High Risk        Total Time spent with patient: 20 minutes  Musculoskeletal  Strength & Muscle Tone: within normal limits Gait & Station: normal Patient leans: N/A  Psychiatric Specialty Exam  Presentation General Appearance: Casual  Eye Contact:Fair  Speech:Clear and Coherent  Speech Volume:Decreased  Handedness:Right   Mood and Affect  Mood:Anxious  Affect:Congruent   Thought Process  Thought Processes:Linear  Descriptions of Associations:Circumstantial  Orientation:Full (Time, Place and Person)  Thought Content:WDL  Diagnosis of Schizophrenia or Schizoaffective disorder in past: No   Hallucinations:Hallucinations:  None  Ideas of Reference:None  Suicidal Thoughts:Suicidal Thoughts: No  Homicidal Thoughts:Homicidal Thoughts: No   Sensorium  Memory:Immediate Fair  Judgment:Poor  Insight:Poor   Executive Functions  Concentration:Poor  Attention Span:Fair  Post   Psychomotor Activity  Psychomotor Activity:Psychomotor Activity: Normal   Assets  Assets:Desire for Improvement   Sleep  Sleep:Sleep: Fair   Nutritional Assessment (For OBS and FBC admissions only) Has the patient had a weight loss or gain of 10 pounds or more in the last 3 months?: No Has the patient had a decrease in food intake/or appetite?: No Does the patient have dental problems?: No Does the patient have eating habits or behaviors that may be indicators of an eating disorder including binging or inducing vomiting?: No Has the patient recently lost weight without trying?: 0 Has the patient been eating poorly because of a decreased appetite?: 0 Malnutrition Screening Tool Score: 0    Physical Exam HENT:     Head: Normocephalic.     Nose: Nose normal.  Cardiovascular:     Rate and Rhythm: Normal rate.  Pulmonary:     Effort: Pulmonary effort is normal.  Musculoskeletal:        General: Normal range of motion.     Cervical back: Normal range of motion.  Skin:    General: Skin is warm.  Neurological:     General: No focal deficit present.     Mental Status: She is alert.  Psychiatric:        Mood and Affect: Mood normal.    Review of Systems  Constitutional: Negative.   HENT: Negative.    Eyes: Negative.   Respiratory: Negative.    Cardiovascular: Negative.   Gastrointestinal: Negative.   Genitourinary: Negative.   Musculoskeletal: Negative.   Skin: Negative.   Neurological: Negative.   Endo/Heme/Allergies: Negative.   Psychiatric/Behavioral:  Positive for substance abuse. The patient is nervous/anxious.     Pulse (!) 102, temperature 99  F (37.2 C), temperature source Oral, resp. rate 18, height '5\' 4"'$  (1.626 m), weight 155 lb (70.3 kg), SpO2 (!) 86 %. Body mass index is 26.61 kg/m.  Past Psychiatric History: PTSD, alcohol abuse, anxiety, bipolar disorder, depression.  Is the patient at risk to self? No  Has the patient been a risk to self in the past 6 months? No .    Has the patient been a risk to self within the distant past? No   Is the patient a risk to others? No   Has the patient been a risk to others in the past 6 months? No   Has the patient been a risk to others within the distant past? No   Past Medical History:  Past Medical History:  Diagnosis Date   Alcohol abuse    Alcoholism ([place])    Anemia    Anxiety    Blood transfusion without reported diagnosis    Cirrhosis ([place])    Depression    Esophageal varices with bleeding(456.0) 06/13/2014   GERD (gastroesophageal reflux disease)    Heart murmur    Patient states she may have   Menorrhagia    Pancytopenia ([place]) 01/15/2014   Pneumonia    Portal hypertension ([place])    S/P alcohol detoxification    2-3 days at behavioral health previously  UGI bleed 06/12/2014    Past Surgical History:  Procedure Laterality Date   CHOLECYSTECTOMY     ESOPHAGOGASTRODUODENOSCOPY N/A 06/12/2014   Procedure: ESOPHAGOGASTRODUODENOSCOPY (EGD);  Surgeon: Gatha Mayer, MD;  Location: [facility];  Service: Endoscopy;  Laterality: N/A;   ESOPHAGOGASTRODUODENOSCOPY (EGD) WITH PROPOFOL N/A 07/29/2014   Procedure: ESOPHAGOGASTRODUODENOSCOPY (EGD) WITH PROPOFOL;  Surgeon: Inda Castle, MD;  Location: [facility];  Service: Endoscopy;  Laterality: N/A;   ESOPHAGOGASTRODUODENOSCOPY (EGD) WITH PROPOFOL N/A 01/20/2018   Procedure: ESOPHAGOGASTRODUODENOSCOPY (EGD) WITH PROPOFOL;  Surgeon: Mauri Pole, MD;  Location: [facility];  Service: Endoscopy;  Laterality: N/A;    Family History:  Family History  Problem Relation Age of Onset   Colon polyps Mother     Hypertension Mother    Thyroid disease Mother    Alcoholism Mother    Alcoholism Father    Alcohol abuse Maternal Grandfather    Alcohol abuse Paternal Grandfather    Alcohol abuse Paternal Aunt    Alcohol abuse Maternal Uncle     Social History:  Social History   Socioeconomic History   Marital status: Married    Spouse name: [name]   Number of children: 2   Years of education: Associates   Highest education level: Not on file  Occupational History   Occupation: Paralegal  Tobacco Use   Smoking status: Former    Packs/day: 0.25    Types: Cigarettes    Quit date: 11/22/2018    Years since quitting: 3.5   Smokeless tobacco: Never  Substance and Sexual Activity   Alcohol use: Yes    Alcohol/week: 0.0 standard drinks of alcohol    Comment: Usually drinks 2-3 bottles of wine daily when drinking.    Drug use: No    Types: Cocaine, Marijuana    Comment: denies   Sexual activity: Never    Birth control/protection: None  Other Topics Concern   Not on file  Social History Narrative   Lives with husband and son. Daughter lives nearby. 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   Social Determinants of Health   Financial Resource Strain: Not on file  Food Insecurity: Not on file  Transportation Needs: Not on file  Physical Activity: Not on file  Stress: Not on file  Social Connections: Not on file  Intimate Partner Violence: Not on file    SDOH:  SDOH Screenings   Alcohol Screen: Medium Risk (04/08/2022)   Alcohol Screen    Last Alcohol Screening Score (AUDIT): 14  Depression (PHQ2-9): Medium Risk (05/03/2022)   Depression (PHQ2-9)    PHQ-2 Score: 17  Financial Resource Strain: Not on file  Food Insecurity: Not on file  Housing: Not on file  Physical Activity: Not on file  Social Connections: Not on file  Stress: Not on file  Tobacco Use: Medium Risk (05/06/2022)   Patient History    Smoking Tobacco Use: Former    Smokeless Tobacco Use: Never     Passive Exposure: Not on file  Transportation Needs: Not on file    Last Labs:  Admission on 06/06/2022  Component Date Value Ref Range Status   SARS Coronavirus 2 by RT PCR 06/06/2022 NEGATIVE  NEGATIVE Final   Comment: (NOTE) SARS-CoV-2 target nucleic acids are NOT DETECTED.  The SARS-CoV-2 RNA is generally detectable in upper respiratory specimens during the acute phase of infection. The lowest concentration of SARS-CoV-2 viral copies this assay can detect is 138 copies/mL. A negative result does not preclude SARS-Cov-2  infection and should not be used as the sole basis for treatment or other patient management decisions. A negative result may occur with  improper specimen collection/handling, submission of specimen other than nasopharyngeal swab, presence of viral mutation(s) within the areas targeted by this assay, and inadequate number of viral copies(<138 copies/mL). A negative result must be combined with clinical observations, patient history, and epidemiological information. The expected result is Negative.  Fact Sheet for Patients:  EntrepreneurPulse.com.au  Fact Sheet for Healthcare Providers:  IncredibleEmployment.be  This test is no                          t yet approved or cleared by the [place] FDA and  has been authorized for detection and/or diagnosis of SARS-CoV-2 by FDA under an Emergency Use Authorization (EUA). This EUA will remain  in effect (meaning this test can be used) for the duration of the COVID-19 declaration under Section 564(b)(1) of the Act, 21 U.S.C.section 360bbb-3(b)(1), unless the authorization is terminated  or revoked sooner.       Influenza A by PCR 06/06/2022 NEGATIVE  NEGATIVE Final   Influenza B by PCR 06/06/2022 NEGATIVE  NEGATIVE Final   Comment: (NOTE) The Xpert Xpress SARS-CoV-2/FLU/RSV plus assay is intended as an aid in the diagnosis of influenza from Nasopharyngeal swab  specimens and should not be used as a sole basis for treatment. Nasal washings and aspirates are unacceptable for Xpert Xpress SARS-CoV-2/FLU/RSV testing.  Fact Sheet for Patients: EntrepreneurPulse.com.au  Fact Sheet for Healthcare Providers: IncredibleEmployment.be  This test is not yet approved or cleared by the [place] FDA and has been authorized for detection and/or diagnosis of SARS-CoV-2 by FDA under an Emergency Use Authorization (EUA). This EUA will remain in effect (meaning this test can be used) for the duration of the COVID-19 declaration under Section 564(b)(1) of the Act, 21 U.S.C. section 360bbb-3(b)(1), unless the authorization is terminated or revoked.  Performed at [facility], [place] [address], [place], [place] [address]    WBC 06/06/2022 3.9 (L)  4.0 - 10.5 K/uL Final   RBC 06/06/2022 4.37  3.87 - 5.11 MIL/uL Final   Hemoglobin 06/06/2022 12.6  12.0 - 15.0 g/dL Final   HCT 06/06/2022 38.3  36.0 - 46.0 % Final   MCV 06/06/2022 87.6  80.0 - 100.0 fL Final   MCH 06/06/2022 28.8  26.0 - 34.0 pg Final   MCHC 06/06/2022 32.9  30.0 - 36.0 g/dL Final   RDW 06/06/2022 16.1 (H)  11.5 - 15.5 % Final   Platelets 06/06/2022 77 (L)  150 - 400 K/uL Final   Comment: Immature Platelet Fraction may be clinically indicated, consider ordering this additional test [id number] REPEATED TO VERIFY    nRBC 06/06/2022 0.0  0.0 - 0.2 % Final   Neutrophils Relative % 06/06/2022 46  % Final   Neutro Abs 06/06/2022 1.8  1.7 - 7.7 K/uL Final   Lymphocytes Relative 06/06/2022 47  % Final   Lymphs Abs 06/06/2022 1.8  0.7 - 4.0 K/uL Final   Monocytes Relative 06/06/2022 4  % Final   Monocytes Absolute 06/06/2022 0.2  0.1 - 1.0 K/uL Final   Eosinophils Relative 06/06/2022 2  % Final   Eosinophils Absolute 06/06/2022 0.1  0.0 - 0.5 K/uL Final   Basophils Relative 06/06/2022 1  % Final   Basophils Absolute 06/06/2022 0.0  0.0 - 0.1 K/uL Final    Immature Granulocytes 06/06/2022 0  %  Final   Abs Immature Granulocytes 06/06/2022 0.01  0.00 - 0.07 K/uL Final   Performed at [place] [address], [place], [place] [address]   Sodium 06/06/2022 142  135 - 145 mmol/L Final   Potassium 06/06/2022 3.5  3.5 - 5.1 mmol/L Final   Chloride 06/06/2022 109  98 - 111 mmol/L Final   CO2 06/06/2022 20 (L)  22 - 32 mmol/L Final   Glucose, Bld 06/06/2022 87  70 - 99 mg/dL Final   Glucose reference range applies only to samples taken after fasting for at least 8 hours.   BUN 06/06/2022 6  6 - 20 mg/dL Final   Creatinine, Ser 06/06/2022 0.61  0.44 - 1.00 mg/dL Final   Calcium 06/06/2022 8.5 (L)  8.9 - 10.3 mg/dL Final   Total Protein 06/06/2022 7.2  6.5 - 8.1 g/dL Final   Albumin 06/06/2022 3.5  3.5 - 5.0 g/dL Final   AST 06/06/2022 63 (H)  15 - 41 U/L Final   ALT 06/06/2022 26  0 - 44 U/L Final   Alkaline Phosphatase 06/06/2022 96  38 - 126 U/L Final   Total Bilirubin 06/06/2022 1.2  0.3 - 1.2 mg/dL Final   GFR, Estimated 06/06/2022 >60  >60 mL/min Final   Comment: (NOTE) Calculated using the CKD-EPI Creatinine Equation (2021)    Anion gap 06/06/2022 13  5 - 15 Final   Performed at [place] [address], [place], [place] [address]   Hgb A1c MFr Bld 06/06/2022 4.9  4.8 - 5.6 % Final   Comment: (NOTE) Pre diabetes:          5.7%-6.4%  Diabetes:              >6.4%  Glycemic control for   <7.0% adults with diabetes    Mean Plasma Glucose 06/06/2022 93.93  mg/dL Final   Performed at [facility], [place] [address], [place], [place] [address]   Alcohol, Ethyl (B) 06/06/2022 303 (HH)  <10 mg/dL Final   Comment: CRITICAL RESULT CALLED TO, READ BACK BY AND VERIFIED WITH G. MILLER, RN, 0120 06/07/22, A. RAMSEY (NOTE) Lowest detectable limit for serum alcohol is 10 mg/dL.  For medical purposes only. Performed at [facility], [place] [address] [place], [place] [address]    TSH 06/06/2022 1.143  0.350 - 4.500  uIU/mL Final   Comment: Performed by a 3rd Generation assay with a functional sensitivity of <=0.01 uIU/mL. Performed at [facility], [place] [address] [place], [place] [address]    POC Amphetamine UR 06/06/2022 None Detected  NONE DETECTED (Cut Off Level 1000 ng/mL) Final   POC Secobarbital (BAR) 06/06/2022 None Detected  NONE DETECTED (Cut Off Level 300 ng/mL) Final   POC Buprenorphine (BUP) 06/06/2022 None Detected  NONE DETECTED (Cut Off Level 10 ng/mL) Final   POC Oxazepam (BZO) 06/06/2022 Positive (A)  NONE DETECTED (Cut Off Level 300 ng/mL) Final   POC Cocaine UR 06/06/2022 None Detected  NONE DETECTED (Cut Off Level 300 ng/mL) Final   POC Methamphetamine UR 06/06/2022 None Detected  NONE DETECTED (Cut Off Level 1000 ng/mL) Final   POC Morphine 06/06/2022 None Detected  NONE DETECTED (Cut Off Level 300 ng/mL) Final   POC Methadone UR 06/06/2022 None Detected  NONE DETECTED (Cut Off Level 300 ng/mL) Final   POC Oxycodone UR 06/06/2022 None Detected  NONE DETECTED (Cut Off Level 100 ng/mL) Final   POC Marijuana UR 06/06/2022 None  Detected  NONE DETECTED (Cut Off Level 50 ng/mL) Final   SARSCOV2ONAVIRUS 2 AG 06/06/2022 NEGATIVE  NEGATIVE Final   Comment: (NOTE) SARS-CoV-2 antigen NOT DETECTED.   Negative results are presumptive.  Negative results do not preclude SARS-CoV-2 infection and should not be used as the sole basis for treatment or other patient management decisions, including infection  control decisions, particularly in the presence of clinical signs and  symptoms consistent with COVID-19, or in those who have been in contact with the virus.  Negative results must be combined with clinical observations, patient history, and epidemiological information. The expected result is Negative.  Fact Sheet for Patients: HandmadeRecipes.com.cy  Fact Sheet for Healthcare Providers: FuneralLife.at  This test is not yet approved or  cleared by the [place] FDA and  has been authorized for detection and/or diagnosis of SARS-CoV-2 by FDA under an Emergency Use Authorization (EUA).  This EUA will remain in effect (meaning this test can be used) for the duration of  the COV                          ID-19 declaration under Section 564(b)(1) of the Act, 21 U.S.C. section 360bbb-3(b)(1), unless the authorization is terminated or revoked sooner.     Preg Test, Ur 06/06/2022 NEGATIVE  NEGATIVE Final   Comment:        THE SENSITIVITY OF THIS METHODOLOGY IS >24 mIU/mL   Admission on 04/08/2022, Discharged on 04/14/2022  Component Date Value Ref Range Status   Sodium 04/10/2022 140  135 - 145 mmol/L Final   Potassium 04/10/2022 3.5  3.5 - 5.1 mmol/L Final   Chloride 04/10/2022 114 (H)  98 - 111 mmol/L Final   CO2 04/10/2022 20 (L)  22 - 32 mmol/L Final   Glucose, Bld 04/10/2022 116 (H)  70 - 99 mg/dL Final   Glucose reference range applies only to samples taken after fasting for at least 8 hours.   BUN 04/10/2022 10  6 - 20 mg/dL Final   Creatinine, Ser 04/10/2022 0.59  0.44 - 1.00 mg/dL Final   Calcium 04/10/2022 8.6 (L)  8.9 - 10.3 mg/dL Final   GFR, Estimated 04/10/2022 >60  >60 mL/min Final   Comment: (NOTE) Calculated using the CKD-EPI Creatinine Equation (2021)    Anion gap 04/10/2022 6  5 - 15 Final   Performed at [facility], [place] [address], [place], [place] [address]   Magnesium 04/10/2022 2.0  1.7 - 2.4 mg/dL Final   Performed at [place] [address] [place] [address], [place], [place] [address]   Ammonia 04/10/2022 75 (H)  9 - 35 umol/L Final   Performed at [facility], [place] [address], [place], [place] [address]   Cholesterol 04/10/2022 121  0 - 200 mg/dL Final   Triglycerides 04/10/2022 95  <150 mg/dL Final   HDL 04/10/2022 37 (L)  >40 mg/dL Final   Total CHOL/HDL Ratio 04/10/2022 3.3  RATIO Final   VLDL 04/10/2022 19  0 - 40 mg/dL Final   LDL  Cholesterol 04/10/2022 65  0 - 99 mg/dL Final   Comment:        Total Cholesterol/HDL:CHD Risk Coronary Heart Disease Risk Table                     Men   Women  1/2 Average Risk   3.4   3.3  Average Risk  5.0   4.4  2 X Average Risk   9.6   7.1  3 X Average Risk  23.4   11.0        Use the calculated Patient Ratio above and the CHD Risk Table to determine the patient's CHD Risk.        ATP III CLASSIFICATION (LDL):  <100     mg/dL   Optimal  100-129  mg/dL   Near or Above                    Optimal  130-159  mg/dL   Borderline  160-189  mg/dL   High  >190     mg/dL   Very High Performed at [place] [address] [place], [place] [address]    Hgb A1c MFr Bld 04/10/2022 4.9  4.8 - 5.6 % Final   Comment: (NOTE) Pre diabetes:          5.7%-6.4%  Diabetes:              >6.4%  Glycemic control for   <7.0% adults with diabetes    Mean Plasma Glucose 04/10/2022 93.93  mg/dL Final   Performed at [facility], [place] [address] [place], [place] [address]   Vit D, 25-Hydroxy 04/10/2022 32.72  30 - 100 ng/mL Final   Comment: (NOTE) Vitamin D deficiency has been defined by the [place] practice guideline as a level of serum 25-OH  vitamin D less than 20 ng/mL (1,2). The Endocrine Society went on to  further define vitamin D insufficiency as a level between 21 and 29  ng/mL (2).  1. IOM (Institute of Medicine). 2010. Dietary reference intakes for  calcium and D. [place]: The [employer]. 2. Holick MF, Binkley Duson, Bischoff-Ferrari HA, et al. Evaluation,  treatment, and prevention of vitamin D deficiency: an Endocrine  Society clinical practice guideline, JCEM. 2011 Jul; 96(7): 1911-30.  Performed at [facility], [place] [address] [place], [place] [address]    Vitamin B-12 04/10/2022 376  180 - 914 pg/mL Final   Comment: (NOTE) This assay is not validated for testing neonatal  or myeloproliferative syndrome specimens for Vitamin B12 levels. Performed at [facility], [place] [address], [place], [place] [address]    Hepatitis B Surface Ag 04/10/2022 NON REACTIVE  NON REACTIVE Final   HCV Ab 04/10/2022 NON REACTIVE  NON REACTIVE Final   Comment: (NOTE) Nonreactive HCV antibody screen is consistent with no HCV infections,  unless recent infection is suspected or other evidence exists to indicate HCV infection.     Hep A IgM 04/10/2022 NON REACTIVE  NON REACTIVE Final   Hep B C IgM 04/10/2022 NON REACTIVE  NON REACTIVE Final   Performed at [facility], [place] [address] [place], [place] [address]   WBC 04/13/2022 3.1 (L)  4.0 - 10.5 K/uL Final   RBC 04/13/2022 4.31  3.87 - 5.11 MIL/uL Final   Hemoglobin 04/13/2022 13.1  12.0 - 15.0 g/dL Final   HCT 04/13/2022 38.6  36.0 - 46.0 % Final   MCV 04/13/2022 89.6  80.0 - 100.0 fL Final   MCH 04/13/2022 30.4  26.0 - 34.0 pg Final   MCHC 04/13/2022 33.9  30.0 - 36.0 g/dL Final   RDW 04/13/2022 16.4 (H)  11.5 - 15.5 % Final   Platelets 04/13/2022 86 (L)  150 - 400 K/uL Final  Comment: Immature Platelet Fraction may be clinically indicated, consider ordering this additional test [id number]    nRBC 04/13/2022 0.0  0.0 - 0.2 % Final   Performed at [facility], [place] [address] [place], [place] [address]   Sodium 04/13/2022 139  135 - 145 mmol/L Final   Potassium 04/13/2022 3.4 (L)  3.5 - 5.1 mmol/L Final   Chloride 04/13/2022 115 (H)  98 - 111 mmol/L Final   CO2 04/13/2022 19 (L)  22 - 32 mmol/L Final   Glucose, Bld 04/13/2022 94  70 - 99 mg/dL Final   Glucose reference range applies only to samples taken after fasting for at least 8 hours.   BUN 04/13/2022 9  6 - 20 mg/dL Final   Creatinine, Ser 04/13/2022 0.56  0.44 - 1.00 mg/dL Final   Calcium 04/13/2022 8.9  8.9 - 10.3 mg/dL Final   Total Protein 04/13/2022 7.5  6.5 - 8.1 g/dL Final   Albumin 04/13/2022 3.5  3.5 - 5.0  g/dL Final   AST 04/13/2022 38  15 - 41 U/L Final   ALT 04/13/2022 24  0 - 44 U/L Final   Alkaline Phosphatase 04/13/2022 79  38 - 126 U/L Final   Total Bilirubin 04/13/2022 1.4 (H)  0.3 - 1.2 mg/dL Final   GFR, Estimated 04/13/2022 >60  >60 mL/min Final   Comment: (NOTE) Calculated using the CKD-EPI Creatinine Equation (2021)    Anion gap 04/13/2022 5  5 - 15 Final   Performed at [facility], [place] [address], [place], [place] [address]   Ammonia 04/13/2022 51 (H)  9 - 35 umol/L Final   Performed at [facility], [place] [address] [place], [place] [address]  Admission on 04/07/2022, Discharged on 04/07/2022  Component Date Value Ref Range Status   Sodium 04/07/2022 144  135 - 145 mmol/L Final   Potassium 04/07/2022 3.2 (L)  3.5 - 5.1 mmol/L Final   Chloride 04/07/2022 112 (H)  98 - 111 mmol/L Final   CO2 04/07/2022 21 (L)  22 - 32 mmol/L Final   Glucose, Bld 04/07/2022 107 (H)  70 - 99 mg/dL Final   Glucose reference range applies only to samples taken after fasting for at least 8 hours.   BUN 04/07/2022 7  6 - 20 mg/dL Final   Creatinine, Ser 04/07/2022 0.56  0.44 - 1.00 mg/dL Final   Calcium 04/07/2022 8.3 (L)  8.9 - 10.3 mg/dL Final   Total Protein 04/07/2022 7.5  6.5 - 8.1 g/dL Final   Albumin 04/07/2022 3.4 (L)  3.5 - 5.0 g/dL Final   AST 04/07/2022 37  15 - 41 U/L Final   ALT 04/07/2022 21  0 - 44 U/L Final   Alkaline Phosphatase 04/07/2022 74  38 - 126 U/L Final   Total Bilirubin 04/07/2022 0.9  0.3 - 1.2 mg/dL Final   GFR, Estimated 04/07/2022 >60  >60 mL/min Final   Comment: (NOTE) Calculated using the CKD-EPI Creatinine Equation (2021)    Anion gap 04/07/2022 11  5 - 15 Final   Performed at [facility], [place] [address], [place], [place] [address]   Alcohol, Ethyl (B) 04/07/2022 288 (H)  <10 mg/dL Final   Comment: (NOTE) Lowest detectable limit for serum alcohol is 10 mg/dL.  For medical purposes only. Performed  at [facility], [place] [address] [place], [place] [address]    Salicylate Lvl 33/54/5625 <7.0 (L)  7.0 - 30.0 mg/dL Final  Performed at [facility], [place] [address], [place], [place] [address]   Acetaminophen (Tylenol), Serum 04/07/2022 <10 (L)  10 - 30 ug/mL Final   Comment: (NOTE) Therapeutic concentrations vary significantly. A range of 10-30 ug/mL  may be an effective concentration for many patients. However, some  are best treated at concentrations outside of this range. Acetaminophen concentrations >150 ug/mL at 4 hours after ingestion  and >50 ug/mL at 12 hours after ingestion are often associated with  toxic reactions.  Performed at [facility], [place] [address], [place], [place] [address]    WBC 04/07/2022 3.2 (L)  4.0 - 10.5 K/uL Final   RBC 04/07/2022 4.13  3.87 - 5.11 MIL/uL Final   Hemoglobin 04/07/2022 12.7  12.0 - 15.0 g/dL Final   HCT 04/07/2022 36.7  36.0 - 46.0 % Final   MCV 04/07/2022 88.9  80.0 - 100.0 fL Final   MCH 04/07/2022 30.8  26.0 - 34.0 pg Final   MCHC 04/07/2022 34.6  30.0 - 36.0 g/dL Final   RDW 04/07/2022 16.5 (H)  11.5 - 15.5 % Final   Platelets 04/07/2022 77 (L)  150 - 400 K/uL Final   Comment: SPECIMEN CHECKED FOR CLOTS Immature Platelet Fraction may be clinically indicated, consider ordering this additional test [id number] PLATELET COUNT CONFIRMED BY SMEAR    nRBC 04/07/2022 0.0  0.0 - 0.2 % Final   Performed at [facility], [place] [address] [place], [place] [address]   Opiates 04/07/2022 NONE DETECTED  NONE DETECTED Final   Cocaine 04/07/2022 NONE DETECTED  NONE DETECTED Final   Benzodiazepines 04/07/2022 NONE DETECTED  NONE DETECTED Final   Amphetamines 04/07/2022 NONE DETECTED  NONE DETECTED Final   Tetrahydrocannabinol 04/07/2022 NONE DETECTED  NONE DETECTED Final   Barbiturates 04/07/2022 NONE DETECTED  NONE DETECTED Final   Comment: (NOTE) DRUG SCREEN FOR  MEDICAL PURPOSES ONLY.  IF CONFIRMATION IS NEEDED FOR ANY PURPOSE, NOTIFY LAB WITHIN 5 DAYS.  LOWEST DETECTABLE LIMITS FOR URINE DRUG SCREEN Drug Class                     Cutoff (ng/mL) Amphetamine and metabolites    1000 Barbiturate and metabolites    200 Benzodiazepine                 235 Tricyclics and metabolites     300 Opiates and metabolites        300 Cocaine and metabolites        300 THC                            50 Performed at [facility], [place] [address] [place], [place] [address]    I-stat hCG, quantitative 04/07/2022 <5.0  <5 mIU/mL Final   Comment 3 04/07/2022          Final   Comment:   GEST. AGE      CONC.  (mIU/mL)   <=1 WEEK        5 - 50     2 WEEKS       50 - 500     3 WEEKS       100 - 10,000     4 WEEKS     1,000 - 30,000        FEMALE AND NON-PREGNANT FEMALE:     LESS THAN 5 mIU/mL    SARS Coronavirus 2 by  RT PCR 04/07/2022 NEGATIVE  NEGATIVE Final   Comment: (NOTE) SARS-CoV-2 target nucleic acids are NOT DETECTED.  The SARS-CoV-2 RNA is generally detectable in upper respiratory specimens during the acute phase of infection. The lowest concentration of SARS-CoV-2 viral copies this assay can detect is 138 copies/mL. A negative result does not preclude SARS-Cov-2 infection and should not be used as the sole basis for treatment or other patient management decisions. A negative result may occur with  improper specimen collection/handling, submission of specimen other than nasopharyngeal swab, presence of viral mutation(s) within the areas targeted by this assay, and inadequate number of viral copies(<138 copies/mL). A negative result must be combined with clinical observations, patient history, and epidemiological information. The expected result is Negative.  Fact Sheet for Patients:  EntrepreneurPulse.com.au  Fact Sheet for Healthcare Providers:  IncredibleEmployment.be  This test is no                           t yet approved or cleared by the [place] FDA and  has been authorized for detection and/or diagnosis of SARS-CoV-2 by FDA under an Emergency Use Authorization (EUA). This EUA will remain  in effect (meaning this test can be used) for the duration of the COVID-19 declaration under Section 564(b)(1) of the Act, 21 U.S.C.section 360bbb-3(b)(1), unless the authorization is terminated  or revoked sooner.       Influenza A by PCR 04/07/2022 NEGATIVE  NEGATIVE Final   Influenza B by PCR 04/07/2022 NEGATIVE  NEGATIVE Final   Comment: (NOTE) The Xpert Xpress SARS-CoV-2/FLU/RSV plus assay is intended as an aid in the diagnosis of influenza from Nasopharyngeal swab specimens and should not be used as a sole basis for treatment. Nasal washings and aspirates are unacceptable for Xpert Xpress SARS-CoV-2/FLU/RSV testing.  Fact Sheet for Patients: EntrepreneurPulse.com.au  Fact Sheet for Healthcare Providers: IncredibleEmployment.be  This test is not yet approved or cleared by the [place] FDA and has been authorized for detection and/or diagnosis of SARS-CoV-2 by FDA under an Emergency Use Authorization (EUA). This EUA will remain in effect (meaning this test can be used) for the duration of the COVID-19 declaration under Section 564(b)(1) of the Act, 21 U.S.C. section 360bbb-3(b)(1), unless the authorization is terminated or revoked.  Performed at [facility], [place] [address], [place], [place] [address]   Admission on 01/24/2022, Discharged on 01/31/2022  Component Date Value Ref Range Status   SARS Coronavirus 2 by RT PCR 01/24/2022 NEGATIVE  NEGATIVE Final   Comment: (NOTE) SARS-CoV-2 target nucleic acids are NOT DETECTED.  The SARS-CoV-2 RNA is generally detectable in upper respiratory specimens during the acute phase of infection. The lowest concentration of SARS-CoV-2 viral copies this assay  can detect is 138 copies/mL. A negative result does not preclude SARS-Cov-2 infection and should not be used as the sole basis for treatment or other patient management decisions. A negative result may occur with  improper specimen collection/handling, submission of specimen other than nasopharyngeal swab, presence of viral mutation(s) within the areas targeted by this assay, and inadequate number of viral copies(<138 copies/mL). A negative result must be combined with clinical observations, patient history, and epidemiological information. The expected result is Negative.  Fact Sheet for Patients:  EntrepreneurPulse.com.au  Fact Sheet for Healthcare Providers:  IncredibleEmployment.be  This test is no  t yet approved or cleared by the [place] and  has been authorized for detection and/or diagnosis of SARS-CoV-2 by FDA under an Emergency Use Authorization (EUA). This EUA will remain  in effect (meaning this test can be used) for the duration of the COVID-19 declaration under Section 564(b)(1) of the Act, 21 U.S.C.section 360bbb-3(b)(1), unless the authorization is terminated  or revoked sooner.       Influenza A by PCR 01/24/2022 NEGATIVE  NEGATIVE Final   Influenza B by PCR 01/24/2022 NEGATIVE  NEGATIVE Final   Comment: (NOTE) The Xpert Xpress SARS-CoV-2/FLU/RSV plus assay is intended as an aid in the diagnosis of influenza from Nasopharyngeal swab specimens and should not be used as a sole basis for treatment. Nasal washings and aspirates are unacceptable for Xpert Xpress SARS-CoV-2/FLU/RSV testing.  Fact Sheet for Patients: EntrepreneurPulse.com.au  Fact Sheet for Healthcare Providers: IncredibleEmployment.be  This test is not yet approved or cleared by the [place] FDA and has been authorized for detection and/or diagnosis of SARS-CoV-2 by FDA under an  Emergency Use Authorization (EUA). This EUA will remain in effect (meaning this test can be used) for the duration of the COVID-19 declaration under Section 564(b)(1) of the Act, 21 U.S.C. section 360bbb-3(b)(1), unless the authorization is terminated or revoked.  Performed at [facility], [place] [address], [name], [place] [address]    SARS Coronavirus 2 Ag 01/24/2022 Negative  Negative Preliminary   WBC 01/24/2022 4.0  4.0 - 10.5 K/uL Final   RBC 01/24/2022 3.79 (L)  3.87 - 5.11 MIL/uL Final   Hemoglobin 01/24/2022 11.4 (L)  12.0 - 15.0 g/dL Final   HCT 01/24/2022 34.4 (L)  36.0 - 46.0 % Final   MCV 01/24/2022 90.8  80.0 - 100.0 fL Final   MCH 01/24/2022 30.1  26.0 - 34.0 pg Final   MCHC 01/24/2022 33.1  30.0 - 36.0 g/dL Final   RDW 01/24/2022 16.0 (H)  11.5 - 15.5 % Final   Platelets 01/24/2022 99 (L)  150 - 400 K/uL Final   Comment: Immature Platelet Fraction may be clinically indicated, consider ordering this additional test [id number] CONSISTENT WITH PREVIOUS RESULT REPEATED TO VERIFY    nRBC 01/24/2022 0.0  0.0 - 0.2 % Final   Neutrophils Relative % 01/24/2022 49  % Final   Neutro Abs 01/24/2022 2.0  1.7 - 7.7 K/uL Final   Lymphocytes Relative 01/24/2022 40  % Final   Lymphs Abs 01/24/2022 1.6  0.7 - 4.0 K/uL Final   Monocytes Relative 01/24/2022 9  % Final   Monocytes Absolute 01/24/2022 0.4  0.1 - 1.0 K/uL Final   Eosinophils Relative 01/24/2022 1  % Final   Eosinophils Absolute 01/24/2022 0.1  0.0 - 0.5 K/uL Final   Basophils Relative 01/24/2022 1  % Final   Basophils Absolute 01/24/2022 0.0  0.0 - 0.1 K/uL Final   Immature Granulocytes 01/24/2022 0  % Final   Abs Immature Granulocytes 01/24/2022 0.01  0.00 - 0.07 K/uL Final   Performed at [facility], [place] [address], [place], [place] [address]   Sodium 01/24/2022 136  135 - 145 mmol/L Final   Potassium 01/24/2022 2.8 (L)  3.5 - 5.1 mmol/L Final   Chloride 01/24/2022 109  98 - 111 mmol/L Final   CO2  01/24/2022 17 (L)  22 - 32 mmol/L Final   Glucose, Bld 01/24/2022 90  70 - 99 mg/dL Final   Glucose reference range applies only to samples taken after fasting  for at least 8 hours.   BUN 01/24/2022 6  6 - 20 mg/dL Final   Creatinine, Ser 01/24/2022 0.68  0.44 - 1.00 mg/dL Final   Calcium 01/24/2022 8.2 (L)  8.9 - 10.3 mg/dL Final   Total Protein 01/24/2022 7.0  6.5 - 8.1 g/dL Final   Albumin 01/24/2022 3.3 (L)  3.5 - 5.0 g/dL Final   AST 01/24/2022 36  15 - 41 U/L Final   ALT 01/24/2022 22  0 - 44 U/L Final   Alkaline Phosphatase 01/24/2022 77  38 - 126 U/L Final   Total Bilirubin 01/24/2022 0.9  0.3 - 1.2 mg/dL Final   GFR, Estimated 01/24/2022 >60  >60 mL/min Final   Comment: (NOTE) Calculated using the CKD-EPI Creatinine Equation (2021)    Anion gap 01/24/2022 10  5 - 15 Final   Performed at [facility], [place] [address], [place], [place] [address]   Hgb A1c MFr Bld 01/24/2022 4.7 (L)  4.8 - 5.6 % Final   Comment: (NOTE) Pre diabetes:          5.7%-6.4%  Diabetes:              >6.4%  Glycemic control for   <7.0% adults with diabetes    Mean Plasma Glucose 01/24/2022 88.19  mg/dL Final   Performed at [facility], [place] [address], [place], [place] [address]   Alcohol, Ethyl (B) 01/24/2022 201 (H)  <10 mg/dL Final   Comment: (NOTE) Lowest detectable limit for serum alcohol is 10 mg/dL.  For medical purposes only. Performed at [facility], [place] [address], [place], [place] [address]    TSH 01/24/2022 3.012  0.350 - 4.500 uIU/mL Final   Comment: Performed by a 3rd Generation assay with a functional sensitivity of <=0.01 uIU/mL. Performed at [facility], [place] [address], [place], [place] [address]    Color, Urine 01/24/2022 YELLOW  YELLOW Final   APPearance 01/24/2022 CLEAR  CLEAR Final   Specific Gravity, Urine 01/24/2022 1.006  1.005 - 1.030 Final   pH 01/24/2022 7.0  5.0 - 8.0 Final   Glucose, UA 01/24/2022 NEGATIVE  NEGATIVE mg/dL Final   Hgb  urine dipstick 01/24/2022 NEGATIVE  NEGATIVE Final   Bilirubin Urine 01/24/2022 NEGATIVE  NEGATIVE Final   Ketones, ur 01/24/2022 NEGATIVE  NEGATIVE mg/dL Final   Protein, ur 01/24/2022 NEGATIVE  NEGATIVE mg/dL Final   Nitrite 01/24/2022 NEGATIVE  NEGATIVE Final   Leukocytes,Ua 01/24/2022 NEGATIVE  NEGATIVE Final   Performed at [facility], [place] [address] [place],  [address]   Preg Test, Ur 01/24/2022 NEGATIVE  NEGATIVE Final   Comment:        THE SENSITIVITY OF THIS METHODOLOGY IS >20 mIU/mL. Performed at [facility], [place] [address] [place], [place] [address]    POC Amphetamine UR 01/24/2022 None Detected  NONE DETECTED (Cut Off Level 1000 ng/mL) Final   POC Secobarbital (BAR) 01/24/2022 None Detected  NONE DETECTED (Cut Off Level 300 ng/mL) Final   POC Buprenorphine (BUP) 01/24/2022 None Detected  NONE DETECTED (Cut Off Level 10 ng/mL) Final   POC Oxazepam (BZO) 01/24/2022 Positive (A)  NONE DETECTED (Cut Off Level 300 ng/mL) Final   POC Cocaine UR 01/24/2022 None Detected  NONE DETECTED (Cut Off Level 300 ng/mL) Final   POC Methamphetamine UR 01/24/2022 None Detected  NONE DETECTED (Cut Off Level 1000 ng/mL) Final   POC Morphine 01/24/2022 None Detected  NONE DETECTED (  Cut Off Level 300 ng/mL) Final   POC Oxycodone UR 01/24/2022 None Detected  NONE DETECTED (Cut Off Level 100 ng/mL) Final   POC Methadone UR 01/24/2022 None Detected  NONE DETECTED (Cut Off Level 300 ng/mL) Final   POC Marijuana UR 01/24/2022 None Detected  NONE DETECTED (Cut Off Level 50 ng/mL) Final   Preg Test, Ur 01/24/2022 NEGATIVE  NEGATIVE Final   Comment:        THE SENSITIVITY OF THIS METHODOLOGY IS >24 mIU/mL    SARSCOV2ONAVIRUS 2 AG 01/24/2022 NEGATIVE  NEGATIVE Final   Comment: (NOTE) SARS-CoV-2 antigen NOT DETECTED.   Negative results are presumptive.  Negative results do not preclude SARS-CoV-2 infection and should not be used as the sole basis for treatment or other patient  management decisions, including infection  control decisions, particularly in the presence of clinical signs and  symptoms consistent with COVID-19, or in those who have been in contact with the virus.  Negative results must be combined with clinical observations, patient history, and epidemiological information. The expected result is Negative.  Fact Sheet for Patients: HandmadeRecipes.com.cy  Fact Sheet for Healthcare Providers: FuneralLife.at  This test is not yet approved or cleared by the [place] FDA and  has been authorized for detection and/or diagnosis of SARS-CoV-2 by FDA under an Emergency Use Authorization (EUA).  This EUA will remain in effect (meaning this test can be used) for the duration of  the COV                          ID-19 declaration under Section 564(b)(1) of the Act, 21 U.S.C. section 360bbb-3(b)(1), unless the authorization is terminated or revoked sooner.     Cholesterol 01/24/2022 137  0 - 200 mg/dL Final   Triglycerides 01/24/2022 189 (H)  <150 mg/dL Final   HDL 01/24/2022 32 (L)  >40 mg/dL Final   Total CHOL/HDL Ratio 01/24/2022 4.3  RATIO Final   VLDL 01/24/2022 38  0 - 40 mg/dL Final   LDL Cholesterol 01/24/2022 67  0 - 99 mg/dL Final   Comment:        Total Cholesterol/HDL:CHD Risk Coronary Heart Disease Risk Table                     Men   Women  1/2 Average Risk   3.4   3.3  Average Risk       5.0   4.4  2 X Average Risk   9.6   7.1  3 X Average Risk  23.4   11.0        Use the calculated Patient Ratio above and the CHD Risk Table to determine the patient's CHD Risk.        ATP III CLASSIFICATION (LDL):  <100     mg/dL   Optimal  100-129  mg/dL   Near or Above                    Optimal  130-159  mg/dL   Borderline  160-189  mg/dL   High  >190     mg/dL   Very High Performed at [place] [address] [place], [place] [address]    Sodium 01/25/2022 139  135 - 145  mmol/L Final   Potassium 01/25/2022 4.0  3.5 - 5.1 mmol/L Final   NO VISIBLE HEMOLYSIS   Chloride 01/25/2022 114 (H)  98 - 111 mmol/L Final  CO2 01/25/2022 19 (L)  22 - 32 mmol/L Final   Glucose, Bld 01/25/2022 83  70 - 99 mg/dL Final   Glucose reference range applies only to samples taken after fasting for at least 8 hours.   BUN 01/25/2022 7  6 - 20 mg/dL Final   Creatinine, Ser 01/25/2022 0.53  0.44 - 1.00 mg/dL Final   Calcium 01/25/2022 8.4 (L)  8.9 - 10.3 mg/dL Final   Total Protein 01/25/2022 6.7  6.5 - 8.1 g/dL Final   Albumin 01/25/2022 3.1 (L)  3.5 - 5.0 g/dL Final   AST 01/25/2022 33  15 - 41 U/L Final   ALT 01/25/2022 19  0 - 44 U/L Final   Alkaline Phosphatase 01/25/2022 70  38 - 126 U/L Final   Total Bilirubin 01/25/2022 0.8  0.3 - 1.2 mg/dL Final   GFR, Estimated 01/25/2022 >60  >60 mL/min Final   Comment: (NOTE) Calculated using the CKD-EPI Creatinine Equation (2021)    Anion gap 01/25/2022 6  5 - 15 Final   Performed at [facility] [address], [place], [place] [address]    Allergies: Morphine and related and Nsaids  PTA Medications: (Not in a hospital admission)   Medical Decision Making  Inpatient observation   Meds ordered this encounter  Medications   acetaminophen (TYLENOL) tablet 650 mg   alum & mag hydroxide-simeth (MAALOX/MYLANTA) 200-200-20 MG/5ML suspension 30 mL   magnesium hydroxide (MILK OF MAGNESIA) suspension 30 mL   thiamine (B-1) injection 100 mg   thiamine tablet 100 mg   multivitamin with minerals tablet 1 tablet   LORazepam (ATIVAN) tablet 1 mg   hydrOXYzine (ATARAX) tablet 25 mg   loperamide (IMODIUM) capsule 2-4 mg   ondansetron (ZOFRAN-ODT) disintegrating tablet 4 mg   FOLLOWED BY Linked Order Group    LORazepam (ATIVAN) tablet 1 mg    LORazepam (ATIVAN) tablet 1 mg    LORazepam (ATIVAN) tablet 1 mg    LORazepam (ATIVAN) tablet 1 mg    Lab Orders         Resp Panel by RT-PCR (Flu A&B, Covid) Anterior Nasal Swab          CBC with Differential/Platelet         Comprehensive metabolic panel         Hemoglobin A1c         Ethanol         TSH         Pregnancy, urine         POCT Urine Drug Screen - (I-Screen)         POC SARS Coronavirus 2 Ag         Pregnancy, urine POC      Recommendations  Based on my evaluation the patient does not appear to have an emergency medical condition.  Evette Georges, NP 06/07/22  5:29 AM

## 2022-06-07 ENCOUNTER — Emergency Department (HOSPITAL_COMMUNITY)
Admission: EM | Admit: 2022-06-07 | Discharge: 2022-06-07 | Disposition: A | Discharge status: Home / Self Care | Payer: Medicaid Other | Attending: Emergency Medicine | Admitting: Emergency Medicine

## 2022-06-07 DIAGNOSIS — F1020 Alcohol dependence, uncomplicated: Secondary | ICD-10-CM | POA: Insufficient documentation

## 2022-06-07 LAB — CBC WITH DIFFERENTIAL/PLATELET
Abs Immature Granulocytes: 0.01 10*3/uL (ref 0.00–0.07)
Basophils Absolute: 0 10*3/uL (ref 0.0–0.1)
Basophils Relative: 1 %
Eosinophils Absolute: 0.1 10*3/uL (ref 0.0–0.5)
Eosinophils Relative: 2 %
HCT: 38.3 % (ref 36.0–46.0)
Hemoglobin: 12.6 g/dL (ref 12.0–15.0)
Immature Granulocytes: 0 %
Lymphocytes Relative: 47 %
Lymphs Abs: 1.8 10*3/uL (ref 0.7–4.0)
MCH: 28.8 pg (ref 26.0–34.0)
MCHC: 32.9 g/dL (ref 30.0–36.0)
MCV: 87.6 fL (ref 80.0–100.0)
Monocytes Absolute: 0.2 10*3/uL (ref 0.1–1.0)
Monocytes Relative: 4 %
Neutro Abs: 1.8 10*3/uL (ref 1.7–7.7)
Neutrophils Relative %: 46 %
Platelets: 77 10*3/uL — ABNORMAL LOW (ref 150–400)
RBC: 4.37 MIL/uL (ref 3.87–5.11)
RDW: 16.1 % — ABNORMAL HIGH (ref 11.5–15.5)
WBC: 3.9 10*3/uL — ABNORMAL LOW (ref 4.0–10.5)
nRBC: 0 % (ref 0.0–0.2)

## 2022-06-07 LAB — RESP PANEL BY RT-PCR (FLU A&B, COVID) ARPGX2
Influenza A by PCR: NEGATIVE
Influenza B by PCR: NEGATIVE
SARS Coronavirus 2 by RT PCR: NEGATIVE

## 2022-06-07 LAB — COMPREHENSIVE METABOLIC PANEL
ALT: 26 U/L (ref 0–44)
AST: 63 U/L — ABNORMAL HIGH (ref 15–41)
Albumin: 3.5 g/dL (ref 3.5–5.0)
Alkaline Phosphatase: 96 U/L (ref 38–126)
Anion gap: 13 (ref 5–15)
BUN: 6 mg/dL (ref 6–20)
CO2: 20 mmol/L — ABNORMAL LOW (ref 22–32)
Calcium: 8.5 mg/dL — ABNORMAL LOW (ref 8.9–10.3)
Chloride: 109 mmol/L (ref 98–111)
Creatinine, Ser: 0.61 mg/dL (ref 0.44–1.00)
GFR, Estimated: >60 mL/min (ref >60)
Glucose, Bld: 87 mg/dL (ref 70–99)
Potassium: 3.5 mmol/L (ref 3.5–5.1)
Sodium: 142 mmol/L (ref 135–145)
Total Bilirubin: 1.2 mg/dL (ref 0.3–1.2)
Total Protein: 7.2 g/dL (ref 6.5–8.1)

## 2022-06-07 LAB — TSH: TSH: 1.143 u[IU]/mL (ref 0.350–4.500)

## 2022-06-07 LAB — HEMOGLOBIN A1C
Hgb A1c MFr Bld: 4.9 % (ref 4.8–5.6)
Mean Plasma Glucose: 93.93 mg/dL

## 2022-06-07 LAB — ETHANOL: Alcohol, Ethyl (B): 303 mg/dL (ref <10)

## 2022-06-07 MED ORDER — BENZOCAINE 10 % MT GEL: Freq: Three times a day (TID) | OROMUCOSAL | Status: DC | PRN

## 2022-06-07 MED ORDER — ESCITALOPRAM OXALATE 10 MG PO TABS
20.0000 mg | ORAL_TABLET | Freq: Every day | ORAL | Status: DC
  Administered 2022-06-07: 20 mg via ORAL
  Filled 2022-06-07: qty 2

## 2022-06-07 MED ORDER — CHLORDIAZEPOXIDE HCL 25 MG PO CAPS: ORAL_CAPSULE | ORAL | 0 refills | Status: DC

## 2022-06-07 MED ORDER — GABAPENTIN 400 MG PO CAPS
400.0000 mg | ORAL_CAPSULE | Freq: Three times a day (TID) | ORAL | Status: DC
  Administered 2022-06-07: 400 mg via ORAL
  Filled 2022-06-07: qty 1

## 2022-06-07 MED ORDER — HYDROXYZINE HCL 25 MG PO TABS: 50.0000 mg | ORAL_TABLET | Freq: Three times a day (TID) | ORAL | Status: DC | PRN

## 2022-06-07 MED ORDER — RAMELTEON 8 MG PO TABS: 8.0000 mg | ORAL_TABLET | Freq: Every day | ORAL | Status: DC

## 2022-06-07 MED ORDER — TOPIRAMATE 100 MG PO TABS
100.0000 mg | ORAL_TABLET | Freq: Two times a day (BID) | ORAL | Status: DC
  Administered 2022-06-07: 100 mg via ORAL
  Filled 2022-06-07: qty 1

## 2022-06-07 MED ORDER — PRAZOSIN HCL 1 MG PO CAPS: 1.0000 mg | ORAL_CAPSULE | Freq: Every day | ORAL | Status: DC

## 2022-06-07 MED ORDER — QUETIAPINE FUMARATE 100 MG PO TABS: 100.0000 mg | ORAL_TABLET | Freq: Every day | ORAL | Status: DC

## 2022-06-07 MED ORDER — LORAZEPAM 1 MG PO TABS
1.0000 mg | ORAL_TABLET | Freq: Once | ORAL | Status: AC
  Administered 2022-06-07: 1 mg via ORAL
  Filled 2022-06-07: qty 1

## 2022-06-07 MED ORDER — ADULT MULTIVITAMIN W/MINERALS CH
1.0000 | ORAL_TABLET | Freq: Every day | ORAL | Status: DC
  Administered 2022-06-07: 1 via ORAL
  Filled 2022-06-07: qty 1

## 2022-06-07 NOTE — Discharge Instructions (Addendum)
As we discussed, I have given you a medication to help with your withdrawals at home. Please take this medication as prescribed in its entirety for management of your symptoms and follow-up with behavioral health as needed for continued evaluation and management of your symptoms.  Return if development of any new or worsening symptoms.

## 2022-06-07 NOTE — ED Notes (Signed)
Pt is in the bed sleeping. Respirations are even and unlabored. No acute distress noted. Will continue to monitor for safety. 

## 2022-06-07 NOTE — Progress Notes (Signed)
Lab called with critical blood alcohol value of 303. Provider notified.

## 2022-06-07 NOTE — ED Notes (Signed)
Pt asleep at this hour. No apparent distress. RR even and unlabored. Monitored for safety.

## 2022-06-07 NOTE — ED Notes (Signed)
Pt currently resting quietly.  Pt is breathing even and unlabored if view of nursing station. No distress noted at this time.  Will monitor for safety.

## 2022-06-07 NOTE — ED Notes (Addendum)
Pt admitted to [facility], ETOH detox and medication management. During skin  assessment pt had medication in her socks and another medication fell from her clothes. Another nurse witnessed this RN disposal of medication. Skin assessment complete. Belongings in the locker. Patient oriented to unit and unit rules. Meal and drinks offered to patient.  Patient verbalized agreement to treatment plans. Patient verbally contracts for safety while hospitalized. Will monitor for safety.

## 2022-06-07 NOTE — ED Notes (Signed)
Patient Alert and oriented to baseline. Stable and ambulatory to baseline. Patient verbalized understanding of the discharge instructions.  Patient belongings were taken by the patient.   

## 2022-06-07 NOTE — ED Provider Notes (Signed)
[place] EMERGENCY DEPARTMENT Provider Note   CSN: [id number] Arrival date & time: 06/07/22  1408     History  No chief complaint on file.   Sara [name] is a 46 y.o. female.  Patient with history of alcohol abuse presents today requesting assistance with detox. She states that she originally went to [facility] for same and was sent here because they were uncomfortable managing her symptoms here. Patient states that her last drink was at 5pm yesterday and that she has been drinking around 10 beers every day for the past 3 weeks. She denies any complaints at this time. Denies fevers, chills, tremors, headache, chest pain, shortness of breath, nausea, vomiting, or diarrhea. Also denies SI/HI or AVH.   The history is provided by the patient. No language interpreter was used.       Home Medications Prior to Admission medications   Medication Sig Start Date End Date Taking? Authorizing Provider  benzocaine (ORAJEL) 10 % mucosal gel Use as directed in the mouth or throat 3 (three) times daily as needed for mouth pain. Patient taking differently: Use as directed 1 Application in the mouth or throat 3 (three) times daily as needed for mouth pain. 04/14/22   Massengill, Ovid Curd, MD  escitalopram (LEXAPRO) 20 MG tablet Take 1 tablet (20 mg total) by mouth once daily. 05/03/22   Nwoko, Terese Door, PA  gabapentin (NEURONTIN) 400 MG capsule Take 1 capsule (400 mg total) by mouth 3 (three) times daily. 05/03/22   Nwoko, Terese Door, PA  hydrOXYzine (ATARAX) 50 MG tablet Take 1 tablet (50 mg total) by mouth 3 (three) times daily as needed for anxiety. 05/03/22   Nwoko, Terese Door, PA  Multiple Vitamin (MULTIVITAMIN WITH MINERALS) TABS tablet Take 1 tablet by mouth daily.    [provider]  prazosin (MINIPRESS) 1 MG capsule Take 1 capsule (1 mg total) by mouth once nightly at bedtime. 05/03/22   Nwoko, Terese Door, PA  QUEtiapine (SEROQUEL) 100 MG tablet Take 1 tablet (100 mg  total) by mouth once nightly at bedtime. 05/03/22   Nwoko, Terese Door, PA  ramelteon (ROZEREM) 8 MG tablet Take 1 tablet (8 mg total) by mouth once nightly at bedtime. 05/03/22   Nwoko, Terese Door, PA  topiramate (TOPAMAX) 100 MG tablet Take 1 tablet (100 mg total) by mouth 2 (two) times daily. 05/03/22   Nwoko, Terese Door, PA  pantoprazole (PROTONIX) 40 MG tablet Take 1 tablet (40 mg total) by mouth daily. 09/14/20 01/21/21  Fulp, Ander Gaster, MD      Allergies    Gluten meal, Morphine and related, and Nsaids    Review of Systems   Review of Systems  All other systems reviewed and are negative.   Physical Exam Updated Vital Signs BP 104/68 (BP Location: Left Arm)   Pulse 81   Temp 98.6 F (37 C) (Oral)   Resp 16   SpO2 90%  Physical Exam Vitals and nursing note reviewed.  Constitutional:      General: She is not in acute distress.    Appearance: Normal appearance. She is normal weight. She is not ill-appearing, toxic-appearing or diaphoretic.  HENT:     Head: Normocephalic and atraumatic.  Eyes:     Extraocular Movements: Extraocular movements intact.     Pupils: Pupils are equal, round, and reactive to light.  Cardiovascular:     Rate and Rhythm: Normal rate.  Pulmonary:     Effort: Pulmonary effort is normal. No respiratory  distress.  Abdominal:     General: Abdomen is flat.     Palpations: Abdomen is soft.  Musculoskeletal:        General: Normal range of motion.     Cervical back: Normal range of motion.     Comments: No tremors  Skin:    General: Skin is warm and dry.  Neurological:     General: No focal deficit present.     Mental Status: She is alert and oriented to person, place, and time. Mental status is at baseline.     Gait: Gait normal.  Psychiatric:        Mood and Affect: Mood normal.        Behavior: Behavior normal.        Thought Content: Thought content normal.        Judgment: Judgment normal.     Comments: Does not appear to be responding to internal  stimuli     ED Results / Procedures / Treatments   Labs (all labs ordered are listed, but only abnormal results are displayed) Labs Reviewed - No data to display  EKG None  Radiology No results found.  Procedures Procedures    Medications Ordered in ED Medications  LORazepam (ATIVAN) tablet 1 mg (1 mg Oral Given 06/07/22 1639)    ED Course/ Medical Decision Making/ A&P                           Medical Decision Making Risk Prescription drug management.   Patient presents today requesting detox from alcohol. She states her last drink was yesterday at 5pm. She is afebrile, non-toxic appearing, and in no acute distress with reassuring vital signs. She is also currently alert and oriented and neurologically intact. CIWA 0. Therefore no concerns for DTs or seizures or any other emergent conditions related to alcohol withdrawal at this time. Given Ativan today for management. She is stable for discharge and does not require labs or imaging at this time. Denies SI/HI or AVH. Given Librium taper to assist with withdrawal at this time. Educated that we do not admit people for detox. Educated to return to [facility] for further evaluation as needed. Educated on red flag symptoms that would prompt immediate return. She is understanding and amenable with plan. Discharged in stable condition.   Final Clinical Impression(s) / ED Diagnoses Final diagnoses:  Uncomplicated alcohol dependence ([place])    Rx / DC Orders ED Discharge Orders          Ordered    chlordiazePOXIDE (LIBRIUM) 25 MG capsule        06/07/22 1630          An After Visit Summary was printed and given to the patient.     Nestor Lewandowsky 06/08/22 [place], DO 06/09/22 [phone]

## 2022-06-07 NOTE — ED Notes (Addendum)
Pt is awake and alert.  Flat affect and anxious mood.  Pt denies si, hi or avh.  CIWA 2.   Will continue to monitor for safety.
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
## 2022-06-07 NOTE — ED Triage Notes (Signed)
Pt requesting detox from alcohol. Seen at [facility] for same, sent to ED for hx of DTs. Last drink yesterday 5pm 12 beers/day x3wks, currently advises agitation, sweats, tingling/itching, anxiety.  Ativan PTA to ED

## 2022-06-10 ENCOUNTER — Ambulatory Visit: Payer: Medicaid Other | Admitting: Hematology and Oncology

## 2022-06-14 ENCOUNTER — Encounter: Payer: Self-pay | Admitting: Hematology and Oncology

## 2022-06-14 ENCOUNTER — Other Ambulatory Visit: Payer: Self-pay

## 2022-06-17 ENCOUNTER — Emergency Department (HOSPITAL_COMMUNITY): Payer: Self-pay

## 2022-06-17 ENCOUNTER — Inpatient Hospital Stay (HOSPITAL_COMMUNITY)
Admission: EM | Admit: 2022-06-17 | Discharge: 2022-06-25 | DRG: 177 | Disposition: A | Discharge status: Home / Self Care | Payer: Self-pay | Attending: Internal Medicine | Admitting: Internal Medicine

## 2022-06-17 ENCOUNTER — Encounter (HOSPITAL_COMMUNITY): Payer: Self-pay

## 2022-06-17 ENCOUNTER — Other Ambulatory Visit: Payer: Self-pay

## 2022-06-17 DIAGNOSIS — Z87891 Personal history of nicotine dependence: Secondary | ICD-10-CM

## 2022-06-17 DIAGNOSIS — N390 Urinary tract infection, site not specified: Secondary | ICD-10-CM | POA: Diagnosis present

## 2022-06-17 DIAGNOSIS — Z6830 Body mass index (BMI) 30.0-30.9, adult: Secondary | ICD-10-CM

## 2022-06-17 DIAGNOSIS — D1800 Hemangioma unspecified site: Secondary | ICD-10-CM | POA: Diagnosis present

## 2022-06-17 DIAGNOSIS — E869 Volume depletion, unspecified: Secondary | ICD-10-CM | POA: Diagnosis present

## 2022-06-17 DIAGNOSIS — Y908 Blood alcohol level of 240 mg/100 ml or more: Secondary | ICD-10-CM | POA: Diagnosis present

## 2022-06-17 DIAGNOSIS — I10 Essential (primary) hypertension: Secondary | ICD-10-CM | POA: Diagnosis present

## 2022-06-17 DIAGNOSIS — E669 Obesity, unspecified: Secondary | ICD-10-CM | POA: Diagnosis present

## 2022-06-17 DIAGNOSIS — E876 Hypokalemia: Secondary | ICD-10-CM | POA: Diagnosis present

## 2022-06-17 DIAGNOSIS — J690 Pneumonitis due to inhalation of food and vomit: Principal | ICD-10-CM | POA: Diagnosis present

## 2022-06-17 DIAGNOSIS — F10220 Alcohol dependence with intoxication, uncomplicated: Secondary | ICD-10-CM | POA: Diagnosis present

## 2022-06-17 DIAGNOSIS — Z8249 Family history of ischemic heart disease and other diseases of the circulatory system: Secondary | ICD-10-CM

## 2022-06-17 DIAGNOSIS — F319 Bipolar disorder, unspecified: Secondary | ICD-10-CM | POA: Diagnosis present

## 2022-06-17 DIAGNOSIS — E273 Drug-induced adrenocortical insufficiency: Secondary | ICD-10-CM | POA: Diagnosis present

## 2022-06-17 DIAGNOSIS — F10239 Alcohol dependence with withdrawal, unspecified: Secondary | ICD-10-CM | POA: Diagnosis not present

## 2022-06-17 DIAGNOSIS — K219 Gastro-esophageal reflux disease without esophagitis: Secondary | ICD-10-CM | POA: Diagnosis present

## 2022-06-17 DIAGNOSIS — Z8371 Family history of colonic polyps: Secondary | ICD-10-CM

## 2022-06-17 DIAGNOSIS — I959 Hypotension, unspecified: Secondary | ICD-10-CM | POA: Diagnosis present

## 2022-06-17 DIAGNOSIS — Z9151 Personal history of suicidal behavior: Secondary | ICD-10-CM

## 2022-06-17 DIAGNOSIS — F10920 Alcohol use, unspecified with intoxication, uncomplicated: Principal | ICD-10-CM

## 2022-06-17 DIAGNOSIS — K766 Portal hypertension: Secondary | ICD-10-CM | POA: Diagnosis present

## 2022-06-17 DIAGNOSIS — A419 Sepsis, unspecified organism: Secondary | ICD-10-CM

## 2022-06-17 DIAGNOSIS — F411 Generalized anxiety disorder: Secondary | ICD-10-CM | POA: Diagnosis present

## 2022-06-17 DIAGNOSIS — Z8349 Family history of other endocrine, nutritional and metabolic diseases: Secondary | ICD-10-CM

## 2022-06-17 DIAGNOSIS — J9600 Acute respiratory failure, unspecified whether with hypoxia or hypercapnia: Secondary | ICD-10-CM | POA: Diagnosis present

## 2022-06-17 DIAGNOSIS — Z811 Family history of alcohol abuse and dependence: Secondary | ICD-10-CM

## 2022-06-17 DIAGNOSIS — D696 Thrombocytopenia, unspecified: Secondary | ICD-10-CM | POA: Diagnosis present

## 2022-06-17 DIAGNOSIS — Z79899 Other long term (current) drug therapy: Secondary | ICD-10-CM

## 2022-06-17 DIAGNOSIS — G928 Other toxic encephalopathy: Secondary | ICD-10-CM | POA: Diagnosis not present

## 2022-06-17 DIAGNOSIS — R9431 Abnormal electrocardiogram [ECG] [EKG]: Secondary | ICD-10-CM | POA: Insufficient documentation

## 2022-06-17 DIAGNOSIS — J9811 Atelectasis: Secondary | ICD-10-CM | POA: Diagnosis present

## 2022-06-17 DIAGNOSIS — F4310 Post-traumatic stress disorder, unspecified: Secondary | ICD-10-CM | POA: Diagnosis present

## 2022-06-17 DIAGNOSIS — J9601 Acute respiratory failure with hypoxia: Secondary | ICD-10-CM | POA: Diagnosis present

## 2022-06-17 DIAGNOSIS — G4730 Sleep apnea, unspecified: Secondary | ICD-10-CM | POA: Diagnosis present

## 2022-06-17 DIAGNOSIS — F10129 Alcohol abuse with intoxication, unspecified: Secondary | ICD-10-CM | POA: Diagnosis present

## 2022-06-17 DIAGNOSIS — D61818 Other pancytopenia: Secondary | ICD-10-CM | POA: Diagnosis present

## 2022-06-17 DIAGNOSIS — R6520 Severe sepsis without septic shock: Secondary | ICD-10-CM

## 2022-06-17 DIAGNOSIS — F419 Anxiety disorder, unspecified: Secondary | ICD-10-CM | POA: Diagnosis present

## 2022-06-17 DIAGNOSIS — Z6372 Alcoholism and drug addiction in family: Secondary | ICD-10-CM

## 2022-06-17 DIAGNOSIS — E872 Acidosis, unspecified: Secondary | ICD-10-CM | POA: Diagnosis present

## 2022-06-17 DIAGNOSIS — R441 Visual hallucinations: Secondary | ICD-10-CM | POA: Diagnosis not present

## 2022-06-17 DIAGNOSIS — K7030 Alcoholic cirrhosis of liver without ascites: Secondary | ICD-10-CM | POA: Diagnosis present

## 2022-06-17 DIAGNOSIS — R571 Hypovolemic shock: Secondary | ICD-10-CM | POA: Diagnosis not present

## 2022-06-17 DIAGNOSIS — K7682 Hepatic encephalopathy: Secondary | ICD-10-CM | POA: Diagnosis present

## 2022-06-17 LAB — BLOOD GAS, ARTERIAL
Acid-base deficit: 7.2 mmol/L — ABNORMAL HIGH (ref 0.0–2.0)
Bicarbonate: 17.9 mmol/L — ABNORMAL LOW (ref 20.0–28.0)
Drawn by: 11249
FIO2: 36 %
O2 Content: 4 L/min
O2 Saturation: 97.1 %
Patient temperature: 36
pCO2 arterial: 33 mmHg (ref 32–48)
pH, Arterial: 7.34 — ABNORMAL LOW (ref 7.35–7.45)
pO2, Arterial: 71 mmHg — ABNORMAL LOW (ref 83–108)

## 2022-06-17 LAB — CBC WITH DIFFERENTIAL/PLATELET
Abs Immature Granulocytes: 0.01 10*3/uL (ref 0.00–0.07)
Basophils Absolute: 0 10*3/uL (ref 0.0–0.1)
Basophils Relative: 0 %
Eosinophils Absolute: 0 10*3/uL (ref 0.0–0.5)
Eosinophils Relative: 1 %
HCT: 37.8 % (ref 36.0–46.0)
Hemoglobin: 12.5 g/dL (ref 12.0–15.0)
Immature Granulocytes: 0 %
Lymphocytes Relative: 49 %
Lymphs Abs: 2.3 10*3/uL (ref 0.7–4.0)
MCH: 29.3 pg (ref 26.0–34.0)
MCHC: 33.1 g/dL (ref 30.0–36.0)
MCV: 88.5 fL (ref 80.0–100.0)
Monocytes Absolute: 0.3 10*3/uL (ref 0.1–1.0)
Monocytes Relative: 7 %
Neutro Abs: 2 10*3/uL (ref 1.7–7.7)
Neutrophils Relative %: 43 %
Platelets: 90 10*3/uL — ABNORMAL LOW (ref 150–400)
RBC: 4.27 MIL/uL (ref 3.87–5.11)
RDW: 16.9 % — ABNORMAL HIGH (ref 11.5–15.5)
WBC: 4.6 10*3/uL (ref 4.0–10.5)
nRBC: 0 % (ref 0.0–0.2)

## 2022-06-17 LAB — I-STAT CHEM 8, ED
BUN: 5 mg/dL — ABNORMAL LOW (ref 6–20)
Calcium, Ion: 1.1 mmol/L — ABNORMAL LOW (ref 1.15–1.40)
Chloride: 111 mmol/L (ref 98–111)
Creatinine, Ser: 1.1 mg/dL — ABNORMAL HIGH (ref 0.44–1.00)
Glucose, Bld: 88 mg/dL (ref 70–99)
HCT: 40 % (ref 36.0–46.0)
Hemoglobin: 13.6 g/dL (ref 12.0–15.0)
Potassium: 3.3 mmol/L — ABNORMAL LOW (ref 3.5–5.1)
Sodium: 143 mmol/L (ref 135–145)
TCO2: 16 mmol/L — ABNORMAL LOW (ref 22–32)

## 2022-06-17 LAB — ETHANOL
Alcohol, Ethyl (B): 289 mg/dL — ABNORMAL HIGH (ref <10)
Alcohol, Ethyl (B): 205 mg/dL — ABNORMAL HIGH (ref <10)

## 2022-06-17 LAB — COMPREHENSIVE METABOLIC PANEL
ALT: 23 U/L (ref 0–44)
AST: 52 U/L — ABNORMAL HIGH (ref 15–41)
Albumin: 3.6 g/dL (ref 3.5–5.0)
Alkaline Phosphatase: 86 U/L (ref 38–126)
Anion gap: 9 (ref 5–15)
BUN: 7 mg/dL (ref 6–20)
CO2: 17 mmol/L — ABNORMAL LOW (ref 22–32)
Calcium: 8.6 mg/dL — ABNORMAL LOW (ref 8.9–10.3)
Chloride: 117 mmol/L — ABNORMAL HIGH (ref 98–111)
Creatinine, Ser: 0.77 mg/dL (ref 0.44–1.00)
GFR, Estimated: >60 mL/min (ref >60)
Glucose, Bld: 92 mg/dL (ref 70–99)
Potassium: 3.2 mmol/L — ABNORMAL LOW (ref 3.5–5.1)
Sodium: 143 mmol/L (ref 135–145)
Total Bilirubin: 1.1 mg/dL (ref 0.3–1.2)
Total Protein: 7.6 g/dL (ref 6.5–8.1)

## 2022-06-17 LAB — URINALYSIS, ROUTINE W REFLEX MICROSCOPIC
Bilirubin Urine: NEGATIVE
Glucose, UA: NEGATIVE mg/dL
Hgb urine dipstick: NEGATIVE
Ketones, ur: NEGATIVE mg/dL
Nitrite: NEGATIVE
Protein, ur: NEGATIVE mg/dL
Specific Gravity, Urine: 1.016 (ref 1.005–1.030)
pH: 5 (ref 5.0–8.0)

## 2022-06-17 LAB — BLOOD GAS, VENOUS
Acid-base deficit: 7.8 mmol/L — ABNORMAL HIGH (ref 0.0–2.0)
Bicarbonate: 17.6 mmol/L — ABNORMAL LOW (ref 20.0–28.0)
O2 Saturation: 96.4 %
Patient temperature: 37
pCO2, Ven: 35 mmHg — ABNORMAL LOW (ref 44–60)
pH, Ven: 7.31 (ref 7.25–7.43)
pO2, Ven: 84 mmHg — ABNORMAL HIGH (ref 32–45)

## 2022-06-17 LAB — PHOSPHORUS: Phosphorus: 3.3 mg/dL (ref 2.5–4.6)

## 2022-06-17 LAB — BASIC METABOLIC PANEL
Anion gap: 5 (ref 5–15)
BUN: <5 mg/dL — ABNORMAL LOW (ref 6–20)
CO2: 18 mmol/L — ABNORMAL LOW (ref 22–32)
Calcium: 7.1 mg/dL — ABNORMAL LOW (ref 8.9–10.3)
Chloride: 122 mmol/L — ABNORMAL HIGH (ref 98–111)
Creatinine, Ser: 0.58 mg/dL (ref 0.44–1.00)
GFR, Estimated: >60 mL/min (ref >60)
Glucose, Bld: 80 mg/dL (ref 70–99)
Potassium: 4.1 mmol/L (ref 3.5–5.1)
Sodium: 145 mmol/L (ref 135–145)

## 2022-06-17 LAB — PROTIME-INR
INR: 1.3 — ABNORMAL HIGH (ref 0.8–1.2)
Prothrombin Time: 15.7 seconds — ABNORMAL HIGH (ref 11.4–15.2)

## 2022-06-17 LAB — APTT: aPTT: 34 seconds (ref 24–36)

## 2022-06-17 LAB — SALICYLATE LEVEL: Salicylate Lvl: <7 mg/dL — ABNORMAL LOW (ref 7.0–30.0)

## 2022-06-17 LAB — RAPID URINE DRUG SCREEN, HOSP PERFORMED
Amphetamines: NOT DETECTED
Barbiturates: NOT DETECTED
Benzodiazepines: POSITIVE — AB
Cocaine: NOT DETECTED
Opiates: NOT DETECTED
Tetrahydrocannabinol: NOT DETECTED

## 2022-06-17 LAB — MRSA NEXT GEN BY PCR, NASAL: MRSA by PCR Next Gen: DETECTED — AB

## 2022-06-17 LAB — TROPONIN I (HIGH SENSITIVITY)
Troponin I (High Sensitivity): 10 ng/L (ref <18)
Troponin I (High Sensitivity): 9 ng/L (ref <18)

## 2022-06-17 LAB — LACTIC ACID, PLASMA
Lactic Acid, Venous: 2.4 mmol/L (ref 0.5–1.9)
Lactic Acid, Venous: 1.8 mmol/L (ref 0.5–1.9)
Lactic Acid, Venous: 2.8 mmol/L (ref 0.5–1.9)

## 2022-06-17 LAB — HEMOGLOBIN AND HEMATOCRIT, BLOOD
HCT: 34.6 % — ABNORMAL LOW (ref 36.0–46.0)
Hemoglobin: 10.9 g/dL — ABNORMAL LOW (ref 12.0–15.0)

## 2022-06-17 LAB — I-STAT BETA HCG BLOOD, ED (MC, WL, AP ONLY): I-stat hCG, quantitative: <5 m[IU]/mL (ref <5)

## 2022-06-17 LAB — LIPASE, BLOOD: Lipase: 49 U/L (ref 11–51)

## 2022-06-17 LAB — ACETAMINOPHEN LEVEL: Acetaminophen (Tylenol), Serum: <10 ug/mL — ABNORMAL LOW (ref 10–30)

## 2022-06-17 LAB — AMMONIA: Ammonia: 78 umol/L — ABNORMAL HIGH (ref 9–35)

## 2022-06-17 LAB — MAGNESIUM: Magnesium: 2.2 mg/dL (ref 1.7–2.4)

## 2022-06-17 MED ORDER — MIDODRINE HCL 5 MG PO TABS
10.0000 mg | ORAL_TABLET | Freq: Two times a day (BID) | ORAL | Status: DC
Start: 2022-06-17 — End: 2022-06-17
  Administered 2022-06-17: 10 mg via ORAL
  Filled 2022-06-17: qty 2

## 2022-06-17 MED ORDER — PANTOPRAZOLE SODIUM 40 MG IV SOLR
40.0000 mg | INTRAVENOUS | Status: DC
  Administered 2022-06-17: 40 mg via INTRAVENOUS
  Filled 2022-06-17: qty 10

## 2022-06-17 MED ORDER — PHENYLEPHRINE HCL-NACL 20-0.9 MG/250ML-% IV SOLN: 0.0000 ug/min | INTRAVENOUS | Status: DC

## 2022-06-17 MED ORDER — POTASSIUM CHLORIDE 10 MEQ/100ML IV SOLN
10.0000 meq | INTRAVENOUS | Status: AC
  Administered 2022-06-17 (×2): 10 meq via INTRAVENOUS
  Filled 2022-06-17 (×2): qty 100

## 2022-06-17 MED ORDER — SODIUM CHLORIDE 0.9 % IV SOLN: INTRAVENOUS | Status: DC | PRN

## 2022-06-17 MED ORDER — GABAPENTIN 400 MG PO CAPS
400.0000 mg | ORAL_CAPSULE | Freq: Three times a day (TID) | ORAL | Status: DC
  Administered 2022-06-17 – 2022-06-25 (×24): 400 mg via ORAL
  Filled 2022-06-17 (×26): qty 1

## 2022-06-17 MED ORDER — IPRATROPIUM-ALBUTEROL 0.5-2.5 (3) MG/3ML IN SOLN
3.0000 mL | Freq: Four times a day (QID) | RESPIRATORY_TRACT | Status: DC
  Administered 2022-06-17: 3 mL via RESPIRATORY_TRACT
  Filled 2022-06-17 (×2): qty 3

## 2022-06-17 MED ORDER — PIPERACILLIN-TAZOBACTAM 3.375 G IVPB 30 MIN
3.3750 g | Freq: Once | INTRAVENOUS | Status: AC
Start: 2022-06-17 — End: 2022-06-17
  Administered 2022-06-17: 3.375 g via INTRAVENOUS
  Filled 2022-06-17: qty 50

## 2022-06-17 MED ORDER — SODIUM CHLORIDE 0.9 % IV BOLUS
1000.0000 mL | Freq: Once | INTRAVENOUS | Status: AC
  Administered 2022-06-17: 1000 mL via INTRAVENOUS

## 2022-06-17 MED ORDER — SODIUM CHLORIDE 0.9 % IV BOLUS (SEPSIS)
1000.0000 mL | Freq: Once | INTRAVENOUS | Status: AC
  Administered 2022-06-17: 1000 mL via INTRAVENOUS

## 2022-06-17 MED ORDER — PIPERACILLIN-TAZOBACTAM 3.375 G IVPB
3.3750 g | Freq: Three times a day (TID) | INTRAVENOUS | Status: DC
  Administered 2022-06-17 – 2022-06-18 (×4): 3.375 g via INTRAVENOUS
  Filled 2022-06-17 (×4): qty 50

## 2022-06-17 MED ORDER — PANTOPRAZOLE SODIUM 40 MG PO TBEC
40.0000 mg | DELAYED_RELEASE_TABLET | Freq: Every day | ORAL | Status: DC
  Administered 2022-06-18 – 2022-06-25 (×8): 40 mg via ORAL
  Filled 2022-06-17 (×8): qty 1

## 2022-06-17 MED ORDER — LACTULOSE 10 GM/15ML PO SOLN
30.0000 g | Freq: Two times a day (BID) | ORAL | Status: DC
  Administered 2022-06-17 – 2022-06-18 (×2): 30 g via ORAL
  Filled 2022-06-17 (×2): qty 45

## 2022-06-17 MED ORDER — MAGNESIUM SULFATE 2 GM/50ML IV SOLN
2.0000 g | Freq: Once | INTRAVENOUS | Status: AC
Start: 2022-06-17 — End: 2022-06-17
  Administered 2022-06-17: 2 g via INTRAVENOUS
  Filled 2022-06-17: qty 50

## 2022-06-17 MED ORDER — ALBUMIN HUMAN 25 % IV SOLN: 50.0000 g | Freq: Once | INTRAVENOUS | Status: DC

## 2022-06-17 MED ORDER — SODIUM CHLORIDE 0.9 % IV SOLN
250.0000 mL | INTRAVENOUS | Status: DC
  Administered 2022-06-17: 250 mL via INTRAVENOUS

## 2022-06-17 MED ORDER — QUETIAPINE FUMARATE 100 MG PO TABS: 100.0000 mg | ORAL_TABLET | Freq: Every day | ORAL | Status: DC

## 2022-06-17 MED ORDER — CHLORHEXIDINE GLUCONATE CLOTH 2 % EX PADS
6.0000 | MEDICATED_PAD | Freq: Every day | CUTANEOUS | Status: DC
Start: 2022-06-17 — End: 2022-06-25
  Administered 2022-06-17 – 2022-06-25 (×9): 6 via TOPICAL

## 2022-06-17 MED ORDER — IPRATROPIUM-ALBUTEROL 0.5-2.5 (3) MG/3ML IN SOLN
3.0000 mL | RESPIRATORY_TRACT | Status: DC | PRN
  Administered 2022-06-18: 3 mL via RESPIRATORY_TRACT
  Filled 2022-06-17: qty 3

## 2022-06-17 MED ORDER — ESCITALOPRAM OXALATE 20 MG PO TABS
20.0000 mg | ORAL_TABLET | Freq: Every day | ORAL | Status: DC
  Administered 2022-06-17: 20 mg via ORAL
  Filled 2022-06-17: qty 1

## 2022-06-17 MED ORDER — POTASSIUM CHLORIDE IN NACL 40-0.9 MEQ/L-% IV SOLN
INTRAVENOUS | Status: DC
  Filled 2022-06-17 (×3): qty 1000

## 2022-06-17 MED ORDER — NOREPINEPHRINE 4 MG/250ML-% IV SOLN
2.0000 ug/min | INTRAVENOUS | Status: DC
  Filled 2022-06-17: qty 250

## 2022-06-17 MED ORDER — METOCLOPRAMIDE HCL 5 MG/ML IJ SOLN
10.0000 mg | Freq: Once | INTRAMUSCULAR | Status: AC
Start: 2022-06-17 — End: 2022-06-17
  Administered 2022-06-17: 10 mg via INTRAVENOUS
  Filled 2022-06-17: qty 2

## 2022-06-17 MED ORDER — SODIUM CHLORIDE 0.9 % IV BOLUS
2000.0000 mL | Freq: Once | INTRAVENOUS | Status: AC
  Administered 2022-06-17: 2000 mL via INTRAVENOUS

## 2022-06-17 MED ORDER — IOHEXOL 350 MG/ML SOLN
80.0000 mL | Freq: Once | INTRAVENOUS | Status: AC | PRN
  Administered 2022-06-17: 80 mL via INTRAVENOUS

## 2022-06-17 MED ORDER — LACTATED RINGERS IV BOLUS
500.0000 mL | Freq: Once | INTRAVENOUS | Status: AC
  Administered 2022-06-17: 500 mL via INTRAVENOUS

## 2022-06-17 MED ORDER — PROCHLORPERAZINE EDISYLATE 10 MG/2ML IJ SOLN
5.0000 mg | INTRAMUSCULAR | Status: DC | PRN
  Administered 2022-06-18 – 2022-06-21 (×3): 5 mg via INTRAVENOUS
  Filled 2022-06-17 (×3): qty 2

## 2022-06-17 MED ORDER — SODIUM CHLORIDE 0.9 % IV SOLN
1000.0000 mL | INTRAVENOUS | Status: DC
  Administered 2022-06-17: 1000 mL via INTRAVENOUS

## 2022-06-17 MED ORDER — SODIUM CHLORIDE (PF) 0.9 % IJ SOLN
INTRAMUSCULAR | Status: AC
  Filled 2022-06-17: qty 50

## 2022-06-17 MED ORDER — ALBUMIN HUMAN 25 % IV SOLN
50.0000 g | Freq: Once | INTRAVENOUS | Status: AC
  Administered 2022-06-17: 50 g via INTRAVENOUS
  Filled 2022-06-17: qty 200

## 2022-06-17 MED ORDER — LACTATED RINGERS IV BOLUS
2000.0000 mL | Freq: Once | INTRAVENOUS | Status: AC
  Administered 2022-06-17: 2000 mL via INTRAVENOUS

## 2022-06-17 MED ORDER — TOPIRAMATE 100 MG PO TABS
100.0000 mg | ORAL_TABLET | Freq: Two times a day (BID) | ORAL | Status: DC
  Administered 2022-06-17 – 2022-06-25 (×17): 100 mg via ORAL
  Filled 2022-06-17 (×17): qty 1

## 2022-06-17 MED ORDER — ONDANSETRON HCL 4 MG/2ML IJ SOLN
4.0000 mg | Freq: Once | INTRAMUSCULAR | Status: AC
  Administered 2022-06-17: 4 mg via INTRAVENOUS
  Filled 2022-06-17: qty 2

## 2022-06-17 MED ORDER — ORAL CARE MOUTH RINSE: 15.0000 mL | OROMUCOSAL | Status: DC | PRN
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
## 2022-06-17 NOTE — ED Notes (Signed)
Patient to CT.

## 2022-06-17 NOTE — Progress Notes (Signed)
A consult was received from an ED physician for Zosyn per pharmacy dosing.  The patient's profile has been reviewed for ht/wt/allergies/indication/available labs.    A one time order has been placed for Zosyn 3.375g IV over 30 minutes.  Further antibiotics/pharmacy consults should be ordered by admitting physician if indicated.                       Thank you, Luiz Ochoa 06/17/2022  6:58 AM
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
## 2022-06-17 NOTE — Progress Notes (Signed)
[place] admitting physician addendum:  The patient was seen due to anxiety associated with nonradiated, precordial pressure-like chest pain and persistent hypotension.  An EKG was done which still show prolonged QT, but is nonischemic.  Troponin levels have been ordered.  Imaging from this morning showed no PE, no consolidation but had atelectasis.  Her lungs are still diminished.  Her BP has been in the 85T and 09P systolic and 11E and 16K diastolic.  She has not been tachycardic after arrival to the SDU. She has urinated some but not much.  Her oral mucosa is still dry despite IV hydration earlier.  Her lactic acid normalized.   Albumin 50 g IVPB x1, another dose of midodrine 10 mg p.o. and 2000 mL of LR bolus ordered.  Tennis Must, MD.

## 2022-06-17 NOTE — Progress Notes (Signed)
Pharmacy Antibiotic Note  Sara [name] is a 46 y.o. female admitted on 06/17/2022 with concern for aspiration pneumonia. Pharmacy has been consulted for Zosyn dosing.  Plan: Zosyn 3.375g IV q8h (each dose infused over 4 hours)  Need for further dosage adjustment appears unlikely at present, so pharmacy will sign off at this time.  Please reconsult if a change in clinical status warrants re-evaluation of dosage.     Temp (24hrs), Avg:98 F (36.7 C), Min:97.4 F (36.3 C), Max:98.5 F (36.9 C)  Recent Labs  Lab 06/17/22 0326 06/17/22 0330 06/17/22 0401 06/17/22 0526  WBC  --  4.6  --   --   CREATININE  --  0.77 1.10*  --   LATICACIDVEN 2.4*  --   --  1.8    Estimated Creatinine Clearance: 62.1 mL/min (A) (by C-G formula based on SCr of 1.1 mg/dL (H)).    Allergies  Allergen Reactions   Gluten Meal Other (See Comments)    Due to celiac disease - bloated, stomach issues    Morphine And Related Other (See Comments)    Slowed HR, lowered BP   Nsaids Other (See Comments)    Caused internal bleeding    Antimicrobials this admission: 7/28 Zosyn >>  Dose adjustments this admission: --  Microbiology results: 7/28 BCx:  7/28 MRSA PCR:   Thank you for allowing pharmacy to be a part of this patient's care.   Lindell Spar, PharmD, BCPS Clinical Pharmacist  06/17/2022 8:48 AM

## 2022-06-17 NOTE — Progress Notes (Signed)
An USGPIV (ultrasound guided PIV) has been placed for short-term vasopressor infusion. A correctly placed ivWatch must be used when administering Vasopressors. Should this treatment be needed beyond 72 hours, central line access should be obtained.  It will be the responsibility of the bedside nurse to follow best practice to prevent extravasations.   ?

## 2022-06-17 NOTE — Procedures (Signed)
Arterial Catheter Insertion Procedure Note  Shiana Rappleye  [id number]  05-01-1976  Date:06/17/22  Time:9:35 PM    Provider Performing: Leta Baptist    Procedure: Insertion of Arterial Line [phone]) without US guidance  Indication(s) Blood pressure monitoring and/or need for frequent ABGs  Consent Risks of the procedure as well as the alternatives and risks of each were explained to the patient and/or caregiver.  Consent for the procedure was obtained and is signed in the bedside chart  Anesthesia None   Time Out Verified patient identification, verified procedure, site/side was marked, verified correct patient position, special equipment/implants available, medications/allergies/relevant history reviewed, required imaging and test results available.   Sterile Technique Maximal sterile technique including full sterile barrier drape, hand hygiene, sterile gown, sterile gloves, mask, hair covering, sterile ultrasound probe cover (if used).   Procedure Description Area of catheter insertion was cleaned with chlorhexidine and draped in sterile fashion. With real-time ultrasound guidance an arterial catheter was placed into the right radial artery.  Appropriate arterial tracings confirmed on monitor.     Complications/Tolerance None; patient tolerated the procedure well.   EBL Minimal   Specimen(s) None
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
## 2022-06-17 NOTE — Sepsis Progress Note (Signed)
Elink is monitoring code sepsis

## 2022-06-17 NOTE — Progress Notes (Signed)
At approximately 1815, this RN transferred patient to bsc per patient request. This RN noticed a container hidden under patient's pillow. This RN remained with patient and notified charge RN. Charge RN to bedside. Upon further investigation, container was a GERM-X container that had approximately 1/3rd missing. This Designer, multimedia also found a small amount of brown liquid in a water bottle. Patient belongings being kept at nurses station. MD Tennis Must notified. See new orders

## 2022-06-17 NOTE — Progress Notes (Signed)
At approximately 1645 patient having 7/10 pressure describing chest pain. MD Tennis Must notified. EKG taken. MD to bedside. See new orders

## 2022-06-17 NOTE — H&P (Signed)
History and Physical    Patient: Sara [name] YNW:[id number] DOB: 12/14/1975 DOA: 06/17/2022 DOS: the patient was seen and examined on 06/17/2022 PCP: Pcp, No  Patient coming from: Home  Chief Complaint:  Chief Complaint  Patient presents with   Alcohol Intoxication   HPI: Sara [name] is a 46 y.o. female with medical history significant of alcohol abuse, alcoholic liver cirrhosis, multiple admissions for alcohol detoxification, UGI bleed/hematemesis, anemia, anxiety, depression, PTSD, pneumonia, pancytopenia, GERD who was brought to the emergency department by the [facility] due to alcohol intoxication.  She normally drinks about 10 beers a day and 1-1/2 bottles of wine.  She stated she was also drinking bobcat yesterday.  She does not remember much of what happened last night or how she got here.  The patient became hypoxic and was put on oxygen.  She has been hypotensive with a systolic as low as 68 mmHg, but is responding to fluids.  She denied fever, chills, rhinorrhea, sore throat, wheezing or hemoptysis.  No chest pain, palpitations, diaphoresis, PND, orthopnea or pitting edema of the lower extremities.  No abdominal pain, nausea, emesis, diarrhea, constipation, melena or hematochezia.  No flank pain, dysuria, frequency or hematuria.  No polyuria, polydipsia, polyphagia or blurred vision.   ED course: Initial vital signs were temperature 98.5 F, pulse 83, respirations 14, BP 68/52 mmHg O2 sat 85% on room air.  The patient was given normal saline 5000 mL bolus, but at the time of my examination she was about halfway through.  She was also given Zosyn 3.375 g IVPB x1 and metoclopramide 10 mg IVP.  Lab work: Urinalysis shows small leukocyte esterase and rare bacteria.  CBC showed a white count of 4.6, hemoglobin 12.5 g/dL platelets 90.  PT 15.7, INR 1.3 and PTT 34.  ABG showed a pH of 7.31, PCO2 of 85 and PO2 of 84 mmHg.  Bicarbonate was 17.6 and acid-base deficit 7.8 mmol/L.   Ammonia level was 78 mol/L.  Alcohol level 289 mg/dL.  Acetaminophen, salicylate and lipase were normal.  CMP showed potassium 3.2, chloride 117 and CO2 17 mmol/L.  Glucose and renal function was normal.  Calcium normalizes with correction.  LFTs showed an AST of 52 units/L, but all other results were otherwise within expected range.  Imaging: One-view portable chest radiograph with low lung volumes without acute or active cardiopulmonary disease.  CTA chest with no evidence of pulmonary embolism.  There was extensive dependent subsegmental atelectasis in the lower lobes of the lungs bilaterally.  Cirrhosis and aortic atherosclerosis.  Review of Systems: As mentioned in the history of present illness. All other systems reviewed and are negative.  Past Medical History:  Diagnosis Date   Alcohol abuse    Alcoholism ([place])    Anemia    Anxiety    Blood transfusion without reported diagnosis    Cirrhosis ([place])    Depression    Esophageal varices with bleeding(456.0) 06/13/2014   GERD (gastroesophageal reflux disease)    Heart murmur    Patient states she may have   Menorrhagia    Pancytopenia ([place]) 01/15/2014   Pneumonia    Portal hypertension ([place])    S/P alcohol detoxification    2-3 days at behavioral health previously   UGI bleed 06/12/2014   Past Surgical History:  Procedure Laterality Date   CHOLECYSTECTOMY     ESOPHAGOGASTRODUODENOSCOPY N/A 06/12/2014   Procedure: ESOPHAGOGASTRODUODENOSCOPY (EGD);  Surgeon: Gatha Mayer, MD;  Location: [facility];  Service: Endoscopy;  Laterality:  N/A;   ESOPHAGOGASTRODUODENOSCOPY (EGD) WITH PROPOFOL N/A 07/29/2014   Procedure: ESOPHAGOGASTRODUODENOSCOPY (EGD) WITH PROPOFOL;  Surgeon: Inda Castle, MD;  Location: [facility];  Service: Endoscopy;  Laterality: N/A;   ESOPHAGOGASTRODUODENOSCOPY (EGD) WITH PROPOFOL N/A 01/20/2018   Procedure: ESOPHAGOGASTRODUODENOSCOPY (EGD) WITH PROPOFOL;  Surgeon: Mauri Pole, MD;  Location: [facility]  ENDOSCOPY;  Service: Endoscopy;  Laterality: N/A;   Social History:  reports that she quit smoking about 3 years ago. Her smoking use included cigarettes. She smoked an average of .25 packs per day. She has never used smokeless tobacco. She reports current alcohol use. She reports that she does not use drugs.  Allergies  Allergen Reactions   Gluten Meal Other (See Comments)    Due to celiac disease - bloated, stomach issues    Morphine And Related Other (See Comments)    Slowed HR, lowered BP   Nsaids Other (See Comments)    Caused internal bleeding    Family History  Problem Relation Age of Onset   Colon polyps Mother    Hypertension Mother    Thyroid disease Mother    Alcoholism Mother    Alcoholism Father    Alcohol abuse Maternal Grandfather    Alcohol abuse Paternal Grandfather    Alcohol abuse Paternal Aunt    Alcohol abuse Maternal Uncle     Prior to Admission medications   Medication Sig Start Date End Date Taking? Authorizing Provider  escitalopram (LEXAPRO) 20 MG tablet Take 1 tablet (20 mg total) by mouth once daily. 05/03/22  Yes Nwoko, Uchenna E, PA  gabapentin (NEURONTIN) 400 MG capsule Take 1 capsule (400 mg total) by mouth 3 (three) times daily. 05/03/22  Yes Nwoko, Terese Door, PA  hydrOXYzine (ATARAX) 50 MG tablet Take 1 tablet (50 mg total) by mouth 3 (three) times daily as needed for anxiety. 05/03/22  Yes Nwoko, Terese Door, PA  Multiple Vitamin (MULTIVITAMIN WITH MINERALS) TABS tablet Take 1 tablet by mouth daily.   Yes [provider]  prazosin (MINIPRESS) 1 MG capsule Take 1 capsule (1 mg total) by mouth once nightly at bedtime. 05/03/22  Yes Nwoko, Terese Door, PA  QUEtiapine (SEROQUEL) 100 MG tablet Take 1 tablet (100 mg total) by mouth once nightly at bedtime. 05/03/22  Yes Nwoko, Terese Door, PA  topiramate (TOPAMAX) 100 MG tablet Take 1 tablet (100 mg total) by mouth 2 (two) times daily. 05/03/22  Yes Nwoko, Terese Door, PA  benzocaine (ORAJEL) 10 % mucosal  gel Use as directed in the mouth or throat 3 (three) times daily as needed for mouth pain. Patient taking differently: Use as directed 1 Application in the mouth or throat 3 (three) times daily as needed for mouth pain. 04/14/22   Massengill, Ovid Curd, MD  chlordiazePOXIDE (LIBRIUM) 25 MG capsule '50mg'$  PO TID x 1D, then 25-'50mg'$  PO BID X 1D, then 25-'50mg'$  PO QD X 1D 06/07/22   Smoot, Sarah A, PA-C  ramelteon (ROZEREM) 8 MG tablet Take 1 tablet (8 mg total) by mouth once nightly at bedtime. 05/03/22   Nwoko, Terese Door, PA  pantoprazole (PROTONIX) 40 MG tablet Take 1 tablet (40 mg total) by mouth daily. 09/14/20 01/21/21  Antony Blackbird, MD    Physical Exam: Vitals:   06/17/22 0630 06/17/22 0645 06/17/22 0715 06/17/22 0857  BP: (!) 85/59 (!) 84/61 91/65   Pulse: 69 78 77   Resp: (!) 21 (!) 22 (!) 26   Temp:   (!) 97.4 F (36.3 C) 97.9 F (36.6 C)  TempSrc:   Oral Oral  SpO2: 96% (!) 83% 94%   Weight:    79.8 kg  Height:    '5\' 4"'$  (1.626 m)   Physical Exam Vitals and nursing note reviewed.  Constitutional:      General: She is awake.     Appearance: She is obese.     Interventions: Nasal cannula in place.  HENT:     Head: Normocephalic.     Mouth/Throat:     Mouth: Mucous membranes are dry.  Eyes:     General: No scleral icterus.    Pupils: Pupils are equal, round, and reactive to light.  Neck:     Vascular: No JVD.  Cardiovascular:     Rate and Rhythm: Normal rate and regular rhythm.     Heart sounds: S1 normal and S2 normal.  Pulmonary:     Effort: Pulmonary effort is normal.     Breath sounds: Examination of the right-lower field reveals decreased breath sounds. Examination of the left-lower field reveals decreased breath sounds. Decreased breath sounds present. No wheezing, rhonchi or rales.  Abdominal:     General: Bowel sounds are normal. There is no distension.     Palpations: Abdomen is soft.     Tenderness: There is no abdominal tenderness. There is no guarding.   Musculoskeletal:     Cervical back: Neck supple.     Right lower leg: No edema.     Left lower leg: No edema.  Skin:    General: Skin is warm and dry.  Neurological:     General: No focal deficit present.     Mental Status: She is alert.  Psychiatric:        Behavior: Behavior is cooperative.     Data Reviewed:  Results are pending, will review when available.  Assessment and Plan: Principal Problem:   Acute respiratory failure with hypoxia ([place]) With questionable   Aspiration pneumonia (HCC) Some areas of atelectasis in lower lobes. We will continue supplemental oxygen. Bronchodilators as needed. Incentive spirometry. Swallow screen. Continue Zosyn every 8 hours.  Active Problems:   Hypotension Secondary to volume depletion/alcohol intoxication. Continue IV fluids. Midodrine 10 mg p.o. twice daily Hold processing at bedtime.    Generalized anxiety disorder   Bipolar 1 disorder (HCC) Resume escitalopram 20 mg p.o. daily. Resume Seroquel 100 mg p.o. at bedtime.    GERD (gastroesophageal reflux disease) Protonix 40 mg IVP given. Continue Protonix 40 mg p.o. daily starting tomorrow.    Alcohol abuse with intoxication ([place]) Monitor for withdrawal symptoms. Magnesium sulfate supplementation. Begin CIWA protocol once more alert.    Thrombocytopenia (HCC) Monitor platelet count. If he worsens we will switch Zosyn to different therapy.     Hypokalemia Replacing. Magnesium has been supplemented.      Advance Care Planning:   Code Status: Full Code   Consults:   Family Communication:   Severity of Illness: The appropriate patient status for this patient is OBSERVATION. Observation status is judged to be reasonable and necessary in order to provide the required intensity of service to ensure the patient's safety. The patient's presenting symptoms, physical exam findings, and initial radiographic and laboratory data in the context of their medical condition  is felt to place them at decreased risk for further clinical deterioration. Furthermore, it is anticipated that the patient will be medically stable for discharge from the hospital within 2 midnights of admission.   Author: Reubin Milan, MD 06/17/2022 9:59 AM  For on call  review [email or web address].   This document was prepared using Dragon voice recognition software and may contain some unintended transcription errors.

## 2022-06-17 NOTE — Progress Notes (Signed)
[facility] admitting physician addendum:  I spoke to [place] [employer] manager) with the [place] control in regards to the patient's ingestion of germ X and QT prolongation.  They agree with lactic acid, ABG, alcohol bola ties and suggested to add ethylene glycol.  They also suggested to continue monitoring EKG every 6 hours. This is immaturities [place] poison control number is [phone]. Her case number is [id number].  Tennis Must, MD.

## 2022-06-17 NOTE — Progress Notes (Signed)
Patient's BP currently 82/56 (64) MD Tennis Must notified. See new orders.

## 2022-06-18 LAB — CBC
HCT: 32.8 % — ABNORMAL LOW (ref 36.0–46.0)
Hemoglobin: 10.5 g/dL — ABNORMAL LOW (ref 12.0–15.0)
MCH: 29.2 pg (ref 26.0–34.0)
MCHC: 32 g/dL (ref 30.0–36.0)
MCV: 91.1 fL (ref 80.0–100.0)
Platelets: 52 10*3/uL — ABNORMAL LOW (ref 150–400)
RBC: 3.6 MIL/uL — ABNORMAL LOW (ref 3.87–5.11)
RDW: 16.8 % — ABNORMAL HIGH (ref 11.5–15.5)
WBC: 2.5 10*3/uL — ABNORMAL LOW (ref 4.0–10.5)
nRBC: 0 % (ref 0.0–0.2)

## 2022-06-18 LAB — BRAIN NATRIURETIC PEPTIDE: B Natriuretic Peptide: 366 pg/mL — ABNORMAL HIGH (ref 0.0–100.0)

## 2022-06-18 LAB — COMPREHENSIVE METABOLIC PANEL
ALT: 20 U/L (ref 0–44)
AST: 45 U/L — ABNORMAL HIGH (ref 15–41)
Albumin: 3.3 g/dL — ABNORMAL LOW (ref 3.5–5.0)
Alkaline Phosphatase: 68 U/L (ref 38–126)
Anion gap: 7 (ref 5–15)
BUN: 5 mg/dL — ABNORMAL LOW (ref 6–20)
CO2: 19 mmol/L — ABNORMAL LOW (ref 22–32)
Calcium: 7.6 mg/dL — ABNORMAL LOW (ref 8.9–10.3)
Chloride: 116 mmol/L — ABNORMAL HIGH (ref 98–111)
Creatinine, Ser: 0.47 mg/dL (ref 0.44–1.00)
GFR, Estimated: >60 mL/min (ref >60)
Glucose, Bld: 81 mg/dL (ref 70–99)
Potassium: 3.9 mmol/L (ref 3.5–5.1)
Sodium: 142 mmol/L (ref 135–145)
Total Bilirubin: 1.2 mg/dL (ref 0.3–1.2)
Total Protein: 6.2 g/dL — ABNORMAL LOW (ref 6.5–8.1)

## 2022-06-18 LAB — HIV ANTIBODY (ROUTINE TESTING W REFLEX): HIV Screen 4th Generation wRfx: NONREACTIVE

## 2022-06-18 LAB — LACTIC ACID, PLASMA: Lactic Acid, Venous: 0.9 mmol/L (ref 0.5–1.9)

## 2022-06-18 LAB — PROCALCITONIN: Procalcitonin: <0.1 ng/mL

## 2022-06-18 LAB — AMMONIA: Ammonia: 69 umol/L — ABNORMAL HIGH (ref 9–35)

## 2022-06-18 MED ORDER — FOLIC ACID 1 MG PO TABS
1.0000 mg | ORAL_TABLET | Freq: Every day | ORAL | Status: DC
Start: 2022-06-18 — End: 2022-06-25
  Administered 2022-06-18 – 2022-06-25 (×8): 1 mg via ORAL
  Filled 2022-06-18 (×8): qty 1

## 2022-06-18 MED ORDER — GUAIFENESIN 100 MG/5ML PO LIQD: 5.0000 mL | ORAL | Status: DC | PRN

## 2022-06-18 MED ORDER — HYDROXYZINE HCL 25 MG PO TABS
50.0000 mg | ORAL_TABLET | Freq: Three times a day (TID) | ORAL | Status: DC | PRN
  Administered 2022-06-20 – 2022-06-25 (×11): 50 mg via ORAL
  Filled 2022-06-18 (×12): qty 2

## 2022-06-18 MED ORDER — ACETAMINOPHEN 325 MG PO TABS
650.0000 mg | ORAL_TABLET | Freq: Four times a day (QID) | ORAL | Status: DC | PRN
  Administered 2022-06-25: 650 mg via ORAL
  Filled 2022-06-18: qty 2

## 2022-06-18 MED ORDER — IPRATROPIUM-ALBUTEROL 0.5-2.5 (3) MG/3ML IN SOLN: 3.0000 mL | RESPIRATORY_TRACT | Status: DC | PRN

## 2022-06-18 MED ORDER — ADULT MULTIVITAMIN W/MINERALS CH
1.0000 | ORAL_TABLET | Freq: Every day | ORAL | Status: DC
Start: 2022-06-18 — End: 2022-06-25
  Administered 2022-06-18 – 2022-06-25 (×8): 1 via ORAL
  Filled 2022-06-18 (×8): qty 1

## 2022-06-18 MED ORDER — LORAZEPAM 1 MG PO TABS
1.0000 mg | ORAL_TABLET | ORAL | Status: AC | PRN
  Administered 2022-06-18 (×2): 2 mg via ORAL
  Administered 2022-06-19: 3 mg via ORAL
  Administered 2022-06-19: 2 mg via ORAL
  Administered 2022-06-19: 3 mg via ORAL
  Administered 2022-06-19: 1 mg via ORAL
  Administered 2022-06-20: 4 mg via ORAL
  Administered 2022-06-20: 3 mg via ORAL
  Filled 2022-06-18: qty 4
  Filled 2022-06-18 (×2): qty 3
  Filled 2022-06-18: qty 2
  Filled 2022-06-18: qty 3
  Filled 2022-06-18 (×2): qty 2
  Filled 2022-06-18: qty 1

## 2022-06-18 MED ORDER — THIAMINE HCL 100 MG/ML IJ SOLN
100.0000 mg | Freq: Every day | INTRAMUSCULAR | Status: DC
Start: 2022-06-18 — End: 2022-06-25
  Administered 2022-06-20: 100 mg via INTRAVENOUS
  Filled 2022-06-18: qty 2

## 2022-06-18 MED ORDER — HYDRALAZINE HCL 20 MG/ML IJ SOLN: 10.0000 mg | INTRAMUSCULAR | Status: DC | PRN

## 2022-06-18 MED ORDER — IPRATROPIUM-ALBUTEROL 0.5-2.5 (3) MG/3ML IN SOLN
3.0000 mL | Freq: Four times a day (QID) | RESPIRATORY_TRACT | Status: DC
  Administered 2022-06-18 (×3): 3 mL via RESPIRATORY_TRACT
  Filled 2022-06-18 (×3): qty 3

## 2022-06-18 MED ORDER — TRAZODONE HCL 50 MG PO TABS
50.0000 mg | ORAL_TABLET | Freq: Every evening | ORAL | Status: DC | PRN
  Administered 2022-06-20 – 2022-06-24 (×5): 50 mg via ORAL
  Filled 2022-06-18 (×5): qty 1

## 2022-06-18 MED ORDER — SODIUM CHLORIDE 0.9 % IV SOLN: INTRAVENOUS | Status: DC

## 2022-06-18 MED ORDER — LACTULOSE 10 GM/15ML PO SOLN
30.0000 g | Freq: Three times a day (TID) | ORAL | Status: DC
  Administered 2022-06-18 – 2022-06-25 (×19): 30 g via ORAL
  Filled 2022-06-18 (×21): qty 45

## 2022-06-18 MED ORDER — SENNOSIDES-DOCUSATE SODIUM 8.6-50 MG PO TABS: 1.0000 | ORAL_TABLET | Freq: Every evening | ORAL | Status: DC | PRN

## 2022-06-18 MED ORDER — QUETIAPINE FUMARATE 25 MG PO TABS
50.0000 mg | ORAL_TABLET | Freq: Every day | ORAL | Status: DC
  Administered 2022-06-18 – 2022-06-24 (×7): 50 mg via ORAL
  Filled 2022-06-18 (×2): qty 1
  Filled 2022-06-18: qty 2
  Filled 2022-06-18 (×4): qty 1

## 2022-06-18 MED ORDER — THIAMINE HCL 100 MG PO TABS
100.0000 mg | ORAL_TABLET | Freq: Every day | ORAL | Status: DC
Start: 2022-06-18 — End: 2022-06-25
  Administered 2022-06-18 – 2022-06-25 (×7): 100 mg via ORAL
  Filled 2022-06-18 (×7): qty 1

## 2022-06-18 MED ORDER — LORAZEPAM 2 MG/ML IJ SOLN
1.0000 mg | INTRAMUSCULAR | Status: AC | PRN
  Administered 2022-06-18 – 2022-06-20 (×5): 2 mg via INTRAVENOUS
  Administered 2022-06-20 (×3): 4 mg via INTRAVENOUS
  Administered 2022-06-20: 2 mg via INTRAVENOUS
  Filled 2022-06-18: qty 1
  Filled 2022-06-18: qty 2
  Filled 2022-06-18 (×4): qty 1
  Filled 2022-06-18 (×2): qty 2
  Filled 2022-06-18 (×2): qty 1

## 2022-06-18 MED ORDER — METOPROLOL TARTRATE 5 MG/5ML IV SOLN: 5.0000 mg | INTRAVENOUS | Status: DC | PRN

## 2022-06-18 NOTE — Progress Notes (Addendum)
PROGRESS NOTE    Sara [name]  ZOX:[id number] DOB: 05/22/1976 DOA: 06/17/2022 PCP: Pcp, No   Brief Narrative:  46 year old with history of alcohol abuse, cirrhosis, multiple admissions for alcohol detox, upper GI bleed, anemia, anxiety, depression, PTSD, pancytopenia, GERD comes to the ED by [facility] due to alcohol intoxication.  She drinks about 10 beers and about 1-2 bottles of wine daily.  Initially noted to be hypotensive and hypoxic requiring IV fluids.  UA showed mild urinary tract infection, ammonia level 78, alcohol level 289, potassium 3.2.  CTA chest was negative.   Assessment & Plan:  Principal Problem:   Acute respiratory failure with hypoxia (HCC) Active Problems:   Generalized anxiety disorder   GERD (gastroesophageal reflux disease)   Alcohol abuse with intoxication (HCC)   Thrombocytopenia (HCC)   Hypotension   Hypokalemia   Bipolar 1 disorder (HCC)   Aspiration pneumonia (HCC)   Acute respiratory failure (HCC)    Acute respiratory failure with hypoxia (HCC) secondary to   Aspiration pneumonia ([place]) Hypovolemic shock -Initially patient had hypovolemic shock requiring IV fluids, norepinephrine drip was ordered.  Blood pressure responded to IV fluids. Initially on Empiric IV Zosyn.  Scheduled and as needed bronchodilators.  I-S/flutter valve -Midodrine if needed. Stop levophed.  - ProCal neg- will stop IV Abx. BNP is elevated will get Echo, she has at risk for CM given her EtOh use. Will stop IVF     Alcohol abuse with intoxication ([place]) Alcohol withdrawal protocol in place  Hypokalemia/hypomagnesemia - Aggressive repletion  Lactic acidosis - IV fluids.  Recheck again.  Alcoholic liver cirrhosis Hepatic encephalopathy - Elevated ammonia level.  Lactulose TID       Generalized anxiety disorder   Bipolar 1 disorder (HCC) Seroquel '50mg'$  qhs ('100mg'$  at home)     GERD (gastroesophageal reflux disease) History of GI bleed Daily PPI      Thrombocytopenia  (HCC) Pancytopenia Likely from underlying cirrhosis.  No evidence of bleeding.  Continue to monitor        DVT prophylaxis: SCDs Start: 06/17/22 0809 Code Status: Full Family Communication:    Status is: Inpatient Remains inpatient appropriate because: If BP remains stable, her A line can be removed and she can be transitioned out of the ICU      Subjective: Feels better, no new complaints. BP is still little soft. She is making good urine at this time.    Examination:  General exam: Appears calm and comfortable; [place] Respiratory system: minimal bibasilar crackles.  Cardiovascular system: S1 & S2 heard, RRR. No JVD, murmurs, rubs, gallops or clicks. No pedal edema. Gastrointestinal system: Abdomen is nondistended, soft and nontender. No organomegaly or masses felt. Normal bowel sounds heard. Central nervous system: Alert and oriented. No focal neurological deficits. Extremities: Symmetric 5 x 5 power. Skin: No rashes, lesions or ulcers Psychiatry: Judgement and insight appear normal. Mood & affect appropriate.     Objective: Vitals:   06/18/22 0130 06/18/22 0200 06/18/22 0230 06/18/22 0300  BP:      Pulse: 72 73 72 78  Resp: (!) 33 (!) 24 (!) 22 (!) 34  Temp:      TempSrc:      SpO2: 91% 95% 95% 92%  Weight:      Height:        Intake/Output Summary (Last 24 hours) at 06/18/2022 0722 Last data filed at 06/18/2022 0217 Gross per 24 hour  Intake 4833.84 ml  Output 2250 ml  Net 2583.84 ml  Filed Weights   06/17/22 0857  Weight: 79.8 kg     Data Reviewed:   CBC: Recent Labs  Lab 06/17/22 0330 06/17/22 0401 06/17/22 1726 06/18/22 0432  WBC 4.6  --   --  2.5*  NEUTROABS 2.0  --   --   --   HGB 12.5 13.6 10.9* 10.5*  HCT 37.8 40.0 34.6* 32.8*  MCV 88.5  --   --  91.1  PLT 90*  --   --  52*   Basic Metabolic Panel: Recent Labs  Lab 06/17/22 0330 06/17/22 0401 06/17/22 1726 06/18/22 0432  NA 143 143 145 142  K 3.2* 3.3* 4.1 3.9  CL 117*  111 122* 116*  CO2 17*  --  18* 19*  GLUCOSE 92 88 80 81  BUN 7 5* <5* 5*  CREATININE 0.77 1.10* 0.58 0.47  CALCIUM 8.6*  --  7.1* 7.6*  MG 2.2  --   --   --   PHOS 3.3  --   --   --    GFR: Estimated Creatinine Clearance: 90.7 mL/min (by C-G formula based on SCr of 0.47 mg/dL). Liver Function Tests: Recent Labs  Lab 06/17/22 0330 06/18/22 0432  AST 52* 45*  ALT 23 20  ALKPHOS 86 68  BILITOT 1.1 1.2  PROT 7.6 6.2*  ALBUMIN 3.6 3.3*   Recent Labs  Lab 06/17/22 0330  LIPASE 49   Recent Labs  Lab 06/17/22 0327 06/18/22 0431  AMMONIA 78* 69*   Coagulation Profile: Recent Labs  Lab 06/17/22 0330  INR 1.3*   Cardiac Enzymes: No results for input(s): "CKTOTAL", "CKMB", "CKMBINDEX", "TROPONINI" in the last 168 hours. BNP (last 3 results) No results for input(s): "PROBNP" in the last 8760 hours. HbA1C: No results for input(s): "HGBA1C" in the last 72 hours. CBG: No results for input(s): "GLUCAP" in the last 168 hours. Lipid Profile: No results for input(s): "CHOL", "HDL", "LDLCALC", "TRIG", "CHOLHDL", "LDLDIRECT" in the last 72 hours. Thyroid Function Tests: No results for input(s): "TSH", "T4TOTAL", "FREET4", "T3FREE", "THYROIDAB" in the last 72 hours. Anemia Panel: No results for input(s): "VITAMINB12", "FOLATE", "FERRITIN", "TIBC", "IRON", "RETICCTPCT" in the last 72 hours. Sepsis Labs: Recent Labs  Lab 06/17/22 0326 06/17/22 0526 06/17/22 1927  LATICACIDVEN 2.4* 1.8 2.8*    Recent Results (from the past 240 hour(s))  MRSA Next Gen by PCR, Nasal     Status: Abnormal   Collection Time: 06/17/22  8:53 AM   Specimen: Nasal Mucosa; Nasal Swab  Result Value Ref Range Status   MRSA by PCR Next Gen DETECTED (A) NOT DETECTED Final    Comment: (NOTE) The GeneXpert MRSA Assay (FDA approved for NASAL specimens only), is one component of a comprehensive MRSA colonization surveillance program. It is not intended to diagnose MRSA infection nor to guide or monitor  treatment for MRSA infections. Test performance is not FDA approved in patients less than 20 years old. Performed at [facility], [place] [address], [place], [place] [address]          Radiology Studies: CT Angio Chest PE W and/or Wo Contrast  Result Date: 06/17/2022 CLINICAL DATA:  46 year old female with history of abnormal venous blood gas and clinical concern for pulmonary embolism. Alcohol intoxication. EXAM: CT ANGIOGRAPHY CHEST WITH CONTRAST TECHNIQUE: Multidetector CT imaging of the chest was performed using the standard protocol during bolus administration of intravenous contrast. Multiplanar CT image reconstructions and MIPs were obtained to evaluate the vascular anatomy. RADIATION DOSE REDUCTION: This  exam was performed according to the departmental dose-optimization program which includes automated exposure control, adjustment of the mA and/or kV according to patient size and/or use of iterative reconstruction technique. CONTRAST:  60m OMNIPAQUE IOHEXOL 350 MG/ML SOLN COMPARISON:  Chest CTA 06/23/2020. FINDINGS: Cardiovascular: No filling defects are noted in the pulmonary arterial tree to suggest pulmonary embolism. Heart size is borderline enlarged. There is no significant pericardial fluid, thickening or pericardial calcification. Aortic atherosclerosis. No definite coronary artery calcifications. Mediastinum/Nodes: No pathologically enlarged mediastinal or hilar lymph nodes. Esophagus is unremarkable in appearance. No axillary lymphadenopathy. Lungs/Pleura: Dependent areas of subsegmental atelectasis are noted in the lower lobes of both lungs. No confluent consolidative airspace disease. No pleural effusions. No definite suspicious appearing pulmonary nodules or masses are noted. Upper Abdomen: Liver has a shrunken appearance and nodular contour, indicative of underlying cirrhosis. Musculoskeletal: There are no aggressive appearing lytic or blastic lesions noted in the  visualized portions of the skeleton. Review of the MIP images confirms the above findings. IMPRESSION: 1. No evidence of pulmonary embolism. 2. Extensive dependent subsegmental atelectasis in the lower lobes of the lungs bilaterally. 3. Cirrhosis. 4. Aortic atherosclerosis. Aortic Atherosclerosis (ICD10-I70.0). Electronically Signed   By: DVinnie LangtonM.D.   On: 06/17/2022 06:44   DG Chest Port 1 View  Result Date: 06/17/2022 CLINICAL DATA:  Questionable sepsis. EXAM: PORTABLE CHEST 1 VIEW COMPARISON:  January 09, 2022 FINDINGS: Limited study secondary to patient rotation. The heart size and mediastinal contours are within normal limits. Low lung volumes are noted. There is no evidence of acute infiltrate, pleural effusion or pneumothorax. The visualized skeletal structures are unremarkable. IMPRESSION: Low lung volumes without acute or active cardiopulmonary disease. Electronically Signed   By: TVirgina NorfolkM.D.   On: 06/17/2022 04:28        Scheduled Meds:  Chlorhexidine Gluconate Cloth  6 each Topical Daily   gabapentin  400 mg Oral TID   lactulose  30 g Oral BID   pantoprazole  40 mg Oral Daily   topiramate  100 mg Oral BID   Continuous Infusions:  sodium chloride Stopped (06/17/22 1402)   sodium chloride 250 mL (06/17/22 2132)   sodium chloride     norepinephrine (LEVOPHED) Adult infusion Stopped (06/17/22 2131)   piperacillin-tazobactam (ZOSYN)  IV 3.375 g (06/18/22 0500)     LOS: 1 day   Time spent= 35 mins    Hinda Lindor CArsenio Loader MD [facility]  If 7PM-7AM, please contact night-coverage  06/18/2022, 7:22 AM

## 2022-06-19 ENCOUNTER — Inpatient Hospital Stay (HOSPITAL_COMMUNITY): Payer: Self-pay

## 2022-06-19 DIAGNOSIS — I428 Other cardiomyopathies: Secondary | ICD-10-CM

## 2022-06-19 LAB — ECHOCARDIOGRAM COMPLETE
Area-P 1/2: 3.71 cm2
Calc EF: 49.3 %
Height: 64 in
S' Lateral: 3.1 cm
Single Plane A2C EF: 45.6 %
Single Plane A4C EF: 55.2 %
Weight: 2814.83 oz

## 2022-06-19 LAB — COMPREHENSIVE METABOLIC PANEL
ALT: 19 U/L (ref 0–44)
AST: 39 U/L (ref 15–41)
Albumin: 3.6 g/dL (ref 3.5–5.0)
Alkaline Phosphatase: 78 U/L (ref 38–126)
Anion gap: 7 (ref 5–15)
BUN: 6 mg/dL (ref 6–20)
CO2: 20 mmol/L — ABNORMAL LOW (ref 22–32)
Calcium: 8.4 mg/dL — ABNORMAL LOW (ref 8.9–10.3)
Chloride: 110 mmol/L (ref 98–111)
Creatinine, Ser: 0.42 mg/dL — ABNORMAL LOW (ref 0.44–1.00)
GFR, Estimated: >60 mL/min (ref >60)
Glucose, Bld: 95 mg/dL (ref 70–99)
Potassium: 3.3 mmol/L — ABNORMAL LOW (ref 3.5–5.1)
Sodium: 137 mmol/L (ref 135–145)
Total Bilirubin: 2 mg/dL — ABNORMAL HIGH (ref 0.3–1.2)
Total Protein: 6.8 g/dL (ref 6.5–8.1)

## 2022-06-19 LAB — CBC
HCT: 34.8 % — ABNORMAL LOW (ref 36.0–46.0)
Hemoglobin: 11.2 g/dL — ABNORMAL LOW (ref 12.0–15.0)
MCH: 28.9 pg (ref 26.0–34.0)
MCHC: 32.2 g/dL (ref 30.0–36.0)
MCV: 89.7 fL (ref 80.0–100.0)
Platelets: 52 10*3/uL — ABNORMAL LOW (ref 150–400)
RBC: 3.88 MIL/uL (ref 3.87–5.11)
RDW: 16 % — ABNORMAL HIGH (ref 11.5–15.5)
WBC: 2.7 10*3/uL — ABNORMAL LOW (ref 4.0–10.5)
nRBC: 0 % (ref 0.0–0.2)

## 2022-06-19 LAB — MAGNESIUM: Magnesium: 1.9 mg/dL (ref 1.7–2.4)

## 2022-06-19 MED ORDER — POTASSIUM CHLORIDE 10 MEQ/100ML IV SOLN
10.0000 meq | INTRAVENOUS | Status: AC
  Administered 2022-06-19 (×2): 10 meq via INTRAVENOUS
  Filled 2022-06-19 (×2): qty 100

## 2022-06-19 MED ORDER — POTASSIUM CHLORIDE CRYS ER 20 MEQ PO TBCR
40.0000 meq | EXTENDED_RELEASE_TABLET | Freq: Once | ORAL | Status: AC
  Administered 2022-06-19: 40 meq via ORAL
  Filled 2022-06-19: qty 2

## 2022-06-19 MED ORDER — IPRATROPIUM-ALBUTEROL 0.5-2.5 (3) MG/3ML IN SOLN
3.0000 mL | Freq: Three times a day (TID) | RESPIRATORY_TRACT | Status: DC
  Administered 2022-06-19 – 2022-06-20 (×3): 3 mL via RESPIRATORY_TRACT
  Filled 2022-06-19 (×4): qty 3

## 2022-06-19 MED ORDER — BUDESONIDE 0.5 MG/2ML IN SUSP
0.5000 mg | Freq: Two times a day (BID) | RESPIRATORY_TRACT | Status: DC
  Administered 2022-06-19 – 2022-06-25 (×12): 0.5 mg via RESPIRATORY_TRACT
  Filled 2022-06-19 (×12): qty 2

## 2022-06-19 MED ORDER — METHYLPREDNISOLONE SODIUM SUCC 40 MG IJ SOLR
40.0000 mg | Freq: Two times a day (BID) | INTRAMUSCULAR | Status: DC
  Administered 2022-06-19 – 2022-06-21 (×6): 40 mg via INTRAVENOUS
  Filled 2022-06-19 (×6): qty 1

## 2022-06-19 MED ORDER — IPRATROPIUM-ALBUTEROL 0.5-2.5 (3) MG/3ML IN SOLN: 3.0000 mL | Freq: Two times a day (BID) | RESPIRATORY_TRACT | Status: DC

## 2022-06-19 MED ORDER — MIDODRINE HCL 5 MG PO TABS
5.0000 mg | ORAL_TABLET | Freq: Three times a day (TID) | ORAL | Status: DC
  Administered 2022-06-19 (×3): 5 mg via ORAL
  Filled 2022-06-19 (×3): qty 1

## 2022-06-19 MED ORDER — IPRATROPIUM-ALBUTEROL 0.5-2.5 (3) MG/3ML IN SOLN
3.0000 mL | Freq: Three times a day (TID) | RESPIRATORY_TRACT | Status: DC
  Administered 2022-06-19: 3 mL via RESPIRATORY_TRACT
  Filled 2022-06-19: qty 3

## 2022-06-19 NOTE — Progress Notes (Signed)
PROGRESS NOTE    Sara [name]  YQM:[id number] DOB: 12-23-1975 DOA: 06/17/2022 PCP: Pcp, No   Brief Narrative:  46 year old with history of alcohol abuse, cirrhosis, multiple admissions for alcohol detox, upper GI bleed, anemia, anxiety, depression, PTSD, pancytopenia, GERD comes to the ED by GDP due to alcohol intoxication.  She drinks about 10 beers and about 1-2 bottles of wine daily.  Initially noted to be hypotensive and hypoxic requiring IV fluids.  UA showed mild urinary tract infection, ammonia level 78, alcohol level 289, potassium 3.2.  CTA chest was negative.   Assessment & Plan:  Principal Problem:   Acute respiratory failure with hypoxia (HCC) Active Problems:   Generalized anxiety disorder   GERD (gastroesophageal reflux disease)   Alcohol abuse with intoxication (HCC)   Thrombocytopenia (HCC)   Hypotension   Hypokalemia   Bipolar 1 disorder (HCC)   Aspiration pneumonia (HCC)   Acute respiratory failure (HCC)    Acute respiratory failure with hypoxia (HCC) secondary to   Aspiration pneumonia ([place]) Hypovolemic shock -Initially patient had hypovolemic shock. Has A line in place, still soft BPs. Scheduled and as needed bronchodilators.  I-S/flutter valve. Add Solumedrol to help with the inflammation. Wean off o2.  -Midodrine '5mg'$  TID. CXR - looks ok - ProCal neg- will stop IV Abx. Echo done this morning      Alcohol abuse with intoxication ([place]) Alcohol withdrawal protocol in place  Hypokalemia/hypomagnesemia - Aggressive repletion  Lactic acidosis - IV fluids.  Recheck again.  Alcoholic liver cirrhosis Hepatic encephalopathy - Elevated ammonia level.  Lactulose TID       Generalized anxiety disorder   Bipolar 1 disorder (HCC) Seroquel '50mg'$  qhs ('100mg'$  at home)     GERD (gastroesophageal reflux disease) History of GI bleed Daily PPI      Thrombocytopenia (HCC) Pancytopenia Likely from underlying cirrhosis.  No evidence of bleeding.  Continue to  monitor        DVT prophylaxis: SCDs Start: 06/17/22 0809 Code Status: Full Family Communication:    Status is: Inpatient Remains inpatient appropriate because: Monitor in stpdown due to soft BPs    Subjective: Feels ok, BP is still soft. Some exertional SOB and cough  Examination:  Constitutional: Not in acute distress; 5L [address] Respiratory: mild bibasilar rhonchi.  Cardiovascular: Normal sinus rhythm, no rubs Abdomen: Nontender nondistended good bowel sounds Musculoskeletal: No edema noted Skin: No rashes seen Neurologic: CN 2-12 grossly intact.  And nonfocal Psychiatric: Normal judgment and insight. Alert and oriented x 3. Normal mood.    Objective: Vitals:   06/19/22 0600 06/19/22 0700 06/19/22 0800 06/19/22 0900  BP: (!) 83/41 (!) 77/45 (!) 95/59 104/61  Pulse: 71 71 64 65  Resp: (!) 28 (!) 24 (!) 26 (!) 29  Temp:      TempSrc:      SpO2: 90% 90% 93% 90%  Weight:      Height:        Intake/Output Summary (Last 24 hours) at 06/19/2022 1001 Last data filed at 06/19/2022 0911 Gross per 24 hour  Intake 497.54 ml  Output 1050 ml  Net -552.46 ml   Filed Weights   06/17/22 0857  Weight: 79.8 kg     Data Reviewed:   CBC: Recent Labs  Lab 06/17/22 0330 06/17/22 0401 06/17/22 1726 06/18/22 0432 06/19/22 0500  WBC 4.6  --   --  2.5* 2.7*  NEUTROABS 2.0  --   --   --   --   HGB 12.5 13.6  10.9* 10.5* 11.2*  HCT 37.8 40.0 34.6* 32.8* 34.8*  MCV 88.5  --   --  91.1 89.7  PLT 90*  --   --  52* 52*   Basic Metabolic Panel: Recent Labs  Lab 06/17/22 0330 06/17/22 0401 06/17/22 1726 06/18/22 0432 06/19/22 0500  NA 143 143 145 142 137  K 3.2* 3.3* 4.1 3.9 3.3*  CL 117* 111 122* 116* 110  CO2 17*  --  18* 19* 20*  GLUCOSE 92 88 80 81 95  BUN 7 5* <5* 5* 6  CREATININE 0.77 1.10* 0.58 0.47 0.42*  CALCIUM 8.6*  --  7.1* 7.6* 8.4*  MG 2.2  --   --   --  1.9  PHOS 3.3  --   --   --   --    GFR: Estimated Creatinine Clearance: 90.7 mL/min (A) (by C-G  formula based on SCr of 0.42 mg/dL (L)). Liver Function Tests: Recent Labs  Lab 06/17/22 0330 06/18/22 0432 06/19/22 0500  AST 52* 45* 39  ALT '23 20 19  '$ ALKPHOS 86 68 78  BILITOT 1.1 1.2 2.0*  PROT 7.6 6.2* 6.8  ALBUMIN 3.6 3.3* 3.6   Recent Labs  Lab 06/17/22 0330  LIPASE 49   Recent Labs  Lab 06/17/22 0327 06/18/22 0431  AMMONIA 78* 69*   Coagulation Profile: Recent Labs  Lab 06/17/22 0330  INR 1.3*   Cardiac Enzymes: No results for input(s): "CKTOTAL", "CKMB", "CKMBINDEX", "TROPONINI" in the last 168 hours. BNP (last 3 results) No results for input(s): "PROBNP" in the last 8760 hours. HbA1C: No results for input(s): "HGBA1C" in the last 72 hours. CBG: No results for input(s): "GLUCAP" in the last 168 hours. Lipid Profile: No results for input(s): "CHOL", "HDL", "LDLCALC", "TRIG", "CHOLHDL", "LDLDIRECT" in the last 72 hours. Thyroid Function Tests: No results for input(s): "TSH", "T4TOTAL", "FREET4", "T3FREE", "THYROIDAB" in the last 72 hours. Anemia Panel: No results for input(s): "VITAMINB12", "FOLATE", "FERRITIN", "TIBC", "IRON", "RETICCTPCT" in the last 72 hours. Sepsis Labs: Recent Labs  Lab 06/17/22 0326 06/17/22 0526 06/17/22 1927 06/18/22 0828  PROCALCITON  --   --   --  <0.10  LATICACIDVEN 2.4* 1.8 2.8* 0.9    Recent Results (from the past 240 hour(s))  Blood Culture (routine x 2)     Status: None (Preliminary result)   Collection Time: 06/17/22  3:31 AM   Specimen: BLOOD  Result Value Ref Range Status   Specimen Description   Final    BLOOD BLOOD LEFT FOREARM Performed at [facility], [place] [address], [place], [place] [address]    Special Requests   Final    BOTTLES DRAWN AEROBIC AND ANAEROBIC Blood Culture adequate volume Performed at [facility] [address] [place], [place] [address]    Culture   Final    NO GROWTH 2 DAYS Performed at [facility] [address] [place],  [place] [address]    Report Status PENDING  Incomplete  Blood Culture (routine x 2)     Status: None (Preliminary result)   Collection Time: 06/17/22  3:55 AM   Specimen: BLOOD  Result Value Ref Range Status   Specimen Description   Final    BLOOD BLOOD RIGHT FOREARM Performed at [facility] [address], [place], [place] [address]    Special Requests   Final    BOTTLES DRAWN AEROBIC AND ANAEROBIC Blood Culture results may not be optimal due to an excessive  volume of blood received in culture bottles Performed at [facility] [address] [place], [place] [address]    Culture   Final    NO GROWTH 2 DAYS Performed at [facility] [address] [place], [place] [address]    Report Status PENDING  Incomplete  MRSA Next Gen by PCR, Nasal     Status: Abnormal   Collection Time: 06/17/22  8:53 AM   Specimen: Nasal Mucosa; Nasal Swab  Result Value Ref Range Status   MRSA by PCR Next Gen DETECTED (A) NOT DETECTED Final    Comment: (NOTE) The GeneXpert MRSA Assay (FDA approved for NASAL specimens only), is one component of a comprehensive MRSA colonization surveillance program. It is not intended to diagnose MRSA infection nor to guide or monitor treatment for MRSA infections. Test performance is not FDA approved in patients less than 55 years old. Performed at [facility], [place] [address] [place], [place] [address]          Radiology Studies: DG Chest Port 1 View  Result Date: 06/19/2022 CLINICAL DATA:  Shortness of breath EXAM: PORTABLE CHEST 1 VIEW COMPARISON:  06/17/2022 and prior studies FINDINGS: UPPER limits normal heart size again noted. There is no evidence of focal airspace disease, pulmonary edema, suspicious pulmonary nodule/mass, pleural effusion, or pneumothorax. No acute bony abnormalities are identified. IMPRESSION: No active disease. Electronically Signed   By: Margarette Canada M.D.   On: 06/19/2022 09:31         Scheduled Meds:  Chlorhexidine Gluconate Cloth  6 each Topical Daily   folic acid  1 mg Oral Daily   gabapentin  400 mg Oral TID   ipratropium-albuterol  3 mL Nebulization TID   lactulose  30 g Oral TID   midodrine  5 mg Oral TID WC   multivitamin with minerals  1 tablet Oral Daily   pantoprazole  40 mg Oral Daily   QUEtiapine  50 mg Oral QHS   thiamine  100 mg Oral Daily   Or   thiamine  100 mg Intravenous Daily   topiramate  100 mg Oral BID   Continuous Infusions:  sodium chloride Stopped (06/17/22 1402)   sodium chloride 250 mL (06/17/22 2132)   sodium chloride     potassium chloride Stopped (06/19/22 0919)     LOS: 2 days   Time spent= 35 mins    Carlisle Torgeson Arsenio Loader, MD [facility]  If 7PM-7AM, please contact night-coverage  06/19/2022, 10:01 AM

## 2022-06-19 NOTE — Progress Notes (Signed)
  Echocardiogram 2D Echocardiogram has been performed.  Sara [name] 06/19/2022, 9:39 AM

## 2022-06-20 ENCOUNTER — Inpatient Hospital Stay: Payer: Medicaid Other | Admitting: Hematology and Oncology

## 2022-06-20 LAB — CBC
HCT: 38.1 % (ref 36.0–46.0)
Hemoglobin: 12.4 g/dL (ref 12.0–15.0)
MCH: 29 pg (ref 26.0–34.0)
MCHC: 32.5 g/dL (ref 30.0–36.0)
MCV: 89 fL (ref 80.0–100.0)
Platelets: 75 10*3/uL — ABNORMAL LOW (ref 150–400)
RBC: 4.28 MIL/uL (ref 3.87–5.11)
RDW: 15.9 % — ABNORMAL HIGH (ref 11.5–15.5)
WBC: 3.9 10*3/uL — ABNORMAL LOW (ref 4.0–10.5)
nRBC: 0 % (ref 0.0–0.2)

## 2022-06-20 LAB — COMPREHENSIVE METABOLIC PANEL
ALT: 22 U/L (ref 0–44)
AST: 29 U/L (ref 15–41)
Albumin: 3.8 g/dL (ref 3.5–5.0)
Alkaline Phosphatase: 91 U/L (ref 38–126)
Anion gap: 8 (ref 5–15)
BUN: 9 mg/dL (ref 6–20)
CO2: 17 mmol/L — ABNORMAL LOW (ref 22–32)
Calcium: 9.2 mg/dL (ref 8.9–10.3)
Chloride: 110 mmol/L (ref 98–111)
Creatinine, Ser: 0.38 mg/dL — ABNORMAL LOW (ref 0.44–1.00)
GFR, Estimated: >60 mL/min (ref >60)
Glucose, Bld: 129 mg/dL — ABNORMAL HIGH (ref 70–99)
Potassium: 3.8 mmol/L (ref 3.5–5.1)
Sodium: 135 mmol/L (ref 135–145)
Total Bilirubin: 1.7 mg/dL — ABNORMAL HIGH (ref 0.3–1.2)
Total Protein: 7.7 g/dL (ref 6.5–8.1)

## 2022-06-20 LAB — ETHYLENE GLYCOL: Ethylene Glycol Lvl: <5 mg/dL

## 2022-06-20 LAB — MAGNESIUM: Magnesium: 1.8 mg/dL (ref 1.7–2.4)

## 2022-06-20 MED ORDER — MUPIROCIN 2 % EX OINT
1.0000 | TOPICAL_OINTMENT | Freq: Two times a day (BID) | CUTANEOUS | Status: AC
  Administered 2022-06-20 – 2022-06-24 (×10): 1 via NASAL
  Filled 2022-06-20 (×2): qty 22

## 2022-06-20 MED ORDER — IPRATROPIUM-ALBUTEROL 0.5-2.5 (3) MG/3ML IN SOLN
3.0000 mL | Freq: Two times a day (BID) | RESPIRATORY_TRACT | Status: DC
  Administered 2022-06-20 – 2022-06-25 (×10): 3 mL via RESPIRATORY_TRACT
  Filled 2022-06-20 (×10): qty 3

## 2022-06-20 MED ORDER — MIDODRINE HCL 5 MG PO TABS
10.0000 mg | ORAL_TABLET | Freq: Three times a day (TID) | ORAL | Status: DC
Start: 2022-06-20 — End: 2022-06-25
  Administered 2022-06-20 – 2022-06-25 (×17): 10 mg via ORAL
  Filled 2022-06-20 (×17): qty 2

## 2022-06-20 NOTE — Progress Notes (Signed)
PROGRESS NOTE    Sara [name]  HQI:[id number] DOB: 18-Sep-1976 DOA: 06/17/2022 PCP: Pcp, No   Brief Narrative:  46 year old with history of alcohol abuse, cirrhosis, multiple admissions for alcohol detox, upper GI bleed, anemia, anxiety, depression, PTSD, pancytopenia, GERD comes to the ED by [facility] due to alcohol intoxication.  She drinks about 10 beers and about 1-2 bottles of wine daily.  Initially noted to be hypotensive and hypoxic requiring IV fluids.  UA showed mild urinary tract infection, ammonia level 78, alcohol level 289, potassium 3.2.  CTA chest was negative.   Assessment & Plan:  Principal Problem:   Acute respiratory failure with hypoxia (HCC) Active Problems:   Generalized anxiety disorder   GERD (gastroesophageal reflux disease)   Alcohol abuse with intoxication (HCC)   Thrombocytopenia (HCC)   Hypotension   Hypokalemia   Bipolar 1 disorder (HCC)   Aspiration pneumonia (HCC)   Acute respiratory failure (HCC)    Acute respiratory failure with hypoxia (HCC) secondary to   Aspiration pneumonia ([place]) Hypovolemic shock -Initially patient had hypovolemic shock. Has A line in place, still soft BPs. Scheduled and as needed bronchodilators.  I-S/flutter valve.  Continue Solu-Medrol.  Wean off oxygen, now on 4 L nasal cannula.  On midodrine 10 mg TID. Will consult Pulm if needed.      Alcohol abuse with intoxication ([place]) Alcohol withdrawal protocol in place.  Still requiring quite a bit of Ativan.  Placed on Librium taper.  Hypokalemia/hypomagnesemia - Aggressive repletion  Lactic acidosis - IV fluids.  Recheck again.  Alcoholic liver cirrhosis Hepatic encephalopathy - Elevated ammonia level.  Lactulose TID       Generalized anxiety disorder   Bipolar 1 disorder (HCC) Seroquel '50mg'$  qhs ('100mg'$  at home)     GERD (gastroesophageal reflux disease) History of GI bleed Daily PPI     Thrombocytopenia (HCC) Pancytopenia Likely from underlying cirrhosis.  No  evidence of bleeding.  Continue to monitor       DVT prophylaxis: SCDs Start: 06/17/22 0809 Code Status: Full Family Communication:    Status is: Inpatient Remains inpatient appropriate because: Monitor in stepdown due to issues with alcohol withdrawal, soft blood pressure.  A-line removed this morning as manual blood pressures are correlating.  Subjective: Seen and examined at bedside.  Was having some visual hallucination this morning, denies any chest pain, nausea vomiting.  While I was in the room I put down her oxygen to 4 L nasal cannula with good [place] she was saturating greater than 94%.  Examination: Constitutional: Not in acute distress, 4 L nasal cannula Respiratory: Minimal bibasilar crackles Cardiovascular: Normal sinus rhythm, no rubs Abdomen: Nontender nondistended good bowel sounds Musculoskeletal: No edema noted Skin: No rashes seen Neurologic: CN 2-12 grossly intact.  And nonfocal Psychiatric: Normal judgment and insight. Alert and oriented x 3. Normal mood.     Objective: Vitals:   06/20/22 0900 06/20/22 1000 06/20/22 1100 06/20/22 1200  BP: (!) 95/57 112/80 (!) 93/50 117/68  Pulse: 60 63 65 (!) 44  Resp: 17 19 (!) 24 20  Temp:    98 F (36.7 C)  TempSrc:    Oral  SpO2: 94% 94% (!) 88% 93%  Weight:      Height:        Intake/Output Summary (Last 24 hours) at 06/20/2022 1248 Last data filed at 06/20/2022 0600 Gross per 24 hour  Intake 360 ml  Output 750 ml  Net -390 ml   Filed Weights   06/17/22 0857  Weight: 79.8 kg     Data Reviewed:   CBC: Recent Labs  Lab 06/17/22 0330 06/17/22 0401 06/17/22 1726 06/18/22 0432 06/19/22 0500 06/20/22 0500  WBC 4.6  --   --  2.5* 2.7* 3.9*  NEUTROABS 2.0  --   --   --   --   --   HGB 12.5 13.6 10.9* 10.5* 11.2* 12.4  HCT 37.8 40.0 34.6* 32.8* 34.8* 38.1  MCV 88.5  --   --  91.1 89.7 89.0  PLT 90*  --   --  52* 52* 75*   Basic Metabolic Panel: Recent Labs  Lab 06/17/22 0330 06/17/22 0401  06/17/22 1726 06/18/22 0432 06/19/22 0500 06/20/22 0500  NA 143 143 145 142 137 135  K 3.2* 3.3* 4.1 3.9 3.3* 3.8  CL 117* 111 122* 116* 110 110  CO2 17*  --  18* 19* 20* 17*  GLUCOSE 92 88 80 81 95 129*  BUN 7 5* <5* 5* 6 9  CREATININE 0.77 1.10* 0.58 0.47 0.42* 0.38*  CALCIUM 8.6*  --  7.1* 7.6* 8.4* 9.2  MG 2.2  --   --   --  1.9 1.8  PHOS 3.3  --   --   --   --   --    GFR: Estimated Creatinine Clearance: 90.7 mL/min (A) (by C-G formula based on SCr of 0.38 mg/dL (L)). Liver Function Tests: Recent Labs  Lab 06/17/22 0330 06/18/22 0432 06/19/22 0500 06/20/22 0500  AST 52* 45* 39 29  ALT '23 20 19 22  '$ ALKPHOS 86 68 78 91  BILITOT 1.1 1.2 2.0* 1.7*  PROT 7.6 6.2* 6.8 7.7  ALBUMIN 3.6 3.3* 3.6 3.8   Recent Labs  Lab 06/17/22 0330  LIPASE 49   Recent Labs  Lab 06/17/22 0327 06/18/22 0431  AMMONIA 78* 69*   Coagulation Profile: Recent Labs  Lab 06/17/22 0330  INR 1.3*   Cardiac Enzymes: No results for input(s): "CKTOTAL", "CKMB", "CKMBINDEX", "TROPONINI" in the last 168 hours. BNP (last 3 results) No results for input(s): "PROBNP" in the last 8760 hours. HbA1C: No results for input(s): "HGBA1C" in the last 72 hours. CBG: No results for input(s): "GLUCAP" in the last 168 hours. Lipid Profile: No results for input(s): "CHOL", "HDL", "LDLCALC", "TRIG", "CHOLHDL", "LDLDIRECT" in the last 72 hours. Thyroid Function Tests: No results for input(s): "TSH", "T4TOTAL", "FREET4", "T3FREE", "THYROIDAB" in the last 72 hours. Anemia Panel: No results for input(s): "VITAMINB12", "FOLATE", "FERRITIN", "TIBC", "IRON", "RETICCTPCT" in the last 72 hours. Sepsis Labs: Recent Labs  Lab 06/17/22 0326 06/17/22 0526 06/17/22 1927 06/18/22 0828  PROCALCITON  --   --   --  <0.10  LATICACIDVEN 2.4* 1.8 2.8* 0.9    Recent Results (from the past 240 hour(s))  Blood Culture (routine x 2)     Status: None (Preliminary result)   Collection Time: 06/17/22  3:31 AM   Specimen:  BLOOD  Result Value Ref Range Status   Specimen Description   Final    BLOOD BLOOD LEFT FOREARM Performed at [facility], [place] [address], [place], [place] [address]    Special Requests   Final    BOTTLES DRAWN AEROBIC AND ANAEROBIC Blood Culture adequate volume Performed at [facility] [place] [facility] [address] [place], [place] [address]    Culture   Final    NO GROWTH 3 DAYS Performed at [facility], [place] [address] [place], [place] [address]    Report Status PENDING  Incomplete  Blood Culture (routine x 2)     Status: None (Preliminary result)   Collection Time: 06/17/22  3:55 AM   Specimen: BLOOD  Result Value Ref Range Status   Specimen Description   Final    BLOOD BLOOD RIGHT FOREARM Performed at [facility] [address] [place], [place] [address]    Special Requests   Final    BOTTLES DRAWN AEROBIC AND ANAEROBIC Blood Culture results may not be optimal due to an excessive volume of blood received in culture bottles Performed at [facility] [address], [place], [place] [address]    Culture   Final    NO GROWTH 3 DAYS Performed at [facility], [place] [address], [place], [place] [address]    Report Status PENDING  Incomplete  MRSA Next Gen by PCR, Nasal     Status: Abnormal   Collection Time: 06/17/22  8:53 AM   Specimen: Nasal Mucosa; Nasal Swab  Result Value Ref Range Status   MRSA by PCR Next Gen DETECTED (A) NOT DETECTED Final    Comment: (NOTE) The GeneXpert MRSA Assay (FDA approved for NASAL specimens only), is one component of a comprehensive MRSA colonization surveillance program. It is not intended to diagnose MRSA infection nor to guide or monitor treatment for MRSA infections. Test performance is not FDA approved in patients less than 49 years old. Performed at [facility], [place] [address], [place], [place] [address]          Radiology  Studies: ECHOCARDIOGRAM COMPLETE  Result Date: 06/19/2022    ECHOCARDIOGRAM REPORT   Patient Name:   Sara [name] Date of Exam: 06/19/2022 Medical Rec #:  [id number]              Height:       64.0 in Accession #:    [id number]             Weight:       175.9 lb Date of Birth:  11/12/1976               BSA:          1.853 m Patient Age:    63 years               BP:           121/68 mmHg Patient Gender: F                      HR:           68 bpm. Exam Location:  Inpatient Procedure: 2D Echo, 3D Echo, Cardiac Doppler and Color Doppler Indications:    I42.9 Cardiomyopathy (unspecified)  History:        Patient has no prior history of Echocardiogram examinations.                 Signs/Symptoms:Altered Mental Status, Dyspnea, Shortness of                 Breath, Hypotension and Murmur. ETOH.  Sonographer:    Roseanna Rainbow RDCS Referring Phys: [id number] [facility]  Sonographer Comments: Suboptimal parasternal window. IMPRESSIONS  1. Left ventricular ejection fraction, by estimation, is 55 to 60%. The left ventricle has normal function. The left ventricle has no regional wall motion abnormalities. Left ventricular diastolic parameters were normal.  2. Right ventricular systolic function is normal. The right ventricular size is normal. There is normal  pulmonary artery systolic pressure.  3. The mitral valve is normal in structure. No evidence of mitral valve regurgitation.  4. The aortic valve was not well visualized. Aortic valve regurgitation is not visualized.  5. The inferior vena cava is normal in size with greater than 50% respiratory variability, suggesting right atrial pressure of 3 mmHg. Comparison(s): No prior Echocardiogram. FINDINGS  Left Ventricle: Left ventricular ejection fraction, by estimation, is 55 to 60%. The left ventricle has normal function. The left ventricle has no regional wall motion abnormalities. The left ventricular internal cavity size was normal in size. There is  no left  ventricular hypertrophy. Left ventricular diastolic parameters were normal. Right Ventricle: The right ventricular size is normal. Right vetricular wall thickness was not well visualized. Right ventricular systolic function is normal. There is normal pulmonary artery systolic pressure. The tricuspid regurgitant velocity is 2.15 m/s, and with an assumed right atrial pressure of 3 mmHg, the estimated right ventricular systolic pressure is 42.6 mmHg. Left Atrium: Left atrial size was normal in size. Right Atrium: Right atrial size was normal in size. Pericardium: There is no evidence of pericardial effusion. Mitral Valve: The mitral valve is normal in structure. No evidence of mitral valve regurgitation. Tricuspid Valve: The tricuspid valve is normal in structure. Tricuspid valve regurgitation is not demonstrated. Aortic Valve: The aortic valve was not well visualized. Aortic valve regurgitation is not visualized. Pulmonic Valve: The pulmonic valve was not well visualized. Pulmonic valve regurgitation is not visualized. Aorta: The aortic root and ascending aorta are structurally normal, with no evidence of dilitation. Venous: The inferior vena cava is normal in size with greater than 50% respiratory variability, suggesting right atrial pressure of 3 mmHg. IAS/Shunts: The interatrial septum was not well visualized.  LEFT VENTRICLE PLAX 2D LVIDd:         4.90 cm      Diastology LVIDs:         3.10 cm      LV e' medial:    9.25 cm/s LV PW:         1.10 cm      LV E/e' medial:  12.1 LV IVS:        1.00 cm      LV e' lateral:   14.50 cm/s LVOT diam:     2.20 cm      LV E/e' lateral: 7.7 LV SV:         111 LV SV Index:   60 LVOT Area:     3.80 cm                              3D Volume EF: LV Volumes (MOD)            3D EF:        59 % LV vol d, MOD A2C: 169.0 ml LV EDV:       281 ml LV vol d, MOD A4C: 144.0 ml LV ESV:       115 ml LV vol s, MOD A2C: 91.9 ml  LV SV:        165 ml LV vol s, MOD A4C: 64.5 ml LV SV MOD A2C:      77.1 ml LV SV MOD A4C:     144.0 ml LV SV MOD BP:      76.3 ml RIGHT VENTRICLE             IVC RV S prime:  14.10 cm/s  IVC diam: 2.50 cm TAPSE (M-mode): 3.0 cm LEFT ATRIUM             Index        RIGHT ATRIUM           Index LA diam:        2.90 cm 1.57 cm/m   RA Area:     12.20 cm LA Vol (A2C):   58.8 ml 31.74 ml/m  RA Volume:   27.90 ml  15.06 ml/m LA Vol (A4C):   54.8 ml 29.58 ml/m LA Biplane Vol: 61.3 ml 33.09 ml/m  AORTIC VALVE LVOT Vmax:   128.00 cm/s LVOT Vmean:  85.100 cm/s LVOT VTI:    0.293 m  AORTA Ao Root diam: 3.10 cm Ao Asc diam:  3.30 cm MITRAL VALVE                TRICUSPID VALVE MV Area (PHT): 3.71 cm     TR Peak grad:   18.5 mmHg MV Decel Time: 205 msec     TR Vmax:        215.00 cm/s MV E velocity: 111.50 cm/s MV A velocity: 63.85 cm/s   SHUNTS MV E/A ratio:  1.75         Systemic VTI:  0.29 m                             Systemic Diam: 2.20 cm Phineas Inches Electronically signed by Phineas Inches Signature Date/Time: 06/19/2022/11:55:50 AM    Final    DG Chest Port 1 View  Result Date: 06/19/2022 CLINICAL DATA:  Shortness of breath EXAM: PORTABLE CHEST 1 VIEW COMPARISON:  06/17/2022 and prior studies FINDINGS: UPPER limits normal heart size again noted. There is no evidence of focal airspace disease, pulmonary edema, suspicious pulmonary nodule/mass, pleural effusion, or pneumothorax. No acute bony abnormalities are identified. IMPRESSION: No active disease. Electronically Signed   By: Margarette Canada M.D.   On: 06/19/2022 09:31        Scheduled Meds:  budesonide (PULMICORT) nebulizer solution  0.5 mg Nebulization BID   Chlorhexidine Gluconate Cloth  6 each Topical Daily   folic acid  1 mg Oral Daily   gabapentin  400 mg Oral TID   ipratropium-albuterol  3 mL Nebulization TID   lactulose  30 g Oral TID   methylPREDNISolone (SOLU-MEDROL) injection  40 mg Intravenous BID   midodrine  10 mg Oral TID WC   multivitamin with minerals  1 tablet Oral Daily   mupirocin ointment  1  Application Nasal BID   pantoprazole  40 mg Oral Daily   QUEtiapine  50 mg Oral QHS   thiamine  100 mg Oral Daily   Or   thiamine  100 mg Intravenous Daily   topiramate  100 mg Oral BID   Continuous Infusions:  sodium chloride Stopped (06/17/22 1402)   sodium chloride 250 mL (06/17/22 2132)   sodium chloride       LOS: 3 days   Time spent= 35 mins    Sara Urieta Arsenio Loader, MD [facility]  If 7PM-7AM, please contact night-coverage  06/20/2022, 12:48 PM
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
## 2022-06-21 LAB — CBC
HCT: 41.9 % (ref 36.0–46.0)
Hemoglobin: 13.4 g/dL (ref 12.0–15.0)
MCH: 28.9 pg (ref 26.0–34.0)
MCHC: 32 g/dL (ref 30.0–36.0)
MCV: 90.3 fL (ref 80.0–100.0)
Platelets: 75 10*3/uL — ABNORMAL LOW (ref 150–400)
RBC: 4.64 MIL/uL (ref 3.87–5.11)
RDW: 16.3 % — ABNORMAL HIGH (ref 11.5–15.5)
WBC: 4.7 10*3/uL (ref 4.0–10.5)
nRBC: 0 % (ref 0.0–0.2)

## 2022-06-21 LAB — COMPREHENSIVE METABOLIC PANEL
ALT: 22 U/L (ref 0–44)
AST: 28 U/L (ref 15–41)
Albumin: 3.9 g/dL (ref 3.5–5.0)
Alkaline Phosphatase: 87 U/L (ref 38–126)
Anion gap: 9 (ref 5–15)
BUN: 12 mg/dL (ref 6–20)
CO2: 18 mmol/L — ABNORMAL LOW (ref 22–32)
Calcium: 10.3 mg/dL (ref 8.9–10.3)
Chloride: 112 mmol/L — ABNORMAL HIGH (ref 98–111)
Creatinine, Ser: 0.54 mg/dL (ref 0.44–1.00)
GFR, Estimated: >60 mL/min (ref >60)
Glucose, Bld: 125 mg/dL — ABNORMAL HIGH (ref 70–99)
Potassium: 3.6 mmol/L (ref 3.5–5.1)
Sodium: 139 mmol/L (ref 135–145)
Total Bilirubin: 1.3 mg/dL — ABNORMAL HIGH (ref 0.3–1.2)
Total Protein: 7.9 g/dL (ref 6.5–8.1)

## 2022-06-21 LAB — CORTISOL: Cortisol, Plasma: 0.8 ug/dL

## 2022-06-21 LAB — MAGNESIUM: Magnesium: 2 mg/dL (ref 1.7–2.4)

## 2022-06-21 MED ORDER — CALCIUM GLUCONATE-NACL 2-0.675 GM/100ML-% IV SOLN
2.0000 g | Freq: Once | INTRAVENOUS | Status: AC
  Administered 2022-06-21: 2000 mg via INTRAVENOUS
  Filled 2022-06-21: qty 100

## 2022-06-21 NOTE — Consult Note (Signed)
NAME:  Sara [name], MRN:  [id number], DOB:  January 08, 1976, LOS: 4 ADMISSION DATE:  06/17/2022, CONSULTATION DATE:  06/21/22 REFERRING MD:  Reesa Chew, CHIEF COMPLAINT:  lightheadedness   History of Present Illness:  46yF with history of alcoholic cirrhosis decompensated by EV, PSE with multiple admissions for alcohol withdrawal who was admitted 06/17/22 for alcohol intoxication/withdrawal found to be relatively hypotensive. She was fluid resuscitated and started on broad spectrum ABX however there has been no clear source of sepsis with bland UA, CTA chest not suggestive of pneumonia, and abdominal ultrasound today without ascites. Her CBC has been stable here and BUN isn't rising quickly. She has been started on midodrine here and has had prior admissions complicated by hypotension thought due to low SVR state from cirrhosis where midodrine was considered at discharge. She has been started on course of steroids for aspiration pneumonitis 7/30- and in past has had AM cortisols suggestive of at least relative adrenal insufficiency. We are consulted today for hypotension.   Pertinent  Medical History  Alcoholic cirrhosis Obesity Depression TCP  Significant Hospital Events: Including procedures, antibiotic start and stop dates in addition to other pertinent events   06/17/22 admitted, started on zosyn for possible sepsis with unclear source 7/30 zosyn discontinued, steroids started for possible pneumonitis 7/31 midodrine started and titrated up  Interim History / Subjective:    Objective   Blood pressure 95/63, pulse 96, temperature 98 F (36.7 C), temperature source Oral, resp. rate (!) 31, height '5\' 4"'$  (1.626 m), weight 79.8 kg, SpO2 91 %.       No intake or output data in the 24 hours ending 06/21/22 0807 Filed Weights   06/17/22 0857  Weight: 79.8 kg    Examination: General appearance: 46 y.o., female, NAD, drowsy but arousable Eyes: PERRL, tracking appropriately HENT: NCAT;  MMM Lungs: CTAB, no crackles, no wheeze, with normal respiratory effort CV: bradycardic RR, no murmur  Abdomen: Soft, non-tender; non-distended, BS present  Extremities: No peripheral edema, warm, +clubbing Skin: Normal turgor and texture; no rash, +scattered telangiectasias Neuro: attends to conversation with encouragement, grossly nonfocal  Bedside US without ascites  Ammonia 06/18/22 69 BNP 366 Cbc stable BUN/S Cr stable Bicarb 17  BCx 06/17/22 NGTD  Procal 06/18/22 <10  TTE 06/19/22 normal, interpretation limited by poor parasternal window  Resolved Hospital Problem list    Assessment & Plan:   # Hypotension: Likely predominantly due to distributive state from her decompensated cirrhosis. - will monitor pressures after changing cuff from large to regular size  - continue midodrine 10 TID - this has been considered at discharge at prior admissions where hypotension attributed to low SVR state from cirrhosis - would taper steroids over next 5-7 days - there has been question of at least relative adrenal insufficiency in past though cirrhosis complicates interpretation of HPA axis testing - agree that there's no clear source of sepsis/infection, would hold ABX - monitor cbc at least daily to watch for bleeding - if she continues to be hypotensive despite above measures with MAP sustained <60, check lactic. If lactic elevated would start levophed. May need to consider RHC to look for PH not seen on surface TTE  # Acute toxic, metabolic encephalopathy With PSE, sedating medications required for management of her withdrawal/intoxication - CIWA-based ativan - lactulose   # Acute hypoxic respiratory failure Seems to have sleep disordered breathing and atelectasis - PT/OT, mobilize, get in chair position at least during the day - frequent IS - sleep study  as outpatient - wean O2 for FiO2 92% - if bp improves, trial of diuretic  # Cirrhosis - lactulose as above - thiamine  #  GAD, BiPD - seroquel per primary  # GERD - home ppi  # TCP - CTM  Best Practice (right click and "Reselect all SmartList Selections" daily)   Per primary  Labs   CBC: Recent Labs  Lab 06/17/22 0330 06/17/22 0401 06/17/22 1726 06/18/22 0432 06/19/22 0500 06/20/22 0500  WBC 4.6  --   --  2.5* 2.7* 3.9*  NEUTROABS 2.0  --   --   --   --   --   HGB 12.5 13.6 10.9* 10.5* 11.2* 12.4  HCT 37.8 40.0 34.6* 32.8* 34.8* 38.1  MCV 88.5  --   --  91.1 89.7 89.0  PLT 90*  --   --  52* 52* 75*    Basic Metabolic Panel: Recent Labs  Lab 06/17/22 0330 06/17/22 0401 06/17/22 1726 06/18/22 0432 06/19/22 0500 06/20/22 0500  NA 143 143 145 142 137 135  K 3.2* 3.3* 4.1 3.9 3.3* 3.8  CL 117* 111 122* 116* 110 110  CO2 17*  --  18* 19* 20* 17*  GLUCOSE 92 88 80 81 95 129*  BUN 7 5* <5* 5* 6 9  CREATININE 0.77 1.10* 0.58 0.47 0.42* 0.38*  CALCIUM 8.6*  --  7.1* 7.6* 8.4* 9.2  MG 2.2  --   --   --  1.9 1.8  PHOS 3.3  --   --   --   --   --    GFR: Estimated Creatinine Clearance: 90.7 mL/min (A) (by C-G formula based on SCr of 0.38 mg/dL (L)). Recent Labs  Lab 06/17/22 0326 06/17/22 0330 06/17/22 0526 06/17/22 1927 06/18/22 0432 06/18/22 0828 06/19/22 0500 06/20/22 0500  PROCALCITON  --   --   --   --   --  <0.10  --   --   WBC  --  4.6  --   --  2.5*  --  2.7* 3.9*  LATICACIDVEN 2.4*  --  1.8 2.8*  --  0.9  --   --     Liver Function Tests: Recent Labs  Lab 06/17/22 0330 06/18/22 0432 06/19/22 0500 06/20/22 0500  AST 52* 45* 39 29  ALT '23 20 19 22  '$ ALKPHOS 86 68 78 91  BILITOT 1.1 1.2 2.0* 1.7*  PROT 7.6 6.2* 6.8 7.7  ALBUMIN 3.6 3.3* 3.6 3.8   Recent Labs  Lab 06/17/22 0330  LIPASE 49   Recent Labs  Lab 06/17/22 0327 06/18/22 0431  AMMONIA 78* 69*    ABG    Component Value Date/Time   PHART 7.34 (L) 06/17/2022 2056   PCO2ART 33 06/17/2022 2056   PO2ART 71 (L) 06/17/2022 2056   HCO3 17.9 (L) 06/17/2022 2056   TCO2 16 (L) 06/17/2022 0401    ACIDBASEDEF 7.2 (H) 06/17/2022 2056   O2SAT 97.1 06/17/2022 2056     Coagulation Profile: Recent Labs  Lab 06/17/22 0330  INR 1.3*    Cardiac Enzymes: No results for input(s): "CKTOTAL", "CKMB", "CKMBINDEX", "TROPONINI" in the last 168 hours.  HbA1C: Hgb A1c MFr Bld  Date/Time Value Ref Range Status  06/06/2022 10:01 PM 4.9 4.8 - 5.6 % Final    Comment:    (NOTE) Pre diabetes:          5.7%-6.4%  Diabetes:              >6.4%  Glycemic control for   <7.0% adults with diabetes   04/10/2022 06:48 PM 4.9 4.8 - 5.6 % Final    Comment:    (NOTE) Pre diabetes:          5.7%-6.4%  Diabetes:              >6.4%  Glycemic control for   <7.0% adults with diabetes     CBG: No results for input(s): "GLUCAP" in the last 168 hours.  Review of Systems:   12 point review of systems is negative except as in HPI  Past Medical History:  She,  has a past medical history of Alcohol abuse, Alcoholism ([place]), Anemia, Anxiety, Blood transfusion without reported diagnosis, Cirrhosis ([place]), Depression, Esophageal varices with bleeding(456.0) (06/13/2014), GERD (gastroesophageal reflux disease), Heart murmur, Menorrhagia, Pancytopenia ([place]) (01/15/2014), Pneumonia, Portal hypertension ([place]), S/P alcohol detoxification, and UGI bleed (06/12/2014).   Surgical History:   Past Surgical History:  Procedure Laterality Date   CHOLECYSTECTOMY     ESOPHAGOGASTRODUODENOSCOPY N/A 06/12/2014   Procedure: ESOPHAGOGASTRODUODENOSCOPY (EGD);  Surgeon: Gatha Mayer, MD;  Location: [facility];  Service: Endoscopy;  Laterality: N/A;   ESOPHAGOGASTRODUODENOSCOPY (EGD) WITH PROPOFOL N/A 07/29/2014   Procedure: ESOPHAGOGASTRODUODENOSCOPY (EGD) WITH PROPOFOL;  Surgeon: Inda Castle, MD;  Location: [facility];  Service: Endoscopy;  Laterality: N/A;   ESOPHAGOGASTRODUODENOSCOPY (EGD) WITH PROPOFOL N/A 01/20/2018   Procedure: ESOPHAGOGASTRODUODENOSCOPY (EGD) WITH PROPOFOL;  Surgeon: Mauri Pole, MD;   Location: [facility];  Service: Endoscopy;  Laterality: N/A;     Social History:   reports that she quit smoking about 3 years ago. Her smoking use included cigarettes. She smoked an average of .25 packs per day. She has never used smokeless tobacco. She reports current alcohol use. She reports that she does not use drugs.   Family History:  Her family history includes Alcohol abuse in her maternal grandfather, maternal uncle, paternal aunt, and paternal grandfather; Alcoholism in her father and mother; Colon polyps in her mother; Hypertension in her mother; Thyroid disease in her mother.   Allergies Allergies  Allergen Reactions   Gluten Meal Other (See Comments)    Due to celiac disease - bloated, stomach issues    Morphine And Related Other (See Comments)    Slowed HR, lowered BP   Nsaids Other (See Comments)    Caused internal bleeding     Home Medications  Prior to Admission medications   Medication Sig Start Date End Date Taking? Authorizing Provider  benzocaine (ORAJEL) 10 % mucosal gel Use as directed in the mouth or throat 3 (three) times daily as needed for mouth pain. Patient taking differently: Use as directed 1 Application in the mouth or throat 3 (three) times daily as needed for mouth pain. 04/14/22  Yes Massengill, Ovid Curd, MD  escitalopram (LEXAPRO) 20 MG tablet Take 1 tablet (20 mg total) by mouth once daily. 05/03/22  Yes Nwoko, Uchenna E, PA  gabapentin (NEURONTIN) 400 MG capsule Take 1 capsule (400 mg total) by mouth 3 (three) times daily. 05/03/22  Yes Nwoko, Terese Door, PA  hydrOXYzine (ATARAX) 50 MG tablet Take 1 tablet (50 mg total) by mouth 3 (three) times daily as needed for anxiety. 05/03/22  Yes Nwoko, Terese Door, PA  Multiple Vitamin (MULTIVITAMIN WITH MINERALS) TABS tablet Take 1 tablet by mouth daily.   Yes [provider]  prazosin (MINIPRESS) 1 MG capsule Take 1 capsule (1 mg total) by mouth once nightly at bedtime. 05/03/22  Yes Nwoko, Terese Door, PA  QUEtiapine (SEROQUEL) 100 MG tablet Take 1 tablet (100 mg total) by mouth once nightly at bedtime. 05/03/22  Yes Nwoko, Terese Door, PA  topiramate (TOPAMAX) 100 MG tablet Take 1 tablet (100 mg total) by mouth 2 (two) times daily. 05/03/22  Yes Nwoko, Terese Door, PA  chlordiazePOXIDE (LIBRIUM) 25 MG capsule '50mg'$  PO TID x 1D, then 25-'50mg'$  PO BID X 1D, then 25-'50mg'$  PO QD X 1D Patient not taking: Reported on 06/17/2022 06/07/22   Smoot, Leary Roca, PA-C  ramelteon (ROZEREM) 8 MG tablet Take 1 tablet (8 mg total) by mouth once nightly at bedtime. Patient not taking: Reported on 06/17/2022 05/03/22   Ileene Musa E, PA  pantoprazole (PROTONIX) 40 MG tablet Take 1 tablet (40 mg total) by mouth daily. 09/14/20 01/21/21  Antony Blackbird, MD     Critical care time: n/a

## 2022-06-21 NOTE — Progress Notes (Signed)
PROGRESS NOTE    Sara [name]  VEL:[id number] DOB: 11-30-1975 DOA: 06/17/2022 PCP: Pcp, No   Brief Narrative:  46 year old with history of alcohol abuse, cirrhosis, multiple admissions for alcohol detox, upper GI bleed, anemia, anxiety, depression, PTSD, pancytopenia, GERD comes to the ED by GDP due to alcohol intoxication.  She drinks about 10 beers and about 1-2 bottles of wine daily.  Initially noted to be hypotensive and hypoxic requiring IV fluids.  UA showed mild urinary tract infection, ammonia level 78, alcohol level 289, potassium 3.2.  CTA chest was negative.   Assessment & Plan:  Principal Problem:   Acute respiratory failure with hypoxia (HCC) Active Problems:   Generalized anxiety disorder   GERD (gastroesophageal reflux disease)   Alcohol abuse with intoxication (HCC)   Thrombocytopenia (HCC)   Hypotension   Hypokalemia   Bipolar 1 disorder (HCC)   Aspiration pneumonia (HCC)   Acute respiratory failure (HCC)   Acute respiratory failure with hypoxia ([place]) secondary to Aspiration pneumonitis (HCC) Hypovolemic shock -Initially patient had hypovolemic shock. Has A line in place, still soft BPs. Scheduled and as needed bronchodilators.  I-S/flutter valve.  Continue Solu-Medrol.  Wean off oxygen, continue midodrine 10 mg 3 times daily.  Echocardiogram unremarkable. Seen by Pulm/      Alcohol abuse with intoxication ([place]) Alcohol withdrawal protocol in place.  Still requiring quite a bit of Ativan.  Completed librium taper. Husband just mentioned, ptn at times has been using hand sanitizer as well for the past 2 yrs.   Hypokalemia/hypomagnesemia - Aggressive repletion  Lactic acidosis -Resolved with fluids  Alcoholic liver cirrhosis Hepatic encephalopathy - Elevated ammonia level.  Lactulose TID       Generalized anxiety disorder   Bipolar 1 disorder (HCC) Seroquel '50mg'$  qhs ('100mg'$  at home)     GERD (gastroesophageal reflux disease) History of GI  bleed Daily PPI     Thrombocytopenia (HCC) Pancytopenia Likely from underlying cirrhosis.  No evidence of bleeding.  Continue to monitor       DVT prophylaxis: SCDs Start: 06/17/22 0809 Code Status: Full Family Communication:    Status is: Inpatient Remains inpatient appropriate because: Continues to have soft BP and requiring O2.   Subjective: BP slightly better during my visit, it was SBP 100's asymptomatic. She was sating 98% on 4L [place].  No other complaints.   Examination: Constitutional: Not in acute distress Respiratory: Clear to auscultation bilaterally Cardiovascular: Normal sinus rhythm, no rubs Abdomen: Nontender nondistended good bowel sounds Musculoskeletal: No edema noted Skin: angiodysplagia on the chest wall.  Neurologic: CN 2-12 grossly intact.  And nonfocal Psychiatric: Normal judgment and insight. Alert and oriented x 3. Normal mood.   Objective: Vitals:   06/21/22 0500 06/21/22 0530 06/21/22 0535 06/21/22 0600  BP: (!) 85/40 (!) 70/36 (!) 93/52 95/63  Pulse: (!) 44 (!) 51 (!) 46 96  Resp: 20 18 (!) 22 (!) 31  Temp:      TempSrc:      SpO2: 96% 95% 97% 91%  Weight:      Height:        Intake/Output Summary (Last 24 hours) at 06/21/2022 0717 Last data filed at 06/20/2022 0800 Gross per 24 hour  Intake 247.12 ml  Output --  Net 247.12 ml   Filed Weights   06/17/22 0857  Weight: 79.8 kg     Data Reviewed:   CBC: Recent Labs  Lab 06/17/22 0330 06/17/22 0401 06/17/22 1726 06/18/22 0432 06/19/22 0500 06/20/22 0500  WBC 4.6  --   --  2.5* 2.7* 3.9*  NEUTROABS 2.0  --   --   --   --   --   HGB 12.5 13.6 10.9* 10.5* 11.2* 12.4  HCT 37.8 40.0 34.6* 32.8* 34.8* 38.1  MCV 88.5  --   --  91.1 89.7 89.0  PLT 90*  --   --  52* 52* 75*   Basic Metabolic Panel: Recent Labs  Lab 06/17/22 0330 06/17/22 0401 06/17/22 1726 06/18/22 0432 06/19/22 0500 06/20/22 0500  NA 143 143 145 142 137 135  K 3.2* 3.3* 4.1 3.9 3.3* 3.8  CL 117* 111 122*  116* 110 110  CO2 17*  --  18* 19* 20* 17*  GLUCOSE 92 88 80 81 95 129*  BUN 7 5* <5* 5* 6 9  CREATININE 0.77 1.10* 0.58 0.47 0.42* 0.38*  CALCIUM 8.6*  --  7.1* 7.6* 8.4* 9.2  MG 2.2  --   --   --  1.9 1.8  PHOS 3.3  --   --   --   --   --    GFR: Estimated Creatinine Clearance: 90.7 mL/min (A) (by C-G formula based on SCr of 0.38 mg/dL (L)). Liver Function Tests: Recent Labs  Lab 06/17/22 0330 06/18/22 0432 06/19/22 0500 06/20/22 0500  AST 52* 45* 39 29  ALT '23 20 19 22  '$ ALKPHOS 86 68 78 91  BILITOT 1.1 1.2 2.0* 1.7*  PROT 7.6 6.2* 6.8 7.7  ALBUMIN 3.6 3.3* 3.6 3.8   Recent Labs  Lab 06/17/22 0330  LIPASE 49   Recent Labs  Lab 06/17/22 0327 06/18/22 0431  AMMONIA 78* 69*   Coagulation Profile: Recent Labs  Lab 06/17/22 0330  INR 1.3*   Cardiac Enzymes: No results for input(s): "CKTOTAL", "CKMB", "CKMBINDEX", "TROPONINI" in the last 168 hours. BNP (last 3 results) No results for input(s): "PROBNP" in the last 8760 hours. HbA1C: No results for input(s): "HGBA1C" in the last 72 hours. CBG: No results for input(s): "GLUCAP" in the last 168 hours. Lipid Profile: No results for input(s): "CHOL", "HDL", "LDLCALC", "TRIG", "CHOLHDL", "LDLDIRECT" in the last 72 hours. Thyroid Function Tests: No results for input(s): "TSH", "T4TOTAL", "FREET4", "T3FREE", "THYROIDAB" in the last 72 hours. Anemia Panel: No results for input(s): "VITAMINB12", "FOLATE", "FERRITIN", "TIBC", "IRON", "RETICCTPCT" in the last 72 hours. Sepsis Labs: Recent Labs  Lab 06/17/22 0326 06/17/22 0526 06/17/22 1927 06/18/22 0828  PROCALCITON  --   --   --  <0.10  LATICACIDVEN 2.4* 1.8 2.8* 0.9    Recent Results (from the past 240 hour(s))  Blood Culture (routine x 2)     Status: None (Preliminary result)   Collection Time: 06/17/22  3:31 AM   Specimen: BLOOD  Result Value Ref Range Status   Specimen Description   Final    BLOOD BLOOD LEFT FOREARM Performed at [facility], [place] [address] [place], [place] [address]    Special Requests   Final    BOTTLES DRAWN AEROBIC AND ANAEROBIC Blood Culture adequate volume Performed at [place] [address], [place], [place] [address]    Culture   Final    NO GROWTH 3 DAYS Performed at [facility], [place] [address], [place], [place] [address]    Report Status PENDING  Incomplete  Blood Culture (routine x 2)     Status: None (Preliminary result)   Collection Time: 06/17/22  3:55 AM   Specimen: BLOOD  Result Value Ref Range Status   Specimen  Description   Final    BLOOD BLOOD RIGHT FOREARM Performed at [place] [address], [place], [place] [address]    Special Requests   Final    BOTTLES DRAWN AEROBIC AND ANAEROBIC Blood Culture results may not be optimal due to an excessive volume of blood received in culture bottles Performed at [place] [address] [place], [place] [address]    Culture   Final    NO GROWTH 3 DAYS Performed at [facility], [place] [address], [place], [place] [address]    Report Status PENDING  Incomplete  MRSA Next Gen by PCR, Nasal     Status: Abnormal   Collection Time: 06/17/22  8:53 AM   Specimen: Nasal Mucosa; Nasal Swab  Result Value Ref Range Status   MRSA by PCR Next Gen DETECTED (A) NOT DETECTED Final    Comment: (NOTE) The GeneXpert MRSA Assay (FDA approved for NASAL specimens only), is one component of a comprehensive MRSA colonization surveillance program. It is not intended to diagnose MRSA infection nor to guide or monitor treatment for MRSA infections. Test performance is not FDA approved in patients less than 57 years old. Performed at [facility], [place] [address], [place], [place] [address]          Radiology Studies: ECHOCARDIOGRAM COMPLETE  Result Date: 06/19/2022    ECHOCARDIOGRAM REPORT   Patient Name:   Sara [name] Date of Exam:  06/19/2022 Medical Rec #:  [id number]              Height:       64.0 in Accession #:    [id number]             Weight:       175.9 lb Date of Birth:  May 24, 1976               BSA:          1.853 m Patient Age:    16 years               BP:           121/68 mmHg Patient Gender: F                      HR:           68 bpm. Exam Location:  Inpatient Procedure: 2D Echo, 3D Echo, Cardiac Doppler and Color Doppler Indications:    I42.9 Cardiomyopathy (unspecified)  History:        Patient has no prior history of Echocardiogram examinations.                 Signs/Symptoms:Altered Mental Status, Dyspnea, Shortness of                 Breath, Hypotension and Murmur. ETOH.  Sonographer:    Roseanna Rainbow RDCS Referring Phys: [id number] [facility]  Sonographer Comments: Suboptimal parasternal window. IMPRESSIONS  1. Left ventricular ejection fraction, by estimation, is 55 to 60%. The left ventricle has normal function. The left ventricle has no regional wall motion abnormalities. Left ventricular diastolic parameters were normal.  2. Right ventricular systolic function is normal. The right ventricular size is normal. There is normal pulmonary artery systolic pressure.  3. The mitral valve is normal in structure. No evidence of mitral valve regurgitation.  4. The aortic valve was not well visualized. Aortic valve regurgitation is not visualized.  5.  The inferior vena cava is normal in size with greater than 50% respiratory variability, suggesting right atrial pressure of 3 mmHg. Comparison(s): No prior Echocardiogram. FINDINGS  Left Ventricle: Left ventricular ejection fraction, by estimation, is 55 to 60%. The left ventricle has normal function. The left ventricle has no regional wall motion abnormalities. The left ventricular internal cavity size was normal in size. There is  no left ventricular hypertrophy. Left ventricular diastolic parameters were normal. Right Ventricle: The right ventricular size is normal. Right vetricular  wall thickness was not well visualized. Right ventricular systolic function is normal. There is normal pulmonary artery systolic pressure. The tricuspid regurgitant velocity is 2.15 m/s, and with an assumed right atrial pressure of 3 mmHg, the estimated right ventricular systolic pressure is 16.1 mmHg. Left Atrium: Left atrial size was normal in size. Right Atrium: Right atrial size was normal in size. Pericardium: There is no evidence of pericardial effusion. Mitral Valve: The mitral valve is normal in structure. No evidence of mitral valve regurgitation. Tricuspid Valve: The tricuspid valve is normal in structure. Tricuspid valve regurgitation is not demonstrated. Aortic Valve: The aortic valve was not well visualized. Aortic valve regurgitation is not visualized. Pulmonic Valve: The pulmonic valve was not well visualized. Pulmonic valve regurgitation is not visualized. Aorta: The aortic root and ascending aorta are structurally normal, with no evidence of dilitation. Venous: The inferior vena cava is normal in size with greater than 50% respiratory variability, suggesting right atrial pressure of 3 mmHg. IAS/Shunts: The interatrial septum was not well visualized.  LEFT VENTRICLE PLAX 2D LVIDd:         4.90 cm      Diastology LVIDs:         3.10 cm      LV e' medial:    9.25 cm/s LV PW:         1.10 cm      LV E/e' medial:  12.1 LV IVS:        1.00 cm      LV e' lateral:   14.50 cm/s LVOT diam:     2.20 cm      LV E/e' lateral: 7.7 LV SV:         111 LV SV Index:   60 LVOT Area:     3.80 cm                              3D Volume EF: LV Volumes (MOD)            3D EF:        59 % LV vol d, MOD A2C: 169.0 ml LV EDV:       281 ml LV vol d, MOD A4C: 144.0 ml LV ESV:       115 ml LV vol s, MOD A2C: 91.9 ml  LV SV:        165 ml LV vol s, MOD A4C: 64.5 ml LV SV MOD A2C:     77.1 ml LV SV MOD A4C:     144.0 ml LV SV MOD BP:      76.3 ml RIGHT VENTRICLE             IVC RV S prime:     14.10 cm/s  IVC diam: 2.50 cm TAPSE  (M-mode): 3.0 cm LEFT ATRIUM             Index  RIGHT ATRIUM           Index LA diam:        2.90 cm 1.57 cm/m   RA Area:     12.20 cm LA Vol (A2C):   58.8 ml 31.74 ml/m  RA Volume:   27.90 ml  15.06 ml/m LA Vol (A4C):   54.8 ml 29.58 ml/m LA Biplane Vol: 61.3 ml 33.09 ml/m  AORTIC VALVE LVOT Vmax:   128.00 cm/s LVOT Vmean:  85.100 cm/s LVOT VTI:    0.293 m  AORTA Ao Root diam: 3.10 cm Ao Asc diam:  3.30 cm MITRAL VALVE                TRICUSPID VALVE MV Area (PHT): 3.71 cm     TR Peak grad:   18.5 mmHg MV Decel Time: 205 msec     TR Vmax:        215.00 cm/s MV E velocity: 111.50 cm/s MV A velocity: 63.85 cm/s   SHUNTS MV E/A ratio:  1.75         Systemic VTI:  0.29 m                             Systemic Diam: 2.20 cm Phineas Inches Electronically signed by Phineas Inches Signature Date/Time: 06/19/2022/11:55:50 AM    Final    DG Chest Port 1 View  Result Date: 06/19/2022 CLINICAL DATA:  Shortness of breath EXAM: PORTABLE CHEST 1 VIEW COMPARISON:  06/17/2022 and prior studies FINDINGS: UPPER limits normal heart size again noted. There is no evidence of focal airspace disease, pulmonary edema, suspicious pulmonary nodule/mass, pleural effusion, or pneumothorax. No acute bony abnormalities are identified. IMPRESSION: No active disease. Electronically Signed   By: Margarette Canada M.D.   On: 06/19/2022 09:31        Scheduled Meds:  budesonide (PULMICORT) nebulizer solution  0.5 mg Nebulization BID   Chlorhexidine Gluconate Cloth  6 each Topical Daily   folic acid  1 mg Oral Daily   gabapentin  400 mg Oral TID   ipratropium-albuterol  3 mL Nebulization BID   lactulose  30 g Oral TID   methylPREDNISolone (SOLU-MEDROL) injection  40 mg Intravenous BID   midodrine  10 mg Oral TID WC   multivitamin with minerals  1 tablet Oral Daily   mupirocin ointment  1 Application Nasal BID   pantoprazole  40 mg Oral Daily   QUEtiapine  50 mg Oral QHS   thiamine  100 mg Oral Daily   Or   thiamine  100 mg  Intravenous Daily   topiramate  100 mg Oral BID   Continuous Infusions:  sodium chloride Stopped (06/19/22 0713)   sodium chloride 250 mL (06/17/22 2132)   sodium chloride       LOS: 4 days   Time spent= 35 mins    Amador Braddy Arsenio Loader, MD [facility]  If 7PM-7AM, please contact night-coverage  06/21/2022, 7:17 AM

## 2022-06-21 NOTE — Progress Notes (Signed)
Pt has telesitter. Pt attempting to get up. Telesitter notified me of pt trying to get up. Pt told RN that she was getting a tattoo.Pt told she was in the hospital. Pt was saying we are outside & we need to get it. Asked pt what she was trying to get. Pt unable to tell me & just kept repeating we need to get it. Asked pt orientation questions. Pt able to answer all of them correctly. Explained to pt we are in the hospital & she has been here for a few days. Pt calmed down after a few mins. Dr. Reesa Chew notified & important for pt to continue to take lactulose to lower risk of confusion. Pt stable at this time.

## 2022-06-22 LAB — CBC
HCT: 39.5 % (ref 36.0–46.0)
Hemoglobin: 13 g/dL (ref 12.0–15.0)
MCH: 29.3 pg (ref 26.0–34.0)
MCHC: 32.9 g/dL (ref 30.0–36.0)
MCV: 89.2 fL (ref 80.0–100.0)
Platelets: 95 10*3/uL — ABNORMAL LOW (ref 150–400)
RBC: 4.43 MIL/uL (ref 3.87–5.11)
RDW: 16.2 % — ABNORMAL HIGH (ref 11.5–15.5)
WBC: 4.8 10*3/uL (ref 4.0–10.5)
nRBC: 0 % (ref 0.0–0.2)

## 2022-06-22 LAB — CULTURE, BLOOD (ROUTINE X 2)
Culture: NO GROWTH
Culture: NO GROWTH
Special Requests: ADEQUATE

## 2022-06-22 LAB — COMPREHENSIVE METABOLIC PANEL
ALT: 19 U/L (ref 0–44)
AST: 24 U/L (ref 15–41)
Albumin: 3.7 g/dL (ref 3.5–5.0)
Alkaline Phosphatase: 82 U/L (ref 38–126)
Anion gap: 7 (ref 5–15)
BUN: 11 mg/dL (ref 6–20)
CO2: 20 mmol/L — ABNORMAL LOW (ref 22–32)
Calcium: 9.3 mg/dL (ref 8.9–10.3)
Chloride: 110 mmol/L (ref 98–111)
Creatinine, Ser: 0.55 mg/dL (ref 0.44–1.00)
GFR, Estimated: >60 mL/min (ref >60)
Glucose, Bld: 154 mg/dL — ABNORMAL HIGH (ref 70–99)
Potassium: 3.5 mmol/L (ref 3.5–5.1)
Sodium: 137 mmol/L (ref 135–145)
Total Bilirubin: 1.3 mg/dL — ABNORMAL HIGH (ref 0.3–1.2)
Total Protein: 7.6 g/dL (ref 6.5–8.1)

## 2022-06-22 LAB — MAGNESIUM: Magnesium: 1.9 mg/dL (ref 1.7–2.4)

## 2022-06-22 LAB — LACTIC ACID, PLASMA: Lactic Acid, Venous: 1.2 mmol/L (ref 0.5–1.9)

## 2022-06-22 LAB — AMMONIA: Ammonia: 71 umol/L — ABNORMAL HIGH (ref 9–35)

## 2022-06-22 MED ORDER — PREDNISONE 10 MG PO TABS
10.0000 mg | ORAL_TABLET | Freq: Every day | ORAL | Status: DC
Start: 2022-06-25 — End: 2022-06-22

## 2022-06-22 MED ORDER — MAGNESIUM SULFATE 2 GM/50ML IV SOLN
2.0000 g | Freq: Once | INTRAVENOUS | Status: AC
  Administered 2022-06-22: 2 g via INTRAVENOUS
  Filled 2022-06-22: qty 50

## 2022-06-22 MED ORDER — ALBUMIN HUMAN 5 % IV SOLN
12.5000 g | Freq: Once | INTRAVENOUS | Status: AC
  Administered 2022-06-22: 12.5 g via INTRAVENOUS
  Filled 2022-06-22: qty 250

## 2022-06-22 MED ORDER — POTASSIUM CHLORIDE CRYS ER 20 MEQ PO TBCR
40.0000 meq | EXTENDED_RELEASE_TABLET | Freq: Once | ORAL | Status: AC
  Administered 2022-06-22: 40 meq via ORAL
  Filled 2022-06-22: qty 2

## 2022-06-22 MED ORDER — PREDNISONE 20 MG PO TABS: 20.0000 mg | ORAL_TABLET | Freq: Every day | ORAL | Status: DC

## 2022-06-22 MED ORDER — PREDNISONE 20 MG PO TABS
40.0000 mg | ORAL_TABLET | Freq: Every day | ORAL | Status: AC
  Administered 2022-06-22: 40 mg via ORAL
  Filled 2022-06-22: qty 2

## 2022-06-22 MED ORDER — PREDNISONE 20 MG PO TABS: 30.0000 mg | ORAL_TABLET | Freq: Every day | ORAL | Status: DC

## 2022-06-22 MED ORDER — HYDROCORTISONE 20 MG PO TABS
50.0000 mg | ORAL_TABLET | Freq: Two times a day (BID) | ORAL | Status: DC
  Administered 2022-06-22 – 2022-06-25 (×7): 50 mg via ORAL
  Filled 2022-06-22 (×7): qty 1

## 2022-06-22 MED ORDER — LIP MEDEX EX OINT
1.0000 | TOPICAL_OINTMENT | CUTANEOUS | Status: DC | PRN
  Administered 2022-06-22: 1 via TOPICAL
  Filled 2022-06-22: qty 7

## 2022-06-22 MED ORDER — ALBUMIN HUMAN 5 % IV SOLN: 12.5000 g | Freq: Once | INTRAVENOUS | Status: DC

## 2022-06-22 NOTE — Progress Notes (Signed)
PROGRESS NOTE    Sara [name]  DQQ:[id number] DOB: 1976/06/04 DOA: 06/17/2022 PCP: Pcp, No   Brief Narrative:  46 year old with history of alcohol abuse, cirrhosis, multiple admissions for alcohol detox, upper GI bleed, anemia, anxiety, depression, PTSD, pancytopenia, GERD comes to the ED by [facility] due to alcohol intoxication.  She drinks about 10 beers and about 1-2 bottles of wine daily.  Initially noted to be hypotensive and hypoxic requiring IV fluids.  UA showed mild urinary tract infection, ammonia level 78, alcohol level 289, potassium 3.2.  CTA chest was negative.  Echocardiogram overall is unremarkable, getting midodrine to help with blood pressures.  Pulmonary team consulted for their input as well.   Assessment & Plan:  Principal Problem:   Acute respiratory failure with hypoxia (HCC) Active Problems:   Generalized anxiety disorder   GERD (gastroesophageal reflux disease)   Alcohol abuse with intoxication (HCC)   Thrombocytopenia (HCC)   Hypotension   Hypokalemia   Bipolar 1 disorder (HCC)   Aspiration pneumonia (HCC)   Acute respiratory failure (HCC)   Acute respiratory failure with hypoxia ([place]) secondary to Aspiration pneumonitis (HCC) Hypovolemic shock with some concerns of adrenal insuff.  -Initially patient had hypovolemic shock. Has A line in place, still soft BPs. Scheduled and as needed bronchodilators.  I-S/flutter valve.   Wean off oxygen, continue midodrine 10 mg 3 times daily.  Echocardiogram unremarkable.  Appreciate input from pulmonary team.  We will discuss possible need for right heart cath. Blood pressure still remains soft with some concerns of adrenal Insuff. Will start with Hydrocortisone '50mg'$  bid, and add Florinef If needed.      Alcohol abuse with intoxication ([place]) Monitor for alcohol withdrawal.  Completed Librium taper.  Can get as needed IV Ativan if necessary.  Husband did mention that she has using sanitizer in the  past  Hypokalemia/hypomagnesemia - Aggressive repletion  Lactic acidosis -Resolved with fluids  Alcoholic liver cirrhosis Hepatic encephalopathy - Elevated ammonia level.  Lactulose TID, off-and-on refusing lactulose       Generalized anxiety disorder   Bipolar 1 disorder (HCC) Seroquel '50mg'$  qhs ('100mg'$  at home)     GERD (gastroesophageal reflux disease) History of GI bleed Daily PPI     Thrombocytopenia (HCC) Pancytopenia Likely from underlying cirrhosis.  No evidence of bleeding.  Continue to monitor       DVT prophylaxis: SCDs Start: 06/17/22 0809 Code Status: Full Family Communication:    Status is: Inpatient Remains inpatient appropriate because: Continues to have soft BP and requiring O2.   Subjective: Sitting up in the chair, no complaints this morning.  Good use of incentive spirometer and flutter valve.  Examination: Constitutional: Not in acute distress Respiratory: Clear to auscultation bilaterally Cardiovascular: Normal sinus rhythm, no rubs Abdomen: Nontender nondistended good bowel sounds Musculoskeletal: No edema noted Skin: Angiodysplasia noted on the chest wall, chronic. Neurologic: CN 2-12 grossly intact.  And nonfocal Psychiatric: Normal judgment and insight. Alert and oriented x 3. Normal mood.  Objective: Vitals:   06/22/22 0500 06/22/22 0600 06/22/22 0604 06/22/22 0606  BP: (!) 92/55 (!) 83/42 (!) 75/42 (!) 69/42  Pulse: (!) 54 (!) 57 74 (!) 57  Resp: '19 19 19 17  '$ Temp:      TempSrc:      SpO2: 93% 94% 95% 94%  Weight:      Height:        Intake/Output Summary (Last 24 hours) at 06/22/2022 0728 Last data filed at 06/22/2022 0519 Gross per 24 hour  Intake 543.43 ml  Output --  Net 543.43 ml   Filed Weights   06/17/22 0857  Weight: 79.8 kg     Data Reviewed:   CBC: Recent Labs  Lab 06/17/22 0330 06/17/22 0401 06/18/22 0432 06/19/22 0500 06/20/22 0500 06/21/22 0946 06/22/22 0229  WBC 4.6  --  2.5* 2.7* 3.9* 4.7 4.8   NEUTROABS 2.0  --   --   --   --   --   --   HGB 12.5   < > 10.5* 11.2* 12.4 13.4 13.0  HCT 37.8   < > 32.8* 34.8* 38.1 41.9 39.5  MCV 88.5  --  91.1 89.7 89.0 90.3 89.2  PLT 90*  --  52* 52* 75* 75* 95*   < > = values in this interval not displayed.   Basic Metabolic Panel: Recent Labs  Lab 06/17/22 0330 06/17/22 0401 06/18/22 0432 06/19/22 0500 06/20/22 0500 06/21/22 0946 06/22/22 0229  NA 143   < > 142 137 135 139 137  K 3.2*   < > 3.9 3.3* 3.8 3.6 3.5  CL 117*   < > 116* 110 110 112* 110  CO2 17*   < > 19* 20* 17* 18* 20*  GLUCOSE 92   < > 81 95 129* 125* 154*  BUN 7   < > 5* '6 9 12 11  '$ CREATININE 0.77   < > 0.47 0.42* 0.38* 0.54 0.55  CALCIUM 8.6*   < > 7.6* 8.4* 9.2 10.3 9.3  MG 2.2  --   --  1.9 1.8 2.0 1.9  PHOS 3.3  --   --   --   --   --   --    < > = values in this interval not displayed.   GFR: Estimated Creatinine Clearance: 90.7 mL/min (by C-G formula based on SCr of 0.55 mg/dL). Liver Function Tests: Recent Labs  Lab 06/18/22 0432 06/19/22 0500 06/20/22 0500 06/21/22 0946 06/22/22 0229  AST 45* 39 '29 28 24  '$ ALT '20 19 22 22 19  '$ ALKPHOS 68 78 91 87 82  BILITOT 1.2 2.0* 1.7* 1.3* 1.3*  PROT 6.2* 6.8 7.7 7.9 7.6  ALBUMIN 3.3* 3.6 3.8 3.9 3.7   Recent Labs  Lab 06/17/22 0330  LIPASE 49   Recent Labs  Lab 06/17/22 0327 06/18/22 0431 06/22/22 0229  AMMONIA 78* 69* 71*   Coagulation Profile: Recent Labs  Lab 06/17/22 0330  INR 1.3*   Cardiac Enzymes: No results for input(s): "CKTOTAL", "CKMB", "CKMBINDEX", "TROPONINI" in the last 168 hours. BNP (last 3 results) No results for input(s): "PROBNP" in the last 8760 hours. HbA1C: No results for input(s): "HGBA1C" in the last 72 hours. CBG: No results for input(s): "GLUCAP" in the last 168 hours. Lipid Profile: No results for input(s): "CHOL", "HDL", "LDLCALC", "TRIG", "CHOLHDL", "LDLDIRECT" in the last 72 hours. Thyroid Function Tests: No results for input(s): "TSH", "T4TOTAL", "FREET4",  "T3FREE", "THYROIDAB" in the last 72 hours. Anemia Panel: No results for input(s): "VITAMINB12", "FOLATE", "FERRITIN", "TIBC", "IRON", "RETICCTPCT" in the last 72 hours. Sepsis Labs: Recent Labs  Lab 06/17/22 0526 06/17/22 1927 06/18/22 0828 06/22/22 0630  PROCALCITON  --   --  <0.10  --   LATICACIDVEN 1.8 2.8* 0.9 1.2    Recent Results (from the past 240 hour(s))  Blood Culture (routine x 2)     Status: None (Preliminary result)   Collection Time: 06/17/22  3:31 AM   Specimen: BLOOD  Result Value Ref Range Status  Specimen Description   Final    BLOOD BLOOD LEFT FOREARM Performed at [facility] [address], [place], [place] [address]    Special Requests   Final    BOTTLES DRAWN AEROBIC AND ANAEROBIC Blood Culture adequate volume Performed at [facility] [address], [place], [place] [address]    Culture   Final    NO GROWTH 4 DAYS Performed at [facility], [place] [address], [place], [place] [address]    Report Status PENDING  Incomplete  Blood Culture (routine x 2)     Status: None (Preliminary result)   Collection Time: 06/17/22  3:55 AM   Specimen: BLOOD  Result Value Ref Range Status   Specimen Description   Final    BLOOD BLOOD RIGHT FOREARM Performed at [facility] [address], [place], [place] [address]    Special Requests   Final    BOTTLES DRAWN AEROBIC AND ANAEROBIC Blood Culture results may not be optimal due to an excessive volume of blood received in culture bottles Performed at [place] [address] [place], [place] [address]    Culture   Final    NO GROWTH 4 DAYS Performed at [facility], [place] [address], [place], [place] [address]    Report Status PENDING  Incomplete  MRSA Next Gen by PCR, Nasal     Status: Abnormal   Collection Time: 06/17/22  8:53 AM   Specimen: Nasal Mucosa; Nasal Swab  Result Value Ref Range Status   MRSA by PCR Next  Gen DETECTED (A) NOT DETECTED Final    Comment: (NOTE) The GeneXpert MRSA Assay (FDA approved for NASAL specimens only), is one component of a comprehensive MRSA colonization surveillance program. It is not intended to diagnose MRSA infection nor to guide or monitor treatment for MRSA infections. Test performance is not FDA approved in patients less than 11 years old. Performed at [facility], [place] [address] [place], [place] [address]          Radiology Studies: No results found.      Scheduled Meds:  budesonide (PULMICORT) nebulizer solution  0.5 mg Nebulization BID   Chlorhexidine Gluconate Cloth  6 each Topical Daily   folic acid  1 mg Oral Daily   gabapentin  400 mg Oral TID   ipratropium-albuterol  3 mL Nebulization BID   lactulose  30 g Oral TID   methylPREDNISolone (SOLU-MEDROL) injection  40 mg Intravenous BID   midodrine  10 mg Oral TID WC   multivitamin with minerals  1 tablet Oral Daily   mupirocin ointment  1 Application Nasal BID   pantoprazole  40 mg Oral Daily   QUEtiapine  50 mg Oral QHS   thiamine  100 mg Oral Daily   Or   thiamine  100 mg Intravenous Daily   topiramate  100 mg Oral BID   Continuous Infusions:  sodium chloride Stopped (06/19/22 0713)   sodium chloride 250 mL (06/17/22 2132)   sodium chloride     albumin human       LOS: 5 days   Time spent= 35 mins    Matvey Llanas Arsenio Loader, MD [facility]  If 7PM-7AM, please contact night-coverage  06/22/2022, 7:28 AM

## 2022-06-22 NOTE — Progress Notes (Signed)
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 She was fluid resuscitated and started on broad spectrum ABX however there has been no clear source of sepsis with bland UA, CTA chest not suggestive of pneumonia, and abdominal ultrasound today without ascites. Her CBC has been stable here and BUN isn't rising quickly. She has been started on midodrine here and has had prior admissions complicated by hypotension thought due to low SVR state from cirrhosis where midodrine was considered at discharge. She has been started on course of steroids for aspiration pneumonitis 7/30- and in past has had AM cortisols suggestive of at least relative adrenal insufficiency. We are consulted today for hypotension.   Pertinent  Medical History  Alcoholic cirrhosis Obesity Depression TCP  Significant Hospital Events: Including procedures, antibiotic start and stop dates in addition to other pertinent events   06/17/22 admitted, started on zosyn for possible sepsis with unclear source 7/30 zosyn discontinued, steroids started for possible pneumonitis 7/31 midodrine started and titrated up  Interim History / Subjective:   Albumin 12.5 25% given this morning for hypotension  Objective   Blood pressure (!) 69/42, pulse (!) 57, temperature 97.9 F (36.6 C), temperature source Oral, resp. rate 17, height '5\' 4"'$  (1.626 m), weight 79.8 kg, SpO2 94 %.        Intake/Output Summary (Last 24 hours) at 06/22/2022 4098 Last data filed at 06/22/2022 1191 Gross per 24 hour  Intake 303.43 ml  Output --  Net 303.43 ml   Filed Weights   06/17/22 0857   Weight: 79.8 kg    Examination: General appearance: 46 y.o., female, NAD, drowsy but arousable Eyes: PERRL, tracking appropriately HENT: NCAT; MMM Lungs: CTAB, no crackles, no wheeze, with normal respiratory effort CV: bradycardic RR, no murmur  Abdomen: Soft, non-tender; non-distended, BS present  Extremities: No peripheral edema, warm, +clubbing Skin: Normal turgor and texture; no rash, +scattered telangiectasias Neuro: attends to conversation with encouragement, grossly nonfocal   Lactic normal  BUN/S Cr rising CBC stable   AM cortisol 0.8 yesterday despite dosing of solumedrol  BCx 06/17/22 NGTD  Procal 06/18/22 <10  TTE 06/19/22 normal, interpretation limited by poor parasternal window  Resolved Hospital Problem list    Assessment & Plan:   # Hypotension: Likely predominantly due to distributive state from her decompensated cirrhosis but her cortisol testing here and in past raises question of adrenal insufficiency. RAI is common in cirrhotics and while low CBG levels often affect total cortisol testing, resulting in overdiagnosis, CBG levels tend to be preserved in cirrhotics with albumin >2.5 as hers is. If still hypotensive despite these measures, occult PH not seen on poor quality surface TTE is alternative possibility.  - continue midodrine 10 TID - this has been considered at discharge at prior admissions where hypotension attributed to low SVR state from cirrhosis - we'll try to figure out a dose of hydrocortisone that's effective for her and add florinef if need be - order placed for 21-OH Ab - agree that there's no clear source of sepsis/infection, would hold ABX - monitor cbc at least daily to watch for bleeding  # Acute toxic, metabolic encephalopathy With PSE, sedating medications required  for management of her withdrawal/intoxication - CIWA-based ativan - lactulose   # Acute hypoxic respiratory failure Seems to have sleep disordered breathing and  atelectasis - PT/OT, mobilize, get in chair position at least during the day - frequent IS - sleep study as outpatient - wean O2 for FiO2 92% - if bp improves, trial of diuretic  # Cirrhosis decompensated by EV, PSE - lactulose as above - thiamine  # GAD, BiPD - seroquel per primary  # GERD - home ppi  # TCP - CTM  Best Practice (right click and "Reselect all SmartList Selections" daily)   Per primary  Critical care time: n/a

## 2022-06-22 NOTE — Progress Notes (Signed)
[facility] ADULT ICU REPLACEMENT PROTOCOL   The patient does apply for the [facility] Adult ICU Electrolyte Replacment Protocol based on the criteria listed below:   1.Exclusion criteria: TCTS patients, ECMO patients, and Dialysis patients 2. Is GFR >/= 30 ml/min? Yes.    Patient's GFR today is >60 3. Is SCr </= 2? Yes.   Patient's SCr is 0.55 mg/dL 4. Did SCr increase >/= 0.5 in 24 hours? No. 5.Pt's weight >40kg  Yes.   6. Abnormal electrolyte(s): K+ 3.5, mag 1.9  7. Electrolytes replaced per protocol 8.  Call MD STAT for K+ </= 2.5, Phos </= 1, or Mag </= 1 Physician:  n/a  Sara [name] 06/22/2022 4:48 AM

## 2022-06-22 NOTE — Progress Notes (Signed)
[facility] Progress Note Patient Name: Shuronda Santino DOB: [birth year] MRN: [id number]   Date of Service  06/22/2022  HPI/Events of Note  MAP < 60.  eICU Interventions  Lactic acid ordered. Albumin 5 % 250 ml iv bolus x 1.        Kerry Kass Laron Angelini 06/22/2022, 6:50 AM

## 2022-06-22 NOTE — Evaluation (Signed)
Occupational Therapy Evaluation Patient Details Name: Sara [name] MRN: [id number] DOB: Oct 18, 1976 Today's Date: 06/22/2022   History of Present Illness Sara [name] is a 46 y.o. female presenting to [facility] ED on 7/28 secondary to alcohol intoxication and related fall, became hypoxic put on oxygen and was also hypotensive. PMH: multiple admissions for alcohol detoxification, chronic cirrhosis secondary to alcohol abuse with 2-3 days at [facility], pancytopenia secondary to cirrhosis, depression anxiety, anemia, GERD, upper GI bleed.   Clinical Impression   Patient is a 46 year old female who was admitted for above. Patient was living at home independently with husband prior level. Patient was noted to have two LOB during transfer to commode and completing hygiene tasks with min A to maintain balance. Patient anticipated to progress to not need follow up OT in next level of care during acute stay. Patient would continue to benefit from skilled OT services at this time while admitted to address noted deficits in order to improve overall safety and independence in ADLs.       Recommendations for follow up therapy are one component of a multi-disciplinary discharge planning process, led by the attending physician.  Recommendations may be updated based on patient status, additional functional criteria and insurance authorization.   Follow Up Recommendations  No OT follow up    Assistance Recommended at Discharge Frequent or constant Supervision/Assistance  Patient can return home with the following A little help with bathing/dressing/bathroom;A little help with walking and/or transfers;Assistance with cooking/housework;Direct supervision/assist for financial management;Assist for transportation;Help with stairs or ramp for entrance;Direct supervision/assist for medications management    Functional Status Assessment  Patient has had a recent decline in their functional status and demonstrates the  ability to make significant improvements in function in a reasonable and predictable amount of time.  Equipment Recommendations  None recommended by OT    Recommendations for Other Services       Precautions / Restrictions Precautions Precautions: Fall Precaution Comments: Watch BP (hypotensive) and SpO2 (hypoxia) Restrictions Weight Bearing Restrictions: No      Mobility Bed Mobility Overal bed mobility: Modified Independent                  Transfers                          Balance Overall balance assessment: Mild deficits observed, not formally tested                                         ADL either performed or assessed with clinical judgement   ADL Overall ADL's : Needs assistance/impaired Eating/Feeding: Set up;Sitting   Grooming: Set up;Sitting Grooming Details (indicate cue type and reason): patient reported she already completed ADLs this AM Upper Body Bathing: Set up;Sitting   Lower Body Bathing: Minimal assistance;Sit to/from stand;Sitting/lateral leans Lower Body Bathing Details (indicate cue type and reason): very unsteady with standing at [facility] Upper Body Dressing : Set up;Sitting   Lower Body Dressing: Sit to/from stand;Sitting/lateral leans;Minimal assistance Lower Body Dressing Details (indicate cue type and reason): donned/doffed socks with min guard sitting EOB. unsteady when standing. Toilet Transfer: Minimal assistance;Rolling walker (2 wheels);BSC/3in1 Toilet Transfer Details (indicate cue type and reason): with increased time patient reported no dizziness with movement. Toileting- Clothing Manipulation and Hygiene: Sit to/from stand;Minimal assistance  Vision Patient Visual Report: No change from baseline       Perception     Praxis      Pertinent Vitals/Pain Pain Assessment Pain Assessment: No/denies pain     Hand Dominance     Extremity/Trunk Assessment Upper Extremity  Assessment Upper Extremity Assessment: Overall WFL for tasks assessed   Lower Extremity Assessment Lower Extremity Assessment: Defer to PT evaluation   Cervical / Trunk Assessment Cervical / Trunk Assessment: Normal   Communication Communication Communication: No difficulties   Cognition Arousal/Alertness: Awake/alert Behavior During Therapy: WFL for tasks assessed/performed Overall Cognitive Status: Within Functional Limits for tasks assessed                                 General Comments: reported it was sunday. had a diffiult time following  cues to squeeze fingers     General Comments       Exercises     Shoulder Instructions      Home Living Family/patient expects to be discharged to:: Private residence Living Arrangements: Spouse/significant other Available Help at Discharge: Family;Available 24 hours/day Type of Home: House Home Access: Stairs to enter [employer] of Steps: several Entrance Stairs-Rails: Left Home Layout: Two level Alternate Level Stairs-Number of Steps: flight Alternate Level Stairs-Rails: Left Bathroom Shower/Tub: Occupational psychologist: Standard     Home Equipment: Conservation officer, nature (2 wheels);Crutches          Prior Functioning/Environment Prior Level of Function : Independent/Modified Independent;History of Falls (last six months)             Mobility Comments: ind ADLs Comments: ind        OT Problem List: Decreased strength;Decreased activity tolerance;Impaired balance (sitting and/or standing);Decreased safety awareness;Cardiopulmonary status limiting activity;Decreased knowledge of precautions;Decreased knowledge of use of DME or AE      OT Treatment/Interventions: Self-care/ADL training;Therapeutic exercise;Neuromuscular education;Energy conservation;DME and/or AE instruction;Therapeutic activities;Balance training;Patient/family education    OT Goals(Current goals can be found in  the care plan section) Acute Rehab OT Goals Patient Stated Goal: to go home OT Goal Formulation: With patient Time For Goal Achievement: 07/06/22 Potential to Achieve Goals: Fair  OT Frequency: Min 2X/week    Co-evaluation              AM-PAC OT "6 Clicks" Daily Activity     Outcome Measure Help from another person eating meals?: None Help from another person taking care of personal grooming?: None Help from another person toileting, which includes using toliet, bedpan, or urinal?: A Little Help from another person bathing (including washing, rinsing, drying)?: A Little Help from another person to put on and taking off regular upper body clothing?: A Little Help from another person to put on and taking off regular lower body clothing?: A Little 6 Click Score: 20   End of Session Equipment Utilized During Treatment: Gait belt;Rolling walker (2 wheels) Nurse Communication: Mobility status  Activity Tolerance: Patient tolerated treatment well Patient left: in bed;with call bell/phone within reach;with bed alarm set  OT Visit Diagnosis: Unsteadiness on feet (R26.81);Other abnormalities of gait and mobility (R26.89);Muscle weakness (generalized) (M62.81)                Time: 0962-8366 OT Time Calculation (min): 12 min Charges:  OT General Charges $OT Visit: 1 Visit OT Evaluation $OT Eval Low Complexity: 1 Low  Jackelyn Poling OTR/L, MS Acute Rehabilitation Department Office# [phone] Pager# [phone]  Feliz Beam Leesa Leifheit 06/22/2022, 12:07 PM

## 2022-06-22 NOTE — Progress Notes (Signed)
Elink provider notified for decrease in BP. Last check 66/36 (45). Lactic ordered, bolus requested for continued low pressures. Awaiting order. Will continue to assess.

## 2022-06-22 NOTE — Evaluation (Signed)
Physical Therapy Evaluation Patient Details Name: Sara [name] MRN: [id number] DOB: June 19, 1976 Today's Date: 06/22/2022  History of Present Illness  Sara [name] is a 46 y.o. female presenting to [facility] ED on 7/28 secondary to alcohol intoxication and related fall, became hypoxic put on oxygen and was also hypotensive. PMH: multiple admissions for alcohol detoxification, chronic cirrhosis secondary to alcohol abuse with 2-3 days at [facility], pancytopenia secondary to cirrhosis, depression anxiety, anemia, GERD, upper GI bleed.   Clinical Impression  Pt presents with the problems listed above and functional impairments below. Pt on 10LO2 via HFNC, SpO2 monitored and ranged 90-97%; HR remained in the 70s, BP in sitting prior to activity 100/66. Pt demonstrated modified independence for bed mobility, min guard for transfers, and min guard for ambulation in hallway 140f with +2 for recliner follow for safety. Trialed use of RW during ambulation but due to unfamiliarity discontinued after initial 755f remainder pt ambulated without AD, single lateral LOB. BP after initial 7540furing standing rest break 105/66, pt reporting absence of any hypotensive symptoms. No current equipment recommendations, will update pending pt progression. At this time, no PT follow-up post-discharge. We will continue to follow acutely.     Recommendations for follow up therapy are one component of a multi-disciplinary discharge planning process, led by the attending physician.  Recommendations may be updated based on patient status, additional functional criteria and insurance authorization.  Follow Up Recommendations No PT follow up      Assistance Recommended at Discharge PRN  Patient can return home with the following  A little help with walking and/or transfers;Assist for transportation;Help with stairs or ramp for entrance    Equipment Recommendations None recommended by PT (TBD, pending progression)   Recommendations for Other Services       Functional Status Assessment Patient has had a recent decline in their functional status and/or demonstrates limited ability to make significant improvements in function in a reasonable and predictable amount of time     Precautions / Restrictions Precautions Precautions: Fall Precaution Comments: Watch BP (hypotensive) and SpO2 (hypoxia) Restrictions Weight Bearing Restrictions: No      Mobility  Bed Mobility Overal bed mobility: Modified Independent             General bed mobility comments: increased time. BP in sitting 100/60    Transfers Overall transfer level: Needs assistance Equipment used: Rolling walker (2 wheels) Transfers: Sit to/from Stand Sit to Stand: Min guard           General transfer comment: For safety only, no physical assist required or overt LOB.    Ambulation/Gait Ambulation/Gait assistance: Min guard, +2 safety/equipment Gait Distance (Feet): 175 Feet Assistive device: Rolling walker (2 wheels), None Gait Pattern/deviations: Step-through pattern, Narrow base of support, Drifts right/left Gait velocity: decreased     General Gait Details: Pt ambulated 175f35fth min guard +2 for recliner follow lines management, no physical assist required, single lateral LOB noted. Trialed use of RW for initial 75ft52f pt found it to be cumbersome and a distraction, remaining 100ft 13fout AD during which time pt had the aforementioned LOB. Pt on 10LO2 via HFNC, SpO2 ranged 90-97%, HR in the 70s. BP during standing rest break after 75ft 153f0  Stairs            Wheelchair Mobility    Modified Rankin (Stroke Patients Only)       Balance  Pertinent Vitals/Pain Pain Assessment Pain Assessment: No/denies pain    Home Living Family/patient expects to be discharged to:: Private residence Living Arrangements: Spouse/significant  other Available Help at Discharge: Family;Available 24 hours/day Type of Home: House Home Access: Stairs to enter Entrance Stairs-Rails: Left Entrance Stairs-Number of Steps: several Alternate Level Stairs-Number of Steps: flight Home Layout: Two level Home Equipment: Conservation officer, nature (2 wheels);Crutches      Prior Function Prior Level of Function : Independent/Modified Independent;History of Falls (last six months)             Mobility Comments: ind ADLs Comments: ind     Hand Dominance        Extremity/Trunk Assessment   Upper Extremity Assessment Upper Extremity Assessment: Overall WFL for tasks assessed    Lower Extremity Assessment Lower Extremity Assessment: Overall WFL for tasks assessed    Cervical / Trunk Assessment Cervical / Trunk Assessment: Normal  Communication   Communication: No difficulties  Cognition Arousal/Alertness: Awake/alert Behavior During Therapy: WFL for tasks assessed/performed Overall Cognitive Status: Within Functional Limits for tasks assessed                                 General Comments: Pt AOx4, able to name month and year but not day of the week without cuing        General Comments      Exercises     Assessment/Plan    PT Assessment Patient needs continued PT services  PT Problem List Decreased strength;Decreased range of motion;Decreased activity tolerance;Decreased balance;Decreased mobility;Decreased coordination       PT Treatment Interventions DME instruction;Gait training;Stair training;Functional mobility training;Therapeutic activities;Therapeutic exercise;Balance training;Neuromuscular re-education;Patient/family education    PT Goals (Current goals can be found in the Care Plan section)  Acute Rehab PT Goals Patient Stated Goal: To improve balance PT Goal Formulation: With patient Time For Goal Achievement: 07/06/22 Potential to Achieve Goals: Good    Frequency Min 3X/week      Co-evaluation               AM-PAC PT "6 Clicks" Mobility  Outcome Measure Help needed turning from your back to your side while in a flat bed without using bedrails?: None Help needed moving from lying on your back to sitting on the side of a flat bed without using bedrails?: None Help needed moving to and from a bed to a chair (including a wheelchair)?: A Little Help needed standing up from a chair using your arms (e.g., wheelchair or bedside chair)?: A Little Help needed to walk in hospital room?: A Little Help needed climbing 3-5 steps with a railing? : A Little 6 Click Score: 20    End of Session Equipment Utilized During Treatment: Gait belt;Oxygen (10L via HFNC, SpO2 90-97%) Activity Tolerance: Patient tolerated treatment well;No increased pain Patient left: in chair;with call bell/phone within reach;with chair alarm set Nurse Communication: Mobility status PT Visit Diagnosis: Other abnormalities of gait and mobility (R26.89);History of falling (Z91.81);Unsteadiness on feet (R26.81)    Time: 6387-5643 PT Time Calculation (min) (ACUTE ONLY): 23 min   Charges:   PT Evaluation $PT Eval Low Complexity: 1 Low         Coolidge Breeze, PT, DPT [facility] Rehabilitation Department Office: [phone] Pager: [phone]  Coolidge Breeze 06/22/2022, 11:07 AM

## 2022-06-23 ENCOUNTER — Inpatient Hospital Stay (HOSPITAL_COMMUNITY): Payer: Self-pay

## 2022-06-23 DIAGNOSIS — R0602 Shortness of breath: Secondary | ICD-10-CM

## 2022-06-23 LAB — CBC
HCT: 38.9 % (ref 36.0–46.0)
Hemoglobin: 12.7 g/dL (ref 12.0–15.0)
MCH: 29 pg (ref 26.0–34.0)
MCHC: 32.6 g/dL (ref 30.0–36.0)
MCV: 88.8 fL (ref 80.0–100.0)
Platelets: 95 10*3/uL — ABNORMAL LOW (ref 150–400)
RBC: 4.38 MIL/uL (ref 3.87–5.11)
RDW: 16.6 % — ABNORMAL HIGH (ref 11.5–15.5)
WBC: 5.6 10*3/uL (ref 4.0–10.5)
nRBC: 0 % (ref 0.0–0.2)

## 2022-06-23 LAB — COMPREHENSIVE METABOLIC PANEL
ALT: 22 U/L (ref 0–44)
AST: 27 U/L (ref 15–41)
Albumin: 3.7 g/dL (ref 3.5–5.0)
Alkaline Phosphatase: 73 U/L (ref 38–126)
Anion gap: 7 (ref 5–15)
BUN: 9 mg/dL (ref 6–20)
CO2: 20 mmol/L — ABNORMAL LOW (ref 22–32)
Calcium: 9.3 mg/dL (ref 8.9–10.3)
Chloride: 113 mmol/L — ABNORMAL HIGH (ref 98–111)
Creatinine, Ser: 0.38 mg/dL — ABNORMAL LOW (ref 0.44–1.00)
GFR, Estimated: >60 mL/min (ref >60)
Glucose, Bld: 133 mg/dL — ABNORMAL HIGH (ref 70–99)
Potassium: 3.2 mmol/L — ABNORMAL LOW (ref 3.5–5.1)
Sodium: 140 mmol/L (ref 135–145)
Total Bilirubin: 1.1 mg/dL (ref 0.3–1.2)
Total Protein: 7.2 g/dL (ref 6.5–8.1)

## 2022-06-23 LAB — AMMONIA: Ammonia: 72 umol/L — ABNORMAL HIGH (ref 9–35)

## 2022-06-23 LAB — MAGNESIUM: Magnesium: 2 mg/dL (ref 1.7–2.4)

## 2022-06-23 LAB — CALCIUM, IONIZED: Calcium, Ionized, Serum: 5.4 mg/dL (ref 4.5–5.6)

## 2022-06-23 MED ORDER — POTASSIUM CHLORIDE 20 MEQ PO PACK
40.0000 meq | PACK | ORAL | Status: AC
  Administered 2022-06-23 (×2): 40 meq via ORAL
  Filled 2022-06-23 (×2): qty 2

## 2022-06-23 MED ORDER — FLUDROCORTISONE ACETATE 0.1 MG PO TABS
0.1000 mg | ORAL_TABLET | Freq: Every day | ORAL | Status: DC
  Administered 2022-06-23 – 2022-06-25 (×3): 0.1 mg via ORAL
  Filled 2022-06-23 (×3): qty 1

## 2022-06-23 NOTE — Progress Notes (Signed)
  Echocardiogram 2D Echocardiogram has been performed.  Sara [name] 06/23/2022, 11:53 AM

## 2022-06-23 NOTE — Progress Notes (Signed)
PROGRESS NOTE    Sara [name]  EXB:[id number] DOB: 19-Aug-1976 DOA: 06/17/2022 PCP: Pcp, No   Brief Narrative:  46 year old with history of alcohol abuse, cirrhosis, multiple admissions for alcohol detox, upper GI bleed, anemia, anxiety, depression, PTSD, pancytopenia, GERD comes to the ED by [facility] due to alcohol intoxication.  She drinks about 10 beers and about 1-2 bottles of wine daily.  Initially noted to be hypotensive and hypoxic requiring IV fluids.  UA showed mild urinary tract infection, ammonia level 78, alcohol level 289, potassium 3.2.  CTA chest was negative.  Echocardiogram overall is unremarkable, getting midodrine to help with blood pressures.  Pulmonary team consulted for their input as well.   Assessment & Plan:  Principal Problem:   Acute respiratory failure with hypoxia (HCC) Active Problems:   Generalized anxiety disorder   GERD (gastroesophageal reflux disease)   Alcohol abuse with intoxication (HCC)   Thrombocytopenia (HCC)   Hypotension   Hypokalemia   Bipolar 1 disorder (HCC)   Aspiration pneumonia (HCC)   Acute respiratory failure (HCC)   Acute respiratory failure with hypoxia ([place]) secondary to Aspiration pneumonitis (HCC) Hypovolemic shock with some concerns of adrenal insuff.  -Initially patient had hypovolemic shock. Scheduled and as needed bronchodilators.  I-S/flutter valve.   Echocardiogram unremarkable.  Continue midodrine 10 mg 3 times daily, pressure still remains soft.  Pulmonary team following.  This morning on 5-7 L nasal cannula. Concerns of adrenal insufficiency, on hydrocortisone 50 mg BID.  We will add Florinef Daily.  Will request Cardiology to weight in for possible RHC.  Echo with bubble study?     Alcohol abuse with intoxication ([place]) Completed Librium taper.  Supportive care.  Hypokalemia/hypomagnesemia - Aggressive repletion  Lactic acidosis -Resolved with fluids  Alcoholic liver cirrhosis Hepatic encephalopathy -  Elevated ammonia level.  Lactulose 3 times daily.  Encouraged her to take this      Generalized anxiety disorder   Bipolar 1 disorder (HCC) Seroquel '50mg'$  qhs ('100mg'$  at home)     GERD (gastroesophageal reflux disease) History of GI bleed Daily PPI     Thrombocytopenia (HCC) Pancytopenia Likely from underlying cirrhosis.  No evidence of bleeding.  Continue to monitor       DVT prophylaxis: SCDs Start: 06/17/22 0809 Code Status: Full Family Communication:    Status is: Inpatient Remains inpatient appropriate because: Continues to have soft BP and requiring O2.   Subjective: When I walked in the room patient was on 7 L nasal cannula saturating 100%.  She denied any complaints.  I weaned her down to 4 L nasal cannula and she was still saturating almost 100% and later on room air remained above 95%. She denied any shortness of breath, orthopnea, chest pain.  Blood pressure still remained soft overnight.  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  And nonfocal Psychiatric: Normal judgment and insight. Alert and oriented x 3. Normal mood. Objective: Vitals:   06/23/22 0300 06/23/22 0400 06/23/22 0500 06/23/22 0600  BP:  110/62 (!) 88/58 (!) 89/49  Pulse:  (!) 47 (!) 49 (!) 48  Resp:  20 (!) 21 (!) 21  Temp: 98.2 F (36.8 C)     TempSrc: Oral     SpO2:  (!) 87% 92% 91%  Weight:      Height:  Intake/Output Summary (Last 24 hours) at 06/23/2022 0725 Last data filed at 06/22/2022 1400 Gross per 24 hour  Intake 480 ml  Output 600 ml  Net -120 ml   Filed Weights   06/17/22 0857  Weight: 79.8 kg     Data Reviewed:   CBC: Recent Labs  Lab 06/17/22 0330 06/17/22 0401 06/19/22 0500 06/20/22 0500 06/21/22 0946  06/22/22 0229 06/23/22 0202  WBC 4.6   < > 2.7* 3.9* 4.7 4.8 5.6  NEUTROABS 2.0  --   --   --   --   --   --   HGB 12.5   < > 11.2* 12.4 13.4 13.0 12.7  HCT 37.8   < > 34.8* 38.1 41.9 39.5 38.9  MCV 88.5   < > 89.7 89.0 90.3 89.2 88.8  PLT 90*   < > 52* 75* 75* 95* 95*   < > = values in this interval not displayed.   Basic Metabolic Panel: Recent Labs  Lab 06/17/22 0330 06/17/22 0401 06/19/22 0500 06/20/22 0500 06/21/22 0946 06/22/22 0229 06/23/22 0202  NA 143   < > 137 135 139 137 140  K 3.2*   < > 3.3* 3.8 3.6 3.5 3.2*  CL 117*   < > 110 110 112* 110 113*  CO2 17*   < > 20* 17* 18* 20* 20*  GLUCOSE 92   < > 95 129* 125* 154* 133*  BUN 7   < > '6 9 12 11 9  '$ CREATININE 0.77   < > 0.42* 0.38* 0.54 0.55 0.38*  CALCIUM 8.6*   < > 8.4* 9.2 10.3 9.3 9.3  MG 2.2  --  1.9 1.8 2.0 1.9 2.0  PHOS 3.3  --   --   --   --   --   --    < > = values in this interval not displayed.   GFR: Estimated Creatinine Clearance: 90.7 mL/min (A) (by C-G formula based on SCr of 0.38 mg/dL (L)). Liver Function Tests: Recent Labs  Lab 06/19/22 0500 06/20/22 0500 06/21/22 0946 06/22/22 0229 06/23/22 0202  AST 39 '29 28 24 27  '$ ALT '19 22 22 19 22  '$ ALKPHOS 78 91 87 82 73  BILITOT 2.0* 1.7* 1.3* 1.3* 1.1  PROT 6.8 7.7 7.9 7.6 7.2  ALBUMIN 3.6 3.8 3.9 3.7 3.7   Recent Labs  Lab 06/17/22 0330  LIPASE 49   Recent Labs  Lab 06/17/22 0327 06/18/22 0431 06/22/22 0229 06/23/22 0202  AMMONIA 78* 69* 71* 72*   Coagulation Profile: Recent Labs  Lab 06/17/22 0330  INR 1.3*   Cardiac Enzymes: No results for input(s): "CKTOTAL", "CKMB", "CKMBINDEX", "TROPONINI" in the last 168 hours. BNP (last 3 results) No results for input(s): "PROBNP" in the last 8760 hours. HbA1C: No results for input(s): "HGBA1C" in the last 72 hours. CBG: No results for input(s): "GLUCAP" in the last 168 hours. Lipid Profile: No results for input(s): "CHOL", "HDL", "LDLCALC", "TRIG", "CHOLHDL", "LDLDIRECT" in the  last 72 hours. Thyroid Function Tests: No results for input(s): "TSH", "T4TOTAL", "FREET4", "T3FREE", "THYROIDAB" in the last 72 hours. Anemia Panel: No results for input(s): "VITAMINB12", "FOLATE", "FERRITIN", "TIBC", "IRON", "RETICCTPCT" in the last 72 hours. Sepsis Labs: Recent Labs  Lab 06/17/22 0526 06/17/22 1927 06/18/22 0828 06/22/22 0630  PROCALCITON  --   --  <0.10  --   LATICACIDVEN 1.8 2.8* 0.9 1.2    Recent Results (from the past 240 hour(s))  Blood Culture (routine x 2)  Status: None   Collection Time: 06/17/22  3:31 AM   Specimen: BLOOD  Result Value Ref Range Status   Specimen Description   Final    BLOOD BLOOD LEFT FOREARM Performed at [facility] [address], [place], [place] [address]    Special Requests   Final    BOTTLES DRAWN AEROBIC AND ANAEROBIC Blood Culture adequate volume Performed at [facility] [address], [place], [place] [address]    Culture   Final    NO GROWTH 5 DAYS Performed at [facility], [place] [address], [place], [place] [address]    Report Status 06/22/2022 FINAL  Final  Blood Culture (routine x 2)     Status: None   Collection Time: 06/17/22  3:55 AM   Specimen: BLOOD  Result Value Ref Range Status   Specimen Description   Final    BLOOD BLOOD RIGHT FOREARM Performed at [facility] [address], [place], [place] [address]    Special Requests   Final    BOTTLES DRAWN AEROBIC AND ANAEROBIC Blood Culture results may not be optimal due to an excessive volume of blood received in culture bottles Performed at [facility] [address], [place], [place] [address]    Culture   Final    NO GROWTH 5 DAYS Performed at [facility], [place] [address] [place], [place] [address]    Report Status 06/22/2022 FINAL  Final  MRSA Next Gen by PCR, Nasal     Status: Abnormal   Collection Time: 06/17/22  8:53 AM   Specimen: Nasal Mucosa;  Nasal Swab  Result Value Ref Range Status   MRSA by PCR Next Gen DETECTED (A) NOT DETECTED Final    Comment: (NOTE) The GeneXpert MRSA Assay (FDA approved for NASAL specimens only), is one component of a comprehensive MRSA colonization surveillance program. It is not intended to diagnose MRSA infection nor to guide or monitor treatment for MRSA infections. Test performance is not FDA approved in patients less than 53 years old. Performed at [facility], [place] [address] [place], [place] [address]          Radiology Studies: No results found.      Scheduled Meds:  budesonide (PULMICORT) nebulizer solution  0.5 mg Nebulization BID   Chlorhexidine Gluconate Cloth  6 each Topical Daily   folic acid  1 mg Oral Daily   gabapentin  400 mg Oral TID   hydrocortisone  50 mg Oral BID   ipratropium-albuterol  3 mL Nebulization BID   lactulose  30 g Oral TID   midodrine  10 mg Oral TID WC   multivitamin with minerals  1 tablet Oral Daily   mupirocin ointment  1 Application Nasal BID   pantoprazole  40 mg Oral Daily   potassium chloride  40 mEq Oral Q4H   QUEtiapine  50 mg Oral QHS   thiamine  100 mg Oral Daily   Or   thiamine  100 mg Intravenous Daily   topiramate  100 mg Oral BID   Continuous Infusions:  sodium chloride Stopped (06/19/22 0713)   sodium chloride 250 mL (06/17/22 2132)   sodium chloride       LOS: 6 days   Time spent= 35 mins    Srihitha Tagliaferri Arsenio Loader, MD [facility]  If 7PM-7AM, please contact night-coverage  06/23/2022, 7:25 AM

## 2022-06-23 NOTE — Progress Notes (Signed)
PCCM Brief Progress Note  # Hypotension Multifactorial with liver disease/portal HTN, possible adrenal insufficiency - 21-OH Ab ordered  - would continue titration of hydrocortisone, add florinef as needed and refer to endocrine at discharge  # Acute hypoxic respiratory failure Atelectasis, undiagnosed OSA likely contributors.  - PT/mobilize aggressively - frequent IS - limited echo ordered to assess for intrapulmonary shunt in setting of her cirrhosis, tortuous appearing PA on CT - walking oximetry prior to discharge - wean O2 for saturation >92% - sleep study as an outpatient  Will sign off but glad to be reinvolved as condition changes  [place]

## 2022-06-24 LAB — CBC
HCT: 40.5 % (ref 36.0–46.0)
Hemoglobin: 13.1 g/dL (ref 12.0–15.0)
MCH: 29.2 pg (ref 26.0–34.0)
MCHC: 32.3 g/dL (ref 30.0–36.0)
MCV: 90.4 fL (ref 80.0–100.0)
Platelets: 92 10*3/uL — ABNORMAL LOW (ref 150–400)
RBC: 4.48 MIL/uL (ref 3.87–5.11)
RDW: 16.9 % — ABNORMAL HIGH (ref 11.5–15.5)
WBC: 5 10*3/uL (ref 4.0–10.5)
nRBC: 0 % (ref 0.0–0.2)

## 2022-06-24 LAB — BASIC METABOLIC PANEL
Anion gap: 10 (ref 5–15)
BUN: 10 mg/dL (ref 6–20)
CO2: 21 mmol/L — ABNORMAL LOW (ref 22–32)
Calcium: 8.9 mg/dL (ref 8.9–10.3)
Chloride: 109 mmol/L (ref 98–111)
Creatinine, Ser: 0.4 mg/dL — ABNORMAL LOW (ref 0.44–1.00)
GFR, Estimated: >60 mL/min (ref >60)
Glucose, Bld: 142 mg/dL — ABNORMAL HIGH (ref 70–99)
Potassium: 3.2 mmol/L — ABNORMAL LOW (ref 3.5–5.1)
Sodium: 140 mmol/L (ref 135–145)

## 2022-06-24 LAB — MAGNESIUM: Magnesium: 1.9 mg/dL (ref 1.7–2.4)

## 2022-06-24 MED ORDER — POTASSIUM CHLORIDE 20 MEQ PO PACK
40.0000 meq | PACK | ORAL | Status: AC
  Administered 2022-06-24 (×2): 40 meq via ORAL
  Filled 2022-06-24 (×2): qty 2

## 2022-06-24 NOTE — Progress Notes (Signed)
PROGRESS NOTE    Sara [name]  ZOX:[id number] DOB: 1976/09/15 DOA: 06/17/2022 PCP: Pcp, No   Brief Narrative:  46 year old with history of alcohol abuse, cirrhosis, multiple admissions for alcohol detox, upper GI bleed, anemia, anxiety, depression, PTSD, pancytopenia, GERD comes to the ED by [facility] due to alcohol intoxication.  She drinks about 10 beers and about 1-2 bottles of wine daily.  Initially noted to be hypotensive and hypoxic requiring IV fluids.  UA showed mild urinary tract infection, ammonia level 78, alcohol level 289, potassium 3.2.  CTA chest was negative.  Echocardiogram overall is unremarkable, getting midodrine to help with blood pressures.  Pulmonary team consulted for their input as well.  Limited echocardiogram with bubble study showed positive for ASD shunt, case discussed with cardiology but did not recommend any further work-up at this time.   Assessment & Plan:  Principal Problem:   Acute respiratory failure with hypoxia (HCC) Active Problems:   Generalized anxiety disorder   GERD (gastroesophageal reflux disease)   Alcohol abuse with intoxication (HCC)   Thrombocytopenia (HCC)   Hypotension   Hypokalemia   Bipolar 1 disorder (HCC)   Aspiration pneumonia (HCC)   Acute respiratory failure (HCC)   Acute respiratory failure with hypoxia ([place]) secondary to Aspiration pneumonitis (HCC) Hypovolemic shock with some concerns of adrenal insuff.  -Initially patient had hypovolemic shock. Scheduled and as needed bronchodilators.  I-S/flutter valve.   Echocardiogram unremarkable.  Continue midodrine 10 mg 3 times daily, pressure still remains soft.  Pulmonary team following.  This morning on 5-7 L nasal cannula. Concerns of adrenal insufficiency, on hydrocortisone 50 mg BID and daily Florinef No right heart cath indicated per cardiology  ASD, positive shunt - Limited echo bubble study done which showed positive ASD.  Case discussed with Dr. Harl Bowie from cardiology,  no further work-up indicated at this time.     Alcohol abuse with intoxication ([place]) Completed Librium taper.  Supportive care.  Hypokalemia/hypomagnesemia - Aggressive repletion  Lactic acidosis -Resolved with fluids  Alcoholic liver cirrhosis Hepatic encephalopathy - Elevated ammonia level.  Lactulose 3 times daily.  Encouraged her to take this      Generalized anxiety disorder   Bipolar 1 disorder (HCC) Seroquel '50mg'$  qhs ('100mg'$  at home)     GERD (gastroesophageal reflux disease) History of GI bleed Daily PPI     Thrombocytopenia (HCC) Pancytopenia Likely from underlying cirrhosis.  No evidence of bleeding.  Continue to monitor       DVT prophylaxis: SCDs Start: 06/17/22 0809 Code Status: Full Family Communication: None  Status is: Inpatient Remains inpatient appropriate because: Blood pressure slowly improving, transferred to [facility].  If remains stable, will plan on discharging her home.  Subjective: Seen and examined at bedside, no new complaints this morning.  Blood pressure appears to be slightly better.  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  And nonfocal Psychiatric: Normal judgment and insight. Alert and oriented x 3. Normal mood. Objective: Vitals:   06/24/22 0819 06/24/22 0823 06/24/22 0833 06/24/22 0837  BP:  109/67    Pulse:  (!) 48 63 67  Resp:  '15 17 15  '$ Temp:  (!) 97.5 F (36.4 C)    TempSrc:  Axillary    SpO2: 95% 98% 93% 92%  Weight:      Height:  Intake/Output Summary (Last 24 hours) at 06/24/2022 1211 Last data filed at 06/24/2022 0800 Gross per 24 hour  Intake 480 ml  Output --  Net 480 ml   Filed Weights   06/17/22 0857  Weight: 79.8 kg     Data Reviewed:    CBC: Recent Labs  Lab 06/20/22 0500 06/21/22 0946 06/22/22 0229 06/23/22 0202 06/24/22 0213  WBC 3.9* 4.7 4.8 5.6 5.0  HGB 12.4 13.4 13.0 12.7 13.1  HCT 38.1 41.9 39.5 38.9 40.5  MCV 89.0 90.3 89.2 88.8 90.4  PLT 75* 75* 95* 95* 92*   Basic Metabolic Panel: Recent Labs  Lab 06/20/22 0500 06/21/22 0946 06/22/22 0229 06/23/22 0202 06/24/22 0213  NA 135 139 137 140 140  K 3.8 3.6 3.5 3.2* 3.2*  CL 110 112* 110 113* 109  CO2 17* 18* 20* 20* 21*  GLUCOSE 129* 125* 154* 133* 142*  BUN '9 12 11 9 10  '$ CREATININE 0.38* 0.54 0.55 0.38* 0.40*  CALCIUM 9.2 10.3 9.3 9.3 8.9  MG 1.8 2.0 1.9 2.0 1.9   GFR: Estimated Creatinine Clearance: 90.7 mL/min (A) (by C-G formula based on SCr of 0.4 mg/dL (L)). Liver Function Tests: Recent Labs  Lab 06/19/22 0500 06/20/22 0500 06/21/22 0946 06/22/22 0229 06/23/22 0202  AST 39 '29 28 24 27  '$ ALT '19 22 22 19 22  '$ ALKPHOS 78 91 87 82 73  BILITOT 2.0* 1.7* 1.3* 1.3* 1.1  PROT 6.8 7.7 7.9 7.6 7.2  ALBUMIN 3.6 3.8 3.9 3.7 3.7   No results for input(s): "LIPASE", "AMYLASE" in the last 168 hours.  Recent Labs  Lab 06/18/22 0431 06/22/22 0229 06/23/22 0202  AMMONIA 69* 71* 72*   Coagulation Profile: No results for input(s): "INR", "PROTIME" in the last 168 hours.  Cardiac Enzymes: No results for input(s): "CKTOTAL", "CKMB", "CKMBINDEX", "TROPONINI" in the last 168 hours. BNP (last 3 results) No results for input(s): "PROBNP" in the last 8760 hours. HbA1C: No results for input(s): "HGBA1C" in the last 72 hours. CBG: No results for input(s): "GLUCAP" in the last 168 hours. Lipid Profile: No results for input(s): "CHOL", "HDL", "LDLCALC", "TRIG", "CHOLHDL", "LDLDIRECT" in the last 72 hours. Thyroid Function Tests: No results for input(s): "TSH", "T4TOTAL", "FREET4", "T3FREE", "THYROIDAB" in the last 72 hours. Anemia Panel: No results for input(s): "VITAMINB12", "FOLATE", "FERRITIN", "TIBC", "IRON", "RETICCTPCT" in the last 72  hours. Sepsis Labs: Recent Labs  Lab 06/17/22 1927 06/18/22 0828 06/22/22 0630  PROCALCITON  --  <0.10  --   LATICACIDVEN 2.8* 0.9 1.2    Recent Results (from the past 240 hour(s))  Blood Culture (routine x 2)     Status: None   Collection Time: 06/17/22  3:31 AM   Specimen: BLOOD  Result Value Ref Range Status   Specimen Description   Final    BLOOD BLOOD LEFT FOREARM Performed at [facility] [address], [place], [place] [address]    Special Requests   Final    BOTTLES DRAWN AEROBIC AND ANAEROBIC Blood Culture adequate volume Performed at [facility] [address], [place], [place] [address]    Culture   Final    NO GROWTH 5 DAYS Performed at [facility], [place] [address] [place], [place] [address]    Report Status 06/22/2022 FINAL  Final  Blood Culture (routine x 2)     Status: None   Collection Time: 06/17/22  3:55 AM   Specimen: BLOOD  Result Value Ref Range Status  Specimen Description   Final    BLOOD BLOOD RIGHT FOREARM Performed at [facility] [address], [place], [place] [address]    Special Requests   Final    BOTTLES DRAWN AEROBIC AND ANAEROBIC Blood Culture results may not be optimal due to an excessive volume of blood received in culture bottles Performed at [facility] [address], [place], [place] [address]    Culture   Final    NO GROWTH 5 DAYS Performed at [facility], [place] [address] [place], [place] [address]    Report Status 06/22/2022 FINAL  Final  MRSA Next Gen by PCR, Nasal     Status: Abnormal   Collection Time: 06/17/22  8:53 AM   Specimen: Nasal Mucosa; Nasal Swab  Result Value Ref Range Status   MRSA by PCR Next Gen DETECTED (A) NOT DETECTED Final    Comment: (NOTE) The GeneXpert MRSA Assay (FDA approved for NASAL specimens only), is one component of a comprehensive MRSA colonization surveillance program. It is not intended to  diagnose MRSA infection nor to guide or monitor treatment for MRSA infections. Test performance is not FDA approved in patients less than 77 years old. Performed at [facility], [place] [address] [place],  [address]          Radiology Studies: ECHOCARDIOGRAM LIMITED BUBBLE STUDY  Result Date: 06/23/2022    ECHOCARDIOGRAM LIMITED REPORT   Patient Name:   SHAUNTIA LEVENGOOD Date of Exam: 06/23/2022 Medical Rec #:  [id number]              Height:       64.0 in Accession #:    [id number]             Weight:       175.9 lb Date of Birth:  1976/09/06               BSA:          1.853 m Patient Age:    80 years               BP:           96/55 mmHg Patient Gender: F                      HR:           53 bpm. Exam Location:  Inpatient Procedure: Limited Echo, Color Doppler and Saline Contrast Bubble Study Indications:    R06.02 SOB. Hypoxia  History:        Patient has prior history of Echocardiogram examinations, most                 recent 06/19/2022. Abnormal ECG, Signs/Symptoms:Altered Mental                 Status, Murmur, Shortness of Breath and Dyspnea; Risk                 Factors:Hypertension. ETOH.  Sonographer:    [place] Referring Phys: [id number] [place]  1. Left ventricular ejection fraction, by estimation, is 55 to 60%. The left ventricle has normal function.  2. Agitated saline contrast bubble study was positive with shunting observed within 3-6 cardiac cycles suggestive of interatrial shunt. FINDINGS  Left Ventricle: Left ventricular ejection fraction, by estimation, is 55 to 60%. The left ventricle has normal function. IAS/Shunts: Agitated saline contrast was given intravenously to  evaluate for intracardiac shunting. Agitated saline contrast bubble study was positive with shunting observed within 3-6 cardiac cycles suggestive of interatrial shunt. Kardie Tobb DO Electronically signed by Berniece Salines DO Signature Date/Time: 06/23/2022/2:22:10 PM     Final         Scheduled Meds:  budesonide (PULMICORT) nebulizer solution  0.5 mg Nebulization BID   Chlorhexidine Gluconate Cloth  6 each Topical Daily   fludrocortisone  0.1 mg Oral Daily   folic acid  1 mg Oral Daily   gabapentin  400 mg Oral TID   hydrocortisone  50 mg Oral BID   ipratropium-albuterol  3 mL Nebulization BID   lactulose  30 g Oral TID   midodrine  10 mg Oral TID WC   multivitamin with minerals  1 tablet Oral Daily   mupirocin ointment  1 Application Nasal BID   pantoprazole  40 mg Oral Daily   QUEtiapine  50 mg Oral QHS   thiamine  100 mg Oral Daily   Or   thiamine  100 mg Intravenous Daily   topiramate  100 mg Oral BID   Continuous Infusions:     LOS: 7 days   Time spent= 35 mins    Gracyn Allor Arsenio Loader, MD [facility]  If 7PM-7AM, please contact night-coverage  06/24/2022, 12:11 PM

## 2022-06-24 NOTE — Progress Notes (Signed)
Physical Therapy Treatment Patient Details Name: Sara [name] MRN: [id number] DOB: [birth year] Today's Date: 06/24/2022   History of Present Illness Sara [name] is a 46 y.o. female presenting to [facility] ED on 7/28 secondary to alcohol intoxication and related fall, became hypoxic put on oxygen and was also hypotensive. PMH: multiple admissions for alcohol detoxification, chronic cirrhosis secondary to alcohol abuse with 2-3 days at [facility], pancytopenia secondary to cirrhosis, depression anxiety, anemia, GERD, upper GI bleed.    PT Comments    The patient is ambulating independently in room to [place].   Refer to saturation walk test note.  Patient ambulated x 200' on RA, SPO2 >90%, then dropping to 85% on RA another 200'. Ambulated a total of 400'  on RA and then 2 [place] dropped.   Recommendations for follow up therapy are one component of a multi-disciplinary discharge planning process, led by the attending physician.  Recommendations may be updated based on patient status, additional functional criteria and insurance authorization.  Follow Up Recommendations  No PT follow up     Assistance Recommended at Discharge    Patient can return home with the following Assistance with cooking/housework;Assist for transportation   Equipment Recommendations  None recommended by PT    Recommendations for Other Services       Precautions / Restrictions Precautions Precautions: Fall Precaution Comments: and SpO2 (hypoxia)     Mobility  Bed Mobility Overal bed mobility: Independent                  Transfers Overall transfer level: Independent                      Ambulation/Gait Ambulation/Gait assistance: Supervision (for O@) Gait Distance (Feet): 800 Feet Assistive device: None Gait Pattern/deviations: WFL(Within Functional Limits)           Stairs             Wheelchair Mobility    Modified Rankin (Stroke Patients Only)       Balance  Overall balance assessment: Independent, No apparent balance deficits (not formally assessed)                                          Cognition Arousal/Alertness: Awake/alert Behavior During Therapy: WFL for tasks assessed/performed Overall Cognitive Status: Within Functional Limits for tasks assessed                                          Exercises      General Comments        Pertinent Vitals/Pain Pain Assessment Pain Assessment: No/denies pain    Home Living                          Prior Function            PT Goals (current goals can now be found in the care plan section) Progress towards PT goals: Progressing toward goals    Frequency    Min 3X/week      PT Plan Current plan remains appropriate    Co-evaluation              AM-PAC PT "6 Clicks" Mobility   Outcome Measure  Help needed turning from your  back to your side while in a flat bed without using bedrails?: None Help needed moving from lying on your back to sitting on the side of a flat bed without using bedrails?: None Help needed moving to and from a bed to a chair (including a wheelchair)?: None Help needed standing up from a chair using your arms (e.g., wheelchair or bedside chair)?: None Help needed to walk in hospital room?: None Help needed climbing 3-5 steps with a railing? : A Little 6 Click Score: 23    End of Session Equipment Utilized During Treatment: Oxygen Activity Tolerance: Patient tolerated treatment well;No increased pain Patient left: in chair;with call bell/phone within reach Nurse Communication: Mobility status PT Visit Diagnosis: Difficulty in walking, not elsewhere classified (R26.2)     Time: 7915-0569 PT Time Calculation (min) (ACUTE ONLY): 24 min  Charges:  $Gait Training: 23-37 mins                     Sara [name] [place] Office [phone] Weekend  pager-[phone]    Sara [name] 06/24/2022, 12:45 PM

## 2022-06-24 NOTE — Progress Notes (Signed)
SATURATION QUALIFICATIONS: (This note is used to comply with regulatory documentation for home oxygen)  Patient Saturations on Room Air at Rest = 92%  Patient Saturations on Room Air while Ambulating = 85%  Patient Saturations on  2 Liters of oxygen while Ambulating = 90%%  Please briefly explain why patient needs home oxygen:To  maintain saturation >88% while performing activities such as ambulation and ADL's. [place] Office [phone] Weekend [phone]

## 2022-06-25 ENCOUNTER — Encounter: Payer: Self-pay | Admitting: Hematology and Oncology

## 2022-06-25 ENCOUNTER — Other Ambulatory Visit (HOSPITAL_COMMUNITY): Payer: Self-pay

## 2022-06-25 ENCOUNTER — Other Ambulatory Visit: Payer: Self-pay

## 2022-06-25 LAB — CBC
HCT: 40.9 % (ref 36.0–46.0)
Hemoglobin: 13.3 g/dL (ref 12.0–15.0)
MCH: 29 pg (ref 26.0–34.0)
MCHC: 32.5 g/dL (ref 30.0–36.0)
MCV: 89.3 fL (ref 80.0–100.0)
Platelets: 90 10*3/uL — ABNORMAL LOW (ref 150–400)
RBC: 4.58 MIL/uL (ref 3.87–5.11)
RDW: 17 % — ABNORMAL HIGH (ref 11.5–15.5)
WBC: 5.3 10*3/uL (ref 4.0–10.5)
nRBC: 0 % (ref 0.0–0.2)

## 2022-06-25 LAB — BASIC METABOLIC PANEL
Anion gap: 8 (ref 5–15)
BUN: 10 mg/dL (ref 6–20)
CO2: 19 mmol/L — ABNORMAL LOW (ref 22–32)
Calcium: 9.1 mg/dL (ref 8.9–10.3)
Chloride: 112 mmol/L — ABNORMAL HIGH (ref 98–111)
Creatinine, Ser: 0.49 mg/dL (ref 0.44–1.00)
GFR, Estimated: >60 mL/min (ref >60)
Glucose, Bld: 144 mg/dL — ABNORMAL HIGH (ref 70–99)
Potassium: 3.1 mmol/L — ABNORMAL LOW (ref 3.5–5.1)
Sodium: 139 mmol/L (ref 135–145)

## 2022-06-25 LAB — MAGNESIUM: Magnesium: 2 mg/dL (ref 1.7–2.4)

## 2022-06-25 MED ORDER — MIDODRINE HCL 10 MG PO TABS
10.0000 mg | ORAL_TABLET | Freq: Three times a day (TID) | ORAL | 0 refills | Status: AC
  Filled 2022-06-25: qty 90, 30d supply, fill #0

## 2022-06-25 MED ORDER — LACTULOSE 10 GM/15ML PO SOLN
30.0000 g | Freq: Three times a day (TID) | ORAL | 0 refills | Status: AC
  Filled 2022-06-25: qty 4050, 30d supply, fill #0

## 2022-06-25 MED ORDER — FLUDROCORTISONE ACETATE 0.1 MG PO TABS: 0.1000 mg | ORAL_TABLET | Freq: Every day | ORAL | 0 refills | Status: DC

## 2022-06-25 MED ORDER — MIDODRINE HCL 10 MG PO TABS: 10.0000 mg | ORAL_TABLET | Freq: Three times a day (TID) | ORAL | 0 refills | Status: DC

## 2022-06-25 MED ORDER — THIAMINE HCL 100 MG PO TABS
100.0000 mg | ORAL_TABLET | Freq: Every day | ORAL | 0 refills | Status: DC
  Filled 2022-06-25: qty 30, 30d supply, fill #0

## 2022-06-25 MED ORDER — PANTOPRAZOLE SODIUM 40 MG PO TBEC
40.0000 mg | DELAYED_RELEASE_TABLET | Freq: Every day | ORAL | 0 refills | Status: AC
  Filled 2022-06-25: qty 30, 30d supply, fill #0

## 2022-06-25 MED ORDER — ALBUTEROL SULFATE HFA 108 (90 BASE) MCG/ACT IN AERS: 2.0000 | INHALATION_SPRAY | Freq: Four times a day (QID) | RESPIRATORY_TRACT | 2 refills | Status: DC | PRN

## 2022-06-25 MED ORDER — HYDROCORTISONE 10 MG PO TABS
50.0000 mg | ORAL_TABLET | Freq: Two times a day (BID) | ORAL | 0 refills | Status: AC
  Filled 2022-06-25: qty 300, 30d supply, fill #0

## 2022-06-25 MED ORDER — LACTULOSE 10 GM/15ML PO SOLN: 30.0000 g | Freq: Three times a day (TID) | ORAL | 0 refills | Status: DC

## 2022-06-25 MED ORDER — POTASSIUM CHLORIDE 20 MEQ PO PACK
40.0000 meq | PACK | Freq: Once | ORAL | Status: AC
  Administered 2022-06-25: 40 meq via ORAL
  Filled 2022-06-25: qty 2

## 2022-06-25 MED ORDER — HYDROCORTISONE 10 MG PO TABS: 50.0000 mg | ORAL_TABLET | Freq: Two times a day (BID) | ORAL | 0 refills | Status: DC

## 2022-06-25 MED ORDER — PANTOPRAZOLE SODIUM 40 MG PO TBEC: 40.0000 mg | DELAYED_RELEASE_TABLET | Freq: Every day | ORAL | 0 refills | Status: DC

## 2022-06-25 MED ORDER — FOLIC ACID 1 MG PO TABS
1.0000 mg | ORAL_TABLET | Freq: Every day | ORAL | 0 refills | Status: DC
  Filled 2022-06-25: qty 30, 30d supply, fill #0

## 2022-06-25 MED ORDER — POTASSIUM CHLORIDE 10 MEQ/100ML IV SOLN
10.0000 meq | INTRAVENOUS | Status: AC
  Administered 2022-06-25 (×4): 10 meq via INTRAVENOUS
  Filled 2022-06-25 (×4): qty 100

## 2022-06-25 MED ORDER — FOLIC ACID 1 MG PO TABS: 1.0000 mg | ORAL_TABLET | Freq: Every day | ORAL | 0 refills | Status: DC

## 2022-06-25 MED ORDER — THIAMINE HCL 100 MG PO TABS: 100.0000 mg | ORAL_TABLET | Freq: Every day | ORAL | 0 refills | Status: DC

## 2022-06-25 MED ORDER — FLUDROCORTISONE ACETATE 0.1 MG PO TABS
0.1000 mg | ORAL_TABLET | Freq: Every day | ORAL | 0 refills | Status: DC
  Filled 2022-06-25: qty 30, 30d supply, fill #0

## 2022-06-25 MED ORDER — ALBUTEROL SULFATE HFA 108 (90 BASE) MCG/ACT IN AERS
2.0000 | INHALATION_SPRAY | Freq: Four times a day (QID) | RESPIRATORY_TRACT | 2 refills | Status: DC | PRN
  Filled 2022-06-25: qty 8, fill #0

## 2022-06-25 NOTE — Care Management (Signed)
  [facility] Medication Assistance Card  Name: Sara [name] (MRN): [id number]   Birthdate in [place] is listed as 08/13/1945 Bin: [id number] RX Group: [id number]  Discharge Date: 06/25/2022  Expiration Date: 07/03/2022  (must be filled within 7 days of discharge)     Dear Sara [name]:  You have been approved to have the prescriptions written by your discharging physician filled through our [facility] (Medication Assistance Through [facility]) program. This program allows for a one-time (no refills) 34-day supply of selected medications for a low copay amount.  The copay is $3.00 per prescription. For instance, if you have one prescription, you will pay $3.00; for two prescriptions, you pay $6.00; for three prescriptions, you pay $9.00; and so on.  Only certain pharmacies are participating in this program with [facility]. You will need to select one of the pharmacies from the attached list and take your prescriptions, this letter, and your photo ID to one of the participating pharmacies.   We are excited that you are able to use the [facility] program to get your medications. These prescriptions must be filled within 7 days of hospital discharge or they will no longer be valid for the [facility] program. Should you have any problems with your prescriptions please contact your case management team member at [phone] for [place] you,    [place]

## 2022-06-25 NOTE — Discharge Summary (Signed)
Physician Discharge Summary  Gursimran Litaker UJW:[id number] DOB: 03/16/1976 DOA: 06/17/2022  PCP: Pcp, No  Admit date: 06/17/2022 Discharge date: 06/25/2022  Admitted From: Home Disposition: Home  Recommendations for Outpatient Follow-up:  Follow up with PCP in 1-2 weeks Please obtain BMP/CBC in one week your next doctors visit.  Counseled to quit drinking alcohol Will need outpatient follow-up with endocrine for evaluation of adrenal insufficiency.  Currently prescribed Florinef, midodrine, hydrocortisone Folic acid, thiamine and multivitamin advised As needed albuterol prescribed PPI daily Match letter has been given by social worker to help with her medications.  Home Health: No need for therapy Equipment/Devices: Home oxygen Discharge Condition: Stable CODE STATUS: Full code Diet recommendation: Regular low-salt  Brief/Interim Summary: 46 year old with history of alcohol abuse, cirrhosis, multiple admissions for alcohol detox, upper GI bleed, anemia, anxiety, depression, PTSD, pancytopenia, GERD comes to the ED by [facility] due to alcohol intoxication.  She drinks about 10 beers and about 1-2 bottles of wine daily.  Initially noted to be hypotensive and hypoxic requiring IV fluids.  UA showed mild urinary tract infection, ammonia level 78, alcohol level 289, potassium 3.2.  CTA chest was negative.  Echocardiogram overall is unremarkable, getting midodrine to help with blood pressures.  Pulmonary team consulted for their input as well.  Limited echocardiogram with bubble study showed positive for ASD shunt, case discussed with cardiology but did not recommend any further work-up at this time.  Eventually patient's blood pressure stabilized on medications mentioned above and she is medically stable for discharge today.  PT/OT did not have any further recommendation.  With ambulatory pulse ox dropping less than 88%, home oxygen was arranged.     Assessment & Plan:  Principal Problem:    Acute respiratory failure with hypoxia (HCC) Active Problems:   Generalized anxiety disorder   GERD (gastroesophageal reflux disease)   Alcohol abuse with intoxication (HCC)   Thrombocytopenia (HCC)   Hypotension   Hypokalemia   Bipolar 1 disorder (HCC)   Aspiration pneumonia (HCC)   Acute respiratory failure (HCC)   Acute respiratory failure with hypoxia ([place]) secondary to Aspiration pneumonitis (HCC) Hypovolemic shock with some concerns of adrenal insuff.  -Initially patient had hypovolemic shock. Scheduled and as needed bronchodilators.  I-S/flutter valve.   Echocardiogram unremarkable.  Continue midodrine 10 mg 3 times daily, pressures have improved.  Pulmonary team following.  Now on 2 L nasal cannula. Concerns of adrenal insufficiency, on hydrocortisone 50 mg BID and daily Florinef.  Needs outpatient follow-up with endocrinology. No right heart cath indicated per cardiology   ASD, positive shunt - Limited echo bubble study done which showed positive ASD.  Case discussed with Dr. Harl Bowie from cardiology, no further work-up indicated at this time.     Alcohol abuse with intoxication ([place]) Completed Librium taper.  Supportive care.   Hypokalemia/hypomagnesemia - Improved   Lactic acidosis -Resolved with fluids   Alcoholic liver cirrhosis Hepatic encephalopathy, resolved - Elevated ammonia level.  Lactulose 3 times daily.  Encouraged her to take this      Generalized anxiety disorder   Bipolar 1 disorder ([place]) Seroquel '50mg'$  qhs ('100mg'$  at home)     GERD (gastroesophageal reflux disease) History of GI bleed Daily PPI      Thrombocytopenia (HCC) Pancytopenia Likely from underlying cirrhosis.  No evidence of bleeding.  Continue to monitor            Discharge Diagnoses:  Principal Problem:   Acute respiratory failure with hypoxia (HCC) Active Problems:   Generalized  anxiety disorder   GERD (gastroesophageal reflux disease)   Alcohol abuse with intoxication  (HCC)   Thrombocytopenia (HCC)   Hypotension   Hypokalemia   Bipolar 1 disorder (HCC)   Aspiration pneumonia ([place])   Acute respiratory failure ([place])      Consultations: Pulmonary  Subjective: Feels great no complaints.  Discharge Exam: Vitals:   06/25/22 0602 06/25/22 0805  BP: 105/72   Pulse: (!) 57   Resp: 18   Temp: 97.8 F (36.6 C)   SpO2: 94% 95%   Vitals:   06/24/22 1527 06/24/22 2124 06/25/22 0602 06/25/22 0805  BP: 120/82 112/73 105/72   Pulse: (!) 50 68 (!) 57   Resp: '18 18 18   '$ Temp: 98.2 F (36.8 C) 97.8 F (36.6 C) 97.8 F (36.6 C)   TempSrc: Oral Oral Oral   SpO2: 92% 92% 94% 95%  Weight:      Height:        General: Pt is alert, awake, not in acute distress Cardiovascular: RRR, S1/S2 +, no rubs, no gallops Respiratory: CTA bilaterally, no wheezing, no rhonchi Abdominal: Soft, NT, ND, bowel sounds + Extremities: no edema, no cyanosis  Discharge Instructions   Allergies as of 06/25/2022       Reactions   Gluten Meal Other (See Comments)   Due to celiac disease - bloated, stomach issues    Morphine And Related Other (See Comments)   Slowed HR, lowered BP   Nsaids Other (See Comments)   Caused internal bleeding        Medication List     STOP taking these medications    benzocaine 10 % mucosal gel Commonly known as: ORAJEL   chlordiazePOXIDE 25 MG capsule Commonly known as: LIBRIUM   multivitamin with minerals Tabs tablet   prazosin 1 MG capsule Commonly known as: MINIPRESS   ramelteon 8 MG tablet Commonly known as: ROZEREM       TAKE these medications    albuterol 108 (90 Base) MCG/ACT inhaler Commonly known as: VENTOLIN HFA Inhale 2 puffs into the lungs every 6 (six) hours as needed for wheezing or shortness of breath.   escitalopram 20 MG tablet Commonly known as: LEXAPRO Take 1 tablet (20 mg total) by mouth once daily.   fludrocortisone 0.1 MG tablet Commonly known as: FLORINEF Take 1 tablet (0.1 mg total)  by mouth daily. Start taking on: June 26, 8294   folic acid 1 MG tablet Commonly known as: FOLVITE Take 1 tablet (1 mg total) by mouth daily. Start taking on: June 26, 2022   gabapentin 400 MG capsule Commonly known as: NEURONTIN Take 1 capsule (400 mg total) by mouth 3 (three) times daily.   hydrocortisone 10 MG tablet Commonly known as: CORTEF Take 5 tablets (50 mg total) by mouth 2 (two) times daily.   hydrOXYzine 50 MG tablet Commonly known as: ATARAX Take 1 tablet (50 mg total) by mouth 3 (three) times daily as needed for anxiety.   lactulose 10 GM/15ML solution Commonly known as: CHRONULAC Take 45 mLs (30 g total) by mouth 3 (three) times daily.   midodrine 10 MG tablet Commonly known as: PROAMATINE Take 1 tablet (10 mg total) by mouth 3 (three) times daily with meals.   pantoprazole 40 MG tablet Commonly known as: PROTONIX Take 1 tablet (40 mg total) by mouth daily before breakfast.   QUEtiapine 100 MG tablet Commonly known as: SEROQUEL Take 1 tablet (100 mg total) by mouth once nightly at bedtime.  thiamine 100 MG tablet Commonly known as: VITAMIN B1 Take 1 tablet (100 mg total) by mouth daily. Start taking on: June 26, 2022   topiramate 100 MG tablet Commonly known as: Topamax Take 1 tablet (100 mg total) by mouth 2 (two) times daily.               Durable Medical Equipment  (From admission, onward)           Start     Ordered   06/25/22 0733  For home use only DME oxygen  Once       Comments: Aspiration pneumonia along with likely obstructive sleep apnea.  Question Answer Comment  Length of Need 6 Months   Mode or (Route) Nasal cannula   Liters per Minute 2   Frequency Continuous (stationary and portable oxygen unit needed)   Oxygen delivery system Gas      06/25/22 0733            Allergies  Allergen Reactions   Gluten Meal Other (See Comments)    Due to celiac disease - bloated, stomach issues    Morphine And Related  Other (See Comments)    Slowed HR, lowered BP   Nsaids Other (See Comments)    Caused internal bleeding    You were cared for by a hospitalist during your hospital stay. If you have any questions about your discharge medications or the care you received while you were in the hospital after you are discharged, you can call the unit and asked to speak with the hospitalist on call if the hospitalist that took care of you is not available. Once you are discharged, your primary care physician will handle any further medical issues. Please note that no refills for any discharge medications will be authorized once you are discharged, as it is imperative that you return to your primary care physician (or establish a relationship with a primary care physician if you do not have one) for your aftercare needs so that they can reassess your need for medications and monitor your lab values.   Procedures/Studies: ECHOCARDIOGRAM LIMITED BUBBLE STUDY  Result Date: 06/23/2022    ECHOCARDIOGRAM LIMITED REPORT   Patient Name:   DIANEY SUCHY Date of Exam: 06/23/2022 Medical Rec #:  [id number]              Height:       64.0 in Accession #:    [id number]             Weight:       175.9 lb Date of Birth:  1976-06-28               BSA:          1.853 m Patient Age:    46 years               BP:           96/55 mmHg Patient Gender: F                      HR:           53 bpm. Exam Location:  Inpatient Procedure: Limited Echo, Color Doppler and Saline Contrast Bubble Study Indications:    R06.02 SOB. Hypoxia  History:        Patient has prior history of Echocardiogram examinations, most                 recent 06/19/2022. Abnormal  ECG, Signs/Symptoms:Altered Mental                 Status, Murmur, Shortness of Breath and Dyspnea; Risk                 Factors:Hypertension. ETOH.  Sonographer:    [place] Referring Phys: [id number] [place]  1. Left ventricular ejection fraction, by estimation, is 55 to  60%. The left ventricle has normal function.  2. Agitated saline contrast bubble study was positive with shunting observed within 3-6 cardiac cycles suggestive of interatrial shunt. FINDINGS  Left Ventricle: Left ventricular ejection fraction, by estimation, is 55 to 60%. The left ventricle has normal function. IAS/Shunts: Agitated saline contrast was given intravenously to evaluate for intracardiac shunting. Agitated saline contrast bubble study was positive with shunting observed within 3-6 cardiac cycles suggestive of interatrial shunt. Godfrey Pick Tobb DO Electronically signed by Berniece Salines DO Signature Date/Time: 06/23/2022/2:22:10 PM    Final    ECHOCARDIOGRAM COMPLETE  Result Date: 06/19/2022    ECHOCARDIOGRAM REPORT   Patient Name:   SHAWNEEN DEETZ Date of Exam: 06/19/2022 Medical Rec #:  [id number]              Height:       64.0 in Accession #:    [id number]             Weight:       175.9 lb Date of Birth:  06/23/1976               BSA:          1.853 m Patient Age:    35 years               BP:           121/68 mmHg Patient Gender: F                      HR:           68 bpm. Exam Location:  Inpatient Procedure: 2D Echo, 3D Echo, Cardiac Doppler and Color Doppler Indications:    I42.9 Cardiomyopathy (unspecified)  History:        Patient has no prior history of Echocardiogram examinations.                 Signs/Symptoms:Altered Mental Status, Dyspnea, Shortness of                 Breath, Hypotension and Murmur. ETOH.  Sonographer:    Roseanna Rainbow RDCS Referring Phys: [id number] [facility]  Sonographer Comments: Suboptimal parasternal window. IMPRESSIONS  1. Left ventricular ejection fraction, by estimation, is 55 to 60%. The left ventricle has normal function. The left ventricle has no regional wall motion abnormalities. Left ventricular diastolic parameters were normal.  2. Right ventricular systolic function is normal. The right ventricular size is normal. There is normal pulmonary artery systolic  pressure.  3. The mitral valve is normal in structure. No evidence of mitral valve regurgitation.  4. The aortic valve was not well visualized. Aortic valve regurgitation is not visualized.  5. The inferior vena cava is normal in size with greater than 50% respiratory variability, suggesting right atrial pressure of 3 mmHg. Comparison(s): No prior Echocardiogram. FINDINGS  Left Ventricle: Left ventricular ejection fraction, by estimation, is 55 to 60%. The left ventricle has normal function. The left ventricle has no regional wall motion abnormalities. The left ventricular internal cavity size was normal  in size. There is  no left ventricular hypertrophy. Left ventricular diastolic parameters were normal. Right Ventricle: The right ventricular size is normal. Right vetricular wall thickness was not well visualized. Right ventricular systolic function is normal. There is normal pulmonary artery systolic pressure. The tricuspid regurgitant velocity is 2.15 m/s, and with an assumed right atrial pressure of 3 mmHg, the estimated right ventricular systolic pressure is 23.5 mmHg. Left Atrium: Left atrial size was normal in size. Right Atrium: Right atrial size was normal in size. Pericardium: There is no evidence of pericardial effusion. Mitral Valve: The mitral valve is normal in structure. No evidence of mitral valve regurgitation. Tricuspid Valve: The tricuspid valve is normal in structure. Tricuspid valve regurgitation is not demonstrated. Aortic Valve: The aortic valve was not well visualized. Aortic valve regurgitation is not visualized. Pulmonic Valve: The pulmonic valve was not well visualized. Pulmonic valve regurgitation is not visualized. Aorta: The aortic root and ascending aorta are structurally normal, with no evidence of dilitation. Venous: The inferior vena cava is normal in size with greater than 50% respiratory variability, suggesting right atrial pressure of 3 mmHg. IAS/Shunts: The interatrial septum  was not well visualized.  LEFT VENTRICLE PLAX 2D LVIDd:         4.90 cm      Diastology LVIDs:         3.10 cm      LV e' medial:    9.25 cm/s LV PW:         1.10 cm      LV E/e' medial:  12.1 LV IVS:        1.00 cm      LV e' lateral:   14.50 cm/s LVOT diam:     2.20 cm      LV E/e' lateral: 7.7 LV SV:         111 LV SV Index:   60 LVOT Area:     3.80 cm                              3D Volume EF: LV Volumes (MOD)            3D EF:        59 % LV vol d, MOD A2C: 169.0 ml LV EDV:       281 ml LV vol d, MOD A4C: 144.0 ml LV ESV:       115 ml LV vol s, MOD A2C: 91.9 ml  LV SV:        165 ml LV vol s, MOD A4C: 64.5 ml LV SV MOD A2C:     77.1 ml LV SV MOD A4C:     144.0 ml LV SV MOD BP:      76.3 ml RIGHT VENTRICLE             IVC RV S prime:     14.10 cm/s  IVC diam: 2.50 cm TAPSE (M-mode): 3.0 cm LEFT ATRIUM             Index        RIGHT ATRIUM           Index LA diam:        2.90 cm 1.57 cm/m   RA Area:     12.20 cm LA Vol (A2C):   58.8 ml 31.74 ml/m  RA Volume:   27.90 ml  15.06 ml/m LA Vol (A4C):   54.8 ml  29.58 ml/m LA Biplane Vol: 61.3 ml 33.09 ml/m  AORTIC VALVE LVOT Vmax:   128.00 cm/s LVOT Vmean:  85.100 cm/s LVOT VTI:    0.293 m  AORTA Ao Root diam: 3.10 cm Ao Asc diam:  3.30 cm MITRAL VALVE                TRICUSPID VALVE MV Area (PHT): 3.71 cm     TR Peak grad:   18.5 mmHg MV Decel Time: 205 msec     TR Vmax:        215.00 cm/s MV E velocity: 111.50 cm/s MV A velocity: 63.85 cm/s   SHUNTS MV E/A ratio:  1.75         Systemic VTI:  0.29 m                             Systemic Diam: 2.20 cm Phineas Inches Electronically signed by Phineas Inches Signature Date/Time: 06/19/2022/11:55:50 AM    Final    DG Chest Port 1 View  Result Date: 06/19/2022 CLINICAL DATA:  Shortness of breath EXAM: PORTABLE CHEST 1 VIEW COMPARISON:  06/17/2022 and prior studies FINDINGS: UPPER limits normal heart size again noted. There is no evidence of focal airspace disease, pulmonary edema, suspicious pulmonary nodule/mass, pleural  effusion, or pneumothorax. No acute bony abnormalities are identified. IMPRESSION: No active disease. Electronically Signed   By: Margarette Canada M.D.   On: 06/19/2022 09:31   CT Angio Chest PE W and/or Wo Contrast  Result Date: 06/17/2022 CLINICAL DATA:  46 year old female with history of abnormal venous blood gas and clinical concern for pulmonary embolism. Alcohol intoxication. EXAM: CT ANGIOGRAPHY CHEST WITH CONTRAST TECHNIQUE: Multidetector CT imaging of the chest was performed using the standard protocol during bolus administration of intravenous contrast. Multiplanar CT image reconstructions and MIPs were obtained to evaluate the vascular anatomy. RADIATION DOSE REDUCTION: This exam was performed according to the departmental dose-optimization program which includes automated exposure control, adjustment of the mA and/or kV according to patient size and/or use of iterative reconstruction technique. CONTRAST:  73m OMNIPAQUE IOHEXOL 350 MG/ML SOLN COMPARISON:  Chest CTA 06/23/2020. FINDINGS: Cardiovascular: No filling defects are noted in the pulmonary arterial tree to suggest pulmonary embolism. Heart size is borderline enlarged. There is no significant pericardial fluid, thickening or pericardial calcification. Aortic atherosclerosis. No definite coronary artery calcifications. Mediastinum/Nodes: No pathologically enlarged mediastinal or hilar lymph nodes. Esophagus is unremarkable in appearance. No axillary lymphadenopathy. Lungs/Pleura: Dependent areas of subsegmental atelectasis are noted in the lower lobes of both lungs. No confluent consolidative airspace disease. No pleural effusions. No definite suspicious appearing pulmonary nodules or masses are noted. Upper Abdomen: Liver has a shrunken appearance and nodular contour, indicative of underlying cirrhosis. Musculoskeletal: There are no aggressive appearing lytic or blastic lesions noted in the visualized portions of the skeleton. Review of the MIP  images confirms the above findings. IMPRESSION: 1. No evidence of pulmonary embolism. 2. Extensive dependent subsegmental atelectasis in the lower lobes of the lungs bilaterally. 3. Cirrhosis. 4. Aortic atherosclerosis. Aortic Atherosclerosis (ICD10-I70.0). Electronically Signed   By: DVinnie LangtonM.D.   On: 06/17/2022 06:44   DG Chest Port 1 View  Result Date: 06/17/2022 CLINICAL DATA:  Questionable sepsis. EXAM: PORTABLE CHEST 1 VIEW COMPARISON:  January 09, 2022 FINDINGS: Limited study secondary to patient rotation. The heart size and mediastinal contours are within normal limits. Low lung volumes are noted. There is no evidence of acute  infiltrate, pleural effusion or pneumothorax. The visualized skeletal structures are unremarkable. IMPRESSION: Low lung volumes without acute or active cardiopulmonary disease. Electronically Signed   By: Virgina Norfolk M.D.   On: 06/17/2022 04:28     The results of significant diagnostics from this hospitalization (including imaging, microbiology, ancillary and laboratory) are listed below for reference.     Microbiology: Recent Results (from the past 240 hour(s))  Blood Culture (routine x 2)     Status: None   Collection Time: 06/17/22  3:31 AM   Specimen: BLOOD  Result Value Ref Range Status   Specimen Description   Final    BLOOD BLOOD LEFT FOREARM Performed at [facility] [address], [place], [place] [address]    Special Requests   Final    BOTTLES DRAWN AEROBIC AND ANAEROBIC Blood Culture adequate volume Performed at [facility] [address] [place], [place] [address]    Culture   Final    NO GROWTH 5 DAYS Performed at [facility], [place] [address], [place], [place] [address]    Report Status 06/22/2022 FINAL  Final  Blood Culture (routine x 2)     Status: None   Collection Time: 06/17/22  3:55 AM   Specimen: BLOOD  Result Value Ref Range Status   Specimen Description   Final     BLOOD BLOOD RIGHT FOREARM Performed at [facility] [address], [place], [place] [address]    Special Requests   Final    BOTTLES DRAWN AEROBIC AND ANAEROBIC Blood Culture results may not be optimal due to an excessive volume of blood received in culture bottles Performed at [facility] [address], [place], [place] [address]    Culture   Final    NO GROWTH 5 DAYS Performed at [facility], [place] [address] [place], [place] [address]    Report Status 06/22/2022 FINAL  Final  MRSA Next Gen by PCR, Nasal     Status: Abnormal   Collection Time: 06/17/22  8:53 AM   Specimen: Nasal Mucosa; Nasal Swab  Result Value Ref Range Status   MRSA by PCR Next Gen DETECTED (A) NOT DETECTED Final    Comment: (NOTE) The GeneXpert MRSA Assay (FDA approved for NASAL specimens only), is one component of a comprehensive MRSA colonization surveillance program. It is not intended to diagnose MRSA infection nor to guide or monitor treatment for MRSA infections. Test performance is not FDA approved in patients less than 34 years old. Performed at [facility], [place] [address], [place], [place] [address]      Labs: BNP (last 3 results) Recent Labs    06/18/22 0828  BNP 194.1*   Basic Metabolic Panel: Recent Labs  Lab 06/21/22 0946 06/22/22 0229 06/23/22 0202 06/24/22 0213 06/25/22 0524  NA 139 137 140 140 139  K 3.6 3.5 3.2* 3.2* 3.1*  CL 112* 110 113* 109 112*  CO2 18* 20* 20* 21* 19*  GLUCOSE 125* 154* 133* 142* 144*  BUN '12 11 9 10 10  '$ CREATININE 0.54 0.55 0.38* 0.40* 0.49  CALCIUM 10.3 9.3 9.3 8.9 9.1  MG 2.0 1.9 2.0 1.9 2.0   Liver Function Tests: Recent Labs  Lab 06/19/22 0500 06/20/22 0500 06/21/22 0946 06/22/22 0229 06/23/22 0202  AST 39 '29 28 24 27  '$ ALT '19 22 22 19 22  '$ ALKPHOS 78 91 87 82 73  BILITOT 2.0* 1.7* 1.3* 1.3* 1.1  PROT 6.8 7.7 7.9 7.6 7.2  ALBUMIN 3.6 3.8 3.9 3.7 3.7   No results for  input(s): "LIPASE", "AMYLASE" in the last 168 hours. Recent Labs  Lab 06/22/22 0229 06/23/22 0202  AMMONIA 71* 72*   CBC: Recent Labs  Lab 06/21/22 0946 06/22/22 0229 06/23/22 0202 06/24/22 0213 06/25/22 0524  WBC 4.7 4.8 5.6 5.0 5.3  HGB 13.4 13.0 12.7 13.1 13.3  HCT 41.9 39.5 38.9 40.5 40.9  MCV 90.3 89.2 88.8 90.4 89.3  PLT 75* 95* 95* 92* 90*   Cardiac Enzymes: No results for input(s): "CKTOTAL", "CKMB", "CKMBINDEX", "TROPONINI" in the last 168 hours. BNP: Invalid input(s): "POCBNP" CBG: No results for input(s): "GLUCAP" in the last 168 hours. D-Dimer No results for input(s): "DDIMER" in the last 72 hours. Hgb A1c No results for input(s): "HGBA1C" in the last 72 hours. Lipid Profile No results for input(s): "CHOL", "HDL", "LDLCALC", "TRIG", "CHOLHDL", "LDLDIRECT" in the last 72 hours. Thyroid function studies No results for input(s): "TSH", "T4TOTAL", "T3FREE", "THYROIDAB" in the last 72 hours.  Invalid input(s): "FREET3" Anemia work up No results for input(s): "VITAMINB12", "FOLATE", "FERRITIN", "TIBC", "IRON", "RETICCTPCT" in the last 72 hours. Urinalysis    Component Value Date/Time   COLORURINE YELLOW 06/17/2022 0326   APPEARANCEUR CLEAR 06/17/2022 0326   LABSPEC 1.016 06/17/2022 0326   PHURINE 5.0 06/17/2022 0326   GLUCOSEU NEGATIVE 06/17/2022 0326   HGBUR NEGATIVE 06/17/2022 0326   BILIRUBINUR NEGATIVE 06/17/2022 0326   BILIRUBINUR Neg 05/08/2015 0919   KETONESUR NEGATIVE 06/17/2022 0326   PROTEINUR NEGATIVE 06/17/2022 0326   UROBILINOGEN 0.2 07/28/2015 1711   NITRITE NEGATIVE 06/17/2022 0326   LEUKOCYTESUR SMALL (A) 06/17/2022 0326   Sepsis Labs Recent Labs  Lab 06/22/22 0229 06/23/22 0202 06/24/22 0213 06/25/22 0524  WBC 4.8 5.6 5.0 5.3   Microbiology Recent Results (from the past 240 hour(s))  Blood Culture (routine x 2)     Status: None   Collection Time: 06/17/22  3:31 AM   Specimen: BLOOD  Result Value Ref Range Status   Specimen  Description   Final    BLOOD BLOOD LEFT FOREARM Performed at [facility], [place] [address], [place], [place] [address]    Special Requests   Final    BOTTLES DRAWN AEROBIC AND ANAEROBIC Blood Culture adequate volume Performed at [facility] [address], [place], [name] [address]    Culture   Final    NO GROWTH 5 DAYS Performed at [facility], [place] [address], [place], [place] [address]    Report Status 06/22/2022 FINAL  Final  Blood Culture (routine x 2)     Status: None   Collection Time: 06/17/22  3:55 AM   Specimen: BLOOD  Result Value Ref Range Status   Specimen Description   Final    BLOOD BLOOD RIGHT FOREARM Performed at [facility] [address], [place], [place] [address]    Special Requests   Final    BOTTLES DRAWN AEROBIC AND ANAEROBIC Blood Culture results may not be optimal due to an excessive volume of blood received in culture bottles Performed at [facility] [address] [address], [place], [place] [address]    Culture   Final    NO GROWTH 5 DAYS Performed at [facility], [place] [address] [place], [place] [address]    Report Status 06/22/2022 FINAL  Final  MRSA Next Gen by PCR, Nasal     Status: Abnormal  Collection Time: 06/17/22  8:53 AM   Specimen: Nasal Mucosa; Nasal Swab  Result Value Ref Range Status   MRSA by PCR Next Gen DETECTED (A) NOT DETECTED Final    Comment: (NOTE) The GeneXpert MRSA Assay (FDA approved for NASAL specimens only), is one component of a comprehensive MRSA colonization surveillance program. It is not intended to diagnose MRSA infection nor to guide or monitor treatment for MRSA infections. Test performance is not FDA approved in patients less than 27 years old. Performed at [facility], [place] [address] [place], [place] [address]      Time coordinating discharge:  I have spent 35 minutes face to face with the patient  and on the ward discussing the patients care, assessment, plan and disposition with other care givers. >50% of the time was devoted counseling the patient about the risks and benefits of treatment/Discharge disposition and coordinating care.   SIGNED:   Damita Lack, MD  [facility] 06/25/2022, 12:27 PM   If 7PM-7AM, please contact night-coverage
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
## 2022-06-25 NOTE — Plan of Care (Signed)
  Problem: Education: Goal: Knowledge of General Education information will improve Description: Including pain rating scale, medication(s)/side effects and non-pharmacologic comfort measures Outcome: Adequate for Discharge   Problem: Health Behavior/Discharge Planning: Goal: Ability to manage health-related needs will improve Outcome: Adequate for Discharge   Problem: Clinical Measurements: Goal: Ability to maintain clinical measurements within normal limits will improve Outcome: Adequate for Discharge Goal: Will remain free from infection Outcome: Adequate for Discharge Goal: Diagnostic test results will improve Outcome: Adequate for Discharge Goal: Respiratory complications will improve Outcome: Adequate for Discharge Goal: Cardiovascular complication will be avoided Outcome: Adequate for Discharge   Problem: Activity: Goal: Risk for activity intolerance will decrease Outcome: Adequate for Discharge   Problem: Nutrition: Goal: Adequate nutrition will be maintained Outcome: Adequate for Discharge   Problem: Coping: Goal: Level of anxiety will decrease Outcome: Adequate for Discharge   Problem: Elimination: Goal: Will not experience complications related to bowel motility Outcome: Adequate for Discharge Goal: Will not experience complications related to urinary retention Outcome: Adequate for Discharge   Problem: Pain Managment: Goal: General experience of comfort will improve Outcome: Adequate for Discharge   Problem: Safety: Goal: Ability to remain free from injury will improve Outcome: Adequate for Discharge   Problem: Skin Integrity: Goal: Risk for impaired skin integrity will decrease Outcome: Adequate for Discharge   Problem: Education: Goal: Knowledge of disease or condition will improve Outcome: Adequate for Discharge Goal: Understanding of discharge needs will improve Outcome: Adequate for Discharge   Problem: Health Behavior/Discharge  Planning: Goal: Ability to identify changes in lifestyle to reduce recurrence of condition will improve Outcome: Adequate for Discharge Goal: Identification of resources available to assist in meeting health care needs will improve Outcome: Adequate for Discharge   Problem: Physical Regulation: Goal: Complications related to the disease process, condition or treatment will be avoided or minimized Outcome: Adequate for Discharge   Problem: Safety: Goal: Ability to remain free from injury will improve Outcome: Adequate for Discharge   

## 2022-06-27 ENCOUNTER — Other Ambulatory Visit: Payer: Self-pay

## 2022-06-28 ENCOUNTER — Telehealth (HOSPITAL_COMMUNITY): Payer: No Payment, Other | Admitting: Physician Assistant

## 2022-06-28 ENCOUNTER — Encounter (HOSPITAL_COMMUNITY): Payer: Self-pay

## 2022-06-28 ENCOUNTER — Other Ambulatory Visit: Payer: Self-pay

## 2022-06-28 ENCOUNTER — Encounter: Payer: Self-pay | Admitting: Hematology and Oncology

## 2022-06-29 LAB — MISC LABCORP TEST (SEND OUT)
LabCorp test name: 21
Labcorp test code: 504805

## 2022-07-04 ENCOUNTER — Other Ambulatory Visit: Payer: Self-pay

## 2022-07-26 ENCOUNTER — Encounter: Payer: Self-pay | Admitting: Hematology and Oncology

## 2022-07-26 ENCOUNTER — Other Ambulatory Visit: Payer: Self-pay

## 2022-08-07 ENCOUNTER — Other Ambulatory Visit: Payer: Self-pay

## 2022-08-07 ENCOUNTER — Emergency Department (HOSPITAL_COMMUNITY)
Admission: EM | Admit: 2022-08-07 | Discharge: 2022-08-07 | Disposition: A | Discharge status: Home / Self Care | Payer: Self-pay | Attending: Emergency Medicine | Admitting: Emergency Medicine

## 2022-08-07 ENCOUNTER — Emergency Department (HOSPITAL_COMMUNITY): Payer: Self-pay

## 2022-08-07 ENCOUNTER — Encounter (HOSPITAL_COMMUNITY): Payer: Self-pay | Admitting: Emergency Medicine

## 2022-08-07 DIAGNOSIS — W228XXA Striking against or struck by other objects, initial encounter: Secondary | ICD-10-CM | POA: Insufficient documentation

## 2022-08-07 DIAGNOSIS — S4991XA Unspecified injury of right shoulder and upper arm, initial encounter: Secondary | ICD-10-CM | POA: Insufficient documentation

## 2022-08-07 DIAGNOSIS — E876 Hypokalemia: Secondary | ICD-10-CM | POA: Insufficient documentation

## 2022-08-07 DIAGNOSIS — F10120 Alcohol abuse with intoxication, uncomplicated: Secondary | ICD-10-CM | POA: Insufficient documentation

## 2022-08-07 DIAGNOSIS — F10920 Alcohol use, unspecified with intoxication, uncomplicated: Secondary | ICD-10-CM

## 2022-08-07 DIAGNOSIS — Z87891 Personal history of nicotine dependence: Secondary | ICD-10-CM | POA: Insufficient documentation

## 2022-08-07 LAB — BLOOD GAS, VENOUS
Acid-base deficit: 0.7 mmol/L (ref 0.0–2.0)
Bicarbonate: 23.4 mmol/L (ref 20.0–28.0)
O2 Saturation: 83.8 %
Patient temperature: 37
pCO2, Ven: 36 mmHg — ABNORMAL LOW (ref 44–60)
pH, Ven: 7.42 (ref 7.25–7.43)
pO2, Ven: 57 mmHg — ABNORMAL HIGH (ref 32–45)

## 2022-08-07 LAB — CBC WITH DIFFERENTIAL/PLATELET
Abs Immature Granulocytes: 0.02 10*3/uL (ref 0.00–0.07)
Basophils Absolute: 0 10*3/uL (ref 0.0–0.1)
Basophils Relative: 0 %
Eosinophils Absolute: 0.1 10*3/uL (ref 0.0–0.5)
Eosinophils Relative: 2 %
HCT: 36.8 % (ref 36.0–46.0)
Hemoglobin: 12.1 g/dL (ref 12.0–15.0)
Immature Granulocytes: 0 %
Lymphocytes Relative: 49 %
Lymphs Abs: 2.3 10*3/uL (ref 0.7–4.0)
MCH: 28.8 pg (ref 26.0–34.0)
MCHC: 32.9 g/dL (ref 30.0–36.0)
MCV: 87.6 fL (ref 80.0–100.0)
Monocytes Absolute: 0.4 10*3/uL (ref 0.1–1.0)
Monocytes Relative: 8 %
Neutro Abs: 1.9 10*3/uL (ref 1.7–7.7)
Neutrophils Relative %: 41 %
Platelets: 90 10*3/uL — ABNORMAL LOW (ref 150–400)
RBC: 4.2 MIL/uL (ref 3.87–5.11)
RDW: 18.5 % — ABNORMAL HIGH (ref 11.5–15.5)
WBC: 4.6 10*3/uL (ref 4.0–10.5)
nRBC: 0 % (ref 0.0–0.2)

## 2022-08-07 LAB — COMPREHENSIVE METABOLIC PANEL
ALT: 28 U/L (ref 0–44)
AST: 53 U/L — ABNORMAL HIGH (ref 15–41)
Albumin: 3.3 g/dL — ABNORMAL LOW (ref 3.5–5.0)
Alkaline Phosphatase: 101 U/L (ref 38–126)
Anion gap: 6 (ref 5–15)
BUN: 6 mg/dL (ref 6–20)
CO2: 21 mmol/L — ABNORMAL LOW (ref 22–32)
Calcium: 8.3 mg/dL — ABNORMAL LOW (ref 8.9–10.3)
Chloride: 115 mmol/L — ABNORMAL HIGH (ref 98–111)
Creatinine, Ser: 0.43 mg/dL — ABNORMAL LOW (ref 0.44–1.00)
GFR, Estimated: >60 mL/min (ref >60)
Glucose, Bld: 107 mg/dL — ABNORMAL HIGH (ref 70–99)
Potassium: 3 mmol/L — ABNORMAL LOW (ref 3.5–5.1)
Sodium: 142 mmol/L (ref 135–145)
Total Bilirubin: 0.9 mg/dL (ref 0.3–1.2)
Total Protein: 7 g/dL (ref 6.5–8.1)

## 2022-08-07 LAB — I-STAT BETA HCG BLOOD, ED (MC, WL, AP ONLY): I-stat hCG, quantitative: <5 m[IU]/mL (ref <5)

## 2022-08-07 LAB — ETHANOL: Alcohol, Ethyl (B): 366 mg/dL (ref <10)

## 2022-08-07 LAB — OSMOLALITY: Osmolality: 389 mOsm/kg (ref 275–295)

## 2022-08-07 MED ORDER — POTASSIUM CHLORIDE CRYS ER 20 MEQ PO TBCR
40.0000 meq | EXTENDED_RELEASE_TABLET | Freq: Once | ORAL | Status: AC
  Administered 2022-08-07: 40 meq via ORAL
  Filled 2022-08-07: qty 2

## 2022-08-07 MED ORDER — SODIUM CHLORIDE 0.9 % IV BOLUS
1000.0000 mL | Freq: Once | INTRAVENOUS | Status: AC
  Administered 2022-08-07: 1000 mL via INTRAVENOUS

## 2022-08-07 MED ORDER — ONDANSETRON HCL 4 MG/2ML IJ SOLN
4.0000 mg | Freq: Once | INTRAMUSCULAR | Status: AC
  Administered 2022-08-07: 4 mg via INTRAVENOUS
  Filled 2022-08-07: qty 2

## 2022-08-07 MED ORDER — PANTOPRAZOLE SODIUM 40 MG IV SOLR
40.0000 mg | Freq: Once | INTRAVENOUS | Status: AC
  Administered 2022-08-07: 40 mg via INTRAVENOUS
  Filled 2022-08-07: qty 10

## 2022-08-07 MED ORDER — KETOROLAC TROMETHAMINE 15 MG/ML IJ SOLN
15.0000 mg | Freq: Once | INTRAMUSCULAR | Status: AC
  Administered 2022-08-07: 15 mg via INTRAVENOUS
  Filled 2022-08-07: qty 1

## 2022-08-07 MED ORDER — ACETAMINOPHEN 325 MG PO TABS
650.0000 mg | ORAL_TABLET | Freq: Once | ORAL | Status: AC
  Administered 2022-08-07: 650 mg via ORAL
  Filled 2022-08-07: qty 2

## 2022-08-07 NOTE — ED Notes (Signed)
Pt resting comfortably. Respirations even and unlabored.  

## 2022-08-07 NOTE — ED Notes (Addendum)
Patient at nurses station, refusing x-rays until she gets pain medication.

## 2022-08-07 NOTE — ED Notes (Signed)
Patient apologized about behavior and is agreeable to x-rays.  X-ray reports that they are unable to do x-rays at this time due to having to do morning rounds but they will do them afterwards.

## 2022-08-07 NOTE — ED Notes (Signed)
Patient out of room, wandering around hall, stating she's looking for her doctor.  Patient informed that she can't wander the halls looking for staff, and encouraged to return to room to wait for MD.  Patient continued walking, staff following attempting to redirect at this time.

## 2022-08-07 NOTE — ED Notes (Signed)
Pt resting comfortably, respirations even and unlabored. Pt in NAD. Side rails up X2 for safety.

## 2022-08-07 NOTE — ED Notes (Signed)
MD notified pt hypotensive

## 2022-08-07 NOTE — ED Notes (Signed)
Patient Alert and oriented to baseline. Stable and ambulatory to baseline. Patient verbalized understanding of the discharge instructions.  Patient belongings were taken by the patient.   
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
## 2022-08-07 NOTE — ED Notes (Signed)
Patient ambulatory to and from restroom with a steady gait.
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
## 2022-08-07 NOTE — Discharge Instructions (Addendum)
You were seen in the emergency department for alcohol intoxication and injury to your right arm.  You had x-rays of your right shoulder elbow and wrist that did not show any fracture.  Please limit your alcohol intake and drink plenty of nonalcoholic fluids.  Follow-up with your regular doctor.  Return to the emergency department if any worsening or concerning symptoms.

## 2022-08-07 NOTE — ED Notes (Signed)
Pt given sandwich and ice water.

## 2022-08-07 NOTE — ED Notes (Signed)
Sling applied to patient's right arm for comfort.

## 2022-08-07 NOTE — ED Notes (Signed)
Dr. Melina Copa aware of trending BP.
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
## 2022-08-07 NOTE — ED Notes (Signed)
Pt called out asking for something for pain for her arm. MD notified.

## 2022-08-07 NOTE — ED Notes (Signed)
Pt ambulatory independently to the restroom with a steady gait.

## 2022-08-10 ENCOUNTER — Other Ambulatory Visit: Payer: Self-pay

## 2022-08-10 ENCOUNTER — Encounter: Payer: Self-pay | Admitting: Hematology and Oncology

## 2022-08-10 ENCOUNTER — Emergency Department (HOSPITAL_COMMUNITY)
Admission: EM | Admit: 2022-08-10 | Discharge: 2022-08-10 | Disposition: A | Discharge status: Home / Self Care | Payer: Medicaid Other | Attending: Emergency Medicine | Admitting: Emergency Medicine

## 2022-08-10 ENCOUNTER — Emergency Department (HOSPITAL_COMMUNITY): Payer: Medicaid Other

## 2022-08-10 DIAGNOSIS — Y908 Blood alcohol level of 240 mg/100 ml or more: Secondary | ICD-10-CM | POA: Insufficient documentation

## 2022-08-10 DIAGNOSIS — F1010 Alcohol abuse, uncomplicated: Secondary | ICD-10-CM

## 2022-08-10 DIAGNOSIS — F10129 Alcohol abuse with intoxication, unspecified: Secondary | ICD-10-CM | POA: Insufficient documentation

## 2022-08-10 DIAGNOSIS — R109 Unspecified abdominal pain: Secondary | ICD-10-CM | POA: Insufficient documentation

## 2022-08-10 LAB — CBC WITH DIFFERENTIAL/PLATELET
Abs Immature Granulocytes: 0.04 10*3/uL (ref 0.00–0.07)
Basophils Absolute: 0 10*3/uL (ref 0.0–0.1)
Basophils Relative: 1 %
Eosinophils Absolute: 0 10*3/uL (ref 0.0–0.5)
Eosinophils Relative: 1 %
HCT: 38.2 % (ref 36.0–46.0)
Hemoglobin: 12.5 g/dL (ref 12.0–15.0)
Immature Granulocytes: 1 %
Lymphocytes Relative: 43 %
Lymphs Abs: 1.9 10*3/uL (ref 0.7–4.0)
MCH: 28.7 pg (ref 26.0–34.0)
MCHC: 32.7 g/dL (ref 30.0–36.0)
MCV: 87.6 fL (ref 80.0–100.0)
Monocytes Absolute: 0.3 10*3/uL (ref 0.1–1.0)
Monocytes Relative: 8 %
Neutro Abs: 2.1 10*3/uL (ref 1.7–7.7)
Neutrophils Relative %: 46 %
Platelets: 73 10*3/uL — ABNORMAL LOW (ref 150–400)
RBC: 4.36 MIL/uL (ref 3.87–5.11)
RDW: 18.5 % — ABNORMAL HIGH (ref 11.5–15.5)
WBC: 4.4 10*3/uL (ref 4.0–10.5)
nRBC: 0 % (ref 0.0–0.2)

## 2022-08-10 LAB — ETHANOL: Alcohol, Ethyl (B): 328 mg/dL (ref <10)

## 2022-08-10 LAB — COMPREHENSIVE METABOLIC PANEL
ALT: 41 U/L (ref 0–44)
AST: 110 U/L — ABNORMAL HIGH (ref 15–41)
Albumin: 3.4 g/dL — ABNORMAL LOW (ref 3.5–5.0)
Alkaline Phosphatase: 92 U/L (ref 38–126)
Anion gap: 14 (ref 5–15)
BUN: <5 mg/dL — ABNORMAL LOW (ref 6–20)
CO2: 20 mmol/L — ABNORMAL LOW (ref 22–32)
Calcium: 8.8 mg/dL — ABNORMAL LOW (ref 8.9–10.3)
Chloride: 109 mmol/L (ref 98–111)
Creatinine, Ser: 0.54 mg/dL (ref 0.44–1.00)
GFR, Estimated: >60 mL/min (ref >60)
Glucose, Bld: 109 mg/dL — ABNORMAL HIGH (ref 70–99)
Potassium: 3.2 mmol/L — ABNORMAL LOW (ref 3.5–5.1)
Sodium: 143 mmol/L (ref 135–145)
Total Bilirubin: 1.8 mg/dL — ABNORMAL HIGH (ref 0.3–1.2)
Total Protein: 6.9 g/dL (ref 6.5–8.1)

## 2022-08-10 LAB — LIPASE, BLOOD: Lipase: 60 U/L — ABNORMAL HIGH (ref 11–51)

## 2022-08-10 MED ORDER — LORAZEPAM 2 MG/ML IJ SOLN
0.5000 mg | Freq: Once | INTRAMUSCULAR | Status: AC
  Administered 2022-08-10: 0.5 mg via INTRAVENOUS
  Filled 2022-08-10: qty 1

## 2022-08-10 MED ORDER — LACTATED RINGERS IV BOLUS
1000.0000 mL | Freq: Once | INTRAVENOUS | Status: AC
  Administered 2022-08-10: 1000 mL via INTRAVENOUS

## 2022-08-10 MED ORDER — CHLORDIAZEPOXIDE HCL 25 MG PO CAPS
ORAL_CAPSULE | ORAL | 0 refills | Status: AC
  Filled 2022-08-10: qty 10, 2d supply, fill #0

## 2022-08-10 MED ORDER — ONDANSETRON HCL 4 MG/2ML IJ SOLN
4.0000 mg | Freq: Once | INTRAMUSCULAR | Status: AC
  Administered 2022-08-10: 4 mg via INTRAVENOUS
  Filled 2022-08-10: qty 2

## 2022-08-10 MED ORDER — LACTATED RINGERS IV SOLN: INTRAVENOUS | Status: DC

## 2022-08-10 MED ORDER — POTASSIUM CHLORIDE CRYS ER 20 MEQ PO TBCR
40.0000 meq | EXTENDED_RELEASE_TABLET | Freq: Once | ORAL | Status: AC
  Administered 2022-08-10: 40 meq via ORAL
  Filled 2022-08-10: qty 2

## 2022-08-10 MED ORDER — SODIUM CHLORIDE 0.9 % IV BOLUS
1000.0000 mL | Freq: Once | INTRAVENOUS | Status: AC
  Administered 2022-08-10: 1000 mL via INTRAVENOUS

## 2022-08-10 NOTE — ED Notes (Signed)
Pt was 86% on 2L. RN put pt on 4L on O2.

## 2022-08-10 NOTE — ED Provider Triage Note (Signed)
Emergency Medicine Provider Triage Evaluation Note  Sara [name] , a 46 y.o. female  was evaluated in room.  Pt complains of alcohol abuse and shoulder pain. Seen for same three days ago. Was combative and argumentative with EMS on arrival but calm on my evaluation. Requesting something for nausea and 'wants to detox off alcohol'.   Review of Systems  Positive: nausea Negative: vomiting  Physical Exam  LMP 07/24/2022  Gen:   Awake, no distress   Resp:  Normal effort  MSK:   Moves extremities without difficulty  Other:  intoxicated  Medical Decision Making  Medically screening exam initiated at 6:48 AM.  Appropriate orders placed.  Jaya Lapka Son was informed that the remainder of the evaluation will be completed by another provider, this initial triage assessment does not replace that evaluation, and the importance of remaining in the ED until their evaluation is complete.     Truddie Hidden, MD 08/10/22 [phone]
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
## 2022-08-10 NOTE — ED Notes (Signed)
Pt SpO2 88% on RA, RN placed pt on 2L O2 via nasal cannula, spo2 now 95%

## 2022-08-10 NOTE — ED Notes (Signed)
Pt requesting meds for nausea DR.allen made aware.
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
## 2022-08-31 ENCOUNTER — Encounter: Payer: Self-pay | Admitting: Hematology and Oncology

## 2022-08-31 ENCOUNTER — Other Ambulatory Visit (HOSPITAL_COMMUNITY): Payer: Self-pay | Admitting: Physician Assistant

## 2022-08-31 ENCOUNTER — Other Ambulatory Visit: Payer: Self-pay

## 2022-08-31 DIAGNOSIS — F411 Generalized anxiety disorder: Secondary | ICD-10-CM

## 2022-08-31 DIAGNOSIS — F319 Bipolar disorder, unspecified: Secondary | ICD-10-CM

## 2022-09-07 ENCOUNTER — Other Ambulatory Visit: Payer: Self-pay

## 2022-09-08 ENCOUNTER — Other Ambulatory Visit: Payer: Self-pay
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
## 2022-09-25 NOTE — Patient Instructions (Signed)
Thank you for attending your appointment today.  -- INCREASE gabapentin to 300 mg three times daily -- Continue other medications as prescribed. -- Plan to start TROSA program as discussed.   Please do not make any changes to medications without first discussing with your provider. If you are experiencing a psychiatric emergency, please call 911 or present to your nearest emergency department. Additional crisis, medication management, and therapy resources are included below.  [facility]  [address], [place], [place] [address] [phone] WALK-IN URGENT CARE 24/7 FOR ANYONE [address] [place], [place]  [phone] Fax: [phone] [email or web address] *Interpreters available *Accepts all insurance and uninsured for Urgent Care needs *Accepts Medicaid and uninsured for outpatient treatment (below)      ONLY FOR [facility]  Below:    Outpatient New Patient Assessment/Therapy Walk-ins:        Monday -Thursday 8am until slots are full.        Every Friday 1pm-4pm  (first come, first served)                   New Patient Psychiatry/Medication Management        Monday-Friday 8am-11am (first come, first served)               For all walk-ins we ask that you arrive by 7:15am, because patients will be seen in the order of arrival.

## 2022-09-26 ENCOUNTER — Other Ambulatory Visit: Payer: Self-pay

## 2022-09-26 ENCOUNTER — Encounter (HOSPITAL_COMMUNITY): Payer: Self-pay | Admitting: Psychiatry

## 2022-09-26 ENCOUNTER — Telehealth (INDEPENDENT_AMBULATORY_CARE_PROVIDER_SITE_OTHER): Payer: No Payment, Other | Admitting: Psychiatry

## 2022-09-26 ENCOUNTER — Encounter: Payer: Self-pay | Admitting: Hematology and Oncology

## 2022-09-26 DIAGNOSIS — F1020 Alcohol dependence, uncomplicated: Secondary | ICD-10-CM | POA: Diagnosis not present

## 2022-09-26 DIAGNOSIS — F1024 Alcohol dependence with alcohol-induced mood disorder: Secondary | ICD-10-CM | POA: Diagnosis not present

## 2022-09-26 DIAGNOSIS — F419 Anxiety disorder, unspecified: Secondary | ICD-10-CM

## 2022-09-26 MED ORDER — HYDROXYZINE HCL 50 MG PO TABS
50.0000 mg | ORAL_TABLET | Freq: Three times a day (TID) | ORAL | 2 refills | Status: AC | PRN
  Filled 2022-09-26: qty 90, 30d supply, fill #0

## 2022-09-26 MED ORDER — ESCITALOPRAM OXALATE 20 MG PO TABS
20.0000 mg | ORAL_TABLET | Freq: Every day | ORAL | 2 refills | Status: DC
  Filled 2022-09-26: qty 30, 30d supply, fill #0

## 2022-09-26 MED ORDER — QUETIAPINE FUMARATE 100 MG PO TABS
100.0000 mg | ORAL_TABLET | Freq: Every evening | ORAL | 2 refills | Status: DC
  Filled 2022-09-26: qty 30, 30d supply, fill #0

## 2022-09-26 MED ORDER — GABAPENTIN 300 MG PO CAPS
300.0000 mg | ORAL_CAPSULE | Freq: Three times a day (TID) | ORAL | 2 refills | Status: DC
  Filled 2022-09-26: qty 90, 30d supply, fill #0

## 2022-09-26 MED ORDER — TOPIRAMATE 50 MG PO TABS
50.0000 mg | ORAL_TABLET | Freq: Two times a day (BID) | ORAL | 2 refills | Status: DC
  Filled 2022-09-26: qty 60, 30d supply, fill #0

## 2022-09-29 ENCOUNTER — Other Ambulatory Visit: Payer: Self-pay

## 2022-10-07 ENCOUNTER — Other Ambulatory Visit: Payer: Self-pay

## 2022-11-03 ENCOUNTER — Other Ambulatory Visit: Payer: Self-pay

## 2022-11-15 NOTE — Progress Notes (Unsigned)
Patient did not connect for virtual psychiatric medication management appointment on 11/16/22 at 1:30PM. Sent secure video link with no response. Called phone and reached patient's husband who states she is currently admitted to 1-year residential facility for alcohol use disorder and program is managing current psychiatric needs. Shared that once patient is released, she can reach back out to the clinic to re-establish care if desired.  Per chart review - patient with recent admission to [facility] 10/06/22-10/10/22 for hypoxia (cardiac/pulmonary workup unrevealing) and requesting detox from alcohol. Noted to have prolonged Qtc to 522 despite electrolyte replacement; seen by psychiatry and resumed on lower dose of Lexapro 10 mg daily, gabapentin 200 mg TID, Atarax 50 mg TID PRN; Seroquel and topamax were discontinued. Discharged home to daughter with plans to be taken to [facility].   If patient re-establishes care, would avoid Qtc-prolonging agents given history of numerous EKGs demonstrating prolonged Qtc despite medical stabilization.   Alda Berthold, MD 11/16/22

## 2022-11-16 ENCOUNTER — Encounter (HOSPITAL_COMMUNITY): Payer: No Payment, Other | Admitting: Psychiatry

## 2022-11-16 ENCOUNTER — Encounter (HOSPITAL_COMMUNITY): Payer: Self-pay

## 2022-12-07 ENCOUNTER — Other Ambulatory Visit: Payer: Self-pay

## 2022-12-28 ENCOUNTER — Other Ambulatory Visit (HOSPITAL_BASED_OUTPATIENT_CLINIC_OR_DEPARTMENT_OTHER): Payer: Self-pay

## 2022-12-28 ENCOUNTER — Encounter: Payer: Self-pay | Admitting: Hematology and Oncology

## 2022-12-28 MED ORDER — ACAMPROSATE CALCIUM 333 MG PO TBEC
666.0000 mg | DELAYED_RELEASE_TABLET | Freq: Three times a day (TID) | ORAL | 0 refills | Status: DC
  Filled 2022-12-28: qty 540, 90d supply, fill #0

## 2023-02-01 DIAGNOSIS — F1093 Alcohol withdrawal syndrome without complication (HCC/RAF): Secondary | ICD-10-CM

## 2023-02-02 ENCOUNTER — Ambulatory Visit: Payer: MEDICAID

## 2023-02-02 ENCOUNTER — Inpatient Hospital Stay
Admit: 2023-02-02 | Discharge: 2023-02-02 | Disposition: A | Discharge status: Home / Self Care | Payer: MEDICAID | Source: Home / Self Care | Attending: Emergency Medical Services

## 2023-02-02 ENCOUNTER — Inpatient Hospital Stay
Admit: 2023-02-02 | Discharge: 2023-02-02 | Disposition: A | Discharge status: Home / Self Care | Payer: MEDICAID | Source: Home / Self Care

## 2023-02-02 DIAGNOSIS — S9781XA Crushing injury of right foot, initial encounter: Secondary | ICD-10-CM

## 2023-02-02 LAB — Glucose, Whole Blood: GLUCOSE, WHOLE BLOOD: 121 mg/dL — ABNORMAL HIGH (ref 65–99)

## 2023-02-02 LAB — CREATININE: ESTIMATED GFR 2021 CKD-EPI: >89 mL/min/{1.73_m2} (ref 0.60–1.30)

## 2023-02-02 LAB — Electrolyte Panel
ANION GAP: 12 mmol/L (ref 8–19)
CHLORIDE: 115 mmol/L — ABNORMAL HIGH (ref 96–106)

## 2023-02-02 LAB — Prothrombin Time Panel: INR: 1.2 s (ref 11.5–14.4)

## 2023-02-02 LAB — Urea Nitrogen: UREA NITROGEN: 7 mg/dL (ref 7–22)

## 2023-02-02 LAB — Alcohol,Ethyl: ALCOHOL,ETHYL: 282 mg/dL (ref <15)

## 2023-02-02 LAB — CBC: MEAN PLATELET VOLUME: 10.1 fL (ref 9.3–13.0)

## 2023-02-02 LAB — Hepatic Funct Panel: ALBUMIN FORHEPFUNCTPNL: 3.3 g/dL — ABNORMAL LOW (ref 3.9–5.0)

## 2023-02-02 LAB — Lipase: LIPASE: 112 U/L — ABNORMAL HIGH (ref 13–69)

## 2023-02-02 MED ADMIN — ONDANSETRON 4 MG PO TBDP: 4 mg | ORAL | @ 03:00:00 | Stop: 2023-02-02 | NDC 57237007710

## 2023-02-02 MED ADMIN — IBUPROFEN 600 MG PO TABS: 600 mg | ORAL | @ 23:00:00 | Stop: 2023-02-02 | NDC 60687045711

## 2023-02-02 MED ADMIN — POTASSIUM CHLORIDE 20 MEQ/15ML (10%) PO SOLN: 20 meq | ORAL | @ 05:00:00 | Stop: 2023-02-02 | NDC 60687062844

## 2023-02-02 MED ADMIN — ACETAMINOPHEN 325 MG PO TABS: 650 mg | ORAL | @ 03:00:00 | Stop: 2023-02-02 | NDC 71399801401

## 2023-02-02 MED ADMIN — LACTATED RINGERS IV BOLUS: 1000 mL | INTRAVENOUS | @ 04:00:00 | Stop: 2023-02-02 | NDC 00338011704

## 2023-02-02 MED ADMIN — PHENOBARBITAL SODIUM 130 MG/ML IJ SOLN: 130 mg | INTRAVENOUS | @ 04:00:00 | Stop: 2023-02-02 | NDC 42494041601

## 2023-02-02 NOTE — ED Provider Notes
[facility]       Chief Complaint:  Alcohol Intoxication (Pt c/o of alcohol withdrawal, last alcohol intake this AM. CIWA of 8. Pt endorsing headache and back pain 8/10 pain. +N, -V, -CP, -SOB. )    BP 115/78  ~ Pulse 85  ~ Temp 36.9 ?C (98.4 ?F)  ~ Resp 18  ~ Ht 1.6 m (5' 3'')  ~ Wt 68 kg (150 lb)  ~ LMP 02/01/2023 (Exact Date)  ~ SpO2 98%  ~ BMI 26.57 kg/m?     Comprehensive Exam       Date/Time Event User Comments    02/01/23 2039 ED Physician Initial Contact HAMILTON, Kandace Blitz --            History   HPI  Lindsey Ball is a 47 y.o. female with past medical history of EtOH use disorder presenting to the ED today with alcohol withdrawal. Patient reports she has been struggling with EtOH use for many years, and had a binge over the last 5 days. She states she has not had anything to eat over the past five days and has consumed one bottle of liquor per day. Last drink was very early this morning/last night. She feels that she is in withdrawal currently. She feels shaky and anxious.     Denies: HA, vision changes, cough, nausea, emesis, chest pain, sob, abdominal pain, dysuria, BRBPR, melena  Denies: drug use  Denies: recent travel/sick contacts    History reviewed. No pertinent past medical history.     History reviewed. No pertinent surgical history.     Past Family History   family history is not on file.     Past Social History   she reports that she has never smoked. She has never used smokeless tobacco. She reports current alcohol use. She reports that she does not use drugs. No history on file for sexual activity.       Physical Exam     Vital signs reviewed and noted.   GEN: non-toxic appearing  HEENT: Atraumatic, EOMI, pupils round and reactive, moist mucus membranes  NECK: Supple, no LAD,  No JVD.  LUNGS: CTA Bilaterally   HEART:  RRR, 2+ pulses radial and DP  ABD: Soft, ND, NT, +BS  BACK: No midline TTP, no CVA tenderness  EXT: No pedal edema  SKIN: Warm, dry, no rashes  NEURO:  Mentation appropriate, CN II-XII intact. 5/5 strength B/L U&LE.  Sensation globally intact and symmetric. Gait normal.    Initial Assessment and Plan   Lindsey Ball is a 47 y.o. female with past medical history of EtOH use disorder presenting to the ED today with alcohol withdrawal vs intoxication. glucose test has shown no hypoglycemia, the patient has no evidence of a fall or head trauma suggestive of intracranial hemorrhage, and cardiac monitoring shows a perfusing rhythm producing an adequate blood pressure. There is low suspicion, given history and physical examination findings detailed above, of hepatic encephalopathy, uremia, Wernicke's encephalopathy, stroke, seizure, carbon dioxide retention due to hypoventilation, thyroid dysfunction.  The patient's presentation, based on history and examination factors detailed above, is most consistent with acute alcohol intoxication w/ mixed early withdrawal.     Chart Review   Previous medical records requested.  Pertinent items reviewed: med hx     ED Course     Laboratory Results     Labs Reviewed   CBC - Abnormal; Notable for the following components:       Result  Value    White Blood Cell Count 3.34 (*)     Red Blood Cell Count 3.57 (*)     Hemoglobin 11.0 (*)     Hematocrit 32.9 (*)     Red Cell Distribution Width-SD 53.1 (*)     Red Cell Distribution Width-CV 15.8 (*)     Platelet Count, Auto 100 (*)     All other components within normal limits   ELECTROLYTE PANEL - Abnormal; Notable for the following components:    Sodium 147 (*)     Potassium 3.4 (*)     Chloride 115 (*)     All other components within normal limits   GLUCOSE - Abnormal; Notable for the following components:    Glucose 121 (*)     All other components within normal limits   HEPATIC FUNCT PANEL - Abnormal; Notable for the following components:    Albumin 3.3 (*)     Alkaline Phosphatase 126 (*)     All other components within normal limits   PROTHROMBIN TIME PANEL - Abnormal; Notable for the following components:    Prothrombin Time 15.5 (*)     All other components within normal limits   LIPASE - Abnormal; Notable for the following components:    Lipase 112 (*)     All other components within normal limits   UREA NITROGEN - Normal   CREATININE,WHOLE BLOOD   ALCOHOL,ETHYL       Imaging Results     No orders to display       Progress Notes / Reassessments     ED Course as of 02/03/23 0118   Wed Feb 01, 2023   2139 47 yr old female w/ hx alcohol use disorder who p/w alcohol intoxication vs withdrawal; last drink was this morning; feels withdrawal symptoms [mild]. ETOH came back at 24; HDS [MN]   2216 Pending ambulation  [MN]      ED Course User Index  [MN] Sydnee Cabal., MD       ED Procedure Notes     Any ED procedures performed are documented on separate ED procedure notes.    Clinical Impression     1. Alcohol withdrawal syndrome without complication (HCC/RAF)        Disposition and Follow-up   Disposition: Discharge [1]     No future appointments.    Follow up with:  No follow-up provider specified.    Return precautions are specified on After Visit Summary.    Discharge Medication List as of 02/01/2023 10:22 PM        Scribe Signature   I, Earlean Polka , have acted as a Stage manager for patient Lindsey Ball on behalf of Dr. Baldemar Friday at 02/01/2023 at 8:40 PM. All documentation underwent a comprehensive review by the listed physician(s) and received their approval upon signing.      Resident Signature                 Sydnee Cabal., MD  Resident  02/03/23 [phone]      Attestation     ATTENDING NOTE    I was present with the resident during the key/critical portions of this service. I have discussed the management with the resident, have reviewed the resident note and agree with the documented findings and plan of care.           Heloise Ochoa., MD  02/04/23 [phone]

## 2023-02-02 NOTE — Discharge Instructions
Emergency Department Discharge Instructions      Summary of your visit  You have been evaluated in the [facility] Department today for possible early alcohol withdrawal    Follow-Up  Please follow up with your primary care physician within three days after being discharged from the ER - you can call to schedule an appointment.  You can find a primary care physician at [facility] by calling [phone].    If you do not have a Primary Care Physician, please call your insurance company or you may call [phone] to establish care with a [facility] physician.  If you are uninsured, please call 2-1-1 to find a free or low-cost clinic in your area. 2-1-1 [place] is the central source for providing information and referrals for all health and human services in [place]. Our 2-1-1 phone line is open 24 hours, 7 days a week, with trained Community Resource Advisors prepared to offer help with any situation, any time. Our community services go far beyond phone referrals - explore our website to learn more. If you are calling from outside [place] or cannot directly dial 2-1-1, you can call [phone].      Return to the Emergency Department if you experience:  Rapid heart rate/palpitations  Seizures  Sudden weakness on one side of the body or sudden trouble speaking  If you can't keep down fluids or food  Heavy bleeding or vomiting blood  Trouble breathing or slow, irregular breathing  You faint, become very drowsy, or have trouble awakening  Chest pain  Any other concerning symptoms     Thank you for choosing [facility] for your care. It was a pleasure taking part in your care today, and we wish you the best!

## 2023-02-02 NOTE — ED Provider Notes
[facility]  Emergency Department Service Report    Lindsey Ball 47 y.o. female , presents with Foot Pain      Triage   Arrived on 02/02/2023 at 2:34 PM   Arrived by BLS [13] Kathlene November 224)    ED Triage Vitals [02/02/23 1445]   Temp Temp Source BP Heart Rate Resp SpO2 O2 Device Pain Score Weight   36.9 ?C (98.4 ?F) Oral 126/69 (!) 114 16 99 % None (Room air) Eight 68 kg (150 lb)       Pre hospital care:       No Known Allergies    History   HPI   Lindsey Ball is a 47 y.o. female with no pertinent past medical history who was brought in by ambulance and present to the ED for evaluation of right foot pain after getting ran over by a police car. Patient reports that she recently arrived to [facility] from [place] in which she was asking police officers for directions earlier today when they ran over her right foot. She now endorses swelling and pain to her right foot and toes.          History reviewed. No pertinent past medical history.     History reviewed. No pertinent surgical history.     Past Family History   Family history reviewed by me and there is no pertinent past family history related to the patient's current case and/or care.             Past Social History   she reports that she has never smoked. She has never used smokeless tobacco. She reports current alcohol use. She reports that she does not use drugs. No history on file for sexual activity.       Physical Exam   Physical Exam  Vitals and nursing note reviewed.   Constitutional:       General: She is not in acute distress.     Appearance: She is well-developed.   HENT:      Head: Normocephalic and atraumatic.   Eyes:      Conjunctiva/sclera: Conjunctivae normal.   Pulmonary:      Effort: Pulmonary effort is normal.   Musculoskeletal:         General: Normal range of motion.      Cervical back: Neck supple.   Feet:      Comments: (+) Tender right foot  Skin:     General: Skin is warm and dry. Neurological:      Mental Status: She is alert.   Psychiatric:         Behavior: Behavior normal.         ED Course          Laboratory Results   Labs Reviewed - No data to display    Imaging Results     XR foot ap+lat+obl right (3 views)   Final Result by Estill Cotta., MD (03/14 1541)   IMPRESSION:      No acute fracture or dislocation. Plantar calcaneal spur.            Signed by: Ilda Mori   02/02/2023 3:41 PM          Administered Medications     Medication Administration from 02/02/2023 1435 to 02/02/2023 1615         Date/Time Order Dose Route Action Action by Comments     02/02/2023 1614 PDT ibuprofen tab 600 mg 600  mg Oral Given Kenyon Ana., RN --            Procedures   Procedural Sedation  Procedures    Medical Decision Making   ED Course:  Nursing note reviewed.  Previous medical records were obtained and reviewed by myself.     I visited the patient to obtain history and perform the physical exam. Orders placed for XR right foot.  Patient will be administered ibuprofen.      Radiology Results (as interpreted by me):   Per my interpretation of the right foot x-ray, no acute fracture or dislocation.     Medical Decision Making:  Lindsey Ball is a 47 y.o. female with no pertinent past medical history who was brought in by ambulance and present to the ED for evaluation of right foot pain after getting ran over by a police car.  On exam, the patient exhibits tenderness to her right foot.       Given the patient's physical exam and imaging studies, my impression is the patient is experiencing an episode of crushing injury of foot unspecified laterality.  Therefore, I feel comfortable discharging the patient home with close outpatient follow up.    Discharge Instructions:  I advised the patient to follow up with their primary care physician on an outpatient basis over the next 24-48 hours, and recommended prompt  return to the emergency department if they have additional concerns or note worsening symptoms.  The patient indicates understanding of these issues and agrees with the plan.  A copy of the ED  workup results was provided to the patient as well.     Medical Decision Making  Amount and/or Complexity of Data Reviewed  Radiology: ordered.      Clinical Impression     1. Crushing injury of right foot, initial encounter          Prescriptions     New Prescriptions    No medications on file       Disposition and Follow-up   Disposition: Discharge [1]    No future appointments.    Follow up with:  No follow-up provider specified.    Return precautions are specified on After Visit Summary.          Scribe Signature   We, Shella Maxim, Sarajane Marek have acted as Company secretary for patient Lindsey Ball on behalf of Dr. Dellie Catholic at 02/02/2023 at 3:59 PM. All documentation underwent a comprehensive review by the listed physician(s) and received their approval upon signing.       Physician Signature(s)

## 2023-02-02 NOTE — ED Notes
Sheriff speaking to the patient in the lobby

## 2023-02-02 NOTE — ED Notes
[employer]?NOTIFICATION?02/02/2023 14:34?URQUIJOArlo, [name]?MRN: [id number]    [facility]'s patient encounter information:   GMW:?[id number]  Account [id number]  Billing Account [id number]      Criteria Met      2 Visits in 30 Days    Security and Safety  No Security Events were found.  ED Care Guidelines  There are currently no ED Care Guidelines for this patient. Please check your facility's medical records system.          Prescription Drug Data  No Prescription Drug Data was found.    E.D. Visit Count (12 mo.)  Facility Visits   [name] 1   [name] 1   Total 2   Note: Visits indicate total known visits.     Recent Emergency Department Visit Summary  Date Facility [facility] Type Diagnoses or Chief Complaint    Feb 02, 2023  [facility].  [place]  Emergency     Feb 01, 2023  [name]  [place]  Emergency      1. Alcohol use, unspecified with withdrawal, uncomplicated      1. Alcohol Intoxication        Recent Inpatient Visit Summary  No Recent Inpatient Visits were found.  Care Team  No Care Team was found.  [facility]  This patient has registered at the [facility] Emergency Department  For more information visit: [email or web address]   PLEASE NOTE:     1.   Any care recommendations and other clinical information are provided as guidelines or for historical purposes only, and providers should exercise their own clinical judgment when providing care.    2.   You may only use this information for purposes of treatment, payment or health care operations activities, and subject to the limitations of applicable [employer] Policies.    3.   You should consult directly with the organization that provided a care guideline or other clinical history with any questions about additional information or accuracy or completeness of information provided.    ? 2024 [employer] - www.[facility].com

## 2023-08-16 ENCOUNTER — Encounter: Payer: Self-pay | Admitting: Hematology and Oncology

## 2023-10-31 ENCOUNTER — Other Ambulatory Visit: Payer: Self-pay

## 2024-11-10 DIAGNOSIS — K7210 Chronic hepatic failure without coma: Secondary | ICD-10-CM | POA: Insufficient documentation

## 2024-11-10 DIAGNOSIS — Z9981 Dependence on supplemental oxygen: Secondary | ICD-10-CM | POA: Insufficient documentation

## 2024-11-11 ENCOUNTER — Other Ambulatory Visit: Payer: Self-pay

## 2024-11-11 ENCOUNTER — Emergency Department (HOSPITAL_COMMUNITY): Payer: Self-pay

## 2024-11-11 ENCOUNTER — Emergency Department (HOSPITAL_COMMUNITY)
Admission: EM | Admit: 2024-11-11 | Discharge: 2024-11-11 | Disposition: A | Discharge status: Home / Self Care | Payer: Self-pay | Attending: Emergency Medicine | Admitting: Emergency Medicine

## 2024-11-11 ENCOUNTER — Encounter (HOSPITAL_COMMUNITY): Payer: Self-pay

## 2024-11-11 DIAGNOSIS — K7210 Chronic hepatic failure without coma: Secondary | ICD-10-CM | POA: Diagnosis not present

## 2024-11-11 DIAGNOSIS — Z9981 Dependence on supplemental oxygen: Secondary | ICD-10-CM

## 2024-11-11 DIAGNOSIS — Z515 Encounter for palliative care: Secondary | ICD-10-CM

## 2024-11-11 DIAGNOSIS — R0602 Shortness of breath: Secondary | ICD-10-CM | POA: Diagnosis not present

## 2024-11-11 LAB — COMPREHENSIVE METABOLIC PANEL WITH GFR
ALT: 16 U/L (ref 0–44)
AST: 45 U/L — ABNORMAL HIGH (ref 15–41)
Albumin: 2.9 g/dL — ABNORMAL LOW (ref 3.5–5.0)
Alkaline Phosphatase: 143 U/L — ABNORMAL HIGH (ref 38–126)
Anion gap: 8 (ref 5–15)
BUN: 9 mg/dL (ref 6–20)
CO2: 26 mmol/L (ref 22–32)
Calcium: 9 mg/dL (ref 8.9–10.3)
Chloride: 106 mmol/L (ref 98–111)
Creatinine, Ser: 0.41 mg/dL — ABNORMAL LOW (ref 0.44–1.00)
GFR, Estimated: >60 mL/min
Glucose, Bld: 78 mg/dL (ref 70–99)
Potassium: 3.7 mmol/L (ref 3.5–5.1)
Sodium: 140 mmol/L (ref 135–145)
Total Bilirubin: 9.7 mg/dL — ABNORMAL HIGH (ref 0.0–1.2)
Total Protein: 6.2 g/dL — ABNORMAL LOW (ref 6.5–8.1)

## 2024-11-11 LAB — CBC WITH DIFFERENTIAL/PLATELET
Abs Immature Granulocytes: 0.01 K/uL (ref 0.00–0.07)
Basophils Absolute: 0 K/uL (ref 0.0–0.1)
Basophils Relative: 1 %
Eosinophils Absolute: 0 K/uL (ref 0.0–0.5)
Eosinophils Relative: 1 %
HCT: 24.7 % — ABNORMAL LOW (ref 36.0–46.0)
Hemoglobin: 8.1 g/dL — ABNORMAL LOW (ref 12.0–15.0)
Immature Granulocytes: 1 %
Lymphocytes Relative: 44 %
Lymphs Abs: 0.8 K/uL (ref 0.7–4.0)
MCH: 34 pg (ref 26.0–34.0)
MCHC: 32.8 g/dL (ref 30.0–36.0)
MCV: 103.8 fL — ABNORMAL HIGH (ref 80.0–100.0)
Monocytes Absolute: 0.2 K/uL (ref 0.1–1.0)
Monocytes Relative: 11 %
Neutro Abs: 0.8 K/uL — ABNORMAL LOW (ref 1.7–7.7)
Neutrophils Relative %: 42 %
Platelets: 33 K/uL — ABNORMAL LOW (ref 150–400)
RBC: 2.38 MIL/uL — ABNORMAL LOW (ref 3.87–5.11)
RDW: 21.9 % — ABNORMAL HIGH (ref 11.5–15.5)
Smear Review: NORMAL
WBC: 1.9 K/uL — ABNORMAL LOW (ref 4.0–10.5)
nRBC: 0 % (ref 0.0–0.2)

## 2024-11-11 LAB — PROTIME-INR
INR: 2.8 — ABNORMAL HIGH (ref 0.8–1.2)
Prothrombin Time: 31.1 s — ABNORMAL HIGH (ref 11.4–15.2)

## 2024-11-11 NOTE — ED Notes (Signed)
 Pt requested a low sodium diet. MD stated pt could eat via secure messaging

## 2024-11-11 NOTE — ED Provider Notes (Signed)
 I received the patient in signout.  This is a 48 year old with end-stage liver disease who presented today after being discharged from a hospital in [place]  where she was treated for pneumonia.  The patient relocated here to [place]  to be closer to family.  She is requesting palliative care services.  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  Is not requiring any oxygen here.  Labs are otherwise largely unremarkable.  A call was placed to hospitalist service as well as palliative care services to try to help facilitate arrangement for palliative care as an outpatient hopefully. Physical Exam  BP (!) 90/54   Pulse 76   Temp (!) 97.5 F (36.4 C)   Resp 18   SpO2 97%   Physical Exam  Procedures  Procedures  ED Course / MDM    Medical Decision Making Amount and/or Complexity of Data Reviewed Labs: ordered. Radiology: ordered.   We are still trying to arrange outpatient hospice services for the patient at time signout.       Ula Prentice SAUNDERS, MD 11/11/24 [phone]

## 2024-11-11 NOTE — Progress Notes (Signed)
 Transition of Care [facility]) - Inpatient Brief Assessment   Patient Details  Name: Sara [name] MRN: [id number] Date of Birth: 08-16-1976  [facility] CM/SW Contact:    Debarah Saunas, RN Phone Number: 11/11/2024, 11:06 AM   Clinical Narrative: [facility] consulted regarding pt requiring home oxygen and possible palliative vs hospice care.  Pt utilizes portable tank that she received after being hospitalized in [place] .  RNCM suggest placing Palliative Consult and obtaining ambulatory pulmonary walking sats to qualify for home oxygen.  ICM will continue to follow for disposition needs.    Transition of Care Asessment: Insurance and Status: (P) Insurance coverage has been reviewed Patient has primary care physician: (P) No Home environment has been reviewed: (P) home with family Prior level of function:: (P) independent, utilizes home oxygen tank Prior/Current Home Services: (P) No current home services Social Drivers of Health Review: (P) SDOH reviewed no interventions necessary Readmission risk has been reviewed: (P) Yes Transition of care needs: (P) transition of care needs identified, TOC will continue to follow

## 2024-11-11 NOTE — TOC CM/SW Note (Signed)
 Per [employer], patient's daughter reached out to [facility] for possible services. Hospice agency has reviewed and will accept patient for services.   Patient will require home O2 in place prior to ED d/c. Hospice rep working currently to get DME in place. Patient's daughter has a portable O2 concentrator and will transport patient home at discharge.   No further CM needs reported at this time. Plan for patient to d/c with hospice services at home.  Merilee Batty, MSN, RN Case Management [phone]

## 2024-11-11 NOTE — Progress Notes (Addendum)
 [facility] Liaison Note  Patient's daughter, Wilder, called [facility] office to inquire about hospice referral/services for her mother. Liaison spoke with daughter to explain services and answered questions. Daughter has a previous relationship with [facility]   ED staff and ICM notified via secure chat of daughter's interest. Liaisons will follow and await further direction.   Thank you Eleanor Nail, LPN [facility] Liaison [phone]
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
## 2024-11-11 NOTE — Consult Note (Addendum)
 "                                                  Palliative Care Consult Note                                  Date: 11/11/2024   Patient Name: Sara [name]  DOB: 15-Feb-1976  MRN: [id number]  Age / Sex: 48 y.o., female  PCP: Pcp, No Referring Physician: Rogelia Jerilynn RAMAN, MD  Reason for Consultation: Establishing goals of care  HPI/Patient Profile: 48 y.o. female  with past medical history of alcoholic cirrhosis, esophageal varices, portal hypertension admitted on 11/11/2024 with need for home oxygen. Patient's oxygen requirements have increased and she only has portable oxygen.   Past Medical History:  Diagnosis Date   Alcohol  abuse    Alcoholism (HCC)    Anemia    Anxiety    Blood transfusion without reported diagnosis    Cirrhosis (HCC)    Depression    Esophageal varices with bleeding(456.0) 06/13/2014   GERD (gastroesophageal reflux disease)    Heart murmur    Patient states she may have   Menorrhagia    Pancytopenia (HCC) 01/15/2014   Pneumonia    Portal hypertension (HCC)    S/P alcohol  detoxification    2-3 days at behavioral health previously   UGI bleed 06/12/2014    Subjective:   I have reviewed medical records including EPIC notes, labs and imaging, assessed the patient and then met with the patient and her daughter (by phone) to discuss diagnosis prognosis, GOC, EOL wishes, disposition and options.  I introduced Palliative Medicine as specialized medical care for people living with serious illness. It focuses on providing relief from symptoms and stress of a serious illness. The goal is to improve quality of life for both the patient and the family.  Today's Discussion: Received update from attending provider and [facility] liaison. Patient and daughter requested possible palliative or hospice services. After meeting with hospice services patient and family decided to discharge home with hospice services through [facility] once oxygen is delivered to the  home.  Met with patient at bedside and daughter Sara [name] (by phone). We discussed the patient's end stage liver disease and prognosis. Patient has had several hospitalizations and complications since 2023. She was recently hospitalized in [place]  for pneumonia. After this hospitalization she decided to move closer to her daughter Sara [name] who is a engineer, civil (consulting). We discussed hospice philosophy. Sara [name] shared the DME will be delivered this afternoon. She plans to pick up the patient afterwards.   Questions and concerns were addressed. The family was encouraged to call with questions or concerns. PMT will continue to support holistically.  Review of Systems  Respiratory:  Positive for shortness of breath.   All other systems reviewed and are negative.   Objective:   Primary Diagnoses: Present on Admission: **None**   Physical Exam Vitals reviewed.  Constitutional:      General: She is not in acute distress.    Interventions: Nasal cannula in place.  HENT:     Head: Normocephalic and atraumatic.  Cardiovascular:     Rate and Rhythm: Normal rate.  Pulmonary:     Effort: Pulmonary effort is normal.  Skin:    General: Skin is warm and  dry.     Coloration: Skin is jaundiced.  Neurological:     Mental Status: She is alert.  Psychiatric:        Behavior: Behavior normal.     Vital Signs:  BP (!) 94/56   Pulse 76   Temp 98.6 F (37 C) (Oral)   Resp 18   SpO2 98%     Assessment & Plan:   SUMMARY OF RECOMMENDATIONS   Patient to discharge home with hospice  DME to be delivered today PMT will follow as needed   Discussed with: bedside RN, attending provider, and [facility] liaison  Time Total: 40 minutes    Thank you for allowing us  to participate in the care of Sara [name] PMT will continue to support holistically.   Signed by: Stephane Palin, NP Palliative Medicine Team  Team Phone # [phone] (Nights/Weekends)  11/11/2024, 4:44 PM   "

## 2024-11-11 NOTE — ED Triage Notes (Signed)
 Pt arrived via POV from home for at home oxygen RX and referral for Liver possible Palliative care/hospice  Pt recently relocated for [place] to [facility]. Does not have at home oxygen pt only has portable. Normally on 4L [place] portable, currently on 3L.

## 2024-11-11 NOTE — Discharge Instructions (Addendum)
 Sara [name]  Thank you for allowing us  to take care of you today.  You came to the Emergency Department today because you recently moved to the area to continue management of your cancer closer to family.  Today we had the palliative team come and speak with you, and we arranged for you to have continuous oxygen at home.    To-Do: 1. Please follow-up with your primary doctor within 1 - 2 weeks / as soon as possible.    Please return to the Emergency Department or call 911 if you experience have worsening of your symptoms, chest pain, new and different shortness of breath, severe or significantly worsening pain, high fever, severe confusion, pass out or have any reason to think that you need emergency medical care.   We hope you feel better soon.   Mitzie Later, MD Department of Emergency Medicine [facility] [place]

## 2024-11-11 NOTE — ED Provider Notes (Signed)
" °  [place] EMERGENCY DEPARTMENT AT [facility] Provider Assume Care Note I assumed care of Sara [name] on 11/11/2024 at 3 PM from Dr. Ula.   Briefly, Sara [name] is a 48 y.o. female who: PMHx: End-stage liver disease P/w need to arrange for palliative care services, recently was diagnosed with pneumonia in [place] , moved here to be closer to family, also needs home oxygen  Plan at the time of handoff: Awaiting delivery of home oxygen as well as palliative care consult, anticipate discharge thereafter   Please refer to the original providers note for additional information regarding the care of Sara [name].  Reassessment: I personally reassessed the patient: Patient without any acute complaints or additional questions.  Vital Signs:  ED Triage Vitals  Encounter Vitals Group     BP 11/11/24 0008 105/60     Girls Systolic BP Percentile --      Girls Diastolic BP Percentile --      Boys Systolic BP Percentile --      Boys Diastolic BP Percentile --      Pulse Rate 11/11/24 0008 65     Resp 11/11/24 0008 15     Temp 11/11/24 0008 98.1 F (36.7 C)     Temp Source 11/11/24 0008 Oral     SpO2 11/11/24 0008 90 %     Weight --      Height --      Head Circumference --      Peak Flow --      Pain Score 11/11/24 0019 0     Pain Loc --      Pain Education --      Exclude from Growth Chart --      Hemodynamics:  The patient is hemodynamically stable. Mental Status:  The patient is alert  Additional MDM: Palliative care evaluated the patient, will help arrange outpatient follow-up.  Oxygen concentrator and home oxygen was arranged, patient's daughter presented to bedside, patient and daughter discharged home.  Daughter and patient felt comfortable with this plan.  Disposition: DISCHARGE: I believe that the patient is safe for discharge home with outpatient follow-up. Patient was informed of all pertinent physical exam, laboratory, and  imaging findings. Patient's suspected etiology of their symptom presentation was discussed with the patient and all questions were answered. We discussed following up with PCP, palliative care services. I provided thorough ED return precautions. The patient feels safe and comfortable with this plan.   FREDRIK CANDIE Later, MD Emergency Medicine    Later Jerilynn RAMAN, MD 11/11/24 [phone]  "

## 2024-11-11 NOTE — ED Notes (Signed)
 SATURATION QUALIFICATIONS: (This note is used to comply with regulatory documentation for home oxygen)  Patient Saturations on Room Air at Rest = 94%  Patient Saturations on Room Air while Ambulating = 85%  Patient Saturations on 4 Liters of oxygen while Ambulating = 96%  Please briefly explain why patient needs home oxygen: When transitioning form room air to O2 while ambulating, it took the pt at least 2 minutes and deep breaths to bring up her sats from 85 to normal limits (93%)

## 2024-11-12 LAB — PATHOLOGIST SMEAR REVIEW

## 2024-11-18 ENCOUNTER — Other Ambulatory Visit: Payer: Self-pay

## 2024-11-18 ENCOUNTER — Encounter: Payer: Self-pay | Admitting: Hematology and Oncology

## 2024-11-18 MED ORDER — LORAZEPAM 1 MG PO TABS
1.0000 mg | ORAL_TABLET | ORAL | 2 refills | Status: AC | PRN
  Filled 2024-11-18: qty 90, 15d supply, fill #0

## 2024-11-19 ENCOUNTER — Emergency Department (HOSPITAL_COMMUNITY)

## 2024-11-19 ENCOUNTER — Encounter: Payer: Self-pay | Admitting: Hematology and Oncology

## 2024-11-19 ENCOUNTER — Emergency Department (HOSPITAL_COMMUNITY)
Admission: EM | Admit: 2024-11-19 | Discharge: 2024-11-19 | Disposition: A | Discharge status: Hospice | Source: Skilled Nursing Facility | Attending: Emergency Medicine | Admitting: Emergency Medicine

## 2024-11-19 DIAGNOSIS — S80812A Abrasion, left lower leg, initial encounter: Secondary | ICD-10-CM | POA: Insufficient documentation

## 2024-11-19 DIAGNOSIS — K7210 Chronic hepatic failure without coma: Secondary | ICD-10-CM | POA: Insufficient documentation

## 2024-11-19 DIAGNOSIS — S80811A Abrasion, right lower leg, initial encounter: Secondary | ICD-10-CM | POA: Diagnosis not present

## 2024-11-19 DIAGNOSIS — S0990XA Unspecified injury of head, initial encounter: Secondary | ICD-10-CM | POA: Diagnosis present

## 2024-11-19 DIAGNOSIS — W19XXXA Unspecified fall, initial encounter: Secondary | ICD-10-CM | POA: Diagnosis not present

## 2024-11-19 DIAGNOSIS — Z515 Encounter for palliative care: Secondary | ICD-10-CM | POA: Diagnosis not present

## 2024-11-19 DIAGNOSIS — S06340A Traumatic hemorrhage of right cerebrum without loss of consciousness, initial encounter: Secondary | ICD-10-CM | POA: Insufficient documentation

## 2024-11-19 DIAGNOSIS — S40811A Abrasion of right upper arm, initial encounter: Secondary | ICD-10-CM | POA: Insufficient documentation

## 2024-11-19 DIAGNOSIS — S40812A Abrasion of left upper arm, initial encounter: Secondary | ICD-10-CM | POA: Insufficient documentation

## 2024-11-19 DIAGNOSIS — I629 Nontraumatic intracranial hemorrhage, unspecified: Secondary | ICD-10-CM

## 2024-11-19 LAB — CBC WITH DIFFERENTIAL/PLATELET
Abs Immature Granulocytes: 0.03 K/uL (ref 0.00–0.07)
Basophils Absolute: 0 K/uL (ref 0.0–0.1)
Basophils Relative: 0 %
Eosinophils Absolute: 0 K/uL (ref 0.0–0.5)
Eosinophils Relative: 0 %
HCT: 29.6 % — ABNORMAL LOW (ref 36.0–46.0)
Hemoglobin: 9.9 g/dL — ABNORMAL LOW (ref 12.0–15.0)
Immature Granulocytes: 1 %
Lymphocytes Relative: 9 %
Lymphs Abs: 0.4 K/uL — ABNORMAL LOW (ref 0.7–4.0)
MCH: 34.1 pg — ABNORMAL HIGH (ref 26.0–34.0)
MCHC: 33.4 g/dL (ref 30.0–36.0)
MCV: 102.1 fL — ABNORMAL HIGH (ref 80.0–100.0)
Monocytes Absolute: 0.5 K/uL (ref 0.1–1.0)
Monocytes Relative: 9 %
Neutro Abs: 4.1 K/uL (ref 1.7–7.7)
Neutrophils Relative %: 81 %
Platelets: 55 K/uL — ABNORMAL LOW (ref 150–400)
RBC: 2.9 MIL/uL — ABNORMAL LOW (ref 3.87–5.11)
RDW: 18.5 % — ABNORMAL HIGH (ref 11.5–15.5)
Smear Review: NORMAL
WBC: 5 K/uL (ref 4.0–10.5)
nRBC: 0 % (ref 0.0–0.2)

## 2024-11-19 LAB — COMPREHENSIVE METABOLIC PANEL WITH GFR
ALT: 20 U/L (ref 0–44)
AST: 63 U/L — ABNORMAL HIGH (ref 15–41)
Albumin: 3.6 g/dL (ref 3.5–5.0)
Alkaline Phosphatase: 181 U/L — ABNORMAL HIGH (ref 38–126)
Anion gap: 30 — ABNORMAL HIGH (ref 5–15)
BUN: 5 mg/dL — ABNORMAL LOW (ref 6–20)
CO2: 12 mmol/L — ABNORMAL LOW (ref 22–32)
Calcium: 9.8 mg/dL (ref 8.9–10.3)
Chloride: 92 mmol/L — ABNORMAL LOW (ref 98–111)
Creatinine, Ser: 0.46 mg/dL (ref 0.44–1.00)
GFR, Estimated: >60 mL/min
Glucose, Bld: 179 mg/dL — ABNORMAL HIGH (ref 70–99)
Potassium: 2.3 mmol/L — CL (ref 3.5–5.1)
Sodium: 134 mmol/L — ABNORMAL LOW (ref 135–145)
Total Bilirubin: 13.1 mg/dL — ABNORMAL HIGH (ref 0.0–1.2)
Total Protein: 7.9 g/dL (ref 6.5–8.1)

## 2024-11-19 LAB — URINALYSIS, ROUTINE W REFLEX MICROSCOPIC
Bilirubin Urine: NEGATIVE
Glucose, UA: NEGATIVE mg/dL
Ketones, ur: NEGATIVE mg/dL
Nitrite: NEGATIVE
Protein, ur: NEGATIVE mg/dL
Specific Gravity, Urine: 1.012 (ref 1.005–1.030)
pH: 5 (ref 5.0–8.0)

## 2024-11-19 LAB — PROTIME-INR
INR: 2.9 — ABNORMAL HIGH (ref 0.8–1.2)
Prothrombin Time: 32.1 s — ABNORMAL HIGH (ref 11.4–15.2)

## 2024-11-19 LAB — I-STAT CHEM 8, ED
BUN: 4 mg/dL — ABNORMAL LOW (ref 6–20)
Calcium, Ion: 1.07 mmol/L — ABNORMAL LOW (ref 1.15–1.40)
Chloride: 99 mmol/L (ref 98–111)
Creatinine, Ser: 0.3 mg/dL — ABNORMAL LOW (ref 0.44–1.00)
Glucose, Bld: 176 mg/dL — ABNORMAL HIGH (ref 70–99)
HCT: 30 % — ABNORMAL LOW (ref 36.0–46.0)
Hemoglobin: 10.2 g/dL — ABNORMAL LOW (ref 12.0–15.0)
Potassium: 2.3 mmol/L — CL (ref 3.5–5.1)
Sodium: 135 mmol/L (ref 135–145)
TCO2: 16 mmol/L — ABNORMAL LOW (ref 22–32)

## 2024-11-19 LAB — BLOOD GAS, VENOUS
Acid-base deficit: 8.6 mmol/L — ABNORMAL HIGH (ref 0.0–2.0)
Bicarbonate: 13.6 mmol/L — ABNORMAL LOW (ref 20.0–28.0)
O2 Saturation: 98.3 %
Patient temperature: 37
pCO2, Ven: 21 mmHg — ABNORMAL LOW (ref 44–60)
pH, Ven: 7.42 (ref 7.25–7.43)
pO2, Ven: 85 mmHg — ABNORMAL HIGH (ref 32–45)

## 2024-11-19 LAB — CK: Total CK: 62 U/L (ref 38–234)

## 2024-11-19 LAB — MAGNESIUM: Magnesium: 1.4 mg/dL — ABNORMAL LOW (ref 1.7–2.4)

## 2024-11-19 LAB — I-STAT CG4 LACTIC ACID, ED: Lactic Acid, Venous: 14.7 mmol/L (ref 0.5–1.9)

## 2024-11-19 LAB — AMMONIA: Ammonia: 49 umol/L — ABNORMAL HIGH (ref 9–35)

## 2024-11-19 LAB — HCG, SERUM, QUALITATIVE: Preg, Serum: NEGATIVE

## 2024-11-19 MED ORDER — GLYCOPYRROLATE 0.2 MG/ML IJ SOLN
0.2000 mg | INTRAMUSCULAR | Status: DC | PRN
  Administered 2024-11-19: 0.2 mg via INTRAVENOUS
  Filled 2024-11-19: qty 1

## 2024-11-19 MED ORDER — ONDANSETRON 4 MG PO TBDP: 4.0000 mg | ORAL_TABLET | Freq: Four times a day (QID) | ORAL | Status: DC | PRN

## 2024-11-19 MED ORDER — DEXAMETHASONE SOD PHOSPHATE PF 10 MG/ML IJ SOLN
10.0000 mg | Freq: Once | INTRAMUSCULAR | Status: AC
  Administered 2024-11-19: 10 mg via INTRAVENOUS

## 2024-11-19 MED ORDER — LACTATED RINGERS IV BOLUS (SEPSIS)
1000.0000 mL | Freq: Once | INTRAVENOUS | Status: AC
  Administered 2024-11-19: 1000 mL via INTRAVENOUS

## 2024-11-19 MED ORDER — VANCOMYCIN HCL IN DEXTROSE 1-5 GM/200ML-% IV SOLN
1000.0000 mg | Freq: Once | INTRAVENOUS | Status: AC
  Administered 2024-11-19: 1000 mg via INTRAVENOUS
  Filled 2024-11-19: qty 200

## 2024-11-19 MED ORDER — SODIUM CHLORIDE 0.9 % IV SOLN: INTRAVENOUS | Status: DC

## 2024-11-19 MED ORDER — MAGNESIUM SULFATE 2 GM/50ML IV SOLN
2.0000 g | Freq: Once | INTRAVENOUS | Status: AC
  Administered 2024-11-19: 2 g via INTRAVENOUS
  Filled 2024-11-19: qty 50

## 2024-11-19 MED ORDER — METRONIDAZOLE 500 MG/100ML IV SOLN
500.0000 mg | Freq: Once | INTRAVENOUS | Status: AC
  Administered 2024-11-19: 500 mg via INTRAVENOUS
  Filled 2024-11-19: qty 100

## 2024-11-19 MED ORDER — GLYCOPYRROLATE 0.2 MG/ML IJ SOLN: 0.2000 mg | INTRAMUSCULAR | Status: DC | PRN

## 2024-11-19 MED ORDER — LACTATED RINGERS IV BOLUS (SEPSIS)
500.0000 mL | Freq: Once | INTRAVENOUS | Status: AC
  Administered 2024-11-19: 500 mL via INTRAVENOUS

## 2024-11-19 MED ORDER — IOHEXOL 300 MG/ML  SOLN
75.0000 mL | Freq: Once | INTRAMUSCULAR | Status: AC | PRN
  Administered 2024-11-19: 75 mL via INTRAVENOUS

## 2024-11-19 MED ORDER — POLYVINYL ALCOHOL 1.4 % OP SOLN: 1.0000 [drp] | Freq: Four times a day (QID) | OPHTHALMIC | Status: DC | PRN

## 2024-11-19 MED ORDER — ACETAMINOPHEN 650 MG RE SUPP: 650.0000 mg | Freq: Four times a day (QID) | RECTAL | Status: DC | PRN

## 2024-11-19 MED ORDER — GLYCOPYRROLATE 1 MG PO TABS: 1.0000 mg | ORAL_TABLET | ORAL | Status: DC | PRN

## 2024-11-19 MED ORDER — ACETAMINOPHEN 325 MG PO TABS: 650.0000 mg | ORAL_TABLET | Freq: Four times a day (QID) | ORAL | Status: DC | PRN

## 2024-11-19 MED ORDER — FENTANYL CITRATE (PF) 50 MCG/ML IJ SOSY
25.0000 ug | PREFILLED_SYRINGE | INTRAMUSCULAR | Status: DC | PRN
  Administered 2024-11-19: 50 ug via INTRAVENOUS
  Filled 2024-11-19: qty 1

## 2024-11-19 MED ORDER — ONDANSETRON HCL 4 MG/2ML IJ SOLN
4.0000 mg | Freq: Four times a day (QID) | INTRAMUSCULAR | Status: DC | PRN
  Administered 2024-11-19: 4 mg via INTRAVENOUS
  Filled 2024-11-19: qty 2

## 2024-11-19 MED ORDER — LORAZEPAM 2 MG/ML IJ SOLN
2.0000 mg | INTRAMUSCULAR | Status: DC | PRN
  Administered 2024-11-19: 2 mg via INTRAVENOUS
  Filled 2024-11-19: qty 2

## 2024-11-19 MED ORDER — POTASSIUM CHLORIDE 10 MEQ/100ML IV SOLN
10.0000 meq | INTRAVENOUS | Status: DC
  Administered 2024-11-19: 10 meq via INTRAVENOUS
  Filled 2024-11-19: qty 100

## 2024-11-19 MED ORDER — FENTANYL 2500MCG IN NS 250ML (10MCG/ML) PREMIX INFUSION
0.0000 ug/h | INTRAVENOUS | Status: DC
  Administered 2024-11-19: 50 ug/h via INTRAVENOUS
  Filled 2024-11-19: qty 250

## 2024-11-19 MED ORDER — MIDAZOLAM HCL (PF) 2 MG/2ML IJ SOLN: 2.0000 mg | INTRAMUSCULAR | Status: DC | PRN

## 2024-11-19 MED ORDER — FENTANYL BOLUS VIA INFUSION: 100.0000 ug | INTRAVENOUS | Status: DC | PRN

## 2024-11-19 MED ORDER — LACTATED RINGERS IV SOLN: INTRAVENOUS | Status: DC

## 2024-11-19 MED ORDER — SODIUM CHLORIDE 0.9 % IV SOLN
2.0000 g | Freq: Once | INTRAVENOUS | Status: AC
  Administered 2024-11-19: 2 g via INTRAVENOUS
  Filled 2024-11-19: qty 12.5

## 2024-11-19 MED FILL — Fentanyl Citrate Preservative Free (PF) Inj 2500 MCG/50ML: INTRAMUSCULAR | Qty: 50 | Status: AC

## 2024-11-19 MED FILL — Sodium Chloride IV Soln 0.9%: INTRAVENOUS | Qty: 250 | Status: AC

## 2024-11-19 NOTE — Progress Notes (Signed)
 [facility] Liaison Note  This patient is a current hospice patient with [facility], admitted with a terminal diagnosis of Cirrhosis of Liver. Harlene Bussing [phone] is the patient's primary caregiver.  We will continue to follow for any discharge disposition planning needs and to coordinate continuation of hospice care.  Please don't hesitate to call with any hospice related questions or concerns.  Thank you Inocente Jacobs RN BSN [facility] Liaison [phone]

## 2024-11-19 NOTE — ED Triage Notes (Signed)
 BIB EMS from sisters home. Hospice patient, liver issues. Patient lethargic and jaundiced. Fell this morning, unknown time. Was found by sister on the floor with bruising to forehead. Unknown LOC. Dried/crusty blood on lips and teeth. Happens regularly per patient to EMS. Patient mostly groaning and grunting for RN. Able to open eyes and look at RN per command. CBG 130. 2L [place] at baseline. Family are poor historians per EMS.

## 2024-11-19 NOTE — ED Provider Notes (Signed)
 " [facility] [facility] AT [facility] Provider Note   CSN: [id number] Arrival date & time: 11/19/24  9076     Patient presents with: Fall and Fatigue   Sara [name] is a 48 y.o. female.  Level 5 caveat secondary to altered mental status.  Patient brought in by ambulance from home.  Reported fall this morning.  Patient unable to give any history.  Patient is minimally responsive and unable to supply history or participate in exam.  She has a history of end-stage liver disease and recently moved here from [place] .  Was seen here a week ago and evaluated by palliative.  Unclear CODE STATUS or if on hospice at this time.   The history is provided by the EMS personnel.  Fall This is a new problem.       Prior to Admission medications  Medication Sig Start Date End Date Taking? Authorizing Provider  albuterol  (VENTOLIN  HFA) 108 (90 Base) MCG/ACT inhaler Inhale 2 puffs into the lungs every 6 (six) hours as needed for wheezing or shortness of breath. 06/25/22   Amin, Ankit C, MD  escitalopram  (LEXAPRO ) 10 MG tablet Take 20 mg by mouth daily. 01/16/23 01/15/25  [provider]  escitalopram  (LEXAPRO ) 20 MG tablet Take 1 tablet (20 mg total) by mouth once daily. 09/26/22   Bahraini, Sarah A  fludrocortisone  (FLORINEF ) 0.1 MG tablet Take 1 tablet (0.1 mg total) by mouth daily. Patient not taking: Reported on 08/10/2022 06/26/22   Caleen Burgess BROCKS, MD  folic acid  (FOLVITE ) 1 MG tablet Take 1 tablet (1 mg total) by mouth daily. 06/26/22   Amin, Ankit C, MD  gabapentin  (NEURONTIN ) 300 MG capsule Take 1 capsule (300 mg total) by mouth 3 (three) times daily. 09/26/22 12/25/22  Bahraini, Sarah A  lactulose  (CHRONULAC ) 10 GM/15ML solution Take 30 g by mouth 3 (three) times daily.    [provider]  lactulose , encephalopathy, (CHRONULAC ) 10 GM/15ML SOLN Take 10 g by mouth. 01/15/23 01/15/25  [provider]  LORazepam  (ATIVAN ) 1 MG tablet Take 1 tablet by  mouth every four hours as needed as needed for sleep/anxiety 11/18/24     midodrine  (PROAMATINE ) 10 MG tablet Take 10 mg by mouth 3 (three) times daily.    [provider]  pantoprazole  (PROTONIX ) 40 MG tablet Take 1 tablet (40 mg total) by mouth daily before breakfast. 06/25/22   Amin, Ankit C, MD  QUEtiapine  (SEROQUEL ) 100 MG tablet Take 1 tablet (100 mg total) by mouth at bedtime. 09/26/22 12/25/22  Bahraini, Sarah A  thiamine  (VITAMIN B1) 100 MG tablet Take 1 tablet (100 mg total) by mouth daily. 06/26/22   Amin, Ankit C, MD  topiramate  (TOPAMAX ) 50 MG tablet Take 1 tablet (50 mg total) by mouth 2 (two) times daily. 09/26/22 12/25/22  Bahraini, Sarah A    Allergies: Gluten meal, Morphine  and codeine, and Nsaids    Review of Systems  Unable to perform ROS: Patient unresponsive    Updated Vital Signs BP 128/66   Pulse 88   Temp (!) 93.5 F (34.2 C) (Rectal)   Resp (!) 24   SpO2 100%   Physical Exam Vitals and nursing note reviewed.  Constitutional:      General: She is not in acute distress.    Appearance: She is well-developed.  HENT:     Head: Normocephalic.     Comments: She has a bruise or telangiectasia on her forehead.  There is some dried blood around her  lips and on her teeth.    Mouth/Throat:     Mouth: Mucous membranes are dry.  Eyes:     General: Scleral icterus present.  Neck:     Comments: In cervical collar trach midline Cardiovascular:     Rate and Rhythm: Normal rate and regular rhythm.     Heart sounds: No murmur heard. Pulmonary:     Effort: Pulmonary effort is normal. No respiratory distress.     Breath sounds: Normal breath sounds.  Abdominal:     Palpations: Abdomen is soft.     Tenderness: There is no abdominal tenderness. There is no guarding or rebound.  Musculoskeletal:        General: No deformity.     Comments: She has multiple bruises on her extremities.  Skin:    General: Skin is warm and dry.     Capillary Refill: Capillary refill takes  less than 2 seconds.     Coloration: Skin is jaundiced.  Neurological:     Comments: She is not participating in exam.  She will moan a little bit to palpation.  She is able to minimally move all her extremities.     (all labs ordered are listed, but only abnormal results are displayed) Labs Reviewed  COMPREHENSIVE METABOLIC PANEL WITH GFR - Abnormal; Notable for the following components:      Result Value   Sodium 134 (*)    Potassium 2.3 (*)    Chloride 92 (*)    CO2 12 (*)    Glucose, Bld 179 (*)    BUN 5 (*)    AST 63 (*)    Alkaline Phosphatase 181 (*)    Total Bilirubin 13.1 (*)    Anion gap 30 (*)    All other components within normal limits  CBC WITH DIFFERENTIAL/PLATELET - Abnormal; Notable for the following components:   RBC 2.90 (*)    Hemoglobin 9.9 (*)    HCT 29.6 (*)    MCV 102.1 (*)    MCH 34.1 (*)    RDW 18.5 (*)    Platelets 55 (*)    Lymphs Abs 0.4 (*)    All other components within normal limits  AMMONIA - Abnormal; Notable for the following components:   Ammonia 49 (*)    All other components within normal limits  URINALYSIS, ROUTINE W REFLEX MICROSCOPIC - Abnormal; Notable for the following components:   APPearance HAZY (*)    Hgb urine dipstick SMALL (*)    Leukocytes,Ua SMALL (*)    Bacteria, UA RARE (*)    All other components within normal limits  BLOOD GAS, VENOUS - Abnormal; Notable for the following components:   pCO2, Ven 21 (*)    pO2, Ven 85 (*)    Bicarbonate 13.6 (*)    Acid-base deficit 8.6 (*)    All other components within normal limits  MAGNESIUM  - Abnormal; Notable for the following components:   Magnesium  1.4 (*)    All other components within normal limits  PROTIME-INR - Abnormal; Notable for the following components:   Prothrombin Time 32.1 (*)    INR 2.9 (*)    All other components within normal limits  I-STAT CHEM 8, ED - Abnormal; Notable for the following components:   Potassium 2.3 (*)    BUN 4 (*)    Creatinine,  Ser 0.30 (*)    Glucose, Bld 176 (*)    Calcium , Ion 1.07 (*)    TCO2 16 (*)  Hemoglobin 10.2 (*)    HCT 30.0 (*)    All other components within normal limits  I-STAT CG4 LACTIC ACID, ED - Abnormal; Notable for the following components:   Lactic Acid, Venous 14.7 (*)    All other components within normal limits  CULTURE, BLOOD (ROUTINE X 2)  CULTURE, BLOOD (ROUTINE X 2)  RESP PANEL BY RT-PCR (RSV, FLU A&B, COVID)  RVPGX2  CK  HCG, SERUM, QUALITATIVE  I-STAT CG4 LACTIC ACID, ED    EKG: EKG Interpretation Date/Time:  Tuesday November 19 2024 09:41:24 EST Ventricular Rate:  104 PR Interval:    QRS Duration:  115 QT Interval:  409 QTC Calculation: 442 R Axis:   36  Text Interpretation: Atrial fibrillation vs sinus Paired ventricular premature complexes Nonspecific intraventricular conduction delay new from prior  9/23 Confirmed by Towana Sharper [phone]) on 11/19/2024 10:17:00 AM  Radiology: CT CHEST ABDOMEN PELVIS W CONTRAST Result Date: 11/19/2024 EXAM: CT CHEST, ABDOMEN AND PELVIS WITH CONTRAST 11/19/2024 11:58:34 AM TECHNIQUE: CT of the chest, abdomen and pelvis was performed with the administration of 75 mL of iohexol  (OMNIPAQUE ) 300 MG/ML solution. Multiplanar reformatted images are provided for review. Automated exposure control, iterative reconstruction, and/or weight based adjustment of the mA/kV was utilized to reduce the radiation dose to as low as reasonably achievable. COMPARISON: CT abdomen and pelvis 06/23/2020, CT angiography chest 06/17/2022. CLINICAL HISTORY: Polytrauma, blunt. FINDINGS: CHEST: MEDIASTINUM AND LYMPH NODES: Heart and pericardium are unremarkable. The central airways are clear. No mediastinal, hilar or axillary lymphadenopathy. LUNGS AND PLEURA: No focal consolidation or pulmonary edema. No pleural effusion or pneumothorax. ABDOMEN AND PELVIS: LIVER: Nodular hepatic contour. No hepatic lesion. GALLBLADDER AND BILE DUCTS: Status post cholecystectomy.  Chronic slightly increased in size fluid density lesion measuring 5.1 x 4.2 cm in the expected region of the cystic duct. The finding abuts the proximal pancreas as well as the duodenum. Findings appear to be continuous with the common bile duct and was previously suggested to represent a dilatation of the cystic duct remnant. No biliary ductal dilatation. SPLEEN: Enlarged spleen measuring up to 14 cm. No splenic lesion. PANCREAS: No acute abnormality. ADRENAL GLANDS: No acute abnormality. KIDNEYS, URETERS AND BLADDER: No stones in the kidneys or ureters. No hydronephrosis. No perinephric or periureteral stranding. No filling defects of the partially visualized collecting systems on delayed imaging. Foley catheter tip and inflating balloon terminate within the urinary bladder lumen. GI AND BOWEL: Stomach demonstrates no acute abnormality. No small or large bowel thickening or dilatation. The appendix is unremarkable. Likely limited evaluation of the bowel due to motion artifact. There is no bowel obstruction. REPRODUCTIVE ORGANS: The uterus is unremarkable. No adnexal mass. PERITONEUM AND RETROPERITONEUM: Trace volume of simple free fluid consistent with ascites. No free air. No mesenteric hematoma. VASCULATURE: Aorta is normal in caliber. Recanalized paraumbilical vein. The main portal, splenic, and superior mesenteric veins are patent. Left upper quadrant venous collaterals noted. ABDOMINAL AND PELVIS LYMPH NODES: No lymphadenopathy. BONES AND SOFT TISSUES: No acute displaced rib fracture. No acute displaced fracture or dislocation of the partially visualized shoulders and of the hips. Rotated right hip. No acute displaced fracture or diastasis of the bone to the pelvis. No acute fracture of the sacrum. Old healed nonunionized displaced fractures of the left L3 and L4 transverse processes. Old nondisplaced left L2 transverse process fracture. No acute displaced fracture or malalignment of the thoracolumbar spine.  No focal soft tissue abnormality. IMPRESSION: 1. No acute intrathoracic, intra-abdominal, or intrapelvic abnormality. 2.  No acute fracture. 3. Cirrhosis with portal hypertension. No focal liver lesions identified. Please note that liver protocol enhanced MR and CT are the most sensitive tests for the screening detection of hepatocellular carcinoma in the high risk setting of cirrhosis. 4. Other, non-acute and/or normal findings as above. Electronically signed by: Morgane Naveau MD 11/19/2024 12:44 PM EST RP Workstation: [id number]   DG Chest Port 1 View Result Date: 11/19/2024 EXAM: 1 VIEW(S) XRAY OF THE CHEST 11/19/2024 10:52:00 AM COMPARISON: Chest x-ray 11/11/2024, CT chest 11/19/2024. CLINICAL HISTORY: Fall altered. FINDINGS: LUNGS AND PLEURA: Low lung volume. No focal pulmonary opacity. No pleural effusion. No pneumothorax. HEART AND MEDIASTINUM: No acute abnormality of the cardiac and mediastinal silhouettes. BONES AND SOFT TISSUES: Surgical clips in right upper quadrant, consistent with prior cholecystectomy. No acute osseous abnormality. IMPRESSION: 1. No acute cardiopulmonary abnormality. Electronically signed by: Morgane Naveau MD 11/19/2024 12:35 PM EST RP Workstation: [id number]   CT Head Wo Contrast Result Date: 11/19/2024 EXAM: CT HEAD WITHOUT CONTRAST 11/19/2024 11:58:34 AM TECHNIQUE: CT of the head was performed without the administration of intravenous contrast. Automated exposure control, iterative reconstruction, and/or weight based adjustment of the mA/kV was utilized to reduce the radiation dose to as low as reasonably achievable. COMPARISON: CT head 04/07/2022. CLINICAL HISTORY: Head trauma, abnormal mental status (Age 3-64y). FINDINGS: BRAIN AND VENTRICLES: There is a large parenchymal hematoma in the right cerebral hemisphere centered in the temporoparietal lobes as well as the posterior frontal lobe. The hematoma measures approximately 7.3 x 5.1 x 5.9 cm with an estimated volume of  114 ml. There is surrounding edema. There are multiple areas of hypoattenuation within the hematoma concerning for active extravasation. (Cerebral contusion, intra-axial, intraparenchymal hemorrhage (iph): >7 mm or multiple (high - mbig 3)) Associated mass effect with approximately 9 mm leftward midline shift. (Midline shift > 1mm or edema/effacement of sulci/vents: yes (high - mbig 3)) There is intraventricular extension of blood products. There are areas of layering hemorrhage within the bilateral lateral ventricles. There is significant effacement of the third ventricle. (Intraventricular hemorrhage (ivh): yes (high - mbig 3)) Scattered subarachnoid hemorrhage over the cerebral hemispheres most pronounced over the left cerebral convexity. Additional areas of subarachnoid hemorrhage and subdural hemorrhage within the posterior fossa. (Subarachnoid hemorrhage (sah): mutifocal, bilateral (high - mbig 3)) Small focus of subdural hemorrhage along the left aspect of the falx. (Subdural hematoma (sdh): <4 mm (mbig 1)) There is significant crowding of the basilar cisterns with findings suggestive of right uncal herniation. No evidence of cerebellar herniation on the current study. Scattered cortical calcifications in bilateral cerebral hemispheres. 1.1 x 0.8 cm cortical calcification along left frontal convexity, may represent meningioma. Possible early entrapment of right temporal horn. No evidence of acute infarct. ORBITS: No acute abnormality. SINUSES: No acute abnormality. SOFT TISSUES AND SKULL: No acute soft tissue abnormality. No skull fracture. (Skull fracture: no (low - mbig 1)) Traumatic brain injury risk stratification: Skull fracture: no (low - mbig 1) Subdural hematoma (sdh): <4 mm (mbig 1) Subarachnoid hemorrhage (sah): mutifocal, bilateral (high - mbig 3) Epidural hematoma (edh): no (low - mbig 1) Cerebral contusion, intra-axial, intraparenchymal hemorrhage (iph): >7 mm or multiple (high - mbig 3)  Intraventricular hemorrhage (ivh): yes (high - mbig 3) Midline shift > 1mm or edema/effacement of sulci/vents: yes (high - mbig 3) IMPRESSION: 1. Large right cerebral hemisphere parenchymal hematoma (~ 114 mL) with surrounding edema, suspected active extravasation, and mass effect including 9 mm leftward midline shift. 2. Intraventricular extension with bilateral lateral  ventricular hemorrhage and significant effacement of the third ventricle, with possible early entrapment of the right temporal horn. 3. Scattered subarachnoid hemorrhage over the cerebral hemispheres, most pronounced over the left cerebral convexity, with additional posterior fossa subarachnoid and subdural hemorrhage. Small subdural hemorrhage along the left falx. 4. Significant crowding of the basilar cisterns with findings suggestive of right uncal herniation. No evidence of cerebellar herniation. 5. TBI risk stratification high (MBIG 3). 6. Findings discussed with Dr. Towana at 12:22 PM on 11/19/24. Electronically signed by: Donnice Mania MD 11/19/2024 12:27 PM EST RP Workstation: [id number]   CT Cervical Spine Wo Contrast Result Date: 11/19/2024 EXAM: CT CERVICAL SPINE WITHOUT CONTRAST 11/19/2024 11:58:34 AM TECHNIQUE: CT of the cervical spine was performed without the administration of intravenous contrast. Multiplanar reformatted images are provided for review. Automated exposure control, iterative reconstruction, and/or weight based adjustment of the mA/kV was utilized to reduce the radiation dose to as low as reasonably achievable. COMPARISON: Prior study dated 04/07/2022. CLINICAL HISTORY: Neck trauma, intoxicated or obtunded (Age >= 16y) FINDINGS: BONES AND ALIGNMENT: No acute fracture or traumatic malalignment. There is subtle dextrocurvature of the cervical spine which may be positional. Caries involving left-sided mandibular molars. There is associated sclerosis of the surrounding mandible suggestive of condensing osteitis.  DEGENERATIVE CHANGES: No high-grade osseous spinal canal stenosis. No significant degenerative changes. SOFT TISSUES: No prevertebral soft tissue swelling. IMPRESSION: 1. No evidence of acute traumatic injury. 2. Caries involving left sided mandibular molars with associated sclerosis of the surrounding mandible, suggestive of condensing osteitis. Electronically signed by: Donnice Mania MD 11/19/2024 12:09 PM EST RP Workstation: [id number]     .Critical Care  Performed by: Towana Ozell BROCKS, MD Authorized by: Towana Ozell BROCKS, MD   Critical care provider statement:    Critical care time (minutes):  45   Critical care time was exclusive of:  Separately billable procedures and treating other patients   Critical care was necessary to treat or prevent imminent or life-threatening deterioration of the following conditions:  CNS failure or compromise and trauma   Critical care was time spent personally by me on the following activities:  Development of treatment plan with patient or surrogate, discussions with consultants, evaluation of patient's response to treatment, examination of patient, obtaining history from patient or surrogate, ordering and performing treatments and interventions, ordering and review of laboratory studies, ordering and review of radiographic studies, pulse oximetry, re-evaluation of patient's condition and review of old charts   I assumed direction of critical care for this patient from another provider in my specialty: no      Medications Ordered in the ED  lactated ringers  infusion (has no administration in time range)  potassium chloride  10 mEq in 100 mL IVPB (10 mEq Intravenous Not Given 11/19/24 1453)  0.9 %  sodium chloride  infusion (has no administration in time range)  acetaminophen  (TYLENOL ) tablet 650 mg (has no administration in time range)    Or  acetaminophen  (TYLENOL ) suppository 650 mg (has no administration in time range)  glycopyrrolate  (ROBINUL ) tablet 1 mg  ( Oral See Alternative 11/19/24 1336)    Or  glycopyrrolate  (ROBINUL ) injection 0.2 mg ( Subcutaneous See Alternative 11/19/24 1336)    Or  glycopyrrolate  (ROBINUL ) injection 0.2 mg (0.2 mg Intravenous Given 11/19/24 1336)  artificial tears ophthalmic solution 1 drop (has no administration in time range)  LORazepam  (ATIVAN ) injection 2-4 mg (2 mg Intravenous Given 11/19/24 1344)  ondansetron  (ZOFRAN -ODT) disintegrating tablet 4 mg ( Oral See Alternative 11/19/24 1343)  Or  ondansetron  (ZOFRAN ) injection 4 mg (4 mg Intravenous Given 11/19/24 1343)  fentaNYL  (SUBLIMAZE ) injection 25-100 mcg (50 mcg Intravenous Given 11/19/24 1502)  lactated ringers  bolus 1,000 mL (0 mLs Intravenous Stopped 11/19/24 1113)    And  lactated ringers  bolus 1,000 mL (0 mLs Intravenous Stopped 11/19/24 1113)    And  lactated ringers  bolus 500 mL (0 mLs Intravenous Stopped 11/19/24 1250)  ceFEPIme  (MAXIPIME ) 2 g in sodium chloride  0.9 % 100 mL IVPB (0 g Intravenous Stopped 11/19/24 1053)  metroNIDAZOLE  (FLAGYL ) IVPB 500 mg (0 mg Intravenous Stopped 11/19/24 1152)  vancomycin  (VANCOCIN ) IVPB 1000 mg/200 mL premix (0 mg Intravenous Stopped 11/19/24 1148)  magnesium  sulfate IVPB 2 g 50 mL (0 g Intravenous Stopped 11/19/24 1254)  iohexol  (OMNIPAQUE ) 300 MG/ML solution 75 mL (75 mLs Intravenous Contrast Given 11/19/24 1141)  dexamethasone  (DECADRON ) injection 10 mg (10 mg Intravenous Given 11/19/24 1235)    Clinical Course as of 11/19/24 1626  Tue Nov 19, 2024  1006 Discussed with hospice provider who is on her way here to bedside for further consultation her CODE STATUS and management [MB]  1012 Initial lactate came back at 14.7.  Have activated as code sepsis and ordered fluids and antibiotics. [MB]  1017 Patient hypothermic and Bair hugger applied [MB]  1044 [facility]ice team here.  She confirms the patient was made DNR/DNI yesterday. [MB]  1051 Per hospice nurse patient was awake and able to ambulate  yesterday during home visit [MB]  1111 Chest x-ray interpreted by me as no acute infiltrate.  Awaiting radiology reading. [MB]  1151 Patient with significant intraparenchymal hemorrhage.  Awaiting radiology reading. [MB]  1315 Had a bedside discussion with family.  Daughter is education officer, environmental.  Decision was to elect for CMO.  They do not want to reach out to neurosurgery.  They would like to keep her comfortable. [MB]  1415 Have made patient CMO.  Hospice has arranged for patient to go to [facility].  Family are comfortable plan for discharge by ambulance to be complaints.  DNR has been signed by me. [MB]    Clinical Course User Index [MB] Towana Ozell BROCKS, MD                                 Medical Decision Making Amount and/or Complexity of Data Reviewed Labs: ordered. Radiology: ordered.  Risk OTC drugs. Prescription drug management.   This patient complains of, unresponsive; this involves an extensive number of treatment Options and is a complaint that carries with it a high risk of complications and morbidity. The differential includes bleed, fracture, metabolic derangement, sepsis, shock, stroke  I ordered, reviewed and interpreted labs, which included CBC with chronically low hemoglobin low platelets, chemistries with low potassium low bicarb elevated LFTs, lactate critically elevated, VBG without significant acidosis, INR coagulopathic I ordered medication IV fluids IV antibiotics IV steroids and reviewed PMP when indicated. I ordered imaging studies which included chest x-ray, CT head cervical spine chest abdomen and pelvis and I independently    visualized and interpreted imaging which showed significant intracranial hemorrhage with shift Additional history obtained from patient's family members Previous records obtained and reviewed in epic including recent ED visit I consulted hospice and discussed lab and imaging findings and discussed disposition.  Cardiac monitoring  reviewed, sinus rhythm Social determinants considered, depression Critical Interventions: Workup of altered mental status finding patient with significant intracranial hemorrhage.  Goals of  care discussion and ultimately placed patient on CMO  After the interventions stated above, I reevaluated the patient and found to be more obtunded than when she got here with left eye deviation Admission and further testing considered, patient is being moved to a hospice bed for anticipated death.  Family does not want heroic measures regarding her intracranial bleed or any other further treatment.  They would like her to be kept comfortable.  Hospice is arranged for a bed at [facility].  Transport has been arranged.  Family is in agreement with plan for discharge and transfer to [facility]      Final diagnoses:  Intracranial bleed (HCC)  End stage liver disease (HCC)  Comfort measures only status    ED Discharge Orders     None          Towana Ozell BROCKS, MD 11/19/24 1629  "

## 2024-11-19 NOTE — ED Notes (Signed)
Report called to [facility].  

## 2024-11-19 NOTE — ED Notes (Signed)
"  Bear hugger applied  "

## 2024-11-19 NOTE — Sepsis Progress Note (Signed)
 eLink is following this Code Sepsis.

## 2024-11-21 DEATH — deceased

## 2024-11-22 ENCOUNTER — Encounter: Payer: Self-pay | Admitting: Hematology and Oncology

## 2024-11-24 LAB — CULTURE, BLOOD (ROUTINE X 2)
Culture: NO GROWTH
Culture: NO GROWTH
Special Requests: ADEQUATE
Special Requests: ADEQUATE

## 2024-11-28 ENCOUNTER — Other Ambulatory Visit: Payer: Self-pay
